# Patient Record
Sex: Female | Born: 1935 | Race: Black or African American | Hispanic: No | Smoking: Never smoker
Health system: Southern US, Community
[De-identification: ages and names within clinical notes are randomized; demographics above are authoritative.]

## PROBLEM LIST (undated history)

## (undated) DIAGNOSIS — K2210 Ulcer of esophagus without bleeding: Secondary | ICD-10-CM

## (undated) DIAGNOSIS — G0490 Encephalitis and encephalomyelitis, unspecified: Secondary | ICD-10-CM

## (undated) DIAGNOSIS — A419 Sepsis, unspecified organism: Secondary | ICD-10-CM

## (undated) DIAGNOSIS — K219 Gastro-esophageal reflux disease without esophagitis: Secondary | ICD-10-CM

## (undated) DIAGNOSIS — M1990 Unspecified osteoarthritis, unspecified site: Secondary | ICD-10-CM

## (undated) DIAGNOSIS — K316 Fistula of stomach and duodenum: Secondary | ICD-10-CM

## (undated) DIAGNOSIS — F32A Depression, unspecified: Secondary | ICD-10-CM

## (undated) DIAGNOSIS — A498 Other bacterial infections of unspecified site: Secondary | ICD-10-CM

## (undated) DIAGNOSIS — K209 Esophagitis, unspecified without bleeding: Secondary | ICD-10-CM

## (undated) DIAGNOSIS — R7881 Bacteremia: Secondary | ICD-10-CM

## (undated) DIAGNOSIS — N390 Urinary tract infection, site not specified: Secondary | ICD-10-CM

## (undated) DIAGNOSIS — E871 Hypo-osmolality and hyponatremia: Secondary | ICD-10-CM

## (undated) DIAGNOSIS — D649 Anemia, unspecified: Secondary | ICD-10-CM

## (undated) DIAGNOSIS — G4700 Insomnia, unspecified: Secondary | ICD-10-CM

## (undated) DIAGNOSIS — F0390 Unspecified dementia without behavioral disturbance: Secondary | ICD-10-CM

## (undated) DIAGNOSIS — Z9889 Other specified postprocedural states: Secondary | ICD-10-CM

## (undated) DIAGNOSIS — K449 Diaphragmatic hernia without obstruction or gangrene: Secondary | ICD-10-CM

## (undated) DIAGNOSIS — K859 Acute pancreatitis without necrosis or infection, unspecified: Secondary | ICD-10-CM

## (undated) DIAGNOSIS — J9690 Respiratory failure, unspecified, unspecified whether with hypoxia or hypercapnia: Secondary | ICD-10-CM

## (undated) DIAGNOSIS — K279 Peptic ulcer, site unspecified, unspecified as acute or chronic, without hemorrhage or perforation: Secondary | ICD-10-CM

## (undated) DIAGNOSIS — D1771 Benign lipomatous neoplasm of kidney: Secondary | ICD-10-CM

## (undated) DIAGNOSIS — L0390 Cellulitis, unspecified: Secondary | ICD-10-CM

## (undated) DIAGNOSIS — IMO0001 Reserved for inherently not codable concepts without codable children: Secondary | ICD-10-CM

## (undated) DIAGNOSIS — T148XXA Other injury of unspecified body region, initial encounter: Secondary | ICD-10-CM

## (undated) DIAGNOSIS — F329 Major depressive disorder, single episode, unspecified: Secondary | ICD-10-CM

## (undated) DIAGNOSIS — I10 Essential (primary) hypertension: Secondary | ICD-10-CM

## (undated) DIAGNOSIS — B9681 Helicobacter pylori [H. pylori] as the cause of diseases classified elsewhere: Secondary | ICD-10-CM

## (undated) DIAGNOSIS — M25521 Pain in right elbow: Secondary | ICD-10-CM

## (undated) HISTORY — DX: Reserved for inherently not codable concepts without codable children: IMO0001

## (undated) HISTORY — DX: Peptic ulcer, site unspecified, unspecified as acute or chronic, without hemorrhage or perforation: K27.9

## (undated) HISTORY — DX: Unspecified dementia, unspecified severity, without behavioral disturbance, psychotic disturbance, mood disturbance, and anxiety: F03.90

## (undated) HISTORY — DX: Fistula of stomach and duodenum: K31.6

## (undated) HISTORY — DX: Pain in right elbow: M25.521

## (undated) HISTORY — DX: Gastro-esophageal reflux disease without esophagitis: K21.9

## (undated) HISTORY — DX: Cellulitis, unspecified: L03.90

## (undated) HISTORY — DX: Helicobacter pylori (H. pylori) as the cause of diseases classified elsewhere: B96.81

## (undated) HISTORY — DX: Essential (primary) hypertension: I10

## (undated) HISTORY — PX: UMBILICAL HERNIA REPAIR: SHX196

---

## 1997-06-29 HISTORY — PX: OTHER SURGICAL HISTORY: SHX169

## 2001-01-10 ENCOUNTER — Emergency Department (HOSPITAL_COMMUNITY): Admission: EM | Admit: 2001-01-10 | Discharge: 2001-01-10 | Payer: Self-pay | Admitting: Emergency Medicine

## 2001-01-18 ENCOUNTER — Emergency Department (HOSPITAL_COMMUNITY): Admission: EM | Admit: 2001-01-18 | Discharge: 2001-01-18 | Payer: Self-pay | Admitting: Emergency Medicine

## 2001-01-26 ENCOUNTER — Encounter: Payer: Self-pay | Admitting: Emergency Medicine

## 2001-01-26 ENCOUNTER — Emergency Department (HOSPITAL_COMMUNITY): Admission: EM | Admit: 2001-01-26 | Discharge: 2001-01-26 | Payer: Self-pay | Admitting: Emergency Medicine

## 2001-01-29 ENCOUNTER — Encounter: Payer: Self-pay | Admitting: *Deleted

## 2001-01-29 ENCOUNTER — Emergency Department (HOSPITAL_COMMUNITY): Admission: EM | Admit: 2001-01-29 | Discharge: 2001-01-29 | Payer: Self-pay | Admitting: *Deleted

## 2001-02-12 ENCOUNTER — Emergency Department (HOSPITAL_COMMUNITY): Admission: EM | Admit: 2001-02-12 | Discharge: 2001-02-12 | Payer: Self-pay | Admitting: Emergency Medicine

## 2001-02-24 ENCOUNTER — Ambulatory Visit (HOSPITAL_COMMUNITY): Admission: RE | Admit: 2001-02-24 | Discharge: 2001-02-24 | Payer: Self-pay | Admitting: Family Medicine

## 2001-02-24 ENCOUNTER — Encounter: Payer: Self-pay | Admitting: Family Medicine

## 2001-03-06 ENCOUNTER — Encounter: Payer: Self-pay | Admitting: Family Medicine

## 2001-03-07 ENCOUNTER — Inpatient Hospital Stay (HOSPITAL_COMMUNITY): Admission: AD | Admit: 2001-03-07 | Discharge: 2001-03-09 | Payer: Self-pay | Admitting: Internal Medicine

## 2001-04-13 ENCOUNTER — Inpatient Hospital Stay (HOSPITAL_COMMUNITY): Admission: EM | Admit: 2001-04-13 | Discharge: 2001-05-02 | Payer: Self-pay | Admitting: *Deleted

## 2001-04-13 ENCOUNTER — Encounter: Payer: Self-pay | Admitting: *Deleted

## 2001-04-15 ENCOUNTER — Encounter: Payer: Self-pay | Admitting: Internal Medicine

## 2001-04-16 ENCOUNTER — Encounter: Payer: Self-pay | Admitting: Orthopedic Surgery

## 2001-04-17 ENCOUNTER — Encounter: Payer: Self-pay | Admitting: General Surgery

## 2001-04-19 ENCOUNTER — Encounter: Payer: Self-pay | Admitting: Family Medicine

## 2001-04-20 ENCOUNTER — Encounter: Payer: Self-pay | Admitting: Family Medicine

## 2001-04-21 ENCOUNTER — Encounter: Payer: Self-pay | Admitting: General Surgery

## 2001-04-22 ENCOUNTER — Encounter: Payer: Self-pay | Admitting: General Surgery

## 2001-04-24 ENCOUNTER — Encounter: Payer: Self-pay | Admitting: Internal Medicine

## 2001-04-26 ENCOUNTER — Encounter: Payer: Self-pay | Admitting: Family Medicine

## 2001-04-29 ENCOUNTER — Encounter: Payer: Self-pay | Admitting: Internal Medicine

## 2001-05-02 ENCOUNTER — Inpatient Hospital Stay: Admission: AD | Admit: 2001-05-02 | Discharge: 2001-05-13 | Payer: Self-pay | Admitting: Internal Medicine

## 2001-05-11 ENCOUNTER — Encounter: Payer: Self-pay | Admitting: Family Medicine

## 2001-05-12 ENCOUNTER — Encounter: Payer: Self-pay | Admitting: Family Medicine

## 2001-05-15 ENCOUNTER — Encounter: Payer: Self-pay | Admitting: Emergency Medicine

## 2001-05-15 ENCOUNTER — Emergency Department (HOSPITAL_COMMUNITY): Admission: EM | Admit: 2001-05-15 | Discharge: 2001-05-15 | Payer: Self-pay | Admitting: Emergency Medicine

## 2001-08-02 ENCOUNTER — Ambulatory Visit (HOSPITAL_COMMUNITY): Admission: RE | Admit: 2001-08-02 | Discharge: 2001-08-02 | Payer: Self-pay | Admitting: Orthopaedic Surgery

## 2001-08-02 ENCOUNTER — Encounter: Payer: Self-pay | Admitting: Orthopaedic Surgery

## 2001-10-24 ENCOUNTER — Emergency Department (HOSPITAL_COMMUNITY): Admission: EM | Admit: 2001-10-24 | Discharge: 2001-10-24 | Payer: Self-pay | Admitting: Emergency Medicine

## 2001-11-27 ENCOUNTER — Emergency Department (HOSPITAL_COMMUNITY): Admission: EM | Admit: 2001-11-27 | Discharge: 2001-11-27 | Payer: Self-pay | Admitting: Internal Medicine

## 2002-04-09 ENCOUNTER — Emergency Department (HOSPITAL_COMMUNITY): Admission: EM | Admit: 2002-04-09 | Discharge: 2002-04-09 | Payer: Self-pay | Admitting: *Deleted

## 2002-04-09 ENCOUNTER — Encounter: Payer: Self-pay | Admitting: *Deleted

## 2002-04-10 ENCOUNTER — Emergency Department (HOSPITAL_COMMUNITY): Admission: EM | Admit: 2002-04-10 | Discharge: 2002-04-10 | Payer: Self-pay | Admitting: *Deleted

## 2002-04-10 ENCOUNTER — Encounter: Payer: Self-pay | Admitting: *Deleted

## 2002-09-26 ENCOUNTER — Ambulatory Visit (HOSPITAL_COMMUNITY): Admission: RE | Admit: 2002-09-26 | Discharge: 2002-09-26 | Payer: Self-pay | Admitting: Family Medicine

## 2002-09-26 ENCOUNTER — Encounter: Payer: Self-pay | Admitting: Family Medicine

## 2002-10-31 ENCOUNTER — Encounter: Payer: Self-pay | Admitting: Family Medicine

## 2002-10-31 ENCOUNTER — Ambulatory Visit (HOSPITAL_COMMUNITY): Admission: RE | Admit: 2002-10-31 | Discharge: 2002-10-31 | Payer: Self-pay | Admitting: Family Medicine

## 2003-03-23 ENCOUNTER — Other Ambulatory Visit: Admission: RE | Admit: 2003-03-23 | Discharge: 2003-03-23 | Payer: Self-pay | Admitting: Obstetrics & Gynecology

## 2003-05-10 ENCOUNTER — Ambulatory Visit (HOSPITAL_COMMUNITY): Admission: RE | Admit: 2003-05-10 | Discharge: 2003-05-10 | Payer: Self-pay | Admitting: Obstetrics & Gynecology

## 2003-10-19 ENCOUNTER — Ambulatory Visit (HOSPITAL_COMMUNITY): Admission: RE | Admit: 2003-10-19 | Discharge: 2003-10-19 | Payer: Self-pay | Admitting: Family Medicine

## 2003-11-30 ENCOUNTER — Ambulatory Visit (HOSPITAL_COMMUNITY): Admission: RE | Admit: 2003-11-30 | Discharge: 2003-11-30 | Payer: Self-pay | Admitting: *Deleted

## 2004-01-01 ENCOUNTER — Ambulatory Visit (HOSPITAL_COMMUNITY): Admission: RE | Admit: 2004-01-01 | Discharge: 2004-01-01 | Payer: Self-pay | Admitting: Family Medicine

## 2004-01-08 ENCOUNTER — Ambulatory Visit (HOSPITAL_COMMUNITY): Admission: RE | Admit: 2004-01-08 | Discharge: 2004-01-08 | Payer: Self-pay | Admitting: *Deleted

## 2004-01-09 ENCOUNTER — Ambulatory Visit (HOSPITAL_COMMUNITY): Admission: RE | Admit: 2004-01-09 | Discharge: 2004-01-09 | Payer: Self-pay | Admitting: Family Medicine

## 2004-03-12 ENCOUNTER — Inpatient Hospital Stay (HOSPITAL_COMMUNITY): Admission: EM | Admit: 2004-03-12 | Discharge: 2004-03-13 | Payer: Self-pay | Admitting: *Deleted

## 2004-03-31 ENCOUNTER — Ambulatory Visit: Payer: Self-pay | Admitting: *Deleted

## 2004-04-30 ENCOUNTER — Ambulatory Visit: Payer: Self-pay | Admitting: Family Medicine

## 2004-05-07 ENCOUNTER — Ambulatory Visit: Payer: Self-pay | Admitting: Family Medicine

## 2004-08-01 ENCOUNTER — Ambulatory Visit: Payer: Self-pay | Admitting: Family Medicine

## 2004-08-18 ENCOUNTER — Ambulatory Visit (HOSPITAL_COMMUNITY): Admission: RE | Admit: 2004-08-18 | Discharge: 2004-08-18 | Payer: Self-pay | Admitting: Ophthalmology

## 2004-08-29 ENCOUNTER — Ambulatory Visit: Payer: Self-pay | Admitting: Family Medicine

## 2004-11-10 ENCOUNTER — Ambulatory Visit: Payer: Self-pay | Admitting: Family Medicine

## 2004-12-15 ENCOUNTER — Ambulatory Visit (HOSPITAL_COMMUNITY): Admission: RE | Admit: 2004-12-15 | Discharge: 2004-12-15 | Payer: Self-pay | Admitting: Ophthalmology

## 2005-01-01 ENCOUNTER — Ambulatory Visit: Payer: Self-pay | Admitting: Family Medicine

## 2005-01-09 ENCOUNTER — Ambulatory Visit (HOSPITAL_COMMUNITY): Admission: RE | Admit: 2005-01-09 | Discharge: 2005-01-09 | Payer: Self-pay | Admitting: Family Medicine

## 2005-03-09 ENCOUNTER — Ambulatory Visit: Payer: Self-pay | Admitting: Family Medicine

## 2005-04-16 ENCOUNTER — Ambulatory Visit: Payer: Self-pay | Admitting: Family Medicine

## 2005-04-30 ENCOUNTER — Ambulatory Visit: Payer: Self-pay | Admitting: Family Medicine

## 2005-07-29 ENCOUNTER — Ambulatory Visit: Payer: Self-pay | Admitting: Family Medicine

## 2005-08-07 ENCOUNTER — Ambulatory Visit (HOSPITAL_COMMUNITY): Admission: RE | Admit: 2005-08-07 | Discharge: 2005-08-07 | Payer: Self-pay | Admitting: Family Medicine

## 2005-08-21 ENCOUNTER — Other Ambulatory Visit: Admission: RE | Admit: 2005-08-21 | Discharge: 2005-08-21 | Payer: Self-pay | Admitting: Obstetrics & Gynecology

## 2005-08-26 ENCOUNTER — Ambulatory Visit: Payer: Self-pay | Admitting: Family Medicine

## 2005-11-26 ENCOUNTER — Ambulatory Visit: Payer: Self-pay | Admitting: Family Medicine

## 2005-12-02 ENCOUNTER — Ambulatory Visit: Payer: Self-pay | Admitting: Family Medicine

## 2006-01-07 ENCOUNTER — Ambulatory Visit: Payer: Self-pay | Admitting: Family Medicine

## 2006-01-28 ENCOUNTER — Ambulatory Visit (HOSPITAL_COMMUNITY): Admission: RE | Admit: 2006-01-28 | Discharge: 2006-01-28 | Payer: Self-pay | Admitting: Family Medicine

## 2006-04-01 ENCOUNTER — Ambulatory Visit: Payer: Self-pay | Admitting: Family Medicine

## 2006-05-04 ENCOUNTER — Other Ambulatory Visit: Admission: RE | Admit: 2006-05-04 | Discharge: 2006-05-04 | Payer: Self-pay | Admitting: Family Medicine

## 2006-05-04 ENCOUNTER — Ambulatory Visit: Payer: Self-pay | Admitting: Family Medicine

## 2006-05-04 ENCOUNTER — Encounter: Payer: Self-pay | Admitting: Family Medicine

## 2006-05-04 ENCOUNTER — Encounter (INDEPENDENT_AMBULATORY_CARE_PROVIDER_SITE_OTHER): Payer: Self-pay | Admitting: *Deleted

## 2006-05-04 LAB — CONVERTED CEMR LAB: Pap Smear: NORMAL

## 2006-06-03 ENCOUNTER — Ambulatory Visit: Payer: Self-pay | Admitting: Family Medicine

## 2006-09-02 ENCOUNTER — Ambulatory Visit: Payer: Self-pay | Admitting: Family Medicine

## 2006-09-02 LAB — CONVERTED CEMR LAB
BUN: 20 mg/dL (ref 6–23)
Calcium: 9.3 mg/dL (ref 8.4–10.5)
Creatinine, Ser: 1.25 mg/dL — ABNORMAL HIGH (ref 0.40–1.20)
Hgb A1c MFr Bld: 7.2 % — ABNORMAL HIGH (ref 4.6–6.1)

## 2006-09-06 ENCOUNTER — Encounter: Payer: Self-pay | Admitting: Family Medicine

## 2006-09-06 LAB — CONVERTED CEMR LAB: Potassium: 4.2 meq/L (ref 3.5–5.3)

## 2006-09-13 ENCOUNTER — Ambulatory Visit: Payer: Self-pay | Admitting: Family Medicine

## 2006-09-15 ENCOUNTER — Ambulatory Visit (HOSPITAL_COMMUNITY): Admission: RE | Admit: 2006-09-15 | Discharge: 2006-09-15 | Payer: Self-pay | Admitting: Family Medicine

## 2006-10-04 ENCOUNTER — Ambulatory Visit: Payer: Self-pay | Admitting: Family Medicine

## 2006-10-13 ENCOUNTER — Ambulatory Visit (HOSPITAL_COMMUNITY): Admission: RE | Admit: 2006-10-13 | Discharge: 2006-10-13 | Payer: Self-pay | Admitting: Family Medicine

## 2006-11-17 ENCOUNTER — Ambulatory Visit: Payer: Self-pay | Admitting: Family Medicine

## 2006-12-03 ENCOUNTER — Ambulatory Visit: Payer: Self-pay | Admitting: Family Medicine

## 2006-12-15 ENCOUNTER — Ambulatory Visit (HOSPITAL_COMMUNITY): Admission: RE | Admit: 2006-12-15 | Discharge: 2006-12-15 | Payer: Self-pay | Admitting: Orthopaedic Surgery

## 2007-01-03 ENCOUNTER — Ambulatory Visit: Payer: Self-pay | Admitting: Family Medicine

## 2007-01-04 ENCOUNTER — Encounter: Payer: Self-pay | Admitting: Family Medicine

## 2007-01-06 ENCOUNTER — Encounter: Payer: Self-pay | Admitting: Family Medicine

## 2007-01-06 LAB — CONVERTED CEMR LAB
Bilirubin, Direct: 0.1 mg/dL (ref 0.0–0.3)
Calcium: 9.2 mg/dL (ref 8.4–10.5)
Creatinine, Ser: 1.21 mg/dL — ABNORMAL HIGH (ref 0.40–1.20)
Indirect Bilirubin: 0.2 mg/dL (ref 0.0–0.9)
Total Bilirubin: 0.3 mg/dL (ref 0.3–1.2)
Total Protein: 6.8 g/dL (ref 6.0–8.3)

## 2007-03-01 ENCOUNTER — Ambulatory Visit (HOSPITAL_COMMUNITY): Admission: RE | Admit: 2007-03-01 | Discharge: 2007-03-01 | Payer: Self-pay | Admitting: Family Medicine

## 2007-04-14 ENCOUNTER — Ambulatory Visit: Payer: Self-pay | Admitting: Family Medicine

## 2007-05-09 ENCOUNTER — Ambulatory Visit: Payer: Self-pay | Admitting: Family Medicine

## 2007-05-11 ENCOUNTER — Ambulatory Visit (HOSPITAL_COMMUNITY): Admission: RE | Admit: 2007-05-11 | Discharge: 2007-05-11 | Payer: Self-pay | Admitting: Family Medicine

## 2007-05-24 ENCOUNTER — Encounter: Payer: Self-pay | Admitting: Family Medicine

## 2007-05-24 LAB — CONVERTED CEMR LAB
Alkaline Phosphatase: 57 units/L (ref 39–117)
BUN: 19 mg/dL (ref 6–23)
Basophils Relative: 0 % (ref 0–1)
Bilirubin, Direct: 0.1 mg/dL (ref 0.0–0.3)
CO2: 22 meq/L (ref 19–32)
Chloride: 105 meq/L (ref 96–112)
Creatinine, Ser: 1.02 mg/dL (ref 0.40–1.20)
Hemoglobin: 12 g/dL (ref 12.0–15.0)
Indirect Bilirubin: 0.2 mg/dL (ref 0.0–0.9)
LDL Cholesterol: 23 mg/dL (ref 0–99)
MCHC: 30.8 g/dL (ref 30.0–36.0)
Monocytes Absolute: 0.4 10*3/uL (ref 0.1–1.0)
Monocytes Relative: 8 % (ref 3–12)
Neutro Abs: 2 10*3/uL (ref 1.7–7.7)
RBC: 4.23 M/uL (ref 3.87–5.11)
Total Bilirubin: 0.3 mg/dL (ref 0.3–1.2)
Triglycerides: 109 mg/dL (ref <150)

## 2007-06-13 ENCOUNTER — Ambulatory Visit: Payer: Self-pay | Admitting: Family Medicine

## 2007-06-30 DIAGNOSIS — Z9889 Other specified postprocedural states: Secondary | ICD-10-CM

## 2007-06-30 HISTORY — DX: Other specified postprocedural states: Z98.890

## 2007-07-08 ENCOUNTER — Ambulatory Visit (HOSPITAL_COMMUNITY): Admission: RE | Admit: 2007-07-08 | Discharge: 2007-07-08 | Payer: Self-pay | Admitting: Cardiology

## 2007-07-08 ENCOUNTER — Ambulatory Visit: Payer: Self-pay | Admitting: Cardiology

## 2007-07-12 ENCOUNTER — Ambulatory Visit: Payer: Self-pay | Admitting: Family Medicine

## 2007-08-22 ENCOUNTER — Ambulatory Visit: Payer: Self-pay | Admitting: Cardiology

## 2007-08-22 ENCOUNTER — Ambulatory Visit (HOSPITAL_COMMUNITY): Admission: RE | Admit: 2007-08-22 | Discharge: 2007-08-22 | Payer: Self-pay | Admitting: Cardiology

## 2007-08-30 ENCOUNTER — Ambulatory Visit: Payer: Self-pay | Admitting: Family Medicine

## 2007-09-01 ENCOUNTER — Encounter: Payer: Self-pay | Admitting: Family Medicine

## 2007-09-01 LAB — CONVERTED CEMR LAB
Albumin: 4.1 g/dL (ref 3.5–5.2)
Bilirubin, Direct: <0.1 mg/dL (ref 0.0–0.3)
CO2: 20 meq/L (ref 19–32)
Chloride: 104 meq/L (ref 96–112)
Glucose, Bld: 178 mg/dL — ABNORMAL HIGH (ref 70–99)
Potassium: 5.3 meq/L (ref 3.5–5.3)
Sodium: 137 meq/L (ref 135–145)
TSH: 1.372 microintl units/mL (ref 0.350–5.50)
Total Bilirubin: 0.3 mg/dL (ref 0.3–1.2)
Total CHOL/HDL Ratio: 2.3
VLDL: 20 mg/dL (ref 0–40)

## 2007-09-12 ENCOUNTER — Ambulatory Visit: Payer: Self-pay | Admitting: Cardiology

## 2007-09-13 ENCOUNTER — Encounter (INDEPENDENT_AMBULATORY_CARE_PROVIDER_SITE_OTHER): Payer: Self-pay | Admitting: *Deleted

## 2007-09-13 DIAGNOSIS — F3289 Other specified depressive episodes: Secondary | ICD-10-CM | POA: Insufficient documentation

## 2007-09-13 DIAGNOSIS — Z87898 Personal history of other specified conditions: Secondary | ICD-10-CM

## 2007-09-13 DIAGNOSIS — G4700 Insomnia, unspecified: Secondary | ICD-10-CM

## 2007-09-13 DIAGNOSIS — F329 Major depressive disorder, single episode, unspecified: Secondary | ICD-10-CM

## 2007-09-13 DIAGNOSIS — J301 Allergic rhinitis due to pollen: Secondary | ICD-10-CM

## 2007-09-13 DIAGNOSIS — E119 Type 2 diabetes mellitus without complications: Secondary | ICD-10-CM

## 2007-09-13 DIAGNOSIS — I10 Essential (primary) hypertension: Secondary | ICD-10-CM | POA: Insufficient documentation

## 2007-09-13 DIAGNOSIS — E785 Hyperlipidemia, unspecified: Secondary | ICD-10-CM | POA: Insufficient documentation

## 2007-10-18 ENCOUNTER — Ambulatory Visit: Payer: Self-pay | Admitting: Family Medicine

## 2007-11-03 ENCOUNTER — Emergency Department (HOSPITAL_COMMUNITY): Admission: EM | Admit: 2007-11-03 | Discharge: 2007-11-03 | Payer: Self-pay | Admitting: Emergency Medicine

## 2007-11-07 ENCOUNTER — Ambulatory Visit: Payer: Self-pay | Admitting: Orthopedic Surgery

## 2007-11-07 DIAGNOSIS — S52023A Displaced fracture of olecranon process without intraarticular extension of unspecified ulna, initial encounter for closed fracture: Secondary | ICD-10-CM

## 2007-11-10 ENCOUNTER — Ambulatory Visit (HOSPITAL_COMMUNITY): Admission: RE | Admit: 2007-11-10 | Discharge: 2007-11-10 | Payer: Self-pay | Admitting: Orthopedic Surgery

## 2007-11-10 ENCOUNTER — Ambulatory Visit: Payer: Self-pay | Admitting: Orthopedic Surgery

## 2007-11-14 ENCOUNTER — Emergency Department (HOSPITAL_COMMUNITY): Admission: EM | Admit: 2007-11-14 | Discharge: 2007-11-14 | Payer: Self-pay | Admitting: Emergency Medicine

## 2007-11-15 ENCOUNTER — Ambulatory Visit: Payer: Self-pay | Admitting: Family Medicine

## 2007-11-16 ENCOUNTER — Ambulatory Visit: Payer: Self-pay | Admitting: Orthopedic Surgery

## 2007-11-19 ENCOUNTER — Emergency Department (HOSPITAL_COMMUNITY): Admission: EM | Admit: 2007-11-19 | Discharge: 2007-11-19 | Payer: Self-pay | Admitting: Emergency Medicine

## 2007-11-22 ENCOUNTER — Ambulatory Visit (HOSPITAL_COMMUNITY): Admission: RE | Admit: 2007-11-22 | Discharge: 2007-11-22 | Payer: Self-pay | Admitting: General Surgery

## 2007-11-24 ENCOUNTER — Ambulatory Visit: Payer: Self-pay | Admitting: Orthopedic Surgery

## 2007-11-25 ENCOUNTER — Emergency Department (HOSPITAL_COMMUNITY): Admission: EM | Admit: 2007-11-25 | Discharge: 2007-11-26 | Payer: Self-pay | Admitting: Emergency Medicine

## 2007-11-28 ENCOUNTER — Ambulatory Visit: Payer: Self-pay | Admitting: Family Medicine

## 2007-11-28 ENCOUNTER — Encounter: Payer: Self-pay | Admitting: Family Medicine

## 2007-11-28 DIAGNOSIS — R269 Unspecified abnormalities of gait and mobility: Secondary | ICD-10-CM

## 2007-11-28 DIAGNOSIS — K859 Acute pancreatitis without necrosis or infection, unspecified: Secondary | ICD-10-CM

## 2007-11-28 DIAGNOSIS — K5730 Diverticulosis of large intestine without perforation or abscess without bleeding: Secondary | ICD-10-CM | POA: Insufficient documentation

## 2007-11-28 HISTORY — DX: Acute pancreatitis without necrosis or infection, unspecified: K85.90

## 2007-11-29 ENCOUNTER — Encounter: Payer: Self-pay | Admitting: Orthopedic Surgery

## 2007-11-30 ENCOUNTER — Ambulatory Visit: Payer: Self-pay | Admitting: Orthopedic Surgery

## 2007-12-01 ENCOUNTER — Emergency Department (HOSPITAL_COMMUNITY): Admission: EM | Admit: 2007-12-01 | Discharge: 2007-12-01 | Payer: Self-pay | Admitting: Emergency Medicine

## 2007-12-02 ENCOUNTER — Other Ambulatory Visit: Payer: Self-pay | Admitting: Orthopedic Surgery

## 2007-12-02 ENCOUNTER — Inpatient Hospital Stay (HOSPITAL_COMMUNITY): Admission: EM | Admit: 2007-12-02 | Discharge: 2007-12-12 | Payer: Self-pay | Admitting: Emergency Medicine

## 2007-12-02 ENCOUNTER — Emergency Department (HOSPITAL_COMMUNITY): Admission: EM | Admit: 2007-12-02 | Discharge: 2007-12-02 | Payer: Self-pay | Admitting: Emergency Medicine

## 2007-12-04 ENCOUNTER — Ambulatory Visit: Payer: Self-pay | Admitting: Internal Medicine

## 2007-12-05 ENCOUNTER — Ambulatory Visit: Payer: Self-pay | Admitting: Internal Medicine

## 2007-12-05 ENCOUNTER — Encounter: Payer: Self-pay | Admitting: Internal Medicine

## 2007-12-05 DIAGNOSIS — K2210 Ulcer of esophagus without bleeding: Secondary | ICD-10-CM

## 2007-12-05 HISTORY — DX: Ulcer of esophagus without bleeding: K22.10

## 2007-12-06 ENCOUNTER — Ambulatory Visit: Payer: Self-pay | Admitting: Internal Medicine

## 2007-12-08 ENCOUNTER — Telehealth: Payer: Self-pay | Admitting: Orthopedic Surgery

## 2007-12-08 ENCOUNTER — Encounter: Payer: Self-pay | Admitting: Orthopedic Surgery

## 2007-12-08 ENCOUNTER — Ambulatory Visit: Payer: Self-pay | Admitting: Family Medicine

## 2007-12-09 ENCOUNTER — Ambulatory Visit: Payer: Self-pay | Admitting: Orthopedic Surgery

## 2007-12-12 ENCOUNTER — Encounter: Payer: Self-pay | Admitting: Orthopedic Surgery

## 2007-12-21 ENCOUNTER — Ambulatory Visit: Payer: Self-pay | Admitting: Orthopedic Surgery

## 2007-12-21 ENCOUNTER — Ambulatory Visit: Payer: Self-pay | Admitting: Family Medicine

## 2008-01-02 ENCOUNTER — Telehealth: Payer: Self-pay | Admitting: Orthopedic Surgery

## 2008-01-18 ENCOUNTER — Ambulatory Visit: Payer: Self-pay | Admitting: Orthopedic Surgery

## 2008-01-20 ENCOUNTER — Telehealth: Payer: Self-pay | Admitting: Orthopedic Surgery

## 2008-01-23 ENCOUNTER — Telehealth: Payer: Self-pay | Admitting: Family Medicine

## 2008-01-27 ENCOUNTER — Ambulatory Visit: Payer: Self-pay | Admitting: Family Medicine

## 2008-01-30 ENCOUNTER — Telehealth: Payer: Self-pay | Admitting: Orthopedic Surgery

## 2008-01-31 ENCOUNTER — Encounter: Payer: Self-pay | Admitting: Orthopedic Surgery

## 2008-01-31 ENCOUNTER — Telehealth: Payer: Self-pay | Admitting: Family Medicine

## 2008-02-07 ENCOUNTER — Telehealth: Payer: Self-pay | Admitting: Family Medicine

## 2008-02-11 ENCOUNTER — Emergency Department (HOSPITAL_COMMUNITY): Admission: EM | Admit: 2008-02-11 | Discharge: 2008-02-11 | Payer: Self-pay | Admitting: Emergency Medicine

## 2008-02-13 ENCOUNTER — Emergency Department (HOSPITAL_COMMUNITY): Admission: EM | Admit: 2008-02-13 | Discharge: 2008-02-13 | Payer: Self-pay | Admitting: Emergency Medicine

## 2008-02-14 ENCOUNTER — Ambulatory Visit: Payer: Self-pay | Admitting: Family Medicine

## 2008-02-14 ENCOUNTER — Inpatient Hospital Stay (HOSPITAL_COMMUNITY): Admission: EM | Admit: 2008-02-14 | Discharge: 2008-02-19 | Payer: Self-pay | Admitting: Emergency Medicine

## 2008-02-16 ENCOUNTER — Ambulatory Visit: Payer: Self-pay | Admitting: Gastroenterology

## 2008-02-20 ENCOUNTER — Encounter: Payer: Self-pay | Admitting: Family Medicine

## 2008-02-21 ENCOUNTER — Encounter: Payer: Self-pay | Admitting: Family Medicine

## 2008-02-22 ENCOUNTER — Ambulatory Visit: Payer: Self-pay | Admitting: Family Medicine

## 2008-02-22 LAB — CONVERTED CEMR LAB
Glucose, Bld: 266 mg/dL
Hgb A1c MFr Bld: 6.2 %

## 2008-02-24 ENCOUNTER — Encounter: Payer: Self-pay | Admitting: Family Medicine

## 2008-02-27 ENCOUNTER — Encounter: Payer: Self-pay | Admitting: Family Medicine

## 2008-02-28 ENCOUNTER — Encounter: Payer: Self-pay | Admitting: Family Medicine

## 2008-02-29 ENCOUNTER — Telehealth: Payer: Self-pay | Admitting: Family Medicine

## 2008-02-29 ENCOUNTER — Encounter: Payer: Self-pay | Admitting: Family Medicine

## 2008-03-01 ENCOUNTER — Telehealth: Payer: Self-pay | Admitting: Family Medicine

## 2008-03-02 ENCOUNTER — Emergency Department (HOSPITAL_COMMUNITY): Admission: EM | Admit: 2008-03-02 | Discharge: 2008-03-02 | Payer: Self-pay | Admitting: Emergency Medicine

## 2008-03-06 ENCOUNTER — Telehealth: Payer: Self-pay | Admitting: Family Medicine

## 2008-03-07 ENCOUNTER — Encounter: Payer: Self-pay | Admitting: Family Medicine

## 2008-03-13 ENCOUNTER — Encounter: Payer: Self-pay | Admitting: Family Medicine

## 2008-03-15 ENCOUNTER — Telehealth: Payer: Self-pay | Admitting: Family Medicine

## 2008-03-15 ENCOUNTER — Encounter: Payer: Self-pay | Admitting: Family Medicine

## 2008-03-16 ENCOUNTER — Ambulatory Visit: Payer: Self-pay | Admitting: Cardiology

## 2008-03-16 ENCOUNTER — Encounter: Payer: Self-pay | Admitting: Family Medicine

## 2008-03-23 ENCOUNTER — Encounter: Payer: Self-pay | Admitting: Family Medicine

## 2008-03-26 ENCOUNTER — Encounter: Payer: Self-pay | Admitting: Family Medicine

## 2008-03-28 ENCOUNTER — Ambulatory Visit: Payer: Self-pay | Admitting: Orthopedic Surgery

## 2008-03-29 ENCOUNTER — Telehealth: Payer: Self-pay | Admitting: Family Medicine

## 2008-03-29 ENCOUNTER — Ambulatory Visit (HOSPITAL_COMMUNITY): Admission: RE | Admit: 2008-03-29 | Discharge: 2008-03-29 | Payer: Self-pay | Admitting: Family Medicine

## 2008-04-17 ENCOUNTER — Encounter: Payer: Self-pay | Admitting: Family Medicine

## 2008-04-22 ENCOUNTER — Emergency Department (HOSPITAL_COMMUNITY): Admission: EM | Admit: 2008-04-22 | Discharge: 2008-04-22 | Payer: Self-pay | Admitting: Emergency Medicine

## 2008-04-23 ENCOUNTER — Ambulatory Visit: Payer: Self-pay | Admitting: Family Medicine

## 2008-04-23 DIAGNOSIS — N3000 Acute cystitis without hematuria: Secondary | ICD-10-CM

## 2008-04-24 ENCOUNTER — Encounter: Payer: Self-pay | Admitting: Family Medicine

## 2008-05-01 ENCOUNTER — Encounter: Payer: Self-pay | Admitting: Family Medicine

## 2008-05-03 ENCOUNTER — Ambulatory Visit: Payer: Self-pay | Admitting: Family Medicine

## 2008-05-15 ENCOUNTER — Encounter: Payer: Self-pay | Admitting: Family Medicine

## 2008-05-23 ENCOUNTER — Ambulatory Visit: Payer: Self-pay | Admitting: Cardiology

## 2008-05-30 ENCOUNTER — Telehealth: Payer: Self-pay | Admitting: Family Medicine

## 2008-05-31 ENCOUNTER — Ambulatory Visit: Payer: Self-pay | Admitting: Family Medicine

## 2008-05-31 LAB — CONVERTED CEMR LAB
Nitrite: NEGATIVE
Protein, U semiquant: NEGATIVE
Specific Gravity, Urine: 1.015
Urobilinogen, UA: 0.2

## 2008-06-01 ENCOUNTER — Encounter: Payer: Self-pay | Admitting: Family Medicine

## 2008-06-01 LAB — CONVERTED CEMR LAB
Bilirubin Urine: NEGATIVE
Leukocytes, UA: NEGATIVE
Protein, ur: NEGATIVE mg/dL
Urine Glucose: NEGATIVE mg/dL
pH: 6 (ref 5.0–8.0)

## 2008-06-05 ENCOUNTER — Encounter: Payer: Self-pay | Admitting: Family Medicine

## 2008-06-05 ENCOUNTER — Telehealth: Payer: Self-pay | Admitting: Family Medicine

## 2008-06-15 ENCOUNTER — Encounter: Payer: Self-pay | Admitting: Family Medicine

## 2008-06-19 ENCOUNTER — Encounter: Payer: Self-pay | Admitting: Family Medicine

## 2008-06-25 ENCOUNTER — Encounter: Payer: Self-pay | Admitting: Family Medicine

## 2008-06-28 ENCOUNTER — Ambulatory Visit: Payer: Self-pay | Admitting: Family Medicine

## 2008-07-02 ENCOUNTER — Encounter: Payer: Self-pay | Admitting: Family Medicine

## 2008-07-10 ENCOUNTER — Ambulatory Visit: Payer: Self-pay | Admitting: Family Medicine

## 2008-07-10 LAB — CONVERTED CEMR LAB
Bilirubin Urine: NEGATIVE
Glucose, Bld: 327 mg/dL
Glucose, Urine, Semiquant: 500
Hgb A1c MFr Bld: 6.6 %
Specific Gravity, Urine: 1.015
Urobilinogen, UA: 0.2
pH: 6

## 2008-07-11 ENCOUNTER — Encounter: Payer: Self-pay | Admitting: Family Medicine

## 2008-07-11 LAB — CONVERTED CEMR LAB
Creatinine, Urine: 119.5 mg/dL
Microalb, Ur: 4.4 mg/dL — ABNORMAL HIGH (ref 0.00–1.89)

## 2008-07-12 ENCOUNTER — Encounter: Payer: Self-pay | Admitting: Family Medicine

## 2008-07-12 DIAGNOSIS — N39498 Other specified urinary incontinence: Secondary | ICD-10-CM

## 2008-07-16 LAB — CONVERTED CEMR LAB
ALT: 9 units/L (ref 0–35)
Albumin: 4.4 g/dL (ref 3.5–5.2)
Alkaline Phosphatase: 103 units/L (ref 39–117)
BUN: 14 mg/dL (ref 6–23)
Basophils Absolute: 0 10*3/uL (ref 0.0–0.1)
Basophils Relative: 1 % (ref 0–1)
Chloride: 102 meq/L (ref 96–112)
Cholesterol: 206 mg/dL — ABNORMAL HIGH (ref 0–200)
Eosinophils Absolute: 0.1 10*3/uL (ref 0.0–0.7)
HDL: 61 mg/dL (ref >39)
LDL Cholesterol: 123 mg/dL — ABNORMAL HIGH (ref 0–99)
MCHC: 30.6 g/dL (ref 30.0–36.0)
MCV: 93.8 fL (ref 78.0–100.0)
Monocytes Relative: 6 % (ref 3–12)
Neutro Abs: 1.9 10*3/uL (ref 1.7–7.7)
Neutrophils Relative %: 46 % (ref 43–77)
Platelets: 303 10*3/uL (ref 150–400)
Potassium: 4.8 meq/L (ref 3.5–5.3)
RDW: 13.7 % (ref 11.5–15.5)
Total Protein: 7.2 g/dL (ref 6.0–8.3)
Triglycerides: 111 mg/dL (ref <150)
VLDL: 22 mg/dL (ref 0–40)

## 2008-07-19 ENCOUNTER — Encounter: Payer: Self-pay | Admitting: Family Medicine

## 2008-07-21 ENCOUNTER — Emergency Department (HOSPITAL_COMMUNITY): Admission: EM | Admit: 2008-07-21 | Discharge: 2008-07-21 | Payer: Self-pay | Admitting: Emergency Medicine

## 2008-07-23 ENCOUNTER — Telehealth: Payer: Self-pay | Admitting: Family Medicine

## 2008-07-26 ENCOUNTER — Ambulatory Visit: Payer: Self-pay | Admitting: Family Medicine

## 2008-07-31 ENCOUNTER — Telehealth: Payer: Self-pay | Admitting: Family Medicine

## 2008-09-04 ENCOUNTER — Ambulatory Visit: Payer: Self-pay | Admitting: Family Medicine

## 2008-09-05 ENCOUNTER — Encounter: Payer: Self-pay | Admitting: Family Medicine

## 2008-09-26 ENCOUNTER — Ambulatory Visit: Payer: Self-pay | Admitting: Family Medicine

## 2008-10-12 ENCOUNTER — Emergency Department (HOSPITAL_COMMUNITY): Admission: EM | Admit: 2008-10-12 | Discharge: 2008-10-12 | Payer: Self-pay | Admitting: Emergency Medicine

## 2008-10-15 ENCOUNTER — Ambulatory Visit: Payer: Self-pay | Admitting: Family Medicine

## 2008-10-15 LAB — CONVERTED CEMR LAB: Hgb A1c MFr Bld: 9.1 %

## 2008-10-16 ENCOUNTER — Encounter: Payer: Self-pay | Admitting: Family Medicine

## 2008-10-17 ENCOUNTER — Emergency Department (HOSPITAL_COMMUNITY): Admission: EM | Admit: 2008-10-17 | Discharge: 2008-10-17 | Payer: Self-pay | Admitting: Emergency Medicine

## 2008-10-17 ENCOUNTER — Telehealth: Payer: Self-pay | Admitting: Family Medicine

## 2008-10-18 ENCOUNTER — Telehealth: Payer: Self-pay | Admitting: Family Medicine

## 2008-10-20 ENCOUNTER — Encounter: Payer: Self-pay | Admitting: Family Medicine

## 2008-10-20 ENCOUNTER — Inpatient Hospital Stay (HOSPITAL_COMMUNITY): Admission: EM | Admit: 2008-10-20 | Discharge: 2008-10-24 | Payer: Self-pay | Admitting: Emergency Medicine

## 2008-10-29 ENCOUNTER — Telehealth: Payer: Self-pay | Admitting: Family Medicine

## 2008-10-29 ENCOUNTER — Encounter: Payer: Self-pay | Admitting: Family Medicine

## 2008-11-08 ENCOUNTER — Telehealth: Payer: Self-pay | Admitting: Family Medicine

## 2008-11-09 ENCOUNTER — Telehealth: Payer: Self-pay | Admitting: Family Medicine

## 2008-11-14 ENCOUNTER — Encounter: Payer: Self-pay | Admitting: Family Medicine

## 2008-11-19 ENCOUNTER — Telehealth: Payer: Self-pay | Admitting: Family Medicine

## 2008-11-27 ENCOUNTER — Encounter: Payer: Self-pay | Admitting: Family Medicine

## 2008-11-28 ENCOUNTER — Telehealth: Payer: Self-pay | Admitting: Family Medicine

## 2008-12-04 ENCOUNTER — Encounter: Payer: Self-pay | Admitting: Family Medicine

## 2008-12-10 ENCOUNTER — Other Ambulatory Visit: Admission: RE | Admit: 2008-12-10 | Discharge: 2008-12-10 | Payer: Self-pay | Admitting: Family Medicine

## 2008-12-10 ENCOUNTER — Encounter: Payer: Self-pay | Admitting: Family Medicine

## 2008-12-10 ENCOUNTER — Ambulatory Visit: Payer: Self-pay | Admitting: Family Medicine

## 2008-12-10 DIAGNOSIS — R519 Headache, unspecified: Secondary | ICD-10-CM | POA: Insufficient documentation

## 2008-12-10 DIAGNOSIS — R51 Headache: Secondary | ICD-10-CM

## 2008-12-19 ENCOUNTER — Telehealth: Payer: Self-pay | Admitting: Family Medicine

## 2008-12-28 ENCOUNTER — Ambulatory Visit: Payer: Self-pay | Admitting: Family Medicine

## 2008-12-31 ENCOUNTER — Emergency Department (HOSPITAL_COMMUNITY): Admission: EM | Admit: 2008-12-31 | Discharge: 2008-12-31 | Payer: Self-pay | Admitting: Podiatry

## 2009-01-17 ENCOUNTER — Ambulatory Visit: Payer: Self-pay | Admitting: Family Medicine

## 2009-01-17 LAB — CONVERTED CEMR LAB
Glucose, Bld: 108 mg/dL
Hgb A1c MFr Bld: 7.8 %

## 2009-01-21 ENCOUNTER — Telehealth: Payer: Self-pay | Admitting: Family Medicine

## 2009-01-25 ENCOUNTER — Encounter: Payer: Self-pay | Admitting: Family Medicine

## 2009-01-27 DIAGNOSIS — E669 Obesity, unspecified: Secondary | ICD-10-CM

## 2009-02-08 ENCOUNTER — Ambulatory Visit (HOSPITAL_COMMUNITY): Admission: RE | Admit: 2009-02-08 | Discharge: 2009-02-08 | Payer: Self-pay | Admitting: Urology

## 2009-02-08 ENCOUNTER — Encounter: Payer: Self-pay | Admitting: Family Medicine

## 2009-02-18 ENCOUNTER — Encounter: Payer: Self-pay | Admitting: Family Medicine

## 2009-02-20 ENCOUNTER — Telehealth: Payer: Self-pay | Admitting: Family Medicine

## 2009-02-27 ENCOUNTER — Encounter: Payer: Self-pay | Admitting: Family Medicine

## 2009-02-28 ENCOUNTER — Encounter: Payer: Self-pay | Admitting: Family Medicine

## 2009-03-01 ENCOUNTER — Ambulatory Visit (HOSPITAL_COMMUNITY): Admission: RE | Admit: 2009-03-01 | Discharge: 2009-03-01 | Payer: Self-pay | Admitting: Urology

## 2009-03-06 ENCOUNTER — Encounter: Payer: Self-pay | Admitting: Family Medicine

## 2009-03-07 ENCOUNTER — Encounter: Payer: Self-pay | Admitting: Family Medicine

## 2009-03-12 ENCOUNTER — Ambulatory Visit: Payer: Self-pay | Admitting: Family Medicine

## 2009-03-12 ENCOUNTER — Telehealth: Payer: Self-pay | Admitting: Family Medicine

## 2009-03-21 ENCOUNTER — Emergency Department (HOSPITAL_COMMUNITY): Admission: EM | Admit: 2009-03-21 | Discharge: 2009-03-21 | Payer: Self-pay | Admitting: Emergency Medicine

## 2009-03-23 ENCOUNTER — Inpatient Hospital Stay (HOSPITAL_COMMUNITY): Admission: EM | Admit: 2009-03-23 | Discharge: 2009-03-27 | Payer: Self-pay | Admitting: Emergency Medicine

## 2009-03-23 ENCOUNTER — Encounter: Payer: Self-pay | Admitting: Orthopedic Surgery

## 2009-03-27 ENCOUNTER — Emergency Department (HOSPITAL_COMMUNITY): Admission: EM | Admit: 2009-03-27 | Discharge: 2009-03-28 | Payer: Self-pay | Admitting: Emergency Medicine

## 2009-03-28 ENCOUNTER — Telehealth: Payer: Self-pay | Admitting: Family Medicine

## 2009-03-30 ENCOUNTER — Ambulatory Visit: Payer: Self-pay | Admitting: Cardiology

## 2009-03-30 ENCOUNTER — Observation Stay (HOSPITAL_COMMUNITY): Admission: EM | Admit: 2009-03-30 | Discharge: 2009-04-01 | Payer: Self-pay | Admitting: Emergency Medicine

## 2009-04-01 ENCOUNTER — Encounter (INDEPENDENT_AMBULATORY_CARE_PROVIDER_SITE_OTHER): Payer: Self-pay | Admitting: Family Medicine

## 2009-04-02 ENCOUNTER — Encounter: Payer: Self-pay | Admitting: Family Medicine

## 2009-04-02 ENCOUNTER — Telehealth: Payer: Self-pay | Admitting: Family Medicine

## 2009-04-03 ENCOUNTER — Encounter: Payer: Self-pay | Admitting: Family Medicine

## 2009-04-04 ENCOUNTER — Ambulatory Visit: Payer: Self-pay | Admitting: Orthopedic Surgery

## 2009-04-04 DIAGNOSIS — T148XXA Other injury of unspecified body region, initial encounter: Secondary | ICD-10-CM | POA: Insufficient documentation

## 2009-04-05 ENCOUNTER — Encounter: Payer: Self-pay | Admitting: Family Medicine

## 2009-04-05 ENCOUNTER — Telehealth: Payer: Self-pay | Admitting: Family Medicine

## 2009-04-09 ENCOUNTER — Telehealth: Payer: Self-pay | Admitting: Family Medicine

## 2009-04-12 ENCOUNTER — Telehealth: Payer: Self-pay | Admitting: Orthopedic Surgery

## 2009-04-15 ENCOUNTER — Telehealth: Payer: Self-pay | Admitting: Orthopedic Surgery

## 2009-04-16 ENCOUNTER — Emergency Department (HOSPITAL_COMMUNITY): Admission: EM | Admit: 2009-04-16 | Discharge: 2009-04-16 | Payer: Self-pay | Admitting: Emergency Medicine

## 2009-04-16 ENCOUNTER — Encounter: Payer: Self-pay | Admitting: Family Medicine

## 2009-04-18 ENCOUNTER — Telehealth: Payer: Self-pay | Admitting: Family Medicine

## 2009-04-18 ENCOUNTER — Ambulatory Visit: Payer: Self-pay | Admitting: Family Medicine

## 2009-04-19 ENCOUNTER — Telehealth: Payer: Self-pay | Admitting: Family Medicine

## 2009-04-29 ENCOUNTER — Encounter: Payer: Self-pay | Admitting: Family Medicine

## 2009-04-30 ENCOUNTER — Telehealth: Payer: Self-pay | Admitting: Family Medicine

## 2009-05-01 ENCOUNTER — Ambulatory Visit: Payer: Self-pay | Admitting: Family Medicine

## 2009-05-02 ENCOUNTER — Encounter: Payer: Self-pay | Admitting: Family Medicine

## 2009-05-09 ENCOUNTER — Ambulatory Visit: Payer: Self-pay | Admitting: Orthopedic Surgery

## 2009-05-13 ENCOUNTER — Encounter: Payer: Self-pay | Admitting: Family Medicine

## 2009-05-27 ENCOUNTER — Encounter: Payer: Self-pay | Admitting: Family Medicine

## 2009-05-28 ENCOUNTER — Telehealth: Payer: Self-pay | Admitting: Family Medicine

## 2009-05-29 ENCOUNTER — Telehealth: Payer: Self-pay | Admitting: Family Medicine

## 2009-05-29 ENCOUNTER — Encounter: Payer: Self-pay | Admitting: Family Medicine

## 2009-05-31 ENCOUNTER — Telehealth: Payer: Self-pay | Admitting: Family Medicine

## 2009-06-06 ENCOUNTER — Telehealth: Payer: Self-pay | Admitting: Family Medicine

## 2009-06-06 ENCOUNTER — Emergency Department (HOSPITAL_COMMUNITY): Admission: EM | Admit: 2009-06-06 | Discharge: 2009-06-06 | Payer: Self-pay | Admitting: Emergency Medicine

## 2009-06-07 ENCOUNTER — Ambulatory Visit: Payer: Self-pay | Admitting: Family Medicine

## 2009-06-08 ENCOUNTER — Emergency Department (HOSPITAL_COMMUNITY): Admission: EM | Admit: 2009-06-08 | Discharge: 2009-06-08 | Payer: Self-pay | Admitting: Emergency Medicine

## 2009-06-10 ENCOUNTER — Emergency Department (HOSPITAL_COMMUNITY): Admission: EM | Admit: 2009-06-10 | Discharge: 2009-06-10 | Payer: Self-pay | Admitting: Emergency Medicine

## 2009-06-10 ENCOUNTER — Telehealth: Payer: Self-pay | Admitting: Family Medicine

## 2009-06-12 ENCOUNTER — Telehealth: Payer: Self-pay | Admitting: Family Medicine

## 2009-06-17 ENCOUNTER — Ambulatory Visit: Payer: Self-pay | Admitting: Family Medicine

## 2009-06-17 ENCOUNTER — Telehealth: Payer: Self-pay | Admitting: Family Medicine

## 2009-06-17 LAB — CONVERTED CEMR LAB: Glucose, Bld: 223 mg/dL

## 2009-06-18 ENCOUNTER — Ambulatory Visit (HOSPITAL_COMMUNITY): Admission: RE | Admit: 2009-06-18 | Discharge: 2009-06-18 | Payer: Self-pay | Admitting: Family Medicine

## 2009-06-27 ENCOUNTER — Ambulatory Visit: Payer: Self-pay | Admitting: Family Medicine

## 2009-07-01 ENCOUNTER — Encounter: Payer: Self-pay | Admitting: Family Medicine

## 2009-07-10 ENCOUNTER — Telehealth: Payer: Self-pay | Admitting: Family Medicine

## 2009-07-16 ENCOUNTER — Ambulatory Visit: Payer: Self-pay | Admitting: Family Medicine

## 2009-07-16 DIAGNOSIS — R5381 Other malaise: Secondary | ICD-10-CM

## 2009-07-16 DIAGNOSIS — R5383 Other fatigue: Secondary | ICD-10-CM

## 2009-07-16 LAB — CONVERTED CEMR LAB
Glucose, Bld: 171 mg/dL
Hgb A1c MFr Bld: 9.1 %

## 2009-07-19 ENCOUNTER — Encounter: Payer: Self-pay | Admitting: Family Medicine

## 2009-07-22 ENCOUNTER — Ambulatory Visit (HOSPITAL_COMMUNITY): Admission: RE | Admit: 2009-07-22 | Discharge: 2009-07-22 | Payer: Self-pay | Admitting: Family Medicine

## 2009-07-22 LAB — CONVERTED CEMR LAB
AST: 12 units/L (ref 0–37)
Albumin: 4.4 g/dL (ref 3.5–5.2)
Alkaline Phosphatase: 95 units/L (ref 39–117)
BUN: 6 mg/dL (ref 6–23)
Bilirubin, Direct: 0.1 mg/dL (ref 0.0–0.3)
CO2: 19 meq/L (ref 19–32)
Calcium: 9.7 mg/dL (ref 8.4–10.5)
Chloride: 103 meq/L (ref 96–112)
Creatinine, Ser: 0.7 mg/dL (ref 0.40–1.20)
Glucose, Bld: 201 mg/dL — ABNORMAL HIGH (ref 70–99)
Indirect Bilirubin: 0.1 mg/dL (ref 0.0–0.9)
LDL Cholesterol: 104 mg/dL — ABNORMAL HIGH (ref 0–99)
Total Bilirubin: 0.2 mg/dL — ABNORMAL LOW (ref 0.3–1.2)

## 2009-07-23 ENCOUNTER — Telehealth: Payer: Self-pay | Admitting: Family Medicine

## 2009-07-24 ENCOUNTER — Telehealth: Payer: Self-pay | Admitting: Family Medicine

## 2009-07-26 ENCOUNTER — Telehealth: Payer: Self-pay | Admitting: Family Medicine

## 2009-08-12 ENCOUNTER — Telehealth: Payer: Self-pay | Admitting: Family Medicine

## 2009-09-02 ENCOUNTER — Ambulatory Visit: Payer: Self-pay | Admitting: Family Medicine

## 2009-09-04 ENCOUNTER — Telehealth: Payer: Self-pay | Admitting: Family Medicine

## 2009-09-25 ENCOUNTER — Encounter: Payer: Self-pay | Admitting: Family Medicine

## 2009-10-07 ENCOUNTER — Encounter: Payer: Self-pay | Admitting: Family Medicine

## 2009-10-22 ENCOUNTER — Ambulatory Visit: Payer: Self-pay | Admitting: Family Medicine

## 2009-11-18 ENCOUNTER — Telehealth: Payer: Self-pay | Admitting: Family Medicine

## 2009-11-20 ENCOUNTER — Telehealth: Payer: Self-pay | Admitting: Family Medicine

## 2009-11-21 ENCOUNTER — Encounter: Payer: Self-pay | Admitting: Family Medicine

## 2009-11-22 ENCOUNTER — Ambulatory Visit: Payer: Self-pay | Admitting: Family Medicine

## 2009-11-26 ENCOUNTER — Telehealth: Payer: Self-pay | Admitting: Family Medicine

## 2009-11-26 ENCOUNTER — Encounter: Payer: Self-pay | Admitting: Family Medicine

## 2009-12-09 ENCOUNTER — Ambulatory Visit: Payer: Self-pay | Admitting: Family Medicine

## 2009-12-10 LAB — CONVERTED CEMR LAB
AST: 14 units/L (ref 0–37)
Albumin: 4.2 g/dL (ref 3.5–5.2)
Alkaline Phosphatase: 85 units/L (ref 39–117)
BUN: 11 mg/dL (ref 6–23)
Basophils Relative: 0 % (ref 0–1)
CO2: 18 meq/L — ABNORMAL LOW (ref 19–32)
Calcium: 9.3 mg/dL (ref 8.4–10.5)
Eosinophils Relative: 1 % (ref 0–5)
Glucose, Bld: 229 mg/dL — ABNORMAL HIGH (ref 70–99)
HCT: 37.6 % (ref 36.0–46.0)
Hemoglobin: 11.7 g/dL — ABNORMAL LOW (ref 12.0–15.0)
MCHC: 31.1 g/dL (ref 30.0–36.0)
MCV: 84.7 fL (ref 78.0–100.0)
Monocytes Absolute: 0.3 10*3/uL (ref 0.1–1.0)
Monocytes Relative: 6 % (ref 3–12)
Neutro Abs: 3.5 10*3/uL (ref 1.7–7.7)
RBC: 4.44 M/uL (ref 3.87–5.11)
Sodium: 137 meq/L (ref 135–145)
Total Protein: 7 g/dL (ref 6.0–8.3)

## 2010-01-09 ENCOUNTER — Ambulatory Visit: Payer: Self-pay | Admitting: Family Medicine

## 2010-01-17 ENCOUNTER — Encounter: Payer: Self-pay | Admitting: Family Medicine

## 2010-01-28 ENCOUNTER — Encounter: Payer: Self-pay | Admitting: Family Medicine

## 2010-02-04 ENCOUNTER — Encounter: Payer: Self-pay | Admitting: Family Medicine

## 2010-02-06 ENCOUNTER — Telehealth: Payer: Self-pay | Admitting: Family Medicine

## 2010-02-14 ENCOUNTER — Ambulatory Visit: Payer: Self-pay | Admitting: Family Medicine

## 2010-03-11 ENCOUNTER — Ambulatory Visit: Payer: Self-pay | Admitting: Family Medicine

## 2010-03-12 ENCOUNTER — Telehealth: Payer: Self-pay | Admitting: Family Medicine

## 2010-03-12 LAB — CONVERTED CEMR LAB
ALT: <8 units/L (ref 0–35)
Alkaline Phosphatase: 74 units/L (ref 39–117)
BUN: 9 mg/dL (ref 6–23)
Cholesterol: 125 mg/dL (ref 0–200)
Creatinine, Ser: 0.8 mg/dL (ref 0.40–1.20)
Potassium: 4.8 meq/L (ref 3.5–5.3)
Total Protein: 7 g/dL (ref 6.0–8.3)
Triglycerides: 91 mg/dL (ref <150)

## 2010-03-29 ENCOUNTER — Emergency Department (HOSPITAL_COMMUNITY)
Admission: EM | Admit: 2010-03-29 | Discharge: 2010-03-29 | Payer: Self-pay | Source: Home / Self Care | Admitting: Emergency Medicine

## 2010-04-15 ENCOUNTER — Telehealth: Payer: Self-pay | Admitting: Family Medicine

## 2010-04-16 ENCOUNTER — Telehealth: Payer: Self-pay | Admitting: Family Medicine

## 2010-04-18 ENCOUNTER — Ambulatory Visit: Payer: Self-pay | Admitting: Family Medicine

## 2010-04-18 DIAGNOSIS — J019 Acute sinusitis, unspecified: Secondary | ICD-10-CM

## 2010-04-18 LAB — CONVERTED CEMR LAB
Ketones, urine, test strip: NEGATIVE
Protein, U semiquant: NEGATIVE
Specific Gravity, Urine: 1.015
pH: 5.5

## 2010-04-21 ENCOUNTER — Ambulatory Visit: Payer: Self-pay | Admitting: Family Medicine

## 2010-04-23 ENCOUNTER — Encounter: Payer: Self-pay | Admitting: Family Medicine

## 2010-06-02 ENCOUNTER — Telehealth: Payer: Self-pay | Admitting: Family Medicine

## 2010-06-03 ENCOUNTER — Ambulatory Visit: Payer: Self-pay | Admitting: Family Medicine

## 2010-06-03 DIAGNOSIS — J42 Unspecified chronic bronchitis: Secondary | ICD-10-CM | POA: Insufficient documentation

## 2010-06-18 ENCOUNTER — Ambulatory Visit: Payer: Self-pay | Admitting: Family Medicine

## 2010-07-20 ENCOUNTER — Encounter: Payer: Self-pay | Admitting: Family Medicine

## 2010-07-20 ENCOUNTER — Encounter: Payer: Self-pay | Admitting: *Deleted

## 2010-07-21 ENCOUNTER — Encounter: Payer: Self-pay | Admitting: Urology

## 2010-07-24 ENCOUNTER — Ambulatory Visit (HOSPITAL_COMMUNITY): Admission: RE | Admit: 2010-07-24 | Payer: Self-pay | Source: Home / Self Care | Admitting: Family Medicine

## 2010-07-25 ENCOUNTER — Other Ambulatory Visit: Payer: Self-pay | Admitting: Family Medicine

## 2010-07-25 DIAGNOSIS — Z139 Encounter for screening, unspecified: Secondary | ICD-10-CM

## 2010-07-29 ENCOUNTER — Ambulatory Visit
Admission: RE | Admit: 2010-07-29 | Discharge: 2010-07-29 | Payer: Self-pay | Source: Home / Self Care | Attending: Family Medicine | Admitting: Family Medicine

## 2010-07-29 LAB — CONVERTED CEMR LAB: Microalb, Ur: 2.73 mg/dL — ABNORMAL HIGH (ref 0.00–1.89)

## 2010-07-29 NOTE — Progress Notes (Signed)
  Phone Note Call from Patient   Summary of Call: Aide [name] came in today and wants [place] to type a letter stating that she is able to take [name]'s meds home with her every evening. And also an order stating that she is to not have advil or aleve because it causes her sugar to increase. ALSO, she states she has been vomiting and wants something for nausea sent to [facility]. Called patient for more info but no answer Initial call taken by: Everitt Amber,  August 12, 2009 3:52 PM  Follow-up for Phone Call        I am unable to provide a letter fo herto take pt's med home. if when she responds she has vomitting then erx phenergan 12.5mg  one 3 times daily as needed #30 Follow-up by: [name] MD,  August 12, 2009 5:14 PM  Additional Follow-up for Phone Call Additional follow up Details #1::        returned call, busy signal Additional Follow-up by: Lilyan Gilford LPN,  August 13, 2009 3:18 PM    Additional Follow-up for Phone Call Additional follow up Details #2::    patient states she is feeling good Follow-up by: Adella Hare LPN,  August 14, 2009 9:52 AM

## 2010-07-29 NOTE — Miscellaneous (Signed)
Summary: Home Care Report  Home Care Report   Imported By: Lind Guest 04/23/2010 11:39:38  _____________________________________________________________________  External Attachment:    Type:   Image     Comment:   External Document

## 2010-07-29 NOTE — Progress Notes (Signed)
Summary: med  Phone Note Call from Patient   Summary of Call: pt calling stating doc suppose to increase her sleeping med. [phone] Initial call taken by: Rudene Anda,  March 12, 2010 11:50 AM  Follow-up for Phone Call        med changes were already sent in Follow-up by: Adella Hare LPN,  March 12, 2010 11:55 AM

## 2010-07-29 NOTE — Progress Notes (Signed)
Summary: needs help  Phone Note Call from Patient   Summary of Call: has no help now her aid got fired last friday and now she needs some help   Initial call taken by: Lind Guest,  Nov 20, 2009 10:46 AM  Follow-up for Phone Call        called [place] and she states that her aide was fired but also that [employer] was going out of business also. Tried to call them and they number stayed busy. Never could reach anybody.  Wants to be referred to another agency. Do you have a preference and does she just need an aide or a nurse to help with meds and sugar. Just let me know and I will refer her. Follow-up by: Everitt Amber LPN,  Nov 20, 2009 10:58 AM  Additional Follow-up for Phone Call Additional follow up Details #1::        pls refer to advanced Additional Follow-up by: [name] MD,  Nov 20, 2009 11:31 AM    Additional Follow-up for Phone Call Additional follow up Details #2::    referred to [facility] Follow-up by: Everitt Amber LPN,  Nov 20, 2009 4:12 PM

## 2010-07-29 NOTE — Progress Notes (Signed)
Summary: VIRUS  Phone Note Call from Patient   Summary of Call: NEEDS YOU TO CALL HER THINKS SHE HAS A VIRUS Initial call taken by: Lind Guest,  July 24, 2009 1:50 PM  Follow-up for Phone Call        called patient, states nurse would not bring medicine  states she has a head cold Follow-up by: Worthy Keeler LPN,  July 24, 2009 4:08 PM

## 2010-07-29 NOTE — Progress Notes (Signed)
  Phone Note Call from Patient   Caller: Patient Summary of Call: stuffy nose and headache Initial call taken by: Worthy Keeler LPN,  July 23, 2009 2:16 PM  Follow-up for Phone Call        advised OTC robutussin dm SUGARFREE Follow-up by: Worthy Keeler LPN,  July 23, 2009 2:16 PM

## 2010-07-29 NOTE — Progress Notes (Signed)
  Phone Note Call from Patient   Caller: home health nurse Summary of Call: nurse states went out to see her last week and she was doing very well temp 98.4, pulse 88, resp 24, bp 112/68, blood sugar 98, o2 97% no pain, took walk that day, very small edema lower leg, blood sugars have been logged and good, lowest 98 and hightest 262 nurse states she looked great, best she has ever seen her Initial call taken by: Adella Hare LPN,  Nov 26, 2009 10:26 AM  Follow-up for Phone Call        Dr. Lodema Hong aware  Follow-up by: Everitt Amber LPN,  November 27, 1608 9:46 AM

## 2010-07-29 NOTE — Medication Information (Signed)
Summary: Tax adviser   Imported By: Lind Guest 09/25/2009 08:16:17  _____________________________________________________________________  External Attachment:    Type:   Image     Comment:   External Document

## 2010-07-29 NOTE — Letter (Signed)
Summary: fl2 form  fl2 form   Imported By: Curtis Sites 01/30/2010 11:33:48  _____________________________________________________________________  External Attachment:    Type:   Image     Comment:   External Document

## 2010-07-29 NOTE — Assessment & Plan Note (Signed)
Summary: OV   Vital Signs:  Patient profile:   77 year old female Menstrual status:  postmenopausal Weight:      187.25 pounds O2 Sat:      97 % on Room air Pulse rate:   118 / minute Resp:     24 per minute BP sitting:   130 / 78  (left arm)  Serial Vital Signs/Assessments:  Time      Position  BP       Pulse  Resp  Temp     By                              104    24             Yesenia Locurto PA  CC: cold, stuffy nose, no appetite, and headache ROOM 3 Is Patient Diabetic? Yes Did you bring your meter with you today? No   Primary Provider:  [name] MD  CC:  cold, stuffy nose, no appetite, and and headache ROOM 3.  History of Present Illness: Pt reports that she has a stuffy nose, frontal HA and decreased appetite for about 2 days now. States she gets full faster than normal, with just a few bites, when she eats. No cough , fever or chills.  No nausea or vomiting.  No abd pain.  BMs normal.  No dysuria. Hx of urinary frq and incontinence, no change.  Hx of htn. Taking medications as prescribed and denies side effects.  No chest pain or palpitations.      Allergies: 1)  ! Pcn  Past History:  Past medical history reviewed for relevance to current acute and chronic problems.  Past Medical History: Reviewed history from 03/28/2008 and no changes required. diabetes htn reflux dementia otif right elbow  remove fixation and triceps adv   Review of Systems General:  Denies chills and fever. ENT:  Complains of nasal congestion, postnasal drainage, and sinus pressure; denies earache and sore throat. CV:  Denies chest pain or discomfort and palpitations. Resp:  Denies cough and shortness of breath. GI:  Complains of loss of appetite; denies abdominal pain, change in bowel habits, nausea, and vomiting.  Physical Exam  General:  Well-developed,well-nourished,in no acute distress; alert,appropriate and cooperative throughout examination Head:  Normocephalic and  atraumatic without obvious abnormalities. No apparent alopecia or balding. Ears:  External ear exam shows no significant lesions or deformities.  Otoscopic examination reveals clear canals, tympanic membranes are intact bilaterally without bulging, retraction, inflammation or discharge. Hearing is grossly normal bilaterally. Nose:  no external deformity, mucosal erythema, mucosal edema, L frontal sinus tenderness, and R frontal sinus tenderness.   Mouth:  Oral mucosa and oropharynx without lesions or exudates.   Neck:  No deformities, masses, or tenderness noted. Lungs:  Normal respiratory effort, chest expands symmetrically. Lungs are clear to auscultation, no crackles or wheezes. Heart:  Normal rate and regular rhythm. S1 and S2 normal without gallop, murmur, click, rub or other extra sounds. Cervical Nodes:  No lymphadenopathy noted Psych:  normally interactive and good eye contact.     Impression & Recommendations:  Problem # 1:  SINUSITIS, ACUTE (ICD-461.9) Assessment New  Her updated medication list for this problem includes:    Doxycycline Hyclate 100 Mg Solr (Doxycycline hyclate) .Marland Kitchen... Take one two times a day x 7 days  Problem # 2:  HYPERTENSION (ICD-401.9) Assessment: Comment Only  Her updated medication list for  this problem includes:    Benicar 40 Mg Tabs (Olmesartan medoxomil) .Marland Kitchen... Take 1 tablet by mouth once a day    Norvasc 5 Mg Tabs (Amlodipine besylate) .Marland Kitchen... Take 1 tablet by mouth once a day  BP today: 130/78 Prior BP: 114/70 (03/11/2010)  Labs Reviewed: K+: 4.8 (03/12/2010) Creat: : 0.80 (03/12/2010)   Chol: 125 (03/12/2010)   HDL: 49 (03/12/2010)   LDL: 58 (03/12/2010)   TG: 91 (03/12/2010)  Complete Medication List: 1)  Seroquel 50 Mg Tabs (Quetiapine fumarate) .... Take 1 tab by mouth at bedtime 2)  Walker  3)  Metformin Hcl 1000 Mg Tabs (Metformin hcl) .... Take one tab by mouth two times a day with food 4)  Oyster Shell/d 500-200 Mg-unit Tabs  (Calcium-vitamin d) .... Take 1 tab by mouth once daily 5)  Benicar 40 Mg Tabs (Olmesartan medoxomil) .... Take 1 tablet by mouth once a day 6)  Norvasc 5 Mg Tabs (Amlodipine besylate) .... Take 1 tablet by mouth once a day 7)  Nexium 40 Mg Cpdr (Esomeprazole magnesium) .... Take 1 tablet by mouth once a day 8)  Enablex 15 Mg Xr24h-tab (Darifenacin hydrobromide) .... One tab by mouth qd 9)  Bd Pen Needle Mini U/f 31g X 5 Mm Misc (Insulin pen needle) .... Use as directed 10)  Onetouch Ultra Test Strp (Glucose blood) .... Uad three times a day 11)  Lantus Solostar 100 Unit/ml Soln (Insulin glargine) .Marland Kitchen.. 15 units every morning 12)  Glipizide 10 Mg Tabs (Glipizide) .... Take 1 tablet by mouth two times a day 13)  Alendronate Sodium 70 Mg Tabs (Alendronate sodium) .... One tablet every monday 14)  Lantus Solostar 100 Unit/ml Soln (Insulin glargine) .Marland Kitchen.. 15 units twice daily, at breakfasst, and at bedtime 15)  Lovastatin 10 Mg Tabs (Lovastatin) .... Take 1 tab by mouth at bedtime 16)  Loratadine 10 Mg Tabs (Loratadine) .... One tab by mouth once daily for allergies 17)  Restoril 30 Mg Caps (Temazepam) .... Take 1 capsule by mouth at bedtime 18)  Doxycycline Hyclate 100 Mg Solr (Doxycycline hyclate) .... Take one two times a day x 7 days  Patient Instructions: 1)  Keep your appt in January with Dr Lodema Hong.  Return sooner as needed. 2)  Increase fluids. 3)  I have prescribed an antibiotic for your sinus congestion. 4)  You urine was negative for infection today. Prescriptions: DOXYCYCLINE HYCLATE 100 MG SOLR (DOXYCYCLINE HYCLATE) take one two times a day x 7 days  #14 x 0   Entered and Authorized by:   Esperanza Sheets PA   Signed by:   Esperanza Sheets PA on 04/18/2010   Method used:   Electronically to        [facility] (retail)       [address]       [place], [place]  [address]       Ph: [phone]       Fax: [phone]   RxID:    [id number]    Orders Added: 1)  Est. Patient Level III [18841]    Laboratory Results   Urine Tests    Routine Urinalysis   Color: yellow Appearance: Clear Glucose: negative   (Normal Range: Negative) Bilirubin: negative   (Normal Range: Negative) Ketone: negative   (Normal Range: Negative) Spec. Gravity: 1.015   (Normal Range: 1.003-1.035) Blood: trace-lysed   (Normal Range: Negative) pH: 5.5   (Normal Range: 5.0-8.0) Protein: negative   (  Normal Range: Negative) Urobilinogen: 0.2   (Normal Range: 0-1) Nitrite: negative   (Normal Range: Negative) Leukocyte Esterace: negative   (Normal Range: Negative)

## 2010-07-29 NOTE — Progress Notes (Signed)
Summary: medicine  Phone Note Call from Patient   Summary of Call: please give pt a call. says her other aid went to drug store and wouldnt let her get any med over counter. [phone] Initial call taken by: Rudene Anda,  February 06, 2010 9:29 AM  Follow-up for Phone Call        please tell hershe is on so many meds already she really should not take otc meds i dont think she needs anymore medicine Follow-up by: [name] MD,  February 06, 2010 11:59 AM  Additional Follow-up for Phone Call Additional follow up Details #1::        patient aware Additional Follow-up by: Everitt Amber LPN,  February 06, 2010 2:50 PM

## 2010-07-29 NOTE — Assessment & Plan Note (Signed)
Summary: recert   Allergies: 1)  ! Pcn   Complete Medication List: 1)  Seroquel 50 Mg Tabs (Quetiapine fumarate) .... Take 1 tab by mouth at bedtime 2)  Walker  3)  Metformin Hcl 1000 Mg Tabs (Metformin hcl) .... Take one tab by mouth two times a day with food 4)  Oyster Shell/d 500-200 Mg-unit Tabs (Calcium-vitamin d) .... Take 1 tab by mouth once daily 5)  Benicar 40 Mg Tabs (Olmesartan medoxomil) .... Take 1 tablet by mouth once a day 6)  Norvasc 5 Mg Tabs (Amlodipine besylate) .... Take 1 tablet by mouth once a day 7)  Nexium 40 Mg Cpdr (Esomeprazole magnesium) .... Take 1 tablet by mouth once a day 8)  Enablex 15 Mg Xr24h-tab (Darifenacin hydrobromide) .... One tab by mouth qd 9)  Bd Pen Needle Mini U/f 31g X 5 Mm Misc (Insulin pen needle) .... Use as directed 10)  Onetouch Ultra Test Strp (Glucose blood) .... Uad three times a day 11)  Lantus Solostar 100 Unit/ml Soln (Insulin glargine) .Marland Kitchen.. 15 units every morning 12)  Glipizide 10 Mg Tabs (Glipizide) .... Take 1 tablet by mouth two times a day 13)  Alendronate Sodium 70 Mg Tabs (Alendronate sodium) .... One tablet every monday 14)  Hydroxyzine Hcl 25 Mg Tabs (Hydroxyzine hcl) .... Take 1 tab by mouth at bedtime  as needed 15)  Lantus Solostar 100 Unit/ml Soln (Insulin glargine) .Marland Kitchen.. 15 units twice daily, at breakfasst, and at bedtime 16)  Lovastatin 10 Mg Tabs (Lovastatin) .... Take 1 tab by mouth at bedtime recertification form reviewed and signed

## 2010-07-29 NOTE — Assessment & Plan Note (Signed)
Summary: FUP   Vital Signs:  Patient profile:   77 year old female Menstrual status:  postmenopausal Height:      66.5 inches Weight:      181.50 pounds BMI:     28.96 O2 Sat:      97 % Pulse rate:   128 / minute Pulse rhythm:   regular Resp:     16 per minute BP sitting:   130 / 80 Cuff size:   regular  Vitals Entered By: Everitt Amber (July 16, 2009 12:58 PM)  Nutrition Counseling: Patient's BMI is greater than 25 and therefore counseled on weight management options. CC: Follow up chronic problems Is Patient Diabetic? Yes   Primary Care Provider:  [name] MD  CC:  Follow up chronic problems.  History of Present Illness: Reports  that she has been doing well. Denies recent fever or chills. Denies sinus pressure, nasal congestion , ear pain or sore throat. Denies chest congestion, or cough productive of sputum. Denies chest pain, palpitations, PND, orthopnea or leg swelling. Denies abdominal pain, nausea, vomitting, diarrhea or constipation. Denies change in bowel movements or bloody stool. Denies dysuria , frequency, incontinence or hesitancy. Denies  joint pain, swelling, or reduced mobility. Denies headaches, vertigo, seizures. Denies depression, anxiety or insomnia. Denies  rash, lesions, or itch.     Current Medications (verified): 1)  Seroquel 50 Mg  Tabs (Quetiapine Fumarate) .... Take 1 Tab By Mouth At Bedtime 2)  Walker 3)  Metformin Hcl 1000 Mg Tabs (Metformin Hcl) .... Take One Tab By Mouth Two Times A Day With Food 4)  Oyster Shell/d 500-200 Mg-Unit Tabs (Calcium-Vitamin D) .... Take 1 Tab By Mouth Once Daily 5)  Benicar 40 Mg Tabs (Olmesartan Medoxomil) .... Take 1 Tablet By Mouth Once A Day 6)  Norvasc 5 Mg Tabs (Amlodipine Besylate) .... Take 1 Tablet By Mouth Once A Day 7)  Nexium 40 Mg Cpdr (Esomeprazole Magnesium) .... Take 1 Tablet By Mouth Once A Day 8)  Enablex 15 Mg Xr24h-Tab (Darifenacin Hydrobromide) .... One Tab By Mouth Qd 9)   Bd Pen Needle Mini U/f 31g X 5 Mm Misc (Insulin Pen Needle) .... Use As Directed 10)  Onetouch Ultra Test  Strp (Glucose Blood) .... Uad Three Times A Day 11)  Lantus Solostar 100 Unit/ml Soln (Insulin Glargine) .Marland Kitchen.. 15 Units Every Morning 12)  Glipizide 10 Mg Tabs (Glipizide) .... Take 1 Tablet By Mouth Two Times A Day 13)  Alendronate Sodium 70 Mg Tabs (Alendronate Sodium) .... One Tablet Every Monday  Allergies (verified): 1)  ! Pcn  Review of Systems General:  Complains of sleep disorder; chronic sleep disorder. Eyes:  Denies discharge and red eye. ENT:  Complains of nasal congestion; 2 day history. Neuro:  Complains of headaches; denies seizures and sensation of room spinning. Endo:  Denies cold intolerance, excessive hunger, excessive thirst, excessive urination, heat intolerance, polyuria, and weight change; tests 3 times per day, fasting 105 to 187, post     190, evening at   185 . Heme:  Denies abnormal bruising and bleeding. Allergy:  Denies hives or rash and sneezing.  Physical Exam  General:  Well-developed,obese,in no acute distress; alert,appropriate and cooperative throughout examination HEENT: No facial asymmetry,  EOMI, No sinus tenderness, TM's Clear, oropharynx  pink and moist.   Chest: Clear to auscultation bilaterally.  CVS: S1, S2, No murmurs, No S3.   Abd: Soft, Nontender.  MS: Adequate ROM spine, hips, shoulders and knees.  Ext: No  edema.   CNS: CN 2-12 intact, power tone and sensation normal throughout.   Skin: Intact, no visible lesions or rashes.  Psych: Good eye contact, normal affect.  Memory loss, not anxious or depressed appearing.    Impression & Recommendations:  Problem # 1:  OBESITY, UNSPECIFIED (ICD-278.00) Assessment Unchanged  Ht: 66.5 (07/16/2009)   Wt: 181.50 (07/16/2009)   BMI: 28.96 (07/16/2009)  Problem # 2:  DIABETES MELLITUS, TYPE II (ICD-250.00) Assessment: Deteriorated  Her updated medication list for this problem includes:     Metformin Hcl 1000 Mg Tabs (Metformin hcl) .Marland Kitchen... Take one tab by mouth two times a day with food    Benicar 40 Mg Tabs (Olmesartan medoxomil) .Marland Kitchen... Take 1 tablet by mouth once a day    Lantus Solostar 100 Unit/ml Soln (Insulin glargine) .Marland KitchenMarland KitchenMarland KitchenMarland Kitchen 15 units every morning    Glipizide 10 Mg Tabs (Glipizide) .Marland Kitchen... Take 1 tablet by mouth two times a day    Lantus Solostar 100 Unit/ml Soln (Insulin glargine) .Marland KitchenMarland KitchenMarland KitchenMarland Kitchen 15 units twice daily, at breakfasst, and at bedtime  Orders: Glucose, (CBG) (82962) Hemoglobin A1C (83036) T-Urine Microalbumin w/creat. ratio [phone])  Labs Reviewed: Creat: 0.90 (07/12/2008)    Reviewed HgBA1c results: 9.1 (07/16/2009)  6.9 (04/22/2009)  Problem # 3:  HYPERTENSION (ICD-401.9) Assessment: Improved  Her updated medication list for this problem includes:    Benicar 40 Mg Tabs (Olmesartan medoxomil) .Marland Kitchen... Take 1 tablet by mouth once a day    Norvasc 5 Mg Tabs (Amlodipine besylate) .Marland Kitchen... Take 1 tablet by mouth once a day  Orders: T-Basic Metabolic Panel [id number])  BP today: 130/80 Prior BP: 146/80 (06/17/2009)  Labs Reviewed: K+: 4.8 (07/12/2008) Creat: : 0.90 (07/12/2008)   Chol: 206 (07/12/2008)   HDL: 61 (07/12/2008)   LDL: 123 (07/12/2008)   TG: 111 (07/12/2008)  Problem # 4:  HYPERLIPIDEMIA (ICD-272.4) Assessment: Comment Only  Orders: T-Lipid Profile ([id number]) T-Hepatic Function [phone])  Labs Reviewed: SGOT: 13 (07/12/2008)   SGPT: 9 (07/12/2008)   HDL:61 (07/12/2008), 67 (09/01/2007)  LDL:123 (07/12/2008), 68 (02/72/5366)  Chol:206 (07/12/2008), 155 (09/01/2007)  Trig:111 (07/12/2008), 101 (09/01/2007)  Her updated medication list for this problem includes:    Lovastatin 10 Mg Tabs (Lovastatin) .Marland Kitchen... Take 1 tab by mouth at bedtime  Problem # 7:  INSOMNIA (ICD-780.52) Assessment: Deteriorated  hydroxyzine added  Discussed sleep hygiene.   Complete Medication List: 1)  Seroquel 50 Mg Tabs (Quetiapine fumarate) ....  Take 1 tab by mouth at bedtime 2)  Walker  3)  Metformin Hcl 1000 Mg Tabs (Metformin hcl) .... Take one tab by mouth two times a day with food 4)  Oyster Shell/d 500-200 Mg-unit Tabs (Calcium-vitamin d) .... Take 1 tab by mouth once daily 5)  Benicar 40 Mg Tabs (Olmesartan medoxomil) .... Take 1 tablet by mouth once a day 6)  Norvasc 5 Mg Tabs (Amlodipine besylate) .... Take 1 tablet by mouth once a day 7)  Nexium 40 Mg Cpdr (Esomeprazole magnesium) .... Take 1 tablet by mouth once a day 8)  Enablex 15 Mg Xr24h-tab (Darifenacin hydrobromide) .... One tab by mouth qd 9)  Bd Pen Needle Mini U/f 31g X 5 Mm Misc (Insulin pen needle) .... Use as directed 10)  Onetouch Ultra Test Strp (Glucose blood) .... Uad three times a day 11)  Lantus Solostar 100 Unit/ml Soln (Insulin glargine) .Marland Kitchen.. 15 units every morning 12)  Glipizide 10 Mg Tabs (Glipizide) .... Take 1 tablet by mouth two times a day 13)  Alendronate Sodium 70 Mg  Tabs (Alendronate sodium) .... One tablet every monday 14)  Hydroxyzine Hcl 25 Mg Tabs (Hydroxyzine hcl) .... Take 1 tab by mouth at bedtime  as needed 15)  Lantus Solostar 100 Unit/ml Soln (Insulin glargine) .Marland Kitchen.. 15 units twice daily, at breakfasst, and at bedtime 16)  Lovastatin 10 Mg Tabs (Lovastatin) .... Take 1 tab by mouth at bedtime  Other Orders: T-TSH ([id number]) Radiology Referral (Radiology)  Patient Instructions: 1)  F/U in 6 weeks 2)  Increase lantus to 15 units twice daily pls. 3)  keep active and cut down on portion size to lose weight. 4)  Yopu need your mamo 5)  tEST before breakfast, 6)  2 hrs after lunch , and at bedtime 7)  BMP prior to visit, ICD-9: 8)  Hepatic Panel prior to visit, ICD-9: 9)  Lipid Panel prior to visit, ICD-9:    fasting asap 10)  TSH prior to visit, ICD-9: 11)  Urine Microalbumin prior to visit, ICD-9: Prescriptions: LOVASTATIN 10 MG TABS (LOVASTATIN) Take 1 tab by mouth at bedtime  #30 x 3   Entered and Authorized by:   [name] MD   Signed by:   [name] MD on 07/21/2009   Method used:   Electronically to        [facility] (retail)       [address] [place], [place]  [address]       Ph: [phone]       Fax: [phone]   RxID:   [id number] ENABLEX 15 MG XR24H-TAB (DARIFENACIN HYDROBROMIDE) one tab by mouth qd  #30 x 3   Entered by:   Everitt Amber   Authorized by:   [name] MD   Signed by:   Everitt Amber on 07/16/2009   Method used:   Electronically to        [facility] (retail)       [address]       [place], [place]  [address]       Ph: [phone]       Fax: [phone]   RxID:   [id number] LANTUS SOLOSTAR 100 UNIT/ML SOLN (INSULIN GLARGINE) 15 units twice daily, at breakfasst, and at bedtime  #1000 units x 4   Entered and Authorized by:   [name] MD   Signed by:   [name] MD on 07/16/2009   Method used:   Printed then faxed to ...       [facility] (retail)       [address]       [place], [place]  [address]       Ph: [phone]       Fax: [phone]   RxID:   [id number] HYDROXYZINE HCL 25 MG TABS (HYDROXYZINE HCL) Take 1 tab by mouth at bedtime  as needed  #30 x 3   Entered and Authorized by:   [name] MD   Signed by:   [name] MD on 07/16/2009   Method used:   Electronically to        [facility] (retail)       [address] [place], [place]  [address]       Ph: [phone]  Fax: [phone]   RxID:   [id number]   Laboratory Results   Blood Tests   Date/Time Received: July 16, 2009  Date/Time Reported: July 16, 2009   Glucose (random): 171 mg/dL   (Normal Range: 40-102) HGBA1C: 9.1%   (Normal Range: Non-Diabetic - 3-6%   Control Diabetic - 6-8%)

## 2010-07-29 NOTE — Progress Notes (Signed)
Summary: MEDICINE  Phone Note Call from Patient   Summary of Call: NEEDS HER TEMAZPAM AND LOVASTATIN SEND TO [facility] Initial call taken by: Lind Guest,  March 12, 2010 10:43 AM  Follow-up for Phone Call        Rx Called In Follow-up by: Adella Hare LPN,  March 12, 2010 11:54 AM    Prescriptions: LOVASTATIN 10 MG TABS (LOVASTATIN) Take 1 tab by mouth at bedtime  #30 Each x 4   Entered by:   Adella Hare LPN   Authorized by:   [name] MD   Signed by:   Adella Hare LPN on 57/32/2025   Method used:   Printed then faxed to ...       [facility] (retail)       [address]       [place], [place]  [address]       Ph: [phone]       Fax: [phone]   RxID:   [id number] RESTORIL 30 MG CAPS (TEMAZEPAM) Take 1 capsule by mouth at bedtime  #30 x 4   Entered by:   Adella Hare LPN   Authorized by:   [name] MD   Signed by:   Adella Hare LPN on 93/81/8299   Method used:   Printed then faxed to ...       [facility] (retail)       [address]       [place], [place]  [address]       Ph: [phone]       Fax: [phone]   RxID:   [id number]

## 2010-07-29 NOTE — Assessment & Plan Note (Signed)
Summary: recert   Allergies: 1)  ! Pcn   Complete Medication List: 1)  Seroquel 50 Mg Tabs (Quetiapine fumarate) .... Take 1 tab by mouth at bedtime 2)  Walker  3)  Metformin Hcl 1000 Mg Tabs (Metformin hcl) .... Take one tab by mouth two times a day with food 4)  Oyster Shell/d 500-200 Mg-unit Tabs (Calcium-vitamin d) .... Take 1 tab by mouth once daily 5)  Benicar 40 Mg Tabs (Olmesartan medoxomil) .... Take 1 tablet by mouth once a day 6)  Norvasc 5 Mg Tabs (Amlodipine besylate) .... Take 1 tablet by mouth once a day 7)  Nexium 40 Mg Cpdr (Esomeprazole magnesium) .... Take 1 tablet by mouth once a day 8)  Enablex 15 Mg Xr24h-tab (Darifenacin hydrobromide) .... One tab by mouth qd 9)  Bd Pen Needle Mini U/f 31g X 5 Mm Misc (Insulin pen needle) .... Use as directed 10)  Onetouch Ultra Test Strp (Glucose blood) .... Uad three times a day 11)  Lantus Solostar 100 Unit/ml Soln (Insulin glargine) .Marland Kitchen.. 15 units every morning 12)  Glipizide 10 Mg Tabs (Glipizide) .... Take 1 tablet by mouth two times a day 13)  Alendronate Sodium 70 Mg Tabs (Alendronate sodium) .... One tablet every monday 14)  Lantus Solostar 100 Unit/ml Soln (Insulin glargine) .Marland Kitchen.. 15 units twice daily, at breakfasst, and at bedtime 15)  Lovastatin 10 Mg Tabs (Lovastatin) .... Take 1 tab by mouth at bedtime 16)  Loratadine 10 Mg Tabs (Loratadine) .... One tab by mouth once daily for allergies 17)  Temazepam 15 Mg Caps (Temazepam) .... Take 1 capsule by mouth at bedtime  Other Orders: Re-certification of Home Health [phone])

## 2010-07-29 NOTE — Miscellaneous (Signed)
Summary: Home Care Report  Home Care Report   Imported By: Lind Guest 09/02/2009 10:23:41  _____________________________________________________________________  External Attachment:    Type:   Image     Comment:   External Document

## 2010-07-29 NOTE — Progress Notes (Signed)
Summary: [facility]  Phone Note Call from Patient   Summary of Call: tracy calling from [facility] needs for nurse to call her back about aid riding around with pts meds. [phone]  fax [phone] Initial call taken by: Rudene Anda,  September 04, 2009 2:10 PM  Follow-up for Phone Call        called back line busy x2 Follow-up by: Everitt Amber LPN,  September 09, 2009 8:57 AM  Additional Follow-up for Phone Call Additional follow up Details #1::        called again and left message on answering service that I was returning tracy's call and to call me back  Additional Follow-up by: Everitt Amber LPN,  September 12, 2009 7:53 AM    Additional Follow-up for Phone Call Additional follow up Details #2::    is it legal for aide to be taking patients medicine from home? and if soo can that be put in writing Follow-up by: Adella Hare LPN,  September 12, 2009 11:23 AM  Additional Follow-up for Phone Call Additional follow up Details #3:: Details for Additional Follow-up Action Taken: couldn't reach [facility]. Faxed letter to her stating it was not ok for aide to do this and if there was a problems with her meds being left there then she may need to look at being in a facility where this is monitored.  Additional Follow-up by: Everitt Amber LPN,  September 18, 2009 2:05 PM

## 2010-07-29 NOTE — Miscellaneous (Signed)
Summary: Home Care Report  Home Care Report   Imported By: Lind Guest 11/21/2009 08:54:12  _____________________________________________________________________  External Attachment:    Type:   Image     Comment:   External Document

## 2010-07-29 NOTE — Miscellaneous (Signed)
Summary: fl2   fl2   Imported By: Lind Guest 01/17/2010 15:14:04  _____________________________________________________________________  External Attachment:    Type:   Image     Comment:   External Document

## 2010-07-29 NOTE — Assessment & Plan Note (Signed)
Summary: fup   Vital Signs:  Patient profile:   77 year old female Menstrual status:  postmenopausal Height:      66.5 inches Weight:      190 pounds BMI:     30.32 O2 Sat:      93 % Pulse rate:   112 / minute Pulse rhythm:   regular Resp:     16 per minute BP sitting:   120 / 80  (left arm) Cuff size:   regular  Vitals Entered By: Everitt Amber LPN (December 09, 2009 2:07 PM)  Nutrition Counseling: Patient's BMI is greater than 25 and therefore counseled on weight management options. CC: has been having problems sleeping   Primary Care Provider:  [name] MD  CC:  has been having problems sleeping.  History of Present Illness: Reports  that they are doing well. Denies recent fever or chills. Denies sinus pressure, nasal congestion , ear pain or sore throat. Denies chest congestion, or cough productive of sputum. Denies chest pain, palpitations, PND, orthopnea or leg swelling. Denies abdominal pain, nausea, vomitting, diarrhea or constipation. Denies change in bowel movements or bloody stool. Denies dysuria , frequency, incontinence or hesitancy. Denies  joint pain, swelling, or reduced mobility. Denies headaches, vertigo, seizures. Denies depression and  anxiety, these are controlled by her meds, she c/o poor slewep and is requesting medication.She has fatigue.  Denies  rash, lesions, or itch. She tests her blood sugars regularly, the fastings range between 90 to 140     Current Medications (verified): 1)  Seroquel 50 Mg  Tabs (Quetiapine Fumarate) .... Take 1 Tab By Mouth At Bedtime 2)  Walker 3)  Metformin Hcl 1000 Mg Tabs (Metformin Hcl) .... Take One Tab By Mouth Two Times A Day With Food 4)  Oyster Shell/d 500-200 Mg-Unit Tabs (Calcium-Vitamin D) .... Take 1 Tab By Mouth Once Daily 5)  Benicar 40 Mg Tabs (Olmesartan Medoxomil) .... Take 1 Tablet By Mouth Once A Day 6)  Norvasc 5 Mg Tabs (Amlodipine Besylate) .... Take 1 Tablet By Mouth Once A Day 7)  Nexium  40 Mg Cpdr (Esomeprazole Magnesium) .... Take 1 Tablet By Mouth Once A Day 8)  Enablex 15 Mg Xr24h-Tab (Darifenacin Hydrobromide) .... One Tab By Mouth Qd 9)  Bd Pen Needle Mini U/f 31g X 5 Mm Misc (Insulin Pen Needle) .... Use As Directed 10)  Onetouch Ultra Test  Strp (Glucose Blood) .... Uad Three Times A Day 11)  Lantus Solostar 100 Unit/ml Soln (Insulin Glargine) .Marland Kitchen.. 15 Units Every Morning 12)  Glipizide 10 Mg Tabs (Glipizide) .... Take 1 Tablet By Mouth Two Times A Day 13)  Alendronate Sodium 70 Mg Tabs (Alendronate Sodium) .... One Tablet Every Monday 14)  Lantus Solostar 100 Unit/ml Soln (Insulin Glargine) .Marland Kitchen.. 15 Units Twice Daily, At Breakfasst, and At Bedtime 15)  Lovastatin 10 Mg Tabs (Lovastatin) .... Take 1 Tab By Mouth At Bedtime 16)  Loratadine 10 Mg Tabs (Loratadine) .... One Tab By Mouth Once Daily For Allergies  Allergies (verified): 1)  ! Pcn  Review of Systems      See HPI General:  Complains of fatigue and sleep disorder. Eyes:  Complains of vision loss-1 eye. Endo:  Denies cold intolerance, excessive hunger, excessive thirst, excessive urination, heat intolerance, polyuria, and weight change. Heme:  Denies abnormal bruising and bleeding. Allergy:  Complains of seasonal allergies; denies hives or rash and itching eyes.  Physical Exam  General:  Well-developed,obese,in no acute distress; alert,appropriate and cooperative  throughout examination HEENT: No facial asymmetry,  EOMI, No sinus tenderness, TM's Clear, oropharynx  pink and moist.   Chest: Clear to auscultation bilaterally.  CVS: S1, S2, No murmurs, No S3.   Abd: Soft, Nontender.  MS: Adequate ROM spine, hips, shoulders and knees.  Ext: No edema.   CNS: CN 2-12 intact, power tone and sensation normal throughout.   Skin: Intact, no visible lesions or rashes.  Psych: Good eye contact, normal affect.  Memory loss, not anxious or depressed appearing.    Impression & Recommendations:  Problem # 1:   FATIGUE (ICD-780.79) Assessment Deteriorated  Orders: T-CBC w/Diff ([id number])  Problem # 2:  OBESITY, UNSPECIFIED (ICD-278.00) Assessment: Deteriorated  Ht: 66.5 (12/09/2009)   Wt: 190 (12/09/2009)   BMI: 30.32 (12/09/2009)  Problem # 3:  INSOMNIA (ICD-780.52) Assessment: Deteriorated  Her updated medication list for this problem includes:    Temazepam 15 Mg Caps (Temazepam) .Marland Kitchen... Take 1 capsule by mouth at bedtime  Discussed sleep hygiene.   Problem # 4:  HYPERTENSION (ICD-401.9) Assessment: Improved  Her updated medication list for this problem includes:    Benicar 40 Mg Tabs (Olmesartan medoxomil) .Marland Kitchen... Take 1 tablet by mouth once a day    Norvasc 5 Mg Tabs (Amlodipine besylate) .Marland Kitchen... Take 1 tablet by mouth once a day  Orders: T-Basic Metabolic Panel [phone])  BP today: 120/80 Prior BP: 130/80 (07/16/2009)  Labs Reviewed: K+: 4.5 (07/19/2009) Creat: : 0.70 (07/19/2009)   Chol: 184 (07/19/2009)   HDL: 56 (07/19/2009)   LDL: 104 (07/19/2009)   TG: 121 (07/19/2009)  Problem # 5:  DIABETES MELLITUS, TYPE II (ICD-250.00) Assessment: Comment Only  Her updated medication list for this problem includes:    Metformin Hcl 1000 Mg Tabs (Metformin hcl) .Marland Kitchen... Take one tab by mouth two times a day with food    Benicar 40 Mg Tabs (Olmesartan medoxomil) .Marland Kitchen... Take 1 tablet by mouth once a day    Lantus Solostar 100 Unit/ml Soln (Insulin glargine) .Marland KitchenMarland KitchenMarland KitchenMarland Kitchen 15 units every morning    Glipizide 10 Mg Tabs (Glipizide) .Marland Kitchen... Take 1 tablet by mouth two times a day    Lantus Solostar 100 Unit/ml Soln (Insulin glargine) .Marland KitchenMarland KitchenMarland KitchenMarland Kitchen 15 units twice daily, at breakfasst, and at bedtime  Orders: T- Hemoglobin A1C ([id number])  Labs Reviewed: Creat: 0.70 (07/19/2009)    Reviewed HgBA1c results: 9.1 (07/16/2009)  6.9 (04/22/2009)  Problem # 6:  HYPERLIPIDEMIA (ICD-272.4) Assessment: Comment Only  Her updated medication list for this problem includes:    Lovastatin 10 Mg Tabs  (Lovastatin) .Marland Kitchen... Take 1 tab by mouth at bedtime  Orders: T-Lipid Profile [phone]) T-Hepatic Function [phone])  Labs Reviewed: SGOT: 12 (07/19/2009)   SGPT: <8 U/L (07/19/2009)   HDL:56 (07/19/2009), 61 (07/12/2008)  LDL:104 (07/19/2009), 123 (07/12/2008)  Chol:184 (07/19/2009), 206 (07/12/2008)  Trig:121 (07/19/2009), 111 (07/12/2008)  Complete Medication List: 1)  Seroquel 50 Mg Tabs (Quetiapine fumarate) .... Take 1 tab by mouth at bedtime 2)  Walker  3)  Metformin Hcl 1000 Mg Tabs (Metformin hcl) .... Take one tab by mouth two times a day with food 4)  Oyster Shell/d 500-200 Mg-unit Tabs (Calcium-vitamin d) .... Take 1 tab by mouth once daily 5)  Benicar 40 Mg Tabs (Olmesartan medoxomil) .... Take 1 tablet by mouth once a day 6)  Norvasc 5 Mg Tabs (Amlodipine besylate) .... Take 1 tablet by mouth once a day 7)  Nexium 40 Mg Cpdr (Esomeprazole magnesium) .... Take 1 tablet by mouth once a day 8)  Enablex  15 Mg Xr24h-tab (Darifenacin hydrobromide) .... One tab by mouth qd 9)  Bd Pen Needle Mini U/f 31g X 5 Mm Misc (Insulin pen needle) .... Use as directed 10)  Onetouch Ultra Test Strp (Glucose blood) .... Uad three times a day 11)  Lantus Solostar 100 Unit/ml Soln (Insulin glargine) .Marland Kitchen.. 15 units every morning 12)  Glipizide 10 Mg Tabs (Glipizide) .... Take 1 tablet by mouth two times a day 13)  Alendronate Sodium 70 Mg Tabs (Alendronate sodium) .... One tablet every monday 14)  Lantus Solostar 100 Unit/ml Soln (Insulin glargine) .Marland Kitchen.. 15 units twice daily, at breakfasst, and at bedtime 15)  Lovastatin 10 Mg Tabs (Lovastatin) .... Take 1 tab by mouth at bedtime 16)  Loratadine 10 Mg Tabs (Loratadine) .... One tab by mouth once daily for allergies 17)  Temazepam 15 Mg Caps (Temazepam) .... Take 1 capsule by mouth at bedtime  Patient Instructions: 1)  Please schedule a follow-up appointment in 3 months. 2)  BMP prior to visit, ICD-9: 3)  Hepatic Panel prior to visit,  ICD-9: 4)  Lipid Panel prior to visit, ICD-9:   today 5)  CBC w/ Diff prior to visit, ICD-9: 6)  HbgA1C prior to visit, ICD-9: 7)  new med for sleep and practice good sleep hygiene Prescriptions: LORATADINE 10 MG TABS (LORATADINE) one tab by mouth once daily for allergies  #30 x 3   Entered by:   Adella Hare LPN   Authorized by:   [name] MD   Signed by:   Adella Hare LPN on 16/03/9603   Method used:   Electronically to        [facility] (retail)       [address] [place], [place]  [address]       Ph: [phone]       Fax: [phone]   RxID:   [id number] LOVASTATIN 10 MG TABS (LOVASTATIN) Take 1 tab by mouth at bedtime  #30 Each x 3   Entered by:   Adella Hare LPN   Authorized by:   [name] MD   Signed by:   Adella Hare LPN on 04/25/2535   Method used:   Electronically to        [facility] (retail)       [address] [place], [place]  [address]       Ph: [phone]       Fax: [phone]   RxID:   [id number] ENABLEX 15 MG XR24H-TAB (DARIFENACIN HYDROBROMIDE) one tab by mouth qd  #30 x 3   Entered by:   Adella Hare LPN   Authorized by:   [name] MD   Signed by:   Adella Hare LPN on 32/35/5732   Method used:   Electronically to        [facility] (retail)       [address]       [place], [place]  [address]       Ph: [phone]       Fax: [phone]   RxID:   [id number] METFORMIN HCL 1000 MG TABS (METFORMIN HCL) take one tab by mouth two times a day with food  #60 x 3   Entered by:   Adella Hare LPN   Authorized by:  [name] MD   Signed by:   Adella Hare LPN on 16/03/9603   Method used:   Electronically to        [facility] (retail)       [address]       [place], [place]  [address]       Ph: [phone]       Fax: [phone]   RxID:    [id number] BENICAR 40 MG TABS (OLMESARTAN MEDOXOMIL) Take 1 tablet by mouth once a day  #30 Each x 3   Entered by:   Adella Hare LPN   Authorized by:   [name] MD   Signed by:   Adella Hare LPN on 04/25/2535   Method used:   Electronically to        [facility] (retail)       [address]       [place], [place]  [address]       Ph: [phone]       Fax: [phone]   RxID:   [id number] TEMAZEPAM 15 MG CAPS (TEMAZEPAM) Take 1 capsule by mouth at bedtime  #30 x 2   Entered and Authorized by:   [name] MD   Signed by:   [name] MD on 12/09/2009   Method used:   Printed then faxed to ...       [facility] (retail)       [address]       [place], [place]  [address]       Ph: [phone]       Fax: [phone]   RxID:   [id number]

## 2010-07-29 NOTE — Assessment & Plan Note (Signed)
Summary: RECERT   Allergies: 1)  ! Pcn   Complete Medication List: 1)  Seroquel 50 Mg Tabs (Quetiapine fumarate) .... Take 1 tab by mouth at bedtime 2)  Walker  3)  Metformin Hcl 1000 Mg Tabs (Metformin hcl) .... Take one tab by mouth two times a day with food 4)  Oyster Shell/d 500-200 Mg-unit Tabs (Calcium-vitamin d) .... Take 1 tab by mouth once daily 5)  Benicar 40 Mg Tabs (Olmesartan medoxomil) .... Take 1 tablet by mouth once a day 6)  Norvasc 5 Mg Tabs (Amlodipine besylate) .... Take 1 tablet by mouth once a day 7)  Nexium 40 Mg Cpdr (Esomeprazole magnesium) .... Take 1 tablet by mouth once a day 8)  Enablex 15 Mg Xr24h-tab (Darifenacin hydrobromide) .... One tab by mouth qd 9)  Bd Pen Needle Mini U/f 31g X 5 Mm Misc (Insulin pen needle) .... Use as directed 10)  Onetouch Ultra Test Strp (Glucose blood) .... Uad three times a day 11)  Lantus Solostar 100 Unit/ml Soln (Insulin glargine) .Marland Kitchen.. 15 units every morning 12)  Glipizide 10 Mg Tabs (Glipizide) .... Take 1 tablet by mouth two times a day 13)  Alendronate Sodium 70 Mg Tabs (Alendronate sodium) .... One tablet every monday 14)  Hydroxyzine Hcl 25 Mg Tabs (Hydroxyzine hcl) .... Take 1 tab by mouth at bedtime  as needed 15)  Lantus Solostar 100 Unit/ml Soln (Insulin glargine) .Marland Kitchen.. 15 units twice daily, at breakfasst, and at bedtime 16)  Lovastatin 10 Mg Tabs (Lovastatin) .... Take 1 tab by mouth at bedtime 17)  Loratadine 10 Mg Tabs (Loratadine) .... One tab by mouth once daily for allergies  Other Orders: Re-certification of Home Health [phone])

## 2010-07-29 NOTE — Miscellaneous (Signed)
Summary: Home Care Report  Home Care Report   Imported By: Lind Guest 07/01/2009 08:33:52  _____________________________________________________________________  External Attachment:    Type:   Image     Comment:   External Document

## 2010-07-29 NOTE — Miscellaneous (Signed)
Summary: Home Care Report  Home Care Report   Imported By: Lind Guest 02/14/2010 13:40:36  _____________________________________________________________________  External Attachment:    Type:   Image     Comment:   External Document

## 2010-07-29 NOTE — Progress Notes (Signed)
Summary: diabetic shoes  Phone Note Call from Patient   Summary of Call: luke from [facility] faxed over  a request for diabetic shoes for this patient   call him back at [phone] Initial call taken by: Lind Guest,  July 10, 2009 1:58 PM  Follow-up for Phone Call        we only respond to these calls or faxes if patient calls and request the supplies Follow-up by: Worthy Keeler LPN,  July 10, 2009 2:26 PM

## 2010-07-29 NOTE — Assessment & Plan Note (Signed)
Summary: RECERT   Allergies: 1)  ! Pcn   Complete Medication List: 1)  Seroquel 50 Mg Tabs (Quetiapine fumarate) .... Take 1 tab by mouth at bedtime 2)  Walker  3)  Metformin Hcl 1000 Mg Tabs (Metformin hcl) .... Take one tab by mouth two times a day with food 4)  Oyster Shell/d 500-200 Mg-unit Tabs (Calcium-vitamin d) .... Take 1 tab by mouth once daily 5)  Benicar 40 Mg Tabs (Olmesartan medoxomil) .... Take 1 tablet by mouth once a day 6)  Norvasc 5 Mg Tabs (Amlodipine besylate) .... Take 1 tablet by mouth once a day 7)  Nexium 40 Mg Cpdr (Esomeprazole magnesium) .... Take 1 tablet by mouth once a day 8)  Enablex 15 Mg Xr24h-tab (Darifenacin hydrobromide) .... One tab by mouth qd 9)  Bd Pen Needle Mini U/f 31g X 5 Mm Misc (Insulin pen needle) .... Use as directed 10)  Onetouch Ultra Test Strp (Glucose blood) .... Uad three times a day 11)  Lantus Solostar 100 Unit/ml Soln (Insulin glargine) .Marland Kitchen.. 15 units every morning 12)  Glipizide 10 Mg Tabs (Glipizide) .... Take 1 tablet by mouth two times a day 13)  Alendronate Sodium 70 Mg Tabs (Alendronate sodium) .... One tablet every monday 14)  Lantus Solostar 100 Unit/ml Soln (Insulin glargine) .Marland Kitchen.. 15 units twice daily, at breakfasst, and at bedtime 15)  Lovastatin 10 Mg Tabs (Lovastatin) .... Take 1 tab by mouth at bedtime 16)  Loratadine 10 Mg Tabs (Loratadine) .... One tab by mouth once daily for allergies 17)  Restoril 30 Mg Caps (Temazepam) .... Take 1 capsule by mouth at bedtime 18)  Doxycycline Hyclate 100 Mg Solr (Doxycycline hyclate) .... Take one two times a day x 7 days  Other Orders: Re-certification of Home Health [phone])   Orders Added: 1)  Re-certification of Home Health [G0179]

## 2010-07-29 NOTE — Miscellaneous (Signed)
Summary: letter/ medicines  letter/ medicines   Imported By: Lind Guest 10/07/2009 12:57:09  _____________________________________________________________________  External Attachment:    Type:   Image     Comment:   External Document

## 2010-07-29 NOTE — Letter (Signed)
Summary: cap services  cap services   Imported By: Lind Guest 02/05/2010 10:18:59  _____________________________________________________________________  External Attachment:    Type:   Image     Comment:   External Document

## 2010-07-29 NOTE — Assessment & Plan Note (Signed)
Summary: RECERT   Allergies: 1)  ! Pcn   Complete Medication List: 1)  Seroquel 50 Mg Tabs (Quetiapine fumarate) .... Take 1 tab by mouth at bedtime 2)  Walker  3)  Metformin Hcl 1000 Mg Tabs (Metformin hcl) .... Take one tab by mouth two times a day with food 4)  Oyster Shell/d 500-200 Mg-unit Tabs (Calcium-vitamin d) .... Take 1 tab by mouth once daily 5)  Benicar 40 Mg Tabs (Olmesartan medoxomil) .... Take 1 tablet by mouth once a day 6)  Norvasc 5 Mg Tabs (Amlodipine besylate) .... Take 1 tablet by mouth once a day 7)  Nexium 40 Mg Cpdr (Esomeprazole magnesium) .... Take 1 tablet by mouth once a day 8)  Enablex 15 Mg Xr24h-tab (Darifenacin hydrobromide) .... One tab by mouth qd 9)  Bd Pen Needle Mini U/f 31g X 5 Mm Misc (Insulin pen needle) .... Use as directed 10)  Onetouch Ultra Test Strp (Glucose blood) .... Uad three times a day 11)  Lantus Solostar 100 Unit/ml Soln (Insulin glargine) .Marland Kitchen.. 15 units every morning 12)  Glipizide 10 Mg Tabs (Glipizide) .... Take 1 tablet by mouth two times a day 13)  Alendronate Sodium 70 Mg Tabs (Alendronate sodium) .... One tablet every monday 14)  Lantus Solostar 100 Unit/ml Soln (Insulin glargine) .Marland Kitchen.. 15 units twice daily, at breakfasst, and at bedtime 15)  Lovastatin 10 Mg Tabs (Lovastatin) .... Take 1 tab by mouth at bedtime 16)  Loratadine 10 Mg Tabs (Loratadine) .... One tab by mouth once daily for allergies 17)  Temazepam 15 Mg Caps (Temazepam) .... Take 1 capsule by mouth at bedtime  Other Orders: Re-certification of Home Health [phone])

## 2010-07-29 NOTE — Miscellaneous (Signed)
Summary: Home Care Report  Home Care Report   Imported By: Lind Guest 01/09/2010 14:41:16  _____________________________________________________________________  External Attachment:    Type:   Image     Comment:   External Document

## 2010-07-29 NOTE — Miscellaneous (Signed)
Summary: Home Care Report  Home Care Report   Imported By: Lind Guest 11/26/2009 08:45:16  _____________________________________________________________________  External Attachment:    Type:   Image     Comment:   External Document

## 2010-07-29 NOTE — Progress Notes (Signed)
  Phone Note Call from Patient   Caller: Patient Summary of Call: patient walked in office stating she feels bad and hasnt checked sugar in days, out of strips sent strips to pharmacy, checked sugar for patient = 230 patient was also scheduled an appt and aware strips are at pharmacy Initial call taken by: Adella Hare LPN,  June 02, 2010 11:54 AM

## 2010-07-29 NOTE — Assessment & Plan Note (Signed)
Summary: RECERT   Allergies: 1)  ! Pcn   Complete Medication List: 1)  Seroquel 50 Mg Tabs (Quetiapine fumarate) .... Take 1 tab by mouth at bedtime 2)  Walker  3)  Metformin Hcl 1000 Mg Tabs (Metformin hcl) .... Take one tab by mouth two times a day with food 4)  Oyster Shell/d 500-200 Mg-unit Tabs (Calcium-vitamin d) .... Take 1 tab by mouth once daily 5)  Benicar 40 Mg Tabs (Olmesartan medoxomil) .... Take 1 tablet by mouth once a day 6)  Norvasc 5 Mg Tabs (Amlodipine besylate) .... Take 1 tablet by mouth once a day 7)  Nexium 40 Mg Cpdr (Esomeprazole magnesium) .... Take 1 tablet by mouth once a day 8)  Enablex 15 Mg Xr24h-tab (Darifenacin hydrobromide) .... One tab by mouth qd 9)  Bd Pen Needle Mini U/f 31g X 5 Mm Misc (Insulin pen needle) .... Use as directed 10)  Onetouch Ultra Test Strp (Glucose blood) .... Uad three times a day 11)  Lantus Solostar 100 Unit/ml Soln (Insulin glargine) .Marland Kitchen.. 15 units every morning 12)  Glipizide 10 Mg Tabs (Glipizide) .... Take 1 tablet by mouth two times a day 13)  Alendronate Sodium 70 Mg Tabs (Alendronate sodium) .... One tablet every monday 14)  Hydroxyzine Hcl 25 Mg Tabs (Hydroxyzine hcl) .... Take 1 tab by mouth at bedtime  as needed 15)  Lantus Solostar 100 Unit/ml Soln (Insulin glargine) .Marland Kitchen.. 15 units twice daily, at breakfasst, and at bedtime 16)  Lovastatin 10 Mg Tabs (Lovastatin) .... Take 1 tab by mouth at bedtime 17)  Loratadine 10 Mg Tabs (Loratadine) .... One tab by mouth once daily for allergies form reviewed and recertification completed

## 2010-07-29 NOTE — Progress Notes (Signed)
Summary: HEADACHE AND APPETITE  Phone Note Call from Patient   Summary of Call: NEEDS SOMETHING FOR HER HEADACHE AND APPETITE CALL BACK Initial call taken by: Lind Guest,  April 16, 2010 8:41 AM  Follow-up for Phone Call        Patient requests medication for headache and something to help increase her appetite. Follow-up by: Mauricia Area CMA,  April 16, 2010 8:59 AM  Additional Follow-up for Phone Call Additional follow up Details #1::        pls advise opv and sched when there is an openiong, she needs to bring all meds also Additional Follow-up by: [name] MD,  April 16, 2010 12:25 PM    Additional Follow-up for Phone Call Additional follow up Details #2::    coming 10.20.11 to see dawn Follow-up by: Lind Guest,  April 16, 2010 1:10 PM

## 2010-07-29 NOTE — Progress Notes (Signed)
Summary: sick  Phone Note Call from Patient   Summary of Call: has a bad cold and nurse want go get meds. wants dr. Lodema Hong to know. [phone] Initial call taken by: Rudene Anda,  July 26, 2009 10:07 AM  Follow-up for Phone Call        i spoke with patient reguarding this and advised her there is nothing we can do about this matter, patient is upset because her nurse will not pick up otc cough med, i am assuming because she doesnt fill patient is in need and doesnt want her to abuse the med Follow-up by: Worthy Keeler LPN,  July 26, 2009 10:44 AM  Additional Follow-up for Phone Call Additional follow up Details #1::        noted Additional Follow-up by: [name] MD,  July 26, 2009 12:21 PM

## 2010-07-29 NOTE — Assessment & Plan Note (Signed)
Summary: office visit   Vital Signs:  Patient profile:   77 year old female Menstrual status:  postmenopausal Height:      66.5 inches Weight:      186.50 pounds BMI:     29.76 O2 Sat:      97 % on Room air Pulse rate:   96 / minute Pulse rhythm:   regular Resp:     16 per minute BP sitting:   114 / 70  (left arm)  Vitals Entered By: Adella Hare LPN (March 11, 2010 11:07 AM)  Nutrition Counseling: Patient's BMI is greater than 25 and therefore counseled on weight management options.  O2 Flow:  Room air CC: follow-up visit Is Patient Diabetic? Yes Did you bring your meter with you today? No Pain Assessment Patient in pain? no      Comments did not bring meds to ov   Primary Care Provider:  [name] MD  CC:  follow-up visit.  History of Present Illness: Reports  that she has beendoing well Denies recent fever or chills. Denies sinus pressure, nasal congestion , ear pain or sore throat. Denies chest congestion, or cough productive of sputum. Denies chest pain, palpitations, PND, orthopnea or leg swelling. Denies abdominal pain, nausea, vomitting, diarrhea or constipation. Denies change in bowel movements or bloody stool. Denies dysuria , frequency, incontinence or hesitancy. Denies  joint pain, swelling, or reduced mobility. Denies headaches, vertigo, seizures. Denies depression or  anxiety but c/o  insomnia. Denies  rash, lesions, or itch.     Allergies (verified): 1)  ! Pcn  Review of Systems      See HPI General:  Complains of fatigue and sleep disorder. Eyes:  Denies discharge, eye pain, and red eye. MS:  Complains of joint pain and stiffness. Endo:  Denies excessive thirst and excessive urination; tersts 2 to 3 times daily numbn , pt has log. Heme:  Denies abnormal bruising and bleeding. Allergy:  Denies hives or rash and itching eyes.  Physical Exam  General:  Well-developed,obese,in no acute distress; alert,appropriate and cooperative  throughout examination HEENT: No facial asymmetry,  EOMI, No sinus tenderness, TM's Clear, oropharynx  pink and moist.   Chest: Clear to auscultation bilaterally.  CVS: S1, S2, No murmurs, No S3.   Abd: Soft, Nontender.  MS: Adequate ROM spine, hips, shoulders and knees.  Ext: No edema.   CNS: CN 2-12 intact, power tone and sensation normal throughout.   Skin: Intact, no visible lesions or rashes.  Psych: Good eye contact, normal affect.  Memory loss, not anxious or depressed appearing.    Impression & Recommendations:  Problem # 1:  OBESITY, UNSPECIFIED (ICD-278.00) Assessment Improved  Ht: 66.5 (03/11/2010)   Wt: 186.50 (03/11/2010)   BMI: 29.76 (03/11/2010) therapeutic lifestyle change discussed and encouraged  Problem # 2:  INSOMNIA (ICD-780.52) Assessment: Unchanged  The following medications were removed from the medication list:    Temazepam 15 Mg Caps (Temazepam) .Marland Kitchen... Take 1 capsule by mouth at bedtime Her updated medication list for this problem includes:    Restoril 30 Mg Caps (Temazepam) .Marland Kitchen... Take 1 capsule by mouth at bedtime  Discussed sleep hygiene.   Problem # 3:  HYPERTENSION (ICD-401.9) Assessment: Unchanged  Her updated medication list for this problem includes:    Benicar 40 Mg Tabs (Olmesartan medoxomil) .Marland Kitchen... Take 1 tablet by mouth once a day    Norvasc 5 Mg Tabs (Amlodipine besylate) .Marland Kitchen... Take 1 tablet by mouth once a day  Orders: T-Basic  Metabolic Panel [phone])  BP today: 114/70 Prior BP: 120/80 (12/09/2009)  Labs Reviewed: K+: 5.0 (12/10/2009) Creat: : 0.85 (12/10/2009)   Chol: 184 (07/19/2009)   HDL: 56 (07/19/2009)   LDL: 104 (07/19/2009)   TG: 121 (07/19/2009)  Problem # 4:  DIABETES MELLITUS, TYPE II (ICD-250.00) Assessment: Comment Only  Her updated medication list for this problem includes:    Metformin Hcl 1000 Mg Tabs (Metformin hcl) .Marland Kitchen... Take one tab by mouth two times a day with food    Benicar 40 Mg Tabs (Olmesartan  medoxomil) .Marland Kitchen... Take 1 tablet by mouth once a day    Lantus Solostar 100 Unit/ml Soln (Insulin glargine) .Marland KitchenMarland KitchenMarland KitchenMarland Kitchen 15 units every morning    Glipizide 10 Mg Tabs (Glipizide) .Marland Kitchen... Take 1 tablet by mouth two times a day    Lantus Solostar 100 Unit/ml Soln (Insulin glargine) .Marland KitchenMarland KitchenMarland KitchenMarland Kitchen 15 units twice daily, at breakfasst, and at bedtime Patient advised to reduce carbs and sweets, commit to regular physical activity, take meds as prescribed, test blood sugars as directed, and attempt to lose weight , to improve blood sugar control.   Orders: T- Hemoglobin A1C ([id number])  Labs Reviewed: Creat: 0.85 (12/10/2009)    Reviewed HgBA1c results: 6.8 (12/10/2009)  9.1 (07/16/2009)  Problem # 5:  HYPERLIPIDEMIA (ICD-272.4)  Her updated medication list for this problem includes:    Lovastatin 10 Mg Tabs (Lovastatin) .Marland Kitchen... Take 1 tab by mouth at bedtime Low fat dietdiscussed and encouraged  Orders: T-Hepatic Function [id number]) T-Lipid Profile [id number])  Labs Reviewed: SGOT: 14 (12/10/2009)   SGPT: 11 (12/10/2009)   HDL:56 (07/19/2009), 61 (07/12/2008)  LDL:104 (07/19/2009), 123 (07/12/2008)  Chol:184 (07/19/2009), 206 (07/12/2008)  Trig:121 (07/19/2009), 111 (07/12/2008)  Complete Medication List: 1)  Seroquel 50 Mg Tabs (Quetiapine fumarate) .... Take 1 tab by mouth at bedtime 2)  Walker  3)  Metformin Hcl 1000 Mg Tabs (Metformin hcl) .... Take one tab by mouth two times a day with food 4)  Oyster Shell/d 500-200 Mg-unit Tabs (Calcium-vitamin d) .... Take 1 tab by mouth once daily 5)  Benicar 40 Mg Tabs (Olmesartan medoxomil) .... Take 1 tablet by mouth once a day 6)  Norvasc 5 Mg Tabs (Amlodipine besylate) .... Take 1 tablet by mouth once a day 7)  Nexium 40 Mg Cpdr (Esomeprazole magnesium) .... Take 1 tablet by mouth once a day 8)  Enablex 15 Mg Xr24h-tab (Darifenacin hydrobromide) .... One tab by mouth qd 9)  Bd Pen Needle Mini U/f 31g X 5 Mm Misc (Insulin pen needle) .... Use as  directed 10)  Onetouch Ultra Test Strp (Glucose blood) .... Uad three times a day 11)  Lantus Solostar 100 Unit/ml Soln (Insulin glargine) .Marland Kitchen.. 15 units every morning 12)  Glipizide 10 Mg Tabs (Glipizide) .... Take 1 tablet by mouth two times a day 13)  Alendronate Sodium 70 Mg Tabs (Alendronate sodium) .... One tablet every monday 14)  Lantus Solostar 100 Unit/ml Soln (Insulin glargine) .Marland Kitchen.. 15 units twice daily, at breakfasst, and at bedtime 15)  Lovastatin 10 Mg Tabs (Lovastatin) .... Take 1 tab by mouth at bedtime 16)  Loratadine 10 Mg Tabs (Loratadine) .... One tab by mouth once daily for allergies 17)  Restoril 30 Mg Caps (Temazepam) .... Take 1 capsule by mouth at bedtime  Other Orders: Influenza Vaccine NON MCR ([id number])  Patient Instructions: 1)  Please schedule a follow-up appointment in 4 months. 2)  BMP prior to visit, ICD-9: 3)  Hepatic Panel prior to visit, ICD-9:  fasting tomorrow  4)  Lipid Panel prior to visit, ICD-9: 5)  HbgA1C prior to visit, ICD-9: 6)  You are doing well Prescriptions: RESTORIL 30 MG CAPS (TEMAZEPAM) Take 1 capsule by mouth at bedtime  #30 x 4   Entered and Authorized by:   [name] MD   Signed by:   [name] MD on 03/11/2010   Method used:   Printed then faxed to ...       [facility] (retail)       [address]       [place], [place]  [address]       Ph: [phone]       Fax: [phone]   RxID:   [id number]    Influenza Vaccine    Vaccine Type: Fluvax Non-MCR    Site: right deltoid    Mfr: novartis    Dose: 0.5 ml    Route: IM    Given by: Adella Hare LPN    Exp. Date: 10/2010    Lot #: [id number]    VIS given: 01/21/10 version given March 11, 2010.

## 2010-07-29 NOTE — Progress Notes (Signed)
Summary: meds  Phone Note Call from Patient   Summary of Call: pt needs to have [facility] to deliever her meds. her aid lost her job on friday. [phone] Initial call taken by: Rudene Anda,  Nov 18, 2009 8:27 AM  Follow-up for Phone Call        advised patient to contact pharmacy reguarding this Follow-up by: Adella Hare LPN,  Nov 18, 2009 10:11 AM

## 2010-07-29 NOTE — Progress Notes (Signed)
Summary: NO APPETITE  Phone Note Call from Patient   Summary of Call: SHARON WALKER FROM [facility] CALLED LEFT MESSAGE THAT SHE DID A RECERT ON CAPP PROGRAM FOR [name] AND THAT [name] DOES NOT HAVE A APPETITE AND COULD YOU CALL IN SOMETHING TO [name] [facility] AND TO CALL SHARON BACK AT [phone] Initial call taken by: Lind Guest,  April 15, 2010 1:09 PM  Follow-up for Phone Call        i recommend 1 mutlitvitamin daily, this is oTC, no prescription needed, and weekly weighs needs ov in approx 1 month for eval of weightt Follow-up by: [name] MD,  April 15, 2010 4:52 PM  Additional Follow-up for Phone Call Additional follow up Details #1::        sharon aware Additional Follow-up by: Adella Hare LPN,  April 16, 2010 1:25 PM

## 2010-07-30 ENCOUNTER — Ambulatory Visit (HOSPITAL_COMMUNITY): Payer: Self-pay

## 2010-07-30 ENCOUNTER — Encounter: Payer: Self-pay | Admitting: Family Medicine

## 2010-07-31 ENCOUNTER — Ambulatory Visit (HOSPITAL_COMMUNITY): Payer: Self-pay

## 2010-07-31 ENCOUNTER — Ambulatory Visit (HOSPITAL_COMMUNITY)
Admission: RE | Admit: 2010-07-31 | Discharge: 2010-07-31 | Disposition: A | Discharge status: Home / Self Care | Payer: Medicare HMO | Source: Ambulatory Visit | Attending: Family Medicine | Admitting: Family Medicine

## 2010-07-31 DIAGNOSIS — Z1231 Encounter for screening mammogram for malignant neoplasm of breast: Secondary | ICD-10-CM | POA: Insufficient documentation

## 2010-07-31 DIAGNOSIS — Z139 Encounter for screening, unspecified: Secondary | ICD-10-CM

## 2010-07-31 NOTE — Assessment & Plan Note (Signed)
Summary: ov   Vital Signs:  Patient profile:   77 year old female Menstrual status:  postmenopausal Height:      66.5 inches Weight:      192.25 pounds BMI:     30.68 O2 Sat:      99 % on Room air Pulse rate:   112 / minute Pulse rhythm:   regular Resp:     16 per minute BP sitting:   140 / 80  (left arm)  Vitals Entered By: Adella Hare LPN (June 03, 2010 10:38 AM)  Nutrition Counseling: Patient's BMI is greater than 25 and therefore counseled on weight management options.  O2 Flow:  Room air CC: "feels bad"  Is Patient Diabetic? Yes Did you bring your meter with you today? No   Primary Care Provider:  [name] MD  CC:  "feels bad" .  History of Present Illness: Reports  that  she has not been doing very well, and generally just "feel bad" in the past 1 week. She is reporting  headache and poor sleep. Denies recent fever or chills. Denies sinus pressure, nasal congestion , ear pain or sore throat. Denies chest congestion, or cough productive of sputum. Denies chest pain, palpitations, PND, orthopnea or leg swelling. Denies abdominal pain, nausea, vomitting, diarrhea or constipation. Denies change in bowel movements or bloody stool. Denies dysuria , frequency, incontinence or hesitancy. Denies  joint pain, swelling, or reduced mobility. Denies h vertigo, seizures. Denies depression or  anxiety  Denies  rash, lesions, or itch.     Current Medications (verified): 1)  Walker 2)  Metformin Hcl 1000 Mg Tabs (Metformin Hcl) .... Take One Tab By Mouth Two Times A Day With Food 3)  Oyster Shell/d 500-200 Mg-Unit Tabs (Calcium-Vitamin D) .... Take 1 Tab By Mouth Once Daily 4)  Norvasc 5 Mg Tabs (Amlodipine Besylate) .... Take 1 Tablet By Mouth Once A Day 5)  Nexium 40 Mg Cpdr (Esomeprazole Magnesium) .... Take 1 Tablet By Mouth Once A Day 6)  Enablex 15 Mg Xr24h-Tab (Darifenacin Hydrobromide) .... One Tab By Mouth Qd 7)  Onetouch Ultra Test  Strp (Glucose  Blood) .... Uad Three Times A Day 8)  Lovastatin 10 Mg Tabs (Lovastatin) .... Take 1 Tab By Mouth At Bedtime 9)  Restoril 30 Mg Caps (Temazepam) .... Take 1 Capsule By Mouth At Bedtime  Allergies (verified): 1)  ! Pcn  Review of Systems      See HPI General:  Complains of fatigue. MS:  Complains of joint pain, low back pain, mid back pain, and stiffness. Neuro:  Complains of headaches; increased frequency and severity of frontal tight band in the past week, esopescially, sleep is poor and she is worried. Endo:  Denies excessive thirst and excessive urination. Heme:  Denies abnormal bruising and bleeding. Allergy:  Complains of seasonal allergies; runny nose and cough.  Physical Exam  General:  Well-developed,well-nourished,in no acute distress; alert,appropriate and cooperative throughout examination HEENT: No facial asymmetry,  EOMI, No sinus tenderness, TM's Clear, oropharynx  pink and moist.   Chest: Clear to auscultation bilaterally.  CVS: S1, S2, No murmurs, No S3.   Abd: Soft, Nontender.  MS: Adequate ROM spine, hips, shoulders and knees.  Ext: No edema.   CNS: CN 2-12 intact, power tone and sensation normal throughout.   Skin: Intact, no visible lesions or rashes.  Psych: Good eye contact, normal affect.  Memory loss, not anxious or depressed appearing.   Diabetes Management Exam:  Foot Exam (with socks and/or shoes not present):       Sensory-Monofilament:          Left foot: diminished          Right foot: diminished       Inspection:          Left foot: normal          Right foot: normal       Nails:          Left foot: thickened          Right foot: thickened   Impression & Recommendations:  Problem # 1:  HEADACHE (ICD-784.0) Assessment Deteriorated  Orders: Ketorolac-Toradol 15mg  (Z6109) Admin of Therapeutic Inj  intramuscular or subcutaneous ([id number])  Problem # 2:  DIABETES MELLITUS, TYPE II (ICD-250.00) Assessment: Comment Only  The following  medications were removed from the medication list:    Benicar 40 Mg Tabs (Olmesartan medoxomil) .Marland Kitchen... Take 1 tablet by mouth once a day    Lantus Solostar 100 Unit/ml Soln (Insulin glargine) .Marland KitchenMarland KitchenMarland KitchenMarland Kitchen 15 units every morning    Glipizide 10 Mg Tabs (Glipizide) .Marland Kitchen... Take 1 tablet by mouth two times a day    Lantus Solostar 100 Unit/ml Soln (Insulin glargine) .Marland KitchenMarland KitchenMarland KitchenMarland Kitchen 15 units twice daily, at breakfasst, and at bedtime Her updated medication list for this problem includes:    Metformin Hcl 1000 Mg Tabs (Metformin hcl) .Marland Kitchen... Take one tab by mouth two times a day with food Patient advised to reduce carbs and sweets, commit to regular physical activity, take meds as prescribed, test blood sugars as directed, and attempt to lose weight , to improve blood sugar control.  Labs Reviewed: Creat: 0.80 (03/12/2010)    Reviewed HgBA1c results: 6.7 (03/12/2010)  6.8 (12/10/2009)  Problem # 3:  HYPERLIPIDEMIA (ICD-272.4) Assessment: Comment Only  Her updated medication list for this problem includes:    Lovastatin 10 Mg Tabs (Lovastatin) .Marland Kitchen... Take 1 tab by mouth at bedtime Low fat dietdiscussed and encouraged  Labs Reviewed: SGOT: 12 (03/12/2010)   SGPT: <8 U/L (03/12/2010)   HDL:49 (03/12/2010), 56 (07/19/2009)  LDL:58 (03/12/2010), 104 (09/81/1914)  Chol:125 (03/12/2010), 184 (07/19/2009)  Trig:91 (03/12/2010), 121 (07/19/2009)  Problem # 4:  INSOMNIA (ICD-780.52) Assessment: Unchanged  Her updated medication list for this problem includes:    Restoril 30 Mg Caps (Temazepam) .Marland Kitchen... Take 1 capsule by mouth at bedtime  Discussed sleep hygiene.   Complete Medication List: 1)  Walker  2)  Metformin Hcl 1000 Mg Tabs (Metformin hcl) .... Take one tab by mouth two times a day with food 3)  Oyster Shell/d 500-200 Mg-unit Tabs (Calcium-vitamin d) .... Take 1 tab by mouth once daily 4)  Norvasc 5 Mg Tabs (Amlodipine besylate) .... Take 1 tablet by mouth once a day 5)  Nexium 40 Mg Cpdr (Esomeprazole  magnesium) .... Take 1 tablet by mouth once a day 6)  Enablex 15 Mg Xr24h-tab (Darifenacin hydrobromide) .... One tab by mouth qd 7)  Onetouch Ultra Test Strp (Glucose blood) .... Uad three times a day 8)  Lovastatin 10 Mg Tabs (Lovastatin) .... Take 1 tab by mouth at bedtime 9)  Restoril 30 Mg Caps (Temazepam) .... Take 1 capsule by mouth at bedtime 10)  Tessalon Perles 100 Mg Caps (Benzonatate) .... Take 1 capsule by mouth three times a day  Other Orders: Medicare Electronic Prescription [phone]) Radiology Referral (Radiology) T-Basic Metabolic Panel [phone]) T- Hemoglobin A1C ([id number])  Patient Instructions: 1)  F/U mid to end Jan ,  if earlier pls cancel. 2)  You are referred for a mamogram. 3)  You will get an injection for headache 4)  BMP prior to visit, ICD-9: 5)  HbgA1C prior to visit, ICD-9:  mid to end January Prescriptions: ENABLEX 15 MG XR24H-TAB (DARIFENACIN HYDROBROMIDE) one tab by mouth qd  #30 x 3   Entered by:   Adella Hare LPN   Authorized by:   [name] MD   Signed by:   Adella Hare LPN on 16/03/9603   Method used:   Electronically to        [facility] (retail)       [address] [place], [place]  [address]       Ph: [phone]       Fax: [phone]   RxID:   [id number] OYSTER SHELL/D 500-200 MG-UNIT TABS (CALCIUM-VITAMIN D) Take 1 tab by mouth once daily  #30 x 3   Entered by:   Adella Hare LPN   Authorized by:   [name] MD   Signed by:   Adella Hare LPN on 04/25/2535   Method used:   Electronically to        [facility] (retail)       [address] [place], [place]  [address]       Ph: [phone]       Fax: [phone]   RxID:   [id number] TESSALON PERLES 100 MG CAPS (BENZONATATE) Take 1 capsule by mouth three times a day  #30 x 0   Entered and Authorized by:   [name] MD   Signed by:   [name] MD on  06/03/2010   Method used:   Electronically to        [facility] (retail)       [address] [place], [place]  [address]       Ph: [phone]       Fax: [phone]   RxID:   [id number]    Medication Administration  Injection # 1:    Medication: Ketorolac-Toradol 15mg     Diagnosis: HEADACHE (ICD-784.0)    Route: IM    Site: RUOQ gluteus    Exp Date: 04/30/2011    Lot #: [id number]    Mfr: [email or web address]    Comments: toradol 60mg  given    Patient tolerated injection without complications    Given by: Adella Hare LPN (June 03, 2010 11:49 AM)  Orders Added: 1)  Est. Patient Level IV [03500] 2)  Medicare Electronic Prescription [G8553] 3)  Radiology Referral [Radiology] 4)  T-Basic Metabolic Panel [[id number]] 5)  T- Hemoglobin A1C [[id number]] 6)  Ketorolac-Toradol 15mg  [J1885] 7)  Admin of Therapeutic Inj  intramuscular or subcutaneous [[id number]]     Medication Administration  Injection # 1:    Medication: Ketorolac-Toradol 15mg     Diagnosis: HEADACHE (ICD-784.0)    Route: IM    Site: RUOQ gluteus    Exp Date: 04/30/2011    Lot #: [id number]    Mfr: [email or web address]    Comments: toradol 60mg  given    Patient tolerated injection without complications    Given by: Adella Hare LPN (June 03, 2010 11:49 AM)  Orders Added: 1)  Est. Patient Level IV [93716] 2)  Medicare  Electronic Prescription [G8553] 3)  Radiology Referral [Radiology] 4)  T-Basic Metabolic Panel [[id number]] 5)  T- Hemoglobin A1C [[id number]] 6)  Ketorolac-Toradol 15mg  [J1885] 7)  Admin of Therapeutic Inj  intramuscular or subcutaneous [60454]

## 2010-07-31 NOTE — Assessment & Plan Note (Signed)
Summary: recert   Allergies: 1)  ! Pcn   Complete Medication List: 1)  Walker  2)  Metformin Hcl 1000 Mg Tabs (Metformin hcl) .... Take one tab by mouth two times a day with food 3)  Oyster Shell/d 500-200 Mg-unit Tabs (Calcium-vitamin d) .... Take 1 tab by mouth once daily 4)  Norvasc 5 Mg Tabs (Amlodipine besylate) .... Take 1 tablet by mouth once a day 5)  Nexium 40 Mg Cpdr (Esomeprazole magnesium) .... Take 1 tablet by mouth once a day 6)  Enablex 15 Mg Xr24h-tab (Darifenacin hydrobromide) .... One tab by mouth qd 7)  Onetouch Ultra Test Strp (Glucose blood) .... Uad three times a day 8)  Lovastatin 10 Mg Tabs (Lovastatin) .... Take 1 tab by mouth at bedtime 9)  Restoril 30 Mg Caps (Temazepam) .... Take 1 capsule by mouth at bedtime 10)  Tessalon Perles 100 Mg Caps (Benzonatate) .... Take 1 capsule by mouth three times a day  Other Orders: Re-certification of Home Health [phone])   Orders Added: 1)  Re-certification of Home Health [G0179]

## 2010-07-31 NOTE — Miscellaneous (Signed)
Summary: Home Care Report  Home Care Report   Imported By: Lind Guest 06/18/2010 11:03:19  _____________________________________________________________________  External Attachment:    Type:   Image     Comment:   External Document

## 2010-08-06 NOTE — Assessment & Plan Note (Signed)
Summary: f up   Vital Signs:  Patient profile:   77 year old female Menstrual status:  postmenopausal Height:      66.5 inches Weight:      194.25 pounds BMI:     30.99 O2 Sat:      98 % Pulse rate:   114 / minute Pulse rhythm:   irregular Resp:     16 per minute BP sitting:   140 / 70  (left arm)  Vitals Entered By: Adella Hare LPN (July 29, 2010 10:08 AM)  Nutrition Counseling: Patient's BMI is greater than 25 and therefore counseled on weight management options. CC: follow-up visit Is Patient Diabetic? Yes Comments did not bring meds to ov   Primary Care Provider:  [name] MD  CC:  follow-up visit.  History of Present Illness: Reports  that she is doing fairly well, except that she continues to have difficulty sleeping despite medication Denies recent fever or chills. Denies sinus pressure, nasal congestion , ear pain or sore throat. Denies chest congestion, or cough productive of sputum. Denies chest pain, palpitations, PND, orthopnea or leg swelling. Denies abdominal pain, nausea, vomitting, diarrhea or constipation. Denies change in bowel movements or bloody stool. Denies dysuria , frequency, incontinence or hesitancy. Denies  joint pain, swelling, or reduced mobility. Denies headaches, vertigo, seizures. Denies uncontrolled depression or  anxiety her  insomniais not good Denies  rash, lesions, or itch.      Current Medications (verified): 1)  Walker 2)  Metformin Hcl 1000 Mg Tabs (Metformin Hcl) .... Take One Tab By Mouth Two Times A Day With Food 3)  Oyster Shell/d 500-200 Mg-Unit Tabs (Calcium-Vitamin D) .... Take 1 Tab By Mouth Once Daily 4)  Norvasc 5 Mg Tabs (Amlodipine Besylate) .... Take 1 Tablet By Mouth Once A Day 5)  Nexium 40 Mg Cpdr (Esomeprazole Magnesium) .... Take 1 Tablet By Mouth Once A Day 6)  Enablex 15 Mg Xr24h-Tab (Darifenacin Hydrobromide) .... One Tab By Mouth Qd 7)  Onetouch Ultra Test  Strp (Glucose Blood) .... Uad Three  Times A Day 8)  Lovastatin 10 Mg Tabs (Lovastatin) .... Take 1 Tab By Mouth At Bedtime 9)  Restoril 30 Mg Caps (Temazepam) .... Take 1 Capsule By Mouth At Bedtime 10)  Tessalon Perles 100 Mg Caps (Benzonatate) .... Take 1 Capsule By Mouth Three Times A Day 11)  Hydroxyzine Hcl 25 Mg Tabs (Hydroxyzine Hcl) .... Take 1 Tab By Mouth At Bedtime 12)  Lantus Solostar 100 Unit/ml Soln (Insulin Glargine) .Marland Kitchen.. 15 Units Two Times A Day At Breakfast and Bedtime  Allergies (verified): 1)  ! Pcn  Review of Systems      See HPI General:  Complains of malaise and sleep disorder; sleeps only 3 hrs. Endo:  Denies excessive thirst and excessive urination; tests 3 times daily and has her log, fastings are between 140 to160, 4pm in the afternoon, she eats lunch around 12 midday 200 to 250, and bedtime 150 to 220. Heme:  Denies abnormal bruising and bleeding. Allergy:  Complains of seasonal allergies; denies hives or rash, itching eyes, and persistent infections.  Physical Exam  General:  Well-developed,well-nourished,in no acute distress; alert,appropriate and cooperative throughout examination HEENT: No facial asymmetry,  EOMI, No sinus tenderness, TM's Clear, oropharynx  pink and moist.   Chest: Clear to auscultation bilaterally.  CVS: S1, S2, No murmurs, No S3.   Abd: Soft, Nontender.  MS: Adequate ROM spine, hips, shoulders and knees.  Ext: No edema.  CNS: CN 2-12 intact, power tone and sensation normal throughout.   Skin: Intact, no visible lesions or rashes.  Psych: Good eye contact, normal affect.  Memory loss, not anxious or depressed appearing.    Impression & Recommendations:  Problem # 1:  HYPERLIPIDEMIA (ICD-272.4) Assessment Comment Only  Her updated medication list for this problem includes:    Lovastatin 10 Mg Tabs (Lovastatin) .Marland Kitchen... Take 1 tab by mouth at bedtime Low fat dietdiscussed and encouraged  Orders: T-Hepatic Function [phone]) T-Lipid Profile  [id number])  Labs Reviewed: SGOT: 12 (03/12/2010)   SGPT: <8 U/L (03/12/2010)   HDL:49 (03/12/2010), 56 (07/19/2009)  LDL:58 (03/12/2010), 104 (29/56/2130)  Chol:125 (03/12/2010), 184 (07/19/2009)  Trig:91 (03/12/2010), 121 (07/19/2009)  Problem # 2:  HYPERTENSION (ICD-401.9) Assessment: Unchanged  Her updated medication list for this problem includes:    Norvasc 5 Mg Tabs (Amlodipine besylate) .Marland Kitchen... Take 1 tablet by mouth once a day  BP today: 140/70 Prior BP: 140/80 (06/03/2010)  Labs Reviewed: K+: 4.8 (03/12/2010) Creat: : 0.80 (03/12/2010)   Chol: 125 (03/12/2010)   HDL: 49 (03/12/2010)   LDL: 58 (03/12/2010)   TG: 91 (03/12/2010)  Problem # 3:  DEPRESSION (ICD-311) Assessment: Improved  Her updated medication list for this problem includes:    Hydroxyzine Hcl 25 Mg Tabs (Hydroxyzine hcl) .Marland Kitchen... Take 1 tab by mouth at bedtime  Problem # 4:  DIABETES MELLITUS, TYPE II (ICD-250.00) Assessment: Comment Only  Her updated medication list for this problem includes:    Metformin Hcl 1000 Mg Tabs (Metformin hcl) .Marland Kitchen... Take one tab by mouth two times a day with food    Lantus Solostar 100 Unit/ml Soln (Insulin glargine) .Marland KitchenMarland KitchenMarland KitchenMarland Kitchen 15 units two times a day at breakfast and bedtime Patient advised to reduce carbs and sweets, commit to regular physical activity, take meds as prescribed, test blood sugars as directed, and attempt to lose weight , to improve blood sugar control.  Orders: T-Urine Microalbumin w/creat. ratio [id number]) T- Hemoglobin A1C [id number])  Labs Reviewed: Creat: 0.80 (03/12/2010)    Reviewed HgBA1c results: 6.7 (03/12/2010)  6.8 (12/10/2009)  Problem # 5:  INSOMNIA (ICD-780.52) Assessment: Unchanged  Her updated medication list for this problem includes:    Restoril 30 Mg Caps (Temazepam) .Marland Kitchen... Take 1 capsule by mouth at bedtime  Discussed sleep hygiene.   Complete Medication List: 1)  Walker  2)  Metformin Hcl 1000 Mg Tabs (Metformin hcl) ....  Take one tab by mouth two times a day with food 3)  Oyster Shell/d 500-200 Mg-unit Tabs (Calcium-vitamin d) .... Take 1 tab by mouth once daily 4)  Norvasc 5 Mg Tabs (Amlodipine besylate) .... Take 1 tablet by mouth once a day 5)  Nexium 40 Mg Cpdr (Esomeprazole magnesium) .... Take 1 tablet by mouth once a day 6)  Enablex 15 Mg Xr24h-tab (Darifenacin hydrobromide) .... One tab by mouth qd 7)  Onetouch Ultra Test Strp (Glucose blood) .... Uad three times a day 8)  Lovastatin 10 Mg Tabs (Lovastatin) .... Take 1 tab by mouth at bedtime 9)  Restoril 30 Mg Caps (Temazepam) .... Take 1 capsule by mouth at bedtime 10)  Tessalon Perles 100 Mg Caps (Benzonatate) .... Take 1 capsule by mouth three times a day 11)  Hydroxyzine Hcl 25 Mg Tabs (Hydroxyzine hcl) .... Take 1 tab by mouth at bedtime 12)  Lantus Solostar 100 Unit/ml Soln (Insulin glargine) .Marland Kitchen.. 15 units two times a day at breakfast and bedtime  Other Orders: T-Basic Metabolic Panel [id number])  Patient Instructions:  1)  Please schedule a follow-up appointment in 3 months. 2)  It is important that you exercise regularly at least 30 minutes 5 times a week. If you develop chest pain, have severe difficulty breathing, or feel very tired , stop exercising immediately and seek medical attention. 3)  You need to lose weight. Consider a lower calorie diet and regular exercise.  4)  Check your blood sugars twice daily. If your readings are usually above 250 or below 70 you should contact our office. 5)  Before breakfast 80 to 120 6)  Bedtime , goal is 130 to 170 7)  your insulin will need to be changed because of ins purposes 8)  Labs today. 9)  Fasting  10)  BMP prior to visit, ICD-9: 11)  Hepatic Panel prior to visit, ICD-9:  in 3 months 12)  Lipid Panel prior to visit, ICD-9: 13)  HbgA1C prior to visit, ICD-9: 14)  new med hydroxyzine 25mg  one at night to help with sleep, continue all your old medications Prescriptions: HYDROXYZINE HCL 25  MG TABS (HYDROXYZINE HCL) Take 1 tab by mouth at bedtime  #30 x 3   Entered by:   Everitt Amber LPN   Authorized by:   [name] MD   Signed by:   Everitt Amber LPN on 28/41/3244   Method used:   Electronically to        [facility] (retail)       [address]       [place], [place]  [address]       Ph: [phone]       Fax: [phone]   RxID:   [id number]    Orders Added: 1)  Est. Patient Level IV [63016] 2)  T-Urine Microalbumin w/creat. ratio [[id number]] 3)  T-Basic Metabolic Panel [[id number]] 4)  T-Hepatic Function [[id number]] 5)  T-Lipid Profile [80061-22930] 6)  T- Hemoglobin A1C [83036-23375]

## 2010-08-12 ENCOUNTER — Telehealth: Payer: Self-pay | Admitting: Family Medicine

## 2010-08-13 ENCOUNTER — Telehealth: Payer: Self-pay | Admitting: Family Medicine

## 2010-08-13 ENCOUNTER — Encounter: Payer: Self-pay | Admitting: Family Medicine

## 2010-08-15 ENCOUNTER — Encounter: Payer: Self-pay | Admitting: Family Medicine

## 2010-08-15 ENCOUNTER — Ambulatory Visit (INDEPENDENT_AMBULATORY_CARE_PROVIDER_SITE_OTHER): Payer: Medicare HMO

## 2010-08-15 ENCOUNTER — Telehealth: Payer: Self-pay | Admitting: Family Medicine

## 2010-08-15 DIAGNOSIS — I10 Essential (primary) hypertension: Secondary | ICD-10-CM

## 2010-08-15 DIAGNOSIS — E119 Type 2 diabetes mellitus without complications: Secondary | ICD-10-CM

## 2010-08-15 DIAGNOSIS — E785 Hyperlipidemia, unspecified: Secondary | ICD-10-CM

## 2010-08-18 ENCOUNTER — Encounter: Payer: Self-pay | Admitting: Family Medicine

## 2010-08-20 NOTE — Progress Notes (Signed)
  Phone Note Call from Patient   Summary of Call: Hydroxyzine not covered, there are no alternatives that do not require PA. Try to PA or D/C? Initial call taken by: Everitt Amber LPN,  August 12, 2010 4:44 PM  Follow-up for Phone Call        d/c the med pls Follow-up by: [name] MD,  August 12, 2010 4:52 PM  Additional Follow-up for Phone Call Additional follow up Details #1::        fax letter to pharmacy Additional Follow-up by: Adella Hare LPN,  August 12, 2010 5:03 PM

## 2010-08-20 NOTE — Letter (Signed)
Summary: [facility]  [facility]   Imported By: Lind Guest 08/13/2010 15:56:24  _____________________________________________________________________  External Attachment:    Type:   Image     Comment:   External Document

## 2010-08-20 NOTE — Progress Notes (Signed)
  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 if not do you want to?   [name] [phone] Initial call taken by: Adella Hare LPN,  August 13, 2010 9:46 AM  Follow-up for Phone Call        her labs ordered on 1/31.are past due, need to be done so I candetermine what meds she needs Follow-up by: [name] MD,  August 13, 2010 5:21 PM  Additional Follow-up for Phone Call Additional follow up Details #1::        called patient, no answer Additional Follow-up by: Adella Hare LPN,  August 14, 2010 10:14 AM    Additional Follow-up for Phone Call Additional follow up Details #2::    patient aware Follow-up by: Adella Hare LPN,  August 15, 2010 8:21 AM

## 2010-08-20 NOTE — Assessment & Plan Note (Signed)
Summary: recert   Allergies: 1)  ! Pcn   Complete Medication List: 1)  Walker  2)  Metformin Hcl 1000 Mg Tabs (Metformin hcl) .... Take one tab by mouth two times a day with food 3)  Oyster Shell/d 500-200 Mg-unit Tabs (Calcium-vitamin d) .... Take 1 tab by mouth once daily 4)  Norvasc 5 Mg Tabs (Amlodipine besylate) .... Take 1 tablet by mouth once a day 5)  Nexium 40 Mg Cpdr (Esomeprazole magnesium) .... Take 1 tablet by mouth once a day 6)  Enablex 15 Mg Xr24h-tab (Darifenacin hydrobromide) .... One tab by mouth qd 7)  Onetouch Ultra Test Strp (Glucose blood) .... Uad three times a day 8)  Lovastatin 10 Mg Tabs (Lovastatin) .... Take 1 tab by mouth at bedtime 9)  Restoril 30 Mg Caps (Temazepam) .... Take 1 capsule by mouth at bedtime 10)  Tessalon Perles 100 Mg Caps (Benzonatate) .... Take 1 capsule by mouth three times a day 11)  Hydroxyzine Hcl 25 Mg Tabs (Hydroxyzine hcl) .... Take 1 tab by mouth at bedtime 12)  Lantus Solostar 100 Unit/ml Soln (Insulin glargine) .Marland Kitchen.. 15 units two times a day at breakfast and bedtime  Other Orders: Re-certification of Home Health [phone])   Orders Added: 1)  Re-certification of Home Health [G0179]

## 2010-08-21 LAB — CONVERTED CEMR LAB
BUN: 8 mg/dL (ref 6–23)
Bilirubin, Direct: 0.1 mg/dL (ref 0.0–0.3)
Chloride: 97 meq/L (ref 96–112)
Glucose, Bld: 163 mg/dL — ABNORMAL HIGH (ref 70–99)
Hgb A1c MFr Bld: 7.3 % — ABNORMAL HIGH (ref <5.7)
Indirect Bilirubin: 0.2 mg/dL (ref 0.0–0.9)
LDL Cholesterol: 65 mg/dL (ref 0–99)
Potassium: 4.7 meq/L (ref 3.5–5.3)
Total CHOL/HDL Ratio: 2.8
Total Protein: 6.7 g/dL (ref 6.0–8.3)
VLDL: 18 mg/dL (ref 0–40)

## 2010-08-26 NOTE — Miscellaneous (Signed)
Summary: Home Care Report  Home Care Report   Imported By: Lind Guest 08/18/2010 11:17:21  _____________________________________________________________________  External Attachment:    Type:   Image     Comment:   External Document

## 2010-08-26 NOTE — Progress Notes (Signed)
  Phone Note Call from Patient   Summary of Call: Nexium not covered. Change to omeprazole20/40 or lansoprazole Initial call taken by: Everitt Amber LPN,  August 15, 2010 3:42 PM  Follow-up for Phone Call        plsadvise pt andpharmacy of the change entered historically Follow-up by: [name] MD,  August 16, 2010 6:52 AM  Additional Follow-up for Phone Call Additional follow up Details #1::        pharmacy aware. d/c'd nexium  Additional Follow-up by: Everitt Amber LPN,  August 18, 2010 1:25 PM    New/Updated Medications: OMEPRAZOLE 20 MG CPDR (OMEPRAZOLE) Take 1 capsule by mouth once a day Prescriptions: OMEPRAZOLE 20 MG CPDR (OMEPRAZOLE) Take 1 capsule by mouth once a day  #30 x 3   Entered by:   Everitt Amber LPN   Authorized by:   [name] MD   Signed by:   Everitt Amber LPN on 91/47/8295   Method used:   Printed then faxed to ...       [facility] (retail)       [address]       [place], [place]  [address]       Ph: [phone]       Fax: [phone]   RxID:   [id number] OMEPRAZOLE 20 MG CPDR (OMEPRAZOLE) Take 1 capsule by mouth once a day  #30 x 3   Entered and Authorized by:   [name] MD   Signed by:   [name] MD on 08/16/2010   Method used:   Historical   RxID:   [id number]

## 2010-09-10 LAB — URINALYSIS, ROUTINE W REFLEX MICROSCOPIC
Bilirubin Urine: NEGATIVE
Specific Gravity, Urine: <1.005 — ABNORMAL LOW (ref 1.005–1.030)
Urobilinogen, UA: 0.2 mg/dL (ref 0.0–1.0)
pH: 5.5 (ref 5.0–8.0)

## 2010-09-10 LAB — DIFFERENTIAL
Basophils Absolute: 0.1 10*3/uL (ref 0.0–0.1)
Eosinophils Relative: 3 % (ref 0–5)
Lymphocytes Relative: 39 % (ref 12–46)
Lymphs Abs: 2.4 10*3/uL (ref 0.7–4.0)
Neutro Abs: 3.2 10*3/uL (ref 1.7–7.7)
Neutrophils Relative %: 52 % (ref 43–77)

## 2010-09-10 LAB — BASIC METABOLIC PANEL
Chloride: 105 mEq/L (ref 96–112)
Creatinine, Ser: 0.83 mg/dL (ref 0.4–1.2)
GFR calc Af Amer: >60 mL/min (ref >60)
GFR calc non Af Amer: >60 mL/min (ref >60)

## 2010-09-10 LAB — CBC
MCV: 85 fL (ref 78.0–100.0)
Platelets: 278 10*3/uL (ref 150–400)
RBC: 4.33 MIL/uL (ref 3.87–5.11)
RDW: 17.1 % — ABNORMAL HIGH (ref 11.5–15.5)
WBC: 6.1 10*3/uL (ref 4.0–10.5)

## 2010-09-10 LAB — URINE CULTURE: Colony Count: 10000

## 2010-09-10 LAB — GLUCOSE, CAPILLARY: Glucose-Capillary: 149 mg/dL — ABNORMAL HIGH (ref 70–99)

## 2010-09-29 ENCOUNTER — Other Ambulatory Visit: Payer: Self-pay | Admitting: Family Medicine

## 2010-09-29 ENCOUNTER — Telehealth: Payer: Self-pay | Admitting: Family Medicine

## 2010-09-30 LAB — DIFFERENTIAL
Basophils Absolute: 0 10*3/uL (ref 0.0–0.1)
Basophils Relative: 0 % (ref 0–1)
Basophils Relative: 1 % (ref 0–1)
Eosinophils Relative: 2 % (ref 0–5)
Eosinophils Relative: 3 % (ref 0–5)
Lymphocytes Relative: 27 % (ref 12–46)
Lymphs Abs: 1.2 10*3/uL (ref 0.7–4.0)
Monocytes Absolute: 0.4 10*3/uL (ref 0.1–1.0)
Monocytes Relative: 7 % (ref 3–12)
Monocytes Relative: 8 % (ref 3–12)
Neutro Abs: 2.5 10*3/uL (ref 1.7–7.7)
Neutro Abs: 4 10*3/uL (ref 1.7–7.7)
Neutrophils Relative %: 63 % (ref 43–77)
Neutrophils Relative %: 70 % (ref 43–77)

## 2010-09-30 LAB — BASIC METABOLIC PANEL
BUN: 10 mg/dL (ref 6–23)
BUN: 11 mg/dL (ref 6–23)
BUN: 9 mg/dL (ref 6–23)
CO2: 19 mEq/L (ref 19–32)
Calcium: 8.3 mg/dL — ABNORMAL LOW (ref 8.4–10.5)
Calcium: 8.4 mg/dL (ref 8.4–10.5)
Calcium: 8.5 mg/dL (ref 8.4–10.5)
GFR calc non Af Amer: 53 mL/min — ABNORMAL LOW (ref >60)
GFR calc non Af Amer: 55 mL/min — ABNORMAL LOW (ref >60)
GFR calc non Af Amer: >60 mL/min (ref >60)
Glucose, Bld: 389 mg/dL — ABNORMAL HIGH (ref 70–99)
Glucose, Bld: 564 mg/dL (ref 70–99)
Potassium: 3.7 mEq/L (ref 3.5–5.1)
Potassium: 3.8 mEq/L (ref 3.5–5.1)

## 2010-09-30 LAB — URINALYSIS, ROUTINE W REFLEX MICROSCOPIC
Bilirubin Urine: NEGATIVE
Glucose, UA: >1000 mg/dL — AB
Glucose, UA: >1000 mg/dL — AB
Glucose, UA: >1000 mg/dL — AB
Ketones, ur: 15 mg/dL — AB
Ketones, ur: NEGATIVE mg/dL
Leukocytes, UA: NEGATIVE
Leukocytes, UA: NEGATIVE
Nitrite: NEGATIVE
Nitrite: NEGATIVE
Protein, ur: NEGATIVE mg/dL
Specific Gravity, Urine: <1.005 — ABNORMAL LOW (ref 1.005–1.030)
Specific Gravity, Urine: <1.005 — ABNORMAL LOW (ref 1.005–1.030)
pH: 5 (ref 5.0–8.0)
pH: 5.5 (ref 5.0–8.0)

## 2010-09-30 LAB — CBC
HCT: 33 % — ABNORMAL LOW (ref 36.0–46.0)
HCT: 33 % — ABNORMAL LOW (ref 36.0–46.0)
HCT: 34.4 % — ABNORMAL LOW (ref 36.0–46.0)
Hemoglobin: 11.3 g/dL — ABNORMAL LOW (ref 12.0–15.0)
MCHC: 32.8 g/dL (ref 30.0–36.0)
Platelets: 267 10*3/uL (ref 150–400)
Platelets: 279 10*3/uL (ref 150–400)
RDW: 15.9 % — ABNORMAL HIGH (ref 11.5–15.5)
RDW: 16.1 % — ABNORMAL HIGH (ref 11.5–15.5)
WBC: 4.4 10*3/uL (ref 4.0–10.5)
WBC: 5.6 10*3/uL (ref 4.0–10.5)

## 2010-09-30 LAB — COMPREHENSIVE METABOLIC PANEL
Alkaline Phosphatase: 117 U/L (ref 39–117)
BUN: 14 mg/dL (ref 6–23)
Calcium: 9.3 mg/dL (ref 8.4–10.5)
Glucose, Bld: 612 mg/dL (ref 70–99)
Total Protein: 6.6 g/dL (ref 6.0–8.3)

## 2010-09-30 LAB — GLUCOSE, CAPILLARY
Glucose-Capillary: 246 mg/dL — ABNORMAL HIGH (ref 70–99)
Glucose-Capillary: 247 mg/dL — ABNORMAL HIGH (ref 70–99)
Glucose-Capillary: 339 mg/dL — ABNORMAL HIGH (ref 70–99)
Glucose-Capillary: 408 mg/dL — ABNORMAL HIGH (ref 70–99)
Glucose-Capillary: 546 mg/dL (ref 70–99)

## 2010-09-30 LAB — URINE MICROSCOPIC-ADD ON

## 2010-10-01 ENCOUNTER — Other Ambulatory Visit: Payer: Self-pay | Admitting: Family Medicine

## 2010-10-02 LAB — DIFFERENTIAL
Basophils Absolute: 0 10*3/uL (ref 0.0–0.1)
Basophils Absolute: 0 10*3/uL (ref 0.0–0.1)
Basophils Relative: 1 % (ref 0–1)
Eosinophils Absolute: 0.1 10*3/uL (ref 0.0–0.7)
Eosinophils Absolute: 0.2 10*3/uL (ref 0.0–0.7)
Eosinophils Absolute: 0.2 10*3/uL (ref 0.0–0.7)
Eosinophils Relative: 1 % (ref 0–5)
Eosinophils Relative: 4 % (ref 0–5)
Lymphs Abs: 0.8 10*3/uL (ref 0.7–4.0)
Lymphs Abs: 1.7 10*3/uL (ref 0.7–4.0)
Monocytes Absolute: 0.3 10*3/uL (ref 0.1–1.0)
Monocytes Relative: 5 % (ref 3–12)
Neutrophils Relative %: 54 % (ref 43–77)
Neutrophils Relative %: 65 % (ref 43–77)

## 2010-10-02 LAB — BASIC METABOLIC PANEL
BUN: 7 mg/dL (ref 6–23)
BUN: 9 mg/dL (ref 6–23)
CO2: 26 mEq/L (ref 19–32)
Chloride: 100 mEq/L (ref 96–112)
Creatinine, Ser: 0.77 mg/dL (ref 0.4–1.2)
Creatinine, Ser: 0.82 mg/dL (ref 0.4–1.2)
GFR calc non Af Amer: >60 mL/min (ref >60)
Glucose, Bld: 147 mg/dL — ABNORMAL HIGH (ref 70–99)

## 2010-10-02 LAB — URINALYSIS, ROUTINE W REFLEX MICROSCOPIC
Bilirubin Urine: NEGATIVE
Specific Gravity, Urine: <1.005 — ABNORMAL LOW (ref 1.005–1.030)
pH: 6 (ref 5.0–8.0)

## 2010-10-02 LAB — COMPREHENSIVE METABOLIC PANEL
ALT: 24 U/L (ref 0–35)
AST: 25 U/L (ref 0–37)
Albumin: 3.8 g/dL (ref 3.5–5.2)
CO2: 23 mEq/L (ref 19–32)
Calcium: 8.6 mg/dL (ref 8.4–10.5)
GFR calc Af Amer: >60 mL/min (ref >60)
GFR calc non Af Amer: >60 mL/min (ref >60)
Sodium: 127 mEq/L — ABNORMAL LOW (ref 135–145)

## 2010-10-02 LAB — WOUND CULTURE
Culture: NO GROWTH
Gram Stain: NONE SEEN

## 2010-10-02 LAB — POCT I-STAT, CHEM 8
BUN: 10 mg/dL (ref 6–23)
Calcium, Ion: 1.03 mmol/L — ABNORMAL LOW (ref 1.12–1.32)
Chloride: 97 mEq/L (ref 96–112)
Creatinine, Ser: 0.7 mg/dL (ref 0.4–1.2)
Glucose, Bld: 250 mg/dL — ABNORMAL HIGH (ref 70–99)

## 2010-10-02 LAB — URINE MICROSCOPIC-ADD ON

## 2010-10-02 LAB — CBC
HCT: 31 % — ABNORMAL LOW (ref 36.0–46.0)
MCHC: 33.6 g/dL (ref 30.0–36.0)
MCHC: 33.7 g/dL (ref 30.0–36.0)
MCV: 88.5 fL (ref 78.0–100.0)
MCV: 88.7 fL (ref 78.0–100.0)
Platelets: 279 10*3/uL (ref 150–400)
Platelets: 302 10*3/uL (ref 150–400)
RBC: 3.66 MIL/uL — ABNORMAL LOW (ref 3.87–5.11)
RDW: 17.4 % — ABNORMAL HIGH (ref 11.5–15.5)
RDW: 17.6 % — ABNORMAL HIGH (ref 11.5–15.5)
WBC: 5.1 10*3/uL (ref 4.0–10.5)
WBC: 5.9 10*3/uL (ref 4.0–10.5)
WBC: 6.1 10*3/uL (ref 4.0–10.5)

## 2010-10-02 LAB — GLUCOSE, CAPILLARY
Glucose-Capillary: 115 mg/dL — ABNORMAL HIGH (ref 70–99)
Glucose-Capillary: 146 mg/dL — ABNORMAL HIGH (ref 70–99)
Glucose-Capillary: 152 mg/dL — ABNORMAL HIGH (ref 70–99)
Glucose-Capillary: 159 mg/dL — ABNORMAL HIGH (ref 70–99)
Glucose-Capillary: 162 mg/dL — ABNORMAL HIGH (ref 70–99)
Glucose-Capillary: 278 mg/dL — ABNORMAL HIGH (ref 70–99)

## 2010-10-02 LAB — BRAIN NATRIURETIC PEPTIDE: Pro B Natriuretic peptide (BNP): <30 pg/mL (ref 0.0–100.0)

## 2010-10-02 LAB — OSMOLALITY: Osmolality: 271 mOsm/kg — ABNORMAL LOW (ref 275–300)

## 2010-10-02 LAB — POCT CARDIAC MARKERS
CKMB, poc: <1 ng/mL — ABNORMAL LOW (ref 1.0–8.0)
Troponin i, poc: <0.05 ng/mL (ref 0.00–0.09)

## 2010-10-02 LAB — HEMOGLOBIN A1C
Hgb A1c MFr Bld: 6.7 % — ABNORMAL HIGH (ref 4.6–6.1)
Mean Plasma Glucose: 146 mg/dL

## 2010-10-02 LAB — URINE CULTURE

## 2010-10-03 LAB — GLUCOSE, CAPILLARY
Glucose-Capillary: 103 mg/dL — ABNORMAL HIGH (ref 70–99)
Glucose-Capillary: 104 mg/dL — ABNORMAL HIGH (ref 70–99)
Glucose-Capillary: 105 mg/dL — ABNORMAL HIGH (ref 70–99)
Glucose-Capillary: 111 mg/dL — ABNORMAL HIGH (ref 70–99)
Glucose-Capillary: 115 mg/dL — ABNORMAL HIGH (ref 70–99)
Glucose-Capillary: 127 mg/dL — ABNORMAL HIGH (ref 70–99)
Glucose-Capillary: 139 mg/dL — ABNORMAL HIGH (ref 70–99)
Glucose-Capillary: 152 mg/dL — ABNORMAL HIGH (ref 70–99)
Glucose-Capillary: 156 mg/dL — ABNORMAL HIGH (ref 70–99)
Glucose-Capillary: 159 mg/dL — ABNORMAL HIGH (ref 70–99)
Glucose-Capillary: 196 mg/dL — ABNORMAL HIGH (ref 70–99)
Glucose-Capillary: 61 mg/dL — ABNORMAL LOW (ref 70–99)
Glucose-Capillary: 62 mg/dL — ABNORMAL LOW (ref 70–99)
Glucose-Capillary: 89 mg/dL (ref 70–99)
Glucose-Capillary: 94 mg/dL (ref 70–99)

## 2010-10-03 LAB — DIFFERENTIAL
Basophils Absolute: 0 10*3/uL (ref 0.0–0.1)
Basophils Relative: 0 % (ref 0–1)
Eosinophils Relative: 0 % (ref 0–5)
Lymphocytes Relative: 11 % — ABNORMAL LOW (ref 12–46)
Lymphs Abs: 1.2 10*3/uL (ref 0.7–4.0)
Monocytes Absolute: 0.5 10*3/uL (ref 0.1–1.0)
Monocytes Relative: 6 % (ref 3–12)
Monocytes Relative: 7 % (ref 3–12)
Monocytes Relative: 9 % (ref 3–12)
Neutro Abs: 3.2 10*3/uL (ref 1.7–7.7)
Neutro Abs: 4.7 10*3/uL (ref 1.7–7.7)
Neutrophils Relative %: 64 % (ref 43–77)
Neutrophils Relative %: 67 % (ref 43–77)

## 2010-10-03 LAB — CBC
Hemoglobin: 11.8 g/dL — ABNORMAL LOW (ref 12.0–15.0)
MCHC: 34.2 g/dL (ref 30.0–36.0)
MCHC: 34.4 g/dL (ref 30.0–36.0)
MCV: 86.9 fL (ref 78.0–100.0)
Platelets: 236 10*3/uL (ref 150–400)
RBC: 3.62 MIL/uL — ABNORMAL LOW (ref 3.87–5.11)
RBC: 3.65 MIL/uL — ABNORMAL LOW (ref 3.87–5.11)
RDW: 16.4 % — ABNORMAL HIGH (ref 11.5–15.5)
WBC: 5 10*3/uL (ref 4.0–10.5)
WBC: 7.1 10*3/uL (ref 4.0–10.5)

## 2010-10-03 LAB — POCT I-STAT, CHEM 8
BUN: 13 mg/dL (ref 6–23)
Calcium, Ion: 1.04 mmol/L — ABNORMAL LOW (ref 1.12–1.32)
Chloride: 97 mEq/L (ref 96–112)
Creatinine, Ser: 0.8 mg/dL (ref 0.4–1.2)
Glucose, Bld: 163 mg/dL — ABNORMAL HIGH (ref 70–99)
TCO2: 21 mmol/L (ref 0–100)

## 2010-10-03 LAB — URINALYSIS, ROUTINE W REFLEX MICROSCOPIC
Bilirubin Urine: NEGATIVE
Bilirubin Urine: NEGATIVE
Hgb urine dipstick: NEGATIVE
Ketones, ur: NEGATIVE mg/dL
Nitrite: NEGATIVE
Protein, ur: NEGATIVE mg/dL
Protein, ur: NEGATIVE mg/dL
Specific Gravity, Urine: <1.005 — ABNORMAL LOW (ref 1.005–1.030)
Urobilinogen, UA: 0.2 mg/dL (ref 0.0–1.0)
Urobilinogen, UA: 0.2 mg/dL (ref 0.0–1.0)

## 2010-10-03 LAB — CARDIAC PANEL(CRET KIN+CKTOT+MB+TROPI)
CK, MB: 10.9 ng/mL — ABNORMAL HIGH (ref 0.3–4.0)
CK, MB: 8.7 ng/mL — ABNORMAL HIGH (ref 0.3–4.0)
CK, MB: 9.1 ng/mL — ABNORMAL HIGH (ref 0.3–4.0)
Relative Index: 1.6 (ref 0.0–2.5)
Relative Index: 1.7 (ref 0.0–2.5)
Relative Index: 1.7 (ref 0.0–2.5)
Total CK: 689 U/L — ABNORMAL HIGH (ref 7–177)
Troponin I: 0.02 ng/mL (ref 0.00–0.06)
Troponin I: 0.03 ng/mL (ref 0.00–0.06)

## 2010-10-03 LAB — BASIC METABOLIC PANEL
BUN: 8 mg/dL (ref 6–23)
CO2: 23 mEq/L (ref 19–32)
CO2: 23 mEq/L (ref 19–32)
Calcium: 8.1 mg/dL — ABNORMAL LOW (ref 8.4–10.5)
Calcium: 8.5 mg/dL (ref 8.4–10.5)
Calcium: 8.9 mg/dL (ref 8.4–10.5)
Calcium: 9 mg/dL (ref 8.4–10.5)
Chloride: 83 mEq/L — ABNORMAL LOW (ref 96–112)
Creatinine, Ser: 0.76 mg/dL (ref 0.4–1.2)
Creatinine, Ser: 0.8 mg/dL (ref 0.4–1.2)
Creatinine, Ser: 0.93 mg/dL (ref 0.4–1.2)
GFR calc Af Amer: >60 mL/min (ref >60)
GFR calc Af Amer: >60 mL/min (ref >60)
GFR calc non Af Amer: >60 mL/min (ref >60)
GFR calc non Af Amer: >60 mL/min (ref >60)
Glucose, Bld: 102 mg/dL — ABNORMAL HIGH (ref 70–99)
Glucose, Bld: 146 mg/dL — ABNORMAL HIGH (ref 70–99)
Potassium: 3.8 mEq/L (ref 3.5–5.1)

## 2010-10-03 LAB — SODIUM
Sodium: 115 mEq/L — CL (ref 135–145)
Sodium: 115 mEq/L — CL (ref 135–145)
Sodium: 120 mEq/L — ABNORMAL LOW (ref 135–145)
Sodium: 126 mEq/L — ABNORMAL LOW (ref 135–145)

## 2010-10-03 LAB — CK TOTAL AND CKMB (NOT AT ARMC)
CK, MB: 9.4 ng/mL — ABNORMAL HIGH (ref 0.3–4.0)
Total CK: 679 U/L — ABNORMAL HIGH (ref 7–177)

## 2010-10-05 LAB — URINALYSIS, ROUTINE W REFLEX MICROSCOPIC
Glucose, UA: 500 mg/dL — AB
Leukocytes, UA: NEGATIVE
Protein, ur: NEGATIVE mg/dL
pH: 6 (ref 5.0–8.0)

## 2010-10-05 LAB — GLUCOSE, CAPILLARY: Glucose-Capillary: 191 mg/dL — ABNORMAL HIGH (ref 70–99)

## 2010-10-08 LAB — URINALYSIS, ROUTINE W REFLEX MICROSCOPIC
Bilirubin Urine: NEGATIVE
Bilirubin Urine: NEGATIVE
Glucose, UA: >1000 mg/dL — AB
Glucose, UA: >1000 mg/dL — AB
Ketones, ur: NEGATIVE mg/dL
Ketones, ur: NEGATIVE mg/dL
Leukocytes, UA: NEGATIVE
Leukocytes, UA: NEGATIVE
Nitrite: NEGATIVE
Nitrite: NEGATIVE
Nitrite: NEGATIVE
Protein, ur: NEGATIVE mg/dL
Protein, ur: NEGATIVE mg/dL
Specific Gravity, Urine: <1.005 — ABNORMAL LOW (ref 1.005–1.030)
Urobilinogen, UA: 0.2 mg/dL (ref 0.0–1.0)
Urobilinogen, UA: 0.2 mg/dL (ref 0.0–1.0)
pH: 5.5 (ref 5.0–8.0)
pH: 6 (ref 5.0–8.0)
pH: 6 (ref 5.0–8.0)

## 2010-10-08 LAB — GLUCOSE, CAPILLARY
Glucose-Capillary: 106 mg/dL — ABNORMAL HIGH (ref 70–99)
Glucose-Capillary: 119 mg/dL — ABNORMAL HIGH (ref 70–99)
Glucose-Capillary: 169 mg/dL — ABNORMAL HIGH (ref 70–99)
Glucose-Capillary: 178 mg/dL — ABNORMAL HIGH (ref 70–99)
Glucose-Capillary: 193 mg/dL — ABNORMAL HIGH (ref 70–99)
Glucose-Capillary: 223 mg/dL — ABNORMAL HIGH (ref 70–99)
Glucose-Capillary: 232 mg/dL — ABNORMAL HIGH (ref 70–99)
Glucose-Capillary: 239 mg/dL — ABNORMAL HIGH (ref 70–99)

## 2010-10-08 LAB — BASIC METABOLIC PANEL
BUN: 10 mg/dL (ref 6–23)
BUN: 11 mg/dL (ref 6–23)
BUN: 14 mg/dL (ref 6–23)
BUN: 6 mg/dL (ref 6–23)
CO2: 22 mEq/L (ref 19–32)
CO2: 24 mEq/L (ref 19–32)
CO2: 25 mEq/L (ref 19–32)
CO2: 26 mEq/L (ref 19–32)
CO2: 27 mEq/L (ref 19–32)
Calcium: 8.6 mg/dL (ref 8.4–10.5)
Calcium: 8.7 mg/dL (ref 8.4–10.5)
Chloride: 100 mEq/L (ref 96–112)
Chloride: 101 mEq/L (ref 96–112)
Chloride: 103 mEq/L (ref 96–112)
Chloride: 98 mEq/L (ref 96–112)
Creatinine, Ser: 0.89 mg/dL (ref 0.4–1.2)
Creatinine, Ser: 1.04 mg/dL (ref 0.4–1.2)
GFR calc Af Amer: >60 mL/min (ref >60)
GFR calc Af Amer: >60 mL/min (ref >60)
GFR calc non Af Amer: 46 mL/min — ABNORMAL LOW (ref >60)
GFR calc non Af Amer: 52 mL/min — ABNORMAL LOW (ref >60)
GFR calc non Af Amer: 55 mL/min — ABNORMAL LOW (ref >60)
Glucose, Bld: 144 mg/dL — ABNORMAL HIGH (ref 70–99)
Glucose, Bld: 216 mg/dL — ABNORMAL HIGH (ref 70–99)
Glucose, Bld: 443 mg/dL — ABNORMAL HIGH (ref 70–99)
Glucose, Bld: 494 mg/dL — ABNORMAL HIGH (ref 70–99)
Potassium: 3.5 mEq/L (ref 3.5–5.1)
Potassium: 3.9 mEq/L (ref 3.5–5.1)
Potassium: 3.9 mEq/L (ref 3.5–5.1)
Potassium: 4.3 mEq/L (ref 3.5–5.1)
Sodium: 131 mEq/L — ABNORMAL LOW (ref 135–145)
Sodium: 134 mEq/L — ABNORMAL LOW (ref 135–145)
Sodium: 134 mEq/L — ABNORMAL LOW (ref 135–145)

## 2010-10-08 LAB — URINE MICROSCOPIC-ADD ON

## 2010-10-08 LAB — DIFFERENTIAL
Basophils Absolute: 0 10*3/uL (ref 0.0–0.1)
Basophils Absolute: 0 10*3/uL (ref 0.0–0.1)
Basophils Absolute: 0 10*3/uL (ref 0.0–0.1)
Basophils Relative: 1 % (ref 0–1)
Basophils Relative: 1 % (ref 0–1)
Eosinophils Absolute: 0.1 10*3/uL (ref 0.0–0.7)
Eosinophils Absolute: 0.1 10*3/uL (ref 0.0–0.7)
Eosinophils Absolute: 0.2 10*3/uL (ref 0.0–0.7)
Eosinophils Relative: 1 % (ref 0–5)
Eosinophils Relative: 2 % (ref 0–5)
Eosinophils Relative: 3 % (ref 0–5)
Lymphocytes Relative: 24 % (ref 12–46)
Lymphocytes Relative: 26 % (ref 12–46)
Lymphocytes Relative: 29 % (ref 12–46)
Lymphocytes Relative: 29 % (ref 12–46)
Lymphs Abs: 1.6 10*3/uL (ref 0.7–4.0)
Monocytes Absolute: 0.3 10*3/uL (ref 0.1–1.0)
Monocytes Absolute: 0.5 10*3/uL (ref 0.1–1.0)
Monocytes Absolute: 0.6 10*3/uL (ref 0.1–1.0)
Monocytes Relative: 8 % (ref 3–12)
Neutro Abs: 3.4 10*3/uL (ref 1.7–7.7)
Neutrophils Relative %: 64 % (ref 43–77)

## 2010-10-08 LAB — HEMOGLOBIN A1C: Hgb A1c MFr Bld: 9.8 % — ABNORMAL HIGH (ref 4.6–6.1)

## 2010-10-08 LAB — CBC
HCT: 35.2 % — ABNORMAL LOW (ref 36.0–46.0)
HCT: 37 % (ref 36.0–46.0)
HCT: 38.3 % (ref 36.0–46.0)
Hemoglobin: 11.9 g/dL — ABNORMAL LOW (ref 12.0–15.0)
Hemoglobin: 13 g/dL (ref 12.0–15.0)
MCHC: 34 g/dL (ref 30.0–36.0)
MCHC: 34.3 g/dL (ref 30.0–36.0)
MCV: 89.6 fL (ref 78.0–100.0)
MCV: 89.9 fL (ref 78.0–100.0)
MCV: 90.5 fL (ref 78.0–100.0)
Platelets: 189 10*3/uL (ref 150–400)
Platelets: 204 10*3/uL (ref 150–400)
Platelets: 235 10*3/uL (ref 150–400)
Platelets: 243 10*3/uL (ref 150–400)
RDW: 15.3 % (ref 11.5–15.5)
RDW: 15.5 % (ref 11.5–15.5)
RDW: 15.6 % — ABNORMAL HIGH (ref 11.5–15.5)
WBC: 5.7 10*3/uL (ref 4.0–10.5)

## 2010-10-08 LAB — POCT CARDIAC MARKERS
CKMB, poc: <1 ng/mL — ABNORMAL LOW (ref 1.0–8.0)
Myoglobin, poc: 52.4 ng/mL (ref 12–200)
Myoglobin, poc: 57.8 ng/mL (ref 12–200)
Troponin i, poc: <0.05 ng/mL (ref 0.00–0.09)

## 2010-10-08 LAB — KETONES, QUALITATIVE: Acetone, Bld: NEGATIVE

## 2010-10-11 ENCOUNTER — Other Ambulatory Visit: Payer: Self-pay | Admitting: Family Medicine

## 2010-10-13 LAB — URINALYSIS, ROUTINE W REFLEX MICROSCOPIC
Bilirubin Urine: NEGATIVE
Ketones, ur: NEGATIVE mg/dL
Specific Gravity, Urine: 1.015 (ref 1.005–1.030)
Urobilinogen, UA: 0.2 mg/dL (ref 0.0–1.0)

## 2010-10-13 LAB — URINE MICROSCOPIC-ADD ON

## 2010-10-21 ENCOUNTER — Other Ambulatory Visit: Payer: Self-pay | Admitting: *Deleted

## 2010-10-21 ENCOUNTER — Other Ambulatory Visit: Payer: Self-pay | Admitting: Family Medicine

## 2010-10-22 ENCOUNTER — Encounter: Payer: Self-pay | Admitting: Family Medicine

## 2010-10-23 ENCOUNTER — Encounter: Payer: Self-pay | Admitting: Family Medicine

## 2010-10-24 ENCOUNTER — Other Ambulatory Visit: Payer: Self-pay | Admitting: Family Medicine

## 2010-10-27 ENCOUNTER — Ambulatory Visit (INDEPENDENT_AMBULATORY_CARE_PROVIDER_SITE_OTHER): Payer: Medicare HMO | Admitting: Family Medicine

## 2010-10-27 ENCOUNTER — Encounter: Payer: Self-pay | Admitting: Family Medicine

## 2010-10-27 VITALS — BP 158/82 | HR 106 | Resp 16 | Wt 201.4 lb

## 2010-10-27 DIAGNOSIS — I10 Essential (primary) hypertension: Secondary | ICD-10-CM

## 2010-10-27 DIAGNOSIS — J301 Allergic rhinitis due to pollen: Secondary | ICD-10-CM

## 2010-10-27 DIAGNOSIS — E119 Type 2 diabetes mellitus without complications: Secondary | ICD-10-CM

## 2010-10-27 DIAGNOSIS — E785 Hyperlipidemia, unspecified: Secondary | ICD-10-CM

## 2010-10-27 MED ORDER — BENZONATATE 100 MG PO CAPS: 100.0000 mg | ORAL_CAPSULE | Freq: Three times a day (TID) | ORAL | Status: DC | PRN

## 2010-10-27 MED ORDER — LOVASTATIN 10 MG PO TABS: 10.0000 mg | ORAL_TABLET | Freq: Every day | ORAL | Status: DC

## 2010-10-27 MED ORDER — BENAZEPRIL HCL 10 MG PO TABS: 10.0000 mg | ORAL_TABLET | Freq: Every day | ORAL | Status: DC

## 2010-10-27 MED ORDER — AMLODIPINE BESYLATE 5 MG PO TABS: 5.0000 mg | ORAL_TABLET | Freq: Every day | ORAL | Status: DC

## 2010-10-27 MED ORDER — DARIFENACIN HYDROBROMIDE ER 15 MG PO TB24: 15.0000 mg | ORAL_TABLET | Freq: Every day | ORAL | Status: DC

## 2010-10-27 MED ORDER — TEMAZEPAM 30 MG PO CAPS: 30.0000 mg | ORAL_CAPSULE | Freq: Every evening | ORAL | Status: DC | PRN

## 2010-10-27 MED ORDER — METFORMIN HCL 1000 MG PO TABS: 1000.0000 mg | ORAL_TABLET | Freq: Two times a day (BID) | ORAL | Status: DC

## 2010-10-27 MED ORDER — FLUTICASONE PROPIONATE 50 MCG/ACT NA SUSP: 1.0000 | Freq: Every day | NASAL | Status: DC

## 2010-10-27 NOTE — Patient Instructions (Signed)
F/u in 6 weeks.  Labs non fasting in 6 weeks  Blood pressure is high , additional med will be added

## 2010-10-27 NOTE — Progress Notes (Signed)
  Subjective:    Patient ID: [name], female    DOB: May 11, 1932, 77 y.o.   MRN: [id number]  HPI HYPERTENSION Disease Monitoring Blood pressure range-unknown Chest pain- no      Dyspnea- no Medications Compliance- poor Lightheadedness- no   Edema- no   DIABETES Disease Monitoring Blood Sugar ranges-108 to 160'6 fasting and bedtimes120 to 221 bedtime Polyuria- no New Visual problems- no Medications Compliance- poor dietary compliance, takes med Hypoglycemic symptoms- no Eye exam past due   HYPERLIPIDEMIA Disease Monitoring See symptoms for Hypertension Medications Compliance- goodRUQ pain- no  Muscle aches- no    Review of Systems Denies recent fever or chills. Denies sinus pressure, nasal congestion, ear pain or sore throat. Denies chest congestion, productive cough or wheezing. Denies chest pains, palpitations, paroxysmal nocturnal dyspnea, orthopnea and leg swelling Denies abdominal pain, nausea, vomiting,diarrhea or constipation.  Denies rectal bleeding or change in bowel movement. Denies dysuria, frequency, hesitancy or incontinence. Denies joint pain, swelling and limitation in mobility. Denies headaches, seizure, numbness, or tingling. Denies uncontrolled  depression or  anxiety chronic c/oinsomnia. Denies skin break down or rash.        Objective:   Physical Exam Patient alert and oriented and in no Cardiopulmonary distress.  HEENT: No facial asymmetry, EOMI, no sinus tenderness, TM's clear, Oropharynx pink and moist.  Neck supple no adenopathy.  Chest: Clear to auscultation bilaterally.  CVS: S1, S2 no murmurs, no S3.  ABD: Soft non tender. Bowel sounds normal.  Ext: No edema  MS: Adequate ROM spine, shoulders, hips and knees.  Skin: Intact, no ulcerations or rash noted.  Psych: Good eye contact, flat affect. Memory loss,not anxious or depressed appearing.  CNS: CN 2-12 intact, power, tone and sensation normal throughout. Diabetic Foot Check:   Appearance - no lesions, ulcers or calluses Skin - no unusual pallor or redness Sensation - grossly intact to light touch Monofilament testing -  Right - Great toe, medial, central, lateral ball and posterior foot diminished Left - Great toe, medial, central, lateral ball and posterior foot diminished Pulses Left - Dorsalis Pedis and Posterior Tibia normal Right - Dorsalis Pedis and Posterior Tibia normal         Assessment & Plan:

## 2010-10-27 NOTE — Telephone Encounter (Deleted)
Error there was no message generated

## 2010-10-27 NOTE — Assessment & Plan Note (Signed)
Deteriorated, new meds added

## 2010-10-27 NOTE — Assessment & Plan Note (Signed)
Medication compliance addressed. Commitment to regular exercise and healthy  food choices, with portion control discussed. DASH diet and low fat diet discussed and literature offered. Changes in medication made at this visit.  

## 2010-10-29 NOTE — Telephone Encounter (Signed)
Error with the pool

## 2010-11-02 ENCOUNTER — Encounter: Payer: Self-pay | Admitting: Family Medicine

## 2010-11-02 NOTE — Assessment & Plan Note (Signed)
Deteriorated, compliance with diet and med stressed

## 2010-11-02 NOTE — Assessment & Plan Note (Signed)
Controlled, no change in medication  

## 2010-11-06 ENCOUNTER — Telehealth: Payer: Self-pay | Admitting: Family Medicine

## 2010-11-06 NOTE — Telephone Encounter (Signed)
noted 

## 2010-11-07 ENCOUNTER — Other Ambulatory Visit: Payer: Self-pay | Admitting: Family Medicine

## 2010-11-11 NOTE — H&P (Signed)
[name], [name]                  ACCOUNT NO.:  [id number]   MEDICAL RECORD NO.:  [id number]          PATIENT TYPE:  AMB   LOCATION:  DAY                           FACILITY:  [facility]   PHYSICIAN:  Tilford Pillar, MD      DATE OF BIRTH:  1931-10-08   DATE OF ADMISSION:  DATE OF DISCHARGE:  LH                              HISTORY & PHYSICAL   CHIEF COMPLAINTS:  Weight loss.   HISTORY OF PRESENT ILLNESS:  The patient is a 77 year old African-  American female who is a fairly poor historian and is actually not quite  sure of why she is presenting in general surgeon's office for  evaluation.  At this time, I did call Dr. [name] in the office  and talked to Dr. Lodema Hong in regards to her reason for referral.  Apparently, the patient has had unexplained weight loss over the last  several months and is coming due for a colonoscopy with her last  colonoscopy being in 2001.  In returning to discuss this with the  patient, she has denied any abdominal symptomatology.  She does admit to  having some weight loss that is unintentional, but attributed to her  diabetes mellitus.  She denies any bowel changes.  She denies any melena  or hematochezia.  She has had no history of diarrhea or constipation.  She has had no fever or chills.  She has had no change in urination.  She is not sure if she has had a colonoscopy in the interim since 2001  and she does not remember any findings on the last colonoscopy.  Again,  I have discussed with Dr. Lodema Hong and it appears that she had had some  diverticular disease at that time with diverticulosis, but no evidence  of any polyps or masses were noted at that time.   PAST MEDICAL HISTORY:  1. Diabetes mellitus type 2.  2. Hypertension.  3. Hyperlipidemia.  4. Depression.  5. Insomnia.  6. Degenerative joint disease.  7. Chronic neck and back pain.   PAST SURGICAL HISTORY:  She has had umbilical hernia repair.   MEDICATIONS:  Reviewed with the  patient.  She is currently on:  1. Glimepiride 4 mg 1 b.i.d.  2. Omeprazole 20 mg 2 p.o. daily.  3. Fluoxetine 20 mg 2 p.o. q.a.m.  4. Meloxicam 15 mg p.o. daily.  5. Lisinopril 20 mg p.o. daily.  She denies being on any other medications.   ALLERGIES:  She states she is allergic to PENICILLIN which produces a  rash and swelling.   SOCIAL HISTORY:  She is a tobacco chewer and does dip.  She denies any  tobacco smoking.  She denies any alcohol.  No recreational drug use.   REVIEW OF SYSTEMS:  GENERAL:  Relatively unremarkable.  CONSTITUTIONAL:  Unremarkable except for above-mentioned weight loss.  EYES:  Unremarkable.  EARS, NOSE AND THROAT:  Unremarkable.  RESPIRATORY:  She  denies any shortness of breath or wheezing.  CARDIOVASCULAR:  She denies  any chest pain or claudication.  GASTROINTESTINAL:  Unremarkable.  MUSCULOSKELETAL:  Unremarkable.  SKIN:  Unremarkable.  ENDOCRINE:  Unremarkable.  NEURO:  Unremarkable.   PHYSICAL EXAMINATION:  GENERAL:  The patient is an elderly-appearing  female, moderately obese with somewhat disheveled appearance.  She is  alert and oriented and does not appear to be in any acute distress.  HEENT:  Scalp, no deformities, no masses.  Eyes:  Pupils equal, round,  reactive.  Extraocular movements intact.  No conjunctival pallor is  noted.  No diminished hearing on evaluation today.  Oral mucosa is pink.  NECK:  Trachea is midline.  No cervical lymphadenopathy is appreciated.  PULMONARY:  Unlabored respirations.  She is clear to auscultation  bilaterally.  CARDIOVASCULAR:  Regular rate and rhythm, 2+ radial pulses.  ABDOMEN:  Positive bowel sounds.  Abdomen soft and nontender.  No  hernias and no masses appreciated.  SKIN:  Warm and dry.   PERTINENT LABORATORY STUDIES:  She has had a recent UA, liver panel, and  basic metabolic panel, which were all within normal limits.   ASSESSMENT AND PLAN:  Weight loss.  At this point, I would agree that   though I have a low suspicion of any cancer symptomatology other than  the weight loss, I would agree that proceeding with a colonoscopy would  be a possible course, and this was discussed at length with the patient.  I did discuss proceeding with a planned colonoscopy with my partner, Dr.  Franky Macho, who would perform the colonoscopy, but at this point, we  would get her set up for a screening colonoscopy.  The risks, benefits,  and alternatives were discussed with the patient.  It was also discussed  with the patient that she will be further evaluated and these risks and  benefits will be additionally explained to her prior to proceeding by my  partner.  At this point, we will plan to proceed with scheduling the  patient for her colonoscopy.      Tilford Pillar, MD  Electronically Signed     BZ/MEDQ  D:  11/01/2007  T:  11/02/2007  Job:  [id number]   cc:   Milus Mallick. Lodema Hong, M.D.  Fax: [phone]   [facility]  Fax: [phone]

## 2010-11-11 NOTE — Op Note (Signed)
[name], METH                  ACCOUNT NO.:  [id number]   MEDICAL RECORD NO.:  [id number]          PATIENT TYPE:  INP   LOCATION:  A339                          FACILITY:  [facility]   PHYSICIAN:  Vickki Hearing, M.D.DATE OF BIRTH:  [birth year]   DATE OF PROCEDURE:  12/09/2007  DATE OF DISCHARGE:                               OPERATIVE REPORT   PREOPERATIVE DIAGNOSIS:  Fracture, right olecranon with hardware failure  posttraumatic.   POSTOPERATIVE DIAGNOSIS:  Fracture, right olecranon with hardware  failure posttraumatic.   SECONDARY DIAGNOSIS:  Sinus tract.   PROCEDURES:  Open reduction triceps advancement, irrigation debridement,  and excision of sinus tract, right elbow.   SURGEON:  Vickki Hearing, MD   ASSISTANT:  Margaree Mackintosh   ANESTHESIA:  General.   OPERATIVE FINDINGS:  The hardware was loose.  One of the pins had come  out.  There was a sinus tract connecting the skin to the bone.  The  olecranon was reduced, comminuted, and osteoporotic.  There were no  specimens.  Minimal blood loss.   COMPLICATIONS:  None.   COUNTS:  Counts were correct.   HISTORY:  [name] is 77 year old female who fractured her right  olecranon and had open treatment internal fixation.  Approximately a  week after internal fixation, she became hypoglycemic, fell on her right  elbow, presented to the office with palpable defect and radiographs  showing failure of fixation.  At that point, she was scheduled for  surgery; however, she had to be evaluated by the medical service for  hypoglycemia.  Once that was done, she was admitted for nausea,  vomiting, and had EGD, which showed esophageal erosion.  She has been in  the hospital for several days for that and finally cleared for surgery  yesterday, brought to surgery today.   Her right elbow was marked for surgery, countersigned by the surgeon.  She was given Ancef 1 g IV prior to surgery, taken to the operating room  for general  anesthetic.  After sterile prep and drape with Betadine  scrub and paint, tourniquet was elevated to 250 mmHg and the time-out  procedure was completed.  Everyone agreed.  [name] was having  right elbow surgery.  All instruments were in the room.  Radiographs  present and antibiotic started.   Previous incision was used and taken down to the underlying fascia.  Medial and lateral skin flaps were created.  Fracture was explored,  irrigated including the elbow joint with copious amounts of saline.  Debridement was performed with several pieces of fibrous tissue removed.  Attempted open reduction was unsuccessful and therefore #5 suture was  weaved through the triceps and passed through drill holes in the distal  fragment or ulna, and tied with the elbow in extension.  Radiographs  were taken with the C-arm and were not clear, so a lateral view with the  elbow extended showed the fracture fragment in better position than with  the elbow flexed.  The patient was splinted at the end of the procedure  in extension.  The wounds were closed with 2-0 Monocryl and staples, injected 30 mL of  Marcaine with epinephrine.  The sinus tract was excised prior to  closure.  The patient was placed in a splint with 30 degrees flexion and  extubated, taken to recovery after tourniquet release in stable  condition.   POSTOP PLAN:  The patient is to stay in extension splint.  She will then  get a brace to limit flexion to 30 degrees with full extension  allowable.      Vickki Hearing, M.D.  Electronically Signed     SEH/MEDQ  D:  12/09/2007  T:  12/10/2007  Job:  [id number]

## 2010-11-11 NOTE — Consult Note (Signed)
NAME:  [name], [name]                  ACCOUNT NO.:  [id number]   MEDICAL RECORD NO.:  [id number]          PATIENT TYPE:  INP   LOCATION:  A302                          FACILITY:  [facility]   PHYSICIAN:  Kassie Mends, M.D.      DATE OF BIRTH:  1931/11/06   DATE OF CONSULTATION:  02/16/2008  DATE OF DISCHARGE:                                 CONSULTATION   REASON FOR CONSULTATION:  History of ulcerative esophagitis, anemia.   REQUESTING PHYSICIAN:  Dorris Singh, M.D. with [facility].   HISTORY OF PRESENT ILLNESS:  Ms. [name] is a 77 year old African American  female who presented to the hospital on 3 occasions recently with  complaints of vomiting and diarrhea.  She was seen in the ED and treated  on February 11, 2008 through February 13, 2008.  When she returned on February 14, 2008, she was admitted.  She complained of a 3-4 day history of  diarrhea.  She had an episode or two of vomiting.  She was complaining  of upper abdominal discomfort when she presented.  It was felt she  likely had gastroenteritis.  She has been treated supportively.  We have  been consulted regarding a drop in her hemoglobin since admission.   We saw her back in June 2009 when she presented with anorexia, vomiting,  diarrhea.  She also had some vague early satiety and burning in her  esophagus.  She underwent an EGD on December 05, 2007 and was found have  marked geographic ulcerations of the distal one-half of the tubular  esophagus with overlying exudate and severe ulcerative esophagitis.  Biopsies and KOH were negative for fungus or yeast.  She had a hiatal  hernia.  She had a 1 cm submucosal distal greater curvature nodule which  was going to be looked at a later date.  She had marked inflammatory  changes of the D1 and D2 with extensive ulceration of the duodenal bulb  in the second portion of the duodenum.  Biopsies were nonspecific.  She  had a gastrin level which was 343 and elevated.  H. pylori serologies  were negative.  On noncontrast CT of the abdomen and pelvis, she also  had an indistinctness of the pancreatic head with some surrounding  strandiness likely due to focal pancreatitis of the head of the  pancreas.  She is scheduled to have follow up CT in 3 months.   HOME MEDICATIONS:  1. Glipizide 4 mg daily.  2. Metformin 1 gm b.i.d.  3. Fluoxetine 20 mg b.i.d.  4. Enablex 7.5 mg daily.  5. Calcium daily.  6. Lisinopril 20 mg daily.  7. Tylenol p.r.n.  8. Seroquel 50 mg q.h.s.  9. Zofran p.r.n.  10.Lomotil p.r.n.  11.Coricidin p.r.n.  12.Imodium p.r.n.  13.She is supposed to be on a PPI, but this is not listed on her      medication list and the patient tells me she does not know whether      or not she is on it.  14.She was discharged in June on Protonix 40  mg b.i.d., but the      patient states she does not believe she has been on this recently.  15.She is no longer taking Mobic, any other NSAIDs or aspirin.   ALLERGIES:  PENICILLIN causes rash.   PAST MEDICAL HISTORY:  1. Diabetes mellitus.  2. Hypertension.  3. Depression.  4. Osteoarthritis.  5. Weight loss, she is down from 83 kg on December 02, 2007 to 74.6 kg this      admission.  6. EGD as outlined above.  7. Elevated gastrin level unclear significance at this time, may need      to be repeated fasting and not on PPI therapy.  8. History of right olecranon fracture requiring initial surgery      followed by a re-do because of post-traumatic injury.  Last surgery      was on December 09, 2007.   FAMILY HISTORY:  Negative for chronic GI illnesses or liver disease.   SOCIAL HISTORY:  She lives in [place] by herself.  She has an aide  who come 3 hours a day to help with her ADLs.  She typically ambulates  with a cane.  Denies alcohol or illicit drugs.   REVIEW OF SYSTEMS:  See HPI for GI.  See HPI for constitutional.  CARDIOPULMONARY:  Denies chest pain or shortness of breath,  palpitations.  GENITOURINARY:  Denies  dysuria, hematuria.   PHYSICAL EXAMINATION:  VITAL SIGNS:  Weight 74.6 kg, it had been 83 kg  in June, T-max current 99, pulse 98, respirations 20, blood pressure  138/77.  GENERAL:  Pleasant, thin, black female in no acute distress.  SKIN:  Warm and dry.  No jaundice.  HEENT:  Sclerae nonicteric.  Oropharyngeal  mucosa moist and pink.  She is edentulous. No lesions.  CHEST:  Lungs  are clear to auscultation.  CARDIAC:  Reveals regular rate and rhythm.  No murmurs, rubs or gallops.  ABDOMEN:  Positive bowel sounds.  Abdomen  is soft.  Abdomen is nontender.  No organomegaly or masses.  No rebound  or guarding.  LOWER EXTREMITIES:  No edema.   LABORATORY DATA:  Labs on admission:  Her hemoglobin was 10.9,  hematocrit 35.2, MCV 82.7, it is down to 8.7 today.  Please note on December 12, 2007 at time of her last discharge, her hemoglobin was 8.8.  Her  iron is low at 10, iron saturation is low at 5%, TIBC low at 206.  Ferritin was not done.  White count 3400, platelets 422,000, sodium 138,  potassium 3.7, BUN 4, creatinine 0.63, glucose 129, INR 1, today's total  bilirubin is 0.4, alkaline phosphatase 89, AST 23, ALT 43, albumin 2.5.  On admission for the last several days her AST was up to 132 and ALT as  high as 104 and have trended downward.  In June, her transaminases were  normal.   IMPRESSION:  [name] is a very pleasant 77 year old lady with a couple  month history now of  documented weight loss, history of anorexia, early  satiety and postprandial upper abdominal pain with significantly  abnormal EGD during her last admission.  She presents now with vomiting  and diarrhea which has resolved.  She may have had acute  gastroenteritis.  Interestingly, her transaminases were elevated  initially as well and they are now trending downward.  Her gallbladder  remains in situ.  We have been consulted regarding her anemia.  Notably  her hemoglobin today is very much comparable to  what it was  back at the  time of her discharge in June.  Suspect her drop in hemoglobin was  delusional with rehydration.  Her iron TIBC and saturations are most  consistent with anemia of chronic disease.  We will check a ferritin  level to exclude iron deficiency.  She has a history of elevated gastrin  level of unclear significance at this time.  Unfortunately, the patient  may have not been on her PPI as an outpatient and needs to be on this  chronically.   RECOMMENDATIONS:  1. Increase her Protonix to b.i.d. and we will switch to p.o.  2. Collect stools for studies including Hemoccults as previously      ordered.  Unfortunately, none of these have been collected.  3. Ferritin level  4. If she develops recurrent vomiting or abdominal pain, would recheck      her LFTs to see if there is a trend upward.  If so, would consider      abdominal ultrasound  5. She is scheduled for a follow up CT in 3 months as an outpatient      given her history of pancreatic abnormality.   Further recommendations to follow.  I would like to thank Dr. Dorris Singh for allowing [place] to take part in the care of this patient.      Tana Coast, P.A.      Kassie Mends, M.D.  Electronically Signed    LL/MEDQ  D:  02/16/2008  T:  02/16/2008  Job:  [id number]   cc:   Dorris Singh, DO   Milus Mallick. Lodema Hong, M.D.  Fax: [phone]

## 2010-11-11 NOTE — H&P (Signed)
NAMECARLY, [name]                  ACCOUNT NO.:  [id number]   MEDICAL RECORD NO.:  [id number]          PATIENT TYPE:  AMB   LOCATION:  DAY                           FACILITY:  [facility]   PHYSICIAN:  Tilford Pillar, MD      DATE OF BIRTH:  1931/12/15   DATE OF ADMISSION:  DATE OF DISCHARGE:  LH                              HISTORY & PHYSICAL   CHIEF COMPLAINTS:  Weight loss.   HISTORY OF PRESENT ILLNESS:  The patient is a 77 year old African-  American female who is a fairly poor historian and is actually not quite  sure of why she is presenting in general surgeon's office for  evaluation.  At this time, I did call Dr. [name] in the office  and talked to Dr. Lodema Hong in regards to her reason for referral.  Apparently, the patient has had unexplained weight loss over the last  several months and is coming due for a colonoscopy with her last  colonoscopy being in 2001.  In returning to discuss this with the  patient, she has denied any abdominal symptomatology.  She does admit to  having some weight loss that is unintentional, but attributed to her  diabetes mellitus.  She denies any bowel changes.  She denies any melena  or hematochezia.  She has had no history of diarrhea or constipation.  She has had no fever or chills.  She has had no change in urination.  She is not sure if she has had a colonoscopy in the interim since 2001  and she does not remember any findings on the last colonoscopy.  Again,  I have discussed with Dr. Lodema Hong and it appears that she had had some  diverticular disease at that time with diverticulosis, but no evidence  of any polyps or masses were noted at that time.   PAST MEDICAL HISTORY:  1. Diabetes mellitus type 2.  2. Hypertension.  3. Hyperlipidemia.  4. Depression.  5. Insomnia.  6. Degenerative joint disease.  7. Chronic neck and back pain.   PAST SURGICAL HISTORY:  She has had umbilical hernia repair.   MEDICATIONS:  Reviewed with the  patient.  She is currently on:  1. Glimepiride 4 mg 1 b.i.d.  2. Omeprazole 20 mg 2 p.o. daily.  3. Fluoxetine 20 mg 2 p.o. q.a.m.  4. Meloxicam 15 mg p.o. daily.  5. Lisinopril 20 mg p.o. daily.  She denies being on any other medications.   ALLERGIES:  She states she is allergic to PENICILLIN which produces a  rash and swelling.   SOCIAL HISTORY:  She is a tobacco chewer and does dip.  She denies any  tobacco smoking.  She denies any alcohol.  No recreational drug use.   REVIEW OF SYSTEMS:  GENERAL:  Relatively unremarkable.  CONSTITUTIONAL:  Unremarkable except for above-mentioned weight loss.  EYES:  Unremarkable.  EARS, NOSE AND THROAT:  Unremarkable.  RESPIRATORY:  She  denies any shortness of breath or wheezing.  CARDIOVASCULAR:  She denies  any chest pain or claudication.  GASTROINTESTINAL:  Unremarkable.  MUSCULOSKELETAL:  Unremarkable.  SKIN:  Unremarkable.  ENDOCRINE:  Unremarkable.  NEURO:  Unremarkable.   PHYSICAL EXAMINATION:  GENERAL:  The patient is an elderly-appearing  female, moderately obese with somewhat disheveled appearance.  She is  alert and oriented and does not appear to be in any acute distress.  HEENT:  Scalp, no deformities, no masses.  Eyes:  Pupils equal, round,  reactive.  Extraocular movements intact.  No conjunctival pallor is  noted.  No diminished hearing on evaluation today.  Oral mucosa is pink.  NECK:  Trachea is midline.  No cervical lymphadenopathy is appreciated.  PULMONARY:  Unlabored respirations.  She is clear to auscultation  bilaterally.  CARDIOVASCULAR:  Regular rate and rhythm, 2+ radial pulses.  ABDOMEN:  Positive bowel sounds.  Abdomen soft and nontender.  No  hernias and no masses appreciated.  SKIN:  Warm and dry.   PERTINENT LABORATORY STUDIES:  She has had a recent UA, liver panel, and  basic metabolic panel, which were all within normal limits.   ASSESSMENT AND PLAN:  Weight loss.  At this point, I would agree that   though I have a low suspicion of any cancer symptomatology other than  the weight loss, I would agree that proceeding with a colonoscopy would  be a possible course, and this was discussed at length with the patient.  I did discuss proceeding with a planned colonoscopy with my partner, Dr.  Franky Macho, who would perform the colonoscopy, but at this point, we  would get her set up for a screening colonoscopy.  The risks, benefits,  and alternatives were discussed with the patient.  It was also discussed  with the patient that she will be further evaluated and these risks and  benefits will be additionally explained to her prior to proceeding by my  partner.  At this point, we will plan to proceed with scheduling the  patient for her colonoscopy.      Tilford Pillar, MD  Electronically Signed     BZ/MEDQ  D:  11/01/2007  T:  11/02/2007  Job:  [id number]   cc:   Milus Mallick. Lodema Hong, M.D.  Fax: [phone]   [facility]  Fax: [phone]

## 2010-11-11 NOTE — H&P (Signed)
NAMESUMMERLYN, FICKEL                  ACCOUNT NO.:  [id number]   MEDICAL RECORD NO.:  [id number]          PATIENT TYPE:  INP   LOCATION:  A339                          FACILITY:  [facility]   PHYSICIAN:  Osvaldo Shipper, MD     DATE OF BIRTH:  Jan 20, 1932   DATE OF ADMISSION:  12/02/2007  DATE OF DISCHARGE:  LH                              HISTORY & PHYSICAL   PRIMARY MEDICAL DOCTOR:  Dr. [name].   ADMISSION DIAGNOSES:  1. Hyperkalemia.  2. Dehydration.  3. Likely gastroenteritis with acute diarrhea.  4. History of type 2 diabetes, hypertension, all stable.   CHIEF COMPLAINT:  Diarrhea since last night.   HISTORY OF PRESENT ILLNESS:  The patient is a 77 year old African  American female who lives alone at home who was in her usual state of  health until about yesterday, when she started having nausea, vomiting.  She had a couple of episodes.  This was followed by onset of diarrhea.  She came in actually to the ED yesterday.  She was discharged home from  the ED on Zofran.  Her labs did not look remarkably abnormal.  In any  case, the patient did not improve.  She continued to have a lot of  diarrhea.  She decided to come back in to the ED today after she went to  see her PMD.  The patient mentioned the stool is brown in color, is  liquidy watery.  Denies any mucus.  No blood in it.  She says she has  never had similar symptoms in the past.  She is no longer having any  nausea or vomiting.  She does admit to having some abdominal pain in the  mid abdomen, but it is not really bothering her too much at this time.  She admits to having a weight loss of about 6 pounds in the last few  months.  She has had a recent colonoscopy done a few weeks ago by Dr.  Lovell Sheehan, the report of which is not available on E-chart.  She denies  any sick contacts or any recent travel outside this area.  Denies any  recent antibiotic use.  She also fell and broke her right elbow a few  weeks ago and had  surgery a couple of weeks ago, and she tells me that  she is going to have another operation for the same in a week's time.   MEDICATIONS AT HOME:  She is on glimepiride 4 mg once a day, metformin  1000 mg b.i.d., fluoxetine 20 mg twice a day, Mobic 15 mg daily, Enablex  7.5 mg once a day, calcium 3 times a day, lisinopril 20 mg once a day,  promethazine 25 mg as needed, Tylenol Extra Strength as needed,  hydrocodone/acetaminophen 7.5/650 as needed, Seroquel 50 mg at night,  ciprofloxacin 500 mg twice a day.  I do not know when the Cipro was  started.  She is also on Zofran and Lomotil as needed.  This was started  by the ED physicians.   ALLERGIES:  INCLUDE PENICILLIN.   PAST  MEDICAL HISTORY:  Positive for diabetes, hypertension, depression.  She also has arthritis.  She admits to having weight loss for the past  few months.   SOCIAL HISTORY:  She lives in [place] alone.  No smoking, alcohol or  illicit drug use.  She uses a cane to ambulate.   FAMILY HISTORY:  Noncontributory.   REVIEW OF SYSTEMS:  Positive for weakness, malaise, and then as per HPI,  otherwise nothing remarkable.   PHYSICAL EXAMINATION:  VITAL SIGNS:  When she was seen in the ED today  she was found to have a temperature of 99.0, blood pressure 150/86,  heart rate 114, respiratory rate 20, saturation 95% on room air.  Recent  vital signs show temperature 99.7, heart rate 123, regular, respiratory  rate is 20, blood pressure is 155/75, saturation 97% on room air.  GENERAL:  This is an elderly African American female in no distress.  HEENT:  There is no pallor, no icterus.  Oral mucous membranes slightly  dry.  No oral lesions are noted.  NECK:  Soft and supple.  No thyromegaly is appreciated.  LUNGS:  Clear to auscultation bilaterally.  No wheezing, rales or  rhonchi.  CARDIOVASCULAR:  S1 and S2 is tachycardic, regular.  No murmurs  appreciated.  ABDOMEN:  Soft.  There is tenderness in the epigastric and  the right  upper quadrant as well as around the umbilicus.  No rebound, rigidity or  guarding is present.  Bowel sounds are hyperactive.  No masses are  present.  EXTREMITIES:  Show no edema.  Peripheral pulses are palpable.  NEUROLOGIC:  She is alert, oriented x3.  No focal neurological deficits  are present.   LABORATORIES:  Her white count is 11.4, hemoglobin is 11.1, MCV 92,  platelet count 626, potassium is 5.9, was 6.6 earlier.  Sodium is 128.  Glucose is 267.  BUN is 13, creatinine is 1.09.  UA done yesterday was  unremarkable.  She had an EKG which showed sinus tachycardia with a  normal axis.  No definite Q waves identified.  No ST/T wave changes of  concern noted.   ASSESSMENT:  This is a 77 year old African American female who presents  with a 1-day history of nausea, vomiting and diarrhea.  History  consistent with gastroenteritis.  Concerning parts are weight loss,  abdominal pain.  This could also be diabetic gastropathy and  enteropathy, and other intra-abdominal processes could also be  accounting.  Pancreatitis is less likely in this patient.   A C. diff is a possibility, but she denies recent antibiotic use, though  her med rec sheet states the Cipro, so I am not quite sure which history  is accurate here.   PLAN:  1. Acute diarrhea, likely as a part of gastroenteritis.  In any case,      we will send stool studies, we will give her IV fluids, correct the      dehydration.  We will actually get a CAT scan in the morning to      make sure there is no concerning intra-abdominal process,      especially considering her weight loss.  We will also check      pancreatic enzymes and liver function tests.  2. Hyperkalemia.  Etiology unclear.  She is on an ACE inhibitor, but      renal function was normal.  This could also be all a part of her GI      symptomatology.  The potassium has  already come down.  I will      recheck another level now to make sure it continues to  go down, if      not we will have to again give her some more Kayexalate.  She did      receive Kayexalate and D50 and insulin this morning.  3. Diabetes, stable.  Sliding scale will be initiated.  Other issues      appear to be quite stable at this time, including      hypertension.  We will try to obtain a report of a colonoscopy that      was done recently.  I think once the patient's symptoms improve she      can be discharged and will need followup with her PMD.  We will      also follow up on the results of her CAT scan, which can be done      tomorrow morning.  There is no urgency at this point.      Osvaldo Shipper, MD  Electronically Signed     GK/MEDQ  D:  12/02/2007  T:  12/02/2007  Job:  [id number]   cc:   Milus Mallick. Lodema Hong, M.D.  Fax: [phone]

## 2010-11-11 NOTE — Group Therapy Note (Signed)
NAME:  [name], [name]                  ACCOUNT NO.:  [id number]   MEDICAL RECORD NO.:  [id number]          PATIENT TYPE:  INP   LOCATION:  A302                          FACILITY:  [facility]   PHYSICIAN:  Dorris Singh, DO    DATE OF BIRTH:  1932/06/07   DATE OF PROCEDURE:  02/16/2008  DATE OF DISCHARGE:                                 PROGRESS NOTE   The patient is seen today and states that she feels better.  She is able  to eat with less nausea.  I have been following her blood work the last  couple of days, doing serial H&H's.  Her hemoglobin dropped from 10 to  around 8.7 and it has been fluctuating in the 8s.  I went ahead and got  an iron study and it showed she is low in iron as well as her TIBC and  her iron saturation.  Her last admission she actually had back in June  an EGD done on which they found ulcerative esophagitis.  I will go ahead  and transfuse her today and consult GI to come and take a look at her in  case this is a recurrence of this.  I first suspected that it could be  hemodilutional effect.  However, it has continued to stay below 9 and  that is almost 2 units from when she came in.   PHYSICAL EXAMINATION:  VITAL SIGNS:  Her vitals for today are as  follows.  They are not recorded or have not been done.  I will review  them when they are completed.  Her vitals are 99 temperature, pulse 98,  respirations 20 and blood pressure is 130/77.  GENERAL:  The patient is a 77 year old African American female who is  well-developed, well-nourished and in no acute distress.  HEART:  Regular rate and rhythm.  LUNGS:  Lungs are clear to auscultation bilaterally.  ABDOMEN:  Soft, nontender.  EXTREMITIES:  Positive pulses.  No ecchymosis, edema or cyanosis.   LABS:  For today white count is 3.4, hemoglobin 8.7, her other  hemoglobin was 8.6.  Hematocrit is 27.7, platelet count of 422 and her  glucose level has been 208 capillary.  Sodium is 138, potassium 3.7,  chloride 104,  CO2 27, glucose 129, BUN 4 and creatinine 0.3.  Her ALT is  43 which is trending down from the other day.   ASSESSMENT AND PLAN:  1. Anemia.  2. Acute gastroenteritis.  3. Type 2 diabetes.  4. Abnormal liver function tests.  5. Hypertension.  6. hypokaemia   PLAN:  Her acute gastroenteritis is resolving.  She has been able to eat  and has not had any diarrhea or nausea and vomiting.  Her anemia, this  has been trending down.  We will go ahead and transfuse her and consult  GI.  Her type 2 diabetes has been under control.  Will continue current  management for that and her hypertension has also under control.      Dorris Singh, DO  Electronically Signed     CB/MEDQ  D:  02/16/2008  T:  02/16/2008  Job:  [id number]

## 2010-11-11 NOTE — Op Note (Signed)
NAME:  [name], [name]                  ACCOUNT NO.:  [id number]   MEDICAL RECORD NO.:  [id number]          PATIENT TYPE:  INP   LOCATION:  A339                          FACILITY:  [facility]   PHYSICIAN:  R. Roetta Sessions, M.D. DATE OF BIRTH:  1932-02-19   DATE OF PROCEDURE:  12/05/2007  DATE OF DISCHARGE:                               OPERATIVE REPORT   EGD with esophageal brushings for KOH biopsy followed by duodenal  biopsy.   INDICATIONS FOR PROCEDURE:  The patient is a 77 year old lady, admitted  to the hospital with failure to thrive, nausea, vomiting, weight loss,  and some diarrhea.  CT demonstrated thickening and stranding around the  pancreatic head and proximal duodenum, also marked thickening of the  distal esophagus.  She has had some vague odynophagia and dysphagia  recently.  EGD is now being done.  Risks, benefits, alternatives and  limitations have been reviewed.  All questions are answered.  All  parties are agreeable.  Please see documentation for the medical record.   PROCEDURE NOTE:  O2 saturation, blood pressure, pulse, and respirations  were monitored throughout the entire procedure.   CONSCIOUS SEDATION:  Versed 5 mg IV and Demerol 75 mg IV in divided  doses.  Cetacaine spray for topical pharyngeal anesthesia.   INSTRUMENT:  Pentax video chip system.   FINDINGS:  EGD examination of tubular esophagus revealed geographic  ulcerations of the distal one-third of the tubular esophagus with  overlying cheesy exudate.  I did not identify anything that looked like  a neoplasm.  Esophageal lumen was widely patent and EG junction was  easily traversed.  The stomach:  Gas cavity was emptied and insufflated  well with air.  A thorough examination of the gastric mucosa including  retroflexed view of the proximal stomach and esophagogastric junction  demonstrated 1-cm submucosal nodule in the distal greater curvature,  please see photos and a moderate-sized hiatal hernia.   Gastric mucosa  otherwise appeared normal.  Pylorus was patent and easily traversed.  Examination of the bulb and second and third portion was undertaken.  The patient had a markedly abnormal duodenum, extensive ulceration of  the bulb and second portion with 3 large ulcers in the bulb with smaller  satellite ulcers extending into the proximal second portion of the  duodenum.  Please see multiple photographs.  Therapeutic/diagnostic  maneuvers performed:  Esophagus was biopsied for histology and brushed  for KOH prep, subsequently one of the small ulcers in the second portion  of the duodenum was biopsied for histologic study.  Gradually, the  patient tolerated the procedure and was reactive in endoscopy.   IMPRESSION:  1. Marked geographic ulcerations of the distal one-third of the      tubular esophagus with overlying exudate, this was severe      ulcerative esophagitis, status post potassium hydroxide brushing      and biopsy.  2. Hiatal hernia.  3. One-cm submucosal distal greater curvature nodule of uncertain      significance, otherwise normal gastric mucosa.  4. Marked inflammatory changes of D1 and D2, extensive  ulceration of      the bulb and second portion ascribes status post biopsy.   Given today's findings within the clinical scenario, I have had to be  concerned about the possibility of hypersecretory state, i.e., Zollinger-  Ellison syndrome.  I also in the differential would include Helicobacter  pylori infection and lymphoma (latter being less likely).   1. Check fasting gastrin level tomorrow morning.  Obtain Helicobacter      pylori serologies.  Follow up on pending biopsies.  2. Consider further evaluation of gastric submucosal nodule at a later      time.  3. Increase proton pump inhibitor therapy to b.i.d.  4. We will advance her to a low-fat, carbohydrate-modified diet for      the time being.  Further recommendations to follow.      Jonathon Bellows,  M.D.  Electronically Signed     RMR/MEDQ  D:  12/05/2007  T:  12/06/2007  Job:  [id number]   cc:   Milus Mallick. Lodema Hong, M.D.  Fax: [phone]

## 2010-11-11 NOTE — Group Therapy Note (Signed)
[name], [name]                  ACCOUNT NO.:  [id number]   MEDICAL RECORD NO.:  [id number]          PATIENT TYPE:  INP   LOCATION:  A339                          FACILITY:  [facility]   PHYSICIAN:  Margaretmary Dys, M.D.DATE OF BIRTH:  01-Sep-1931   DATE OF PROCEDURE:  12/06/2007  DATE OF DISCHARGE:                                 PROGRESS NOTE   SUBJECTIVE:  Patient had an EGD yesterday which showed marked  ulcerations of the distal and third of the esophagus with some exudates.  Also has severe ulcerative esophagitis.  There are also inflammatory  areas of the duodenum and extensive ulceration.  There was concern for  possible Zollinger-Ellison syndrome.  Blood work has been sent for  gastrin levels and Helicobacter pylori serologies.  We will thus  increase her Protonix to b.i.d.   Patient reports improvement in her pain, although still hurting.  No  nausea or vomiting.   OBJECTIVE:  Conscious, alert, comfortable.  Not in acute distress.  VITAL SIGNS:  Stable.  Blood pressure was 153/84.  Pulse 114.  Respirations 20.  Temperature 99.2 degrees Fahrenheit.  Oxygen  saturation 95% on room air.  HEENT:  Normocephalic and atraumatic.  Oral mucosa is moist with no  exudates.  NECK:  Supple.  No JVD.  LUNGS:  Clear clinically with good air entry bilaterally.  HEART:  S1 and S2 regular.  ABDOMEN:  Soft, nontender.  Bowel sounds positive.  No mass palpable.  EXTREMITIES:  No edema.   LABORATORY/DIAGNOSTIC DATA:  Helicobacter pylori antibody was 0.6, which  will be negative.  A KOH prep was also negative.   ASSESSMENT:  1. Acute abdominal pain.  2. Severe esophagitis and extensive ulcerations in the duodenum,      raising concern for Zollinger-Ellison syndrome.  3. Acute gastroenteritis.   PLAN:  1. We appreciate gastroenterology input.  Patient will continue on      Protonix b.i.d. for now.  2. Patient's diet will be advanced today.  3. Will continue pain control.  4. Continue  sliding-scale insulin as needed.  5. Will monitor patient over the next 24-48 hours, and patient may be      discharged home and scheduled for followup with gastroenterology,      Dr. Jena Gauss.  6. Patient is scheduled for her right elbow surgery this Friday.      Margaretmary Dys, M.D.  Electronically Signed     AM/MEDQ  D:  12/06/2007  T:  12/06/2007  Job:  [id number]

## 2010-11-11 NOTE — Letter (Signed)
July 08, 2007    Milus Mallick. Lodema Hong, M.D.  [address]  [place], [place] [address]   RE:  KEYLIN, FERRYMAN  MRN:  [id number]  /  DOB:  04/30/1932   Dear Claris Che:   It was my pleasure evaluating Ms. Ellena in the office today in  consultation at your request for dyspnea on exertion.  As you know, I  last saw this nice woman approximately 4 years ago when she was  experiencing some chest discomfort.  A stress nuclear study was negative  at that time.  Left ventricular systolic function was normal.  She has  done well since then with no significant new medical problems. She has  not been hospitalized nor undergone surgery.  She does not require  urgent medical care.  She describes chronic dyspnea on exertion that is  little changed of late.  She has a sedentary lifestyle.  She has no  history of pulmonary disease or cardiac disease.  She has never been  known to be anemic.   CURRENT MEDICATIONS:  1. Glimepiride 4 mg b.i.d.  2. Metformin 1000 mg b.i.d.  3. Prozac 40 mg daily.  4. Mobic 15 mg daily.  5. Benicar 20 mg daily.  6. Zyprexa 5 mg daily.  7. Vesicare 5 mg daily.  8. Prilosec 40 mg daily.  9. Calcium carbonate t.i.d.  10.Temazepam and acetaminophen on a p.r.n. basis.   Past medical history, social history, family history and review of  systems were updated.  There were no notable additions.   PHYSICAL EXAMINATION:  A pleasant woman in no acute distress.  The  weight is 193, 13 pounds more than in October 2005.  Blood pressure  135/80, heart rate 80 and regular, respirations 14, O2 saturation 96%.  HEENT:  EOMs full; on funduscopic exam, there is mild arteriolar  narrowing.  NECK:  No jugular venous distention; normal carotid upstrokes without  bruits.  LUNGS:  Clear.  CARDIAC:  Normal first and second heart sounds; fourth heart sound  present.  ABDOMEN:  Soft and nontender; no organomegaly.  EXTREMITIES:  Trace edema; normal distal pulses.   IMPRESSION:  Ms. Dupas  has chronic dyspnea on exertion, probably related  to excessive weight and physical deconditioning. We will perform a basic  workup to include a chest a chest x-ray, chemistry profile, CBC, TSH  level and BNP level.  A stress echocardiogram will be performed.  If  results are negative, I would recommend an exercise program.  I will  reevaluate this nice woman after the above studies have been completed.    Sincerely,      Gerrit Friends. Dietrich Pates, MD, [facility]  Electronically Signed    RMR/MedQ  DD: 07/08/2007  DT: 07/08/2007  Job #: [id number]

## 2010-11-11 NOTE — Group Therapy Note (Signed)
NAME:  [name], [name]                  ACCOUNT NO.:  [id number]   MEDICAL RECORD NO.:  [id number]          PATIENT TYPE:  INP   LOCATION:  A339                          FACILITY:  [facility]   PHYSICIAN:  Lucita Ferrara, MD         DATE OF BIRTH:  07/04/1931   DATE OF PROCEDURE:  12/10/2007  DATE OF DISCHARGE:                                 PROGRESS NOTE   SUBJECTIVE:  The patient is status post sinus, open reduction and  triceps advancement, irrigation, debridement and an excision of a sinus  tract in the right elbow.  She is okay to stay in extension splint and  get it braced to limit flexion.   PHYSICAL EXAMINATION:  GENERAL:  Again, the patient is examined by  bedside.  She has no specific complaints.  VITAL SIGNS:  Her temperature 99.1, pulse 122, respirations 16, blood  pressure 123/69, pulse ox 96% on room air.  CBG is 110/171/195.  HEENT:  Normocephalic, atraumatic.  LUNGS:  Clear.  CARDIOVASCULAR:  S1, S2.  ABDOMEN:  Soft, nontender, nondistended.  Positive bowel sounds.  EXTREMITIES:  No clubbing, cyanosis or edema.   LABORATORY DATA:  None.  Her gastrin level is 343 which is quite high  actually, but, it could be in the reference range of Zollinger-Ellison  syndrome.  CBC shows anemia at 9.4, yesterday's CBC was 9.3.   ASSESSMENT/PLAN:  1. Right olecranon fracture status post treatment with open treatment      internal fixation May 14, recurrent falls during the postoperative      period which tore apart the fixation and I could not go to surgical      procedure.  2. Acute abdominal pain and workup for acute abdominal pain and      gastroenteritis, so far showing elevated gastrin level.  3. Severe esophagitis now resolved.  4. Extensive ulceration of duodenum.  5. Acute gastroenteritis.  6. Status post endoscopy.   PLAN:  The patient is able to proceed with orthopedic procedure.  Will  continue current medical management.  Status post open reduction,  triceps advancement,  irrigation debridement and excision of the sinus  tract right elbow.  Will continue with orthopedic recommendations.      Lucita Ferrara, MD  Electronically Signed     RR/MEDQ  D:  12/10/2007  T:  12/10/2007  Job:  [id number]

## 2010-11-11 NOTE — Op Note (Signed)
NAMENYASIAH, MOFFET                  ACCOUNT NO.:  [id number]   MEDICAL RECORD NO.:  [id number]          PATIENT TYPE:  AMB   LOCATION:  DAY                           FACILITY:  [facility]   PHYSICIAN:  Vickki Hearing, M.D.DATE OF BIRTH:  25-Jan-1932   DATE OF PROCEDURE:  DATE OF DISCHARGE:                               OPERATIVE REPORT   DATE OF INJURY:  Nov 03, 2007.   MECHANISM:  Fall.   HISTORY:  A 77 year old female who fell at home, fractured her right  elbow, and has a displaced right olecranon fracture which is unstable  and requires internal fixation.   PREOPERATIVE DIAGNOSIS:  Closed right olecranon fracture.   POSTOPERATIVE DIAGNOSIS:  Closed right olecranon fracture.   PROCEDURES:  Open treatment internal fixation of the right olecranon.   SURGEON:  Vickki Hearing .MD   ASSISTANT:  [place].   ANESTHETIC:  General.   FINDINGS:  Displaced right olecranon fracture.   SPECIMENS:  None.   COUNTS:  Correct as reported.   COMPLICATIONS:  None.   BLOOD LOSS:  Minimal.   DISPOSITION:  The patient to PACU in good condition.   PROCEDURE PERFORMED:  First we identified Deaira Leckey Fouty in the  preoperative area.  We countersigned her marking of the right upper  extremity as the surgical site.  We updated her history and physical.  She had vancomycin started.  She was taken to surgery.  She had general  anesthetic.  Her right elbow was prepped with Betadine due to some skin  abrasions.  We draped it sterilely.  We took the time-out and everyone  agreed on the procedure.   Tourniquet was elevated after exsanguination of the limb with an  Esmarch.   A curvilinear incision was made to avoid the skin abrasions.  This was  made over the elbow, extended 4 cm proximally and 7 cm distally.  Subcu  flaps were elevated full thickness.  Fracture was identified, irrigated,  and debrided and then held in place with a bone clamp.  Two K-wires were  then placed under C-arm  guidance to exit the anterior cortex of the  distal fragment.  A drill was passed to the ulnar shaft, equidistant  from the fracture site, as the fracture was from the tip of the  olecranon.  An 18-gauge wire was passed, crisscrossed, and then passed  under the K-wires, and the K-wires were bent and buried.  The wire was  cut and buried.  Radiographs confirmed reduction.  Range of motion test:  5-135 degrees flexion, full pronation, and supination.  Radiographs  looked great.   Wound was irrigated and closed with  #1 Vicryl and 2-0 Monocryl as well  as staples, and 30 mL of Marcaine injected around the wound for post op  anesthetic.   POSTOPERATIVE PLAN:  The patient will follow up on next Tuesday for  dressing and splint change, then the staples and cuff can come out the  following week, and range of motion exercises can be begun at that  point.  Vickki Hearing, M.D.  Electronically Signed     SEH/MEDQ  D:  11/10/2007  T:  11/11/2007  Job:  [id number]

## 2010-11-11 NOTE — Discharge Summary (Signed)
[name], [name]                  ACCOUNT NO.:  [id number]   MEDICAL RECORD NO.:  [id number]          PATIENT TYPE:  INP   LOCATION:  A335                          FACILITY:  [facility]   PHYSICIAN:  Skeet Latch, DO    DATE OF BIRTH:  [birth year]   DATE OF ADMISSION:  10/20/2008  DATE OF DISCHARGE:  LH                               DISCHARGE SUMMARY   DISCHARGE DIAGNOSES:  1. Non-insulin-dependent diabetes with hyperglycemic episodes,      improved.  2. Urinary frequency.  3. History of overactive bladder.  4. History of arthritis.  5. History of hypertension.  6. History of depression.   BRIEF HOSPITAL COURSE:  This is a 77 year old African American female  who was complaining some urinary frequency as well as hyperglycemic  episodes.  The patient reported over the last few days and now she has  been having more polyuria and polydipsia.  The patient seen in the  emergency room 2 times earlier for blood sugars.  She says last night  she had an episode of urinary incontinence.  The patient states her  blood sugar was usually around 300.  When she presented to the ER, it  was in the 400 range.  The patient denies any changes in her diabetic  medications or her medical condition.  The patient denies any chest  pain, abdominal pain, or any other associated-type symptoms.  She was  admitted to the hospital, placed on her home medications.  She was also  found to be hyponatremic.  She is placed on IV fluids.  Her sodium level  was returned back to normal range.  She is placed on home medications  for hypertension, depression.  The patient was placed on subcu Lantus  for her blood sugars and her sugars have come down at this time.  Also  for urinary frequency, seems to be chronic in nature, her Enablex has  been increased.  At this time, the patient is stable to be discharged  home.   DISCHARGE MEDICATIONS:  1. Loperamide 4 mg twice a day.  2. Metformin 1000 mg daily.  3. Paroxetine  20 mg 2 tabs daily.  4. Mobic 15 mg daily.  5. Enablex 15 mg daily.  6. Calcium with vitamin D 3 tabs a day.  7. Lisinopril 20 mg daily.  8. Promethazine 25 mg one half p.o. as needed.  9. Tylenol Extra Strength 500 mg as needed.  10.Hydrochlorothiazide 7.5 mg  11.Acetaminophen 150 mg as needed.  12.Seroquel 50 mg at bedtime.  13.Zofran 4 mg every 6-8 hours as needed.  14.Femara as needed.  15.Loratadine 10 mg once a day.  16.Nexium 40 mg once a day.  17.Potassium 10 mEq daily.   VITAL SIGNS ON DISCHARGE:  Temperature is 97.7, pulse 86, respirations  18, blood pressure 136/76, sating 94% on room air.   LABORATORY DATA:  Sodium 135, potassium 3.7, chloride 103, CO2 of 26,  glucose 144, BUN 10, creatinine 0.89.   DISCHARGE CONDITION:  Stable.   DISPOSITION:  The patient will be discharged home.   DISCHARGE INSTRUCTIONS:  The patient is to follow up with Dr. Lodema Hong in  the next 5-7 days.  She is to maintain the diabetic diet.  Increase her  activity to previous.  She is take all medications as prescribed.  The  patient is to check her blood sugars 2-3 times daily until seen by her  primary care physician.  The patient is to return to the emergency room  if she has any more problems with extreme high blood sugars and call  [phone].      Skeet Latch, DO  Electronically Signed     SM/MEDQ  D:  10/23/2008  T:  10/24/2008  Job:  [id number]   cc:   Dr. Lodema Hong

## 2010-11-11 NOTE — Discharge Summary (Signed)
[name], [name]                  ACCOUNT NO.:  [id number]   MEDICAL RECORD NO.:  [id number]          PATIENT TYPE:  INP   LOCATION:  A339                          FACILITY:  [facility]   PHYSICIAN:  Lucita Ferrara, MD         DATE OF BIRTH:  11/17/1931   DATE OF ADMISSION:  12/02/2007  DATE OF DISCHARGE:  06/15/2009LH                               DISCHARGE SUMMARY   DISCHARGE DIAGNOSIS:  1. Acute diarrhea, resolved.  Negative stool ovum and parasites and      Clostridium difficile x3 negative.  2. Hyperkalemia. resolved.  3. Abnormal CT scan showing thickening of the esophageal mucosa.  4. Evidence of focal pancreatitis at the head of the pancreas per CT      scan.  5. Geographic ulceration of the distal one-third of the tubular      esophagus consistent with esophagitis.  6. Status post EGD showing geographic ulceration, hiatal hernia and 1      cm submucosal distal curvature nodule, biopsy showing inflate      duodenal mucosa and no malignancy.  7. Fracture of the right olecranon with hardware failure status post      traumatic, status post open reduction of the triceps advancement,      irrigation, debridement, and excision of the sinus tract of the      right elbow.  8. Deconditioning.  9. Diabetes.  10.Osteoarthritis.   CONSULTANTS:  1. Dr. Fuller Canada.  2. Gastroenterology, Dr. Jena Gauss.   PROCEDURES:  The patient had an EGD with esophageal brushing for KOH  biopsy of duodenal biopsy dated December 02, 2007.  The patient had an open  reduction triceps advancement, irrigation, debridement and excision of  the sinus tract, right elbow.  The patient had a CT scan of the abdomen  and pelvis dated December 03, 2007 which showed marked thickening of the  mucus of the distal esophagus suspicious of esophageal carcinoma.  Recommended biopsy.  The patient had a CT scan of the abdomen without  contrast which showed sigmoid colon diverticula.  Appendix appears  normal.  The patient had a  digital elbow two-view which showed rural  hardware utilized fusion of the olecranon electron fracture fragment  distracted approximately by 1.1 cm with overlying soft tissue swelling.Marland Kitchen   BRIEF HISTORY OF PRESENT ILLNESS AND HOSPITAL COURSE:  Ms. Cookston was  admitted on December 02, 2007 with acute diarrhea symptoms consistent with  gastritis.  She was hyperkalemic.  The patient had an abdominal CT scan  with results above, suspicious for pancreatitis versus carcinoma.  Consultations were made with Dr. Jena Gauss from gastroenterology.  She  underwent EGD procedures with biopsy with negative results; results  above.  Workup for diarrhea to date is negative, with an exception of an  elevated gastrin level.  She underwent an orthopedic procedure on December 09, 2007.  Now she is status post day three, status post removal of the  hardware of the right elbow secondary to post-traumatic disruption of  the hardware, sinus tract excision, advancement of the triceps and  application  of the posterior splint.  She is stable from an orthopedic  standpoint at this point.  She is advised to follow up postoperatively  with wound check on day #13 with Dr. Romeo Apple.  Also, she will be  following up with Dr. Jena Gauss from GI services in regards to elevated  gastric levels.   DISCHARGE MEDICATIONS:  1. Calcium carbonate 500 mg p.o. t.i.d.  2. Enablex 7.5 mg p.o. daily.  3. Prozac 40 mg p.o. daily.  4. Amaryl 4 mg p.o. daily a.c.  5. Insulin sliding scale.  6. Metformin 100 mg p.o. b.i.d.  7. Protonix 40 mg p.o. b.i.d.  8. Seroquel 50 mg p.o. q.h.s.  9. Tylenol 650 mg p.o. q.6h. p.r.n.  10.Morphine sulfate 3 mg p.o. IV q.6h. p.r.n.  11.Zofran 4 mg IV q.8h. p.r.n.  12.Zofran 4 mg p.o. q.4h. p.r.n.   DISCHARGE CONDITION:  Stable.   DISCHARGE VITALS:  Temperature 97.9, pulse 95, respirations 20, blood  pressure 135/71, pulse oximetry 95%.   DISCHARGE ACTIVITY:  To be determined by continued PT and OT evaluation  at  the nursing home facility.   DISCHARGE DIET:  A full liquid diet for now.  Dysphagia recommendations  need to be assessed per ST at the nursing home facility.      Lucita Ferrara, MD  Electronically Signed     RR/MEDQ  D:  12/12/2007  T:  12/12/2007  Job:  [id number]   cc:   Nicoletta Dress. Colon Branch, M.D.  Fax: [phone]   Vickki Hearing, M.D.  Fax: [phone]   R. Roetta Sessions, M.D.  [address]  [place]   [address]

## 2010-11-11 NOTE — Procedures (Signed)
NAMEDONICIA, DRUCK                  ACCOUNT NO.:  [id number]   MEDICAL RECORD NO.:  [id number]          PATIENT TYPE:  OUT   LOCATION:  RAD                           FACILITY:  [facility]   PHYSICIAN:  Gerrit Friends. Dietrich Pates, MD, FACCDATE OF BIRTH:  1931-09-11   DATE OF PROCEDURE:  08/22/2007  DATE OF DISCHARGE:                                ECHOCARDIOGRAM   REFERRING:  Drs. Simpson and [employer].   CLINICAL DATA:  A 77 year old woman with chest pain.  1. Baseline echocardiogram:  Normal chamber dimensions; normal aortic,      mitral and tricuspid valves; normal LV systolic function with      borderline LVH.  2. Treadmill exercise performed to a workload of 3 METS and a heart      rate of 143, 97% of age-predicted maximum.  Exercise discontinued      due to fatigue, leg fatigue and dyspnea.  3. Blood pressure increased from a resting value of 150/75 to 210/80      at peak exercise, a mildly hypertensive response.  4. Baseline EKG:  Normal sinus rhythm; within normal limits.  5. Stress EKG:  No significant change.  6. Post-exercise echocardiogram:  Decrease in left ventricular size      and an increase in contractility in all segments with cavity      obliteration during systole.  7. Oxygen saturation monitored throughout the study and remained above      95%.   IMPRESSION:  Negative stress echocardiogram revealing severely impaired  exercise capacity, a rapid increase in heart rate and low level exercise  consistent with physical deconditioning, no stress-induced EKG  abnormalities and no echocardiographic evidence for ischemia or  infarction.  Other findings as noted.      Gerrit Friends. Dietrich Pates, MD, [facility]  Electronically Signed     RMR/MEDQ  D:  08/23/2007  T:  08/23/2007  Job:  [id number] [id number]

## 2010-11-11 NOTE — Group Therapy Note (Signed)
NAME:  [name], [name]                  ACCOUNT NO.:  [id number]   MEDICAL RECORD NO.:  [id number]          PATIENT TYPE:  INP   LOCATION:  A339                          FACILITY:  [facility]   PHYSICIAN:  Lucita Ferrara, MD         DATE OF BIRTH:  [birth year]   DATE OF PROCEDURE:  DATE OF DISCHARGE:                                 PROGRESS NOTE   The patient examined by bedside today.  There were no new events  overnight.  She may be going for her orthopedic procedure today.  Again,  just reviewing her history, she had a right olecranon fracture.  She was  treated with open reduction with internal fixation on Nov 10, 2007.  Initial injury was Nov 03, 2007.  She fell again post-op and fractured  and tore apart the fixation.  She was pre-op for surgery, but she  developed medical problems including hyperkalemia with dehydration,  gastroenteritis with diarrhea, uncontrolled diabetes, and hypertension.  Currently she is medically stable.   VITAL SIGNS:  Today temperature 98.1, pulse 93, respirations 20, blood  pressure 147/95.  CBG is 128/737/121/177.   PHYSICAL EXAMINATION:  HEENT:  Normocephalic, atraumatic.  LUNGS:  Clear to auscultation.  CARDIOVASCULAR:  S1, S2.  ABDOMEN:  Soft, nontender.  EXTREMITIES:  No clubbing, cyanosis or edema.   LABORATORY DATA:  CBC shows mildly low hemoglobin 9.4/28.6, MCV is 91.3.  Her ova and parasite is negative.  Fecal  Occult Blood is negative.  Her  hemoglobin is yet stable.  Her complete metabolic panel was normal.  C.  Diff toxin normal.  H. pylori antibody is negative.  Stool for occult  blood is negative.   ASSESSMENT/PLAN:  1. Right olecranon fracture status post treatment with open treatment      and internal fixation on May 14th, recurrent falls during the      postoperative period, which tore apart the fixation and then could      not go to surgical procedure.  2. Acute abdominal pain.  3. Severe esophagitis which has now resolved.  4. Extensive  ulceration of the duodenum.  5. Acute gastroenteritis.  6. Status post endoscopy results showing geographic ulceration of the      distal one third of the tubular esophagus status post CT scan of      the abdomen and pelvis which showed marked thickening of the mucus      of the distal esophagus with suspicion of esophageal cancer.      Surgical pathology esophagus shows squamous  mucosa with associated      surface erosions, ulcers, and fibrin exudate.  No evidence of      fungal-viral changes or metaplasia-dysplasia identified.  The      duodenum showed inflamed duodenal mucosa with associated chronic      inflammation of the surface ulceration.  No evidence of dysphagia      or malignancy identified.   PLAN:  Would be essentially to proceed with any orthopedic procedure and  continue medical management of the rest of her medical problems and  the  rest of the hospital course depending on her progress.      Lucita Ferrara, MD  Electronically Signed     RR/MEDQ  D:  12/09/2007  T:  12/09/2007  Job:  [id number]

## 2010-11-11 NOTE — Group Therapy Note (Signed)
[name], [name]                  ACCOUNT NO.:  [id number]   MEDICAL RECORD NO.:  [id number]          PATIENT TYPE:  INP   LOCATION:  A339                          FACILITY:  [facility]   PHYSICIAN:  Margaretmary Dys, M.D.DATE OF BIRTH:  September 04, 1931   DATE OF PROCEDURE:  12/05/2007  DATE OF DISCHARGE:                                 PROGRESS NOTE   SUBJECTIVE:  She feels slightly better today.  She has had no more  diarrhea, nausea or vomiting.  The patient is currently scheduled for an  EGD today.   The patient does have significantly abnormal CT scan.   OBJECTIVE:  Conscious, alert.  Comfortable not in acute distress.  VITAL SIGNS:  Blood pressure is 146/83, pulse of 103, respirations 20,  temperature 98.5, oxygen saturation is 95% on room air.  HEENT exam normocephalic atraumatic.  Oropharynx was moist.  No exudates  were noted.  NECK:  Supple.  No JVD, lymphadenopathy.  LUNGS:  Reduced air entry bilaterally.  Abdomen was obese but soft.  Some mild tenderness in the left lower  quadrant.  EXTREMITIES:  No edema.  CNS:  Exam was grossly intact.   LABORATORY/DIAGNOSTIC DATA:  White blood cell count 6.1, hemoglobin 9.3,  hematocrit 27.4, platelet count was 500,000 with no left shift.  PT  12.8, INR is 0.9.  Sodium 133, potassium 4.0, chloride 102, CO2 of 26,  glucose 103, BUN 4, creatinine was 0.7, calcium is 8.3.  Hemoglobin A1c  was 6.2.   ASSESSMENT/PLAN:  1. Acute abdominal pain.  2. Acute gastroenteritis.  3. Abnormal CT scan of the abdomen showing esophageal mucosal      thickening and pancreatic head inflammation and duodenal      thickening.   PLAN:  1. The patient is scheduled for an endoscopy today.  2. Continue IV fluids for now.  She the patient remains n.p.o. pending      that procedure.  3. Continue pain control.  4. Continue sliding scale insulin.  Hemoglobin A1c was close to normal      though.  5. Disposition depends on what we find on her endoscopic  studies.      Margaretmary Dys, M.D.  Electronically Signed     AM/MEDQ  D:  12/05/2007  T:  12/05/2007  Job:  [id number]

## 2010-11-11 NOTE — H&P (Signed)
NAMEAVAREY, YAEGER                  ACCOUNT NO.:  [id number]   MEDICAL RECORD NO.:  [id number]          PATIENT TYPE:  INP   LOCATION:  A335                          FACILITY:  [facility]   PHYSICIAN:  Skeet Latch, DO    DATE OF BIRTH:  [birth year]   DATE OF ADMISSION:  10/20/2008  DATE OF DISCHARGE:  LH                              HISTORY & PHYSICAL   PRIMARY CARE PHYSICIAN:  Dr. Orinda Kenner   CHIEF COMPLAINT:  Urinary frequency.   HISTORY OF PRESENT ILLNESS:  This is a 77 year-old [place] American  female who presents with urinary frequency.  The patient states she has  been eating poorly over the last few days she has noticed she has been  having more polyuria and polydipsia.  The patient was seen in the  emergency room twice for elevated blood sugars and stated that last  night she had problems with urinary incontinence.  The patient states  that her blood sugars usually run in the 200 to 300s and when she  presented to emergency room was found to be in the 400 range.  The  patient denies any changes in her diabetes medications or any changes in  her medical condition.  The patient denies any chest pain or abdominal  pain or any other associated symptoms at this time.   PAST MEDICAL HISTORY:  1. Noninsulin dependent diabetes mellitus.  2. Arthritis.  3. Hypertension.  4. Hiatal hernia.  5. Depression.   ALLERGIES:  PENICILLIN.   HOME MEDICATIONS:  1. Loperamide 4 mg twice a day.  2. Metformin 1000 mg daily.  3. Fluoxetine 20 mg two tablets daily.  4. Mobic 15 mg daily.  5. Enablex 7.5 mg daily.  6. Calcium plus vitamin D three times a day.  7. Lisinopril 20 mg daily.  8. __________ 25 mg 1/2 p.o. p.r.n..  9. Tylenol Extra 500 mg as needed.  10.Hydrocodone 7.5/ acetaminophen as needed.  11.__________ 50 mg at bedtime.  12.Zofran 4 mg q.6-8 h as needed.  13.Lomotil as needed.  14.Loratadine 10 mg once daily.  15.Nexium 40 mg once daily.  16.Potassium 10 mEq  daily.   PAST SURGICAL HISTORY:  Open reduction triceps;  advancement,  irrigation, and debridement incision of sinus tract; right elbow  surgery; hernia repair; hip replacement.   SOCIAL HISTORY:  No history of smoking, alcohol use or drug use.   FAMILY HISTORY:  Noncontributory.   REVIEW OF SYSTEMS:  Please see HPI.   PHYSICAL EXAMINATION:  VITAL SIGNS:  Temperature 98.7, pulse 84,  respiratory rate 20, blood pressure 145/87, saturating 98% on room air.  GENERAL:  She is well hydrated, well developed, well nourished in no  acute distress.  HEENT:  Head is normocephalic, atraumatic  Oral mucosa is dry.  NECK:  Soft, supple, nontender, nondistended.  CARDIOVASCULAR:  Regular rate and rhythm.  No murmurs, rubs or gallops.  LUNGS:  Clear to auscultation bilaterally. No rales, rhonchi or  wheezing.  ABDOMEN:  Soft, nontender, nondistended.  Positive bowel sounds. No  rigidity or guarding.  EXTREMITIES:  No  clubbing, cyanosis or edema.  NEUROLOGIC:  Cranial nerves II through XII grossly intact. The patient  is alert and oriented x 3.   LABORATORY DATA:  Microscopic urine showed yeast, a few bacteria.  Urinalysis - greater than 1000 glucose, trace of blood, white blood cell  count 5.8, hemoglobin 12.6, hematocrit 37.0, platelet count  189,000.  Sodium 131, potassium 3.9, chloride 197, CO2 24, glucose 443, BUN 11,  creatinine 0.98.   ASSESSMENT/ PLAN:  This is a 77 year-old [place] American female who  presents with urinary frequency and one night of urinary incontinence  with history of noninsulin dependent diabetes mellitus.  1. On being examined in the emergency room, the patient found to be      hyponatremic.  Also per her lab, slightly dehydrated.  Plan:  The      patient will be admitted to the service of [facility] to general      medical bed.  2. For her hypoglycemia, with her history of type 2 diabetes, the      patient is on glimepiride and metformin.  Will add some Lantus  to      her daily therapy and that probably will need to be adjusted to      stabilize her blood sugars.  Will get hemoglobin A1c in the a.m.  3. Hyponatremia.  Slightly decreased.  Will Hep lock her IV at this      time.  Will not give IV fluids secondary to the patient's polyuria      and incontinence at this time.  Recheck her sodium level in the      a.m.  4. History of hypertension.  The patient will be placed on home      medications.  5. Depression.  The patient will be placed on home medications for her      depression.  6. Pending her results from her blood sugars with Lantus, she may need      diabetes team consultation.  7. The patient will be on DVT as well as GI prophylaxis.      Skeet Latch, DO  Electronically Signed     SM/MEDQ  D:  10/20/2008  T:  10/20/2008  Job:  [id number]

## 2010-11-11 NOTE — Group Therapy Note (Signed)
[name], [name]                  ACCOUNT NO.:  [id number]   MEDICAL RECORD NO.:  [id number]          PATIENT TYPE:  INP   LOCATION:  A339                          FACILITY:  [facility]   PHYSICIAN:  Lucita Ferrara, MD         DATE OF BIRTH:  [birth year]   DATE OF PROCEDURE:  12/08/2007  DATE OF DISCHARGE:                                 PROGRESS NOTE   The patient examined by bedside.  She has no specific complaints.  Just  going through the history, the patient was admitted back on December 02, 2007, with hyperkalemia, dehydration, gastroenteritis, versus diarrhea.  Her CT scan was essentially abnormal showing marked thickening of the  mucosa of the distal esophagus suspicious for esophageal cancer.  Also  the head of the pancreas showed some stranding with concern of  pancreatitis at the head of pancreas.  Dr. Jena Gauss with gastroenterology  was consulted, and a diagnostic EGD was recommended with a pancreatic  protocol CT at some point.  She underwent an EGD which showed geographic  ulceration of the distal one-third of the tubular esophagus, hiatal  hernia, gastric level was checked and proton pump inhibitory was  increased to b.i.d. Protonix.  Diet was continued to be advanced.  The  patient also got a consultation by Dr. Romeo Apple for possible fixing of  the elbow.   PHYSICAL EXAM:  VITAL SIGNS:  Temperature 97.8, pulse 84, respirations  80, blood pressure 118/80.  CBG is 177/134/136.  HEENT:  Normocephalic, atraumatic.  PERLA.  LUNGS:  Clear to auscultation bilaterally.  ABDOMEN:  Soft, nontender.  EXTREMITIES:  No clubbing, cyanosis or edema.   LABORATORY DATA:  Stool culture negative.  Ova and parasites negative  for stool.  Fecal __________ negative.  CBC shows mild anemia at 9.2.  Basic metabolic panel normal.  C. difficile toxin is negative.  Occult  blood negative.  IgG is negative KOH prep no a yeast.   ASSESSMENT/PLAN:  1. Right olecranon fracture, status post treatment with open  treatment      and internal fixation on Nov 10, 2007.  Recurrent falls during the      postoperative period which tore apart the fixation, and then she      developed the medical problems described above.  In the interim she      could not go for surgical procedure.  2. Acute abdominal pain.  3. Severe esophagitis.  4. Extensive ulceration of the duodenum.  5. Acute gastroenteritis.  6. Status post endoscopy results above.   PLAN:  I do believe that she would probably be ready for any type of  orthopedic intervention as long as it relieves her symptoms, increases  her range of motion, and relieves her pain.  Continue per GI  recommendations.  The patient's symptomology is much better with  diabetic control.  I expect that she will be discharged in the next 2 or  3 days per surgery and per orthopedic surgery.      Lucita Ferrara, MD  Electronically Signed     RR/MEDQ  D:  12/08/2007  T:  12/08/2007  Job:  [id number]

## 2010-11-11 NOTE — Group Therapy Note (Signed)
[name], [name]                  ACCOUNT NO.:  [id number]   MEDICAL RECORD NO.:  [id number]          PATIENT TYPE:  INP   LOCATION:  A339                          FACILITY:  [facility]   PHYSICIAN:  Vickki Hearing, M.D.DATE OF BIRTH:  05-Mar-1932   DATE OF PROCEDURE:  DATE OF DISCHARGE:                                 PROGRESS NOTE   She is a 77 year old female with a right olecranon fracture.  She was  treated with open treatment and internal fixation on Nov 10, 2007,  initial injury was Nov 03, 2007.  She fell again in the postop period,  landed directly on her right elbow and fractured and tore apart the  fixation.  She was preop for surgery, but developed several medical  problems and she is now hospitalized, admitted by Dr. Rito Ehrlich for Dr.  Lodema Hong, with hyperkalemia, dehydration, gastroenteritis with diarrhea,  uncontrolled diabetes, and hypertension.  Her diabetes has since been  controlled, however, it was initial cause of her fall after the surgery.   Medications are listed in the inpatient record.  She is allergic to  PENICILLIN.  She has arthritis.  She has had some weight loss over the  last few months.  She lives in alone in [place].  Does not smoke or  drink or use illicit drugs.  She uses a cane to ambulate.  Family  history is noncontributory.   REVIEW OF SYSTEMS:  Weakness and malaise recently, otherwise, noted low  glucose levels prior to this admission.   Exam is essentially unchanged.  She still has pain and loss of extension  in the right elbow and will need right elbow open treatment and internal  fixation.   It is unclear whether she will be ready for surgery on December 09, 2007.      Vickki Hearing, M.D.  Electronically Signed     SEH/MEDQ  D:  12/08/2007  T:  12/08/2007  Job:  [id number]

## 2010-11-11 NOTE — Group Therapy Note (Signed)
NAME:  [name], [name]                  ACCOUNT NO.:  [id number]   MEDICAL RECORD NO.:  [id number]          PATIENT TYPE:  INP   LOCATION:  A302                          FACILITY:  [facility]   PHYSICIAN:  Dorris Singh, DO    DATE OF BIRTH:  [birth year]   DATE OF PROCEDURE:  DATE OF DISCHARGE:                                 PROGRESS NOTE   HISTORY:  The patient is seen here today.  States she is able to keep  down liquids.  We will go ahead and advance her diet, and see how she  does.  Also we will look at home health may be coming in and following  her for a couple days.  We are increasing her contact hours that she  currently has.   PHYSICAL EXAMINATION:  VITAL SIGNS:  Blood pressure 119/63, temperature  99.2, pulse 89 and respirations 18.  GENERAL:  The patient is a 76-year-  old [place] American female who is well-developed, well-nourished in no  acute distress.  HEART:  Regular rate and rhythm.  LUNGS:  Clear to auscultation bilaterally.  ABDOMEN:  Positive hyperactive bowel sounds.  No tenderness noted or  distention.  EXTREMITIES:  Positive pulses.  No ecchymosis, edema or  cyanosis.   LABORATORY DATA:  Her labs for today are as follows:  White count 5.3,  hemoglobin 8.7, hematocrit 27.6, platelet count of 448.  Chemistries:  Sodium 135, potassium 4.1, chloride 108, CO2 23, glucose 103, BUN 8 and  creatinine 0.68.  Her liver enzymes were elevated yesterday.  Today, AST  is 36, ALT is 62.  She has pending stool cultures as well.   ASSESSMENT:  1. Acute gastroenteritis.  2. Mild metabolic acidosis.  3. Anemia.  4. Type 2 diabetes.  5. Abnormal liver function tests.  6. Hypertension.   PLAN:  1. We will continue with supportive care for another 24 hours and      advance her diet.  See how she does with that and may be able to      discharge in the 24-48 hours.  2. Metabolic acidosis.  It seems to be correcting and will continue.  3. Dehydration.  She has been hydrated with  several liters of fluid,      so this may be a cause of her anemia.  We will go ahead and check      her with serial H&H, iron study panel and Hemoccult stool.  If any      of these come up positive, we will consider a GI consult.  If not,      we will attribute it to hemodilution.  4. Type 2 diabetes.  She is currently on sliding scale.  5. Abnormal LFTs.  Her LFTs are trending down.  We will continue to      monitor them for tomorrow.  6. Hypertension is under control.  7. Plan on discharging the patient in the next 24-48 hours.      Dorris Singh, DO  Electronically Signed     CB/MEDQ  D:  02/15/2008  T:  02/15/2008  Job:  [id number] [id number]

## 2010-11-11 NOTE — Letter (Signed)
September 12, 2007    Milus Mallick. Lodema Hong, M.D.  [address], [address]  [place], [place] [address]   RE:  [name], [name]  MRN:  [id number]  /  DOB:  10-30-1931   Dear [name]:   [name] returns to the office for continued assessment and treatment of  dyspnea.  Since her last visit, she developed a URI.  Her chronic  dyspnea does not appear to be significantly changed.  She performed a  stress echocardiogram, which was negative for ischemia or infarction.  Very poor exercise tolerance was noted.  There was no exercise-induced  hypoxemia.  Similarly, her basic laboratory tests are normal including a  normal TSH and normal BMP level.   PHYSICAL EXAMINATION:  VITAL SIGNS:  Weight is 186, 7 pounds less than  in January.  Blood pressure 120/60, heart rate 100 and regular,  respirations 18.  NECK:  No jugular venous distention; normal carotid upstrokes without  bruits.  LUNGS:  Clear.  CARDIAC:  Normal S1, S2.  S4 present.  ABDOMEN:  Soft and nontender.  EXTREMITIES:  No edema.   IMPRESSION:  [name] appears still to be suffering from physical  deconditioning.  I suggested she start walking on a daily basis.  She  does not have transportation to allow her to come to the cardiopulmonary  rehabilitation program.  Current medical therapy appears appropriate.  I  will reassess this nice woman in 7 months.    Sincerely,      Gerrit Friends. Dietrich Pates, MD, [facility]  Electronically Signed    RMR/MedQ  DD: 09/12/2007  DT: 09/13/2007  Job #: [id number] [id number]

## 2010-11-11 NOTE — Group Therapy Note (Signed)
NAME:  [name], [name]                  ACCOUNT NO.:  [id number]   MEDICAL RECORD NO.:  [id number]          PATIENT TYPE:  INP   LOCATION:  A302                          FACILITY:  [facility]   PHYSICIAN:  Dorris Singh, DO    DATE OF BIRTH:  [birth year]   DATE OF PROCEDURE:  02/17/2008  DATE OF DISCHARGE:                                 PROGRESS NOTE   Patient seen today resting comfortably in bed.  States she feels better.  No complaints.  She did have a loose stool this a.m., so that has been  sent for evaluation.  She has had no nausea and vomiting as well, and  she feels pretty good.   PHYSICAL EXAMINATION:  VITAL SIGNS TODAY:  Temperature 99.4, pulse 97,  respirations 20, blood pressure 155/84.  GENERAL:  The patient is a 77 year old African American female who is  well developed, well nourished, in no acute distress.  HEART:  Regular rate and rhythm.  S1 and S2 present.  LUNGS:  Clear to auscultation bilaterally.  No wheezes, rales, or  rhonchi.  ABDOMEN:  Soft, nontender, nondistended.  EXTREMITIES:  Positive pulses.  No ecchymosis, erythema, or cyanosis.   LABORATORIES:  CBC:  White count 3.8, hemoglobin 11.6 and 11.2,  hematocrit 35.5 and 34.5, platelets of 404.  Her sodium is 136,  potassium 3.2, chloride 99, CO2 30, glucose 131, BUN 3, creatinine 0.67.   ASSESSMENT AND PLAN:  1. Anemia.  This has resolved after the patient received 2 L of blood.      GI is on the case.  They are monitoring her.  Also have sent her      stools for studies, and will talk.  Also, Hemoccults are being      collected.  2. Acute gastroenteritis.  This seems to be resolving.  She had one      episode of loose stools today.  However, she has not had any nausea      or vomiting.  3. Type 2 diabetes.  This is being controlled.  It is under      management.  4. Abnormal liver function tests.  Those are trending down from her      last visit and have resolved from her last test.  5. Hypertension.  The  patient is on her blood pressure medication.      She tends to run high earlier in the morning.  This might be taken      right before her blood pressure medication is given.  Will continue      to monitor, and at discharge may adjust her medications.  6. hypokalemia will replace      Dorris Singh, DO  Electronically Signed    CB/MEDQ  D:  02/17/2008  T:  02/17/2008  Job:  [id number]

## 2010-11-11 NOTE — Group Therapy Note (Signed)
[name], [name]                  ACCOUNT NO.:  [id number]   MEDICAL RECORD NO.:  [id number]          PATIENT TYPE:  INP   LOCATION:  A339                          FACILITY:  [facility]   PHYSICIAN:  Lucita Ferrara, MD         DATE OF BIRTH:  05/12/1932   DATE OF PROCEDURE:  DATE OF DISCHARGE:                                 PROGRESS NOTE   The patient has no complaints.  Denies nausea, vomiting or diarrhea.   PHYSICAL EXAMINATION:  VITAL SIGNS:  98.1, pulse 83, respirations 20,  blood pressure 157/78.  CBG is 117/125/105.  HEENT:  Normocephalic, atraumatic.  CARDIOVASCULAR:  S1-S2.  ABDOMEN:  Soft, nontender, nondistended.  Positive bowel sounds.  EXTREMITIES:  No clubbing, cyanosis or edema.   LABORATORY DATA:  Phos 4.2, magnesium 1.7.  Complete metabolic panel  within normal limits.  B natriuretic peptide less than 10.  D-dimer high  at 0.7.  ABGs normal.  TSH  0.257.  CT angio of the chest on December 10, 2007, shows nonspecific nodule inferior pole of the left thyroid  measuring 1.8 x 1.5 cm.   ASSESSMENT/PLAN:  1. Chronic diarrhea, predominant irritable bowel syndrome since 1996,      followed by Dr. Juanda Chance in [place].  2. Chronic back pain.  3. Anxiety.  4. Bipolar disorder.  5. Migraine headache.  6. Status post breast reduction  7. Abdominal pain, chest pain, shortness of breath.  8. Rule out pancreatitis, irritable bowel syndrome, versus      diverticulitis.  9. Thyroid nodule with mildly low thyroid-stimulating hormone.   PLAN:  Will continue per GI recommendations.  Continue to monitor  amylase, lipase and CBC today.  CT scan of the abdomen and pelvis with  IV contrast.  Will go ahead and get free T3-T4.  She is currently on  Lopressor.  She is not tachycardia to think that she is hyperthyroid,  yet she probably needs to have follow-up on the thyroid nodule at some  point.  The rest of the plans will depend on her progress.      Lucita Ferrara, MD  Electronically  Signed    RR/MEDQ  D:  12/12/2007  T:  12/12/2007  Job:  [id number]

## 2010-11-11 NOTE — Group Therapy Note (Signed)
[name], VANDERWEIDE                  ACCOUNT NO.:  [id number]   MEDICAL RECORD NO.:  [id number]          PATIENT TYPE:  INP   LOCATION:  A339                          FACILITY:  [facility]   PHYSICIAN:  Margaretmary Dys, M.D.DATE OF BIRTH:  01-31-1932   DATE OF PROCEDURE:  12/04/2007  DATE OF DISCHARGE:                                 PROGRESS NOTE   SUBJECTIVE:  The patient reports some improvement in her diarrhea and  abdominal pain.  CT scan findings were discussed with Dr. Jena Gauss, the  gastroenterologist.  The patient has been scheduled for endoscopy  studies tomorrow.   OBJECTIVE:  GENERAL:  Conscious, alert, comfortable, not in acute  distress.  VITAL SIGNS:  Blood pressure is 158/88, pulse of 103, respirations 18,  temperature 99 degrees Fahrenheit, oxygen saturation 96% on room air.  HEENT:  Normocephalic, atraumatic.  Mucous membranes moist.  No exudates  were noted.  NECK:  Supple.  No JVD or lymphadenopathy.  LUNGS:  Reduced air entry bilaterally.  No crackles, wheezing or rhonchi  were heard.  HEART:  S1-S2 regular.  No S3, S4, gallops or rubs.  ABDOMEN:  Soft nontender.  Bowel sounds positive.  No masses palpable.  EXTREMITIES:  No pitting pedal edema.   LABORATORY AND DIAGNOSTIC DATA:  White blood cell count 8.8, hemoglobin  9.5, hematocrit 27.8, platelet count 503 with 40% neutrophils.  Sodium  130, potassium 4, chloride of 98, CO2 27, glucose 182, BUN of 5,  creatinine 0.77, calcium 8.2.   ASSESSMENT:  1. Acute abdominal pain.  2. Acute gastroenteritis.  3. Abnormal CT scan showing esophageal thickening and pancreatic head      inflammation and duodenal thickening.   PLAN:  1. The patient is to have endoscopy tomorrow.  2. Continue IV fluids for now.  3. Will continue pain control.  4. Potassium is normal.  5. Continue sliding scale insulin.  6. We will await results for endoscopy tomorrow.  The patient may need      ERCP for further evaluation of her pancreatic  tree,      Margaretmary Dys, M.D.  Electronically Signed     AM/MEDQ  D:  12/04/2007  T:  12/04/2007  Job:  [id number]

## 2010-11-11 NOTE — Group Therapy Note (Signed)
[name], [name]                  ACCOUNT NO.:  [id number]   MEDICAL RECORD NO.:  [id number]          PATIENT TYPE:  INP   LOCATION:  A339                          FACILITY:  [facility]   PHYSICIAN:  Lucita Ferrara, MD         DATE OF BIRTH:  03/31/1932   DATE OF PROCEDURE:  12/12/2007  DATE OF DISCHARGE:                                 PROGRESS NOTE   Patient examined by bedside today.  No specific complaints today.   OBJECTIVE:  VITAL SIGNS: Temperature 97.9, pulse 95, respirations 20,  blood pressure 135/71, pulse oximetry 95%.  HEENT/NECK:  Normocephalic, atraumatic.  Sclerae anicteric.  Neck is  supple.  No JVD or carotid bruits.  PERLA, extraocular movements are  intact.  CARDIOVASCULAR:  S1-S2.  LUNGS:  Clear to auscultation bilaterally.  No rales, rhonchi, or  wheezes.  EXTREMITIES:  No clubbing, cyanosis, or edema.   LABORATORY DATA:  Complete metabolic panel normal.  CBC anemia 8.8 prior  9.4.  Ova parasites none.  Fecal bacteriform none.  H. pylori none. Hip  x-ray:  Right hip prosthesis in appropriate setting of position.   ASSESSMENT/PLAN:  1. Diarrhea.  2. Anorexia, nausea, vomiting, diarrhea.  3. Elevated gastrin level.  4. Status post esophagogastroduodenoscopy showing geographic      ulceration, hiatal hernia, and a 1 cm submucosal distal curvature      nodule.  Biopsy showing inflamed duodenal mucosa.  5. Diarrhea with stool for O&P, and C. difficile negative.  6. Fracture of the right olecranon with hardware failure, post-      traumatic, status post open reduction of the triceps advancement,      irrigation debridement, and excision of the sinus tract of the      right elbow, postop day #3.  7. Deconditioned.  8. Diabetes.  9. Osteoarthritis.   PLAN:  Will continue current treatment.  I do believe that most of her  problems are now resolved.  We will monitor her hemoglobin.  She may be  able to go within the next 24-48 hours.  Social work for placement, and  rehab.      Lucita Ferrara, MD  Electronically Signed     RR/MEDQ  D:  12/12/2007  T:  12/12/2007  Job:  [id number]   cc:   Nicoletta Dress. Colon Branch, M.D.  Fax: [phone]

## 2010-11-11 NOTE — Group Therapy Note (Signed)
NAMEJACKEE, [name]                  ACCOUNT NO.:  [id number]   MEDICAL RECORD NO.:  [id number]          PATIENT TYPE:  INP   LOCATION:  A339                          FACILITY:  [facility]   PHYSICIAN:  Margaretmary Dys, M.D.DATE OF BIRTH:  02/14/1932   DATE OF PROCEDURE:  12/03/2007  DATE OF DISCHARGE:                                 PROGRESS NOTE   SUBJECTIVE:  The patient was admitted yesterday with diarrhea and  hyperkalemia.  The patient has some gastroenteritis too and diabetes.   The patient is scheduled to have a CT scan today due to concerns for  possible abdominal process.  The CT scan has been done and the findings  are fairly abnormal.  I will be requesting GI to see her.   OBJECTIVE:  GENERAL:  The patient was conscious and alert.  She was  having some pain in the periumbilical area.  VITAL SIGNS:  Blood  pressure is 142/67 with a pulse of 114, respirations 16, temperature  97.9.  Oxygen saturation was 96% on room air.  HEENT:  Normocephalic, atraumatic.  Oral mucosa was moist with no  exudates.  NECK:  Supple.  No JVD, no lymphadenopathy.  LUNGS:  Reduced air entry bilaterally.  No crackles, wheezes or rhonchi.  HEART:  S1-S2 regular.  ABDOMEN:  Soft, although the patient had some fullness around the  periumbilical area and some mild generalized tenderness with no rebound.  EXTREMITIES:  No pedal or pitting edema.   LABORATORY DATA:  White blood cell count is 11.4, hemoglobin 10.8,  hematocrit 31.7, platelet count 590.  Neutrophils were 83%.  Sodium 132,  potassium 4.4, chloride 101, CO2 is 22, glucose 163, BUN of 11,  creatinine was 0.88, calcium 7.9.   CT scan of the abdomen and pelvis revealed marked thickening of the  mucosa of the distal esophagus, suspicious for esophageal cancer.  Also  the head of the pancreas showed some strandriness with the concern  pancreatitis in the head of the pancreas.  There was also some  thickening of the duodenal mucosa.  The sigmoid  colon was normal except  for some diverticula.  Appendix was normal.   ASSESSMENT/PLAN:  1. Acute abdominal pain.  2. Severe diarrhea.  3. Hyperkalemia.  4. Abnormal CT scan showing thickening of the esophageal mucosa.  5. Evidence of focal pancreatitis in the head of the pancreas,      although this raises concern for possible carcinoma.   PLAN:  1. Will keep patient n.p.o. for now.  Will continue IV fluid      hydration.  Will request gastroenterology consult.      Her potassium is currently corrected.  2. Continue sliding-scale insulin.  This will be communicated to the      patient.  The patient may need further endoscopy studies.      Margaretmary Dys, M.D.  Electronically Signed     AM/MEDQ  D:  12/03/2007  T:  12/03/2007  Job:  [id number]

## 2010-11-11 NOTE — H&P (Signed)
NAME:  [name], [name]                  ACCOUNT NO.:  [id number]   MEDICAL RECORD NO.:  [id number]          PATIENT TYPE:  AMB   LOCATION:  DAY                           FACILITY:  [facility]   PHYSICIAN:  Dalia Heading, M.D.  DATE OF BIRTH:  [birth year]   DATE OF ADMISSION:  DATE OF DISCHARGE:  LH                              HISTORY & PHYSICAL   CHIEF COMPLAINT:  Weight loss of unknown etiology, need for screening  colonoscopy.   HISTORY OF PRESENT ILLNESS:  The patient is a 77 year old black female  with multiple medical problems who presents with weight loss of unknown  etiology.  She last had a colonoscopy in 2001.  She denies any abdominal  pain, hematochezia, melena, abnormal diarrhea, or constipation.   PAST MEDICAL HISTORY:  1. Hypertension.  2. Type 2 diabetes.  3. Depression.   PAST SURGICAL HISTORY:  Noncontributory.   CURRENT MEDICATIONS:  Glyburide, omeprazole, tramadol, Zyprexa,  metformin, hydroxyzine, fluoxetine, Vytorin, Naprosyn, VESIcare, Actos,  Mobic, temazepam, and lisinopril.   ALLERGIES:  No known drug allergies.   REVIEW OF SYSTEMS:  Noncontributory.   PHYSICAL EXAMINATION:  The patient is a pleasant 77 year old white  female in no acute distress.  LUNGS:  Clear to auscultation with equal breath sounds bilaterally.  HEART:  Reveals regular rate and rhythm without S3, S4, or murmurs.  ABDOMEN:  Soft, nontender, and nondistended.  No hepatosplenomegaly or  masses noted.  RECTAL:  Deferred to the procedure.   IMPRESSION:  Weight loss of unknown etiology, need for screening  colonoscopy.   PLAN:  The patient is scheduled for a colonoscopy on Nov 08, 2007.  The  risks and benefits of the procedure were fully explained to the patient  and gave informed consent.      Dalia Heading, M.D.  Electronically Signed     MAJ/MEDQ  D:  11/02/2007  T:  11/03/2007  Job:  [id number]   cc:   Milus Mallick. Lodema Hong, M.D.  Fax: [phone]   [facility]  Fax: [phone]

## 2010-11-11 NOTE — Group Therapy Note (Signed)
NAMECONCEPTION, DOEBLER                  ACCOUNT NO.:  [id number]   MEDICAL RECORD NO.:  [id number]          PATIENT TYPE:  INP   LOCATION:  A339                          FACILITY:  [facility]   PHYSICIAN:  Lucita Ferrara, MD         DATE OF BIRTH:  1932/05/18   DATE OF PROCEDURE:  DATE OF DISCHARGE:                                 PROGRESS NOTE   Patient examined by bedside today.  She has no specific complaints.  She  denies chest pain, shortness of breath, nausea or vomiting.   VITAL SIGNS:  Temperature 98.1, pulse 101, respirations 18, blood  pressure 149/82.  HEENT:  Normocephalic, atraumatic.  Sclerae anicteric.  PERRLA.  Extraocular muscles intact.  NECK:  Supple.  No JVD.  No carotid bruits.  LUNGS:  Clear to auscultation bilaterally.  ABDOMEN:  Soft, nontender, nondistended.  Positive bowel sounds.  EXTREMITIES:  No clubbing, cyanosis, or edema.   LABORATORY DATA:  Stool culture pending.  Fecal electroform negative.  CBC:  Hemoglobin 9.3, hematocrit 27.6.  BMET:  Normal with the exception  of moderately high glucose of 113.   ASSESSMENT AND PLAN:  1. Acute abdominal pain.  2. Severe esophageal and extensive ulcerations in the duodenum raising      concern for E-syndrome.  3. Acute gastroenteritis.  4. Status post esophagogastroduodenoscopy showing a hiatal hernia,      marked geographic ulceration of the distal one-third of the tubular      esophagus.   PLAN:  Continue current management.  Advance diet slowly and as  tolerated.  Pain control.  Sliding scale insulin.  The patient can  likely be discharged in the next 1-2 days.  Patient actually scheduled  for right elbow surgery Friday.  DVT and GI prophylaxis.      Lucita Ferrara, MD  Electronically Signed     RR/MEDQ  D:  12/07/2007  T:  12/07/2007  Job:  [id number]

## 2010-11-11 NOTE — Letter (Signed)
March 16, 2008    Milus Mallick. Lodema Hong, M.D.  [address]  [place], [place] [address]   RE:  HALEIGH, DESMITH  MRN:  [id number]  /  DOB:  [birth year]   Dear Claris Che:   Ms. Buffalo returns to the office for continued assessment and treatment of  exertional dyspnea and exercise intolerance.  Since I last saw her, she  required hospital admission in August for an acute GI illness that  included nausea, emesis, erosive esophagitis, and LFT abnormalities.  She was also anemic at that time.  She has recovered well since  returning home and is now asymptomatic.  She does not do much around the  house, but has an aide that helps her 5 days per week.  She denies  dyspnea or chest discomfort.   Current medications are extensive.  Drugs of cardiac interests include  Benicar 20 mg daily, Zyprexa 5 mg daily, and KCl 10 mEq daily.   PHYSICAL EXAMINATION:  GENERAL:  On exam, pleasant woman with slightly  slurred speech in no acute distress.  VITAL SIGNS:  The weight is 165, 21 pounds less than in March.  Blood  pressure 135/75, heart rate 100 and regular, respirations 16.  NECK:  No jugular venous distention; normal carotid upstrokes without  bruits.  LUNGS:  Clear with coarse breath sounds at the bases.  CARDIAC:  Normal first and second heart sounds; fourth heart sound  noted.  ABDOMEN:  Soft and nontender; no organomegaly; normal bowel sounds  without bruits.  EXTREMITIES:  Distal pulses intact.   RHYTHM STRIP:  Sinus tachycardia.   IMPRESSION:  Ms. Barbone is doing well from a cardiovascular standpoint.  Hypertension is adequately controlled.  You may wish to treat her with a  statin as she has coronary equivalent and that she requires treatment  for diabetes.  A lipid profile in March was excellent with total  cholesterol of 155, HDL of 67, triglycerides of 101, and LDL of 68.  The  benefit of treatment with a statin started out with such good profile  and with no known coronary disease is  somewhat speculative.   Due to the laboratory abnormalities at the time of discharge a few weeks  ago, a chemistry profile and CBC will be obtained with the results  forwarded to your office.  I will not plan to see this nice woman  routinely - please call me whenever I can be of assistance in her care.    Sincerely,      Gerrit Friends. Dietrich Pates, MD, [facility]  Electronically Signed    RMR/MedQ  DD: 03/16/2008  DT: 03/17/2008  Job #: [id number]

## 2010-11-11 NOTE — H&P (Signed)
[name], [name]                  ACCOUNT NO.:  [id number]   MEDICAL RECORD NO.:  [id number]          PATIENT TYPE:  INP   LOCATION:  A302                          FACILITY:  [facility]   PHYSICIAN:  Osvaldo Shipper, MD     DATE OF BIRTH:  1932-01-11   DATE OF ADMISSION:  02/14/2008  DATE OF DISCHARGE:  LH                              HISTORY & PHYSICAL   PRIMARY MEDICAL DOCTOR:  Dr. [name].   ADMITTING DIAGNOSES:  1. Acute gastroenteritis.  2. Mild metabolic acidosis.  3. Dehydration.  4. Type 2 diabetes.  5. Abnormal LFTs, unclear etiology.  6. Hypertension.   CHIEF COMPLAINT:  Diarrhea.   HISTORY OF PRESENT ILLNESS:  The patient is a 77 year old African  American female who was last admitted back in June of this year to [facility] with gastroenteritis, diarrhea, hyperkalemia, and  dehydration.  The patient was also found to have an olecranon fracture  of the right side, for which she underwent an operative procedure.  The  patient mentioned that her diarrhea recurred over the last 3 to 4 days.  She came into the ED on August 15 with complaints of abdominal pain.  She was sent home after treatment.  She came in August 17 with  complaints of headache and diarrhea.  She was again sent home and now  she is back in the hospital today with complaints of diarrhea and  abdominal pain.  She mentioned these symptoms started a few days ago.  She has had numerous episodes of diarrhea with watery stool without any  blood in it.  She has had at least 2 episodes of emesis this morning,  which was clear as well.  Abdominal pain is located in the upper  abdomen.  The patient is a poor historian, and not much detail is  available from her at this time.  She denies any recent antibiotic use.  She mentions she has been taking her Protonix as prescribed to her  during her last discharge.  She has not seen Dr. Lodema Hong recently.   Denies any chest pain, shortness of breath,  dizziness, or light-  headedness.   MEDICATIONS AT HOME:  1. Lisinopril 20 mg daily.  2. Tylenol as needed.  3. Lomotil and Imodium as needed.  4. Amaryl 4 mg daily.  5. Enablex 07.5 mg daily.  6. Prozac 40 mg daily.  7. Zofran as needed.  8. Seroquel 50 mg at bedtime.  9. Calcium plus vitamin D 3 times daily.  10.Metformin 1000 mg b.i.d.  11.Phenergan as needed.   ALLERGIES:  Include penicillin.   PAST MEDICAL HISTORY:  She underwent open reduction and triceps  advancement irrigation debridement and excision of a sinus tract of the  right elbow on June 12.  She also underwent EGD on June 8 by Dr. Jena Gauss,  which showed geographic ulcerations of the distal 1/3 of the esophageus,  suggesting severe ulcerative esophagitis.  Hiatal hernia was noted.  Marked telemetry changes of the duodenum were also noted.  Biopsies were  sent, which showed evidence for  inflammation without any evidence for  malignancy.  She stayed from June 5 and was sent home on December 12, 2007.  Her other past medical history includes type 2 diabetes, hypertension,  depression, arthritis.   SOCIAL HISTORY:  She lives in [place] alone.  No family members are  here close by.  No smoking, alcohol, or illicit drug use.  Uses a cane  to ambulate.   FAMILY HISTORY:  Noncontributory.   REVIEW OF SYSTEMS:  Unremarkable, except as in HPI.   PHYSICAL EXAMINATION:  VITAL SIGNS:  When she was seen in the ED, she  was afebrile at 98.7, blood pressure 100/57.  When she came in, heart  rate 115, respiratory rate 24, saturation 98%.  Currently, on the floor,  her blood pressure is 104/69.  The rest of the vital signs are stable.  Heart rate is down to 89.  GENERAL:  This is an elderly black female in no distress.  She mentions  that she has not had any more nausea, vomiting, or diarrhea since she  has been up on the floor.  HEENT:  There is no pallor.  No icterus.  Oral mucous membranes moist.  No oral lesions are  noted.  NECK:  Soft and supple.  No thyromegaly is appreciated.  LUNGS:  Clear to auscultation anteriorly bilaterally.  CARDIOVASCULAR:  S1, S2 normal.  Regular.  No murmurs appreciated.  ABDOMEN:  Soft.  Tenderness in the epigastric area is elicited.  No  masses.  No rebound tenderness.  No organomegaly is present.  EXTREMITIES:  No edema.  Peripheral pulses palpable.  NEUROLOGIC:  She is alert and oriented x3.  No focal neurologic deficits  are present.   LABORATORY DATA:  Her CBC shows a white count of 8.7, hemoglobin 10.9,  platelet count 579,000.  Her sodium is 130, her potassium is 4.3,  chloride is 101, bicarb 18.  Glucose is 152.  LFTs show an AST of 65,  ALT of 104.  Lipase is 18.  UA shows specific gravity more than 1.030.  No infection was noted.  Some mild ketones were seen.   She had an acute abdominal series, which did not show any acute  abnormalities.   ASSESSMENT:  This is a 77 year old African American female who presents  with a 3 to 4 day history of diarrhea and vomiting.  She likely has  acute gastroenteritis.  She was recently found to have marked  inflammation in her upper GI tract, which could be contributing to some  of these symptoms.  Of note, she did have a CAT scan of her abdomen and  pelvis during her previous admission, which showed there are nonspecific  findings with nothing acute.  She underwent endoscopies as discussed  earlier.  She underwent a CT of her head yesterday in the ED when she  was here, which did not show any acute findings.   PLAN:  1. Acute gastroenteritis.  We will treat this symptomatically with      PPI, antidiarrheals as needed, antiemetics as needed.  Will send      stool studies if available.  2. Abnormal LFTs.  Etiology is unclear.  Will send acute hepatitis      panel.  Will recheck all of these levels tomorrow and if they      continue to be elevated, an ultrasound may be considered.  3. Diabetes type 2.  Will continue  with CBG q.a.c. and at bedtime.      Will  put her on sliding scale.  Hold off on the metformin for now      because of her mild acidosis.  4. Possible overuse of Tylenol.  The nurse told me that a caregiver      admitted that the patient had been taking a lot of Tylenol.  That      could also explain the elevated LFTs.  We will check a Tylenol      level, do a PT/INR.  5. Hypertension.  Stable.  The rest of her medical issues appear to be      stable.  DVT and GI prophylaxis will be initiated.   Further management decisions are dependent upon the results of other  testing and the patient's response to treatment.      Osvaldo Shipper, MD  Electronically Signed     GK/MEDQ  D:  02/14/2008  T:  02/14/2008  Job:  [id number]   cc:   Milus Mallick. Lodema Hong, M.D.  Fax: [phone]

## 2010-11-11 NOTE — Consult Note (Signed)
NAME:  [name], [name]                  ACCOUNT NO.:  [id number]   MEDICAL RECORD NO.:  [id number]          PATIENT TYPE:  INP   LOCATION:  A339                          FACILITY:  [facility]   PHYSICIAN:  R. Roetta Sessions, M.D. DATE OF BIRTH:  1931/07/01   DATE OF CONSULTATION:  12/04/2007  DATE OF DISCHARGE:                                 CONSULTATION   REQUESTING SERVICE:  [facility].   REASON FOR CONSULTATION:  Anorexia, nausea, vomiting and diarrhea.   HISTORY OF PRESENT ILLNESS:  Ms. [name] Alred. [name] is a pleasant 77 year old  African American female admitted to the hospital on December 02, 2007 with a  several-week history of progressive anorexia, some vague symptoms of  early satiety and burning in her esophagus when she swallows.  She has  some vague postprandial upper abdominal pain as well.  She tells me she  has lost 6-10 pounds over the past couple of weeks.  She is followed  primarily by Dr. Lodema Hong.  I note she has had a handful of the emergency  department visits over the past month or so with weakness and inability  to eat.  She had surgery by Dr. Romeo Apple a month ago for a right  olecranon fracture and apparently reinjured this fractures and is going  to have to have a surgical procedure again the near future.  She has  been on Cipro and nonsteroidal agents.  She was admitted basically with  the above-mentioned symptoms and mild hyperkalemia which was treated  with Kayexalate.  She underwent a non IV contrast CT of the abdomen and  pelvis (which I have reviewed with Dr. Allyson Sabal).  She does have a very  chunky abnormal-appearing distal esophagus as well as a thickened  proximal duodenum on the head of the pancreas with stranding and ill-  defined fat planes suggestive of enteritis or focal pancreatitis.  Of  note, her amylase and lipase are normal.   She has a very small contracted gallbladder, gallstone disease not  excluded on the CT scan.  She saw Dr. Lovell Sheehan a few weeks ago  and had a  screening colonoscopy, which is reported be normal, but we do not have  documentation of that at this time.   She saw Dr. Dietrich Pates back at the first this year with weakness and  shortness of breath.  She is felt have poor exercise tolerance.  She had  an echo which showed borderline LVH and a normal LVEF.   She has not had any hematemesis, melena or hematochezia.  She has said  she has had some loose, nonbloody brown bowel movements.  She is most  concerned about her difficulties with pain on swallowing and early  satiety and upper abdominal pain. [name] denies any history of  gastrointestinal illness.  There is no family history of GI neoplasia.  She does not use tobacco or alcohol.   PAST MEDICAL HISTORY:  Significant for:  1. Diabetes.  2. Hypertension.  3. Depression.  4. Osteoarthritis.   HOME MEDICATIONS:  1. Glipizide 4 mg daily.  2. Metformin  1 gram b.i.d.  3. Fluoxetine 20 mg twice daily.  4. Mobic 15 mg daily.  5. Enablex 7.5 mg daily.  6. Calcium supplement daily.  7. Lisinopril 20 mg daily.  8. Promethazine 25 mg p.r.n.  9. Tylenol p.r.n.  10.Hydrocodone/acetaminophen 7.5/650.  11.Seroquel 50 mg at bedtime.  12.Ciprofloxacin 500 mg b.i.d.  13.Zofran and Lomotil.   FAMILY HISTORY:  Negative for chronic GI or liver illness.   SOCIAL HISTORY:  She lives in [place] by herself.  She typically uses  a cane to ambulate.  No alcohol or illicit drugs.   PHYSICAL EXAMINATION:  GENERAL:  A pleasant 77 year old lady, resting  comfortably.  She has her right upper extremity in a splint and there is  blood is some blood-stained dressing in place.  She appears in no acute  distress.  VITAL SIGNS:  Temperature 98.5, pulse 106, respiratory rate 20, BP  150/82.  SKIN:  Warm and dry.  HEENT:  No scleral icterus.  Conjunctivae are pink.  Oral cavity:  No  lesions.  CHEST:  Lungs are clear to auscultation.  CARDIAC:  Regular rate and rhythm without murmur,  gallop or rub.  ABDOMEN:  Nondistended.  Positive bowel sounds.  No bruits.  She has  vague epigastric tenderness to deep palpation.  I do not appreciate a  mass or any organomegaly.  EXTREMITIES:  No edema.   LABORATORY DATA:  Potassium was elevated at 5.9 on admission, treated  with Kayexalate.  Today, potassium is 4.0, sodium 130, chloride 98, CO2  27, glucose 182, BUN 5, creatinine 0.77.  Hemoglobin A1c 6.2.  CBC:  White count 11.4, H&H 10.8 and 31.7, MCV 91.6 and platelet count  519,000; that was on June 6.  On June 7, hemoglobin 9.5, hematocrit  27.8, platelet count 503,000, white count 8.8.  It is notable on going  back to May 18, CBC revealed a white count of 5.0, H&H 11.1 and 32.7,  platelet count 402,000 at that time.  Alkaline phosphatase 104,  bilirubin 0.7, AST 24, ALT 19, albumin 27.   IMPRESSION:  Ms. [name] is a pleasant 77 year old lady who has had a  several-week history of weight loss in the setting of vague anorexia,  early satiety, postprandial upper abdominal pain along with vague  odynophagia and dysphagia.  She has had some diarrhea, but that does not  appear to be a major symptom at this time.   She is anemic and has an elevated platelet count (latter likely acute-  phase reactant).   Her CT is impressively abnormal.  I agree with Dr. Allyson Sabal that her distal  esophagus is markedly thickened and is likely producing some her for  upper gastrointestinal tract symptoms.   She also has an inflammatory process of the proximal duodenum involving  the head of the pancreas, but I doubt this is primarily focal  pancreatitis at this point in time; her amylase and lipase are notably  normal.   She may have at least 2 different processes ongoing.  She has a very  small contracted gallbladder and it is difficult to assess for stones  based on the CT findings.   Nothing obvious to suggest ischemia, however, CT scan was limited, as no  intravenous contrast was given.   Peptic ulcer disease does also remain  in the differential (duodenal ulcer disease).   RECOMMENDATIONS:  I agree with Dr. Deirdre Priest this lady does need to  have a diagnostic EGD and I have discussed this  approach with Ms. Rotenberry.  Risks, benefits and alternatives have been reviewed.  We will plan to  perform a diagnostic EGD on December 05, 2007.  I am going to add acid  suppression therapy in the way of IV Protonix to her regimen.  2..  She may end up needing a pancreatic protocol CT at some point, but  we will assess her luminal upper GI tract initially and then make  further recommendations.   I would like to thank Dr. Deirdre Priest for allowing me to see this nice  lady today.      Jonathon Bellows, M.D.  Electronically Signed     RMR/MEDQ  D:  12/04/2007  T:  12/04/2007  Job:  [id number]   cc:   Gerrit Friends. Dietrich Pates, MD, [facility]  [address]  [place], [place] [address]   Milus Mallick. Lodema Hong, M.D.  Fax: [phone]   Hospitalist Team

## 2010-11-11 NOTE — Discharge Summary (Signed)
NAME:  [name], [name]                  ACCOUNT NO.:  [id number]   MEDICAL RECORD NO.:  [id number]          PATIENT TYPE:  INP   LOCATION:  A302                          FACILITY:  [facility]   PHYSICIAN:  Dorris Singh, DO    DATE OF BIRTH:  1931/11/10   DATE OF ADMISSION:  02/14/2008  DATE OF DISCHARGE:  08/22/2009LH                               DISCHARGE SUMMARY   PRIMARY CARE PHYSICIAN:  Dr. Lodema Hong.   ADMISSION DIAGNOSES:  Acute gastroenteritis, mild metabolic acidosis,  dehydration, type 2 diabetes, abnormal LFTs unclear etiology and  hypertension.   DISCHARGE DIAGNOSES:  1. Hypokalemia.  2. Anemia.  3. Acute gastroenteritis which is resolved.  4. Mild metabolic acidosis which is resolved.  5. Dehydration which is resolved.  6. History of type 2 diabetes.  7. History of hypertension.  8. Elevated LFTs which is resolved as well.   STUDIES:  Her testing that was done while she was here includes a CT of  the head on August 17 which showed atrophy with small vessel chronic  ischemic changes of deep cerebral white matter, no acute intracranial  abnormalities.  She had an acute abdominal series done on August 18  which demonstrated no acute abnormality.  Her H&P was done by Dr. Osvaldo Shipper.  Please refer.  To summarize, the patient is a 77 year old  African female who presented to the emergency room with a several day  history of acute nausea and vomiting.  She was admitted for observation.  While she was here originally it was found that, she was recently found  to have marked inflammation of her upper GI tract.  She was found to  have erosive esophagitis back in June.  She also was scheduled for, also  had several biopsies done.  While she was there in the ED as well they  did a CT of the head with the findings as above.  For her acute  gastroenteritis she was treated with PPIs, antidiarrheals as well as  fluid resuscitation and antiemetics.  Her LFTs were followed.  We went  ahead and tested her for hepatitis.  These were negative but continued  to monitor them as they trended down until they were absolutely normal.  Also anemia.  While she was here she did start to have a decreasing H&H.  Her H&H decreased to lowest point of 8.7.  originally her initial, on  admission her initial H&H was 8.7.  We continued to watch her H&H as it  continued to trend down.  At that point in time it was determined we  would order Hemoccult stools as well as transfuse her.  GI was also  consulted on the case.  We continued to monitor her and repeated her CT  per GI's recommendations and the patient continued to improve.  On  August 22 it was determined that the patient could be discharged to  home.   SPECIFIC INSTRUCTIONS:  1. From GI is that she is to have a followup CT which was done today      and this was not a  CT of emergent nature, it was a followup from      one previously and they will review that with her.  2. Also they would like to see her back in 2 weeks.  She does need a      colonoscopy as well.   DISCHARGE MEDICATIONS:  Her medications she will be sent home on will  be:  1. Protonix 40 mg one p.o. daily.  That is a new medication.  2. Lisinopril 20 mEq one p.o. daily.  3. Tylenol 500 mg when needed.  4. Coricidin to take as directed.  5. Antidiarrheal over-the-counter medication take as directed when      needed.  6. She is also on Lomotil max 8 mg daily one tab after each stool.  7. Amaryl 40 mg p.o. daily.  8. Enablex 7.5 mg one p.o. daily.  9. Prozac 20 mEq two caps p.o. daily.  10.Zofran 4 mg every 6-8 hours as needed.  11.Seroquel 50 mg at bedtime.  12.Calcium shell with vitamin D 3 times a day.  13.Metformin 1000 mg p.o. b.i.d.  14.Phenergan 25 mg 1/2 tablet 4-6 hours as needed.  15.KCl 10 mEq p.o. daily #15.   DISCHARGE INSTRUCTIONS:  Her instructions are to do a low-sodium heart-  healthy diet, increase activity slowly.  Follow up with Dr. Cira Servant  in 2  weeks as mentioned above and also with her PCP in 1 week.   PHYSICAL EXAM:  Today the patient was seen.  Her vital signs were  stable.  Heart was regular rate and rhythm.  Lungs were clear to  auscultation bilaterally.  Abdomen soft, nontender and nondistended.  Her current white count for today is 3.8, hemoglobin 11.2, hematocrit  34.5, platelet count 404.  Her sodium is 136, potassium 3.2, chloride  99, CO2 30, glucose 131, BUN 3, creatinine 0.62.      Dorris Singh, DO  Electronically Signed     CB/MEDQ  D:  02/18/2008  T:  02/18/2008  Job:  [id number]   cc:   Milus Mallick. Lodema Hong, M.D.  Fax: [phone]

## 2010-11-11 NOTE — Group Therapy Note (Signed)
NAME:  JOEY, HUDOCK                  ACCOUNT NO.:  [id number]   MEDICAL RECORD NO.:  [id number]          PATIENT TYPE:  INP   LOCATION:  A339                          FACILITY:  [facility]   PHYSICIAN:  Vickki Hearing, M.D.DATE OF BIRTH:  Aug 27, 1931   DATE OF PROCEDURE:  DATE OF DISCHARGE:                                 PROGRESS NOTE   She is postop day #3, status post removal of hardware from right elbow  secondary to post-traumatic disruption of hardware, sinus tract  excision, advancement of triceps and application of posterior splint.  She is in stable condition from the orthopedic standpoint at this point.  She will need a brace locked 0-30 degrees and can be released from  orthopedic standpoint at any point.  I do need to follow her  postoperatively for wound check, postop day 13, and monitor her for  fixation of her for elbow function.      Vickki Hearing, M.D.  Electronically Signed     SEH/MEDQ  D:  12/12/2007  T:  12/12/2007  Job:  [id number]

## 2010-11-11 NOTE — Group Therapy Note (Signed)
NAMEMAGDALINE, [name]                  ACCOUNT NO.:  [id number]   MEDICAL RECORD NO.:  [id number]          PATIENT TYPE:  INP   LOCATION:  A339                          FACILITY:  [facility]   PHYSICIAN:  Lucita Ferrara, MD         DATE OF BIRTH:  10-26-1931   DATE OF PROCEDURE:  12/11/2007  DATE OF DISCHARGE:  11/19/2007                                 PROGRESS NOTE    Patient examined by bedside.  She has no specific complaints today.   PHYSICAL EXAMINATION:  VITAL SIGNS:  Temperature 100.8, pulse 119,  respirations 20, blood pressure 120/84.  CBG is 141/152/161.  HEENT:  Normocephalic, atraumatic.  CARDIOVASCULAR:  S, S2, tachy.  LUNGS:  Clear to auscultation.  EXTREMITIES:  No clubbing, cyanosis, or edema.   LABORATORY DATA:  Stool studies negative to date.  Gastrin level  elevated at 343.  CBC on December 09, 2007 shows anemia, normocytic.   ASSESSMENT AND PLAN:  Patient admitted back on December 02, 2007 with acute  diarrhea symptoms, consistent with a gastroenteritis, hyperkalemia an  abnormal CT showing thickening of the esophageal mucosa, and focal  pancreatitis of the head of the pancreas.  Consultation was made with  Dr. Jena Gauss of gastroenterology, who was alerted to a possibility of  carcinoma.  The patient underwent an EGD procedure on December 05, 2007 which  showed marked geographic ulceration, hiatal hernia, and 1 cm submucosal  distal greater curvature nodule.  Biopsies from that date showed  inflamed duodenal mucosa.  Workup for diarrhea including stool studies  and O&P were essentially negative.  The only result that came back  positive was her gastrin level.  She then finally underwent her  orthopedic procedure on December 09, 2007.  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  I do  believe that she would  probably be able to go home in the next day or  two, most likely tomorrow.  She is clinically stable.  All of her labs  are stable, and at baseline health.      Lucita Ferrara, MD  Electronically Signed     RR/MEDQ  D:  12/11/2007  T:  12/11/2007  Job:  [id number]

## 2010-11-14 NOTE — Consult Note (Signed)
[facility]  Patient:    [name], [name] Visit Number: [id number] MRN: [id number]          Service Type: MED Location: 3A A330 01 Attending Physician:  Rosalyn Charters Dictated by:   Mirna Mires, M.D. Proc. 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  The patient was admitted by Dr. Fuller Canada, orthopedics, for treatment of acute right high fracture on April 13, 2001.  The patient stated that the fracture occurred secondary to fall at home.  The right hip was painful to weightbearing.  She denied swelling and discoloration of her right leg.  PAST MEDICAL HISTORY:  Medical history is positive for type 2 diabetes mellitus, hypertension, and chronic ethanol use.  Medical history is negative for tuberculosis, cancer, sickle cell, asthma, or seizure disorder.  PRESCRIBED MEDICATIONS:  1. Halcion 0.25 mg 2 tablets p.o. every day at bedtime.  2. Maxzide 25 mg 1 tablet p.o. every day.  3. Celexa 20 mg 2 tablets p.o. every day.  4. Zyrtec 10 mg 1 tablet p.o. every day.  5. Wellbutrin SR 100 mg 1 tablet p.o. every day.  6. Antivert 12.5 mg 1 tablet p.o. q.8h.  7. Prilosec 20 mg 1 tablet p.o. every day.  8. Lotensin 40 mg 1 tablet p.o. b.i.d.  9. Micronase 5 mg 1 tablet p.o. b.i.d. 10. Deltasone 2.5 mg 1 tablet p.o. every day.  ALLERGIES:  PENICILLIN.  FAMILY HISTORY:  Mother deceased at age 30 secondary to pancreatic cancer; father deceased, cause unknown.  Three brothers are living: age 45 in good health, age 71 in good health, and another brother, age unknown.  One brother is deceased cause unknown; one sister is living at age 16 in good health; one son is living at age 57 in good health.  PAST MEDICAL HISTORY:  Last menstrual period at age 82.  History is  positive for hospitalization for weakness secondary to severe hyponatremia secondary to complication of acute gastroenteritis in September 2002. Hospitalization in the 1950s for thermal injury to right leg; hospitalization for umbilical hernia repair for 13 months at [facility] in [place], [place].  The patient denies use of ethanol since last hospitalization in September 2002.  HABITS:  Positive for chronic ethanol use in the past (two six-packs of beer per week), started at age 47.  Positive for use of tobacco (chewing) since age 81.  She denied use of street drugs.  REVIEW OF SYSTEMS:  Negative for bleeding gums, diarrhea, recent nausea and vomiting, epistaxis, hematemesis, gross hematuria, dysuria, melena, constipation, weight loss, chronic cough, shortness of breath, chest pain, and palpitations.  Review of Systems is positive for weakness over several days and dizziness secondary to standing.  PHYSICAL EXAMINATION:  VITAL SIGNS:  Temperature 99.9, pulse 103, respirations 20, blood pressure 158/80.  GENERAL:  An elderly lady of medium frame, medium height, alert black female in no apparent respiratory distress.  HEENT:  Forehead positive for abrasion about the diameter of a quarter.  Head examined and negative for laceration or nodule.  Ears: Normal auricle, external canal patent, tympanic membranes pearly grey.  Eyes: Negative ptosis. Sclerae white.  Pupils are round, equal, and reactive to  light.  Extraocular movements intact.  Nose: Negative for discharge.  Mouth: No teeth.  No bleeding gums.  No laceration of tongue.  Posterior pharynx benign.  NECK:  Negative for lymphadenopathy or thyromegaly.  LUNGS: Clear.  HEART:  Audible S1 and S2 without murmur.  Rate 100 and regular.  BREASTS:  No skin changes.  No nodule to palpation.  Nipple erect without discharge.  Left breast positive for old, healed wound scar.  ABDOMEN:  Slightly obese and  positive for old, healed mid gastric scar. Absent umbilicus.  Hyperactive bowel sounds.  Soft, positive mild mid epigastric tenderness.  No palpable mass, no organomegaly.  PELVIC:  Deferred.  RECTAL:  Deferred.  MUSCULOSKELETAL:  Right hip positive soreness on palpation at the lateral aspect.  No discoloration, no cyanosis.  VASCULAR:  Palpable right femoral artery.  Palpable right dorsalis pedis. Palpable left femoral artery.  Nonpalpable left dorsalis pedis.  NEUROLOGIC:  Alert and oriented to person, place, and time.  Cranial nerves II-XII appeared intact.  LABORATORY DATA:  INR 1.3, prothrombin time 14.2.  Urinalysis: Specific gravity 1.025, pH 5.0, no glucose, small amount of hemoglobin, no bilirubin, no ketones, trace protein, nitrate negative, leukocyte esterase negative. Thromboplastin time 29.  Alcohol level 6 mg/dl (normal 0 to 10).  Lipase 23 U/L, amylase 90 U/L.  Sodium 117, potassium 5.1, chloride 88, CO2 19, glucose 178, BUN 23, creatinine 1.2, total bilirubin 1.1, alkaline phosphatase 79, SGOT 21, SGPT 25, total protein 7.0, albumin 4.1, calcium 9.6. Total CPK 133, CK-MB 1.8, troponin I 0.01.  X-ray of right hip showed displaced right femoral neck fracture as read by the radiologist.  Chest x-ray was read as no acute abnormality by Dr. Ulyses Southward.  X-ray of right knee showed degenerative changes and osteoporosis without acute abnormalities, read by Dr. Ulyses Southward.  IMPRESSION: 1. Primary acute right hip pain secondary to displaced right femoral neck    fracture. 2. Hyponatremia.  SECONDARY DIAGNOSES: 1. Hypertension. 2. Type 2 diabetes mellitus. 3. Degenerative joint disease. 4. Osteoporosis. 5. History of chronic ethanol use.  PLAN:  Change fluid to normal saline at 150 cc per hour.  Accu-Chek a.c. and bedtime.  Hemoglobin A1C, magnesium level.  One multivitamins tablet p.o. eery day.  Lotensin 40 mg p.o. every day.  Prilosec 20 mg tablet p.o. every  day.  Celexa 20 mg 2 tablets p.o. q.d.  Librium 10 mg p.o. q.8h.  Repeat Chem-7 every 24 hours.  Diet is low salt 1600 ADA, calcium 500 mg p.o. t.i.d.  Stool softener p.o. every day and also Humalog sliding scale. Dictated by:   Mirna Mires, M.D. Attending Physician:  Rosalyn Charters DD:  04/14/01 TD:  04/14/01 Job: [id number]

## 2010-11-14 NOTE — Op Note (Signed)
[facility]  Patient:    [name], [name] Visit Number: [id number] MRN: [id number]          Service Type: MED Location: 3A A330 01 Attending Physician:  Rosalyn Charters Dictated by:   Fuller Canada, M.D. Admit Date:  04/13/2001                             Operative Report  PREOPERATIVE DIAGNOSIS:  Displaced fracture, right femoral neck (hip).  POSTOPERATIVE DIAGNOSIS:  Displaced fracture, right femoral neck (hip).  PROCEDURE:  Press-Fit [employer] bipolar partial hip replacement, stem size 9, neck size 0, head size 50 mm.  SURGEON:  Fuller Canada, M.D.  ASSISTANTS:  None.  ANESTHETIST:  Marylene Buerger, C.R.N.A.  HEMOVAC:  One.  ESTIMATED BLOOD LOSS:  200 cc.  FLUID:  Sodium chloride, normal saline 2 L, Hespan 500 cc.  COUNTS:  Sponge and needle count correct.  DESCRIPTION OF PROCEDURE:  Ms. [name] is 77 years old.  She fell and fractured her right hip.  She had a low sodium level of 117; this was corrected after medical consult using sodium chloride and fluid restriction.  She presented for surgery.  She had spinal anesthetic.  The right leg was then prepped and draped in a sterile fashion with the patient in lateral decubitus position. After a sterile draping technique, a 5- to 6-inch incision was made, centered over the greater trochanter.  The subcutaneous tissue was divided.  The fascia was split in line with the skin incision.  The anterior two-thirds of the abductor musculature was subperiosteally reflected from the greater trochanter, exposing the hip capsule.  A capsulectomy was performed.  A provisional femoral neck cut was done and the head was removed and measured. The acetabulum was clean and inspected and found to be free of any significant arthritic change.  The hip was dislocated anteriorly.  A curette was used to find the femoral canal.  A trochanteric reamer then followed.  Serial cylindrical reamings were performed up to a  Press-Fit 9; 7, 8 and 9 Press-Fit broaches were used to prepare the canal.  A trial reduction was performed using a 0 neck and 50-mm head.  All parameters for stability tested very well. Patient had 120 degrees of hip flexion.  At 90 degrees of flexion with 10 degrees of adduction, she had 60 to 70 degrees of internal rotation without dislocation.  With the hip in 5 degrees of extension, she had 50 degrees of external rotation without dislocation.  She had normal knee flexion.  The trial prostheses were then removed, the hip was irrigated, the acetabulum was inspected one final time and cleaned of debris.  The stem was placed, followed by the neck and head.  The hip was reduced.  Repeat manipulation of the hip confirmed all of the previously mentioned motion parameters.  The hip was then closed using #5 Tycron sutures through the greater trochanter to repair the abductors, #1 Bralon to repair the fascia, 0 Vicryl to repair the deep fatty layer and 2-0 Vicryl in the subcu layer.  A Hemovac drain was placed, stapled in place.  Staples were used to close the skin.  Thirty cc of Sensorcaine were injected using 0.5%.  Sterile bandage was placed.  The patient was placed on the hospital bed and abduction pillow was placed.  An intraoperative hip film was taken x 2 to confirm prosthetic position; second film was taken due to poor technique  on the first film.  POSTOPERATIVE PLAN:  Protected weightbearing, i.e., use a walker for six weeks with full weightbearing, then progress to cane as tolerated, anterior hip precautions, clindamycin for antibiotics due to penicillin allergy, Lovenox for DVT prophylaxis.  Dr. Darreld Mclean will take over the case of the patient starting on Monday, October 21st. Dictated by:   Fuller Canada, M.D. Attending Physician:  Rosalyn Charters DD:  04/16/01 TD:  04/18/01 Job: [id number]

## 2010-11-14 NOTE — H&P (Signed)
   NAME:  [name], [name]                            ACCOUNT NO.:  [id number]   MEDICAL RECORD NO.:  [id number]                   PATIENT TYPE:  AMB   LOCATION:  DAY                                  FACILITY:  [facility]   PHYSICIAN:  Lazaro Arms, M.D.                DATE OF BIRTH:  [birth year]   DATE OF ADMISSION:  DATE OF DISCHARGE:                                HISTORY & PHYSICAL   HISTORY OF PRESENT ILLNESS:  [name] is a 77 year old African American female,  gravida 2, para 1, who was found to have a complex widened endometrium on  pelvic sonogram back in May.  She had an endometrial biopsy which was  basically inconclusive.  She had a repeat ultrasound which showed enlarged  mass in the uterus consistent probably with a submucosal myoma.  As a  result, she is admitted for hysteroscopy and evacuation of the endometrium  and evaluation of the mass.   PAST MEDICAL HISTORY:  1. Diabetes.  2. Hypertension.   MEDICATIONS:  Her medications are Altace, generic potassium, Zyprexa and  temazepam.  She also takes hydrochlorothiazide, famotidine and occasionally  Darvocet.   REVIEW OF SYSTEMS:  Review of systems is otherwise negative.   PHYSICAL EXAMINATION:  VITAL SIGNS:  Blood pressure in my office is 130/64.  Her weight is 184 pounds.  HEENT:  Unremarkable.  NECK:  Thyroid is normal.  LUNGS:  Lungs are clear.  HEART:  Heart is regular rhythm without murmur, regurgitation or gallop.  BREASTS:  Deferred.  ABDOMEN:  Abdomen is benign.  PELVIC:  Exam is normal.  EXTREMITIES:  Extremities are warm with no edema.   IMPRESSION:  1. 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  2. Inconclusive endometrial biopsy.   PLAN:  The patient is admitted for evaluation of the mass and removal.  She  understands the risks, benefits, indications and alternatives and will  proceed.     ___________________________________________                                         Lazaro Arms, M.D.   [name]  D:  05/09/2003  T:  05/10/2003  Job:  [id number]

## 2010-11-14 NOTE — Discharge Summary (Signed)
NAME:  [name], [name]                            ACCOUNT NO.:  [id number]   MEDICAL RECORD NO.:  [id number]                   PATIENT TYPE:  INP   LOCATION:  A209                                 FACILITY:  [facility]   PHYSICIAN:  Vania Rea, M.D.              DATE OF BIRTH:  1932/06/02   DATE OF ADMISSION:  03/12/2004  DATE OF DISCHARGE:                                 DISCHARGE SUMMARY   PRIMARY CARE PHYSICIAN:  Dr. [name].   CARDIOLOGIST:  Dr. Corinda Gubler.   DISCHARGE DIAGNOSES:  1.  Chest pain, myocardial infarction ruled out.  2.  History of gastroesophageal reflux disease.  3.  History of depression.  4.  Hypertension, controlled.  5.  Diabetes mellitus, well controlled.  6.  Intermittent claudication.   DISPOSITION:  Discharged to home.   DISCHARGE CONDITION:  Stable.   DISCHARGE MEDICATIONS:  1.  Imdur 30 mg daily (new).  2.  Lopressor 25 mg twice daily (new).  3.  Aciphex 20 mg twice daily (increased).  4.  Prozac 20 mg daily.  5.  Amaryl 80 mg daily.  6.  Glucophage 1,000 mg twice daily.  7.  Altace 10 mg daily.  8.  Avandia 4 mg twice daily.  9.  Zyprexa, Maxzide, K-Dur as per Dr. Lodema Hong.   HOSPITAL COURSE:  Please refer to history and physical dictated yesterday.  This is a 77 year old African-American lady who had completely negative  Cardiolite exam 2 months ago for recurrent chest pain. The patient had  another episode of chest pain the night prior to admission. Came to the  emergency room. Was started on nitroglycerin drip and chest pain was  eventually relieved. The patient was admitted and had 3 sets of cardiac  enzymes over the next 18 hours which were negative for any evidence of  cardiac ischemia. EKG was normal sinus rhythm throughout her hospital stay.  The patient is being discharged home with Imdur and Lopressor added to her  medical regimen, to be followed up by the cardiologist to consider whether  they wish to do a cardiac  catheterization. The patient was also given  increased doses of Protonix while in hospital, and once cardiac cause of her  pain has been ruled out, consideration may be given to GERD as the cause of  this chest pain.   The patient complained of pain in the calves when she walks about 2 blocks,  and patient is scheduled to have an ABI as an outpatient. The patient's  hemoglobin A1c was 6.2 on this admission. Her TSH was 0.98 which was normal.  Her iron studies were completely normal. The patient has no acute problems  requiring continued hospital admission at this time.   FOLLOW UP:  1.  Follow up with Dr. [name] for continued management of blood      pressure, diabetes, and depression.  2.  Follow up with  Dr. Corinda Gubler cardiology within 1 week for further      evaluation of her cardiac status.  3.  ABI in radiology within 1 week. The patient may need to be started on      medication for her claudication.     ___________________________________________                                         Vania Rea, M.D.   LC/MEDQ  D:  03/13/2004  T:  03/13/2004  Job:  [id number]

## 2010-11-14 NOTE — Discharge Summary (Signed)
[facility]  Patient:    [name], [name] Visit Number: [id number] MRN: [id number]          Service Type: ECR Location: 2SNF S270 01 Attending Physician:  Elliot Cousin Dictated by:   Candace Cruise, P.A.-C. Admit Date:  05/02/2001 Disc. Date: 05/02/01   CC:         [name], M.D.                           Discharge Summary  DISCHARGE DIAGNOSES: 1. Displaced subcapital fracture of the right hip. 2. Postoperative ileus. 3. Type 2 diabetes mellitus. 4. Hypertension. 5. Postoperative hypokalemia. 6. Preoperative hyponatremia. 7. Postoperative anemia. 8. Chronic ethanol use.  OPERATION:  Bipolar process right hip.  DISCHARGE DISPOSITION:  The patient is discharged to the skilled nursing facility at [facility]  She is to follow up in Dr. Jenetta Downer office on May 30, 2001 at 9:45 a.m. for evaluation of right hip fracture.  Prior to her follow-up visit, she is to have x-rays of her right hip on May 29, 2001.  AP view only, check for alignment and fixation of prosthesis while here at this facility.  DISCHARGE CONDITION:  Improved.  PROGNOSIS:  Fair.  DISCHARGE MEDICATIONS: 1. Lovenox 30 mg q.12 h. x2 weeks. 2. Dulcolax suppository 1 suppository daily per rectum as needed for    constipation. 3. Catapres-TTS-1 patch every 7 days for hypertension. 4. Pepcid 20 mg 1 tab p.o. q.12 h. 5. Reglan 10 mg a.c. and h.s. 6. Phenergan 25 mg q.4 h. p.r.n. for nausea. 7. Vicodin 5 mg 1 tab q.4 h. p.r.n. for pain. 8. Tylenol 2 tabs q.4 h. p.r.n. for temperature greater than 101. 9. Other medications per Dr. Lodema Hong.  BRIEF HISTORY AND PHYSICAL:  Please refer to the handwritten History and Physical in the patients chart per Dr. Romeo Apple.  Also please refer to the handwritten H&P per Dr. Lodema Hong.  HOSPITAL COURSE:  This patient was admitted to this institution on 04/14/01, when she fell and sustained a displaced subcapital fracture of  her right hip.  On admission the patient had hyponatremia.  This was felt secondary to alcoholism and other medical problems.  On admission, she had an elevated blood glucose which was 276.  The patient was discussed with Dr. Laurene Footman, as well as with Dr. Lodema Hong, her family physician.  It was felt best to correct the hyponatremia as well as her hyperglycemia.  This was done and she was taken to the operating room on _/__/02, where she underwent a bipolar prosthesis to the right hip for a subcapital fracture.  The patient was on nasal O2 chronically since hospitalized because of low O2. Postoperatively she developed confusion and her IV morphine was discontinued. She had a hemoglobin of 8.2.  The patient was transfused one unit of packed red blood cells without any problem.  She had complaints of shortness of breath and her nasal O2 was increased to 3 L per minute.  Chest x-rays were done and the patient was monitored closely by Dr. Elpidio Anis.  The patient vomited x2 on April 17, 2001.  Abdomen appeared distended.  An NG tube was placed and 400 cc of mean output was obtained.  The patient was given Phenergan 25 mg IV for the nausea.  On postoperative day #2, her blood sugar was 118, temperature was 99, pulse 100, respirations 16, blood pressure 157/87.  She appeared to be more stable.  The patient was alert, but disoriented to time and date.  She was seen by physical therapy postoperatively and made very slow progress with physical therapy.  At the time of discharge, however, she could get in-and-out of bed unassisted and was seen doing so.  She also was able to walk several feet with standby assistance with the walker.  The patients chest x-ray showed left lower lobe atelectasis versus infiltrate.  She continued to run a low sodium as she was placed on sodium restriction.  She was also hypertensive.  All her medical needs were covered by Dr. Lodema Hong, Dr. Katrinka Blazing, and Dr.  Loleta Chance.  On April 19, 2001 her temperature was 100.6.  Abdomen was less distended. Overall she appeared to be improving.  She had a hypokalemia of 2.9.  This was corrected with  KCl elixir which brought it up to 3.5.  Her sodium at this time was 129, it was up from 126.  Hemoglobin was 9.8 after a transfusion.  The patients condition gradually improved and on 04/19/01 her sodium was 131, potassium was 3.0, BUN was 7, creatinine was 0.7 as of 6 p.m. that night. They gradually weaned her off the NG tube and this was removed on October 40, 2002.  The patient was able to take clear liquids and advanced her diet to a regular diet without any difficulty.  Her blood glucose level came down to a more normal range.  Her hypertension was still difficult to control, but was improving with medication.  The patient did not appear to be any distress as noted on postop day #5.  The wound was inspected to the right hip.  It was clean.  There was no erythema or drainage noted.  On April 24, 2001, she was afebrile.  Neurovascular was intact to the right lower extremity.  Hemoglobin was 11.2 after 2 more units of packed red blood cells.  Other labs were within normal limits.  Staples were removed from the patients right hip on April 27, 2001.  Skin was well approximated, Steri-Strips applied.  There is no erythema or drainage noted.  On April 27, 2001 she was able to ambulate 40 feet with standby assistance. Her abdomen appeared to be less distended daily.  She had no complaints of nausea or vomiting.  She was able to have bowel movements without difficulty. On April 29, 2001 her sodium was 137, potassium was 3.4, bicarb of 22, chloride 106, creatinine 0.8.  Glucose was 111.  The patient was discontinued off her O2, did not appear to have any kind of respiratory distress, and has done well.   Her pO2 was checked and it was a level of 98%.  The patient is discharged to the [facility] here at  this hospital, today, afebrile and in satisfactory condition.  Rehabilitation potential for this patient is fair, secondary to alcoholism and poor general health.  Physical therapy is to continue gait training with a walker, weight bearing as tolerated to the right foot.  The patient is to have x-rays of her right hip on May 29, 2001 at the hospital to check alignment and fixation of the prosthesis. Dictated by:   Candace Cruise, P.A.-C. Attending Physician:  Elliot Cousin DD:  05/02/01 TD:  05/02/01 Job: [id number]

## 2010-11-14 NOTE — Discharge Summary (Signed)
[facility]  Patient:    [name], [name] Visit Number: [id number] MRN: [id number]          Service Type: ECR Location: 2SNF S270 01 Attending Physician:  [name] Dictated by:   [name], M.D. Admit Date:  05/02/2001 Discharge Date: 05/13/2001                             Discharge Summary  DISPOSITION:  [facility].  DISCHARGE DIAGNOSES: 1. Displaced subcapital fracture of the right hip. 2. History of type 2 diabetes. 3. Hypertension. 4. Chronic alcohol abuse. 5. Recent postoperative ileus. 6. Recent postoperative hypokalemia.  HISTORY OF PRESENT ILLNESS:  [name] is a 77 year old black female who was admitted on April 14, 2001, with a displaced subcapital fracture of her right hip.  The patient has a history of chronic alcohol abuse, and this is felt to be responsible for the acute hip fracture.  On admission the patient had hyponatremia, and this had to be corrected prior to her surgery, which was done by Dr. Hilda Lias several days postadmission.  The patients hospital course for surgery was complicated by ileus as well as hypokalemia, both of which eventually resolved and, at the time of her discharge, it was deemed in her best interest to have her placed in a skilled facility until she was ______ prior to discharging her back to her home situation.  As a result, she was admitted to the skilled-nursing facility on May 02, 2001, for aggressive physical therapy and the necessary care required prior to be discharged back to home.  PAST MEDICAL HISTORY: 1. Hypertension. 2. Depression and anxiety. 3. Seasonal allergies. 4. History of diabetes mellitus. 5. Insomnia.  ADMISSION MEDICATIONS: 1. Halcion 0.25 mg 2 daily. 2. Maxzide 25 mg daily. 3. Celexa 20 mg 2 daily. 4. Zyrtec 10 mg 1 daily. 5. Wellbutrin SR 100 mg daily.  6. Antivert 12.5 mg every eight hours as needed.  7. Prilosec 20 mg daily.  8.  Lotensin 40 mg twice daily.  9. Micronase 5 mg twice daily. 10. Deltasone 2.5 mg daily.  ALLERGIES:  States allergy to PENICILLIN.  SOCIAL HISTORY:  The patient is married and lives with her spouse.  Both of them have desperate need for psychosocial support at home.  She has a chronic history of alcohol use, two 6-packs of beer per week, which started at the age of 26.  Also, positive history of chewing tobacco use since the age of 38. She denies street-drug use.  DISCHARGE PHYSICAL EXAMINATION:  GENERAL:  Alert and oriented, in no cardiopulmonary distress.  VITAL SIGNS:  Stable.  HEENT AND NECK:  Neck is supple.  Extraocular movements are intact. Oropharynx moist.  No JVD.  CHEST:  Adequate air entry bilaterally.  No crackles or wheezing.  CARDIOVASCULAR:  Heart sounds 1 and 2.  No murmurs or S3.  ABDOMEN:  Soft and nontender.  No palpable organomegaly or masses.  EXTREMITIES:  Negative for edema.  HOSPITAL COURSE:  During her stay at the short-term facility, the patient has made great progress in terms of her ability to use the affected limb, and she is in an intensive program of rehabilitation.  She has remained clinically stable.  She has complaints of pain for which she was getting pain medication, however, has been noted to be associated with drowsing, so some of her sedating medications have been changed.  [name] is being discharged on the  following medications.  DISCHARGE MEDICATIONS:  1. Pepcid 20 mg every 12 hours.  2. Reglan 10 mg a.c. and h.s.  3. Halcion 0.25 mg 1 at bedtime.  4. Klonopin 0.5 mg 1 three times daily.  5. Calcium with vitamin D 500 mg three times daily.  6. Colace 100 mg 2 twice daily.  7. Choice dm 1 can three times daily.  8. MiraLax 17 g in 8 oz of water daily.  9. Dulcolax tablets 1 every four days as needed for constipation. 10. Tylenol extra-strength 1 tablet every six hours for pain or temperature of     101 or more. 11. Prozac 20  mg 1 daily. 12. Actonel 35 mg once weekly.  FOLLOW-UP:  She will be followed by her primary care physician, Dr. Lodema Hong, in [facility]. Dictated by:   [name], M.D. Attending Physician:  [name] DD:  05/13/01 TD:  05/13/01 Job: [id number]

## 2010-11-14 NOTE — H&P (Signed)
NAME:  [name], [name]                            ACCOUNT NO.:  [id number]   MEDICAL RECORD NO.:  [id number]                   PATIENT TYPE:  EMS   LOCATION:  ED                                   FACILITY:  [facility]   PHYSICIAN:  Vania Rea, M.D.              DATE OF BIRTH:  10-Apr-1932   DATE OF ADMISSION:  03/12/2004  DATE OF DISCHARGE:                                HISTORY & PHYSICAL   Primary care physician:  Milus Mallick. Lodema Hong, M.D.  Cardiologist:  [facility].   CHIEF COMPLAINT:  Chest pain for 10 hours.   HISTORY OF PRESENT ILLNESS:  This is a 77 year old African-American lady  with a past history of exertional chest pain, who had an incomplete  Cardiolite examination in June of this year for investigation of same but  because the result was incomplete, her coronary artery disease has not been  verified.  The patient has been at her baseline, which is dyspnea on  exertion at half a block, associated also with calf pain but no frank chest  pains and no sweating.  The patient also has baseline orthopnea.  The  patient had sudden onset of an 8/10 crushing substernal chest pain  associated with left arm pain last night at about 11 p.m. This was  associated with headache, dizziness, diaphoresis, and nausea.  The patient  tried to relieve these symptoms with Tylenol but it did not help.  Eventually the patient came to the emergency room this morning, where she  was started on a nitroglycerin drip and the pain has come down to a 0/10.  The patient denies fever or cough.   PAST MEDICAL HISTORY:  1.  Hypertension.  2.  Diabetes type 2.  3.  Hyperlipidemia.  4.  Depression since the loss of her son in March.  5.  GERD.   MEDICATIONS:  K-Dur, Maxzide, Prozac, Amaryl, Glucophage, Zyprexa, Altace,  Darvocet, and Aciphex.   ALLERGIES:  PENICILLIN.   SOCIAL HISTORY:  The patient lives alone.  She has been widowed for the past  three years.  She is a retired Neurosurgeon.  Chews  tobacco but denies smoking  tobacco or alcohol or illicit drug use.   FAMILY HISTORY:  Her mother died of pancreatic cancer at age 75.  Her  father's health status is unknown.  She has four brothers and one sister,  who as far as she knows are in good health, but she really does not know  their health status.  She has one son, age 75, who was in good health until  he was murdered in March of this year.   REVIEW OF SYSTEMS:  Significant for headache associated with chest pain, but  denies any eye problems.  Does have sinusitis.  Has been edentulous for some  time, and this interferes with her digestion.  Denies thyroid problems.  Denies fever, cough, or cold.  Denies any vomiting or diarrhea but is  constipated, which she relates to inability to chew her food properly.  Denies any hematuria or dysuria but when her sugar is out of control, she  does have a tendency for urinary frequency.  Denies joint pains but has calf  pains after walking half a block.  Denies any history of stroke, syncope, or  focal weakness.   PHYSICAL EXAMINATION:  GENERAL:  A very pleasant elderly African-American  lady sitting up in the stretcher in no obvious respiratory distress.  VITAL SIGNS:  Temperature is 98.8, pulse 92, blood pressure 144/61.  She is  saturating at 96% on 2 L.  HEENT:  Her pupils are round, equal, and reactive.  She seems to have  immature cataracts.  NECK:  There is a prominent carotid transmitted pulsations, but no jugular  venous distention is appreciated.  CARDIAC:  She is somewhat tachycardic, but no murmurs are appreciated.  CHEST:  Clear to auscultation bilaterally.  ABDOMEN:  Mildly obese, soft, and nontender.  EXTREMITIES:  Without edema.  She has 2+ pulses bilaterally and good  capillary refill despite the history of claudication.  CENTRAL NERVOUS SYSTEM:  Grossly intact.   LABORATORY DATA:  Her white count is 8.4, hemoglobin 10.8, hematocrit 32.4,  platelet count 430.  Her  sodium is low at 123, potassium 4.0, chloride 97,  CO2 22, glucose 148, BUN 24, creatinine 1.2, calcium 8.6.  She has had three  point of care cardiac markers, which are negative.  Chest x-ray shows stable  elevation of the left hemidiaphragm, no acute changes suggestive of edema or  any other acute abnormalities.  She also had a completed Cardiolite exam in  July of this year, which showed no evidence of ischemia and an ejection  fraction of 74%.  Her EKG shows sinus tachycardia with a rate of 111, left  atrial enlargement from leads II and V1, but no T-wave changes.   ASSESSMENT:  1.  Unstable angina in a lady with a negative Cardiolite exam in July of      this year.  2.  Hypertension, uncontrolled for diabetes, although fairly well-      controlled.  3.  Diabetes, manifest type 2.  4.  History of gastroesophageal reflux disease.   PLAN:  Will admit this lady to rule out acute myocardial infarction.  At the  same time we will be giving her treatment for GERD.  Will get a cardiology  evaluation, and they may wish to consider her a candidate for cardiac  catheterization.     ___________________________________________                                         Vania Rea, M.D.   LC/MEDQ  D:  03/12/2004  T:  03/12/2004  Job:  [id number]   cc:   Milus Mallick. Lodema Hong, M.D.  [address]  [place], [place] [address]  Fax: [phone]

## 2010-11-14 NOTE — Procedures (Signed)
NAMEKENNIYA, [name] NO.:  [id number]   MEDICAL RECORD NO.:  [id number]                  PATIENT TYPE:   LOCATION:                                       FACILITY:   PHYSICIAN:  Vida Roller, M.D.                DATE OF BIRTH:  1931-12-25   DATE OF PROCEDURE:  11/30/2003  DATE OF DISCHARGE:                                    STRESS TEST   INDICATION:  The patient is a 77 year old female with no known coronary  artery disease with atypical chest discomfort.  Cardiac risk factors include  diabetes, hyperlipidemia, hypertension, and tobacco abuse.   BASELINE DATA:  EKG revealed sinus rhythm at 94 beats per minute with  nonspecific abnormalities.  Blood pressure 128/76.   Adenosine 47 mg was infused over 4-minute protocol with Cardiolite started  at 3 minutes.  The patient reported a strange feeling which resolved in  recovery.  EKG revealed no arrhythmias and no ischemic changes.   Final images and results are pending MD review.     ________________________________________  ___________________________________________  Jae Dire, P.A. [facility]                      Vida Roller, M.D.   AB/MEDQ  D:  11/30/2003  T:  11/30/2003  Job:  [id number]

## 2010-11-14 NOTE — Op Note (Signed)
   NAME:  [name], [name]                            ACCOUNT NO.:  [id number]   MEDICAL RECORD NO.:  [id number]                   PATIENT TYPE:  AMB   LOCATION:  DAY                                  FACILITY:  [facility]   PHYSICIAN:  Lazaro Arms, M.D.                DATE OF BIRTH:  [birth year]   DATE OF PROCEDURE:  05/10/2003  DATE OF DISCHARGE:                                 OPERATIVE REPORT   PREOPERATIVE DIAGNOSIS:  Endometrial mass in a postmenopausal woman.   POSTOPERATIVE DIAGNOSIS:  Multiple endometrial polyps.   PROCEDURE:  Hysteroscopy using the holmium laser to ablate and excise  polyps.   SURGEON:  Lazaro Arms, M.D.   ANESTHESIA:  Spinal.   FINDINGS:  The patient had multiple polyps, one quite large which is the one  we saw on ultrasound.  The stalk was taken down with the laser.  The other  ones were ablated.  The rest of the endometrium was normal.   DESCRIPTION OF PROCEDURE:  The patient was taken to the operating room and  underwent a spinal anesthetic.  She was placed in the dorsal lithotomy  position and prepped and draped in the usual sterile fashion.  The bladder  was drained.   The cervix was grasped, and 0.5% Marcaine with 1:200,000 epinephrine was  placed as a paracervical block.  The cervix was dilated serially.  The  hysteroscope was introduced, and the above-noted findings were seen.  The  holmium laser was used, and the stalk of the polyp was taken down using the  laser.  The other two polyps were quite small and were just ablated.  The  polyp was then removed with Randall stone forceps.  I looked back in with  the hysteroscope, and all pedicles were hemostatic.   The patient tolerated the procedure well.  She was taken to the recovery  room in good and stable condition.  All counts were correct.      ___________________________________________                                            Lazaro Arms, M.D.   LHE/MEDQ  D:  05/10/2003  T:   05/10/2003  Job:  [id number]

## 2010-11-14 NOTE — Procedures (Signed)
NAME:  [name], [name]                            ACCOUNT NO.:  [id number]   MEDICAL RECORD NO.:  [id number]                   PATIENT TYPE:  OUT   LOCATION:  RAD                                  FACILITY:  [facility]   PHYSICIAN:  [place] Bing, M.D.               DATE OF BIRTH:  03/15/1932   DATE OF PROCEDURE:  11/30/2003  DATE OF DISCHARGE:                                    STRESS TEST   REFERRING PHYSICIAN:  Milus Mallick. Lodema Hong, M.D.   CLINICAL DATA:  A 77 year old woman with multiple cardiovascular risk  factors but no known coronary disease, presenting with chest pain.   1. Adenosine infusion resulted in malaise.  2. There was a moderate increase in heart rate with drug administration; no     significant change in blood pressure noted.  No arrhythmias occurred.  3. EKG:  Normal sinus rhythm; within normal limits.  No change during     adenosine infusion.  4. Cardiolite was infected at peak drug effect.  Unfortunately, the patient     refused to undergo imaging and left the hospital.   IMPRESSION:  Incomplete stress Cardiolite study with only  electrocardiographic information available.      ___________________________________________                                            [place] Bing, M.D.   RR/MEDQ  D:  12/06/2003  T:  12/06/2003  Job:  [id number]   cc:   Milus Mallick. Lodema Hong, M.D.  [address]  [place], [place] [address]  Fax: [phone]

## 2010-11-19 DIAGNOSIS — M19039 Primary osteoarthritis, unspecified wrist: Secondary | ICD-10-CM

## 2010-11-19 DIAGNOSIS — I10 Essential (primary) hypertension: Secondary | ICD-10-CM

## 2010-11-19 DIAGNOSIS — E119 Type 2 diabetes mellitus without complications: Secondary | ICD-10-CM

## 2010-11-19 DIAGNOSIS — R32 Unspecified urinary incontinence: Secondary | ICD-10-CM

## 2010-11-29 ENCOUNTER — Other Ambulatory Visit: Payer: Self-pay | Admitting: Family Medicine

## 2010-12-04 ENCOUNTER — Encounter: Payer: Self-pay | Admitting: Family Medicine

## 2010-12-08 ENCOUNTER — Ambulatory Visit (INDEPENDENT_AMBULATORY_CARE_PROVIDER_SITE_OTHER): Payer: Medicare HMO | Admitting: Family Medicine

## 2010-12-08 ENCOUNTER — Encounter: Payer: Self-pay | Admitting: Family Medicine

## 2010-12-08 VITALS — BP 142/80 | HR 118 | Resp 16 | Ht 64.5 in | Wt 201.8 lb

## 2010-12-08 DIAGNOSIS — G4700 Insomnia, unspecified: Secondary | ICD-10-CM

## 2010-12-08 DIAGNOSIS — R5383 Other fatigue: Secondary | ICD-10-CM

## 2010-12-08 DIAGNOSIS — I10 Essential (primary) hypertension: Secondary | ICD-10-CM

## 2010-12-08 DIAGNOSIS — E119 Type 2 diabetes mellitus without complications: Secondary | ICD-10-CM

## 2010-12-08 DIAGNOSIS — E785 Hyperlipidemia, unspecified: Secondary | ICD-10-CM

## 2010-12-08 DIAGNOSIS — R5381 Other malaise: Secondary | ICD-10-CM

## 2010-12-08 MED ORDER — AMLODIPINE BESYLATE 5 MG PO TABS: 5.0000 mg | ORAL_TABLET | Freq: Every day | ORAL | Status: DC

## 2010-12-08 MED ORDER — LOSARTAN POTASSIUM 50 MG PO TABS: 50.0000 mg | ORAL_TABLET | Freq: Every day | ORAL | Status: DC

## 2010-12-08 MED ORDER — LOVASTATIN 10 MG PO TABS: 10.0000 mg | ORAL_TABLET | Freq: Every day | ORAL | Status: DC

## 2010-12-08 MED ORDER — BENAZEPRIL HCL 20 MG PO TABS: 20.0000 mg | ORAL_TABLET | Freq: Every day | ORAL | Status: DC

## 2010-12-08 MED ORDER — TEMAZEPAM 30 MG PO CAPS: 30.0000 mg | ORAL_CAPSULE | Freq: Every evening | ORAL | Status: DC | PRN

## 2010-12-08 MED ORDER — GLIPIZIDE 10 MG PO TABS: 10.0000 mg | ORAL_TABLET | Freq: Two times a day (BID) | ORAL | Status: DC

## 2010-12-08 MED ORDER — DARIFENACIN HYDROBROMIDE ER 15 MG PO TB24: 15.0000 mg | ORAL_TABLET | Freq: Every day | ORAL | Status: DC

## 2010-12-08 NOTE — Patient Instructions (Addendum)
cPE in 3.5 months.  hbA1c and tSH today   Fasting in 3.5 months cMP, eGFR, hBA1C, lipid  You are being referred for eye exam  Blood presure is too high , and additional med is added  New med for incontinence

## 2010-12-18 NOTE — Assessment & Plan Note (Signed)
Check HBA1C to determine control

## 2010-12-18 NOTE — Assessment & Plan Note (Signed)
Controlled, no change in medication  

## 2010-12-18 NOTE — Progress Notes (Signed)
  Subjective:    Patient ID: [name], female    DOB: May 20, 1932, 77 y.o.   MRN: [id number]  HPI HYPERTENSION Disease Monitoring Blood pressure range-unknown Chest pain- no      Dyspnea- no Medications Compliance- good Lightheadedness- no   Edema- no   DIABETES Disease Monitoring Blood Sugar ranges-fastting ranges from 120 to 1`40 Polyuria- no New Visual problems- no Medications Compliance- good Hypoglycemic symptoms- no   HYPERLIPIDEMIA Disease Monitoring See symptoms for Hypertension Medications Compliance- good RUQ pain- no  Muscle aches- no    Review of Systems Denies recent fever or chills. Denies sinus pressure, nasal congestion, ear pain or sore throat. Denies chest congestion, productive cough or wheezing. Denies chest pains, palpitations, paroxysmal nocturnal dyspnea, orthopnea and leg swelling Denies abdominal pain, nausea, vomiting,diarrhea or constipation.  Denies rectal bleeding or change in bowel movement. Denies dysuria, frequency, hesitancy or incontinence.  Denies headaches, seizure, numbness, or tingling. Denies uncontrolled  depression, anxiety or insomnia. Denies skin break down or rash.        Objective:   Physical Exam Patient alert and oriented and in no Cardiopulmonary distress.  HEENT: No facial asymmetry, EOMI, no sinus tenderness, TM's clear, Oropharynx pink and moist.  Neck supple no adenopathy.  Chest: Clear to auscultation bilaterally.  CVS: S1, S2 no murmurs, no S3.  ABD: Soft non tender. Bowel sounds normal.  Ext: No edema  [id number]  ROM spine, shoulders, hips and knees.  Skin: Intact, no ulcerations or rash noted.  Psych: Good eye contact, normal affect. Memory intact not anxious or depressed appearing.  CNS: CN 2-12 intact, power, tone and sensation normal throughout.    Diabetic Foot Check:  Appearance - no lesions, ulcers or calluses Skin - no unusual pallor or redness Sensation - grossly intact to light  touch Monofilament testing -  Right - Great toe, medial, central, lateral ball and posterior foot decreased  Left - Great toe, medial, central, lateral ball and posterior foot decreased Pulses Left - Dorsalis Pedis and Posterior Tibia normal Right - Dorsalis Pedis and Posterior Tibia normal     Assessment & Plan:

## 2010-12-22 ENCOUNTER — Telehealth: Payer: Self-pay | Admitting: Family Medicine

## 2010-12-23 NOTE — Telephone Encounter (Signed)
Already faxed in to [facility] for her

## 2010-12-24 ENCOUNTER — Telehealth: Payer: Self-pay | Admitting: Family Medicine

## 2010-12-24 MED ORDER — DARIFENACIN HYDROBROMIDE ER 15 MG PO TB24: 15.0000 mg | ORAL_TABLET | Freq: Every day | ORAL | Status: DC

## 2010-12-24 NOTE — Telephone Encounter (Signed)
Please advise patient she needs a nurse visit for urine check

## 2010-12-24 NOTE — Telephone Encounter (Signed)
Med sent as requested 

## 2010-12-25 ENCOUNTER — Other Ambulatory Visit: Payer: Self-pay | Admitting: Family Medicine

## 2010-12-25 ENCOUNTER — Ambulatory Visit (INDEPENDENT_AMBULATORY_CARE_PROVIDER_SITE_OTHER): Payer: Medicare HMO | Admitting: Family Medicine

## 2010-12-25 DIAGNOSIS — N390 Urinary tract infection, site not specified: Secondary | ICD-10-CM

## 2010-12-25 DIAGNOSIS — M19039 Primary osteoarthritis, unspecified wrist: Secondary | ICD-10-CM

## 2010-12-25 DIAGNOSIS — R32 Unspecified urinary incontinence: Secondary | ICD-10-CM

## 2010-12-25 DIAGNOSIS — I10 Essential (primary) hypertension: Secondary | ICD-10-CM

## 2010-12-25 DIAGNOSIS — N3000 Acute cystitis without hematuria: Secondary | ICD-10-CM

## 2010-12-25 LAB — POCT URINALYSIS DIPSTICK
Protein, UA: 30
pH, UA: 5.5

## 2010-12-25 MED ORDER — CIPROFLOXACIN HCL 500 MG PO TABS: 500.0000 mg | ORAL_TABLET | Freq: Two times a day (BID) | ORAL | Status: AC

## 2010-12-25 NOTE — Progress Notes (Signed)
Urine positive for infection. Will send for culture. Allergic to PCN.

## 2010-12-29 MED ORDER — SULFAMETHOXAZOLE-TRIMETHOPRIM 800-160 MG PO TABS: 1.0000 | ORAL_TABLET | Freq: Two times a day (BID) | ORAL | Status: AC

## 2010-12-29 NOTE — Progress Notes (Signed)
Addended by: Adella Hare B on: 12/29/2010 10:08 AM   Modules accepted: Orders

## 2011-01-05 ENCOUNTER — Telehealth: Payer: Self-pay | Admitting: Family Medicine

## 2011-01-05 NOTE — Telephone Encounter (Signed)
Has an appt with specialist already. Dr Paulino Door

## 2011-01-12 ENCOUNTER — Telehealth: Payer: Self-pay | Admitting: Family Medicine

## 2011-01-12 ENCOUNTER — Encounter (HOSPITAL_COMMUNITY): Payer: Self-pay | Admitting: *Deleted

## 2011-01-12 ENCOUNTER — Telehealth: Payer: Self-pay | Admitting: *Deleted

## 2011-01-12 ENCOUNTER — Emergency Department (HOSPITAL_COMMUNITY)
Admission: EM | Admit: 2011-01-12 | Discharge: 2011-01-12 | Disposition: A | Discharge status: Home / Self Care | Payer: Medicare HMO | Attending: Emergency Medicine | Admitting: Emergency Medicine

## 2011-01-12 DIAGNOSIS — E119 Type 2 diabetes mellitus without complications: Secondary | ICD-10-CM | POA: Insufficient documentation

## 2011-01-12 DIAGNOSIS — R110 Nausea: Secondary | ICD-10-CM | POA: Insufficient documentation

## 2011-01-12 DIAGNOSIS — R748 Abnormal levels of other serum enzymes: Secondary | ICD-10-CM

## 2011-01-12 DIAGNOSIS — K859 Acute pancreatitis without necrosis or infection, unspecified: Secondary | ICD-10-CM

## 2011-01-12 DIAGNOSIS — F0390 Unspecified dementia without behavioral disturbance: Secondary | ICD-10-CM | POA: Insufficient documentation

## 2011-01-12 DIAGNOSIS — I10 Essential (primary) hypertension: Secondary | ICD-10-CM | POA: Insufficient documentation

## 2011-01-12 LAB — URINALYSIS, ROUTINE W REFLEX MICROSCOPIC
Hgb urine dipstick: NEGATIVE
Ketones, ur: NEGATIVE mg/dL
Nitrite: NEGATIVE
Specific Gravity, Urine: 1.025 (ref 1.005–1.030)
Urobilinogen, UA: 0.2 mg/dL (ref 0.0–1.0)

## 2011-01-12 LAB — CBC
Hemoglobin: 10.9 g/dL — ABNORMAL LOW (ref 12.0–15.0)
MCH: 25.8 pg — ABNORMAL LOW (ref 26.0–34.0)
Platelets: 377 10*3/uL (ref 150–400)
RBC: 4.23 MIL/uL (ref 3.87–5.11)
WBC: 6.6 10*3/uL (ref 4.0–10.5)

## 2011-01-12 LAB — COMPREHENSIVE METABOLIC PANEL
ALT: 9 U/L (ref 0–35)
Alkaline Phosphatase: 88 U/L (ref 39–117)
BUN: 7 mg/dL (ref 6–23)
CO2: 22 mEq/L (ref 19–32)
Chloride: 102 mEq/L (ref 96–112)
GFR calc Af Amer: >60 mL/min (ref >60)
Glucose, Bld: 161 mg/dL — ABNORMAL HIGH (ref 70–99)
Potassium: 4.1 mEq/L (ref 3.5–5.1)
Sodium: 134 mEq/L — ABNORMAL LOW (ref 135–145)
Total Bilirubin: 0.1 mg/dL — ABNORMAL LOW (ref 0.3–1.2)

## 2011-01-12 LAB — DIFFERENTIAL
Eosinophils Absolute: 0.2 10*3/uL (ref 0.0–0.7)
Lymphocytes Relative: 22 % (ref 12–46)
Lymphs Abs: 1.4 10*3/uL (ref 0.7–4.0)
Monocytes Relative: 6 % (ref 3–12)
Neutro Abs: 4.6 10*3/uL (ref 1.7–7.7)
Neutrophils Relative %: 70 % (ref 43–77)

## 2011-01-12 LAB — GLUCOSE, CAPILLARY: Glucose-Capillary: 168 mg/dL — ABNORMAL HIGH (ref 70–99)

## 2011-01-12 LAB — URINE MICROSCOPIC-ADD ON

## 2011-01-12 LAB — LIPASE, BLOOD: Lipase: 80 U/L — ABNORMAL HIGH (ref 11–59)

## 2011-01-12 MED ORDER — ONDANSETRON 8 MG PO TBDP: 8.0000 mg | ORAL_TABLET | Freq: Three times a day (TID) | ORAL | Status: AC | PRN

## 2011-01-12 MED ORDER — SODIUM CHLORIDE 0.9 % IV BOLUS (SEPSIS)
500.0000 mL | Freq: Once | INTRAVENOUS | Status: AC
  Administered 2011-01-12: 1000 mL via INTRAVENOUS

## 2011-01-12 MED ORDER — SODIUM CHLORIDE 0.9 % IV SOLN
INTRAVENOUS | Status: DC
  Administered 2011-01-12: 11:00:00 via INTRAVENOUS

## 2011-01-12 MED ORDER — SODIUM CHLORIDE 0.9 % IJ SOLN
10.0000 mL | Freq: Once | INTRAMUSCULAR | Status: AC
  Administered 2011-01-12: 10 mL via INTRAVENOUS

## 2011-01-12 NOTE — ED Notes (Signed)
Pt was trying to get out of bed. Pt stated she was starving and had not eaten since last night. PA Burgess Amor made aware. PA stated she could have something to drink but no food at the moment. Pt was given a diet ginger-ale. Pt happy, NAD noted at this time.

## 2011-01-12 NOTE — Telephone Encounter (Signed)
Lab ordered per dr Lodema Hong request

## 2011-01-12 NOTE — ED Provider Notes (Signed)
History     Chief Complaint  Patient presents with  . Headache   Patient is a 77 y.o. female presenting with cramps. The history is provided by the patient.  Abdominal Cramping The primary symptoms of the illness include abdominal pain, fatigue, nausea and diarrhea. The primary symptoms of the illness do not include fever, shortness of breath, hematochezia, dysuria or vaginal discharge. The onset of the illness was gradual. The problem has been gradually improving.  The abdominal pain began yesterday. The pain came on gradually. The abdominal pain has been gradually improving since its onset. The abdominal pain is located in the periumbilical region (Describes intermittent cramping lasting a few minutes then resolving.  She had diarrhea yesterday,  reporting 3 loose stools with no diarrhea today,  no antidiarrheal meds taken.). The abdominal pain does not radiate. The severity of the abdominal pain is 2/10. The abdominal pain is relieved by nothing. Exacerbated by: nothing.  Nausea began yesterday. The nausea is associated with eating. The nausea is exacerbated by food.  Risk factors for an acute abdominal problem include being elderly (Lives alone with home health provider.  No sick contacts.).    Past Medical History  Diagnosis Date  . Diabetes mellitus   . Hypertension   . Reflux   . Dementia   . Right elbow pain     OTIF    Past Surgical History  Procedure Date  . Orif right hip 1999  . Umbilical hernia repair     Family History  Problem Relation Age of Onset  . Cancer Mother     pelvic     History  Substance Use Topics  . Smoking status: Never Smoker   . Smokeless tobacco: Current User    Types: Chew  . Alcohol Use: No     Hx of Alcohol dependecy     OB History    Grav Para Term Preterm Abortions TAB SAB Ect Mult Living                  Review of Systems  Constitutional: Positive for fatigue. Negative for fever.  HENT: Negative for congestion, sore throat  and neck pain.   Eyes: Negative.   Respiratory: Negative for cough, chest tightness and shortness of breath.   Cardiovascular: Negative for chest pain and leg swelling.  Gastrointestinal: Positive for nausea, abdominal pain and diarrhea. Negative for hematochezia.  Genitourinary: Negative.  Negative for dysuria, vaginal discharge and difficulty urinating.  Musculoskeletal: Negative for joint swelling and arthralgias.  Skin: Negative.  Negative for rash and wound.  Neurological: Positive for weakness. Negative for dizziness, light-headedness, numbness and headaches.  Hematological: Negative.   Psychiatric/Behavioral: Negative.     Physical Exam  BP 142/93  Pulse 89  Temp(Src) 99.1 F (37.3 C) (Oral)  Resp 16  Ht 5\' 6"  (1.676 m)  Wt 181 lb (82.101 kg)  BMI 29.21 kg/m2  SpO2 93%  Physical Exam  Vitals reviewed. Constitutional: She is oriented to person, place, and time. She appears well-developed and well-nourished.  HENT:  Head: Normocephalic and atraumatic.  Eyes: Conjunctivae are normal.  Neck: Normal range of motion.  Cardiovascular: Normal rate, regular rhythm, normal heart sounds and intact distal pulses.   Pulmonary/Chest: Effort normal and breath sounds normal. No respiratory distress. She has no wheezes.  Abdominal: Soft. Bowel sounds are normal. She exhibits no distension and no mass. There is no tenderness. There is no rebound and no guarding.  Musculoskeletal: Normal range of motion.  Neurological:  She is alert and oriented to person, place, and time.  Skin: Skin is warm and dry.  Psychiatric: She has a normal mood and affect.    ED Course  Procedures  MDM Patient discussed with Dr. Lodema Hong for outpatient follow up.  She has arranged for  A repeat lipase level in 2 days and office visit for 01/23/11 at 9:15 am      Candis Musa, [place] 01/12/11 1507

## 2011-01-12 NOTE — ED Notes (Signed)
"  Just don't feel good". Pt states legs are weak and hurting. Intermittent headache started yesterday "hurting a little" at present. Decreased appetite began yesterday. Intermittent abdominal pain began yesterday, no pain to abd at present. NAD. Pt dropped of by her aid.

## 2011-01-12 NOTE — Telephone Encounter (Signed)
PA or nurse practioner seeing pt called requesting rept lipase in 2 days, and office f/u in next 1 to 2 weeks based on symptoms. I stated clearly if there is a question as to safety for d/c home then certainly the supervising MD would be the one to decide. Test was ordered and faxed to lab, also appt made for office f/u next week.

## 2011-01-12 NOTE — ED Notes (Signed)
Mid abd pain with weakness, decreased appetite, headache, and cough that started yesterday.  Denies vomiting, states had diarrhea yesterday, but none today.  Last normal bm this morning.  MMM, abd soft, non-distended.  nad noted.

## 2011-01-15 LAB — LIPASE: Lipase: 61 U/L (ref 0–75)

## 2011-01-16 ENCOUNTER — Other Ambulatory Visit: Payer: Self-pay | Admitting: Family Medicine

## 2011-01-20 ENCOUNTER — Emergency Department (HOSPITAL_COMMUNITY)
Admission: EM | Admit: 2011-01-20 | Discharge: 2011-01-20 | Disposition: A | Discharge status: Home / Self Care | Payer: Medicare HMO | Attending: Emergency Medicine | Admitting: Emergency Medicine

## 2011-01-20 ENCOUNTER — Encounter (HOSPITAL_COMMUNITY): Payer: Self-pay | Admitting: *Deleted

## 2011-01-20 ENCOUNTER — Emergency Department (HOSPITAL_COMMUNITY): Payer: Medicare HMO

## 2011-01-20 DIAGNOSIS — K219 Gastro-esophageal reflux disease without esophagitis: Secondary | ICD-10-CM | POA: Insufficient documentation

## 2011-01-20 DIAGNOSIS — F0390 Unspecified dementia without behavioral disturbance: Secondary | ICD-10-CM | POA: Insufficient documentation

## 2011-01-20 DIAGNOSIS — R109 Unspecified abdominal pain: Secondary | ICD-10-CM | POA: Insufficient documentation

## 2011-01-20 DIAGNOSIS — E119 Type 2 diabetes mellitus without complications: Secondary | ICD-10-CM | POA: Insufficient documentation

## 2011-01-20 DIAGNOSIS — I10 Essential (primary) hypertension: Secondary | ICD-10-CM | POA: Insufficient documentation

## 2011-01-20 LAB — URINALYSIS, ROUTINE W REFLEX MICROSCOPIC
Glucose, UA: NEGATIVE mg/dL
Leukocytes, UA: NEGATIVE
Protein, ur: 100 mg/dL — AB
pH: 6 (ref 5.0–8.0)

## 2011-01-20 LAB — COMPREHENSIVE METABOLIC PANEL
ALT: 11 U/L (ref 0–35)
AST: 15 U/L (ref 0–37)
Alkaline Phosphatase: 83 U/L (ref 39–117)
CO2: 25 mEq/L (ref 19–32)
Chloride: 95 mEq/L — ABNORMAL LOW (ref 96–112)
GFR calc Af Amer: >60 mL/min (ref >60)
GFR calc non Af Amer: >60 mL/min (ref >60)
Glucose, Bld: 163 mg/dL — ABNORMAL HIGH (ref 70–99)
Sodium: 133 mEq/L — ABNORMAL LOW (ref 135–145)
Total Bilirubin: 0.2 mg/dL — ABNORMAL LOW (ref 0.3–1.2)

## 2011-01-20 LAB — CBC
HCT: 33.7 % — ABNORMAL LOW (ref 36.0–46.0)
Hemoglobin: 10.9 g/dL — ABNORMAL LOW (ref 12.0–15.0)
MCH: 25.5 pg — ABNORMAL LOW (ref 26.0–34.0)
MCHC: 32.3 g/dL (ref 30.0–36.0)
RBC: 4.27 MIL/uL (ref 3.87–5.11)

## 2011-01-20 MED ORDER — ONDANSETRON HCL 4 MG PO TABS: 4.0000 mg | ORAL_TABLET | Freq: Four times a day (QID) | ORAL | Status: AC

## 2011-01-20 MED ORDER — IOHEXOL 300 MG/ML  SOLN
100.0000 mL | Freq: Once | INTRAMUSCULAR | Status: AC | PRN
  Administered 2011-01-20: 100 mL via INTRAVENOUS

## 2011-01-20 MED ORDER — ONDANSETRON HCL 4 MG/2ML IJ SOLN
4.0000 mg | Freq: Once | INTRAMUSCULAR | Status: AC
  Administered 2011-01-20: 4 mg via INTRAVENOUS
  Filled 2011-01-20: qty 2

## 2011-01-20 MED ORDER — HYDROCODONE-ACETAMINOPHEN 5-500 MG PO TABS: 1.0000 | ORAL_TABLET | Freq: Four times a day (QID) | ORAL | Status: AC | PRN

## 2011-01-20 MED ORDER — MORPHINE SULFATE 4 MG/ML IJ SOLN
4.0000 mg | Freq: Once | INTRAMUSCULAR | Status: AC
  Administered 2011-01-20: 4 mg via INTRAVENOUS
  Filled 2011-01-20: qty 1

## 2011-01-20 MED ORDER — ACETAMINOPHEN 500 MG PO TABS
1000.0000 mg | ORAL_TABLET | Freq: Once | ORAL | Status: AC
  Administered 2011-01-20: 1000 mg via ORAL
  Filled 2011-01-20: qty 2

## 2011-01-20 NOTE — ED Notes (Signed)
Pt dressed, sitting in chair.  Waiting on ride to get here for pick-up.  Breakfast tray given while waiting.  nad noted.

## 2011-01-20 NOTE — ED Notes (Signed)
abd pain for 2-3 days, no nv, Headache.

## 2011-01-20 NOTE — ED Provider Notes (Signed)
History     Chief Complaint  Patient presents with  . Abdominal Pain   Patient is a 77 y.o. female presenting with abdominal pain. The history is provided by the patient.  Abdominal Pain The primary symptoms of the illness include abdominal pain. The primary symptoms of the illness do not include fever, shortness of breath, nausea, vomiting, diarrhea, hematemesis or dysuria. The current episode started 13 to 24 hours ago. The onset of the illness was gradual. The problem has not changed since onset. Associated with: nothing. The patient has not had a change in bowel habit. Additional symptoms associated with the illness include hematuria. Symptoms associated with the illness do not include chills, frequency or back pain.   Evaluated for similar symptoms here about a week ago and was instructed to f/u with her PCP. Has been doing well since that time.  Past Medical History  Diagnosis Date  . Diabetes mellitus   . Hypertension   . Reflux   . Dementia   . Right elbow pain     OTIF    Past Surgical History  Procedure Date  . Orif right hip 1999  . Umbilical hernia repair     Family History  Problem Relation Age of Onset  . Cancer Mother     pelvic     History  Substance Use Topics  . Smoking status: Never Smoker   . Smokeless tobacco: Current User    Types: Chew  . Alcohol Use: No     Hx of Alcohol dependecy     OB History    Grav Para Term Preterm Abortions TAB SAB Ect Mult Living                  Review of Systems  Constitutional: Negative for fever and chills.  HENT: Negative for neck pain.   Respiratory: Negative for shortness of breath.   Cardiovascular: Negative for chest pain.  Gastrointestinal: Positive for abdominal pain. Negative for nausea, vomiting, diarrhea and hematemesis.  Genitourinary: Positive for hematuria. Negative for dysuria, frequency and flank pain.  Musculoskeletal: Negative for back pain.  Neurological: Negative for syncope and  headaches.  Psychiatric/Behavioral: Negative for confusion.  All other systems reviewed and are negative.    Physical Exam  BP 156/82  Pulse 89  Temp(Src) 98.5 F (36.9 C) (Oral)  Resp 16  SpO2 93%  Physical Exam  Nursing note and vitals reviewed. Constitutional: She is oriented to person, place, and time. She appears well-developed and well-nourished.  HENT:  Head: Normocephalic and atraumatic.  Eyes: Conjunctivae and EOM are normal. Pupils are equal, round, and reactive to light.  Neck: Trachea normal. Neck supple. No thyromegaly present.  Cardiovascular: Normal rate, regular rhythm, S1 normal, S2 normal and normal pulses.     No systolic murmur is present   No diastolic murmur is present  Pulses:      Radial pulses are 2+ on the right side, and 2+ on the left side.  Pulmonary/Chest: Effort normal and breath sounds normal. She has no wheezes. She has no rhonchi. She has no rales. She exhibits no tenderness.  Abdominal: Soft. Normal appearance and bowel sounds are normal. There is tenderness in the periumbilical area. There is no rebound, no guarding, no CVA tenderness, no tenderness at McBurney's point and negative Murphy's sign.  Musculoskeletal:       BLE:s Calves nontender, no cords or erythema, negative Homans sign  Neurological: She is alert and oriented to person, place, and time. She  has normal strength. No cranial nerve deficit or sensory deficit. GCS eye subscore is 4. GCS verbal subscore is 5. GCS motor subscore is 6.  Skin: Skin is warm and dry. No rash noted. She is not diaphoretic.  Psychiatric: Her speech is normal.       Cooperative and appropriate    ED Course  Procedures  MDM Pain resolved with IV morphine x 1, CT scan obtained and reviewed with possible mild pancreatitis on CT and lipase WNL.   Recheck ABD exam is s/nt/nd and clinical condition is very much improved.  PT requesting to be discharged and to have something to eat. Plan clear liquids today and  f/u with PCP in am for recheck. PT states understanding strict return precautions and follow up instructions.   Ranson's score 1 at this time  Results for orders placed during the hospital encounter of 01/20/11  CBC      Component Value Range   WBC 7.8  4.0 - 10.5 (K/uL)   RBC 4.27  3.87 - 5.11 (MIL/uL)   Hemoglobin 10.9 (*) 12.0 - 15.0 (g/dL)   HCT 16.1 (*) 09.6 - 46.0 (%)   MCV 78.9  78.0 - 100.0 (fL)   MCH 25.5 (*) 26.0 - 34.0 (pg)   MCHC 32.3  30.0 - 36.0 (g/dL)   RDW 04.5 (*) 40.9 - 15.5 (%)   Platelets 321  150 - 400 (K/uL)  URINALYSIS, ROUTINE W REFLEX MICROSCOPIC      Component Value Range   Color, Urine YELLOW  YELLOW    Appearance CLEAR  CLEAR    Specific Gravity, Urine >1.030 (*) 1.005 - 1.030    pH 6.0  5.0 - 8.0    Glucose, UA NEGATIVE  NEGATIVE (mg/dL)   Hgb urine dipstick TRACE (*) NEGATIVE    Bilirubin Urine NEGATIVE  NEGATIVE    Ketones, ur NEGATIVE  NEGATIVE (mg/dL)   Protein, ur 811 (*) NEGATIVE (mg/dL)   Urobilinogen, UA 0.2  0.0 - 1.0 (mg/dL)   Nitrite NEGATIVE  NEGATIVE    Leukocytes, UA NEGATIVE  NEGATIVE   COMPREHENSIVE METABOLIC PANEL      Component Value Range   Sodium 133 (*) 135 - 145 (mEq/L)   Potassium 3.6  3.5 - 5.1 (mEq/L)   Chloride 95 (*) 96 - 112 (mEq/L)   CO2 25  19 - 32 (mEq/L)   Glucose, Bld 163 (*) 70 - 99 (mg/dL)   BUN 7  6 - 23 (mg/dL)   Creatinine, Ser 9.14  0.50 - 1.10 (mg/dL)   Calcium 9.6  8.4 - 78.2 (mg/dL)   Total Protein 7.5  6.0 - 8.3 (g/dL)   Albumin 3.7  3.5 - 5.2 (g/dL)   AST 15  0 - 37 (U/L)   ALT 11  0 - 35 (U/L)   Alkaline Phosphatase 83  39 - 117 (U/L)   Total Bilirubin 0.2 (*) 0.3 - 1.2 (mg/dL)   GFR calc non Af Amer >60  >60 (mL/min)   GFR calc Af Amer >60  >60 (mL/min)  LIPASE, BLOOD      Component Value Range   Lipase 52  11 - 59 (U/L)  URINE MICROSCOPIC-ADD ON      Component Value Range   Squamous Epithelial / LPF FEW (*) RARE    WBC, UA 0-2  <3 (WBC/hpf)   RBC / HPF 0-2  <3 (RBC/hpf)   Ct Abdomen  Pelvis W Contrast  01/20/2011  *RADIOLOGY REPORT*  Clinical Data: Abdominal pain, hernia  repair, right renal angiomyolipoma  CT ABDOMEN AND PELVIS WITH CONTRAST  Technique:  Multidetector CT imaging of the abdomen and pelvis was performed following the standard protocol during bolus administration of intravenous contrast.  Contrast: 100 ml Omnipaque-300 IV  Comparison: CT abdomen dated 02/18/2008  Findings: Minimal dependent atelectasis lung bases.  Liver, spleen and adrenal glands within normal limits.  Mild soft tissue stranding anterior to the pancreatic head (series 2/image 30).  Gallbladder is unremarkable. No intrahepatic ductal dilatation. Common bile duct measures 8 mm.  Tiny bilateral renal cysts.  Right upper pole angiomyolipoma.  No evidence of bowel obstruction.  Normal appendix.  Colonic diverticulosis, without associated inflammatory changes.  Moderate stool in the rectum.  No abdominopelvic ascites.  No suspicious abdominopelvic lymphadenopathy.  Uterus unremarkable.  No adnexal masses.  Bladder is within normal limits.  Degenerative changes of the visualized thoracolumbar spine. Right hip arthroplasty.  IMPRESSION: Mild soft tissue stranding anterior to the pancreatic head, correlate for pancreatitis.  Normal appendix.  No evidence of bowel obstruction.  Additional ancillary findings as above.  Original Report Authenticated By: Charline Bills, M.D.          Sunnie Nielsen, MD 01/20/11 [phone]

## 2011-01-21 ENCOUNTER — Encounter: Payer: Self-pay | Admitting: Family Medicine

## 2011-01-22 ENCOUNTER — Other Ambulatory Visit: Payer: Self-pay

## 2011-01-22 ENCOUNTER — Encounter (HOSPITAL_COMMUNITY): Payer: Self-pay | Admitting: *Deleted

## 2011-01-22 ENCOUNTER — Emergency Department (HOSPITAL_COMMUNITY): Payer: Medicare HMO

## 2011-01-22 ENCOUNTER — Emergency Department (HOSPITAL_COMMUNITY)
Admission: EM | Admit: 2011-01-22 | Discharge: 2011-01-22 | Disposition: A | Discharge status: Home / Self Care | Payer: Medicare HMO | Attending: Emergency Medicine | Admitting: Emergency Medicine

## 2011-01-22 DIAGNOSIS — I498 Other specified cardiac arrhythmias: Secondary | ICD-10-CM | POA: Insufficient documentation

## 2011-01-22 DIAGNOSIS — E119 Type 2 diabetes mellitus without complications: Secondary | ICD-10-CM | POA: Insufficient documentation

## 2011-01-22 DIAGNOSIS — R109 Unspecified abdominal pain: Secondary | ICD-10-CM | POA: Insufficient documentation

## 2011-01-22 DIAGNOSIS — Z794 Long term (current) use of insulin: Secondary | ICD-10-CM | POA: Insufficient documentation

## 2011-01-22 DIAGNOSIS — I10 Essential (primary) hypertension: Secondary | ICD-10-CM | POA: Insufficient documentation

## 2011-01-22 LAB — URINE MICROSCOPIC-ADD ON

## 2011-01-22 LAB — URINALYSIS, ROUTINE W REFLEX MICROSCOPIC
Bilirubin Urine: NEGATIVE
Glucose, UA: NEGATIVE mg/dL
Hgb urine dipstick: NEGATIVE
Specific Gravity, Urine: >1.03 — ABNORMAL HIGH (ref 1.005–1.030)
Urobilinogen, UA: 0.2 mg/dL (ref 0.0–1.0)
pH: 6 (ref 5.0–8.0)

## 2011-01-22 LAB — DIFFERENTIAL
Basophils Absolute: 0 10*3/uL (ref 0.0–0.1)
Basophils Relative: 0 % (ref 0–1)
Lymphocytes Relative: 19 % (ref 12–46)
Neutro Abs: 7.1 10*3/uL (ref 1.7–7.7)
Neutrophils Relative %: 73 % (ref 43–77)

## 2011-01-22 LAB — BASIC METABOLIC PANEL
CO2: 20 mEq/L (ref 19–32)
Chloride: 92 mEq/L — ABNORMAL LOW (ref 96–112)
Glucose, Bld: 168 mg/dL — ABNORMAL HIGH (ref 70–99)
Potassium: 3.2 mEq/L — ABNORMAL LOW (ref 3.5–5.1)
Sodium: 129 mEq/L — ABNORMAL LOW (ref 135–145)

## 2011-01-22 LAB — HEPATIC FUNCTION PANEL
ALT: 10 U/L (ref 0–35)
AST: 16 U/L (ref 0–37)
Total Protein: 7.8 g/dL (ref 6.0–8.3)

## 2011-01-22 LAB — CBC
Hemoglobin: 11 g/dL — ABNORMAL LOW (ref 12.0–15.0)
MCH: 25.5 pg — ABNORMAL LOW (ref 26.0–34.0)
RBC: 4.31 MIL/uL (ref 3.87–5.11)
WBC: 9.5 10*3/uL (ref 4.0–10.5)

## 2011-01-22 LAB — GLUCOSE, CAPILLARY: Glucose-Capillary: 200 mg/dL — ABNORMAL HIGH (ref 70–99)

## 2011-01-22 MED ORDER — SODIUM CHLORIDE 0.9 % IV SOLN: Freq: Once | INTRAVENOUS | Status: DC

## 2011-01-22 MED ORDER — ONDANSETRON HCL 4 MG/2ML IJ SOLN
4.0000 mg | Freq: Once | INTRAMUSCULAR | Status: AC
  Administered 2011-01-22: 4 mg via INTRAVENOUS
  Filled 2011-01-22: qty 2

## 2011-01-22 MED ORDER — MORPHINE SULFATE 2 MG/ML IJ SOLN
2.0000 mg | Freq: Once | INTRAMUSCULAR | Status: AC
  Administered 2011-01-22: 2 mg via INTRAVENOUS
  Filled 2011-01-22: qty 1

## 2011-01-22 MED ORDER — SODIUM CHLORIDE 0.9 % IV SOLN
Freq: Once | INTRAVENOUS | Status: AC
  Administered 2011-01-22: 08:00:00 via INTRAVENOUS

## 2011-01-22 NOTE — ED Notes (Signed)
Pt complain of right flank pain that started around midnight. Pt states has felt hot & was sweating upon arrival.

## 2011-01-22 NOTE — ED Notes (Signed)
Pt called 911 after having right flank pain since 12 mn last night - also diaphoretic since pain started

## 2011-01-22 NOTE — ED Notes (Signed)
Pt states her abdomen is hurting at this time also.

## 2011-01-22 NOTE — ED Provider Notes (Signed)
History     Chief Complaint  Patient presents with  . Flank Pain   Patient is a 77 y.o. female presenting with flank pain. The history is provided by the patient. No language interpreter was used.  Flank Pain This is a new problem. Episode onset: 8 hours ago. The problem occurs constantly. The problem has not changed since onset.Associated symptoms include abdominal pain. Pertinent negatives include no chest pain, no headaches and no shortness of breath. Exacerbated by: palpation. The symptoms are relieved by nothing. She has tried nothing for the symptoms.  Patient c/o right flank pain with associated right sided periumbilical abdominal pain and urinary frequency onset approximately 8 hours ago and persistent since. Patient reports flank pain has not changed since onset. Patient reports Dr. Jerre Simon is to have a bladder procedure performed in 5 days but is unsure of details. Denies dysuria, hematuria, nausea, vomiting, back pain. H/o diabetes, hypertension, dementia. Patient a never smoker but currently chews tobacco.  PCP-Simpson  Patient seen at 7:54 AM   Past Medical History  Diagnosis Date  . Diabetes mellitus   . Hypertension   . Reflux   . Dementia   . Right elbow pain     OTIF    Past Surgical History  Procedure Date  . Orif right hip 1999  . Umbilical hernia repair     Family History  Problem Relation Age of Onset  . Cancer Mother     pelvic     History  Substance Use Topics  . Smoking status: Never Smoker   . Smokeless tobacco: Current User    Types: Chew  . Alcohol Use: No     Hx of Alcohol dependecy     OB History    Grav Para Term Preterm Abortions TAB SAB Ect Mult Living                  Review of Systems  Constitutional: Negative for fever.  Respiratory: Negative for shortness of breath.   Cardiovascular: Negative for chest pain.  Gastrointestinal: Positive for abdominal pain. Negative for nausea and vomiting.  Genitourinary: Positive for flank  pain. Negative for dysuria and hematuria.  Musculoskeletal: Negative for back pain.  Neurological: Negative for headaches.  All other systems reviewed and are negative.  All other systems negative except as noted in HPI.   Physical Exam  BP 160/81  Pulse 89  Temp(Src) 99 F (37.2 C) (Oral)  Resp 20  Ht 5' 6.5" (1.689 m)  Wt 182 lb (82.555 kg)  BMI 28.94 kg/m2  SpO2 95%  Physical Exam  Nursing note and vitals reviewed. Constitutional: She is oriented to person, place, and time. She appears well-developed and well-nourished. No distress.       Appearance consistent with age of record. Tachycardic. Hypertensive.   HENT:  Head: Normocephalic and atraumatic.  Right Ear: External ear normal.  Left Ear: External ear normal.  Nose: Nose normal.  Mouth/Throat: Oropharynx is clear and moist.  Eyes: Conjunctivae are normal.  Neck: Normal range of motion. Neck supple.  Cardiovascular: Normal rate, regular rhythm and normal heart sounds.  Exam reveals no gallop and no friction rub.   No murmur heard. Pulmonary/Chest: Effort normal and breath sounds normal. She has no wheezes. She has no rhonchi. She has no rales. She exhibits no tenderness.  Abdominal: Soft. There is tenderness.       Minimal right flank tenderness.   Musculoskeletal: Normal range of motion.       Normal  appearance of extremities  Neurological: She is alert and oriented to person, place, and time. No sensory deficit.  Skin: Skin is warm and dry. No rash noted.       Color normal  Psychiatric: She has a normal mood and affect.    ED Course  Procedures 11:22 AM-Informed of intent to d/c pending review of Ct-Abdomen performed on January 20, 2011. Patient agrees with plan set forth at this time.  MDM Patient has nontoxic exam. Minimal tenderness in right flank. CT reviewed from 2 days ago. Urine sample shows no infection. Level V caveat for slight dementia. 9:07 AM-Shanon Seawright Adriana Simas, MD informed by radiology techinician that  patient recently had a CT-scan performed on January 20, 2011. Donnetta Hutching, MD decided to hold off on CT-scan at this time.   Date: 01/22/2011  Rate: 101  Rhythm: sinus tachycardia  QRS Axis: normal  Intervals: normal  ST/T Wave abnormalities: normal  Conduction Disutrbances:none  Narrative Interpretation:   Old EKG Reviewed: none available  Results for orders placed during the hospital encounter of 01/22/11  URINALYSIS, ROUTINE W REFLEX MICROSCOPIC      Component Value Range   Color, Urine YELLOW  YELLOW    Appearance CLEAR  CLEAR    Specific Gravity, Urine >1.030 (*) 1.005 - 1.030    pH 6.0  5.0 - 8.0    Glucose, UA NEGATIVE  NEGATIVE (mg/dL)   Hgb urine dipstick NEGATIVE  NEGATIVE    Bilirubin Urine NEGATIVE  NEGATIVE    Ketones, ur NEGATIVE  NEGATIVE (mg/dL)   Protein, ur 119 (*) NEGATIVE (mg/dL)   Urobilinogen, UA 0.2  0.0 - 1.0 (mg/dL)   Nitrite NEGATIVE  NEGATIVE    Leukocytes, UA NEGATIVE  NEGATIVE   CBC      Component Value Range   WBC 9.5  4.0 - 10.5 (K/uL)   RBC 4.31  3.87 - 5.11 (MIL/uL)   Hemoglobin 11.0 (*) 12.0 - 15.0 (g/dL)   HCT 14.7 (*) 82.9 - 46.0 (%)   MCV 78.9  78.0 - 100.0 (fL)   MCH 25.5 (*) 26.0 - 34.0 (pg)   MCHC 32.4  30.0 - 36.0 (g/dL)   RDW 56.2 (*) 13.0 - 15.5 (%)   Platelets 337  150 - 400 (K/uL)  BASIC METABOLIC PANEL      Component Value Range   Sodium 129 (*) 135 - 145 (mEq/L)   Potassium 3.2 (*) 3.5 - 5.1 (mEq/L)   Chloride 92 (*) 96 - 112 (mEq/L)   CO2 20  19 - 32 (mEq/L)   Glucose, Bld 168 (*) 70 - 99 (mg/dL)   BUN 11  6 - 23 (mg/dL)   Creatinine, Ser 8.65  0.50 - 1.10 (mg/dL)   Calcium 9.7  8.4 - 78.4 (mg/dL)   GFR calc non Af Amer >60  >60 (mL/min)   GFR calc Af Amer >60  >60 (mL/min)  URINE MICROSCOPIC-ADD ON      Component Value Range   Squamous Epithelial / LPF FEW (*) RARE    WBC, UA 0-2  <3 (WBC/hpf)   RBC / HPF 3-6  <3 (RBC/hpf)  DIFFERENTIAL      Component Value Range   Neutrophils Relative 73  43 - 77 (%)   Neutro Abs  7.1  1.7 - 7.7 (K/uL)   Lymphocytes Relative 19  12 - 46 (%)   Lymphs Abs 1.9  0.7 - 4.0 (K/uL)   Monocytes Relative 7  3 - 12 (%)   Monocytes Absolute  0.6  0.1 - 1.0 (K/uL)   Eosinophils Relative 1  0 - 5 (%)   Eosinophils Absolute 0.1  0.0 - 0.7 (K/uL)   Basophils Relative 0  0 - 1 (%)   Basophils Absolute 0.0  0.0 - 0.1 (K/uL)  HEPATIC FUNCTION PANEL      Component Value Range   Total Protein 7.8  6.0 - 8.3 (g/dL)   Albumin 3.8  3.5 - 5.2 (g/dL)   AST 16  0 - 37 (U/L)   ALT 10  0 - 35 (U/L)   Alkaline Phosphatase 84  39 - 117 (U/L)   Total Bilirubin 0.2 (*) 0.3 - 1.2 (mg/dL)   Bilirubin, Direct 0.1  0.0 - 0.3 (mg/dL)   Indirect Bilirubin 0.1 (*) 0.3 - 0.9 (mg/dL)  LIPASE, BLOOD      Component Value Range   Lipase 24  11 - 59 (U/L)    Chart written by Clarita Crane acting as scribe for Donnetta Hutching, MD  I personally performed the services described in this documentation, which was scribed in my presence. The recorded information has been reviewed and considered. Donnetta Hutching, MD  CT scan from July 24 reviewed. Comment made on stranding anterior to pancreas. Lipase today is normal. Patient is in no acute distress. No acute abdomen. Patient will followup with primary care doctor.  Donnetta Hutching, MD 01/22/11 1151

## 2011-01-22 NOTE — ED Notes (Addendum)
Pt states flank pain is better at this time, but now reports having pain in her upper abdominal/epigastric region. Pt states she still is unable to give urine sample at this time.

## 2011-01-22 NOTE — ED Notes (Addendum)
Pt reported nausea & having dry heaves at this time.

## 2011-01-22 NOTE — ED Notes (Signed)
Pt resting calmly w/ eyes closed. Rise & fall of the chest noted. Snoring respirations. NAD noted at this time.

## 2011-01-23 ENCOUNTER — Other Ambulatory Visit: Payer: Self-pay

## 2011-01-23 ENCOUNTER — Encounter (HOSPITAL_COMMUNITY)
Admission: RE | Admit: 2011-01-23 | Discharge: 2011-01-23 | Disposition: A | Discharge status: Home / Self Care | Payer: Medicare HMO | Source: Ambulatory Visit | Attending: Urology | Admitting: Urology

## 2011-01-23 ENCOUNTER — Encounter: Payer: Self-pay | Admitting: Family Medicine

## 2011-01-23 ENCOUNTER — Encounter (HOSPITAL_COMMUNITY): Payer: Self-pay

## 2011-01-23 ENCOUNTER — Ambulatory Visit (INDEPENDENT_AMBULATORY_CARE_PROVIDER_SITE_OTHER): Payer: Medicare HMO | Admitting: Family Medicine

## 2011-01-23 ENCOUNTER — Encounter (HOSPITAL_COMMUNITY): Payer: Self-pay | Admitting: *Deleted

## 2011-01-23 ENCOUNTER — Emergency Department (HOSPITAL_COMMUNITY)
Admission: EM | Admit: 2011-01-23 | Discharge: 2011-01-24 | Disposition: A | Discharge status: Home / Self Care | Payer: Medicare HMO | Source: Home / Self Care | Attending: Emergency Medicine | Admitting: Emergency Medicine

## 2011-01-23 VITALS — BP 152/82 | HR 115 | Resp 16 | Ht 64.5 in | Wt 186.0 lb

## 2011-01-23 DIAGNOSIS — E119 Type 2 diabetes mellitus without complications: Secondary | ICD-10-CM

## 2011-01-23 DIAGNOSIS — R51 Headache: Secondary | ICD-10-CM | POA: Insufficient documentation

## 2011-01-23 DIAGNOSIS — E785 Hyperlipidemia, unspecified: Secondary | ICD-10-CM

## 2011-01-23 DIAGNOSIS — M791 Myalgia, unspecified site: Secondary | ICD-10-CM

## 2011-01-23 DIAGNOSIS — I10 Essential (primary) hypertension: Secondary | ICD-10-CM

## 2011-01-23 DIAGNOSIS — IMO0001 Reserved for inherently not codable concepts without codable children: Secondary | ICD-10-CM | POA: Insufficient documentation

## 2011-01-23 DIAGNOSIS — R630 Anorexia: Secondary | ICD-10-CM

## 2011-01-23 DIAGNOSIS — F17200 Nicotine dependence, unspecified, uncomplicated: Secondary | ICD-10-CM | POA: Insufficient documentation

## 2011-01-23 LAB — BASIC METABOLIC PANEL
BUN: 13 mg/dL (ref 6–23)
CO2: 19 mEq/L (ref 19–32)
Calcium: 9.4 mg/dL (ref 8.4–10.5)
Calcium: 9.6 mg/dL (ref 8.4–10.5)
Creatinine, Ser: 0.85 mg/dL (ref 0.50–1.10)
Creatinine, Ser: 0.7 mg/dL (ref 0.50–1.10)
GFR calc Af Amer: >60 mL/min (ref >60)
GFR calc non Af Amer: >60 mL/min (ref >60)
GFR calc non Af Amer: >60 mL/min (ref >60)
Glucose, Bld: 149 mg/dL — ABNORMAL HIGH (ref 70–99)
Glucose, Bld: 179 mg/dL — ABNORMAL HIGH (ref 70–99)
Potassium: 3.2 mEq/L — ABNORMAL LOW (ref 3.5–5.1)
Sodium: 129 mEq/L — ABNORMAL LOW (ref 135–145)

## 2011-01-23 LAB — CBC
HCT: 33.5 % — ABNORMAL LOW (ref 36.0–46.0)
Hemoglobin: 11.1 g/dL — ABNORMAL LOW (ref 12.0–15.0)
MCH: 25.8 pg — ABNORMAL LOW (ref 26.0–34.0)
MCHC: 33.1 g/dL (ref 30.0–36.0)
RDW: 17.6 % — ABNORMAL HIGH (ref 11.5–15.5)

## 2011-01-23 LAB — SURGICAL PCR SCREEN
MRSA, PCR: NEGATIVE
Staphylococcus aureus: NEGATIVE

## 2011-01-23 LAB — HEMOGLOBIN A1C: Hgb A1c MFr Bld: 6.9 % — ABNORMAL HIGH (ref <5.7)

## 2011-01-23 LAB — GLUCOSE, POCT (MANUAL RESULT ENTRY): POC Glucose: 100

## 2011-01-23 MED ORDER — KETOROLAC TROMETHAMINE 30 MG/ML IJ SOLN
30.0000 mg | Freq: Once | INTRAMUSCULAR | Status: AC
  Administered 2011-01-23: 30 mg via INTRAMUSCULAR

## 2011-01-23 MED ORDER — CENTRUM PO LIQD: 10.0000 mL | Freq: Every day | ORAL | Status: DC

## 2011-01-23 NOTE — Patient Instructions (Addendum)
20 [name]  01/23/2011   Your procedure is scheduled on:  01/29/2011  Report to [facility] at 0915 AM.  Call this number if you have problems the morning of surgery: [phone]   Remember:   Do not eat food:After Midnight.  Do not drink clear liquids: After Midnight.  Take these medicines the morning of surgery with A SIP OF WATER: Vicodin, norvasc, Lotensin, Nexium, omepazole, zofran   Do not wear jewelry, make-up or nail polish.  Do not bring valuables to the hospital.  Contacts, dentures or bridgework may not be worn into surgery.  Leave suitcase in the car. After surgery it may be brought to your room.  For patients admitted to the hospital, checkout time is 11:00 AM the day of discharge.   Patients discharged the day of surgery will not be allowed to drive home.  Name and phone number of your driver: Kathie Rhodes  Special Instructions: CHG Shower Shower 2 days before surgery and 1 day before surgery with Hibiclens.   Please read over the following fact sheets that you were given: Pain Booklet, MRSA Information, Surgical Site Infection Prevention and Anesthesia Post-op Instructions   PATIENT INSTRUCTIONS POST-ANESTHESIA  IMMEDIATELY FOLLOWING SURGERY:  Do not drive or operate machinery for the first twenty four hours after surgery.  Do not make any important decisions for twenty four hours after surgery or while taking narcotic pain medications or sedatives.  If you develop intractable nausea and vomiting or a severe headache please notify your doctor immediately.  FOLLOW-UP:  Please make an appointment with your surgeon as instructed. You do not need to follow up with anesthesia unless specifically instructed to do so.  WOUND CARE INSTRUCTIONS (if applicable):  Keep a dry clean dressing on the anesthesia/puncture wound site if there is drainage.  Once the wound has quit draining you may leave it open to air.  Generally you should leave the bandage intact for twenty four hours unless  there is drainage.  If the epidural site drains for more than 36-48 hours please call the anesthesia department.  QUESTIONS?:  Please feel free to call your physician or the hospital operator if you have any questions, and they will be happy to assist you.

## 2011-01-23 NOTE — ED Notes (Signed)
Pt complaining of midsternal non radiating cp 5/10. States pain just began. Placed on cardiac monitor. Sinus rhythm noted. EKg ordered.

## 2011-01-23 NOTE — ED Notes (Signed)
Pt c/o pain in her left side and headache. Pt dozing at intervals. Pt states that she has been unable to sleep or eat for the last couple days. VSS.

## 2011-01-23 NOTE — ED Notes (Signed)
Pt c/o headache with bilateral arm pain and states she just doesn't feel good that started today around 5 pm

## 2011-01-23 NOTE — ED Notes (Signed)
CCM showing NSR. 

## 2011-01-23 NOTE — Patient Instructions (Addendum)
F/u in 2 months.  toradol 30mg  IM today for headache.  Your abdominal and pelvic scans were good.  hBA1C today  Centrum multivitamin daily liquid has been sent in for your apetite

## 2011-01-24 ENCOUNTER — Emergency Department (HOSPITAL_COMMUNITY)
Admission: EM | Admit: 2011-01-24 | Discharge: 2011-01-25 | Disposition: A | Discharge status: Home / Self Care | Payer: Medicare HMO | Source: Home / Self Care | Attending: Emergency Medicine | Admitting: Emergency Medicine

## 2011-01-24 ENCOUNTER — Encounter (HOSPITAL_COMMUNITY): Payer: Self-pay | Admitting: *Deleted

## 2011-01-24 ENCOUNTER — Emergency Department (HOSPITAL_COMMUNITY)
Admission: EM | Admit: 2011-01-24 | Discharge: 2011-01-24 | Disposition: A | Discharge status: Home / Self Care | Payer: Medicare HMO | Source: Home / Self Care | Attending: Emergency Medicine | Admitting: Emergency Medicine

## 2011-01-24 DIAGNOSIS — M79609 Pain in unspecified limb: Secondary | ICD-10-CM | POA: Insufficient documentation

## 2011-01-24 DIAGNOSIS — R1084 Generalized abdominal pain: Secondary | ICD-10-CM

## 2011-01-24 DIAGNOSIS — R61 Generalized hyperhidrosis: Secondary | ICD-10-CM | POA: Insufficient documentation

## 2011-01-24 DIAGNOSIS — R630 Anorexia: Secondary | ICD-10-CM | POA: Insufficient documentation

## 2011-01-24 DIAGNOSIS — R51 Headache: Secondary | ICD-10-CM

## 2011-01-24 DIAGNOSIS — R12 Heartburn: Secondary | ICD-10-CM | POA: Insufficient documentation

## 2011-01-24 DIAGNOSIS — F17200 Nicotine dependence, unspecified, uncomplicated: Secondary | ICD-10-CM | POA: Insufficient documentation

## 2011-01-24 LAB — SALICYLATE LEVEL: Salicylate Lvl: <2 mg/dL — ABNORMAL LOW (ref 2.8–20.0)

## 2011-01-24 LAB — DIFFERENTIAL
Basophils Absolute: 0 10*3/uL (ref 0.0–0.1)
Lymphocytes Relative: 16 % (ref 12–46)
Lymphs Abs: 1.6 10*3/uL (ref 0.7–4.0)
Neutrophils Relative %: 76 % (ref 43–77)

## 2011-01-24 LAB — CBC
Platelets: 327 10*3/uL (ref 150–400)
RBC: 4.17 MIL/uL (ref 3.87–5.11)
RDW: 17.5 % — ABNORMAL HIGH (ref 11.5–15.5)
WBC: 10.5 10*3/uL (ref 4.0–10.5)

## 2011-01-24 LAB — URINALYSIS, ROUTINE W REFLEX MICROSCOPIC
Bilirubin Urine: NEGATIVE
Hgb urine dipstick: NEGATIVE
Ketones, ur: NEGATIVE mg/dL
Nitrite: NEGATIVE
Specific Gravity, Urine: 1.025 (ref 1.005–1.030)
pH: 6 (ref 5.0–8.0)

## 2011-01-24 LAB — COMPREHENSIVE METABOLIC PANEL
ALT: 12 U/L (ref 0–35)
AST: 19 U/L (ref 0–37)
Alkaline Phosphatase: 90 U/L (ref 39–117)
CO2: 21 mEq/L (ref 19–32)
Calcium: 9 mg/dL (ref 8.4–10.5)
GFR calc Af Amer: >60 mL/min (ref >60)
GFR calc non Af Amer: >60 mL/min (ref >60)
Glucose, Bld: 130 mg/dL — ABNORMAL HIGH (ref 70–99)
Potassium: 3.2 mEq/L — ABNORMAL LOW (ref 3.5–5.1)
Sodium: 125 mEq/L — ABNORMAL LOW (ref 135–145)
Total Protein: 7.1 g/dL (ref 6.0–8.3)

## 2011-01-24 MED ORDER — HYDROCODONE-ACETAMINOPHEN 5-325 MG PO TABS
1.0000 | ORAL_TABLET | Freq: Once | ORAL | Status: AC
  Administered 2011-01-24: 1 via ORAL
  Filled 2011-01-24: qty 1

## 2011-01-24 MED ORDER — ACETAMINOPHEN 500 MG PO TABS
500.0000 mg | ORAL_TABLET | Freq: Once | ORAL | Status: AC
  Administered 2011-01-24: 500 mg via ORAL
  Filled 2011-01-24: qty 1

## 2011-01-24 MED ORDER — HYDROCODONE-ACETAMINOPHEN 5-325 MG PO TABS: 1.0000 | ORAL_TABLET | ORAL | Status: AC | PRN

## 2011-01-24 NOTE — ED Notes (Signed)
Pt in room, eating breakfast 

## 2011-01-24 NOTE — Assessment & Plan Note (Signed)
Controlled, no change in medication  

## 2011-01-24 NOTE — ED Provider Notes (Signed)
History     Chief Complaint  Patient presents with  . Headache  . Arm Pain   HPI Comments: Patient with intermittent headache since yesterday. In addition she has had left arm pain today.Pain to arm began this evening and is an aching pain. She is out of hydrocodone. PMD is Dr. Lodema Hong.  Patient is a 77 y.o. female presenting with headaches and arm pain. The history is provided by the patient.  Headache  This is a new problem. The current episode started yesterday. The problem occurs every few hours. The headache is associated with nothing. The pain is located in the frontal region. The quality of the pain is described as dull. The pain is mild. The pain does not radiate. Pertinent negatives include no fever, no malaise/fatigue, no orthopnea, no shortness of breath, no nausea and no vomiting. She has tried NSAIDs for the symptoms. The treatment provided no relief.  Arm Pain Associated symptoms include headaches. Pertinent negatives include no shortness of breath.    Past Medical History  Diagnosis Date  . Diabetes mellitus   . Hypertension   . Reflux   . Dementia   . Right elbow pain     OTIF    Past Surgical History  Procedure Date  . Orif right hip 1999    [facility]  . Umbilical hernia repair 105 months old    [place]    Family History  Problem Relation Age of Onset  . Cancer Mother     pelvic     History  Substance Use Topics  . Smoking status: Never Smoker   . Smokeless tobacco: Current User    Types: Chew  . Alcohol Use: No     Hx of Alcohol dependecy     OB History    Grav Para Term Preterm Abortions TAB SAB Ect Mult Living                  Review of Systems  Constitutional: Negative for fever and malaise/fatigue.  Respiratory: Negative for shortness of breath.   Cardiovascular: Negative for orthopnea.  Gastrointestinal: Negative for nausea and vomiting.  Neurological: Positive for headaches.  All other systems reviewed and are negative.    Physical  Exam  BP 189/96  Pulse 93  Temp(Src) 98.8 F (37.1 C) (Oral)  Resp 20  Ht 5\' 6"  (1.676 m)  Wt 182 lb (82.555 kg)  BMI 29.38 kg/m2  SpO2 95%  Physical Exam  Nursing note and vitals reviewed. Constitutional: She is oriented to person, place, and time. She appears well-developed and well-nourished. No distress.  HENT:  Head: Normocephalic and atraumatic.  Mouth/Throat: Oropharynx is clear and moist.  Eyes: EOM are normal.  Neck: Normal range of motion. Neck supple.  Cardiovascular: Normal rate and normal heart sounds.   Pulmonary/Chest: Effort normal and breath sounds normal.  Abdominal: Soft.  Neurological: She is alert and oriented to person, place, and time.  Skin: Skin is warm and dry.    ED Course  Procedures  MDM     Nurses note and vital signs reviewed.  Nicoletta Dress. Colon Branch, MD 01/24/11 1610

## 2011-01-24 NOTE — Assessment & Plan Note (Signed)
Likely due to poor sleep, no focal deficits on exam, toradol in office administered, and sleep med refilled

## 2011-01-24 NOTE — Assessment & Plan Note (Signed)
Reports poor appetitie, though obese, psychiatric component is a big part of this.I again encourage her to live in a family home, not interested at this time. Liquid vitamin prescribed

## 2011-01-24 NOTE — ED Notes (Signed)
Pt brought back via [facility] for c/o haeadache; pt states she went home and  Took 11-12 advil with no relief and is now c/o abd pain

## 2011-01-24 NOTE — Assessment & Plan Note (Signed)
Uncontrolled. Medication compliance addressed. Commitment to regular exercise, and healthy  eating habits with portion control discussed. DASH diet, and low fat diet discussed, and literature offered. No changes in medication at this time.  

## 2011-01-24 NOTE — ED Notes (Signed)
Called dietary and ordered pt a breakfast tray.

## 2011-01-24 NOTE — ED Notes (Signed)
EDP in with pt 

## 2011-01-24 NOTE — Progress Notes (Signed)
Patient with stable vital signs being treated for a mild colitis and skin infection with doxycycline and metronidazole. Stool has pink tinge and rectal vault with guiac positive mucous.

## 2011-01-24 NOTE — ED Notes (Signed)
Patient stated she has been hurting for a week

## 2011-01-24 NOTE — ED Notes (Signed)
Pt's ride called to come and pick her up. Pt up at bedside getting dressed. States that the pain medicine has not helped her.

## 2011-01-24 NOTE — Progress Notes (Signed)
  Subjective:    Patient ID: [name], female    DOB: 07-Dec-1931, 77 y.o.   MRN: [id number]  HPI C/o general;ized headache x 4 days, denies weakness, numbness or aura, no loss of vision.' Reports poor sleep and is actually out of her medication. States blood sugars when tested are seldom over 130 in the mornings.  Follow up and re-evaluation of chronic medical conditions, medication management and review of any available recent lab and radiology data.  Preventive health is updated, specifically  Cancer screening and Immunization.   Questions or concerns regarding consultations or procedures which the PT has had in the interim are  addressed. The PT denies any adverse reactions to current medications since the last visit.  There are no new concerns.         Review of Systems Denies recent fever or chills. Denies sinus pressure, nasal congestion, ear pain or sore throat. Denies chest congestion, productive cough or wheezing. Denies chest pains, palpitations and leg swelling Denies abdominal pain, nausea, vomiting,diarrhea or constipation.   Denies dysuria, frequency, hesitancy or incontinence. Denies joint pain, swelling and limitation in mobility. Denies  seizures, numbness, or tingling. Denies uncontrolled  Depression or  anxiety . Denies skin break down or rash.        Objective:   Physical Exam Patient alert and oriented and in no cardiopulmonary distress.  HEENT: No facial asymmetry, EOMI, no sinus tenderness,  oropharynx pink and moist.  Neck supple no adenopathy. Fundoscopy: no hemorrhage  Chest: Clear to auscultation bilaterally.  CVS: S1, S2 no murmurs, no S3.  ABD: Soft non tender. Bowel sounds normal.  Ext: No edema  MS: Adequate ROM spine, shoulders, hips and knees.  Skin: Intact, no ulcerations or rash noted.  Psych: Good eye contact, normal affect. Memory intact not anxious or depressed appearing.  CNS: CN 2-12 intact, power, tone and sensation  normal throughout.        Assessment & Plan:   No problem-specific assessment & plan notes found for this encounter.

## 2011-01-24 NOTE — ED Notes (Signed)
Pt dozing at intervals. Medicated po for c/o pain. CCM showing NSR.

## 2011-01-24 NOTE — ED Notes (Signed)
Pt given cup of ice water and a blanket.

## 2011-01-24 NOTE — Progress Notes (Signed)
Patient states she feels better. Headache is better and pain to left arm has eased. Ready for discharge.

## 2011-01-24 NOTE — ED Provider Notes (Signed)
History     Chief Complaint  Patient presents with  . Abdominal Pain  . Headache  . Arm Pain   HPI Comments: Patient clarified that she took 3 aleve and 3 ibuprofen since she left the ER  Patient is a 77 y.o. female presenting with abdominal pain, headaches, and arm pain. The history is provided by the patient.  Abdominal Pain The primary symptoms of the illness include abdominal pain. The current episode started 3 to 5 hours ago (Patient was discharged from the ER at 0105 this morning after being evaluated for c/o headache and arm pain. She states she went home, ate an egg and began to feel bad. Upset stomach, headache returned. She took 5 or 6 ibuprofen. She began to sweat. Mild n). The onset of the illness was gradual. The problem has not changed since onset. The illness is associated with NSAID use. The patient states that she believes she is currently not pregnant. The patient has not had a change in bowel habit. Additional symptoms associated with the illness include diaphoresis and heartburn. Symptoms associated with the illness do not include chills, constipation, urgency or hematuria. Significant associated medical issues include GERD. Associated medical issues comments: mild dementia.  Headache  This is a recurrent problem. The current episode started yesterday. The headache is associated with emotional stress. The pain is located in the frontal region. The quality of the pain is described as dull and throbbing. The pain is moderate. She has tried NSAIDs for the symptoms. The treatment provided no relief.  Arm Pain Associated symptoms include abdominal pain and headaches.    Past Medical History  Diagnosis Date  . Diabetes mellitus   . Hypertension   . Reflux   . Dementia   . Right elbow pain     OTIF    Past Surgical History  Procedure Date  . Orif right hip 1999    [facility]  . Umbilical hernia repair 24 months old    [place]    Family History  Problem Relation Age of  Onset  . Cancer Mother     pelvic     History  Substance Use Topics  . Smoking status: Never Smoker   . Smokeless tobacco: Current User    Types: Chew  . Alcohol Use: No     Hx of Alcohol dependecy     OB History    Grav Para Term Preterm Abortions TAB SAB Ect Mult Living                  Review of Systems  Constitutional: Positive for diaphoresis. Negative for chills.  Gastrointestinal: Positive for heartburn and abdominal pain. Negative for constipation.  Genitourinary: Negative for urgency and hematuria.  Neurological: Positive for headaches.  All other systems reviewed and are negative.    Physical Exam  BP 185/85  Pulse 96  Temp(Src) 98.9 F (37.2 C) (Oral)  Resp 20  Ht 5\' 6"  (1.676 m)  Wt 182 lb (82.555 kg)  BMI 29.38 kg/m2  SpO2 95%  Physical Exam  Constitutional: She is oriented to person, place, and time. She appears well-developed and well-nourished. No distress.  HENT:  Head: Normocephalic and atraumatic.  Right Ear: External ear normal.  Left Ear: External ear normal.  Eyes: EOM are normal.  Neck: Normal range of motion. Neck supple.  Cardiovascular: Normal rate, normal heart sounds and intact distal pulses.   Pulmonary/Chest: Effort normal and breath sounds normal.  Abdominal: Soft. Bowel sounds are normal.  Neurological: She is alert and oriented to person, place, and time.  Skin: Skin is warm and dry.    ED Course  Procedures  MDM       Nicoletta Dress. Colon Branch, MD 01/24/11 [phone]

## 2011-01-24 NOTE — Assessment & Plan Note (Signed)
Low fat diet discussed and encouraged, rept labs due next visit, no med change

## 2011-01-25 ENCOUNTER — Encounter (HOSPITAL_COMMUNITY): Payer: Self-pay | Admitting: *Deleted

## 2011-01-25 ENCOUNTER — Emergency Department (HOSPITAL_COMMUNITY): Payer: Medicare HMO

## 2011-01-25 ENCOUNTER — Emergency Department (HOSPITAL_COMMUNITY)
Admission: EM | Admit: 2011-01-25 | Discharge: 2011-01-25 | Disposition: A | Discharge status: Home / Self Care | Payer: Medicare HMO | Source: Home / Self Care | Attending: Emergency Medicine | Admitting: Emergency Medicine

## 2011-01-25 DIAGNOSIS — R109 Unspecified abdominal pain: Secondary | ICD-10-CM | POA: Insufficient documentation

## 2011-01-25 DIAGNOSIS — F17200 Nicotine dependence, unspecified, uncomplicated: Secondary | ICD-10-CM | POA: Insufficient documentation

## 2011-01-25 DIAGNOSIS — R51 Headache: Secondary | ICD-10-CM

## 2011-01-25 MED ORDER — ONDANSETRON 8 MG PO TBDP
8.0000 mg | ORAL_TABLET | Freq: Once | ORAL | Status: AC
  Administered 2011-01-25: 8 mg via ORAL
  Filled 2011-01-25: qty 1

## 2011-01-25 MED ORDER — SODIUM CHLORIDE 0.9 % IV BOLUS (SEPSIS): 500.0000 mL | Freq: Once | INTRAVENOUS | Status: DC

## 2011-01-25 MED ORDER — ONDANSETRON HCL 4 MG/2ML IJ SOLN
4.0000 mg | Freq: Once | INTRAMUSCULAR | Status: AC
  Administered 2011-01-25: 4 mg via INTRAVENOUS
  Filled 2011-01-25: qty 2

## 2011-01-25 MED ORDER — HYDROMORPHONE HCL 1 MG/ML IJ SOLN
1.0000 mg | Freq: Once | INTRAMUSCULAR | Status: AC
  Administered 2011-01-25: 1 mg via INTRAMUSCULAR
  Filled 2011-01-25: qty 1

## 2011-01-25 MED ORDER — METHYLPREDNISOLONE SODIUM SUCC 125 MG IJ SOLR
125.0000 mg | Freq: Once | INTRAMUSCULAR | Status: AC
  Administered 2011-01-25: 125 mg via INTRAMUSCULAR
  Filled 2011-01-25: qty 2

## 2011-01-25 MED ORDER — SODIUM CHLORIDE 0.9 % IV SOLN: Freq: Once | INTRAVENOUS | Status: DC

## 2011-01-25 MED ORDER — HYDROMORPHONE HCL 1 MG/ML IJ SOLN
1.0000 mg | Freq: Once | INTRAMUSCULAR | Status: AC
  Administered 2011-01-25: 1 mg via INTRAVENOUS
  Filled 2011-01-25: qty 1

## 2011-01-25 NOTE — ED Notes (Signed)
REMAINS RESTING IN BED ON RIGHT SIDE. NAD. EQUAL CHEST RISE AND FALL. NO FACIAL GRIMACES. CALL BELL AT BS. WILL CONT TO MONITOR. PENDING MD EVAL.

## 2011-01-25 NOTE — ED Provider Notes (Addendum)
History     Chief Complaint  Patient presents with  . Headache   Patient is a 77 y.o. female presenting with headaches. The history is provided by the patient.  Headache  This is a recurrent problem. The current episode started more than 2 days ago (several days ago). The problem occurs constantly. The problem has not changed since onset.The headache is associated with nothing (constipation). The pain is located in the left unilateral region. The quality of the pain is described as throbbing. The pain is at a severity of 7/10. The pain is moderate. The pain does not radiate. Associated symptoms include anorexia. Pertinent negatives include no fever, no near-syncope, no orthopnea, no palpitations, no syncope, no shortness of breath, no nausea and no vomiting. She has tried oral narcotic analgesics and NSAIDs for the symptoms. The treatment provided no relief.    Past Medical History  Diagnosis Date  . Diabetes mellitus   . Hypertension   . Reflux   . Dementia   . Right elbow pain     OTIF    Past Surgical History  Procedure Date  . Orif right hip 1999    [facility]  . Umbilical hernia repair 64 months old    [place]    Family History  Problem Relation Age of Onset  . Cancer Mother     pelvic     History  Substance Use Topics  . Smoking status: Never Smoker   . Smokeless tobacco: Current User    Types: Chew  . Alcohol Use: No     Hx of Alcohol dependecy     OB History    Grav Para Term Preterm Abortions TAB SAB Ect Mult Living                  Review of Systems  Constitutional: Negative for fever.  Respiratory: Negative for shortness of breath.   Cardiovascular: Negative for palpitations, orthopnea, syncope and near-syncope.  Gastrointestinal: Positive for anorexia. Negative for nausea and vomiting.  Neurological: Positive for headaches.  All other systems reviewed and are negative.    Physical Exam  BP 160/80  Pulse 88  Temp(Src) 99 F (37.2 C) (Oral)  Resp  20  Ht 5\' 4"  (1.626 m)  SpO2 99%  Physical Exam  Nursing note and vitals reviewed. Constitutional: She is oriented to person, place, and time. She appears well-developed and well-nourished. No distress.  HENT:  Head: Normocephalic and atraumatic.  Right Ear: External ear normal.  Left Ear: External ear normal.  Nose: Nose normal.  Mouth/Throat: Oropharynx is clear and moist.  Eyes: EOM are normal. Pupils are equal, round, and reactive to light.  Neck: Normal range of motion. Neck supple.  Cardiovascular: Normal rate, normal heart sounds and intact distal pulses.   Pulmonary/Chest: Effort normal and breath sounds normal.  Abdominal: Soft. Bowel sounds are normal. There is no tenderness.       Marked tenderness to palpation  on left abdominal wall , guarding, rebound, no rigidity  Musculoskeletal: Normal range of motion.  Neurological: She is alert and oriented to person, place, and time. She has normal reflexes. No cranial nerve deficit. Coordination normal.  Skin: Skin is warm and dry.  Psychiatric: She has a normal mood and affect.    ED Course  Procedures  MDM  Headache has improved. Reviewed with patient the last two days of visits to the ER. Advised her of need to follow up with Dr. Lodema Hong on Monday.  Nicoletta Dress. Colon Branch, MD 01/25/11 0430

## 2011-01-25 NOTE — ED Notes (Signed)
PT RESTING ON BACK IN BED. PAIN 8/10. NAD. DENIES ANY NEEDS AT THIS TIME. EQUAL CHEST RISE AND FALL. CALL BELL AT BS. WILL CONT TO MONITOR.

## 2011-01-25 NOTE — ED Notes (Signed)
GIVEN SOCKS AND WARM BLANKET PER REQUEST. NAD. DENIES ANY NEEDS AT THIS TIME. CALL BELL AT BS. PAIN 8/10.

## 2011-01-25 NOTE — ED Notes (Signed)
PT REPORT GIVEN TO ONCOMING SHIFT. ASSUMING CARE OF PT. INTO ROOM TO SEE PT. RESTING IN BED ON LEFT SIDE WITH EYES OPEN AND LIGHTS ON. STATES PAIN IS 8\10. DENIES ANY NEEDS AT THIS TIME. NAD. CALL BELL AT BS. WILL CONT TO MONITOR.

## 2011-01-25 NOTE — ED Notes (Signed)
Pt to radiology via stretcher.  

## 2011-01-25 NOTE — ED Notes (Signed)
Headache since 3pm, abdominal pain and shoulder pain

## 2011-01-25 NOTE — ED Provider Notes (Signed)
History     Chief Complaint  Patient presents with  . Headache   The history is provided by the patient. No language interpreter was used.    Past Medical History  Diagnosis Date  . Diabetes mellitus   . Hypertension   . Reflux   . Dementia   . Right elbow pain     OTIF    Past Surgical History  Procedure Date  . Orif right hip 1999    [facility]  . Umbilical hernia repair 50 months old    [place]    Family History  Problem Relation Age of Onset  . Cancer Mother     pelvic     History  Substance Use Topics  . Smoking status: Never Smoker   . Smokeless tobacco: Current User    Types: Chew  . Alcohol Use: No     Hx of Alcohol dependecy     OB History    Grav Para Term Preterm Abortions TAB SAB Ect Mult Living                  Review of Systems  Gastrointestinal: Positive for abdominal pain. Negative for nausea, vomiting, diarrhea, constipation, abdominal distention and anal bleeding.  Neurological: Positive for headaches. Negative for dizziness, seizures, syncope, speech difficulty, weakness, light-headedness and numbness.  All other systems reviewed and are negative.    Physical Exam  BP 141/54  Pulse 89  Temp(Src) 99.5 F (37.5 C) (Oral)  Resp 16  Ht 5' 6.5" (1.689 m)  Wt 182 lb (82.555 kg)  BMI 28.94 kg/m2  SpO2 96%  Physical Exam  Nursing note and vitals reviewed. Constitutional: She is oriented to person, place, and time. Vital signs are normal. She appears well-developed and well-nourished. No distress.  HENT:  Head: Normocephalic and atraumatic.    Right Ear: External ear normal.  Left Ear: External ear normal.  Nose: Nose normal.  Mouth/Throat: No oropharyngeal exudate.  Eyes: Conjunctivae and EOM are normal. Pupils are equal, round, and reactive to light. Right eye exhibits no discharge. Left eye exhibits no discharge. No scleral icterus.  Neck: Normal range of motion. Neck supple. No JVD present. No tracheal deviation present. No  thyromegaly present.  Cardiovascular: Normal rate, regular rhythm, normal heart sounds, intact distal pulses and normal pulses.  Exam reveals no gallop and no friction rub.   No murmur heard. Pulmonary/Chest: Effort normal and breath sounds normal. No stridor. No respiratory distress. She has no wheezes. She has no rales. She exhibits no tenderness.  Abdominal: Soft. Normal appearance and bowel sounds are normal. She exhibits no distension and no mass. There is no tenderness. There is no rebound and no guarding.  Musculoskeletal: Normal range of motion. She exhibits no edema and no tenderness.  Lymphadenopathy:    She has no cervical adenopathy.  Neurological: She is alert and oriented to person, place, and time. She has normal strength and normal reflexes. No cranial nerve deficit or sensory deficit. Coordination normal. GCS eye subscore is 4. GCS verbal subscore is 5. GCS motor subscore is 6.  Reflex Scores:      Tricep reflexes are 2+ on the right side and 2+ on the left side.      Bicep reflexes are 2+ on the right side and 2+ on the left side.      Brachioradialis reflexes are 2+ on the right side and 2+ on the left side.      Patellar reflexes are 2+ on the right  side and 2+ on the left side.      Achilles reflexes are 2+ on the right side and 2+ on the left side. Skin: Skin is warm and dry. No rash noted. She is not diaphoretic.  Psychiatric: She has a normal mood and affect. Her speech is normal and behavior is normal. Judgment and thought content normal. Cognition and memory are normal.    ED Course  Procedures  MDM Pt has been to the ED on July 24, 26, 27, 28 and today for frontal headache and B abdominal pain.  Had abdominal CT which raised question of pancreatitis which does not correlate with her exam and pain that she localizes to lateral aspects of lower quadrants.  Lab work not repeated today.  It has shown repeated hyponatremia, hypokalemia and hyperglycemia.  Normal  lipase.     Worthy Rancher, PA 01/25/11 2245  Worthy Rancher, PA 01/25/11 2248  Worthy Rancher, PA 01/25/11 2257 Medical screening examination/treatment/procedure(s) were conducted as a shared visit with non-physician practitioner(s) and myself.  I personally evaluated the patient during the encounter  Nicoletta Dress. Colon Branch, MD 01/29/11 2336

## 2011-01-25 NOTE — ED Notes (Signed)
MD AT BS TO EVALUATE.

## 2011-01-25 NOTE — ED Notes (Signed)
Pt c/o headache that started this afternoon, abd pain that started last pm, ems advises that this is pt's third trip to er this weekend, pt lives by herself, does state that she did take advil without any improvement in headache,

## 2011-01-25 NOTE — ED Notes (Signed)
Pt back to room from radiology. Denies any needs. Nad. Pain 8/10. Call bell at bs. Bed in low position and locked with side rails up.

## 2011-01-25 NOTE — ED Notes (Addendum)
INTO ROOM TO SEE PT. PAIN LEVEL 7/10. DENIES NAUSEA. DENIES ANY NEEDS. STATES SHE IS STARTING TO FEEL BETTER. CALL BELL AT BS. WILL CONT TO MONITOR.

## 2011-01-25 NOTE — ED Notes (Signed)
PT RESTING ON LEFT SIDE WITH EYES CLOSED AND LIGHTS OFF. NAD. EQUAL CHEST RISE AND FALL. NO FACIAL GRIMACES. CALL BELL AT BS. BED IN LOW POSITION AND LOCKED WITH SIDE RAILS UP.

## 2011-01-25 NOTE — ED Notes (Signed)
INTO ROOM TO SEE PT. REMAINS RESTING ON LEFT SIDE. EYES CLOSED. NAD. EQUAL CHEST RISE AND FALL. CALL BELL AT BS. BED IN LOW POSITION AND LOCKED WITH SIDE RAILS UP. NO FACIAL GRIMACES. WILL CONT TO MONITOR.

## 2011-01-26 ENCOUNTER — Emergency Department (HOSPITAL_COMMUNITY): Payer: Medicare HMO

## 2011-01-26 ENCOUNTER — Other Ambulatory Visit: Payer: Self-pay

## 2011-01-26 ENCOUNTER — Inpatient Hospital Stay (HOSPITAL_COMMUNITY)
Admission: EM | Admit: 2011-01-26 | Discharge: 2011-02-20 | DRG: 357 | Disposition: A | Discharge status: Skilled Nursing Facility | Payer: Medicare HMO | Attending: Internal Medicine | Admitting: Internal Medicine

## 2011-01-26 ENCOUNTER — Encounter (HOSPITAL_COMMUNITY): Payer: Self-pay

## 2011-01-26 DIAGNOSIS — K3189 Other diseases of stomach and duodenum: Secondary | ICD-10-CM

## 2011-01-26 DIAGNOSIS — R578 Other shock: Secondary | ICD-10-CM | POA: Diagnosis not present

## 2011-01-26 DIAGNOSIS — R5381 Other malaise: Secondary | ICD-10-CM | POA: Diagnosis present

## 2011-01-26 DIAGNOSIS — N3000 Acute cystitis without hematuria: Secondary | ICD-10-CM

## 2011-01-26 DIAGNOSIS — Z87898 Personal history of other specified conditions: Secondary | ICD-10-CM | POA: Diagnosis present

## 2011-01-26 DIAGNOSIS — D649 Anemia, unspecified: Secondary | ICD-10-CM

## 2011-01-26 DIAGNOSIS — R5383 Other fatigue: Secondary | ICD-10-CM

## 2011-01-26 DIAGNOSIS — K760 Fatty (change of) liver, not elsewhere classified: Secondary | ICD-10-CM | POA: Diagnosis not present

## 2011-01-26 DIAGNOSIS — N179 Acute kidney failure, unspecified: Secondary | ICD-10-CM | POA: Diagnosis not present

## 2011-01-26 DIAGNOSIS — L02219 Cutaneous abscess of trunk, unspecified: Secondary | ICD-10-CM | POA: Diagnosis not present

## 2011-01-26 DIAGNOSIS — K869 Disease of pancreas, unspecified: Secondary | ICD-10-CM | POA: Diagnosis present

## 2011-01-26 DIAGNOSIS — A048 Other specified bacterial intestinal infections: Secondary | ICD-10-CM | POA: Diagnosis present

## 2011-01-26 DIAGNOSIS — F0390 Unspecified dementia without behavioral disturbance: Secondary | ICD-10-CM | POA: Diagnosis present

## 2011-01-26 DIAGNOSIS — D62 Acute posthemorrhagic anemia: Secondary | ICD-10-CM | POA: Diagnosis present

## 2011-01-26 DIAGNOSIS — I10 Essential (primary) hypertension: Secondary | ICD-10-CM | POA: Diagnosis present

## 2011-01-26 DIAGNOSIS — E785 Hyperlipidemia, unspecified: Secondary | ICD-10-CM | POA: Diagnosis present

## 2011-01-26 DIAGNOSIS — Y838 Other surgical procedures as the cause of abnormal reaction of the patient, or of later complication, without mention of misadventure at the time of the procedure: Secondary | ICD-10-CM | POA: Diagnosis not present

## 2011-01-26 DIAGNOSIS — K5730 Diverticulosis of large intestine without perforation or abscess without bleeding: Secondary | ICD-10-CM | POA: Diagnosis present

## 2011-01-26 DIAGNOSIS — R55 Syncope and collapse: Secondary | ICD-10-CM

## 2011-01-26 DIAGNOSIS — K6289 Other specified diseases of anus and rectum: Secondary | ICD-10-CM

## 2011-01-26 DIAGNOSIS — I313 Pericardial effusion (noninflammatory): Secondary | ICD-10-CM | POA: Diagnosis not present

## 2011-01-26 DIAGNOSIS — T8140XA Infection following a procedure, unspecified, initial encounter: Secondary | ICD-10-CM | POA: Diagnosis not present

## 2011-01-26 DIAGNOSIS — E876 Hypokalemia: Secondary | ICD-10-CM | POA: Diagnosis not present

## 2011-01-26 DIAGNOSIS — K250 Acute gastric ulcer with hemorrhage: Secondary | ICD-10-CM | POA: Diagnosis present

## 2011-01-26 DIAGNOSIS — E119 Type 2 diabetes mellitus without complications: Secondary | ICD-10-CM

## 2011-01-26 DIAGNOSIS — D126 Benign neoplasm of colon, unspecified: Secondary | ICD-10-CM | POA: Diagnosis present

## 2011-01-26 DIAGNOSIS — E871 Hypo-osmolality and hyponatremia: Secondary | ICD-10-CM

## 2011-01-26 DIAGNOSIS — B9681 Helicobacter pylori [H. pylori] as the cause of diseases classified elsewhere: Secondary | ICD-10-CM | POA: Diagnosis present

## 2011-01-26 DIAGNOSIS — Z9889 Other specified postprocedural states: Secondary | ICD-10-CM

## 2011-01-26 DIAGNOSIS — K859 Acute pancreatitis without necrosis or infection, unspecified: Secondary | ICD-10-CM

## 2011-01-26 DIAGNOSIS — G4700 Insomnia, unspecified: Secondary | ICD-10-CM

## 2011-01-26 DIAGNOSIS — K633 Ulcer of intestine: Secondary | ICD-10-CM | POA: Diagnosis present

## 2011-01-26 DIAGNOSIS — R5082 Postprocedural fever: Secondary | ICD-10-CM | POA: Diagnosis not present

## 2011-01-26 DIAGNOSIS — K219 Gastro-esophageal reflux disease without esophagitis: Secondary | ICD-10-CM | POA: Diagnosis present

## 2011-01-26 DIAGNOSIS — R51 Headache: Secondary | ICD-10-CM

## 2011-01-26 HISTORY — DX: Diaphragmatic hernia without obstruction or gangrene: K44.9

## 2011-01-26 HISTORY — DX: Depression, unspecified: F32.A

## 2011-01-26 HISTORY — DX: Anemia, unspecified: D64.9

## 2011-01-26 HISTORY — DX: Major depressive disorder, single episode, unspecified: F32.9

## 2011-01-26 HISTORY — DX: Unspecified osteoarthritis, unspecified site: M19.90

## 2011-01-26 HISTORY — DX: Other specified postprocedural states: Z98.890

## 2011-01-26 HISTORY — DX: Ulcer of esophagus without bleeding: K22.10

## 2011-01-26 HISTORY — DX: Benign lipomatous neoplasm of kidney: D17.71

## 2011-01-26 HISTORY — DX: Acute pancreatitis without necrosis or infection, unspecified: K85.90

## 2011-01-26 LAB — DIFFERENTIAL
Basophils Relative: 0 % (ref 0–1)
Lymphocytes Relative: 9 % — ABNORMAL LOW (ref 12–46)
Lymphs Abs: 1.6 10*3/uL (ref 0.7–4.0)
Monocytes Absolute: 1.2 10*3/uL — ABNORMAL HIGH (ref 0.1–1.0)
Monocytes Relative: 7 % (ref 3–12)
Neutro Abs: 15.2 10*3/uL — ABNORMAL HIGH (ref 1.7–7.7)
Neutrophils Relative %: 84 % — ABNORMAL HIGH (ref 43–77)

## 2011-01-26 LAB — COMPREHENSIVE METABOLIC PANEL
ALT: 15 U/L (ref 0–35)
AST: 14 U/L (ref 0–37)
Alkaline Phosphatase: 60 U/L (ref 39–117)
CO2: 22 mEq/L (ref 19–32)
Calcium: 9.3 mg/dL (ref 8.4–10.5)
Chloride: 93 mEq/L — ABNORMAL LOW (ref 96–112)
GFR calc Af Amer: >60 mL/min (ref >60)
GFR calc non Af Amer: 55 mL/min — ABNORMAL LOW (ref >60)
Glucose, Bld: 259 mg/dL — ABNORMAL HIGH (ref 70–99)
Potassium: 4.2 mEq/L (ref 3.5–5.1)
Sodium: 125 mEq/L — ABNORMAL LOW (ref 135–145)

## 2011-01-26 LAB — URINALYSIS, ROUTINE W REFLEX MICROSCOPIC
Leukocytes, UA: NEGATIVE
Nitrite: NEGATIVE
Protein, ur: NEGATIVE mg/dL
Specific Gravity, Urine: 1.015 (ref 1.005–1.030)
Urobilinogen, UA: 0.2 mg/dL (ref 0.0–1.0)

## 2011-01-26 LAB — CBC
HCT: 19.7 % — ABNORMAL LOW (ref 36.0–46.0)
Hemoglobin: 6.7 g/dL — CL (ref 12.0–15.0)
MCHC: 34 g/dL (ref 30.0–36.0)
RBC: 2.53 MIL/uL — ABNORMAL LOW (ref 3.87–5.11)
WBC: 18 10*3/uL — ABNORMAL HIGH (ref 4.0–10.5)

## 2011-01-26 LAB — LIPASE, BLOOD: Lipase: 22 U/L (ref 11–59)

## 2011-01-26 LAB — D-DIMER, QUANTITATIVE: D-Dimer, Quant: 1.62 ug/mL-FEU — ABNORMAL HIGH (ref 0.00–0.48)

## 2011-01-26 MED ORDER — SODIUM CHLORIDE 0.9 % IV BOLUS (SEPSIS): 1000.0000 mL | Freq: Once | INTRAVENOUS | Status: DC

## 2011-01-26 MED ORDER — IOHEXOL 350 MG/ML SOLN
100.0000 mL | Freq: Once | INTRAVENOUS | Status: AC | PRN
  Administered 2011-01-26: 100 mL via INTRAVENOUS

## 2011-01-26 MED ORDER — ONDANSETRON HCL 4 MG/2ML IJ SOLN
4.0000 mg | Freq: Once | INTRAMUSCULAR | Status: AC
  Administered 2011-01-26: 4 mg via INTRAVENOUS
  Filled 2011-01-26: qty 2

## 2011-01-26 MED ORDER — SODIUM CHLORIDE 0.9 % IV SOLN
Freq: Once | INTRAVENOUS | Status: AC
  Administered 2011-01-26 (×2): via INTRAVENOUS

## 2011-01-26 MED ORDER — HYDROMORPHONE HCL 1 MG/ML IJ SOLN
0.5000 mg | Freq: Once | INTRAMUSCULAR | Status: AC
  Administered 2011-01-26: 0.5 mg via INTRAVENOUS
  Filled 2011-01-26: qty 1

## 2011-01-26 NOTE — ED Provider Notes (Signed)
History     Chief Complaint  Patient presents with  . Fatigue   HPI  Past Medical History  Diagnosis Date  . Diabetes mellitus   . Hypertension   . Reflux   . Dementia   . Right elbow pain     OTIF    Past Surgical History  Procedure Date  . Orif right hip 1999    [facility]  . Umbilical hernia repair 22 months old    [place]    Family History  Problem Relation Age of Onset  . Cancer Mother     pelvic     History  Substance Use Topics  . Smoking status: Never Smoker   . Smokeless tobacco: Current User    Types: Chew  . Alcohol Use: No     Hx of Alcohol dependecy     OB History    Grav Para Term Preterm Abortions TAB SAB Ect Mult Living                  Review of Systems  Physical Exam  BP 113/58  Temp(Src) 98 F (36.7 C) (Oral)  Resp 20  SpO2 99%  Physical Exam  ED Course  Procedures  MDM       Felisa Bonier, MD 01/29/11 2019

## 2011-01-26 NOTE — ED Notes (Signed)
Pt has been incontinent of urine x2 in ER, cleansed and linen change.

## 2011-01-26 NOTE — ED Notes (Signed)
Pt states she has been weak all day. States she went out in the yard to get her neighbor to call 911 for. Pt states she almost passed out. Someone helped pt to the ground

## 2011-01-26 NOTE — ED Notes (Signed)
Dr Fredricka Bonine notified  Of  Hgb 6.7.

## 2011-01-26 NOTE — ED Notes (Signed)
Dr Campbell in with pt

## 2011-01-26 NOTE — ED Notes (Signed)
Patient is resting comfortably. 

## 2011-01-26 NOTE — ED Notes (Signed)
Called hospitalist for adm

## 2011-01-26 NOTE — ED Notes (Signed)
Call placed to Rosemarie Ax, on call for Social Work, per Dr. Effie Shy.  Awaiting return call.

## 2011-01-26 NOTE — ED Notes (Signed)
Pt waiting to be eval by edp 

## 2011-01-26 NOTE — ED Notes (Signed)
Asleep.

## 2011-01-26 NOTE — ED Notes (Signed)
Report called  

## 2011-01-26 NOTE — ED Notes (Signed)
Pt informed urine sample is needed unable to go at this time

## 2011-01-26 NOTE — Pre-Procedure Instructions (Signed)
Labs shown to Dr. Jayme Cloud.  Will obtain I-Stat the morning of procedure.

## 2011-01-26 NOTE — ED Provider Notes (Signed)
History   Written by Enos Fling acting as scribe for Felisa Bonier, MD.   Chief Complaint  Patient presents with  . Fatigue   The history is provided by the patient and the EMS personnel.   [name] is a 77 y.o. female who presents to the Emergency Department complaining of weakness. Pt reports she was feeling lightheaded and weak so walked outside and "fell out." EMS was called by neighbors who informed EMS that there was no LOC or head injury, they had helped pt to ground and that she did not actually pass out. Pt states she did pass out. Pt also c/o diffuse ha intermittently x 1.5 weeks and dull, periumbilical abd pain x 1 week. No n/v/d, cp, or sob. Pt reports she has not been eating or drinking well for several days d/t not feeling well. Pt states she has a surgery on her bladder scheduled for Thursday with Dr. Jerre Simon.  Past Medical History  Diagnosis Date  . Diabetes mellitus   . Hypertension   . Reflux   . Dementia   . Right elbow pain     OTIF    Past Surgical History  Procedure Date  . Orif right hip 1999    [facility]  . Umbilical hernia repair 48 months old    [place]    Family History  Problem Relation Age of Onset  . Cancer Mother     pelvic     History  Substance Use Topics  . Smoking status: Never Smoker   . Smokeless tobacco: Current User    Types: Chew  . Alcohol Use: No     Hx of Alcohol dependecy     OB History    Grav Para Term Preterm Abortions TAB SAB Ect Mult Living                  Review of Systems  Constitutional: Positive for appetite change. Negative for fever, chills and diaphoresis.  HENT: Negative for hearing loss, ear pain, congestion, sore throat, facial swelling, rhinorrhea, trouble swallowing, neck pain, neck stiffness, dental problem, sinus pressure and tinnitus.   Eyes: Negative for photophobia, pain, discharge, redness and visual disturbance.  Respiratory: Negative.   Cardiovascular: Negative.   Gastrointestinal:  Negative for nausea and vomiting.  Genitourinary: Negative.   Musculoskeletal: Negative for back pain.  Skin: Negative for rash.  Neurological: Positive for light-headedness and headaches. Negative for dizziness, seizures, syncope and numbness.  Psychiatric/Behavioral: Negative for confusion.    Physical Exam  BP 113/58  Temp(Src) 98 F (36.7 C) (Oral)  Resp 20  SpO2 99%  Physical Exam  Constitutional: She is oriented to person, place, and time. She appears well-developed and well-nourished. No distress.  HENT:  Head: Normocephalic and atraumatic.  Right Ear: External ear normal.  Left Ear: External ear normal.  Mouth/Throat: Oropharynx is clear and moist. No oropharyngeal exudate.  Eyes: Conjunctivae and EOM are normal. Pupils are equal, round, and reactive to light. Right eye exhibits no nystagmus. Left eye exhibits no nystagmus.  Fundoscopic exam:      The right eye shows no papilledema.       The left eye shows no papilledema.  Neck: Normal range of motion, full passive range of motion without pain and phonation normal. Neck supple. Carotid bruit is not present.  Cardiovascular: Normal rate, regular rhythm, normal heart sounds and intact distal pulses.  Exam reveals no gallop and no friction rub.   No murmur heard. Pulmonary/Chest: Effort normal  and breath sounds normal. No respiratory distress. She has no wheezes. She has no rales.  Abdominal: Soft. Bowel sounds are normal. She exhibits no distension. There is no tenderness. There is no rebound and no guarding.  Genitourinary: Guaiac negative stool.       Brown stool  Musculoskeletal: Normal range of motion. She exhibits no edema and no tenderness.  Neurological: She is alert and oriented to person, place, and time. She has normal reflexes. No cranial nerve deficit. She exhibits normal muscle tone. Coordination normal. GCS eye subscore is 4. GCS verbal subscore is 5. GCS motor subscore is 6.  Skin: Skin is warm and dry. No  rash noted. She is not diaphoretic.  Psychiatric: She has a normal mood and affect.    ED Course  Procedures  EKG: Date: 01/26/2011  Rate: 110  Rhythm: sinus tachycardia  QRS Axis: normal  Intervals: normal  ST/T Wave abnormalities: normal  Conduction Disutrbances:none  Narrative Interpretation:   Old EKG Reviewed: unchanged   LABS / RADIOLOGY: Results for orders placed during the hospital encounter of 01/26/11  D-DIMER, QUANTITATIVE      Component Value Range   D-Dimer, Quant 1.62 (*) 0.00 - 0.48 (ug/mL-FEU)  TROPONIN I      Component Value Range   Troponin I <0.30  <0.30 (ng/mL)  URINALYSIS, ROUTINE W REFLEX MICROSCOPIC      Component Value Range   Color, Urine YELLOW  YELLOW    Appearance CLEAR  CLEAR    Specific Gravity, Urine 1.015  1.005 - 1.030    pH 6.0  5.0 - 8.0    Glucose, UA NEGATIVE  NEGATIVE (mg/dL)   Hgb urine dipstick NEGATIVE  NEGATIVE    Bilirubin Urine NEGATIVE  NEGATIVE    Ketones, ur NEGATIVE  NEGATIVE (mg/dL)   Protein, ur NEGATIVE  NEGATIVE (mg/dL)   Urobilinogen, UA 0.2  0.0 - 1.0 (mg/dL)   Nitrite NEGATIVE  NEGATIVE    Leukocytes, UA NEGATIVE  NEGATIVE   LIPASE, BLOOD      Component Value Range   Lipase 22  11 - 59 (U/L)  CBC      Component Value Range   WBC 18.0 (*) 4.0 - 10.5 (K/uL)   RBC 2.53 (*) 3.87 - 5.11 (MIL/uL)   Hemoglobin 6.7 (*) 12.0 - 15.0 (g/dL)   HCT 16.1 (*) 09.6 - 46.0 (%)   MCV 77.9 (*) 78.0 - 100.0 (fL)   MCH 26.5  26.0 - 34.0 (pg)   MCHC 34.0  30.0 - 36.0 (g/dL)   RDW 04.5 (*) 40.9 - 15.5 (%)   Platelets 318  150 - 400 (K/uL)  DIFFERENTIAL      Component Value Range   Neutrophils Relative 84 (*) 43 - 77 (%)   Neutro Abs 15.2 (*) 1.7 - 7.7 (K/uL)   Lymphocytes Relative 9 (*) 12 - 46 (%)   Lymphs Abs 1.6  0.7 - 4.0 (K/uL)   Monocytes Relative 7  3 - 12 (%)   Monocytes Absolute 1.2 (*) 0.1 - 1.0 (K/uL)   Eosinophils Relative 0  0 - 5 (%)   Eosinophils Absolute 0.0  0.0 - 0.7 (K/uL)   Basophils Relative 0  0 - 1  (%)   Basophils Absolute 0.0  0.0 - 0.1 (K/uL)   WBC Morphology MILD LEFT SHIFT (1-5% METAS, OCC MYELO, OCC BANDS)     RBC Morphology ROULEAUX    COMPREHENSIVE METABOLIC PANEL      Component Value Range   Sodium  125 (*) 135 - 145 (mEq/L)   Potassium 4.2  3.5 - 5.1 (mEq/L)   Chloride 93 (*) 96 - 112 (mEq/L)   CO2 22  19 - 32 (mEq/L)   Glucose, Bld 259 (*) 70 - 99 (mg/dL)   BUN 55 (*) 6 - 23 (mg/dL)   Creatinine, Ser 4.78  0.50 - 1.10 (mg/dL)   Calcium 9.3  8.4 - 29.5 (mg/dL)   Total Protein 5.9 (*) 6.0 - 8.3 (g/dL)   Albumin 2.3 (*) 3.5 - 5.2 (g/dL)   AST 14  0 - 37 (U/L)   ALT 15  0 - 35 (U/L)   Alkaline Phosphatase 60  39 - 117 (U/L)   Total Bilirubin 0.1 (*) 0.3 - 1.2 (mg/dL)   GFR calc non Af Amer 55 (*) >60 (mL/min)   GFR calc Af Amer >60  >60 (mL/min)   Dg Chest 2 View  01/26/2011  *RADIOLOGY REPORT*  Clinical Data: Syncope.  Dizziness.  Shortness of breath.  CHEST - 2 VIEW  Comparison: 03/29/2010  Findings: Heart size is normal.  Both lungs are clear.  No evidence of pleural effusion.  No mass or lymphadenopathy identified.  IMPRESSION: Stable exam.  No active disease.  Original Report Authenticated By: Danae Orleans, M.D.    ED COURSE / COORDINATION OF CARE: 09:37PM - HGB 6.7; performed rectal exam at this time. Hemoccult negative, control passed, chaperone present for exam. Blood transfusion ordered for the patient, as has been a CT angiogram chest to evaluate for PE given her elevated d-dimer and tachycardia. She is however stable hemodynamically at the moment with a heart rate of 100 and normal sinus rhythm and oxygen levels 100% on room air. I discussed the patient with Dr. Orvan Falconer of the admitting hospitalist service for admission and he is coming down to the emergency department to evaluate the patient.  MDM: GI bleed, pulmonary embolism, arrhythmia, electrolyte abnormality      I personally performed the services described in this documentation, which was scribed  in my presence. The recorded information has been reviewed and considered.    Felisa Bonier, MD 01/29/11 2017

## 2011-01-27 ENCOUNTER — Encounter (HOSPITAL_COMMUNITY): Payer: Self-pay | Admitting: Urgent Care

## 2011-01-27 ENCOUNTER — Inpatient Hospital Stay (HOSPITAL_COMMUNITY): Payer: Medicare HMO

## 2011-01-27 DIAGNOSIS — R933 Abnormal findings on diagnostic imaging of other parts of digestive tract: Secondary | ICD-10-CM

## 2011-01-27 DIAGNOSIS — D649 Anemia, unspecified: Secondary | ICD-10-CM

## 2011-01-27 DIAGNOSIS — R198 Other specified symptoms and signs involving the digestive system and abdomen: Secondary | ICD-10-CM

## 2011-01-27 LAB — MAGNESIUM: Magnesium: 1.8 mg/dL (ref 1.5–2.5)

## 2011-01-27 LAB — CBC
MCH: 27.2 pg (ref 26.0–34.0)
MCHC: 34.1 g/dL (ref 30.0–36.0)
MCV: 79.4 fL (ref 78.0–100.0)
Platelets: 267 10*3/uL (ref 150–400)
RBC: 3.83 MIL/uL — ABNORMAL LOW (ref 3.87–5.11)
RDW: 17.4 % — ABNORMAL HIGH (ref 11.5–15.5)

## 2011-01-27 LAB — GLUCOSE, CAPILLARY
Glucose-Capillary: 114 mg/dL — ABNORMAL HIGH (ref 70–99)
Glucose-Capillary: 147 mg/dL — ABNORMAL HIGH (ref 70–99)
Glucose-Capillary: 76 mg/dL (ref 70–99)
Glucose-Capillary: 93 mg/dL (ref 70–99)

## 2011-01-27 LAB — BASIC METABOLIC PANEL
BUN: 41 mg/dL — ABNORMAL HIGH (ref 6–23)
Calcium: 8.1 mg/dL — ABNORMAL LOW (ref 8.4–10.5)
Creatinine, Ser: 0.8 mg/dL (ref 0.50–1.10)
GFR calc Af Amer: >60 mL/min (ref >60)
GFR calc non Af Amer: >60 mL/min (ref >60)

## 2011-01-27 LAB — HEPATIC FUNCTION PANEL
Alkaline Phosphatase: 59 U/L (ref 39–117)
Bilirubin, Direct: <0.1 mg/dL (ref 0.0–0.3)
Total Bilirubin: 0.1 mg/dL — ABNORMAL LOW (ref 0.3–1.2)

## 2011-01-27 LAB — APTT: aPTT: 28 seconds (ref 24–37)

## 2011-01-27 LAB — CARDIAC PANEL(CRET KIN+CKTOT+MB+TROPI)
CK, MB: 3.5 ng/mL (ref 0.3–4.0)
CK, MB: 3.2 ng/mL (ref 0.3–4.0)
Total CK: 54 U/L (ref 7–177)

## 2011-01-27 LAB — PROTIME-INR: Prothrombin Time: 14.7 seconds (ref 11.6–15.2)

## 2011-01-27 MED ORDER — SODIUM CHLORIDE 0.9 % IV SOLN
Freq: Once | INTRAVENOUS | Status: AC
  Administered 2011-01-27: 500 mL via INTRAVENOUS

## 2011-01-27 MED ORDER — INSULIN GLARGINE 100 UNIT/ML ~~LOC~~ SOLN
7.0000 [IU] | Freq: Two times a day (BID) | SUBCUTANEOUS | Status: DC
  Administered 2011-01-27 – 2011-01-28 (×2): 7 [IU] via SUBCUTANEOUS

## 2011-01-27 MED ORDER — IOHEXOL 300 MG/ML  SOLN
100.0000 mL | Freq: Once | INTRAMUSCULAR | Status: AC | PRN
  Administered 2011-01-27: 100 mL via INTRAVENOUS

## 2011-01-27 MED ORDER — PEG 3350-KCL-NABCB-NACL-NASULF 236 G PO SOLR: 4000.0000 mL | Freq: Once | ORAL | Status: DC

## 2011-01-27 MED ORDER — INSULIN GLARGINE 100 UNIT/ML ~~LOC~~ SOLN
14.0000 [IU] | Freq: Two times a day (BID) | SUBCUTANEOUS | Status: DC
  Administered 2011-01-27 (×2): 14 [IU] via SUBCUTANEOUS
  Filled 2011-01-27: qty 3

## 2011-01-27 MED ORDER — BISACODYL 5 MG PO TBEC
5.0000 mg | DELAYED_RELEASE_TABLET | Freq: Two times a day (BID) | ORAL | Status: DC
  Administered 2011-01-27 – 2011-01-28 (×4): 5 mg via ORAL
  Filled 2011-01-27 (×2): qty 1

## 2011-01-27 MED ORDER — INSULIN GLARGINE 100 UNIT/ML ~~LOC~~ SOLN: 14.0000 [IU] | Freq: Two times a day (BID) | SUBCUTANEOUS | Status: DC

## 2011-01-27 MED ORDER — FLEET ENEMA 7-19 GM/118ML RE ENEM: 1.0000 | ENEMA | RECTAL | Status: DC | PRN

## 2011-01-27 MED ORDER — POTASSIUM CHLORIDE IN NACL 20-0.9 MEQ/L-% IV SOLN
INTRAVENOUS | Status: DC
  Administered 2011-01-28: 20 mL via INTRAVENOUS

## 2011-01-27 MED ORDER — ACETYLCYSTEINE 20 % IN SOLN: 600.0000 mg | Freq: Two times a day (BID) | RESPIRATORY_TRACT | Status: AC

## 2011-01-27 MED ORDER — DOCUSATE SODIUM 100 MG PO CAPS
100.0000 mg | ORAL_CAPSULE | Freq: Two times a day (BID) | ORAL | Status: DC
  Administered 2011-01-27 – 2011-01-28 (×5): 100 mg via ORAL
  Filled 2011-01-27 (×4): qty 1

## 2011-01-27 MED ORDER — SODIUM CHLORIDE 0.9 % IV SOLN
INTRAVENOUS | Status: DC
  Administered 2011-01-27: via INTRAVENOUS
  Filled 2011-01-27 (×2): qty 1000

## 2011-01-27 MED ORDER — ONDANSETRON HCL 4 MG/2ML IJ SOLN
4.0000 mg | Freq: Four times a day (QID) | INTRAMUSCULAR | Status: DC | PRN
  Administered 2011-01-29 – 2011-02-20 (×5): 4 mg via INTRAVENOUS
  Filled 2011-01-27 (×7): qty 2

## 2011-01-27 MED ORDER — SODIUM CHLORIDE 0.9 % IJ SOLN
INTRAMUSCULAR | Status: AC
  Filled 2011-01-27: qty 10

## 2011-01-27 MED ORDER — ACETAMINOPHEN 650 MG RE SUPP: 650.0000 mg | Freq: Four times a day (QID) | RECTAL | Status: DC | PRN

## 2011-01-27 MED ORDER — INSULIN ASPART 100 UNIT/ML ~~LOC~~ SOLN
0.0000 [IU] | SUBCUTANEOUS | Status: DC
  Administered 2011-01-27: 3 [IU] via SUBCUTANEOUS
  Administered 2011-01-27: 2 [IU] via SUBCUTANEOUS
  Administered 2011-01-27 – 2011-01-28 (×2): 3 [IU] via SUBCUTANEOUS

## 2011-01-27 MED ORDER — PANTOPRAZOLE SODIUM 40 MG IV SOLR
40.0000 mg | Freq: Two times a day (BID) | INTRAVENOUS | Status: DC
  Administered 2011-01-27 – 2011-01-29 (×6): 40 mg via INTRAVENOUS
  Filled 2011-01-27 (×4): qty 40

## 2011-01-27 MED ORDER — ONDANSETRON HCL 4 MG PO TABS: 4.0000 mg | ORAL_TABLET | Freq: Four times a day (QID) | ORAL | Status: DC | PRN

## 2011-01-27 MED ORDER — POLYETHYLENE GLYCOL 3350 17 G PO PACK
17.0000 g | PACK | Freq: Every day | ORAL | Status: DC
  Administered 2011-01-29 – 2011-01-31 (×3): 17 g via ORAL
  Administered 2011-02-01: 09:00:00 via ORAL
  Administered 2011-02-02 – 2011-02-04 (×3): 17 g via ORAL
  Filled 2011-01-27 (×7): qty 1

## 2011-01-27 MED ORDER — HYDROMORPHONE HCL 1 MG/ML IJ SOLN
0.5000 mg | INTRAMUSCULAR | Status: DC | PRN
  Administered 2011-01-27 – 2011-02-03 (×16): 0.5 mg via INTRAVENOUS
  Filled 2011-01-27 (×15): qty 1

## 2011-01-27 MED ORDER — INSULIN ASPART 100 UNIT/ML ~~LOC~~ SOLN
0.0000 [IU] | Freq: Every day | SUBCUTANEOUS | Status: DC
  Filled 2011-01-27: qty 3

## 2011-01-27 MED ORDER — PEG 3350-KCL-NABCB-NACL-NASULF 236 G PO SOLR
4000.0000 mL | Freq: Once | ORAL | Status: AC
  Administered 2011-01-27: 4000 mL via ORAL
  Filled 2011-01-27: qty 4000

## 2011-01-27 MED ORDER — OXYCODONE HCL 5 MG PO TABS
5.0000 mg | ORAL_TABLET | ORAL | Status: DC | PRN
  Administered 2011-01-27 – 2011-02-03 (×10): 5 mg via ORAL
  Filled 2011-01-27 (×8): qty 1

## 2011-01-27 MED ORDER — SENNOSIDES-DOCUSATE SODIUM 8.6-50 MG PO TABS
1.0000 | ORAL_TABLET | Freq: Every day | ORAL | Status: DC | PRN
  Administered 2011-02-07 – 2011-02-08 (×2): 1 via ORAL
  Filled 2011-01-27 (×2): qty 1

## 2011-01-27 MED ORDER — TRAZODONE HCL 50 MG PO TABS
25.0000 mg | ORAL_TABLET | Freq: Every evening | ORAL | Status: DC | PRN
  Administered 2011-01-27 – 2011-01-31 (×3): 25 mg via ORAL
  Filled 2011-01-27 (×2): qty 1

## 2011-01-27 MED ORDER — PEG 3350-KCL-NABCB-NACL-NASULF 236 G PO SOLR
4000.0000 mL | Freq: Once | ORAL | Status: DC
  Filled 2011-01-27: qty 4000

## 2011-01-27 MED ORDER — INSULIN GLARGINE 100 UNIT/ML ~~LOC~~ SOLN: 7.0000 [IU] | Freq: Two times a day (BID) | SUBCUTANEOUS | Status: DC

## 2011-01-27 MED ORDER — BISACODYL 10 MG RE SUPP: 10.0000 mg | RECTAL | Status: DC | PRN

## 2011-01-27 MED ORDER — POLYETHYLENE GLYCOL 3350 17 G PO PACK
17.0000 g | PACK | Freq: Every day | ORAL | Status: DC
  Administered 2011-01-27: 10:00:00 via ORAL
  Filled 2011-01-27: qty 1

## 2011-01-27 MED ORDER — ACETAMINOPHEN 325 MG PO TABS
650.0000 mg | ORAL_TABLET | Freq: Four times a day (QID) | ORAL | Status: DC | PRN
  Administered 2011-01-27 – 2011-02-05 (×7): 650 mg via ORAL
  Filled 2011-01-27 (×6): qty 2

## 2011-01-27 NOTE — H&P (Signed)
PCP:   [name] Overman, MD, MD   Chief Complaint:   Progressive weakness x 1 week then syncope today,  HPI: This is 77 year old African American lady who lives alone, but does have a home health aide, has been visiting the emergency room frequenly over the past week for progressive weakness, frontal headache and  abdominal pain   Pt has been to the ED on July 24, 26, 27, 28 and 29,   Had abdominal CT, July 24, which raised question of pancreatitis which does not correlate with her exam and pain that she localizes to lateral aspects of lower quadrants. Lab work has previously shown repeated hyponatremia, hypokalemia and hyperglycemia. Normal lipase. Hemoglobin has been in the range 10-11.  This afternoon the patient began to feel much weaker than usual and felt that he had and eventually collapsed. She does not long she was unconscious for and because she saw week it's unclear if she was truly unconscious. The patient was brought to the emergency room by EMS responded extremely weak, and blood work now reveals a drop in her hemoglobin to 6.7 a sudden rise in her BUN to notify and a rise in her white count to 18,000 the hospitalist service was called for possible GI bleed.  Patient denies a chest pain but she has been having persistent epigastric pain; she still having a headache and she reports she now feels still weak to walk she has had no fever cough or cold, no bloody or black stools, she has had no vomiting.  For some months now she has had nocturnal incontinence and is due for surgery Dr. Jerre Simon. She denies incontinence of stool.  Reiew of Systems:  The patient denies ever, weight loss,, vision loss, decreased hearing, hoarseness, chest pain,  dyspnea on exertion, peripheral edema, balance deficits, hemoptysis, abdominal pain, melena, hematochezia,  hematuria, incontinence, genital sores, muscle weakness, suspicious skin lesions, transient blindness, difficulty walking, depression, unusual  weight change, abnormal bleeding, enlarged lymph nodes, angioedema, and breast masses.  Past Medical History: Past Medical History  Diagnosis Date  . Diabetes mellitus   . Hypertension   . Reflux   . Right elbow pain     OTIF  . Dementia    Past Surgical History  Procedure Date  . Orif right hip 1999    [facility]  . Umbilical hernia repair 60 months old    [place]    Medications: Prior to Admission medications   Medication Sig Start Date End Date Taking? Authorizing Provider  amLODipine (NORVASC) 5 MG tablet Take 1 tablet (5 mg total) by mouth daily. Take one tablet by mouth once a day  12/08/10  Yes [name] Overman, MD  B-D ULTRAFINE III SHORT PEN 31G X 8 MM MISC USE AS DIRECTED. 11/29/10  Yes [name] Overman, MD  benazepril (LOTENSIN) 20 MG tablet Take 1 tablet (20 mg total) by mouth daily. 12/08/10 12/08/11 Yes [name] Overman, MD  Calcium Carbonate-Vitamin D (OYSTER SHELL CALCIUM/D) 500-200 MG-UNIT TABS TAKE (1) TABLET BY MOUTH ONCE DAILY. 10/24/10  Yes [name] Overman, MD  Chlorpheniramine-DM (CORICIDIN HBP COUGH/COLD PO) Take 2 tablets by mouth 2 (two) times daily. OTC    Yes Historical Provider, MD  esomeprazole (NEXIUM) 40 MG capsule Take 40 mg by mouth daily before breakfast. Take one tablet by mouth once a day   Yes Historical Provider, MD  glipiZIDE (GLUCOTROL) 10 MG tablet Take 1 tablet (10 mg total) by mouth 2 (two) times daily before a meal. 12/08/10  Yes [name] Overman,  MD  glucose blood (ONE TOUCH TEST STRIPS) test strip 1 each by Other route 3 (three) times daily. Use as directed three times a day    Yes Historical Provider, MD  HYDROcodone-acetaminophen (NORCO) 5-325 MG per tablet Take 1 tablet by mouth every 4 (four) hours as needed for pain. 01/24/11 02/03/11 Yes Nicoletta Dress. Colon Branch, MD  HYDROcodone-acetaminophen (VICODIN) 5-500 MG per tablet Take 1-2 tablets by mouth every 6 (six) hours as needed for pain. 01/20/11 01/30/11 Yes Sunnie Nielsen, MD  insulin glargine (LANTUS  SOLOSTAR) 100 UNIT/ML injection 14 Units 2 (two) times daily.  01/16/11  Yes [name] Overman, MD  lovastatin (MEVACOR) 10 MG tablet Take 1 tablet (10 mg total) by mouth at bedtime. 12/08/10  Yes [name] Overman, MD  metFORMIN (GLUCOPHAGE) 1000 MG tablet Take 1,000 mg by mouth 2 (two) times daily with a meal.   10/27/10  Yes [name] Overman, MD  Multiple Vitamin (MULTIVITAMIN) tablet Take 1 tablet by mouth daily.     Yes Historical Provider, MD  Multiple Vitamins-Minerals (MULTIVITAMIN WITH IRON-MINERALS) liquid Take 10 mLs by mouth daily.   01/23/11 01/23/12 Yes [name] Overman, MD  naproxen sodium (ANAPROX) 220 MG tablet Take 220 mg by mouth 2 (two) times daily with a meal. Pain    Yes Historical Provider, MD  temazepam (RESTORIL) 30 MG capsule Take 1 capsule (30 mg total) by mouth at bedtime as needed for sleep. 12/08/10  Yes [name] Overman, MD  benzonatate (TESSALON PERLES) 100 MG capsule Take 1 capsule (100 mg total) by mouth 3 (three) times daily as needed. Take one capsule by mouth three times a day 10/27/10   [name] Overman, MD  darifenacin (ENABLEX) 15 MG 24 hr tablet Take 7.5-15 mg by mouth daily.   12/24/10   [name] Overman, MD  fluticasone (FLONASE) 50 MCG/ACT nasal spray 1 spray by Nasal route daily. 10/27/10 10/27/11  [name] Overman, MD  HYDROXYZINE HCL PO Take 25 mg elemental calcium/kg/hr by mouth. Take one tablet by mouth at bedtime    Historical Provider, MD  ibuprofen (ADVIL,MOTRIN) 200 MG tablet Take 200 mg by mouth 2 (two) times daily as needed. OTC FOR Pain    Historical Provider, MD  Misc. Devices [facility]) MISC by Does not apply route.      Historical Provider, MD  omeprazole (PRILOSEC) 20 MG capsule Take 20 mg by mouth daily.     Historical Provider, MD  ondansetron (ZOFRAN) 4 MG tablet Take 1 tablet (4 mg total) by mouth every 6 (six) hours. 01/20/11 01/27/11  Sunnie Nielsen, MD    Allergies:   Allergies  Allergen Reactions  . Other Swelling    Pinto beans cause swelling  and hives  . Penicillins Swelling    Social History:  reports that she has never smoked. Her smokeless tobacco use includes Chew. She reports that she does not drink alcohol or use illicit drugs.  Family History: Family History  Problem Relation Age of Onset  . Cancer Mother     pelvic     Physical Exam: Filed Vitals:   01/26/11 2330 01/27/11 0020 01/27/11 0040 01/27/11 0143  BP: 138/82 138/89 135/85 145/83  Pulse: 111 86 91 85  Temp: 98.5 F (36.9 C) 98.5 F (36.9 C) 98.8 F (37.1 C) 98.3 F (36.8 C)  TempSrc: Oral Oral Oral Oral  Resp: 24 24 20 20   SpO2:    100%   General appearance: alert, cooperative, fatigued, mild distress and moderately obese Eyes: PERL; mm pale, dry. Throat: abnormal  findings: dry, pale. Neck: no adenopathy, no carotid bruit, no JVD, supple, symmetrical, trachea midline and thyroid not enlarged, symmetric, no tenderness/mass/nodules Back: symmetric, no curvature. ROM normal. No CVA tenderness. Resp: clear to auscultation bilaterally Chest wall: no tenderness Cardio: regular rate and rhythm, S1, S2 normal, no murmur, click, rub or gallop GI: epigastric tenderness; normal bowel sounds ; no masses Extremities: no edema, redness or tenderness in the calves or thighs Neurologic: Grossly normal Rectal exam: rubbery mass felt in posterior rectum; does not bleed; scant  brown stool, hemoccult negative.   Labs on Admission:   [facility] 01/27/11 0030 01/26/11 1952 01/24/11 0630  NA -- 125* 125*  K -- 4.2 3.2*  CL -- 93* 88*  CO2 -- 22 21  GLUCOSE -- 259* 130*  BUN -- 55* 12  CREATININE -- 0.98 0.64  CALCIUM -- 9.3 9.0  MG 1.8 -- --  PHOS -- -- --    Basename 01/27/11 0030 01/26/11 1952  AST 15 14  ALT 15 15  ALKPHOS 59 60  BILITOT 0.1* 0.1*  PROT 5.9* 5.9*  ALBUMIN 2.4* 2.3*    Basename 01/26/11 1952  LIPASE 22  AMYLASE --    Basename 01/26/11 2149 01/26/11 1952 01/24/11 0630  WBC -- 18.0* 10.5  NEUTROABS -- 15.2* 7.9*  HGB 6.7*  6.7* --  HCT 19.9* 19.7* --  MCV -- 77.9* 77.9*  PLT -- 318 327    Basename 01/26/11 1952  CKTOTAL --  CKMB --  CKMBINDEX --  TROPONINI <0.30   No results found for this basename: TSH,T4TOTAL,FREET3,T3FREE,THYROIDAB in the last 72 hours No results found for this basename: VITAMINB12:2,FOLATE:2,FERRITIN:2,TIBC:2,IRON:2,RETICCTPCT:2 in the last 72 hours  Radiological Exams on Admission: Dg Chest 2 View  01/26/2011  *RADIOLOGY REPORT*  Clinical Data: Syncope.  Dizziness.  Shortness of breath.  CHEST - 2 VIEW  Comparison: 03/29/2010  Findings: Heart size is normal.  Both lungs are clear.  No evidence of pleural effusion.  No mass or lymphadenopathy identified.  IMPRESSION: Stable exam.  No active disease.  Original Report Authenticated By: Danae Orleans, M.D.   Ct Head Wo Contrast  01/25/2011  *RADIOLOGY REPORT*  Clinical Data: Severe headache.  Dementia.  CT HEAD WITHOUT CONTRAST  Technique:  Contiguous axial images were obtained from the base of the skull through the vertex without contrast.  Comparison: 03/30/2009  Findings: There is no evidence of intracranial hemorrhage, brain edema or other signs of acute infarction.  There is no evidence of intracranial mass lesion or mass effect.  No abnormal extra-axial fluid collections are identified.  Moderate to severe cerebellar atrophy and mild cerebral atrophy are stable in appearance. Moderate chronic small vessel disease is also unchanged.  No evidence of hydrocephalus.  No skull abnormality identified.  IMPRESSION:  1.  No acute intracranial abnormality. 2.  Stable cerebral and cerebellar atrophy and chronic small vessel disease.  Original Report Authenticated By: Danae Orleans, M.D.   Ct Angio Chest W/cm &/or Wo Cm  01/26/2011  *RADIOLOGY REPORT*  Clinical Data:  Short of breath. Tachycardia.  Syncope.  Elevated D- dimer.  CT ANGIOGRAPHY CHEST WITH CONTRAST  Technique:  Multidetector CT imaging of the chest was performed using the standard  protocol during bolus administration of intravenous contrast.  Multiplanar CT image reconstructions including MIPs were obtained to evaluate the vascular anatomy.  Contrast:  100 ml Omnipaque-300  Comparison:  None.  Findings:  Satisfactory opacification of the pulmonary arteries noted, and there is no evidence of pulmonary  emboli.  No evidence of thoracic aortic aneurysm or dissection. Diffuse coronary artery calcification noted.  No mediastinal or hilar masses are identified.  No adenopathy seen elsewhere within the thorax.  No evidence of pleural or pericardial effusion.  Both lungs are clear.  No evidence of pulmonary infiltrate or mass.  No endobronchial lesion identified.  Review of the MIP images confirms the above findings.  IMPRESSION:  1.  No evidence of pulmonary embolism or other acute findings. 2.  Diffuse coronary artery calcification noted.  Original Report Authenticated By: Danae Orleans, M.D.   Ct Abdomen Pelvis W Contrast  01/20/2011  *RADIOLOGY REPORT*  Clinical Data: Abdominal pain, hernia repair, right renal angiomyolipoma  CT ABDOMEN AND PELVIS WITH CONTRAST  Technique:  Multidetector CT imaging of the abdomen and pelvis was performed following the standard protocol during bolus administration of intravenous contrast.  Contrast: 100 ml Omnipaque-300 IV  Comparison: CT abdomen dated 02/18/2008  Findings: Minimal dependent atelectasis lung bases.  Liver, spleen and adrenal glands within normal limits.  Mild soft tissue stranding anterior to the pancreatic head (series 2/image 30).  Gallbladder is unremarkable. No intrahepatic ductal dilatation. Common bile duct measures 8 mm.  Tiny bilateral renal cysts.  Right upper pole angiomyolipoma.  No evidence of bowel obstruction.  Normal appendix.  Colonic diverticulosis, without associated inflammatory changes.  Moderate stool in the rectum.  No abdominopelvic ascites.  No suspicious abdominopelvic lymphadenopathy.  Uterus unremarkable.  No adnexal  masses.  Bladder is within normal limits.  Degenerative changes of the visualized thoracolumbar spine. Right hip arthroplasty.  IMPRESSION: Mild soft tissue stranding anterior to the pancreatic head, correlate for pancreatitis.  Normal appendix.  No evidence of bowel obstruction.  Additional ancillary findings as above.  Original Report Authenticated By: Charline Bills, M.D.    Assessment Present on Admission:  .Anemia, microcytic, acute-on chronic, likely due to acute- on-chronic bleed. .Rectal mass .Syncope and collapse likely secondary to acute anemia.  Hyponatremia  .DIABETES MELLITUS, TYPE II .HYPERTENSION .HEADACHES, HX OF  Plan Will admit to telemetry hydrate transfuse her up towards a hemoglobin of 10 and monitor her hemoglobin and keep that in reserve and consult cardiologist for assistance with management. Her elevated BUN/creatinine ratio and upper GI bleed but since the mass was felt on rectal exam she has probable upper and lower GI and will benefit from a prone lower endoscopy  We'll put on a clear liquid diet continue her Lantus and give sliding scale insulin; we'll discontinue metformin and glyburide for now. We'll monitor her blood pressure and treat as necessary.  Other plans as per orders.  [name] 01/27/2011, 1:53 AM

## 2011-01-27 NOTE — Consult Note (Signed)
Referring Provider: Dr. Vania Rea Primary Care Physician:  [name], MD, MD Primary Gastroenterologist:  Dr. Darrick Penna  Reason for Consultation:  Anemia  HPI: [name] is a 77 y.o. black female who states she passed out yesterday after going to visit a neighbor.  Admitted w/ anemia & hgb 6-range.  S/p 2 units PRBCs & hgb 9 grams this AM.  C/o "feeling bad" with anorexia, dizziness, weak legs & insomnia x 1 week.  C/o abdominal pain right behind umbilicus x 1 week.  Pain worse w/ eating.   Pain 8/10 & intermittent & lasts few minutes.  Vomited once last week.  Notes BM QOD.  Hx incontinence & unsure whether she has had melena or rectal bleeding.  C/o heartburn & indigestion QOD.  Takes nexium 40mg  daily.  Takes Advil 200-400mg  almost every day.  Denies Goodys or BC powders.  Hx severe ulcerative esophagitis on EGD in 2009.  Hx focal pancreatitis HOP & elevated gastrin levels 2009.  Pt was supposed to have follow-up gastrin levels & pancreatic protocol CT with [place], but pt does not believe she ever did.  Past Medical History  Diagnosis Date  . Diabetes mellitus   . Hypertension   . Reflux   . Right elbow pain     OTIF  . Dementia   . Depression   . Osteoarthritis   . Pancreatitis 11/2007    HOP  . Angiomyolipoma of kidney     right  . Ulcerative esophagitis 12/05/2007    hx elevated gastrin, severe on EGD by Dr Jena Gauss , h pylori negative  . Hiatal hernia   . S/P colonoscopy 2009    pt reports normal by Dr Lovell Sheehan    Past Surgical History  Procedure Date  . Orif right hip 1999    [facility]  . Umbilical hernia repair 54 months old    [place]    Prior to Admission medications   Medication Sig Start Date End Date Taking? Authorizing Provider  amLODipine (NORVASC) 5 MG tablet Take 1 tablet (5 mg total) by mouth daily. Take one tablet by mouth once a day  12/08/10  Yes [name], MD  B-D ULTRAFINE III SHORT PEN 31G X 8 MM MISC USE AS DIRECTED. 11/29/10  Yes [name], MD  benazepril (LOTENSIN) 20 MG tablet Take 1 tablet (20 mg total) by mouth daily. 12/08/10 12/08/11 Yes [name], MD  Calcium Carbonate-Vitamin D (OYSTER SHELL CALCIUM/D) 500-200 MG-UNIT TABS TAKE (1) TABLET BY MOUTH ONCE DAILY. 10/24/10  Yes [name], MD  Chlorpheniramine-DM (CORICIDIN HBP COUGH/COLD PO) Take 2 tablets by mouth 2 (two) times daily. OTC    Yes Historical Provider, MD  esomeprazole (NEXIUM) 40 MG capsule Take 40 mg by mouth daily before breakfast. Take one tablet by mouth once a day   Yes Historical Provider, MD  glipiZIDE (GLUCOTROL) 10 MG tablet Take 1 tablet (10 mg total) by mouth 2 (two) times daily before a meal. 12/08/10  Yes [name], MD  glucose blood (ONE TOUCH TEST STRIPS) test strip 1 each by Other route 3 (three) times daily. Use as directed three times a day    Yes Historical Provider, MD  HYDROcodone-acetaminophen (NORCO) 5-325 MG per tablet Take 1 tablet by mouth every 4 (four) hours as needed for pain. 01/24/11 02/03/11 Yes Nicoletta Dress. Colon Branch, MD  HYDROcodone-acetaminophen (VICODIN) 5-500 MG per tablet Take 1-2 tablets by mouth every 6 (six) hours as needed for pain. 01/20/11 01/30/11 Yes Sunnie Nielsen, MD  insulin glargine (LANTUS SOLOSTAR) 100 UNIT/ML injection 14 Units 2 (two) times daily.  01/16/11  Yes [name], MD  lovastatin (MEVACOR) 10 MG tablet Take 1 tablet (10 mg total) by mouth at bedtime. 12/08/10  Yes [name], MD  metFORMIN (GLUCOPHAGE) 1000 MG tablet Take 1,000 mg by mouth 2 (two) times daily with a meal.   10/27/10  Yes [name], MD  Multiple Vitamin (MULTIVITAMIN) tablet Take 1 tablet by mouth daily.     Yes Historical Provider, MD  Multiple Vitamins-Minerals (MULTIVITAMIN WITH IRON-MINERALS) liquid Take 10 mLs by mouth daily.   01/23/11 01/23/12 Yes [name], MD  naproxen sodium (ANAPROX) 220 MG tablet Take 220 mg by mouth 2 (two) times daily with a meal. Pain    Yes Historical Provider, MD  temazepam  (RESTORIL) 30 MG capsule Take 1 capsule (30 mg total) by mouth at bedtime as needed for sleep. 12/08/10  Yes [name], MD  benzonatate (TESSALON PERLES) 100 MG capsule Take 1 capsule (100 mg total) by mouth 3 (three) times daily as needed. Take one capsule by mouth three times a day 10/27/10   [name], MD  darifenacin (ENABLEX) 15 MG 24 hr tablet Take 7.5-15 mg by mouth daily.   12/24/10   [name], MD  fluticasone (FLONASE) 50 MCG/ACT nasal spray 1 spray by Nasal route daily. 10/27/10 10/27/11  [name], MD  HYDROXYZINE HCL PO Take 25 mg elemental calcium/kg/hr by mouth. Take one tablet by mouth at bedtime    Historical Provider, MD  ibuprofen (ADVIL,MOTRIN) 200 MG tablet Take 200 mg by mouth 2 (two) times daily as needed. OTC FOR Pain    Historical Provider, MD  Misc. Devices [facility]) MISC by Does not apply route.      Historical Provider, MD  omeprazole (PRILOSEC) 20 MG capsule Take 20 mg by mouth daily.     Historical Provider, MD  ondansetron (ZOFRAN) 4 MG tablet Take 1 tablet (4 mg total) by mouth every 6 (six) hours. 01/20/11 01/27/11  Sunnie Nielsen, MD    Current Facility-Administered Medications  Medication Dose Route Frequency Provider Last Rate Last Dose  . 0.9 %  sodium chloride infusion   Intravenous Once Felisa Bonier, MD 100 mL/hr at 01/26/11 2210    . acetaminophen (TYLENOL) tablet 650 mg  650 mg Oral Q6H PRN Vania Rea       Or  . acetaminophen (TYLENOL) suppository 650 mg  650 mg Rectal Q6H PRN Vania Rea      . bisacodyl (DULCOLAX) suppository 10 mg  10 mg Rectal Q48H PRN Vania Rea      . docusate sodium (COLACE) capsule 100 mg  100 mg Oral BID Vania Rea   100 mg at 01/27/11 0050  . HYDROmorphone (DILAUDID) injection 0.5 mg  0.5 mg Intravenous Once Felisa Bonier, MD   0.5 mg at 01/26/11 1945  . HYDROmorphone (DILAUDID) injection 0.5 mg  0.5 mg Intravenous Q2H PRN Vania Rea   0.5 mg at 01/27/11 0422  . insulin  aspart (novoLOG) injection 0-15 Units  0-15 Units Subcutaneous Q4H Vania Rea   3 Units at 01/27/11 0429  . insulin aspart (novoLOG) injection 0-5 Units  0-5 Units Subcutaneous QHS Vania Rea      . insulin glargine (LANTUS) injection 14 Units  14 Units Subcutaneous BID Vania Rea   14 Units at 01/27/11 0051  . iohexol (OMNIPAQUE) 350 MG/ML injection 100 mL  100 mL Intravenous Once PRN Medication Radiologist   100  mL at 01/26/11 2207  . ondansetron (ZOFRAN) injection 4 mg  4 mg Intravenous Once Felisa Bonier, MD   4 mg at 01/26/11 1945  . ondansetron (ZOFRAN) tablet 4 mg  4 mg Oral Q6H PRN Vania Rea       Or  . ondansetron [facility]) injection 4 mg  4 mg Intravenous Q6H PRN Vania Rea      . oxyCODONE (Oxy IR/ROXICODONE) immediate release tablet 5 mg  5 mg Oral Q4H PRN Vania Rea      . pantoprazole (PROTONIX) injection 40 mg  40 mg Intravenous Q12H Leopold Campbell   40 mg at 01/27/11 0051  . polyethylene glycol (MIRALAX / GLYCOLAX) packet 17 g  17 g Oral Daily Vania Rea      . senna-docusate (Senokot-S) tablet 1 tablet  1 tablet Oral Daily PRN Vania Rea      . sodium chloride 0.9 % 1,000 mL with potassium chloride 20 mEq infusion   Intravenous Continuous Vania Rea 75 mL/hr at 01/27/11 0015    . sodium phosphate (FLEET) 7-19 GM/118ML enema 1 enema  1 enema Rectal Q48H PRN Vania Rea      . traZODone (DESYREL) tablet 25 mg  25 mg Oral QHS PRN Vania Rea      . DISCONTD: sodium chloride 0.9 % bolus 1,000 mL  1,000 mL Intravenous Once Felisa Bonier, MD      . DISCONTD: sodium chloride 0.9 % injection             Allergies as of 01/26/2011 - Review Complete 01/26/2011  Allergen Reaction Noted  . Other Swelling 01/22/2011  . Penicillins Swelling 09/13/2007    Family History:There is no known family history of colorectal carcinoma , liver disease, or inflammatory bowel disease.  Problem Relation Age of Onset  . Cancer  Mother     pelvic     History   Social History  . Marital Status: Widowed    Spouse Name: N/A    Number of Children: 1  . Years of Education: N/A   Occupational History  . retired     Social History Main Topics  . Smoking status: Never Smoker   . Smokeless tobacco: Current User    Types: Chew  . Alcohol Use: No     Hx of Alcohol dependecy   . Drug Use: No  . Sexually Active: No  Review of Systems: Gen: c/o profound weakness & malaise CV: Denies chest pain, angina, palpitations, syncope, orthopnea, PND, peripheral edema, and claudication. Resp: Some dyspnea with exercise.  Denies cough, sputum, wheezing, coughing up blood, and pleurisy. GI: Denies vomiting blood, jaundice.   Denies dysphasia or odynophagia. GU : Denies urinary burning, blood in urine, urinary frequency, urinary hesitancy, nocturnal urination, and urinary incontinence. MS: Denies joint pain, limitation of movement, and swelling, stiffness, low back pain, extremity pain. Denies muscle weakness, cramps, atrophy.  Derm: Denies rash, itching, dry skin, hives, moles, warts, or unhealing ulcers.  Psych: Denies depression, anxiety, memory loss, suicidal ideation, hallucinations, paranoia, and confusion. Heme: Denies bruising, bleeding, and enlarged lymph nodes.  Physical Exam: Vital signs in last 24 hours: Temp:  [98 F (36.7 C)-99.2 F (37.3 C)] 98.4 F (36.9 C) (07/31 0800) Pulse Rate:  [85-111] 97  (07/31 0800) Resp:  [18-25] 19  (07/31 0800) BP: (110-145)/(58-89) 125/82 mmHg (07/31 0800) SpO2:  [96 %-100 %] 100 % (07/31 0143) Last BM Date: 01/25/11 General:   Alert,  Well-developed,obese, pleasant and cooperative in NAD Head:  Normocephalic and atraumatic. Eyes:  Sclera clear, no icterus.   Conjunctiva pink. Ears:  Normal auditory acuity. Nose:  No deformity, discharge,  or lesions. Mouth:  No deformity or lesions, dentition normal. Neck:  Supple; no masses or thyromegaly. Lungs:  Clear throughout to  auscultation.   No wheezes, crackles, or rhonchi. No acute distress. Heart:  Regular rate and rhythm; no murmurs, clicks, rubs,  or gallops. Abdomen:  Obese, Soft, nontender and nondistended. No masses, hepatosplenomegaly or hernias noted. Normal bowel sounds, without guarding, and without rebound.   Rectal:  No internal or external lesions noted.  Small amt hard brown stool obtained from vault was HEME NEGATIVE. Msk:  Symmetrical without gross deformities. Normal posture. Pulses:  Normal pulses noted. Extremities:  With clubbing & trace edema. Neurologic:  Alert and  oriented x4;  grossly normal neurologically. Skin:  Intact without significant lesions or rashes. Cervical Nodes:  No significant cervical adenopathy. Psych:  Alert and cooperative. Normal mood and affect.  Lab Results:  [facility] 01/27/11 0857 01/26/11 2149 01/26/11 1952  WBC 13.5* -- 18.0*  HGB 9.0* 6.7* 6.7*  HCT 26.4* 19.9* 19.7*  PLT 279 -- 318   BMET  Basename 01/26/11 1952  NA 125*  K 4.2  CL 93*  CO2 22  GLUCOSE 259*  BUN 55*  CREATININE 0.98  CALCIUM 9.3   LFT  Basename 01/27/11 0030  PROT 5.9*  ALBUMIN 2.4*  AST 15  ALT 15  ALKPHOS 59  BILITOT 0.1*  BILIDIR <0.1  IBILI NOT CALCULATED   PT/INR  Basename 01/27/11 0030  LABPROT 14.7  INR 1.13   Studies/Results:  CT ABD/Pelvis 01/20/11 w/ IV contrast:  Mild soft tissue stranding anterior to the pancreatic head, correlate for pancreatitis.   Normal appendix. No evidence of bowel obstruction.  01/26/2011 CHEST - 2 VIEW  Findings: Heart size is normal.  Both lungs are clear.  No evidence of pleural effusion.  No mass or lymphadenopathy identified.  IMPRESSION: Stable exam.  No active disease.  Original Report Authenticated By: Danae Orleans, M.D.   Ct Head Wo Contrast:  01/25/2011  Findings: There is no evidence of intracranial hemorrhage, brain edema or other signs of acute infarction.  There is no evidence of intracranial mass lesion or mass  effect.  No abnormal extra-axial fluid collections are identified.  Moderate to severe cerebellar atrophy and mild cerebral atrophy are stable in appearance. Moderate chronic small vessel disease is also unchanged.  No evidence of hydrocephalus.  No skull abnormality identified.  IMPRESSION:  1.  No acute intracranial abnormality. 2.  Stable cerebral and cerebellar atrophy and chronic small vessel disease.  Original Report Authenticated By: Danae Orleans, M.D.   Ct Angio Chest W/cm &/or Wo Cm:  01/26/2011 IMPRESSION:  1.  No evidence of pulmonary embolism or other acute findings. 2.  Diffuse coronary artery calcification noted.  Original Report Authenticated By: Danae Orleans, M.D.    Impression: LYNDSAY TALAMANTE is a 77 y.o. black female w/ profound anemia without evidence of GI bleed at this time.  She may have intermittent GI bleeding & is on OTC advil prn.  She has chronic subtle changes about the head of the pancreas ? focal pancreatitis vs. Small mass.  She does have hx severe erosive esophagitis & elevated gastrin levels.  Will check hemoccults to r/o GI bleed & proceed w/ EGD. Rectal mass palpated by Dr Orvan Falconer will need further evaluation with colonoscopy in near future.    Plan:  Follow H/H & agree w/ transfusion to keep hgb around 9 grams Hemoccult stools x 3 EGD & colonoscopy w/ Dr Darrick Penna in AM Agree w/ protonix 40mg  BID Will need further imaging pancreas via pancreatic protocol CT Will need recheck gastrin levels   LOS: 1 day   Lorenza Burton  01/27/2011, 9:23 AM

## 2011-01-27 NOTE — Consult Note (Signed)
RMR PT SINCE 2009  Pt denied BRBPR, hematemesis, NV, diarrhea, heartburn/indigestion, trouble swallowing, or weight loss. Rare abd pain. Normocytic anemia, no evidence of overt GIB, nl creatinine. ?NL TCS 2009. TCS/?EGD ON 8/1. ELEVATED GASTRIN IN 2009. Recheck today, CT pancreatic protocol.

## 2011-01-27 NOTE — Progress Notes (Signed)
Subjective: Feeling better today. Denies any pain. No specific complaints. Objective: Vital signs in last 24 hours: Filed Vitals:   01/27/11 0500 01/27/11 0600 01/27/11 0653 01/27/11 0800  BP: 113/80 110/74 125/77 125/82  Pulse: 96 98 86 97  Temp: 98.3 F (36.8 C) 98.4 F (36.9 C) 98.9 F (37.2 C) 98.4 F (36.9 C)  TempSrc: Oral Oral Oral Oral  Resp: 20 20 20 19   SpO2:       Weight change:   Intake/Output Summary (Last 24 hours) at 01/27/11 1001 Last data filed at 01/27/11 0020  Gross per 24 hour  Intake   1500 ml  Output      0 ml  Net   1500 ml   Physical Exam: General: Awake, Oriented, No acute distress. HEENT: EOMI. Neck: Supple CV: S1 and S2 Lungs: Clear to ascultation bilaterally Abdomen: Soft, Nontender, Nondistended, +bowel sounds. Ext: Good pulses. Trace edema.  Lab Results:  [facility] 01/27/11 0857 01/27/11 0030 01/26/11 1952  NA 130* -- 125*  K 3.8 -- 4.2  CL 97 -- 93*  CO2 19 -- 22  GLUCOSE 235* -- 259*  BUN 41* -- 55*  CREATININE 0.80 -- 0.98  CALCIUM 8.1* -- 9.3  MG -- 1.8 --  PHOS -- -- --    Basename 01/27/11 0030 01/26/11 1952  AST 15 14  ALT 15 15  ALKPHOS 59 60  BILITOT 0.1* 0.1*  PROT 5.9* 5.9*  ALBUMIN 2.4* 2.3*    Basename 01/26/11 1952  LIPASE 22  AMYLASE --    Basename 01/27/11 0857 01/26/11 2149 01/26/11 1952  WBC 13.5* -- 18.0*  NEUTROABS -- -- 15.2*  HGB 9.0* 6.7* --  HCT 26.4* 19.9* --  MCV 81.5 -- 77.9*  PLT 279 -- 318    Basename 01/27/11 0858 01/26/11 1952  CKTOTAL 54 --  CKMB 3.5 --  CKMBINDEX -- --  TROPONINI <0.30 <0.30   No results found for this basename: POCBNP:3 in the last 72 hours  Basename 01/26/11 1952  DDIMER 1.62*   No results found for this basename: HGBA1C:2 in the last 72 hours No results found for this basename: CHOL:2,HDL:2,LDLCALC:2,TRIG:2,CHOLHDL:2,LDLDIRECT:2 in the last 72 hours No results found for this basename: TSH,T4TOTAL,FREET3,T3FREE,THYROIDAB in the last 72 hours No  results found for this basename: VITAMINB12:2,FOLATE:2,FERRITIN:2,TIBC:2,IRON:2,RETICCTPCT:2 in the last 72 hours Micro Results: Recent Results (from the past 240 hour(s))  SURGICAL PCR SCREEN     Status: Normal   Collection Time   01/23/11 11:30 AM      Component Value Range Status Comment   MRSA, PCR NEGATIVE  NEGATIVE  Final    Staphylococcus aureus NEGATIVE  NEGATIVE  Final    Studies/Results: Dg Chest 2 View  01/26/2011  *RADIOLOGY REPORT*  Clinical Data: Syncope.  Dizziness.  Shortness of breath.  CHEST - 2 VIEW  Comparison: 03/29/2010  Findings: Heart size is normal.  Both lungs are clear.  No evidence of pleural effusion.  No mass or lymphadenopathy identified.  IMPRESSION: Stable exam.  No active disease.  Original Report Authenticated By: Danae Orleans, M.D.   Ct Head Wo Contrast  01/25/2011  *RADIOLOGY REPORT*  Clinical Data: Severe headache.  Dementia.  CT HEAD WITHOUT CONTRAST  Technique:  Contiguous axial images were obtained from the base of the skull through the vertex without contrast.  Comparison: 03/30/2009  Findings: There is no evidence of intracranial hemorrhage, brain edema or other signs of acute infarction.  There is no evidence of intracranial mass lesion or mass effect.  No abnormal extra-axial fluid collections are identified.  Moderate to severe cerebellar atrophy and mild cerebral atrophy are stable in appearance. Moderate chronic small vessel disease is also unchanged.  No evidence of hydrocephalus.  No skull abnormality identified.  IMPRESSION:  1.  No acute intracranial abnormality. 2.  Stable cerebral and cerebellar atrophy and chronic small vessel disease.  Original Report Authenticated By: Danae Orleans, M.D.   Ct Angio Chest W/cm &/or Wo Cm  01/26/2011  *RADIOLOGY REPORT*  Clinical Data:  Short of breath. Tachycardia.  Syncope.  Elevated D- dimer.  CT ANGIOGRAPHY CHEST WITH CONTRAST  Technique:  Multidetector CT imaging of the chest was performed using the  standard protocol during bolus administration of intravenous contrast.  Multiplanar CT image reconstructions including MIPs were obtained to evaluate the vascular anatomy.  Contrast:  100 ml Omnipaque-300  Comparison:  None.  Findings:  Satisfactory opacification of the pulmonary arteries noted, and there is no evidence of pulmonary emboli.  No evidence of thoracic aortic aneurysm or dissection. Diffuse coronary artery calcification noted.  No mediastinal or hilar masses are identified.  No adenopathy seen elsewhere within the thorax.  No evidence of pleural or pericardial effusion.  Both lungs are clear.  No evidence of pulmonary infiltrate or mass.  No endobronchial lesion identified.  Review of the MIP images confirms the above findings.  IMPRESSION:  1.  No evidence of pulmonary embolism or other acute findings. 2.  Diffuse coronary artery calcification noted.  Original Report Authenticated By: Danae Orleans, M.D.   Ct Abdomen Pelvis W Contrast  01/20/2011  *RADIOLOGY REPORT*  Clinical Data: Abdominal pain, hernia repair, right renal angiomyolipoma  CT ABDOMEN AND PELVIS WITH CONTRAST  Technique:  Multidetector CT imaging of the abdomen and pelvis was performed following the standard protocol during bolus administration of intravenous contrast.  Contrast: 100 ml Omnipaque-300 IV  Comparison: CT abdomen dated 02/18/2008  Findings: Minimal dependent atelectasis lung bases.  Liver, spleen and adrenal glands within normal limits.  Mild soft tissue stranding anterior to the pancreatic head (series 2/image 30).  Gallbladder is unremarkable. No intrahepatic ductal dilatation. Common bile duct measures 8 mm.  Tiny bilateral renal cysts.  Right upper pole angiomyolipoma.  No evidence of bowel obstruction.  Normal appendix.  Colonic diverticulosis, without associated inflammatory changes.  Moderate stool in the rectum.  No abdominopelvic ascites.  No suspicious abdominopelvic lymphadenopathy.  Uterus unremarkable.  No  adnexal masses.  Bladder is within normal limits.  Degenerative changes of the visualized thoracolumbar spine. Right hip arthroplasty.  IMPRESSION: Mild soft tissue stranding anterior to the pancreatic head, correlate for pancreatitis.  Normal appendix.  No evidence of bowel obstruction.  Additional ancillary findings as above.  Original Report Authenticated By: Charline Bills, M.D.   Medications: I have reviewed the patient's current medications. Scheduled Meds:   . sodium chloride   Intravenous Once  . docusate sodium  100 mg Oral BID  . HYDROmorphone  0.5 mg Intravenous Once  . insulin aspart  0-15 Units Subcutaneous Q4H  . insulin aspart  0-5 Units Subcutaneous QHS  . insulin glargine  14 Units Subcutaneous BID  . ondansetron  4 mg Intravenous Once  . pantoprazole (PROTONIX) IV  40 mg Intravenous Q12H  . polyethylene glycol  17 g Oral Daily  . DISCONTD: sodium chloride  1,000 mL Intravenous Once  . DISCONTD: sodium chloride       Continuous Infusions:   . sodium chloride 0.9 % 1,000 mL with potassium chloride  20 mEq infusion 75 mL/hr at 01/27/11 0015   PRN Meds:.acetaminophen, acetaminophen, bisacodyl, HYDROmorphone, iohexol, ondansetron (ZOFRAN) IV, ondansetron, oxyCODONE, senna-docusate, sodium phosphate, traZODone Assessment/Plan: 1. Syncope and collapse likely due to anemia. Improved with IV hydration and blood transfusion. Troponins negative x2. CT chest negative for pulmonary embolism.  2. Severe anemia. Status post 2 units of PRBC. Hemoglobin improved to 9.0 after transfusion from 6.7. GI Dr. Darrick Penna has been consulted. Currently on a clear liquid diet. Cannot send for anemia panel if the patient has received to units of blood. Continue to tend CBC. Has never had a colonoscopy per patient. On IV pantoprazole. Holding NSAIDs.  3. Hyponatremia. Etiology unclear resolved with IV hydration continue to monitor. May be due to benazepril.  4. Type 2 diabetes currently on Lantus and  sliding scale insulin. Blood sugar stable. Pulling metformin and glipizide.  5. Leukocytosis. Likely reactive. UA negative. CT chest negative for infectious etiology.  6. GERD. On IV pantoprazole.  7. Hypertension. Holding home antihypertensive medications. If blood pressure becomes elevated continue restarting her medication. Patient on amlodipine and benazepril at home.  8. Hyperlipidemia. Holding lovastatin for the time being.  9. Prophylaxis. SCDs for DVT prophylaxis no heparin given severe anemia.  10. Disposition pending.   LOS: 1 day   Shayon Trompeter A 01/27/2011, 10:01 AM

## 2011-01-28 ENCOUNTER — Other Ambulatory Visit: Payer: Self-pay | Admitting: Gastroenterology

## 2011-01-28 ENCOUNTER — Ambulatory Visit: Payer: Medicare HMO | Admitting: Family Medicine

## 2011-01-28 ENCOUNTER — Encounter (HOSPITAL_COMMUNITY): Payer: Self-pay | Admitting: *Deleted

## 2011-01-28 ENCOUNTER — Encounter (HOSPITAL_COMMUNITY)
Admission: EM | Disposition: A | Discharge status: Skilled Nursing Facility | Payer: Self-pay | Source: Home / Self Care | Attending: Internal Medicine

## 2011-01-28 DIAGNOSIS — K760 Fatty (change of) liver, not elsewhere classified: Secondary | ICD-10-CM | POA: Diagnosis not present

## 2011-01-28 DIAGNOSIS — E871 Hypo-osmolality and hyponatremia: Secondary | ICD-10-CM | POA: Diagnosis present

## 2011-01-28 DIAGNOSIS — K259 Gastric ulcer, unspecified as acute or chronic, without hemorrhage or perforation: Secondary | ICD-10-CM

## 2011-01-28 DIAGNOSIS — K5730 Diverticulosis of large intestine without perforation or abscess without bleeding: Secondary | ICD-10-CM

## 2011-01-28 DIAGNOSIS — D126 Benign neoplasm of colon, unspecified: Secondary | ICD-10-CM

## 2011-01-28 DIAGNOSIS — D649 Anemia, unspecified: Secondary | ICD-10-CM

## 2011-01-28 HISTORY — PX: COLONOSCOPY: SHX5424

## 2011-01-28 HISTORY — PX: ESOPHAGOGASTRODUODENOSCOPY: SHX5428

## 2011-01-28 LAB — TYPE AND SCREEN
Unit division: 0
Unit division: 0

## 2011-01-28 LAB — BASIC METABOLIC PANEL
Calcium: 8.4 mg/dL (ref 8.4–10.5)
GFR calc non Af Amer: >60 mL/min (ref >60)
Glucose, Bld: 94 mg/dL (ref 70–99)
Sodium: 134 mEq/L — ABNORMAL LOW (ref 135–145)

## 2011-01-28 LAB — CARDIAC PANEL(CRET KIN+CKTOT+MB+TROPI)
CK, MB: 3.3 ng/mL (ref 0.3–4.0)
Relative Index: INVALID (ref 0.0–2.5)
Troponin I: <0.3 ng/mL (ref <0.30)

## 2011-01-28 LAB — CBC
HCT: 32.6 % — ABNORMAL LOW (ref 36.0–46.0)
MCH: 27.5 pg (ref 26.0–34.0)
MCHC: 34 g/dL (ref 30.0–36.0)
Platelets: 270 10*3/uL (ref 150–400)
RBC: 4.03 MIL/uL (ref 3.87–5.11)
RDW: 17.8 % — ABNORMAL HIGH (ref 11.5–15.5)
WBC: 12.1 10*3/uL — ABNORMAL HIGH (ref 4.0–10.5)

## 2011-01-28 LAB — GLUCOSE, CAPILLARY
Glucose-Capillary: 95 mg/dL (ref 70–99)
Glucose-Capillary: 165 mg/dL — ABNORMAL HIGH (ref 70–99)
Glucose-Capillary: 99 mg/dL (ref 70–99)

## 2011-01-28 SURGERY — EGD (ESOPHAGOGASTRODUODENOSCOPY)
Anesthesia: Moderate Sedation

## 2011-01-28 MED ORDER — MEPERIDINE HCL 100 MG/ML IJ SOLN
INTRAMUSCULAR | Status: AC
  Filled 2011-01-28: qty 2

## 2011-01-28 MED ORDER — INSULIN ASPART 100 UNIT/ML ~~LOC~~ SOLN: 0.0000 [IU] | Freq: Three times a day (TID) | SUBCUTANEOUS | Status: DC

## 2011-01-28 MED ORDER — INSULIN ASPART 100 UNIT/ML ~~LOC~~ SOLN
0.0000 [IU] | Freq: Three times a day (TID) | SUBCUTANEOUS | Status: DC
  Administered 2011-01-29: 1 [IU] via SUBCUTANEOUS
  Administered 2011-01-29: 2 [IU] via SUBCUTANEOUS
  Administered 2011-01-30: 3 [IU] via SUBCUTANEOUS
  Administered 2011-01-30 – 2011-01-31 (×2): 2 [IU] via SUBCUTANEOUS
  Administered 2011-01-31: 1 [IU] via SUBCUTANEOUS
  Administered 2011-01-31 – 2011-02-01 (×4): 2 [IU] via SUBCUTANEOUS
  Administered 2011-02-01 – 2011-02-02 (×2): 1 [IU] via SUBCUTANEOUS
  Administered 2011-02-02 – 2011-02-04 (×5): 2 [IU] via SUBCUTANEOUS
  Administered 2011-02-05: 3 [IU] via SUBCUTANEOUS
  Administered 2011-02-05: 2 [IU] via SUBCUTANEOUS
  Administered 2011-02-05 – 2011-02-06 (×3): 1 [IU] via SUBCUTANEOUS
  Administered 2011-02-06 – 2011-02-07 (×2): 2 [IU] via SUBCUTANEOUS
  Administered 2011-02-07: 1 [IU] via SUBCUTANEOUS
  Administered 2011-02-08 – 2011-02-09 (×4): 2 [IU] via SUBCUTANEOUS

## 2011-01-28 MED ORDER — MEPERIDINE HCL 100 MG/ML IJ SOLN
INTRAMUSCULAR | Status: DC | PRN
  Administered 2011-01-28 (×4): 25 mg via INTRAVENOUS

## 2011-01-28 MED ORDER — SODIUM CHLORIDE 0.45 % IV SOLN: INTRAVENOUS | Status: DC

## 2011-01-28 MED ORDER — INSULIN GLARGINE 100 UNIT/ML ~~LOC~~ SOLN: 15.0000 [IU] | SUBCUTANEOUS | Status: DC

## 2011-01-28 MED ORDER — AMLODIPINE BESYLATE 5 MG PO TABS
5.0000 mg | ORAL_TABLET | Freq: Every day | ORAL | Status: DC
  Administered 2011-01-28 – 2011-02-04 (×9): 5 mg via ORAL
  Filled 2011-01-28 (×7): qty 1

## 2011-01-28 MED ORDER — SODIUM CHLORIDE 0.45 % IV SOLN
Freq: Once | INTRAVENOUS | Status: AC
  Administered 2011-01-28: 15:00:00 via INTRAVENOUS

## 2011-01-28 MED ORDER — BENAZEPRIL HCL 10 MG PO TABS
20.0000 mg | ORAL_TABLET | Freq: Every day | ORAL | Status: DC
  Administered 2011-01-28 – 2011-02-04 (×9): 20 mg via ORAL
  Filled 2011-01-28 (×2): qty 2
  Filled 2011-01-28: qty 1
  Filled 2011-01-28 (×4): qty 2
  Filled 2011-01-28: qty 1

## 2011-01-28 MED ORDER — POTASSIUM CHLORIDE IN NACL 20-0.9 MEQ/L-% IV SOLN
INTRAVENOUS | Status: DC
  Administered 2011-01-28 – 2011-01-29 (×2): via INTRAVENOUS

## 2011-01-28 MED ORDER — MIDAZOLAM HCL 5 MG/5ML IJ SOLN
INTRAMUSCULAR | Status: AC
  Administered 2011-01-28: 15:00:00
  Filled 2011-01-28: qty 10

## 2011-01-28 MED ORDER — MIDAZOLAM HCL 5 MG/5ML IJ SOLN
INTRAMUSCULAR | Status: DC | PRN
  Administered 2011-01-28: 2 mg via INTRAVENOUS
  Administered 2011-01-28 (×3): 1 mg via INTRAVENOUS
  Administered 2011-01-28: 2 mg via INTRAVENOUS
  Administered 2011-01-28: 1 mg via INTRAVENOUS
  Administered 2011-01-28: 2 mg via INTRAVENOUS

## 2011-01-28 MED ORDER — DEXTROSE IN LACTATED RINGERS 5 % IV SOLN
INTRAVENOUS | Status: DC
  Administered 2011-01-28: 15:00:00 via INTRAVENOUS

## 2011-01-28 MED ORDER — BUTAMBEN-TETRACAINE-BENZOCAINE 2-2-14 % EX AERO
INHALATION_SPRAY | CUTANEOUS | Status: DC | PRN
  Administered 2011-01-28: 2 via TOPICAL

## 2011-01-28 NOTE — Interval H&P Note (Signed)
History and Physical Interval Note:   01/28/2011   2:49 PM   [name]  has presented today for surgery, with the diagnosis of rectal mass, anemia  The various methods of treatment have been discussed with the patient and family. After consideration of risks, benefits and other options for treatment, the patient has consented to  Procedure(s): ESOPHAGOGASTRODUODENOSCOPY (EGD) COLONOSCOPY as a surgical intervention .  I have reviewed the patients' chart and labs.  Questions were answered to the patient's satisfaction.     [name]  MD

## 2011-01-28 NOTE — H&P (Signed)
Consult Note signed by Lorenza Burton, NP at 01/27/11 1020     Author: Lorenza Burton, NP Service: Gastroenterology Author Type: FAMILY NURSE PRACTITIONER    Filed: 01/27/11 1020 Note Time: 01/27/11 1610        Related Notes: Cosigned by: Arlyce Harman, MD filed at 01/27/11 1401       Referring Provider: Dr. Vania Rea Primary Care Physician:  [name], MD, MD Primary Gastroenterologist:  Dr. Darrick Penna   Reason for Consultation:  Anemia   HPI: [name] is a 77 y.o. black female who states she passed out yesterday after going to visit a neighbor.  Admitted w/ anemia & hgb 6-range.  S/p 2 units PRBCs & hgb 9 grams this AM.  C/o "feeling bad" with anorexia, dizziness, weak legs & insomnia x 1 week.  C/o abdominal pain right behind umbilicus x 1 week.  Pain worse w/ eating.   Pain 8/10 & intermittent & lasts few minutes.  Vomited once last week.  Notes BM QOD.  Hx incontinence & unsure whether she has had melena or rectal bleeding.  C/o heartburn & indigestion QOD.  Takes nexium 40mg  daily.  Takes Advil 200-400mg  almost every day.  Denies Goodys or BC powders.   Hx severe ulcerative esophagitis on EGD in 2009.  Hx focal pancreatitis HOP & elevated gastrin levels 2009.  Pt was supposed to have follow-up gastrin levels & pancreatic protocol CT with [place], but pt does not believe she ever did.    Past Medical History   Diagnosis  Date   .  Diabetes mellitus     .  Hypertension     .  Reflux     .  Right elbow pain         OTIF   .  Dementia     .  Depression     .  Osteoarthritis     .  Pancreatitis  11/2007       HOP   .  Angiomyolipoma of kidney         right   .  Ulcerative esophagitis  12/05/2007       hx elevated gastrin, severe on EGD by Dr Jena Gauss , h pylori negative   .  Hiatal hernia     .  S/P colonoscopy  2009       pt reports normal by Dr Lovell Sheehan         Past Surgical History   Procedure  Date   .  Orif right hip  1999       [facility]   .  Umbilical hernia  repair  23 months old       [place]         Prior to Admission medications    Medication  Sig  Start Date  End Date  Taking?  Authorizing Provider   amLODipine (NORVASC) 5 MG tablet  Take 1 tablet (5 mg total) by mouth daily. Take one tablet by mouth once a day    12/08/10    Yes  [name], MD   B-D ULTRAFINE III SHORT PEN 31G X 8 MM MISC  USE AS DIRECTED.  11/29/10    Yes  [name], MD   benazepril (LOTENSIN) 20 MG tablet  Take 1 tablet (20 mg total) by mouth daily.  12/08/10  12/08/11  Yes  [name], MD   Calcium Carbonate-Vitamin D (OYSTER SHELL CALCIUM/D) 500-200 MG-UNIT TABS  TAKE (1) TABLET BY  MOUTH ONCE DAILY.  10/24/10    Yes  [name], MD   Chlorpheniramine-DM (CORICIDIN HBP COUGH/COLD PO)  Take 2 tablets by mouth 2 (two) times daily. OTC       Yes  Historical Provider, MD   esomeprazole (NEXIUM) 40 MG capsule  Take 40 mg by mouth daily before breakfast. Take one tablet by mouth once a day      Yes  Historical Provider, MD   glipiZIDE (GLUCOTROL) 10 MG tablet  Take 1 tablet (10 mg total) by mouth 2 (two) times daily before a meal.  12/08/10    Yes  [name], MD   glucose blood (ONE TOUCH TEST STRIPS) test strip  1 each by Other route 3 (three) times daily. Use as directed three times a day       Yes  Historical Provider, MD   HYDROcodone-acetaminophen (NORCO) 5-325 MG per tablet  Take 1 tablet by mouth every 4 (four) hours as needed for pain.  01/24/11  02/03/11  Yes  Nicoletta Dress. Colon Branch, MD   HYDROcodone-acetaminophen (VICODIN) 5-500 MG per tablet  Take 1-2 tablets by mouth every 6 (six) hours as needed for pain.  01/20/11  01/30/11  Yes  Sunnie Nielsen, MD   insulin glargine (LANTUS SOLOSTAR) 100 UNIT/ML injection  14 Units 2 (two) times daily.   01/16/11    Yes  [name], MD   lovastatin (MEVACOR) 10 MG tablet  Take 1 tablet (10 mg total) by mouth at bedtime.  12/08/10    Yes  [name], MD   metFORMIN (GLUCOPHAGE) 1000 MG tablet  Take 1,000 mg by  mouth 2 (two) times daily with a meal.    10/27/10    Yes  [name], MD   Multiple Vitamin (MULTIVITAMIN) tablet  Take 1 tablet by mouth daily.        Yes  Historical Provider, MD   Multiple Vitamins-Minerals (MULTIVITAMIN WITH IRON-MINERALS) liquid  Take 10 mLs by mouth daily.    01/23/11  01/23/12  Yes  [name], MD   naproxen sodium (ANAPROX) 220 MG tablet  Take 220 mg by mouth 2 (two) times daily with a meal. Pain       Yes  Historical Provider, MD   temazepam (RESTORIL) 30 MG capsule  Take 1 capsule (30 mg total) by mouth at bedtime as needed for sleep.  12/08/10    Yes  [name], MD   benzonatate (TESSALON PERLES) 100 MG capsule  Take 1 capsule (100 mg total) by mouth 3 (three) times daily as needed. Take one capsule by mouth three times a day  10/27/10      [name], MD   darifenacin (ENABLEX) 15 MG 24 hr tablet  Take 7.5-15 mg by mouth daily.    12/24/10      [name], MD   fluticasone (FLONASE) 50 MCG/ACT nasal spray  1 spray by Nasal route daily.  10/27/10  10/27/11    [name], MD   HYDROXYZINE HCL PO  Take 25 mg elemental calcium/kg/hr by mouth. Take one tablet by mouth at bedtime        Historical Provider, MD   ibuprofen (ADVIL,MOTRIN) 200 MG tablet  Take 200 mg by mouth 2 (two) times daily as needed. OTC FOR Pain        Historical Provider, MD   Misc. Devices [facility]) MISC  by Does not apply route.          Historical Provider, MD  omeprazole (PRILOSEC) 20 MG capsule  Take 20 mg by mouth daily.         Historical Provider, MD   ondansetron (ZOFRAN) 4 MG tablet  Take 1 tablet (4 mg total) by mouth every 6 (six) hours.  01/20/11  01/27/11    Sunnie Nielsen, MD         Current Facility-Administered Medications   Medication  Dose  Route  Frequency  Provider  Last Rate  Last Dose   .  0.9 %  sodium chloride infusion     Intravenous  Once  Felisa Bonier, MD  100 mL/hr at 01/26/11 2210      .  acetaminophen (TYLENOL) tablet 650 mg   650 mg  Oral  Q6H  PRN  Vania Rea           Or   .  acetaminophen (TYLENOL) suppository 650 mg   650 mg  Rectal  Q6H PRN  Vania Rea         .  bisacodyl (DULCOLAX) suppository 10 mg   10 mg  Rectal  Q48H PRN  Vania Rea         .  docusate sodium (COLACE) capsule 100 mg   100 mg  Oral  BID  Vania Rea     100 mg at 01/27/11 0050   .  HYDROmorphone (DILAUDID) injection 0.5 mg   0.5 mg  Intravenous  Once  Felisa Bonier, MD     0.5 mg at 01/26/11 1945   .  HYDROmorphone (DILAUDID) injection 0.5 mg   0.5 mg  Intravenous  Q2H PRN  Vania Rea     0.5 mg at 01/27/11 0422   .  insulin aspart (novoLOG) injection 0-15 Units   0-15 Units  Subcutaneous  Q4H  Vania Rea     3 Units at 01/27/11 0429   .  insulin aspart (novoLOG) injection 0-5 Units   0-5 Units  Subcutaneous  QHS  Vania Rea         .  insulin glargine (LANTUS) injection 14 Units   14 Units  Subcutaneous  BID  Vania Rea     14 Units at 01/27/11 0051   .  iohexol (OMNIPAQUE) 350 MG/ML injection 100 mL   100 mL  Intravenous  Once PRN  Medication Radiologist     100 mL at 01/26/11 2207   .  ondansetron (ZOFRAN) injection 4 mg   4 mg  Intravenous  Once  Felisa Bonier, MD     4 mg at 01/26/11 1945   .  ondansetron (ZOFRAN) tablet 4 mg   4 mg  Oral  Q6H PRN  Vania Rea           Or   .  ondansetron [facility]) injection 4 mg   4 mg  Intravenous  Q6H PRN  Vania Rea         .  oxyCODONE (Oxy IR/ROXICODONE) immediate release tablet 5 mg   5 mg  Oral  Q4H PRN  Vania Rea         .  pantoprazole (PROTONIX) injection 40 mg   40 mg  Intravenous  Q12H  Leopold Campbell     40 mg at 01/27/11 0051   .  polyethylene glycol (MIRALAX / GLYCOLAX) packet 17 g   17 g  Oral  Daily  Vania Rea         .  senna-docusate (Senokot-S) tablet 1 tablet   1 tablet  Oral  Daily PRN  Vania Rea         .  sodium chloride 0.9 % 1,000 mL with potassium chloride 20 mEq infusion     Intravenous  Continuous  Vania Rea  75 mL/hr at 01/27/11 0015      .  sodium phosphate (FLEET) 7-19 GM/118ML enema 1 enema   1 enema  Rectal  Q48H PRN  Vania Rea         .  traZODone (DESYREL) tablet 25 mg   25 mg  Oral  QHS PRN  Vania Rea         .  DISCONTD: sodium chloride 0.9 % bolus 1,000 mL   1,000 mL  Intravenous  Once  Felisa Bonier, MD         .  DISCONTD: sodium chloride 0.9 % injection                        Allergies as of 01/26/2011 - Review Complete 01/26/2011   Allergen  Reaction  Noted   .  Other  Swelling  01/22/2011   .  Penicillins  Swelling  09/13/2007         Family History:There is no known family history of colorectal carcinoma , liver disease, or inflammatory bowel disease.   Problem  Relation  Age of Onset   .  Cancer  Mother         pelvic          History       Social History   .  Marital Status:  Widowed       Spouse Name:  N/A       Number of Children:  1   .  Years of Education:  N/A       Occupational History   .  retired          Social History Main Topics   .  Smoking status:  Never Smoker    .  Smokeless tobacco:  Current User       Types:  Chew   .  Alcohol Use:  No         Hx of Alcohol dependecy    .  Drug Use:  No   .  Sexually Active:  No    Review of Systems: Gen: c/o profound weakness & malaise CV: Denies chest pain, angina, palpitations, syncope, orthopnea, PND, peripheral edema, and claudication. Resp: Some dyspnea with exercise.  Denies cough, sputum, wheezing, coughing up blood, and pleurisy. GI: Denies vomiting blood, jaundice.   Denies dysphasia or odynophagia. GU : Denies urinary burning, blood in urine, urinary frequency, urinary hesitancy, nocturnal urination, and urinary incontinence. MS: Denies joint pain, limitation of movement, and swelling, stiffness, low back pain, extremity pain. Denies muscle weakness, cramps, atrophy.   Derm: Denies rash, itching, dry skin, hives, moles, warts, or unhealing ulcers.   Psych:  Denies depression, anxiety, memory loss, suicidal ideation, hallucinations, paranoia, and confusion. Heme: Denies bruising, bleeding, and enlarged lymph nodes.   Physical Exam: Vital signs in last 24 hours: Temp:  [98 F (36.7 C)-99.2 F (37.3 C)] 98.4 F (36.9 C) (07/31 0800) Pulse Rate:  [85-111] 97  (07/31 0800) Resp:  [18-25] 19  (07/31 0800) BP: (110-145)/(58-89) 125/82 mmHg (07/31 0800) SpO2:  [96 %-100 %] 100 % (07/31 0143) Last BM Date: 01/25/11 General:   Alert,  Well-developed,obese, pleasant and cooperative in NAD Head:  Normocephalic and atraumatic.  Eyes:  Sclera clear, no icterus.   Conjunctiva pink. Ears:  Normal auditory acuity. Nose:  No deformity, discharge,  or lesions. Mouth:  No deformity or lesions, dentition normal. Neck:  Supple; no masses or thyromegaly. Lungs:  Clear throughout to auscultation.   No wheezes, crackles, or rhonchi. No acute distress. Heart:  Regular rate and rhythm; no murmurs, clicks, rubs,  or gallops. Abdomen:  Obese, Soft, nontender and nondistended. No masses, hepatosplenomegaly or hernias noted. Normal bowel sounds, without guarding, and without rebound.    Rectal:  No internal or external lesions noted.  Small amt hard brown stool obtained from vault was HEME NEGATIVE. Msk:  Symmetrical without gross deformities. Normal posture. Pulses:  Normal pulses noted. Extremities:  With clubbing & trace edema. Neurologic:  Alert and  oriented x4;  grossly normal neurologically. Skin:  Intact without significant lesions or rashes. Cervical Nodes:  No significant cervical adenopathy. Psych:  Alert and cooperative. Normal mood and affect.   Lab Results:   [facility]  01/27/11 0857  01/26/11 2149  01/26/11 1952   WBC  13.5*  --  18.0*   HGB  9.0*  6.7*  6.7*   HCT  26.4*  19.9*  19.7*   PLT  279  --  318      BMET   Basename  01/26/11 1952   NA  125*   K  4.2   CL  93*   CO2  22   GLUCOSE  259*   BUN  55*   CREATININE  0.98     CALCIUM  9.3      LFT   Basename  01/27/11 0030   PROT  5.9*   ALBUMIN  2.4*   AST  15   ALT  15   ALKPHOS  59   BILITOT  0.1*   BILIDIR  <0.1   IBILI  NOT CALCULATED      PT/INR   Basename  01/27/11 0030   LABPROT  14.7   INR  1.13      Studies/Results:   CT ABD/Pelvis 2011/02/01 w/ IV contrast:  Mild soft tissue stranding anterior to the pancreatic head, correlate for pancreatitis.   Normal appendix. No evidence of bowel obstruction.   01/26/2011 CHEST - 2 VIEW  Findings: Heart size is normal.  Both lungs are clear.  No evidence of pleural effusion.  No mass or lymphadenopathy identified.  IMPRESSION: Stable exam.  No active disease.  Original Report Authenticated By: Danae Orleans, M.D.    Ct Head Wo Contrast:  01/25/2011  Findings: There is no evidence of intracranial hemorrhage, brain edema or other signs of acute infarction.  There is no evidence of intracranial mass lesion or mass effect.  No abnormal extra-axial fluid collections are identified.  Moderate to severe cerebellar atrophy and mild cerebral atrophy are stable in appearance. Moderate chronic small vessel disease is also unchanged.  No evidence of hydrocephalus.  No skull abnormality identified.  IMPRESSION:  1.  No acute intracranial abnormality. 2.  Stable cerebral and cerebellar atrophy and chronic small vessel disease.  Original Report Authenticated By: Danae Orleans, M.D.    Ct Angio Chest W/cm &/or Wo Cm:  01/26/2011 IMPRESSION:  1.  No evidence of pulmonary embolism or other acute findings. 2.  Diffuse coronary artery calcification noted.  Original Report Authenticated By: Danae Orleans, M.D.      Impression: [name] is a 78 y.o. black female w/ profound anemia without evidence of  GI bleed at this time.  She may have intermittent GI bleeding & is on OTC advil prn.  She has chronic subtle changes about the head of the pancreas ? focal pancreatitis vs. Small mass.  She does have hx severe erosive  esophagitis & elevated gastrin levels.  Will check hemoccults to r/o GI bleed & proceed w/ EGD. Rectal mass palpated by Dr Orvan Falconer will need further evaluation with colonoscopy in near future.      Plan: Follow H/H & agree w/ transfusion to keep hgb around 9 grams Hemoccult stools x 3 EGD & colonoscopy w/ Dr Darrick Penna in AM Agree w/ protonix 40mg  BID Will need further imaging pancreas via pancreatic protocol CT Will need recheck gastrin levels

## 2011-01-28 NOTE — Progress Notes (Signed)
Subjective: The patient's only complaint is one of a slight frontal headache. She has no complaints of visual changes, numbness, or unilateral weakness. Objective: Vital signs in last 24 hours: Filed Vitals:   01/27/11 1916 01/27/11 2101 01/27/11 2152 01/28/11 0600  BP: 145/81  164/96 153/81  Pulse: 86  82 72  Temp: 97.9 F (36.6 C)  98.4 F (36.9 C) 99.4 F (37.4 C)  TempSrc: Oral  Oral Oral  Resp: 20  20 20   SpO2: 90% 93% 97% 92%   Weight change:   Intake/Output Summary (Last 24 hours) at 01/28/11 0929 Last data filed at 01/28/11 0600  Gross per 24 hour  Intake   5250 ml  Output   2550 ml  Net   2700 ml   Physical Exam: General: Awake, Oriented, No acute distress. HEENT: EOMI. Neck: Supple CV: S1 and S2 Lungs: Clear to ascultation bilaterally Abdomen: Soft, Nontender, Nondistended, +bowel sounds. Ext: Good pulses. Trace edema.  Lab Results:  Basename 01/28/11 0638 01/27/11 0857 01/27/11 0030  NA 134* 130* --  K 3.5 3.8 --  CL 99 97 --  CO2 21 19 --  GLUCOSE 94 235* --  BUN 14 41* --  CREATININE 0.56 0.80 --  CALCIUM 8.4 8.1* --  MG -- -- 1.8  PHOS -- -- --    Basename 01/27/11 0030 01/26/11 1952  AST 15 14  ALT 15 15  ALKPHOS 59 60  BILITOT 0.1* 0.1*  PROT 5.9* 5.9*  ALBUMIN 2.4* 2.3*    Basename 01/26/11 1952  LIPASE 22  AMYLASE --    Basename 01/28/11 0638 01/27/11 1609 01/26/11 1952  WBC 10.5 13.5* --  NEUTROABS -- -- 15.2*  HGB 11.1* 10.4* --  HCT 32.6* 30.4* --  MCV 80.7 79.4 --  PLT 259 267 --    Basename 01/27/11 1609 01/27/11 0858 01/27/11  CKTOTAL 56 54 64  CKMB 3.2 3.5 3.3  CKMBINDEX -- -- --  TROPONINI <0.30 <0.30 <0.30   No results found for this basename: POCBNP:3 in the last 72 hours  Basename 01/26/11 1952  DDIMER 1.62*    Basename 01/27/11 0030  HGBA1C 7.7*   No results found for this basename: CHOL:2,HDL:2,LDLCALC:2,TRIG:2,CHOLHDL:2,LDLDIRECT:2 in the last 72 hours No results found for this basename:  TSH,T4TOTAL,FREET3,T3FREE,THYROIDAB in the last 72 hours No results found for this basename: VITAMINB12:2,FOLATE:2,FERRITIN:2,TIBC:2,IRON:2,RETICCTPCT:2 in the last 72 hours Micro Results: Recent Results (from the past 240 hour(s))  SURGICAL PCR SCREEN     Status: Normal   Collection Time   01/23/11 11:30 AM      Component Value Range Status Comment   MRSA, PCR NEGATIVE  NEGATIVE  Final    Staphylococcus aureus NEGATIVE  NEGATIVE  Final    Studies/Results: Dg Chest 2 View  01/26/2011  *RADIOLOGY REPORT*  Clinical Data: Syncope.  Dizziness.  Shortness of breath.  CHEST - 2 VIEW  Comparison: 03/29/2010  Findings: Heart size is normal.  Both lungs are clear.  No evidence of pleural effusion.  No mass or lymphadenopathy identified.  IMPRESSION: Stable exam.  No active disease.  Original Report Authenticated By: Danae Orleans, M.D.   Ct Head Wo Contrast  01/25/2011  *RADIOLOGY REPORT*  Clinical Data: Severe headache.  Dementia.  CT HEAD WITHOUT CONTRAST  Technique:  Contiguous axial images were obtained from the base of the skull through the vertex without contrast.  Comparison: 03/30/2009  Findings: There is no evidence of intracranial hemorrhage, brain edema or other signs of acute infarction.  There is  no evidence of intracranial mass lesion or mass effect.  No abnormal extra-axial fluid collections are identified.  Moderate to severe cerebellar atrophy and mild cerebral atrophy are stable in appearance. Moderate chronic small vessel disease is also unchanged.  No evidence of hydrocephalus.  No skull abnormality identified.  IMPRESSION:  1.  No acute intracranial abnormality. 2.  Stable cerebral and cerebellar atrophy and chronic small vessel disease.  Original Report Authenticated By: Danae Orleans, M.D.   Ct Angio Chest W/cm &/or Wo Cm  01/26/2011  *RADIOLOGY REPORT*  Clinical Data:  Short of breath. Tachycardia.  Syncope.  Elevated D- dimer.  CT ANGIOGRAPHY CHEST WITH CONTRAST  Technique:   Multidetector CT imaging of the chest was performed using the standard protocol during bolus administration of intravenous contrast.  Multiplanar CT image reconstructions including MIPs were obtained to evaluate the vascular anatomy.  Contrast:  100 ml Omnipaque-300  Comparison:  None.  Findings:  Satisfactory opacification of the pulmonary arteries noted, and there is no evidence of pulmonary emboli.  No evidence of thoracic aortic aneurysm or dissection. Diffuse coronary artery calcification noted.  No mediastinal or hilar masses are identified.  No adenopathy seen elsewhere within the thorax.  No evidence of pleural or pericardial effusion.  Both lungs are clear.  No evidence of pulmonary infiltrate or mass.  No endobronchial lesion identified.  Review of the MIP images confirms the above findings.  IMPRESSION:  1.  No evidence of pulmonary embolism or other acute findings. 2.  Diffuse coronary artery calcification noted.  Original Report Authenticated By: Danae Orleans, M.D.   Ct Abdomen Pelvis W Contrast  01/20/2011  *RADIOLOGY REPORT*  Clinical Data: Abdominal pain, hernia repair, right renal angiomyolipoma  CT ABDOMEN AND PELVIS WITH CONTRAST  Technique:  Multidetector CT imaging of the abdomen and pelvis was performed following the standard protocol during bolus administration of intravenous contrast.  Contrast: 100 ml Omnipaque-300 IV  Comparison: CT abdomen dated 02/18/2008  Findings: Minimal dependent atelectasis lung bases.  Liver, spleen and adrenal glands within normal limits.  Mild soft tissue stranding anterior to the pancreatic head (series 2/image 30).  Gallbladder is unremarkable. No intrahepatic ductal dilatation. Common bile duct measures 8 mm.  Tiny bilateral renal cysts.  Right upper pole angiomyolipoma.  No evidence of bowel obstruction.  Normal appendix.  Colonic diverticulosis, without associated inflammatory changes.  Moderate stool in the rectum.  No abdominopelvic ascites.  No  suspicious abdominopelvic lymphadenopathy.  Uterus unremarkable.  No adnexal masses.  Bladder is within normal limits.  Degenerative changes of the visualized thoracolumbar spine. Right hip arthroplasty.  IMPRESSION: Mild soft tissue stranding anterior to the pancreatic head, correlate for pancreatitis.  Normal appendix.  No evidence of bowel obstruction.  Additional ancillary findings as above.  Original Report Authenticated By: Charline Bills, M.D.   Medications: I have reviewed the patient's current medications. Scheduled Meds:    . sodium chloride   Intravenous Once  . acetylcysteine  600 mg Oral BID  . amLODipine  5 mg Oral Daily  . benazepril  20 mg Oral Daily  . bisacodyl  5 mg Oral BID  . docusate sodium  100 mg Oral BID  . insulin aspart  0-15 Units Subcutaneous Q4H  . insulin aspart  0-5 Units Subcutaneous QHS  . insulin glargine  14 Units Subcutaneous BID  . insulin glargine  7 Units Subcutaneous BID  . pantoprazole (PROTONIX) IV  40 mg Intravenous Q12H  . polyethylene glycol  4,000 mL Oral  Once  . polyethylene glycol  17 g Oral Daily  . DISCONTD: insulin glargine  14 Units Subcutaneous BID  . DISCONTD: insulin glargine  7 Units Subcutaneous BID  . DISCONTD: polyethylene glycol  4,000 mL Oral Once  . DISCONTD: polyethylene glycol  4,000 mL Oral Once  . DISCONTD: polyethylene glycol  17 g Oral Daily   Continuous Infusions:    . 0.9 % NaCl with KCl 20 mEq / L 20 mL (01/28/11 0441)  . DISCONTD: sodium chloride 0.9 % 1,000 mL with potassium chloride 20 mEq infusion 75 mL/hr at 01/27/11 0015   PRN Meds:.acetaminophen, acetaminophen, bisacodyl, HYDROmorphone, iohexol, ondansetron (ZOFRAN) IV, ondansetron, oxyCODONE, senna-docusate, sodium phosphate, traZODone Assessment/Plan: 1. Syncope and collapse likely due to anemia. Improved with IV hydration and blood transfusion. Troponins negative x2. CT chest negative for pulmonary embolism.  2. Severe anemia. Status post 2 units of  PRBC. Hemoglobin improved to 9.0 after transfusion from 6.7. It is now 11.1  GI Dr. Darrick Penna has been consulted and an EGD and colonoscopy are planned for today. On IV pantoprazole. Holding NSAIDs.  3. Hyponatremia. Resolving with IV hydration.  4. Type 2 diabetes currently on Lantus and sliding scale insulin. Blood sugar stable. Holding metformin and glipizide.  5. Leukocytosis. Likely reactive. UA negative. CT chest negative for infectious etiology. Resolved.  6. GERD. On IV pantoprazole.  7. Hypertension. Her blood pressure is trending up off of her meds. Will restart Benazepril and Norvasc and decrease the IVFs.  8. Hyperlipidemia. Holding lovastatin for the time being.  9. Prophylaxis. SCDs for DVT prophylaxis; no heparin given severe anemia.  10. Mild frontal headache.  11. Mild hepatic steatosis per CT. LFTs are unremarkable.   LOS: 2 days   Mattilynn Forrer 01/28/2011, 9:29 AM

## 2011-01-29 ENCOUNTER — Ambulatory Visit (HOSPITAL_COMMUNITY): Admission: RE | Admit: 2011-01-29 | Payer: Medicare HMO | Source: Ambulatory Visit | Admitting: Urology

## 2011-01-29 ENCOUNTER — Encounter (HOSPITAL_COMMUNITY): Admission: RE | Payer: Self-pay | Source: Ambulatory Visit

## 2011-01-29 DIAGNOSIS — K319 Disease of stomach and duodenum, unspecified: Secondary | ICD-10-CM

## 2011-01-29 DIAGNOSIS — D649 Anemia, unspecified: Secondary | ICD-10-CM

## 2011-01-29 DIAGNOSIS — K3189 Other diseases of stomach and duodenum: Secondary | ICD-10-CM | POA: Diagnosis present

## 2011-01-29 DIAGNOSIS — K922 Gastrointestinal hemorrhage, unspecified: Secondary | ICD-10-CM

## 2011-01-29 LAB — CBC
Hemoglobin: 9.2 g/dL — ABNORMAL LOW (ref 12.0–15.0)
MCH: 27.1 pg (ref 26.0–34.0)
MCHC: 33.3 g/dL (ref 30.0–36.0)
MCV: 81.4 fL (ref 78.0–100.0)
RBC: 3.39 MIL/uL — ABNORMAL LOW (ref 3.87–5.11)

## 2011-01-29 LAB — BASIC METABOLIC PANEL
BUN: 6 mg/dL (ref 6–23)
CO2: 22 mEq/L (ref 19–32)
Calcium: 8.1 mg/dL — ABNORMAL LOW (ref 8.4–10.5)
GFR calc non Af Amer: >60 mL/min (ref >60)
Glucose, Bld: 110 mg/dL — ABNORMAL HIGH (ref 70–99)
Potassium: 3.6 mEq/L (ref 3.5–5.1)
Sodium: 133 mEq/L — ABNORMAL LOW (ref 135–145)

## 2011-01-29 LAB — GASTRIN: Gastrin: 330 pg/mL — ABNORMAL HIGH (ref <101)

## 2011-01-29 LAB — GLUCOSE, CAPILLARY: Glucose-Capillary: 117 mg/dL — ABNORMAL HIGH (ref 70–99)

## 2011-01-29 SURGERY — URETHROPEXY, USING TRANSVAGINAL TAPE
Anesthesia: Choice

## 2011-01-29 MED ORDER — INSULIN GLARGINE 100 UNIT/ML ~~LOC~~ SOLN
15.0000 [IU] | Freq: Every day | SUBCUTANEOUS | Status: DC
  Administered 2011-01-29 – 2011-02-03 (×6): 15 [IU] via SUBCUTANEOUS
  Filled 2011-01-29: qty 3

## 2011-01-29 MED ORDER — SODIUM CHLORIDE 0.9 % IJ SOLN
INTRAMUSCULAR | Status: AC
  Administered 2011-01-29: 10 mL
  Filled 2011-01-29: qty 10

## 2011-01-29 MED ORDER — PANTOPRAZOLE SODIUM 40 MG PO TBEC
40.0000 mg | DELAYED_RELEASE_TABLET | Freq: Two times a day (BID) | ORAL | Status: DC
  Administered 2011-01-29 – 2011-02-04 (×10): 40 mg via ORAL
  Filled 2011-01-29 (×11): qty 1

## 2011-01-29 NOTE — Progress Notes (Signed)
Physical Therapy Evaluation Patient Name: [name] [id number] Date: 01/29/2011 Problem List:  Patient Active Problem List  Diagnoses  . DIABETES MELLITUS, TYPE II  . HYPERLIPIDEMIA  . OBESITY, UNSPECIFIED  . DEPRESSION  . HYPERTENSION  . SINUSITIS, ACUTE  . ALLERGIC RHINITIS, SEASONAL  . OTHER CHRONIC BRONCHITIS  . DIVERTICULOSIS OF COLON  . ACUTE CYSTITIS  . INSOMNIA  . FATIGUE  . UNSTEADY GAIT  . HEADACHE  . OTHER URINARY INCONTINENCE  . CLOSED FRACTURE OF OLECRANON PROCESS OF ULNA  . HEMATOMA  . HEADACHES, HX OF  . Anorexia  . Anemia  . Rectal mass  . Syncope and collapse  . Hepatic steatosis  . Hyponatremia  . Gastric mass   Past Medical History:  Past Medical History  Diagnosis Date  . Hypertension   . Reflux   . Right elbow pain     OTIF  . Dementia   . Depression   . Osteoarthritis   . Pancreatitis 11/2007    [facility]  . Angiomyolipoma of kidney     right  . Ulcerative esophagitis 12/05/2007    hx elevated gastrin, severe on EGD by Dr Jena Gauss , h pylori negative  . Hiatal hernia   . S/P colonoscopy 2009    pt reports normal by Dr Lovell Sheehan  . Diabetes mellitus   . Anemia    Past Surgical History:  Past Surgical History  Procedure Date  . Orif right hip 1999    [facility]  . Umbilical hernia repair 41 months old    [place]    Precautions/Restrictions  Precautions Required Braces or Orthoses: No Restrictions Weight Bearing Restrictions: No Prior Functioning  Home Living Type of Home: House Lives With: Alone Receives Help From: Personal care attendant (CNA there 5 days per week) Home Layout: One level Home Access: Stairs to enter Entrance Stairs-Rails: None Entrance Stairs-Number of Steps: 1 Bathroom Shower/Tub: Engineer, manufacturing systems: Standard Bathroom Accessibility: Yes How Accessible: Accessible via walker Home Adaptive Equipment: Walker - rolling;Straight cane Prior Function Level of Independence: Needs assistance with ADLs;Needs  assistance with homemaking;Requires assistive device for independence;Independent with transfers Vocation: Retired Financial risk analyst Arousal/Alertness: Awake/alert Overall Cognitive Status: Appears within functional limits for tasks assessed Orientation Level: Oriented X4 Sensation/Coordination Sensation Light Touch: Appears Intact Proprioception: Appears Intact Extremity Assessment RUE Assessment RUE Assessment: Within Functional Limits LUE Assessment LUE Assessment: Within Functional Limits RLE Assessment RLE Assessment: Within Functional Limits LLE Assessment LLE Assessment: Within Functional Limits Mobility (including Balance) Bed Mobility Bed Mobility: Yes (independent) Transfers Transfers:  (independent) Ambulation/Gait Ambulation/Gait: Yes Ambulation/Gait Assistance: 6: Modified independent (Device/Increase time) Ambulation Distance (Feet): 110 Feet Assistive device: Rolling walker (pt uses cane or walker at home) Gait Pattern: Trunk flexed Stairs: No Wheelchair Mobility Wheelchair Mobility: No  Posture/Postural Control Posture/Postural Control: No significant limitations Balance Balance Assessed: No Exercise     End of Session PT - End of Session Equipment Utilized During Treatment: Gait belt Activity Tolerance: Patient tolerated treatment well;Patient limited by fatigue Patient left: in chair;with call bell in reach Nurse Communication: Mobility status for ambulation;Mobility status for transfers General Behavior During Session: [facility] for tasks performed Cognition: [facility] for tasks performed PT Assessment/Plan/Recommendation PT Assessment Clinical Impression Statement: appears to be very close to functional baseline PT Recommendation/Assessment: Patent does not need any further PT services No Skilled PT: Patient at baseline level of functioning PT Goals    [name] 01/29/2011, 1:45 PM

## 2011-01-29 NOTE — Progress Notes (Signed)
Subjective: [name] is a 77 y.o. female admitted for GI bleed requiring transfusion. Found to have large gastric mass extending into duodenum bulb, likely malignant. She is also on chronic NSAIDS/ASA.  Ileocecal valve ulcer per Dr. Darrick Penna. Patient denies BM since procedure. No n/v. Tolerated clear liquids. C/O lower abdominal and epigastric pain, headache. Appears comfortable.   Objective: Vital signs in last 24 hours: Temp:  [98.2 F (36.8 C)-98.9 F (37.2 C)] 98.2 F (36.8 C) (08/02 0400) Pulse Rate:  [73-141] 73  (08/02 0400) Resp:  [17-23] 18  (08/02 0400) BP: (114-172)/(63-114) 152/84 mmHg (08/02 0400) SpO2:  [90 %-99 %] 95 % (08/02 0400) Weight:  [182 lb (82.555 kg)-202 lb 2.6 oz (91.7 kg)] 202 lb 2.6 oz (91.7 kg) (08/02 0448) ?error in one of these weights? No significant edema noted.  Last BM Date: 01/27/11 General:   Alert,  Well-developed, well-nourished, pleasant and cooperative in NAD Head:  Normocephalic and atraumatic. Eyes:  Sclera clear, no icterus.  Chest: CTA bilaterally without rales, rhonchi, crackles.    Heart:  Regular rate and rhythm; no murmurs, clicks, rubs,  or gallops. Abdomen:  Soft, nondistended. Minimal epigastric tenderness. No masses, hepatosplenomegaly or hernias noted. Normal bowel sounds, without guarding, and without rebound.   Extremities:  Without clubbing, deformity or edema. Neurologic:  Alert and  oriented x4;  grossly normal neurologically. Skin:  Intact without significant lesions or rashes. Psych:  Alert and cooperative. Normal mood and affect.  Intake/Output from previous day: 08/01 0701 - 08/02 0700 In: 1679.2 [P.O.:840; I.V.:839.2] Out: 2900 [Urine:2900] Intake/Output this shift:    Lab Results: CBC  Basename 01/29/11 0549 01/28/11 0638 01/27/11 1609  WBC 8.1 10.5 13.5*  HGB 9.2* 11.1* 10.4*  HCT 27.6* 32.6* 30.4*  MCV 81.4 80.7 79.4  PLT 299 259 267   BMET  Basename 01/29/11 0549 01/28/11 0638 01/27/11 0857  NA 133*  134* 130*  K 3.6 3.5 3.8  CL 99 99 97  CO2 22 21 19   GLUCOSE 110* 94 235*  BUN 6 14 41*  CREATININE 0.48* 0.56 0.80  CALCIUM 8.1* 8.4 8.1*        Imaging Studies: Ct Abd Wo & W Cm  01/27/2011  *RADIOLOGY REPORT*  Clinical Data: Follow-up pancreatitis.  Abdominal pain and diarrhea.  CT ABDOMEN WITHOUT AND WITH CONTRAST  Technique:  Multidetector CT imaging of the abdomen was performed following the standard protocol before and during bolus administration of intravenous contrast.  Contrast: 100 ml Omnipaque-300  Comparison: 01/20/2011  Findings: No evidence of pancreatic mass or pancreatic ductal dilatation.  Previously described soft tissue stranding adjacent to the pancreatic head is no longer visualized.  No evidence of peripancreatic fluid.  No evidence of adjacent gastric or bowel wall thickening.  A small hiatal hernia is again noted.  Mild hepatic steatosis again demonstrated, however no liver masses are identified.  Gallbladder is unremarkable.  The spleen, and adrenal glands are normal in appearance.  A 1 cm benign angiomyolipoma is again seen in the upper pole of the right kidney. Other tiny sub-centimeter low attenuation lesions in both kidneys are also stable.  No evidence of renal calculi or hydronephrosis.  No other soft tissue masses or lymphadenopathy identified within the abdomen.  No other inflammatory process or abnormal fluid collections are identified.  Diverticulosis is seen involving the visualized portion of the descending colon, however there is no evidence of diverticulitis in this region.  IMPRESSION:  1.  No CT evidence of pancreatitis or  other acute findings on today's exam. 2.  Mild hepatic steatosis. 3. Stable small hiatal hernia and left colonic diverticulosis. 4.  Stable 1 cm benign right renal angiomyolipoma.  Original Report Authenticated By: Danae Orleans, M.D.    Assessment: Principal Problem:  *Anemia: Due to GI bleed. Large gastric mass. Biopsy pending. I have  not seen official colonoscopy report but per Dr. Darrick Penna she also have ileocecal valve ulcer. H/H is stable. Patient complains of being hungry.   No evidence of pancreatitic abnormality on follow-up CT pancreas.    Plan: Will advance diet to full liquids. Follow up on biopsy. Discussed with Dr. Sherrie Mustache, will discuss case with Dr. Darrick Penna regarding obtaining inpatient surgical consultation. Further recommendations to follow.     LOS: 3 days   Tana Coast  01/29/2011, 8:09 AM

## 2011-01-29 NOTE — Progress Notes (Signed)
I spoke to Dr. Rito Ehrlich regarding patient's foley catheter and asked if he wanted it removed or not.  Dr. Rito Ehrlich stated to leave the catheter in place until the day shift doctor comes in and they will determine if the catheter should be removed.

## 2011-01-29 NOTE — Progress Notes (Signed)
Subjective: She has no complaints of abdominal pain, nausea or vomiting.  Objective: Vital signs in last 24 hours: Filed Vitals:   01/29/11 0400 01/29/11 0448 01/29/11 1339 01/29/11 1400  BP: 152/84   125/74  Pulse: 73   86  Temp: 98.2 F (36.8 C)   99 F (37.2 C)  TempSrc: Oral   Oral  Resp: 18   20  Height:      Weight:  91.7 kg (202 lb 2.6 oz)    SpO2: 95%  94% 94%   Weight change:   Intake/Output Summary (Last 24 hours) at 01/29/11 1621 Last data filed at 01/29/11 1426  Gross per 24 hour  Intake 2822.17 ml  Output   3550 ml  Net -727.83 ml   Physical Exam: General: The patient is currently lying in bed, in no acute distress P.. HEENT: EOMI. Neck: Supple CV: S1, S2 with a soft systolic murmur. Lungs: Clear to ascultation bilaterally, except decreased breath sounds in the bases. Abdomen: Soft, mild epigastric tenderness, otherwise no distention, and no guarding. Ext: Pedal pulses palpable.. Trace edema.  Lab Results:  Basename 01/29/11 0549 01/28/11 0638 01/27/11 0030  NA 133* 134* --  K 3.6 3.5 --  CL 99 99 --  CO2 22 21 --  GLUCOSE 110* 94 --  BUN 6 14 --  CREATININE 0.48* 0.56 --  CALCIUM 8.1* 8.4 --  MG -- -- 1.8  PHOS -- -- --    Basename 01/27/11 0030 01/26/11 1952  AST 15 14  ALT 15 15  ALKPHOS 59 60  BILITOT 0.1* 0.1*  PROT 5.9* 5.9*  ALBUMIN 2.4* 2.3*    Basename 01/26/11 1952  LIPASE 22  AMYLASE --    Basename 01/29/11 0549 01/28/11 0638 01/26/11 1952  WBC 8.1 10.5 --  NEUTROABS -- -- 15.2*  HGB 9.2* 11.1* --  HCT 27.6* 32.6* --  MCV 81.4 80.7 --  PLT 299 259 --    Basename 01/27/11 1609 01/27/11 0858 01/27/11  CKTOTAL 56 54 64  CKMB 3.2 3.5 3.3  CKMBINDEX -- -- --  TROPONINI <0.30 <0.30 <0.30   No results found for this basename: POCBNP:3 in the last 72 hours  Basename 01/26/11 1952  DDIMER 1.62*    Basename 01/27/11 0030  HGBA1C 7.7*   No results found for this basename:  CHOL:2,HDL:2,LDLCALC:2,TRIG:2,CHOLHDL:2,LDLDIRECT:2 in the last 72 hours No results found for this basename: TSH,T4TOTAL,FREET3,T3FREE,THYROIDAB in the last 72 hours No results found for this basename: VITAMINB12:2,FOLATE:2,FERRITIN:2,TIBC:2,IRON:2,RETICCTPCT:2 in the last 72 hours Micro Results: Recent Results (from the past 240 hour(s))  SURGICAL PCR SCREEN     Status: Normal   Collection Time   01/23/11 11:30 AM      Component Value Range Status Comment   MRSA, PCR NEGATIVE  NEGATIVE  Final    Staphylococcus aureus NEGATIVE  NEGATIVE  Final    Studies/Results: Dg Chest 2 View  01/26/2011  *RADIOLOGY REPORT*  Clinical Data: Syncope.  Dizziness.  Shortness of breath.  CHEST - 2 VIEW  Comparison: 03/29/2010  Findings: Heart size is normal.  Both lungs are clear.  No evidence of pleural effusion.  No mass or lymphadenopathy identified.  IMPRESSION: Stable exam.  No active disease.  Original Report Authenticated By: Danae Orleans, M.D.   Ct Head Wo Contrast  01/25/2011  *RADIOLOGY REPORT*  Clinical Data: Severe headache.  Dementia.  CT HEAD WITHOUT CONTRAST  Technique:  Contiguous axial images were obtained from the base of the skull through the vertex without contrast.  Comparison: 03/30/2009  Findings: There is no evidence of intracranial hemorrhage, brain edema or other signs of acute infarction.  There is no evidence of intracranial mass lesion or mass effect.  No abnormal extra-axial fluid collections are identified.  Moderate to severe cerebellar atrophy and mild cerebral atrophy are stable in appearance. Moderate chronic small vessel disease is also unchanged.  No evidence of hydrocephalus.  No skull abnormality identified.  IMPRESSION:  1.  No acute intracranial abnormality. 2.  Stable cerebral and cerebellar atrophy and chronic small vessel disease.  Original Report Authenticated By: Danae Orleans, M.D.   Ct Angio Chest W/cm &/or Wo Cm  01/26/2011  *RADIOLOGY REPORT*  Clinical Data:  Short  of breath. Tachycardia.  Syncope.  Elevated D- dimer.  CT ANGIOGRAPHY CHEST WITH CONTRAST  Technique:  Multidetector CT imaging of the chest was performed using the standard protocol during bolus administration of intravenous contrast.  Multiplanar CT image reconstructions including MIPs were obtained to evaluate the vascular anatomy.  Contrast:  100 ml Omnipaque-300  Comparison:  None.  Findings:  Satisfactory opacification of the pulmonary arteries noted, and there is no evidence of pulmonary emboli.  No evidence of thoracic aortic aneurysm or dissection. Diffuse coronary artery calcification noted.  No mediastinal or hilar masses are identified.  No adenopathy seen elsewhere within the thorax.  No evidence of pleural or pericardial effusion.  Both lungs are clear.  No evidence of pulmonary infiltrate or mass.  No endobronchial lesion identified.  Review of the MIP images confirms the above findings.  IMPRESSION:  1.  No evidence of pulmonary embolism or other acute findings. 2.  Diffuse coronary artery calcification noted.  Original Report Authenticated By: Danae Orleans, M.D.   Ct Abdomen Pelvis W Contrast  01/20/2011  *RADIOLOGY REPORT*  Clinical Data: Abdominal pain, hernia repair, right renal angiomyolipoma  CT ABDOMEN AND PELVIS WITH CONTRAST  Technique:  Multidetector CT imaging of the abdomen and pelvis was performed following the standard protocol during bolus administration of intravenous contrast.  Contrast: 100 ml Omnipaque-300 IV  Comparison: CT abdomen dated 02/18/2008  Findings: Minimal dependent atelectasis lung bases.  Liver, spleen and adrenal glands within normal limits.  Mild soft tissue stranding anterior to the pancreatic head (series 2/image 30).  Gallbladder is unremarkable. No intrahepatic ductal dilatation. Common bile duct measures 8 mm.  Tiny bilateral renal cysts.  Right upper pole angiomyolipoma.  No evidence of bowel obstruction.  Normal appendix.  Colonic diverticulosis, without  associated inflammatory changes.  Moderate stool in the rectum.  No abdominopelvic ascites.  No suspicious abdominopelvic lymphadenopathy.  Uterus unremarkable.  No adnexal masses.  Bladder is within normal limits.  Degenerative changes of the visualized thoracolumbar spine. Right hip arthroplasty.  IMPRESSION: Mild soft tissue stranding anterior to the pancreatic head, correlate for pancreatitis.  Normal appendix.  No evidence of bowel obstruction.  Additional ancillary findings as above.  Original Report Authenticated By: Charline Bills, M.D.   Medications: I have reviewed the patient's current medications. Scheduled Meds:    . acetylcysteine  600 mg Oral BID  . amLODipine  5 mg Oral Daily  . benazepril  20 mg Oral Daily  . insulin aspart  0-9 Units Subcutaneous TID WC  . insulin glargine  15 Units Subcutaneous Daily  . meperidine      . pantoprazole (PROTONIX) IV  40 mg Intravenous Q12H  . polyethylene glycol  17 g Oral Daily  . sodium chloride      . DISCONTD: bisacodyl  5 mg Oral BID  . DISCONTD: docusate sodium  100 mg Oral BID  . DISCONTD: insulin aspart  0-15 Units Subcutaneous Q4H  . DISCONTD: insulin aspart  0-5 Units Subcutaneous QHS  . DISCONTD: insulin aspart  0-9 Units Subcutaneous TID WC  . DISCONTD: insulin glargine  14 Units Subcutaneous BID  . DISCONTD: insulin glargine  15 Units Subcutaneous QAM  . DISCONTD: insulin glargine  7 Units Subcutaneous BID   Continuous Infusions:    . sodium chloride    . 0.9 % NaCl with KCl 20 mEq / L 50 mL/hr at 01/29/11 0530  . dextrose 5% lactated ringers 20 mL/hr at 01/28/11 1435   PRN Meds:.acetaminophen, acetaminophen, bisacodyl, HYDROmorphone, ondansetron (ZOFRAN) IV, ondansetron, oxyCODONE, senna-docusate, sodium phosphate, traZODone Assessment/Plan:  1. Syncope and collapse likely due to anemia. Improved with IV hydration and blood transfusion. Troponins negative x2. CT chest negative for pulmonary embolism.  2. Large  ulcerated gastric mass, likely a malignancy. This is probably the source of her GI bleeding and anemia. It was also reported that the patient underwent a colonoscopy yesterday which revealed an ileocecal mass, however I do not see the report. The patient may ultimately need a surgical consultation, however, I will defer this to the gastroenterology team. Biopsies were taken of the ulcerated gastric mass, the results are currently pending appear  3. Severe anemia. Status post 2 units of PRBC. Her hemoglobin and hematocrit did decrease since yesterday.    4. Hyponatremia. Resolving with IV hydration.  5. Type 2 diabetes currently on Lantus and sliding scale insulin. Her blood sugars are trending down some. We will decrease the dosing of Lantus to once daily rather than twice daily. We will continue holding metformin and glipizide.  8. Hypertension.  Benazepril and Norvasc have been restarted.       LOS: 3 days   [name] 01/29/2011, 4:21 PM

## 2011-01-29 NOTE — Progress Notes (Signed)
Pt with ulcerated gastric mass. Explained findings to pt. Biopsies pending. Soft mech diet ok as long as pt does not have vomiting, nausea, or abd pain. ?Surgery Consult after Bx final. Spoke with daughter, Langston Reusing, [phone]. Discussed findings and plan with her. Pt and daughter voiced her understanding. Hopefully the biopsies will be back tomorrow.

## 2011-01-30 ENCOUNTER — Encounter (HOSPITAL_COMMUNITY): Payer: Self-pay | Admitting: *Deleted

## 2011-01-30 DIAGNOSIS — B9681 Helicobacter pylori [H. pylori] as the cause of diseases classified elsewhere: Secondary | ICD-10-CM | POA: Diagnosis present

## 2011-01-30 DIAGNOSIS — K633 Ulcer of intestine: Secondary | ICD-10-CM | POA: Diagnosis present

## 2011-01-30 HISTORY — DX: Helicobacter pylori (H. pylori) as the cause of diseases classified elsewhere: B96.81

## 2011-01-30 LAB — CBC
HCT: 25.6 % — ABNORMAL LOW (ref 36.0–46.0)
MCH: 27.5 pg (ref 26.0–34.0)
MCV: 81.8 fL (ref 78.0–100.0)
RBC: 3.13 MIL/uL — ABNORMAL LOW (ref 3.87–5.11)
WBC: 6.5 10*3/uL (ref 4.0–10.5)

## 2011-01-30 LAB — GLUCOSE, CAPILLARY
Glucose-Capillary: 117 mg/dL — ABNORMAL HIGH (ref 70–99)
Glucose-Capillary: 161 mg/dL — ABNORMAL HIGH (ref 70–99)

## 2011-01-30 LAB — PREPARE RBC (CROSSMATCH)

## 2011-01-30 MED ORDER — PANTOPRAZOLE SODIUM 40 MG PO TBEC
40.0000 mg | DELAYED_RELEASE_TABLET | Freq: Two times a day (BID) | ORAL | Status: DC
  Administered 2011-01-31 (×2): 40 mg via ORAL
  Filled 2011-01-30: qty 1

## 2011-01-30 NOTE — Progress Notes (Signed)
Subjective: The patient states that she did have a little epigastric abdominal discomfort yesterday after eating. He has no complaints of abnormal pain today.  Objective: Vital signs in last 24 hours: Filed Vitals:   01/30/11 0628 01/30/11 0806 01/30/11 0954 01/30/11 1337  BP:   175/88 106/66  Pulse:  94  88  Temp:    98.3 F (36.8 C)  TempSrc:    Oral  Resp:  24  22  Height:      Weight: 93.1 kg (205 lb 4 oz)     SpO2:  94%  97%   Weight change: 10.545 kg (23 lb 4 oz)  Intake/Output Summary (Last 24 hours) at 01/30/11 1722 Last data filed at 01/30/11 1505  Gross per 24 hour  Intake   2160 ml  Output   4502 ml  Net  -2342 ml   Physical Exam: General: The patient is currently lying in bed, in no acute distress P.. HEENT: EOMI. Neck: Supple CV: S1, S2 with a soft systolic murmur. Lungs: Clear to ascultation bilaterally, except decreased breath sounds in the bases. Abdomen: Soft, mild epigastric tenderness, otherwise no distention, and no guarding. Ext: Pedal pulses palpable.. Trace edema.  Lab Results:  Basename 01/29/11 0549 01/28/11 0638  NA 133* 134*  K 3.6 3.5  CL 99 99  CO2 22 21  GLUCOSE 110* 94  BUN 6 14  CREATININE 0.48* 0.56  CALCIUM 8.1* 8.4  MG -- --  PHOS -- --   No results found for this basename: AST:2,ALT:2,ALKPHOS:2,BILITOT:2,PROT:2,ALBUMIN:2 in the last 72 hours No results found for this basename: LIPASE:2,AMYLASE:2 in the last 72 hours  Basename 01/30/11 0509 01/29/11 0549  WBC 6.5 8.1  NEUTROABS -- --  HGB 8.6* 9.2*  HCT 25.6* 27.6*  MCV 81.8 81.4  PLT 328 299   No results found for this basename: CKTOTAL:3,CKMB:3,CKMBINDEX:3,TROPONINI:3 in the last 72 hours No results found for this basename: POCBNP:3 in the last 72 hours No results found for this basename: DDIMER:2 in the last 72 hours No results found for this basename: HGBA1C:2 in the last 72 hours No results found for this basename:  CHOL:2,HDL:2,LDLCALC:2,TRIG:2,CHOLHDL:2,LDLDIRECT:2 in the last 72 hours No results found for this basename: TSH,T4TOTAL,FREET3,T3FREE,THYROIDAB in the last 72 hours No results found for this basename: VITAMINB12:2,FOLATE:2,FERRITIN:2,TIBC:2,IRON:2,RETICCTPCT:2 in the last 72 hours Micro Results: Recent Results (from the past 240 hour(s))  SURGICAL PCR SCREEN     Status: Normal   Collection Time   01/23/11 11:30 AM      Component Value Range Status Comment   MRSA, PCR NEGATIVE  NEGATIVE  Final    Staphylococcus aureus NEGATIVE  NEGATIVE  Final    Studies/Results: Dg Chest 2 View  01/26/2011  *RADIOLOGY REPORT*  Clinical Data: Syncope.  Dizziness.  Shortness of breath.  CHEST - 2 VIEW  Comparison: 03/29/2010  Findings: Heart size is normal.  Both lungs are clear.  No evidence of pleural effusion.  No mass or lymphadenopathy identified.  IMPRESSION: Stable exam.  No active disease.  Original Report Authenticated By: Danae Orleans, M.D.   Ct Head Wo Contrast  01/25/2011  *RADIOLOGY REPORT*  Clinical Data: Severe headache.  Dementia.  CT HEAD WITHOUT CONTRAST  Technique:  Contiguous axial images were obtained from the base of the skull through the vertex without contrast.  Comparison: 03/30/2009  Findings: There is no evidence of intracranial hemorrhage, brain edema or other signs of acute infarction.  There is no evidence of intracranial mass lesion or mass effect.  No abnormal extra-axial fluid collections are identified.  Moderate to severe cerebellar atrophy and mild cerebral atrophy are stable in appearance. Moderate chronic small vessel disease is also unchanged.  No evidence of hydrocephalus.  No skull abnormality identified.  IMPRESSION:  1.  No acute intracranial abnormality. 2.  Stable cerebral and cerebellar atrophy and chronic small vessel disease.  Original Report Authenticated By: Danae Orleans, M.D.   Ct Angio Chest W/cm &/or Wo Cm  01/26/2011  *RADIOLOGY REPORT*  Clinical Data:  Short  of breath. Tachycardia.  Syncope.  Elevated D- dimer.  CT ANGIOGRAPHY CHEST WITH CONTRAST  Technique:  Multidetector CT imaging of the chest was performed using the standard protocol during bolus administration of intravenous contrast.  Multiplanar CT image reconstructions including MIPs were obtained to evaluate the vascular anatomy.  Contrast:  100 ml Omnipaque-300  Comparison:  None.  Findings:  Satisfactory opacification of the pulmonary arteries noted, and there is no evidence of pulmonary emboli.  No evidence of thoracic aortic aneurysm or dissection. Diffuse coronary artery calcification noted.  No mediastinal or hilar masses are identified.  No adenopathy seen elsewhere within the thorax.  No evidence of pleural or pericardial effusion.  Both lungs are clear.  No evidence of pulmonary infiltrate or mass.  No endobronchial lesion identified.  Review of the MIP images confirms the above findings.  IMPRESSION:  1.  No evidence of pulmonary embolism or other acute findings. 2.  Diffuse coronary artery calcification noted.  Original Report Authenticated By: Danae Orleans, M.D.   Ct Abdomen Pelvis W Contrast  01/20/2011  *RADIOLOGY REPORT*  Clinical Data: Abdominal pain, hernia repair, right renal angiomyolipoma  CT ABDOMEN AND PELVIS WITH CONTRAST  Technique:  Multidetector CT imaging of the abdomen and pelvis was performed following the standard protocol during bolus administration of intravenous contrast.  Contrast: 100 ml Omnipaque-300 IV  Comparison: CT abdomen dated 02/18/2008  Findings: Minimal dependent atelectasis lung bases.  Liver, spleen and adrenal glands within normal limits.  Mild soft tissue stranding anterior to the pancreatic head (series 2/image 30).  Gallbladder is unremarkable. No intrahepatic ductal dilatation. Common bile duct measures 8 mm.  Tiny bilateral renal cysts.  Right upper pole angiomyolipoma.  No evidence of bowel obstruction.  Normal appendix.  Colonic diverticulosis, without  associated inflammatory changes.  Moderate stool in the rectum.  No abdominopelvic ascites.  No suspicious abdominopelvic lymphadenopathy.  Uterus unremarkable.  No adnexal masses.  Bladder is within normal limits.  Degenerative changes of the visualized thoracolumbar spine. Right hip arthroplasty.  IMPRESSION: Mild soft tissue stranding anterior to the pancreatic head, correlate for pancreatitis.  Normal appendix.  No evidence of bowel obstruction.  Additional ancillary findings as above.  Original Report Authenticated By: Charline Bills, M.D.   Medications: I have reviewed the patient's current medications. Scheduled Meds:    . amLODipine  5 mg Oral Daily  . benazepril  20 mg Oral Daily  . insulin aspart  0-9 Units Subcutaneous TID WC  . insulin glargine  15 Units Subcutaneous Daily  . pantoprazole  40 mg Oral BID AC  . pantoprazole  40 mg Oral BID AC  . polyethylene glycol  17 g Oral Daily   Continuous Infusions:    . DISCONTD: sodium chloride    . DISCONTD: 0.9 % NaCl with KCl 20 mEq / L 50 mL/hr at 01/29/11 1639  . DISCONTD: dextrose 5% lactated ringers 20 mL/hr at 01/28/11 1435   PRN Meds:.acetaminophen, HYDROmorphone, ondansetron (ZOFRAN) IV, ondansetron,  oxyCODONE, senna-docusate, sodium phosphate, traZODone, DISCONTD: acetaminophen, DISCONTD: bisacodyl Assessment/Plan:  1. Syncope and collapse likely due to anemia. Improved with IV hydration and blood transfusion. Troponins negative x2. CT chest negative for pulmonary embolism. CT of the head revealed no acute abnormalities. She may have mild dementia.  2. Large ulcerated gastric mass, per EGD on 01/29/11, by Dr. Darrick Penna.  This is  likely a malignancy. This is probably the source of her GI bleeding and anemia.   Biopsies were taken of the ulcerated gastric mass, the results are currently pending, except there is evidence of H. Pylori infection. Treatment will be per GI. Per Dr Darrick Penna' note, Pylera is not available and the patient  has a PCN allergy.  3. 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  4. Severe anemia. Status post 2 units of PRBC. Her hemoglobin and hematocrit are trending downward, since transfusion. We will type and screen 2 more units of blood and transfuse more when applicable.  5. Hyponatremia. Resolving with IV hydration. Continue to monitor. Will d/c IVF and continue to monitor.  6. Type 2 diabetes currently on Lantus and sliding scale insulin. Her blood sugars were trending down some, but is now trending back up. We will increase the dose of Lantus again after it was decreased 2 days ago.    We will continue holding metformin and glipizide.  8. Hypertension.  Benazepril and Norvasc have been restarted.       LOS: 4 days   Kipp Shank 01/30/2011, 5:22 PM

## 2011-01-30 NOTE — Progress Notes (Signed)
  Subjective: [name] is a 77 y.o. female with gi bleed secondary to gastric mass and ICV ulcer. BM yesterday, no report of melena, brbpr. Patient denies melena or brbpr. No n/v. Enjoyed breakfast. Some epigastric pain.  Objective: Vital signs in last 24 hours: Temp:  [98.8 F (37.1 C)-99 F (37.2 C)] 98.8 F (37.1 C) (08/03 0400) Pulse Rate:  [63-86] 79  (08/03 0400) Resp:  [18-20] 18  (08/03 0400) BP: (116-132)/(66-76) 132/76 mmHg (08/03 0400) SpO2:  [94 %] 94 % (08/03 0400) Weight:  [205 lb 4 oz (93.1 kg)] 205 lb 4 oz (93.1 kg) (08/03 0628) Last BM Date: 01/28/11 General:   Alert,  Well-developed, well-nourished, pleasant and cooperative in NAD Head:  Normocephalic and atraumatic. Eyes:  Sclera clear, no icterus.  Chest: CTA bilaterally without rales, rhonchi, crackles.    Heart:  Regular rate and rhythm; no murmurs, clicks, rubs,  or gallops. Abdomen:  Soft, mild epigastric tenderness, nondistended. No masses, hepatosplenomegaly or hernias noted. Normal bowel sounds, without guarding, and without rebound.   Extremities:  Without clubbing, deformity or edema. Neurologic:  Alert and  oriented x4;  grossly normal neurologically. Skin:  Intact without significant lesions or rashes. Psych:  Alert and cooperative. Normal mood and affect.  Intake/Output from previous day: 08/02 0701 - 08/03 0700 In: 1523 [P.O.:1080; I.V.:443] Out: 4050 [Urine:4050] Intake/Output this shift:    Lab Results: CBC  Basename 01/30/11 0509 01/29/11 0549 01/28/11 0638  WBC 6.5 8.1 10.5  HGB 8.6* 9.2* 11.1*  HCT 25.6* 27.6* 32.6*  MCV 81.8 81.4 80.7  PLT 328 299 259   BMET  Basename 01/29/11 0549 01/28/11 0638 01/27/11 0857  NA 133* 134* 130*  K 3.6 3.5 3.8  CL 99 99 97  CO2 22 21 19   GLUCOSE 110* 94 235*  BUN 6 14 41*  CREATININE 0.48* 0.56 0.80  CALCIUM 8.1* 8.4 8.1*    Gastrin level: 330 High  Gastric pathology: pending   Assessment:  1. Anemia Due to GI bleed. Large gastric  mass. Biopsy pending. Likely malignancy but patient also on NSAIDS. H/H trickled down some. Gastrin level elevated. 2. Ileocecal valve ulcer. Official colonoscopy report now in chart.  3. No evidence of pancreatitic abnormality on follow-up CT pancreas.    Plan: 1. Soft mechanical diet, carb modified. 2. Follow up on biopsy.  3. Recommend monitor H/H and transfuse as needed.  4. Dr. Karilyn Cota will be seeing patient starting 01/31/11, covering for Dr. Truitt Leep. Jena Gauss.   LOS: 4 days   Tana Coast  01/30/2011, 7:51 AM

## 2011-01-30 NOTE — Progress Notes (Signed)
Discussed Bx w/ Dr. Verdie Mosher. Pt has definite H. Pylori infection. Awaiting special stains and deeper cuts to assess for presence of malignancy. Pt tolerating soft mech diet. Hb trending down. PT HAS PCN ALLERGY. Unable to start Pylera because it is not on formulary @ to [place]. Continue BID PPI. Await final Bx report.

## 2011-01-31 LAB — GLUCOSE, CAPILLARY

## 2011-01-31 LAB — BASIC METABOLIC PANEL
Calcium: 8.1 mg/dL — ABNORMAL LOW (ref 8.4–10.5)
Creatinine, Ser: 0.8 mg/dL (ref 0.50–1.10)
GFR calc Af Amer: >60 mL/min (ref >60)

## 2011-01-31 LAB — CBC
HCT: 25.8 % — ABNORMAL LOW (ref 36.0–46.0)
Hemoglobin: 8.7 g/dL — ABNORMAL LOW (ref 12.0–15.0)
MCH: 27.2 pg (ref 26.0–34.0)
MCHC: 33.7 g/dL (ref 30.0–36.0)
MCV: 82.6 fL (ref 78.0–100.0)
Platelets: 335 10*3/uL (ref 150–400)
RBC: 3.04 MIL/uL — ABNORMAL LOW (ref 3.87–5.11)
RDW: 17.9 % — ABNORMAL HIGH (ref 11.5–15.5)

## 2011-01-31 LAB — PREPARE RBC (CROSSMATCH)

## 2011-01-31 MED ORDER — SODIUM CHLORIDE 0.9 % IV SOLN
INTRAVENOUS | Status: DC
  Administered 2011-01-31: 1000 mL via INTRAVENOUS

## 2011-01-31 MED ORDER — FUROSEMIDE 10 MG/ML IJ SOLN
20.0000 mg | Freq: Once | INTRAMUSCULAR | Status: AC
  Administered 2011-01-31: 20 mg via INTRAVENOUS
  Filled 2011-01-31: qty 2

## 2011-01-31 MED ORDER — SODIUM CHLORIDE 0.9 % IJ SOLN
INTRAMUSCULAR | Status: AC
  Filled 2011-01-31: qty 10

## 2011-01-31 NOTE — Progress Notes (Signed)
Subjective; Patient continues to complain of epigastric and midabdominal pain; Complains of anorexia but denies nausea or vomiting.; She had a bowel movement yesterday but didn't look there was any blood in it. Objective; BP 126/80  Pulse 91  Temp(Src) 98.9 F (37.2 C) (Oral)  Resp 18  Ht 5' 6.5" (1.689 m)  Wt 196 lb 3.4 oz (89 kg)  BMI 31.19 kg/m2  SpO2 92% Conjunctiva is pale; sclera nonicteric.; Abdomen is full soft with mild peri-umbilicus and epigastric tenderness; no organomegaly or masses noted. Lab data; Hemoglobin 6.1 hematocrit 18.5, Globin was 8.6 and hematocrit 25.6 yesterday. BUN 23, creatinine 0.8 serum BUN was 6 two days ago. Assessment;  Suspect she is slowly losing blood from gastric lesion; preliminary biopsies did not show neoplasm; deeper cuts are pending. Patient's BUN has increased from 6 to 23 in the last 2 days possibly due to blood loss from UGI.  Small acute ulcer at ileocecal valve was placed secondary to NSAID Use. Agree with plans to do her 2 more units of PRBCs today. If we cannot maintain her hemoglobin above 8 g they have to consider surgical consultation. May also consider repeating EGD.

## 2011-01-31 NOTE — Progress Notes (Signed)
Subjective: The patient states that she did have a little epigastric abdominal discomfort after eating. Nursing reports that she had a bowel movement early this morning that was brown.  Objective: Vital signs in last 24 hours: Filed Vitals:   01/31/11 1132 01/31/11 1230 01/31/11 1332 01/31/11 1402  BP: 126/80 110/70 120/82 142/79  Pulse: 91 99 89 108  Temp: 98.9 F (37.2 C) 99 F (37.2 C) 98.7 F (37.1 C) 98.4 F (36.9 C)  TempSrc: Oral Oral Oral Oral  Resp: 18 20 18 19   Height:      Weight:      SpO2:       Weight change: -4.1 kg (-9 lb 0.6 oz)  Intake/Output Summary (Last 24 hours) at 01/31/11 1411 Last data filed at 01/31/11 1300  Gross per 24 hour  Intake   3050 ml  Output    625 ml  Net   2425 ml   Physical Exam: General: The patient is currently lying in bed, in no acute distress. HEENT: EOMI. Neck: Supple CV: S1, S2 with a soft systolic murmur. Lungs: Clear to ascultation bilaterally, except decreased breath sounds in the bases. Abdomen: Soft, mild epigastric tenderness, otherwise no distention, and no guarding. Ext: Pedal pulses palpable. Trace edema.  Lab Results:  Basename 01/31/11 0425 01/29/11 0549  NA 132* 133*  K 4.4 3.6  CL 96 99  CO2 25 22  GLUCOSE 166* 110*  BUN 23 6  CREATININE 0.80 0.48*  CALCIUM 8.1* 8.1*  MG -- --  PHOS -- --   No results found for this basename: AST:2,ALT:2,ALKPHOS:2,BILITOT:2,PROT:2,ALBUMIN:2 in the last 72 hours No results found for this basename: LIPASE:2,AMYLASE:2 in the last 72 hours  Basename 01/31/11 0425 01/30/11 0509  WBC 11.7* 6.5  NEUTROABS -- --  HGB 6.1* 8.6*  HCT 18.5* 25.6*  MCV 82.6 81.8  PLT 335 328   No results found for this basename: CKTOTAL:3,CKMB:3,CKMBINDEX:3,TROPONINI:3 in the last 72 hours No results found for this basename: POCBNP:3 in the last 72 hours No results found for this basename: DDIMER:2 in the last 72 hours No results found for this basename: HGBA1C:2 in the last 72 hours No  results found for this basename: CHOL:2,HDL:2,LDLCALC:2,TRIG:2,CHOLHDL:2,LDLDIRECT:2 in the last 72 hours No results found for this basename: TSH,T4TOTAL,FREET3,T3FREE,THYROIDAB in the last 72 hours No results found for this basename: VITAMINB12:2,FOLATE:2,FERRITIN:2,TIBC:2,IRON:2,RETICCTPCT:2 in the last 72 hours Micro Results: Recent Results (from the past 240 hour(s))  SURGICAL PCR SCREEN     Status: Normal   Collection Time   01/23/11 11:30 AM      Component Value Range Status Comment   MRSA, PCR NEGATIVE  NEGATIVE  Final    Staphylococcus aureus NEGATIVE  NEGATIVE  Final    Studies/Results: Dg Chest 2 View  01/26/2011  *RADIOLOGY REPORT*  Clinical Data: Syncope.  Dizziness.  Shortness of breath.  CHEST - 2 VIEW  Comparison: 03/29/2010  Findings: Heart size is normal.  Both lungs are clear.  No evidence of pleural effusion.  No mass or lymphadenopathy identified.  IMPRESSION: Stable exam.  No active disease.  Original Report Authenticated By: Danae Orleans, M.D.   Ct Head Wo Contrast  01/25/2011  *RADIOLOGY REPORT*  Clinical Data: Severe headache.  Dementia.  CT HEAD WITHOUT CONTRAST  Technique:  Contiguous axial images were obtained from the base of the skull through the vertex without contrast.  Comparison: 03/30/2009  Findings: There is no evidence of intracranial hemorrhage, brain edema or other signs of acute infarction.  There is  no evidence of intracranial mass lesion or mass effect.  No abnormal extra-axial fluid collections are identified.  Moderate to severe cerebellar atrophy and mild cerebral atrophy are stable in appearance. Moderate chronic small vessel disease is also unchanged.  No evidence of hydrocephalus.  No skull abnormality identified.  IMPRESSION:  1.  No acute intracranial abnormality. 2.  Stable cerebral and cerebellar atrophy and chronic small vessel disease.  Original Report Authenticated By: Danae Orleans, M.D.   Ct Angio Chest W/cm &/or Wo Cm  01/26/2011   *RADIOLOGY REPORT*  Clinical Data:  Short of breath. Tachycardia.  Syncope.  Elevated D- dimer.  CT ANGIOGRAPHY CHEST WITH CONTRAST  Technique:  Multidetector CT imaging of the chest was performed using the standard protocol during bolus administration of intravenous contrast.  Multiplanar CT image reconstructions including MIPs were obtained to evaluate the vascular anatomy.  Contrast:  100 ml Omnipaque-300  Comparison:  None.  Findings:  Satisfactory opacification of the pulmonary arteries noted, and there is no evidence of pulmonary emboli.  No evidence of thoracic aortic aneurysm or dissection. Diffuse coronary artery calcification noted.  No mediastinal or hilar masses are identified.  No adenopathy seen elsewhere within the thorax.  No evidence of pleural or pericardial effusion.  Both lungs are clear.  No evidence of pulmonary infiltrate or mass.  No endobronchial lesion identified.  Review of the MIP images confirms the above findings.  IMPRESSION:  1.  No evidence of pulmonary embolism or other acute findings. 2.  Diffuse coronary artery calcification noted.  Original Report Authenticated By: Danae Orleans, M.D.   Ct Abdomen Pelvis W Contrast  01/20/2011  *RADIOLOGY REPORT*  Clinical Data: Abdominal pain, hernia repair, right renal angiomyolipoma  CT ABDOMEN AND PELVIS WITH CONTRAST  Technique:  Multidetector CT imaging of the abdomen and pelvis was performed following the standard protocol during bolus administration of intravenous contrast.  Contrast: 100 ml Omnipaque-300 IV  Comparison: CT abdomen dated 02/18/2008  Findings: Minimal dependent atelectasis lung bases.  Liver, spleen and adrenal glands within normal limits.  Mild soft tissue stranding anterior to the pancreatic head (series 2/image 30).  Gallbladder is unremarkable. No intrahepatic ductal dilatation. Common bile duct measures 8 mm.  Tiny bilateral renal cysts.  Right upper pole angiomyolipoma.  No evidence of bowel obstruction.  Normal  appendix.  Colonic diverticulosis, without associated inflammatory changes.  Moderate stool in the rectum.  No abdominopelvic ascites.  No suspicious abdominopelvic lymphadenopathy.  Uterus unremarkable.  No adnexal masses.  Bladder is within normal limits.  Degenerative changes of the visualized thoracolumbar spine. Right hip arthroplasty.  IMPRESSION: Mild soft tissue stranding anterior to the pancreatic head, correlate for pancreatitis.  Normal appendix.  No evidence of bowel obstruction.  Additional ancillary findings as above.  Original Report Authenticated By: Charline Bills, M.D.   Medications: I have reviewed the patient's current medications. Scheduled Meds:    . amLODipine  5 mg Oral Daily  . benazepril  20 mg Oral Daily  . furosemide  20 mg Intravenous Once  . insulin aspart  0-9 Units Subcutaneous TID WC  . insulin glargine  15 Units Subcutaneous Daily  . pantoprazole  40 mg Oral BID AC  . pantoprazole  40 mg Oral BID AC  . polyethylene glycol  17 g Oral Daily  . sodium chloride       Continuous Infusions:    . sodium chloride    . DISCONTD: 0.9 % NaCl with KCl 20 mEq / L 50  mL/hr at 01/29/11 1639   PRN Meds:.acetaminophen, HYDROmorphone, ondansetron (ZOFRAN) IV, ondansetron, oxyCODONE, senna-docusate, sodium phosphate, traZODone Assessment/Plan:  1. Syncope and collapse likely due to anemia. Improved with IV hydration and blood transfusion. Troponins negative x2. CT chest negative for pulmonary embolism. CT of the head revealed no acute abnormalities. She may have mild dementia.  2. Large ulcerated gastric mass, per EGD on 01/29/11, by Dr. Darrick Penna.  This is probably the source of her GI bleeding and anemia.   Biopsies were taken of the ulcerated gastric mass. The preliminary results revealed no malignancy, however, deeper biopsies were obtained, and the pathology report is not back on these yet. There is evidence of H. Pylori infection. Treatment will be per GI. Per Dr Darrick Penna'  note, Pylera is not available and the patient has a PCN allergy. Her hemoglobin has decreased progressively over the past couple of days. It is likely, that she is having ongoing oozing from this gastric ulcer or mass. Dr. Patty Sermons assessment noted. The patient may ultimately need a surgical consultation.  3. Ileocecal valve ulcer, per colonoscopy on 01/28/11; NSAID-induced..  4. Severe anemia. Status post 2 units of PRBC. Her hemoglobin and hematocrit are trending downward, since the initial transfusions.   5. Hyponatremia with azotemia. Given her decrease in serum sodium and increase in BUN, we will need to restart gentle IV fluids.  6. Type 2 diabetes currently on Lantus and sliding scale insulin. Her blood sugars were trending down some, but is now trending back up. We will increase the dose of Lantus again after it was decreased 2 days ago.    We will continue holding metformin and glipizide.  8. Hypertension.  On Benazepril and Norvasc. They were held this morning by the nurse because her blood pressure was on the low normal side.    Plan: 1. We will restart gentle IV fluids with normal saline. 2. We will transfuse another 2 units of packed red blood cells today. We will monitor her CBC every 6 hours over the next 36 hours and daily thereafter or as needed. 3. We will await the pathology results of the deeper cuts of the gastric ulcer/tumor. Surgical consultation may be needed, however, he will first confer with the GI team.       LOS: 5 days   Amiri Riechers 01/31/2011, 2:11 PM

## 2011-02-01 LAB — GLUCOSE, CAPILLARY
Glucose-Capillary: 137 mg/dL — ABNORMAL HIGH (ref 70–99)
Glucose-Capillary: 169 mg/dL — ABNORMAL HIGH (ref 70–99)
Glucose-Capillary: 148 mg/dL — ABNORMAL HIGH (ref 70–99)

## 2011-02-01 LAB — CBC
Hemoglobin: 8.4 g/dL — ABNORMAL LOW (ref 12.0–15.0)
MCH: 28.9 pg (ref 26.0–34.0)
MCH: 29.2 pg (ref 26.0–34.0)
MCHC: 33.6 g/dL (ref 30.0–36.0)
Platelets: 307 10*3/uL (ref 150–400)
Platelets: 359 10*3/uL (ref 150–400)
Platelets: 339 10*3/uL (ref 150–400)
RBC: 2.6 MIL/uL — ABNORMAL LOW (ref 3.87–5.11)
RBC: 2.82 MIL/uL — ABNORMAL LOW (ref 3.87–5.11)
RBC: 2.91 MIL/uL — ABNORMAL LOW (ref 3.87–5.11)
RDW: 16.6 % — ABNORMAL HIGH (ref 11.5–15.5)
WBC: 11.2 10*3/uL — ABNORMAL HIGH (ref 4.0–10.5)
WBC: 9.6 10*3/uL (ref 4.0–10.5)
WBC: 9.9 10*3/uL (ref 4.0–10.5)

## 2011-02-01 NOTE — Progress Notes (Signed)
Notified Dr. Lendell Caprice of pt HBG this am.  No new orderes at this time.

## 2011-02-01 NOTE — Progress Notes (Signed)
Discussed with Dr. Karilyn Cota.  Chart reviewed.  Subjective: Melena last night.  No other complaints Objective: Vital signs in last 24 hours: Filed Vitals:   02/01/11 0530 02/01/11 1000 02/01/11 1500 02/01/11 1852  BP: 157/82 148/95 134/63   Pulse: 104 98 106   Temp: 98.4 F (36.9 C) 98.3 F (36.8 C) 98 F (36.7 C)   TempSrc: Oral Oral Oral   Resp: 20 20 20    Height:      Weight:      SpO2: 93% 97% 100% 99%   Weight change:   Intake/Output Summary (Last 24 hours) at 02/01/11 1918 Last data filed at 02/01/11 1800  Gross per 24 hour  Intake   1040 ml  Output   1400 ml  Net   -360 ml   General appearance: alert and no distress Lungs: clear to auscultation bilaterally Heart: regular rate and rhythm, S1, S2 normal, no murmur, click, rub or gallop Abdomen: soft, non-tender; bowel sounds normal; no masses,  no organomegaly Extremities: edema 1+ Lab Results: Basic Metabolic Panel:  Basename 01/31/11 0425  NA 132*  K 4.4  CL 96  CO2 25  GLUCOSE 166*  BUN 23  CREATININE 0.80  CALCIUM 8.1*  MG --  PHOS --   Basename 02/01/11 1805 02/01/11 1235  WBC 9.6 9.9  NEUTROABS -- --  HGB 7.6* 8.3*  HCT 22.4* 24.3*  MCV 86.2 86.2  PLT 339 359    Basename 02/01/11 1654 02/01/11 1127 02/01/11 0749 01/31/11 2110 01/31/11 1616 01/31/11 1130  GLUCAP 148* 169* 137* 154* 159* 163*   Micro Results: Recent Results (from the past 240 hour(s))  SURGICAL PCR SCREEN     Status: Normal   Collection Time   01/23/11 11:30 AM      Component Value Range Status Comment   MRSA, PCR NEGATIVE  NEGATIVE  Final    Staphylococcus aureus NEGATIVE  NEGATIVE  Final     Scheduled Meds:   . amLODipine  5 mg Oral Daily  . benazepril  20 mg Oral Daily  . insulin aspart  0-9 Units Subcutaneous TID WC  . insulin glargine  15 Units Subcutaneous Daily  . pantoprazole  40 mg Oral BID AC  . pantoprazole  40 mg Oral BID AC  . polyethylene glycol  17 g Oral Daily   Continuous Infusions:   . sodium  chloride 1,000 mL (01/31/11 2135)   PRN Meds:.acetaminophen, HYDROmorphone, ondansetron (ZOFRAN) IV, ondansetron, oxyCODONE, senna-docusate, sodium phosphate, traZODone Assessment/Plan: Patient Active Hospital Problem List: Anemia (01/26/2011):  Monitor. Transfuse PRN  DIABETES MELLITUS, TYPE II (09/13/2007): stable  HYPERTENSION (09/13/2007):  Stable Syncope and collapse (01/26/2011) Hepatic steatosis (01/28/2011) Hyponatremia (01/28/2011): repeat labs in am Gastric mass (01/29/2011):  Await path H pylori ulcer (01/30/2011) Ulcer, colon (01/30/2011)    LOS: 6 days   Adaley Kiene L 02/01/2011, 7:18 PM

## 2011-02-01 NOTE — Progress Notes (Signed)
Subjective; Patient reports no change in mid abdominal pain. He does not have a good appetite but denies nausea or vomiting. Stools have been black not large volume . Objective ; BP 157/82  Pulse 104  Temp(Src) 98.4 F (36.9 C) (Oral)  Resp 20  Ht 5' 6.5" (1.689 m)  Wt 196 lb 3.4 oz (89 kg)  BMI 31.19 kg/m2  SpO2 93% Abdomen is soft with mild tenderness at mid abdomen. No organomegaly or masses.. Lab data; H&H( from 12:35 PM)  8.3 and 24.3. Assessment; Suspect intermittent bleeding secondary gastric lesion. Final path is pending; she probably would need surgical intervention. Will continue to monitor H&H. She may need to be transferred to [facility] for surgical intervention. Dr. Darrick Penna and be seing patient in a.m.

## 2011-02-02 ENCOUNTER — Telehealth: Payer: Self-pay | Admitting: Gastroenterology

## 2011-02-02 DIAGNOSIS — K279 Peptic ulcer, site unspecified, unspecified as acute or chronic, without hemorrhage or perforation: Secondary | ICD-10-CM

## 2011-02-02 DIAGNOSIS — K319 Disease of stomach and duodenum, unspecified: Secondary | ICD-10-CM

## 2011-02-02 LAB — HEMOGLOBIN AND HEMATOCRIT, BLOOD
HCT: 22.5 % — ABNORMAL LOW (ref 36.0–46.0)
HCT: 29.6 % — ABNORMAL LOW (ref 36.0–46.0)
Hemoglobin: 7.5 g/dL — ABNORMAL LOW (ref 12.0–15.0)

## 2011-02-02 LAB — GLUCOSE, CAPILLARY: Glucose-Capillary: 145 mg/dL — ABNORMAL HIGH (ref 70–99)

## 2011-02-02 LAB — BASIC METABOLIC PANEL
CO2: 23 mEq/L (ref 19–32)
Calcium: 8.1 mg/dL — ABNORMAL LOW (ref 8.4–10.5)
Creatinine, Ser: 0.55 mg/dL (ref 0.50–1.10)

## 2011-02-02 MED ORDER — SODIUM CHLORIDE 0.9 % IV SOLN: INTRAVENOUS | Status: DC

## 2011-02-02 MED ORDER — FUROSEMIDE 40 MG PO TABS
40.0000 mg | ORAL_TABLET | Freq: Once | ORAL | Status: AC
  Administered 2011-02-02: 40 mg via ORAL
  Filled 2011-02-02: qty 1

## 2011-02-02 MED ORDER — SODIUM CHLORIDE 0.9 % IV SOLN
Freq: Once | INTRAVENOUS | Status: AC
  Administered 2011-02-02: 15:00:00 via INTRAVENOUS

## 2011-02-02 MED ORDER — TRAZODONE HCL 50 MG PO TABS
50.0000 mg | ORAL_TABLET | Freq: Every day | ORAL | Status: DC
  Administered 2011-02-02 – 2011-02-03 (×2): 50 mg via ORAL
  Filled 2011-02-02 (×2): qty 1

## 2011-02-02 NOTE — Telephone Encounter (Signed)
DISCUSSED AS INPT. Pt having surgery in AUG 2012. TCS in 10-15 years if the benefits outweigh the risks. HIGH FIBER DIET. PT HAS H. PYLORI GASTRITIS. WILL TREAT AS OP. PT HAS PCN ALLERGY & PYLERA IS NOT ON FORMULARY.

## 2011-02-02 NOTE — Consult Note (Signed)
Reason for Consult: Bleeding gastric mass Referring Physician: [facility](Dr. Darrick Penna)  [name] is an 77 y.o. female.  HPI: Patient presented to [facility] after a syncopal event. She was admitted for continued management and intervention. It was noted that she was anemic. Workup was undertaken which included upper endoscopy. This demonstrated a distal gastric mass as a likely source. Clinically patient continues to demonstrate signs of active bleeding. She's responded appropriately to blood transfusions but continues to have a persistent decline in hemoglobin levels. Biopsies of the gastric mass and failed to demonstrate any evidence of neoplastic changes.  Patient continues to have epigastric abdominal pain. She describes it as colicky. She denies any significant change with oral intake. She has had some nausea but no emesis. Bowel movements have remained loose infrequently dark and melenic in description. She denies any chest pain. No shortness of breath. No fever chills. No significant family history of gastrointestinal diseases noted. Patient denies any significant use of nonsteroidal anti-inflammatories medication.  Past Medical History  Diagnosis Date  . Hypertension   . Reflux   . Right elbow pain     OTIF  . Dementia   . Depression   . Osteoarthritis   . Pancreatitis 11/2007    HOP  . Angiomyolipoma of kidney     right  . Ulcerative esophagitis 12/05/2007    hx elevated gastrin, severe on EGD by Dr Jena Gauss , h pylori negative  . Hiatal hernia   . S/P colonoscopy 2009    pt reports normal by Dr Lovell Sheehan  . Diabetes mellitus   . Anemia     Past Surgical History  Procedure Date  . Orif right hip 1999    [facility]  . Umbilical hernia repair 26 months old    [place]    Family History  Problem Relation Age of Onset  . Cancer Mother     pelvic     Social History:  reports that she has never smoked. Her smokeless tobacco use includes Chew. She  reports that she does not drink alcohol or use illicit drugs.  Allergies:  Allergies  Allergen Reactions  . Other Swelling    Pinto beans cause swelling and hives  . Penicillins Swelling    Medications: I have reviewed the patient's current medications.  Results for orders placed during the hospital encounter of 01/26/11 (from the past 48 hour(s))  GLUCOSE, CAPILLARY     Status: Abnormal   Collection Time   01/31/11 11:30 AM      Component Value Range Comment   Glucose-Capillary 163 (*) 70 - 99 (mg/dL)    Comment 1 Notify RN     GLUCOSE, CAPILLARY     Status: Abnormal   Collection Time   01/31/11  4:16 PM      Component Value Range Comment   Glucose-Capillary 159 (*) 70 - 99 (mg/dL)    Comment 1 Notify RN      Comment 2 Documented in Chart     CBC     Status: Abnormal   Collection Time   01/31/11  5:35 PM      Component Value Range Comment   WBC 12.1 (*) 4.0 - 10.5 (K/uL)    RBC 3.04 (*) 3.87 - 5.11 (MIL/uL)    Hemoglobin 8.7 (*) 12.0 - 15.0 (g/dL) POST TRANSFUSION SPECIMEN   HCT 25.8 (*) 36.0 - 46.0 (%)    MCV 84.9  78.0 - 100.0 (fL)    MCH 28.6  26.0 - 34.0 (  pg)    MCHC 33.7  30.0 - 36.0 (g/dL)    RDW 40.9 (*) 81.1 - 15.5 (%)    Platelets 327  150 - 400 (K/uL)   GLUCOSE, CAPILLARY     Status: Abnormal   Collection Time   01/31/11  9:10 PM      Component Value Range Comment   Glucose-Capillary 154 (*) 70 - 99 (mg/dL)    Comment 1 Notify RN      Comment 2 Documented in Chart     CBC     Status: Abnormal   Collection Time   02/01/11 12:01 AM      Component Value Range Comment   WBC 11.2 (*) 4.0 - 10.5 (K/uL)    RBC 2.91 (*) 3.87 - 5.11 (MIL/uL)    Hemoglobin 8.4 (*) 12.0 - 15.0 (g/dL)    HCT 91.4 (*) 78.2 - 46.0 (%)    MCV 84.5  78.0 - 100.0 (fL)    MCH 28.9  26.0 - 34.0 (pg)    MCHC 34.1  30.0 - 36.0 (g/dL)    RDW 95.6 (*) 21.3 - 15.5 (%)    Platelets 315  150 - 400 (K/uL)   CBC     Status: Abnormal   Collection Time   02/01/11  6:45 AM      Component Value Range  Comment   WBC 8.4  4.0 - 10.5 (K/uL)    RBC 2.66 (*) 3.87 - 5.11 (MIL/uL)    Hemoglobin 7.6 (*) 12.0 - 15.0 (g/dL)    HCT 08.6 (*) 57.8 - 46.0 (%)    MCV 85.0  78.0 - 100.0 (fL)    MCH 28.6  26.0 - 34.0 (pg)    MCHC 33.6  30.0 - 36.0 (g/dL)    RDW 46.9 (*) 62.9 - 15.5 (%)    Platelets 307  150 - 400 (K/uL)   GLUCOSE, CAPILLARY     Status: Abnormal   Collection Time   02/01/11  7:49 AM      Component Value Range Comment   Glucose-Capillary 137 (*) 70 - 99 (mg/dL)   GLUCOSE, CAPILLARY     Status: Abnormal   Collection Time   02/01/11 11:27 AM      Component Value Range Comment   Glucose-Capillary 169 (*) 70 - 99 (mg/dL)   CBC     Status: Abnormal   Collection Time   02/01/11 12:35 PM      Component Value Range Comment   WBC 9.9  4.0 - 10.5 (K/uL)    RBC 2.82 (*) 3.87 - 5.11 (MIL/uL)    Hemoglobin 8.3 (*) 12.0 - 15.0 (g/dL)    HCT 52.8 (*) 41.3 - 46.0 (%)    MCV 86.2  78.0 - 100.0 (fL)    MCH 29.4  26.0 - 34.0 (pg)    MCHC 34.2  30.0 - 36.0 (g/dL)    RDW 24.4 (*) 01.0 - 15.5 (%)    Platelets 359  150 - 400 (K/uL)   GLUCOSE, CAPILLARY     Status: Abnormal   Collection Time   02/01/11  4:54 PM      Component Value Range Comment   Glucose-Capillary 148 (*) 70 - 99 (mg/dL)   CBC     Status: Abnormal   Collection Time   02/01/11  6:05 PM      Component Value Range Comment   WBC 9.6  4.0 - 10.5 (K/uL)    RBC 2.60 (*) 3.87 - 5.11 (MIL/uL)  Hemoglobin 7.6 (*) 12.0 - 15.0 (g/dL)    HCT 91.4 (*) 78.2 - 46.0 (%)    MCV 86.2  78.0 - 100.0 (fL)    MCH 29.2  26.0 - 34.0 (pg)    MCHC 33.9  30.0 - 36.0 (g/dL)    RDW 95.6 (*) 21.3 - 15.5 (%)    Platelets 339  150 - 400 (K/uL)   GLUCOSE, CAPILLARY     Status: Abnormal   Collection Time   02/01/11  9:21 PM      Component Value Range Comment   Glucose-Capillary 128 (*) 70 - 99 (mg/dL)   HEMOGLOBIN AND HEMATOCRIT, BLOOD     Status: Abnormal   Collection Time   02/02/11  5:31 AM      Component Value Range Comment   Hemoglobin 7.5 (*) 12.0 -  15.0 (g/dL)    HCT 08.6 (*) 57.8 - 46.0 (%)   BASIC METABOLIC PANEL     Status: Abnormal   Collection Time   02/02/11  5:31 AM      Component Value Range Comment   Sodium 135  135 - 145 (mEq/L)    Potassium 3.8  3.5 - 5.1 (mEq/L)    Chloride 101  96 - 112 (mEq/L)    CO2 23  19 - 32 (mEq/L)    Glucose, Bld 153 (*) 70 - 99 (mg/dL)    BUN 12  6 - 23 (mg/dL)    Creatinine, Ser 4.69  0.50 - 1.10 (mg/dL)    Calcium 8.1 (*) 8.4 - 10.5 (mg/dL)    GFR calc non Af Amer >60  >60 (mL/min)    GFR calc Af Amer >60  >60 (mL/min)   GLUCOSE, CAPILLARY     Status: Abnormal   Collection Time   02/02/11  7:42 AM      Component Value Range Comment   Glucose-Capillary 145 (*) 70 - 99 (mg/dL)     No results found.  Review of Systems  Constitutional: Positive for malaise/fatigue. Negative for fever, chills, weight loss and diaphoresis.  HENT: Negative for neck pain.   Eyes: Negative.   Respiratory: Negative.   Cardiovascular: Negative.   Gastrointestinal: Positive for nausea, abdominal pain (Moderate epigastric abdominal pain), diarrhea and melena. Negative for vomiting.  Genitourinary: Positive for dysuria, urgency and frequency.  Musculoskeletal: Positive for joint pain. Negative for myalgias and back pain.  Skin: Negative.   Neurological: Positive for dizziness, loss of consciousness, weakness and headaches.  Endo/Heme/Allergies: Negative.   Psychiatric/Behavioral: Negative.    Blood pressure 124/75, pulse 92, temperature 98.7 F (37.1 [name]), temperature source Oral, resp. rate 20, height 5' 6.5" (1.689 m), weight 89.6 kg (197 lb 8.5 oz), SpO2 99.00%. Physical Exam  Constitutional: She is oriented to person, place, and time. She appears well-developed.       Obese   HENT:  Head: Normocephalic and atraumatic.  Eyes: Conjunctivae and EOM are normal. Pupils are equal, round, and reactive to light. No scleral icterus.  Neck: Normal range of motion. No tracheal deviation present. No thyromegaly  present.  Cardiovascular: Normal rate, regular rhythm and normal heart sounds.   Respiratory: Effort normal and breath sounds normal. No respiratory distress. She has no wheezes.  GI: Soft. She exhibits no distension and no mass. There is tenderness (Moderate epigastric pain. Mild lower quadrant discomfort on deep palpation. No peritoneal signs.). There is no rebound and no guarding.       Midline incisional scar  Musculoskeletal: Normal range of motion.  Lymphadenopathy:    She has no cervical adenopathy.  Neurological: She is alert and oriented to person, place, and time.  Skin: Skin is warm and dry.    Assessment/Plan: Gastric mass, persistent anemia.  At this point I have noted that the patient has had multiple blood transfusions with only transient effects. She is scheduled for another 2 units of packed RBCs today although clinically she still appears to have ongoing bleeding. I did discuss at length surgical options including surgical antrectomy and removal of the mass. I further discussed with the patient possible etiologies. While current workup has not demonstrated any evidence of malignancy, the possibility of a malignant etiology will still be entertained. Emergent intervention is not currently indicated, however, that she has continued to fail conservative management I do feel that intervention in the next 24-48 hours should be considered. I did discuss with the patient the risks benefits and alternatives of gastric resection as well as likely recovery. I appreciate the opportunity to assist in her care and will continue to follow her current course.  [name] 02/02/2011, 10:28 AM

## 2011-02-02 NOTE — Progress Notes (Signed)
Tolerating pos. Agree w/ surgery 2o to transfusion dependent anemia/Ulcerated gastric mass-?gstric CA, NSAID ulcer, or less likely a gastrinoma. PT HAS H. PYLORI but will need Pylera on discharge. ELEVATED GASTRIN OF UNCERTAIN ETIOLOGY-pt is on a PPI.

## 2011-02-02 NOTE — Telephone Encounter (Signed)
Reminder in epic to repeat tcs in 10-15 years

## 2011-02-02 NOTE — Progress Notes (Signed)
Subjective: [name] is a 77 y.o. female with GI bleed secondary to gastric mass and ICV ulcer.  Bx inconclusive.  Pt is transfusion dependent.  Last transfusion 01/31/2011.  S/p 4 units PRBCs total.  Pt notes mild upper abd pain.  Some dark BMs.  Denies N/V.  Gastrin level: 330 High Gastric pathology: INCONCLUSIVE  Objective: Vital signs in last 24 hours: Temp:  [98 F (36.7 C)-98.7 F (37.1 C)] 98.7 F (37.1 C) (08/06 0600) Pulse Rate:  [92-106] 92  (08/06 0600) Resp:  [20-24] 20  (08/06 0600) BP: (124-148)/(63-95) 124/75 mmHg (08/06 0600) SpO2:  [95 %-100 %] 99 % (08/06 0600) Weight:  [197 lb 8.5 oz (89.6 kg)] 197 lb 8.5 oz (89.6 kg) (08/06 0600) Last BM Date: 02/01/11 General:   Alert,  Well-developed, well-nourished, pleasant and cooperative in NAD Head:  Normocephalic and atraumatic. Eyes:  Sclera clear, no icterus.   Conjunctiva pink. Mouth:  No deformity or lesions, dentition normal. Neck:  Supple; no masses or thyromegaly. Heart:  Regular rate and rhythm; no murmurs, clicks, rubs,  or gallops. Abdomen:  Soft, nontender and nondistended. No masses, hepatosplenomegaly or hernias noted. Normal bowel sounds, without guarding, and without rebound.   Msk:  Symmetrical without gross deformities. Normal posture. Pulses:  Normal pulses noted. Extremities:  Without clubbing or edema. Neurologic:  Alert and  oriented x4;  grossly normal neurologically. Skin:  Intact without significant lesions or rashes. Cervical Nodes:  No significant cervical adenopathy. Psych:  Alert and cooperative. Normal mood and affect.  Intake/Output from previous day: 08/05 0701 - 08/06 0700 In: 1040 [P.O.:1040] Out: 1400 [Urine:1400] Lab Results:  Basename 02/02/11 0531 02/01/11 1805 02/01/11 1235 02/01/11 0645  WBC -- 9.6 9.9 8.4  HGB 7.5* 7.6* 8.3* --  HCT 22.5* 22.4* 24.3* --  PLT -- 339 359 307   BMET  Basename 02/02/11 0531 01/31/11 0425  NA 135 132*  K 3.8 4.4  CL 101 96  CO2 23 25    GLUCOSE 153* 166*  BUN 12 23  CREATININE 0.55 0.80  CALCIUM 8.1* 8.1*   Assessment:  1. Anemia Due to GI bleed secondary to large gastric mass. Biopsy inconclusive. Likely malignancy in combination with NSAIDS. H/H 7.5. Gastrin level elevated. 2. Ileocecal valve ulcer   Plan: Transfuse 2 more units PRBCs Continue PPI BID Surgical consult Dr Lovell Sheehan for transfusion-dependent gastric mass, suspect malignancy (RN aware of consult) (Discussed w/ Dr Darrick Penna)   LOS: 7 days   Lorenza Burton  02/02/2011, 8:55 AM

## 2011-02-02 NOTE — Telephone Encounter (Signed)
Results Cc to PCP  

## 2011-02-02 NOTE — Progress Notes (Signed)
Notes reviewed. Plans noted.  Subjective: C/o orthopnea.  Poor sleep. Melena last pm.  Objective: Vital signs in last 24 hours: Filed Vitals:   02/02/11 1115 02/02/11 1134 02/02/11 1234 02/02/11 1334  BP: 138/60 136/68 136/73 132/72  Pulse: 93 89 93 88  Temp: 99.5 F (37.5 C) 99.4 F (37.4 C) 98.7 F (37.1 C) 98.9 F (37.2 C)  TempSrc: Oral Oral Oral Oral  Resp: 18 18 18 18   Height:      Weight:      SpO2:       Weight change:   Intake/Output Summary (Last 24 hours) at 02/02/11 1340 Last data filed at 02/02/11 1119  Gross per 24 hour  Intake    890 ml  Output    800 ml  Net     90 ml   General appearance: alert and no distress Lungs: clear to auscultation bilaterally Heart: regular rate and rhythm, S1, S2 normal, no murmur, click, rub or gallop Abdomen: soft, non-tender; bowel sounds normal; no masses,  no organomegaly Extremities: edema 1+ Lab Results: Basic Metabolic Panel:  Basename 02/02/11 0531 01/31/11 0425  NA 135 132*  K 3.8 4.4  CL 101 96  CO2 23 25  GLUCOSE 153* 166*  BUN 12 23  CREATININE 0.55 0.80  CALCIUM 8.1* 8.1*  MG -- --  PHOS -- --    Basename 02/02/11 0531 02/01/11 1805 02/01/11 1235  WBC -- 9.6 9.9  NEUTROABS -- -- --  HGB 7.5* 7.6* --  HCT 22.5* 22.4* --  MCV -- 86.2 86.2  PLT -- 339 359    Basename 02/02/11 1140 02/02/11 0742 02/01/11 2121 02/01/11 1654 02/01/11 1127 02/01/11 0749  GLUCAP 169* 145* 128* 148* 169* 137*   Scheduled Meds:    . amLODipine  5 mg Oral Daily  . benazepril  20 mg Oral Daily  . insulin aspart  0-9 Units Subcutaneous TID WC  . insulin glargine  15 Units Subcutaneous Daily  . pantoprazole  40 mg Oral BID AC  . polyethylene glycol  17 g Oral Daily  . DISCONTD: pantoprazole  40 mg Oral BID AC   Continuous Infusions:    . DISCONTD: sodium chloride 1,000 mL (01/31/11 2135)   PRN Meds:.acetaminophen, HYDROmorphone, ondansetron (ZOFRAN) IV, ondansetron, oxyCODONE, senna-docusate, sodium phosphate,  traZODone Assessment/Plan: Patient Active Hospital Problem List: Gastric mass (01/29/2011): Continues to require transfusion.  Pt agreeable to surgery Acute blood loss Anemia (01/26/2011):  Getting transfusion.  Will also give lasix DIABETES MELLITUS, TYPE II (09/13/2007): stable  HYPERTENSION (09/13/2007):  Stable Syncope and collapse (01/26/2011) Hepatic steatosis (01/28/2011) Hyponatremia (01/28/2011): resolved Insomnia:  trazadone prn H pylori ulcer (01/30/2011) Ulcer, colon (01/30/2011) Appreciate consultants   LOS: 7 days   [name] L 02/02/2011, 1:40 PM

## 2011-02-03 LAB — TYPE AND SCREEN
Unit division: 0
Unit division: 0

## 2011-02-03 LAB — GLUCOSE, CAPILLARY
Glucose-Capillary: 154 mg/dL — ABNORMAL HIGH (ref 70–99)
Glucose-Capillary: 151 mg/dL — ABNORMAL HIGH (ref 70–99)
Glucose-Capillary: 149 mg/dL — ABNORMAL HIGH (ref 70–99)

## 2011-02-03 LAB — HEMOGLOBIN AND HEMATOCRIT, BLOOD: HCT: 30.3 % — ABNORMAL LOW (ref 36.0–46.0)

## 2011-02-03 MED ORDER — SODIUM CHLORIDE 0.9 % IJ SOLN
INTRAMUSCULAR | Status: AC
  Filled 2011-02-03: qty 10

## 2011-02-03 NOTE — Progress Notes (Signed)
6 Days Post-Op  Subjective: No pain.  No significant change.  Still with dark melenic stools.  Loose BM.  No light-headedness  Objective: Vital signs in last 24 hours: Temp:  [98.7 F (37.1 C)-99.5 F (37.5 C)] 99 F (37.2 C) (08/07 0519) Pulse Rate:  [76-90] 79  (08/07 0519) Resp:  [18] 18  (08/07 0519) BP: (123-144)/(70-77) 129/76 mmHg (08/07 0519) SpO2:  [94 %-95 %] 95 % (08/07 0519) Weight:  [97.8 kg (215 lb 9.8 oz)] 215 lb 9.8 oz (97.8 kg) (08/07 0700) Last BM Date: 02/02/11  Intake/Output from previous day: 08/06 0701 - 08/07 0700 In: 1537.5 [P.O.:360; I.V.:500; Blood:677.5] Out: 1050 [Urine:1050] Intake/Output this shift: I/O this shift: In: 240 [P.O.:240] Out: 200 [Urine:200]  General appearance: alert Eyes: conjunctivae/corneas clear. PERRL, EOM's intact. Fundi benign. GI: soft, non-tender; bowel sounds normal; no masses,  no organomegaly and no acute findings  Lab Results:  @LABLAST2 (wbc:2,hgb:2,hct:2,plt:2) BMET  Basename 02/02/11 0531  NA 135  K 3.8  CL 101  CO2 23  GLUCOSE 153*  BUN 12  CREATININE 0.55  CALCIUM 8.1*   PT/INR No results found for this basename: LABPROT:2,INR:2 in the last 72 hours ABG No results found for this basename: PHART:2,PCO2:2,PO2:2,HCO3:2 in the last 72 hours  Studies/Results: No results found.  Anti-infectives: Anti-infectives    None      Assessment/Plan: s/p Procedure(s): ESOPHAGOGASTRODUODENOSCOPY (EGD) COLONOSCOPY Gastric Mass.  Will plan to proceed to OR tomorrow.  Again discussed with patient the Risks, benefits, and alternatives for the OR.  Regarding Urinary issues, advised that this be delayed until after recovery from surgery.    LOS: 8 days    Jayce Kainz C 02/03/2011

## 2011-02-03 NOTE — Progress Notes (Signed)
Appropriate transfusion response. Pt resting. Surgery pending. Will sign off. CALL WITH QUESTIONS.

## 2011-02-03 NOTE — Progress Notes (Signed)
Subjective: [name] is a 77 y.o. female with GI bleed secondary to gastric mass and ICV ulcer.  Denies any abdominal pain.  Denies N/V.  No BM this AM.  Hgb improved s/p 2 units PRBCs yesterday.   Objective: Vital signs in last 24 hours: Temp:  [98 F (36.7 C)-98.7 F (37.1 C)] 98.7 F (37.1 C) (08/06 0600) Pulse Rate:  [92-106] 92  (08/06 0600) Resp:  [20-24] 20  (08/06 0600) BP: (124-148)/(63-95) 124/75 mmHg (08/06 0600) SpO2:  [95 %-100 %] 99 % (08/06 0600) Weight:  [197 lb 8.5 oz (89.6 kg)] 197 lb 8.5 oz (89.6 kg) (08/06 0600) Last BM Date: 02/01/11 General:   Alert,  Well-developed, well-nourished, pleasant and cooperative in NAD Head:  Normocephalic and atraumatic. Eyes:  Sclera clear, no icterus.   Conjunctiva pink. Mouth:  OP pink, moist. Neck:  Supple; no masses or thyromegaly. Heart:  Regular rate and rhythm; no murmurs, clicks, rubs,  or gallops. Abdomen:  Soft, nontender and nondistended. No masses, hepatosplenomegaly or hernias noted. Normal bowel sounds, without guarding, and without rebound.   Msk:  Symmetrical without gross deformities. Normal posture. Pulses:  Normal pulses noted. Extremities:  Trace lower extremity edema. Neurologic:  Alert and  oriented x4;  grossly normal neurologically. Skin:  Intact without significant lesions or rashes. Cervical Nodes:  No significant cervical adenopathy. Psych:  Alert and cooperative. Normal mood and affect.  Intake/Output from previous day: 08/05 0701 - 08/06 0700 In: 1040 [P.O.:1040] Out: 1400 [Urine:1400]  Lab Results: Hgb 10.3 @ 0523  Assessment:  1. Anemia Due to GI bleed secondary to large gastric mass. Biopsy inconclusive. Likely malignancy in combination with NSAIDS. H/H with appropriate transfusion response s/p 6 units PRBCs total. Gastrin level elevated.  Surgery is following. 2. Ileocecal valve ulcer 3. H pylori positive   Plan: Appreciate Dr. Aris Lot assistance Continue PPI BID Outpatient  Pylera tx for h pylori (PCN allergy) Continue to monitor for bleeding/melena   LOS: 8 days   Lorenza Burton  02/03/2011, 8:10 AM

## 2011-02-03 NOTE — Progress Notes (Signed)
Notes reviewed. Plans noted.  Subjective: Dyspnea, orthopnea resolved. No new  Objective: Vital signs in last 24 hours: Filed Vitals:   02/02/11 2211 02/03/11 0519 02/03/11 0700 02/03/11 1400  BP: 123/70 129/76  122/74  Pulse: 76 79  74  Temp: 98.7 F (37.1 C) 99 F (37.2 C)  98.4 F (36.9 C)  TempSrc: Oral Oral  Oral  Resp: 18 18  18   Height:      Weight:   97.8 kg (215 lb 9.8 oz)   SpO2: 94% 95%  95%   Weight change: 8.2 kg (18 lb 1.3 oz)  Intake/Output Summary (Last 24 hours) at 02/03/11 1846 Last data filed at 02/03/11 1700  Gross per 24 hour  Intake    720 ml  Output   1350 ml  Net   -630 ml   General appearance: asleep. arousable Lungs: clear to auscultation bilaterally Heart: regular rate and rhythm, S1, S2 normal, no murmur, click, rub or gallop Abdomen: soft, non-tender; bowel sounds normal; no masses,  no organomegaly Extremities: edema 1+ Lab Results: Basic Metabolic Panel:  Basename 02/02/11 0531  NA 135  K 3.8  CL 101  CO2 23  GLUCOSE 153*  BUN 12  CREATININE 0.55  CALCIUM 8.1*  MG --  PHOS --    Basename 02/03/11 0523 02/02/11 1810 02/01/11 1805 02/01/11 1235  WBC -- -- 9.6 9.9  NEUTROABS -- -- -- --  HGB 10.3* 10.1* -- --  HCT 30.3* 29.6* -- --  MCV -- -- 86.2 86.2  PLT -- -- 339 359    Basename 02/03/11 1704 02/03/11 1206 02/03/11 0730 02/02/11 2054 02/02/11 1639 02/02/11 1140  GLUCAP 151* 119* 154* 133* 120* 169*   Scheduled Meds:    . amLODipine  5 mg Oral Daily  . benazepril  20 mg Oral Daily  . furosemide  40 mg Oral Once  . insulin aspart  0-9 Units Subcutaneous TID WC  . insulin glargine  15 Units Subcutaneous Daily  . pantoprazole  40 mg Oral BID AC  . polyethylene glycol  17 g Oral Daily  . sodium chloride      . traZODone  50 mg Oral QHS   Continuous Infusions:   PRN Meds:.acetaminophen, HYDROmorphone, ondansetron (ZOFRAN) IV, ondansetron, oxyCODONE, senna-docusate, sodium phosphate Assessment/Plan: Patient  Active Hospital Problem List: Gastric mass (01/29/2011): Surgery tomorrow Acute blood loss Anemia (01/26/2011):  better DIABETES MELLITUS, TYPE II (09/13/2007): stable  HYPERTENSION (09/13/2007):  Stable Syncope and collapse (01/26/2011) Hepatic steatosis (01/28/2011) Hyponatremia (01/28/2011): resolved Insomnia:  trazadone prn H pylori ulcer (01/30/2011)  Appreciate consultants   LOS: 8 days   [name] L 02/03/2011, 6:46 PM

## 2011-02-04 ENCOUNTER — Other Ambulatory Visit: Payer: Self-pay | Admitting: General Surgery

## 2011-02-04 ENCOUNTER — Encounter (HOSPITAL_COMMUNITY): Payer: Self-pay | Admitting: Anesthesiology

## 2011-02-04 ENCOUNTER — Inpatient Hospital Stay (HOSPITAL_COMMUNITY): Payer: Medicare HMO | Admitting: Anesthesiology

## 2011-02-04 ENCOUNTER — Encounter (HOSPITAL_COMMUNITY)
Admission: EM | Disposition: A | Discharge status: Skilled Nursing Facility | Payer: Self-pay | Source: Home / Self Care | Attending: Internal Medicine

## 2011-02-04 HISTORY — PX: LAPAROTOMY: SHX154

## 2011-02-04 LAB — PREPARE RBC (CROSSMATCH)

## 2011-02-04 LAB — GLUCOSE, CAPILLARY
Glucose-Capillary: 173 mg/dL — ABNORMAL HIGH (ref 70–99)
Glucose-Capillary: 179 mg/dL — ABNORMAL HIGH (ref 70–99)
Glucose-Capillary: 185 mg/dL — ABNORMAL HIGH (ref 70–99)

## 2011-02-04 LAB — CEA: CEA: <0.5 ng/mL (ref 0.0–5.0)

## 2011-02-04 SURGERY — LAPAROTOMY, EXPLORATORY
Anesthesia: General | Site: Abdomen | Wound class: Clean

## 2011-02-04 MED ORDER — LIDOCAINE HCL (PF) 1 % IJ SOLN
INTRAMUSCULAR | Status: AC
  Filled 2011-02-04: qty 5

## 2011-02-04 MED ORDER — ROCURONIUM BROMIDE 50 MG/5ML IV SOLN
INTRAVENOUS | Status: AC
  Filled 2011-02-04: qty 1

## 2011-02-04 MED ORDER — ONDANSETRON HCL 4 MG/2ML IJ SOLN: 4.0000 mg | Freq: Once | INTRAMUSCULAR | Status: AC | PRN

## 2011-02-04 MED ORDER — PROPOFOL 10 MG/ML IV EMUL
INTRAVENOUS | Status: AC
  Filled 2011-02-04: qty 20

## 2011-02-04 MED ORDER — HETASTARCH-ELECTROLYTES 6 % IV SOLN
INTRAVENOUS | Status: AC
  Filled 2011-02-04: qty 500

## 2011-02-04 MED ORDER — HYDROMORPHONE HCL 1 MG/ML IJ SOLN
0.2500 mg | INTRAMUSCULAR | Status: DC | PRN
  Administered 2011-02-04 (×3): 0.5 mg via INTRAVENOUS
  Filled 2011-02-04: qty 1

## 2011-02-04 MED ORDER — LACTATED RINGERS IV SOLN
INTRAVENOUS | Status: DC
  Administered 2011-02-04: 08:00:00 via INTRAVENOUS

## 2011-02-04 MED ORDER — MIDAZOLAM HCL 2 MG/2ML IJ SOLN
1.0000 mg | INTRAMUSCULAR | Status: DC | PRN
  Administered 2011-02-04: 2 mg via INTRAVENOUS

## 2011-02-04 MED ORDER — LACTATED RINGERS IV SOLN: INTRAVENOUS | Status: DC

## 2011-02-04 MED ORDER — SUFENTANIL CITRATE 50 MCG/ML IV SOLN
INTRAVENOUS | Status: DC | PRN
  Administered 2011-02-04: 30 ug via INTRAVENOUS
  Administered 2011-02-04 (×2): 10 ug via INTRAVENOUS

## 2011-02-04 MED ORDER — ONDANSETRON HCL 4 MG/2ML IJ SOLN
INTRAMUSCULAR | Status: AC
  Filled 2011-02-04: qty 2

## 2011-02-04 MED ORDER — LACTATED RINGERS IV SOLN
INTRAVENOUS | Status: DC | PRN
  Administered 2011-02-04: 08:00:00 via INTRAVENOUS

## 2011-02-04 MED ORDER — FENTANYL CITRATE 0.05 MG/ML IJ SOLN
INTRAMUSCULAR | Status: AC
  Administered 2011-02-04: 25 ug via INTRAVENOUS
  Filled 2011-02-04: qty 2

## 2011-02-04 MED ORDER — NEOSTIGMINE METHYLSULFATE 1 MG/ML IJ SOLN
INTRAMUSCULAR | Status: DC | PRN
  Administered 2011-02-04: 4 mg via INTRAMUSCULAR

## 2011-02-04 MED ORDER — ONDANSETRON HCL 4 MG/2ML IJ SOLN
INTRAMUSCULAR | Status: DC | PRN
  Administered 2011-02-04: 4 mg via INTRAVENOUS

## 2011-02-04 MED ORDER — ROCURONIUM BROMIDE 100 MG/10ML IV SOLN
INTRAVENOUS | Status: DC | PRN
  Administered 2011-02-04: 40 mg via INTRAVENOUS
  Administered 2011-02-04 (×2): 10 mg via INTRAVENOUS

## 2011-02-04 MED ORDER — ENOXAPARIN SODIUM 40 MG/0.4ML ~~LOC~~ SOLN
40.0000 mg | Freq: Once | SUBCUTANEOUS | Status: AC
  Administered 2011-02-04: 40 mg via SUBCUTANEOUS

## 2011-02-04 MED ORDER — ENALAPRILAT 1.25 MG/ML IV SOLN
0.6250 mg | Freq: Four times a day (QID) | INTRAVENOUS | Status: DC
  Administered 2011-02-04 – 2011-02-05 (×4): 0.625 mg via INTRAVENOUS
  Filled 2011-02-04 (×4): qty 2

## 2011-02-04 MED ORDER — NEOSTIGMINE METHYLSULFATE 1 MG/ML IJ SOLN
INTRAMUSCULAR | Status: AC
  Filled 2011-02-04: qty 10

## 2011-02-04 MED ORDER — FENTANYL CITRATE 0.05 MG/ML IJ SOLN
INTRAMUSCULAR | Status: AC
  Administered 2011-02-04: 50 ug via INTRAVENOUS
  Filled 2011-02-04: qty 2

## 2011-02-04 MED ORDER — GLYCOPYRROLATE 0.2 MG/ML IJ SOLN
INTRAMUSCULAR | Status: AC
  Filled 2011-02-04: qty 2

## 2011-02-04 MED ORDER — METRONIDAZOLE IN NACL 5-0.79 MG/ML-% IV SOLN
INTRAVENOUS | Status: AC
  Filled 2011-02-04: qty 100

## 2011-02-04 MED ORDER — LIDOCAINE HCL 1 % IJ SOLN
INTRAMUSCULAR | Status: DC | PRN
  Administered 2011-02-04: 50 mg via INTRADERMAL

## 2011-02-04 MED ORDER — HYDROMORPHONE HCL 1 MG/ML IJ SOLN
1.0000 mg | INTRAMUSCULAR | Status: DC | PRN
  Administered 2011-02-04 – 2011-02-05 (×3): 2 mg via INTRAVENOUS
  Administered 2011-02-05: 1 mg via INTRAVENOUS
  Administered 2011-02-05 – 2011-02-07 (×7): 2 mg via INTRAVENOUS
  Administered 2011-02-07 (×2): 1 mg via INTRAVENOUS
  Administered 2011-02-08 (×2): 2 mg via INTRAVENOUS
  Filled 2011-02-04: qty 1
  Filled 2011-02-04 (×10): qty 2
  Filled 2011-02-04: qty 1
  Filled 2011-02-04 (×2): qty 2

## 2011-02-04 MED ORDER — PROPOFOL 10 MG/ML IV EMUL
INTRAVENOUS | Status: DC | PRN
  Administered 2011-02-04: 80 mg via INTRAVENOUS

## 2011-02-04 MED ORDER — MIDAZOLAM HCL 2 MG/2ML IJ SOLN
INTRAMUSCULAR | Status: AC
  Administered 2011-02-04: 2 mg via INTRAVENOUS
  Filled 2011-02-04: qty 2

## 2011-02-04 MED ORDER — METRONIDAZOLE IN NACL 5-0.79 MG/ML-% IV SOLN
500.0000 mg | INTRAVENOUS | Status: DC
  Filled 2011-02-04: qty 100

## 2011-02-04 MED ORDER — HETASTARCH-ELECTROLYTES 6 % IV SOLN
INTRAVENOUS | Status: DC | PRN
  Administered 2011-02-04: 09:00:00 via INTRAVENOUS

## 2011-02-04 MED ORDER — SODIUM CHLORIDE 0.9 % IR SOLN
Status: DC | PRN
  Administered 2011-02-04: 2000 mL

## 2011-02-04 MED ORDER — GLYCOPYRROLATE 0.2 MG/ML IJ SOLN
INTRAMUSCULAR | Status: DC | PRN
  Administered 2011-02-04: .6 mg via INTRAVENOUS

## 2011-02-04 MED ORDER — METRONIDAZOLE IN NACL 5-0.79 MG/ML-% IV SOLN
INTRAVENOUS | Status: DC | PRN
  Administered 2011-02-04: 500 mg via INTRAVENOUS

## 2011-02-04 MED ORDER — ENOXAPARIN SODIUM 40 MG/0.4ML ~~LOC~~ SOLN
SUBCUTANEOUS | Status: AC
  Administered 2011-02-04: 40 mg via SUBCUTANEOUS
  Filled 2011-02-04: qty 0.4

## 2011-02-04 MED ORDER — SUFENTANIL CITRATE 50 MCG/ML IV SOLN
INTRAVENOUS | Status: AC
  Filled 2011-02-04: qty 1

## 2011-02-04 MED ORDER — HYDROCODONE-ACETAMINOPHEN 5-325 MG PO TABS
1.0000 | ORAL_TABLET | ORAL | Status: DC | PRN
  Administered 2011-02-04 – 2011-02-07 (×9): 2 via ORAL
  Administered 2011-02-07: 1 via ORAL
  Administered 2011-02-08 – 2011-02-09 (×4): 2 via ORAL
  Filled 2011-02-04 (×15): qty 2

## 2011-02-04 MED ORDER — FENTANYL CITRATE 0.05 MG/ML IJ SOLN
INTRAMUSCULAR | Status: DC | PRN
  Administered 2011-02-04 (×4): 50 ug via INTRAVENOUS

## 2011-02-04 MED ORDER — HYDROMORPHONE HCL 2 MG/ML IJ SOLN
INTRAMUSCULAR | Status: AC
  Administered 2011-02-04: 0.5 mg
  Filled 2011-02-04: qty 1

## 2011-02-04 MED ORDER — FENTANYL CITRATE 0.05 MG/ML IJ SOLN
25.0000 ug | INTRAMUSCULAR | Status: DC | PRN
  Administered 2011-02-04 (×2): 50 ug via INTRAVENOUS
  Administered 2011-02-04 (×2): 25 ug via INTRAVENOUS
  Administered 2011-02-04: 50 ug via INTRAVENOUS

## 2011-02-04 SURGICAL SUPPLY — 64 items
APPLIER CLIP 11 MED OPEN (CLIP) ×3
APR CLP MED 11 20 MLT OPN (CLIP) ×2
BAG HAMPER (MISCELLANEOUS) ×3 IMPLANT
BRR ADH 6X5 SEPRAFILM 1 SHT (MISCELLANEOUS) ×2
CLIP APPLIE 11 MED OPEN (CLIP) ×1 IMPLANT
CLOTH BEACON ORANGE TIMEOUT ST (SAFETY) ×3 IMPLANT
COVER LIGHT HANDLE STERIS (MISCELLANEOUS) ×6 IMPLANT
CUTTER ENDO LINEAR 45M (STAPLE) ×2 IMPLANT
CUTTER FLEX LINEAR 45M (STAPLE) ×2 IMPLANT
DRAIN PENROSE 18X1/2 LTX STRL (DRAIN) ×3 IMPLANT
DRAPE WARM FLUID 44X44 (DRAPE) ×3 IMPLANT
DURAPREP 26ML APPLICATOR (WOUND CARE) ×3 IMPLANT
ELECT BLADE 6 FLAT ULTRCLN (ELECTRODE) ×2 IMPLANT
ELECT REM PT RETURN 9FT ADLT (ELECTROSURGICAL) ×3
ELECTRODE REM PT RTRN 9FT ADLT (ELECTROSURGICAL) ×2 IMPLANT
EVACUATOR DRAINAGE 10X20 100CC (DRAIN) ×1 IMPLANT
EVACUATOR SILICONE 100CC (DRAIN)
GAUZE SPONGE 4X4 12PLY STRL LF (GAUZE/BANDAGES/DRESSINGS) ×2 IMPLANT
GLOVE BIO SURGEON STRL SZ7.5 (GLOVE) ×2 IMPLANT
GLOVE BIOGEL PI IND STRL 7.0 (GLOVE) ×3 IMPLANT
GLOVE BIOGEL PI IND STRL 7.5 (GLOVE) ×2 IMPLANT
GLOVE BIOGEL PI INDICATOR 7.0 (GLOVE) ×3
GLOVE BIOGEL PI INDICATOR 7.5 (GLOVE) ×1
GLOVE ECLIPSE 6.5 STRL STRAW (GLOVE) ×2 IMPLANT
GLOVE ECLIPSE 7.0 STRL STRAW (GLOVE) ×5 IMPLANT
GLOVE EXAM NITRILE MD LF STRL (GLOVE) ×2 IMPLANT
GOWN BRE IMP SLV AUR XL STRL (GOWN DISPOSABLE) ×13 IMPLANT
HARMONIC SHEARS 14CM COAG (MISCELLANEOUS) IMPLANT
INST SET MAJOR GENERAL (KITS) ×3 IMPLANT
KIT ROOM TURNOVER APOR (KITS) ×3 IMPLANT
LIGASURE IMPACT 36 18CM CVD LR (INSTRUMENTS) ×1 IMPLANT
MANIFOLD NEPTUNE II (INSTRUMENTS) ×3 IMPLANT
NDL HYPO 21X1.5 SAFETY (NEEDLE) IMPLANT
NEEDLE HYPO 21X1.5 SAFETY (NEEDLE) ×3 IMPLANT
PACK ABDOMINAL MAJOR (CUSTOM PROCEDURE TRAY) ×3 IMPLANT
PAD ABD 5X9 TENDERSORB (GAUZE/BANDAGES/DRESSINGS) ×4 IMPLANT
PAD ARMBOARD 7.5X6 YLW CONV (MISCELLANEOUS) ×3 IMPLANT
RELOAD LINEAR CUT PROX 55 BLUE (ENDOMECHANICALS) IMPLANT
RELOAD STAPLE 45 3.5 BLU ETS (ENDOMECHANICALS) IMPLANT
RELOAD STAPLE 55 3.8 BLU REG (ENDOMECHANICALS) IMPLANT
RELOAD STAPLE TA45 3.5 REG BLU (ENDOMECHANICALS) ×3 IMPLANT
RETRACTOR WND ALEXIS 25 LRG (MISCELLANEOUS) ×1 IMPLANT
RTRCTR WOUND ALEXIS 25CM LRG (MISCELLANEOUS) ×3
SEALER TISSUE G2 CVD JAW 35 (ENDOMECHANICALS) ×1 IMPLANT
SEALER TISSUE G2 CVD JAW 45CM (ENDOMECHANICALS) ×1
SEPRAFILM MEMBRANE 5X6 (MISCELLANEOUS) ×2 IMPLANT
SET BASIN LINEN APH (SET/KITS/TRAYS/PACK) ×3 IMPLANT
SPONGE DRAIN TRACH 4X4 STRL 2S (GAUZE/BANDAGES/DRESSINGS) ×3 IMPLANT
SPONGE INTESTINAL PEANUT (DISPOSABLE) IMPLANT
STAPLER PROXIMATE 55 BLUE (STAPLE) IMPLANT
STAPLER PROXIMATE 90 DIAL (STAPLE) IMPLANT
STAPLER VISISTAT (STAPLE) ×2 IMPLANT
SUCTION POOLE TIP (SUCTIONS) ×3 IMPLANT
SUT ETHILON 3 0 FSL (SUTURE) ×1 IMPLANT
SUT NOVA NAB GS-26 0 60 (SUTURE) ×7 IMPLANT
SUT PROLENE 2 0 SH 30 (SUTURE) ×1 IMPLANT
SUT SILK 2 0 (SUTURE)
SUT SILK 2-0 18XBRD TIE 12 (SUTURE) ×1 IMPLANT
SUT SILK 3 0 SH CR/8 (SUTURE) ×3 IMPLANT
SYR BULB IRRIGATION 50ML (SYRINGE) ×3 IMPLANT
TAPE CLOTH SURG 4X10 WHT LF (GAUZE/BANDAGES/DRESSINGS) ×2 IMPLANT
TOWEL BLUE STERILE X RAY DET (MISCELLANEOUS) ×3 IMPLANT
TOWEL OR 17X26 4PK STRL BLUE (TOWEL DISPOSABLE) ×1 IMPLANT
TRAY FOLEY CATH 14FR (SET/KITS/TRAYS/PACK) ×3 IMPLANT

## 2011-02-04 NOTE — Transfer of Care (Signed)
Immediate Anesthesia Transfer of Care Note  Patient: [name]  Procedure(s) Performed:  EXPLORATORY LAPAROTOMY  Patient Location: PACU  Anesthesia Type: General  Level of Consciousness: awake, alert  and patient cooperative  Airway & Oxygen Therapy: Patient connected to face mask oxygen  Post-op Assessment: Report given to PACU RN and Post -op Vital signs reviewed and stable  Post vital signs: stable  Complications: No apparent anesthesia complications

## 2011-02-04 NOTE — Op Note (Signed)
Patient:  [name]  DOB:  [birth year]  MRN:  [id number]   Preop Diagnosis:  Gastric antral mass  Postop Diagnosis:  Pancreatic head mass involving first and second portion of duodenum  Procedure:  Exploratory laparotomy, FNA of pancreatic head mass  Surgeon:  Dr. Tilford Pillar  Anes:  Gen. endotracheal  Indications:  Patient is a 77 year old female who presented to [facility] with symptoms related to anemia. Workup had been conducted which demonstrated an ulcerated mass which was reported as the antrum of the stomach. Biopsies demonstrated inflammatory changes but no evidence of malignancy. As slow ongoing bleeding continue to be an issue surgical consultation was obtained. Risks benefits and alternatives antrectomy were discussed at length the patient. Her questions and concerns were addressed. Patient was consented for planned procedure.  Procedure note:  Patient was taken to the operating room and placed in a supine position. Gen. anesthesia was administered and once the patient was asleep she was endotracheally intubated by anesthesiology. At this point a Foley catheter was placed in standard sterile fashion. Her abdomen was prepped with DuraPrep solution and draped in standard fashion. An upper midline incision was created with a 10 blade scalpel. Additional dissection down through subcutaneous tissue was carried out using electrocautery including the division of the anterior fascia. At this point blunt penetration into the peritoneal cavity was achieved. The peritoneum was opened both superiorly and inferiorly. At this point a large protractor retractor was inserted. And the stomach was identified. Patient of the distal stomach and duodenum demonstrated a thickened area. At this point I began the mobilization of the distal stomach. The gastrocolic ligament was divided distally. At approximately mid stomach a window was created adjacent to the lesser curvature of the stomach. A  quarter-inch Penrose drain was placed for assistance with mobilization. I then carefully began my dissection along the lesser curvature distally. Hemostasis was obtained with a combination of the EnSeal bipolar device, electrocautery, and surgical clips. A large gastric vein was ligated with a 3-0 silk in figure-of-eight fashion. As I continued the dissection became more suspicious that the palpable posterior mass is actually involving the pancreatic head. I opted at this point to kocherize the duodenum. And with this maneuver the pancreatic head was noted to clearly be involved. I did request intraoperative consultation with my partner Dr. Lovell Sheehan at this point. Upon scrubbing in he confirmed my suspicion that the pancreas was involved. At this point we opted to proceed with FNA of the mass. A 21-gauge needle was utilized and was directed through the lateral aspect of the duodenum into the palpable mass. 2 separate passes with 2 separate needles and needles were utilized. The aspirated fluid was fixed to pathology slides in the operating room. These were sent to pathology for formal cytology and evaluation.  At this point hemostasis was excellent and I turned my attention to closure. Confirming all lap counts are correct, I placed a piece of Seprafilm into the upper abdomen over the stomach and omentum. The fascia was reapproximated with an 0 looped Novafil suture x2. The initial suture was started superiorly was brought to the midportion of the incision. The second suture was started inferiorly it was secured to the first at the midportion of the incision. The skin edges were reapproximated using regular skin staples. The skin was washed and dried a moistened dry towel. Gauze 4 x 4 dressings and an ABG was placed over the incision. The drapes were removed. The dressing was secured with  Medipore tape. The patient was allowed to come out of general anesthetic. Was transferred back to regular hospital bed. She was  transferred in stable condition to the PACU. At the conclusion of procedure all instrument sponge and needle counts are correct. The patient tolerated the procedure well.  Complications:  None  EBL:  100 mL  Specimen:  Pancreatic mass FNA samples  Intraoperative consultation: Dr. Franky Macho

## 2011-02-04 NOTE — Anesthesia Postprocedure Evaluation (Signed)
  Anesthesia Post-op Note  Patient: [name]  Procedure(s) Performed:  EXPLORATORY LAPAROTOMY  Anesthesia Type: General  Level of Consciousness: awake and alert   Airway and Oxygen Therapy: Patient Spontanous Breathing  Post-op Pain: none  Post-op Assessment: Patient's Cardiovascular Status Stable, Respiratory Function Stable, Patent Airway and No signs of Nausea or vomiting  Post-op Vital Signs: stable  Complications: No apparent anesthesia complications

## 2011-02-04 NOTE — Progress Notes (Signed)
  Patient seen and evaluated in the preop holding area. No change from prior evaluations. Rediscussed the procedure, indications, and recovery with the patient. Risks benefits alternatives again discussed. Will plan to proceed to the operating room as consented for a partial gastrectomy.

## 2011-02-04 NOTE — Progress Notes (Addendum)
Notes reviewed.  Subjective: Dyspnea, orthopnea resolved. No new  Objective: Vital signs in last 24 hours: Filed Vitals:   02/04/11 1238 02/04/11 1306 02/04/11 1345 02/04/11 1800  BP: 173/91 153/87 134/90 151/98  Pulse: 110  118 123  Temp: 97.6 F (36.4 C)  98.6 F (37 C) 99.2 F (37.3 C)  TempSrc:   Oral Oral  Resp: 22  20 20   Height:      Weight:      SpO2:   95% 93%   Weight change: -18 kg (-39 lb 10.9 oz)  Intake/Output Summary (Last 24 hours) at 02/04/11 2027 Last data filed at 02/04/11 1800  Gross per 24 hour  Intake   2140 ml  Output   2850 ml  Net   -710 ml   General appearance: asleep. arousable Lungs: clear to auscultation bilaterally Heart: regular rate and rhythm, S1, S2 normal, no murmur, click, rub or gallop Abdomen: soft, non-tender; bowel sounds normal; no masses,  no organomegaly Extremities: edema 1+ Lab Results: Basic Metabolic Panel:  Basename 02/02/11 0531  NA 135  K 3.8  CL 101  CO2 23  GLUCOSE 153*  BUN 12  CREATININE 0.55  CALCIUM 8.1*  MG --  PHOS --    Basename 02/03/11 0523 02/02/11 1810  WBC -- --  NEUTROABS -- --  HGB 10.3* 10.1*  HCT 30.3* 29.6*  MCV -- --  PLT -- --    Basename 02/04/11 1704 02/04/11 1259 02/04/11 1036 02/04/11 0730 02/03/11 2009 02/03/11 1704  GLUCAP 185* 179* 173* 134* 149* 151*   Scheduled Meds:    . amLODipine  5 mg Oral Daily  . benazepril  20 mg Oral Daily  . enoxaparin  40 mg Subcutaneous Once  . glycopyrrolate      . HYDROmorphone      . insulin aspart  0-9 Units Subcutaneous TID WC  . insulin glargine  15 Units Subcutaneous Daily  . lidocaine      . metroNIDAZOLE      . neostigmine      . ondansetron      . pantoprazole  40 mg Oral BID AC  . polyethylene glycol  17 g Oral Daily  . propofol      . rocuronium      . rocuronium      . sodium chloride      . SUFentanil      . traZODone  50 mg Oral QHS  . DISCONTD: metronidazole  500 mg Intravenous 60 min Pre-Op   Continuous  Infusions:    . DISCONTD: lactated ringers 50 mL/hr at 02/04/11 0737  . DISCONTD: lactated ringers     PRN Meds:.acetaminophen, fentaNYL, HYDROcodone-acetaminophen, HYDROmorphone, HYDROmorphone, ondansetron (ZOFRAN) IV, ondansetron (ZOFRAN) IV, ondansetron (ZOFRAN) IV, ondansetron, senna-docusate, sodium phosphate, DISCONTD: HYDROmorphone, DISCONTD: midazolam, DISCONTD: oxyCODONE, DISCONTD: sodium chloride Assessment/Plan: Patient Active Hospital Problem List: S/p ex lap: pancreatic mass found. Await path. Acute blood loss Anemia (01/26/2011):  monitor DIABETES MELLITUS, TYPE II (09/13/2007): D/C lantus until eating  HYPERTENSION (09/13/2007):  IV vasotec until eating Syncope and collapse (01/26/2011) Hepatic steatosis (01/28/2011) Insomnia:  trazadone prn H pylori   Appreciate consultants   LOS: 9 days   [name] 02/04/2011, 8:27 PM

## 2011-02-04 NOTE — Anesthesia Procedure Notes (Signed)
Procedure Name: Intubation Date/Time: 02/04/2011 8:16 AM Performed by: Carolyne Littles, Yoshika Vensel Pre-anesthesia Checklist: Patient identified, Emergency Drugs available, Suction available, Patient being monitored and Timeout performed Patient Re-evaluated:Patient Re-evaluated prior to inductionOxygen Delivery Method: Circle System Utilized Preoxygenation: Pre-oxygenation with 100% oxygen Intubation Type: IV induction Ventilation: Mask ventilation without difficulty Laryngoscope Size: Miller and 3 Grade View: Grade II Tube type: Oral Number of attempts: 1 Placement Confirmation: ETT inserted through vocal cords under direct vision,  positive ETCO2 and breath sounds checked- equal and bilateral Secured at: 23 cm Tube secured with: Tape

## 2011-02-04 NOTE — Interval H&P Note (Signed)
Pt seen and evaluated in Pre-op holding.  No change from prior evaluations.  Bleeding gastric mass discussed with patient.  Procedure discussed with patient.  Recovery discussed with patient.  To OR as consented for Partial Gastrectomy.

## 2011-02-04 NOTE — Anesthesia Preprocedure Evaluation (Addendum)
Anesthesia Evaluation  Name, MR# and DOB Patient awake  General Assessment Comment  Reviewed: Allergy & Precautions, H&P , NPO status , Patient's Chart, lab work & pertinent test results  History of Anesthesia Complications Negative for: history of anesthetic complications  Airway Mallampati: III  Neck ROM: Full    Dental  (+) Edentulous Upper and Edentulous Lower   Pulmonary  clear to auscultation  breath sounds clear to auscultation none    Cardiovascular hypertension, Pt. on medications Regular Normal    Neuro/Psych    (+) Depression,    GI/Hepatic/Renal Gastric Mass Denies reflux hx   Endo/Other  (+) Diabetes mellitus-, Well Controlled, Type 2, Oral Hypoglycemic Agents,     Abdominal   Musculoskeletal   Hematology   Peds  Reproductive/Obstetrics    Anesthesia Other Findings             Anesthesia Physical Anesthesia Plan  ASA: III  Anesthesia Plan: General   Post-op Pain Management:    Induction: Intravenous  Airway Management Planned: Oral ETT  Additional Equipment:   Intra-op Plan:   Post-operative Plan:   Informed Consent: I have reviewed the patients History and Physical, chart, labs and discussed the procedure including the risks, benefits and alternatives for the proposed anesthesia with the patient or authorized representative who has indicated his/her understanding and acceptance.     Plan Discussed with:   Anesthesia Plan Comments:         Anesthesia Quick Evaluation

## 2011-02-05 LAB — BASIC METABOLIC PANEL
CO2: 21 mEq/L (ref 19–32)
Calcium: 8.7 mg/dL (ref 8.4–10.5)
Glucose, Bld: 177 mg/dL — ABNORMAL HIGH (ref 70–99)
Potassium: 4.3 mEq/L (ref 3.5–5.1)
Sodium: 132 mEq/L — ABNORMAL LOW (ref 135–145)

## 2011-02-05 LAB — CBC
Hemoglobin: 10.7 g/dL — ABNORMAL LOW (ref 12.0–15.0)
MCH: 29.2 pg (ref 26.0–34.0)
RBC: 3.66 MIL/uL — ABNORMAL LOW (ref 3.87–5.11)

## 2011-02-05 LAB — GLUCOSE, CAPILLARY

## 2011-02-05 MED ORDER — BENAZEPRIL HCL 10 MG PO TABS: 20.0000 mg | ORAL_TABLET | Freq: Every day | ORAL | Status: DC

## 2011-02-05 MED ORDER — INSULIN GLARGINE 100 UNIT/ML ~~LOC~~ SOLN
8.0000 [IU] | Freq: Two times a day (BID) | SUBCUTANEOUS | Status: DC
  Administered 2011-02-05 – 2011-02-09 (×9): 8 [IU] via SUBCUTANEOUS

## 2011-02-05 MED ORDER — BENAZEPRIL HCL 10 MG PO TABS
10.0000 mg | ORAL_TABLET | Freq: Every day | ORAL | Status: DC
  Administered 2011-02-05 – 2011-02-09 (×5): 10 mg via ORAL
  Filled 2011-02-05 (×4): qty 1

## 2011-02-05 NOTE — Anesthesia Postprocedure Evaluation (Signed)
  Anesthesia Post-op Note  Patient: [name]  Procedure(s) Performed:  EXPLORATORY LAPAROTOMY  Patient Location: Room 331  Anesthesia Type:   Level of Consciousness: Awake, Sedated  Airway and Oxygen Therapy: Room Air  Post-op Pain:  Some c/o pain, but controlled  Post-op Assessment:   Post-op Vital Signs: Stable  Complications: No anesthesia Related Problems.

## 2011-02-05 NOTE — Progress Notes (Signed)
1 Day Post-Op  Subjective: Pain fairly well control.  No nausea.  No fevers.  Objective: Vital signs in last 24 hours: Temp:  [98.1 F (36.7 C)-99.2 F (37.3 C)] 98.1 F (36.7 C) (08/09 0456) Pulse Rate:  [109-123] 123  (08/09 0456) Resp:  [20-22] 22  (08/09 0456) BP: (107-151)/(69-98) 107/69 mmHg (08/09 0456) SpO2:  [90 %-94 %] 92 % (08/09 0900) Last BM Date: 02/03/11  Intake/Output from previous day: 08/08 0701 - 08/09 0700 In: 2020 [P.O.:120; I.V.:1400; IV Piggyback:500] Out: 375 [Urine:275; Blood:100] Intake/Output this shift:    General appearance: alert and no distress Resp: clear to auscultation bilaterally GI: soft, expected tenderness.  Dressing intact, dry.  Lab Results:  @LABLAST2 (wbc:2,hgb:2,hct:2,plt:2) BMET  Basename 02/05/11 0457  NA 132*  K 4.3  CL 95*  CO2 21  GLUCOSE 177*  BUN 15  CREATININE 1.60*  CALCIUM 8.7   PT/INR No results found for this basename: LABPROT:2,INR:2 in the last 72 hours ABG No results found for this basename: PHART:2,PCO2:2,PO2:2,HCO3:2 in the last 72 hours  Studies/Results: No results found.  Anti-infectives: Anti-infectives    None      Assessment/Plan: s/p Procedure(s): EXPLORATORY LAPAROTOMY Pancreatic/gastric mass.  I discussed with pt events during OR.  High suspicion regarding the panc.  Even if a benign process, the stamoch is intimently assoc with the inflammatory process involving the pancrease.  Continue to allow recovery period.  Await CA 19-9.  Final path pending.  Will need referral to Dr. Marilynn Rail at [facility].  Will work opn this next 24-48hrs.  Given age, doubt patient will be candidate for whipple even if benign process.  LOS: 10 days    Khori Rosevear C 02/05/2011

## 2011-02-05 NOTE — Progress Notes (Signed)
Late entry: Patient was bladder scanned at 2230, found to have 51 cc. Patient urninatated only 25 cc.

## 2011-02-05 NOTE — Progress Notes (Addendum)
Notes reviewed.  Subjective: Pain controlled  Objective: Vital signs in last 24 hours: Filed Vitals:   02/05/11 0456 02/05/11 0900 02/05/11 1000 02/05/11 1400  BP: 107/69  113/64 110/76  Pulse: 123  96 107  Temp: 98.1 F (36.7 C)  98.2 F (36.8 C) 99.4 F (37.4 C)  TempSrc: Oral  Oral Oral  Resp: 22  20 22   Height:      Weight:      SpO2: 90% 92% 94% 91%   Weight change:   Intake/Output Summary (Last 24 hours) at 02/05/11 1817 Last data filed at 02/05/11 1756  Gross per 24 hour  Intake    480 ml  Output     25 ml  Net    455 ml   General appearance: asleep. arousable Lungs: clear to auscultation bilaterally Heart: regular rate and rhythm, S1, S2 normal, no murmur, click, rub or gallop Abdomen: soft Extremities: edema 1+ Lab Results: Basic Metabolic Panel:  Basename 02/05/11 0457  NA 132*  K 4.3  CL 95*  CO2 21  GLUCOSE 177*  BUN 15  CREATININE 1.60*  CALCIUM 8.7  MG --  PHOS --    Basename 02/05/11 0457 02/03/11 0523  WBC 15.7* --  NEUTROABS -- --  HGB 10.7* 10.3*  HCT 32.3* 30.3*  MCV 88.3 --  PLT 442* --    Basename 02/05/11 1618 02/05/11 1127 02/05/11 0728 02/04/11 2032 02/04/11 1704 02/04/11 1259  GLUCAP 184* 125* 170* 169* 185* 179*   Scheduled Meds:    . enalaprilat  0.625 mg Intravenous Q6H  . insulin aspart  0-9 Units Subcutaneous TID WC  . lidocaine      . metroNIDAZOLE      . propofol      . rocuronium      . SUFentanil      . DISCONTD: amLODipine  5 mg Oral Daily  . DISCONTD: benazepril  20 mg Oral Daily  . DISCONTD: glycopyrrolate      . DISCONTD: insulin glargine  15 Units Subcutaneous Daily  . DISCONTD: neostigmine      . DISCONTD: ondansetron      . DISCONTD: pantoprazole  40 mg Oral BID AC  . DISCONTD: polyethylene glycol  17 g Oral Daily  . DISCONTD: rocuronium      . DISCONTD: traZODone  50 mg Oral QHS   Continuous Infusions:   PRN Meds:.acetaminophen, fentaNYL, HYDROcodone-acetaminophen, HYDROmorphone,  HYDROmorphone, ondansetron (ZOFRAN) IV, ondansetron (ZOFRAN) IV, ondansetron (ZOFRAN) IV, ondansetron, senna-docusate, sodium phosphate Assessment/Plan: Patient Active Hospital Problem List: S/p ex lap: pancreatic mass found. Await path and CA-19-9. Dr. Ortencia Kick plans noted.  Acute blood loss Anemia (01/26/2011):  Stable  DIABETES MELLITUS, TYPE II (09/13/2007): Resume Lantus   HYPERTENSION (09/13/2007):  Stable. Resume oral hypertensives  Syncope and collapse (01/26/2011) Hepatic steatosis (01/28/2011) Insomnia:  trazadone prn H pylori   Appreciate consultants   LOS: 10 days   Hayley Horn L 02/05/2011, 6:17 PM

## 2011-02-06 DIAGNOSIS — N179 Acute kidney failure, unspecified: Secondary | ICD-10-CM | POA: Diagnosis not present

## 2011-02-06 DIAGNOSIS — Z9889 Other specified postprocedural states: Secondary | ICD-10-CM

## 2011-02-06 LAB — CBC
HCT: 27.4 % — ABNORMAL LOW (ref 36.0–46.0)
Hemoglobin: 9.3 g/dL — ABNORMAL LOW (ref 12.0–15.0)
MCH: 29.6 pg (ref 26.0–34.0)
MCHC: 33.9 g/dL (ref 30.0–36.0)
MCV: 87.3 fL (ref 78.0–100.0)

## 2011-02-06 NOTE — Progress Notes (Signed)
2 Days Post-Op  Subjective: Pain fairly well controlled.  Tol clears.  Objective: Vital signs in last 24 hours: Temp:  [99.2 F (37.3 C)-100.4 F (38 C)] 99.3 F (37.4 C) (08/10 1013) Pulse Rate:  [96-120] 114  (08/10 1013) Resp:  [19-22] 19  (08/10 1013) BP: (108-157)/(67-89) 157/85 mmHg (08/10 1013) SpO2:  [84 %-93 %] 92 % (08/10 1013) Last BM Date: 02/05/11  Intake/Output from previous day: 08/09 0701 - 08/10 0700 In: 480 [P.O.:480] Out: -  Intake/Output this shift: I/O this shift: In: 240 [P.O.:240] Out: -   General appearance: alert and no distress GI: +BS, soft, Expected tenderness.  Inc c/d/i.  Lab Results:  @LABLAST2 (wbc:2,hgb:2,hct:2,plt:2) BMET  Basename 02/05/11 0457  NA 132*  K 4.3  CL 95*  CO2 21  GLUCOSE 177*  BUN 15  CREATININE 1.60*  CALCIUM 8.7   PT/INR No results found for this basename: LABPROT:2,INR:2 in the last 72 hours ABG No results found for this basename: PHART:2,PCO2:2,PO2:2,HCO3:2 in the last 72 hours  Studies/Results: No results found.  Anti-infectives: Anti-infectives    None      Assessment/Plan: s/p Procedure(s): EXPLORATORY LAPAROTOMY S/p exploration.  Path and CA19-9 are reasuring.  No evidience for Pancreatic CA.  Still to address inflammatory mass, the pancreatic head will have to be addresses.  Hg has been stable and therefore, as long as she continues to progress, out pt eval at [facility] may be an option.  However, pt's age is likely going to be a limiting factor. [employer] diet.  LOS: 11 days    [name] C 02/06/2011

## 2011-02-06 NOTE — Progress Notes (Signed)
Notes reviewed.  Subjective: The patient states that she is just not feeling good. She has had some abdominal discomfort but she does not say that it is severe. Her appetite is fair.  Objective: Vital signs in last 24 hours: Filed Vitals:   02/06/11 0500 02/06/11 0808 02/06/11 1013 02/06/11 1400  BP: 108/67  157/85 133/84  Pulse: 116 96 114 108  Temp: 99.2 F (37.3 C)  99.3 F (37.4 C) 98.9 F (37.2 C)  TempSrc: Oral  Oral Oral  Resp: 22 19 19 19   Height:      Weight:      SpO2: 88% 93% 92% 93%   Weight change:   Intake/Output Summary (Last 24 hours) at 02/06/11 1602 Last data filed at 02/06/11 1245  Gross per 24 hour  Intake    720 ml  Output      1 ml  Net    719 ml   General appearance: asleep. arousable Lungs: clear to auscultation bilaterally Heart: regular rate and rhythm, S1, S2 normal, no murmur, click, rub or gallop Abdomen: soft; staples in place, hypoactive bowel sounds; diffusely mildly tender. Extremities: edema 1+ Lab Results: Basic Metabolic Panel:  Basename 02/05/11 0457  NA 132*  K 4.3  CL 95*  CO2 21  GLUCOSE 177*  BUN 15  CREATININE 1.60*  CALCIUM 8.7  MG --  PHOS --    Basename 02/06/11 0522 02/05/11 0457  WBC 13.6* 15.7*  NEUTROABS -- --  HGB 9.3* 10.7*  HCT 27.4* 32.3*  MCV 87.3 88.3  PLT 443* 442*    Basename 02/06/11 1141 02/05/11 2050 02/05/11 1618 02/05/11 1127 02/05/11 0728 02/04/11 2032  GLUCAP 133* 140* 184* 125* 170* 169*   Scheduled Meds:    . benazepril  10 mg Oral Daily  . insulin aspart  0-9 Units Subcutaneous TID WC  . insulin glargine  8 Units Subcutaneous BID  . DISCONTD: benazepril  20 mg Oral Daily  . DISCONTD: enalaprilat  0.625 mg Intravenous Q6H   Continuous Infusions:   PRN Meds:.acetaminophen, fentaNYL, HYDROcodone-acetaminophen, HYDROmorphone, HYDROmorphone, ondansetron (ZOFRAN) IV, ondansetron, senna-docusate, sodium phosphate Assessment/Plan: Patient Active Hospital Problem List: S/p ex lap:  Pancreatic mass found. Hx of ulcerated gastric mass. Cytology of needle aspirate of pancreatic mass is negative. CA 19-9 is normal. Dr. Neita Carp assessment and recommendations noted.  Acute renal failure. Will monitor her renal function closely. May need to restart IVF. Post op leukocytosis. No fever. Persistent hyponatremia Acute blood loss Anemia (01/26/2011):  Stable  DIABETES MELLITUS, TYPE II (09/13/2007):  Stable  HYPERTENSION (09/13/2007):  Stable.  Syncope and collapse (01/26/2011) Hepatic steatosis (01/28/2011) Insomnia:  trazadone prn H pylori   Appreciate consultants   LOS: 11 days   [name] 02/06/2011, 4:02 PM

## 2011-02-07 ENCOUNTER — Inpatient Hospital Stay (HOSPITAL_COMMUNITY): Payer: Medicare HMO

## 2011-02-07 LAB — GLUCOSE, CAPILLARY
Glucose-Capillary: 133 mg/dL — ABNORMAL HIGH (ref 70–99)
Glucose-Capillary: 180 mg/dL — ABNORMAL HIGH (ref 70–99)
Glucose-Capillary: 135 mg/dL — ABNORMAL HIGH (ref 70–99)

## 2011-02-07 LAB — COMPREHENSIVE METABOLIC PANEL
ALT: 17 U/L (ref 0–35)
CO2: 24 mEq/L (ref 19–32)
Calcium: 8.7 mg/dL (ref 8.4–10.5)
Creatinine, Ser: 0.76 mg/dL (ref 0.50–1.10)
GFR calc Af Amer: >60 mL/min (ref >60)
GFR calc non Af Amer: >60 mL/min (ref >60)
Glucose, Bld: 132 mg/dL — ABNORMAL HIGH (ref 70–99)
Total Bilirubin: 0.5 mg/dL (ref 0.3–1.2)

## 2011-02-07 LAB — CBC
Hemoglobin: 8.7 g/dL — ABNORMAL LOW (ref 12.0–15.0)
MCH: 29.5 pg (ref 26.0–34.0)
MCV: 87.5 fL (ref 78.0–100.0)
RBC: 2.95 MIL/uL — ABNORMAL LOW (ref 3.87–5.11)

## 2011-02-07 LAB — CARDIAC PANEL(CRET KIN+CKTOT+MB+TROPI): Relative Index: 0.9 (ref 0.0–2.5)

## 2011-02-07 MED ORDER — SODIUM CHLORIDE 0.9 % IJ SOLN
INTRAMUSCULAR | Status: AC
  Administered 2011-02-07: 13:00:00
  Filled 2011-02-07: qty 10

## 2011-02-07 MED ORDER — POTASSIUM CHLORIDE IN NACL 20-0.9 MEQ/L-% IV SOLN
INTRAVENOUS | Status: DC
  Administered 2011-02-07 – 2011-02-08 (×2): via INTRAVENOUS

## 2011-02-07 MED ORDER — SODIUM CHLORIDE 0.9 % IJ SOLN
INTRAMUSCULAR | Status: AC
  Administered 2011-02-07: 13:00:00
  Filled 2011-02-07: qty 3

## 2011-02-07 MED ORDER — ENOXAPARIN SODIUM 40 MG/0.4ML ~~LOC~~ SOLN
40.0000 mg | SUBCUTANEOUS | Status: DC
  Administered 2011-02-07: 490 mg via SUBCUTANEOUS
  Filled 2011-02-07: qty 0.4

## 2011-02-07 MED ORDER — ENOXAPARIN SODIUM 40 MG/0.4ML ~~LOC~~ SOLN
SUBCUTANEOUS | Status: AC
  Administered 2011-02-07: 490 mg via SUBCUTANEOUS
  Filled 2011-02-07: qty 0.4

## 2011-02-07 MED ORDER — BISACODYL 10 MG RE SUPP: 10.0000 mg | Freq: Every day | RECTAL | Status: DC | PRN

## 2011-02-07 MED ORDER — IOHEXOL 350 MG/ML SOLN
100.0000 mL | Freq: Once | INTRAVENOUS | Status: AC | PRN
  Administered 2011-02-07: 100 mL via INTRAVENOUS

## 2011-02-07 NOTE — Progress Notes (Signed)
3 Days Post-Op  Subjective: "stomach still hurting"  No change from yesterday.  No progression.  No nausea.  Some SOB.  No chest pain.   Objective: Vital signs in last 24 hours: Temp:  [98.7 F (37.1 C)-99.6 F (37.6 C)] 99.1 F (37.3 C) (08/11 0556) Pulse Rate:  [100-134] 126  (08/11 1205) Resp:  [19-24] 22  (08/11 1205) BP: (109-133)/(67-84) 109/74 mmHg (08/11 1147) SpO2:  [84 %-96 %] 95 % (08/11 1205) Weight:  [92.1 kg (203 lb 0.7 oz)] 203 lb 0.7 oz (92.1 kg) (08/11 0600) Last BM Date: 02/05/11  Intake/Output from previous day: 08/10 0701 - 08/11 0700 In: 780 [P.O.:780] Out: -  Intake/Output this shift: I/O this shift: In: 120 [P.O.:120] Out: 200 [Urine:200]  General appearance: alert and no distress GI: +BS, soft mod distention.  Expected tenderness.  Inc c/d/i.  No peritoneal signs.    Lab Results:  @LABLAST2 (wbc:2,hgb:2,hct:2,plt:2) BMET  Basename 02/07/11 0709 02/05/11 0457  NA 130* 132*  K 3.8 4.3  CL 94* 95*  CO2 24 21  GLUCOSE 132* 177*  BUN 20 15  CREATININE 0.76 1.60*  CALCIUM 8.7 8.7   PT/INR No results found for this basename: LABPROT:2,INR:2 in the last 72 hours ABG No results found for this basename: PHART:2,PCO2:2,PO2:2,HCO3:2 in the last 72 hours  Studies/Results: Ct Angio Chest W/cm &/or Wo Cm  02/07/2011  *RADIOLOGY REPORT*  Clinical Data:  Chest pain, evaluate for PE  CT ANGIOGRAPHY CHEST WITH CONTRAST  Technique:  Multidetector CT imaging of the chest was performed using the standard protocol during bolus administration of intravenous contrast.  Multiplanar CT image reconstructions including MIPs were obtained to evaluate the vascular anatomy.  Contrast:  100 ml Omnipaque-300 IV  Comparison:  None.  Findings:  No evidence of pulmonary embolism.  Mild patchy opacity in the lingula and medial left lower lobe, likely atelectasis.  Trace left pleural effusion.  No pneumothorax.  The heart is top normal in size.  Coronary atherosclerosis.  Small  posterior pericardial fluid.  No suspicious mediastinal, hilar, or axillary lymphadenopathy.  Visualized upper abdomen is unremarkable.  Degenerative changes of the visualized thoracolumbar spine.  Review of the MIP images confirms the above findings.  IMPRESSION: No evidence of pulmonary embolism.  Original Report Authenticated By: Charline Bills, M.D.    Anti-infectives: Anti-infectives    None      Assessment/Plan: s/p Procedure(s): EXPLORATORY LAPAROTOMY CT of Chest noted.  Does have some gastric distention.  D/w patient backing off PO for now.  Increase activity.  Hg down slightly.  Monitor.  If drops to 8 would consider tx.    LOS: 12 days    Danica Camarena C 02/07/2011

## 2011-02-07 NOTE — Progress Notes (Signed)
   Subjective: The patient complained of abdominal pain, shortness of breath, and just not feeling right earlier. She has had no vomiting but she has had some nausea. She denies chest pain.  Objective: Vital signs in last 24 hours: Filed Vitals:   02/07/11 0752 02/07/11 1147 02/07/11 1200 02/07/11 1205  BP:  109/74    Pulse: 105 134 130 126  Temp:      TempSrc:      Resp:    22  Height:      Weight:      SpO2: 91%  84% 95%   Weight change:   Intake/Output Summary (Last 24 hours) at 02/07/11 1547 Last data filed at 02/07/11 0900  Gross per 24 hour  Intake    420 ml  Output    200 ml  Net    220 ml   General appearance: Awake, cooperative, and appears ill. Lungs: clear to auscultation bilaterally Heart:: S1-S2 with tachycardia and soft systolic murmur appear Abdomen: soft; mildly distended, staples in place, hypoactive bowel sounds; diffusely mildly tender. Extremities: Trace of pedal edema. Lab Results: Basic Metabolic Panel:  Basename 02/07/11 0709 02/05/11 0457  NA 130* 132*  K 3.8 4.3  CL 94* 95*  CO2 24 21  GLUCOSE 132* 177*  BUN 20 15  CREATININE 0.76 1.60*  CALCIUM 8.7 8.7  MG -- --  PHOS -- --    Basename 02/07/11 0709 02/06/11 0522  WBC 11.5* 13.6*  NEUTROABS -- --  HGB 8.7* 9.3*  HCT 25.8* 27.4*  MCV 87.5 87.3  PLT 384 443*    Basename 02/07/11 1127 02/07/11 0744 02/06/11 2207 02/06/11 1630 02/06/11 1141 02/05/11 2050  GLUCAP 180* 133* 136* 154* 133* 140*   Scheduled Meds:    . benazepril  10 mg Oral Daily  . insulin aspart  0-9 Units Subcutaneous TID WC  . insulin glargine  8 Units Subcutaneous BID  . sodium chloride      . sodium chloride       Continuous Infusions:    . 0.9 % NaCl with KCl 20 mEq / L 100 mL/hr at 02/07/11 1200   PRN Meds:.acetaminophen, bisacodyl, fentaNYL, HYDROcodone-acetaminophen, HYDROmorphone, HYDROmorphone, iohexol, ondansetron (ZOFRAN) IV, ondansetron, senna-docusate, sodium  phosphate Assessment/Plan: Patient Active Hospital Problem List: Shortness of breath and sinus tachycardia.  CT angiogram of her chest was ordered earlier and it was negative for PE. Her EKG revealed sinus tachycardia with no other acute changes. Cardiac enzymes are within normal limits. BNP is within normal limits. We will start low-dose prophylactic Lovenox. S/p ex lap: Pancreatic mass found. Hx of ulcerated gastric mass. Cytology of needle aspirate of pancreatic mass is negative. CA 19-9 is normal. Dr. Neita Carp assessment and recommendations noted.  Abdominal distention, query ileus. Per Dr. Leticia Penna. Acute renal failure. Resolved with IV fluids. Post op leukocytosis. No fever. Persistent hyponatremia.  IV fluids with normal saline resumed  Acute blood loss Anemia (01/26/2011):  The patient was transfused at least 3-4 units earlier during the hospitalization. Her hemoglobin has trended downward since the operation. Will follow for need of additional transfusion.  DIABETES MELLITUS, TYPE II (09/13/2007):  Stable  HYPERTENSION (09/13/2007):  Stable.    Syncope and collapse (01/26/2011) Hepatic steatosis (01/28/2011) Insomnia:  trazadone prn H pylori   Appreciate consultants   LOS: 12 days   Charnee Turnipseed 02/07/2011, 3:47 PM

## 2011-02-08 DIAGNOSIS — I313 Pericardial effusion (noninflammatory): Secondary | ICD-10-CM | POA: Diagnosis not present

## 2011-02-08 DIAGNOSIS — R5082 Postprocedural fever: Secondary | ICD-10-CM | POA: Diagnosis not present

## 2011-02-08 LAB — URINALYSIS, ROUTINE W REFLEX MICROSCOPIC
Bilirubin Urine: NEGATIVE
Ketones, ur: NEGATIVE mg/dL
Nitrite: NEGATIVE
Protein, ur: NEGATIVE mg/dL
Urobilinogen, UA: 0.2 mg/dL (ref 0.0–1.0)

## 2011-02-08 LAB — GLUCOSE, CAPILLARY
Glucose-Capillary: 151 mg/dL — ABNORMAL HIGH (ref 70–99)
Glucose-Capillary: 198 mg/dL — ABNORMAL HIGH (ref 70–99)
Glucose-Capillary: 110 mg/dL — ABNORMAL HIGH (ref 70–99)

## 2011-02-08 LAB — BASIC METABOLIC PANEL
CO2: 23 mEq/L (ref 19–32)
Calcium: 8.1 mg/dL — ABNORMAL LOW (ref 8.4–10.5)
Creatinine, Ser: 0.76 mg/dL (ref 0.50–1.10)
GFR calc non Af Amer: >60 mL/min (ref >60)
Glucose, Bld: 159 mg/dL — ABNORMAL HIGH (ref 70–99)
Sodium: 131 mEq/L — ABNORMAL LOW (ref 135–145)

## 2011-02-08 LAB — TYPE AND SCREEN
ABO/RH(D): A POS
Unit division: 0

## 2011-02-08 LAB — CBC
MCH: 28.6 pg (ref 26.0–34.0)
MCV: 87.6 fL (ref 78.0–100.0)
Platelets: 366 10*3/uL (ref 150–400)
RDW: 15.4 % (ref 11.5–15.5)

## 2011-02-08 LAB — HEMOGLOBIN AND HEMATOCRIT, BLOOD: Hemoglobin: 6.2 g/dL — CL (ref 12.0–15.0)

## 2011-02-08 LAB — URINE MICROSCOPIC-ADD ON

## 2011-02-08 MED ORDER — PANTOPRAZOLE SODIUM 40 MG IV SOLR
40.0000 mg | Freq: Two times a day (BID) | INTRAVENOUS | Status: DC
  Administered 2011-02-08 – 2011-02-12 (×10): 40 mg via INTRAVENOUS
  Filled 2011-02-08 (×10): qty 40

## 2011-02-08 MED ORDER — HYDROMORPHONE 0.3 MG/ML IV SOLN
INTRAVENOUS | Status: AC
  Administered 2011-02-08: 0.3 mg via INTRAVENOUS
  Filled 2011-02-08: qty 25

## 2011-02-08 MED ORDER — SODIUM CHLORIDE 0.9 % IJ SOLN
9.0000 mL | INTRAMUSCULAR | Status: DC | PRN
  Administered 2011-02-09: 10 mL via INTRAVENOUS

## 2011-02-08 MED ORDER — DIPHENHYDRAMINE HCL 50 MG/ML IJ SOLN
12.5000 mg | Freq: Four times a day (QID) | INTRAMUSCULAR | Status: DC | PRN
  Filled 2011-02-08: qty 1

## 2011-02-08 MED ORDER — DIPHENHYDRAMINE HCL 12.5 MG/5ML PO ELIX
12.5000 mg | ORAL_SOLUTION | Freq: Four times a day (QID) | ORAL | Status: DC | PRN
  Filled 2011-02-08: qty 5

## 2011-02-08 MED ORDER — NALOXONE HCL 0.4 MG/ML IJ SOLN: 0.4000 mg | INTRAMUSCULAR | Status: DC | PRN

## 2011-02-08 MED ORDER — FUROSEMIDE 10 MG/ML IJ SOLN
10.0000 mg | Freq: Once | INTRAMUSCULAR | Status: AC
  Administered 2011-02-08: 10 mg via INTRAVENOUS
  Filled 2011-02-08: qty 2

## 2011-02-08 MED ORDER — LEVOFLOXACIN IN D5W 500 MG/100ML IV SOLN
500.0000 mg | INTRAVENOUS | Status: DC
  Administered 2011-02-08 – 2011-02-11 (×4): 500 mg via INTRAVENOUS
  Filled 2011-02-08 (×5): qty 100

## 2011-02-08 MED ORDER — VANCOMYCIN HCL IN DEXTROSE 1-5 GM/200ML-% IV SOLN
1000.0000 mg | Freq: Two times a day (BID) | INTRAVENOUS | Status: DC
  Administered 2011-02-08 – 2011-02-11 (×7): 1000 mg via INTRAVENOUS
  Filled 2011-02-08 (×9): qty 200

## 2011-02-08 MED ORDER — ONDANSETRON HCL 4 MG/2ML IJ SOLN
4.0000 mg | Freq: Four times a day (QID) | INTRAMUSCULAR | Status: DC | PRN
  Administered 2011-02-09 – 2011-02-10 (×2): 4 mg via INTRAVENOUS

## 2011-02-08 MED ORDER — HYDROMORPHONE 0.3 MG/ML IV SOLN
INTRAVENOUS | Status: AC
  Filled 2011-02-08: qty 25

## 2011-02-08 MED ORDER — HYDROMORPHONE 0.3 MG/ML IV SOLN
INTRAVENOUS | Status: DC
  Administered 2011-02-08: 0.3 mg via INTRAVENOUS
  Administered 2011-02-09: 25 mL via INTRAVENOUS
  Administered 2011-02-09: 10:00:00 via INTRAVENOUS
  Administered 2011-02-09: 3.3 mg via INTRAVENOUS
  Administered 2011-02-10: 2.04 mg via INTRAVENOUS
  Administered 2011-02-10: 2.1 mg via INTRAVENOUS
  Administered 2011-02-10: 18:00:00 via INTRAVENOUS
  Administered 2011-02-10: 2.7 mg via INTRAVENOUS
  Administered 2011-02-10: 1.32 mg via INTRAVENOUS
  Administered 2011-02-11: 15:00:00 via INTRAVENOUS
  Administered 2011-02-11: 1.91 mg via INTRAVENOUS
  Administered 2011-02-11: 06:00:00 via INTRAVENOUS

## 2011-02-08 NOTE — Progress Notes (Signed)
4 Days Post-Op  Subjective: Pain there but better.  No nausea.  No fevers.  Limited flatus.  Small BM.  No CP or SOB.  Objective: Vital signs in last 24 hours: Temp:  [98.8 F (37.1 C)-100.8 F (38.2 C)] 99 F (37.2 C) (08/12 1335) Pulse Rate:  [98-119] 108  (08/12 1335) Resp:  [16-20] 18  (08/12 1335) BP: (126-145)/(78-83) 145/78 mmHg (08/12 1335) SpO2:  [92 %-97 %] 97 % (08/12 0724) Weight:  [90.1 kg (198 lb 10.2 oz)] 198 lb 10.2 oz (90.1 kg) (08/12 0600) Last BM Date: 02/05/11  Intake/Output from previous day: 08/11 0701 - 08/12 0700 In: 2160 [P.O.:360; I.V.:1800] Out: 400 [Urine:400] Intake/Output this shift: I/O this shift: In: 1110 [P.O.:560; I.V.:250; Blood:300] Out: 200 [Urine:200]  General appearance: alert and no distress GI: Present but interrmittent BS.  Soft.  Expected tenderness.  Inc c/d/i.  Lab Results:  @LABLAST2 (wbc:2,hgb:2,hct:2,plt:2) BMET  Basename 02/08/11 0549 02/07/11 0709  NA 131* 130*  K 4.3 3.8  CL 97 94*  CO2 23 24  GLUCOSE 159* 132*  BUN 29* 20  CREATININE 0.76 0.76  CALCIUM 8.1* 8.7   PT/INR No results found for this basename: LABPROT:2,INR:2 in the last 72 hours ABG No results found for this basename: PHART:2,PCO2:2,PO2:2,HCO3:2 in the last 72 hours  Studies/Results: Ct Angio Chest W/cm &/or Wo Cm  02/07/2011  *RADIOLOGY REPORT*  Clinical Data:  Chest pain, evaluate for PE  CT ANGIOGRAPHY CHEST WITH CONTRAST  Technique:  Multidetector CT imaging of the chest was performed using the standard protocol during bolus administration of intravenous contrast.  Multiplanar CT image reconstructions including MIPs were obtained to evaluate the vascular anatomy.  Contrast:  100 ml Omnipaque-300 IV  Comparison:  None.  Findings:  No evidence of pulmonary embolism.  Mild patchy opacity in the lingula and medial left lower lobe, likely atelectasis.  Trace left pleural effusion.  No pneumothorax.  The heart is top normal in size.  Coronary  atherosclerosis.  Small posterior pericardial fluid.  No suspicious mediastinal, hilar, or axillary lymphadenopathy.  Visualized upper abdomen is unremarkable.  Degenerative changes of the visualized thoracolumbar spine.  Review of the MIP images confirms the above findings.  IMPRESSION: No evidence of pulmonary embolism.  Original Report Authenticated By: Charline Bills, M.D.    Anti-infectives: Anti-infectives    None      Assessment/Plan: s/p Procedure(s): EXPLORATORY LAPAROTOMY Anemia:  currently recieving bld tx.  Continue to monitor.  aDVANCE DIET AS TOLERATED AS BOWEL FXN INCREASES.  Increase activity with assistance.  LOS: 13 days    Jeanluc Wegman C 02/08/2011

## 2011-02-08 NOTE — Progress Notes (Signed)
ANTIBIOTIC CONSULT NOTE - INITIAL  Pharmacy Consult for Vancomycin  Indication: cellulitis  Patient Measurements: Height: 5' 6.5" (168.9 cm) Weight: 198 lb 10.2 oz (90.1 kg) IBW/kg (Calculated) : 60.45    Vital Signs: Temp: 99.4 F (37.4 C) (08/12 0600) BP: 129/81 mmHg (08/12 0600) Pulse Rate: 98  (08/12 0724) Intake/Output from previous day: 08/11 0701 - 08/12 0700 In: 2160 [P.O.:360; I.V.:1800] Out: 400 [Urine:400] Intake/Output from this shift: I/O this shift: In: 360 [P.O.:360] Out: -   Labs:  Basename 02/08/11 0700 02/08/11 0549 02/07/11 0709 02/06/11 0522  WBC -- 8.4 11.5* 13.6*  HGB 6.2* 6.2* 8.7* --  PLT -- 366 384 443*  LABCREA -- -- -- --  CREATININE -- 0.76 0.76 --  CRCLEARANCE -- -- -- --   No results found for this basename: VANCOTROUGH:2,VANCOPEAK:2,VANCORANDOM:2,GENTTROUGH:2,GENTPEAK:2,GENTRANDOM:2,TOBRATROUGH:2,TOBRAPEAK:2,TOBRARND:2,AMIKACINPEAK:2,AMIKACINTROU:2,AMIKACIN:2, in the last 72 hours   Microbiology: Recent Results (from the past 720 hour(s))  SURGICAL PCR SCREEN     Status: Normal   Collection Time   01/23/11 11:30 AM      Component Value Range Status Comment   MRSA, PCR NEGATIVE  NEGATIVE  Final    Staphylococcus aureus NEGATIVE  NEGATIVE  Final     Medical History: Past Medical History  Diagnosis Date  . Hypertension   . Reflux   . Right elbow pain     OTIF  . Dementia   . Depression   . Osteoarthritis   . Pancreatitis 11/2007    HOP  . Angiomyolipoma of kidney     right  . Ulcerative esophagitis 12/05/2007    hx elevated gastrin, severe on EGD by Dr Jena Gauss , h pylori negative  . Hiatal hernia   . S/P colonoscopy 2009    pt reports normal by Dr Lovell Sheehan  . Diabetes mellitus   . Anemia      Assessment: Ok for protocol   Goal of Therapy:  Vancomycin trough level 10-15 mcg/ml  Plan:  Measure antibiotic drug levels at steady state  [address], [place] 02/08/2011,10:01 AM

## 2011-02-08 NOTE — Progress Notes (Signed)
Subjective: She still has epigastric abd pain, but a little less than yesterday. Says she had a BM, but it's not recorded. She doesn't know if it had blood in it.  Objective: Vital signs in last 24 hours: Filed Vitals:   02/07/11 1954 02/07/11 2200 02/08/11 0600 02/08/11 0724  BP:  140/83 129/81   Pulse:  110 111 98  Temp:  100.8 F (38.2 C) 99.4 F (37.4 C)   TempSrc:      Resp:  16 20   Height:      Weight:   90.1 kg (198 lb 10.2 oz)   SpO2: 95% 95% 93% 97%   Weight change: -2 kg (-4 lb 6.6 oz)  Intake/Output Summary (Last 24 hours) at 02/08/11 0807 Last data filed at 02/08/11 0600  Gross per 24 hour  Intake   2160 ml  Output    400 ml  Net   1760 ml   General appearance: Awake, cooperative, and appears ill. Lungs: clear to auscultation bilaterally Heart:: S1-S2 with mild tachycardia and soft systolic murmur appear Abdomen: soft; mildly distended epigastrium, staples in place, erythema around the staples, no drainage; hypoactive bowel sounds; diffusely mildly tender. Extremities: Trace of pedal edema. Lab Results: Basic Metabolic Panel:  Basename 02/08/11 0549 02/07/11 0709  NA 131* 130*  K 4.3 3.8  CL 97 94*  CO2 23 24  GLUCOSE 159* 132*  BUN 29* 20  CREATININE 0.76 0.76  CALCIUM 8.1* 8.7  MG -- --  PHOS -- --    Basename 02/08/11 0700 02/08/11 0549 02/07/11 0709  WBC -- 8.4 11.5*  NEUTROABS -- -- --  HGB 6.2* 6.2* --  HCT 18.9* 19.0* --  MCV -- 87.6 87.5  PLT -- 366 384    Basename 02/07/11 2138 02/07/11 1624 02/07/11 1127 02/07/11 0744 02/06/11 2207 02/06/11 1630  GLUCAP 135* 152* 180* 133* 136* 154*   Scheduled Meds:    . benazepril  10 mg Oral Daily  . enoxaparin  40 mg Subcutaneous Q24H  . furosemide  10 mg Intravenous Once  . insulin aspart  0-9 Units Subcutaneous TID WC  . insulin glargine  8 Units Subcutaneous BID  . levofloxacin (LEVAQUIN) IV  500 mg Intravenous Q24H  . sodium chloride      . sodium chloride       Continuous  Infusions:    . 0.9 % NaCl with KCl 20 mEq / L 100 mL/hr at 02/08/11 0244   PRN Meds:.acetaminophen, bisacodyl, fentaNYL, HYDROcodone-acetaminophen, HYDROmorphone, HYDROmorphone, iohexol, ondansetron (ZOFRAN) IV, ondansetron, senna-docusate, sodium phosphate Assessment/Plan: Patient Active Hospital Problem List: Shortness of breath and sinus tachycardia. Resolved. CT angiogram of her chest was ordered earlier and it was negative for PE. Her EKG revealed sinus tachycardia with no other acute changes. Cardiac enzymes are within normal limits. BNP is within normal limits. Started low-dose prophylactic Lovenox.  Abdominal Wall Cellulitis:  around staples. Febrile overnight.  Will start Vanco and Levaquin.   S/p ex lap: Pancreatic mass found. Hx of ulcerated gastric mass.  Cytology of needle aspirate of pancreatic mass is negative. CA 19-9 is normal. Dr. Neita Carp assessment and recommendations noted.   Abdominal distention, query ileus. Per Dr. Leticia Penna.  Acute renal failure. Resolved with IV fluids. Post op leukocytosis. Resolved Persistent hyponatremia.  IV fluids with normal saline resumed.  Acute blood loss Anemia (01/26/2011): Reviewed meds; she is not on Protonix and should be; will restart IV. Prophylactic Lovenox  started yesterday.  Her Hb has decreased again; now  at 6.2, likely ongoing GI blood loss. Will tranasfuse her 2 additional units of blood and check a CBC 1 hour afterwards.   The patient was transfused at least 3-4 units earlier during the hospitalization.    Type 2 DM (09/13/2007):  Stable    Hypertension (09/13/2007):  Stable.    Syncope and collapse (01/26/2011) Hepatic steatosis (01/28/2011) Insomnia:  trazadone prn H pylori   Appreciate consultants   LOS: 13 days   [name] 02/08/2011, 8:07 AM

## 2011-02-09 DIAGNOSIS — I517 Cardiomegaly: Secondary | ICD-10-CM

## 2011-02-09 LAB — CBC
Platelets: 325 10*3/uL (ref 150–400)
RBC: 2.55 MIL/uL — ABNORMAL LOW (ref 3.87–5.11)
RDW: 14.9 % (ref 11.5–15.5)
WBC: 10.4 10*3/uL (ref 4.0–10.5)

## 2011-02-09 LAB — URINALYSIS, ROUTINE W REFLEX MICROSCOPIC
Glucose, UA: NEGATIVE mg/dL
Hgb urine dipstick: NEGATIVE
Ketones, ur: NEGATIVE mg/dL
Protein, ur: NEGATIVE mg/dL

## 2011-02-09 LAB — BASIC METABOLIC PANEL
CO2: 21 mEq/L (ref 19–32)
Chloride: 95 mEq/L — ABNORMAL LOW (ref 96–112)
GFR calc Af Amer: >60 mL/min (ref >60)
Sodium: 127 mEq/L — ABNORMAL LOW (ref 135–145)

## 2011-02-09 LAB — SODIUM, URINE, RANDOM: Sodium, Ur: 96 mEq/L

## 2011-02-09 LAB — GLUCOSE, CAPILLARY
Glucose-Capillary: 165 mg/dL — ABNORMAL HIGH (ref 70–99)
Glucose-Capillary: 196 mg/dL — ABNORMAL HIGH (ref 70–99)

## 2011-02-09 LAB — URINE CULTURE

## 2011-02-09 MED ORDER — SODIUM CHLORIDE 0.9 % IJ SOLN
INTRAMUSCULAR | Status: AC
  Filled 2011-02-09: qty 10

## 2011-02-09 MED ORDER — SODIUM CHLORIDE 0.9 % IJ SOLN
INTRAMUSCULAR | Status: AC
  Administered 2011-02-09: 10 mL via INTRAVENOUS
  Filled 2011-02-09: qty 10

## 2011-02-09 MED ORDER — HYDROMORPHONE 0.3 MG/ML IV SOLN
INTRAVENOUS | Status: AC
  Filled 2011-02-09: qty 25

## 2011-02-09 MED ORDER — HYDROMORPHONE 0.3 MG/ML IV SOLN
INTRAVENOUS | Status: AC
  Administered 2011-02-09: 25 mL via INTRAVENOUS
  Filled 2011-02-09: qty 25

## 2011-02-09 MED ORDER — FUROSEMIDE 10 MG/ML IJ SOLN
20.0000 mg | Freq: Once | INTRAMUSCULAR | Status: AC
  Administered 2011-02-09: 20 mg via INTRAVENOUS
  Filled 2011-02-09: qty 2

## 2011-02-09 MED ORDER — POTASSIUM CHLORIDE IN NACL 20-0.9 MEQ/L-% IV SOLN
INTRAVENOUS | Status: DC
  Administered 2011-02-09: 1000 mL via INTRAVENOUS

## 2011-02-09 NOTE — Progress Notes (Signed)
*  PRELIMINARY RESULTS* Echocardiogram 2D Echocardiogram has been performed.  [name] 02/09/2011, 2:43 PM

## 2011-02-09 NOTE — Progress Notes (Signed)
5 Days Post-Op  Subjective: Abdominal pain slightly improved. She denies any nausea. She denies any chest pain or shortness of breath.  Objective: Vital signs in last 24 hours: Temp:  [98.1 F (36.7 C)-99.8 F (37.7 C)] 99.8 F (37.7 C) (08/13 0451) Pulse Rate:  [89-119] 119  (08/13 0451) Resp:  [16-22] 16  (08/13 0939) BP: (111-159)/(69-87) 159/86 mmHg (08/13 0451) SpO2:  [90 %-99 %] 94 % (08/13 0939) Last BM Date: 02/05/11  Intake/Output from previous day: 08/12 0701 - 08/13 0700 In: 2686.7 [P.O.:760; I.V.:500; Blood:1016.7; IV Piggyback:410] Out: 200 [Urine:200] Intake/Output this shift: I/O this shift: In: 200 [P.O.:200] Out: -   General appearance: alert and no distress Eyes: conjunctivae/corneas clear. PERRL, EOM's intact. Fundi benign., No conjunctival pallor GI: Soft positive bowel sounds, anticipated abdominal pain. Incision is clean dry and intact.  Lab Results:  @LABLAST2 (wbc:2,hgb:2,hct:2,plt:2) BMET  Basename 02/09/11 0446 02/08/11 0549  NA 127* 131*  K 4.0 4.3  CL 95* 97  CO2 21 23  GLUCOSE 163* 159*  BUN 20 29*  CREATININE 0.60 0.76  CALCIUM 8.4 8.1*   PT/INR No results found for this basename: LABPROT:2,INR:2 in the last 72 hours ABG No results found for this basename: PHART:2,PCO2:2,PO2:2,HCO3:2 in the last 72 hours  Studies/Results: No results found.  Anti-infectives: Anti-infectives    None      Assessment/Plan: s/p Procedure(s): EXPLORATORY LAPAROTOMY Duodenal inflammatory mass. Involvement of pancreatic head. Again workup to date has not demonstrated any evidence of a neoplastic or malignant etiology. She does continue to demonstrate suspicion of ongoing GI bleeding. Continue transfusion as required. Awaiting confirmation from [facility] whether they would consider her a candidate for pancreatic resection given her age. Alternatives may include interventional radiology involvement.  LOS: 14 days    [name] C 02/09/2011

## 2011-02-09 NOTE — Progress Notes (Signed)
Subjective: She still has epigastric abd pain, but a little less than yesterday. She has had mild nausea but no vomiting. No bowel movement overnight. Her appetite is poor.  Objective: Vital signs in last 24 hours: Filed Vitals:   02/09/11 0939 02/09/11 1400 02/09/11 1445 02/09/11 1515  BP:  139/66 129/81 144/75  Pulse:  115 108 110  Temp:  99.8 F (37.7 C) 100.3 F (37.9 C) 100.3 F (37.9 C)  TempSrc:  Oral Oral Oral  Resp: 16 20 18 18   Height:      Weight:      SpO2: 94%      Weight change:   Intake/Output Summary (Last 24 hours) at 02/09/11 1633 Last data filed at 02/09/11 1430  Gross per 24 hour  Intake 3164.17 ml  Output      0 ml  Net 3164.17 ml   General appearance: Awake, cooperative, and appears ill. Lungs: clear to auscultation bilaterally Heart:: S1-S2 with mild tachycardia and soft systolic murmur appear Abdomen: soft; mildly distended epigastrium, staples in place, erythema around the staples, no drainage; hypoactive bowel sounds; diffusely mildly tender. Extremities: Trace of pedal edema. Lab Results: Basic Metabolic Panel:  Basename 02/09/11 0446 02/08/11 0549  NA 127* 131*  K 4.0 4.3  CL 95* 97  CO2 21 23  GLUCOSE 163* 159*  BUN 20 29*  CREATININE 0.60 0.76  CALCIUM 8.4 8.1*  MG -- --  PHOS -- --    Basename 02/09/11 0446 02/08/11 0700 02/08/11 0549  WBC 10.4 -- 8.4  NEUTROABS -- -- --  HGB 7.6* 6.2* --  HCT 22.3* 18.9* --  MCV 87.5 -- 87.6  PLT 325 -- 366    Basename 02/09/11 1127 02/09/11 0757 02/08/11 1621 02/08/11 1053 02/08/11 0729 02/07/11 2138  GLUCAP 196* 165* 110* 198* 151* 135*   Scheduled Meds:    . benazepril  10 mg Oral Daily  . enoxaparin  40 mg Subcutaneous Q24H  . furosemide  20 mg Intravenous Once  . HYDROmorphone PCA 0.3 mg/mL   Intravenous Q4H  . HYDROmorphone PCA 0.3 mg/mL      . insulin aspart  0-9 Units Subcutaneous TID WC  . insulin glargine  8 Units Subcutaneous BID  . levofloxacin (LEVAQUIN) IV  500 mg  Intravenous Q24H  . pantoprazole (PROTONIX) IV  40 mg Intravenous Q12H  . sodium chloride      . vancomycin  1,000 mg Intravenous Q12H   Continuous Infusions:    . 0.9 % NaCl with KCl 20 mEq / L    . DISCONTD: 0.9 % NaCl with KCl 20 mEq / L 100 mL/hr at 02/08/11 0244   PRN Meds:.acetaminophen, bisacodyl, diphenhydrAMINE, diphenhydrAMINE, fentaNYL, HYDROcodone-acetaminophen, HYDROmorphone, HYDROmorphone, naloxone, ondansetron (ZOFRAN) IV, ondansetron (ZOFRAN) IV, ondansetron, senna-docusate, sodium chloride, sodium phosphate Assessment/Plan: Patient Active Hospital Problem List: Shortness of breath and sinus tachycardia. Resolved. CT angiogram of her chest was negative for PE. Her EKG revealed sinus tachycardia with no other acute changes. Cardiac enzymes were within normal limits. BNP was within normal limits. Started low-dose prophylactic Lovenox.  Abdominal Wall Cellulitis:  around staples. Started Vanco and Levaquin.   S/p ex lap: Pancreatic /Ulcerated Gastric /Duodenal Inflammatory Mass .  Cytology of needle aspirate of pancreatic mass is negative. Protonix restarted. CA 19-9 is normal. Dr. Neita Carp assessment and recommendations noted. Awaiting response [facility] at [facility].  H. pylori infection. Will consider starting treatment if the patient can tolerate it orally.  Abdominal distention, query ileus. Per  Dr. Leticia Penna.  Persistent hyponatremia. IV fluids were resumed yesterday however her serum sodium has decreased. We will check sodium studies. We will decrease the rate of IV fluids from 100 cc an hour to 50 cc an hour. Will give her Lasix between units of blood today.  Acute renal failure. Resolved with IV fluids.  Post op leukocytosis. Resolved   Acute blood loss Anemia (01/26/2011):   The patient was transfused 2 units of packed red blood cells yesterday. She was transfused at least 3-4 units earlier during the hospitalization. Protonix was restarted  empirically yesterday. We will discontinue Lovenox and that was started yesterday for prophylaxis.   Type 2 DM (09/13/2007):  Stable    Hypertension (09/13/2007):  Stable.    Syncope and collapse (01/26/2011) Hepatic steatosis (01/28/2011) Insomnia:  trazadone prn   Appreciate consultants   LOS: 14 days   [name] 02/09/2011, 4:33 PM

## 2011-02-09 NOTE — Progress Notes (Signed)
ANTIBIOTIC CONSULT NOTE   Pharmacy Consult for Vancomycin  Indication: cellulitis  Patient Measurements: Height: 5' 6.5" (168.9 cm) Weight: 198 lb 10.2 oz (90.1 kg) IBW/kg (Calculated) : 60.45    Vital Signs: Temp: 99.8 F (37.7 C) (08/13 0451) Temp src: Oral (08/13 0451) BP: 159/86 mmHg (08/13 0451) Pulse Rate: 119  (08/13 0451) Intake/Output from previous day: 08/12 0701 - 08/13 0700 In: 2686.7 [P.O.:760; I.V.:500; Blood:1016.7; IV Piggyback:410] Out: 200 [Urine:200]  Labs:  Basename 02/09/11 0446 02/08/11 0700 02/08/11 0549 02/07/11 0709  WBC 10.4 -- 8.4 11.5*  HGB 7.6* 6.2* 6.2* --  PLT 325 -- 366 384  LABCREA -- -- -- --  CREATININE 0.60 -- 0.76 0.76  CRCLEARANCE -- -- -- --   Microbiology: Recent Results (from the past 720 hour(s))  SURGICAL PCR SCREEN     Status: Normal   Collection Time   01/23/11 11:30 AM      Component Value Range Status Comment   MRSA, PCR NEGATIVE  NEGATIVE  Final    Staphylococcus aureus NEGATIVE  NEGATIVE  Final     Medical History: Past Medical History  Diagnosis Date  . Hypertension   . Reflux   . Right elbow pain     OTIF  . Dementia   . Depression   . Osteoarthritis   . Pancreatitis 11/2007    HOP  . Angiomyolipoma of kidney     right  . Ulcerative esophagitis 12/05/2007    hx elevated gastrin, severe on EGD by Dr Jena Gauss , h pylori negative  . Hiatal hernia   . S/P colonoscopy 2009    pt reports normal by Dr Lovell Sheehan  . Diabetes mellitus   . Anemia    Assessment: Good renal fxn  Goal of Therapy:  Vancomycin trough level 10-15 mcg/ml  Plan:  Vancomycin trough level tomorrow F/u SCr  Margo Aye, Masiya Claassen A 02/09/2011,10:40 AM

## 2011-02-10 ENCOUNTER — Inpatient Hospital Stay (HOSPITAL_COMMUNITY): Payer: Medicare HMO

## 2011-02-10 ENCOUNTER — Ambulatory Visit (HOSPITAL_COMMUNITY)
Admit: 2011-02-10 | Discharge: 2011-02-10 | Disposition: A | Discharge status: Home / Self Care | Payer: Medicare HMO | Attending: Interventional Radiology | Admitting: Interventional Radiology

## 2011-02-10 DIAGNOSIS — K319 Disease of stomach and duodenum, unspecified: Secondary | ICD-10-CM | POA: Insufficient documentation

## 2011-02-10 DIAGNOSIS — K922 Gastrointestinal hemorrhage, unspecified: Secondary | ICD-10-CM | POA: Insufficient documentation

## 2011-02-10 LAB — CBC
Hemoglobin: 8.2 g/dL — ABNORMAL LOW (ref 12.0–15.0)
MCHC: 35 g/dL (ref 30.0–36.0)
Platelets: 171 10*3/uL (ref 150–400)
Platelets: 161 10*3/uL (ref 150–400)
RBC: 2.79 MIL/uL — ABNORMAL LOW (ref 3.87–5.11)
RBC: 2.61 MIL/uL — ABNORMAL LOW (ref 3.87–5.11)
RDW: 14.5 % (ref 11.5–15.5)
WBC: 10.1 10*3/uL (ref 4.0–10.5)
WBC: 8.4 10*3/uL (ref 4.0–10.5)

## 2011-02-10 LAB — BASIC METABOLIC PANEL
Chloride: 96 mEq/L (ref 96–112)
GFR calc Af Amer: >60 mL/min (ref >60)
Potassium: 4.1 mEq/L (ref 3.5–5.1)

## 2011-02-10 LAB — HEMOGLOBIN AND HEMATOCRIT, BLOOD: HCT: 20.8 % — ABNORMAL LOW (ref 36.0–46.0)

## 2011-02-10 LAB — GLUCOSE, CAPILLARY
Glucose-Capillary: 216 mg/dL — ABNORMAL HIGH (ref 70–99)
Glucose-Capillary: 162 mg/dL — ABNORMAL HIGH (ref 70–99)
Glucose-Capillary: 163 mg/dL — ABNORMAL HIGH (ref 70–99)

## 2011-02-10 LAB — APTT: aPTT: 36 seconds (ref 24–37)

## 2011-02-10 LAB — VANCOMYCIN, TROUGH: Vancomycin Tr: 14.7 ug/mL (ref 10.0–20.0)

## 2011-02-10 LAB — PREPARE RBC (CROSSMATCH)

## 2011-02-10 LAB — OSMOLALITY: Osmolality: 277 mOsm/kg (ref 275–300)

## 2011-02-10 LAB — OSMOLALITY, URINE: Osmolality, Ur: 254 mOsm/kg — ABNORMAL LOW (ref 390–1090)

## 2011-02-10 LAB — MRSA PCR SCREENING: MRSA by PCR: NEGATIVE

## 2011-02-10 MED ORDER — SODIUM CHLORIDE 0.9 % IJ SOLN
INTRAMUSCULAR | Status: AC
  Filled 2011-02-10: qty 10

## 2011-02-10 MED ORDER — SODIUM CHLORIDE 0.9 % IJ SOLN
10.0000 mL | Freq: Two times a day (BID) | INTRAMUSCULAR | Status: DC
  Administered 2011-02-10 – 2011-02-20 (×20): 10 mL via INTRAVENOUS
  Filled 2011-02-10: qty 30
  Filled 2011-02-10: qty 20
  Filled 2011-02-10: qty 10
  Filled 2011-02-10 (×2): qty 30
  Filled 2011-02-10 (×4): qty 10
  Filled 2011-02-10: qty 30
  Filled 2011-02-10: qty 10
  Filled 2011-02-10: qty 30
  Filled 2011-02-10: qty 10
  Filled 2011-02-10: qty 20
  Filled 2011-02-10 (×3): qty 10

## 2011-02-10 MED ORDER — FUROSEMIDE 10 MG/ML IJ SOLN
20.0000 mg | Freq: Once | INTRAMUSCULAR | Status: AC
  Administered 2011-02-10: 20 mg via INTRAVENOUS
  Filled 2011-02-10: qty 2

## 2011-02-10 MED ORDER — KCL IN DEXTROSE-NACL 20-5-0.9 MEQ/L-%-% IV SOLN
INTRAVENOUS | Status: DC
  Administered 2011-02-10 – 2011-02-11 (×2): via INTRAVENOUS
  Administered 2011-02-15: 20 mL/h via INTRAVENOUS

## 2011-02-10 MED ORDER — LIDOCAINE HCL (PF) 1 % IJ SOLN: 5.0000 mL | Freq: Once | INTRAMUSCULAR | Status: DC

## 2011-02-10 MED ORDER — INSULIN ASPART 100 UNIT/ML ~~LOC~~ SOLN
0.0000 [IU] | SUBCUTANEOUS | Status: DC
  Administered 2011-02-10 (×2): 1 [IU] via SUBCUTANEOUS
  Administered 2011-02-10: 3 [IU] via SUBCUTANEOUS
  Administered 2011-02-12 (×3): 1 [IU] via SUBCUTANEOUS
  Administered 2011-02-13 (×3): 0 [IU] via SUBCUTANEOUS
  Administered 2011-02-17 – 2011-02-18 (×2): 1 [IU] via SUBCUTANEOUS

## 2011-02-10 MED ORDER — ACETAMINOPHEN 650 MG RE SUPP: 650.0000 mg | RECTAL | Status: DC | PRN

## 2011-02-10 MED ORDER — LIDOCAINE HCL 2 % IJ SOLN
INTRAMUSCULAR | Status: AC
  Administered 2011-02-10: 10 mL via SUBCUTANEOUS
  Filled 2011-02-10: qty 1

## 2011-02-10 MED ORDER — INSULIN ASPART 100 UNIT/ML ~~LOC~~ SOLN: 3.0000 [IU] | SUBCUTANEOUS | Status: DC

## 2011-02-10 MED ORDER — IOHEXOL 300 MG/ML  SOLN
100.0000 mL | Freq: Once | INTRAMUSCULAR | Status: AC | PRN
  Administered 2011-02-10: 55 mL via INTRAVENOUS

## 2011-02-10 MED ORDER — SODIUM CHLORIDE 0.9 % IJ SOLN
10.0000 mL | INTRAMUSCULAR | Status: DC | PRN
  Administered 2011-02-12 – 2011-02-20 (×11): 10 mL via INTRAVENOUS
  Filled 2011-02-10 (×2): qty 20
  Filled 2011-02-10 (×3): qty 10

## 2011-02-10 MED ORDER — HYDROMORPHONE 0.3 MG/ML IV SOLN
INTRAVENOUS | Status: AC
  Filled 2011-02-10: qty 25

## 2011-02-10 NOTE — Progress Notes (Signed)
Nocturnist is on-call note: Called to see patient after the 11:10 PM a history of vomiting blood blood pressure falling into the 70's and tachycardic. Patient completed transfusion of 2 units of packed red cells 2 hours earlier  On arrival patient complained of nausea and feeling very weak and wanting to sleep.  GEN: Sweaty and clammy, lethargic., Liquid vomited blood on her night shirt. Vitals: BP 75/49, pulse 123, respiration 20, temp 98.6;  Pre-transfusion hemoglobin 7.6   Assessment: Upper GI bleed likely acute on chronic Impending hypovolemic shock Non-bleeding gastric mass Other chronic medical problems  Plan: Check coags; recheck H&H; Give IV fluids while preparing FFP and packed red cells emergently. Transfer to ICU Discuss patient with her surgeon Since patient indicates she is too weak to make decisions, will contact her Step-Daughter, at her request. Will need central line.  Total Critical care time directly managing patient, discussing her care with surgeon and her step daughter,45 minutes.  She remains a Full Code.

## 2011-02-10 NOTE — Progress Notes (Signed)
INITIAL ADULT NUTRITION ASSESSMENT Date: 02/10/2011   Time: 4:25 PM Reason for Assessment: Incr nutritional risk  ASSESSMENT: Female 77 y.o.  Dx: Anemia  Hx:  Past Medical History  Diagnosis Date  . Hypertension   . Reflux   . Right elbow pain     OTIF  . Dementia   . Depression   . Osteoarthritis   . Pancreatitis 11/2007    HOP  . Angiomyolipoma of kidney     right  . Ulcerative esophagitis 12/05/2007    hx elevated gastrin, severe on EGD by Dr Jena Gauss , h pylori negative  . Hiatal hernia   . S/P colonoscopy 2009    pt reports normal by Dr Lovell Sheehan  . Diabetes mellitus   . Anemia     Scheduled Meds:   . furosemide  20 mg Intravenous Once  . furosemide  20 mg Intravenous Once  . HYDROmorphone PCA 0.3 mg/mL   Intravenous Q4H  . insulin aspart  0-3 Units Subcutaneous Q4H  . levofloxacin (LEVAQUIN) IV  500 mg Intravenous Q24H  . lidocaine      . pantoprazole (PROTONIX) IV  40 mg Intravenous Q12H  . sodium chloride      . sodium chloride      . sodium chloride      . sodium chloride      . vancomycin  1,000 mg Intravenous Q12H  . DISCONTD: benazepril  10 mg Oral Daily  . DISCONTD: enoxaparin  40 mg Subcutaneous Q24H  . DISCONTD: insulin aspart  0-9 Units Subcutaneous TID WC  . DISCONTD: insulin aspart  3 Units Subcutaneous Q4H  . DISCONTD: insulin glargine  8 Units Subcutaneous BID  . DISCONTD: lidocaine  5 mL Infiltration Once   Continuous Infusions:   . dextrose 5 % and 0.9 % NaCl with KCl 20 mEq/L 75 mL/hr at 02/10/11 1100  . DISCONTD: 0.9 % NaCl with KCl 20 mEq / L 100 mL/hr at 02/08/11 0244  . DISCONTD: 0.9 % NaCl with KCl 20 mEq / L 50 mL/hr at 02/10/11 0600   PRN Meds:.acetaminophen, bisacodyl, diphenhydrAMINE, diphenhydrAMINE, fentaNYL, HYDROmorphone, HYDROmorphone, naloxone, ondansetron (ZOFRAN) IV, ondansetron (ZOFRAN) IV, sodium chloride, sodium phosphate, DISCONTD: acetaminophen, DISCONTD: HYDROcodone-acetaminophen, DISCONTD: ondansetron, DISCONTD:  senna-docusate  Ht: 5' 6.5" (168.9 cm)  Wt: 203 lb 14.8 oz (92.5 kg)  Ideal Wt: 60.45 kg 64.95 kg % Ideal Wt: 154 %  Usual Wt: 182 # % Usual Wt: 112 %  Body mass index is 32.42 kg/(m^2).  Food/Nutrition Related Hx: Pt able to provide limited nutrition hx. Reports usual body wt 182#. C/o abdominal pain. Received full liquid diet x4d. Po intake 50%. Pt needs assist with meals. Current diet NPO. Significant wt gain noted 12% above UBW. Pt high risk for malnutrition given incr LOS and limited po intake.   Labs: Results for [name], [name] (MRN [id number]) as of 02/10/2011 16:21  Ref. Range 02/10/2011 07:54 02/10/2011 08:54 02/10/2011 08:55 02/10/2011 10:20 02/10/2011 14:03  Sodium Latest Range: 135-145 mEq/L  130 (L)     Potassium Latest Range: 3.5-5.1 mEq/L  4.1     Chloride Latest Range: 96-112 mEq/L  96     CO2 Latest Range: 19-32 mEq/L  24     BUN Latest Range: 6-23 mg/dL  34 (H)     Creat Latest Range: 0.50-1.10 mg/dL  4.09     Calcium Latest Range: 8.4-10.5 mg/dL  8.0 (L)     GFR calc non Af Amer Latest Range: >60 mL/min  >60  GFR calc Af Amer Latest Range: >60 mL/min  >60     Glucose Latest Range: 70-99 mg/dL  161 (H)     Glucose-Capillary Latest Range: 70-99 mg/dL 096 (H)   045 (H)   Uric Acid, Serum Latest Range: 2.4-7.0 mg/dL  5.7     Osmolality Latest Range: 275-300 mOsm/kg  277     WBC Latest Range: 4.0-10.5 K/uL  10.1     RBC Latest Range: 3.87-5.11 MIL/uL  2.79 (L)     HGB Latest Range: 12.0-15.0 g/dL  8.4 (L)     HCT Latest Range: 36.0-46.0 %  23.9 (L)     MCV Latest Range: 78.0-100.0 fL  85.7     MCH Latest Range: 26.0-34.0 pg  30.1     MCHC Latest Range: 30.0-36.0 g/dL  40.9     RDW Latest Range: 11.5-15.5 %  13.8     Platelets Latest Range: 150-400 K/uL  171     Vancomycin Tr Latest Range: 10.0-20.0 ug/mL   14.7    IR ANGIOGRAM SELECTIVE EACH ADDITIONAL VESSEL No range found     Rpt  IR ANGIOGRAM FOLLOW UP STUDY No range found     Rpt  IR ANGIOGRAM VISCERAL  SELECTIVE No range found     Rpt  IR TRANSCATH/EMBOLIZ No range found     Rpt  IR US GUIDE VASC ACCESS RIGHT No range found     Rpt     Intake/Output Summary (Last 24 hours) at 02/10/11 1642 Last data filed at 02/10/11 1100  Gross per 24 hour  Intake 2562.5 ml  Output   1375 ml  Net 1187.5 ml    Diet Order: NPO  Supplements/Tube Feeding: None at this time  IVF:    dextrose 5 % and 0.9 % NaCl with KCl 20 mEq/L Last Rate: 75 mL/hr at 02/10/11 1100  DISCONTD: 0.9 % NaCl with KCl 20 mEq / L Last Rate: 100 mL/hr at 02/08/11 0244  DISCONTD: 0.9 % NaCl with KCl 20 mEq / L Last Rate: 50 mL/hr at 02/10/11 0600    Estimated Nutritional Needs:   Kcal:1300-1400 Protein:106-118 grams  Fluid:1300-1400 mL  NUTRITION DIAGNOSIS:   -Inadequate oral intake (NI-2.1).  Status: Ongoing  RELATED TO: altered GI function  AS EVIDENCE BY: GI bleed and duodenal lesion/pancreatic lesion   MONITORING/EVALUATION(Goals): - Meet > 75% energy and protein needs. Goal not met.  - Obtain/maintain UBW range. Goal not met.  - Will monitor pending transfer.   EDUCATION NEEDS: - Education not appropriate at this time  INTERVENTION: - Will evaluate for appropriate suppl when diet is adv.    Dietitian # [id number]  DOCUMENTATION CODES Per approved criteria  -Obesity Unspecified    Francene Boyers 02/10/2011, 4:25 PM

## 2011-02-10 NOTE — Procedures (Signed)
Central Venous Catheter Insertion Procedure Note TEEGAN GUINTHER [id number] 09-15-1931  Procedure: Insertion of Central Venous Catheter Indications: Assessment of intravascular volume, Drug and/or fluid administration, Frequent blood sampling and inadaquate IV access and poor peripherals  Procedure Details Consent: Risks of procedure as well as the alternatives and risks of each were explained to the (patient/caregiver).  Consent for procedure obtained. Time Out: Verified patient identification, verified procedure, site/side was marked, verified correct patient position, special equipment/implants available, medications/allergies/relevent history reviewed, required imaging and test results available.  Performed  Maximum sterile technique was used including antiseptics, cap, gloves, gown, hand hygiene, mask and sheet. Skin prep: Chlorhexidine; local anesthetic administered A antimicrobial bonded/coated triple lumen catheter was placed in the left subclavian vein using the Seldinger technique.  Evaluation Blood flow good Complications: No apparent complications Patient did tolerate procedure well. Chest X-ray ordered to verify placement.  CXR: pending.  Makaylen Thieme C 02/10/2011, 2:35 AM

## 2011-02-10 NOTE — Progress Notes (Signed)
Subjective: Events noted for hypotension and hematemesis. Pt transferred to the ICU, given IVF bolus and now 2 more units of blood being given and 1unit of FFP. Pt says her abd pain is being managed with PCA. She had a small dark BM yesterday.  Objective: Vital signs in last 24 hours: Filed Vitals:   02/10/11 0530 02/10/11 0545 02/10/11 0600 02/10/11 0800  BP: 110/55 103/48 95/50 166/72  Pulse: 112 106 102 114  Temp: 98.8 F (37.1 C)   99.7 F (37.6 C)  TempSrc: Oral   Oral  Resp: 20 22 21 14   Height:      Weight:      SpO2:   100% 100%   Weight change:   Intake/Output Summary (Last 24 hours) at 02/10/11 0851 Last data filed at 02/10/11 0600  Gross per 24 hour  Intake 3862.5 ml  Output    150 ml  Net 3712.5 ml   General appearance: Awake, cooperative, and appears ill; moaning. Lungs: clear to auscultation bilaterally Heart:: S1-S2 with mild tachycardia and soft systolic murmur. Abdomen: soft; mildly distended epigastrium, staples in place, erythema around the staples, no drainage; hypoactive bowel sounds; diffusely  tender. Extremities: Trace of pedal edema. Neuro: Alert and oriented. Lab Results: Basic Metabolic Panel:  Basename 02/09/11 0446 02/08/11 0549  NA 127* 131*  K 4.0 4.3  CL 95* 97  CO2 21 23  GLUCOSE 163* 159*  BUN 20 29*  CREATININE 0.60 0.76  CALCIUM 8.4 8.1*  MG -- --  PHOS -- --    Basename 02/09/11 2324 02/09/11 0446 02/08/11 0549  WBC -- 10.4 8.4  NEUTROABS -- -- --  HGB 7.2* 7.6* --  HCT 20.8* 22.3* --  MCV -- 87.5 87.6  PLT -- 325 366    Basename 02/09/11 2036 02/09/11 1646 02/09/11 1127 02/09/11 0757 02/08/11 1621 02/08/11 1053  GLUCAP 180* 110* 196* 165* 110* 198*   Scheduled Meds:    . furosemide  20 mg Intravenous Once  . furosemide  20 mg Intravenous Once  . HYDROmorphone PCA 0.3 mg/mL   Intravenous Q4H  . insulin aspart  0-3 Units Subcutaneous Q4H  . levofloxacin (LEVAQUIN) IV  500 mg Intravenous Q24H  . lidocaine        . pantoprazole (PROTONIX) IV  40 mg Intravenous Q12H  . sodium chloride      . sodium chloride      . sodium chloride      . vancomycin  1,000 mg Intravenous Q12H  . DISCONTD: benazepril  10 mg Oral Daily  . DISCONTD: enoxaparin  40 mg Subcutaneous Q24H  . DISCONTD: insulin aspart  0-9 Units Subcutaneous TID WC  . DISCONTD: insulin aspart  3 Units Subcutaneous Q4H  . DISCONTD: insulin glargine  8 Units Subcutaneous BID  . DISCONTD: lidocaine  5 mL Infiltration Once   Continuous Infusions:    . dextrose 5 % and 0.9 % NaCl with KCl 20 mEq/L    . DISCONTD: 0.9 % NaCl with KCl 20 mEq / L 100 mL/hr at 02/08/11 0244  . DISCONTD: 0.9 % NaCl with KCl 20 mEq / L 50 mL/hr at 02/10/11 0600   PRN Meds:.acetaminophen, bisacodyl, diphenhydrAMINE, diphenhydrAMINE, fentaNYL, HYDROmorphone, HYDROmorphone, naloxone, ondansetron (ZOFRAN) IV, ondansetron (ZOFRAN) IV, sodium chloride, sodium phosphate, DISCONTD: acetaminophen, DISCONTD: HYDROcodone-acetaminophen, DISCONTD: ondansetron, DISCONTD: senna-docusate Assessment/Plan: Patient Active Hospital Problem List: Hematemesis/GI Blood Loss secondary to Pancreatic/Gastric/Duodenal Mass and Hypotension More blood and FFP being transfused. Follow CBC every 6 hours.  BP  better. Will give lasix after units. IV Protonix twice daily continues. Add D5 to IVF for nutritional support. Will discuss with Dr. Leticia Penna further about transferring  Pt to an appropriate facility.  Shortness of breath and sinus tachycardia. Resolved. CT angiogram of her chest was negative for PE. Her EKG revealed sinus tachycardia with no other acute changes. Cardiac enzymes were within normal limits. BNP was within normal limits. Started low-dose prophylactic Lovenox.  Abdominal Wall Cellulitis:  around staples. Started Vanco and Levaquin.   S/p ex lap: Pancreatic /Ulcerated Gastric /Duodenal Inflammatory Mass .  Cytology of needle aspirate of pancreatic mass is negative.   H. pylori  infection. Will consider starting treatment if the patient can tolerate it orally.  Abdominal distention, query ileus. Per Dr. Leticia Penna.  Persistent hyponatremia. IV fluids were resumed, however her serum sodium has decreased. We will check sodium studies.    Acute renal failure. Resolved with IV fluids.  Post op leukocytosis. Resolved   Acute blood loss Anemia (01/26/2011):   The patient was transfused 2 units of packed red blood cells yesterday. She was transfused at least 3-4 units earlier during the hospitalization. Protonix was restarted empirically yesterday. We will discontinue Lovenox and that was started yesterday for prophylaxis.   Type 2 DM (09/13/2007):  Stable    Hypertension (09/13/2007):  Stable.    Syncope and collapse (01/26/2011) Hepatic steatosis (01/28/2011) Insomnia:  trazadone prn   Appreciate consultants   LOS: 15 days   Imer Foxworth 02/10/2011, 8:51 AM

## 2011-02-10 NOTE — Progress Notes (Signed)
Pt. Returned to ICU bed 6 s/p angiogram and embolization of vessel. Rt. Groin with gauze and tegaderm dressing, no drainage present. VSS.

## 2011-02-10 NOTE — Progress Notes (Signed)
  Called regarding patient status by hospitalist.  Apparently blood pressure was low on the floor.  Pt denies any significant changes.  No increase pain.  No CP or SOB.  No lightheadedness.  Patient did reported receive the 2 units of pRBCs this AM that were ordered but did not appear in the results as a transfusion.  No sig improvement in H&H.  Per RN, pt did have an episode of bloody emesis.  No change with BM.  No melena or bloody BM.  Patient's exam has not changed either.  Vitals were reviewed.  Currently remains tachycardic in 115-125.  BP: 87/48.  Resp are unlabored at 20.  Will proceed with CVC placement and monitoring.  Pt is not a candidate for take back for the duodenal lesion/pancreatic lesion at our facility due to the pancreatic component.  As mentioned in my note yesterday as well as my discussion with the hospitalist, if remains unstable and unable to transfer to [facility] for definitive surgery, IR embolization may also be a consideration.

## 2011-02-10 NOTE — Progress Notes (Signed)
Pt. Transferred to [facility] via [facility] to Interventional Radiology for Mesenteric Angiogram with possible embolization.

## 2011-02-10 NOTE — Progress Notes (Signed)
ANTIBIOTIC CONSULT NOTE   Pharmacy Consult for Vancomycin  Indication: cellulitis  Patient Measurements: Height: 5' 6.5" (168.9 cm) Weight: 203 lb 14.8 oz (92.5 kg) IBW/kg (Calculated) : 60.45   Vital Signs: Temp: 99.7 F (37.6 C) (08/14 0800) Temp src: Oral (08/14 0800) BP: 144/75 mmHg (08/14 1035) Pulse Rate: 115  (08/14 1035) Intake/Output from previous day: 08/13 0701 - 08/14 0700 In: 3862.5 [P.O.:720; I.V.:1884.5; Blood:1050; IV Piggyback:208] Out: 300 [Urine:300]  Labs:  [facility] 02/10/11 0854 02/09/11 2324 02/09/11 0446 02/08/11 0549  WBC 10.1 -- 10.4 8.4  HGB 8.4* 7.2* 7.6* --  PLT 171 -- 325 366  LABCREA -- -- -- --  CREATININE 0.87 -- 0.60 0.76  CRCLEARANCE -- -- -- --   RECENT VANCOMYCIN TROUGH LEVELS: Recent Labs  [facility] 02/10/11 0855   VANCOTROUGH 14.7    Microbiology: Recent Results (from the past 720 hour(s))  SURGICAL PCR SCREEN     Status: Normal   Collection Time   01/23/11 11:30 AM      Component Value Range Status Comment   MRSA, PCR NEGATIVE  NEGATIVE  Final    Staphylococcus aureus NEGATIVE  NEGATIVE  Final   URINE CULTURE     Status: Normal   Collection Time   02/08/11 10:01 AM      Component Value Range Status Comment   Specimen Description URINE, CLEAN CATCH   Final    Special Requests NONE   Final    Setup Time [id number]   Final    Colony Count 60,000 COLONIES/ML   Final    Culture     Final    Value: Multiple bacterial morphotypes present, none predominant. Suggest appropriate recollection if clinically indicated.   Report Status 02/09/2011 FINAL   Final   CULTURE, BLOOD (ROUTINE X 2)     Status: Normal (Preliminary result)   Collection Time   02/09/11  6:15 PM      Component Value Range Status Comment   Specimen Description BLOOD LEFT HAND   Final    Special Requests BOTTLES DRAWN AEROBIC AND ANAEROBIC 4CC EACH   Final    Culture NO GROWTH 1 DAY   Final    Report Status PENDING   Incomplete   CULTURE, BLOOD (ROUTINE X 2)      Status: Normal (Preliminary result)   Collection Time   02/09/11  6:25 PM      Component Value Range Status Comment   Specimen Description BLOOD SITE NOT SPECIFIED   Final    Special Requests BOTTLES DRAWN AEROBIC ONLY 6CC   Final    Culture NO GROWTH 1 DAY   Final    Report Status PENDING   Incomplete   MRSA PCR SCREENING     Status: Normal   Collection Time   02/10/11 12:47 AM      Component Value Range Status Comment   MRSA by PCR NEGATIVE  NEGATIVE  Final     Medical History: Past Medical History  Diagnosis Date  . Hypertension   . Reflux   . Right elbow pain     OTIF  . Dementia   . Depression   . Osteoarthritis   . Pancreatitis 11/2007    HOP  . Angiomyolipoma of kidney     right  . Ulcerative esophagitis 12/05/2007    hx elevated gastrin, severe on EGD by Dr Jena Gauss , h pylori negative  . Hiatal hernia   . S/P colonoscopy 2009    pt reports normal by Dr  Jenkins  . Diabetes mellitus   . Anemia    Assessment: Good renal fxn Vanco trough on target.  Goal of Therapy:  Vancomycin trough level 10-15 mcg/ml  Plan:  Continue Vancomycin... Duration per MD  Valrie Hart A 02/10/2011,10:41 AM

## 2011-02-10 NOTE — Progress Notes (Signed)
Received patient from 300,  RN Drinda Butts Goins reporting off.  Patient is oriented to situation.  Weak and states in abdominal pain receiving PCA dilaudid for pain.  BP on transfer is 88/54 map of 62 and tachycardic with a HR of 119 sustained.  sats 100 RR is 26.  [facility] notified of transfer. [name] RN 02/10/11 0030

## 2011-02-11 ENCOUNTER — Encounter (HOSPITAL_COMMUNITY): Payer: Self-pay | Admitting: General Surgery

## 2011-02-11 LAB — COMPREHENSIVE METABOLIC PANEL
AST: 14 U/L (ref 0–37)
BUN: 25 mg/dL — ABNORMAL HIGH (ref 6–23)
CO2: 25 mEq/L (ref 19–32)
Calcium: 8.2 mg/dL — ABNORMAL LOW (ref 8.4–10.5)
Creatinine, Ser: 0.63 mg/dL (ref 0.50–1.10)
GFR calc non Af Amer: >60 mL/min (ref >60)

## 2011-02-11 LAB — URINE CULTURE
Colony Count: NO GROWTH
Culture: NO GROWTH

## 2011-02-11 LAB — CBC
Hemoglobin: 7.3 g/dL — ABNORMAL LOW (ref 12.0–15.0)
MCHC: 34.6 g/dL (ref 30.0–36.0)
Platelets: 143 10*3/uL — ABNORMAL LOW (ref 150–400)
Platelets: 152 10*3/uL (ref 150–400)
RDW: 14.6 % (ref 11.5–15.5)
RDW: 14.8 % (ref 11.5–15.5)
WBC: 7.9 10*3/uL (ref 4.0–10.5)

## 2011-02-11 LAB — GLUCOSE, CAPILLARY
Glucose-Capillary: 155 mg/dL — ABNORMAL HIGH (ref 70–99)
Glucose-Capillary: 147 mg/dL — ABNORMAL HIGH (ref 70–99)
Glucose-Capillary: 139 mg/dL — ABNORMAL HIGH (ref 70–99)
Glucose-Capillary: 143 mg/dL — ABNORMAL HIGH (ref 70–99)
Glucose-Capillary: 159 mg/dL — ABNORMAL HIGH (ref 70–99)

## 2011-02-11 LAB — PREPARE FRESH FROZEN PLASMA: Unit division: 0

## 2011-02-11 LAB — HEMOGLOBIN AND HEMATOCRIT, BLOOD: HCT: 28.5 % — ABNORMAL LOW (ref 36.0–46.0)

## 2011-02-11 MED ORDER — NALOXONE HCL 0.4 MG/ML IJ SOLN: 0.4000 mg | INTRAMUSCULAR | Status: DC | PRN

## 2011-02-11 MED ORDER — HYDROMORPHONE 0.3 MG/ML IV SOLN
INTRAVENOUS | Status: AC
  Filled 2011-02-11: qty 25

## 2011-02-11 MED ORDER — HYDROMORPHONE 0.3 MG/ML IV SOLN
INTRAVENOUS | Status: DC
  Administered 2011-02-11 – 2011-02-12 (×2): via INTRAVENOUS
  Administered 2011-02-12 (×2): 0.03 mL via INTRAVENOUS
  Administered 2011-02-13: 3 mg via INTRAVENOUS

## 2011-02-11 MED ORDER — DIPHENHYDRAMINE HCL 50 MG/ML IJ SOLN: 12.5000 mg | Freq: Four times a day (QID) | INTRAMUSCULAR | Status: DC | PRN

## 2011-02-11 MED ORDER — DIPHENHYDRAMINE HCL 12.5 MG/5ML PO ELIX
12.5000 mg | ORAL_SOLUTION | Freq: Four times a day (QID) | ORAL | Status: DC | PRN
  Filled 2011-02-11: qty 5

## 2011-02-11 NOTE — Progress Notes (Signed)
UR Chart Review Completed  

## 2011-02-11 NOTE — Progress Notes (Signed)
Gave pt a bath and again attempted to talk pt into getting up out of bed to chair.  Pt states that she does not want to get up and that she "is hurting too much today in her belly.  Maybe I will feel like it tomorrow.  Pt has refused to get up to chair three times today when nursing staff has tried to encourage her to do so.  Have explained to pt that she may feel better when she gets up and may pass more gas but she says that she is not getting up right now.  Will continue to work with pt and encourage her to get up out of bed.

## 2011-02-11 NOTE — Progress Notes (Signed)
7 Days Post-Op  Subjective: Feels better.  No nausea.  Pain has improved somewhat.  No CP or SOB.  Some flatus but no BM today.    Objective: Vital signs in last 24 hours: Temp:  [98.6 F (37 C)-99.5 F (37.5 C)] 98.8 F (37.1 C) (08/15 0700) Pulse Rate:  [49-116] 95  (08/15 1200) Resp:  [15-23] 21  (08/15 1200) BP: (105-161)/(53-80) 120/54 mmHg (08/15 1200) SpO2:  [82 %-100 %] 97 % (08/15 1200) FiO2 (%):  [97 %] 97 % (08/14 1735) Weight:  [92.6 kg (204 lb 2.3 oz)] 204 lb 2.3 oz (92.6 kg) (08/15 0400) Last BM Date: 02/10/11  Intake/Output from previous day: 08/14 0701 - 08/15 0700 In: 1656.3 [I.V.:1656.3] Out: 3375 [Urine:3375] Intake/Output this shift: I/O this shift: In: 175 [I.V.:75; IV Piggyback:100] Out: -   General appearance: alert and no distress Eyes: PERR, EOMI, No conjuct. palor. GI: +BS, soft, mod midline tenderness. No peritoneal signs.  Incision is c/d/i.  Lab Results:  @LABLAST2 (wbc:2,hgb:2,hct:2,plt:2) BMET  Basename 02/11/11 0539 02/10/11 0854  NA 135 130*  K 3.6 4.1  CL 101 96  CO2 25 24  GLUCOSE 142* 157*  BUN 25* 34*  CREATININE 0.63 0.87  CALCIUM 8.2* 8.0*   PT/INR  Basename 02/09/11 2324  LABPROT 14.9  INR 1.15   ABG No results found for this basename: PHART:2,PCO2:2,PO2:2,HCO3:2 in the last 72 hours  Studies/Results: Ir Angiogram Visceral Selective  02/10/2011  *RADIOLOGY REPORT*  Clinical Data: Upper GI bleed secondary to inflammatory duodenal/pancreatic lesion.  SELECTIVE VISCERAL ARTERIOGRAPHY,ULTRASOUND VENOUS ACCESS,ADDITIONAL ARTERIOGRAPHY,ARTERIOGRAPHY,TRANSCATHETER THERAPY EMBOLIZATION  Comparison: 01/27/2011  Technique and findings:  The procedure, risks (including but not limited to bleeding, infection, organ damage), benefits, and alternatives were explained to the patient.  Questions regarding the procedure were encouraged and answered.  The patient understands and consents to the procedure. The right femoral region prepped  and draped usual sterile fashion. Maximal barrier sterile technique was utilized including caps, mask, sterile gowns, sterile gloves, sterile drape, hand hygiene and skin antiseptic.  Intravenous Fentanyl and Versed were administered as conscious sedation during continuous cardiorespiratory monitoring by the radiology RN, with a total moderate sedation time of 32 minutes.  Under real time ultrasound guidance, the right common femoral artery was accessed with   21-gauge micropuncture needle using single-wall technique in a single pass.  Ultrasound documentation was stored.  The needle was exchanged over a 0.018-inch guide wire for a transitional dilator, which allowed passage of a Teena Dunk guide wire into the abdominal aorta. Over this, a 5-French vascular sheath was placed.  Through this, 5-French C2 catheter was advanced and used to selectively catheterize the celiac axis for arteriography.  The gastroduodenal artery was identified.  With an angled glide wire, the G D A was catheterized.  Selective arteriography confirmed appropriate catheter position. Parenchymal blush was evident with no overt active extravasation or other focal arterial lesion.  The G D A was embolized with three, four, and 5 mm fibroid embolization coils to cessation of flow.  Follow-up arteriography demonstrates no further flow through the treated segment.  Continued good flow into the common hepatic artery.  No apparent complication.  After confirmatory femoral arteriography, the sheath was removed and hemostasis achieved with the Exoseal device. The patient tolerated the procedure well.  No immediate complication  IMPRESSION:  1.  Technically successful coil embolization of the gastroduodenal artery to control the upper GI bleed.  Original Report Authenticated By: Osa Craver, M.D.   Ir Angiogram Selective  Each Additional Vessel  02/10/2011  *RADIOLOGY REPORT*  Clinical Data: Upper GI bleed secondary to inflammatory  duodenal/pancreatic lesion.  SELECTIVE VISCERAL ARTERIOGRAPHY,ULTRASOUND VENOUS ACCESS,ADDITIONAL ARTERIOGRAPHY,ARTERIOGRAPHY,TRANSCATHETER THERAPY EMBOLIZATION  Comparison: 01/27/2011  Technique and findings:  The procedure, risks (including but not limited to bleeding, infection, organ damage), benefits, and alternatives were explained to the patient.  Questions regarding the procedure were encouraged and answered.  The patient understands and consents to the procedure. The right femoral region prepped and draped usual sterile fashion. Maximal barrier sterile technique was utilized including caps, mask, sterile gowns, sterile gloves, sterile drape, hand hygiene and skin antiseptic.  Intravenous Fentanyl and Versed were administered as conscious sedation during continuous cardiorespiratory monitoring by the radiology RN, with a total moderate sedation time of 32 minutes.  Under real time ultrasound guidance, the right common femoral artery was accessed with   21-gauge micropuncture needle using single-wall technique in a single pass.  Ultrasound documentation was stored.  The needle was exchanged over a 0.018-inch guide wire for a transitional dilator, which allowed passage of a Teena Dunk guide wire into the abdominal aorta. Over this, a 5-French vascular sheath was placed.  Through this, 5-French C2 catheter was advanced and used to selectively catheterize the celiac axis for arteriography.  The gastroduodenal artery was identified.  With an angled glide wire, the G D A was catheterized.  Selective arteriography confirmed appropriate catheter position. Parenchymal blush was evident with no overt active extravasation or other focal arterial lesion.  The G D A was embolized with three, four, and 5 mm fibroid embolization coils to cessation of flow.  Follow-up arteriography demonstrates no further flow through the treated segment.  Continued good flow into the common hepatic artery.  No apparent complication.  After  confirmatory femoral arteriography, the sheath was removed and hemostasis achieved with the Exoseal device. The patient tolerated the procedure well.  No immediate complication  IMPRESSION:  1.  Technically successful coil embolization of the gastroduodenal artery to control the upper GI bleed.  Original Report Authenticated By: Osa Craver, M.D.   Ir Transcath/emboliz  02/10/2011  *RADIOLOGY REPORT*  Clinical Data: Upper GI bleed secondary to inflammatory duodenal/pancreatic lesion.  SELECTIVE VISCERAL ARTERIOGRAPHY,ULTRASOUND VENOUS ACCESS,ADDITIONAL ARTERIOGRAPHY,ARTERIOGRAPHY,TRANSCATHETER THERAPY EMBOLIZATION  Comparison: 01/27/2011  Technique and findings:  The procedure, risks (including but not limited to bleeding, infection, organ damage), benefits, and alternatives were explained to the patient.  Questions regarding the procedure were encouraged and answered.  The patient understands and consents to the procedure. The right femoral region prepped and draped usual sterile fashion. Maximal barrier sterile technique was utilized including caps, mask, sterile gowns, sterile gloves, sterile drape, hand hygiene and skin antiseptic.  Intravenous Fentanyl and Versed were administered as conscious sedation during continuous cardiorespiratory monitoring by the radiology RN, with a total moderate sedation time of 32 minutes.  Under real time ultrasound guidance, the right common femoral artery was accessed with   21-gauge micropuncture needle using single-wall technique in a single pass.  Ultrasound documentation was stored.  The needle was exchanged over a 0.018-inch guide wire for a transitional dilator, which allowed passage of a Teena Dunk guide wire into the abdominal aorta. Over this, a 5-French vascular sheath was placed.  Through this, 5-French C2 catheter was advanced and used to selectively catheterize the celiac axis for arteriography.  The gastroduodenal artery was identified.  With an angled  glide wire, the G D A was catheterized.  Selective arteriography confirmed appropriate catheter position. Parenchymal blush was evident with  no overt active extravasation or other focal arterial lesion.  The G D A was embolized with three, four, and 5 mm fibroid embolization coils to cessation of flow.  Follow-up arteriography demonstrates no further flow through the treated segment.  Continued good flow into the common hepatic artery.  No apparent complication.  After confirmatory femoral arteriography, the sheath was removed and hemostasis achieved with the Exoseal device. The patient tolerated the procedure well.  No immediate complication  IMPRESSION:  1.  Technically successful coil embolization of the gastroduodenal artery to control the upper GI bleed.  Original Report Authenticated By: Osa Craver, M.D.   Ir Angiogram Follow Up Study  02/10/2011  *RADIOLOGY REPORT*  Clinical Data: Upper GI bleed secondary to inflammatory duodenal/pancreatic lesion.  SELECTIVE VISCERAL ARTERIOGRAPHY,ULTRASOUND VENOUS ACCESS,ADDITIONAL ARTERIOGRAPHY,ARTERIOGRAPHY,TRANSCATHETER THERAPY EMBOLIZATION  Comparison: 01/27/2011  Technique and findings:  The procedure, risks (including but not limited to bleeding, infection, organ damage), benefits, and alternatives were explained to the patient.  Questions regarding the procedure were encouraged and answered.  The patient understands and consents to the procedure. The right femoral region prepped and draped usual sterile fashion. Maximal barrier sterile technique was utilized including caps, mask, sterile gowns, sterile gloves, sterile drape, hand hygiene and skin antiseptic.  Intravenous Fentanyl and Versed were administered as conscious sedation during continuous cardiorespiratory monitoring by the radiology RN, with a total moderate sedation time of 32 minutes.  Under real time ultrasound guidance, the right common femoral artery was accessed with   21-gauge  micropuncture needle using single-wall technique in a single pass.  Ultrasound documentation was stored.  The needle was exchanged over a 0.018-inch guide wire for a transitional dilator, which allowed passage of a Teena Dunk guide wire into the abdominal aorta. Over this, a 5-French vascular sheath was placed.  Through this, 5-French C2 catheter was advanced and used to selectively catheterize the celiac axis for arteriography.  The gastroduodenal artery was identified.  With an angled glide wire, the G D A was catheterized.  Selective arteriography confirmed appropriate catheter position. Parenchymal blush was evident with no overt active extravasation or other focal arterial lesion.  The G D A was embolized with three, four, and 5 mm fibroid embolization coils to cessation of flow.  Follow-up arteriography demonstrates no further flow through the treated segment.  Continued good flow into the common hepatic artery.  No apparent complication.  After confirmatory femoral arteriography, the sheath was removed and hemostasis achieved with the Exoseal device. The patient tolerated the procedure well.  No immediate complication  IMPRESSION:  1.  Technically successful coil embolization of the gastroduodenal artery to control the upper GI bleed.  Original Report Authenticated By: Osa Craver, M.D.   Ir US Guide Vasc Access Right  02/10/2011  *RADIOLOGY REPORT*  Clinical Data: Upper GI bleed secondary to inflammatory duodenal/pancreatic lesion.  SELECTIVE VISCERAL ARTERIOGRAPHY,ULTRASOUND VENOUS ACCESS,ADDITIONAL ARTERIOGRAPHY,ARTERIOGRAPHY,TRANSCATHETER THERAPY EMBOLIZATION  Comparison: 01/27/2011  Technique and findings:  The procedure, risks (including but not limited to bleeding, infection, organ damage), benefits, and alternatives were explained to the patient.  Questions regarding the procedure were encouraged and answered.  The patient understands and consents to the procedure. The right femoral region  prepped and draped usual sterile fashion. Maximal barrier sterile technique was utilized including caps, mask, sterile gowns, sterile gloves, sterile drape, hand hygiene and skin antiseptic.  Intravenous Fentanyl and Versed were administered as conscious sedation during continuous cardiorespiratory monitoring by the radiology RN, with a total moderate sedation time of  32 minutes.  Under real time ultrasound guidance, the right common femoral artery was accessed with   21-gauge micropuncture needle using single-wall technique in a single pass.  Ultrasound documentation was stored.  The needle was exchanged over a 0.018-inch guide wire for a transitional dilator, which allowed passage of a Teena Dunk guide wire into the abdominal aorta. Over this, a 5-French vascular sheath was placed.  Through this, 5-French C2 catheter was advanced and used to selectively catheterize the celiac axis for arteriography.  The gastroduodenal artery was identified.  With an angled glide wire, the G D A was catheterized.  Selective arteriography confirmed appropriate catheter position. Parenchymal blush was evident with no overt active extravasation or other focal arterial lesion.  The G D A was embolized with three, four, and 5 mm fibroid embolization coils to cessation of flow.  Follow-up arteriography demonstrates no further flow through the treated segment.  Continued good flow into the common hepatic artery.  No apparent complication.  After confirmatory femoral arteriography, the sheath was removed and hemostasis achieved with the Exoseal device. The patient tolerated the procedure well.  No immediate complication  IMPRESSION:  1.  Technically successful coil embolization of the gastroduodenal artery to control the upper GI bleed.  Original Report Authenticated By: Osa Craver, M.D.   Dg Chest Portable 1 View  02/10/2011  *RADIOLOGY REPORT*  Clinical Data: C E L placement  PORTABLE CHEST - 1 VIEW  Comparison: 01/26/2011   Findings: Interval placement of left central venous catheter with tip over the upper SVC region.  No pneumothorax.  Shallow inspiration with interval development of atelectasis in the left lung base.  Borderline heart size with normal pulmonary vascularity.  No blunting of costophrenic angles.  Tortuous aorta.  IMPRESSION: Left central venous catheter placed with tip to the SVC region.  No pneumothorax.  Atelectasis developing in the left lung base.  Original Report Authenticated By: Marlon Pel, M.D.    Anti-infectives: Anti-infectives     Start     Dose/Rate Route Frequency Ordered Stop   02/08/11 1000   vancomycin (VANCOCIN) IVPB 1000 mg/200 mL premix  Status:  Discontinued        1,000 mg 200 mL/hr over 60 Minutes Intravenous Every 12 hours 02/08/11 0810 02/11/11 1102   02/08/11 0800   levofloxacin (LEVAQUIN) IVPB 500 mg  Status:  Discontinued        500 mg 100 mL/hr over 60 Minutes Intravenous Every 24 hours 02/08/11 0756 02/11/11 1102          Assessment/Plan: s/p Procedure(s): EXPLORATORY LAPAROTOMY duodenal/panc mass (inflammatory).  Hg stable but still low.  Rec giving another transfusion today now that the area has been emobolized.  Hopefully the bleeding from the inflammatory mass has been temporized.  COntinue PO as tolerated.  LOS: 16 days    Lovenia Debruler C 02/11/2011

## 2011-02-11 NOTE — Progress Notes (Signed)
S/p coiling of gastroduodenal artery yesterday.  Subjective: No vomiting.  No dyspnea. Pain ok.  Per RN, had BM yesterday.  Objective: Vital signs in last 24 hours: Filed Vitals:   02/11/11 0600 02/11/11 0622 02/11/11 0700 02/11/11 0800  BP: 116/69  122/69 134/68  Pulse:    94  Temp:   98.8 F (37.1 C)   TempSrc:   Oral   Resp: 15 15 16 23   Height:      Weight:      SpO2:  96%  95%   Weight change: 0.1 kg (3.5 oz)  Intake/Output Summary (Last 24 hours) at 02/11/11 1030 Last data filed at 02/11/11 0837  Gross per 24 hour  Intake 1831.25 ml  Output   2525 ml  Net -693.75 ml   General appearance: Awake slightly drowsy. Comfotable. Lungs: clear to auscultation bilaterally Heart:: S1-S2 with mild tachycardia and soft systolic murmur. Abdomen: soft; nondistendede, minimal incisional erythema, no drainage; hypoactive bowel sounds; mildly  tender. Extremities: Trace of pedal edema. CV:  RRR w/o MGR Lab Results: Basic Metabolic Panel:  Basename 02/11/11 0539 02/10/11 0854  NA 135 130*  K 3.6 4.1  CL 101 96  CO2 25 24  GLUCOSE 142* 157*  BUN 25* 34*  CREATININE 0.63 0.87  CALCIUM 8.2* 8.0*  MG -- --  PHOS -- --    Basename 02/11/11 0556 02/11/11 0250  WBC 8.0 7.9  NEUTROABS -- --  HGB 7.3* 7.4*  HCT 21.1* 21.4*  MCV 87.2 85.9  PLT 152 143*    Basename 02/11/11 0743 02/11/11 0321 02/10/11 2352 02/10/11 2000 02/10/11 1612 02/10/11 1020  GLUCAP 139* 147* 148* 163* 162* 146*   Ir Angiogram Visceral Selective  02/10/2011  *RADIOLOGY REPORT*  Clinical Data: Upper GI bleed secondary to inflammatory duodenal/pancreatic lesion.  SELECTIVE VISCERAL ARTERIOGRAPHY,ULTRASOUND VENOUS ACCESS,ADDITIONAL ARTERIOGRAPHY,ARTERIOGRAPHY,TRANSCATHETER THERAPY EMBOLIZATION  Comparison: 01/27/2011  Technique and findings:  The procedure, risks (including but not limited to bleeding, infection, organ damage), benefits, and alternatives were explained to the patient.  Questions regarding  the procedure were encouraged and answered.  The patient understands and consents to the procedure. The right femoral region prepped and draped usual sterile fashion. Maximal barrier sterile technique was utilized including caps, mask, sterile gowns, sterile gloves, sterile drape, hand hygiene and skin antiseptic.  Intravenous Fentanyl and Versed were administered as conscious sedation during continuous cardiorespiratory monitoring by the radiology RN, with a total moderate sedation time of 32 minutes.  Under real time ultrasound guidance, the right common femoral artery was accessed with   21-gauge micropuncture needle using single-wall technique in a single pass.  Ultrasound documentation was stored.  The needle was exchanged over a 0.018-inch guide wire for a transitional dilator, which allowed passage of a Teena Dunk guide wire into the abdominal aorta. Over this, a 5-French vascular sheath was placed.  Through this, 5-French C2 catheter was advanced and used to selectively catheterize the celiac axis for arteriography.  The gastroduodenal artery was identified.  With an angled glide wire, the G D A was catheterized.  Selective arteriography confirmed appropriate catheter position. Parenchymal blush was evident with no overt active extravasation or other focal arterial lesion.  The G D A was embolized with three, four, and 5 mm fibroid embolization coils to cessation of flow.  Follow-up arteriography demonstrates no further flow through the treated segment.  Continued good flow into the common hepatic artery.  No apparent complication.  After confirmatory femoral arteriography, the sheath was removed and hemostasis achieved  with the Exoseal device. The patient tolerated the procedure well.  No immediate complication  IMPRESSION:  1.  Technically successful coil embolization of the gastroduodenal artery to control the upper GI bleed.  Original Report Authenticated By: Osa Craver, M.D.   Ir Angiogram  Selective Each Additional Vessel  02/10/2011  *RADIOLOGY REPORT*  Clinical Data: Upper GI bleed secondary to inflammatory duodenal/pancreatic lesion.  SELECTIVE VISCERAL ARTERIOGRAPHY,ULTRASOUND VENOUS ACCESS,ADDITIONAL ARTERIOGRAPHY,ARTERIOGRAPHY,TRANSCATHETER THERAPY EMBOLIZATION  Comparison: 01/27/2011  Technique and findings:  The procedure, risks (including but not limited to bleeding, infection, organ damage), benefits, and alternatives were explained to the patient.  Questions regarding the procedure were encouraged and answered.  The patient understands and consents to the procedure. The right femoral region prepped and draped usual sterile fashion. Maximal barrier sterile technique was utilized including caps, mask, sterile gowns, sterile gloves, sterile drape, hand hygiene and skin antiseptic.  Intravenous Fentanyl and Versed were administered as conscious sedation during continuous cardiorespiratory monitoring by the radiology RN, with a total moderate sedation time of 32 minutes.  Under real time ultrasound guidance, the right common femoral artery was accessed with   21-gauge micropuncture needle using single-wall technique in a single pass.  Ultrasound documentation was stored.  The needle was exchanged over a 0.018-inch guide wire for a transitional dilator, which allowed passage of a Teena Dunk guide wire into the abdominal aorta. Over this, a 5-French vascular sheath was placed.  Through this, 5-French C2 catheter was advanced and used to selectively catheterize the celiac axis for arteriography.  The gastroduodenal artery was identified.  With an angled glide wire, the G D A was catheterized.  Selective arteriography confirmed appropriate catheter position. Parenchymal blush was evident with no overt active extravasation or other focal arterial lesion.  The G D A was embolized with three, four, and 5 mm fibroid embolization coils to cessation of flow.  Follow-up arteriography demonstrates no further  flow through the treated segment.  Continued good flow into the common hepatic artery.  No apparent complication.  After confirmatory femoral arteriography, the sheath was removed and hemostasis achieved with the Exoseal device. The patient tolerated the procedure well.  No immediate complication  IMPRESSION:  1.  Technically successful coil embolization of the gastroduodenal artery to control the upper GI bleed.  Original Report Authenticated By: Osa Craver, M.D.   Ir Transcath/emboliz  02/10/2011  *RADIOLOGY REPORT*  Clinical Data: Upper GI bleed secondary to inflammatory duodenal/pancreatic lesion.  SELECTIVE VISCERAL ARTERIOGRAPHY,ULTRASOUND VENOUS ACCESS,ADDITIONAL ARTERIOGRAPHY,ARTERIOGRAPHY,TRANSCATHETER THERAPY EMBOLIZATION  Comparison: 01/27/2011  Technique and findings:  The procedure, risks (including but not limited to bleeding, infection, organ damage), benefits, and alternatives were explained to the patient.  Questions regarding the procedure were encouraged and answered.  The patient understands and consents to the procedure. The right femoral region prepped and draped usual sterile fashion. Maximal barrier sterile technique was utilized including caps, mask, sterile gowns, sterile gloves, sterile drape, hand hygiene and skin antiseptic.  Intravenous Fentanyl and Versed were administered as conscious sedation during continuous cardiorespiratory monitoring by the radiology RN, with a total moderate sedation time of 32 minutes.  Under real time ultrasound guidance, the right common femoral artery was accessed with   21-gauge micropuncture needle using single-wall technique in a single pass.  Ultrasound documentation was stored.  The needle was exchanged over a 0.018-inch guide wire for a transitional dilator, which allowed passage of a Teena Dunk guide wire into the abdominal aorta. Over this, a 5-French vascular sheath was placed.  Through this, 5-French C2 catheter was advanced and used to  selectively catheterize the celiac axis for arteriography.  The gastroduodenal artery was identified.  With an angled glide wire, the G D A was catheterized.  Selective arteriography confirmed appropriate catheter position. Parenchymal blush was evident with no overt active extravasation or other focal arterial lesion.  The G D A was embolized with three, four, and 5 mm fibroid embolization coils to cessation of flow.  Follow-up arteriography demonstrates no further flow through the treated segment.  Continued good flow into the common hepatic artery.  No apparent complication.  After confirmatory femoral arteriography, the sheath was removed and hemostasis achieved with the Exoseal device. The patient tolerated the procedure well.  No immediate complication  IMPRESSION:  1.  Technically successful coil embolization of the gastroduodenal artery to control the upper GI bleed.  Original Report Authenticated By: Osa Craver, M.D.   Ir Angiogram Follow Up Study  02/10/2011  *RADIOLOGY REPORT*  Clinical Data: Upper GI bleed secondary to inflammatory duodenal/pancreatic lesion.  SELECTIVE VISCERAL ARTERIOGRAPHY,ULTRASOUND VENOUS ACCESS,ADDITIONAL ARTERIOGRAPHY,ARTERIOGRAPHY,TRANSCATHETER THERAPY EMBOLIZATION  Comparison: 01/27/2011  Technique and findings:  The procedure, risks (including but not limited to bleeding, infection, organ damage), benefits, and alternatives were explained to the patient.  Questions regarding the procedure were encouraged and answered.  The patient understands and consents to the procedure. The right femoral region prepped and draped usual sterile fashion. Maximal barrier sterile technique was utilized including caps, mask, sterile gowns, sterile gloves, sterile drape, hand hygiene and skin antiseptic.  Intravenous Fentanyl and Versed were administered as conscious sedation during continuous cardiorespiratory monitoring by the radiology RN, with a total moderate sedation time of 32  minutes.  Under real time ultrasound guidance, the right common femoral artery was accessed with   21-gauge micropuncture needle using single-wall technique in a single pass.  Ultrasound documentation was stored.  The needle was exchanged over a 0.018-inch guide wire for a transitional dilator, which allowed passage of a Teena Dunk guide wire into the abdominal aorta. Over this, a 5-French vascular sheath was placed.  Through this, 5-French C2 catheter was advanced and used to selectively catheterize the celiac axis for arteriography.  The gastroduodenal artery was identified.  With an angled glide wire, the G D A was catheterized.  Selective arteriography confirmed appropriate catheter position. Parenchymal blush was evident with no overt active extravasation or other focal arterial lesion.  The G D A was embolized with three, four, and 5 mm fibroid embolization coils to cessation of flow.  Follow-up arteriography demonstrates no further flow through the treated segment.  Continued good flow into the common hepatic artery.  No apparent complication.  After confirmatory femoral arteriography, the sheath was removed and hemostasis achieved with the Exoseal device. The patient tolerated the procedure well.  No immediate complication  IMPRESSION:  1.  Technically successful coil embolization of the gastroduodenal artery to control the upper GI bleed.  Original Report Authenticated By: Osa Craver, M.D.   Ir US Guide Vasc Access Right  02/10/2011  *RADIOLOGY REPORT*  Clinical Data: Upper GI bleed secondary to inflammatory duodenal/pancreatic lesion.  SELECTIVE VISCERAL ARTERIOGRAPHY,ULTRASOUND VENOUS ACCESS,ADDITIONAL ARTERIOGRAPHY,ARTERIOGRAPHY,TRANSCATHETER THERAPY EMBOLIZATION  Comparison: 01/27/2011  Technique and findings:  The procedure, risks (including but not limited to bleeding, infection, organ damage), benefits, and alternatives were explained to the patient.  Questions regarding the procedure were  encouraged and answered.  The patient understands and consents to the procedure. The right femoral region prepped and draped  usual sterile fashion. Maximal barrier sterile technique was utilized including caps, mask, sterile gowns, sterile gloves, sterile drape, hand hygiene and skin antiseptic.  Intravenous Fentanyl and Versed were administered as conscious sedation during continuous cardiorespiratory monitoring by the radiology RN, with a total moderate sedation time of 32 minutes.  Under real time ultrasound guidance, the right common femoral artery was accessed with   21-gauge micropuncture needle using single-wall technique in a single pass.  Ultrasound documentation was stored.  The needle was exchanged over a 0.018-inch guide wire for a transitional dilator, which allowed passage of a Teena Dunk guide wire into the abdominal aorta. Over this, a 5-French vascular sheath was placed.  Through this, 5-French C2 catheter was advanced and used to selectively catheterize the celiac axis for arteriography.  The gastroduodenal artery was identified.  With an angled glide wire, the G D A was catheterized.  Selective arteriography confirmed appropriate catheter position. Parenchymal blush was evident with no overt active extravasation or other focal arterial lesion.  The G D A was embolized with three, four, and 5 mm fibroid embolization coils to cessation of flow.  Follow-up arteriography demonstrates no further flow through the treated segment.  Continued good flow into the common hepatic artery.  No apparent complication.  After confirmatory femoral arteriography, the sheath was removed and hemostasis achieved with the Exoseal device. The patient tolerated the procedure well.  No immediate complication  IMPRESSION:  1.  Technically successful coil embolization of the gastroduodenal artery to control the upper GI bleed.  Original Report Authenticated By: Osa Craver, M.D.   Dg Chest Portable 1  View  02/10/2011  *RADIOLOGY REPORT*  Clinical Data: C E L placement  PORTABLE CHEST - 1 VIEW  Comparison: 01/26/2011  Findings: Interval placement of left central venous catheter with tip over the upper SVC region.  No pneumothorax.  Shallow inspiration with interval development of atelectasis in the left lung base.  Borderline heart size with normal pulmonary vascularity.  No blunting of costophrenic angles.  Tortuous aorta.  IMPRESSION: Left central venous catheter placed with tip to the SVC region.  No pneumothorax.  Atelectasis developing in the left lung base.  Original Report Authenticated By: Marlon Pel, M.D.   Scheduled Meds:    . HYDROmorphone PCA 0.3 mg/mL   Intravenous Q4H  . HYDROmorphone PCA 0.3 mg/mL      . insulin aspart  0-3 Units Subcutaneous Q4H  . levofloxacin (LEVAQUIN) IV  500 mg Intravenous Q24H  . pantoprazole (PROTONIX) IV  40 mg Intravenous Q12H  . sodium chloride  10 mL Intravenous Q12H  . sodium chloride      . sodium chloride      . vancomycin  1,000 mg Intravenous Q12H  . DISCONTD: sodium chloride       Continuous Infusions:    . dextrose 5 % and 0.9 % NaCl with KCl 20 mEq/L 75 mL/hr at 02/11/11 0800   PRN Meds:.acetaminophen, bisacodyl, diphenhydrAMINE, diphenhydrAMINE, fentaNYL, HYDROmorphone, HYDROmorphone, naloxone, ondansetron (ZOFRAN) IV, ondansetron (ZOFRAN) IV, sodium chloride, sodium chloride, sodium phosphate Assessment/Plan: Patient Active Hospital Problem List: Hematemesis/GI Blood Loss secondary to Pancreatic/Gastric/Duodenal Mass and Hypotension S/p gastroduodenal artery coiling.  Hgb stablilized and BUN decreased.  Change to SDU.  Continue serial h/h.    Shortness of breath and sinus tachycardia. Resolved. CT angiogram of her chest was negative for PE. Her EKG revealed sinus tachycardia with no other acute changes. Cardiac enzymes were within normal limits. BNP was within normal limits. Started  low-dose prophylactic Lovenox. Start  sips  Abdominal Wall Cellulitis, improved. D/c abx  S/p ex lap: Pancreatic /Ulcerated Gastric /Duodenal Inflammatory Mass .  Cytology of needle aspirate of pancreatic mass is negative.   H. pylori infection. Eventual treatment  Abdominal distention improved. Start clears.  If fails again, needs TPN   hyponatremia, resolved. Acute renal failure. Resolved with IV fluids.  Post op leukocytosis. Resolved   Acute blood loss Anemia (01/26/2011):   The patient was transfused 2 units of packed red blood cells yesterday. She was transfused at least 3-4 units earlier during the hospitalization. Protonix was restarted empirically yesterday. We will discontinue Lovenox and that was started yesterday for prophylaxis.   Type 2 DM (09/13/2007):  Stable    Hypertension (09/13/2007):  Stable.    Syncope and collapse (01/26/2011) Hepatic steatosis (01/28/2011) Insomnia:  trazadone prn   Appreciate consultants   LOS: 16 days   Khyli Swaim L 02/11/2011, 10:30 AM

## 2011-02-11 NOTE — Progress Notes (Signed)
ANTIBIOTIC CONSULT NOTE   Pharmacy Consult for Vancomycin  Indication: cellulitis  Patient Measurements: Height: 5' 6.5" (168.9 cm) Weight: 204 lb 2.3 oz (92.6 kg) IBW/kg (Calculated) : 60.45   Vital Signs: Temp: 98.8 F (37.1 C) (08/15 0700) Temp src: Oral (08/15 0700) BP: 134/68 mmHg (08/15 0800) Pulse Rate: 94  (08/15 0800) Intake/Output from previous day: 08/14 0701 - 08/15 0700 In: 1656.3 [I.V.:1656.3] Out: 3375 [Urine:3375]  Labs:  Basename 02/11/11 0556 02/11/11 0539 02/11/11 0250 02/10/11 2050 02/10/11 0854 02/09/11 0446  WBC 8.0 -- 7.9 8.4 -- --  HGB 7.3* -- 7.4* 7.8* -- --  PLT 152 -- 143* 161 -- --  LABCREA -- -- -- -- -- --  CREATININE -- 0.63 -- -- 0.87 0.60  CRCLEARANCE -- -- -- -- -- --   RECENT VANCOMYCIN TROUGH LEVELS: Recent Labs  [address] [facility] 02/10/11 0855   VANCOTROUGH 14.7    Microbiology: Recent Results (from the past 720 hour(s))  SURGICAL PCR SCREEN     Status: Normal   Collection Time   01/23/11 11:30 AM      Component Value Range Status Comment   MRSA, PCR NEGATIVE  NEGATIVE  Final    Staphylococcus aureus NEGATIVE  NEGATIVE  Final   URINE CULTURE     Status: Normal   Collection Time   02/08/11 10:01 AM      Component Value Range Status Comment   Specimen Description URINE, CLEAN CATCH   Final    Special Requests NONE   Final    Setup Time [id number]   Final    Colony Count 60,000 COLONIES/ML   Final    Culture     Final    Value: Multiple bacterial morphotypes present, none predominant. Suggest appropriate recollection if clinically indicated.   Report Status 02/09/2011 FINAL   Final   CULTURE, BLOOD (ROUTINE X 2)     Status: Normal (Preliminary result)   Collection Time   02/09/11  6:15 PM      Component Value Range Status Comment   Specimen Description BLOOD LEFT HAND   Final    Special Requests BOTTLES DRAWN AEROBIC AND ANAEROBIC 4CC EACH   Final    Culture NO GROWTH 2 DAYS   Final    Report Status PENDING   Incomplete     CULTURE, BLOOD (ROUTINE X 2)     Status: Normal (Preliminary result)   Collection Time   02/09/11  6:25 PM      Component Value Range Status Comment   Specimen Description BLOOD SITE NOT SPECIFIED   Final    Special Requests BOTTLES DRAWN AEROBIC ONLY 6CC   Final    Culture NO GROWTH 2 DAYS   Final    Report Status PENDING   Incomplete   MRSA PCR SCREENING     Status: Normal   Collection Time   02/10/11 12:47 AM      Component Value Range Status Comment   MRSA by PCR NEGATIVE  NEGATIVE  Final     Medical History: Past Medical History  Diagnosis Date  . Hypertension   . Reflux   . Right elbow pain     OTIF  . Dementia   . Depression   . Osteoarthritis   . Pancreatitis 11/2007    HOP  . Angiomyolipoma of kidney     right  . Ulcerative esophagitis 12/05/2007    hx elevated gastrin, severe on EGD by Dr Jena Gauss , h pylori negative  . Hiatal hernia   .  S/P colonoscopy 2009    pt reports normal by Dr Lovell Sheehan  . Diabetes mellitus   . Anemia    Assessment: Good renal fxn (SCr stable) Vanco trough on target.  Goal of Therapy:  Vancomycin trough level 10-15 mcg/ml  Plan:  Continue Vancomycin  Margo Aye, Khalin Royce A 02/11/2011,10:29 AM

## 2011-02-12 LAB — TYPE AND SCREEN
Unit division: 0
Unit division: 0
Unit division: 0
Unit division: 0
Unit division: 0

## 2011-02-12 LAB — GLUCOSE, CAPILLARY
Glucose-Capillary: 163 mg/dL — ABNORMAL HIGH (ref 70–99)
Glucose-Capillary: 139 mg/dL — ABNORMAL HIGH (ref 70–99)

## 2011-02-12 LAB — BASIC METABOLIC PANEL
BUN: 13 mg/dL (ref 6–23)
CO2: 25 mEq/L (ref 19–32)
Calcium: 8.6 mg/dL (ref 8.4–10.5)
GFR calc non Af Amer: >60 mL/min (ref >60)
Glucose, Bld: 159 mg/dL — ABNORMAL HIGH (ref 70–99)

## 2011-02-12 MED ORDER — HYDROMORPHONE 0.3 MG/ML IV SOLN
INTRAVENOUS | Status: AC
  Filled 2011-02-12: qty 25

## 2011-02-12 MED ORDER — HYDROMORPHONE 0.3 MG/ML IV SOLN
INTRAVENOUS | Status: AC
  Administered 2011-02-12: 0.03 mL via INTRAVENOUS
  Filled 2011-02-12: qty 25

## 2011-02-12 MED ORDER — HYDROCODONE-ACETAMINOPHEN 5-325 MG PO TABS
1.0000 | ORAL_TABLET | ORAL | Status: DC | PRN
  Administered 2011-02-12 – 2011-02-13 (×3): 2 via ORAL
  Administered 2011-02-13: 1 via ORAL
  Administered 2011-02-13: 2 via ORAL
  Administered 2011-02-13: 1 via ORAL
  Administered 2011-02-13: 2 via ORAL
  Administered 2011-02-14 (×4): 1 via ORAL
  Administered 2011-02-15 – 2011-02-16 (×5): 2 via ORAL
  Administered 2011-02-16 (×2): 1 via ORAL
  Administered 2011-02-17 – 2011-02-18 (×10): 2 via ORAL
  Administered 2011-02-19: 1 via ORAL
  Administered 2011-02-19: 2 via ORAL
  Administered 2011-02-19: 1 via ORAL
  Administered 2011-02-19 – 2011-02-20 (×3): 2 via ORAL
  Filled 2011-02-12 (×2): qty 2
  Filled 2011-02-12: qty 1
  Filled 2011-02-12: qty 2
  Filled 2011-02-12: qty 1
  Filled 2011-02-12 (×2): qty 2
  Filled 2011-02-12 (×2): qty 1
  Filled 2011-02-12 (×4): qty 2
  Filled 2011-02-12: qty 1
  Filled 2011-02-12 (×2): qty 2
  Filled 2011-02-12: qty 1
  Filled 2011-02-12 (×6): qty 2
  Filled 2011-02-12: qty 1
  Filled 2011-02-12: qty 2
  Filled 2011-02-12: qty 1
  Filled 2011-02-12 (×7): qty 2
  Filled 2011-02-12: qty 1

## 2011-02-12 MED ORDER — SODIUM CHLORIDE 0.9 % IV SOLN
600.0000 mg | Freq: Two times a day (BID) | INTRAVENOUS | Status: DC
  Administered 2011-02-12 – 2011-02-20 (×17): 600 mg via INTRAVENOUS
  Filled 2011-02-12 (×23): qty 600

## 2011-02-12 NOTE — Progress Notes (Signed)
Patient oob to chair tolorating well  Abdomen swollen incision midline stables intact  Area around incision looks red painful to touch.  Pca dilaudid given

## 2011-02-12 NOTE — Progress Notes (Signed)
  Subjective: "I feel sick."  C/o worse pain.  Nauseated.  Objective: Vital signs in last 24 hours: Filed Vitals:   02/12/11 0500 02/12/11 0600 02/12/11 0700 02/12/11 0800  BP: 125/58 113/46 136/69   Pulse: 93     Temp:      TempSrc:      Resp: 19 16 18 16   Height:      Weight: 91.3 kg (201 lb 4.5 oz)     SpO2: 94%   97%   Weight change: -1.3 kg (-2 lb 13.9 oz)  Intake/Output Summary (Last 24 hours) at 02/12/11 0909 Last data filed at 02/12/11 0900  Gross per 24 hour  Intake 2852.5 ml  Output   1700 ml  Net 1152.5 ml   General appearance: Awake in chair. Uncomfortable Lungs: clear to auscultation bilaterally Heart:: S1-S2 with mild tachycardia and soft systolic murmur. Abdomen: soft; nondistendede, erythema around incision worse;  tender. Extremities: Trace of pedal edema. CV:  RRR w/o MGR Lab Results: Basic Metabolic Panel:  Basename 02/12/11 0433 02/11/11 0539  NA 133* 135  K 3.5 3.6  CL 98 101  CO2 25 25  GLUCOSE 159* 142*  BUN 13 25*  CREATININE 0.60 0.63  CALCIUM 8.6 8.2*  MG -- --  PHOS -- --    Basename 02/12/11 0433 02/11/11 2000 02/11/11 0556 02/11/11 0250  WBC -- -- 8.0 7.9  NEUTROABS -- -- -- --  HGB 9.8* 10.1* -- --  HCT 28.6* 28.5* -- --  MCV -- -- 87.2 85.9  PLT -- -- 152 143*    Basename 02/12/11 0726 02/12/11 0400 02/12/11 0006 02/11/11 1956 02/11/11 1554 02/11/11 1123  GLUCAP 137* 174* 151* 159* 143* 155*    Scheduled Meds:    . HYDROmorphone PCA 0.3 mg/mL   Intravenous Q4H  . HYDROmorphone PCA 0.3 mg/mL      . insulin aspart  0-3 Units Subcutaneous Q4H  . pantoprazole (PROTONIX) IV  40 mg Intravenous Q12H  . sodium chloride  10 mL Intravenous Q12H  . DISCONTD: HYDROmorphone PCA 0.3 mg/mL   Intravenous Q4H  . DISCONTD: HYDROmorphone PCA 0.3 mg/mL      . DISCONTD: levofloxacin (LEVAQUIN) IV  500 mg Intravenous Q24H  . DISCONTD: vancomycin  1,000 mg Intravenous Q12H   Continuous Infusions:    . dextrose 5 % and 0.9 % NaCl with  KCl 20 mEq/L 20 mL/hr at 02/11/11 1900   PRN Meds:.bisacodyl, diphenhydrAMINE, diphenhydrAMINE, naloxone, ondansetron (ZOFRAN) IV, sodium chloride, sodium phosphate, DISCONTD: acetaminophen, DISCONTD: diphenhydrAMINE, DISCONTD: diphenhydrAMINE, DISCONTD: fentaNYL, DISCONTD: HYDROmorphone, DISCONTD: HYDROmorphone, DISCONTD: naloxone, DISCONTD: ondansetron (ZOFRAN) IV, DISCONTD: sodium chloride Assessment/Plan: Patient Active Hospital Problem List: Hematemesis/GI Blood Loss secondary to Pancreatic/Gastric/Duodenal Mass and Hypotension S/p gastroduodenal artery coiling.  Hgb stablilized and BUN normalized.    Abdominal Wall Cellulitis, worse today. Resume abx  S/p ex lap: Pancreatic /Ulcerated Gastric /Duodenal Inflammatory Mass .  Cytology of needle aspirate of pancreatic mass is negative.   H. pylori infection. Eventual treatment  Abdominal distention improved. Start clears.  If fails again, needs TPN   Acute blood loss Anemia (01/26/2011):   Stable  Type 2 DM (09/13/2007):  Stable    Hypertension (09/13/2007):  Stable.    Syncope and collapse (01/26/2011) Hepatic steatosis (01/28/2011) Insomnia:  trazadone prn   Appreciate consultants   LOS: 17 days   [name] L 02/12/2011, 9:09 AM

## 2011-02-12 NOTE — Progress Notes (Signed)
8 Days Post-Op  Subjective: Pain controlled.  No new nausea.   No change BM.  Still some flatus.  Tolerating PO.  Objective: Vital signs in last 24 hours: Temp:  [98.1 F (36.7 C)-100 F (37.8 C)] 98.1 F (36.7 C) (08/16 1617) Pulse Rate:  [93-112] 103  (08/16 1100) Resp:  [15-25] 15  (08/16 1200) BP: (82-172)/(46-126) 121/93 mmHg (08/16 1200) SpO2:  [90 %-99 %] 99 % (08/16 1100) Weight:  [91.3 kg (201 lb 4.5 oz)] 201 lb 4.5 oz (91.3 kg) (08/16 0500) Last BM Date: 02/10/11  Intake/Output from previous day: 08/15 0701 - 08/16 0700 In: 2862.5 [P.O.:600; I.V.:1462.5; Blood:700; IV Piggyback:100] Out: 1700 [Urine:1700] Intake/Output this shift: I/O this shift: In: 240 [P.O.:240] Out: 350 [Urine:350]  General appearance: alert and no distress GI: +BS.  Soft mod expected tenderness.  Inc some errythema.  No d/c  Lab Results:  @LABLAST2 (wbc:2,hgb:2,hct:2,plt:2) BMET  Basename 02/12/11 0433 02/11/11 0539  NA 133* 135  K 3.5 3.6  CL 98 101  CO2 25 25  GLUCOSE 159* 142*  BUN 13 25*  CREATININE 0.60 0.63  CALCIUM 8.6 8.2*   PT/INR  Basename 02/09/11 2324  LABPROT 14.9  INR 1.15   ABG No results found for this basename: PHART:2,PCO2:2,PO2:2,HCO3:2 in the last 72 hours  Studies/Results: No results found.  Anti-infectives: Anti-infectives     Start     Dose/Rate Route Frequency Ordered Stop   02/08/11 1000   vancomycin (VANCOCIN) IVPB 1000 mg/200 mL premix  Status:  Discontinued        1,000 mg 200 mL/hr over 60 Minutes Intravenous Every 12 hours 02/08/11 0810 02/11/11 1102   02/08/11 0800   levofloxacin (LEVAQUIN) IVPB 500 mg  Status:  Discontinued        500 mg 100 mL/hr over 60 Minutes Intravenous Every 24 hours 02/08/11 0756 02/11/11 1102          Assessment/Plan: s/p Procedure(s): EXPLORATORY LAPAROTOMY Agree with resuming abx coverage.  Some cellulitis around inc but no evidence currently for abscess.  Hg has been stable from IR procedure.  Continue  to monitor.  Pt is not ideal candidate for definitive surgery ([facility]).  Awating decision from tertiary care.  LOS: 17 days    Chet Greenley C 02/12/2011

## 2011-02-12 NOTE — Progress Notes (Signed)
02/12/2011  Notified sw that pt will need snf if d/c from [facility] if not transferred to another hospital

## 2011-02-13 LAB — GLUCOSE, CAPILLARY

## 2011-02-13 LAB — CBC
Hemoglobin: 9.5 g/dL — ABNORMAL LOW (ref 12.0–15.0)
MCH: 29.9 pg (ref 26.0–34.0)
MCHC: 33.9 g/dL (ref 30.0–36.0)
MCV: 88.1 fL (ref 78.0–100.0)
RBC: 3.18 MIL/uL — ABNORMAL LOW (ref 3.87–5.11)

## 2011-02-13 MED ORDER — DARIFENACIN HYDROBROMIDE ER 7.5 MG PO TB24
7.5000 mg | ORAL_TABLET | Freq: Every day | ORAL | Status: DC
  Administered 2011-02-13: 7.5 mg via ORAL
  Filled 2011-02-13 (×4): qty 2

## 2011-02-13 MED ORDER — HYDROMORPHONE HCL 1 MG/ML IJ SOLN
1.0000 mg | INTRAMUSCULAR | Status: DC | PRN
  Administered 2011-02-13 – 2011-02-19 (×22): 1 mg via INTRAVENOUS
  Filled 2011-02-13 (×23): qty 1

## 2011-02-13 MED ORDER — DARIFENACIN HYDROBROMIDE ER 7.5 MG PO TB24
7.5000 mg | ORAL_TABLET | Freq: Every day | ORAL | Status: DC
  Administered 2011-02-14 – 2011-02-20 (×7): 7.5 mg via ORAL
  Filled 2011-02-13 (×10): qty 1

## 2011-02-13 NOTE — Progress Notes (Signed)
9 Days Post-Op  Subjective: No pain.  No sob.  No nausea.  +Bowel function.  No melena.  TOlerating PO.  Objective: Vital signs in last 24 hours: Temp:  [98.1 F (36.7 C)-100.4 F (38 C)] 99.1 F (37.3 C) (08/17 1200) Pulse Rate:  [77-99] 88  (08/17 1200) Resp:  [13-23] 19  (08/17 1200) BP: (129-178)/(53-114) 150/97 mmHg (08/17 1200) SpO2:  [93 %-100 %] 95 % (08/17 1200) Weight:  [97.5 kg (214 lb 15.2 oz)] 214 lb 15.2 oz (97.5 kg) (08/17 0500) Last BM Date: 02/10/11  Intake/Output from previous day: 08/16 0701 - 08/17 0700 In: 240 [P.O.:240] Out: 1750 [Urine:1750] Intake/Output this shift: I/O this shift: In: 120 [P.O.:120] Out: 600 [Urine:600]  General appearance: alert and no distress Resp: clear to auscultation bilaterally GI: +BS, soft, expected midline tenderness.  Inc c/d/i.  Slight errythema around edges.  Staples in place.  Lab Results:  @LABLAST2 (wbc:2,hgb:2,hct:2,plt:2) BMET  Basename 02/12/11 0433 02/11/11 0539  NA 133* 135  K 3.5 3.6  CL 98 101  CO2 25 25  GLUCOSE 159* 142*  BUN 13 25*  CREATININE 0.60 0.63  CALCIUM 8.6 8.2*   PT/INR No results found for this basename: LABPROT:2,INR:2 in the last 72 hours ABG No results found for this basename: PHART:2,PCO2:2,PO2:2,HCO3:2 in the last 72 hours  Studies/Results: No results found.  Anti-infectives: Anti-infectives     Start     Dose/Rate Route Frequency Ordered Stop   02/08/11 1000   vancomycin (VANCOCIN) IVPB 1000 mg/200 mL premix  Status:  Discontinued        1,000 mg 200 mL/hr over 60 Minutes Intravenous Every 12 hours 02/08/11 0810 02/11/11 1102   02/08/11 0800   levofloxacin (LEVAQUIN) IVPB 500 mg  Status:  Discontinued        500 mg 100 mL/hr over 60 Minutes Intravenous Every 24 hours 02/08/11 0756 02/11/11 1102          Assessment/Plan: s/p Procedure(s): EXPLORATORY LAPAROTOMY Stable.  Hg has not trended down much in the last 24hrs.  Feel the embolization has been successful.   WOuld watch her for the next 24hrs in ICU and if stable consider transfer to floor in AM.  If remains stable over the weekend and tolerates PO, possible d/c early in the week.  Possible out pt referral if even needed to [facility].  Obviously, if bleeding recurs, then definitve surgery would need to be arranged.  Given patient's age and reserves, this course is likely best option.  I have discussed this with patient and she is in agreement.  LOS: 18 days    [name] C 02/13/2011

## 2011-02-13 NOTE — Progress Notes (Signed)
FOLLOW-UP ADULT NUTRITION ASSESSMENT Date: 02/13/2011   Time: 2:08 PM Reason for Assessment: Incr nutritional risk  ASSESSMENT: Female 77 y.o.  Dx: Anemia  Hx:  Past Medical History  Diagnosis Date  . Hypertension   . Reflux   . Right elbow pain     OTIF  . Dementia   . Depression   . Osteoarthritis   . Pancreatitis 11/2007    HOP  . Angiomyolipoma of kidney     right  . Ulcerative esophagitis 12/05/2007    hx elevated gastrin, severe on EGD by Dr Jena Gauss , h pylori negative  . Hiatal hernia   . S/P colonoscopy 2009    pt reports normal by Dr Lovell Sheehan  . Diabetes mellitus   . Anemia     Scheduled Meds:    . ceftaroline (TEFLARO) 600 mg IVPB  600 mg Intravenous Q12H  . darifenacin  7.5 mg Oral Daily  . insulin aspart  0-3 Units Subcutaneous Q4H  . sodium chloride  10 mL Intravenous Q12H  . DISCONTD: darifenacin  7.5-15 mg Oral Daily  . DISCONTD: HYDROmorphone PCA 0.3 mg/mL   Intravenous Q4H  . DISCONTD: pantoprazole (PROTONIX) IV  40 mg Intravenous Q12H   Continuous Infusions:    . dextrose 5 % and 0.9 % NaCl with KCl 20 mEq/L 20 mL/hr at 02/11/11 1900   PRN Meds:.bisacodyl, diphenhydrAMINE, diphenhydrAMINE, HYDROcodone-acetaminophen, HYDROmorphone, naloxone, ondansetron (ZOFRAN) IV, sodium chloride, sodium phosphate  Ht: 5' 6.5" (168.9 cm)  Wt: 214 lb 15.2 oz (97.5 kg)  Ideal Wt: 60.45 kg 64.95 kg % Ideal Wt: 154 %  Usual Wt: 182 # % Usual Wt: 112 %  Body mass index is 34.17 kg/(m^2).  Wt hx  02/08/2011 8/1- 182# 8/2- 202# 2.6 oz 8/3- 205# 4 oz 8/4 196# 3.4 oz 8/6 197# 8.5 oz 8/7 215# 9.8 oz 8/8 175# 14.8 oz 8/11 203# 0.7 oz 8/12 198# 10.2 oz 8/13 203# 14.8 oz 8/15 204# 2.3 oz 8/16 201# 4.5 oz 8/17 214# 15.2 oz    Food/Nutrition Related Hx: Pt able to provide limited nutrition hx. Reports usual body wt 182#. C/o abdominal pain. Received full liquid diet x4d. Po intake 50%. Pt needs assist with meals. Current diet NPO. Significant wt gain noted  12% above UBW. Pt high risk for malnutrition given incr LOS and limited po intake.  02/13/2011 Pt awake and in chair. Pt reports 100% intake at home. Current intake is 10-35% per chart and pt. Pt tolerates bland diet and appetite improving but still fair. Pt feeds herself. Current wt is 214# incr 13# since yesterday (8/16 wt 201#); may be due to fluid. Overall wt has been fairly stable per wt hx noted above. Pt remains at nutr risk due to limited po intake and LOS.   Labs: Results for [name], [name] (MRN [id number]) as of 02/13/2011 14:10  Ref. Range 02/13/2011 00:10 02/13/2011 04:06 02/13/2011 04:21 02/13/2011 07:51 02/13/2011 11:35  Glucose-Capillary Latest Range: 70-99 mg/dL 782 (H) 956 (H)  213 (H) 127 (H)  WBC Latest Range: 4.0-10.5 K/uL   5.4    RBC Latest Range: 3.87-5.11 MIL/uL   3.18 (L)    HGB Latest Range: 12.0-15.0 g/dL   9.5 (L)    HCT Latest Range: 36.0-46.0 %   28.0 (L)    MCV Latest Range: 78.0-100.0 fL   88.1    MCH Latest Range: 26.0-34.0 pg   29.9    MCHC Latest Range: 30.0-36.0 g/dL   08.6    RDW Latest  Range: 11.5-15.5 %   14.9    Platelets Latest Range: 150-400 K/uL   156      Intake/Output Summary (Last 24 hours) at 02/13/11 1408 Last data filed at 02/13/11 1300  Gross per 24 hour  Intake   1110 ml  Output   2625 ml  Net  -1515 ml    Diet Order: Parke Simmers  Supplements/Tube Feeding: None at this time  IVF:     dextrose 5 % and 0.9 % NaCl with KCl 20 mEq/L Last Rate: 20 mL/hr at 02/11/11 1900    Estimated Nutritional Needs:   Kcal:1300-1400 Protein:106-118 grams  Fluid:1300-1400 mL  NUTRITION DIAGNOSIS:   -Inadequate oral intake (NI-2.1).  Status: Ongoing  RELATED TO: altered GI function  AS EVIDENCE BY: GI bleed and duodenal lesion/pancreatic lesion   MONITORING/EVALUATION(Goals): - Meet > 75% energy and protein needs. 02/13/2011 Goal not met.  - Obtain/maintain UBW range. 02/13/2011 Goal not met. - Will cont to monitor wt and po intake.   EDUCATION  NEEDS: - Education not appropriate at this time  INTERVENTION: - Will evaluate for appropriate suppl when diet is adv.   02/13/2011 Due to wt maintenance will not recommend supplement at this time.  Dietitian # [phone]  DOCUMENTATION CODES Per approved criteria  -Obesity Unspecified    Abe People 02/13/2011, 2:08 PM

## 2011-02-13 NOTE — Progress Notes (Signed)
  Subjective: Pain controlled. No nausea. Not eating much.  Objective: Vital signs in last 24 hours: Filed Vitals:   02/13/11 0400 02/13/11 0500 02/13/11 0600 02/13/11 0833  BP: 147/73 150/76 158/70   Pulse: 87 88 82   Temp: 99 F (37.2 C)     TempSrc: Oral     Resp: 16 16 15 13   Height:      Weight:  97.5 kg (214 lb 15.2 oz)    SpO2: 97% 93% 96% 97%   Weight change: 6.2 kg (13 lb 10.7 oz)  Intake/Output Summary (Last 24 hours) at 02/13/11 0834 Last data filed at 02/13/11 0500  Gross per 24 hour  Intake    240 ml  Output   1750 ml  Net  -1510 ml   General appearance: Awake. Groggy. Appears more comfortable than yesterday. Lungs: clear to auscultation bilaterally Heart:: S1-S2 with mild tachycardia and soft systolic murmur. Abdomen: soft; nondistended, erythema around incision improved.;  Appropriately tender. No drainage from incision. Extremities: Trace of pedal edema. CV:  RRR w/o MGR Lab Results: Basic Metabolic Panel:  Basename 02/12/11 0433 02/11/11 0539  NA 133* 135  K 3.5 3.6  CL 98 101  CO2 25 25  GLUCOSE 159* 142*  BUN 13 25*  CREATININE 0.60 0.63  CALCIUM 8.6 8.2*  MG -- --  PHOS -- --    Basename 02/13/11 0421 02/12/11 0433 02/11/11 0556  WBC 5.4 -- 8.0  NEUTROABS -- -- --  HGB 9.5* 9.8* --  HCT 28.0* 28.6* --  MCV 88.1 -- 87.2  PLT 156 -- 152    Basename 02/13/11 0751 02/13/11 0406 02/13/11 0010 02/12/11 1958 02/12/11 1607 02/12/11 1110  GLUCAP 130* 135* 122* 170* 139* 163*    Scheduled Meds:    . ceftaroline (TEFLARO) 600 mg IVPB  600 mg Intravenous Q12H  . HYDROmorphone PCA 0.3 mg/mL   Intravenous Q4H  . insulin aspart  0-3 Units Subcutaneous Q4H  . pantoprazole (PROTONIX) IV  40 mg Intravenous Q12H  . sodium chloride  10 mL Intravenous Q12H   Continuous Infusions:    . dextrose 5 % and 0.9 % NaCl with KCl 20 mEq/[name] 20 mL/hr at 02/11/11 1900   PRN Meds:.bisacodyl, diphenhydrAMINE, diphenhydrAMINE, HYDROcodone-acetaminophen,  naloxone, ondansetron (ZOFRAN) IV, sodium chloride, sodium phosphate Assessment/Plan: Patient Active Hospital Problem List: Hematemesis/GI Blood Loss secondary to Pancreatic/Gastric/Duodenal Mass Advance diet. Continued out of bed. Will get a physical therapy consult. According to the nurse, not using PCA appropriately. Will change to intermittent Dilaudid, and continue Vicodin as needed. I discussed the case with Dr. Dian Situ yesterday. Long-term plan will be deferred to him after discussions with [facility] surgeons.  Abdominal Wall Cellulitis, better. Continue sedentary.  H. pylori infection. Eventual treatment   Acute blood loss Anemia (01/26/2011):   Stable  Type 2 DM (09/13/2007):  Stable    Hypertension (09/13/2007):  Stable.     LOS: 18 days   [name] 02/13/2011, 8:34 AM

## 2011-02-13 NOTE — Progress Notes (Signed)
  Subjective: "I feel sick."  C/o worse pain.  Nauseated.  Objective: Vital signs in last 24 hours: Filed Vitals:   02/13/11 0358 02/13/11 0400 02/13/11 0500 02/13/11 0600  BP:  147/73 150/76 158/70  Pulse:  87 88 82  Temp:  99 F (37.2 C)    TempSrc:  Oral    Resp: 15 16 16 15   Height:      Weight:   97.5 kg (214 lb 15.2 oz)   SpO2: 96% 97% 93% 96%   Weight change: 6.2 kg (13 lb 10.7 oz)  Intake/Output Summary (Last 24 hours) at 02/13/11 0825 Last data filed at 02/13/11 0500  Gross per 24 hour  Intake    240 ml  Output   1750 ml  Net  -1510 ml   General appearance: Awake in chair. Uncomfortable Lungs: clear to auscultation bilaterally Heart:: S1-S2 with mild tachycardia and soft systolic murmur. Abdomen: soft; nondistendede, erythema around incision worse;  tender. Extremities: Trace of pedal edema. CV:  RRR w/o MGR Lab Results: Basic Metabolic Panel:  Basename 02/12/11 0433 02/11/11 0539  NA 133* 135  K 3.5 3.6  CL 98 101  CO2 25 25  GLUCOSE 159* 142*  BUN 13 25*  CREATININE 0.60 0.63  CALCIUM 8.6 8.2*  MG -- --  PHOS -- --    Basename 02/13/11 0421 02/12/11 0433 02/11/11 0556  WBC 5.4 -- 8.0  NEUTROABS -- -- --  HGB 9.5* 9.8* --  HCT 28.0* 28.6* --  MCV 88.1 -- 87.2  PLT 156 -- 152    Basename 02/13/11 0406 02/13/11 0010 02/12/11 1958 02/12/11 1607 02/12/11 1110 02/12/11 0726  GLUCAP 135* 122* 170* 139* 163* 137*    Scheduled Meds:    . ceftaroline (TEFLARO) 600 mg IVPB  600 mg Intravenous Q12H  . HYDROmorphone PCA 0.3 mg/mL   Intravenous Q4H  . insulin aspart  0-3 Units Subcutaneous Q4H  . pantoprazole (PROTONIX) IV  40 mg Intravenous Q12H  . sodium chloride  10 mL Intravenous Q12H   Continuous Infusions:    . dextrose 5 % and 0.9 % NaCl with KCl 20 mEq/L 20 mL/hr at 02/11/11 1900   PRN Meds:.bisacodyl, diphenhydrAMINE, diphenhydrAMINE, HYDROcodone-acetaminophen, naloxone, ondansetron (ZOFRAN) IV, sodium chloride, sodium  phosphate Assessment/Plan: Patient Active Hospital Problem List: Hematemesis/GI Blood Loss secondary to Pancreatic/Gastric/Duodenal Mass and Hypotension S/p gastroduodenal artery coiling.  Hgb stablilized and BUN normalized.    Abdominal Wall Cellulitis, worse today. Resume abx  S/p ex lap: Pancreatic /Ulcerated Gastric /Duodenal Inflammatory Mass .  Cytology of needle aspirate of pancreatic mass is negative.   H. pylori infection. Eventual treatment  Abdominal distention improved. Start clears.  If fails again, needs TPN   Acute blood loss Anemia (01/26/2011):   Stable  Type 2 DM (09/13/2007):  Stable    Hypertension (09/13/2007):  Stable.     LOS: 18 days   [name] L 02/13/2011, 8:25 AM

## 2011-02-13 NOTE — Progress Notes (Signed)
Physical Therapy Evaluation Patient Name: [name] [id number] Date: 02/13/2011 Problem List:  Patient Active Problem List  Diagnoses  . DIABETES MELLITUS, TYPE II  . HYPERLIPIDEMIA  . OBESITY, UNSPECIFIED  . DEPRESSION  . HYPERTENSION  . SINUSITIS, ACUTE  . ALLERGIC RHINITIS, SEASONAL  . OTHER CHRONIC BRONCHITIS  . DIVERTICULOSIS OF COLON  . ACUTE CYSTITIS  . INSOMNIA  . FATIGUE  . UNSTEADY GAIT  . HEADACHE  . OTHER URINARY INCONTINENCE  . CLOSED FRACTURE OF OLECRANON PROCESS OF ULNA  . HEMATOMA  . HEADACHES, HX OF  . Anorexia  . Anemia  . Rectal mass  . Syncope and collapse  . Hepatic steatosis  . Hyponatremia  . Gastric mass  . H pylori ulcer  . Ulcer, colon  . Acute renal failure  . S/P exploratory laparotomy  . Pericardial effusion  . Fever, postprocedural   Past Medical History:  Past Medical History  Diagnosis Date  . Hypertension   . Reflux   . Right elbow pain     OTIF  . Dementia   . Depression   . Osteoarthritis   . Pancreatitis 11/2007    HOP  . Angiomyolipoma of kidney     right  . Ulcerative esophagitis 12/05/2007    hx elevated gastrin, severe on EGD by Dr Jena Gauss , h pylori negative  . Hiatal hernia   . S/P colonoscopy 2009    pt reports normal by Dr Lovell Sheehan  . Diabetes mellitus   . Anemia    Past Surgical History:  Past Surgical History  Procedure Date  . Orif right hip 1999    [facility]  . Umbilical hernia repair 12 months old    [place]  . Esophagogastroduodenoscopy 01/28/2011    Procedure: ESOPHAGOGASTRODUODENOSCOPY (EGD);  Surgeon: Arlyce Harman, MD;  Location: [facility] ENDO SUITE;  Service: Endoscopy;  Laterality: N/A;  . Colonoscopy 01/28/2011    Procedure: COLONOSCOPY;  Surgeon: Arlyce Harman, MD;  Location: [facility] ENDO SUITE;  Service: Endoscopy;  Laterality: N/A;  . Laparotomy 02/04/2011    Procedure: EXPLORATORY LAPAROTOMY;  Surgeon: Fabio Bering;  Location: [facility] ORS;  Service: General;  Laterality: N/A;    Precautions/Restrictions    Precautions Required Braces or Orthoses: No Restrictions Weight Bearing Restrictions: No Prior Functioning  Home Living Type of Home: House Lives With: Alone Receives Help From: Personal care attendant Home Layout: One level Home Access: Stairs to enter Entrance Stairs-Rails: None Entrance Stairs-Number of Steps: 1 Bathroom Shower/Tub: Engineer, manufacturing systems: Standard Bathroom Accessibility: Yes How Accessible: Accessible via walker Home Adaptive Equipment: Walker - rolling;Straight cane Prior Function Level of Independence: Independent with basic ADLs;Needs assistance with homemaking;Requires assistive device for independence Driving: No Vocation: Retired Producer, television/film/video: Awake/alert Overall Cognitive Status: Appears within functional limits for tasks assessed Orientation Level: Oriented X4 Sensation/Coordination Sensation Light Touch: Appears Intact Proprioception: Appears Intact Coordination Gross Motor Movements are Fluid and Coordinated: Yes Fine Motor Movements are Fluid and Coordinated: Yes Extremity Assessment RUE Assessment RUE Assessment: Within Functional Limits LUE Assessment LUE Assessment: Within Functional Limits RLE Assessment RLE Assessment: Within Functional Limits LLE Assessment LLE Assessment: Within Functional Limits Mobility (including Balance) Bed Mobility Bed Mobility:  (independent) Transfers Transfers: Yes Sit to Stand: 6: Modified independent (Device/Increase time) Stand to Sit: 6: Modified independent (Device/Increase time) Ambulation/Gait Ambulation/Gait: Yes Ambulation/Gait Assistance: 6: Modified independent (Device/Increase time) Ambulation Distance (Feet): 12 Feet Assistive device: Rolling walker Gait Pattern: Trunk flexed;Decreased step length - right;Decreased step length - left Stairs:  No Wheelchair Mobility Wheelchair Mobility: No  Posture/Postural Control Posture/Postural Control: No  significant limitations Balance Balance Assessed:  (WNL) Exercise     End of Session PT - End of Session Equipment Utilized During Treatment: Gait belt Activity Tolerance: Patient limited by fatigue Patient left: in chair;with call bell in reach Nurse Communication: Mobility status for transfers;Mobility status for ambulation General Behavior During Session: [facility] for tasks performed Cognition: [facility] for tasks performed PT Assessment/Plan/Recommendation PT Assessment Clinical Impression Statement: Pt is deconditioned due to prolonged hospital stay-now recommend SNF at d/c as she lives alone-pt is reluctant to agree PT Recommendation/Assessment: Patient will need skilled PT in the acute care venue PT Problem List: Decreased activity tolerance;Decreased mobility;Cardiopulmonary status limiting activity Barriers to Discharge: Decreased caregiver support PT Therapy Diagnosis : Difficulty walking;Generalized weakness PT Plan PT Frequency: Min 3X/week PT Treatment/Interventions: Gait training;Functional mobility training;Therapeutic exercise PT Recommendation Recommendations for Other Services: OT consult Follow Up Recommendations: Skilled nursing facility Equipment Recommended: Defer to next venue PT Goals  Acute Rehab PT Goals PT Goal Formulation: With patient Time For Goal Achievement: 2 weeks Pt will Ambulate: 51 - 150 feet Myrlene Broker L 02/13/2011, 1:43 PM

## 2011-02-14 LAB — GLUCOSE, CAPILLARY
Glucose-Capillary: 127 mg/dL — ABNORMAL HIGH (ref 70–99)
Glucose-Capillary: 129 mg/dL — ABNORMAL HIGH (ref 70–99)
Glucose-Capillary: 130 mg/dL — ABNORMAL HIGH (ref 70–99)
Glucose-Capillary: 132 mg/dL — ABNORMAL HIGH (ref 70–99)

## 2011-02-14 LAB — BASIC METABOLIC PANEL
CO2: 30 mEq/L (ref 19–32)
Calcium: 8.3 mg/dL — ABNORMAL LOW (ref 8.4–10.5)
Chloride: 99 mEq/L (ref 96–112)
Potassium: 3.1 mEq/L — ABNORMAL LOW (ref 3.5–5.1)
Sodium: 135 mEq/L (ref 135–145)

## 2011-02-14 LAB — CULTURE, BLOOD (ROUTINE X 2): Culture: NO GROWTH

## 2011-02-14 LAB — CBC
Platelets: 189 10*3/uL (ref 150–400)
RBC: 3.35 MIL/uL — ABNORMAL LOW (ref 3.87–5.11)
WBC: 5.2 10*3/uL (ref 4.0–10.5)

## 2011-02-14 MED ORDER — POTASSIUM CHLORIDE 10 MEQ/100ML IV SOLN
10.0000 meq | INTRAVENOUS | Status: AC
  Administered 2011-02-14 (×2): 10 meq via INTRAVENOUS
  Filled 2011-02-14: qty 300

## 2011-02-14 NOTE — Progress Notes (Signed)
10 Days Post-Op  Subjective: No complaint of pain.  Tolerating PO.  Normal BM.   Objective: Vital signs in last 24 hours: Temp:  [97.7 F (36.5 C)-99.1 F (37.3 C)] 98.8 F (37.1 C) (08/18 1200) Pulse Rate:  [77-100] 82  (08/18 1000) Resp:  [12-27] 19  (08/18 1000) BP: (125-183)/(46-129) 125/56 mmHg (08/18 1000) SpO2:  [92 %-100 %] 100 % (08/18 1000) Weight:  [93.7 kg (206 lb 9.1 oz)] 206 lb 9.1 oz (93.7 kg) (08/18 0457) Last BM Date: 02/14/11  Intake/Output from previous day: 08/17 0701 - 08/18 0700 In: 1622 [P.O.:360; I.V.:260; IV Piggyback:1002] Out: 1625 [Urine:1625] Intake/Output this shift: I/O this shift: In: 790 [P.O.:240; I.V.:100; IV Piggyback:450] Out: -   General appearance: alert and no distress GI: +BS, soft, mild tenderness.  Inc intact, some scant discharge mostly serous.  Staples in place.  Lab Results:  @LABLAST2 (wbc:2,hgb:2,hct:2,plt:2) BMET  Basename 02/14/11 0514 02/12/11 0433  NA 135 133*  K 3.1* 3.5  CL 99 98  CO2 30 25  GLUCOSE 121* 159*  BUN 5* 13  CREATININE 0.60 0.60  CALCIUM 8.3* 8.6   PT/INR No results found for this basename: LABPROT:2,INR:2 in the last 72 hours ABG No results found for this basename: PHART:2,PCO2:2,PO2:2,HCO3:2 in the last 72 hours  Studies/Results: No results found.  Anti-infectives: Anti-infectives     Start     Dose/Rate Route Frequency Ordered Stop   02/08/11 1000   vancomycin (VANCOCIN) IVPB 1000 mg/200 mL premix  Status:  Discontinued        1,000 mg 200 mL/hr over 60 Minutes Intravenous Every 12 hours 02/08/11 0810 02/11/11 1102   02/08/11 0800   levofloxacin (LEVAQUIN) IVPB 500 mg  Status:  Discontinued        500 mg 100 mL/hr over 60 Minutes Intravenous Every 24 hours 02/08/11 0756 02/11/11 1102          Assessment/Plan: s/p Procedure(s): EXPLORATORY LAPAROTOMY Hg stable.  Continue to monitor.  If remains stable through weekend consider d/c early in the week.  Watching wound.  Continue Abx  for now.  If it appears that the wound infection is progressing will d/c some staples in the next 1-2 days.  Looks fair today.  LOS: 19 days    [name] C 02/14/2011

## 2011-02-14 NOTE — Progress Notes (Addendum)
Per nursing, incision started draining serosanguineous fluid yesterday. Subjective: Feels better. Eating okay. Having bowel movements.  Objective: Vital signs in last 24 hours: Filed Vitals:   02/14/11 0400 02/14/11 0457 02/14/11 0500 02/14/11 0600  BP: 158/83  130/58 138/68  Pulse: 100  79 78  Temp: 98.8 F (37.1 C)     TempSrc: Oral     Resp: 20  17 18   Height:      Weight:  93.7 kg (206 lb 9.1 oz)    SpO2: 97%  99% 92%   Weight change: -3.8 kg (-8 lb 6 oz)  Intake/Output Summary (Last 24 hours) at 02/14/11 0935 Last data filed at 02/14/11 0900  Gross per 24 hour  Intake   1742 ml  Output   1025 ml  Net    717 ml   General appearance: Awake. Groggy. Comfortable HEENT: Dry mucous membranes. Lungs: clear to auscultation bilaterally Heart:: S1-S2 with mild tachycardia and soft systolic murmur. Abdomen: soft; nondistended, erythema around incision remains.;  Appropriately tender. Serosanguineous drainage on dressing. No odor. Extremities: Trace of pedal edema.  Lab Results: Basic Metabolic Panel:  Basename 02/14/11 0514 02/12/11 0433  NA 135 133*  K 3.1* 3.5  CL 99 98  CO2 30 25  GLUCOSE 121* 159*  BUN 5* 13  CREATININE 0.60 0.60  CALCIUM 8.3* 8.6  MG -- --  PHOS -- --    Basename 02/14/11 0514 02/13/11 0421  WBC 5.2 5.4  NEUTROABS -- --  HGB 10.1* 9.5*  HCT 29.1* 28.0*  MCV 86.9 88.1  PLT 189 156    Basename 02/14/11 0740 02/14/11 0431 02/14/11 0019 02/13/11 2012 02/13/11 1644 02/13/11 1135  GLUCAP 132* 129* 127* 128* 142* 127*    Scheduled Meds:    . ceftaroline (TEFLARO) 600 mg IVPB  600 mg Intravenous Q12H  . darifenacin  7.5 mg Oral Daily  . insulin aspart  0-3 Units Subcutaneous Q4H  . sodium chloride  10 mL Intravenous Q12H  . DISCONTD: darifenacin  7.5-15 mg Oral Daily   Continuous Infusions:    . dextrose 5 % and 0.9 % NaCl with KCl 20 mEq/L 20 mL/hr at 02/14/11 0600   PRN Meds:.bisacodyl, diphenhydrAMINE, diphenhydrAMINE,  HYDROcodone-acetaminophen, HYDROmorphone, naloxone, ondansetron (ZOFRAN) IV, sodium chloride, sodium phosphate Assessment/Plan: Patient Active Hospital Problem List: Hematemesis/GI Blood Loss secondary to Pancreatic/Gastric/Duodenal Mass Stable. Outpatient referral to [facility] surgeons. Poor candidate for Whipple procedure. Transferred to the floor.  Hypokalemia: Replete  Abdominal Wall Cellulitis, now with drainage. Continue antibiotics.   H. pylori infection. Eventual treatment   Acute blood loss Anemia (01/26/2011):   Stable  Type 2 DM (09/13/2007):  Stable    Hypertension (09/13/2007):  Stable.     LOS: 19 days   Bayla Mcgovern L 02/14/2011, 9:35 AM

## 2011-02-14 NOTE — Progress Notes (Signed)
Report given to Ian Malkin, RN. Pt transported to room 306 in wheelchair via NT on room air and off tele. Pt alert, oriented and in stable condition at the time of transport. Pt's belongings sent with pt.

## 2011-02-15 LAB — GLUCOSE, CAPILLARY
Glucose-Capillary: 116 mg/dL — ABNORMAL HIGH (ref 70–99)
Glucose-Capillary: 147 mg/dL — ABNORMAL HIGH (ref 70–99)

## 2011-02-15 LAB — CBC
HCT: 30.1 % — ABNORMAL LOW (ref 36.0–46.0)
MCH: 29.8 pg (ref 26.0–34.0)
MCV: 88 fL (ref 78.0–100.0)
RBC: 3.42 MIL/uL — ABNORMAL LOW (ref 3.87–5.11)
WBC: 5 10*3/uL (ref 4.0–10.5)

## 2011-02-15 LAB — BASIC METABOLIC PANEL
BUN: 5 mg/dL — ABNORMAL LOW (ref 6–23)
CO2: 30 mEq/L (ref 19–32)
Calcium: 8.2 mg/dL — ABNORMAL LOW (ref 8.4–10.5)
Chloride: 97 mEq/L (ref 96–112)
Creatinine, Ser: 0.66 mg/dL (ref 0.50–1.10)

## 2011-02-15 MED ORDER — POTASSIUM CHLORIDE 10 MEQ/100ML IV SOLN
10.0000 meq | INTRAVENOUS | Status: AC
  Administered 2011-02-15 (×4): 10 meq via INTRAVENOUS
  Filled 2011-02-15 (×3): qty 100

## 2011-02-15 MED ORDER — POTASSIUM CHLORIDE 10 MEQ/100ML IV SOLN
INTRAVENOUS | Status: AC
  Administered 2011-02-15: 10 meq via INTRAVENOUS
  Filled 2011-02-15: qty 100

## 2011-02-15 NOTE — Progress Notes (Signed)
11 Days Post-Op  Subjective: No complaints.  No issues with pain.  Some d/c from dressing.   Objective: Vital signs in last 24 hours: Temp:  [98 F (36.7 C)-98.6 F (37 C)] 98 F (36.7 C) (08/19 0450) Pulse Rate:  [75-88] 76  (08/19 0450) Resp:  [16-20] 20  (08/19 0450) BP: (149-172)/(69-82) 172/69 mmHg (08/19 0450) SpO2:  [92 %-99 %] 95 % (08/19 0450) Last BM Date: 02/14/11  Intake/Output from previous day: 08/18 0701 - 08/19 0700 In: 1510 [P.O.:960; I.V.:100; IV Piggyback:450] Out: 200 [Urine:200] Intake/Output this shift: I/O this shift: In: 360 [P.O.:360] Out: 200 [Urine:200]  General appearance: alert and no distress GI: Soft, non-tender.  Non-distended.  Inc intact.  Did remove some superior stables.  Some clear orange serous fluid incountered.  No odor.  no purulence.  Dressed.  Lab Results:  @LABLAST2 (wbc:2,hgb:2,hct:2,plt:2) BMET  Basename 02/15/11 0408 02/14/11 0514  NA 135 135  K 3.1* 3.1*  CL 97 99  CO2 30 30  GLUCOSE 128* 121*  BUN 5* 5*  CREATININE 0.66 0.60  CALCIUM 8.2* 8.3*   PT/INR No results found for this basename: LABPROT:2,INR:2 in the last 72 hours ABG No results found for this basename: PHART:2,PCO2:2,PO2:2,HCO3:2 in the last 72 hours  Studies/Results: No results found.  Anti-infectives: Anti-infectives     Start     Dose/Rate Route Frequency Ordered Stop   02/08/11 1000   vancomycin (VANCOCIN) IVPB 1000 mg/200 mL premix  Status:  Discontinued        1,000 mg 200 mL/hr over 60 Minutes Intravenous Every 12 hours 02/08/11 0810 02/11/11 1102   02/08/11 0800   levofloxacin (LEVAQUIN) IVPB 500 mg  Status:  Discontinued        500 mg 100 mL/hr over 60 Minutes Intravenous Every 24 hours 02/08/11 0756 02/11/11 1102          Assessment/Plan: s/p Procedure(s): EXPLORATORY LAPAROTOMY Doing well.  Slight wound infection.  Cont local care.  Start d./c planning.  LOS: 20 days    Joselle Deeds C 02/15/2011

## 2011-02-15 NOTE — Progress Notes (Addendum)
  Subjective: No complaints. Pain well controlled.  Objective: Vital signs in last 24 hours: Filed Vitals:   02/14/11 1500 02/14/11 2258 02/14/11 2349 02/15/11 0450  BP: 149/77 154/82  172/69  Pulse: 75 88  76  Temp: 98.6 F (37 C) 98 F (36.7 C)  98 F (36.7 C)  TempSrc:  Axillary  Oral  Resp: 16 20  20   Height:      Weight:      SpO2: 96% 92% 99% 95%   Weight change:   Intake/Output Summary (Last 24 hours) at 02/15/11 1348 Last data filed at 02/15/11 0917  Gross per 24 hour  Intake    840 ml  Output    200 ml  Net    640 ml   General appearance: Awake. Comfortable Lungs: clear to auscultation bilaterally Heart:: S1-S2 with mild tachycardia and soft systolic murmur. Abdomen: soft; nondistended, erythema around incision remains, but improved from yesterday.;  Appropriately tender. Serosanguineous drainage on dressing. No odor. Extremities: Trace of pedal edema.  Lab Results: Basic Metabolic Panel:  Basename 02/15/11 0408 02/14/11 0514  NA 135 135  K 3.1* 3.1*  CL 97 99  CO2 30 30  GLUCOSE 128* 121*  BUN 5* 5*  CREATININE 0.66 0.60  CALCIUM 8.2* 8.3*  MG -- 1.6  PHOS -- --    Basename 02/15/11 0408 02/14/11 0514  WBC 5.0 5.2  NEUTROABS -- --  HGB 10.2* 10.1*  HCT 30.1* 29.1*  MCV 88.0 86.9  PLT 236 189    Basename 02/15/11 1121 02/15/11 0722 02/15/11 0410 02/15/11 0023 02/14/11 2052 02/14/11 1640  GLUCAP 116* 128* 142* 143* 137* 130*    Scheduled Meds:    . ceftaroline (TEFLARO) 600 mg IVPB  600 mg Intravenous Q12H  . darifenacin  7.5 mg Oral Daily  . insulin aspart  0-3 Units Subcutaneous Q4H  . sodium chloride  10 mL Intravenous Q12H   Continuous Infusions:    . dextrose 5 % and 0.9 % NaCl with KCl 20 mEq/[name] 20 mL/hr (02/15/11 0229)   PRN Meds:.bisacodyl, diphenhydrAMINE, diphenhydrAMINE, HYDROcodone-acetaminophen, HYDROmorphone, ondansetron (ZOFRAN) IV, sodium chloride, sodium phosphate Assessment/Plan: Patient Active Hospital Problem  List: Hematemesis/GI Blood Loss secondary to Pancreatic/Gastric/Duodenal Mass Stable. Outpatient referral to [facility] surgeons. Poor candidate for Whipple procedure.   Hypokalemia: Replete  Abdominal Wall Cellulitis, now with drainage. Continue antibiotics.   H. pylori infection. Eventual treatment   Acute blood loss Anemia (01/26/2011):   Stable  Type 2 DM (09/13/2007):  Stable    Hypertension (09/13/2007):  Stable.   PT eval.  Lives alone.  Agreeable to [facility]   LOS: 20 days   [name] 02/15/2011, 1:48 PM

## 2011-02-16 ENCOUNTER — Other Ambulatory Visit: Payer: Self-pay

## 2011-02-16 LAB — MAGNESIUM: Magnesium: 1.6 mg/dL (ref 1.5–2.5)

## 2011-02-16 LAB — GLUCOSE, CAPILLARY
Glucose-Capillary: 123 mg/dL — ABNORMAL HIGH (ref 70–99)
Glucose-Capillary: 158 mg/dL — ABNORMAL HIGH (ref 70–99)

## 2011-02-16 MED ORDER — MAGNESIUM SULFATE 40 MG/ML IJ SOLN
INTRAMUSCULAR | Status: AC
  Filled 2011-02-16: qty 50

## 2011-02-16 MED ORDER — SODIUM CHLORIDE 0.9 % IJ SOLN
INTRAMUSCULAR | Status: AC
  Administered 2011-02-16: 07:00:00
  Filled 2011-02-16: qty 10

## 2011-02-16 MED ORDER — MAGNESIUM SULFATE 50 % IJ SOLN
1.0000 g | Freq: Once | INTRAMUSCULAR | Status: AC
  Administered 2011-02-16: 1 g via INTRAVENOUS
  Filled 2011-02-16: qty 2

## 2011-02-16 MED ORDER — SODIUM CHLORIDE 0.9 % IV SOLN
Freq: Once | INTRAVENOUS | Status: AC
  Administered 2011-02-16: 10:00:00 via INTRAVENOUS

## 2011-02-16 MED ORDER — POTASSIUM CHLORIDE CRYS ER 20 MEQ PO TBCR
20.0000 meq | EXTENDED_RELEASE_TABLET | Freq: Two times a day (BID) | ORAL | Status: AC
  Administered 2011-02-16 (×2): 20 meq via ORAL
  Filled 2011-02-16 (×2): qty 1

## 2011-02-16 NOTE — Progress Notes (Signed)
  Subjective: The patient is sitting in the chair. He has no complaints of abdominal pain nausea or vomiting. No chest pain and no shortness of breath.  Objective: Vital signs in last 24 hours: Filed Vitals:   02/15/11 1400 02/15/11 2114 02/15/11 2138 02/16/11 0509  BP: 145/83 165/100  148/76  Pulse: 86 82  86  Temp: 99 F (37.2 C) 99.3 F (37.4 C)  99 F (37.2 C)  TempSrc:      Resp: 16 20  16   Height:      Weight:      SpO2: 93% 97% 97% 95%   Weight change:   Intake/Output Summary (Last 24 hours) at 02/16/11 1200 Last data filed at 02/16/11 0944  Gross per 24 hour  Intake    500 ml  Output   1540 ml  Net  -1040 ml   General appearance: Awake. Comfortable Lungs: Occasional crackles heard in the bases. Reading is nonlabored. Heart:: S1-S2 with mild tachycardia and soft systolic murmur. Abdomen: soft; nondistended, erythema around incision remains; there is some drainage, nonpurulent, staples in place with surrounding erythema. Extremities: Trace of pedal edema.  Lab Results: Basic Metabolic Panel:  Basename 02/15/11 0408 02/14/11 0514  NA 135 135  K 3.1* 3.1*  CL 97 99  CO2 30 30  GLUCOSE 128* 121*  BUN 5* 5*  CREATININE 0.66 0.60  CALCIUM 8.2* 8.3*  MG -- 1.6  PHOS -- --    Basename 02/15/11 0408 02/14/11 0514  WBC 5.0 5.2  NEUTROABS -- --  HGB 10.2* 10.1*  HCT 30.1* 29.1*  MCV 88.0 86.9  PLT 236 189    Basename 02/16/11 1126 02/16/11 0803 02/16/11 0401 02/16/11 0009 02/15/11 1953 02/15/11 1618  GLUCAP 152* 139* 120* 120* 139* 147*    Scheduled Meds:    . sodium chloride   Intravenous Once  . ceftaroline (TEFLARO) 600 mg IVPB  600 mg Intravenous Q12H  . darifenacin  7.5 mg Oral Daily  . insulin aspart  0-3 Units Subcutaneous Q4H  . potassium chloride  10 mEq Intravenous Q1 Hr x 4  . potassium chloride  20 mEq Oral BID  . sodium chloride  10 mL Intravenous Q12H  . sodium chloride       Continuous Infusions:    . DISCONTD: dextrose 5 % and  0.9 % NaCl with KCl 20 mEq/L 20 mL/hr (02/15/11 0229)   PRN Meds:.bisacodyl, diphenhydrAMINE, diphenhydrAMINE, HYDROcodone-acetaminophen, HYDROmorphone, ondansetron (ZOFRAN) IV, sodium chloride, sodium phosphate Assessment/Plan: Patient Active Hospital Problem List: Hematemesis/GI Blood Loss secondary to Pancreatic/Gastric/Duodenal Mass Stable. Outpatient referral to [facility] surgeons. Poor candidate for Whipple procedure.   Hypokalemia: Status post IV potassium runs yesterday. Her potassium is still 3.1. We will start potassium chloride orally. We will check her magnesium level to rule out deficiency. We'll check labs in the morning.  Abdominal Wall Cellulitis, now with drainage. Continue antibiotics.   H. pylori infection. Eventual treatment   Acute blood loss Anemia (01/26/2011):   Stable  Type 2 DM (09/13/2007):  Stable    Hypertension (09/13/2007):  Stable.    Deconditioning. The patient is likely going to need skilled nursing facility placement. She is in agreement with this. Physical therapy evaluation noted.    LOS: 21 days   Benjamen Koelling 02/16/2011, 12:00 PM

## 2011-02-16 NOTE — Progress Notes (Signed)
12 Days Post-Op  Subjective: No significant issues today.  Tolerating PO.  No pain issues.  Pt agreeable to rehab placement.  Objective: Vital signs in last 24 hours: Temp:  [98.9 F (37.2 C)-99.3 F (37.4 C)] 98.9 F (37.2 C) (08/20 1400) Pulse Rate:  [82-88] 88  (08/20 1400) Resp:  [16-20] 20  (08/20 1400) BP: (148-165)/(76-100) 148/77 mmHg (08/20 1400) SpO2:  [94 %-97 %] 94 % (08/20 1400) Last BM Date: 02/14/11  Intake/Output from previous day: 08/19 0701 - 08/20 0700 In: 840 [P.O.:840] Out: 1500 [Urine:1500] Intake/Output this shift: I/O this shift: In: 500 [P.O.:480; I.V.:20] Out: 1040 [Urine:1040]  General appearance: alert and no distress GI: +BS, soft, NT, ND.  Inc open at superior aspect.  Scant serosanginous d/c.  Staples otherwise in place.  Lab Results:  @LABLAST2 (wbc:2,hgb:2,hct:2,plt:2) BMET  Basename 02/15/11 0408 02/14/11 0514  NA 135 135  K 3.1* 3.1*  CL 97 99  CO2 30 30  GLUCOSE 128* 121*  BUN 5* 5*  CREATININE 0.66 0.60  CALCIUM 8.2* 8.3*   PT/INR No results found for this basename: LABPROT:2,INR:2 in the last 72 hours ABG No results found for this basename: PHART:2,PCO2:2,PO2:2,HCO3:2 in the last 72 hours  Studies/Results: No results found.  Anti-infectives: Anti-infectives     Start     Dose/Rate Route Frequency Ordered Stop   02/08/11 1000   vancomycin (VANCOCIN) IVPB 1000 mg/200 mL premix  Status:  Discontinued        1,000 mg 200 mL/hr over 60 Minutes Intravenous Every 12 hours 02/08/11 0810 02/11/11 1102   02/08/11 0800   levofloxacin (LEVAQUIN) IVPB 500 mg  Status:  Discontinued        500 mg 100 mL/hr over 60 Minutes Intravenous Every 24 hours 02/08/11 0756 02/11/11 1102          Assessment/Plan: s/p Procedure(s): EXPLORATORY LAPAROTOMY Doing well.  Continue local care to wound.  OK to d/c to rehab from my standpoint when bed available.  LOS: 21 days    Neli Fofana C 02/16/2011

## 2011-02-16 NOTE — Progress Notes (Signed)
Physical Therapy Treatment Patient Name: [name] [id number] Date: 02/16/2011  TIME: 1005-1025 CHARGES: 1 TE 1GT Problem List:  Patient Active Problem List  Diagnoses  . DIABETES MELLITUS, TYPE II  . HYPERLIPIDEMIA  . OBESITY, UNSPECIFIED  . DEPRESSION  . HYPERTENSION  . SINUSITIS, ACUTE  . ALLERGIC RHINITIS, SEASONAL  . OTHER CHRONIC BRONCHITIS  . DIVERTICULOSIS OF COLON  . ACUTE CYSTITIS  . INSOMNIA  . FATIGUE  . UNSTEADY GAIT  . HEADACHE  . OTHER URINARY INCONTINENCE  . CLOSED FRACTURE OF OLECRANON PROCESS OF ULNA  . HEMATOMA  . HEADACHES, HX OF  . Anorexia  . Anemia  . Rectal mass  . Syncope and collapse  . Hepatic steatosis  . Hyponatremia  . Gastric mass  . H pylori ulcer  . Ulcer, colon  . Acute renal failure  . S/P exploratory laparotomy  . Pericardial effusion  . Fever, postprocedural   Past Medical History:  Past Medical History  Diagnosis Date  . Hypertension   . Reflux   . Right elbow pain     OTIF  . Dementia   . Depression   . Osteoarthritis   . Pancreatitis 11/2007    [facility]  . Angiomyolipoma of kidney     right  . Ulcerative esophagitis 12/05/2007    hx elevated gastrin, severe on EGD by Dr Jena Gauss , h pylori negative  . Hiatal hernia   . S/P colonoscopy 2009    pt reports normal by Dr Lovell Sheehan  . Diabetes mellitus   . Anemia    Past Surgical History:  Past Surgical History  Procedure Date  . Orif right hip 1999    [facility]  . Umbilical hernia repair 69 months old    [place]  . Esophagogastroduodenoscopy 01/28/2011    Procedure: ESOPHAGOGASTRODUODENOSCOPY (EGD);  Surgeon: Arlyce Harman, MD;  Location: [facility];  Service: Endoscopy;  Laterality: N/A;  . Colonoscopy 01/28/2011    Procedure: COLONOSCOPY;  Surgeon: Arlyce Harman, MD;  Location: [facility] END[facility];  Service: Endoscopy;  Laterality: N/A;  . Laparotomy 02/04/2011    Procedure: EXPLORATORY LAPAROTOMY;  Surgeon: Fabio Bering;  Location: [facility] ORS;  Service: General;  Laterality: N/A;     Precautions/Restrictions  Precautions Required Braces or Orthoses: No Restrictions Weight Bearing Restrictions: No Mobility (including Balance) Transfers Sit to Stand: 6: Modified independent (Device/Increase time) Stand to Sit: 6: Modified independent (Device/Increase time) Ambulation/Gait Ambulation/Gait: Yes Ambulation/Gait Assistance: 5: Supervision Ambulation/Gait Assistance Details (indicate cue type and reason): RW;multiple vc's to slow down Ambulation Distance (Feet): 70 Feet Assistive device: Rolling walker Gait Pattern: Decreased stance time - right Gait velocity: quick Stairs: No Wheelchair Mobility Wheelchair Mobility: No    Exercise  Total Joint Exercises Hip ABduction/ADduction: Both;10 reps;Seated Long Arc Quad: Both;10 reps General Exercises - Lower Extremity Long Arc Quad: Both;10 reps Hip ABduction/ADduction: Both;10 reps;Seated Hip Flexion/Marching: Both;Seated (1 min) Toe Raises: Both;10 reps;Seated Heel Raises: 10 reps;Both;Seated Low Level/ICU Exercises Hip ABduction/ADduction: Both;10 reps;Seated  End of Session PT - End of Session Equipment Utilized During Treatment: Gait belt Activity Tolerance: Patient tolerated treatment well Patient left: in chair;with call bell in reach General Behavior During Session: [facility] for tasks performed Cognition: [facility] for tasks performed PT Assessment/Plan  PT - Assessment/Plan Comments on Treatment Session: pt tolerated all therapy well;pt tends to ambulate very quickly and not able/willing to slow down with vc's which puts her at risk for falls PT Goals  Acute Rehab PT Goals PT Goal: Ambulate -  Progress: Progressing toward goal  Jesse Hirst ATKINSO 02/16/2011, 11:24 AM

## 2011-02-17 LAB — CARDIAC PANEL(CRET KIN+CKTOT+MB+TROPI)
CK, MB: 1.8 ng/mL (ref 0.3–4.0)
Relative Index: INVALID (ref 0.0–2.5)
Total CK: 31 U/L (ref 7–177)
Troponin I: <0.3 ng/mL (ref <0.30)
Troponin I: <0.3 ng/mL (ref <0.30)

## 2011-02-17 LAB — BASIC METABOLIC PANEL
BUN: 5 mg/dL — ABNORMAL LOW (ref 6–23)
Chloride: 98 mEq/L (ref 96–112)
Creatinine, Ser: 0.74 mg/dL (ref 0.50–1.10)
GFR calc Af Amer: >60 mL/min (ref >60)
GFR calc non Af Amer: >60 mL/min (ref >60)
Glucose, Bld: 133 mg/dL — ABNORMAL HIGH (ref 70–99)
Potassium: 3.5 mEq/L (ref 3.5–5.1)

## 2011-02-17 LAB — GLUCOSE, CAPILLARY
Glucose-Capillary: 127 mg/dL — ABNORMAL HIGH (ref 70–99)
Glucose-Capillary: 130 mg/dL — ABNORMAL HIGH (ref 70–99)
Glucose-Capillary: 155 mg/dL — ABNORMAL HIGH (ref 70–99)

## 2011-02-17 MED ORDER — SODIUM CHLORIDE 0.9 % IJ SOLN
INTRAMUSCULAR | Status: AC
  Administered 2011-02-17: 06:00:00
  Filled 2011-02-17: qty 10

## 2011-02-17 MED ORDER — SODIUM CHLORIDE 0.9 % IJ SOLN
INTRAMUSCULAR | Status: AC
  Filled 2011-02-17: qty 10

## 2011-02-17 MED ORDER — SODIUM CHLORIDE 0.9 % IJ SOLN
INTRAMUSCULAR | Status: AC
  Administered 2011-02-17: 10 mL
  Filled 2011-02-17: qty 10

## 2011-02-17 MED ORDER — ACETAMINOPHEN 325 MG PO TABS: 650.0000 mg | ORAL_TABLET | Freq: Four times a day (QID) | ORAL | Status: DC | PRN

## 2011-02-17 MED ORDER — AMLODIPINE BESYLATE 5 MG PO TABS
5.0000 mg | ORAL_TABLET | Freq: Every day | ORAL | Status: DC
  Administered 2011-02-17 – 2011-02-20 (×4): 5 mg via ORAL
  Filled 2011-02-17 (×4): qty 1

## 2011-02-17 MED ORDER — BENAZEPRIL HCL 10 MG PO TABS
10.0000 mg | ORAL_TABLET | Freq: Every day | ORAL | Status: DC
  Administered 2011-02-17 – 2011-02-18 (×2): 10 mg via ORAL
  Filled 2011-02-17 (×2): qty 1

## 2011-02-17 NOTE — Progress Notes (Signed)
13 Days Post-Op  Subjective: No significant change. Tolerating by mouth. Normal bowel function. No fevers or chills.  Objective: Vital signs in last 24 hours: Temp:  [98.3 F (36.8 C)-99.1 F (37.3 C)] 98.3 F (36.8 C) (08/21 0600) Pulse Rate:  [88-94] 90  (08/21 0600) Resp:  [20] 20  (08/21 0600) BP: (148-191)/(77-97) 182/97 mmHg (08/21 0600) SpO2:  [92 %-100 %] 100 % (08/21 0600) Last BM Date: 02/14/11  Intake/Output from previous day: 08/20 0701 - 08/21 0700 In: 2735 [P.O.:1440; I.V.:45; IV Piggyback:1250] Out: 1340 [Urine:1340] Intake/Output this shift: I/O this shift: In: 290 [I.V.:20; IV Piggyback:270] Out: 250 [Urine:250]  General appearance: alert and no distress GI: Positive bowel sounds. Soft, obese, mild epigastric discomfort. Incision open at superior aspect. Scant discharge. Staples present in remainder incision. Incision otherwise clean dry and intact.  Lab Results:  @LABLAST2 (wbc:2,hgb:2,hct:2,plt:2) BMET  Basename 02/17/11 0606 02/15/11 0408  NA 135 135  K 3.5 3.1*  CL 98 97  CO2 29 30  GLUCOSE 133* 128*  BUN 5* 5*  CREATININE 0.74 0.66  CALCIUM 9.0 8.2*   PT/INR No results found for this basename: LABPROT:2,INR:2 in the last 72 hours ABG No results found for this basename: PHART:2,PCO2:2,PO2:2,HCO3:2 in the last 72 hours  Studies/Results: No results found.  Anti-infectives: Anti-infectives     Start     Dose/Rate Route Frequency Ordered Stop   02/08/11 1000   vancomycin (VANCOCIN) IVPB 1000 mg/200 mL premix  Status:  Discontinued        1,000 mg 200 mL/hr over 60 Minutes Intravenous Every 12 hours 02/08/11 0810 02/11/11 1102   02/08/11 0800   levofloxacin (LEVAQUIN) IVPB 500 mg  Status:  Discontinued        500 mg 100 mL/hr over 60 Minutes Intravenous Every 24 hours 02/08/11 0756 02/11/11 1102          Assessment/Plan: s/p Procedure(s): EXPLORATORY LAPAROTOMY No significant change. Agree with discharge to rehabilitation. Okay to  discharge rehabilitation from my standpoint when bed available. Should bleeding from the ulcer become an issue again at that point we'll discuss transfer to [facility].  LOS: 22 days    Daymian Lill C 02/17/2011

## 2011-02-17 NOTE — Progress Notes (Signed)
  Subjective:  She says that she doesn't feel as good as she did yesterday. She has some abdominal soreness. No nausea or vomiting. He complains of a headache.  Objective: Vital signs in last 24 hours: Filed Vitals:   02/16/11 1900 02/16/11 2200 02/17/11 0600 02/17/11 1400  BP:  191/93 182/97 166/74  Pulse:  94 90 92  Temp:  99.1 F (37.3 C) 98.3 F (36.8 C) 98.4 F (36.9 C)  TempSrc:  Oral Oral Oral  Resp:  20 20 20   Height:      Weight:      SpO2: 92% 93% 100% 100%   Weight change:   Intake/Output Summary (Last 24 hours) at 02/17/11 1727 Last data filed at 02/17/11 1336  Gross per 24 hour  Intake    805 ml  Output    550 ml  Net    255 ml   General appearance: Currently laying in bed. No acute distress. Lungs: Occasional crackles heard in the bases. Reading is nonlabored. Heart:: S1-S2 with mild tachycardia and soft systolic murmur. Abdomen: soft; nondistended, erythema around incision remains; there is some drainage, nonpurulent, staples in place with surrounding erythema. Extremities: Trace of pedal edema.  Lab Results: Basic Metabolic Panel:  Basename 02/17/11 0606 02/16/11 1240 02/15/11 0408  NA 135 -- 135  K 3.5 -- 3.1*  CL 98 -- 97  CO2 29 -- 30  GLUCOSE 133* -- 128*  BUN 5* -- 5*  CREATININE 0.74 -- 0.66  CALCIUM 9.0 -- 8.2*  MG 2.0 1.6 --  PHOS -- -- --    Basename 02/15/11 0408  WBC 5.0  NEUTROABS --  HGB 10.2*  HCT 30.1*  MCV 88.0  PLT 236    Basename 02/17/11 1705 02/17/11 0721 02/17/11 0418 02/17/11 0023 02/16/11 2035 02/16/11 1638  GLUCAP 129* 130* 143* 127* 158* 123*    Scheduled Meds:    . amLODipine  5 mg Oral Daily  . benazepril  10 mg Oral Daily  . ceftaroline (TEFLARO) 600 mg IVPB  600 mg Intravenous Q12H  . darifenacin  7.5 mg Oral Daily  . insulin aspart  0-3 Units Subcutaneous Q4H  . magnesium sulfate infusion  1 g Intravenous Once  . potassium chloride  20 mEq Oral BID  . sodium chloride  10 mL Intravenous Q12H  .  sodium chloride      . sodium chloride       Continuous Infusions:   PRN Meds:.acetaminophen, bisacodyl, diphenhydrAMINE, diphenhydrAMINE, HYDROcodone-acetaminophen, HYDROmorphone, ondansetron (ZOFRAN) IV, sodium chloride, sodium phosphate Assessment/Plan: Patient Active Hospital Problem List: Hematemesis/GI Blood Loss secondary to Pancreatic/Gastric/Duodenal Mass Stable. Outpatient referral to [facility] surgeons. Poor candidate for Whipple procedure.   Hypokalemia: She is being repleted. Her serum potassium is better peer  Abdominal Wall Cellulitis, now with drainage. Continue antibiotics.   H. pylori infection. Eventual treatment   Acute blood loss Anemia (01/26/2011):   Stable  Type 2 DM (09/13/2007):  Stable    Hypertension (09/13/2007):  Her blood pressure has trended up. We have restarted Norvasc and benazepril.    Deconditioning. The patient is likely going to need skilled nursing facility placement. Plan discharge tomorrow, pending bed availability and insurance coverage.    LOS: 22 days   [name] 02/17/2011, 5:27 PM

## 2011-02-17 NOTE — Progress Notes (Signed)
Contacted Dr. Onalee Hua about EKG results. Ordered Cardiac Enzymes Q8H x3. No further orders at this time. Rolm Bookbinder. Christell Constant, RN

## 2011-02-17 NOTE — Progress Notes (Signed)
Patient complaining of mild epigastric/chest pain. VSS. (Increased BP-Hx of HTN) EKG ordered. O2 2L [employer]. Continued monitoring. Rolm Bookbinder. Christell Constant, RN

## 2011-02-18 LAB — GLUCOSE, CAPILLARY
Glucose-Capillary: 141 mg/dL — ABNORMAL HIGH (ref 70–99)
Glucose-Capillary: 135 mg/dL — ABNORMAL HIGH (ref 70–99)

## 2011-02-18 LAB — BASIC METABOLIC PANEL
CO2: 29 mEq/L (ref 19–32)
Chloride: 98 mEq/L (ref 96–112)
GFR calc Af Amer: >60 mL/min (ref >60)
Potassium: 3.3 mEq/L — ABNORMAL LOW (ref 3.5–5.1)

## 2011-02-18 MED ORDER — SODIUM CHLORIDE 0.9 % IJ SOLN
INTRAMUSCULAR | Status: AC
  Filled 2011-02-18: qty 10

## 2011-02-18 MED ORDER — POTASSIUM CHLORIDE CRYS ER 20 MEQ PO TBCR
20.0000 meq | EXTENDED_RELEASE_TABLET | Freq: Two times a day (BID) | ORAL | Status: DC
  Administered 2011-02-18 – 2011-02-20 (×5): 20 meq via ORAL
  Filled 2011-02-18 (×5): qty 1

## 2011-02-18 MED ORDER — SENNA 8.6 MG PO TABS
1.0000 | ORAL_TABLET | Freq: Every day | ORAL | Status: DC
  Administered 2011-02-18 – 2011-02-19 (×2): 8.6 mg via ORAL
  Filled 2011-02-18 (×2): qty 1

## 2011-02-18 NOTE — Progress Notes (Signed)
  Subjective: The patient is sitting up in bed eating lunch. She says her abdomen is sore, but she has no complaints of nausea or vomiting. Her appetite has been fair.  Objective: Vital signs in last 24 hours: Filed Vitals:   02/17/11 1400 02/17/11 2233 02/18/11 0516 02/18/11 1200  BP: 166/74 155/83 158/81   Pulse: 92 90 77   Temp: 98.4 F (36.9 C) 98.3 F (36.8 C) 98.6 F (37 C)   TempSrc: Oral Oral Oral   Resp: 20 19 18    Height:      Weight:      SpO2: 100% 93% 92% 93%   Weight change:   Intake/Output Summary (Last 24 hours) at 02/18/11 1414 Last data filed at 02/18/11 0800  Gross per 24 hour  Intake    240 ml  Output    500 ml  Net   -260 ml   General appearance: No acute distress. Lungs: Occasional crackles heard in the bases. Breathing is nonlabored. Heart:: S1-S2 with mild tachycardia and soft systolic murmur. Abdomen: soft; nondistended, bandage/dressing is in place, not removed. The dressing is clean and without drainage. Extremities: Trace of pedal edema.  Lab Results: Basic Metabolic Panel:  Basename 02/18/11 0448 02/17/11 0606 02/16/11 1240  NA 136 135 --  K 3.3* 3.5 --  CL 98 98 --  CO2 29 29 --  GLUCOSE 123* 133* --  BUN 6 5* --  CREATININE 0.66 0.74 --  CALCIUM 8.6 9.0 --  MG -- 2.0 1.6  PHOS -- -- --   No results found for this basename: WBC:2,NEUTROABS:2,HGB:2,HCT:2,MCV:2,PLT:2 in the last 72 hours  Basename 02/18/11 1118 02/18/11 0718 02/17/11 2009 02/17/11 1705 02/17/11 0721 02/17/11 0418  GLUCAP 135* 141* 155* 129* 130* 143*    Scheduled Meds:    . amLODipine  5 mg Oral Daily  . benazepril  10 mg Oral Daily  . ceftaroline (TEFLARO) 600 mg IVPB  600 mg Intravenous Q12H  . darifenacin  7.5 mg Oral Daily  . insulin aspart  0-3 Units Subcutaneous Q4H  . potassium chloride  20 mEq Oral BID  . senna  1 tablet Oral QHS  . sodium chloride  10 mL Intravenous Q12H  . sodium chloride      . sodium chloride      . sodium chloride        Continuous Infusions:   PRN Meds:.acetaminophen, bisacodyl, diphenhydrAMINE, diphenhydrAMINE, HYDROcodone-acetaminophen, HYDROmorphone, ondansetron (ZOFRAN) IV, sodium chloride, sodium phosphate Assessment/Plan: Patient Active Hospital Problem List: Hematemesis/GI Blood Loss secondary to Pancreatic/Gastric/Duodenal Mass Stable. Status post embolization. Outpatient referral to [facility] surgeons, if needed. If needed, this will be arranged by Dr. Leticia Penna. Poor candidate for Whipple procedure.   Hypokalemia: She is being repleted.   Borderline hypomagnesemia: Status post IV magnesium sulfate.  Abdominal Wall Cellulitis, now with drainage. Continue Teflaro for now.   H. pylori infection. Eventual treatment. Will start Pylera once there is a steady state of fairly good oral intake of meals. This was discussed with gastroenterologist, Dr. Jena Gauss.   Acute blood loss Anemia (01/26/2011):   Stable  Type 2 DM (09/13/2007):  Stable    Hypertension (09/13/2007):  Her blood pressure has trended up.  Benazepril and Norvasc have been restarted. IV fluids were tapered off.    Deconditioning. The patient is likely going to need skilled nursing facility placement. Plan discharge, pending bed availability and insurance coverage.    LOS: 23 days   Shahrukh Pasch 02/18/2011, 2:14 PM

## 2011-02-18 NOTE — Progress Notes (Signed)
14 Days Post-Op  Subjective: No significant change.  No complaints today.  No fevers.  Tolerating PO.  Pain controlled.  Objective: Vital signs in last 24 hours: Temp:  [98.3 F (36.8 C)-98.6 F (37 C)] 98.6 F (37 C) (08/22 0516) Pulse Rate:  [77-92] 77  (08/22 0516) Resp:  [18-20] 18  (08/22 0516) BP: (155-166)/(74-83) 158/81 mmHg (08/22 0516) SpO2:  [92 %-100 %] 92 % (08/22 0516) Last BM Date: 02/14/11  Intake/Output from previous day: 08/21 0701 - 08/22 0700 In: 530 [P.O.:240; I.V.:20; IV Piggyback:270] Out: 650 [Urine:650] Intake/Output this shift: I/O this shift: In: 240 [P.O.:240] Out: 100 [Urine:100]  General appearance: alert and no distress GI: +BS, soft, inc open at top with scant discharge.  Otherwise inc c/d/i.  Lab Results:  @LABLAST2 (wbc:2,hgb:2,hct:2,plt:2) BMET  Basename 02/18/11 0448 02/17/11 0606  NA 136 135  K 3.3* 3.5  CL 98 98  CO2 29 29  GLUCOSE 123* 133*  BUN 6 5*  CREATININE 0.66 0.74  CALCIUM 8.6 9.0   PT/INR No results found for this basename: LABPROT:2,INR:2 in the last 72 hours ABG No results found for this basename: PHART:2,PCO2:2,PO2:2,HCO3:2 in the last 72 hours  Studies/Results: No results found.  Anti-infectives: Anti-infectives     Start     Dose/Rate Route Frequency Ordered Stop   02/08/11 1000   vancomycin (VANCOCIN) IVPB 1000 mg/200 mL premix  Status:  Discontinued        1,000 mg 200 mL/hr over 60 Minutes Intravenous Every 12 hours 02/08/11 0810 02/11/11 1102   02/08/11 0800   levofloxacin (LEVAQUIN) IVPB 500 mg  Status:  Discontinued        500 mg 100 mL/hr over 60 Minutes Intravenous Every 24 hours 02/08/11 0756 02/11/11 1102          Assessment/Plan: s/p Procedure(s): EXPLORATORY LAPAROTOMY D/c planning.  Hopeful d/c to rehab next couple days.  LOS: 23 days    Jb Dulworth C 02/18/2011

## 2011-02-18 NOTE — Progress Notes (Deleted)
  Subjective:  She says that she doesn't feel as good as she did yesterday. She has some abdominal soreness. No nausea or vomiting. He complains of a headache.  Objective: Vital signs in last 24 hours: Filed Vitals:   02/17/11 1400 02/17/11 2233 02/18/11 0516 02/18/11 1200  BP: 166/74 155/83 158/81   Pulse: 92 90 77   Temp: 98.4 F (36.9 C) 98.3 F (36.8 C) 98.6 F (37 C)   TempSrc: Oral Oral Oral   Resp: 20 19 18    Height:      Weight:      SpO2: 100% 93% 92% 93%   Weight change:   Intake/Output Summary (Last 24 hours) at 02/18/11 1420 Last data filed at 02/18/11 0800  Gross per 24 hour  Intake    240 ml  Output    500 ml  Net   -260 ml   General appearance: Currently laying in bed. No acute distress. Lungs: Occasional crackles heard in the bases. Reading is nonlabored. Heart:: S1-S2 with mild tachycardia and soft systolic murmur. Abdomen: soft; nondistended, erythema around incision remains; there is some drainage, nonpurulent, staples in place with surrounding erythema. Extremities: Trace of pedal edema.  Lab Results: Basic Metabolic Panel:  Basename 02/18/11 0448 02/17/11 0606 02/16/11 1240  NA 136 135 --  K 3.3* 3.5 --  CL 98 98 --  CO2 29 29 --  GLUCOSE 123* 133* --  BUN 6 5* --  CREATININE 0.66 0.74 --  CALCIUM 8.6 9.0 --  MG -- 2.0 1.6  PHOS -- -- --   No results found for this basename: WBC:2,NEUTROABS:2,HGB:2,HCT:2,MCV:2,PLT:2 in the last 72 hours  Basename 02/18/11 1118 02/18/11 0718 02/17/11 2009 02/17/11 1705 02/17/11 0721 02/17/11 0418  GLUCAP 135* 141* 155* 129* 130* 143*    Scheduled Meds:    . amLODipine  5 mg Oral Daily  . benazepril  10 mg Oral Daily  . ceftaroline (TEFLARO) 600 mg IVPB  600 mg Intravenous Q12H  . darifenacin  7.5 mg Oral Daily  . insulin aspart  0-3 Units Subcutaneous Q4H  . potassium chloride  20 mEq Oral BID  . senna  1 tablet Oral QHS  . sodium chloride  10 mL Intravenous Q12H  . sodium chloride      . sodium  chloride      . sodium chloride       Continuous Infusions:   PRN Meds:.acetaminophen, bisacodyl, diphenhydrAMINE, diphenhydrAMINE, HYDROcodone-acetaminophen, HYDROmorphone, ondansetron (ZOFRAN) IV, sodium chloride, sodium phosphate Assessment/Plan: Patient Active Hospital Problem List: Hematemesis/GI Blood Loss secondary to Pancreatic/Gastric/Duodenal Mass Stable. Outpatient referral to [facility] surgeons. Poor candidate for Whipple procedure.   Hypokalemia: She is being repleted. Her serum potassium is better peer  Abdominal Wall Cellulitis, now with drainage. Continue antibiotics.   H. pylori infection. Eventual treatment   Acute blood loss Anemia (01/26/2011):   Stable  Type 2 DM (09/13/2007):  Stable    Hypertension (09/13/2007):  Her blood pressure has trended up. We have restarted Norvasc and benazepril.    Deconditioning. The patient is likely going to need skilled nursing facility placement. Plan discharge tomorrow, pending bed availability and insurance coverage.    LOS: 23 days   [name] 02/18/2011, 2:20 PM

## 2011-02-19 LAB — CBC
HCT: 34.2 % — ABNORMAL LOW (ref 36.0–46.0)
Hemoglobin: 11.4 g/dL — ABNORMAL LOW (ref 12.0–15.0)
RBC: 3.88 MIL/uL (ref 3.87–5.11)
RDW: 14.6 % (ref 11.5–15.5)
WBC: 8.1 10*3/uL (ref 4.0–10.5)

## 2011-02-19 LAB — BASIC METABOLIC PANEL
BUN: 7 mg/dL (ref 6–23)
CO2: 27 mEq/L (ref 19–32)
Chloride: 98 mEq/L (ref 96–112)
GFR calc Af Amer: >60 mL/min (ref >60)
Potassium: 3.5 mEq/L (ref 3.5–5.1)

## 2011-02-19 LAB — HEPATIC FUNCTION PANEL
Bilirubin, Direct: <0.1 mg/dL (ref 0.0–0.3)
Total Bilirubin: 0.2 mg/dL — ABNORMAL LOW (ref 0.3–1.2)

## 2011-02-19 LAB — GLUCOSE, CAPILLARY
Glucose-Capillary: 126 mg/dL — ABNORMAL HIGH (ref 70–99)
Glucose-Capillary: 148 mg/dL — ABNORMAL HIGH (ref 70–99)

## 2011-02-19 MED ORDER — ENSURE PO LIQD
237.0000 mL | Freq: Three times a day (TID) | ORAL | Status: DC
  Administered 2011-02-19 – 2011-02-20 (×5): 237 mL via ORAL

## 2011-02-19 MED ORDER — POLYETHYLENE GLYCOL 3350 17 G PO PACK
17.0000 g | PACK | Freq: Every day | ORAL | Status: DC
  Administered 2011-02-19: 11:00:00 via ORAL
  Administered 2011-02-20: 17 g via ORAL
  Filled 2011-02-19: qty 1

## 2011-02-19 MED ORDER — BENAZEPRIL HCL 10 MG PO TABS
10.0000 mg | ORAL_TABLET | Freq: Two times a day (BID) | ORAL | Status: DC
  Administered 2011-02-19 – 2011-02-20 (×3): 10 mg via ORAL
  Filled 2011-02-19 (×4): qty 1

## 2011-02-19 MED ORDER — POLYETHYLENE GLYCOL 3350 17 G PO PACK
PACK | ORAL | Status: AC
  Filled 2011-02-19: qty 1

## 2011-02-19 NOTE — Progress Notes (Signed)
15 Days Post-Op  Subjective: No acute change.  Still limited PO but no C/o nausea.  Objective: Vital signs in last 24 hours: Temp:  [98.2 F (36.8 C)-99.4 F (37.4 C)] 98.2 F (36.8 C) (08/23 1400) Pulse Rate:  [84-120] 92  (08/23 1400) Resp:  [16-20] 18  (08/23 1400) BP: (133-174)/(74-94) 174/94 mmHg (08/23 1400) SpO2:  [95 %-98 %] 96 % (08/23 1400) Last BM Date: 02/14/11  Intake/Output from previous day: 08/22 0701 - 08/23 0700 In: 1330 [P.O.:580; IV Piggyback:750] Out: 650 [Urine:650] Intake/Output this shift:    General appearance: no distress GI: soft, non-tender; bowel sounds normal; no masses,  no organomegaly  Lab Results:  @LABLAST2 (wbc:2,hgb:2,hct:2,plt:2) BMET  Basename 02/19/11 0502 02/18/11 0448  NA 133* 136  K 3.5 3.3*  CL 98 98  CO2 27 29  GLUCOSE 127* 123*  BUN 7 6  CREATININE 0.72 0.66  CALCIUM 8.7 8.6   PT/INR No results found for this basename: LABPROT:2,INR:2 in the last 72 hours ABG No results found for this basename: PHART:2,PCO2:2,PO2:2,HCO3:2 in the last 72 hours  Studies/Results: No results found.  Anti-infectives: Anti-infectives     Start     Dose/Rate Route Frequency Ordered Stop   02/08/11 1000   vancomycin (VANCOCIN) IVPB 1000 mg/200 mL premix  Status:  Discontinued        1,000 mg 200 mL/hr over 60 Minutes Intravenous Every 12 hours 02/08/11 0810 02/11/11 1102   02/08/11 0800   levofloxacin (LEVAQUIN) IVPB 500 mg  Status:  Discontinued        500 mg 100 mL/hr over 60 Minutes Intravenous Every 24 hours 02/08/11 0756 02/11/11 1102          Assessment/Plan: s/p Procedure(s): EXPLORATORY LAPAROTOMY No evidence for new bleeing issue.  Continue PO and Bowel stim as tolerated.  LOS: 24 days    Yamir Carignan C 02/19/2011

## 2011-02-19 NOTE — Progress Notes (Signed)
FOLLOW-UP ADULT NUTRITION ASSESSMENT Date: 02/19/2011   Time: 11:37 AM Reason for Assessment: Incr nutritional risk  ASSESSMENT: Female 77 y.o.  Dx: Anemia  Hx:  Past Medical History  Diagnosis Date  . Hypertension   . Reflux   . Right elbow pain     OTIF  . Dementia   . Depression   . Osteoarthritis   . Pancreatitis 11/2007    HOP  . Angiomyolipoma of kidney     right  . Ulcerative esophagitis 12/05/2007    hx elevated gastrin, severe on EGD by Dr Jena Gauss , h pylori negative  . Hiatal hernia   . S/P colonoscopy 2009    pt reports normal by Dr Lovell Sheehan  . Diabetes mellitus   . Anemia     Scheduled Meds:    . amLODipine  5 mg Oral Daily  . benazepril  10 mg Oral BID  . ceftaroline (TEFLARO) 600 mg IVPB  600 mg Intravenous Q12H  . darifenacin  7.5 mg Oral Daily  . ENSURE  237 mL Oral TID WC  . insulin aspart  0-3 Units Subcutaneous Q4H  . polyethylene glycol  17 g Oral Daily  . polyethylene glycol      . potassium chloride  20 mEq Oral BID  . senna  1 tablet Oral QHS  . sodium chloride  10 mL Intravenous Q12H  . sodium chloride      . sodium chloride      . DISCONTD: benazepril  10 mg Oral Daily   Continuous Infusions:   PRN Meds:.acetaminophen, bisacodyl, diphenhydrAMINE, diphenhydrAMINE, HYDROcodone-acetaminophen, HYDROmorphone, ondansetron (ZOFRAN) IV, sodium chloride, sodium phosphate  Ht: 5' 6.5" (168.9 cm)  Wt: 206 lb 9.1 oz (93.7 kg)  Ideal Wt: 60.45 kg 64.95 kg % Ideal Wt: 154 %  Usual Wt: 182 # % Usual Wt: 112 %  Body mass index is 32.84 kg/(m^2).  Wt hx  02/08/2011 8/1- 182# 8/2- 202# 2.6 oz 8/3- 205# 4 oz 8/4 196# 3.4 oz 8/6 197# 8.5 oz 8/7 215# 9.8 oz 8/8 175# 14.8 oz 8/11 203# 0.7 oz 8/12 198# 10.2 oz 8/13 203# 14.8 oz 8/15 204# 2.3 oz 8/16 201# 4.5 oz 8/17 214# 15.2 oz    Food/Nutrition Related Hx: Pt able to provide limited nutrition hx. Reports usual body wt 182#. C/o abdominal pain. Received full liquid diet x4d. Po intake  50%. Pt needs assist with meals. Current diet NPO. Significant wt gain noted 12% above UBW. Pt high risk for malnutrition given incr LOS and limited po intake.  02/13/2011 Pt awake and in chair. Pt reports 100% intake at home. Current intake is 10-35% per chart and pt. Pt tolerates bland diet and appetite improving but still fair. Pt feeds herself. Current wt is 214# incr 13# since yesterday (8/16 wt 201#); may be due to fluid. Overall wt has been fairly stable per wt hx noted above. Pt remains at nutr risk due to limited po intake and LOS.  02/19/11 Pt awake and c/o of pain in her right side. Pt reports po intake varies. Pt reports that she likes the food and drinks being sent. Current po intake ranges from 5-100%. Pt would like to receive Glucerna for times when not feeling hungry. Will add Glucerna BID with meals. Wt has been requested for assessment of weight changes. Last wt was taken on 8/18 which was 206# 9.1oz; consistent with previous wts given the variability.  Labs: CBC    Component Value Date/Time   WBC 8.1 02/19/2011  0502   RBC 3.88 02/19/2011 0502   HGB 11.4* 02/19/2011 0502   HCT 34.2* 02/19/2011 0502   PLT 438* 02/19/2011 0502   MCV 88.1 02/19/2011 0502   MCH 29.4 02/19/2011 0502   MCHC 33.3 02/19/2011 0502   RDW 14.6 02/19/2011 0502   LYMPHSABS 1.6 01/26/2011 1952   MONOABS 1.2* 01/26/2011 1952   EOSABS 0.0 01/26/2011 1952   BASOSABS 0.0 01/26/2011 1952   BMET    Component Value Date/Time   NA 133* 02/19/2011 0502   K 3.5 02/19/2011 0502   CL 98 02/19/2011 0502   CO2 27 02/19/2011 0502   GLUCOSE 127* 02/19/2011 0502   BUN 7 02/19/2011 0502   CREATININE 0.72 02/19/2011 0502   CALCIUM 8.7 02/19/2011 0502   GFRNONAA >60 02/19/2011 0502   GFRAA >60 02/19/2011 0502      Intake/Output Summary (Last 24 hours) at 02/19/11 1137 Last data filed at 02/19/11 0840  Gross per 24 hour  Intake   1090 ml  Output    400 ml  Net    690 ml    Diet Order: Parke Simmers  Supplements/Tube Feeding: None  at this time  IVF:     Estimated Nutritional Needs:   Kcal:1300-1400 Protein:106-118 grams  Fluid:1300-1400 mL  NUTRITION DIAGNOSIS:   -Inadequate oral intake (NI-2.1).  Status: Ongoing  RELATED TO: altered GI function  AS EVIDENCE BY: GI bleed and duodenal lesion/pancreatic lesion   MONITORING/EVALUATION(Goals): - Meet > 75% energy and protein needs. 02/19/2011 Goal not met.  - Obtain/maintain UBW range. 02/19/2011 Goal not met. Current wt pending. - Will cont to monitor wt and po intake.   EDUCATION NEEDS: - Education not appropriate at this time  INTERVENTION: - Will evaluate for appropriate suppl when diet is adv.   02/13/2011 Due to wt maintenance will not recommend supplement at this time. 02/19/11 Will add Glucerna BID with meals.   Dietitian # [phone]  DOCUMENTATION CODES Per approved criteria  -Obesity Unspecified    Abe People 02/19/2011, 11:37 AM

## 2011-02-19 NOTE — Progress Notes (Signed)
Physical Therapy Treatment Patient Name: [name] Date: 02/19/2011 Problem List:  Patient Active Problem List  Diagnoses  . DIABETES MELLITUS, TYPE II  . HYPERLIPIDEMIA  . OBESITY, UNSPECIFIED  . DEPRESSION  . HYPERTENSION  . SINUSITIS, ACUTE  . ALLERGIC RHINITIS, SEASONAL  . OTHER CHRONIC BRONCHITIS  . DIVERTICULOSIS OF COLON  . ACUTE CYSTITIS  . INSOMNIA  . FATIGUE  . UNSTEADY GAIT  . HEADACHE  . OTHER URINARY INCONTINENCE  . CLOSED FRACTURE OF OLECRANON PROCESS OF ULNA  . HEMATOMA  . HEADACHES, HX OF  . Anorexia  . Anemia  . Rectal mass  . Syncope and collapse  . Hepatic steatosis  . Hyponatremia  . Gastric mass  . H pylori ulcer  . Ulcer, colon  . Acute renal failure  . S/P exploratory laparotomy  . Pericardial effusion  . Fever, postprocedural   Past Medical History:  Past Medical History  Diagnosis Date  . Hypertension   . Reflux   . Right elbow pain     OTIF  . Dementia   . Depression   . Osteoarthritis   . Pancreatitis 11/2007    HOP  . Angiomyolipoma of kidney     right  . Ulcerative esophagitis 12/05/2007    hx elevated gastrin, severe on EGD by Dr Jena Gauss , h pylori negative  . Hiatal hernia   . S/P colonoscopy 2009    pt reports normal by Dr Lovell Sheehan  . Diabetes mellitus   . Anemia    Past Surgical History:  Past Surgical History  Procedure Date  . Orif right hip 1999    [facility]  . Umbilical hernia repair 60 months old    [place]  . Esophagogastroduodenoscopy 01/28/2011    Procedure: ESOPHAGOGASTRODUODENOSCOPY (EGD);  Surgeon: Arlyce Harman, MD;  Location: [facility];  Service: Endoscopy;  Laterality: N/A;  . Colonoscopy 01/28/2011    Procedure: COLONOSCOPY;  Surgeon: Arlyce Harman, MD;  Location: [facility];  Service: Endoscopy;  Laterality: N/A;  . Laparotomy 02/04/2011    Procedure: EXPLORATORY LAPAROTOMY;  Surgeon: Fabio Bering;  Location: [facility] ORS;  Service: General;  Laterality: N/A;   Precautions/Restrictions    Precautions Precautions: Fall Required Braces or Orthoses: No Restrictions Weight Bearing Restrictions: No Mobility (including Balance) Bed Mobility Bed Mobility: Yes Left Sidelying to Sit: 7: Independent Supine to Sit: 7: Independent Transfers Sit to Stand: 6: Modified independent (Device/Increase time) Stand to Sit: 6: Modified independent (Device/Increase time) Ambulation/Gait Ambulation/Gait Assistance: 3: Mod assist Ambulation/Gait Assistance Details (indicate cue type and reason): gait unstable-has genu valgus with very short and rapid stride-had one aLOB and had to be stabilized-after ambulating 75', pt c/o feeling faint--she was able to get back to her bed to lie down--HR was up to150 BPM! --after resting about 4 minutes, her HR decreased to 88 and she was feeling better--we tried to do some gerneral strengthening exercise, but she felt too fatigued--RN was alerted to pt's HR Ambulation Distance (Feet): 150 Feet Assistive device: Rolling walker Gait Pattern: Decreased stride length;Decreased hip/knee flexion - right;Decreased hip/knee flexion - left;Trunk flexed (genu valgus bilaterally with increased lateral sway) Gait velocity: rapid pace with frequent cues to slow down Stairs: No Wheelchair Mobility Wheelchair Mobility: No  Posture/Postural Control Posture/Postural Control: No significant limitations Balance Balance Assessed:  (WNL) Exercise  Total Joint Exercises Heel Slides: AROM;5 reps;Both;Supine General Exercises - Lower Extremity Heel Slides: AROM;5 reps;Both;Supine Low Level/ICU Exercises Heel Slides: AROM;5 reps;Both;Supine  End of Session PT -  End of Session Equipment Utilized During Treatment: Gait belt Activity Tolerance: Patient limited by fatigue;Patient limited by pain Patient left: in chair;with call bell in reach Nurse Communication: Mobility status for transfers;Mobility status for ambulation (increased HR with gait) General Behavior During  Session: [facility] for tasks performed Cognition: [facility] for tasks performed PT Assessment/Plan  PT - Assessment/Plan Comments on Treatment Session:  pt is a very high fall risk--she doesn't feel well, having abdominal discomfort and rapid HR with activity--this causes her to try to rush back to her chair or bed to rest--her gati becomes very unstable--I feel strongly that she needs some time in SNF in order to recuperate more from surgery PT Plan: Discharge plan remains appropriate PT Frequency: Min 3X/week Follow Up Recommendations: Skilled nursing facility Equipment Recommended: Defer to next venue PT Goals  Acute Rehab PT Goals Pt will Ambulate: 51 - 150 feet;with supervision;with rolling walker;with cues (comment type and amount);Other (comment) (with no LOB or cues needed to slow pace) PT Goal: Ambulate - Progress: Revised (modified due to lack of progress/goal met)  [name] 02/19/2011, 11:15 AM

## 2011-02-19 NOTE — Progress Notes (Signed)
Subjective: She is lying in bed, complaining of right-sided abdominal pain. She denies nausea and vomiting. She remembers her last bowel movement was approximately 2 days ago. She is receiving Vicodin on a regular basis. She required Dilaudid  twice yesterday according to the nurse.  Objective: Vital signs in last 24 hours: Filed Vitals:   02/18/11 1500 02/18/11 2141 02/19/11 0548 02/19/11 1054  BP: 138/67 133/74 159/91   Pulse: 89 84 120 86  Temp: 99.4 F (37.4 C) 99 F (37.2 C) 99.4 F (37.4 C)   TempSrc: Oral Oral    Resp: 18 16 20    Height:      Weight:      SpO2: 91% 98% 98% 95%   Weight change:   Intake/Output Summary (Last 24 hours) at 02/19/11 1136 Last data filed at 02/19/11 0840  Gross per 24 hour  Intake   1090 ml  Output    400 ml  Net    690 ml   General appearance: Lying in bed, complaining of right-sided abdominal pain. Lungs: Occasional crackles heard in the bases. Breathing is nonlabored. Heart:: S1-S2 with mild tachycardia and soft systolic murmur. Abdomen: soft; nondistended, mild tenderness over the right upper quadrant and right lower quadrant but soft abdomen; staples in place, serosanguineous drainage around the first and second staples sites. Less erythema.. The dressing is with some drainage. Extremities: Trace of pedal edema.  Lab Results: Basic Metabolic Panel:  Basename 02/19/11 0502 02/18/11 0448 02/17/11 0606  NA 133* 136 --  K 3.5 3.3* --  CL 98 98 --  CO2 27 29 --  GLUCOSE 127* 123* --  BUN 7 6 --  CREATININE 0.72 0.66 --  CALCIUM 8.7 8.6 --  MG 1.7 -- 2.0  PHOS -- -- --    Basename 02/19/11 0502  WBC 8.1  NEUTROABS --  HGB 11.4*  HCT 34.2*  MCV 88.1  PLT 438*    Basename 02/19/11 0625 02/19/11 0016 02/18/11 2021 02/18/11 1628 02/18/11 1118 02/18/11 0718  GLUCAP 144* 126* 134* 112* 135* 141*    Scheduled Meds:    . amLODipine  5 mg Oral Daily  . benazepril  10 mg Oral BID  . ceftaroline (TEFLARO) 600 mg IVPB  600 mg  Intravenous Q12H  . darifenacin  7.5 mg Oral Daily  . ENSURE  237 mL Oral TID WC  . insulin aspart  0-3 Units Subcutaneous Q4H  . polyethylene glycol  17 g Oral Daily  . polyethylene glycol      . potassium chloride  20 mEq Oral BID  . senna  1 tablet Oral QHS  . sodium chloride  10 mL Intravenous Q12H  . sodium chloride      . sodium chloride      . DISCONTD: benazepril  10 mg Oral Daily   Continuous Infusions:   PRN Meds:.acetaminophen, bisacodyl, diphenhydrAMINE, diphenhydrAMINE, HYDROcodone-acetaminophen, HYDROmorphone, ondansetron (ZOFRAN) IV, sodium chloride, sodium phosphate Assessment/Plan:  Hematemesis/GI Blood Loss secondary to Pancreatic/Gastric/Duodenal Mass She is complaining of right-sided abdominal pain. We will order a lipase level and hepatic function panel.   Status post embolization. Outpatient referral to [facility] surgeons, if needed. If needed, this will be arranged by Dr. Leticia Penna. Poor candidate for Whipple procedure.    Hypokalemia: She is being repleted.   Borderline hypomagnesemia: Status post IV magnesium sulfate.  Abdominal Wall Cellulitis, now with drainage. Continue Teflaro for now. Consider discontinuing it in the next day or 2.  H. pylori infection. Eventual treatment. Will start Pylera  once there is a steady state of fairly good oral intake of meals. This was discussed with gastroenterologist, Dr. Jena Gauss.   Acute blood loss Anemia (01/26/2011):   Stable  Type 2 DM (09/13/2007):  Stable    Hypertension (09/13/2007):  Her blood pressure has trended up.  Benazepril and Norvasc were restarted. IV fluids were tapered off. She is mildly tachycardic. The elevation in her blood pressure and heart rate may be secondary to pain.   Deconditioning. The patient is likely going to need skilled nursing facility placement. Plan discharge, pending bed availability and insurance coverage.    LOS: 24 days   [name] 02/19/2011, 11:36 AM

## 2011-02-19 NOTE — Consult Note (Signed)
CSW has been working with SNF to receive insurance authorization. Pt has bed offers.  Per Suann Larry, [employer] requested today's PT notes in order to make decision as it appeared previous notes did not indicate need for SNF and that pt could be managed at home.  CSW to continue to follow.    Karn Cassis

## 2011-02-20 LAB — GLUCOSE, CAPILLARY
Glucose-Capillary: 120 mg/dL — ABNORMAL HIGH (ref 70–99)
Glucose-Capillary: 134 mg/dL — ABNORMAL HIGH (ref 70–99)
Glucose-Capillary: 142 mg/dL — ABNORMAL HIGH (ref 70–99)
Glucose-Capillary: 146 mg/dL — ABNORMAL HIGH (ref 70–99)

## 2011-02-20 MED ORDER — GLIPIZIDE 10 MG PO TABS: ORAL_TABLET | ORAL | Status: DC

## 2011-02-20 MED ORDER — ESOMEPRAZOLE MAGNESIUM 40 MG PO CPDR: DELAYED_RELEASE_CAPSULE | ORAL | Status: DC

## 2011-02-20 MED ORDER — HYDROCODONE-ACETAMINOPHEN 5-325 MG PO TABS: 1.0000 | ORAL_TABLET | ORAL | Status: DC | PRN

## 2011-02-20 MED ORDER — METFORMIN HCL 1000 MG PO TABS: ORAL_TABLET | ORAL | Status: DC

## 2011-02-20 MED ORDER — POTASSIUM CHLORIDE CRYS ER 20 MEQ PO TBCR: 20.0000 meq | EXTENDED_RELEASE_TABLET | Freq: Every day | ORAL | Status: DC

## 2011-02-20 MED ORDER — POLYETHYLENE GLYCOL 3350 17 G PO PACK: PACK | ORAL | Status: DC

## 2011-02-20 NOTE — Progress Notes (Signed)
16 Days Post-Op  Subjective: Still with some L sided abd pain.  No change with PO, or BM.  Last BM yesterday.  Normal.  No fevers or chills.  States eating fine to me but per report, limited PO.  Objective: Vital signs in last 24 hours: Temp:  [98.2 F (36.8 C)-100 F (37.8 C)] 99.2 F (37.3 C) (08/24 0512) Pulse Rate:  [86-110] 98  (08/24 0512) Resp:  [16-20] 20  (08/24 0512) BP: (150-174)/(76-94) 172/76 mmHg (08/24 0512) SpO2:  [93 %-97 %] 94 % (08/24 0512) FiO2 (%):  [21 %] 21 % (08/23 2127) Last BM Date: 02/14/11  Intake/Output from previous day: 08/23 0701 - 08/24 0700 In: 860 [P.O.:360; IV Piggyback:500] Out: 550 [Urine:550] Intake/Output this shift: I/O this shift: In: 560 [P.O.:560] Out: -   General appearance: alert and no distress GI: +BS, soft, mild mid line tenderness.  No errythema.  Serosang d/c from upper ascpect of midline inc.    Lab Results:  @LABLAST2 (wbc:2,hgb:2,hct:2,plt:2) BMET  Basename 02/19/11 0502 02/18/11 0448  NA 133* 136  K 3.5 3.3*  CL 98 98  CO2 27 29  GLUCOSE 127* 123*  BUN 7 6  CREATININE 0.72 0.66  CALCIUM 8.7 8.6   PT/INR No results found for this basename: LABPROT:2,INR:2 in the last 72 hours ABG No results found for this basename: PHART:2,PCO2:2,PO2:2,HCO3:2 in the last 72 hours  Studies/Results: No results found.  Anti-infectives: Anti-infectives     Start     Dose/Rate Route Frequency Ordered Stop   02/08/11 1000   vancomycin (VANCOCIN) IVPB 1000 mg/200 mL premix  Status:  Discontinued        1,000 mg 200 mL/hr over 60 Minutes Intravenous Every 12 hours 02/08/11 0810 02/11/11 1102   02/08/11 0800   levofloxacin (LEVAQUIN) IVPB 500 mg  Status:  Discontinued        500 mg 100 mL/hr over 60 Minutes Intravenous Every 24 hours 02/08/11 0756 02/11/11 1102          Assessment/Plan: s/p Procedure(s): EXPLORATORY LAPAROTOMY Can d/c IV abx at this point.  +/- po abx for mid line wound infection.  At his point consider  just local care to the area.  Continue Po as tolerated.    LOS: 25 days    Davan Nawabi C 02/20/2011

## 2011-02-20 NOTE — Consult Note (Signed)
CSW and CM spoke with pt at bedside regarding d/c plan.  Pt walked 200 ft with walker this morning and insurance had already planned to deny her claim for SNF when she was walking 150 ft.  CSW discussed ALF, but pt states she cannot give up her check as she has an apartment.  CM spoke with pt's aid and they agreed to increase number of hours to 30 per week.  Pt will also have home health.  Pt feels comfortable with this plan.  CSW to sign off.  Karn Cassis

## 2011-02-20 NOTE — Progress Notes (Signed)
Pt does not qualify for snf due to now ambulating 200 ft. Pt will be d/c home today. Cm spoke to cap case worker Alinda Money who said she can increase pts aide hours to 30 hours a day. Arranged hh services through [facility] to start tomorrow with rn and pt. Arranged for rolling walker for pt. Spoke to pts step daughter who is unable to provide pt transportation home. Refer to sw to arranged pt to have a cab. Pt has followuip appointment with dr Lodema Hong.

## 2011-02-20 NOTE — Progress Notes (Signed)
Physical Therapy Treatment Patient Name: [name] Date: 02/20/2011 Problem List:  Patient Active Problem List  Diagnoses  . DIABETES MELLITUS, TYPE II  . HYPERLIPIDEMIA  . OBESITY, UNSPECIFIED  . DEPRESSION  . HYPERTENSION  . SINUSITIS, ACUTE  . ALLERGIC RHINITIS, SEASONAL  . OTHER CHRONIC BRONCHITIS  . DIVERTICULOSIS OF COLON  . ACUTE CYSTITIS  . INSOMNIA  . FATIGUE  . UNSTEADY GAIT  . HEADACHE  . OTHER URINARY INCONTINENCE  . CLOSED FRACTURE OF OLECRANON PROCESS OF ULNA  . HEMATOMA  . HEADACHES, HX OF  . Anorexia  . Anemia  . Rectal mass  . Syncope and collapse  . Hepatic steatosis  . Hyponatremia  . Gastric mass  . H pylori ulcer  . Ulcer, colon  . Acute renal failure  . S/P exploratory laparotomy  . Pericardial effusion  . Fever, postprocedural   Past Medical History:  Past Medical History  Diagnosis Date  . Hypertension   . Reflux   . Right elbow pain     OTIF  . Dementia   . Depression   . Osteoarthritis   . Pancreatitis 11/2007    [facility]  . Angiomyolipoma of kidney     right  . Ulcerative esophagitis 12/05/2007    hx elevated gastrin, severe on EGD by Dr Jena Gauss , h pylori negative  . Hiatal hernia   . S/P colonoscopy 2009    pt reports normal by Dr Lovell Sheehan  . Diabetes mellitus   . Anemia    Past Surgical History:  Past Surgical History  Procedure Date  . Orif right hip 1999    [facility]  . Umbilical hernia repair 1 months old    [place]  . Esophagogastroduodenoscopy 01/28/2011    Procedure: ESOPHAGOGASTRODUODENOSCOPY (EGD);  Surgeon: Arlyce Harman, MD;  [facility];  Service: Endoscopy;  Laterality: N/A;  . Colonoscopy 01/28/2011    Procedure: COLONOSCOPY;  Surgeon: Arlyce Harman, MD;  [facility];  Service: Endoscopy;  Laterality: N/A;  . Laparotomy 02/04/2011    Procedure: EXPLORATORY LAPAROTOMY;  Surgeon: Fabio Bering;  Location: [facility];  Service: General;  Laterality: N/A;   Precautions/Restrictions    Precautions Precautions: Fall Required Braces or Orthoses: No Restrictions Weight Bearing Restrictions: No Mobility (including Balance) Bed Mobility Left Sidelying to Sit: 7: Independent Supine to Sit: 7: Independent Transfers Sit to Stand: 6: Modified independent (Device/Increase time) Stand to Sit: 6: Modified independent (Device/Increase time) Ambulation/Gait Ambulation/Gait Assistance: 6: Modified independent (Device/Increase time) Ambulation/Gait Assistance Details (indicate cue type and reason): no LOB today-several cues needed to slow pace,but gait is stable-HR to 135 Ambulation Distance (Feet): 200 Feet Assistive device: Rolling walker Stairs: No Wheelchair Mobility Wheelchair Mobility: No    Exercise  Total Joint Exercises Heel Slides: AROM;Both;10 reps;Supine Straight Leg Raises: AROM;Both;10 reps;Supine General Exercises - Lower Extremity Heel Slides: AROM;Both;10 reps;Supine Straight Leg Raises: AROM;Both;10 reps;Supine Low Level/ICU Exercises Heel Slides: AROM;Both;10 reps;Supine Other Exercises Other Exercises: bridging x 5 reps Other Exercises: sit to stand with hand support x 5 reps  End of Session PT - End of Session Equipment Utilized During Treatment: Gait belt Activity Tolerance: Patient tolerated treatment well Patient left: in bed;with call bell in reach;with bed alarm set General Behavior During Session: [facility] for tasks performed Cognition: [facility] for tasks performed PT Assessment/Plan  PT - Assessment/Plan Comments on Treatment Session: better tolerance to PT today PT Plan: Discharge plan remains appropriate PT Goals  Acute Rehab PT Goals PT Goal:  Ambulate - Progress: Progressing toward goal  [name] 02/20/2011, 11:39 AM

## 2011-02-21 ENCOUNTER — Emergency Department (HOSPITAL_COMMUNITY): Payer: Medicare HMO

## 2011-02-21 ENCOUNTER — Inpatient Hospital Stay (HOSPITAL_COMMUNITY)
Admission: EM | Admit: 2011-02-21 | Discharge: 2011-02-27 | Disposition: A | Discharge status: Skilled Nursing Facility | Payer: Medicare HMO | Source: Home / Self Care | Attending: Internal Medicine | Admitting: Internal Medicine

## 2011-02-21 ENCOUNTER — Encounter (HOSPITAL_COMMUNITY): Payer: Self-pay | Admitting: Emergency Medicine

## 2011-02-21 ENCOUNTER — Emergency Department (HOSPITAL_COMMUNITY)
Admission: EM | Admit: 2011-02-21 | Discharge: 2011-02-21 | Disposition: A | Discharge status: Home / Self Care | Payer: Medicare HMO | Source: Home / Self Care | Attending: Emergency Medicine | Admitting: Emergency Medicine

## 2011-02-21 ENCOUNTER — Other Ambulatory Visit: Payer: Self-pay

## 2011-02-21 ENCOUNTER — Encounter (HOSPITAL_COMMUNITY): Payer: Self-pay | Admitting: *Deleted

## 2011-02-21 DIAGNOSIS — K633 Ulcer of intestine: Secondary | ICD-10-CM

## 2011-02-21 DIAGNOSIS — N179 Acute kidney failure, unspecified: Secondary | ICD-10-CM

## 2011-02-21 DIAGNOSIS — N3000 Acute cystitis without hematuria: Secondary | ICD-10-CM

## 2011-02-21 DIAGNOSIS — R112 Nausea with vomiting, unspecified: Secondary | ICD-10-CM | POA: Insufficient documentation

## 2011-02-21 DIAGNOSIS — F3289 Other specified depressive episodes: Secondary | ICD-10-CM

## 2011-02-21 DIAGNOSIS — Z87898 Personal history of other specified conditions: Secondary | ICD-10-CM

## 2011-02-21 DIAGNOSIS — K760 Fatty (change of) liver, not elsewhere classified: Secondary | ICD-10-CM

## 2011-02-21 DIAGNOSIS — K5730 Diverticulosis of large intestine without perforation or abscess without bleeding: Secondary | ICD-10-CM

## 2011-02-21 DIAGNOSIS — R6883 Chills (without fever): Secondary | ICD-10-CM | POA: Insufficient documentation

## 2011-02-21 DIAGNOSIS — S52023A Displaced fracture of olecranon process without intraarticular extension of unspecified ulna, initial encounter for closed fracture: Secondary | ICD-10-CM

## 2011-02-21 DIAGNOSIS — R630 Anorexia: Secondary | ICD-10-CM

## 2011-02-21 DIAGNOSIS — I3139 Other pericardial effusion (noninflammatory): Secondary | ICD-10-CM

## 2011-02-21 DIAGNOSIS — E119 Type 2 diabetes mellitus without complications: Secondary | ICD-10-CM | POA: Diagnosis present

## 2011-02-21 DIAGNOSIS — I498 Other specified cardiac arrhythmias: Secondary | ICD-10-CM | POA: Insufficient documentation

## 2011-02-21 DIAGNOSIS — N39498 Other specified urinary incontinence: Secondary | ICD-10-CM | POA: Diagnosis present

## 2011-02-21 DIAGNOSIS — R269 Unspecified abnormalities of gait and mobility: Secondary | ICD-10-CM | POA: Diagnosis present

## 2011-02-21 DIAGNOSIS — J42 Unspecified chronic bronchitis: Secondary | ICD-10-CM

## 2011-02-21 DIAGNOSIS — I313 Pericardial effusion (noninflammatory): Secondary | ICD-10-CM

## 2011-02-21 DIAGNOSIS — I10 Essential (primary) hypertension: Secondary | ICD-10-CM | POA: Diagnosis present

## 2011-02-21 DIAGNOSIS — Z9889 Other specified postprocedural states: Secondary | ICD-10-CM

## 2011-02-21 DIAGNOSIS — B9681 Helicobacter pylori [H. pylori] as the cause of diseases classified elsewhere: Secondary | ICD-10-CM

## 2011-02-21 DIAGNOSIS — R109 Unspecified abdominal pain: Secondary | ICD-10-CM | POA: Insufficient documentation

## 2011-02-21 DIAGNOSIS — E785 Hyperlipidemia, unspecified: Secondary | ICD-10-CM | POA: Diagnosis present

## 2011-02-21 DIAGNOSIS — Z794 Long term (current) use of insulin: Secondary | ICD-10-CM | POA: Insufficient documentation

## 2011-02-21 DIAGNOSIS — Z862 Personal history of diseases of the blood and blood-forming organs and certain disorders involving the immune mechanism: Secondary | ICD-10-CM | POA: Insufficient documentation

## 2011-02-21 DIAGNOSIS — J019 Acute sinusitis, unspecified: Secondary | ICD-10-CM

## 2011-02-21 DIAGNOSIS — Z79899 Other long term (current) drug therapy: Secondary | ICD-10-CM | POA: Insufficient documentation

## 2011-02-21 DIAGNOSIS — R410 Disorientation, unspecified: Secondary | ICD-10-CM | POA: Diagnosis present

## 2011-02-21 DIAGNOSIS — T148XXA Other injury of unspecified body region, initial encounter: Secondary | ICD-10-CM

## 2011-02-21 DIAGNOSIS — R51 Headache: Secondary | ICD-10-CM

## 2011-02-21 DIAGNOSIS — F329 Major depressive disorder, single episode, unspecified: Secondary | ICD-10-CM

## 2011-02-21 DIAGNOSIS — R1013 Epigastric pain: Secondary | ICD-10-CM

## 2011-02-21 DIAGNOSIS — D649 Anemia, unspecified: Secondary | ICD-10-CM | POA: Diagnosis present

## 2011-02-21 DIAGNOSIS — R5082 Postprocedural fever: Secondary | ICD-10-CM

## 2011-02-21 DIAGNOSIS — R110 Nausea: Secondary | ICD-10-CM

## 2011-02-21 DIAGNOSIS — J301 Allergic rhinitis due to pollen: Secondary | ICD-10-CM

## 2011-02-21 DIAGNOSIS — E871 Hypo-osmolality and hyponatremia: Secondary | ICD-10-CM | POA: Diagnosis present

## 2011-02-21 DIAGNOSIS — D72829 Elevated white blood cell count, unspecified: Secondary | ICD-10-CM | POA: Diagnosis present

## 2011-02-21 DIAGNOSIS — E669 Obesity, unspecified: Secondary | ICD-10-CM | POA: Diagnosis present

## 2011-02-21 DIAGNOSIS — R55 Syncope and collapse: Secondary | ICD-10-CM

## 2011-02-21 DIAGNOSIS — G4700 Insomnia, unspecified: Secondary | ICD-10-CM

## 2011-02-21 DIAGNOSIS — K3189 Other diseases of stomach and duodenum: Secondary | ICD-10-CM | POA: Diagnosis present

## 2011-02-21 DIAGNOSIS — R5381 Other malaise: Secondary | ICD-10-CM

## 2011-02-21 LAB — CBC
HCT: 32.5 % — ABNORMAL LOW (ref 36.0–46.0)
HCT: 31 % — ABNORMAL LOW (ref 36.0–46.0)
Hemoglobin: 11.1 g/dL — ABNORMAL LOW (ref 12.0–15.0)
MCH: 30.2 pg (ref 26.0–34.0)
MCHC: 34.2 g/dL (ref 30.0–36.0)
MCHC: 34.2 g/dL (ref 30.0–36.0)
Platelets: 477 10*3/uL — ABNORMAL HIGH (ref 150–400)
RDW: 14.7 % (ref 11.5–15.5)
RDW: 14.7 % (ref 11.5–15.5)
WBC: 15.7 10*3/uL — ABNORMAL HIGH (ref 4.0–10.5)

## 2011-02-21 LAB — BASIC METABOLIC PANEL
BUN: 17 mg/dL (ref 6–23)
Calcium: 8.4 mg/dL (ref 8.4–10.5)
Creatinine, Ser: 0.95 mg/dL (ref 0.50–1.10)
GFR calc Af Amer: >60 mL/min (ref >60)
GFR calc non Af Amer: 57 mL/min — ABNORMAL LOW (ref >60)

## 2011-02-21 LAB — COMPREHENSIVE METABOLIC PANEL
ALT: 9 U/L (ref 0–35)
AST: 11 U/L (ref 0–37)
Albumin: 2.4 g/dL — ABNORMAL LOW (ref 3.5–5.2)
Chloride: 93 mEq/L — ABNORMAL LOW (ref 96–112)
Creatinine, Ser: 0.94 mg/dL (ref 0.50–1.10)
Sodium: 130 mEq/L — ABNORMAL LOW (ref 135–145)
Total Bilirubin: 0.2 mg/dL — ABNORMAL LOW (ref 0.3–1.2)

## 2011-02-21 LAB — URINALYSIS, ROUTINE W REFLEX MICROSCOPIC
Glucose, UA: NEGATIVE mg/dL
Hgb urine dipstick: NEGATIVE
Leukocytes, UA: NEGATIVE
pH: 6 (ref 5.0–8.0)

## 2011-02-21 LAB — DIFFERENTIAL
Basophils Absolute: 0 10*3/uL (ref 0.0–0.1)
Basophils Absolute: 0 10*3/uL (ref 0.0–0.1)
Basophils Relative: 0 % (ref 0–1)
Basophils Relative: 0 % (ref 0–1)
Eosinophils Absolute: 0.1 10*3/uL (ref 0.0–0.7)
Eosinophils Relative: 1 % (ref 0–5)
Lymphocytes Relative: 10 % — ABNORMAL LOW (ref 12–46)
Monocytes Absolute: 0.6 10*3/uL (ref 0.1–1.0)
Monocytes Absolute: 0.6 10*3/uL (ref 0.1–1.0)
Monocytes Relative: 5 % (ref 3–12)
Neutro Abs: 10.8 10*3/uL — ABNORMAL HIGH (ref 1.7–7.7)
Neutro Abs: 13.6 10*3/uL — ABNORMAL HIGH (ref 1.7–7.7)
Neutrophils Relative %: 87 % — ABNORMAL HIGH (ref 43–77)

## 2011-02-21 MED ORDER — BISACODYL 10 MG RE SUPP: 10.0000 mg | RECTAL | Status: DC | PRN

## 2011-02-21 MED ORDER — OXYCODONE HCL 5 MG PO TABS
5.0000 mg | ORAL_TABLET | ORAL | Status: DC | PRN
  Administered 2011-02-22 (×3): 5 mg via ORAL
  Filled 2011-02-21 (×3): qty 1

## 2011-02-21 MED ORDER — VANCOMYCIN HCL IN DEXTROSE 1-5 GM/200ML-% IV SOLN
1000.0000 mg | Freq: Once | INTRAVENOUS | Status: AC
  Administered 2011-02-21: 1000 mg via INTRAVENOUS
  Filled 2011-02-21: qty 200

## 2011-02-21 MED ORDER — ACETAMINOPHEN 650 MG RE SUPP: 650.0000 mg | Freq: Four times a day (QID) | RECTAL | Status: DC | PRN

## 2011-02-21 MED ORDER — ONDANSETRON HCL 4 MG/2ML IJ SOLN
4.0000 mg | Freq: Once | INTRAMUSCULAR | Status: AC
  Administered 2011-02-21: 4 mg via INTRAVENOUS
  Filled 2011-02-21: qty 2

## 2011-02-21 MED ORDER — INSULIN ASPART 100 UNIT/ML ~~LOC~~ SOLN
0.0000 [IU] | Freq: Every day | SUBCUTANEOUS | Status: DC
  Administered 2011-02-23: 2 [IU] via SUBCUTANEOUS

## 2011-02-21 MED ORDER — INSULIN ASPART 100 UNIT/ML ~~LOC~~ SOLN
0.0000 [IU] | Freq: Three times a day (TID) | SUBCUTANEOUS | Status: DC
  Administered 2011-02-22 (×3): 2 [IU] via SUBCUTANEOUS
  Administered 2011-02-23 – 2011-02-24 (×3): 1 [IU] via SUBCUTANEOUS
  Administered 2011-02-24: 2 [IU] via SUBCUTANEOUS
  Administered 2011-02-27: 1 [IU] via SUBCUTANEOUS
  Filled 2011-02-21 (×2): qty 3

## 2011-02-21 MED ORDER — SODIUM CHLORIDE 0.9 % IJ SOLN
INTRAMUSCULAR | Status: AC
  Administered 2011-02-21: 3 mL
  Filled 2011-02-21: qty 3

## 2011-02-21 MED ORDER — ONDANSETRON HCL 4 MG PO TABS: 4.0000 mg | ORAL_TABLET | Freq: Four times a day (QID) | ORAL | Status: DC

## 2011-02-21 MED ORDER — IOHEXOL 300 MG/ML  SOLN
100.0000 mL | Freq: Once | INTRAMUSCULAR | Status: AC | PRN
  Administered 2011-02-21: 100 mL via INTRAVENOUS

## 2011-02-21 MED ORDER — HYDROMORPHONE HCL 1 MG/ML IJ SOLN
1.0000 mg | Freq: Once | INTRAMUSCULAR | Status: AC
  Administered 2011-02-21: 1 mg via INTRAVENOUS
  Filled 2011-02-21: qty 1

## 2011-02-21 MED ORDER — SODIUM CHLORIDE 0.9 % IV BOLUS (SEPSIS)
500.0000 mL | Freq: Once | INTRAVENOUS | Status: AC
  Administered 2011-02-21: 500 mL via INTRAVENOUS

## 2011-02-21 MED ORDER — ONDANSETRON HCL 4 MG PO TABS: 4.0000 mg | ORAL_TABLET | Freq: Four times a day (QID) | ORAL | Status: DC | PRN

## 2011-02-21 MED ORDER — POLYETHYLENE GLYCOL 3350 17 G PO PACK: 17.0000 g | PACK | Freq: Every day | ORAL | Status: DC | PRN

## 2011-02-21 MED ORDER — SODIUM CHLORIDE 0.9 % IV SOLN: Freq: Once | INTRAVENOUS | Status: DC

## 2011-02-21 MED ORDER — SODIUM CHLORIDE 0.9 % IV SOLN
INTRAVENOUS | Status: AC
  Administered 2011-02-22: via INTRAVENOUS

## 2011-02-21 MED ORDER — FOLIC ACID 1 MG PO TABS
1.0000 mg | ORAL_TABLET | Freq: Every day | ORAL | Status: DC
  Administered 2011-02-22: 1 mg via ORAL
  Filled 2011-02-21: qty 1

## 2011-02-21 MED ORDER — TRAZODONE HCL 50 MG PO TABS
25.0000 mg | ORAL_TABLET | Freq: Every evening | ORAL | Status: DC | PRN
  Administered 2011-02-24: 25 mg via ORAL
  Filled 2011-02-21: qty 1

## 2011-02-21 MED ORDER — HYDROMORPHONE HCL 1 MG/ML IJ SOLN
0.5000 mg | INTRAMUSCULAR | Status: DC | PRN
  Administered 2011-02-22 (×4): 0.5 mg via INTRAVENOUS
  Filled 2011-02-21 (×4): qty 1

## 2011-02-21 MED ORDER — DOXYCYCLINE HYCLATE 100 MG PO TABS: 100.0000 mg | ORAL_TABLET | Freq: Two times a day (BID) | ORAL | Status: DC

## 2011-02-21 MED ORDER — ONDANSETRON HCL 4 MG/2ML IJ SOLN
4.0000 mg | Freq: Four times a day (QID) | INTRAMUSCULAR | Status: DC | PRN
  Administered 2011-02-22 – 2011-02-23 (×3): 4 mg via INTRAVENOUS
  Filled 2011-02-21 (×3): qty 2

## 2011-02-21 MED ORDER — ACETAMINOPHEN 325 MG PO TABS
650.0000 mg | ORAL_TABLET | Freq: Four times a day (QID) | ORAL | Status: DC | PRN
  Administered 2011-02-27: 650 mg via ORAL
  Filled 2011-02-21: qty 2

## 2011-02-21 MED ORDER — FLEET ENEMA 7-19 GM/118ML RE ENEM: 1.0000 | ENEMA | RECTAL | Status: DC | PRN

## 2011-02-21 MED ORDER — MORPHINE SULFATE 4 MG/ML IJ SOLN
4.0000 mg | Freq: Once | INTRAMUSCULAR | Status: AC
  Administered 2011-02-21: 4 mg via INTRAVENOUS
  Filled 2011-02-21: qty 1

## 2011-02-21 NOTE — ED Notes (Signed)
[phone], spoke with Karel Jarvis, they are going to send a nurse out to evaluate her.

## 2011-02-21 NOTE — ED Provider Notes (Signed)
History     CSN: [id number] Arrival date & time: 02/21/2011  3:06 PM Scribed for Dione Booze, MD, the patient was seen in room [facility]03[facility]. This chart was scribed by Katha Cabal.    Chief Complaint  Patient presents with  . Weakness   HPI [name] is a 77 y.o. female who presents to the Emergency Department complaining of epigastric abdominal pain, 8/10 currently and 8/10 at worsewith associated loss of appetite,  weakness, vomiting (x1 resolved), SOB, fever onset today. Denies diarrhea, constipation.  Pt stated she felt better after vomiting. Notes recent abdominal surgery and was seen earlier today in ED.  Pt tolerated juice this am.     HPI ELEMENTS:  Location: epigastric Onset: today Duration: persistent since onset  Severity: 8/10, 8/10 at worse Context: as above  Associated symptoms: loss of appetite,  weakness, vomiting (x1 resolved), SOB, fever onset today. Denies diarrhea, constipation.  PAST MEDICAL HISTORY:  Past Medical History  Diagnosis Date  . Hypertension   . Reflux   . Right elbow pain     OTIF  . Dementia   . Depression   . Osteoarthritis   . Pancreatitis 11/2007    [facility]  . Angiomyolipoma of kidney     right  . Ulcerative esophagitis 12/05/2007    hx elevated gastrin, severe on EGD by Dr Jena Gauss , h pylori negative  . Hiatal hernia   . S/P colonoscopy 2009    pt reports normal by Dr Lovell Sheehan  . Diabetes mellitus   . Anemia     PAST SURGICAL HISTORY:  Past Surgical History  Procedure Date  . Orif right hip 1999    [facility]  . Umbilical hernia repair 11 months old    [place]  . Esophagogastroduodenoscopy 01/28/2011    Procedure: ESOPHAGOGASTRODUODENOSCOPY (EGD);  Surgeon: Arlyce Harman, MD;  [facility];  Service: Endoscopy;  Laterality: N/A;  . Colonoscopy 01/28/2011    Procedure: COLONOSCOPY;  Surgeon: Arlyce Harman, MD;  [facility];  Service: Endoscopy;  Laterality: N/A;  . Laparotomy 02/04/2011    Procedure: EXPLORATORY  LAPAROTOMY;  Surgeon: Fabio Bering;  Location: [facility];  Service: General;  Laterality: N/A;    MEDICATIONS:  Previous Medications   AMLODIPINE (NORVASC) 5 MG TABLET    Take 1 tablet (5 mg total) by mouth daily. Take one tablet by mouth once a day    B-D ULTRAFINE III SHORT PEN 31G X 8 MM MISC    USE AS DIRECTED.   BENAZEPRIL (LOTENSIN) 20 MG TABLET    Take 1 tablet (20 mg total) by mouth daily.   CALCIUM-VITAMIN D (OSCAL WITH D) 500-200 MG-UNIT PER TABLET    Take 1 tablet by mouth daily.     DARIFENACIN (ENABLEX) 15 MG 24 HR TABLET    Take 15 mg by mouth daily.    DOXYCYCLINE (VIBRA-TABS) 100 MG TABLET    Take 1 tablet (100 mg total) by mouth 2 (two) times daily.   ESOMEPRAZOLE (NEXIUM) 40 MG CAPSULE    Take 40 mg by mouth 2 (two) times daily.     GLIPIZIDE (GLUCOTROL) 10 MG TABLET    Take 10 mg by mouth daily. THE DOSE HAS BEEN REDUCED TO 1 TABLET ONCE DAILY.  DO NOT TAKE THIS MEDICINE IF YOUR BLOOD SUGAR IS LESS THAN 130.    GLUCOSE BLOOD (ONE TOUCH TEST STRIPS) TEST STRIP    1 each by Other route 3 (three) times daily. Use as  directed three times a day    HYDROCODONE-ACETAMINOPHEN (NORCO) 5-325 MG PER TABLET    Take 1 tablet by mouth every 4 (four) hours as needed for pain.   INSULIN GLARGINE (LANTUS SOLOSTAR) 100 UNIT/ML INJECTION    15 Units 2 (two) times daily.    METFORMIN (GLUCOPHAGE) 1000 MG TABLET    THE DOSE HAS BEEN REDUCED TO 1/2 TABLET TWO TIMES DAILY.   METFORMIN (GLUCOPHAGE) 500 MG TABLET    Take 500 mg by mouth 2 (two) times daily with a meal.     MISC. DEVICES (WALKER) MISC    by Does not apply route.     MULTIPLE VITAMIN (MULTIVITAMIN) TABLET    Take 1 tablet by mouth daily.     ONDANSETRON (ZOFRAN) 4 MG TABLET    Take 1 tablet (4 mg total) by mouth every 6 (six) hours.   POLYETHYLENE GLYCOL (MIRALAX / GLYCOLAX) PACKET    ADD TO WATER DAILY FOR CONSTIPATION.   POTASSIUM CHLORIDE SA (K-DUR,KLOR-CON) 20 MEQ TABLET    Take 1 tablet (20 mEq total) by mouth daily.   TEMAZEPAM  (RESTORIL) 30 MG CAPSULE    Take 30 mg by mouth at bedtime as needed. Sleep      ALLERGIES:  Allergies as of 02/21/2011 - Review Complete 02/21/2011  Allergen Reaction Noted  . Other Swelling 01/22/2011  . Penicillins Swelling 09/13/2007     FAMILY HISTORY:  Family History  Problem Relation Age of Onset  . Cancer Mother     pelvic      SOCIAL HISTORY: History   Social History  . Marital Status: Widowed    Spouse Name: N/A    Number of Children: 1  . Years of Education: N/A   Occupational History  . retired     Social History Main Topics  . Smoking status: Never Smoker   . Smokeless tobacco: Current User    Types: Chew  . Alcohol Use: No     Hx of Alcohol dependecy   . Drug Use: No  . Sexually Active: No   Other Topics Concern  . None   Social History Narrative  . None     Review of Systems 10 Systems reviewed and are negative for acute change except as noted in the HPI.  Physical Exam  BP 129/65  Pulse 123  Temp(Src) 99.1 F (37.3 C) (Oral)  Resp 24  SpO2 98%  Physical Exam  Nursing note and vitals reviewed. Constitutional: She is oriented to person, place, and time. She appears well-developed and well-nourished.  HENT:  Head: Normocephalic and atraumatic.  Eyes: Pupils are equal, round, and reactive to light.  Neck: Neck supple.  Cardiovascular: Normal heart sounds.  Tachycardia present.   No murmur heard. Pulmonary/Chest: Effort normal. No respiratory distress.  Abdominal: Soft.       Active bowel sounds,  Midline lower abdominal scar with staples in places, mild serous drainage at superior end of incision, mild epigastric tenderness,    Musculoskeletal: Normal range of motion.  Neurological: She is alert and oriented to person, place, and time.  Skin: Skin is warm and dry.  Psychiatric: She has a normal mood and affect. Her behavior is normal.    ED Course  Procedures  OTHER DATA REVIEWED: Nursing notes, vital signs, and past medical  records reviewed.   DIAGNOSTIC STUDIES: Oxygen Saturation is 98 on room air, normal by my interpretation.       LABS / RADIOLOGY:  Results for orders placed during  the hospital encounter of 02/21/11  CBC      Component Value Range   WBC 15.7 (*) 4.0 - 10.5 (K/uL)   RBC 3.51 (*) 3.87 - 5.11 (MIL/uL)   Hemoglobin 10.6 (*) 12.0 - 15.0 (g/dL)   HCT 16.1 (*) 09.6 - 46.0 (%)   MCV 88.3  78.0 - 100.0 (fL)   MCH 30.2  26.0 - 34.0 (pg)   MCHC 34.2  30.0 - 36.0 (g/dL)   RDW 04.5  40.9 - 81.1 (%)   Platelets 477 (*) 150 - 400 (K/uL)  DIFFERENTIAL      Component Value Range   Neutrophils Relative 87 (*) 43 - 77 (%)   Neutro Abs 13.6 (*) 1.7 - 7.7 (K/uL)   Lymphocytes Relative 10 (*) 12 - 46 (%)   Lymphs Abs 1.5  0.7 - 4.0 (K/uL)   Monocytes Relative 4  3 - 12 (%)   Monocytes Absolute 0.6  0.1 - 1.0 (K/uL)   Eosinophils Relative 0  0 - 5 (%)   Eosinophils Absolute 0.0  0.0 - 0.7 (K/uL)   Basophils Relative 0  0 - 1 (%)   Basophils Absolute 0.0  0.0 - 0.1 (K/uL)    No results found.    ED COURSE / COORDINATION OF CARE:  Orders Placed This Encounter  Procedures  . CT Abdomen Pelvis W Contrast  . CBC  . Differential  . Comprehensive metabolic panel  . Lipase, blood  . Urinalysis with microscopic  . In and Out Cath    MDM: Nursing notes reviewed, prior ED visit and hospitalization record reviewed. She had not been accepted for SNF, had originally declined ALF, now willing to go to ALF.   IMPRESSION: Diagnoses that have been ruled out:  Diagnoses that are still under consideration:  Final diagnoses:    PLAN:  Admitted to Floor the case was discussed with the admitting physician Dr. Orvan Falconer Condition stable   CONDITION ON DISCHARGE: Stable   MEDICATIONS GIVEN IN THE E.D.  Medications  ondansetron (ZOFRAN) injection 4 mg   morphine injection 4 mg   sodium chloride 0.9 % bolus 500 mL   0.9 %  sodium chloride infusion      DISCHARGE MEDICATIONS: New  Prescriptions   No medications on file     I personally performed the services described in this documentation, which was scribed in my presence. The recorded information has been reviewed and considered.  Add Scribe Statement     Dione Booze, MD 02/21/11 [phone]

## 2011-02-21 NOTE — H&P (Signed)
PCP:   [name], MD, MD   Chief Complaint:  Weakness.  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 Patient had made a surprisingly good recovery and so did not qualify for SNF placement; she declined offered ALF placement and was discharged home. Since discharge pt has been back to the ED 3 times complaining of weakness and unable to stay home alone. She wants ALF placement.   She had a CT scan of the abd/pelvis which did not show any acute problem. She was seen by the case manager earlier today and advised to arrange placement with her Home Health service.  Pt now returns to ED 1 hour after previous visit requesting admission, and the hospitalist was called to assist with management.  She is noted to have an elevated white count on CBC.  On questioning by the hospitalist patient is oriented to her location but not the month or year; She give the month as February, and the year is 19-something. She is unable to articulate the reason for her recent admission, except that she had a stomach problem; When asked about her surgery she says it was for a hernia. She is unable to give the phone number for her step daughter although she says she knows it;( the number listed in the chart [phone] appears to be incorrect)   Apart from weakness pt denies any acute problem. She was incontinent of urine in the ED ans says this is chronic.   Past Medical History: Past Medical History  Diagnosis Date  . Hypertension   . Reflux   . Right elbow pain     OTIF  . Dementia   . Depression   . Osteoarthritis   . Pancreatitis 11/2007    [facility]  . Angiomyolipoma of kidney     right  . Ulcerative esophagitis 12/05/2007    hx elevated gastrin, severe on EGD by Dr Jena Gauss , h pylori negative  . Hiatal hernia   . S/P colonoscopy 2009    pt reports normal by Dr  Lovell Sheehan  . Diabetes mellitus   . Anemia    Past Surgical History  Procedure Date  . Orif right hip 1999    [facility]  . Umbilical hernia repair 21 months old    [place]  . Esophagogastroduodenoscopy 01/28/2011    Procedure: ESOPHAGOGASTRODUODENOSCOPY (EGD);  Surgeon: Arlyce Harman, MD;  Location: [facility];  Service: Endoscopy;  Laterality: N/A;  . Colonoscopy 01/28/2011    Procedure: COLONOSCOPY;  Surgeon: Arlyce Harman, MD;  Location: [facility];  Service: Endoscopy;  Laterality: N/A;  . Laparotomy 02/04/2011    Procedure: EXPLORATORY LAPAROTOMY;  Surgeon: Fabio Bering;  Location: [facility];  Service: General;  Laterality: N/A;    Medications: Prior to Admission medications   Medication Sig Start Date End Date Taking? Authorizing Provider  amLODipine (NORVASC) 5 MG tablet Take 1 tablet (5 mg total) by mouth daily. Take one tablet by mouth once a day  12/08/10   [name], MD  B-D ULTRAFINE III SHORT PEN 31G X 8 MM MISC USE AS DIRECTED. 11/29/10   [name], MD  benazepril (LOTENSIN) 20 MG tablet Take 1 tablet (20 mg total) by mouth daily. 12/08/10 12/08/11  [name], MD  calcium-vitamin D (OSCAL WITH D) 500-200 MG-UNIT per tablet Take 1 tablet by mouth daily.  Historical Provider, MD  darifenacin (ENABLEX) 15 MG 24 hr tablet Take 15 mg by mouth daily.     [name], MD  doxycycline (VIBRA-TABS) 100 MG tablet Take 1 tablet (100 mg total) by mouth 2 (two) times daily. 02/21/11 03/03/11  Charles B. Bernette Mayers, MD  esomeprazole (NEXIUM) 40 MG capsule Take 40 mg by mouth 2 (two) times daily.   02/20/11   Elliot Cousin  glipiZIDE (GLUCOTROL) 10 MG tablet Take 10 mg by mouth daily. THE DOSE HAS BEEN REDUCED TO 1 TABLET ONCE DAILY.  DO NOT TAKE THIS MEDICINE IF YOUR BLOOD SUGAR IS LESS THAN 130.  02/20/11   Denise Fisher  glucose blood (ONE TOUCH TEST STRIPS) test strip 1 each by Other route 3 (three) times daily. Use as directed three times a day     Historical Provider,  MD  HYDROcodone-acetaminophen (NORCO) 5-325 MG per tablet Take 1 tablet by mouth every 4 (four) hours as needed for pain. 02/20/11 03/02/11  Elliot Cousin  insulin glargine (LANTUS SOLOSTAR) 100 UNIT/ML injection 15 Units 2 (two) times daily.  01/16/11   [name], MD  metFORMIN (GLUCOPHAGE) 1000 MG tablet THE DOSE HAS BEEN REDUCED TO 1/2 TABLET TWO TIMES DAILY. 02/20/11   Elliot Cousin  metFORMIN (GLUCOPHAGE) 500 MG tablet Take 500 mg by mouth 2 (two) times daily with a meal.      Historical Provider, MD  Misc. Devices [facility]) MISC by Does not apply route.      Historical Provider, MD  Multiple Vitamin (MULTIVITAMIN) tablet Take 1 tablet by mouth daily.      Historical Provider, MD  ondansetron (ZOFRAN) 4 MG tablet Take 1 tablet (4 mg total) by mouth every 6 (six) hours. 02/21/11 02/28/11  Charles B. Bernette Mayers, MD  polyethylene glycol (MIRALAX / GLYCOLAX) packet ADD TO WATER DAILY FOR CONSTIPATION. 02/20/11   Denise Fisher  potassium chloride SA (K-DUR,KLOR-CON) 20 MEQ tablet Take 1 tablet (20 mEq total) by mouth daily. 02/20/11 02/20/12  Elliot Cousin  temazepam (RESTORIL) 30 MG capsule Take 30 mg by mouth at bedtime as needed. Sleep  12/08/10   [name], MD    Allergies:   Allergies  Allergen Reactions  . Other Swelling    Pinto beans cause swelling and hives  . Penicillins Swelling    Social History:  reports that she has never smoked. Her smokeless tobacco use includes Chew. She reports that she does not drink alcohol or use illicit drugs.  Family History: Family History  Problem Relation Age of Onset  . Cancer Mother     pelvic     Physical Exam: Filed Vitals:   02/21/11 1511 02/21/11 1807  BP: 129/65 144/81  Pulse: 123 120  Temp: 99.1 F (37.3 C) 99.1 F (37.3 C)  TempSrc: Oral Oral  Resp: 24 12  SpO2: 98% 98%   General appearance: alert, distracted, moderately obese and slowed mentation Head: Normocephalic, without obvious abnormality, atraumatic Eyes:  conjunctivae/corneas clear. PERL; mm pale. Throat: lips, mucosa, dry Neck: no adenopathy, no carotid bruit, no JVD,  Back: symmetric, no curvature. ROM normal. No CVA tenderness. Resp: clear to auscultation bilaterally Chest wall: no tenderness Cardio: regular rate and rhythm, S1, S2 normal, no murmur, click, rub or gallop GI: Healing laparotomy scar; red an inflamed but no inappropriate tenderness; soft, non-tender; bowel sounds normal; y Extremities: extremities normal, atraumatic, no cyanosis or edema Pulses: 2+ and symmetric Skin: Skin color, texture, turgor normal. No rashes or lesions Neurologic: Grossly normal   Labs  on Admission:   Basename 02/21/11 1622 02/21/11 0540 02/19/11 0502  NA 130* 131* --  K 4.1 3.5 --  CL 93* 95* --  CO2 25 26 --  GLUCOSE 191* 180* --  BUN 22 17 --  CREATININE 0.94 0.95 --  CALCIUM 8.7 8.4 --  MG -- -- 1.7  PHOS -- -- --    Basename 02/21/11 1622 02/19/11 0502  AST 11 15  ALT 9 13  ALKPHOS 106 102  BILITOT 0.2* 0.2*  PROT 5.6* 5.8*  ALBUMIN 2.4* 2.5*    Basename 02/21/11 1622 02/19/11 0502  LIPASE 17 18  AMYLASE -- --    Basename 02/21/11 1622 02/21/11 0540  WBC 15.7* 12.5*  NEUTROABS 13.6* 10.8*  HGB 10.6* 11.1*  HCT 31.0* 32.5*  MCV 88.3 88.3  PLT 477* 473*     Radiological Exams on Admission: Dg Chest 2 View  01/26/2011  *RADIOLOGY REPORT*  Clinical Data: Syncope.  Dizziness.  Shortness of breath.  CHEST - 2 VIEW  Comparison: 03/29/2010  Findings: Heart size is normal.  Both lungs are clear.  No evidence of pleural effusion.  No mass or lymphadenopathy identified.  IMPRESSION: Stable exam.  No active disease.  Original Report Authenticated By: Danae Orleans, M.D.   Ct Head Wo Contrast  01/25/2011  *RADIOLOGY REPORT*  Clinical Data: Severe headache.  Dementia.  CT HEAD WITHOUT CONTRAST  Technique:  Contiguous axial images were obtained from the base of the skull through the vertex without contrast.  Comparison: 03/30/2009   Findings: There is no evidence of intracranial hemorrhage, brain edema or other signs of acute infarction.  There is no evidence of intracranial mass lesion or mass effect.  No abnormal extra-axial fluid collections are identified.  Moderate to severe cerebellar atrophy and mild cerebral atrophy are stable in appearance. Moderate chronic small vessel disease is also unchanged.  No evidence of hydrocephalus.  No skull abnormality identified.  IMPRESSION:  1.  No acute intracranial abnormality. 2.  Stable cerebral and cerebellar atrophy and chronic small vessel disease.  Original Report Authenticated By: Danae Orleans, M.D.   Ct Angio Chest W/cm &/or Wo Cm  02/07/2011  *RADIOLOGY REPORT*  Clinical Data:  Chest pain, evaluate for PE  CT ANGIOGRAPHY CHEST WITH CONTRAST  Technique:  Multidetector CT imaging of the chest was performed using the standard protocol during bolus administration of intravenous contrast.  Multiplanar CT image reconstructions including MIPs were obtained to evaluate the vascular anatomy.  Contrast:  100 ml Omnipaque-300 IV  Comparison:  None.  Findings:  No evidence of pulmonary embolism.  Mild patchy opacity in the lingula and medial left lower lobe, likely atelectasis.  Trace left pleural effusion.  No pneumothorax.  The heart is top normal in size.  Coronary atherosclerosis.  Small posterior pericardial fluid.  No suspicious mediastinal, hilar, or axillary lymphadenopathy.  Visualized upper abdomen is unremarkable.  Degenerative changes of the visualized thoracolumbar spine.  Review of the MIP images confirms the above findings.  IMPRESSION: No evidence of pulmonary embolism.  Original Report Authenticated By: Charline Bills, M.D.   Ct Angio Chest W/cm &/or Wo Cm  01/26/2011  *RADIOLOGY REPORT*  Clinical Data:  Short of breath. Tachycardia.  Syncope.  Elevated D- dimer.  CT ANGIOGRAPHY CHEST WITH CONTRAST  Technique:  Multidetector CT imaging of the chest was performed using the  standard protocol during bolus administration of intravenous contrast.  Multiplanar CT image reconstructions including MIPs were obtained to evaluate the vascular anatomy.  Contrast:  100 ml Omnipaque-300  Comparison:  None.  Findings:  Satisfactory opacification of the pulmonary arteries noted, and there is no evidence of pulmonary emboli.  No evidence of thoracic aortic aneurysm or dissection. Diffuse coronary artery calcification noted.  No mediastinal or hilar masses are identified.  No adenopathy seen elsewhere within the thorax.  No evidence of pleural or pericardial effusion.  Both lungs are clear.  No evidence of pulmonary infiltrate or mass.  No endobronchial lesion identified.  Review of the MIP images confirms the above findings.  IMPRESSION:  1.  No evidence of pulmonary embolism or other acute findings. 2.  Diffuse coronary artery calcification noted.  Original Report Authenticated By: Danae Orleans, M.D.   Ct Abdomen Pelvis W Contrast  02/21/2011  *RADIOLOGY REPORT*  Clinical Data: Abdominal pain.  Recent upper GI bleed requiring coil embolization of the gastroduodenal artery.  Recent surgery of unknown type.  CT ABDOMEN AND PELVIS WITH CONTRAST  Technique:  Multidetector CT imaging of the abdomen and pelvis was performed following the standard protocol during bolus administration of intravenous contrast.  Contrast: 100 ml Omnipaque 300 IV.  Comparison: 01/27/2011  Findings: There is marked edema, inflammatory stranding and wall thickening involving the distal stomach and proximal duodenum. Stranding noted within the adjacent omentum/mesentery and overlying anterior abdominal wall.  This presumably represents postoperative changes from recent surgery.  Recommend clinical correlation for recent gastric or duodenal surgery.  Coils and/or staples are seen in the region of the proximal duodenum.  Liver, spleen, pancreas, adrenals are unremarkable.  Small low- density lesions in the kidneys, likely  cysts.  No hydronephrosis.  Descending colonic and sigmoid diverticula.  No active diverticulitis.  Urinary bladder is grossly unremarkable.  Uterus and adnexa grossly unremarkable.  Aorta is normal caliber.  Minimal right base atelectasis.  No effusions.  Small hiatal hernia present.  IMPRESSION: Marked wall thickening and surrounding edema/stranding around the distal stomach and proximal duodenum.  This is presumably related to recent gastric/duodenal surgery.  Recommend clinical correlation.  Original Report Authenticated By: Cyndie Chime, M.D.   Ir Angiogram Visceral Selective  02/10/2011  *RADIOLOGY REPORT*  Clinical Data: Upper GI bleed secondary to inflammatory duodenal/pancreatic lesion.  SELECTIVE VISCERAL ARTERIOGRAPHY,ULTRASOUND VENOUS ACCESS,ADDITIONAL ARTERIOGRAPHY,ARTERIOGRAPHY,TRANSCATHETER THERAPY EMBOLIZATION  Comparison: 01/27/2011  Technique and findings:  The procedure, risks (including but not limited to bleeding, infection, organ damage), benefits, and alternatives were explained to the patient.  Questions regarding the procedure were encouraged and answered.  The patient understands and consents to the procedure. The right femoral region prepped and draped usual sterile fashion. Maximal barrier sterile technique was utilized including caps, mask, sterile gowns, sterile gloves, sterile drape, hand hygiene and skin antiseptic.  Intravenous Fentanyl and Versed were administered as conscious sedation during continuous cardiorespiratory monitoring by the radiology RN, with a total moderate sedation time of 32 minutes.  Under real time ultrasound guidance, the right common femoral artery was accessed with   21-gauge micropuncture needle using single-wall technique in a single pass.  Ultrasound documentation was stored.  The needle was exchanged over a 0.018-inch guide wire for a transitional dilator, which allowed passage of a Teena Dunk guide wire into the abdominal aorta. Over this, a 5-French  vascular sheath was placed.  Through this, 5-French C2 catheter was advanced and used to selectively catheterize the celiac axis for arteriography.  The gastroduodenal artery was identified.  With an angled glide wire, the G D A was catheterized.  Selective arteriography confirmed appropriate catheter position.  Parenchymal blush was evident with no overt active extravasation or other focal arterial lesion.  The G D A was embolized with three, four, and 5 mm fibroid embolization coils to cessation of flow.  Follow-up arteriography demonstrates no further flow through the treated segment.  Continued good flow into the common hepatic artery.  No apparent complication.  After confirmatory femoral arteriography, the sheath was removed and hemostasis achieved with the Exoseal device. The patient tolerated the procedure well.  No immediate complication  IMPRESSION:  1.  Technically successful coil embolization of the gastroduodenal artery to control the upper GI bleed.  Original Report Authenticated By: Osa Craver, M.D.   Ir Angiogram Selective Each Additional Vessel  02/10/2011  *RADIOLOGY REPORT*  Clinical Data: Upper GI bleed secondary to inflammatory duodenal/pancreatic lesion.  SELECTIVE VISCERAL ARTERIOGRAPHY,ULTRASOUND VENOUS ACCESS,ADDITIONAL ARTERIOGRAPHY,ARTERIOGRAPHY,TRANSCATHETER THERAPY EMBOLIZATION  Comparison: 01/27/2011  Technique and findings:  The procedure, risks (including but not limited to bleeding, infection, organ damage), benefits, and alternatives were explained to the patient.  Questions regarding the procedure were encouraged and answered.  The patient understands and consents to the procedure. The right femoral region prepped and draped usual sterile fashion. Maximal barrier sterile technique was utilized including caps, mask, sterile gowns, sterile gloves, sterile drape, hand hygiene and skin antiseptic.  Intravenous Fentanyl and Versed were administered as conscious sedation  during continuous cardiorespiratory monitoring by the radiology RN, with a total moderate sedation time of 32 minutes.  Under real time ultrasound guidance, the right common femoral artery was accessed with   21-gauge micropuncture needle using single-wall technique in a single pass.  Ultrasound documentation was stored.  The needle was exchanged over a 0.018-inch guide wire for a transitional dilator, which allowed passage of a Teena Dunk guide wire into the abdominal aorta. Over this, a 5-French vascular sheath was placed.  Through this, 5-French C2 catheter was advanced and used to selectively catheterize the celiac axis for arteriography.  The gastroduodenal artery was identified.  With an angled glide wire, the G D A was catheterized.  Selective arteriography confirmed appropriate catheter position. Parenchymal blush was evident with no overt active extravasation or other focal arterial lesion.  The G D A was embolized with three, four, and 5 mm fibroid embolization coils to cessation of flow.  Follow-up arteriography demonstrates no further flow through the treated segment.  Continued good flow into the common hepatic artery.  No apparent complication.  After confirmatory femoral arteriography, the sheath was removed and hemostasis achieved with the Exoseal device. The patient tolerated the procedure well.  No immediate complication  IMPRESSION:  1.  Technically successful coil embolization of the gastroduodenal artery to control the upper GI bleed.  Original Report Authenticated By: Osa Craver, M.D.   Ir Transcath/emboliz  02/10/2011  *RADIOLOGY REPORT*  Clinical Data: Upper GI bleed secondary to inflammatory duodenal/pancreatic lesion.  SELECTIVE VISCERAL ARTERIOGRAPHY,ULTRASOUND VENOUS ACCESS,ADDITIONAL ARTERIOGRAPHY,ARTERIOGRAPHY,TRANSCATHETER THERAPY EMBOLIZATION  Comparison: 01/27/2011  Technique and findings:  The procedure, risks (including but not limited to bleeding, infection, organ  damage), benefits, and alternatives were explained to the patient.  Questions regarding the procedure were encouraged and answered.  The patient understands and consents to the procedure. The right femoral region prepped and draped usual sterile fashion. Maximal barrier sterile technique was utilized including caps, mask, sterile gowns, sterile gloves, sterile drape, hand hygiene and skin antiseptic.  Intravenous Fentanyl and Versed were administered as conscious sedation during continuous cardiorespiratory monitoring by the radiology RN, with a total moderate sedation  time of 32 minutes.  Under real time ultrasound guidance, the right common femoral artery was accessed with   21-gauge micropuncture needle using single-wall technique in a single pass.  Ultrasound documentation was stored.  The needle was exchanged over a 0.018-inch guide wire for a transitional dilator, which allowed passage of a Teena Dunk guide wire into the abdominal aorta. Over this, a 5-French vascular sheath was placed.  Through this, 5-French C2 catheter was advanced and used to selectively catheterize the celiac axis for arteriography.  The gastroduodenal artery was identified.  With an angled glide wire, the G D A was catheterized.  Selective arteriography confirmed appropriate catheter position. Parenchymal blush was evident with no overt active extravasation or other focal arterial lesion.  The G D A was embolized with three, four, and 5 mm fibroid embolization coils to cessation of flow.  Follow-up arteriography demonstrates no further flow through the treated segment.  Continued good flow into the common hepatic artery.  No apparent complication.  After confirmatory femoral arteriography, the sheath was removed and hemostasis achieved with the Exoseal device. The patient tolerated the procedure well.  No immediate complication  IMPRESSION:  1.  Technically successful coil embolization of the gastroduodenal artery to control the upper GI  bleed.  Original Report Authenticated By: Osa Craver, M.D.   Ir Angiogram Follow Up Study  02/10/2011  *RADIOLOGY REPORT*  Clinical Data: Upper GI bleed secondary to inflammatory duodenal/pancreatic lesion.  SELECTIVE VISCERAL ARTERIOGRAPHY,ULTRASOUND VENOUS ACCESS,ADDITIONAL ARTERIOGRAPHY,ARTERIOGRAPHY,TRANSCATHETER THERAPY EMBOLIZATION  Comparison: 01/27/2011  Technique and findings:  The procedure, risks (including but not limited to bleeding, infection, organ damage), benefits, and alternatives were explained to the patient.  Questions regarding the procedure were encouraged and answered.  The patient understands and consents to the procedure. The right femoral region prepped and draped usual sterile fashion. Maximal barrier sterile technique was utilized including caps, mask, sterile gowns, sterile gloves, sterile drape, hand hygiene and skin antiseptic.  Intravenous Fentanyl and Versed were administered as conscious sedation during continuous cardiorespiratory monitoring by the radiology RN, with a total moderate sedation time of 32 minutes.  Under real time ultrasound guidance, the right common femoral artery was accessed with   21-gauge micropuncture needle using single-wall technique in a single pass.  Ultrasound documentation was stored.  The needle was exchanged over a 0.018-inch guide wire for a transitional dilator, which allowed passage of a Teena Dunk guide wire into the abdominal aorta. Over this, a 5-French vascular sheath was placed.  Through this, 5-French C2 catheter was advanced and used to selectively catheterize the celiac axis for arteriography.  The gastroduodenal artery was identified.  With an angled glide wire, the G D A was catheterized.  Selective arteriography confirmed appropriate catheter position. Parenchymal blush was evident with no overt active extravasation or other focal arterial lesion.  The G D A was embolized with three, four, and 5 mm fibroid embolization coils to  cessation of flow.  Follow-up arteriography demonstrates no further flow through the treated segment.  Continued good flow into the common hepatic artery.  No apparent complication.  After confirmatory femoral arteriography, the sheath was removed and hemostasis achieved with the Exoseal device. The patient tolerated the procedure well.  No immediate complication  IMPRESSION:  1.  Technically successful coil embolization of the gastroduodenal artery to control the upper GI bleed.  Original Report Authenticated By: Osa Craver, M.D.   Ir US Guide Vasc Access Right  02/10/2011  *RADIOLOGY REPORT*  Clinical Data: Upper  GI bleed secondary to inflammatory duodenal/pancreatic lesion.  SELECTIVE VISCERAL ARTERIOGRAPHY,ULTRASOUND VENOUS ACCESS,ADDITIONAL ARTERIOGRAPHY,ARTERIOGRAPHY,TRANSCATHETER THERAPY EMBOLIZATION  Comparison: 01/27/2011  Technique and findings:  The procedure, risks (including but not limited to bleeding, infection, organ damage), benefits, and alternatives were explained to the patient.  Questions regarding the procedure were encouraged and answered.  The patient understands and consents to the procedure. The right femoral region prepped and draped usual sterile fashion. Maximal barrier sterile technique was utilized including caps, mask, sterile gowns, sterile gloves, sterile drape, hand hygiene and skin antiseptic.  Intravenous Fentanyl and Versed were administered as conscious sedation during continuous cardiorespiratory monitoring by the radiology RN, with a total moderate sedation time of 32 minutes.  Under real time ultrasound guidance, the right common femoral artery was accessed with   21-gauge micropuncture needle using single-wall technique in a single pass.  Ultrasound documentation was stored.  The needle was exchanged over a 0.018-inch guide wire for a transitional dilator, which allowed passage of a Teena Dunk guide wire into the abdominal aorta. Over this, a 5-French vascular  sheath was placed.  Through this, 5-French C2 catheter was advanced and used to selectively catheterize the celiac axis for arteriography.  The gastroduodenal artery was identified.  With an angled glide wire, the G D A was catheterized.  Selective arteriography confirmed appropriate catheter position. Parenchymal blush was evident with no overt active extravasation or other focal arterial lesion.  The G D A was embolized with three, four, and 5 mm fibroid embolization coils to cessation of flow.  Follow-up arteriography demonstrates no further flow through the treated segment.  Continued good flow into the common hepatic artery.  No apparent complication.  After confirmatory femoral arteriography, the sheath was removed and hemostasis achieved with the Exoseal device. The patient tolerated the procedure well.  No immediate complication  IMPRESSION:  1.  Technically successful coil embolization of the gastroduodenal artery to control the upper GI bleed.  Original Report Authenticated By: Osa Craver, M.D.   Ct Abd Wo & W Cm  01/27/2011  *RADIOLOGY REPORT*  Clinical Data: Follow-up pancreatitis.  Abdominal pain and diarrhea.  CT ABDOMEN WITHOUT AND WITH CONTRAST  Technique:  Multidetector CT imaging of the abdomen was performed following the standard protocol before and during bolus administration of intravenous contrast.  Contrast: 100 ml Omnipaque-300  Comparison: 01/20/2011  Findings: No evidence of pancreatic mass or pancreatic ductal dilatation.  Previously described soft tissue stranding adjacent to the pancreatic head is no longer visualized.  No evidence of peripancreatic fluid.  No evidence of adjacent gastric or bowel wall thickening.  A small hiatal hernia is again noted.  Mild hepatic steatosis again demonstrated, however no liver masses are identified.  Gallbladder is unremarkable.  The spleen, and adrenal glands are normal in appearance.  A 1 cm benign angiomyolipoma is again seen in the  upper pole of the right kidney. Other tiny sub-centimeter low attenuation lesions in both kidneys are also stable.  No evidence of renal calculi or hydronephrosis.  No other soft tissue masses or lymphadenopathy identified within the abdomen.  No other inflammatory process or abnormal fluid collections are identified.  Diverticulosis is seen involving the visualized portion of the descending colon, however there is no evidence of diverticulitis in this region.  IMPRESSION:  1.  No CT evidence of pancreatitis or other acute findings on today's exam. 2.  Mild hepatic steatosis. 3. Stable small hiatal hernia and left colonic diverticulosis. 4.  Stable 1 cm benign right renal  angiomyolipoma.  Original Report Authenticated By: Danae Orleans, M.D.   Dg Chest Portable 1 View  02/10/2011  *RADIOLOGY REPORT*  Clinical Data: C E L placement  PORTABLE CHEST - 1 VIEW  Comparison: 01/26/2011  Findings: Interval placement of left central venous catheter with tip over the upper SVC region.  No pneumothorax.  Shallow inspiration with interval development of atelectasis in the left lung base.  Borderline heart size with normal pulmonary vascularity.  No blunting of costophrenic angles.  Tortuous aorta.  IMPRESSION: Left central venous catheter placed with tip to the SVC region.  No pneumothorax.  Atelectasis developing in the left lung base.  Original Report Authenticated By: Marlon Pel, M.D.    Assessment/Plan Present on Admission:  .Confusion .Leucocytosis .DIABETES MELLITUS, TYPE II .Gastric mass .Anemia .HYPERTENSION .Other urinary incontinence .UNSTEADY GAIT .Obesity, unspecified .Hyponatremia .HYPERLIPIDEMIA  This lady does not appear to be safe to be at home by herself; she has acute confusion and progressive leucocytosis. This may be just progression of malignancy; will get CT brain to r/o mets or intracranil inj. Will consult CM for placement.  Will ask nurses to locate her discharge  medications and resume those. Devlin Brink 02/21/2011, 9:17 PM

## 2011-02-21 NOTE — ED Provider Notes (Signed)
History     CSN: [id number] Arrival date & time: 02/21/2011  4:58 AM  Chief Complaint  Patient presents with  . Nausea   HPI Comments: PT with recent exploratory laparotoy, and per notes had wound infection, now presents with nausea/chills and continued abd pain    Patient is a 77 y.o. female presenting with abdominal pain.  Abdominal Pain The primary symptoms of the illness include abdominal pain, nausea and vomiting. The current episode started yesterday. The onset of the illness was gradual.  The pain came on gradually. The abdominal pain does not radiate. The abdominal pain is relieved by vomiting.    Past Medical History  Diagnosis Date  . Hypertension   . Reflux   . Right elbow pain     OTIF  . Dementia   . Depression   . Osteoarthritis   . Pancreatitis 11/2007    [facility]  . Angiomyolipoma of kidney     right  . Ulcerative esophagitis 12/05/2007    hx elevated gastrin, severe on EGD by Dr Jena Gauss , h pylori negative  . Hiatal hernia   . S/P colonoscopy 2009    pt reports normal by Dr Lovell Sheehan  . Diabetes mellitus   . Anemia     Past Surgical History  Procedure Date  . Orif right hip 1999    [facility]  . Umbilical hernia repair 48 months old    [place]  . Esophagogastroduodenoscopy 01/28/2011    Procedure: ESOPHAGOGASTRODUODENOSCOPY (EGD);  Surgeon: Arlyce Harman, MD;  [facility];  Service: Endoscopy;  Laterality: N/A;  . Colonoscopy 01/28/2011    Procedure: COLONOSCOPY;  Surgeon: Arlyce Harman, MD;  [facility]: [facility];  Service: Endoscopy;  Laterality: N/A;  . Laparotomy 02/04/2011    Procedure: EXPLORATORY LAPAROTOMY;  Surgeon: Fabio Bering;  Location: [facility];  Service: General;  Laterality: N/A;    Family History  Problem Relation Age of Onset  . Cancer Mother     pelvic     History  Substance Use Topics  . Smoking status: Never Smoker   . Smokeless tobacco: Current User    Types: Chew  . Alcohol Use: No     Hx of Alcohol dependecy      OB History    Grav Para Term Preterm Abortions TAB SAB Ect Mult Living                  Review of Systems  Gastrointestinal: Positive for nausea, vomiting and abdominal pain.  All other systems reviewed and are negative.    Physical Exam  BP 114/64  Pulse 124  Temp(Src) 99 F (37.2 C) (Oral)  Resp 20  SpO2 96%  Physical Exam  CONSTITUTIONAL: Well developed/well nourished HEAD AND FACE: Normocephalic/atraumatic EYES: EOMI/PERRL ENMT: Mucous membranes moist NECK: supple no meningeal signs SPINE:entire spine nontender CV: S1/S2 noted, no murmurs/rubs/gallops noted LUNGS: Lungs are clear to auscultation bilaterally, no apparent distress ABDOMEN: soft,large vertical midline incision noted with some erythema and small amt of pus noted on the proximal portion GU:no cva tenderness NEURO: Pt is awake/alert, moves all extremitiesx4 EXTREMITIES: pulses normal, full ROM SKIN: warm, color normal PSYCH: no abnormalities of mood noted   ED Course  Procedures  MDM Previous records reviewed and considered Nursing notes reviewed and considered in documentation All labs/vitals reviewed and considered     Date: 02/21/2011  Rate: 110  Rhythm: sinus tachycardia  QRS Axis: normal  Intervals: normal  ST/T Wave abnormalities: nonspecific ST  changes  Conduction Disutrbances:none  Narrative Interpretation:   Old EKG Reviewed: rate is faster today  I discussed case with dr Lovell Sheehan.  He knows of patient.  He will see patient in 2 hours, requests I remove 2-3 staples from wound to see if any pus is extracted and to start vancomycin I removed 3 staples but no pus extracted  Pt stable at this time, awaiting surgical eval    Joya Gaskins, MD 02/22/11 [phone]

## 2011-02-21 NOTE — ED Notes (Signed)
Social work came to see patient and wants me to call [facility] to have a social worker come to her house.  Call RN supervisor to get a cab voucher.

## 2011-02-21 NOTE — ED Notes (Signed)
Call placed to nurses station; c/o n/v; EDP notified and orders rec'd.

## 2011-02-21 NOTE — ED Notes (Signed)
Faxed order for evaluation to [facility] [phone]

## 2011-02-21 NOTE — ED Notes (Signed)
Pt requesting pain medication.  

## 2011-02-21 NOTE — ED Notes (Signed)
Pt now being admitted due to pt not having anyone at home to care for her. Attempted to give report to [place], rn but she will have to call me back.

## 2011-02-21 NOTE — ED Notes (Signed)
Urine obtained via straight catheter via sterile technique. Patient tolerated procedure well.

## 2011-02-21 NOTE — Consult Note (Signed)
CSW was consulted as Pt just D/Ced from 3rd floor yesterday CSW and had been working with Pt regarding D/C plan.  Pt had chosen to return home vs. going to an assisted living facility.  Today, Pt requests assistance with ALF placement.  CSW explained that neither Dr. Bernette Mayers nor Dr. Lovell Sheehan felt Pt required admission and that [facility], who Pt is currently receiving services from, could send a social worker to her home to assist in placement if she has determined this is what she needs.  CSW requested an order for home health social worker, which Dr. Bernette Mayers agreed to write.  CSW will sign off at this time.

## 2011-02-21 NOTE — Consult Note (Signed)
Reason for Consult: Question wound infection Referring Physician: Emergency room  [name] is an 77 y.o. female.  HPI: 77 year old black female who was just discharged from [facility] yesterday after being diagnosed with a duodenal ulcer. An exploratory laparotomy was done by Dr. Leticia Penna on 02/04/2011. A duodenal ulcer with invasion into the pancreas was noted. Fine-needle aspiration was negative for pancreatic neoplasm. [name] subsequently underwent invasive radiology to coil the gastroduodenal artery system. [name] was supposed to have a home health aide start today. [name] presented yesterday evening to the emergency room with epigastric pain. Apparently this has been ongoing. No nausea or vomiting have been noted.  Past Medical History  Diagnosis Date  . Hypertension   . Reflux   . Right elbow pain     OTIF  . Dementia   . Depression   . Osteoarthritis   . Pancreatitis 11/2007    [facility]  . Angiomyolipoma of kidney     right  . Ulcerative esophagitis 12/05/2007    hx elevated gastrin, severe on EGD by Dr Jena Gauss , h pylori negative  . Hiatal hernia   . S/P colonoscopy 2009    pt reports normal by Dr Lovell Sheehan  . Diabetes mellitus   . Anemia     Past Surgical History  Procedure Date  . Orif right hip 1999    [facility]  . Umbilical hernia repair 66 months old    [place]  . Esophagogastroduodenoscopy 01/28/2011    Procedure: ESOPHAGOGASTRODUODENOSCOPY (EGD);  Surgeon: Arlyce Harman, MD;  [facility];  Service: Endoscopy;  Laterality: N/A;  . Colonoscopy 01/28/2011    Procedure: COLONOSCOPY;  Surgeon: Arlyce Harman, MD;  [facility];  Service: Endoscopy;  Laterality: N/A;  . Laparotomy 02/04/2011    Procedure: EXPLORATORY LAPAROTOMY;  Surgeon: Fabio Bering;  Location: [facility];  Service: General;  Laterality: N/A;    Family History  Problem Relation Age of Onset  . Cancer Mother     pelvic     Social History:  reports that [name] has never smoked. [name] smokeless  tobacco use includes Chew. [name] reports that [name] does not drink alcohol or use illicit drugs.  Allergies:  Allergies  Allergen Reactions  . Other Swelling    Pinto beans cause swelling and hives  . Penicillins Swelling    Medications: I have reviewed the patient's current medications.  Results for orders placed during the hospital encounter of 02/21/11 (from the past 48 hour(s))  CBC     Status: Abnormal   Collection Time   02/21/11  5:40 AM      Component Value Range Comment   WBC 12.5 (*) 4.0 - 10.5 (K/uL)    RBC 3.68 (*) 3.87 - 5.11 (MIL/uL)    Hemoglobin 11.1 (*) 12.0 - 15.0 (g/dL)    HCT 16.1 (*) 09.6 - 46.0 (%)    MCV 88.3  78.0 - 100.0 (fL)    MCH 30.2  26.0 - 34.0 (pg)    MCHC 34.2  30.0 - 36.0 (g/dL)    RDW 04.5  40.9 - 81.1 (%)    Platelets 473 (*) 150 - 400 (K/uL)   DIFFERENTIAL     Status: Abnormal   Collection Time   02/21/11  5:40 AM      Component Value Range Comment   Neutrophils Relative 87 (*) 43 - 77 (%)    Neutro Abs 10.8 (*) 1.7 - 7.7 (K/uL)    Lymphocytes Relative 8 (*)  12 - 46 (%)    Lymphs Abs 1.0  0.7 - 4.0 (K/uL)    Monocytes Relative 5  3 - 12 (%)    Monocytes Absolute 0.6  0.1 - 1.0 (K/uL)    Eosinophils Relative 1  0 - 5 (%)    Eosinophils Absolute 0.1  0.0 - 0.7 (K/uL)    Basophils Relative 0  0 - 1 (%)    Basophils Absolute 0.0  0.0 - 0.1 (K/uL)   BASIC METABOLIC PANEL     Status: Abnormal   Collection Time   02/21/11  5:40 AM      Component Value Range Comment   Sodium 131 (*) 135 - 145 (mEq/L)    Potassium 3.5  3.5 - 5.1 (mEq/L)    Chloride 95 (*) 96 - 112 (mEq/L)    CO2 26  19 - 32 (mEq/L)    Glucose, Bld 180 (*) 70 - 99 (mg/dL)    BUN 17  6 - 23 (mg/dL)    Creatinine, Ser 1.61  0.50 - 1.10 (mg/dL)    Calcium 8.4  8.4 - 10.5 (mg/dL)    GFR calc non Af Amer 57 (*) >60 (mL/min)    GFR calc Af Amer >60  >60 (mL/min)     No results found.  ROS: See chart Blood pressure 148/84, pulse 111, temperature 98.9 F (37.2 C), temperature  source Oral, resp. rate 20, SpO2 95.00%. Physical Exam: Abdomen: Soft, nontender, nondistended. Positive bowel sounds heard. Incision healing well except for the superior aspect of the incision with induration present. Several staples were removed but no purulent drainage was found. Fascia is intact. No rigidity is noted.  Assessment/Plan: Superficial induration of wound. For [name] to thrive secondary to duodenal ulceration. Plan: The patient received 1 g of vancomycin while the emergency room. From a surgery standpoint, [name] does not need to be admitted for further antibiotic treatment. Vibramycin is suggested to be given as an outpatient. In reviewing the chart, the patient did not qualify for assisted living. I think [name] problems arise from [name] severe duodenitis.  Ellin Fitzgibbons A 02/21/2011, 9:30 AM

## 2011-02-21 NOTE — ED Notes (Signed)
Patient arrives via EMS from home with c/o generalized weakness. Patient reports she has been feeling bad all day. Patient seen in ED earlier and examined by Dr. Bernette Mayers and Dr. Lovell Sheehan and discharged home.  Dr. Bernette Mayers aware of patient return.

## 2011-02-21 NOTE — ED Notes (Signed)
Report given to [place], rn

## 2011-02-21 NOTE — ED Notes (Signed)
Pt has long vertical abd  Incision  With staples in place.  Small amt of pink d/c at top of incision

## 2011-02-21 NOTE — ED Notes (Signed)
Awakened with nausea , feeling hot then cold

## 2011-02-21 NOTE — ED Provider Notes (Signed)
Care assumed at change of shift. Pt with recent laparotomy. Dr. Lovell Sheehan to eval in the ED and make dispo then.   9:43 AM Pt seen by Dr. Lovell Sheehan who does not feel her wound is concerning, agrees that imaging is unlikely to be of benefit. Brief review of the patient's inpatient course shows that there was an attempt at SNF placement, but patient's insurance denied. Pt was offered assisted living, but refused and went home with home health yesterday. She feels like she will not be able to function at home and has reconsidered ALF placement. Spoke with Dr. Lendell Caprice with the hospitalist service who asked that we talk to Social Work to see what they may be able to accomplish from the ED to avoid re-admitting the patient. Awaiting his call back.   11:29 AM Social worker has reviewed chart and spoken with patient. There is no pending beds available in ALF for this patient. She has [facility] scheduled to come to her house for an evaluation and they can arrange outpatient placement if she qualifies. She has no indication for re-admission to the hospital and understands plan at home. Per Surgeon will give Doxy for possible wound infection and nausea meds if needed.   Charles B. Bernette Mayers, MD 02/21/11 1130

## 2011-02-21 NOTE — ED Notes (Signed)
Admitting PA states pt doesn't meet criteria to be admitted. Advised pt that someone needed to stay with pt this weekend. Admitting PA dr. Bonnita Nasuti asked that I call pts step daughter.

## 2011-02-22 ENCOUNTER — Observation Stay (HOSPITAL_COMMUNITY): Payer: Medicare HMO

## 2011-02-22 LAB — HEMOGLOBIN A1C
Hgb A1c MFr Bld: 5.6 % (ref <5.7)
Mean Plasma Glucose: 114 mg/dL (ref <117)

## 2011-02-22 LAB — COMPREHENSIVE METABOLIC PANEL
Alkaline Phosphatase: 98 U/L (ref 39–117)
BUN: 13 mg/dL (ref 6–23)
Chloride: 95 mEq/L — ABNORMAL LOW (ref 96–112)
Creatinine, Ser: 0.61 mg/dL (ref 0.50–1.10)
GFR calc Af Amer: >60 mL/min (ref >60)
GFR calc non Af Amer: >60 mL/min (ref >60)
Glucose, Bld: 172 mg/dL — ABNORMAL HIGH (ref 70–99)
Potassium: 3.2 mEq/L — ABNORMAL LOW (ref 3.5–5.1)
Total Bilirubin: 0.3 mg/dL (ref 0.3–1.2)

## 2011-02-22 LAB — CARDIAC PANEL(CRET KIN+CKTOT+MB+TROPI)
CK, MB: 2.8 ng/mL (ref 0.3–4.0)
Total CK: 23 U/L (ref 7–177)
Troponin I: <0.3 ng/mL (ref <0.30)

## 2011-02-22 LAB — GLUCOSE, CAPILLARY
Glucose-Capillary: 182 mg/dL — ABNORMAL HIGH (ref 70–99)
Glucose-Capillary: 168 mg/dL — ABNORMAL HIGH (ref 70–99)

## 2011-02-22 LAB — CBC
HCT: 28.6 % — ABNORMAL LOW (ref 36.0–46.0)
Hemoglobin: 9.8 g/dL — ABNORMAL LOW (ref 12.0–15.0)
MCHC: 34.3 g/dL (ref 30.0–36.0)
MCV: 86.4 fL (ref 78.0–100.0)
WBC: 14.1 10*3/uL — ABNORMAL HIGH (ref 4.0–10.5)

## 2011-02-22 MED ORDER — HYDROMORPHONE HCL 1 MG/ML IJ SOLN
1.0000 mg | INTRAMUSCULAR | Status: DC | PRN
  Administered 2011-02-22 – 2011-02-26 (×9): 1 mg via INTRAVENOUS
  Filled 2011-02-22 (×9): qty 1

## 2011-02-22 MED ORDER — BIOTENE DRY MOUTH MT LIQD: OROMUCOSAL | Status: DC | PRN

## 2011-02-22 MED ORDER — SODIUM CHLORIDE 0.9 % IJ SOLN
INTRAMUSCULAR | Status: AC
  Administered 2011-02-22: 02:00:00
  Filled 2011-02-22: qty 3

## 2011-02-22 MED ORDER — SODIUM CHLORIDE 0.9 % IJ SOLN
INTRAMUSCULAR | Status: AC
  Administered 2011-02-22: 10 mL
  Filled 2011-02-22: qty 10

## 2011-02-22 MED ORDER — PANTOPRAZOLE SODIUM 40 MG PO TBEC
40.0000 mg | DELAYED_RELEASE_TABLET | Freq: Two times a day (BID) | ORAL | Status: DC
  Administered 2011-02-22 – 2011-02-23 (×2): 40 mg via ORAL
  Filled 2011-02-22 (×2): qty 1

## 2011-02-22 MED ORDER — OXYCODONE HCL 5 MG PO TABS
5.0000 mg | ORAL_TABLET | ORAL | Status: DC | PRN
  Administered 2011-02-22 – 2011-02-23 (×3): 5 mg via ORAL
  Administered 2011-02-23: 10 mg via ORAL
  Administered 2011-02-24 (×2): 5 mg via ORAL
  Administered 2011-02-25 – 2011-02-26 (×3): 10 mg via ORAL
  Administered 2011-02-26: 5 mg via ORAL
  Administered 2011-02-26 – 2011-02-27 (×3): 10 mg via ORAL
  Filled 2011-02-22: qty 2
  Filled 2011-02-22 (×3): qty 1
  Filled 2011-02-22: qty 2
  Filled 2011-02-22 (×2): qty 1
  Filled 2011-02-22 (×4): qty 2
  Filled 2011-02-22: qty 1
  Filled 2011-02-22: qty 2

## 2011-02-22 NOTE — Progress Notes (Signed)
Chart reviewed. Discussed with Child psychotherapist. According to the nurse, the patient's wound has soaked several dressings.  Subjective: Complains of pain. She remembers me. She reports having had nausea vomiting at home. She confirms that she is willing to be placed. Complaining of heartburn.  Objective: Vital signs in last 24 hours: Filed Vitals:   02/22/11 0241 02/22/11 0500 02/22/11 0600 02/22/11 1509  BP:   147/85 154/84  Pulse:   108 104  Temp:   98.8 F (37.1 C) 98.7 F (37.1 C)  TempSrc:   Oral Oral  Resp:   20 18  Height:      Weight:  87.5 kg (192 lb 14.4 oz)    SpO2: 95%  97% 94%   Weight change:   Intake/Output Summary (Last 24 hours) at 02/22/11 1525 Last data filed at 02/22/11 0500  Gross per 24 hour  Intake    240 ml  Output      0 ml  Net    240 ml   The patient is lying on her side moaning and holding her abdomen. She appears her very uncomfortable. She is appropriate and remembers the details of her last hospitalization. She has reasonable understanding of her condition Lungs clear to auscultation bilaterally without wheezes rhonchi or rales Cardiovascular regular rate rhythm without murmurs gallops rubs Abdomen her incision is draining serosanguineous fluid with mild erythema superiorly. There is no purulence. abdomen is soft Extremities: No clubbing cyanosis or edema   Lab Results: Basic Metabolic Panel:  Lab 02/22/11 0981 02/21/11 1622 02/19/11 0502 02/17/11 0606  NA 127* 130* -- --  K 3.2* 4.1 -- --  CL 95* 93* -- --  CO2 22 25 -- --  GLUCOSE 172* 191* -- --  BUN 13 22 -- --  CREATININE 0.61 0.94 -- --  CALCIUM 7.9* 8.7 -- --  MG -- -- 1.7 2.0  PHOS -- -- -- --   Liver Function Tests:  Lab 02/22/11 0457 02/21/11 1622  AST 11 11  ALT 9 9  ALKPHOS 98 106  BILITOT 0.3 0.2*  PROT 5.8* 5.6*  ALBUMIN 2.4* 2.4*    Lab 02/21/11 1622 02/19/11 0502  LIPASE 17 18  AMYLASE -- --   CBC:  Lab 02/22/11 0457 02/21/11 1622 02/21/11 0540  WBC 14.1*  15.7* --  NEUTROABS -- 13.6* 10.8*  HGB 9.8* 10.6* --  HCT 28.6* 31.0* --  MCV 86.4 88.3 --  PLT 353 477* --   Cardiac Enzymes:  Lab 02/22/11 0019 02/17/11 1650 02/17/11 0840  CKTOTAL 23 31 31   CKMB 2.8 1.9 1.9  CKMBINDEX -- -- --  TROPONINI <0.30 <0.30 <0.30   BNP: No results found for this basename: POCBNP:3 in the last 168 hours D-Dimer: No results found for this basename: DDIMER:2 in the last 168 hours CBG:  Lab 02/22/11 1153 02/22/11 0725 02/20/11 1739 02/20/11 1148 02/20/11 0823 02/20/11 0416  GLUCAP 168* 182* 151* 151* 150* 154*   Hemoglobin A1C: No results found for this basename: HGBA1C in the last 168 hours Fasting Lipid Panel: No results found for this basename: CHOL,HDL,LDLCALC,TRIG,CHOLHDL,LDLDIRECT in the last 191 hours Thyroid Function Tests: No results found for this basename: TSH,T4TOTAL,FREET4,T3FREE,THYROIDAB in the last 168 hours Anemia Panel: No results found for this basename: VITAMINB12,FOLATE,FERRITIN,TIBC,IRON,RETICCTPCT in the last 168 hours   Micro Results: No results found for this or any previous visit (from the past 240 hour(s)). Studies/Results: X-ray Chest Pa And Lateral   02/22/2011  *RADIOLOGY REPORT*  Clinical Data: Leukocytosis  CHEST -  2 VIEW  Comparison: 02/10/2011  Findings: Persistent low lung volumes with minimal patchy subsegmental atelectasis at the lung bases.  The central line has been removed.  Heart size upper limits normal.  Tortuous thoracic aorta.  No effusion.  Vascular clips in the upper abdomen.  IMPRESSION:  1.  Stable mild bibasilar atelectasis.  No acute disease.  Original Report Authenticated By: Osa Craver, M.D.   Ct Abdomen Pelvis W Contrast  02/21/2011  *RADIOLOGY REPORT*  Clinical Data: Abdominal pain.  Recent upper GI bleed requiring coil embolization of the gastroduodenal artery.  Recent surgery of unknown type.  CT ABDOMEN AND PELVIS WITH CONTRAST  Technique:  Multidetector CT imaging of the abdomen  and pelvis was performed following the standard protocol during bolus administration of intravenous contrast.  Contrast: 100 ml Omnipaque 300 IV.  Comparison: 01/27/2011  Findings: There is marked edema, inflammatory stranding and wall thickening involving the distal stomach and proximal duodenum. Stranding noted within the adjacent omentum/mesentery and overlying anterior abdominal wall.  This presumably represents postoperative changes from recent surgery.  Recommend clinical correlation for recent gastric or duodenal surgery.  Coils and/or staples are seen in the region of the proximal duodenum.  Liver, spleen, pancreas, adrenals are unremarkable.  Small low- density lesions in the kidneys, likely cysts.  No hydronephrosis.  Descending colonic and sigmoid diverticula.  No active diverticulitis.  Urinary bladder is grossly unremarkable.  Uterus and adnexa grossly unremarkable.  Aorta is normal caliber.  Minimal right base atelectasis.  No effusions.  Small hiatal hernia present.  IMPRESSION: Marked wall thickening and surrounding edema/stranding around the distal stomach and proximal duodenum.  This is presumably related to recent gastric/duodenal surgery.  Recommend clinical correlation.  Original Report Authenticated By: Cyndie Chime, M.D.   Medications: I have reviewed the patient's current medications. Scheduled Meds:   . sodium chloride   Intravenous Once  . sodium chloride   Intravenous STAT  . folic acid  1 mg Oral Daily  . insulin aspart  0-5 Units Subcutaneous QHS  . insulin aspart  0-9 Units Subcutaneous TID WC  . morphine  4 mg Intravenous Once  . ondansetron  4 mg Intravenous Once  . sodium chloride  500 mL Intravenous Once  . sodium chloride       Continuous Infusions:  PRN Meds:.acetaminophen, acetaminophen, antiseptic oral rinse, bisacodyl, HYDROmorphone, iohexol, ondansetron (ZOFRAN) IV, ondansetron, oxyCODONE, polyethylene glycol, sodium phosphate,  traZODone Assessment/Plan: Principal Problem:  *Confusion: The patient appears appropriate and at her baseline currently. No need for further work up at this time.   STATUS post recent exploratory laparotomy and FNA of the pancreas for bleeding: The patient's wound appears similar to last week. Dr. Lovell Sheehan saw the patient yesterday and had recommended doxycycline for the wound infection. I will start this here. The patient has had no complaints of vomiting here. I will advance her diet. Her pain is poorly controlled. I will increase her pain medications and treat her heartburn with proton pump inhibitor and Carafate. Status post coiling of the gastroduodenal artery: Monitor hemoglobin. Patient has had no reported gastrointestinal bleeding. DIABETES MELLITUS, TYPE II  HYPERLIPIDEMIA  Obesity, unspecified  HYPERTENSION  UNSTEADY GAIT  Other urinary incontinence  Anemia  Hyponatremia Patient is agreeable to placement. She does not appear appropriate for assisted living at this time. Physical therapy will need to be involved again and I maintain that she needs to go to skilled nursing facility. She has failed a trial at discharge home.  In reviewing physical therapy notes, it was felt that she was assessed to require skilled nursing facility by physical therapy.    LOS: 1 day   [name] L 02/22/2011, 3:25 PM

## 2011-02-23 LAB — GLUCOSE, CAPILLARY
Glucose-Capillary: 134 mg/dL — ABNORMAL HIGH (ref 70–99)
Glucose-Capillary: 131 mg/dL — ABNORMAL HIGH (ref 70–99)

## 2011-02-23 LAB — CBC
HCT: 25.4 % — ABNORMAL LOW (ref 36.0–46.0)
Hemoglobin: 8.5 g/dL — ABNORMAL LOW (ref 12.0–15.0)
MCH: 29.2 pg (ref 26.0–34.0)
MCV: 87.3 fL (ref 78.0–100.0)
RBC: 2.91 MIL/uL — ABNORMAL LOW (ref 3.87–5.11)

## 2011-02-23 LAB — BASIC METABOLIC PANEL
CO2: 25 mEq/L (ref 19–32)
Chloride: 96 mEq/L (ref 96–112)
GFR calc non Af Amer: >60 mL/min (ref >60)
Glucose, Bld: 149 mg/dL — ABNORMAL HIGH (ref 70–99)
Potassium: 3.1 mEq/L — ABNORMAL LOW (ref 3.5–5.1)
Sodium: 129 mEq/L — ABNORMAL LOW (ref 135–145)

## 2011-02-23 LAB — MAGNESIUM: Magnesium: 1.6 mg/dL (ref 1.5–2.5)

## 2011-02-23 LAB — HEMOGLOBIN: Hemoglobin: 8.9 g/dL — ABNORMAL LOW (ref 12.0–15.0)

## 2011-02-23 MED ORDER — POTASSIUM CHLORIDE 10 MEQ/100ML IV SOLN
10.0000 meq | INTRAVENOUS | Status: AC
  Administered 2011-02-23 (×4): 10 meq via INTRAVENOUS
  Filled 2011-02-23 (×2): qty 100

## 2011-02-23 MED ORDER — PANTOPRAZOLE SODIUM 40 MG IV SOLR
40.0000 mg | Freq: Two times a day (BID) | INTRAVENOUS | Status: DC
  Administered 2011-02-23 – 2011-02-24 (×2): 40 mg via INTRAVENOUS
  Filled 2011-02-23 (×2): qty 40

## 2011-02-23 MED ORDER — POTASSIUM CHLORIDE 10 MEQ/100ML IV SOLN
INTRAVENOUS | Status: AC
  Administered 2011-02-23: 10 meq via INTRAVENOUS
  Filled 2011-02-23: qty 200

## 2011-02-23 NOTE — Discharge Summary (Signed)
NAME:  [name], [name]                       ACCOUNT NO.:  MEDICAL RECORD NO.:  [id number]  LOCATION:                                 FACILITY:  PHYSICIAN:  Elliot Cousin, M.D.    DATE OF BIRTH:  04-23-1932  DATE OF ADMISSION:  01/27/2011 DATE OF DISCHARGE:  08/24/2012LH                         DISCHARGE SUMMARY-REFERRING   DISCHARGE DIAGNOSES: 1. Pancreatic mass involving the first and second portion of the     duodenum per exploratory laparotomy on February 04, 2011, by Dr. Tilford Pillar.  The FNA pathology report revealed benign reactive     changes. 2. Large ulcerated gastric mass extending from the antrum to the     duodenal bulb per esophagogastroduodenoscopy on January 28, 2011, by     Dr. Darrick Penna.  Status post biopsy, which revealed acute ulceration     with abundant Helicobacter pylori bacteria.  No evidence of     malignancy. 3. Ascending colon polyp, ileocecal valve ulcer, diverticula, and     internal hemorrhoids per colonoscopy on January 28, 2011, by Dr.     [name].  Status post biopsy of the polyp, which revealed     tubular adenoma and no malignancy. 4. Status post mesenteric angiogram with successful coil embolization     of the gastroduodenal artery by Dr. Oley Balm, interventional     radiologist, on February 10, 2011. 5. Incisional abdominal cellulitis. 6. Acute blood loss anemia, status post 8 units of packed red blood     cell transfusions and status post 2 units of fresh frozen plasma.     Her hemoglobin was 6.7 on admission and 11.1 at the time of     discharge. 7. Shortness of breath and tachycardia, with CT angiogram of the     chest, negative for pulmonary embolism. 8. Type 2 diabetes mellitus with a hemoglobin A1c of 7.7. 9. Persistent hyponatremia, etiology multifactorial.  Her serum sodium     was 125 at the time of hospital admission and 133 at the time of     hospital discharge. 10.Azotemia, secondary to GI bleeding.  Her BUN was 55 and her   creatinine was 0.65 on admission.  At the time of discharge, her     BUN was 7 and her creatinine was 0.72. 11.Hypertension. 12.Hyperlipidemia. 13.Gastroesophageal reflux disease. 14.Hypokalemia, repleted.  Her serum potassium was 3.5 at the time of     discharge. 15.History of hepatic steatosis per CT scan of the abdomen and pelvis. 16.Debility/deconditioning, but strong enough per the physical     therapist to be discharged home with home health therapy. 17.Presenting syncope and generalized weakness, secondary to profound     anemia.  CT scan of the head revealed no acute changes. 18.History of ulcerative colitis, pancreatitis, and elevated gastrin     levels in 2009.  Lost to followup. 19.Mild dementia. 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 21.Status post open reduction and internal fixation of the right hip     in 1999. 22.Status post umbilical hernia repair.  DISCHARGE MEDICATIONS: 1. Amlodipine 5 mg daily. 2. Benazepril 20 mg daily. 3. Enablex 15  mg daily. 4. Nexium 40 mg b.i.d. 5. Glipizide 10 mg daily.  The patient was advised to take 1 tablet     daily instead of 1 tablet b.i.d.  She was advised to not take this     medication if her blood sugar was less than 130. 6. Hydrocodone/acetaminophen 5/325 mg 1 tablet every 4 hours as needed     for pain. 7. Lantus insulin 15 units b.i.d. subcutaneously. 8. Metformin 1000 mg half a tablet b.i.d., decreased from 1 tablet     b.i.d. 9. Multivitamin once daily. 10.MiraLax laxative daily. 11.Potassium chloride 20 mEq b.i.d.  DISCHARGE DISPOSITION:  The patient was discharged home in stable condition on February 20, 2011.  An appointment was made for her to follow up with her primary care physician, Dr. [name] next week. Following discharge, the patient was informed that arrangements will be made for her to follow up with gastroenterologist, Dr. [name] and with general surgeon, Dr. Tilford Pillar.  CONSULTATIONS: 1. [name], M.D. 2. R. Roetta Sessions, MD FACP [facility] 3. Tilford Pillar, MD 4. Interventional radiologist, Oley Balm, M.D.  ADDITIONAL PROCEDURES PERFORMED: 1. Status post central line insertion on February 10, 2011, by Dr. Tilford Pillar. 2. All other procedures were dictated above in the discharge diagnosis     section.  HISTORY OF PRESENT ILLNESS:  The patient is a 77 year old woman with a past medical history significant erosive esophagitis, pancreatitis, type 2 diabetes mellitus, and hypertension.  She presented to the emergency department on January 27, 2011, with a chief complaint of generalized weakness and a syncopal episode.  During the initial evaluation in the emergency department, she was noted to be mildly tachycardic,  but otherwise hemodynamically stable and afebrile.  Her lab data were significant for a serum sodium of 125, normal lipase of 22,  elevated white blood cell count of 18, and a hemoglobin of 6.7. The CT scan of her head revealed no acute intracranial abnormality, but stable cerebral and cerebellar atrophy and chronic small vessel disease. Her chest x-ray revealed no active cardiopulmonary disease.  She was admitted for further evaluation and management.  HOSPITAL COURSE:  The patient was transfused 2 units of packed red blood cells during the first 24 hours of the hospitalization.  She was also given 1 unit of fresh frozen plasma.  Intravenous Protonix was administered every 12 hours.  Her hemoglobin and hematocrit were followed closely.  She was started on IV fluid hydration for hyponatremia and azotemia.  Gastroenterologist, Dr. [name] was consulted for further evaluation.  Shortly thereafter, Dr. Darrick Penna performed an EGD and colonoscopy.  The results were dictated above.  In essence, the patient had a large ulcerated gastric mass extending into the antrum and duodenal bulb, concerning for malignancy.   The colonoscopy revealed a polyp and ileocecal valve ulcer.  Biopsies were taken of the sites above.  Surprisingly, the gastric mass pathology revealed only reactive changes and a significant amount of H. pylori. There was no evidence of malignancy or dysplasia.  The colon polyp and ileocecal valve ulcer path revealed a tubular adenoma and reactive changes respectively, but no malignancy.  The patient has a penicillin allergy. Dr. Darrick Penna recommended starting Pylera once the patient was able to take oral medications without significant nausea, vomiting, and abdominal pain. Over the course of the hospitalization, the patient's hemoglobin and hematocrit fell repeatedly.  She was transfused additional units of packed red blood cells.  At one point,  she became hypotensive and tachycardic.  This was followed by an episode of hematemesis.  She was transfused more packed red blood cells and given one more unit of fresh frozen plasma.  She was moved to the ICU for closer monitoring.  Dr. Leticia Penna was consulted.  He evaluated the patient and believed that she would likely need a partial gastrectomy for what was obviously a bleeding ulcerated gastric mass.  Dr. Leticia Penna took the patient to the OR on February 04, 2011.  During the operation, he found that the patient also had a pancreatic mass,  which was extending into the first and second portion of the duodenum.  At that point, he performed an FNA for diagnostic reasons.  Eventually, the pathology report revealed benign reactive changes.  Several tumor markers were ordered by the gastroenterology and surgery teams.  All of the tumor markers were relatively within normal limits.  Following the discovery of the pancreatic mass, potential management with the Whipple  procedure was entertained.  Dr. Leticia Penna discussed the patient's clinical fihdings with the general surgeons at [facility].  Following their consultation,  it was believed that the patient will be a very poor candidate for a Whipple procedure given her debility, age, and mild dementia.  Nevertheless, the patient continued to have active bleeding as her hemoglobin fell below 8 on several more occasions following the exploratory laparotomy.  She was transfused again.  In all, she was transfused 8 units of packed red blood cells.  Following the evidence of ongoing bleeding, Dr. Leticia Penna conferred with the interventional radiologist at [facility] for a potential embolization of the suspected bleeding artery.  She was transferred to [facility] where Dr. Oley Balm, interventional radiologist, performed a coil embolization of the gastroduodenal artery successful.  Following this procedure, the patient's hemoglobin and hematocrit remained completely stable for the remainder of the hospitalization.  The patient's incisional wound with staples revealed cellulitis several days following the operation.  She was started on a number of antibiotics including vancomycin and Levaquin.  She was subsequently transitioned to Teflaro intravenously.  In all, she was treated with antibiotics for approximately 1-1/2-2 weeks.  Upon discharge, the cellulitis had resolved, although she did continue to have nonpurulent drainage from the incisional site.  The nursing staff changed her dressings daily along with Dr. Leticia Penna.  At the time of discharge, she was afebrile and her white blood cell count was within normal limits. She was not discharged on antibiotic therapy as what she received during the hospitalization was felt to be adequate.  She continued to eat marginally.  Some days she ate 50%, but for the most part, her intake was below 50%.  Ensure supplements were offered three times daily.  There was a dilemma regarding when to start Pylera given that she had intermittent nausea and a poor intake.  I discussed this with gastroenterologist, Dr.  Jena Gauss.  We were both in agreement that there needed to be a steady state of fairly decent intake before starting Pylera.  It was obvious that the patient needed Pylera for H. pylori infection treatment.  Throughout the hospitalization, the patient's blood pressure, diabetes, and electrolyte abnormality stabilized.  She was repleted with potassium chloride orally and in the IV fluids.  For the most part, her electrolytes were approaching normal range.  Her blood pressure did increase a little, but improved once her antihypertensive medications were restarted.  There was fairly good control of her diabetes throughout the  hospitalization.  Insulin was adjusted accordingly.  DISPOSITION:  The patient was evaluated by the physical therapist on multiple occasions.  Initially, it was felt that she was a skilled nursing facility candidate for rehabilitation.  However, toward the end of the hospitalization, she was ambulating fairly well with the physical therapist.  Although all involved in her care, recommended that she be transferred for skilled nursing facility placement because she lived alone, the insurance coverage would not agree. Assisted living facility placement was offered as an option for the patient, however, she refused.  She was, therefore, discharged home in stable condition.     Elliot Cousin, M.D.     DF/MEDQ  D:  02/22/2011  T:  02/22/2011  Job:  [id number]  cc:   Milus Mallick. Lodema Hong, M.D. Fax: [phone]  Tilford Pillar, MD Fax: [phone]  [name], M.D. [address] [place] , [place] [address]

## 2011-02-23 NOTE — Progress Notes (Signed)
Spiritual Care note-- Tried to visit with patient at 4pm and again at 5:30.  She was resting both times and we spoke only briefly.  Will follow up for support and discussion of care.

## 2011-02-23 NOTE — Consult Note (Signed)
Reason for Consult:Abd pain, anemia, nausea, recent OR   Referring Physician: [facility]  [name] is an 77 y.o. female.  HPI: Pt well known to me after recent exploration for bleeding ulcerative mass.  This had involvement of the pancreatic head and did not allow for definitive surgical intervention.  She did undergo IR embolization which seemed to work very well.  As she progressed, it was decided that we would continue on current course unless re bleeding issues arose.  If this should happen then she would be referred to [facility] for further evaluation.  She was discharge over the weekend.  Unfortunately pt states that she developed increasing nausea over the weekend.  Some non-bloody emesis was reported.  Her bowel function has persisted without evidence for melena or hematochezia.  Nausea is slightly improved currently.  She has had some LUQ and mild RUQ tenderness.  This has not worsened or changed with PO intake.  She has not had any fevers or chills.  No diarrhea.      Past Medical History  Diagnosis Date  . Hypertension   . Reflux   . Right elbow pain     OTIF  . Dementia   . Depression   . Osteoarthritis   . Pancreatitis 11/2007    [facility]  . Angiomyolipoma of kidney     right  . Ulcerative esophagitis 12/05/2007    hx elevated gastrin, severe on EGD by Dr Jena Gauss , h pylori negative  . Hiatal hernia   . S/P colonoscopy 2009    pt reports normal by Dr Lovell Sheehan  . Diabetes mellitus   . Anemia     Past Surgical History  Procedure Date  . Orif right hip 1999    [facility]  . Umbilical hernia repair 64 months old    [place]  . Esophagogastroduodenoscopy 01/28/2011    Procedure: ESOPHAGOGASTRODUODENOSCOPY (EGD);  Surgeon: Arlyce Harman, MD;  Location: [facility];  Service: Endoscopy;  Laterality: N/A;  . Colonoscopy 01/28/2011    Procedure: COLONOSCOPY;  Surgeon: Arlyce Harman, MD;  Location: [facility];  Service: Endoscopy;  Laterality: N/A;  . Laparotomy 02/04/2011   Procedure: EXPLORATORY LAPAROTOMY;  Surgeon: Fabio Bering;  Location: [facility];  Service: General;  Laterality: N/A;    Family History  Problem Relation Age of Onset  . Cancer Mother     pelvic     Social History:  reports that she has never smoked. Her smokeless tobacco use includes Chew. She reports that she does not drink alcohol or use illicit drugs.  Allergies:  Allergies  Allergen Reactions  . Other Swelling    Pinto beans cause swelling and hives  . Penicillins Swelling    Medications:  Scheduled:   . sodium chloride   Intravenous Once  . insulin aspart  0-5 Units Subcutaneous QHS  . insulin aspart  0-9 Units Subcutaneous TID WC  . pantoprazole  40 mg Oral BID AC  . pantoprazole (PROTONIX) IV  40 mg Intravenous Q12H  . potassium chloride  10 mEq Intravenous Q1 Hr x 4  . sodium chloride      . DISCONTD: folic acid  1 mg Oral Daily   Continuous:  WUJ:[id number], antiseptic oral rinse, bisacodyl, HYDROmorphone, ondansetron (ZOFRAN) IV, ondansetron, oxyCODONE, polyethylene glycol, sodium phosphate, traZODone, DISCONTD: acetaminophen, DISCONTD: HYDROmorphone, DISCONTD: oxyCODONE  Results for orders placed during the hospital encounter of 02/21/11 (from the past 48 hour(s))  CBC     Status: Abnormal  Collection Time   02/21/11  4:22 PM      Component Value Range Comment   WBC 15.7 (*) 4.0 - 10.5 (K/uL)    RBC 3.51 (*) 3.87 - 5.11 (MIL/uL)    Hemoglobin 10.6 (*) 12.0 - 15.0 (g/dL)    HCT 16.1 (*) 09.6 - 46.0 (%)    MCV 88.3  78.0 - 100.0 (fL)    MCH 30.2  26.0 - 34.0 (pg)    MCHC 34.2  30.0 - 36.0 (g/dL)    RDW 04.5  40.9 - 81.1 (%)    Platelets 477 (*) 150 - 400 (K/uL)   DIFFERENTIAL     Status: Abnormal   Collection Time   02/21/11  4:22 PM      Component Value Range Comment   Neutrophils Relative 87 (*) 43 - 77 (%)    Neutro Abs 13.6 (*) 1.7 - 7.7 (K/uL)    Lymphocytes Relative 10 (*) 12 - 46 (%)    Lymphs Abs 1.5  0.7 - 4.0 (K/uL)    Monocytes Relative 4   3 - 12 (%)    Monocytes Absolute 0.6  0.1 - 1.0 (K/uL)    Eosinophils Relative 0  0 - 5 (%)    Eosinophils Absolute 0.0  0.0 - 0.7 (K/uL)    Basophils Relative 0  0 - 1 (%)    Basophils Absolute 0.0  0.0 - 0.1 (K/uL)   COMPREHENSIVE METABOLIC PANEL     Status: Abnormal   Collection Time   02/21/11  4:22 PM      Component Value Range Comment   Sodium 130 (*) 135 - 145 (mEq/L)    Potassium 4.1  3.5 - 5.1 (mEq/L)    Chloride 93 (*) 96 - 112 (mEq/L)    CO2 25  19 - 32 (mEq/L)    Glucose, Bld 191 (*) 70 - 99 (mg/dL)    BUN 22  6 - 23 (mg/dL)    Creatinine, Ser 9.14  0.50 - 1.10 (mg/dL)    Calcium 8.7  8.4 - 10.5 (mg/dL)    Total Protein 5.6 (*) 6.0 - 8.3 (g/dL)    Albumin 2.4 (*) 3.5 - 5.2 (g/dL)    AST 11  0 - 37 (U/L)    ALT 9  0 - 35 (U/L)    Alkaline Phosphatase 106  39 - 117 (U/L)    Total Bilirubin 0.2 (*) 0.3 - 1.2 (mg/dL)    GFR calc non Af Amer 58 (*) >60 (mL/min)    GFR calc Af Amer >60  >60 (mL/min)   LIPASE, BLOOD     Status: Normal   Collection Time   02/21/11  4:22 PM      Component Value Range Comment   Lipase 17  11 - 59 (U/L)   URINALYSIS, ROUTINE W REFLEX MICROSCOPIC     Status: Normal   Collection Time   02/21/11  6:45 PM      Component Value Range Comment   Color, Urine YELLOW  YELLOW     Appearance CLEAR  CLEAR     Specific Gravity, Urine 1.010  1.005 - 1.030     pH 6.0  5.0 - 8.0     Glucose, UA NEGATIVE  NEGATIVE (mg/dL)    Hgb urine dipstick NEGATIVE  NEGATIVE     Bilirubin Urine NEGATIVE  NEGATIVE     Ketones, ur NEGATIVE  NEGATIVE (mg/dL)    Protein, ur NEGATIVE  NEGATIVE (mg/dL)    Urobilinogen, UA  0.2  0.0 - 1.0 (mg/dL)    Nitrite NEGATIVE  NEGATIVE     Leukocytes, UA NEGATIVE  NEGATIVE  MICROSCOPIC NOT DONE ON URINES WITH NEGATIVE PROTEIN, BLOOD, LEUKOCYTES, NITRITE, OR GLUCOSE <1000 mg/dL.  HEMOGLOBIN A1C     Status: Normal   Collection Time   02/22/11 12:18 AM      Component Value Range Comment   Hemoglobin A1C 5.6  <5.7 (%)    Mean Plasma  Glucose 114  <117 (mg/dL)   CARDIAC PANEL(CRET KIN+CKTOT+MB+TROPI)     Status: Normal   Collection Time   02/22/11 12:19 AM      Component Value Range Comment   Total CK 23  7 - 177 (U/L)    CK, MB 2.8  0.3 - 4.0 (ng/mL)    Troponin I <0.30  <0.30 (ng/mL)    Relative Index RELATIVE INDEX IS INVALID  0.0 - 2.5    CBC     Status: Abnormal   Collection Time   02/22/11  4:57 AM      Component Value Range Comment   WBC 14.1 (*) 4.0 - 10.5 (K/uL) WHITE COUNT CONFIRMED ON SMEAR   RBC 3.31 (*) 3.87 - 5.11 (MIL/uL)    Hemoglobin 9.8 (*) 12.0 - 15.0 (g/dL)    HCT 11.9 (*) 14.7 - 46.0 (%)    MCV 86.4  78.0 - 100.0 (fL)    MCH 29.6  26.0 - 34.0 (pg)    MCHC 34.3  30.0 - 36.0 (g/dL)    RDW 82.9  56.2 - 13.0 (%)    Platelets 353  150 - 400 (K/uL)   COMPREHENSIVE METABOLIC PANEL     Status: Abnormal   Collection Time   02/22/11  4:57 AM      Component Value Range Comment   Sodium 127 (*) 135 - 145 (mEq/L)    Potassium 3.2 (*) 3.5 - 5.1 (mEq/L)    Chloride 95 (*) 96 - 112 (mEq/L)    CO2 22  19 - 32 (mEq/L)    Glucose, Bld 172 (*) 70 - 99 (mg/dL)    BUN 13  6 - 23 (mg/dL)    Creatinine, Ser 8.65  0.50 - 1.10 (mg/dL)    Calcium 7.9 (*) 8.4 - 10.5 (mg/dL)    Total Protein 5.8 (*) 6.0 - 8.3 (g/dL)    Albumin 2.4 (*) 3.5 - 5.2 (g/dL)    AST 11  0 - 37 (U/L)    ALT 9  0 - 35 (U/L)    Alkaline Phosphatase 98  39 - 117 (U/L)    Total Bilirubin 0.3  0.3 - 1.2 (mg/dL)    GFR calc non Af Amer >60  >60 (mL/min)    GFR calc Af Amer >60  >60 (mL/min)   GLUCOSE, CAPILLARY     Status: Abnormal   Collection Time   02/22/11  7:25 AM      Component Value Range Comment   Glucose-Capillary 182 (*) 70 - 99 (mg/dL)   GLUCOSE, CAPILLARY     Status: Abnormal   Collection Time   02/22/11 11:53 AM      Component Value Range Comment   Glucose-Capillary 168 (*) 70 - 99 (mg/dL)   GLUCOSE, CAPILLARY     Status: Abnormal   Collection Time   02/22/11  4:28 PM      Component Value Range Comment   Glucose-Capillary  159 (*) 70 - 99 (mg/dL)   GLUCOSE, CAPILLARY     Status: Abnormal  Collection Time   02/22/11  9:16 PM      Component Value Range Comment   Glucose-Capillary 156 (*) 70 - 99 (mg/dL)    Comment 1 Notify RN     GLUCOSE, CAPILLARY     Status: Abnormal   Collection Time   02/23/11  7:17 AM      Component Value Range Comment   Glucose-Capillary 141 (*) 70 - 99 (mg/dL)    Comment 1 Notify RN     BASIC METABOLIC PANEL     Status: Abnormal   Collection Time   02/23/11  7:53 AM      Component Value Range Comment   Sodium 129 (*) 135 - 145 (mEq/L)    Potassium 3.1 (*) 3.5 - 5.1 (mEq/L)    Chloride 96  96 - 112 (mEq/L)    CO2 25  19 - 32 (mEq/L)    Glucose, Bld 149 (*) 70 - 99 (mg/dL)    BUN 12  6 - 23 (mg/dL)    Creatinine, Ser 1.61  0.50 - 1.10 (mg/dL)    Calcium 8.3 (*) 8.4 - 10.5 (mg/dL)    GFR calc non Af Amer >60  >60 (mL/min)    GFR calc Af Amer >60  >60 (mL/min)   CBC     Status: Abnormal   Collection Time   02/23/11  7:53 AM      Component Value Range Comment   WBC 8.9  4.0 - 10.5 (K/uL)    RBC 2.91 (*) 3.87 - 5.11 (MIL/uL)    Hemoglobin 8.5 (*) 12.0 - 15.0 (g/dL)    HCT 09.6 (*) 04.5 - 46.0 (%)    MCV 87.3  78.0 - 100.0 (fL)    MCH 29.2  26.0 - 34.0 (pg)    MCHC 33.5  30.0 - 36.0 (g/dL)    RDW 40.9  81.1 - 91.4 (%)    Platelets 381  150 - 400 (K/uL)   GLUCOSE, CAPILLARY     Status: Abnormal   Collection Time   02/23/11  8:18 AM      Component Value Range Comment   Glucose-Capillary 147 (*) 70 - 99 (mg/dL)    Comment 1 Notify RN     GLUCOSE, CAPILLARY     Status: Abnormal   Collection Time   02/23/11 11:10 AM      Component Value Range Comment   Glucose-Capillary 134 (*) 70 - 99 (mg/dL)    Comment 1 Notify RN       X-ray Chest Pa And Lateral   02/22/2011  *RADIOLOGY REPORT*  Clinical Data: Leukocytosis  CHEST - 2 VIEW  Comparison: 02/10/2011  Findings: Persistent low lung volumes with minimal patchy subsegmental atelectasis at the lung bases.  The central line has been  removed.  Heart size upper limits normal.  Tortuous thoracic aorta.  No effusion.  Vascular clips in the upper abdomen.  IMPRESSION:  1.  Stable mild bibasilar atelectasis.  No acute disease.  Original Report Authenticated By: Osa Craver, M.D.   Ct Abdomen Pelvis W Contrast  02/21/2011  *RADIOLOGY REPORT*  Clinical Data: Abdominal pain.  Recent upper GI bleed requiring coil embolization of the gastroduodenal artery.  Recent surgery of unknown type.  CT ABDOMEN AND PELVIS WITH CONTRAST  Technique:  Multidetector CT imaging of the abdomen and pelvis was performed following the standard protocol during bolus administration of intravenous contrast.  Contrast: 100 ml Omnipaque 300 IV.  Comparison: 01/27/2011  Findings: There is marked edema,  inflammatory stranding and wall thickening involving the distal stomach and proximal duodenum. Stranding noted within the adjacent omentum/mesentery and overlying anterior abdominal wall.  This presumably represents postoperative changes from recent surgery.  Recommend clinical correlation for recent gastric or duodenal surgery.  Coils and/or staples are seen in the region of the proximal duodenum.  Liver, spleen, pancreas, adrenals are unremarkable.  Small low- density lesions in the kidneys, likely cysts.  No hydronephrosis.  Descending colonic and sigmoid diverticula.  No active diverticulitis.  Urinary bladder is grossly unremarkable.  Uterus and adnexa grossly unremarkable.  Aorta is normal caliber.  Minimal right base atelectasis.  No effusions.  Small hiatal hernia present.  IMPRESSION: Marked wall thickening and surrounding edema/stranding around the distal stomach and proximal duodenum.  This is presumably related to recent gastric/duodenal surgery.  Recommend clinical correlation.  Original Report Authenticated By: Cyndie Chime, M.D.    Review of Systems  Constitutional: Negative for fever and chills.  HENT: Negative.   Eyes: Negative.   Respiratory:  Negative.   Cardiovascular: Negative.   Gastrointestinal: Positive for nausea and abdominal pain (Bilateral upper quadrant). Negative for heartburn, constipation, blood in stool and melena.  Genitourinary: Negative.   Musculoskeletal: Negative.   Skin: Negative.   Neurological: Positive for weakness.  Endo/Heme/Allergies: Negative.   Psychiatric/Behavioral: Negative.    Blood pressure 131/82, pulse 85, temperature 99.5 F (37.5 C), temperature source Axillary, resp. rate 18, height 5\' 6"  (1.676 m), weight 87.5 kg (192 lb 14.4 oz), SpO2 92.00%. Physical Exam  Constitutional: She appears well-developed and well-nourished. No distress.       Obese   HENT:  Head: Normocephalic and atraumatic.  Eyes: Conjunctivae and EOM are normal. Pupils are equal, round, and reactive to light. No scleral icterus.  Neck: No tracheal deviation present. No thyromegaly present.  Cardiovascular: Normal rate, regular rhythm and normal heart sounds.   Respiratory: Effort normal and breath sounds normal. No respiratory distress. She has no wheezes.  GI: Soft. Bowel sounds are normal. She exhibits no distension and no mass. There is tenderness (mild L>R upper quadrant tenderness.  No peritoneal signs.). There is no rebound and no guarding.         Incision well approximated inferior aspect.  Upper 1 cm open as before.  Scant discharge.  No purulence or odor.  No errythema, no crepitance.  Lymphadenopathy:    She has no cervical adenopathy.  Neurological: She is alert.       Slightly confused.  Pleasant.    Skin: Skin is warm and dry. She is not diaphoretic.    Assessment/Plan: Abd pain, Nausea, wound infection, hx GI bleed, dementia.  At this point Pt has not actively demonstrated any signs of new or recurrent UGI bleeding.  CT was reviewed and based on this and pt's exam I have a low suspicion of intra-abdominal hemorrhage.  Obviously the drop in Hg is concerning, although this still may represent a more  chronic anemia in the setting of dilution with resuscitation.  Currently pt remains stable.  I will continue to follow her course during this admission but as previously discussed, should a UGI source become the evident source, she will need transferred to a tertiary center for consideration of definitive surgical intervention.  Mannie Wineland C 02/23/2011, 12:29 PM

## 2011-02-23 NOTE — Progress Notes (Addendum)
Subjective: "I need to talk to somebody because I'm depressed". Feels weak. Heart burn discomfort resolved. Complaining of left upper quadrant pain. Reports of blood-tinged dark stool yesterday, but I cannot confirm this with nursing. She feels nauseated.   Objective: Vital signs in last 24 hours: Filed Vitals:   02/22/11 2100 02/23/11 0212 02/23/11 0600 02/23/11 0915  BP: 148/93 137/80 131/82   Pulse: 107 92 83 85  Temp: 99.4 F (37.4 C) 98 F (36.7 C) 99.5 F (37.5 C)   TempSrc: Oral Oral Axillary   Resp: 20 20 18    Height:      Weight:      SpO2: 96% 96% 96% 92%   Weight change:   Intake/Output Summary (Last 24 hours) at 02/23/11 1208 Last data filed at 02/23/11 0830  Gross per 24 hour  Intake    490 ml  Output    375 ml  Net    115 ml   Gen.: Slightly more comfortable than yesterday. Lying supine. Lungs clear to auscultation bilaterally without wheezes rhonchi or rales Cardiovascular regular rate rhythm without murmurs gallops rubs Abdomen: Incision actually looks better today with minimal drainage. Minimal erythema. Soft, nondistended, left upper quadrant is tender Extremities: No clubbing cyanosis or edema  Psychiatric: Appears depressed Lab Results: Basic Metabolic Panel:  Lab 02/23/11 1610 02/22/11 0457 02/19/11 0502 02/17/11 0606  NA 129* 127* -- --  K 3.1* 3.2* -- --  CL 96 95* -- --  CO2 25 22 -- --  GLUCOSE 149* 172* -- --  BUN 12 13 -- --  CREATININE 0.61 0.61 -- --  CALCIUM 8.3* 7.9* -- --  MG -- -- 1.7 2.0  PHOS -- -- -- --   Liver Function Tests:  Lab 02/22/11 0457 02/21/11 1622  AST 11 11  ALT 9 9  ALKPHOS 98 106  BILITOT 0.3 0.2*  PROT 5.8* 5.6*  ALBUMIN 2.4* 2.4*    Lab 02/21/11 1622 02/19/11 0502  LIPASE 17 18  AMYLASE -- --   CBC:  Lab 02/23/11 0753 02/22/11 0457 02/21/11 1622 02/21/11 0540  WBC 8.9 14.1* -- --  NEUTROABS -- -- 13.6* 10.8*  HGB 8.5* 9.8* -- --  HCT 25.4* 28.6* -- --  MCV 87.3 86.4 -- --  PLT 381 353 -- --     Cardiac Enzymes:  Lab 02/22/11 0019 02/17/11 1650 02/17/11 0840  CKTOTAL 23 31 31   CKMB 2.8 1.9 1.9  CKMBINDEX -- -- --  TROPONINI <0.30 <0.30 <0.30   BNP: No results found for this basename: POCBNP:3 in the last 168 hours D-Dimer: No results found for this basename: DDIMER:2 in the last 168 hours CBG:  Lab 02/23/11 1110 02/23/11 0818 02/23/11 0717 02/22/11 2116 02/22/11 1628 02/22/11 1153  GLUCAP 134* 147* 141* 156* 159* 168*   Hemoglobin A1C:  Lab 02/22/11 0018  HGBA1C 5.6   Fasting Lipid Panel: No results found for this basename: CHOL,HDL,LDLCALC,TRIG,CHOLHDL,LDLDIRECT in the last 960 hours Thyroid Function Tests: No results found for this basename: TSH,T4TOTAL,FREET4,T3FREE,THYROIDAB in the last 168 hours Anemia Panel: No results found for this basename: VITAMINB12,FOLATE,FERRITIN,TIBC,IRON,RETICCTPCT in the last 168 hours   Scheduled Meds:    . sodium chloride   Intravenous Once  . insulin aspart  0-5 Units Subcutaneous QHS  . insulin aspart  0-9 Units Subcutaneous TID WC  . pantoprazole  40 mg Oral BID AC  . potassium chloride  10 mEq Intravenous Q1 Hr x 4  . sodium chloride      . DISCONTD:  folic acid  1 mg Oral Daily   Continuous Infusions:  PRN Meds:.acetaminophen, antiseptic oral rinse, bisacodyl, HYDROmorphone, ondansetron (ZOFRAN) IV, ondansetron, oxyCODONE, polyethylene glycol, sodium phosphate, traZODone, DISCONTD: acetaminophen, DISCONTD: HYDROmorphone, DISCONTD: oxyCODONE Assessment/Plan: Principal Problem:  Worsening anemia: Concerning for recurrent GI bleed, however her heart rate and blood pressure are stable. She has had a still today. Her reports of bloody stool are unconfirmed. I will check a stat hemoglobin and type and screen blood. I will make her n.p.o. for now. Continue proton pump inhibitor. She is not on any anticoagulation or antiplatelet products. I have discussed the case with Dr. Leticia Penna and Dr. Lovell Sheehan. Dr. Leticia Penna will consult. The  plan previously was that if she rebleeds she would need transfer to [facility] for definitive management. I will defer to Dr. Leticia Penna.   STATUS post recent exploratory laparotomy and FNA of the pancreas for bleeding: Her incision today with much less erythema and drainage. She just had inflammatory changes on biopsies, but she still may have an undiagnosed neoplasm. She has had a prolonged hospitalization recently, and her prognosis appears guarded. She has no close family members. I have asked the chaplain to get involved to assist with psychosocial issues, and also with goals of care.  Status post coiling of the gastroduodenal artery:   DIABETES MELLITUS, TYPE II   HYPERLIPIDEMIA   Obesity, unspecified   HYPERTENSION   UNSTEADY GAIT   Other urinary incontinence  Hypokalemia: This will be repleted and we'll check a magnesium level.   Hyponatremia  Patient is agreeable to placement. She does not appear appropriate for assisted living at this time. Physical therapy will need to be involved again and I maintain that she needs to go to skilled nursing facility. She has failed a trial at discharge home. In reviewing physical therapy notes, it was felt that she was assessed to require skilled nursing facility by physical therapy.  Changed to inpatient.   LOS: 2 days   [name] 02/23/2011, 12:08 PM

## 2011-02-24 LAB — BASIC METABOLIC PANEL
BUN: 7 mg/dL (ref 6–23)
CO2: 25 mEq/L (ref 19–32)
Calcium: 8.5 mg/dL (ref 8.4–10.5)
GFR calc non Af Amer: >60 mL/min (ref >60)
Glucose, Bld: 143 mg/dL — ABNORMAL HIGH (ref 70–99)

## 2011-02-24 LAB — CBC
HCT: 25.7 % — ABNORMAL LOW (ref 36.0–46.0)
Hemoglobin: 8.7 g/dL — ABNORMAL LOW (ref 12.0–15.0)
MCH: 29.6 pg (ref 26.0–34.0)
MCHC: 33.9 g/dL (ref 30.0–36.0)

## 2011-02-24 LAB — GLUCOSE, CAPILLARY: Glucose-Capillary: 156 mg/dL — ABNORMAL HIGH (ref 70–99)

## 2011-02-24 MED ORDER — PANTOPRAZOLE SODIUM 40 MG PO TBEC
40.0000 mg | DELAYED_RELEASE_TABLET | Freq: Two times a day (BID) | ORAL | Status: DC
  Administered 2011-02-24 – 2011-02-27 (×6): 40 mg via ORAL
  Filled 2011-02-24 (×6): qty 1

## 2011-02-24 NOTE — Progress Notes (Signed)
Attempted to see pt again for eval.   She states that she is HUNGRY.  She is NPO for scheduled tests.  She states that she won't work with me as "my stomach hurts because I am hungry".  I was unable to persuade her otherwise.  Will try again tomorrow.

## 2011-02-24 NOTE — Progress Notes (Signed)
  Subjective: Patient states improved pain symptoms. Still having some left upper quadrant discomfort but improved since yesterday. No nausea or vomiting. No change in bowel movements. No melena no hematochezia. She denies any fevers or chills.  Objective: Vital signs in last 24 hours: Temp:  [98.2 F (36.8 C)-99.5 F (37.5 C)] 98.2 F (36.8 C) (08/28 0511) Pulse Rate:  [91-119] 91  (08/28 0511) Resp:  [16-20] 20  (08/28 0511) BP: (136-161)/(76-87) 161/87 mmHg (08/28 0511) SpO2:  [91 %-95 %] 91 % (08/28 0511) Last BM Date: 02/20/11  Intake/Output from previous day: 08/27 0701 - 08/28 0700 In: 120 [P.O.:120] Out: -  Intake/Output this shift: I/O this shift: In: -  Out: 200 [Urine:200]  General appearance: alert and no distress GI: Positive bowel sounds, soft, mild bilateral upper quadrant tenderness but no evidence of peritoneal signs. Incision is intact with scant drainage from the upper portion of the incision. No evidence of any appearance. No erythema.  Lab Results:  @LABLAST2 (wbc:2,hgb:2,hct:2,plt:2) BMET  Basename 02/24/11 0531 02/23/11 0753  NA 131* 129*  K 3.4* 3.1*  CL 97 96  CO2 25 25  GLUCOSE 143* 149*  BUN 7 12  CREATININE 0.70 0.61  CALCIUM 8.5 8.3*   PT/INR No results found for this basename: LABPROT:2,INR:2 in the last 72 hours ABG No results found for this basename: PHART:2,PCO2:2,PO2:2,HCO3:2 in the last 72 hours  Studies/Results: No results found.  Anti-infectives: Anti-infectives    None      Assessment/Plan: s/p  Nausea vomiting and failure to thrive. At this point there is no evidence of any new or recurrent bleeding. Hemoglobin has remained stable and again I suspect that this is delusional and chronic in nature. At this point we'll continue to advance diet as tolerated. Her Miquel Dunn the wound infection continue local care. No indications for systemic antibiotics at this point. It is okay from my standpoint per patient the discharge to  extended care facility for continued management with continued outpatient followup.  LOS: 3 days    Arjun Hard C 02/24/2011

## 2011-02-24 NOTE — Progress Notes (Addendum)
Subjective: Pain improved. Hungry. Requesting something to eat. No longer nauseated. Still wants placement. Remembers the details of her last hospitalization. When asked about CODE STATUS, does not want resuscitation. She reports that she would want her stepdaughter to be her healthcare power of attorney. She feels weak.   Objective: Vital signs in last 24 hours: Filed Vitals:   02/23/11 1902 02/23/11 2132 02/24/11 0219 02/24/11 0511  BP: 136/80 150/79 140/76 161/87  Pulse: 98 119 105 91  Temp: 99 F (37.2 C) 99.5 F (37.5 C) 98.9 F (37.2 C) 98.2 F (36.8 C)  TempSrc: Oral Oral    Resp: 20 20 16 20   Height:      Weight:      SpO2: 95% 95% 95% 91%   Weight change:   Intake/Output Summary (Last 24 hours) at 02/24/11 1451 Last data filed at 02/24/11 0900  Gross per 24 hour  Intake      0 ml  Output    200 ml  Net   -200 ml   Gen.: Comfortable Lungs clear to auscultation bilaterally without wheezes rhonchi or rales Cardiovascular regular rate rhythm without murmurs gallops rubs Abdomen: Incision actually looks better today with no drainage. Minimal erythema. Soft, nondistended, less tender . Extremities: No clubbing cyanosis or edema  Psychiatric: Affect brighter  Lab Results: Basic Metabolic Panel:  Lab 02/24/11 4098 02/23/11 1244 02/23/11 0753 02/19/11 0502  NA 131* -- 129* --  K 3.4* -- 3.1* --  CL 97 -- 96 --  CO2 25 -- 25 --  GLUCOSE 143* -- 149* --  BUN 7 -- 12 --  CREATININE 0.70 -- 0.61 --  CALCIUM 8.5 -- 8.3* --  MG -- 1.6 -- 1.7  PHOS -- -- -- --   Liver Function Tests:  Lab 02/22/11 0457 02/21/11 1622  AST 11 11  ALT 9 9  ALKPHOS 98 106  BILITOT 0.3 0.2*  PROT 5.8* 5.6*  ALBUMIN 2.4* 2.4*    Lab 02/21/11 1622 02/19/11 0502  LIPASE 17 18  AMYLASE -- --   CBC:  Lab 02/24/11 0531 02/23/11 1238 02/23/11 0753 02/21/11 1622 02/21/11 0540  WBC 8.0 -- 8.9 -- --  NEUTROABS -- -- -- 13.6* 10.8*  HGB 8.7* 8.9* -- -- --  HCT 25.7* -- 25.4* -- --    MCV 87.4 -- 87.3 -- --  PLT 370 -- 381 -- --   Cardiac Enzymes:  Lab 02/22/11 0019 02/17/11 1650  CKTOTAL 23 31  CKMB 2.8 1.9  CKMBINDEX -- --  TROPONINI <0.30 <0.30   BNP: No results found for this basename: POCBNP:3 in the last 168 hours D-Dimer: No results found for this basename: DDIMER:2 in the last 168 hours CBG:  Lab 02/24/11 1155 02/24/11 0803 02/23/11 2134 02/23/11 1644 02/23/11 1110 02/23/11 0818  GLUCAP 120* 133* 141* 131* 134* 147*   Hemoglobin A1C:  Lab 02/22/11 0018  HGBA1C 5.6   Fasting Lipid Panel: No results found for this basename: CHOL,HDL,LDLCALC,TRIG,CHOLHDL,LDLDIRECT in the last 119 hours Thyroid Function Tests: No results found for this basename: TSH,T4TOTAL,FREET4,T3FREE,THYROIDAB in the last 168 hours Anemia Panel: No results found for this basename: VITAMINB12,FOLATE,FERRITIN,TIBC,IRON,RETICCTPCT in the last 168 hours   Scheduled Meds:    . sodium chloride   Intravenous Once  . insulin aspart  0-5 Units Subcutaneous QHS  . insulin aspart  0-9 Units Subcutaneous TID WC  . pantoprazole  40 mg Oral BID AC  . potassium chloride  10 mEq Intravenous Q1 Hr x 4  .  DISCONTD: pantoprazole  40 mg Oral BID AC  . DISCONTD: pantoprazole (PROTONIX) IV  40 mg Intravenous Q12H   Continuous Infusions:  PRN Meds:.acetaminophen, antiseptic oral rinse, bisacodyl, HYDROmorphone, ondansetron (ZOFRAN) IV, ondansetron, oxyCODONE, polyethylene glycol, sodium phosphate, traZODone Assessment/Plan: Principal Problem:  Anemia: Stabilized. The patient and nurses report no evidence of GI bleeding. I suspect her anemia was related to IV fluids. Patient will be started back on a diet. Appreciate Dr. Illene Regulus input   STATUS post recent exploratory laparotomy and FNA of the pancreas for bleeding: Her incision is without any evidence of cellulitis. Dr. Dian Situ recommends stopping antibiotics.   Status post coiling of the gastroduodenal artery:   DIABETES MELLITUS, TYPE  II   HYPERLIPIDEMIA   Obesity, unspecified   HYPERTENSION   UNSTEADY GAIT   Other urinary incontinence  Hypokalemia: Continue repletion. Magnesium level normal.   Hyponatremia secondary to  Patient is agreeable to placement. She does not appear appropriate for assisted living at this time. Physical therapy will need to be involved again and I maintain that she needs to go to skilled nursing facility. She has failed a trial at discharge home. In reviewing physical therapy notes, it was felt that she was assessed to require skilled nursing facility by physical therapy. She is at high risk for readmission as evidenced this weekend.  Confusion has not been an issue during his hospitalization. She has capacity for informed consent. She requests DO NOT RESUSCITATE.   LOS: 3 days   SULLIVAN,CORINNA L 02/24/2011, 2:51 PM

## 2011-02-25 LAB — GLUCOSE, CAPILLARY
Glucose-Capillary: 137 mg/dL — ABNORMAL HIGH (ref 70–99)
Glucose-Capillary: 134 mg/dL — ABNORMAL HIGH (ref 70–99)
Glucose-Capillary: 125 mg/dL — ABNORMAL HIGH (ref 70–99)
Glucose-Capillary: 160 mg/dL — ABNORMAL HIGH (ref 70–99)

## 2011-02-25 LAB — CBC
Hemoglobin: 9.2 g/dL — ABNORMAL LOW (ref 12.0–15.0)
MCH: 29.8 pg (ref 26.0–34.0)
RBC: 3.09 MIL/uL — ABNORMAL LOW (ref 3.87–5.11)
WBC: 6.8 10*3/uL (ref 4.0–10.5)

## 2011-02-25 LAB — BASIC METABOLIC PANEL
CO2: 25 mEq/L (ref 19–32)
Calcium: 8.6 mg/dL (ref 8.4–10.5)
Chloride: 94 mEq/L — ABNORMAL LOW (ref 96–112)
Glucose, Bld: 157 mg/dL — ABNORMAL HIGH (ref 70–99)
Sodium: 130 mEq/L — ABNORMAL LOW (ref 135–145)

## 2011-02-25 NOTE — Progress Notes (Signed)
Subjective: No complaints, no new events. She feels well.  Objective: Vital signs in last 24 hours: Temp:  [98.4 F (36.9 C)-99.6 F (37.6 C)] 98.4 F (36.9 C) (08/29 0505) Pulse Rate:  [84-108] 84  (08/29 0505) Resp:  [10-18] 18  (08/29 0505) BP: (105-146)/(62-81) 105/62 mmHg (08/29 0505) SpO2:  [96 %-97 %] 97 % (08/29 0505) Weight change:  Last BM Date: 02/20/11  Intake/Output from previous day: 08/28 0701 - 08/29 0700 In: 100 [P.O.:100] Out: 750 [Urine:750] I/O this shift: In: 240 [P.O.:240] Out: -    Physical Exam: General: Alert, awake, oriented x3, in no acute distress. HEENT: No bruits, no goiter. Heart: Regular rate and rhythm, without murmurs, rubs, gallops. Lungs: Clear to auscultation bilaterally. Abdomen: Soft, nontender, nondistended, positive bowel sounds. Extremities: No clubbing cyanosis or edema with positive pedal pulses. Neuro: Grossly intact, nonfocal.    Lab Results:  [facility] 02/25/11 0508 02/24/11 0531  WBC 6.8 8.0  HGB 9.2* 8.7*  HCT 27.1* 25.7*  PLT 329 370    Basename 02/25/11 0508 02/24/11 0531  NA 130* 131*  K 3.0* 3.4*  CL 94* 97  CO2 25 25  GLUCOSE 157* 143*  BUN 8 7  CREATININE 0.73 0.70  CALCIUM 8.6 8.5     Studies/Results: No results found.  Medications: Scheduled Meds:   . sodium chloride   Intravenous Once  . insulin aspart  0-5 Units Subcutaneous QHS  . insulin aspart  0-9 Units Subcutaneous TID WC  . pantoprazole  40 mg Oral BID AC  . DISCONTD: pantoprazole (PROTONIX) IV  40 mg Intravenous Q12H   Continuous Infusions:  PRN Meds:.acetaminophen, antiseptic oral rinse, bisacodyl, HYDROmorphone, ondansetron (ZOFRAN) IV, ondansetron, oxyCODONE, polyethylene glycol, sodium phosphate, traZODone  Assessment/Plan:  1.  Confusion: Appropriate and at baseline. Daughter is here today and attests to this. 2. Pancreatic mass: Status post exploratory laparoscopy on August 8. Pathology with benign reactive changes. Was  eroding into the stomach all the way from the antrum to the duodenal bulb. 3. Disposition: Awaiting placement. Is at high risk for readmission, given inability to care for self.   LOS: 4 days   [name],[name] 02/25/2011, 10:19 AM

## 2011-02-25 NOTE — Progress Notes (Signed)
Physical Therapy Evaluation Patient Name: [name] Date: 02/25/2011 Problem List:  Patient Active Problem List  Diagnoses  . DIABETES MELLITUS, TYPE II  . HYPERLIPIDEMIA  . Obesity, unspecified  . DEPRESSION  . HYPERTENSION  . SINUSITIS, ACUTE  . ALLERGIC RHINITIS, SEASONAL  . OTHER CHRONIC BRONCHITIS  . DIVERTICULOSIS OF COLON  . ACUTE CYSTITIS  . INSOMNIA  . FATIGUE  . UNSTEADY GAIT  . HEADACHE  . Other urinary incontinence  . CLOSED FRACTURE OF OLECRANON PROCESS OF ULNA  . HEMATOMA  . HEADACHES, HX OF  . Anorexia  . Anemia  . Syncope and collapse  . Hepatic steatosis  . Hyponatremia  . Gastric mass  . H pylori ulcer  . Ulcer, colon  . Acute renal failure  . S/P exploratory laparotomy  . Pericardial effusion  . Fever, postprocedural  . Confusion  . Leucocytosis   Past Medical History:  Past Medical History  Diagnosis Date  . Hypertension   . Reflux   . Right elbow pain     OTIF  . Dementia   . Depression   . Osteoarthritis   . Pancreatitis 11/2007    [facility]  . Angiomyolipoma of kidney     right  . Ulcerative esophagitis 12/05/2007    hx elevated gastrin, severe on EGD by Dr Jena Gauss , h pylori negative  . Hiatal hernia   . S/P colonoscopy 2009    pt reports normal by Dr Lovell Sheehan  . Diabetes mellitus   . Anemia    Past Surgical History:  Past Surgical History  Procedure Date  . Orif right hip 1999    [facility]  . Umbilical hernia repair 97 months old    [place]  . Esophagogastroduodenoscopy 01/28/2011    Procedure: ESOPHAGOGASTRODUODENOSCOPY (EGD);  Surgeon: Arlyce Harman, MD;  [facility];  Service: Endoscopy;  Laterality: N/A;  . Colonoscopy 01/28/2011    Procedure: COLONOSCOPY;  Surgeon: Arlyce Harman, MD;  Location: [facility];  Service: Endoscopy;  Laterality: N/A;  . Laparotomy 02/04/2011    Procedure: EXPLORATORY LAPAROTOMY;  Surgeon: Fabio Bering;  Location: [facility];  Service: General;  Laterality: N/A;     Precautions/Restrictions  Precautions Precautions: Fall Required Braces or Orthoses: No Restrictions Weight Bearing Restrictions: No Prior Functioning  Home Living Type of Home: Apartment (error had been made during last assessment) Lives With: Alone Receives Help From: Personal care attendant Home Layout: One level Home Access: Stairs to enter Entrance Stairs-Rails: None Entrance Stairs-Number of Steps: 1 Bathroom Shower/Tub: Tub/shower unit (pt takes a sponge bath) Bathroom Accessibility: Yes How Accessible: Accessible via walker Home Adaptive Equipment: Walker - rolling;Straight cane Prior Function Level of Independence: Needs assistance with homemaking;Independent with transfers;Independent with gait;Needs assistance with ADLs Bath: Moderate Dressing: Minimal Meal Prep: Maximal Light Housekeeping: Maximal Driving: No Vocation: Retired Producer, television/film/video: Awake/alert Overall Cognitive Status: Appears within functional limits for tasks assessed Orientation Level: Oriented X4 Sensation/Coordination Sensation Light Touch: Appears Intact Proprioception: Appears Intact Extremity Assessment RUE Assessment RUE Assessment: Within Functional Limits LUE Assessment LUE Assessment: Within Functional Limits RLE Assessment RLE Assessment: Within Functional Limits LLE Assessment LLE Assessment: Within Functional Limits Mobility (including Balance) Bed Mobility Bed Mobility: Yes Left Sidelying to Sit: 7: Independent Supine to Sit: 7: Independent Sitting - Scoot to Edge of Bed: 7: Independent Transfers Transfers: Yes Sit to Stand: 6: Modified independent (Device/Increase time) Stand to Sit: 6: Modified independent (Device/Increase time) Stand Pivot Transfers: 5: Supervision Ambulation/Gait Ambulation/Gait: Yes  Ambulation/Gait Assistance: 4: Min assist Ambulation/Gait Assistance Details (indicate cue type and reason): gait unstable with cane(which  is what pt normally uses at home)-gait pattern is labored and needs walker to stabilize due to lateral instability Ambulation Distance (Feet): 60 Feet Assistive device: Rolling walker;Straight cane Gait Pattern: Decreased stride length;Decreased hip/knee flexion - right;Decreased hip/knee flexion - left;Trunk flexed;Lateral hip instability;Lateral trunk lean to right ( pelvis is elevted, creating an apparent leg length discrepa)  Posture/Postural Control Posture/Postural Control: Postural limitations Postural Limitations: today, note apparent elevation of L pelvis with L LE appearing shorter than R-this causes some lateral gait instability Balance Balance Assessed: Yes Dynamic Standing Balance Dynamic Standing - Balance Support: During functional activity Dynamic Standing - Level of Assistance: 3: Mod assist (which causes risk of fall at home when alone) Exercise  Total Joint Exercises Ankle Circles/Pumps: AROM;Left;10 reps Short Arc Quad: Strengthening;Both;10 reps;Supine Heel Slides: AROM;Both;10 reps;Supine Hip ABduction/ADduction: AROM;Both;10 reps;Supine Straight Leg Raises: AROM;Both;5 reps;Supine General Exercises - Lower Extremity Ankle Circles/Pumps: AROM;Left;10 reps Short Arc Quad: Strengthening;Both;10 reps;Supine Heel Slides: AROM;Both;10 reps;Supine Hip ABduction/ADduction: AROM;Both;10 reps;Supine Straight Leg Raises: AROM;Both;5 reps;Supine Low Level/ICU Exercises Ankle Circles/Pumps: AROM;Left;10 reps Short Arc Quad: Strengthening;Both;10 reps;Supine Hip ABduction/ADduction: AROM;Both;10 reps;Supine Heel Slides: AROM;Both;10 reps;Supine  End of Session PT - End of Session Equipment Utilized During Treatment: Gait belt Activity Tolerance: Patient tolerated treatment well;Patient limited by fatigue Patient left: in chair;with call bell in reach;with family/visitor present Nurse Communication: Mobility status for transfers;Mobility status for  ambulation General Behavior During Session: [facility] for tasks performed Cognition: [facility] for tasks performed PT Assessment/Plan/Recommendation PT Assessment Clinical Impression Statement: pt is very deconditioned due to recent illness--lives alone and is not capable of managing all ADLs independently--living alone requires multiple times to be up, ambulating to get things/put them away/ etc.--currently, since initial illnews started, she has only been up out of bed to walk one time a dauy at best--she has become weakened and deconditioned from this lack of activity . PT Recommendation/Assessment: Patient will need skilled PT in the acute care venue PT Problem List: Decreased strength;Decreased activity tolerance;Decreased mobility Barriers to Discharge: Decreased caregiver support PT Therapy Diagnosis : Difficulty walking;Abnormality of gait;Generalized weakness PT Plan PT Frequency: Min 3X/week PT Treatment/Interventions: Gait training;Functional mobility training;Therapeutic exercise;Patient/family education PT Recommendation Follow Up Recommendations: Skilled nursing facility Equipment Recommended: Defer to next venue PT Goals  Acute Rehab PT Goals PT Goal Formulation: With patient Time For Goal Achievement: 7 days Pt will Ambulate: 51 - 150 feet;with rolling walker;with supervision Additional Goals Additional Goal #1: able to do 20 repetitions of all exercise [name] 02/25/2011, 11:55 AM

## 2011-02-26 ENCOUNTER — Encounter: Payer: Self-pay | Admitting: Family Medicine

## 2011-02-26 LAB — TYPE AND SCREEN

## 2011-02-26 LAB — BASIC METABOLIC PANEL
Chloride: 95 mEq/L — ABNORMAL LOW (ref 96–112)
Creatinine, Ser: 0.75 mg/dL (ref 0.50–1.10)
GFR calc Af Amer: >60 mL/min (ref >60)
Potassium: 3.8 mEq/L (ref 3.5–5.1)
Sodium: 129 mEq/L — ABNORMAL LOW (ref 135–145)

## 2011-02-26 LAB — CBC
HCT: 25.9 % — ABNORMAL LOW (ref 36.0–46.0)
MCH: 29.4 pg (ref 26.0–34.0)
MCHC: 33.6 g/dL (ref 30.0–36.0)
MCV: 87.5 fL (ref 78.0–100.0)
Platelets: 257 10*3/uL (ref 150–400)
RDW: 14.4 % (ref 11.5–15.5)
WBC: 5.3 10*3/uL (ref 4.0–10.5)

## 2011-02-26 LAB — GLUCOSE, CAPILLARY
Glucose-Capillary: 132 mg/dL — ABNORMAL HIGH (ref 70–99)
Glucose-Capillary: 126 mg/dL — ABNORMAL HIGH (ref 70–99)
Glucose-Capillary: 169 mg/dL — ABNORMAL HIGH (ref 70–99)

## 2011-02-26 MED ORDER — POTASSIUM CHLORIDE 20 MEQ/15ML (10%) PO LIQD
40.0000 meq | Freq: Once | ORAL | Status: AC
  Administered 2011-02-26: 40 meq via ORAL
  Filled 2011-02-26: qty 30

## 2011-02-26 NOTE — Progress Notes (Signed)
  Subjective: No complaints, no new events. She feels well.  Objective: Vital signs in last 24 hours: Temp:  [98.4 F (36.9 C)-98.7 F (37.1 C)] 98.4 F (36.9 C) (08/30 0730) Pulse Rate:  [81-100] 81  (08/30 0633) Resp:  [20] 20  (08/30 0633) BP: (113-120)/(67-77) 113/67 mmHg (08/30 0633) SpO2:  [96 %] 96 % (08/30 0633) Weight:  [84.8 kg (186 lb 15.2 oz)] 186 lb 15.2 oz (84.8 kg) (08/30 0519) Weight change:  Last BM Date: 02/24/11  Intake/Output from previous day: 08/29 0701 - 08/30 0700 In: 720 [P.O.:720] Out: 200 [Urine:200] I/O this shift: In: 150 [P.O.:150] Out: 75 [Urine:75]   Physical Exam: General: Alert, awake, oriented x3, in no acute distress. HEENT: No bruits, no goiter. Heart: Regular rate and rhythm, without murmurs, rubs, gallops. Lungs: Clear to auscultation bilaterally. Abdomen: Soft, nontender, nondistended, positive bowel sounds. Extremities: No clubbing cyanosis or edema with positive pedal pulses. Neuro: Grossly intact, nonfocal.    Lab Results:  Basename 02/26/11 0550 02/25/11 0508  WBC 5.3 6.8  HGB 8.7* 9.2*  HCT 25.9* 27.1*  PLT 257 329    Basename 02/26/11 0550 02/25/11 0508  NA 129* 130*  K 3.8 3.0*  CL 95* 94*  CO2 25 25  GLUCOSE 147* 157*  BUN 7 8  CREATININE 0.75 0.73  CALCIUM 8.6 8.6     Studies/Results: No results found.  Medications: Scheduled Meds:    . sodium chloride   Intravenous Once  . insulin aspart  0-5 Units Subcutaneous QHS  . insulin aspart  0-9 Units Subcutaneous TID WC  . pantoprazole  40 mg Oral BID AC  . potassium chloride  40 mEq Oral Once   Continuous Infusions:  PRN Meds:.acetaminophen, antiseptic oral rinse, bisacodyl, HYDROmorphone, ondansetron (ZOFRAN) IV, ondansetron, oxyCODONE, polyethylene glycol, sodium phosphate, traZODone  Assessment/Plan:  1.  Confusion: Appropriate and at baseline. Daughter is here today and attests to this. 2. Pancreatic mass: Status post exploratory laparoscopy  on August 8. Pathology with benign reactive changes. Was eroding into the stomach all the way from the antrum to the duodenal bulb. 3. Anemia of chronic disease: Hemoglobin hanging around the high 8 to low 9 range. Consider transfusion if hemoglobin were to drop below 7. 4. Disposition: Awaiting placement. Is at high risk for readmission, given inability to care for self.   LOS: 5 days   HERNANDEZ ACOSTA,ESTELA 02/26/2011, 12:24 PM

## 2011-02-26 NOTE — Progress Notes (Signed)
Physical Therapy Treatment Patient Name: [name] Date: 02/26/2011 TIME: 045-409 CHARGES: 1 TE 1 GT Problem List:  Patient Active Problem List  Diagnoses  . DIABETES MELLITUS, TYPE II  . HYPERLIPIDEMIA  . Obesity, unspecified  . DEPRESSION  . HYPERTENSION  . SINUSITIS, ACUTE  . ALLERGIC RHINITIS, SEASONAL  . OTHER CHRONIC BRONCHITIS  . DIVERTICULOSIS OF COLON  . ACUTE CYSTITIS  . INSOMNIA  . FATIGUE  . UNSTEADY GAIT  . HEADACHE  . Other urinary incontinence  . CLOSED FRACTURE OF OLECRANON PROCESS OF ULNA  . HEMATOMA  . HEADACHES, HX OF  . Anorexia  . Anemia  . Syncope and collapse  . Hepatic steatosis  . Hyponatremia  . Gastric mass  . H pylori ulcer  . Ulcer, colon  . Acute renal failure  . S/P exploratory laparotomy  . Pericardial effusion  . Fever, postprocedural  . Confusion  . Leucocytosis   Past Medical History:  Past Medical History  Diagnosis Date  . Hypertension   . Reflux   . Right elbow pain     OTIF  . Dementia   . Depression   . Osteoarthritis   . Pancreatitis 11/2007    [facility]  . Angiomyolipoma of kidney     right  . Ulcerative esophagitis 12/05/2007    hx elevated gastrin, severe on EGD by Dr Jena Gauss , h pylori negative  . Hiatal hernia   . S/P colonoscopy 2009    pt reports normal by Dr Lovell Sheehan  . Diabetes mellitus   . Anemia    Past Surgical History:  Past Surgical History  Procedure Date  . Orif right hip 1999    [facility]  . Umbilical hernia repair 32 months old    [place]  . Esophagogastroduodenoscopy 01/28/2011    Procedure: ESOPHAGOGASTRODUODENOSCOPY (EGD);  Surgeon: Arlyce Harman, MD;  [facility];  Service: Endoscopy;  Laterality: N/A;  . Colonoscopy 01/28/2011    Procedure: COLONOSCOPY;  Surgeon: Arlyce Harman, MD;  [facility];  Service: Endoscopy;  Laterality: N/A;  . Laparotomy 02/04/2011    Procedure: EXPLORATORY LAPAROTOMY;  Surgeon: Fabio Bering;  Location: [facility];  Service: General;   Laterality: N/A;   Precautions/Restrictions  Precautions Precautions: Fall Required Braces or Orthoses: No Restrictions Weight Bearing Restrictions: No Mobility (including Balance) Bed Mobility Left Sidelying to Sit: 7: Independent Supine to Sit: 7: Independent Sitting - Scoot to Edge of Bed: 7: Independent Transfers Sit to Stand: 6: Modified independent (Device/Increase time) Stand to Sit: 6: Modified independent (Device/Increase time) Ambulation/Gait Ambulation/Gait: Yes Ambulation/Gait Assistance: 4: Min assist Ambulation/Gait Assistance Details (indicate cue type and reason): RW; multiple vc's needed for pt to slow down;pt has unstable gait which puts her at risk for falls Ambulation Distance (Feet): 65 Feet Assistive device: Rolling walker Stairs: No Wheelchair Mobility Wheelchair Mobility: No    Exercise  Total Joint Exercises Ankle Circles/Pumps: Both;10 reps Heel Slides: Both;10 reps Hip ABduction/ADduction: Both;10 reps General Exercises - Lower Extremity Ankle Circles/Pumps: Both;10 reps Heel Slides: Both;10 reps Hip ABduction/ADduction: Both;10 reps Hip Flexion/Marching: Both (20 reps) Heel Raises: Standing;10 reps;Both Low Level/ICU Exercises Ankle Circles/Pumps: Both;10 reps Hip ABduction/ADduction: Both;10 reps Heel Slides: Both;10 reps Other Exercises Other Exercises: sti <>stand x5 for strengthening/endurance Other Exercises: standing hip abduction BLE x 5 to challenge dynamic stnading balance  End of Session PT - End of Session Equipment Utilized During Treatment: Gait belt Activity Tolerance: Treatment limited secondary to medical complications (Comment) (pt needed  several seated rest breaks needed due to dizziness) Patient left: in bed;with call bell in reach;with bed alarm set;with family/visitor present General Behavior During Session: [facility] for tasks performed Cognition: [facility] for tasks performed PT Assessment/Plan  PT -  Assessment/Plan Comments on Treatment Session: Pt tolerated gait and bed exercises well;standing exercises pt needed several seated rest breaks due to dizziness and request therapy to end due to this and fatigue;pt tends to be impulsive which puts her at risk for falls  PT Goals  Acute Rehab PT Goals PT Goal: Ambulate - Progress: Progressing toward goal Additional Goals PT Goal: Additional Goal #1 - Progress: Progressing toward goal  [name], [name] 02/26/2011, 11:27 AM

## 2011-02-27 ENCOUNTER — Ambulatory Visit: Payer: Medicare HMO | Admitting: Family Medicine

## 2011-02-27 ENCOUNTER — Encounter: Payer: Self-pay | Admitting: Family Medicine

## 2011-02-27 ENCOUNTER — Inpatient Hospital Stay
Admission: RE | Admit: 2011-02-27 | Discharge: 2011-03-30 | Disposition: A | Discharge status: Home / Self Care | Payer: Medicare HMO | Source: Ambulatory Visit | Attending: Internal Medicine | Admitting: Internal Medicine

## 2011-02-27 LAB — CBC
HCT: 27.5 % — ABNORMAL LOW (ref 36.0–46.0)
MCHC: 33.1 g/dL (ref 30.0–36.0)
RDW: 14.4 % (ref 11.5–15.5)

## 2011-02-27 LAB — BASIC METABOLIC PANEL
BUN: 9 mg/dL (ref 6–23)
Creatinine, Ser: 0.76 mg/dL (ref 0.50–1.10)
GFR calc Af Amer: >60 mL/min (ref >60)
GFR calc non Af Amer: >60 mL/min (ref >60)
Potassium: 3.5 mEq/L (ref 3.5–5.1)

## 2011-02-27 MED ORDER — HYDROCODONE-ACETAMINOPHEN 5-325 MG PO TABS: 1.0000 | ORAL_TABLET | ORAL | Status: AC | PRN

## 2011-02-27 MED ORDER — SODIUM CHLORIDE 0.9 % IJ SOLN
INTRAMUSCULAR | Status: AC
  Administered 2011-02-27: 10 mL
  Filled 2011-02-27: qty 10

## 2011-02-27 NOTE — Discharge Summary (Signed)
Physician Discharge Summary  Patient ID: [name] MRN: [id number] DOB/AGE: 1931/10/29 77 y.o.  Admit date: 02/21/2011 Discharge date: 02/27/2011  Primary Care Physician:  [name] Overman, MD, MD   Discharge Diagnoses:    Principal Problem:  *Confusion Active Problems:  DIABETES MELLITUS, TYPE II  HYPERLIPIDEMIA  Obesity, unspecified  HYPERTENSION  UNSTEADY GAIT  Other urinary incontinence  Anemia  Hyponatremia  Gastric mass  Leucocytosis  . DIABETES MELLITUS, TYPE II  . HYPERLIPIDEMIA  . Obesity, unspecified  . DEPRESSION  . HYPERTENSION        Current Discharge Medication List    CONTINUE these medications which have CHANGED   Details  HYDROcodone-acetaminophen (NORCO) 5-325 MG per tablet Take 1 tablet by mouth every 4 (four) hours as needed for pain. Qty: 20 tablet, Refills: 0      CONTINUE these medications which have NOT CHANGED   Details  amLODipine (NORVASC) 5 MG tablet Take 1 tablet (5 mg total) by mouth daily. Take one tablet by mouth once a day  Qty: 30 tablet, Refills: 3   Associated Diagnoses: Unspecified essential hypertension    benazepril (LOTENSIN) 20 MG tablet Take 1 tablet (20 mg total) by mouth daily. Qty: 30 tablet, Refills: 11   Associated Diagnoses: Unspecified essential hypertension    calcium-vitamin D (OSCAL WITH D) 500-200 MG-UNIT per tablet Take 1 tablet by mouth daily.      darifenacin (ENABLEX) 15 MG 24 hr tablet Take 15 mg by mouth daily.     esomeprazole (NEXIUM) 40 MG capsule Take 40 mg by mouth 2 (two) times daily.      glipiZIDE (GLUCOTROL) 10 MG tablet Take 10 mg by mouth daily. THE DOSE HAS BEEN REDUCED TO 1 TABLET ONCE DAILY.  DO NOT TAKE THIS MEDICINE IF YOUR BLOOD SUGAR IS LESS THAN 130.     insulin glargine (LANTUS SOLOSTAR) 100 UNIT/ML injection 15 Units 2 (two) times daily.     metFORMIN (GLUCOPHAGE) 500 MG tablet Take 500 mg by mouth 2 (two) times daily with a meal.      Multiple Vitamin (MULTIVITAMIN)  tablet Take 1 tablet by mouth daily.      polyethylene glycol (MIRALAX / GLYCOLAX) packet ADD TO WATER DAILY FOR CONSTIPATION. Qty: 14 each    potassium chloride SA (K-DUR,KLOR-CON) 20 MEQ tablet Take 1 tablet (20 mEq total) by mouth daily. Qty: 30 tablet, Refills: 1    temazepam (RESTORIL) 30 MG capsule Take 30 mg by mouth at bedtime as needed. Sleep       STOP taking these medications     B-D ULTRAFINE III SHORT PEN 31G X 8 MM MISC      doxycycline (VIBRA-TABS) 100 MG tablet      glucose blood (ONE TOUCH TEST STRIPS) test strip      Misc. Devices (WALKER) MISC      ondansetron (ZOFRAN) 4 MG tablet          Disposition and Follow-up: Patient will be discharged to skilled nursing facility today in stable and improved condition. She will need continued therapy given her deconditioning.  Consults:  general surgery Dr. Leticia Penna   Significant Diagnostic Studies:  X-ray Chest Pa And Lateral   02/22/2011  *RADIOLOGY REPORT*  Clinical Data: Leukocytosis  CHEST - 2 VIEW  Comparison: 02/10/2011  Findings: Persistent low lung volumes with minimal patchy subsegmental atelectasis at the lung bases.  The central line has been removed.  Heart size upper limits normal.  Tortuous thoracic aorta.  No effusion.  Vascular clips in the upper abdomen.  IMPRESSION:  1.  Stable mild bibasilar atelectasis.  No acute disease.  Original Report Authenticated By: Osa Craver, M.D.   Ct Abdomen Pelvis W Contrast  02/21/2011  *RADIOLOGY REPORT*  Clinical Data: Abdominal pain.  Recent upper GI bleed requiring coil embolization of the gastroduodenal artery.  Recent surgery of unknown type.  CT ABDOMEN AND PELVIS WITH CONTRAST  Technique:  Multidetector CT imaging of the abdomen and pelvis was performed following the standard protocol during bolus administration of intravenous contrast.  Contrast: 100 ml Omnipaque 300 IV.  Comparison: 01/27/2011  Findings: There is marked edema, inflammatory stranding  and wall thickening involving the distal stomach and proximal duodenum. Stranding noted within the adjacent omentum/mesentery and overlying anterior abdominal wall.  This presumably represents postoperative changes from recent surgery.  Recommend clinical correlation for recent gastric or duodenal surgery.  Coils and/or staples are seen in the region of the proximal duodenum.  Liver, spleen, pancreas, adrenals are unremarkable.  Small low- density lesions in the kidneys, likely cysts.  No hydronephrosis.  Descending colonic and sigmoid diverticula.  No active diverticulitis.  Urinary bladder is grossly unremarkable.  Uterus and adnexa grossly unremarkable.  Aorta is normal caliber.  Minimal right base atelectasis.  No effusions.  Small hiatal hernia present.  IMPRESSION: Marked wall thickening and surrounding edema/stranding around the distal stomach and proximal duodenum.  This is presumably related to recent gastric/duodenal surgery.  Recommend clinical correlation.  Original Report Authenticated By: Cyndie Chime, M.D.    Brief H and P: For complete details please refer to admission H and P, but in brief [name] is a pleasant 77 year old black woman admitted to our hospital on August 25 following a prolonged hospitalization for a bleeding gastric mass with apparent extension to the pancreas. Patient had made surprisingly good recovery and so did not qualify for skilled nursing facility placement, declined assisted-living facility placement and so was discharged home. Since discharge home the patient returned to the ED 3 times complaining of weakness and unable to stay at home alone and this time she is considering an offer for a skilled nursing facility placement and we were asked to admit her for further evaluation and management.     Hospital Course:   1.  Confusion: Resolved quickly with IV fluids. Per daughter back to baseline. 2.  DIABETES MELLITUS, TYPE II: Well controlled. 3.   HYPERLIPIDEMIA 4.  Obesity, unspecified 5.  HYPERTENSION 6.  UNSTEADY GAIT: Has been evaluated by physical therapy who believe she will need skilled facility placement for continued rehabilitation. 7.  Other urinary incontinence 8.  Anemia 9.  Hyponatremia 10.  Gastric mass: Benign on biopsy. 11.  Leucocytosis   Time spent on Discharge: Greater than 30 minutes.  SignedChaya Jan 02/27/2011, 9:09 AM

## 2011-02-27 NOTE — Progress Notes (Signed)
Pt discharged to [facility] via staff; pt and family aware of discharge and stated understanding; reported to [place], nurse at [facility] center, who stated understanding and denied questions/concerns; pt stable at time of discharge

## 2011-02-27 NOTE — Consult Note (Signed)
CSW presented bed offers as insurance authorization received. Pt chooses [facility] and will notify her step-daughter.  Facility aware and agreeable. Pt d/c today and will transfer with RN.  Karn Cassis

## 2011-03-03 LAB — GLUCOSE, CAPILLARY
Glucose-Capillary: 98 mg/dL (ref 70–99)
Glucose-Capillary: 105 mg/dL — ABNORMAL HIGH (ref 70–99)
Glucose-Capillary: 117 mg/dL — ABNORMAL HIGH (ref 70–99)
Glucose-Capillary: 96 mg/dL (ref 70–99)
Glucose-Capillary: 122 mg/dL — ABNORMAL HIGH (ref 70–99)
Glucose-Capillary: 81 mg/dL (ref 70–99)
Glucose-Capillary: 122 mg/dL — ABNORMAL HIGH (ref 70–99)

## 2011-03-04 LAB — GLUCOSE, CAPILLARY
Glucose-Capillary: 77 mg/dL (ref 70–99)
Glucose-Capillary: 171 mg/dL — ABNORMAL HIGH (ref 70–99)
Glucose-Capillary: 137 mg/dL — ABNORMAL HIGH (ref 70–99)

## 2011-03-05 ENCOUNTER — Telehealth: Payer: Self-pay | Admitting: General Surgery

## 2011-03-05 LAB — GLUCOSE, CAPILLARY
Glucose-Capillary: 77 mg/dL (ref 70–99)
Glucose-Capillary: 121 mg/dL — ABNORMAL HIGH (ref 70–99)

## 2011-03-06 LAB — GLUCOSE, CAPILLARY
Glucose-Capillary: 88 mg/dL (ref 70–99)
Glucose-Capillary: 99 mg/dL (ref 70–99)
Glucose-Capillary: 146 mg/dL — ABNORMAL HIGH (ref 70–99)

## 2011-03-07 LAB — GLUCOSE, CAPILLARY: Glucose-Capillary: 83 mg/dL (ref 70–99)

## 2011-03-08 LAB — GLUCOSE, CAPILLARY: Glucose-Capillary: 110 mg/dL — ABNORMAL HIGH (ref 70–99)

## 2011-03-09 LAB — GLUCOSE, CAPILLARY
Glucose-Capillary: 119 mg/dL — ABNORMAL HIGH (ref 70–99)
Glucose-Capillary: 122 mg/dL — ABNORMAL HIGH (ref 70–99)

## 2011-03-09 NOTE — Progress Notes (Signed)
Encounter addended by: Clarene Critchley on: 03/09/2011 12:23 PM<BR>     Documentation filed: Flowsheet VN

## 2011-03-10 LAB — GLUCOSE, CAPILLARY
Glucose-Capillary: 71 mg/dL (ref 70–99)
Glucose-Capillary: 116 mg/dL — ABNORMAL HIGH (ref 70–99)

## 2011-03-11 LAB — GLUCOSE, CAPILLARY
Glucose-Capillary: 112 mg/dL — ABNORMAL HIGH (ref 70–99)
Glucose-Capillary: 136 mg/dL — ABNORMAL HIGH (ref 70–99)

## 2011-03-12 LAB — GLUCOSE, CAPILLARY
Glucose-Capillary: 97 mg/dL (ref 70–99)
Glucose-Capillary: 150 mg/dL — ABNORMAL HIGH (ref 70–99)
Glucose-Capillary: 136 mg/dL — ABNORMAL HIGH (ref 70–99)

## 2011-03-12 NOTE — ED Provider Notes (Signed)
Medical screening examination/treatment/procedure(s) were performed by non-physician practitioner and as supervising physician I was immediately available for consultation/collaboration.  Nicholes Stairs, MD 03/12/11 1535

## 2011-03-13 LAB — GLUCOSE, CAPILLARY
Glucose-Capillary: 129 mg/dL — ABNORMAL HIGH (ref 70–99)
Glucose-Capillary: 162 mg/dL — ABNORMAL HIGH (ref 70–99)

## 2011-03-14 LAB — GLUCOSE, CAPILLARY: Glucose-Capillary: 126 mg/dL — ABNORMAL HIGH (ref 70–99)

## 2011-03-15 LAB — GLUCOSE, CAPILLARY
Glucose-Capillary: 100 mg/dL — ABNORMAL HIGH (ref 70–99)
Glucose-Capillary: 112 mg/dL — ABNORMAL HIGH (ref 70–99)
Glucose-Capillary: 157 mg/dL — ABNORMAL HIGH (ref 70–99)

## 2011-03-16 LAB — GLUCOSE, CAPILLARY
Glucose-Capillary: 156 mg/dL — ABNORMAL HIGH (ref 70–99)
Glucose-Capillary: 136 mg/dL — ABNORMAL HIGH (ref 70–99)

## 2011-03-17 LAB — GLUCOSE, CAPILLARY
Glucose-Capillary: 96 mg/dL (ref 70–99)
Glucose-Capillary: 133 mg/dL — ABNORMAL HIGH (ref 70–99)

## 2011-03-18 ENCOUNTER — Telehealth: Payer: Self-pay | Admitting: Family Medicine

## 2011-03-18 LAB — GLUCOSE, CAPILLARY: Glucose-Capillary: 169 mg/dL — ABNORMAL HIGH (ref 70–99)

## 2011-03-18 NOTE — Telephone Encounter (Signed)
Noted, she will need ov in about 1 month please

## 2011-03-18 NOTE — Telephone Encounter (Signed)
NOTED

## 2011-03-19 LAB — GLUCOSE, CAPILLARY
Glucose-Capillary: 102 mg/dL — ABNORMAL HIGH (ref 70–99)
Glucose-Capillary: 130 mg/dL — ABNORMAL HIGH (ref 70–99)
Glucose-Capillary: 136 mg/dL — ABNORMAL HIGH (ref 70–99)

## 2011-03-20 LAB — GLUCOSE, CAPILLARY
Glucose-Capillary: 121 mg/dL — ABNORMAL HIGH (ref 70–99)
Glucose-Capillary: 114 mg/dL — ABNORMAL HIGH (ref 70–99)

## 2011-03-21 LAB — GLUCOSE, CAPILLARY
Glucose-Capillary: 121 mg/dL — ABNORMAL HIGH (ref 70–99)
Glucose-Capillary: 103 mg/dL — ABNORMAL HIGH (ref 70–99)

## 2011-03-22 LAB — GLUCOSE, CAPILLARY
Glucose-Capillary: 82 mg/dL (ref 70–99)
Glucose-Capillary: 97 mg/dL (ref 70–99)
Glucose-Capillary: 147 mg/dL — ABNORMAL HIGH (ref 70–99)

## 2011-03-23 LAB — GLUCOSE, CAPILLARY
Glucose-Capillary: 125 mg/dL — ABNORMAL HIGH (ref 70–99)
Glucose-Capillary: 131 mg/dL — ABNORMAL HIGH (ref 70–99)

## 2011-03-24 ENCOUNTER — Encounter: Payer: Self-pay | Admitting: Gastroenterology

## 2011-03-24 LAB — GLUCOSE, CAPILLARY
Glucose-Capillary: 121 mg/dL — ABNORMAL HIGH (ref 70–99)
Glucose-Capillary: 147 mg/dL — ABNORMAL HIGH (ref 70–99)

## 2011-03-25 LAB — DIFFERENTIAL
Basophils Absolute: 0
Basophils Absolute: 0.1
Basophils Absolute: 0.1
Basophils Relative: 0
Basophils Relative: 1
Eosinophils Relative: 0
Eosinophils Relative: 2
Lymphocytes Relative: 22
Lymphocytes Relative: 26
Lymphocytes Relative: 6 — ABNORMAL LOW
Monocytes Relative: 10
Monocytes Relative: 7
Neutro Abs: 3.3
Neutro Abs: 3.5
Neutro Abs: 7.6
Neutrophils Relative %: 87 — ABNORMAL HIGH

## 2011-03-25 LAB — URINALYSIS, ROUTINE W REFLEX MICROSCOPIC
Bilirubin Urine: NEGATIVE
Bilirubin Urine: NEGATIVE
Hgb urine dipstick: NEGATIVE
Nitrite: NEGATIVE
Protein, ur: NEGATIVE
Specific Gravity, Urine: 1.015
Urobilinogen, UA: 0.2
Urobilinogen, UA: 0.2

## 2011-03-25 LAB — CBC
HCT: 32.7 — ABNORMAL LOW
Hemoglobin: 11.1 — ABNORMAL LOW
Hemoglobin: 10.1 — ABNORMAL LOW
MCHC: 34
MCV: 92.5
Platelets: 402 — ABNORMAL HIGH
RBC: 3.6 — ABNORMAL LOW
RBC: 3.28 — ABNORMAL LOW
RBC: 3.69 — ABNORMAL LOW
RDW: 19 — ABNORMAL HIGH
RDW: 19.5 — ABNORMAL HIGH
RDW: 19.8 — ABNORMAL HIGH
WBC: 5.5
WBC: 8.8

## 2011-03-25 LAB — POCT CARDIAC MARKERS
CKMB, poc: 1
CKMB, poc: 1.2
Operator id: 215651
Troponin i, poc: 0.06 — ABNORMAL HIGH

## 2011-03-25 LAB — BASIC METABOLIC PANEL
CO2: 20
Calcium: 9.5
Calcium: 10.2
Calcium: 9.7
Chloride: 100
Creatinine, Ser: 0.93
Creatinine, Ser: 0.95
GFR calc Af Amer: >60
GFR calc Af Amer: >60
GFR calc Af Amer: >60
GFR calc non Af Amer: 59 — ABNORMAL LOW
GFR calc non Af Amer: 55 — ABNORMAL LOW
Sodium: 130 — ABNORMAL LOW
Sodium: 132 — ABNORMAL LOW

## 2011-03-25 LAB — URINE CULTURE: Colony Count: NO GROWTH

## 2011-03-25 LAB — GLUCOSE, CAPILLARY
Glucose-Capillary: 90 mg/dL (ref 70–99)
Glucose-Capillary: 99 mg/dL (ref 70–99)
Glucose-Capillary: 100 mg/dL — ABNORMAL HIGH (ref 70–99)

## 2011-03-25 LAB — COMPREHENSIVE METABOLIC PANEL
Albumin: 3.1 — ABNORMAL LOW
BUN: 12
Calcium: 8.8
Creatinine, Ser: 0.96
GFR calc Af Amer: >60
Total Protein: 6.6

## 2011-03-25 LAB — POTASSIUM: Potassium: 4.9

## 2011-03-26 ENCOUNTER — Ambulatory Visit: Payer: Medicare HMO | Admitting: Family Medicine

## 2011-03-26 LAB — CBC
HCT: 27.4 — ABNORMAL LOW
HCT: 27.6 — ABNORMAL LOW
HCT: 26.6 — ABNORMAL LOW
Hemoglobin: 10.8 — ABNORMAL LOW
Hemoglobin: 9.3 — ABNORMAL LOW
Hemoglobin: 9.5 — ABNORMAL LOW
Hemoglobin: 8.8 — ABNORMAL LOW
MCHC: 33.1
MCHC: 33.9
MCHC: 34.1
MCHC: 34.3
MCHC: 34.3
MCV: 90.5
MCV: 91
MCV: 91.3
MCV: 91.7
MCV: 91.3
Platelets: 453 — ABNORMAL HIGH
Platelets: 380
RBC: 2.99 — ABNORMAL LOW
RBC: 3.05 — ABNORMAL LOW
RBC: 3.05 — ABNORMAL LOW
RBC: 3.46 — ABNORMAL LOW
RDW: 20.4 — ABNORMAL HIGH
WBC: 11.4 — ABNORMAL HIGH
WBC: 4.8
WBC: 8.8
WBC: 4.1

## 2011-03-26 LAB — BASIC METABOLIC PANEL
BUN: 13
BUN: 3 — ABNORMAL LOW
BUN: 4 — ABNORMAL LOW
CO2: 22
CO2: 26
CO2: 27
Calcium: 7.9 — ABNORMAL LOW
Calcium: 8.3 — ABNORMAL LOW
Calcium: 9
Chloride: 101
Chloride: 98
Creatinine, Ser: 1.09
GFR calc Af Amer: >60
GFR calc Af Amer: >60
GFR calc Af Amer: >60
GFR calc non Af Amer: 49 — ABNORMAL LOW
GFR calc non Af Amer: >60
GFR calc non Af Amer: >60
Glucose, Bld: 103 — ABNORMAL HIGH
Potassium: 3.5
Potassium: 4
Potassium: 4
Sodium: 130 — ABNORMAL LOW
Sodium: 132 — ABNORMAL LOW
Sodium: 133 — ABNORMAL LOW

## 2011-03-26 LAB — H. PYLORI ANTIBODY, IGG: H Pylori IgG: 0.6

## 2011-03-26 LAB — COMPREHENSIVE METABOLIC PANEL
AST: 16
AST: 24
Albumin: 2.6 — ABNORMAL LOW
Albumin: 2.2 — ABNORMAL LOW
BUN: 14
BUN: 5 — ABNORMAL LOW
CO2: 18 — ABNORMAL LOW
CO2: 30
Calcium: 8.5
Calcium: 9
Chloride: 102
Creatinine, Ser: 0.64
Creatinine, Ser: 0.99
Creatinine, Ser: 0.68
GFR calc Af Amer: >60
GFR calc Af Amer: >60
GFR calc non Af Amer: 55 — ABNORMAL LOW
GFR calc non Af Amer: >60
GFR calc non Af Amer: >60
Glucose, Bld: 90
Total Bilirubin: 0.4

## 2011-03-26 LAB — URINALYSIS, ROUTINE W REFLEX MICROSCOPIC
Bilirubin Urine: NEGATIVE
Ketones, ur: NEGATIVE
Nitrite: NEGATIVE
Specific Gravity, Urine: 1.015
Urobilinogen, UA: 0.2

## 2011-03-26 LAB — DIFFERENTIAL
Basophils Absolute: 0
Basophils Absolute: 0
Basophils Relative: 0
Basophils Relative: 1
Basophils Relative: 0
Eosinophils Absolute: 0
Eosinophils Absolute: 0
Eosinophils Absolute: 0.1
Eosinophils Absolute: 0.1
Eosinophils Relative: 2
Eosinophils Relative: 2
Eosinophils Relative: 3
Lymphocytes Relative: 11 — ABNORMAL LOW
Lymphocytes Relative: 11 — ABNORMAL LOW
Lymphocytes Relative: 35
Lymphocytes Relative: 26
Lymphs Abs: 1
Lymphs Abs: 1.3
Lymphs Abs: 1.4
Lymphs Abs: 1.6
Monocytes Absolute: 0.3
Monocytes Absolute: 0.4
Monocytes Absolute: 0.5
Monocytes Absolute: 0.5
Monocytes Absolute: 0.5
Monocytes Relative: 4
Monocytes Relative: 4
Monocytes Relative: 9
Monocytes Relative: 9
Neutro Abs: 9.5 — ABNORMAL HIGH
Neutro Abs: 2.2
Neutrophils Relative %: 83 — ABNORMAL HIGH
Neutrophils Relative %: 86 — ABNORMAL HIGH
Neutrophils Relative %: 54

## 2011-03-26 LAB — GLUCOSE, CAPILLARY
Glucose-Capillary: 81 mg/dL (ref 70–99)
Glucose-Capillary: 87 mg/dL (ref 70–99)
Glucose-Capillary: 170 mg/dL — ABNORMAL HIGH (ref 70–99)

## 2011-03-26 LAB — OVA AND PARASITE EXAMINATION

## 2011-03-26 LAB — KOH PREP: KOH Prep: NONE SEEN

## 2011-03-26 LAB — LIPASE, BLOOD: Lipase: 33

## 2011-03-26 LAB — STOOL CULTURE

## 2011-03-26 LAB — FECAL LACTOFERRIN, QUANT

## 2011-03-26 LAB — HEMOGLOBIN AND HEMATOCRIT, BLOOD
HCT: 27.7 — ABNORMAL LOW
Hemoglobin: 10.8 — ABNORMAL LOW
Hemoglobin: 9 — ABNORMAL LOW

## 2011-03-26 LAB — POCT I-STAT, CHEM 8
Calcium, Ion: 1.16
Glucose, Bld: 216 — ABNORMAL HIGH
HCT: 36
Hemoglobin: 12.2
Potassium: 5.9 — ABNORMAL HIGH

## 2011-03-26 LAB — HEMOGLOBIN A1C: Mean Plasma Glucose: 143

## 2011-03-26 LAB — CLOSTRIDIUM DIFFICILE EIA

## 2011-03-26 LAB — GASTRIN: Gastrin: 343 pg/mL — ABNORMAL HIGH (ref 13–115)

## 2011-03-26 LAB — POTASSIUM: Potassium: 5.9 — ABNORMAL HIGH

## 2011-03-28 LAB — GLUCOSE, CAPILLARY
Glucose-Capillary: 110 mg/dL — ABNORMAL HIGH (ref 70–99)
Glucose-Capillary: 137 mg/dL — ABNORMAL HIGH (ref 70–99)

## 2011-03-29 LAB — GLUCOSE, CAPILLARY
Glucose-Capillary: 103 mg/dL — ABNORMAL HIGH (ref 70–99)
Glucose-Capillary: 145 mg/dL — ABNORMAL HIGH (ref 70–99)

## 2011-03-30 LAB — BASIC METABOLIC PANEL
CO2: 23
Chloride: 100
GFR calc Af Amer: >60
Potassium: 3.8
Sodium: 135

## 2011-03-30 LAB — DIFFERENTIAL
Basophils Relative: 1
Eosinophils Relative: 2
Lymphs Abs: 2.2
Monocytes Absolute: 0.4
Monocytes Relative: 8

## 2011-03-30 LAB — URINALYSIS, ROUTINE W REFLEX MICROSCOPIC
Ketones, ur: NEGATIVE
Urobilinogen, UA: 0.2

## 2011-03-30 LAB — GLUCOSE, CAPILLARY: Glucose-Capillary: 97 mg/dL (ref 70–99)

## 2011-03-30 LAB — CBC
HCT: 37.5
Hemoglobin: 12.6
MCHC: 33.5
MCV: 87.2
RBC: 4.29

## 2011-03-30 LAB — URINE MICROSCOPIC-ADD ON

## 2011-04-01 ENCOUNTER — Encounter: Payer: Medicare HMO | Admitting: Family Medicine

## 2011-04-01 LAB — DIFFERENTIAL
Basophils Relative: 0
Eosinophils Absolute: 0.1
Monocytes Relative: 9
Neutro Abs: 2.8
Neutrophils Relative %: 63

## 2011-04-01 LAB — COMPREHENSIVE METABOLIC PANEL
ALT: 59 — ABNORMAL HIGH
Alkaline Phosphatase: 83
BUN: 11
CO2: 22
Calcium: 8.8
GFR calc non Af Amer: >60
Glucose, Bld: 217 — ABNORMAL HIGH
Potassium: 4
Total Protein: 6

## 2011-04-01 LAB — URINALYSIS, ROUTINE W REFLEX MICROSCOPIC
Bilirubin Urine: NEGATIVE
Glucose, UA: NEGATIVE
Hgb urine dipstick: NEGATIVE
Protein, ur: NEGATIVE
Urobilinogen, UA: 0.2

## 2011-04-01 LAB — CBC
HCT: 39.3
Hemoglobin: 12.7
MCHC: 32.4
RBC: 4.73
RDW: 20.3 — ABNORMAL HIGH

## 2011-04-01 LAB — POCT CARDIAC MARKERS: Troponin i, poc: <0.05

## 2011-04-01 LAB — MAGNESIUM: Magnesium: 2

## 2011-04-01 LAB — PROTIME-INR
INR: 1
Prothrombin Time: 13.6

## 2011-04-01 NOTE — Telephone Encounter (Signed)
Appointment 10.8.12 @ 3:00

## 2011-04-02 ENCOUNTER — Encounter: Payer: Self-pay | Admitting: Family Medicine

## 2011-04-03 ENCOUNTER — Encounter: Payer: Self-pay | Admitting: Family Medicine

## 2011-04-06 ENCOUNTER — Encounter: Payer: Self-pay | Admitting: Family Medicine

## 2011-04-06 ENCOUNTER — Ambulatory Visit: Payer: Medicare HMO | Admitting: Family Medicine

## 2011-04-06 DIAGNOSIS — I10 Essential (primary) hypertension: Secondary | ICD-10-CM

## 2011-04-06 DIAGNOSIS — E119 Type 2 diabetes mellitus without complications: Secondary | ICD-10-CM

## 2011-04-06 DIAGNOSIS — R269 Unspecified abnormalities of gait and mobility: Secondary | ICD-10-CM

## 2011-04-06 DIAGNOSIS — D649 Anemia, unspecified: Secondary | ICD-10-CM

## 2011-04-08 ENCOUNTER — Other Ambulatory Visit: Payer: Self-pay | Admitting: Family Medicine

## 2011-04-09 ENCOUNTER — Telehealth: Payer: Self-pay | Admitting: Family Medicine

## 2011-04-09 NOTE — Telephone Encounter (Signed)
Can she have Norco refilled?

## 2011-04-09 NOTE — Telephone Encounter (Signed)
deny

## 2011-04-10 ENCOUNTER — Other Ambulatory Visit: Payer: Self-pay | Admitting: Family Medicine

## 2011-04-10 MED ORDER — ACETAMINOPHEN 500 MG PO TABS: 500.0000 mg | ORAL_TABLET | Freq: Three times a day (TID) | ORAL | Status: DC | PRN

## 2011-04-10 NOTE — Telephone Encounter (Signed)
pls contac pt and home health, let them know med is sent in for pain as requested

## 2011-04-13 ENCOUNTER — Telehealth: Payer: Self-pay | Admitting: Family Medicine

## 2011-04-14 ENCOUNTER — Encounter (HOSPITAL_COMMUNITY): Payer: Self-pay | Admitting: *Deleted

## 2011-04-14 ENCOUNTER — Emergency Department (HOSPITAL_COMMUNITY)
Admission: EM | Admit: 2011-04-14 | Discharge: 2011-04-14 | Disposition: A | Discharge status: Home / Self Care | Payer: Medicare HMO | Attending: Emergency Medicine | Admitting: Emergency Medicine

## 2011-04-14 ENCOUNTER — Emergency Department (HOSPITAL_COMMUNITY): Payer: Medicare HMO

## 2011-04-14 DIAGNOSIS — Z794 Long term (current) use of insulin: Secondary | ICD-10-CM | POA: Insufficient documentation

## 2011-04-14 DIAGNOSIS — B9789 Other viral agents as the cause of diseases classified elsewhere: Secondary | ICD-10-CM | POA: Insufficient documentation

## 2011-04-14 DIAGNOSIS — F3289 Other specified depressive episodes: Secondary | ICD-10-CM | POA: Insufficient documentation

## 2011-04-14 DIAGNOSIS — R059 Cough, unspecified: Secondary | ICD-10-CM | POA: Insufficient documentation

## 2011-04-14 DIAGNOSIS — F329 Major depressive disorder, single episode, unspecified: Secondary | ICD-10-CM | POA: Insufficient documentation

## 2011-04-14 DIAGNOSIS — E119 Type 2 diabetes mellitus without complications: Secondary | ICD-10-CM | POA: Insufficient documentation

## 2011-04-14 DIAGNOSIS — I10 Essential (primary) hypertension: Secondary | ICD-10-CM | POA: Insufficient documentation

## 2011-04-14 DIAGNOSIS — Z79899 Other long term (current) drug therapy: Secondary | ICD-10-CM | POA: Insufficient documentation

## 2011-04-14 DIAGNOSIS — M1990 Unspecified osteoarthritis, unspecified site: Secondary | ICD-10-CM | POA: Insufficient documentation

## 2011-04-14 DIAGNOSIS — F0390 Unspecified dementia without behavioral disturbance: Secondary | ICD-10-CM | POA: Insufficient documentation

## 2011-04-14 DIAGNOSIS — R05 Cough: Secondary | ICD-10-CM | POA: Insufficient documentation

## 2011-04-14 DIAGNOSIS — B349 Viral infection, unspecified: Secondary | ICD-10-CM

## 2011-04-14 LAB — URINALYSIS, ROUTINE W REFLEX MICROSCOPIC
Glucose, UA: NEGATIVE mg/dL
Leukocytes, UA: NEGATIVE
pH: 6.5 (ref 5.0–8.0)

## 2011-04-14 LAB — BASIC METABOLIC PANEL
CO2: 25 mEq/L (ref 19–32)
Chloride: 96 mEq/L (ref 96–112)
Creatinine, Ser: 0.71 mg/dL (ref 0.50–1.10)
Sodium: 130 mEq/L — ABNORMAL LOW (ref 135–145)

## 2011-04-14 LAB — DIFFERENTIAL
Basophils Absolute: 0 10*3/uL (ref 0.0–0.1)
Lymphocytes Relative: 41 % (ref 12–46)
Monocytes Absolute: 0.3 10*3/uL (ref 0.1–1.0)
Neutro Abs: 1.7 10*3/uL (ref 1.7–7.7)

## 2011-04-14 LAB — CBC
HCT: 31.7 % — ABNORMAL LOW (ref 36.0–46.0)
RBC: 3.91 MIL/uL (ref 3.87–5.11)
RDW: 17.2 % — ABNORMAL HIGH (ref 11.5–15.5)
WBC: 3.7 10*3/uL — ABNORMAL LOW (ref 4.0–10.5)

## 2011-04-14 NOTE — ED Notes (Signed)
No Change in status---awaiting chest x-ray

## 2011-04-14 NOTE — Telephone Encounter (Signed)
Patients nurse was called and aware

## 2011-04-14 NOTE — ED Provider Notes (Signed)
History    Scribed for Laray Anger, DO, the patient was seen in room [facility]04/[facility]. This chart was scribed by Katha Cabal. This patient's care was started at 7:31 AM.    CSN: [id number] Arrival date & time: 04/14/2011  6:07 AM  Chief Complaint  Patient presents with  . Influenza    HPI Pt was seen at 7:32 AM [name] is a 77 y.o. female who presents to the Emergency Department complaining of gradual onset and persistence of intermittent cough, runny/stuffy nose, generalized body aches/fatigue, sinus and ears congestion, and decreased appetite for the past 2 days.  Denies sore throat and recent sick contacts.  Denies rash, no fevers, no abd pain, no N/V/D, no back pain, no CP/SOB, no cough.   [name] Overman, MD, MD    Past Medical History  Diagnosis Date  . Hypertension   . Reflux   . Right elbow pain     OTIF  . Dementia   . Depression   . Osteoarthritis   . Pancreatitis 11/2007    [facility]  . Angiomyolipoma of kidney     right  . Ulcerative esophagitis 12/05/2007    hx elevated gastrin, severe on EGD by Dr Jena Gauss , h pylori negative  . Hiatal hernia   . S/P colonoscopy 2009    pt reports normal by Dr Lovell Sheehan  . Diabetes mellitus   . Anemia     Past Surgical History  Procedure Date  . Orif right hip 1999    [facility]  . Umbilical hernia repair 11 months old    [place]  . Esophagogastroduodenoscopy 01/28/2011    Procedure: ESOPHAGOGASTRODUODENOSCOPY (EGD);  Surgeon: Arlyce Harman, MD;  [facility];  Service: Endoscopy;  Laterality: N/A;  . Colonoscopy 01/28/2011    Procedure: COLONOSCOPY;  Surgeon: Arlyce Harman, MD;  [facility];  Service: Endoscopy;  Laterality: N/A;  . Laparotomy 02/04/2011    Procedure: EXPLORATORY LAPAROTOMY;  Surgeon: Fabio Bering;  Location: [facility];  Service: General;  Laterality: N/A;    Family History  Problem Relation Age of Onset  . Cancer Mother     pelvic     History  Substance Use Topics  . Smoking  status: Never Smoker   . Smokeless tobacco: Current User    Types: Chew  . Alcohol Use: No     Hx of Alcohol dependecy      Review of Systems ROS: Statement: All systems negative except as marked or noted in the HPI; Constitutional: +generalized weakness/fatigue.  Negative for fever and chills. ; ; Eyes: Negative for eye pain, redness and discharge. ; ; ENMT: +runny/stuffy nose, sinus congestion.  Negative for ear pain, hoarseness, and sore throat. ; ; Cardiovascular: Negative for chest pain, palpitations, diaphoresis, dyspnea and peripheral edema. ; ; Respiratory: +cough.  Negative for wheezing and stridor. ; ; Gastrointestinal: Negative for nausea, vomiting, diarrhea and abdominal pain, blood in stool, hematemesis, jaundice and rectal bleeding. . ; ; Genitourinary: Negative for dysuria, flank pain and hematuria. ; ; Musculoskeletal: Negative for back pain and neck pain. Negative for swelling and trauma.; ; Skin: Negative for pruritus, rash, abrasions, blisters, bruising and skin lesion.; ; Neuro: Negative for headache, lightheadedness and neck stiffness. Negative for weakness, altered level of consciousness , altered mental status, extremity weakness, paresthesias, involuntary movement, seizure and syncope.      Allergies  Other and Penicillins  Home Medications   Current Outpatient Rx  Name Route Sig Dispense Refill  .  AMLODIPINE BESYLATE 5 MG PO TABS Oral Take 1 tablet (5 mg total) by mouth daily. Take one tablet by mouth once a day  30 tablet 3  . BENAZEPRIL HCL 20 MG PO TABS Oral Take 1 tablet (20 mg total) by mouth daily. 30 tablet 11    Dose increase effective 12/08/2010, plsdiscontinue ...  . CALCIUM CARBONATE-VITAMIN D 500-200 MG-UNIT PO TABS Oral Take 1 tablet by mouth daily.      Marland Kitchen DARIFENACIN HYDROBROMIDE 15 MG PO TB24 Oral Take 15 mg by mouth daily.     Marland Kitchen ESOMEPRAZOLE MAGNESIUM 40 MG PO CPDR Oral Take 40 mg by mouth 2 (two) times daily.      Marland Kitchen GLIPIZIDE 10 MG PO TABS Oral  Take 10 mg by mouth daily. THE DOSE HAS BEEN REDUCED TO 1 TABLET ONCE DAILY.  DO NOT TAKE THIS MEDICINE IF YOUR BLOOD SUGAR IS LESS THAN 130.     Marland Kitchen INSULIN GLARGINE 100 UNIT/ML [place] SOLN  15 Units 2 (two) times daily.     Marland Kitchen METFORMIN HCL 500 MG PO TABS Oral Take 500 mg by mouth 2 (two) times daily with a meal.      . ONE-DAILY MULTI VITAMINS PO TABS Oral Take 1 tablet by mouth daily.      Marland Kitchen POLYETHYLENE GLYCOL 3350 PO PACK  ADD TO WATER DAILY FOR CONSTIPATION. 14 each   . POTASSIUM CHLORIDE CRYS CR 20 MEQ PO TBCR Oral Take 1 tablet (20 mEq total) by mouth daily. 30 tablet 1  . TEMAZEPAM 30 MG PO CAPS Oral Take 30 mg by mouth at bedtime as needed. Sleep     . ACETAMINOPHEN 500 MG PO TABS Oral Take 1 tablet (500 mg total) by mouth 3 (three) times daily as needed for pain. 60 tablet 0    BP 140/84  Pulse 109  Temp(Src) 99 F (37.2 C) (Oral)  Resp 20  Wt 172 lb (78.019 kg)  Physical Exam 0735: Physical examination:  Nursing notes reviewed; Vital signs and O2 SAT reviewed;  Constitutional: Well developed, Well nourished, Well hydrated, In no acute distress; Head:  Normocephalic, atraumatic; Eyes: EOMI, PERRL, No scleral icterus; ENMT: TM's clear bilat. +edemetous nasal turbinates bilat with clear rhinorrhea.  Mouth and pharynx normal, Mucous membranes moist; Neck: Supple, Full range of motion, No lymphadenopathy; Cardiovascular: Regular rate and rhythm, No murmur, rub, or gallop; Respiratory: Breath sounds clear & equal bilaterally, No rales, rhonchi, wheezes, or rub, Normal respiratory effort/excursion; Chest: Nontender, Movement normal; Abdomen: Soft, Nontender, Nondistended, Normal bowel sounds; Genitourinary: No CVA tenderness; Extremities: Pulses normal, No tenderness, No edema, No calf edema or asymmetry.; Neuro: AA&Ox3, Major CN grossly intact.  No facial droop.  Speech clear.  Normal coordination, climbs on and off stretcher without difficulty.  Gait steady.  No gross focal motor or sensory  deficits in extremities.; Skin: Color normal, Warm, Dry, no rash, no petechiae.   ED Course  Procedures    MDM  MDM Reviewed: nursing note and vitals Reviewed previous: labs Interpretation: labs and x-ray   Results for orders placed during the hospital encounter of 04/14/11  CBC      Component Value Range   WBC 3.7 (*) 4.0 - 10.5 (K/uL)   RBC 3.91  3.87 - 5.11 (MIL/uL)   Hemoglobin 10.2 (*) 12.0 - 15.0 (g/dL)   HCT 16.1 (*) 09.6 - 46.0 (%)   MCV 81.1  78.0 - 100.0 (fL)   MCH 26.1  26.0 - 34.0 (pg)   MCHC  32.2  30.0 - 36.0 (g/dL)   RDW 08.6 (*) 57.8 - 15.5 (%)   Platelets 385  150 - 400 (K/uL)  DIFFERENTIAL      Component Value Range   Neutrophils Relative 48  43 - 77 (%)   Neutro Abs 1.7  1.7 - 7.7 (K/uL)   Lymphocytes Relative 41  12 - 46 (%)   Lymphs Abs 1.5  0.7 - 4.0 (K/uL)   Monocytes Relative 9  3 - 12 (%)   Monocytes Absolute 0.3  0.1 - 1.0 (K/uL)   Eosinophils Relative 2  0 - 5 (%)   Eosinophils Absolute 0.1  0.0 - 0.7 (K/uL)   Basophils Relative 1  0 - 1 (%)   Basophils Absolute 0.0  0.0 - 0.1 (K/uL)  BASIC METABOLIC PANEL      Component Value Range   Sodium 130 (*) 135 - 145 (mEq/L)   Potassium 4.6  3.5 - 5.1 (mEq/L)   Chloride 96  96 - 112 (mEq/L)   CO2 25  19 - 32 (mEq/L)   Glucose, Bld 172 (*) 70 - 99 (mg/dL)   BUN 6  6 - 23 (mg/dL)   Creatinine, Ser 4.69  0.50 - 1.10 (mg/dL)   Calcium 8.8  8.4 - 62.9 (mg/dL)   GFR calc non Af Amer 81 (*) >90 (mL/min)   GFR calc Af Amer >90  >90 (mL/min)  URINALYSIS, ROUTINE W REFLEX MICROSCOPIC      Component Value Range   Color, Urine YELLOW  YELLOW    Appearance CLEAR  CLEAR    Specific Gravity, Urine 1.010  1.005 - 1.030    pH 6.5  5.0 - 8.0    Glucose, UA NEGATIVE  NEGATIVE (mg/dL)   Hgb urine dipstick NEGATIVE  NEGATIVE    Bilirubin Urine NEGATIVE  NEGATIVE    Ketones, ur NEGATIVE  NEGATIVE (mg/dL)   Protein, ur NEGATIVE  NEGATIVE (mg/dL)   Urobilinogen, UA 0.2  0.0 - 1.0 (mg/dL)   Nitrite NEGATIVE   NEGATIVE    Leukocytes, UA NEGATIVE  NEGATIVE    Dg Chest 2 View  04/14/2011  *RADIOLOGY REPORT*  Clinical Data: Cough, anorexia, joints aching, hypertension  CHEST - 2 VIEW  Comparison: 02/22/2011  Findings: Borderline enlargement of cardiac silhouette. Calcified tortuous aorta. Pulmonary vascularity normal. Lungs clear. No pleural effusion or pneumothorax. Mild scoliosis with minimal end plate spur formation thoracic spine.  IMPRESSION: No acute abnormalities.  Original Report Authenticated By: Lollie Marrow, M.D.   8:58 AM:  Pt wants to go home now.  Appears viral illness/URI at this time.  H/H and Na per pt's baseline, per previous labs review.  Pt has been walking around ED with steady gait, easy resps, NAD.  Dx testing d/w pt.  Questions answered.  Verb understanding, agreeable to d/c home with outpt f/u.    [facility] M  I personally performed the services described in this documentation, which was scribed in my presence. The recorded information has been reviewed and considered.            Laray Anger, DO 04/16/11 Paulo Fruit

## 2011-04-14 NOTE — Telephone Encounter (Signed)
Noted  

## 2011-04-14 NOTE — ED Notes (Signed)
Returned from x-ray.  Stable--no change in condition

## 2011-04-14 NOTE — ED Notes (Signed)
Report rcd from p.m. Shift--Pt. Sitting in chair in exam room--awaiting lab results

## 2011-04-14 NOTE — ED Notes (Signed)
Weak, abd and head pain, aching all over, fever, nonproductive cough

## 2011-04-15 ENCOUNTER — Telehealth: Payer: Self-pay | Admitting: Family Medicine

## 2011-04-15 LAB — URINE CULTURE
Colony Count: 45000
Culture  Setup Time: 201210161936

## 2011-04-15 NOTE — Telephone Encounter (Signed)
Wants something else for pain

## 2011-04-15 NOTE — Telephone Encounter (Signed)
I have already advised and entered tylenol pls f/u on this

## 2011-04-15 NOTE — Telephone Encounter (Signed)
Ok to send in for 1 can ensure daily x 1 month refill 2.Advise pt she needs to be seen in office

## 2011-04-16 ENCOUNTER — Emergency Department (HOSPITAL_COMMUNITY)
Admission: EM | Admit: 2011-04-16 | Discharge: 2011-04-16 | Disposition: A | Discharge status: Home / Self Care | Payer: Medicare HMO | Attending: Emergency Medicine | Admitting: Emergency Medicine

## 2011-04-16 ENCOUNTER — Encounter (HOSPITAL_COMMUNITY): Payer: Self-pay | Admitting: Emergency Medicine

## 2011-04-16 ENCOUNTER — Encounter: Payer: Self-pay | Admitting: Family Medicine

## 2011-04-16 DIAGNOSIS — Z862 Personal history of diseases of the blood and blood-forming organs and certain disorders involving the immune mechanism: Secondary | ICD-10-CM | POA: Insufficient documentation

## 2011-04-16 DIAGNOSIS — I10 Essential (primary) hypertension: Secondary | ICD-10-CM | POA: Insufficient documentation

## 2011-04-16 DIAGNOSIS — M1990 Unspecified osteoarthritis, unspecified site: Secondary | ICD-10-CM | POA: Insufficient documentation

## 2011-04-16 DIAGNOSIS — K219 Gastro-esophageal reflux disease without esophagitis: Secondary | ICD-10-CM | POA: Insufficient documentation

## 2011-04-16 DIAGNOSIS — R630 Anorexia: Secondary | ICD-10-CM | POA: Insufficient documentation

## 2011-04-16 DIAGNOSIS — R5381 Other malaise: Secondary | ICD-10-CM | POA: Insufficient documentation

## 2011-04-16 DIAGNOSIS — G4700 Insomnia, unspecified: Secondary | ICD-10-CM | POA: Insufficient documentation

## 2011-04-16 DIAGNOSIS — E119 Type 2 diabetes mellitus without complications: Secondary | ICD-10-CM | POA: Insufficient documentation

## 2011-04-16 DIAGNOSIS — Z794 Long term (current) use of insulin: Secondary | ICD-10-CM | POA: Insufficient documentation

## 2011-04-16 DIAGNOSIS — R51 Headache: Secondary | ICD-10-CM | POA: Insufficient documentation

## 2011-04-16 MED ORDER — ENSURE PLUS PO LIQD: 1.0000 | Freq: Every day | ORAL | Status: DC

## 2011-04-16 MED ORDER — OXYCODONE-ACETAMINOPHEN 5-325 MG PO TABS
1.0000 | ORAL_TABLET | Freq: Once | ORAL | Status: AC
  Administered 2011-04-16: 1 via ORAL
  Filled 2011-04-16: qty 1

## 2011-04-16 NOTE — Telephone Encounter (Signed)
Sent in to [facility] 

## 2011-04-16 NOTE — ED Notes (Signed)
Pt reports headache for several days, states that she has generalized body aches and has not slept or had anything to eat for several days. Pt reports last BM was yesterday.

## 2011-04-16 NOTE — ED Notes (Signed)
Presents via EMS with c/o headache, not able to sleep and not able to eat x 2 weeks.

## 2011-04-16 NOTE — ED Provider Notes (Signed)
History     CSN: [id number] Arrival date & time: 04/16/2011  1:09 AM   First MD Initiated Contact with Patient 04/16/11 0112      Chief Complaint  Patient presents with  . Headache  . Insomnia  . Anorexia    (Consider location/radiation/quality/duration/timing/severity/associated sxs/prior treatment) Patient is a 77 y.o. female presenting with headaches. The history is provided by the patient.  Headache  This is a recurrent problem. The current episode started 2 days ago. The problem occurs constantly. The problem has not changed since onset.The headache is associated with nothing. The quality of the pain is described as throbbing. The pain is mild. The pain does not radiate. Associated symptoms include anorexia and malaise/fatigue. Pertinent negatives include no fever, no chest pressure, no near-syncope, no syncope, no shortness of breath, no nausea and no vomiting.  denies trauma Denies fever No neck pain No focal weakness, just reports fatigue Denies visual changes, no denies blurred vision, denies difficulty speaking and denies difficulty swallowing Reports HA is similar to prior headaches, and HA was gradual in onset  Past Medical History  Diagnosis Date  . Hypertension   . Reflux   . Right elbow pain     OTIF  . Dementia   . Depression   . Osteoarthritis   . Pancreatitis 11/2007    [facility]  . Angiomyolipoma of kidney     right  . Ulcerative esophagitis 12/05/2007    hx elevated gastrin, severe on EGD by Dr Jena Gauss , h pylori negative  . Hiatal hernia   . S/P colonoscopy 2009    pt reports normal by Dr Lovell Sheehan  . Diabetes mellitus   . Anemia     Past Surgical History  Procedure Date  . Orif right hip 1999    [facility]  . Umbilical hernia repair 8 months old    [place]  . Esophagogastroduodenoscopy 01/28/2011    Procedure: ESOPHAGOGASTRODUODENOSCOPY (EGD);  Surgeon: Arlyce Harman, MD;  [facility];  Service: Endoscopy;  Laterality: N/A;  . Colonoscopy  01/28/2011    Procedure: COLONOSCOPY;  Surgeon: Arlyce Harman, MD;  [facility];  Service: Endoscopy;  Laterality: N/A;  . Laparotomy 02/04/2011    Procedure: EXPLORATORY LAPAROTOMY;  Surgeon: Fabio Bering;  Location: [facility];  Service: General;  Laterality: N/A;    Family History  Problem Relation Age of Onset  . Cancer Mother     pelvic     History  Substance Use Topics  . Smoking status: Never Smoker   . Smokeless tobacco: Current User    Types: Chew  . Alcohol Use: No     Hx of Alcohol dependecy     OB History    Grav Para Term Preterm Abortions TAB SAB Ect Mult Living                  Review of Systems  Constitutional: Positive for malaise/fatigue. Negative for fever.  Respiratory: Negative for shortness of breath.   Cardiovascular: Negative for syncope and near-syncope.  Gastrointestinal: Positive for anorexia. Negative for nausea and vomiting.  Neurological: Positive for headaches.  All other systems reviewed and are negative.    Allergies  Other and Penicillins  Home Medications   Current Outpatient Rx  Name Route Sig Dispense Refill  . ACETAMINOPHEN 500 MG PO TABS Oral Take 1 tablet (500 mg total) by mouth 3 (three) times daily as needed for pain. 60 tablet 0  . AMLODIPINE BESYLATE 5 MG PO  TABS Oral Take 1 tablet (5 mg total) by mouth daily. Take one tablet by mouth once a day  30 tablet 3  . BENAZEPRIL HCL 20 MG PO TABS Oral Take 1 tablet (20 mg total) by mouth daily. 30 tablet 11    Dose increase effective 12/08/2010, plsdiscontinue ...  . CALCIUM CARBONATE-VITAMIN D 500-200 MG-UNIT PO TABS Oral Take 1 tablet by mouth daily.      Marland Kitchen DARIFENACIN HYDROBROMIDE 15 MG PO TB24 Oral Take 15 mg by mouth daily.     Marland Kitchen ESOMEPRAZOLE MAGNESIUM 40 MG PO CPDR Oral Take 40 mg by mouth 2 (two) times daily.      Marland Kitchen GLIPIZIDE 10 MG PO TABS Oral Take 10 mg by mouth daily. THE DOSE HAS BEEN REDUCED TO 1 TABLET ONCE DAILY.  DO NOT TAKE THIS MEDICINE IF YOUR BLOOD  SUGAR IS LESS THAN 130.     Marland Kitchen INSULIN GLARGINE 100 UNIT/ML  SOLN  15 Units 2 (two) times daily.     Marland Kitchen METFORMIN HCL 500 MG PO TABS Oral Take 500 mg by mouth 2 (two) times daily with a meal.      . ONE-DAILY MULTI VITAMINS PO TABS Oral Take 1 tablet by mouth daily.      Marland Kitchen POLYETHYLENE GLYCOL 3350 PO PACK  ADD TO WATER DAILY FOR CONSTIPATION. 14 each   . POTASSIUM CHLORIDE CRYS CR 20 MEQ PO TBCR Oral Take 1 tablet (20 mEq total) by mouth daily. 30 tablet 1  . TEMAZEPAM 30 MG PO CAPS Oral Take 30 mg by mouth at bedtime as needed. Sleep       BP 145/83  Pulse 113  Temp(Src) 98.6 F (37 C) (Oral)  Resp 18  Ht 5' 6.5" (1.689 m)  Wt 182 lb (82.555 kg)  BMI 28.94 kg/m2  SpO2 98%  Physical Exam  CONSTITUTIONAL: Well developed/well nourished HEAD AND FACE: Normocephalic/atraumatic EYES: EOMI/PERRL ENMT: Mucous membranes moist NECK: supple no meningeal signs CV: S1/S2 noted, no murmurs/rubs/gallops noted LUNGS: Lungs are clear to auscultation bilaterally, no apparent distress ABDOMEN: soft, nontender, no rebound or guarding NEUROAwake/alert, facies symmetric, no arm or leg drift is noted Cranial nerves 3/4/5/6/01/04/09/11/12 tested and intact Gait normal EXTREMITIES: pulses normal, full ROM SKIN: warm, color normal PSYCH: no abnormalities of mood noted   ED Course  Procedures (including critical care time)     MDM  Nursing notes reviewed and considered in documentation Previous records reviewed and considered  Pt with normal neuro exam, ambulatory, reports similar to prior HA She did have recent URI that is improving per patient Pt had negative CT head in -12/2010   3:23 AM Pt sitting up, no distress, reports she feels improved I doubt any acute neurologic process at this time  The patient appears reasonably screened and/or stabilized for discharge and I doubt any other medical condition or other [facility] requiring further screening, evaluation, or treatment in the ED at this  time prior to discharge. :      Joya Gaskins, MD 04/16/11 [id number]

## 2011-04-16 NOTE — Telephone Encounter (Signed)
Patient aware.

## 2011-04-18 ENCOUNTER — Encounter (HOSPITAL_COMMUNITY): Payer: Self-pay | Admitting: *Deleted

## 2011-04-18 ENCOUNTER — Emergency Department (HOSPITAL_COMMUNITY)
Admission: EM | Admit: 2011-04-18 | Discharge: 2011-04-18 | Disposition: A | Discharge status: Home / Self Care | Payer: Medicare HMO | Attending: Emergency Medicine | Admitting: Emergency Medicine

## 2011-04-18 ENCOUNTER — Other Ambulatory Visit: Payer: Self-pay | Admitting: Family Medicine

## 2011-04-18 DIAGNOSIS — R5381 Other malaise: Secondary | ICD-10-CM | POA: Insufficient documentation

## 2011-04-18 DIAGNOSIS — Z79899 Other long term (current) drug therapy: Secondary | ICD-10-CM | POA: Insufficient documentation

## 2011-04-18 DIAGNOSIS — M1990 Unspecified osteoarthritis, unspecified site: Secondary | ICD-10-CM | POA: Insufficient documentation

## 2011-04-18 DIAGNOSIS — K449 Diaphragmatic hernia without obstruction or gangrene: Secondary | ICD-10-CM | POA: Insufficient documentation

## 2011-04-18 DIAGNOSIS — D3000 Benign neoplasm of unspecified kidney: Secondary | ICD-10-CM | POA: Insufficient documentation

## 2011-04-18 DIAGNOSIS — F329 Major depressive disorder, single episode, unspecified: Secondary | ICD-10-CM | POA: Insufficient documentation

## 2011-04-18 DIAGNOSIS — F0390 Unspecified dementia without behavioral disturbance: Secondary | ICD-10-CM | POA: Insufficient documentation

## 2011-04-18 DIAGNOSIS — K219 Gastro-esophageal reflux disease without esophagitis: Secondary | ICD-10-CM | POA: Insufficient documentation

## 2011-04-18 DIAGNOSIS — Z794 Long term (current) use of insulin: Secondary | ICD-10-CM | POA: Insufficient documentation

## 2011-04-18 DIAGNOSIS — R5383 Other fatigue: Secondary | ICD-10-CM | POA: Insufficient documentation

## 2011-04-18 DIAGNOSIS — E119 Type 2 diabetes mellitus without complications: Secondary | ICD-10-CM | POA: Insufficient documentation

## 2011-04-18 DIAGNOSIS — F3289 Other specified depressive episodes: Secondary | ICD-10-CM | POA: Insufficient documentation

## 2011-04-18 LAB — BASIC METABOLIC PANEL
CO2: 26 mEq/L (ref 19–32)
Calcium: 9.4 mg/dL (ref 8.4–10.5)
Creatinine, Ser: 0.65 mg/dL (ref 0.50–1.10)

## 2011-04-18 LAB — CBC
HCT: 32.9 % — ABNORMAL LOW (ref 36.0–46.0)
MCHC: 32.5 g/dL (ref 30.0–36.0)
MCV: 79.7 fL (ref 78.0–100.0)
RDW: 17 % — ABNORMAL HIGH (ref 11.5–15.5)

## 2011-04-18 LAB — URINALYSIS, ROUTINE W REFLEX MICROSCOPIC
Bilirubin Urine: NEGATIVE
Glucose, UA: NEGATIVE mg/dL
Ketones, ur: NEGATIVE mg/dL
Leukocytes, UA: NEGATIVE
Protein, ur: NEGATIVE mg/dL

## 2011-04-18 LAB — DIFFERENTIAL
Basophils Absolute: 0 10*3/uL (ref 0.0–0.1)
Basophils Relative: 1 % (ref 0–1)
Eosinophils Relative: 1 % (ref 0–5)
Monocytes Absolute: 0.6 10*3/uL (ref 0.1–1.0)

## 2011-04-18 MED ORDER — IBUPROFEN 400 MG PO TABS
400.0000 mg | ORAL_TABLET | Freq: Once | ORAL | Status: AC
  Administered 2011-04-18: 400 mg via ORAL
  Filled 2011-04-18: qty 1

## 2011-04-18 MED ORDER — HYDROCODONE-ACETAMINOPHEN 5-325 MG PO TABS: 1.0000 | ORAL_TABLET | ORAL | Status: DC | PRN

## 2011-04-18 MED ORDER — HYDROCODONE-ACETAMINOPHEN 5-325 MG PO TABS
1.0000 | ORAL_TABLET | Freq: Once | ORAL | Status: AC
  Administered 2011-04-18: 1 via ORAL
  Filled 2011-04-18: qty 1

## 2011-04-18 NOTE — ED Provider Notes (Signed)
History   This chart was scribed for Felisa Bonier, MD by Clarita Crane. The patient was seen in room APA19/APA19 and the patient's care was started at 7:09AM.   CSN: [id number] Arrival date & time: 04/18/2011  6:01 AM   First MD Initiated Contact with Patient 04/18/11 0701      Chief Complaint  Patient presents with  . Fatigue  . Generalized Body Aches    (Consider location/radiation/quality/duration/timing/severity/associated sxs/prior treatment) HPI [name] is a 77 y.o. female who presents to the Emergency Department complaining of constant moderate generalized fatigue and weakness with associated myalgias, HA, decreased appetite, nausea, vomiting, diarrhea, cough, mild SOB, mild swelling of bilateral lower extremities and undocumented fever onset several days ago and persistent since. Patient has been evaluated in ED 2x within the last week for similar complaint and notes she has not been evaluated by PCP regarding current complaint. Patient with h/o hypertension, depression, pancreatitis, diabetes, anemia and reports use of chewing tobacco.   Past Medical History  Diagnosis Date  . Hypertension   . Reflux   . Right elbow pain     OTIF  . Dementia   . Depression   . Osteoarthritis   . Pancreatitis 11/2007    [facility]  . Angiomyolipoma of kidney     right  . Ulcerative esophagitis 12/05/2007    hx elevated gastrin, severe on EGD by Dr Jena Gauss , h pylori negative  . Hiatal hernia   . S/P colonoscopy 2009    pt reports normal by Dr Lovell Sheehan  . Diabetes mellitus   . Anemia     Past Surgical History  Procedure Date  . Orif right hip 1999    [facility]  . Umbilical hernia repair 37 months old    [place]  . Esophagogastroduodenoscopy 01/28/2011    Procedure: ESOPHAGOGASTRODUODENOSCOPY (EGD);  Surgeon: Arlyce Harman, MD;  Location: [facility];  Service: Endoscopy;  Laterality: N/A;  . Colonoscopy 01/28/2011    Procedure: COLONOSCOPY;  Surgeon: Arlyce Harman, MD;  Location: [facility];  Service: Endoscopy;  Laterality: N/A;  . Laparotomy 02/04/2011    Procedure: EXPLORATORY LAPAROTOMY;  Surgeon: Fabio Bering;  Location: [facility];  Service: General;  Laterality: N/A;    Family History  Problem Relation Age of Onset  . Cancer Mother     pelvic     History  Substance Use Topics  . Smoking status: Never Smoker   . Smokeless tobacco: Current User    Types: Chew  . Alcohol Use: No     Hx of Alcohol dependecy     OB History    Grav Para Term Preterm Abortions TAB SAB Ect Mult Living                  Review of Systems 10 Systems reviewed and are negative for acute change except as noted in the HPI.  Allergies  Other and Penicillins  Home Medications   Current Outpatient Rx  Name Route Sig Dispense Refill  . ACETAMINOPHEN 500 MG PO TABS Oral Take 1 tablet (500 mg total) by mouth 3 (three) times daily as needed for pain. 60 tablet 0  . AMLODIPINE BESYLATE 5 MG PO TABS Oral Take 1 tablet (5 mg total) by mouth daily. Take one tablet by mouth once a day  30 tablet 3  . BENAZEPRIL HCL 20 MG PO TABS Oral Take 1 tablet (20 mg total) by mouth daily. 30 tablet 11    Dose  increase effective 12/08/2010, plsdiscontinue ...  . CALCIUM CARBONATE-VITAMIN D 500-200 MG-UNIT PO TABS Oral Take 1 tablet by mouth daily.      Marland Kitchen DARIFENACIN HYDROBROMIDE 15 MG PO TB24 Oral Take 15 mg by mouth daily.     Marland Kitchen ENSURE PLUS PO LIQD Oral Take 1 Can by mouth daily. 30 Can 2  . ESOMEPRAZOLE MAGNESIUM 40 MG PO CPDR Oral Take 40 mg by mouth 2 (two) times daily.      Marland Kitchen GLIPIZIDE 10 MG PO TABS Oral Take 10 mg by mouth daily. THE DOSE HAS BEEN REDUCED TO 1 TABLET ONCE DAILY.  DO NOT TAKE THIS MEDICINE IF YOUR BLOOD SUGAR IS LESS THAN 130.     Marland Kitchen INSULIN GLARGINE 100 UNIT/ML [place] SOLN  15 Units 2 (two) times daily.     Marland Kitchen METFORMIN HCL 500 MG PO TABS Oral Take 500 mg by mouth 2 (two) times daily with a meal.      . ONE-DAILY MULTI VITAMINS PO TABS Oral Take 1 tablet by mouth daily.      Marland Kitchen  POLYETHYLENE GLYCOL 3350 PO PACK  ADD TO WATER DAILY FOR CONSTIPATION. 14 each   . POTASSIUM CHLORIDE CRYS CR 20 MEQ PO TBCR Oral Take 1 tablet (20 mEq total) by mouth daily. 30 tablet 1  . TEMAZEPAM 30 MG PO CAPS Oral Take 30 mg by mouth at bedtime as needed. Sleep       BP 142/77  Pulse 110  Temp(Src) 99 F (37.2 C) (Oral)  Resp 20  SpO2 96%  Physical Exam  Nursing note and vitals reviewed. Constitutional: She is oriented to person, place, and time. She appears well-developed and well-nourished. No distress.  HENT:  Head: Normocephalic and atraumatic.  Right Ear: Tympanic membrane normal.  Left Ear: Tympanic membrane normal.  Mouth/Throat: Oropharynx is clear and moist.       Moist mucous membranes.   Eyes: EOM are normal. Pupils are equal, round, and reactive to light.  Neck: Neck supple. No tracheal deviation present.  Cardiovascular: Normal rate, regular rhythm and normal heart sounds.  Exam reveals no gallop and no friction rub.   No murmur heard. Pulmonary/Chest: Effort normal and breath sounds normal. No respiratory distress. She has no wheezes. She has no rales.  Abdominal: Soft. Bowel sounds are normal. She exhibits no distension. There is no tenderness.  Musculoskeletal: She exhibits no edema and no tenderness.  Neurological: She is alert and oriented to person, place, and time. No sensory deficit.  Skin: Skin is warm and dry.  Psychiatric: She has a normal mood and affect. Her behavior is normal.    ED Course  Procedures (including critical care time)  DIAGNOSTIC STUDIES: Oxygen Saturation is 96% on room air, normal by my interpretation.    COORDINATION OF CARE:    Labs Reviewed  GLUCOSE, CAPILLARY - Abnormal; Notable for the following:    Glucose-Capillary 132 (*)    All other components within normal limits  CBC - Abnormal; Notable for the following:    Hemoglobin 10.7 (*)    HCT 32.9 (*)    MCH 25.9 (*)    RDW 17.0 (*)    Platelets 405 (*)    All  other components within normal limits  BASIC METABOLIC PANEL - Abnormal; Notable for the following:    Sodium 130 (*)    Chloride 95 (*)    GFR calc non Af Amer 83 (*)    All other components within normal limits  DIFFERENTIAL  URINALYSIS, ROUTINE W REFLEX MICROSCOPIC   No results found.   No diagnosis found.    MDM  DDX: Viral syndrome, dehydration, electrolyte abnormality, UTI.  Do not suspect pneumonia as patient had CXR 4 days ago which was negative and physical exam doesn't suggest. As a result, a CXR will not be obtained today.  No serious organic cause of the patient's weakness or muscle aches is found.  I personally performed the services described in this documentation, which was scribed in my presence. The recorded information has been reviewed and considered.   Felisa Bonier, MD 04/18/11 [phone]

## 2011-04-18 NOTE — ED Notes (Signed)
POCT CBG results 132, notified nurse

## 2011-04-18 NOTE — ED Notes (Signed)
Pt reports general aches and pains, weakness and unable to eat or sleep.  Reports BS this morning was 82.  LBM last night.

## 2011-04-18 NOTE — ED Notes (Addendum)
Pt reports continued generalized weakness and body aches.  Reports she has not been eating or sleeping well in past 2 weeks.  Pt reports she has been checking BS, and last BM was before bed.   No acute distress noted at this time.

## 2011-04-18 NOTE — ED Notes (Signed)
Patient alert and oriented x 4.  Respirations even and unlabored.  No acute distress at this time.  Discharge instructions and Rx x 1 reviewed with patient and patient verbalized understanding.  Pt transported home by cab.

## 2011-04-21 ENCOUNTER — Encounter: Payer: Self-pay | Admitting: Family Medicine

## 2011-04-21 ENCOUNTER — Ambulatory Visit (INDEPENDENT_AMBULATORY_CARE_PROVIDER_SITE_OTHER): Payer: Medicare HMO | Admitting: Family Medicine

## 2011-04-21 VITALS — BP 110/68 | HR 121 | Resp 16 | Ht 64.5 in | Wt 167.4 lb

## 2011-04-21 DIAGNOSIS — G4700 Insomnia, unspecified: Secondary | ICD-10-CM

## 2011-04-21 DIAGNOSIS — N39498 Other specified urinary incontinence: Secondary | ICD-10-CM

## 2011-04-21 DIAGNOSIS — R51 Headache: Secondary | ICD-10-CM

## 2011-04-21 DIAGNOSIS — Z23 Encounter for immunization: Secondary | ICD-10-CM

## 2011-04-21 DIAGNOSIS — E119 Type 2 diabetes mellitus without complications: Secondary | ICD-10-CM

## 2011-04-21 DIAGNOSIS — I10 Essential (primary) hypertension: Secondary | ICD-10-CM

## 2011-04-21 LAB — POCT URINALYSIS DIPSTICK
Bilirubin, UA: NEGATIVE
Blood, UA: NEGATIVE
Ketones, UA: NEGATIVE
Nitrite, UA: NEGATIVE
Spec Grav, UA: 1.015
pH, UA: 5

## 2011-04-21 MED ORDER — QUETIAPINE FUMARATE 25 MG PO TABS: 25.0000 mg | ORAL_TABLET | Freq: Every day | ORAL | Status: DC

## 2011-04-21 NOTE — Patient Instructions (Signed)
F/U in 5 weeks.  I am going to have social services check on you, I believe you need to stay in a family home.  I have the name and # you gave me for your stepdaughter also, Lewanda Rife, #[phone]  TdAP and flu vaccine today.  New med for apetite and sleep

## 2011-04-23 ENCOUNTER — Other Ambulatory Visit: Payer: Self-pay | Admitting: Family Medicine

## 2011-04-24 ENCOUNTER — Telehealth: Payer: Self-pay

## 2011-04-24 MED ORDER — BUTALBITAL-APAP-CAFFEINE 50-325-40 MG PO TABS: ORAL_TABLET | ORAL | Status: DC

## 2011-04-24 NOTE — Telephone Encounter (Signed)
Let pt know she just had a head scan the end of July which did not show any major abnormality, no mass, bleeding or fluid.  I am going to refer her to the specialist for headaxches here , Dr Gerilyn Pilgrim, since they are such a problem, let her know

## 2011-04-24 NOTE — Telephone Encounter (Signed)
Patient aware.

## 2011-04-24 NOTE — Telephone Encounter (Signed)
Instead of neurology referral right now , I am trying the fioricet tablets, and have sent them in , let pt know. If no better next week call she will be referred to neurology, let her know please

## 2011-04-24 NOTE — Assessment & Plan Note (Addendum)
Uncontrolled and worsening, will refer to neurology if no response to fioricet

## 2011-04-26 ENCOUNTER — Emergency Department (HOSPITAL_COMMUNITY)
Admission: EM | Admit: 2011-04-26 | Discharge: 2011-04-26 | Disposition: A | Discharge status: Home / Self Care | Payer: Medicare HMO | Attending: Emergency Medicine | Admitting: Emergency Medicine

## 2011-04-26 ENCOUNTER — Emergency Department (HOSPITAL_COMMUNITY): Payer: Medicare HMO

## 2011-04-26 ENCOUNTER — Encounter (HOSPITAL_COMMUNITY): Payer: Self-pay

## 2011-04-26 DIAGNOSIS — Z862 Personal history of diseases of the blood and blood-forming organs and certain disorders involving the immune mechanism: Secondary | ICD-10-CM | POA: Insufficient documentation

## 2011-04-26 DIAGNOSIS — I10 Essential (primary) hypertension: Secondary | ICD-10-CM | POA: Insufficient documentation

## 2011-04-26 DIAGNOSIS — F0390 Unspecified dementia without behavioral disturbance: Secondary | ICD-10-CM | POA: Insufficient documentation

## 2011-04-26 DIAGNOSIS — K449 Diaphragmatic hernia without obstruction or gangrene: Secondary | ICD-10-CM | POA: Insufficient documentation

## 2011-04-26 DIAGNOSIS — R51 Headache: Secondary | ICD-10-CM | POA: Insufficient documentation

## 2011-04-26 DIAGNOSIS — Z79899 Other long term (current) drug therapy: Secondary | ICD-10-CM | POA: Insufficient documentation

## 2011-04-26 DIAGNOSIS — Z794 Long term (current) use of insulin: Secondary | ICD-10-CM | POA: Insufficient documentation

## 2011-04-26 DIAGNOSIS — E119 Type 2 diabetes mellitus without complications: Secondary | ICD-10-CM | POA: Insufficient documentation

## 2011-04-26 MED ORDER — MORPHINE SULFATE 2 MG/ML IJ SOLN
2.0000 mg | Freq: Once | INTRAMUSCULAR | Status: AC
  Administered 2011-04-26: 2 mg via INTRAVENOUS
  Filled 2011-04-26: qty 1

## 2011-04-26 MED ORDER — HYDROMORPHONE HCL 1 MG/ML IJ SOLN
1.0000 mg | Freq: Once | INTRAMUSCULAR | Status: AC
  Administered 2011-04-26: 1 mg via INTRAVENOUS
  Filled 2011-04-26: qty 1

## 2011-04-26 MED ORDER — ONDANSETRON HCL 4 MG/2ML IJ SOLN
4.0000 mg | Freq: Once | INTRAMUSCULAR | Status: AC
  Administered 2011-04-26: 4 mg via INTRAVENOUS
  Filled 2011-04-26: qty 2

## 2011-04-26 MED ORDER — HYDROCODONE-ACETAMINOPHEN 5-325 MG PO TABS: 1.0000 | ORAL_TABLET | Freq: Four times a day (QID) | ORAL | Status: DC | PRN

## 2011-04-26 MED ORDER — SODIUM CHLORIDE 0.9 % IV SOLN
INTRAVENOUS | Status: DC
  Administered 2011-04-26: 12:00:00 via INTRAVENOUS

## 2011-04-26 NOTE — ED Provider Notes (Signed)
Scribed for Shelda Jakes, MD, the patient was seen in room APA09/APA09. This chart was scribed by [employer]. The patient's care started at 10:32  CSN: [id number] Arrival date & time: 04/26/2011 10:32 AM   First MD Initiated Contact with Patient 04/26/11 1032      Chief Complaint  Patient presents with  . Headache   HPI Level 5 Caveat for Dementia [name] is a 77 y.o. female who presents to the Emergency Department complaining of Headache for 2-3 days. Headache is worse today and was constant all through the night. Headache is localized to the forehead and is non radiating. Describes headache as sharp and ranks at 8/10 on NPS. Patient also reports a history of similar symptoms with headaches. States that current symptoms are worse.  Denies chest pain, abdominal pain, neck pain, back pain, rash, swelling in the lower extremities,nausea or vomiting. Reports associated weakness to the bilateral lower extremities and mild fever. Patient states she fell yesterday evening but did not hurt herself. Denies a history of strokes  Past Medical History  Diagnosis Date  . Hypertension   . Reflux   . Right elbow pain     OTIF  . Dementia   . Depression   . Osteoarthritis   . Pancreatitis 11/2007    [facility]  . Angiomyolipoma of kidney     right  . Ulcerative esophagitis 12/05/2007    hx elevated gastrin, severe on EGD by Dr Jena Gauss , h pylori negative  . Hiatal hernia   . S/P colonoscopy 2009    pt reports normal by Dr Lovell Sheehan  . Diabetes mellitus   . Anemia     Past Surgical History  Procedure Date  . Orif right hip 1999    [facility]  . Umbilical hernia repair 82 months old    [place]  . Esophagogastroduodenoscopy 01/28/2011    Procedure: ESOPHAGOGASTRODUODENOSCOPY (EGD);  Surgeon: Arlyce Harman, MD;  [facility];  Service: Endoscopy;  Laterality: N/A;  . Colonoscopy 01/28/2011    Procedure: COLONOSCOPY;  Surgeon: Arlyce Harman, MD;  [facility];  Service:  Endoscopy;  Laterality: N/A;  . Laparotomy 02/04/2011    Procedure: EXPLORATORY LAPAROTOMY;  Surgeon: Fabio Bering;  Location: [facility];  Service: General;  Laterality: N/A;    Family History  Problem Relation Age of Onset  . Cancer Mother     pelvic     History  Substance Use Topics  . Smoking status: Never Smoker   . Smokeless tobacco: Current User    Types: Chew  . Alcohol Use: No     Hx of Alcohol dependecy     OB History    Grav Para Term Preterm Abortions TAB SAB Ect Mult Living                  Review of Systems  Unable to perform ROS: Dementia  Constitutional: Positive for fever.  HENT: Negative for neck pain.   Respiratory: Negative for shortness of breath.   Cardiovascular: Negative for chest pain.  Gastrointestinal: Negative for nausea, vomiting and abdominal pain.  Musculoskeletal: Negative for back pain.       No swelling in the bilateral lower extremities  Skin: Negative for rash.  Neurological:       Mild weakness in the bilateral lower extremities    Allergies  Other and Penicillins  Home Medications   Current Outpatient Rx  Name Route Sig Dispense Refill  . ACETAMINOPHEN 500 MG  PO TABS Oral Take 1 tablet (500 mg total) by mouth 3 (three) times daily as needed for pain. 60 tablet 0  . AMLODIPINE BESYLATE 5 MG PO TABS Oral Take 1 tablet (5 mg total) by mouth daily. Take one tablet by mouth once a day  30 tablet 3  . BENAZEPRIL HCL 20 MG PO TABS Oral Take 1 tablet (20 mg total) by mouth daily. 30 tablet 11    Dose increase effective 12/08/2010, plsdiscontinue ...  . BUTALBITAL-APAP-CAFFEINE 50-325-40 MG PO TABS  One tablet twice daily as needed for severe headache 40 tablet 2  . CALCIUM CARBONATE-VITAMIN D 500-200 MG-UNIT PO TABS Oral Take 1 tablet by mouth daily.      . ENABLEX 15 MG PO TB24  TAKE 1 TABLET BY MOUTH ONCE A DAY FOR BLADDER. 30 each 3  . ENSURE PLUS PO LIQD Oral Take 1 Can by mouth daily. 30 Can 2  . ESOMEPRAZOLE MAGNESIUM 40 MG PO  CPDR Oral Take 40 mg by mouth 2 (two) times daily.      Marland Kitchen GLIPIZIDE 10 MG PO TABS Oral Take 10 mg by mouth daily. THE DOSE HAS BEEN REDUCED TO 1 TABLET ONCE DAILY.  DO NOT TAKE THIS MEDICINE IF YOUR BLOOD SUGAR IS LESS THAN 130.     Marland Kitchen INSULIN GLARGINE 100 UNIT/ML [place] SOLN  15 Units 2 (two) times daily.     Marland Kitchen METFORMIN HCL 1000 MG PO TABS  TAKE ONE TABLET BY MOUTH TWICE DAILY WITH FOOD FORDIABETES. 60 tablet 3  . ONE-DAILY MULTI VITAMINS PO TABS Oral Take 1 tablet by mouth daily.      Marland Kitchen POLYETHYLENE GLYCOL 3350 PO PACK  ADD TO WATER DAILY FOR CONSTIPATION. 14 each   . POTASSIUM CHLORIDE CRYS CR 20 MEQ PO TBCR Oral Take 1 tablet (20 mEq total) by mouth daily. 30 tablet 1  . QUETIAPINE FUMARATE 25 MG PO TABS Oral Take 1 tablet (25 mg total) by mouth at bedtime. 30 tablet 3    BP 116/71  Pulse 116  Temp(Src) 98.9 F (37.2 C) (Oral)  Resp 20  Ht 5' 6.5" (1.689 m)  Wt 172 lb (78.019 kg)  BMI 27.35 kg/m2  SpO2 98%  Physical Exam  Nursing note and vitals reviewed. Constitutional: She is oriented to person, place, and time. She appears well-developed and well-nourished. No distress.       Awake, alert, nontoxic appearance with baseline speech for patient.  HENT:  Head: Normocephalic and atraumatic.  Mouth/Throat: Oropharynx is clear and moist. No oropharyngeal exudate.  Eyes: EOM are normal. Pupils are equal, round, and reactive to light. Right eye exhibits no discharge. Left eye exhibits no discharge.  Neck: Neck supple.  Cardiovascular: Normal rate and regular rhythm.   No murmur heard. Pulmonary/Chest: Effort normal and breath sounds normal. No stridor. No respiratory distress. She has no wheezes. She has no rales. She exhibits no tenderness.  Abdominal: Soft. Bowel sounds are normal. She exhibits no mass. There is no tenderness. There is no rebound.  Musculoskeletal: Normal range of motion. She exhibits no edema and no tenderness.       Baseline ROM, moves extremities with no obvious new  focal weakness.  Lymphadenopathy:    She has no cervical adenopathy.  Neurological: She is alert and oriented to person, place, and time. No cranial nerve deficit.       Awake, alert, cooperative and aware of situation; no facial asymmetry; tongue midline; major cranial nerves appear intact;  Skin: Skin is warm and dry. No rash noted. No erythema.  Psychiatric: She has a normal mood and affect. Her behavior is normal.    ED Course  Procedures   Labs Reviewed - No data to display  Ct Head Wo Contrast  04/26/2011  *RADIOLOGY REPORT*  Clinical Data: Left frontal headache, fall  CT HEAD WITHOUT CONTRAST  Technique:  Contiguous axial images were obtained from the base of the skull through the vertex without contrast.  Comparison: 01/25/2011  Findings: No evidence of parenchymal hemorrhage or extra-axial fluid collection. No mass lesion, mass effect, or midline shift.  No CT evidence of acute infarction.  Subcortical white matter and periventricular small vessel ischemic changes.  Intracranial atherosclerosis.  Cortical and cerebellar atrophy.  No ventriculomegaly.  The visualized paranasal sinuses are essentially clear. The mastoid air cells are unopacified.  No evidence of calvarial fracture.  IMPRESSION: No evidence of acute intracranial abnormality.  Atrophy with small vessel ischemic changes and intracranial atherosclerosis.  Original Report Authenticated By: Charline Bills, M.D.     No diagnosis found.  DIAGNOSTIC STUDIES: Oxygen Saturation is 98% on room air, normal by my interpretation.    COORDINATION OF CARE: 10:53 - EDP examined patient at bedside. The following orders have been placed. Orders Placed This Encounter  Procedures  . CT Head Wo Contrast        MDM    CT scan of the patient's head without any acute findings. Patient has a history of headaches frequently. Suspect this is a similar headache. Patient improved with pain medicines in the emergency department. At  bedtime follow up with her primary care doctor.    MEDICATIONS GIVEN IN THE E.D.  Medications  amLODipine (NORVASC) 5 MG tablet (not administered)  0.9 %  sodium chloride infusion (  Intravenous New Bag 04/26/11 1131)  ondansetron (ZOFRAN) injection 4 mg (4 mg Intravenous Given 04/26/11 1130)  morphine 2 MG/ML injection 2 mg (2 mg Intravenous Given 04/26/11 1131)    DISCHARGE MEDICATIONS: New Prescriptions   No medications on file    SCRIBE ATTESTATION:   I personally performed the services described in this documentation, which was scribed in my presence. The recorded information has been reviewed and considered.         Shelda Jakes, MD 04/26/11 1430

## 2011-04-26 NOTE — ED Notes (Signed)
Complain of headache and dizziness since last night

## 2011-04-28 ENCOUNTER — Other Ambulatory Visit: Payer: Self-pay | Admitting: Family Medicine

## 2011-04-29 ENCOUNTER — Encounter: Payer: Self-pay | Admitting: Family Medicine

## 2011-04-29 ENCOUNTER — Ambulatory Visit (INDEPENDENT_AMBULATORY_CARE_PROVIDER_SITE_OTHER): Payer: Medicare HMO | Admitting: Family Medicine

## 2011-04-29 VITALS — BP 120/70 | HR 133 | Resp 16 | Ht 64.5 in | Wt 167.0 lb

## 2011-04-29 DIAGNOSIS — R51 Headache: Secondary | ICD-10-CM

## 2011-04-29 DIAGNOSIS — N39498 Other specified urinary incontinence: Secondary | ICD-10-CM

## 2011-04-29 MED ORDER — METFORMIN HCL 1000 MG PO TABS: ORAL_TABLET | ORAL | Status: DC

## 2011-04-29 MED ORDER — AMLODIPINE BESYLATE 5 MG PO TABS: 5.0000 mg | ORAL_TABLET | Freq: Every day | ORAL | Status: DC

## 2011-04-29 MED ORDER — GLIPIZIDE 10 MG PO TABS: 10.0000 mg | ORAL_TABLET | Freq: Every day | ORAL | Status: DC

## 2011-04-29 MED ORDER — POTASSIUM CHLORIDE CRYS ER 20 MEQ PO TBCR: 20.0000 meq | EXTENDED_RELEASE_TABLET | Freq: Every day | ORAL | Status: DC

## 2011-04-29 NOTE — Assessment & Plan Note (Signed)
Pt has history of urinary incontinence which is not new, no signs of infection given, discussed she has a medication for her bladder and frequency. Continue depends

## 2011-04-29 NOTE — Progress Notes (Signed)
  Subjective:    Patient ID: [name], female    DOB: 10-Sep-1931, 77 y.o.   MRN: [id number]  HPI      Pt was not very clear why she was at visit today.  Nocturia- she states she goes to the bathroom 8 x a night, was up all night with this problem but typically goes a lot, wears depends, no dysuria , no hematuria, has enablex but did not seem to know she had the medication.        Denies fever, abd pain   Headache- persistant HA on and off for past 5 months, using fiorcet which helps some, headaches come and go across her forehead. Has been seen in the ED multiple times for this and by PCP. CT of head neg x2 at [facility], with exception of microvascular changes Recently started on the fiorcet. I reviewed PCP note, if this did not help plan to send to neurology. Has had MRI which did not show any specific cause of pain.  Denies current HA, N/V or dizziness, but typically experiences dizziness with pain    Leg pain- occ gets leg pains, that come on and off, no swelling, no pain recently, can not give any specifics  Review of Systems - per above, difficult historian, her AIDE was not availble, I tried to call during the visit     Objective:   Physical Exam GEN- NAD, alert and oriented x3 HEENT- PERRL, EOMI, nMMM, oropharynx clear Neck- Supple, no pain with ROM CVS- Tachycardic HR 100 on repeat no murmur RESP-CTAB EXT- No edema, legs non tender to palpation Pulses- Radial, DP- 2+ Neuro- CNII-XII grossly in tact, no focal deficits, unable to answer questions fully        Assessment & Plan:

## 2011-04-29 NOTE — Patient Instructions (Signed)
For your bladder continue the Enablex, this keeps you from urinating as much For your headaches- use the fiorcet for pain, we will send a referral to the Neurologist for your headaches I have refilled some of your medications Take your morning meds when you get home. Follow-up with Dr. Lodema Hong as scheduled in 5 weeks.

## 2011-04-29 NOTE — Assessment & Plan Note (Signed)
Will refer to neurology, I feel she will continue to seek care at the ED even without any particular reason for HA at this time. Continue Firocet as it gives some relief

## 2011-05-01 ENCOUNTER — Encounter (HOSPITAL_COMMUNITY): Payer: Self-pay

## 2011-05-01 ENCOUNTER — Emergency Department (HOSPITAL_COMMUNITY)
Admission: EM | Admit: 2011-05-01 | Discharge: 2011-05-01 | Disposition: A | Discharge status: Home / Self Care | Payer: Medicare HMO | Attending: Emergency Medicine | Admitting: Emergency Medicine

## 2011-05-01 DIAGNOSIS — F0390 Unspecified dementia without behavioral disturbance: Secondary | ICD-10-CM | POA: Insufficient documentation

## 2011-05-01 DIAGNOSIS — D3000 Benign neoplasm of unspecified kidney: Secondary | ICD-10-CM | POA: Insufficient documentation

## 2011-05-01 DIAGNOSIS — K861 Other chronic pancreatitis: Secondary | ICD-10-CM | POA: Insufficient documentation

## 2011-05-01 DIAGNOSIS — M1990 Unspecified osteoarthritis, unspecified site: Secondary | ICD-10-CM | POA: Insufficient documentation

## 2011-05-01 DIAGNOSIS — Z862 Personal history of diseases of the blood and blood-forming organs and certain disorders involving the immune mechanism: Secondary | ICD-10-CM | POA: Insufficient documentation

## 2011-05-01 DIAGNOSIS — K449 Diaphragmatic hernia without obstruction or gangrene: Secondary | ICD-10-CM | POA: Insufficient documentation

## 2011-05-01 DIAGNOSIS — R51 Headache: Secondary | ICD-10-CM

## 2011-05-01 DIAGNOSIS — I10 Essential (primary) hypertension: Secondary | ICD-10-CM | POA: Insufficient documentation

## 2011-05-01 DIAGNOSIS — F329 Major depressive disorder, single episode, unspecified: Secondary | ICD-10-CM | POA: Insufficient documentation

## 2011-05-01 DIAGNOSIS — E119 Type 2 diabetes mellitus without complications: Secondary | ICD-10-CM | POA: Insufficient documentation

## 2011-05-01 DIAGNOSIS — F3289 Other specified depressive episodes: Secondary | ICD-10-CM | POA: Insufficient documentation

## 2011-05-01 MED ORDER — IBUPROFEN 800 MG PO TABS
800.0000 mg | ORAL_TABLET | Freq: Once | ORAL | Status: AC
  Administered 2011-05-01: 800 mg via ORAL
  Filled 2011-05-01: qty 1

## 2011-05-01 MED ORDER — ONDANSETRON HCL 4 MG PO TABS: 4.0000 mg | ORAL_TABLET | Freq: Four times a day (QID) | ORAL | Status: DC

## 2011-05-01 MED ORDER — HYDROCODONE-ACETAMINOPHEN 5-325 MG PO TABS
1.0000 | ORAL_TABLET | Freq: Once | ORAL | Status: AC
  Administered 2011-05-01: 1 via ORAL
  Filled 2011-05-01: qty 1

## 2011-05-01 MED ORDER — HYDROCODONE-ACETAMINOPHEN 5-325 MG PO TABS: 1.0000 | ORAL_TABLET | ORAL | Status: AC | PRN

## 2011-05-01 MED ORDER — ONDANSETRON 8 MG PO TBDP
8.0000 mg | ORAL_TABLET | Freq: Once | ORAL | Status: AC
  Administered 2011-05-01: 8 mg via ORAL
  Filled 2011-05-01: qty 1

## 2011-05-01 NOTE — ED Provider Notes (Signed)
History   This chart was scribed for Donnetta Hutching, MD by Clarita Crane. The patient was seen in room [facility]19[facility] and the patient's care was started at 8:10AM.   CSN: [id number] Arrival date & time: 05/01/2011  8:04 AM   First MD Initiated Contact with Patient 05/01/11 0805      Chief Complaint  Patient presents with  . Headache   HPI [name] is a 77 y.o. female who presents to the Emergency Department complaining of constant moderate, non-radiating HA localized to forehead onset 1 week ago and persistent since. Notes having history of similar HAs but states current HA is not relieved with use Tylenol as others have been in the past. Additionally, patient c/o mild left lateral abdominal pain onset several days ago and persistent since with associated decreased appetite and n/v/d. Patient with h/o hypertension, dementia, pancreatitis, hiatal hernia, diabetes mellitus, anemia.   Past Medical History  Diagnosis Date  . Hypertension   . Reflux   . Right elbow pain     OTIF  . Dementia   . Depression   . Osteoarthritis   . Pancreatitis 11/2007    [facility]  . Angiomyolipoma of kidney     right  . Ulcerative esophagitis 12/05/2007    hx elevated gastrin, severe on EGD by Dr Jena Gauss , h pylori negative  . Hiatal hernia   . S/P colonoscopy 2009    pt reports normal by Dr Lovell Sheehan  . Diabetes mellitus   . Anemia     Past Surgical History  Procedure Date  . Orif right hip 1999    [facility]  . Umbilical hernia repair 69 months old    [place]  . Esophagogastroduodenoscopy 01/28/2011    Procedure: ESOPHAGOGASTRODUODENOSCOPY (EGD);  Surgeon: Arlyce Harman, MD;  Location: [facility];  Service: Endoscopy;  Laterality: N/A;  . Colonoscopy 01/28/2011    Procedure: COLONOSCOPY;  Surgeon: Arlyce Harman, MD;  Location: [facility];  Service: Endoscopy;  Laterality: N/A;  . Laparotomy 02/04/2011    Procedure: EXPLORATORY LAPAROTOMY;  Surgeon: Fabio Bering;  Location: [facility];  Service: General;   Laterality: N/A;    Family History  Problem Relation Age of Onset  . Cancer Mother     pelvic     History  Substance Use Topics  . Smoking status: Never Smoker   . Smokeless tobacco: Current User    Types: Chew  . Alcohol Use: No     Hx of Alcohol dependecy     OB History    Grav Para Term Preterm Abortions TAB SAB Ect Mult Living                  Review of Systems 10 Systems reviewed and are negative for acute change except as noted in the HPI.  Allergies  Other and Penicillins  Home Medications   Current Outpatient Rx  Name Route Sig Dispense Refill  . ACETAMINOPHEN 500 MG PO TABS Oral Take 1 tablet (500 mg total) by mouth 3 (three) times daily as needed for pain. 60 tablet 0  . AMLODIPINE BESYLATE 5 MG PO TABS Oral Take 1 tablet (5 mg total) by mouth daily. 30 tablet 3  . BUTALBITAL-APAP-CAFFEINE 50-325-40 MG PO TABS  One tablet twice daily as needed for severe headache 40 tablet 2  . CALCIUM CARBONATE-VITAMIN D 500-200 MG-UNIT PO TABS Oral Take 1 tablet by mouth daily.      . ENABLEX 15 MG PO TB24  TAKE 1  TABLET BY MOUTH ONCE A DAY FOR BLADDER. 30 each 3  . ENSURE PLUS PO LIQD Oral Take 1 Can by mouth daily. 30 Can 2  . ESOMEPRAZOLE MAGNESIUM 40 MG PO CPDR Oral Take 40 mg by mouth 2 (two) times daily.      Marland Kitchen GLIPIZIDE 10 MG PO TABS Oral Take 1 tablet (10 mg total) by mouth daily. DO NOT TAKE THIS MEDICINE IF YOUR BLOOD SUGAR IS LESS THAN 130. 30 tablet 3  . HYDROCODONE-ACETAMINOPHEN 5-325 MG PO TABS Oral Take 1-2 tablets by mouth every 6 (six) hours as needed for pain. 10 tablet 0  . INSULIN GLARGINE 100 UNIT/ML [place] SOLN  15 Units 2 (two) times daily.     Marland Kitchen LOTENSIN 20 MG PO TABS  TAKE ONE TABLET DAILY. 30 each 3  . METFORMIN HCL 1000 MG PO TABS  TAKE ONE TABLET BY MOUTH TWICE DAILY WITH FOOD FORDIABETES. 60 tablet 3  . ONE-DAILY MULTI VITAMINS PO TABS Oral Take 1 tablet by mouth daily.      Marland Kitchen POLYETHYLENE GLYCOL 3350 PO PACK  ADD TO WATER DAILY FOR CONSTIPATION. 14  each   . POTASSIUM CHLORIDE CRYS CR 20 MEQ PO TBCR Oral Take 1 tablet (20 mEq total) by mouth daily. 30 tablet 1  . QUETIAPINE FUMARATE 25 MG PO TABS Oral Take 1 tablet (25 mg total) by mouth at bedtime. 30 tablet 3    BP 147/81  Pulse 128  Temp(Src) 98.7 F (37.1 C) (Oral)  Resp 18  Ht 5' 6.5" (1.689 m)  Wt 162 lb (73.483 kg)  BMI 25.76 kg/m2  SpO2 100%  Physical Exam  Nursing note and vitals reviewed. Constitutional: She is oriented to person, place, and time. She appears well-developed and well-nourished. No distress.  HENT:  Head: Normocephalic and atraumatic.  Eyes: EOM are normal. Pupils are equal, round, and reactive to light.  Neck: Neck supple. No tracheal deviation present.  Cardiovascular: Normal rate and regular rhythm.   No murmur heard. Pulmonary/Chest: Effort normal. No respiratory distress. She has no wheezes.  Abdominal: Soft. She exhibits no distension. There is tenderness.       Tender to palpation of left lateral abdomen.   Musculoskeletal: Normal range of motion. She exhibits no edema.  Neurological: She is alert and oriented to person, place, and time. No sensory deficit.  Skin: Skin is warm and dry.  Psychiatric: She has a normal mood and affect. Her behavior is normal.    ED Course  Procedures (including critical care time)  DIAGNOSTIC STUDIES: Oxygen Saturation is 100% on room air, normal by my interpretation.    COORDINATION OF CARE: 8:14AM- Plan to administer Vicodin, Ibuprofen and Zofran and monitor patient progress in ED. 9:56AM- Patient expresses interest in being d/c home and reports she is feeling better at this time.     Labs Reviewed - No data to display No results found.   No diagnosis found.    MDM  Complains of frontal headache. No neurological deficits or stiffneck. Patient is alert and oriented.  Can discharge home on Norco and Zofran.     I personally performed the services described in this documentation, which was  scribed in my presence. The recorded information has been reviewed and considered.    Donnetta Hutching, MD 05/01/11 1027

## 2011-05-01 NOTE — ED Notes (Signed)
Pt states she has a headache and tylenol did not help

## 2011-05-01 NOTE — ED Notes (Signed)
Pt states pain med helped a little but still rates her pain 8/10. edp aware

## 2011-05-03 ENCOUNTER — Emergency Department (HOSPITAL_COMMUNITY)
Admission: EM | Admit: 2011-05-03 | Discharge: 2011-05-03 | Disposition: A | Discharge status: Home / Self Care | Payer: Medicare HMO | Attending: Emergency Medicine | Admitting: Emergency Medicine

## 2011-05-03 ENCOUNTER — Encounter (HOSPITAL_COMMUNITY): Payer: Self-pay

## 2011-05-03 DIAGNOSIS — Z862 Personal history of diseases of the blood and blood-forming organs and certain disorders involving the immune mechanism: Secondary | ICD-10-CM | POA: Insufficient documentation

## 2011-05-03 DIAGNOSIS — I10 Essential (primary) hypertension: Secondary | ICD-10-CM | POA: Insufficient documentation

## 2011-05-03 DIAGNOSIS — K861 Other chronic pancreatitis: Secondary | ICD-10-CM | POA: Insufficient documentation

## 2011-05-03 DIAGNOSIS — E119 Type 2 diabetes mellitus without complications: Secondary | ICD-10-CM | POA: Insufficient documentation

## 2011-05-03 DIAGNOSIS — K209 Esophagitis, unspecified without bleeding: Secondary | ICD-10-CM | POA: Insufficient documentation

## 2011-05-03 DIAGNOSIS — R110 Nausea: Secondary | ICD-10-CM | POA: Insufficient documentation

## 2011-05-03 DIAGNOSIS — F0390 Unspecified dementia without behavioral disturbance: Secondary | ICD-10-CM | POA: Insufficient documentation

## 2011-05-03 DIAGNOSIS — G44209 Tension-type headache, unspecified, not intractable: Secondary | ICD-10-CM | POA: Insufficient documentation

## 2011-05-03 DIAGNOSIS — K449 Diaphragmatic hernia without obstruction or gangrene: Secondary | ICD-10-CM | POA: Insufficient documentation

## 2011-05-03 DIAGNOSIS — D3000 Benign neoplasm of unspecified kidney: Secondary | ICD-10-CM | POA: Insufficient documentation

## 2011-05-03 MED ORDER — PROMETHAZINE HCL 25 MG/ML IJ SOLN
12.5000 mg | Freq: Once | INTRAMUSCULAR | Status: AC
  Administered 2011-05-03: 12.5 mg via INTRAVENOUS
  Filled 2011-05-03: qty 1

## 2011-05-03 MED ORDER — DIPHENHYDRAMINE HCL 50 MG/ML IJ SOLN
25.0000 mg | Freq: Once | INTRAMUSCULAR | Status: AC
  Administered 2011-05-03: 25 mg via INTRAVENOUS
  Filled 2011-05-03: qty 1

## 2011-05-03 MED ORDER — KETOROLAC TROMETHAMINE 30 MG/ML IJ SOLN
30.0000 mg | Freq: Once | INTRAMUSCULAR | Status: AC
  Administered 2011-05-03: 30 mg via INTRAVENOUS
  Filled 2011-05-03: qty 1

## 2011-05-03 MED ORDER — BUTALBITAL-APAP-CAFFEINE 50-325-40 MG PO TABS: 1.0000 | ORAL_TABLET | Freq: Three times a day (TID) | ORAL | Status: DC | PRN

## 2011-05-03 MED ORDER — SODIUM CHLORIDE 0.9 % IV BOLUS (SEPSIS)
1000.0000 mL | Freq: Once | INTRAVENOUS | Status: AC
  Administered 2011-05-03: 1000 mL via INTRAVENOUS

## 2011-05-03 NOTE — ED Notes (Signed)
Headache for 8 hours, has taken tylenol w. No relief,  Arrived by ems

## 2011-05-03 NOTE — ED Provider Notes (Signed)
History     CSN: [id number] Arrival date & time: 05/03/2011 12:33 AM   First MD Initiated Contact with Patient 05/03/11 0043      Chief Complaint  Patient presents with  . Headache    (Consider location/radiation/quality/duration/timing/severity/associated sxs/prior treatment) HPI Comments: The patient is a 77 year old female who presents for evaluation of a headache, dull in character, bifrontal in location, as well as occipital, described as constricting, and they'll. The headache began gradually approximately 8 hours ago earlier this afternoon and gradually got worse. Patient has had some mild nausea but no vomiting. She tried taking Tylenol at home without any relief. She is prescribed Fioricet but did not take this. She denies any head trauma. She denies any focal numbness or weakness, and denies change in vision or hearing. She denies any neck pain. She has had a CT scan of her head within the last week that was negative for focal neurologic pathology.  Patient is a 77 y.o. female presenting with headaches. The history is provided by the patient.  Headache  This is a new problem. The current episode started 6 to 12 hours ago. The problem occurs constantly. The problem has been gradually worsening. The headache is associated with nothing. The pain is located in the occipital and frontal region. The quality of the pain is described as dull. The pain is at a severity of 7/10. The pain is moderate. The pain does not radiate. Associated symptoms include nausea. Pertinent negatives include no anorexia, no fever, no malaise/fatigue, no chest pressure, no near-syncope, no orthopnea, no palpitations, no syncope, no shortness of breath and no vomiting. She has tried acetaminophen for the symptoms. The treatment provided no relief.    Past Medical History  Diagnosis Date  . Hypertension   . Reflux   . Right elbow pain     OTIF  . Dementia   . Depression   . Osteoarthritis   . Pancreatitis  11/2007    [facility]  . Angiomyolipoma of kidney     right  . Ulcerative esophagitis 12/05/2007    hx elevated gastrin, severe on EGD by Dr Jena Gauss , h pylori negative  . Hiatal hernia   . S/P colonoscopy 2009    pt reports normal by Dr Lovell Sheehan  . Diabetes mellitus   . Anemia     Past Surgical History  Procedure Date  . Orif right hip 1999    [facility]  . Umbilical hernia repair 64 months old    [place]  . Esophagogastroduodenoscopy 01/28/2011    Procedure: ESOPHAGOGASTRODUODENOSCOPY (EGD);  Surgeon: Arlyce Harman, MD;  [facility];  Service: Endoscopy;  Laterality: N/A;  . Colonoscopy 01/28/2011    Procedure: COLONOSCOPY;  Surgeon: Arlyce Harman, MD;  Location: [facility];  Service: Endoscopy;  Laterality: N/A;  . Laparotomy 02/04/2011    Procedure: EXPLORATORY LAPAROTOMY;  Surgeon: Fabio Bering;  Location: [facility];  Service: General;  Laterality: N/A;    Family History  Problem Relation Age of Onset  . Cancer Mother     pelvic     History  Substance Use Topics  . Smoking status: Never Smoker   . Smokeless tobacco: Current User    Types: Chew  . Alcohol Use: No     Hx of Alcohol dependecy     OB History    Grav Para Term Preterm Abortions TAB SAB Ect Mult Living  Review of Systems  Constitutional: Negative for fever, chills, malaise/fatigue, diaphoresis and appetite change.  HENT: Negative for hearing loss, ear pain, congestion, sore throat, facial swelling, rhinorrhea, trouble swallowing, neck pain, neck stiffness, dental problem, sinus pressure and tinnitus.   Eyes: Negative for photophobia, pain, discharge, redness and visual disturbance.  Respiratory: Negative.  Negative for shortness of breath.   Cardiovascular: Negative.  Negative for palpitations, orthopnea, syncope and near-syncope.  Gastrointestinal: Positive for nausea. Negative for vomiting and anorexia.  Genitourinary: Negative.   Musculoskeletal: Negative for back pain.  Skin:  Negative for rash.  Neurological: Positive for headaches. Negative for dizziness, seizures, syncope, light-headedness and numbness.  Psychiatric/Behavioral: Negative for confusion.    Allergies  Other and Penicillins  Home Medications   Current Outpatient Rx  Name Route Sig Dispense Refill  . ACETAMINOPHEN 500 MG PO TABS Oral Take 1 tablet (500 mg total) by mouth 3 (three) times daily as needed for pain. 60 tablet 0  . AMLODIPINE BESYLATE 5 MG PO TABS Oral Take 1 tablet (5 mg total) by mouth daily. 30 tablet 3  . BUTALBITAL-APAP-CAFFEINE 50-325-40 MG PO TABS  One tablet twice daily as needed for severe headache 40 tablet 2  . CALCIUM CARBONATE-VITAMIN D 500-200 MG-UNIT PO TABS Oral Take 1 tablet by mouth 2 (two) times daily.     . ENABLEX 15 MG PO TB24  TAKE 1 TABLET BY MOUTH ONCE A DAY FOR BLADDER. 30 each 3  . ENSURE PLUS PO LIQD Oral Take 1 Can by mouth daily. 30 Can 2  . ESOMEPRAZOLE MAGNESIUM 40 MG PO CPDR Oral Take 40 mg by mouth daily.     . OMEGA-3 FATTY ACIDS 1000 MG PO CAPS Oral Take 1 g by mouth daily. OTC      . GLIPIZIDE 10 MG PO TABS Oral Take 1 tablet (10 mg total) by mouth daily. DO NOT TAKE THIS MEDICINE IF YOUR BLOOD SUGAR IS LESS THAN 130. 30 tablet 3  . HYDROCODONE-ACETAMINOPHEN 5-325 MG PO TABS Oral Take 1-2 tablets by mouth every 6 (six) hours as needed for pain. 10 tablet 0  . HYDROCODONE-ACETAMINOPHEN 5-325 MG PO TABS Oral Take 1 tablet by mouth every 4 (four) hours as needed for pain. 20 tablet 0  . LOTENSIN 20 MG PO TABS  TAKE ONE TABLET DAILY. 30 each 3  . METFORMIN HCL 1000 MG PO TABS  TAKE ONE TABLET BY MOUTH TWICE DAILY WITH FOOD FORDIABETES. 60 tablet 3  . ONE-DAILY MULTI VITAMINS PO TABS Oral Take 1 tablet by mouth daily.      Marland Kitchen ONDANSETRON HCL 4 MG PO TABS Oral Take 1 tablet (4 mg total) by mouth every 6 (six) hours. 12 tablet 0  . POLYETHYLENE GLYCOL 3350 PO PACK  ADD TO WATER DAILY FOR CONSTIPATION. 14 each   . POTASSIUM CHLORIDE CRYS CR 20 MEQ PO TBCR  Oral Take 1 tablet (20 mEq total) by mouth daily. 30 tablet 1  . QUETIAPINE FUMARATE 25 MG PO TABS Oral Take 25 mg by mouth 2 (two) times daily as needed. FOR SLEEP        BP 119/65  Pulse 90  Temp(Src) 98.7 F (37.1 C) (Oral)  Resp 18  Ht 5' 6.5" (1.689 m)  Wt 162 lb (73.483 kg)  BMI 25.76 kg/m2  SpO2 100%  Physical Exam  Nursing note and vitals reviewed. Constitutional: She is oriented to person, place, and time. She appears well-developed and well-nourished. No distress.  HENT:  Head: Normocephalic and  atraumatic.  Right Ear: External ear normal.  Left Ear: External ear normal.  Mouth/Throat: Oropharynx is clear and moist. No oropharyngeal exudate.  Eyes: Conjunctivae and EOM are normal. Pupils are equal, round, and reactive to light. Right eye exhibits no nystagmus. Left eye exhibits no nystagmus.  Fundoscopic exam:      The right eye shows no papilledema.       The left eye shows no papilledema.  Neck: Normal range of motion, full passive range of motion without pain and phonation normal. Neck supple. Carotid bruit is not present.  Cardiovascular: Normal rate, regular rhythm, normal heart sounds and intact distal pulses.  Exam reveals no gallop and no friction rub.   No murmur heard. Pulmonary/Chest: Effort normal and breath sounds normal. No respiratory distress. She has no wheezes. She has no rales.  Abdominal: Soft. Bowel sounds are normal. She exhibits no distension. There is no tenderness. There is no rebound and no guarding.  Musculoskeletal: Normal range of motion. She exhibits no edema and no tenderness.  Neurological: She is alert and oriented to person, place, and time. She has normal reflexes. No cranial nerve deficit. She exhibits normal muscle tone. Coordination normal. GCS eye subscore is 4. GCS verbal subscore is 5. GCS motor subscore is 6.  Skin: Skin is warm and dry. No rash noted. She is not diaphoretic.  Psychiatric: She has a normal mood and affect.     ED Course  Procedures (including critical care time)  Labs Reviewed - No data to display No results found.   No diagnosis found.    MDM  The patient has symptoms consistent with tension headache, and a nonfocal neurologic examination. Furthermore she has had a CT scan that showed no acute focal neurologic pathology within the last week. Her headache was gradual in onset and I do not suspect subarachnoid hemorrhage. I do not suspect brain tumor. I believe this is a tension headache and it is now resolved after IV fluids, anti-medics, antihistamines, and nonsteroidal anti-inflammatory. I will discharge the patient home.        Felisa Bonier, MD 05/03/11 [phone]

## 2011-05-03 NOTE — ED Notes (Signed)
Pt c/o ha started at 1730 11/3 pain level at 8; abd pain started shortly after. Pt states leg stiffness however neuro assessment at baseline and WNL

## 2011-05-05 ENCOUNTER — Emergency Department (HOSPITAL_COMMUNITY)
Admission: EM | Admit: 2011-05-05 | Discharge: 2011-05-05 | Disposition: A | Discharge status: Home / Self Care | Payer: Medicare HMO | Source: Home / Self Care | Attending: Emergency Medicine | Admitting: Emergency Medicine

## 2011-05-05 ENCOUNTER — Encounter (HOSPITAL_COMMUNITY): Payer: Self-pay | Admitting: Emergency Medicine

## 2011-05-05 DIAGNOSIS — R109 Unspecified abdominal pain: Secondary | ICD-10-CM | POA: Insufficient documentation

## 2011-05-05 DIAGNOSIS — F329 Major depressive disorder, single episode, unspecified: Secondary | ICD-10-CM | POA: Insufficient documentation

## 2011-05-05 DIAGNOSIS — Z79899 Other long term (current) drug therapy: Secondary | ICD-10-CM | POA: Insufficient documentation

## 2011-05-05 DIAGNOSIS — F068 Other specified mental disorders due to known physiological condition: Secondary | ICD-10-CM | POA: Insufficient documentation

## 2011-05-05 DIAGNOSIS — R000 Tachycardia, unspecified: Secondary | ICD-10-CM | POA: Insufficient documentation

## 2011-05-05 DIAGNOSIS — F3289 Other specified depressive episodes: Secondary | ICD-10-CM | POA: Insufficient documentation

## 2011-05-05 DIAGNOSIS — R112 Nausea with vomiting, unspecified: Secondary | ICD-10-CM | POA: Insufficient documentation

## 2011-05-05 DIAGNOSIS — R51 Headache: Secondary | ICD-10-CM | POA: Insufficient documentation

## 2011-05-05 DIAGNOSIS — E119 Type 2 diabetes mellitus without complications: Secondary | ICD-10-CM | POA: Insufficient documentation

## 2011-05-05 DIAGNOSIS — M1990 Unspecified osteoarthritis, unspecified site: Secondary | ICD-10-CM | POA: Insufficient documentation

## 2011-05-05 DIAGNOSIS — I10 Essential (primary) hypertension: Secondary | ICD-10-CM | POA: Insufficient documentation

## 2011-05-05 DIAGNOSIS — K219 Gastro-esophageal reflux disease without esophagitis: Secondary | ICD-10-CM | POA: Insufficient documentation

## 2011-05-05 DIAGNOSIS — E860 Dehydration: Secondary | ICD-10-CM

## 2011-05-05 DIAGNOSIS — E871 Hypo-osmolality and hyponatremia: Secondary | ICD-10-CM

## 2011-05-05 LAB — COMPREHENSIVE METABOLIC PANEL
ALT: 46 U/L — ABNORMAL HIGH (ref 0–35)
Alkaline Phosphatase: 89 U/L (ref 39–117)
BUN: 8 mg/dL (ref 6–23)
CO2: 16 mEq/L — ABNORMAL LOW (ref 19–32)
GFR calc Af Amer: >90 mL/min (ref >90)
GFR calc non Af Amer: 84 mL/min — ABNORMAL LOW (ref >90)
Glucose, Bld: 140 mg/dL — ABNORMAL HIGH (ref 70–99)
Potassium: 4.2 mEq/L (ref 3.5–5.1)
Sodium: 125 mEq/L — ABNORMAL LOW (ref 135–145)
Total Protein: 6.4 g/dL (ref 6.0–8.3)

## 2011-05-05 LAB — CBC
Hemoglobin: 10.7 g/dL — ABNORMAL LOW (ref 12.0–15.0)
MCH: 26.9 pg (ref 26.0–34.0)
MCV: 78.9 fL (ref 78.0–100.0)
RBC: 3.98 MIL/uL (ref 3.87–5.11)
WBC: 6.9 10*3/uL (ref 4.0–10.5)

## 2011-05-05 LAB — URINALYSIS, ROUTINE W REFLEX MICROSCOPIC
Bilirubin Urine: NEGATIVE
Hgb urine dipstick: NEGATIVE
Specific Gravity, Urine: 1.03 (ref 1.005–1.030)
pH: 5 (ref 5.0–8.0)

## 2011-05-05 LAB — DIFFERENTIAL
Eosinophils Absolute: 0 10*3/uL (ref 0.0–0.7)
Eosinophils Relative: 0 % (ref 0–5)
Lymphocytes Relative: 20 % (ref 12–46)
Lymphs Abs: 1.4 10*3/uL (ref 0.7–4.0)
Monocytes Relative: 6 % (ref 3–12)
Neutrophils Relative %: 74 % (ref 43–77)

## 2011-05-05 LAB — LACTIC ACID, PLASMA: Lactic Acid, Venous: 1.1 mmol/L (ref 0.5–2.2)

## 2011-05-05 MED ORDER — SODIUM CHLORIDE 0.9 % IV BOLUS (SEPSIS)
500.0000 mL | INTRAVENOUS | Status: AC
  Administered 2011-05-05: 06:00:00 via INTRAVENOUS

## 2011-05-05 MED ORDER — ONDANSETRON 4 MG PO TBDP
4.0000 mg | ORAL_TABLET | Freq: Once | ORAL | Status: AC
  Administered 2011-05-05: 4 mg via ORAL
  Filled 2011-05-05: qty 1

## 2011-05-05 MED ORDER — KETOROLAC TROMETHAMINE 30 MG/ML IJ SOLN
30.0000 mg | Freq: Once | INTRAMUSCULAR | Status: AC
  Administered 2011-05-05: 30 mg via INTRAVENOUS
  Filled 2011-05-05: qty 1

## 2011-05-05 MED ORDER — ONDANSETRON 4 MG PO TBDP: 4.0000 mg | ORAL_TABLET | Freq: Three times a day (TID) | ORAL | Status: DC | PRN

## 2011-05-05 NOTE — ED Notes (Signed)
Patient had foley catheter placed. Urine return was good. No issues upon insertion.

## 2011-05-05 NOTE — ED Notes (Signed)
Patient c/o headache since 1730.  Patient ambulatory.

## 2011-05-05 NOTE — ED Provider Notes (Signed)
History     CSN: [id number] Arrival date & time: 05/05/2011  4:45 AM   First MD Initiated Contact with Patient 05/05/11 0458      Chief Complaint  Patient presents with  . Headache    (Consider location/radiation/quality/duration/timing/severity/associated sxs/prior treatment) HPI Comments: 77 year old female with a history of headaches for which she has been seen multiple times in the ED this last 30 days.  She states that the HA is frontal, dull and throbbing and started 12 hours pta - she states that the intensity waxes and wanes and currently it is mild.  Today it has been associated with nausea and vomiting X 3 per pt - she has also had one episode of loose stools.  She had apap pta without improvement.  She had CT scan of the brain on 10/28 which showed no significant findings.  She denies fevers, stiff neck, cough, sob, dysuria, rash or other c/o.  Patient is a 77 y.o. female presenting with headaches. The history is provided by the patient, the EMS personnel and medical records.  Headache  This is a recurrent problem.    Past Medical History  Diagnosis Date  . Hypertension   . Reflux   . Right elbow pain     OTIF  . Dementia   . Depression   . Osteoarthritis   . Pancreatitis 11/2007    [facility]  . Angiomyolipoma of kidney     right  . Ulcerative esophagitis 12/05/2007    hx elevated gastrin, severe on EGD by Dr Jena Gauss , h pylori negative  . Hiatal hernia   . S/P colonoscopy 2009    pt reports normal by Dr Lovell Sheehan  . Diabetes mellitus   . Anemia     Past Surgical History  Procedure Date  . Orif right hip 1999    [facility]  . Umbilical hernia repair 75 months old    [place]  . Esophagogastroduodenoscopy 01/28/2011    Procedure: ESOPHAGOGASTRODUODENOSCOPY (EGD);  Surgeon: Arlyce Harman, MD;  [facility]: [facility];  Service: Endoscopy;  Laterality: N/A;  . Colonoscopy 01/28/2011    Procedure: COLONOSCOPY;  Surgeon: Arlyce Harman, MD;  Location: [facility];  Service:  Endoscopy;  Laterality: N/A;  . Laparotomy 02/04/2011    Procedure: EXPLORATORY LAPAROTOMY;  Surgeon: Fabio Bering;  Location: [facility];  Service: General;  Laterality: N/A;    Family History  Problem Relation Age of Onset  . Cancer Mother     pelvic     History  Substance Use Topics  . Smoking status: Never Smoker   . Smokeless tobacco: Current User    Types: Chew  . Alcohol Use: No     Hx of Alcohol dependecy     OB History    Grav Para Term Preterm Abortions TAB SAB Ect Mult Living                  Review of Systems  Neurological: Positive for headaches.  All other systems reviewed and are negative.    Allergies  Other and Penicillins  Home Medications   Current Outpatient Rx  Name Route Sig Dispense Refill  . ACETAMINOPHEN 500 MG PO TABS Oral Take 1 tablet (500 mg total) by mouth 3 (three) times daily as needed for pain. 60 tablet 0  . AMLODIPINE BESYLATE 5 MG PO TABS Oral Take 1 tablet (5 mg total) by mouth daily. 30 tablet 3  . BUTALBITAL-APAP-CAFFEINE 50-325-40 MG PO TABS  One tablet twice daily  as needed for severe headache 40 tablet 2  . BUTALBITAL-APAP-CAFFEINE 50-325-40 MG PO TABS Oral Take 1-2 tablets by mouth every 8 (eight) hours as needed for headache. 20 tablet 0  . CALCIUM CARBONATE-VITAMIN D 500-200 MG-UNIT PO TABS Oral Take 1 tablet by mouth 2 (two) times daily.     . ENABLEX 15 MG PO TB24  TAKE 1 TABLET BY MOUTH ONCE A DAY FOR BLADDER. 30 each 3  . ENSURE PLUS PO LIQD Oral Take 1 Can by mouth daily. 30 Can 2  . ESOMEPRAZOLE MAGNESIUM 40 MG PO CPDR Oral Take 40 mg by mouth daily.     . OMEGA-3 FATTY ACIDS 1000 MG PO CAPS Oral Take 1 g by mouth daily. OTC      . GLIPIZIDE 10 MG PO TABS Oral Take 1 tablet (10 mg total) by mouth daily. DO NOT TAKE THIS MEDICINE IF YOUR BLOOD SUGAR IS LESS THAN 130. 30 tablet 3  . HYDROCODONE-ACETAMINOPHEN 5-325 MG PO TABS Oral Take 1-2 tablets by mouth every 6 (six) hours as needed for pain. 10 tablet 0  .  HYDROCODONE-ACETAMINOPHEN 5-325 MG PO TABS Oral Take 1 tablet by mouth every 4 (four) hours as needed for pain. 20 tablet 0  . LOTENSIN 20 MG PO TABS  TAKE ONE TABLET DAILY. 30 each 3  . METFORMIN HCL 1000 MG PO TABS  TAKE ONE TABLET BY MOUTH TWICE DAILY WITH FOOD FORDIABETES. 60 tablet 3  . ONE-DAILY MULTI VITAMINS PO TABS Oral Take 1 tablet by mouth daily.      Marland Kitchen ONDANSETRON 4 MG PO TBDP Oral Take 1 tablet (4 mg total) by mouth every 8 (eight) hours as needed for nausea. 10 tablet 0  . ONDANSETRON HCL 4 MG PO TABS Oral Take 1 tablet (4 mg total) by mouth every 6 (six) hours. 12 tablet 0  . POLYETHYLENE GLYCOL 3350 PO PACK  ADD TO WATER DAILY FOR CONSTIPATION. 14 each   . POTASSIUM CHLORIDE CRYS CR 20 MEQ PO TBCR Oral Take 1 tablet (20 mEq total) by mouth daily. 30 tablet 1  . QUETIAPINE FUMARATE 25 MG PO TABS Oral Take 25 mg by mouth 2 (two) times daily as needed. FOR SLEEP        BP 133/77  Pulse 111  Temp(Src) 98.6 F (37 C) (Oral)  Resp 20  Ht 5' 6.5" (1.689 m)  Wt 162 lb (73.483 kg)  BMI 25.76 kg/m2  SpO2 97%  Physical Exam  Nursing note and vitals reviewed. Constitutional: She appears well-developed and well-nourished. No distress.  HENT:  Head: Normocephalic and atraumatic.       MM dehydrated  Eyes: Conjunctivae and EOM are normal. Pupils are equal, round, and reactive to light. Right eye exhibits no discharge. Left eye exhibits no discharge. No scleral icterus.  Neck: Normal range of motion. Neck supple. No JVD present. No thyromegaly present.  Cardiovascular: Regular rhythm, normal heart sounds and intact distal pulses.  Exam reveals no gallop and no friction rub.   No murmur heard.      Tachycardia of 105  Pulmonary/Chest: Effort normal and breath sounds normal. No respiratory distress. She has no wheezes. She has no rales.  Abdominal: Soft. Bowel sounds are normal. She exhibits no distension and no mass. There is tenderness ( pt has mild diffuse ttp, no guarding and no  wincing in pain.  She has no pain in the RLQ.  ).  Musculoskeletal: Normal range of motion. She exhibits no edema and no  tenderness.  Lymphadenopathy:    She has no cervical adenopathy.  Neurological: She is alert. Coordination normal.       Pt able to move all extremities X 4, with normal strength other than her RUE which has slight weakness - she states this is baseline.  Skin: Skin is warm and dry. No rash noted. No erythema.  Psychiatric: She has a normal mood and affect. Her behavior is normal.    ED Course  Procedures (including critical care time)  Labs Reviewed  CBC - Abnormal; Notable for the following:    Hemoglobin 10.7 (*)    HCT 31.4 (*)    RDW 20.2 (*)    Platelets 422 (*)    All other components within normal limits  COMPREHENSIVE METABOLIC PANEL - Abnormal; Notable for the following:    Sodium 125 (*)    CO2 16 (*)    Glucose, Bld 140 (*)    Albumin 2.9 (*)    AST 38 (*)    ALT 46 (*)    GFR calc non Af Amer 84 (*)    All other components within normal limits  URINALYSIS, ROUTINE W REFLEX MICROSCOPIC  DIFFERENTIAL  LACTIC ACID, PLASMA   No results found.   1. Headache   2. Hyponatremia   3. Dehydration       MDM  Headache seems to be a chronic problem for the patient. She has no focal neurologic deficits that are new and states that her right upper chest and weakness is a chronic finding. She is right handed and uses her hand to write, fever self-induced rest and has no difficulty with this. She has no signs of sinusitis, stiff neck or altered mental status to suggest meningitis. She has no fever by vital signs however she does appear to be dehydrated and will require some fluid resuscitation. Her abdominal pain will be evaluated with urinalysis, blood tests, lactic acid.      Results for orders placed during the hospital encounter of 05/05/11  URINALYSIS, ROUTINE W REFLEX MICROSCOPIC      Component Value Range   Color, Urine YELLOW  YELLOW     Appearance CLEAR  CLEAR    Specific Gravity, Urine 1.030  1.005 - 1.030    pH 5.0  5.0 - 8.0    Glucose, UA NEGATIVE  NEGATIVE (mg/dL)   Hgb urine dipstick NEGATIVE  NEGATIVE    Bilirubin Urine NEGATIVE  NEGATIVE    Ketones, ur NEGATIVE  NEGATIVE (mg/dL)   Protein, ur NEGATIVE  NEGATIVE (mg/dL)   Urobilinogen, UA 0.2  0.0 - 1.0 (mg/dL)   Nitrite NEGATIVE  NEGATIVE    Leukocytes, UA NEGATIVE  NEGATIVE   CBC      Component Value Range   WBC 6.9  4.0 - 10.5 (K/uL)   RBC 3.98  3.87 - 5.11 (MIL/uL)   Hemoglobin 10.7 (*) 12.0 - 15.0 (g/dL)   HCT 16.1 (*) 09.6 - 46.0 (%)   MCV 78.9  78.0 - 100.0 (fL)   MCH 26.9  26.0 - 34.0 (pg)   MCHC 34.1  30.0 - 36.0 (g/dL)   RDW 04.5 (*) 40.9 - 15.5 (%)   Platelets 422 (*) 150 - 400 (K/uL)  DIFFERENTIAL      Component Value Range   Neutrophils Relative 74  43 - 77 (%)   Neutro Abs 5.1  1.7 - 7.7 (K/uL)   Lymphocytes Relative 20  12 - 46 (%)   Lymphs Abs 1.4  0.7 -  4.0 (K/uL)   Monocytes Relative 6  3 - 12 (%)   Monocytes Absolute 0.4  0.1 - 1.0 (K/uL)   Eosinophils Relative 0  0 - 5 (%)   Eosinophils Absolute 0.0  0.0 - 0.7 (K/uL)   Basophils Relative 0  0 - 1 (%)   Basophils Absolute 0.0  0.0 - 0.1 (K/uL)  COMPREHENSIVE METABOLIC PANEL      Component Value Range   Sodium 125 (*) 135 - 145 (mEq/L)   Potassium 4.2  3.5 - 5.1 (mEq/L)   Chloride 98  96 - 112 (mEq/L)   CO2 16 (*) 19 - 32 (mEq/L)   Glucose, Bld 140 (*) 70 - 99 (mg/dL)   BUN 8  6 - 23 (mg/dL)   Creatinine, Ser 4.09  0.50 - 1.10 (mg/dL)   Calcium 8.7  8.4 - 81.1 (mg/dL)   Total Protein 6.4  6.0 - 8.3 (g/dL)   Albumin 2.9 (*) 3.5 - 5.2 (g/dL)   AST 38 (*) 0 - 37 (U/L)   ALT 46 (*) 0 - 35 (U/L)   Alkaline Phosphatase 89  39 - 117 (U/L)   Total Bilirubin 0.3  0.3 - 1.2 (mg/dL)   GFR calc non Af Amer 84 (*) >90 (mL/min)   GFR calc Af Amer >90  >90 (mL/min)  LACTIC ACID, PLASMA      Component Value Range   Lactic Acid, Venous 1.1  0.5 - 2.2 (mmol/L)    Ct Head Wo  Contrast  04/26/2011  *RADIOLOGY REPORT*  Clinical Data: Left frontal headache, fall  CT HEAD WITHOUT CONTRAST  Technique:  Contiguous axial images were obtained from the base of the skull through the vertex without contrast.  Comparison: 01/25/2011  Findings: No evidence of parenchymal hemorrhage or extra-axial fluid collection. No mass lesion, mass effect, or midline shift.  No CT evidence of acute infarction.  Subcortical white matter and periventricular small vessel ischemic changes.  Intracranial atherosclerosis.  Cortical and cerebellar atrophy.  No ventriculomegaly.  The visualized paranasal sinuses are essentially clear. The mastoid air cells are unopacified.  No evidence of calvarial fracture.  IMPRESSION: No evidence of acute intracranial abnormality.  Atrophy with small vessel ischemic changes and intracranial atherosclerosis.  Original Report Authenticated By: Charline Bills, M.D.   Pt has tolerated PO, states wants to eat breakfast - tray ordered, ha gone, nausea resolved - informed of Na level - will have rechecked by PMD in 3 days.  Labs reviewed above, lactic acid normal, sodium low but has been low similar to this over the last year.   Vida Roller, MD 05/05/11 [phone]

## 2011-05-05 NOTE — ED Notes (Signed)
Upon d/c the IV, pt still had tourniquet tied around her rt arm.  Pt denies any pain to the area or numbness.  Notified edp.

## 2011-05-06 ENCOUNTER — Encounter (HOSPITAL_COMMUNITY): Payer: Self-pay | Admitting: Emergency Medicine

## 2011-05-06 ENCOUNTER — Inpatient Hospital Stay (HOSPITAL_COMMUNITY)
Admission: EM | Admit: 2011-05-06 | Discharge: 2011-07-06 | DRG: 853 | Disposition: A | Discharge status: Skilled Nursing Facility | Payer: Medicare HMO | Attending: Family Medicine | Admitting: Family Medicine

## 2011-05-06 ENCOUNTER — Emergency Department (HOSPITAL_COMMUNITY)
Admission: EM | Admit: 2011-05-06 | Discharge: 2011-05-06 | Disposition: A | Discharge status: Home / Self Care | Payer: Medicare HMO | Source: Home / Self Care | Attending: Emergency Medicine | Admitting: Emergency Medicine

## 2011-05-06 ENCOUNTER — Telehealth: Payer: Self-pay | Admitting: Family Medicine

## 2011-05-06 DIAGNOSIS — T8183XA Persistent postprocedural fistula, initial encounter: Secondary | ICD-10-CM | POA: Diagnosis not present

## 2011-05-06 DIAGNOSIS — Y832 Surgical operation with anastomosis, bypass or graft as the cause of abnormal reaction of the patient, or of later complication, without mention of misadventure at the time of the procedure: Secondary | ICD-10-CM | POA: Diagnosis not present

## 2011-05-06 DIAGNOSIS — J690 Pneumonitis due to inhalation of food and vomit: Secondary | ICD-10-CM | POA: Diagnosis not present

## 2011-05-06 DIAGNOSIS — I10 Essential (primary) hypertension: Secondary | ICD-10-CM | POA: Diagnosis present

## 2011-05-06 DIAGNOSIS — Z79899 Other long term (current) drug therapy: Secondary | ICD-10-CM | POA: Insufficient documentation

## 2011-05-06 DIAGNOSIS — E872 Acidosis, unspecified: Secondary | ICD-10-CM | POA: Diagnosis present

## 2011-05-06 DIAGNOSIS — K255 Chronic or unspecified gastric ulcer with perforation: Secondary | ICD-10-CM | POA: Diagnosis present

## 2011-05-06 DIAGNOSIS — K219 Gastro-esophageal reflux disease without esophagitis: Secondary | ICD-10-CM | POA: Diagnosis present

## 2011-05-06 DIAGNOSIS — K651 Peritoneal abscess: Secondary | ICD-10-CM | POA: Diagnosis present

## 2011-05-06 DIAGNOSIS — R109 Unspecified abdominal pain: Secondary | ICD-10-CM | POA: Diagnosis present

## 2011-05-06 DIAGNOSIS — E876 Hypokalemia: Secondary | ICD-10-CM | POA: Diagnosis not present

## 2011-05-06 DIAGNOSIS — E119 Type 2 diabetes mellitus without complications: Secondary | ICD-10-CM | POA: Insufficient documentation

## 2011-05-06 DIAGNOSIS — R6520 Severe sepsis without septic shock: Secondary | ICD-10-CM | POA: Diagnosis present

## 2011-05-06 DIAGNOSIS — D3000 Benign neoplasm of unspecified kidney: Secondary | ICD-10-CM | POA: Diagnosis present

## 2011-05-06 DIAGNOSIS — I4891 Unspecified atrial fibrillation: Secondary | ICD-10-CM | POA: Diagnosis not present

## 2011-05-06 DIAGNOSIS — E43 Unspecified severe protein-calorie malnutrition: Secondary | ICD-10-CM | POA: Diagnosis not present

## 2011-05-06 DIAGNOSIS — R51 Headache: Secondary | ICD-10-CM | POA: Insufficient documentation

## 2011-05-06 DIAGNOSIS — K859 Acute pancreatitis without necrosis or infection, unspecified: Secondary | ICD-10-CM

## 2011-05-06 DIAGNOSIS — M1990 Unspecified osteoarthritis, unspecified site: Secondary | ICD-10-CM | POA: Diagnosis present

## 2011-05-06 DIAGNOSIS — F0390 Unspecified dementia without behavioral disturbance: Secondary | ICD-10-CM | POA: Diagnosis present

## 2011-05-06 DIAGNOSIS — K209 Esophagitis, unspecified without bleeding: Secondary | ICD-10-CM | POA: Diagnosis present

## 2011-05-06 DIAGNOSIS — K2210 Ulcer of esophagus without bleeding: Secondary | ICD-10-CM | POA: Diagnosis present

## 2011-05-06 DIAGNOSIS — K275 Chronic or unspecified peptic ulcer, site unspecified, with perforation: Secondary | ICD-10-CM

## 2011-05-06 DIAGNOSIS — E870 Hyperosmolality and hypernatremia: Secondary | ICD-10-CM | POA: Diagnosis not present

## 2011-05-06 DIAGNOSIS — D649 Anemia, unspecified: Secondary | ICD-10-CM | POA: Diagnosis present

## 2011-05-06 DIAGNOSIS — E878 Other disorders of electrolyte and fluid balance, not elsewhere classified: Secondary | ICD-10-CM

## 2011-05-06 DIAGNOSIS — A498 Other bacterial infections of unspecified site: Secondary | ICD-10-CM | POA: Diagnosis not present

## 2011-05-06 DIAGNOSIS — J9600 Acute respiratory failure, unspecified whether with hypoxia or hypercapnia: Secondary | ICD-10-CM | POA: Diagnosis not present

## 2011-05-06 DIAGNOSIS — R5381 Other malaise: Secondary | ICD-10-CM | POA: Diagnosis not present

## 2011-05-06 DIAGNOSIS — N170 Acute kidney failure with tubular necrosis: Secondary | ICD-10-CM | POA: Diagnosis not present

## 2011-05-06 DIAGNOSIS — N390 Urinary tract infection, site not specified: Secondary | ICD-10-CM | POA: Diagnosis not present

## 2011-05-06 DIAGNOSIS — E871 Hypo-osmolality and hyponatremia: Secondary | ICD-10-CM | POA: Diagnosis present

## 2011-05-06 DIAGNOSIS — K660 Peritoneal adhesions (postprocedural) (postinfection): Secondary | ICD-10-CM | POA: Diagnosis present

## 2011-05-06 DIAGNOSIS — B3749 Other urogenital candidiasis: Secondary | ICD-10-CM | POA: Diagnosis not present

## 2011-05-06 DIAGNOSIS — F3289 Other specified depressive episodes: Secondary | ICD-10-CM | POA: Diagnosis present

## 2011-05-06 DIAGNOSIS — E785 Hyperlipidemia, unspecified: Secondary | ICD-10-CM | POA: Diagnosis present

## 2011-05-06 DIAGNOSIS — E860 Dehydration: Secondary | ICD-10-CM

## 2011-05-06 DIAGNOSIS — F329 Major depressive disorder, single episode, unspecified: Secondary | ICD-10-CM | POA: Diagnosis present

## 2011-05-06 DIAGNOSIS — A419 Sepsis, unspecified organism: Principal | ICD-10-CM | POA: Diagnosis present

## 2011-05-06 DIAGNOSIS — Y9210 Unspecified residential institution as the place of occurrence of the external cause: Secondary | ICD-10-CM | POA: Diagnosis not present

## 2011-05-06 DIAGNOSIS — K912 Postsurgical malabsorption, not elsewhere classified: Secondary | ICD-10-CM | POA: Diagnosis not present

## 2011-05-06 HISTORY — DX: Hypo-osmolality and hyponatremia: E87.1

## 2011-05-06 LAB — POCT I-STAT, CHEM 8
Calcium, Ion: 1.05 mmol/L — ABNORMAL LOW (ref 1.12–1.32)
Chloride: 95 mEq/L — ABNORMAL LOW (ref 96–112)
Glucose, Bld: 225 mg/dL — ABNORMAL HIGH (ref 70–99)
HCT: 40 % (ref 36.0–46.0)
Hemoglobin: 13.6 g/dL (ref 12.0–15.0)
TCO2: 14 mmol/L (ref 0–100)

## 2011-05-06 LAB — POCT I-STAT TROPONIN I: Troponin i, poc: 0 ng/mL (ref 0.00–0.08)

## 2011-05-06 MED ORDER — SODIUM CHLORIDE 0.9 % IV SOLN
Freq: Once | INTRAVENOUS | Status: AC
  Administered 2011-05-06: 500 mL via INTRAVENOUS

## 2011-05-06 MED ORDER — SODIUM CHLORIDE 0.9 % IV SOLN
Freq: Once | INTRAVENOUS | Status: AC
  Administered 2011-05-06: via INTRAVENOUS

## 2011-05-06 MED ORDER — ONDANSETRON HCL 4 MG/2ML IJ SOLN
4.0000 mg | Freq: Once | INTRAMUSCULAR | Status: AC
  Administered 2011-05-06: 4 mg via INTRAVENOUS
  Filled 2011-05-06: qty 2

## 2011-05-06 MED ORDER — OXYCODONE-ACETAMINOPHEN 5-325 MG PO TABS
1.0000 | ORAL_TABLET | Freq: Once | ORAL | Status: AC
  Administered 2011-05-06: 1 via ORAL
  Filled 2011-05-06: qty 1

## 2011-05-06 MED ORDER — DEXAMETHASONE SODIUM PHOSPHATE 4 MG/ML IJ SOLN
4.0000 mg | Freq: Once | INTRAMUSCULAR | Status: AC
  Administered 2011-05-06: 4 mg via INTRAVENOUS

## 2011-05-06 MED ORDER — MORPHINE SULFATE 4 MG/ML IJ SOLN
4.0000 mg | Freq: Once | INTRAMUSCULAR | Status: AC
  Administered 2011-05-06: 4 mg via INTRAVENOUS
  Filled 2011-05-06: qty 1

## 2011-05-06 MED ORDER — DEXAMETHASONE SODIUM PHOSPHATE 4 MG/ML IJ SOLN
INTRAMUSCULAR | Status: AC
  Administered 2011-05-06: 4 mg via INTRAVENOUS
  Filled 2011-05-06: qty 1

## 2011-05-06 MED ORDER — KETOROLAC TROMETHAMINE 60 MG/2ML IM SOLN
60.0000 mg | Freq: Once | INTRAMUSCULAR | Status: AC
  Administered 2011-05-06: 60 mg via INTRAMUSCULAR
  Filled 2011-05-06: qty 2

## 2011-05-06 NOTE — ED Notes (Signed)
Patient presents to ER via EMS with c/o headache.  Patient has been seen x2 for same in the past 2 days.

## 2011-05-06 NOTE — ED Notes (Signed)
Patients temperature was re checked in the room after getting a gown and blankets on temperature is 94.2. Patient was placed under about 6 thick blankets to try and warm her up. Patient is cold to touch and breath feels cool to touch.

## 2011-05-06 NOTE — ED Notes (Signed)
Patient c/o headache started yesterday. Patient was treated for same here yesterday.

## 2011-05-06 NOTE — ED Notes (Signed)
Patients blood sugar was checked and reading 307 mg/dL

## 2011-05-06 NOTE — ED Provider Notes (Signed)
History     CSN: [id number] Arrival date & time: 05/06/2011  4:44 AM   First MD Initiated Contact with Patient 05/06/11 0456      Chief Complaint  Patient presents with  . Headache    (Consider location/radiation/quality/duration/timing/severity/associated sxs/prior treatment) Patient is a 77 y.o. female presenting with headaches.  Headache  This is a recurrent problem. The current episode started 3 to 5 hours ago. The problem occurs constantly. The problem has not changed since onset.The headache is associated with nothing. The pain is located in the frontal region. The quality of the pain is described as throbbing. The pain is at a severity of 8/10. The pain is severe. The pain does not radiate. Pertinent negatives include no anorexia, no fever, no malaise/fatigue, no chest pressure, no near-syncope, no orthopnea, no palpitations, no syncope, no shortness of breath, no nausea and no vomiting. She has tried acetaminophen for the symptoms. The treatment provided no relief.  Patient seen 24 hours ago for same and has been seen multiple times in the past month for same as well.  HA is not sudden onset, not the worst HA of her life.  No tractional symptoms.  No focal neurologic deficits.  No changes in vision.  No change in cognition.  No f/c/r.  No neck pain or stiffness.  No instrumentation.  No sick contacts.  Negative head CT on 10/28 for same pain.  Past Medical History  Diagnosis Date  . Hypertension   . Reflux   . Right elbow pain     OTIF  . Dementia   . Depression   . Osteoarthritis   . Pancreatitis 11/2007    [facility]  . Angiomyolipoma of kidney     right  . Ulcerative esophagitis 12/05/2007    hx elevated gastrin, severe on EGD by Dr Jena Gauss , h pylori negative  . Hiatal hernia   . S/P colonoscopy 2009    pt reports normal by Dr Lovell Sheehan  . Diabetes mellitus   . Anemia     Past Surgical History  Procedure Date  . Orif right hip 1999    [facility]  . Umbilical hernia repair 58  months old    [place]  . Esophagogastroduodenoscopy 01/28/2011    Procedure: ESOPHAGOGASTRODUODENOSCOPY (EGD);  Surgeon: Arlyce Harman, MD;  [facility];  Service: Endoscopy;  Laterality: N/A;  . Colonoscopy 01/28/2011    Procedure: COLONOSCOPY;  Surgeon: Arlyce Harman, MD;  [facility];  Service: Endoscopy;  Laterality: N/A;  . Laparotomy 02/04/2011    Procedure: EXPLORATORY LAPAROTOMY;  Surgeon: Fabio Bering;  Location: [facility];  Service: General;  Laterality: N/A;    Family History  Problem Relation Age of Onset  . Cancer Mother     pelvic     History  Substance Use Topics  . Smoking status: Never Smoker   . Smokeless tobacco: Current User    Types: Chew  . Alcohol Use: No     Hx of Alcohol dependecy     OB History    Grav Para Term Preterm Abortions TAB SAB Ect Mult Living                  Review of Systems  Constitutional: Negative for fever, chills and malaise/fatigue.  HENT: Negative for ear pain, congestion, facial swelling, neck pain, neck stiffness and tinnitus.   Eyes: Negative for photophobia, discharge and visual disturbance.  Respiratory: Negative for shortness of breath.   Cardiovascular: Negative for  chest pain, palpitations, orthopnea, syncope and near-syncope.  Gastrointestinal: Negative for nausea, vomiting, abdominal distention and anorexia.  Genitourinary: Negative for dysuria and difficulty urinating.  Musculoskeletal: Negative for arthralgias.  Neurological: Positive for headaches. Negative for dizziness, seizures, syncope, facial asymmetry, speech difficulty, weakness, light-headedness and numbness.  Hematological: Negative.   Psychiatric/Behavioral: Negative.     Allergies  Other and Penicillins  Home Medications   Current Outpatient Rx  Name Route Sig Dispense Refill  . ACETAMINOPHEN 500 MG PO TABS Oral Take 1 tablet (500 mg total) by mouth 3 (three) times daily as needed for pain. 60 tablet 0  . AMLODIPINE  BESYLATE 5 MG PO TABS Oral Take 1 tablet (5 mg total) by mouth daily. 30 tablet 3  . BUTALBITAL-APAP-CAFFEINE 50-325-40 MG PO TABS  One tablet twice daily as needed for severe headache 40 tablet 2  . BUTALBITAL-APAP-CAFFEINE 50-325-40 MG PO TABS Oral Take 1-2 tablets by mouth every 8 (eight) hours as needed for headache. 20 tablet 0  . CALCIUM CARBONATE-VITAMIN D 500-200 MG-UNIT PO TABS Oral Take 1 tablet by mouth 2 (two) times daily.     . ENABLEX 15 MG PO TB24  TAKE 1 TABLET BY MOUTH ONCE A DAY FOR BLADDER. 30 each 3  . ENSURE PLUS PO LIQD Oral Take 1 Can by mouth daily. 30 Can 2  . ESOMEPRAZOLE MAGNESIUM 40 MG PO CPDR Oral Take 40 mg by mouth daily.     . OMEGA-3 FATTY ACIDS 1000 MG PO CAPS Oral Take 1 g by mouth daily. OTC      . GLIPIZIDE 10 MG PO TABS Oral Take 1 tablet (10 mg total) by mouth daily. DO NOT TAKE THIS MEDICINE IF YOUR BLOOD SUGAR IS LESS THAN 130. 30 tablet 3  . HYDROCODONE-ACETAMINOPHEN 5-325 MG PO TABS Oral Take 1-2 tablets by mouth every 6 (six) hours as needed for pain. 10 tablet 0  . HYDROCODONE-ACETAMINOPHEN 5-325 MG PO TABS Oral Take 1 tablet by mouth every 4 (four) hours as needed for pain. 20 tablet 0  . LOTENSIN 20 MG PO TABS  TAKE ONE TABLET DAILY. 30 each 3  . METFORMIN HCL 1000 MG PO TABS  TAKE ONE TABLET BY MOUTH TWICE DAILY WITH FOOD FORDIABETES. 60 tablet 3  . ONE-DAILY MULTI VITAMINS PO TABS Oral Take 1 tablet by mouth daily.      Marland Kitchen ONDANSETRON 4 MG PO TBDP Oral Take 1 tablet (4 mg total) by mouth every 8 (eight) hours as needed for nausea. 10 tablet 0  . ONDANSETRON HCL 4 MG PO TABS Oral Take 1 tablet (4 mg total) by mouth every 6 (six) hours. 12 tablet 0  . POLYETHYLENE GLYCOL 3350 PO PACK  ADD TO WATER DAILY FOR CONSTIPATION. 14 each   . POTASSIUM CHLORIDE CRYS CR 20 MEQ PO TBCR Oral Take 1 tablet (20 mEq total) by mouth daily. 30 tablet 1  . QUETIAPINE FUMARATE 25 MG PO TABS Oral Take 25 mg by mouth 2 (two) times daily as needed. FOR SLEEP        BP  156/81  Pulse 127  Temp(Src) 98.3 F (36.8 C) (Oral)  Resp 22  Ht 5\' 6"  (1.676 m)  Wt 162 lb (73.483 kg)  BMI 26.15 kg/m2  SpO2 98%  Physical Exam  Constitutional: She is oriented to person, place, and time. She appears well-developed and well-nourished. No distress.  HENT:  Head: Normocephalic and atraumatic.  Right Ear: External ear normal.  Left Ear: External ear normal.  Mouth/Throat: Oropharynx is clear and moist. No oropharyngeal exudate.  Eyes: EOM are normal. Pupils are equal, round, and reactive to light. Right eye exhibits no discharge. Left eye exhibits no discharge.  Neck: Normal range of motion. Neck supple. No JVD present. No tracheal deviation present.       No meningeal signs  Cardiovascular: Normal rate and regular rhythm.  Exam reveals no friction rub.   Pulmonary/Chest: Effort normal and breath sounds normal. No respiratory distress. She has no wheezes.  Abdominal: Soft. Bowel sounds are normal. She exhibits no mass. There is no tenderness. There is no guarding.  Musculoskeletal: Normal range of motion. She exhibits no edema and no tenderness.  Lymphadenopathy:    She has no cervical adenopathy.  Neurological: She is alert and oriented to person, place, and time. She has normal reflexes. No cranial nerve deficit.  Skin: Skin is warm and dry. No rash noted.  Psychiatric: She has a normal mood and affect.    ED Course  Procedures (including critical care time)  Labs Reviewed  POCT I-STAT, CHEM 8 - Abnormal; Notable for the following:    Sodium 123 (*)    Chloride 95 (*)    BUN 5 (*)    Creatinine, Ser 0.40 (*)    Glucose, Bld 225 (*)    Calcium, Ion 1.05 (*)    All other components within normal limits  I-STAT, CHEM 8   No results found.   No diagnosis found.    MDM  Pain appears to be tension type.  Will given fluids, and migraine cocktail and reasess.  No red flags,  No incdication for repeat head CT or LP.    Follow up with your family doctor  today for recheck.  Return for worsening symptoms.  Fevers, chills stiff neck rashes on the skin weakness or numbness or any concerns.  Patient verbalizes understanding and agrees to follow up      Indianna Boran Smitty Cords, MD 05/06/11 [phone]

## 2011-05-06 NOTE — ED Notes (Signed)
C/o right sided abd pain onset yesterday; c/o HA and dizziness onset yesterday; reports fall yesterday for which she was seen and tx in this ED. Denies v/d; reports last normal BM yesterday.

## 2011-05-06 NOTE — ED Notes (Signed)
Patient states she has medicine for headache, but has not taken any today.

## 2011-05-06 NOTE — ED Notes (Signed)
Patient was in a soiled depends patient was changed into a clean depend. RN Revonda Standard aware.

## 2011-05-07 ENCOUNTER — Other Ambulatory Visit (INDEPENDENT_AMBULATORY_CARE_PROVIDER_SITE_OTHER): Payer: Self-pay | Admitting: Surgery

## 2011-05-07 ENCOUNTER — Emergency Department (HOSPITAL_COMMUNITY): Payer: Medicare HMO

## 2011-05-07 ENCOUNTER — Encounter (HOSPITAL_COMMUNITY)
Admission: EM | Disposition: A | Discharge status: Skilled Nursing Facility | Payer: Self-pay | Source: Home / Self Care | Attending: Internal Medicine

## 2011-05-07 ENCOUNTER — Inpatient Hospital Stay (HOSPITAL_COMMUNITY): Payer: Medicare HMO

## 2011-05-07 ENCOUNTER — Encounter (HOSPITAL_COMMUNITY): Payer: Self-pay | Admitting: Anesthesiology

## 2011-05-07 ENCOUNTER — Encounter (HOSPITAL_COMMUNITY): Payer: Self-pay | Admitting: Adult Health

## 2011-05-07 DIAGNOSIS — E871 Hypo-osmolality and hyponatremia: Secondary | ICD-10-CM

## 2011-05-07 DIAGNOSIS — E872 Acidosis: Secondary | ICD-10-CM | POA: Diagnosis present

## 2011-05-07 DIAGNOSIS — R109 Unspecified abdominal pain: Secondary | ICD-10-CM | POA: Diagnosis present

## 2011-05-07 DIAGNOSIS — K2210 Ulcer of esophagus without bleeding: Secondary | ICD-10-CM | POA: Diagnosis present

## 2011-05-07 DIAGNOSIS — R6521 Severe sepsis with septic shock: Secondary | ICD-10-CM

## 2011-05-07 DIAGNOSIS — K251 Acute gastric ulcer with perforation: Secondary | ICD-10-CM

## 2011-05-07 DIAGNOSIS — K631 Perforation of intestine (nontraumatic): Secondary | ICD-10-CM

## 2011-05-07 DIAGNOSIS — A419 Sepsis, unspecified organism: Secondary | ICD-10-CM

## 2011-05-07 DIAGNOSIS — J9600 Acute respiratory failure, unspecified whether with hypoxia or hypercapnia: Secondary | ICD-10-CM

## 2011-05-07 DIAGNOSIS — K668 Other specified disorders of peritoneum: Secondary | ICD-10-CM

## 2011-05-07 HISTORY — PX: GASTRECTOMY: SHX58

## 2011-05-07 HISTORY — PX: LAPAROTOMY: SHX154

## 2011-05-07 HISTORY — PX: GASTROSTOMY: SHX5249

## 2011-05-07 HISTORY — PX: CHOLECYSTECTOMY: SHX55

## 2011-05-07 LAB — BLOOD GAS, ARTERIAL
Acid-base deficit: 15.1 mmol/L — ABNORMAL HIGH (ref 0.0–2.0)
Acid-base deficit: 10.5 mmol/L — ABNORMAL HIGH (ref 0.0–2.0)
Bicarbonate: 9.8 mEq/L — ABNORMAL LOW (ref 20.0–24.0)
Bicarbonate: 14.1 mEq/L — ABNORMAL LOW (ref 20.0–24.0)
Drawn by: 225631
FIO2: 1 %
FIO2: 0.6 %
O2 Content: 2 L/min
O2 Saturation: 93.7 %
Patient temperature: 98.6
Patient temperature: 98.6
TCO2: 10.9 mmol/L (ref 0–100)
TCO2: 10.6 mmol/L (ref 0–100)
TCO2: 14.9 mmol/L (ref 0–100)
pCO2 arterial: 26.5 mmHg — ABNORMAL LOW (ref 35.0–45.0)
pH, Arterial: 7.233 — ABNORMAL LOW (ref 7.350–7.400)
pH, Arterial: 7.345 — ABNORMAL LOW (ref 7.350–7.400)
pO2, Arterial: 73.9 mmHg — ABNORMAL LOW (ref 80.0–100.0)
pO2, Arterial: 291 mmHg — ABNORMAL HIGH (ref 80.0–100.0)

## 2011-05-07 LAB — URINALYSIS, ROUTINE W REFLEX MICROSCOPIC
Bilirubin Urine: NEGATIVE
Glucose, UA: NEGATIVE mg/dL
Ketones, ur: NEGATIVE mg/dL
Leukocytes, UA: NEGATIVE
Nitrite: NEGATIVE
Protein, ur: 30 mg/dL — AB
Specific Gravity, Urine: >1.03 — ABNORMAL HIGH (ref 1.005–1.030)
pH: 5 (ref 5.0–8.0)

## 2011-05-07 LAB — DIFFERENTIAL
Basophils Absolute: 0 10*3/uL (ref 0.0–0.1)
Basophils Relative: 0 % (ref 0–1)
Eosinophils Absolute: 0 10*3/uL (ref 0.0–0.7)
Eosinophils Relative: 0 % (ref 0–5)
Lymphocytes Relative: 24 % (ref 12–46)
Monocytes Absolute: 0.3 10*3/uL (ref 0.1–1.0)
Monocytes Relative: 4 % (ref 3–12)
Neutro Abs: 2.3 10*3/uL (ref 1.7–7.7)
Neutrophils Relative %: 83 % — ABNORMAL HIGH (ref 43–77)

## 2011-05-07 LAB — POCT I-STAT 7, (LYTES, BLD GAS, ICA,H+H)
Acid-base deficit: 12 mmol/L — ABNORMAL HIGH (ref 0.0–2.0)
Bicarbonate: 16.3 mEq/L — ABNORMAL LOW (ref 20.0–24.0)
Calcium, Ion: 1.08 mmol/L — ABNORMAL LOW (ref 1.12–1.32)
Calcium, Ion: 1.01 mmol/L — ABNORMAL LOW (ref 1.12–1.32)
Calcium, Ion: 0.94 mmol/L — ABNORMAL LOW (ref 1.12–1.32)
HCT: 35 % — ABNORMAL LOW (ref 36.0–46.0)
HCT: 28 % — ABNORMAL LOW (ref 36.0–46.0)
HCT: 24 % — ABNORMAL LOW (ref 36.0–46.0)
Hemoglobin: 11.9 g/dL — ABNORMAL LOW (ref 12.0–15.0)
Hemoglobin: 8.2 g/dL — ABNORMAL LOW (ref 12.0–15.0)
O2 Saturation: 100 %
O2 Saturation: 100 %
Patient temperature: 35.5
Sodium: 131 mEq/L — ABNORMAL LOW (ref 135–145)
Sodium: 134 mEq/L — ABNORMAL LOW (ref 135–145)
pCO2 arterial: 23.4 mmHg — ABNORMAL LOW (ref 35.0–45.0)
pH, Arterial: 7.218 — ABNORMAL LOW (ref 7.350–7.400)
pH, Arterial: 7.357 (ref 7.350–7.400)
pO2, Arterial: 244 mmHg — ABNORMAL HIGH (ref 80.0–100.0)
pO2, Arterial: 281 mmHg — ABNORMAL HIGH (ref 80.0–100.0)

## 2011-05-07 LAB — BASIC METABOLIC PANEL
Calcium: 7.1 mg/dL — ABNORMAL LOW (ref 8.4–10.5)
Calcium: 6.4 mg/dL — CL (ref 8.4–10.5)
Creatinine, Ser: 0.82 mg/dL (ref 0.50–1.10)
GFR calc Af Amer: 78 mL/min — ABNORMAL LOW (ref >90)
GFR calc non Af Amer: 65 mL/min — ABNORMAL LOW (ref >90)
Sodium: 134 mEq/L — ABNORMAL LOW (ref 135–145)

## 2011-05-07 LAB — COMPREHENSIVE METABOLIC PANEL
ALT: 32 U/L (ref 0–35)
ALT: 37 U/L — ABNORMAL HIGH (ref 0–35)
Albumin: 2.6 g/dL — ABNORMAL LOW (ref 3.5–5.2)
Alkaline Phosphatase: 101 U/L (ref 39–117)
Alkaline Phosphatase: 36 U/L — ABNORMAL LOW (ref 39–117)
CO2: 14 mEq/L — ABNORMAL LOW (ref 19–32)
Chloride: 106 mEq/L (ref 96–112)
GFR calc Af Amer: 77 mL/min — ABNORMAL LOW (ref >90)
GFR calc non Af Amer: 66 mL/min — ABNORMAL LOW (ref >90)
Glucose, Bld: 148 mg/dL — ABNORMAL HIGH (ref 70–99)
Potassium: 3.7 mEq/L (ref 3.5–5.1)
Potassium: 3.9 mEq/L (ref 3.5–5.1)
Sodium: 115 mEq/L — CL (ref 135–145)
Sodium: 135 mEq/L (ref 135–145)
Total Protein: 6.3 g/dL (ref 6.0–8.3)

## 2011-05-07 LAB — CARBOXYHEMOGLOBIN
Carboxyhemoglobin: 1.3 % (ref 0.5–1.5)
Carboxyhemoglobin: 1.2 % (ref 0.5–1.5)
Methemoglobin: 0.8 % (ref 0.0–1.5)
Methemoglobin: 0.3 % (ref 0.0–1.5)
Total hemoglobin: 8.5 g/dL — ABNORMAL LOW (ref 12.5–16.0)

## 2011-05-07 LAB — URINE MICROSCOPIC-ADD ON

## 2011-05-07 LAB — CARDIAC PANEL(CRET KIN+CKTOT+MB+TROPI)
Relative Index: INVALID (ref 0.0–2.5)
Total CK: 45 U/L (ref 7–177)
Troponin I: <0.3 ng/mL (ref <0.30)

## 2011-05-07 LAB — LACTIC ACID, PLASMA: Lactic Acid, Venous: 2.8 mmol/L — ABNORMAL HIGH (ref 0.5–2.2)

## 2011-05-07 LAB — PRO B NATRIURETIC PEPTIDE: Pro B Natriuretic peptide (BNP): 1403 pg/mL — ABNORMAL HIGH (ref 0–450)

## 2011-05-07 LAB — CBC
HCT: 33.2 % — ABNORMAL LOW (ref 36.0–46.0)
HCT: 35.2 % — ABNORMAL LOW (ref 36.0–46.0)
Hemoglobin: 11.2 g/dL — ABNORMAL LOW (ref 12.0–15.0)
MCH: 26.4 pg (ref 26.0–34.0)
MCHC: 34.1 g/dL (ref 30.0–36.0)
MCV: 77.4 fL — ABNORMAL LOW (ref 78.0–100.0)
Platelets: 535 10*3/uL — ABNORMAL HIGH (ref 150–400)
RDW: 21 % — ABNORMAL HIGH (ref 11.5–15.5)
WBC: 3.1 10*3/uL — ABNORMAL LOW (ref 4.0–10.5)

## 2011-05-07 LAB — CK: Total CK: 95 U/L (ref 7–177)

## 2011-05-07 LAB — PHOSPHORUS: Phosphorus: 3.8 mg/dL (ref 2.3–4.6)

## 2011-05-07 LAB — POCT I-STAT 4, (NA,K, GLUC, HGB,HCT)
HCT: 23 % — ABNORMAL LOW (ref 36.0–46.0)
Hemoglobin: 7.8 g/dL — ABNORMAL LOW (ref 12.0–15.0)

## 2011-05-07 LAB — PROCALCITONIN
Procalcitonin: 130.46 ng/mL
Procalcitonin: 108.03 ng/mL

## 2011-05-07 LAB — GLUCOSE, CAPILLARY: Glucose-Capillary: 169 mg/dL — ABNORMAL HIGH (ref 70–99)

## 2011-05-07 LAB — CORTISOL: Cortisol, Plasma: 58 ug/dL

## 2011-05-07 SURGERY — LAPAROTOMY, EXPLORATORY
Anesthesia: General | Site: Abdomen | Wound class: Dirty or Infected

## 2011-05-07 MED ORDER — SODIUM CHLORIDE 0.9 % IV SOLN
INTRAVENOUS | Status: DC | PRN
  Administered 2011-05-07 (×2): via INTRAVENOUS

## 2011-05-07 MED ORDER — MIDAZOLAM HCL 5 MG/5ML IJ SOLN
INTRAMUSCULAR | Status: DC | PRN
  Administered 2011-05-07 (×3): 1 mg via INTRAVENOUS

## 2011-05-07 MED ORDER — MAGNESIUM SULFATE 50 % IJ SOLN
3.0000 g | Freq: Once | INTRAVENOUS | Status: AC
  Administered 2011-05-07: 3 g via INTRAVENOUS
  Filled 2011-05-07: qty 6

## 2011-05-07 MED ORDER — SODIUM CHLORIDE 0.9 % IV SOLN
INTRAVENOUS | Status: AC
  Administered 2011-05-07 (×2): via INTRAVENOUS

## 2011-05-07 MED ORDER — HYDROMORPHONE HCL PF 1 MG/ML IJ SOLN
0.5000 mg | INTRAMUSCULAR | Status: AC | PRN
  Administered 2011-05-07: 0.5 mg via INTRAVENOUS
  Filled 2011-05-07 (×2): qty 1

## 2011-05-07 MED ORDER — IOHEXOL 300 MG/ML  SOLN
100.0000 mL | Freq: Once | INTRAMUSCULAR | Status: AC | PRN
  Administered 2011-05-07: 100 mL via INTRAVENOUS

## 2011-05-07 MED ORDER — SODIUM CHLORIDE 0.9 % IV BOLUS (SEPSIS): 25.0000 mL/kg | Freq: Once | INTRAVENOUS | Status: DC

## 2011-05-07 MED ORDER — SODIUM CHLORIDE 0.9 % IV BOLUS (SEPSIS)
1000.0000 mL | Freq: Once | INTRAVENOUS | Status: AC
  Administered 2011-05-07 (×2): 1000 mL via INTRAVENOUS

## 2011-05-07 MED ORDER — VASOPRESSIN 20 UNIT/ML IJ SOLN: 0.0300 [IU]/min | INTRAVENOUS | Status: DC | PRN

## 2011-05-07 MED ORDER — SODIUM BICARBONATE 8.4 % IV SOLN
INTRAVENOUS | Status: DC | PRN
  Administered 2011-05-07: 100 meq via INTRAVENOUS
  Administered 2011-05-07: 50 meq via INTRAVENOUS

## 2011-05-07 MED ORDER — SODIUM CHLORIDE 0.9 % IR SOLN
Status: DC | PRN
  Administered 2011-05-07: 1
  Administered 2011-05-07: 7
  Administered 2011-05-07: 2
  Administered 2011-05-07: 1

## 2011-05-07 MED ORDER — FENTANYL CITRATE 0.05 MG/ML IJ SOLN
INTRAMUSCULAR | Status: AC
  Administered 2011-05-07: 100 ug via INTRAVENOUS
  Filled 2011-05-07: qty 2

## 2011-05-07 MED ORDER — LACTATED RINGERS IV SOLN
INTRAVENOUS | Status: DC | PRN
  Administered 2011-05-07 (×4): via INTRAVENOUS

## 2011-05-07 MED ORDER — NOREPINEPHRINE BITARTRATE 1 MG/ML IJ SOLN
4000.0000 ug | INTRAVENOUS | Status: DC | PRN
  Administered 2011-05-07: 5 ug/min via INTRAVENOUS

## 2011-05-07 MED ORDER — VANCOMYCIN HCL 1000 MG IV SOLR
1250.0000 mg | INTRAVENOUS | Status: DC
  Administered 2011-05-07 – 2011-05-18 (×12): 1250 mg via INTRAVENOUS
  Filled 2011-05-07 (×13): qty 1250

## 2011-05-07 MED ORDER — SODIUM CHLORIDE 0.9 % IV SOLN
250.0000 mL | INTRAVENOUS | Status: DC
  Administered 2011-05-08: 05:00:00 via INTRAVENOUS
  Administered 2011-05-08: 250 mL via INTRAVENOUS

## 2011-05-07 MED ORDER — DEXTROSE 5 % IV SOLN
1.0000 g | Freq: Once | INTRAVENOUS | Status: AC
  Administered 2011-05-07: 1 g via INTRAVENOUS
  Filled 2011-05-07: qty 10

## 2011-05-07 MED ORDER — SODIUM CHLORIDE 0.9 % IV SOLN
500.0000 mg | Freq: Three times a day (TID) | INTRAVENOUS | Status: AC
  Administered 2011-05-07 – 2011-05-21 (×41): 500 mg via INTRAVENOUS
  Filled 2011-05-07 (×44): qty 500

## 2011-05-07 MED ORDER — SODIUM CHLORIDE 0.9 % IV BOLUS (SEPSIS)
1000.0000 mL | Freq: Once | INTRAVENOUS | Status: AC
  Administered 2011-05-07: 1000 mL via INTRAVENOUS

## 2011-05-07 MED ORDER — DEXTROSE 10 % IV SOLN: INTRAVENOUS | Status: DC

## 2011-05-07 MED ORDER — SODIUM CHLORIDE 0.45 % IV SOLN
INTRAVENOUS | Status: DC
  Filled 2011-05-07 (×2): qty 1000

## 2011-05-07 MED ORDER — HYDROCORTISONE SOD SUCCINATE 100 MG IJ SOLR
50.0000 mg | Freq: Four times a day (QID) | INTRAMUSCULAR | Status: DC
  Administered 2011-05-07 – 2011-05-08 (×4): 50 mg via INTRAVENOUS
  Filled 2011-05-07 (×8): qty 1

## 2011-05-07 MED ORDER — FENTANYL CITRATE 0.05 MG/ML IJ SOLN
INTRAMUSCULAR | Status: DC | PRN
  Administered 2011-05-07: 50 ug via INTRAVENOUS
  Administered 2011-05-07 (×2): 100 ug via INTRAVENOUS
  Administered 2011-05-07: 50 ug via INTRAVENOUS
  Administered 2011-05-07 (×2): 100 ug via INTRAVENOUS

## 2011-05-07 MED ORDER — INSULIN ASPART 100 UNIT/ML ~~LOC~~ SOLN
0.0000 [IU] | SUBCUTANEOUS | Status: DC
  Administered 2011-05-08: 1 [IU] via SUBCUTANEOUS
  Administered 2011-05-08: 3 [IU] via SUBCUTANEOUS
  Administered 2011-05-08 – 2011-05-10 (×10): 1 [IU] via SUBCUTANEOUS
  Administered 2011-05-10 – 2011-05-11 (×3): 3 [IU] via SUBCUTANEOUS
  Administered 2011-05-11: 1 [IU] via SUBCUTANEOUS
  Administered 2011-05-11: 3 [IU] via SUBCUTANEOUS
  Administered 2011-05-11 (×2): 1 [IU] via SUBCUTANEOUS
  Administered 2011-05-11: 3 [IU] via SUBCUTANEOUS
  Administered 2011-05-12: 1 [IU] via SUBCUTANEOUS
  Administered 2011-05-12: 3 [IU] via SUBCUTANEOUS
  Administered 2011-05-12 (×3): 1 [IU] via SUBCUTANEOUS
  Administered 2011-05-13 (×4): 3 [IU] via SUBCUTANEOUS
  Administered 2011-05-13 (×2): 1 [IU] via SUBCUTANEOUS
  Administered 2011-05-14: 4 [IU] via SUBCUTANEOUS
  Administered 2011-05-14 (×3): 3 [IU] via SUBCUTANEOUS
  Administered 2011-05-14: 4 [IU] via SUBCUTANEOUS
  Administered 2011-05-14 – 2011-05-15 (×2): 3 [IU] via SUBCUTANEOUS
  Administered 2011-05-15: 1 [IU] via SUBCUTANEOUS
  Administered 2011-05-15 (×2): 3 [IU] via SUBCUTANEOUS
  Administered 2011-05-15 – 2011-05-20 (×16): 1 [IU] via SUBCUTANEOUS
  Administered 2011-05-20 (×2): 3 [IU] via SUBCUTANEOUS
  Administered 2011-05-20: 4 [IU] via SUBCUTANEOUS
  Administered 2011-05-20: 1 [IU] via SUBCUTANEOUS
  Administered 2011-05-21: 3 [IU] via SUBCUTANEOUS
  Administered 2011-05-21: 4 [IU] via SUBCUTANEOUS
  Administered 2011-05-21: 3 [IU] via SUBCUTANEOUS
  Administered 2011-05-21: 4 [IU] via SUBCUTANEOUS
  Administered 2011-05-21: 3 [IU] via SUBCUTANEOUS
  Administered 2011-05-21 – 2011-05-22 (×2): 1 [IU] via SUBCUTANEOUS
  Administered 2011-05-22 (×2): 3 [IU] via SUBCUTANEOUS
  Filled 2011-05-07 (×2): qty 3

## 2011-05-07 MED ORDER — SODIUM CHLORIDE 0.9 % IV BOLUS (SEPSIS)
500.0000 mL | Freq: Once | INTRAVENOUS | Status: AC
  Administered 2011-05-07: 500 mL

## 2011-05-07 MED ORDER — SODIUM CHLORIDE 0.9 % IV SOLN
100.0000 mg | INTRAVENOUS | Status: DC
  Administered 2011-05-08 – 2011-05-18 (×11): 100 mg via INTRAVENOUS
  Filled 2011-05-07 (×28): qty 100

## 2011-05-07 MED ORDER — SODIUM CHLORIDE 0.9 % IV SOLN
0.2000 ug/kg/h | INTRAVENOUS | Status: DC
  Administered 2011-05-07: 0.8 ug/kg/h via INTRAVENOUS
  Filled 2011-05-07: qty 2

## 2011-05-07 MED ORDER — MICAFUNGIN SODIUM 50 MG IV SOLR: 150.0000 mg | Freq: Every day | INTRAVENOUS | Status: DC

## 2011-05-07 MED ORDER — FENTANYL CITRATE 0.05 MG/ML IJ SOLN
50.0000 ug | INTRAMUSCULAR | Status: DC | PRN
  Administered 2011-05-07 (×2): 100 ug via INTRAVENOUS
  Administered 2011-05-07 – 2011-05-08 (×4): 200 ug via INTRAVENOUS
  Administered 2011-05-08 – 2011-05-11 (×9): 100 ug via INTRAVENOUS
  Filled 2011-05-07 (×2): qty 4
  Filled 2011-05-07 (×10): qty 2
  Filled 2011-05-07: qty 4
  Filled 2011-05-07: qty 2
  Filled 2011-05-07: qty 4

## 2011-05-07 MED ORDER — CALCIUM GLUCONATE 10 % IV SOLN
1.0000 g | Freq: Once | INTRAVENOUS | Status: AC
  Administered 2011-05-07: 1 g via INTRAVENOUS
  Filled 2011-05-07: qty 10

## 2011-05-07 MED ORDER — ALBUMIN HUMAN 5 % IV SOLN
INTRAVENOUS | Status: DC | PRN
  Administered 2011-05-07 (×3): via INTRAVENOUS

## 2011-05-07 MED ORDER — SODIUM CHLORIDE 0.9 % IV BOLUS (SEPSIS): 500.0000 mL | INTRAVENOUS | Status: DC | PRN

## 2011-05-07 MED ORDER — MORPHINE SULFATE 4 MG/ML IJ SOLN
4.0000 mg | Freq: Once | INTRAMUSCULAR | Status: AC
  Administered 2011-05-07: 4 mg via INTRAVENOUS
  Filled 2011-05-07: qty 1

## 2011-05-07 MED ORDER — SODIUM CHLORIDE 0.9 % IV SOLN
1.0000 g | Freq: Once | INTRAVENOUS | Status: AC
  Administered 2011-05-07: 1 g via INTRAVENOUS
  Filled 2011-05-07: qty 1

## 2011-05-07 MED ORDER — MIDAZOLAM HCL 2 MG/2ML IJ SOLN
INTRAMUSCULAR | Status: AC
  Administered 2011-05-07: 2 mg via INTRAVENOUS
  Filled 2011-05-07: qty 2

## 2011-05-07 MED ORDER — ROCURONIUM BROMIDE 100 MG/10ML IV SOLN
INTRAVENOUS | Status: DC | PRN
  Administered 2011-05-07 (×2): 20 mg via INTRAVENOUS
  Administered 2011-05-07: 50 mg via INTRAVENOUS

## 2011-05-07 MED ORDER — NOREPINEPHRINE BITARTRATE 1 MG/ML IJ SOLN
2.0000 ug/min | INTRAVENOUS | Status: DC | PRN
  Administered 2011-05-07: 5 ug/min via INTRAVENOUS
  Administered 2011-05-08: 12 ug/min via INTRAVENOUS
  Filled 2011-05-07 (×2): qty 4

## 2011-05-07 MED ORDER — FENTANYL CITRATE 0.05 MG/ML IJ SOLN
25.0000 ug | INTRAMUSCULAR | Status: DC | PRN
  Administered 2011-05-07 (×2): 50 ug via INTRAVENOUS
  Filled 2011-05-07: qty 2

## 2011-05-07 MED ORDER — SODIUM CHLORIDE 0.9 % IV BOLUS (SEPSIS)
500.0000 mL | Freq: Once | INTRAVENOUS | Status: AC
  Administered 2011-05-07: 500 mL via INTRAVENOUS

## 2011-05-07 MED ORDER — SODIUM CHLORIDE 0.9 % IV SOLN
0.2000 ug/kg/h | INTRAVENOUS | Status: AC
  Administered 2011-05-07: 0.3 ug/kg/h via INTRAVENOUS
  Filled 2011-05-07: qty 2

## 2011-05-07 MED ORDER — SODIUM CHLORIDE 0.9 % IV SOLN
8.0000 mg/h | INTRAVENOUS | Status: DC
  Administered 2011-05-07 – 2011-05-08 (×2): 8 mg/h via INTRAVENOUS
  Filled 2011-05-07 (×5): qty 80

## 2011-05-07 MED ORDER — SODIUM CHLORIDE 0.9 % IV SOLN
INTRAVENOUS | Status: DC
  Administered 2011-05-07 – 2011-05-12 (×3): via INTRAVENOUS

## 2011-05-07 MED ORDER — ETOMIDATE 2 MG/ML IV SOLN
10.0000 mg | Freq: Once | INTRAVENOUS | Status: AC
  Administered 2011-05-07: 10 mg via INTRAVENOUS

## 2011-05-07 MED ORDER — DOBUTAMINE IN D5W 4-5 MG/ML-% IV SOLN: 2.5000 ug/kg/min | INTRAVENOUS | Status: DC | PRN

## 2011-05-07 MED ORDER — SODIUM CHLORIDE 0.9 % IV SOLN: INTRAVENOUS | Status: DC

## 2011-05-07 MED ORDER — FENTANYL CITRATE 0.05 MG/ML IJ SOLN
INTRAMUSCULAR | Status: AC
  Administered 2011-05-08: 200 ug via INTRAVENOUS
  Filled 2011-05-07: qty 2

## 2011-05-07 MED ORDER — ONDANSETRON HCL 4 MG/2ML IJ SOLN
4.0000 mg | Freq: Three times a day (TID) | INTRAMUSCULAR | Status: AC | PRN
  Filled 2011-05-07: qty 2

## 2011-05-07 SURGICAL SUPPLY — 60 items
BLADE SURG ROTATE 9660 (MISCELLANEOUS) IMPLANT
CANISTER SUCTION 2500CC (MISCELLANEOUS) ×3 IMPLANT
CHLORAPREP W/TINT 26ML (MISCELLANEOUS) ×2 IMPLANT
CLOTH BEACON ORANGE TIMEOUT ST (SAFETY) ×2 IMPLANT
COVER SURGICAL LIGHT HANDLE (MISCELLANEOUS) ×2 IMPLANT
DRAIN CHANNEL 19F RND (DRAIN) ×2 IMPLANT
DRAPE LAPAROSCOPIC ABDOMINAL (DRAPES) ×2 IMPLANT
DRAPE PROXIMA HALF (DRAPES) ×1 IMPLANT
DRAPE WARM FLUID 44X44 (DRAPE) ×2 IMPLANT
ELECT BLADE 6.5 EXT (BLADE) ×1 IMPLANT
ELECT CAUTERY BLADE 6.4 (BLADE) ×1 IMPLANT
ELECT REM PT RETURN 9FT ADLT (ELECTROSURGICAL) ×2
ELECTRODE REM PT RTRN 9FT ADLT (ELECTROSURGICAL) ×1 IMPLANT
EVACUATOR SILICONE 100CC (DRAIN) ×2 IMPLANT
GLOVE BIO SURGEON STRL SZ7.5 (GLOVE) ×3 IMPLANT
GLOVE BIOGEL PI IND STRL 6.5 (GLOVE) IMPLANT
GLOVE BIOGEL PI IND STRL 7.5 (GLOVE) IMPLANT
GLOVE BIOGEL PI IND STRL 8 (GLOVE) ×1 IMPLANT
GLOVE BIOGEL PI INDICATOR 6.5 (GLOVE) ×1
GLOVE BIOGEL PI INDICATOR 7.5 (GLOVE) ×2
GLOVE BIOGEL PI INDICATOR 8 (GLOVE) ×2
GLOVE ECLIPSE 8.0 STRL XLNG CF (GLOVE) ×3 IMPLANT
GLOVE EXAM NITRILE MD LF STRL (GLOVE) ×1 IMPLANT
GLOVE SURG SIGNA 7.5 PF LTX (GLOVE) ×1 IMPLANT
GLOVE SURG SS PI 6.5 STRL IVOR (GLOVE) ×1 IMPLANT
GOWN PREVENTION PLUS XLARGE (GOWN DISPOSABLE) ×3 IMPLANT
GOWN STRL NON-REIN LRG LVL3 (GOWN DISPOSABLE) ×6 IMPLANT
KIT BASIN OR (CUSTOM PROCEDURE TRAY) ×2 IMPLANT
KIT ROOM TURNOVER OR (KITS) ×2 IMPLANT
LIGASURE IMPACT 36 18CM CVD LR (INSTRUMENTS) IMPLANT
MANIFOLD NEPTUNE WASTE (CANNULA) ×1 IMPLANT
NS IRRIG 1000ML POUR BTL (IV SOLUTION) ×13 IMPLANT
PACK GENERAL/GYN (CUSTOM PROCEDURE TRAY) ×2 IMPLANT
PAD ARMBOARD 7.5X6 YLW CONV (MISCELLANEOUS) ×4 IMPLANT
SEALER TISSUE X1 CVD JAW (INSTRUMENTS) ×1 IMPLANT
SPECIMEN JAR LARGE (MISCELLANEOUS) ×1 IMPLANT
SPECIMEN JAR SMALL (MISCELLANEOUS) ×1 IMPLANT
SPECIMEN JAR X LARGE (MISCELLANEOUS) IMPLANT
SPONGE GAUZE 4X4 12PLY (GAUZE/BANDAGES/DRESSINGS) ×3 IMPLANT
SPONGE LAP 18X18 X RAY DECT (DISPOSABLE) ×2 IMPLANT
STAPLER 90 3.5 STAND SLIM (STAPLE) ×2
STAPLER 90 3.5 STD SLIM (STAPLE) IMPLANT
STAPLER PROXIMATE 75MM BLUE (STAPLE) ×1 IMPLANT
STAPLER VISISTAT 35W (STAPLE) ×2 IMPLANT
SUCTION POOLE TIP (SUCTIONS) ×2 IMPLANT
SUT ETHILON 2 0 FS 18 (SUTURE) ×2 IMPLANT
SUT PDS AB 1 TP1 96 (SUTURE) ×4 IMPLANT
SUT PDS AB 3-0 SH 27 (SUTURE) ×8 IMPLANT
SUT PROLENE 3 0 SH 48 (SUTURE) ×1 IMPLANT
SUT SILK 2 0 SH CR/8 (SUTURE) ×3 IMPLANT
SUT SILK 2 0 TIES 10X30 (SUTURE) ×2 IMPLANT
SUT SILK 3 0 SH CR/8 (SUTURE) ×2 IMPLANT
SUT SILK 3 0 TIES 10X30 (SUTURE) ×2 IMPLANT
TAPE CLOTH SURG 4X10 WHT LF (GAUZE/BANDAGES/DRESSINGS) ×3 IMPLANT
TOWEL OR 17X24 6PK STRL BLUE (TOWEL DISPOSABLE) ×1 IMPLANT
TOWEL OR 17X26 10 PK STRL BLUE (TOWEL DISPOSABLE) ×2 IMPLANT
TOWEL OR NON WOVEN STRL DISP B (DISPOSABLE) ×2 IMPLANT
TRAY FOLEY CATH 14FRSI W/METER (CATHETERS) IMPLANT
WATER STERILE IRR 1000ML POUR (IV SOLUTION) IMPLANT
YANKAUER SUCT BULB TIP NO VENT (SUCTIONS) ×1 IMPLANT

## 2011-05-07 NOTE — Procedures (Signed)
Central Venous Catheter Insertion Procedure Note MEAGEN LIMONES [id number] 20-Nov-1931  Procedure: Insertion of Central Venous Catheter Indications: Drug and/or fluid administration  Procedure Details Consent: Risks of procedure as well as the alternatives and risks of each were explained to the (patient/caregiver).  Consent for procedure obtained. Time Out: Verified patient identification, verified procedure, site/side was marked, verified correct patient position, special equipment/implants available, medications/allergies/relevent history reviewed, required imaging and test results available.  Performed  Maximum sterile technique was used including antiseptics, cap, gloves, gown, hand hygiene, mask and sheet. Skin prep: Chlorhexidine; local anesthetic administered A antimicrobial bonded/coated triple lumen catheter was placed in the left internal jugular vein using the Seldinger technique.  Evaluation Blood flow good Complications: No apparent complications Patient did tolerate procedure well. Chest X-ray ordered to verify placement.  CXR: pending.  Nelda Bucks 05/07/2011, 11:42 AM  [place] guidnace

## 2011-05-07 NOTE — Brief Op Note (Addendum)
05/06/2011 - 05/07/2011  4:25 PM  PATIENT:  [name]  77 y.o. female  PRE-OPERATIVE DIAGNOSIS:  Free air abdomen  POST-OPERATIVE DIAGNOSIS:  perforated pyloric ulcer  PROCEDURE:  Procedure(s): EXPLORATORY LAPAROTOMY w LOA x 60 minutes GASTROSTOMY JEJUNOSTOMY PARTIAL GASTRECTOMY (ANTRUM & DUODENUM Bulb/parts 1&2) OPEN CHOLECYSTECTOMY  SURGEON:  Surgeon(s): Ardeth Sportsman, MD  PHYSICIAN ASSISTANT:   ASSISTANTS: Zola Button, PA   ANESTHESIA:   general  EBL:  Total I/O In: 6800 [I.V.:4100; Blood:700; IV Piggyback:2000] Out: 770 [Urine:770]  BLOOD ADMINISTERED:700 CC PRBC  DRAINS:  RUQ: 19Fr Blake drain(s) with closed bulb suction across the duodenal sweep and GJ anastomosis RLQ: 19Fr. Blake drain runs up the left paracolic gutter to splenic fossa LUQ: Gastrostomy Tube 24Fr pull PEG type L mid abdomen: Jejunostomy Tube 18Fr EndoVive w 1mL balloon  LOCAL MEDICATIONS USED: NONE  SPECIMEN:  Source of Specimen:  Antrum of stomach w upper duodenum containing the perforated ulcer.  Gallbladder  DISPOSITION OF SPECIMEN:  PATHOLOGY  COUNTS:  YES  TOURNIQUET:  * No tourniquets in log *  DICTATION: .Other Dictation: Dictation Number [id number]  PLAN OF CARE: Stay in ICU  PATIENT DISPOSITION:  ICU - intubated and critically ill.   Delay start of Pharmacological VTE agent (>24hrs) due to surgical blood loss or risk of bleeding:  Yes  D/w pt's stepdaughter, [name].  She expressed understanding & Appreciation

## 2011-05-07 NOTE — ED Notes (Signed)
To CT

## 2011-05-07 NOTE — Preoperative (Signed)
Beta Blockers   Reason not to administer Beta Blockers:Not Applicable 

## 2011-05-07 NOTE — ED Notes (Signed)
Pt states she feels better now. Pt waiting for care link to transport to [facility]

## 2011-05-07 NOTE — Anesthesia Preprocedure Evaluation (Addendum)
Anesthesia Evaluation  Patient identified by MRN, date of birth, ID band Patient unresponsive    Reviewed: Allergy & Precautions, H&P , reviewed documented beta blocker date and time   Airway       Dental   Pulmonary  + rhonchi  + wheezing      Cardiovascular hypertension (pt not on b blockers), Regular Tachycardia    Neuro/Psych  Headaches, PSYCHIATRIC DISORDERS Depression  Neuromuscular disease    GI/Hepatic hiatal hernia, PUD,   Endo/Other  Diabetes mellitus-  Renal/GU      Musculoskeletal   Abdominal   Peds  Hematology   Anesthesia Other Findings   Reproductive/Obstetrics                         Anesthesia Physical Anesthesia Plan  ASA: IV and Emergent  Anesthesia Plan: General   Post-op Pain Management:    Induction: Intravenous  Airway Management Planned: Oral ETT  Additional Equipment: Arterial line and CVP  Intra-op Plan:   Post-operative Plan: Post-operative intubation/ventilation  Informed Consent:   Plan Discussed with:   Anesthesia Plan Comments: (77 year old African American female noted to have free air in the abdomen by CT scan. She is now scheduled for an emergency exploratory laparotomy by [name]. The patient was intubated prior to transport to the operating room. She was noted to be on a norepinephrine infusion for blood pressure support. She appears to be in acute septic shock with acute respiratory failure. The plan is to maintain ventilation postoperatively.)        Anesthesia Quick Evaluation

## 2011-05-07 NOTE — Progress Notes (Signed)
eLink Physician-Brief Progress Note Patient Name: [name] DOB: [birth year] MRN: [id number]  Date of Service  05/07/2011   HPI/Events of Note  Labs reviewed  1. Mag 1.1 2. Sepsis - PCT 100, lactate 2.7 and improved. BP normal. ABG no acidosis. CVP 10. Scvo2 90s, hgb 12   eICU Interventions  Replete mag Increase fluid replacement   Intervention Category Major Interventions: Infection - evaluation and management;Sepsis - evaluation and management  Joud Ingwersen 05/07/2011, 10:21 PM

## 2011-05-07 NOTE — Progress Notes (Signed)
ANTIBIOTIC CONSULT NOTE - INITIAL  Pharmacy Consult for Vancomycin/Primaxin Indication: bowel perf; r/o sepsis  Allergies  Allergen Reactions  . Other Swelling    Pinto beans cause swelling and hives  . Penicillins Swelling    Patient Measurements: Height: 5' 6.5" (168.9 cm) Weight: 175 lb 14.8 oz (79.8 kg) IBW/kg (Calculated) : 60.44   Vital Signs: Temp: 97.7 F (36.5 C) (11/08 0845) Temp src: Oral (11/08 0845) BP: 103/77 mmHg (11/08 1000) Pulse Rate: 132  (11/08 1000) Intake/Output from previous day:   Intake/Output from this shift: Total I/O In: 650 [I.V.:150; IV Piggyback:500] Out: 640 [Urine:640]  Labs:  [facility] 05/06/11 2336 05/06/11 0533 05/05/11 0536  WBC 6.3 -- 6.9  HGB 12.0 13.6 10.7*  PLT 535* -- 422*  LABCREA -- -- --  CREATININE 0.59 0.40* 0.64   Estimated Creatinine Clearance: 63.4 ml/min (by C-G formula based on Cr of 0.59). No results found for this basename: VANCOTROUGH:2,VANCOPEAK:2,VANCORANDOM:2,GENTTROUGH:2,GENTPEAK:2,GENTRANDOM:2,TOBRATROUGH:2,TOBRAPEAK:2,TOBRARND:2,AMIKACINPEAK:2,AMIKACINTROU:2,AMIKACIN:2, in the last 72 hours   Microbiology: Recent Results (from the past 720 hour(s))  URINE CULTURE     Status: Normal   Collection Time   04/14/11  6:24 AM      Component Value Range Status Comment   Specimen Description URINE, CLEAN CATCH   Final    Special Requests NONE   Final    Setup Time [id number]   Final    Colony Count 45,000 COLONIES/ML   Final    Culture     Final    Value: Multiple bacterial morphotypes present, none predominant. Suggest appropriate recollection if clinically indicated.   Report Status 04/15/2011 FINAL   Final     Medical History: Past Medical History  Diagnosis Date  . Hypertension   . Reflux   . Right elbow pain     OTIF  . Dementia   . Depression   . Osteoarthritis   . Pancreatitis 11/2007    HOP  . Angiomyolipoma of kidney     right  . Ulcerative esophagitis 12/05/2007    hx elevated gastrin,  severe on EGD by Dr Jena Gauss , h pylori negative  . Hiatal hernia   . S/P colonoscopy 2009    pt reports normal by Dr Lovell Sheehan  . Diabetes mellitus   . Anemia   . Hyponatremia     Medications:  Prescriptions prior to admission  Medication Sig Dispense Refill  . acetaminophen (TYLENOL) 500 MG tablet Take 1 tablet (500 mg total) by mouth 3 (three) times daily as needed for pain.  60 tablet  0  . amLODipine (NORVASC) 5 MG tablet Take 1 tablet (5 mg total) by mouth daily.  30 tablet  3  . butalbital-acetaminophen-caffeine (FIORICET) 50-325-40 MG per tablet One tablet twice daily as needed for severe headache  40 tablet  2  . butalbital-acetaminophen-caffeine (FIORICET) 50-325-40 MG per tablet Take 1-2 tablets by mouth every 8 (eight) hours as needed for headache.  20 tablet  0  . calcium-vitamin D (OSCAL WITH D) 500-200 MG-UNIT per tablet Take 1 tablet by mouth 2 (two) times daily.       . ENABLEX 15 MG 24 hr tablet TAKE 1 TABLET BY MOUTH ONCE A DAY FOR BLADDER.  30 each  3  . Ensure Plus (ENSURE PLUS) LIQD Take 1 Can by mouth daily.  30 Can  2  . esomeprazole (NEXIUM) 40 MG capsule Take 40 mg by mouth daily.       . fish oil-omega-3 fatty acids 1000 MG capsule Take 1 g by  mouth daily. OTC        . glipiZIDE (GLUCOTROL) 10 MG tablet Take 1 tablet (10 mg total) by mouth daily. DO NOT TAKE THIS MEDICINE IF YOUR BLOOD SUGAR IS LESS THAN 130.  30 tablet  3  . HYDROcodone-acetaminophen (NORCO) 5-325 MG per tablet Take 1-2 tablets by mouth every 6 (six) hours as needed for pain.  10 tablet  0  . HYDROcodone-acetaminophen (NORCO) 5-325 MG per tablet Take 1 tablet by mouth every 4 (four) hours as needed for pain.  20 tablet  0  . LOTENSIN 20 MG tablet TAKE ONE TABLET DAILY.  30 each  3  . metFORMIN (GLUCOPHAGE) 1000 MG tablet TAKE ONE TABLET BY MOUTH TWICE DAILY WITH FOOD FORDIABETES.  60 tablet  3  . Multiple Vitamin (MULTIVITAMIN) tablet Take 1 tablet by mouth daily.        . ondansetron (ZOFRAN ODT) 4  MG disintegrating tablet Take 1 tablet (4 mg total) by mouth every 8 (eight) hours as needed for nausea.  10 tablet  0  . ondansetron (ZOFRAN) 4 MG tablet Take 1 tablet (4 mg total) by mouth every 6 (six) hours.  12 tablet  0  . polyethylene glycol (MIRALAX / GLYCOLAX) packet ADD TO WATER DAILY FOR CONSTIPATION.  14 each    . potassium chloride SA (K-DUR,KLOR-CON) 20 MEQ tablet Take 1 tablet (20 mEq total) by mouth daily.  30 tablet  1  . QUEtiapine (SEROQUEL) 25 MG tablet Take 25 mg by mouth 2 (two) times daily as needed. FOR SLEEP         Assessment: 77 y/o female patient admitted with abd pain, CT reveals bowel perforation requiring broad spectrum antibiotics for r/o sepsis.  Ke=0.05669, t1/2=12.2  Goal of Therapy:  Vancomycin trough level 10-15 mcg/ml  Plan:  Vancomycin 1250mg  iv q24h. primaxin 500mg  iv q8h. F/u c&s, monitor renal fxn. Measure antibiotic drug levels at steady [address], [address] M 05/07/2011,10:54 AM

## 2011-05-07 NOTE — Op Note (Signed)
[name], [name] NO.:  [id number]  MEDICAL RECORD NO.:  [id number]  LOCATION:  3109                         FACILITY:  [facility]  PHYSICIAN:  Ardeth Sportsman, MD     DATE OF BIRTH:  13-Oct-1931  DATE OF PROCEDURE:  05/07/2011 DATE OF DISCHARGE:                              OPERATIVE REPORT   PRIMARY CARE PHYSICIAN:  Milus Mallick. Lodema Hong, M.D.  GASTROENTEROLOGIST:  [name], M.D.  OTHER SURGEON:  Tilford Pillar, MD,  up in [facility].  Interventional Radiologist: Edwyna Ready, III, MD  SURGEON:  Ardeth Sportsman, MD.  ASSISTANT:  Eber Hong, P.A..  PREOPERATIVE DIAGNOSIS:  Pneumoperitoneum presumed secondary to perforated ulcer.  POSTOPERATIVE DIAGNOSIS:  Pneumoperitoneum with massive bilious contamination due to large perforation of peripyloric ulcer.  PROCEDURES PERFORMED: 1. Exploratory laparotomy. 2. Lysis of adhesions, x60 minutes. 3. Antrectomy and proximal duodenal resection. 4. Billroth-II reconstruction with side-to-side stapled     gastrojejunostomy anastomosis. 5. Omental patching of duodenal bulb. 6. Open cholecystectomy. 7. 24-French gastrostomy tube placement. 8. 18-French jejunostomy tube placement.  ANESTHESIA:  General anesthesia.  SPECIMENS: 1. Antrum of stomach and duodenal bulb with perforated ulcer within     it.  Silk stitches after perforation. 2. Gallbladder   DRAINS:.  On the right side of the abdomen, the patient     has 19-French drains x2.  1.The right upper quadrant one runs to the Morison pouch over the duodenal bulb and ends at the gastrojejunostomy anastomosis.  2. The right lower quadrant drain runs across to the left pericolic gutter and the tip is in the splenic fossa.  ESTIMATED BLOOD LOSS:  300 mL.  COMPLICATIONS:  None major.  INDICATIONS:  [name] is a 77 year old female who has had abdominal pain, and a bleeding ulcer that was noted on endoscopy, OP note above. Dr. [name] did the  endoscopy.  She had deteriorated and required a laparotomy by Dr. Leticia Penna and Dr. Lovell Sheehan up in [facility]. They were concerned about a possible malignancy, but biopsy was negative.  She had a rebleed was transferred to [facility].  She had interventional radiology coiling of the gastroduodenal artery.  She stabilized and was discharged home.  Apparently, she started having a headache and abdominal pain.  She came in to [facility] ER last night.  She had a CAT scan done around midnight.  The read at 2 a.m. was concerning for free air and pneumoperitoneum.  The patient was transferred to [facility] around 09:30 am.  We were consulted.  Given the free air and extravasation of contrast, I recommended emergent exploration with the patient.  I called the stepdaughter, [name], and discussed with her in detail as well.  Differential diagnosis was discussed.  High likelihood of death without laparotomy was discussed.  High risk of morbidity since the patient was already getting intubated and was in shock, was discussed as well. However, I think her only chance of survival is surgery.  Risks, benefits, alternatives were discussed.  Questions were answered and stepdaughter agreed to proceed.  The patient did give a verbal consent as well, but was not able  to sign consent as she had to be intubated for severe shock.  OPERATIVE FINDINGS:  She had a large perforation somewhere between the pylorus and duodenal bulb.  It involved at least two-thirds of the circumference.  The coil from the gastroduodenal artery had eroded into the peritoneum and was right nearby.  She had an obvious duodenal stricture that I could barely get my pinky across just distal to the perforation as well.  She had over a liter of bilious ascites contamination.  She had moderate early peritonitis.  She had no obvious evidence of malignancy.  While her pancreas was somewhat inflamed, it did not seem to be a  hard phlegmonous mass.  DESCRIPTION OF PROCEDURE:  Informed consent was confirmed.  The patient underwent general anesthesia, was already intubated in the ICU.  She was already on IV vancomycin and Primaxin and micafungin for broad coverage. Her abdomen was prepped and draped in sterile fashion.  I entered the abdomen through her prior upper midline laparotomy incision.  I went a little up to the xiphoid and then went a little bit below that given the high suspicion of perforation of her ulcer.  I entered in the subxiphoid region where it was clean.  A finger sweep was able to free off a few omental adhesions.  The anterior abdominal wall adhesions were not too bad.  I encountered a large volume of bile upon entering into the peritoneal cavity.  I aspirated all quadrants and down the pelvis as well, her liver and the antrum and lesser curve where the bile stained.  In elevating the liver, I could see a rosebud of mucosa near the pylorus, consistent with a perforation.  Inspection noted that it was a large rosebud.  It was about a 2.5 cm hole, >2/3 circumference of the duodenum.  It was smooth.  There was not any firm hard rockiness to it.  There was no major bleeding associated with it looking down and I could see a corkscrew of metal coming up right there. I pulled it out and it was a spring coil consistent with her prior coiling.  I could feel the duodenum lumen and just distal there was a stricturing that I could barely get my pinky through.  I did inspection of rest of the abdomen.  While she had contamination of the small intestine and colon, it did not appear to be perforated. There was no evidence of any diverticulitis.  I returned back to the upper abdomen.  Over time, I was able to free the greater omentum off the transverse colon.  It was a challenge to get in the lesser sac, but after going nearly over to the splenic flexure,  I could get in the lesser sac and come medially  over.  I did mobilize the hepatic flexure of the colon.  I was able to get from the ascending colon the retroperitoneum overlying the right kidney to get to the duodenal sweep and help meet those 2 areas between the splenic flexure and the hepatic flexure to help free the mid transverse colon off. The transverse colon was actually quite stuck near by the perforation, but after freeing off the rest of the transverse colon, I could come around to the mesentery and then with sharp scissors free it off the duodenal sweep.  I inspected and found no serosal injury or other abnormality.  The pancreas did seem a little inflamed and thickened, but I did not feel any discrete mass or tumor.  The duodenum otherwise looked rather normal, not particularly inflamed except at the perforated reinfusion point.  I did mobilize in a Kocher maneuver the duodenal sweep to help bring it up.  On looking this, the hole was involved, over 66% of circumference and I would say it was at least two-thirds if not more.  I did not think this was amenable to a patch.  Because there had been no evidence of malignancy on the prior biopsy and I felt like I could get around it, I decided to proceed with resection.  I was able to free the antrum off its attachments and get back along the lesser curve to help mobilize the antrum.  I did mobilize the duodenum and eventually free the duodenal bulb off the common bile duct and sharp dissection to free that off using Metzenbaum scissors.  I did have a few pancreatic bed and bile duct feeding vessel bleeders that I controlled with figure-of-8, 3-0 Prolene, and 3-0 PDS stitches.  Eventually, I was able to mobilize the duodenal bulb and peel it off the pancreatic head and the common bile duct until I got into the second portion of the duodenum.  I could see bile coming up from the more distal duodenum and to make sure I was not near the ampulla.  With good mobilization, I decided to  proceed with resection.  I transected the antrum using T90 stapler.  I oversewed the staple line using a 3-0 PDS in a running fashion.  I used sharp scissors and freed off the duodenal bulb, first part, and start of the second part of the duodenum.  I ended up taking a little bit more anteriorly and trying to spare the duodenal portion more posterior.  I sent the specimen off.  I did mobilize the duodenum off its retroperitoneal attachments.  At this point, I did copious irrigation with about 10 L of saline to help further clean out everything else.  Bile was coming up the duodenal bulb.  I could feels a good candidate for the ampulla & it was soft.  I closed the duodenal bulb using a running 2-0 PDS to bring the mucosa together well.  I did a figure-of-8 in another corner.  I had a good seal.  I laid the falciform ligament over as a patch and sewed that patch down over the duodenal bulb using interrupted 2-0 silk stitches to good results.  That helped patch the duodenal bulb down well.  I proceeded with a gastrojejunostomy.  I had made a defect in the transverse colon just to the left of the middle colic vessels and brought a loop of jejunum up.  It was about 30 cm distal to the ligament of Treitz.  I laid it in a way such that and did a side-to-side stapled anastomosis with a GIA 75 stapler.  I placed a crotch stitch on the more proximal upper end.  The distal hole opening, I used to inspect the staple line with good hemostasis.  I reapproximated the staple line hole using running PDS stitch as well to good result.  Of note, the proximal jejunum is lateral and the distal jejunum is more midline to allow the gastric contents and food to proceed in an antegrade fashion.  I did close the [place] defect in the transverse mesocolon using interrupted silk stitches to good result.  I did do another stitch on the jejunum just distal to that point to help keep a nice straight line as it came  out. The biliary limb looked well, it was snug but not overly tight.  I inspected the omentum and some of it had been rather necrosed.  I did trim that back.  I allowed the omentum to lay over the gastrojejunostomy and allow what remained over the duodenal bulb as well.  I placed drains as well.  I did more copious irrigation.  I went ahead and proceeded with open cholecystectomy given the fact that the gallbladder looked rather inflamed and dilated.  I freed the gallbladder from its attachments on the gallbladder fossa and came down to a point.  I isolated the cystic artery and ligated it using a 2-0 silk tie.  I isolated the cystic duct off the infundibulum and ligated using 2-0 silk ties interrupted x2.  I sent the specimen off.  The common bile duct looked healthy.  At this point, I did another 2 L of irrigation and inspected all anastomosis and they looked viable without any evidence of leak.  I made sure the orogastric tube was resting in the body of the stomach. Next, I proceeded with gastrostomy tube placement.  I placed the PEG tube through the left upper quadrant puncture site.  I made a PDS pursestring stitch around the mid body of the stomach a few centimeters off the greater curve.  I made an opening and clipped the flange of the PEG like gastrostomy tube into the stomach and tied that down.  I wrapped the PDS stitch around the tube as well to help it from falling down.  I did a Stamm gastrostomy by using interrupted 2-0 silk stitches from the peritoneum to the stomach x5 to have a good approximation and good seal around the tube.  Next, I placed the jejunostomy tube.  I used an 18-French EndoVive tube. I went ahead and placed it in a similar fashion.  I placed all the stitch before I put the tube in.  The tube went into the left mid abdomen.  I then blew up the balloon.  I initially put 3 mL, but it seemed rather full, so I brought it back down to a milliliter,  that filled less than 50% of the lumen of the jejunum.  I tied that down, that laid well.  I did put a stitch a few centimeters proximally and distally to help lay the jejunum as well.  The jejunostomy tube was about 3 cm distal to the gastrojejunostomy anastomosis.  I did irrigation again and reinspection.  Hemostasis was good.  I closed the fascia using #1 looped PDS.  I closed the skin using staples about every 5 cm and packed with some mildly soaked Betadine gauze.  Sterile dressing was applied.  The patient was sent to the ICU, intubated and in a critical condition. I discussed the findings with the family.     Ardeth Sportsman, MD     SCG/MEDQ  D:  05/07/2011  T:  05/07/2011  Job:  [id number]  cc:   [name], M.D. [address] [place] , [place] [address]  Milus Mallick. Lodema Hong, M.D. Fax: [phone]  Tilford Pillar, MD Fax: [phone]

## 2011-05-07 NOTE — Progress Notes (Signed)
eLink Physician-Brief Progress Note Patient Name: [name] DOB: [birth year] MRN: [id number]  Date of Service  05/07/2011   HPI/Events of Note  1. Low calcium 2. ALso, waking up and gettnig agitated and boucking vent. Needing prn fentanyl    eICU Interventions  1. Replete calcium 2. Add precedex gtt   Intervention Category Major Interventions: Sepsis - evaluation and management;Hypovolemia - evaluation and treatment with fluids  Caison Hearn 05/07/2011, 8:55 PM

## 2011-05-07 NOTE — ED Notes (Signed)
Dr. Brooke Dare cancelled admission and is requesting a transfer to [facility] for perforated ulcer.  Patient informed of change.

## 2011-05-07 NOTE — ED Notes (Signed)
Assumed care of patient; received report from Audie Pinto, RN.  Patient awaiting bed assignment.

## 2011-05-07 NOTE — Progress Notes (Signed)
Now in septic shock . EGDT, levophed, volume, stress steroids. ABX on going stat Surgery abotu to take to OR. Vent management,. Increase MV [name]

## 2011-05-07 NOTE — Consult Note (Signed)
[name] is an 77 y.o. female.   Chief Complaint Presented with headache, now with abdominal free air. Requeting Physician: Dr. Brooks Sailors  Patient Care Team: [name], MD as PCP - General Fabio Bering as Consulting Physician (General Surgery)   HPI: 77 y/o female presented to ER [facility] yesterday c/o Headache.  She has a history of multiple visits.  Noted to c/o abdominal pain.  CT was obtained last night showing fluid and free air consistant with a perforation.  She was transferred to [facility] and CCM service this AM in shock and worsening peritonitis.  We were asked to see the patient & came emergently.  Pt. cannot give much of a history she complains on and off of pain.  No vomting.  Last meal and BM was yesterday.    She had an ex lap [id number] at [facility] or UGI bleeding and was found to have an gastric antral/pancreatic mass.  They were concerned about a cancer, but biopsies were negative.  She was transferred to [facility] and had embolization of the gastroduodenel artery, stabilized , and discharged home after recovering rather well later in August.   Past Medical History  Diagnosis Date  . Hypertension   . Reflux   . Right elbow pain     OTIF  . Dementia   . Depression   . Osteoarthritis   . Pancreatitis 11/2007    HOP  . Angiomyolipoma of kidney     right  . Ulcerative esophagitis 12/05/2007    hx elevated gastrin, severe on EGD by Dr Jena Gauss , h pylori negative  . Hiatal hernia   . S/P colonoscopy 2009    pt reports normal by Dr Lovell Sheehan  . Diabetes mellitus   . Anemia   . Hyponatremia     Past Surgical History  Procedure Date  . Orif right hip 1999    [facility]  . Umbilical hernia repair 76 months old    [place]  . Esophagogastroduodenoscopy 01/28/2011    Procedure: ESOPHAGOGASTRODUODENOSCOPY (EGD);  Surgeon: Arlyce Harman, MD;  Location: [facility] ENDO SUITE;  Service: Endoscopy;  Laterality: N/A;  . Colonoscopy 01/28/2011    Procedure: COLONOSCOPY;  Surgeon: Arlyce Harman, MD;   Location: [facility] ENDO SUITE;  Service: Endoscopy;  Laterality: N/A;  . Laparotomy 02/04/2011    Procedure: EXPLORATORY LAPAROTOMY;  Surgeon: Fabio Bering;  Location: [facility] ORS;  Service: General;  Laterality: N/A;    Family History  Problem Relation Age of Onset  . Cancer Mother     pelvic    Social History:  reports that she has never smoked. Her smokeless tobacco use includes Chew. She reports that she does not drink alcohol or use illicit drugs.  Allergies:  Allergies  Allergen Reactions  . Other Swelling    Pinto beans cause swelling and hives  . Penicillins Swelling    Medications Prior to Admission  Medication Dose Route Frequency Provider Last Rate Last Dose  . 0.9 %  sodium chloride infusion   Intravenous Once April K Palumbo-Rasch, MD   500 mL at 05/06/11 0615  . 0.9 %  sodium chloride infusion   Intravenous Once Dayton Bailiff, MD      . 0.9 %  sodium chloride infusion   Intravenous STAT Dayton Bailiff, MD 150 mL/hr at 05/07/11 0900    . 0.9 %  sodium chloride infusion  250 mL Intravenous Continuous Danford Bad, NP      . sodium chloride 0.9 % bolus 1,995 mL  25 mL/kg Intravenous Once Danford Bad, NP       Followed by  . 0.9 %  sodium chloride infusion   Intravenous Continuous Danford Bad, NP      . cefTRIAXone (ROCEPHIN) 1 g in dextrose 5 % 50 mL IVPB  1 g Intravenous Once Dayton Bailiff, MD   1 g at 05/07/11 0130  . dexamethasone (DECADRON) injection 4 mg  4 mg Intravenous Once April K Palumbo-Rasch, MD   4 mg at 05/06/11 7829  . dextrose 10 % infusion   Intravenous Continuous Danford Bad, NP      . sodium chloride 0.9 % bolus 500 mL  500 mL Intravenous Once Danford Bad, NP       And  . sodium chloride 0.9 % bolus 500 mL  500 mL Intravenous Q30 min PRN Danford Bad, NP       And  . norepinephrine (LEVOPHED) 4 mg in dextrose 5 % 250 mL infusion  2-30 mcg/min Intravenous PRN Danford Bad, NP       And  . DOBUTamine (DOBUTREX) infusion  4000 mcg/mL  2.5-10 mcg/kg/min Intravenous PRN Danford Bad, NP      . ertapenem [facility]) 1 g in sodium chloride 0.9 % 50 mL IVPB  1 g Intravenous Once Dayton Bailiff, MD   1 g at 05/07/11 0315  . fentaNYL (SUBLIMAZE) injection 25-50 mcg  25-50 mcg Intravenous Q2H PRN Danford Bad, NP      . hydrocortisone sodium succinate (SOLU-CORTEF) injection 50 mg  50 mg Intravenous Q6H Danford Bad, NP      . HYDROmorphone (DILAUDID) injection 0.5 mg  0.5 mg Intravenous Q4H PRN Dayton Bailiff, MD   0.5 mg at 05/07/11 0400  . insulin aspart (novoLOG) injection 0-4 Units  0-4 Units Subcutaneous Q4H Danford Bad, NP      . iohexol (OMNIPAQUE) 300 MG/ML injection 100 mL  100 mL Intravenous Once PRN Medication Radiologist   100 mL at 05/07/11 0223  . ketorolac (TORADOL) 30 MG/ML injection 30 mg  30 mg Intravenous Once Vida Roller, MD   30 mg at 05/05/11 [phone]  . ketorolac (TORADOL) injection 60 mg  60 mg Intramuscular Once April K Palumbo-Rasch, MD   60 mg at 05/06/11 0525  . micafungin (MYCAMINE) 100 mg in sodium chloride 0.9 % 100 mL IVPB  100 mg Intravenous Q24H Severiano Gilbert, [facility]      . morphine 4 MG/ML injection 4 mg  4 mg Intravenous Once Dayton Bailiff, MD   4 mg at 05/06/11 2345  . morphine 4 MG/ML injection 4 mg  4 mg Intravenous Once Dayton Bailiff, MD   4 mg at 05/07/11 3086  . morphine 4 MG/ML injection 4 mg  4 mg Intravenous Once Dayton Bailiff, MD   4 mg at 05/07/11 [phone]  . ondansetron (ZOFRAN) injection 4 mg  4 mg Intravenous Once Dayton Bailiff, MD   4 mg at 05/06/11 2345  . ondansetron (ZOFRAN) injection 4 mg  4 mg Intravenous Q8H PRN Dayton Bailiff, MD      . ondansetron (ZOFRAN-ODT) disintegrating tablet 4 mg  4 mg Oral Once Vida Roller, MD   4 mg at 05/05/11 [phone]  . oxyCODONE-acetaminophen (PERCOCET) 5-325 MG per tablet 1 tablet  1 tablet Oral Once April K Palumbo-Rasch, MD   1 tablet at 05/06/11 [phone]  . pantoprazole (PROTONIX) 80 mg in sodium chloride 0.9 % 250 mL infusion  8  mg/hr Intravenous Continuous Danford Bad, NP      .  sodium chloride 0.45 % 1,000 mL with sodium bicarbonate 100 mEq infusion   Intravenous Continuous Danford Bad, NP      . sodium chloride 0.9 % bolus 1,000 mL  1,000 mL Intravenous Once Dayton Bailiff, MD   1,000 mL at 05/07/11 0045  . sodium chloride 0.9 % bolus 1,000 mL  1,000 mL Intravenous Once Dayton Bailiff, MD   1,000 mL at 05/07/11 0130  . sodium chloride 0.9 % bolus 1,000 mL  1,000 mL Intravenous Once Dayton Bailiff, MD   1,000 mL at 05/07/11 0644  . sodium chloride 0.9 % bolus 1,000 mL  1,000 mL Intravenous Once Dayton Bailiff, MD   1,000 mL at 05/07/11 0615  . sodium chloride 0.9 % bolus 500 mL  500 mL Intravenous STAT Vida Roller, MD      . sodium chloride 0.9 % bolus 500 mL  500 mL Intravenous Once Danford Bad, NP   500 mL at 05/07/11 1001  . vasopressin (PITRESSIN) 50 Units in sodium chloride 0.9 % 250 mL infusion  0.03 Units/min Intravenous Continuous PRN Danford Bad, NP      . DISCONTD: micafungin (MYCAMINE) 150 mg in sodium chloride 0.9 % 100 mL IVPB  150 mg Intravenous Daily Danford Bad, NP       Medications Prior to Admission  Medication Sig Dispense Refill  . acetaminophen (TYLENOL) 500 MG tablet Take 1 tablet (500 mg total) by mouth 3 (three) times daily as needed for pain.  60 tablet  0  . amLODipine (NORVASC) 5 MG tablet Take 1 tablet (5 mg total) by mouth daily.  30 tablet  3  . butalbital-acetaminophen-caffeine (FIORICET) 50-325-40 MG per tablet One tablet twice daily as needed for severe headache  40 tablet  2  . butalbital-acetaminophen-caffeine (FIORICET) 50-325-40 MG per tablet Take 1-2 tablets by mouth every 8 (eight) hours as needed for headache.  20 tablet  0  . calcium-vitamin D (OSCAL WITH D) 500-200 MG-UNIT per tablet Take 1 tablet by mouth 2 (two) times daily.       . ENABLEX 15 MG 24 hr tablet TAKE 1 TABLET BY MOUTH ONCE A DAY FOR BLADDER.  30 each  3  . Ensure Plus (ENSURE PLUS) LIQD  Take 1 Can by mouth daily.  30 Can  2  . esomeprazole (NEXIUM) 40 MG capsule Take 40 mg by mouth daily.       . fish oil-omega-3 fatty acids 1000 MG capsule Take 1 g by mouth daily. OTC        . glipiZIDE (GLUCOTROL) 10 MG tablet Take 1 tablet (10 mg total) by mouth daily. DO NOT TAKE THIS MEDICINE IF YOUR BLOOD SUGAR IS LESS THAN 130.  30 tablet  3  . HYDROcodone-acetaminophen (NORCO) 5-325 MG per tablet Take 1-2 tablets by mouth every 6 (six) hours as needed for pain.  10 tablet  0  . HYDROcodone-acetaminophen (NORCO) 5-325 MG per tablet Take 1 tablet by mouth every 4 (four) hours as needed for pain.  20 tablet  0  . LOTENSIN 20 MG tablet TAKE ONE TABLET DAILY.  30 each  3  . metFORMIN (GLUCOPHAGE) 1000 MG tablet TAKE ONE TABLET BY MOUTH TWICE DAILY WITH FOOD FORDIABETES.  60 tablet  3  . Multiple Vitamin (MULTIVITAMIN) tablet Take 1 tablet by mouth daily.        . ondansetron (ZOFRAN ODT) 4 MG disintegrating tablet Take 1 tablet (4 mg total) by mouth every 8 (eight) hours as needed for nausea.  10 tablet  0  . ondansetron (ZOFRAN) 4 MG tablet Take 1 tablet (4 mg total) by mouth every 6 (six) hours.  12 tablet  0  . polyethylene glycol (MIRALAX / GLYCOLAX) packet ADD TO WATER DAILY FOR CONSTIPATION.  14 each    . potassium chloride SA (K-DUR,KLOR-CON) 20 MEQ tablet Take 1 tablet (20 mEq total) by mouth daily.  30 tablet  1  . QUEtiapine (SEROQUEL) 25 MG tablet Take 25 mg by mouth 2 (two) times daily as needed. FOR SLEEP          Results for orders placed during the hospital encounter of 05/06/11 (from the past 48 hour(s))  GLUCOSE, CAPILLARY     Status: Abnormal   Collection Time   05/06/11 11:25 PM      Component Value Range Comment   Glucose-Capillary 307 (*) 70 - 99 (mg/dL)   CBC     Status: Abnormal   Collection Time   05/06/11 11:36 PM      Component Value Range Comment   WBC 6.3  4.0 - 10.5 (K/uL)    RBC 4.55  3.87 - 5.11 (MIL/uL)    Hemoglobin 12.0  12.0 - 15.0 (g/dL)    HCT  09.6 (*) 04.5 - 46.0 (%)    MCV 77.4 (*) 78.0 - 100.0 (fL)    MCH 26.4  26.0 - 34.0 (pg)    MCHC 34.1  30.0 - 36.0 (g/dL)    RDW 40.9 (*) 81.1 - 15.5 (%)    Platelets 535 (*) 150 - 400 (K/uL)   DIFFERENTIAL     Status: Abnormal   Collection Time   05/06/11 11:36 PM      Component Value Range Comment   Neutrophils Relative 83 (*) 43 - 77 (%)    Lymphocytes Relative 13  12 - 46 (%)    Monocytes Relative 4  3 - 12 (%)    Eosinophils Relative 0  0 - 5 (%)    Basophils Relative 0  0 - 1 (%)    Neutro Abs 5.2  1.7 - 7.7 (K/uL)    Lymphs Abs 0.8  0.7 - 4.0 (K/uL)    Monocytes Absolute 0.3  0.1 - 1.0 (K/uL)    Eosinophils Absolute 0.0  0.0 - 0.7 (K/uL)    Basophils Absolute 0.0  0.0 - 0.1 (K/uL)   COMPREHENSIVE METABOLIC PANEL     Status: Abnormal   Collection Time   05/06/11 11:36 PM      Component Value Range Comment   Sodium 115 (*) 135 - 145 (mEq/L)    Potassium 3.7  3.5 - 5.1 (mEq/L)    Chloride 86 (*) 96 - 112 (mEq/L) DELTA CHECK NOTED   CO2 10 (*) 19 - 32 (mEq/L)    Glucose, Bld 342 (*) 70 - 99 (mg/dL)    BUN 10  6 - 23 (mg/dL)    Creatinine, Ser 9.14  0.50 - 1.10 (mg/dL)    Calcium 8.2 (*) 8.4 - 10.5 (mg/dL)    Total Protein 6.3  6.0 - 8.3 (g/dL)    Albumin 2.6 (*) 3.5 - 5.2 (g/dL)    AST 35  0 - 37 (U/L)    ALT 37 (*) 0 - 35 (U/L)    Alkaline Phosphatase 101  39 - 117 (U/L)    Total Bilirubin 0.4  0.3 - 1.2 (mg/dL)    GFR calc non Af Amer 86 (*) >90 (mL/min)    GFR calc Af Amer >90  >90 (  mL/min)   LIPASE, BLOOD     Status: Abnormal   Collection Time   05/06/11 11:36 PM      Component Value Range Comment   Lipase 63 (*) 11 - 59 (U/L)   LACTIC ACID, PLASMA     Status: Abnormal   Collection Time   05/06/11 11:37 PM      Component Value Range Comment   Lactic Acid, Venous 2.8 (*) 0.5 - 2.2 (mmol/L)   URINALYSIS, ROUTINE W REFLEX MICROSCOPIC     Status: Abnormal   Collection Time   05/07/11 12:09 AM      Component Value Range Comment   Color, Urine AMBER (*) YELLOW   BIOCHEMICALS MAY BE AFFECTED BY COLOR   Appearance HAZY (*) CLEAR     Specific Gravity, Urine >1.030 (*) 1.005 - 1.030     pH 5.0  5.0 - 8.0     Glucose, UA NEGATIVE  NEGATIVE (mg/dL)    Hgb urine dipstick TRACE (*) NEGATIVE     Bilirubin Urine NEGATIVE  NEGATIVE     Ketones, ur 15 (*) NEGATIVE (mg/dL)    Protein, ur 30 (*) NEGATIVE (mg/dL)    Urobilinogen, UA 0.2  0.0 - 1.0 (mg/dL)    Nitrite NEGATIVE  NEGATIVE     Leukocytes, UA NEGATIVE  NEGATIVE    URINE MICROSCOPIC-ADD ON     Status: Abnormal   Collection Time   05/07/11 12:09 AM      Component Value Range Comment   Squamous Epithelial / LPF RARE  RARE     WBC, UA 7-10  <3 (WBC/hpf)    RBC / HPF 0-2  <3 (RBC/hpf)    Bacteria, UA MANY (*) RARE    GLUCOSE, CAPILLARY     Status: Abnormal   Collection Time   05/07/11  7:58 AM      Component Value Range Comment   Glucose-Capillary 169 (*) 70 - 99 (mg/dL)   BLOOD GAS, ARTERIAL     Status: Abnormal (Preliminary result)   Collection Time   05/07/11 10:12 AM      Component Value Range Comment   FIO2 PENDING      O2 Content 2.0      Delivery systems NASAL CANNULA      pH, Arterial 7.305 (*) 7.350 - 7.400     pCO2 arterial 21.0 (*) 35.0 - 45.0 (mmHg)    pO2, Arterial 73.9 (*) 80.0 - 100.0 (mmHg)    Bicarbonate 10.2 (*) 20.0 - 24.0 (mEq/L)    TCO2 10.9  0 - 100 (mmol/L)    Acid-base deficit 15.1 (*) 0.0 - 2.0 (mmol/L)    O2 Saturation 93.7      Patient temperature 97.7      Collection site RADIAL      Drawn by [id number]      Sample type ARTERIAL      Allens test (pass/fail) PASS  PASS     Ct Abdomen Pelvis W Contrast  05/07/2011  *RADIOLOGY REPORT*  Clinical Data: Abdominal pain  CT ABDOMEN AND PELVIS WITH CONTRAST  Technique:  Multidetector CT imaging of the abdomen and pelvis was performed following the standard protocol during bolus administration of intravenous contrast.  Contrast: OMNIPAQUE IOHEXOL 300 MG/ML IV SOLN  Comparison: 02/21/2011  Findings: Small bilateral  pleural effusions.  Associate consolidations may reflect atelectasis or infiltrate. Cardiomegaly.  Small pericardial effusion.  Coronary artery calcification. Distal esophageal wall thickening.  There is a moderate amount of free intraperitoneal air and fluid.  Layering high attenuation is in keeping with extravasated ingested contrast.  Focal outpouching at the gastroduodenal junction and mucosal hyperenhancement of the duodenum suggest a perforated gastric antral or proximal duodenal ulcer or leak anastomotic site as suture material is noted in this location as well.  Unremarkable liver, biliary system, spleen, pancreas, adrenal glands.  There are a couple renal hypodensities are too small to further characterize.  No hydronephrosis or hydroureter.  Decompressed bladder by a Foley catheter balloon.  Colonic diverticulosis without CT evidence for colitis.  No bowel obstruction.  Punctate calcifications within the uterus may reflect fibroids.  Streak artifact from the patient's right hip arthroplasty significantly limits evaluation of the pelvis.  There is scattered atherosclerotic calcification of the aorta and its branches. No aneurysmal dilatation.  Multilevel degenerative changes of the imaged spine. No acute or aggressive appearing osseous lesion. .  IMPRESSION: Free intraperitoneal air and a moderate large amount of free intraperitoneal fluid.  Findings are in keeping with a perforation, either an ulcer or at a prior surgical site.  Distal esophageal wall thickening may reflect gastroesophageal reflux disease or esophagitis.  Bibasilar opacities may reflect atelectasis or aspiration.  Critical Value/emergent results were called by telephone at the time of interpretation on 05/07/2011  at 02:50 a.m.  to  Dr. Brooke Dare, who verbally acknowledged these results.  Original Report Authenticated By: Waneta Martins, M.D.    Review of Systems  Unable to perform ROS: mental status change  :  See HPI no new c/o since  HPI  Blood pressure 103/77, pulse 132, temperature 97.7 F (36.5 C), temperature source Oral, resp. rate 31, height 5' 6.5" (1.689 m), weight 175 lb 14.8 oz (79.8 kg), SpO2 97.00%. Physical Exam  Constitutional: She appears listless. She appears distressed.  HENT:  Head: Normocephalic and atraumatic.  Eyes: Pupils are equal, round, and reactive to light.  Neck: Normal range of motion. Neck supple.  Cardiovascular: Tachycardia present.  Exam reveals no decreased pulses.   Respiratory: She is in respiratory distress. She has no wheezes. She has no rales. She exhibits no tenderness.  GI: Soft. There is tenderness. There is rebound and guarding.  Genitourinary: No vaginal discharge found.  Musculoskeletal: Normal range of motion. She exhibits edema. She exhibits no tenderness.  Neurological: She appears listless. She displays normal reflexes. No cranial nerve deficit. She exhibits normal muscle tone.  Skin: Skin is intact. Bruising noted. No rash noted. She is diaphoretic. No cyanosis. No pallor. Nails show no clubbing.  Psychiatric: Her speech is normal.     Assessment/Plan   Patient Active Problem List  Diagnoses  . DIABETES MELLITUS, TYPE II  . HYPERLIPIDEMIA  . Obesity, unspecified  . DEPRESSION  . HYPERTENSION  . SINUSITIS, ACUTE  . ALLERGIC RHINITIS, SEASONAL  . OTHER CHRONIC BRONCHITIS  . DIVERTICULOSIS OF COLON  . ACUTE CYSTITIS  . INSOMNIA  . FATIGUE  . UNSTEADY GAIT  . HEADACHE  . Other urinary incontinence  . CLOSED FRACTURE OF OLECRANON PROCESS OF ULNA  . HEMATOMA  . HEADACHES, HX OF  . Anorexia  . Anemia  . Syncope and collapse  . Hepatic steatosis  . Hyponatremia  . Gastric mass  . H pylori ulcer  . Ulcer, colon  . Acute renal failure  . S/P exploratory laparotomy  . Pericardial effusion  . Fever, postprocedural  . Confusion  . Leucocytosis  . Sepsis  . Abdominal pain, acute  . Metabolic acidosis  . Esophagitis, erosive   Impression:   1.  Free Air in abd with probable gastric perforation and peritonitis. 2. Recent exploratory lap, and FNA bx of pancreas  Plan:    -Pt being intubated -Shock stabilization -Emergent Exploratory laparatomy by [name].  The anatomy & physiology of the digestive tract was discussed.  The pathophysiology of perforation was discussed.  Natural history risks without surgery such as death was discussed.  I recommended abdominal exploration to diagnose & treat the source of the problem.  Laparoscopic & open techniques were discussed.   I expressed a good likelihood that surgery will address the problem.  Risks such as bleeding, infection, abscess, leak, reoperation, bowel resection, possible ostomy, hernia, heart attack, death, and other risks were discussed.   The risks of no intervention will lead to serious problems include a high likelihood of death.     Goals of post-operative recovery were discussed as well.  We will work to minimize complications.  I called the patient's stepdaughter as well & explained the need for emergent surgery.   Questions were answered.  The patient & stepdaughter express understanding & wishes to proceed with surgery.    Central & arterial lines -Foley -Hold on anticoagulation pre-op    JENNINGS,WILLARD 05/07/2011, 10:34 AM  Ardeth Sportsman, M.D., F.A.C.S. Gastrointestinal and Minimally Invasive Surgery [facility] [address], [address] [place], [place] [address] [phone] Main / Paging [phone] Voice Mail

## 2011-05-07 NOTE — ED Notes (Signed)
Patient medicated for abdominal pain rated 8/10.

## 2011-05-07 NOTE — Plan of Care (Signed)
Problem: Phase I Progression Outcomes Goal: Pneumonia/flu vaccination screen completed Outcome: Completed/Met Date Met:  05/07/11 Flu Vaccine 04/21/2011 Pneumonia Vaccine 04/14/2003 (After age of 14)

## 2011-05-07 NOTE — Progress Notes (Signed)
CRITICAL VALUE ALERT  Critical value received:  Calcium 6.4  Date of notification: 05/07/2011    Time of notification:  20:50  Critical value read back:yes  Nurse who received alert:  Tammy Sours RN  MD notified (1st page):  Ramaswamy  Time of first page: 20:54 MD notified (2nd page):  Time of second page:  Responding MD:  Marchelle Gearing  Time MD responded: 20:54 Sol Passer 9:05 PM

## 2011-05-07 NOTE — ED Notes (Signed)
Dr. Bebe Shaggy notified of pt's critical labs of CO2 of 10 and Na+ 115.

## 2011-05-07 NOTE — ED Provider Notes (Signed)
History     CSN: [id number] Arrival date & time: 05/06/2011 11:07 PM   First MD Initiated Contact with Patient 05/06/11 2322      Chief Complaint  Patient presents with  . Headache    (Consider location/radiation/quality/duration/timing/severity/associated sxs/prior treatment) HPI Comments: Patient also complains of an acute on chronic headache. She has a history of pancreatitis. States the headache is typical for prior she has had. She has no associated visual changes or neurologic symptoms. She has been seen here twice in the past 2-3 days and was discharged home. During her last emergency department visits she was evaluated by the hospitalists and was eventually discharged home.  Patient is a 77 y.o. female presenting with abdominal pain. The history is provided by the patient. No language interpreter was used.  Abdominal Pain The primary symptoms of the illness include abdominal pain, fatigue and nausea. The primary symptoms of the illness do not include fever, shortness of breath, vomiting or dysuria. The current episode started 2 days ago. The onset of the illness was gradual. The problem has been rapidly worsening.  The abdominal pain began yesterday. The pain came on gradually. The abdominal pain has been rapidly worsening since its onset. The abdominal pain is generalized. The abdominal pain does not radiate. The abdominal pain is relieved by nothing. The abdominal pain is exacerbated by movement and certain positions.  Nausea began today. The nausea is exacerbated by motion.  The patient states that she believes she is currently not pregnant. The patient has not had a change in bowel habit. Risk factors for an acute abdominal problem include being elderly. Additional symptoms associated with the illness include anorexia. Symptoms associated with the illness do not include diaphoresis, constipation, urgency, frequency or back pain. Significant associated medical issues include PUD and  diabetes.    Past Medical History  Diagnosis Date  . Hypertension   . Reflux   . Right elbow pain     OTIF  . Dementia   . Depression   . Osteoarthritis   . Pancreatitis 11/2007    HOP  . Angiomyolipoma of kidney     right  . Ulcerative esophagitis 12/05/2007    hx elevated gastrin, severe on EGD by Dr Jena Gauss , h pylori negative  . Hiatal hernia   . S/P colonoscopy 2009    pt reports normal by Dr Lovell Sheehan  . Diabetes mellitus   . Anemia   . Hyponatremia     Past Surgical History  Procedure Date  . Orif right hip 1999    [facility]  . Umbilical hernia repair 27 months old    [place]  . Esophagogastroduodenoscopy 01/28/2011    Procedure: ESOPHAGOGASTRODUODENOSCOPY (EGD);  Surgeon: Arlyce Harman, MD;  Location: [facility];  Service: Endoscopy;  Laterality: N/A;  . Colonoscopy 01/28/2011    Procedure: COLONOSCOPY;  Surgeon: Arlyce Harman, MD;  Location: [facility];  Service: Endoscopy;  Laterality: N/A;  . Laparotomy 02/04/2011    Procedure: EXPLORATORY LAPAROTOMY;  Surgeon: Fabio Bering;  Location: [facility];  Service: General;  Laterality: N/A;    Family History  Problem Relation Age of Onset  . Cancer Mother     pelvic     History  Substance Use Topics  . Smoking status: Never Smoker   . Smokeless tobacco: Current User    Types: Chew  . Alcohol Use: No     Hx of Alcohol dependecy     OB History    Grav Para  Term Preterm Abortions TAB SAB Ect Mult Living                  Review of Systems  Constitutional: Positive for activity change and fatigue. Negative for fever, diaphoresis and appetite change.  HENT: Negative for congestion, sore throat, rhinorrhea, neck pain and neck stiffness.   Respiratory: Negative for cough and shortness of breath.   Cardiovascular: Negative for chest pain and palpitations.  Gastrointestinal: Positive for nausea, abdominal pain and anorexia. Negative for vomiting and constipation.  Genitourinary: Negative for dysuria, urgency and  frequency.  Musculoskeletal: Negative for back pain.  Neurological: Positive for headaches. Negative for dizziness, weakness, light-headedness and numbness.  All other systems reviewed and are negative.    Allergies  Other and Penicillins  Home Medications   Current Outpatient Rx  Name Route Sig Dispense Refill  . ACETAMINOPHEN 500 MG PO TABS Oral Take 1 tablet (500 mg total) by mouth 3 (three) times daily as needed for pain. 60 tablet 0  . AMLODIPINE BESYLATE 5 MG PO TABS Oral Take 1 tablet (5 mg total) by mouth daily. 30 tablet 3  . BUTALBITAL-APAP-CAFFEINE 50-325-40 MG PO TABS  One tablet twice daily as needed for severe headache 40 tablet 2  . BUTALBITAL-APAP-CAFFEINE 50-325-40 MG PO TABS Oral Take 1-2 tablets by mouth every 8 (eight) hours as needed for headache. 20 tablet 0  . CALCIUM CARBONATE-VITAMIN D 500-200 MG-UNIT PO TABS Oral Take 1 tablet by mouth 2 (two) times daily.     . ENABLEX 15 MG PO TB24  TAKE 1 TABLET BY MOUTH ONCE A DAY FOR BLADDER. 30 each 3  . ENSURE PLUS PO LIQD Oral Take 1 Can by mouth daily. 30 Can 2  . ESOMEPRAZOLE MAGNESIUM 40 MG PO CPDR Oral Take 40 mg by mouth daily.     . OMEGA-3 FATTY ACIDS 1000 MG PO CAPS Oral Take 1 g by mouth daily. OTC      . GLIPIZIDE 10 MG PO TABS Oral Take 1 tablet (10 mg total) by mouth daily. DO NOT TAKE THIS MEDICINE IF YOUR BLOOD SUGAR IS LESS THAN 130. 30 tablet 3  . HYDROCODONE-ACETAMINOPHEN 5-325 MG PO TABS Oral Take 1-2 tablets by mouth every 6 (six) hours as needed for pain. 10 tablet 0  . HYDROCODONE-ACETAMINOPHEN 5-325 MG PO TABS Oral Take 1 tablet by mouth every 4 (four) hours as needed for pain. 20 tablet 0  . LOTENSIN 20 MG PO TABS  TAKE ONE TABLET DAILY. 30 each 3  . METFORMIN HCL 1000 MG PO TABS  TAKE ONE TABLET BY MOUTH TWICE DAILY WITH FOOD FORDIABETES. 60 tablet 3  . ONE-DAILY MULTI VITAMINS PO TABS Oral Take 1 tablet by mouth daily.      Marland Kitchen ONDANSETRON 4 MG PO TBDP Oral Take 1 tablet (4 mg total) by mouth  every 8 (eight) hours as needed for nausea. 10 tablet 0  . ONDANSETRON HCL 4 MG PO TABS Oral Take 1 tablet (4 mg total) by mouth every 6 (six) hours. 12 tablet 0  . POLYETHYLENE GLYCOL 3350 PO PACK  ADD TO WATER DAILY FOR CONSTIPATION. 14 each   . POTASSIUM CHLORIDE CRYS CR 20 MEQ PO TBCR Oral Take 1 tablet (20 mEq total) by mouth daily. 30 tablet 1  . QUETIAPINE FUMARATE 25 MG PO TABS Oral Take 25 mg by mouth 2 (two) times daily as needed. FOR SLEEP        BP 101/85  Pulse 130  Temp(Src)  97.5 F (36.4 C) (Oral)  Resp 20  SpO2 98%  Physical Exam  Nursing note and vitals reviewed. Constitutional: She is oriented to person, place, and time. She appears well-developed and well-nourished. She appears distressed.  HENT:  Head: Normocephalic and atraumatic.  Mouth/Throat: Oropharynx is clear and moist.  Eyes: Conjunctivae and EOM are normal. Pupils are equal, round, and reactive to light.  Neck: Normal range of motion. Neck supple.  Cardiovascular: Regular rhythm, normal heart sounds and intact distal pulses.  Exam reveals no gallop and no friction rub.   No murmur heard.      Tachycardic  Pulmonary/Chest: Effort normal and breath sounds normal. No respiratory distress. She has no wheezes. She has no rales.  Abdominal: She exhibits distension. There is tenderness (diffuse). There is guarding. There is no rebound.  Musculoskeletal: Normal range of motion. She exhibits no tenderness.  Neurological: She is alert and oriented to person, place, and time.  Skin: Skin is warm and dry. No rash noted.    ED Course  Procedures (including critical care time)  CRITICAL CARE Performed by: [place] Bailiff  ?  Total critical care time: 45 minutes  Critical care time was exclusive of separately billable procedures and treating other patients.  Critical care was necessary to treat or prevent imminent or life-threatening deterioration.  Critical care was time spent personally by me on the  following activities: development of treatment plan with patient and/or surrogate as well as nursing, discussions with consultants, evaluation of patient's response to treatment, examination of patient, obtaining history from patient or surrogate, ordering and performing treatments and interventions, ordering and review of laboratory studies, ordering and review of radiographic studies, pulse oximetry and re-evaluation of patient's condition.  Labs Reviewed  GLUCOSE, CAPILLARY - Abnormal; Notable for the following:    Glucose-Capillary 307 (*)    All other components within normal limits  CBC - Abnormal; Notable for the following:    HCT 35.2 (*)    MCV 77.4 (*)    RDW 21.0 (*)    Platelets 535 (*)    All other components within normal limits  DIFFERENTIAL - Abnormal; Notable for the following:    Neutrophils Relative 83 (*)    All other components within normal limits  COMPREHENSIVE METABOLIC PANEL - Abnormal; Notable for the following:    Sodium 115 (*)    Chloride 86 (*) DELTA CHECK NOTED   CO2 10 (*)    Glucose, Bld 342 (*)    Calcium 8.2 (*)    Albumin 2.6 (*)    ALT 37 (*)    GFR calc non Af Amer 86 (*)    All other components within normal limits  LIPASE, BLOOD - Abnormal; Notable for the following:    Lipase 63 (*)    All other components within normal limits  URINALYSIS, ROUTINE W REFLEX MICROSCOPIC - Abnormal; Notable for the following:    Color, Urine AMBER (*) BIOCHEMICALS MAY BE AFFECTED BY COLOR   Appearance HAZY (*)    Specific Gravity, Urine >1.030 (*)    Hgb urine dipstick TRACE (*)    Ketones, ur 15 (*)    Protein, ur 30 (*)    All other components within normal limits  LACTIC ACID, PLASMA - Abnormal; Notable for the following:    Lactic Acid, Venous 2.8 (*)    All other components within normal limits  URINE MICROSCOPIC-ADD ON - Abnormal; Notable for the following:    Bacteria, UA MANY (*)  All other components within normal limits   Ct Abdomen Pelvis W  Contrast  05/07/2011  *RADIOLOGY REPORT*  Clinical Data: Abdominal pain  CT ABDOMEN AND PELVIS WITH CONTRAST  Technique:  Multidetector CT imaging of the abdomen and pelvis was performed following the standard protocol during bolus administration of intravenous contrast.  Contrast: OMNIPAQUE IOHEXOL 300 MG/ML IV SOLN  Comparison: 02/21/2011  Findings: Small bilateral pleural effusions.  Associate consolidations may reflect atelectasis or infiltrate. Cardiomegaly.  Small pericardial effusion.  Coronary artery calcification. Distal esophageal wall thickening.  There is a moderate amount of free intraperitoneal air and fluid. Layering high attenuation is in keeping with extravasated ingested contrast.  Focal outpouching at the gastroduodenal junction and mucosal hyperenhancement of the duodenum suggest a perforated gastric antral or proximal duodenal ulcer or leak anastomotic site as suture material is noted in this location as well.  Unremarkable liver, biliary system, spleen, pancreas, adrenal glands.  There are a couple renal hypodensities are too small to further characterize.  No hydronephrosis or hydroureter.  Decompressed bladder by a Foley catheter balloon.  Colonic diverticulosis without CT evidence for colitis.  No bowel obstruction.  Punctate calcifications within the uterus may reflect fibroids.  Streak artifact from the patient's right hip arthroplasty significantly limits evaluation of the pelvis.  There is scattered atherosclerotic calcification of the aorta and its branches. No aneurysmal dilatation.  Multilevel degenerative changes of the imaged spine. No acute or aggressive appearing osseous lesion. .  IMPRESSION: Free intraperitoneal air and a moderate large amount of free intraperitoneal fluid.  Findings are in keeping with a perforation, either an ulcer or at a prior surgical site.  Distal esophageal wall thickening may reflect gastroesophageal reflux disease or esophagitis.  Bibasilar  opacities may reflect atelectasis or aspiration.  Critical Value/emergent results were called by telephone at the time of interpretation on 05/07/2011  at 02:50 a.m.  to  Dr. Brooke Dare, who verbally acknowledged these results.  Original Report Authenticated By: Waneta Martins, M.D.     1. Hyponatremia   2. Hypochloremia   3. UTI (lower urinary tract infection)   4. Dehydration   5. Pancreatitis   6. Perforated ulcer       MDM  Patient arrived with multiple complaints including an acute on chronic headache as well as abdominal pain. She does have a history of pancreatitis however on examination of her abdomen she is in significant pain. Laboratory studies were performed as well as a CAT scan of her abdomen pelvis. She is found to be profoundly hyponatremic, hypochloremic, acidotic. She'll lactate of 2.8. Her lipase was elevated likely secondary to the perforated ulcer found on her CAT scan. I discussed the case the hospitalist hand surgeon here at any pen it was decided that she was going to require transfer. I discussed the case with the triad hospitalist at [facility] who states that she is more appropriate for critical care medicine. I discussed the case with Dr. Darrick Penna who accepted the patient. I administered a dose of Invanz given the perforated ulcer. She received multiple liters of IV fluids as well as multiple doses of pain medication for her tachycardia. She remained stable although tachycardic one emergency department. She'll be transferred to the [facility] cone intensive care unit for further evaluation and treatment. She'll require surgical treatment upper perforated ulcer. I reassess her numerous times during her stay in the emergency department as she was here for several hours while discussing the case with all the various consultants.  [place] Bailiff, MD 05/07/11 [phone]

## 2011-05-07 NOTE — Transfer of Care (Signed)
Immediate Anesthesia Transfer of Care Note  Patient: [name]  Procedure(s) Performed:  EXPLORATORY LAPAROTOMY - lysis of adhesions; GASTROSTOMY - g-tube / j -tube placement; GASTRECTOMY - Partial gastrectomy; CHOLECYSTECTOMY - open  Patient Location: PACU and NICU  Anesthesia Type: General  Level of Consciousness: sedated and Patient remains intubated per anesthesia plan  Airway & Oxygen Therapy: Patient remains intubated per anesthesia plan and Patient placed on Ventilator (see vital sign flow sheet for setting)  Post-op Assessment: Report given to PACU RN and Post -op Vital signs reviewed and stable  Post vital signs: Reviewed and stable  Complications: No apparent anesthesia complications

## 2011-05-07 NOTE — Progress Notes (Signed)
eLink Physician-Brief Progress Note Patient Name: [name] DOB: 1931/11/08 MRN: [id number]  Date of Service  05/07/2011   HPI/Events of Note   Camera care  She has returned from OR. RN has weaned her off levophed. Still on precedex. Tachycardic. RASS -1. Synchronous with vent. Looks stable  eICU Interventions  1. DC precedex. Use fentanyl prn for pain and vent mgmt 2. Check cxr 3. Checka abg 4. Check all labs    Intervention Category Major Interventions: Sepsis - evaluation and management;Hypovolemia - evaluation and treatment with fluids  Garnet Overfield 05/07/2011, 7:17 PM

## 2011-05-07 NOTE — H&P (Signed)
Hospital Admission Note Date: 05/07/2011  Patient name: [name] Medical record number: [id number] Date of birth: 1932-05-01 Age: 77 y.o. Gender: female PCP: [name], MD, MD  Medical Service: PCCM,MD  Brief history: Pt is a 77 y.o. female with hx HTN, DM, anemia and frequent ER admits for HA.  Presented 11/8 to [facility] ER with c/o "usual" headache and abd pain.  Found to have free intraperitoneal air and a moderate large amount of free intraperitoneal fluid and presumed sepsis and tx to [facility].  Lines/tubes Left IJ 11/8>>> ETT 11/8>>> Rt fem aline 11/8>>>  Microbiology/Sepsis markers: BCx2 11/8>>> Urine 11/8>>> Lactate 11/8>>> Pct 11/8>>>  Anti-infectives:  Vanc 11/8>>> Primaxin 11/8>>> Micofungin 11/8>>>  Best Practice/Protocols:  DVT - SCD's PPI - protonix Sepsis protocol 11/8 ICU hyperglycemia protocol 11/8  Consults: Surgery   Studies/events: CT scan - freee air Allergies: Other and Penicillins  Past Medical History  Diagnosis Date  . Hypertension   . Reflux   . Right elbow pain     OTIF  . Dementia   . Depression   . Osteoarthritis   . Pancreatitis 11/2007    [facility]  . Angiomyolipoma of kidney     right  . Ulcerative esophagitis 12/05/2007    hx elevated gastrin, severe on EGD by Dr Jena Gauss , h pylori negative  . Hiatal hernia   . S/P colonoscopy 2009    pt reports normal by Dr Lovell Sheehan  . Diabetes mellitus   . Anemia   . Hyponatremia     Past Surgical History  Procedure Date  . Orif right hip 1999    [facility]  . Umbilical hernia repair 54 months old    [place]  . Esophagogastroduodenoscopy 01/28/2011    Procedure: ESOPHAGOGASTRODUODENOSCOPY (EGD);  Surgeon: Arlyce Harman, MD;  Location: [facility] ENDO SUITE;  Service: Endoscopy;  Laterality: N/A;  . Colonoscopy 01/28/2011    Procedure: COLONOSCOPY;  Surgeon: Arlyce Harman, MD;  Location: [facility] ENDO SUITE;  Service: Endoscopy;  Laterality: N/A;  . Laparotomy 02/04/2011    Procedure: EXPLORATORY LAPAROTOMY;   Surgeon: Fabio Bering;  Location: [facility] ORS;  Service: General;  Laterality: N/A;    Family History  Problem Relation Age of Onset  . Cancer Mother     pelvic     History   Social History  . Marital Status: Widowed    Spouse Name: N/A    Number of Children: 1  . Years of Education: N/A   Occupational History  . retired     Social History Main Topics  . Smoking status: Never Smoker   . Smokeless tobacco: Current User    Types: Chew  . Alcohol Use: No     Hx of Alcohol dependecy   . Drug Use: No  . Sexually Active: No   Other Topics Concern  . Not on file   Social History Narrative  . No narrative on file    Home meds: Prior to Admission medications   Medication Sig Start Date End Date Taking? Authorizing Provider  acetaminophen (TYLENOL) 500 MG tablet Take 1 tablet (500 mg total) by mouth 3 (three) times daily as needed for pain. 04/10/11 04/09/12  [name], MD  amLODipine (NORVASC) 5 MG tablet Take 1 tablet (5 mg total) by mouth daily. 04/29/11   Milinda Antis, MD  butalbital-acetaminophen-caffeine [facility]) [phone] MG per tablet One tablet twice daily as needed for severe headache 04/24/11   [name], MD  butalbital-acetaminophen-caffeine (FIORICET) [phone] MG per tablet Take  1-2 tablets by mouth every 8 (eight) hours as needed for headache. 05/03/11 05/02/12  Felisa Bonier, MD  calcium-vitamin D (OSCAL WITH D) 500-200 MG-UNIT per tablet Take 1 tablet by mouth 2 (two) times daily.     Historical Provider, MD  ENABLEX 15 MG 24 hr tablet TAKE 1 TABLET BY MOUTH ONCE A DAY FOR BLADDER. 04/23/11   [name], MD  Ensure Plus (ENSURE PLUS) LIQD Take 1 Can by mouth daily. 04/16/11   [name], MD  esomeprazole (NEXIUM) 40 MG capsule Take 40 mg by mouth daily.  02/20/11   Elliot Cousin  fish oil-omega-3 fatty acids 1000 MG capsule Take 1 g by mouth daily. OTC      Historical Provider, MD  glipiZIDE (GLUCOTROL) 10 MG tablet Take 1 tablet (10  mg total) by mouth daily. DO NOT TAKE THIS MEDICINE IF YOUR BLOOD SUGAR IS LESS THAN 130. 04/29/11   Milinda Antis, MD  HYDROcodone-acetaminophen [facility]) 5-325 MG per tablet Take 1-2 tablets by mouth every 6 (six) hours as needed for pain. 04/26/11 05/06/11  Shelda Jakes, MD  HYDROcodone-acetaminophen (NORCO) 5-325 MG per tablet Take 1 tablet by mouth every 4 (four) hours as needed for pain. 05/01/11 05/11/11  Donnetta Hutching, MD  LOTENSIN 20 MG tablet TAKE ONE TABLET DAILY. 04/28/11   [name], MD  metFORMIN (GLUCOPHAGE) 1000 MG tablet TAKE ONE TABLET BY MOUTH TWICE DAILY WITH FOOD FORDIABETES. 04/29/11   Milinda Antis, MD  Multiple Vitamin (MULTIVITAMIN) tablet Take 1 tablet by mouth daily.      Historical Provider, MD  ondansetron (ZOFRAN ODT) 4 MG disintegrating tablet Take 1 tablet (4 mg total) by mouth every 8 (eight) hours as needed for nausea. 05/05/11 05/12/11  Vida Roller, MD  ondansetron (ZOFRAN) 4 MG tablet Take 1 tablet (4 mg total) by mouth every 6 (six) hours. 05/01/11 05/08/11  Donnetta Hutching, MD  polyethylene glycol (MIRALAX / Ethelene Hal) packet ADD TO WATER DAILY FOR CONSTIPATION. 02/20/11   Denise Fisher  potassium chloride SA (K-DUR,KLOR-CON) 20 MEQ tablet Take 1 tablet (20 mEq total) by mouth daily. 04/29/11 04/28/12  Milinda Antis, MD  QUEtiapine (SEROQUEL) 25 MG tablet Take 25 mg by mouth 2 (two) times daily as needed. FOR SLEEP   04/21/11 05/21/11  [name], MD       Review of Systems:  ROS difficult, pt drowsy, does not want to answer questions.  Does c/o abd pain x ~24 hours. Denies n/v/d, fevers. Denies SOB, chest pain.     Physical Exam: Patient Vitals for the past 3 hrs:  BP Temp Temp src Pulse Resp SpO2 Height Weight  05/07/11 0930 113/71 mmHg - - 130  36  94 % - -  05/07/11 0915 97/65 mmHg - - 133  35  95 % - -  05/07/11 0900 107/62 mmHg - - 135  34  95 % - -  05/07/11 0845 154/99 mmHg 97.7 F (36.5 C) Oral 133  33  98 % 5' 6.5" (1.689 m) 175 lb  14.8 oz (79.8 kg)  05/07/11 0734 104/70 mmHg - - 128  24  - - -  ]  Intake/Output Summary (Last 24 hours) at 05/07/11 0953 Last data filed at 05/07/11 0900  Gross per 24 hour  Intake    150 ml  Output    640 ml  Net   -490 ml     General: chronically ill appearing, NAD HEENT: mm dry, no jvd, no la Neuro: drowsy, responds to  verbal stimuli, MAE, gen weakness  Lungs: resps even non labored, diminished bases  Cards: s1s2 rrr, no m/r/g Abd: mildly distended, hypoactive bs Ext: warm and dry, no edema     LAB RESULTS Lab Results  Component Value Date   WBC 6.3 05/06/2011   HGB 12.0 05/06/2011   HCT 35.2* 05/06/2011   MCV 77.4* 05/06/2011   PLT 535* 05/06/2011   CMP     Component Value Date/Time   NA 115* 05/06/2011 2336   K 3.7 05/06/2011 2336   CL 86* 05/06/2011 2336   CO2 10* 05/06/2011 2336   GLUCOSE 342* 05/06/2011 2336   BUN 10 05/06/2011 2336   CREATININE 0.59 05/06/2011 2336   CALCIUM 8.2* 05/06/2011 2336   PROT 6.3 05/06/2011 2336   ALBUMIN 2.6* 05/06/2011 2336   AST 35 05/06/2011 2336   ALT 37* 05/06/2011 2336   ALKPHOS 101 05/06/2011 2336   BILITOT 0.4 05/06/2011 2336   GFRNONAA 86* 05/06/2011 2336   GFRAA >90 05/06/2011 2336    ABG ABG    Component Value Date/Time   PHART 7.305* 05/07/2011 1012   HCO3 10.2* 05/07/2011 1012   TCO2 10.9 05/07/2011 1012   ACIDBASEDEF 15.1* 05/07/2011 1012   O2SAT 93.7 05/07/2011 1012       Lab Results  Component Value Date   AMYLASE 57 12/02/2007   Lab Results  Component Value Date   LIPASE 63* 05/06/2011     Radiology: Ct Abdomen Pelvis W Contrast  05/07/2011  *RADIOLOGY REPORT*  Clinical Data: Abdominal pain  CT ABDOMEN AND PELVIS WITH CONTRAST  Technique:  Multidetector CT imaging of the abdomen and pelvis was performed following the standard protocol during bolus administration of intravenous contrast.  Contrast: OMNIPAQUE IOHEXOL 300 MG/ML IV SOLN  Comparison: 02/21/2011  Findings: Small bilateral pleural effusions.   Associate consolidations may reflect atelectasis or infiltrate. Cardiomegaly.  Small pericardial effusion.  Coronary artery calcification. Distal esophageal wall thickening.  There is a moderate amount of free intraperitoneal air and fluid. Layering high attenuation is in keeping with extravasated ingested contrast.  Focal outpouching at the gastroduodenal junction and mucosal hyperenhancement of the duodenum suggest a perforated gastric antral or proximal duodenal ulcer or leak anastomotic site as suture material is noted in this location as well.  Unremarkable liver, biliary system, spleen, pancreas, adrenal glands.  There are a couple renal hypodensities are too small to further characterize.  No hydronephrosis or hydroureter.  Decompressed bladder by a Foley catheter balloon.  Colonic diverticulosis without CT evidence for colitis.  No bowel obstruction.  Punctate calcifications within the uterus may reflect fibroids.  Streak artifact from the patient's right hip arthroplasty significantly limits evaluation of the pelvis.  There is scattered atherosclerotic calcification of the aorta and its branches. No aneurysmal dilatation.  Multilevel degenerative changes of the imaged spine. No acute or aggressive appearing osseous lesion. .  IMPRESSION: Free intraperitoneal air and a moderate large amount of free intraperitoneal fluid.  Findings are in keeping with a perforation, either an ulcer or at a prior surgical site.  Distal esophageal wall thickening may reflect gastroesophageal reflux disease or esophagitis.  Bibasilar opacities may reflect atelectasis or aspiration.  Critical Value/emergent results were called by telephone at the time of interpretation on 05/07/2011  at 02:50 a.m.  to  Dr. Brooke Dare, who verbally acknowledged these results.  Original Report Authenticated By: Waneta Martins, M.D.     Assessment & Plan    1 - Septic shock - abd source  in setting ?perf.  Sepsis not yet severe, lactate mildy  elevated, BP responding to fluids.   PLAN -  Source control - surgery to see  EGDT needed now as now in shock, levo, cvp, svo2 Volume stres roids after cortisol sent abx - see flowsheet , STAT Place cvl for CVP F/u lactate, pct  Requires aline emrgent  2.  Abdominal pain, acute PLAN -  See CT results.  ?perf? Surgery to see, done, to OR ppi drip PRN fent F/u amylase, lipase  npo  3. Metabolic acidosis - mixed non Ag / AG PLAN -  HCO3 gtt - in 1/2 NS r/t hyponatremia Bicarb for NON AG only for now F/u ABG F/u lactate Avoiding d5w with Na bmet q6h  4 Hyponatremia - chronic, unknown baseline.  PLAN -  NS F/u bmet q6h  5 DIABETES MELLITUS, TYPE II PLAN -  ICU hyperglycemia protocol  6 HYPERTENSION  Hold home anti-htn  7 Anemia - chronic PLAN -  F/u cbc    *Spoke with step-daughter Langston Reusing and updated fully re: condition, plan of care.   [phone]  Dirk Dress NP 11/8

## 2011-05-07 NOTE — Procedures (Signed)
Arterial Catheter Insertion Procedure Note [name] [id number] [birth year]  Procedure: Insertion of Arterial Catheter  Indications: Blood pressure monitoring  Procedure Details Consent: Unable to obtain consent because of altered level of consciousness. Time Out: Verified patient identification, verified procedure, site/side was marked, verified correct patient position, special equipment/implants available, medications/allergies/relevent history reviewed, required imaging and test results available.  Performed  Maximum sterile technique was used including cap, gloves and gown. Skin prep: Chlorhexidine; local anesthetic administered 20 gauge catheter was inserted into right femoral artery using the Seldinger technique.  Evaluation Blood flow good; BP tracing good. Complications: No apparent complications.    US guidance shock Nelda Bucks. 05/07/2011

## 2011-05-07 NOTE — Procedures (Signed)
Intubation Procedure Note [name] [id number] 1931-08-13  Procedure: Intubation Indications: Respiratory insufficiency  Procedure Details Consent: Unable to obtain consent because of altered level of consciousness. Time Out: Verified patient identification, verified procedure, site/side was marked, verified correct patient position, special equipment/implants available, medications/allergies/relevent history reviewed, required imaging and test results available.  Performed  Maximum sterile technique was used including gloves, gown, hand hygiene and mask.  MAC    Evaluation Hemodynamic Status: Transient hypotension treated with pressors; O2 sats: transiently fell during during procedure Patient's Current Condition: unstable Complications: No apparent complications Patient did tolerate procedure well. Chest X-ray ordered to verify placement.  CXR: pending  Noted upon evaluating cords, aspiration of stomach contents, suctioned, mild asp noted.   Nelda Bucks 05/07/2011

## 2011-05-08 DIAGNOSIS — K811 Chronic cholecystitis: Secondary | ICD-10-CM

## 2011-05-08 LAB — TYPE AND SCREEN
Antibody Screen: NEGATIVE
Unit division: 0
Unit division: 0

## 2011-05-08 LAB — URINE CULTURE
Culture  Setup Time: 201211081144
Culture: NO GROWTH

## 2011-05-08 LAB — GLUCOSE, CAPILLARY
Glucose-Capillary: 166 mg/dL — ABNORMAL HIGH (ref 70–99)
Glucose-Capillary: 131 mg/dL — ABNORMAL HIGH (ref 70–99)
Glucose-Capillary: 135 mg/dL — ABNORMAL HIGH (ref 70–99)
Glucose-Capillary: 128 mg/dL — ABNORMAL HIGH (ref 70–99)

## 2011-05-08 LAB — BASIC METABOLIC PANEL
BUN: 15 mg/dL (ref 6–23)
BUN: 16 mg/dL (ref 6–23)
CO2: 13 mEq/L — ABNORMAL LOW (ref 19–32)
CO2: 14 mEq/L — ABNORMAL LOW (ref 19–32)
Calcium: 6.9 mg/dL — ABNORMAL LOW (ref 8.4–10.5)
Chloride: 106 mEq/L (ref 96–112)
Chloride: 107 mEq/L (ref 96–112)
Creatinine, Ser: 0.95 mg/dL (ref 0.50–1.10)
GFR calc Af Amer: 64 mL/min — ABNORMAL LOW (ref >90)
Glucose, Bld: 172 mg/dL — ABNORMAL HIGH (ref 70–99)
Potassium: 3.9 mEq/L (ref 3.5–5.1)

## 2011-05-08 LAB — PROTIME-INR: Prothrombin Time: 25.5 seconds — ABNORMAL HIGH (ref 11.6–15.2)

## 2011-05-08 MED ORDER — ACETAMINOPHEN 650 MG RE SUPP
650.0000 mg | Freq: Four times a day (QID) | RECTAL | Status: DC | PRN
  Administered 2011-05-12 – 2011-07-06 (×9): 650 mg via RECTAL
  Filled 2011-05-08 (×9): qty 1

## 2011-05-08 MED ORDER — SODIUM BICARBONATE 8.4 % IV SOLN
INTRAVENOUS | Status: DC
  Administered 2011-05-08 – 2011-05-09 (×2): via INTRAVENOUS
  Filled 2011-05-08 (×5): qty 1000

## 2011-05-08 MED ORDER — NOREPINEPHRINE BITARTRATE 1 MG/ML IJ SOLN
2.0000 ug/min | INTRAVENOUS | Status: DC | PRN
  Filled 2011-05-08: qty 4

## 2011-05-08 MED ORDER — LIP MEDEX EX OINT
1.0000 "application " | TOPICAL_OINTMENT | Freq: Two times a day (BID) | CUTANEOUS | Status: DC
  Administered 2011-05-08 – 2011-07-05 (×107): 1 via TOPICAL
  Filled 2011-05-08 (×5): qty 7

## 2011-05-08 MED ORDER — CHLORHEXIDINE GLUCONATE 0.12 % MT SOLN
15.0000 mL | Freq: Two times a day (BID) | OROMUCOSAL | Status: DC
  Administered 2011-05-08 – 2011-05-18 (×22): 15 mL via OROMUCOSAL
  Filled 2011-05-08 (×36): qty 15

## 2011-05-08 MED ORDER — VITAL 1.5 CAL PO LIQD
1000.0000 mL | ORAL | Status: DC
  Administered 2011-05-08: 1000 mL
  Filled 2011-05-08 (×2): qty 1000

## 2011-05-08 MED ORDER — BIOTENE DRY MOUTH MT LIQD
15.0000 mL | Freq: Two times a day (BID) | OROMUCOSAL | Status: DC
  Administered 2011-05-08 – 2011-05-18 (×22): 15 mL via OROMUCOSAL

## 2011-05-08 MED ORDER — PANTOPRAZOLE SODIUM 40 MG IV SOLR
40.0000 mg | Freq: Two times a day (BID) | INTRAVENOUS | Status: DC
  Administered 2011-05-08 – 2011-06-19 (×86): 40 mg via INTRAVENOUS
  Filled 2011-05-08 (×92): qty 40

## 2011-05-08 MED ORDER — PROPOFOL 10 MG/ML IV EMUL
5.0000 ug/kg/min | INTRAVENOUS | Status: DC
  Administered 2011-05-08: 40 ug/kg/min via INTRAVENOUS
  Administered 2011-05-08: 10 ug/kg/min via INTRAVENOUS
  Administered 2011-05-08 – 2011-05-09 (×6): 40 ug/kg/min via INTRAVENOUS
  Administered 2011-05-10: 0 ug/kg/min via INTRAVENOUS
  Administered 2011-05-10 – 2011-05-11 (×4): 40 ug/kg/min via INTRAVENOUS
  Filled 2011-05-08 (×15): qty 100

## 2011-05-08 MED ORDER — DIPHENHYDRAMINE HCL 50 MG/ML IJ SOLN: 12.5000 mg | Freq: Four times a day (QID) | INTRAMUSCULAR | Status: DC | PRN

## 2011-05-08 MED ORDER — SODIUM CHLORIDE 0.9 % IV SOLN
Freq: Once | INTRAVENOUS | Status: AC
  Administered 2011-05-08: 02:00:00 via INTRAVENOUS

## 2011-05-08 MED ORDER — PROPOFOL 10 MG/ML IV EMUL
INTRAVENOUS | Status: AC
  Administered 2011-05-08: 10 ug/kg/min via INTRAVENOUS
  Filled 2011-05-08: qty 100

## 2011-05-08 MED ORDER — OSMOLITE 1.2 CAL PO LIQD
1000.0000 mL | ORAL | Status: DC
  Administered 2011-05-08: 1000 mL
  Filled 2011-05-08: qty 1000

## 2011-05-08 MED FILL — Etomidate IV Soln 2 MG/ML: INTRAVENOUS | Qty: 10 | Status: AC

## 2011-05-08 NOTE — Plan of Care (Signed)
Problem: Phase I Progression Outcomes Goal: VTE prophylaxis Outcome: Completed/Met Date Met:  05/08/11 SCD's on Goal: GIProphysixis Outcome: Progressing Protonix Drip

## 2011-05-08 NOTE — Anesthesia Postprocedure Evaluation (Signed)
  Anesthesia Post-op Note  Patient: [name]  Procedure(s) Performed:  EXPLORATORY LAPAROTOMY - lysis of adhesions; GASTROSTOMY - g-tube / j -tube placement; GASTRECTOMY - Partial gastrectomy; CHOLECYSTECTOMY - open  Patient Location: ICU  Anesthesia Type: General  Level of Consciousness: sedated and unresponsive  Airway and Oxygen Therapy: Patient remains intubated per anesthesia plan  Post-op Pain: mild  Post-op Assessment: Post-op Vital signs reviewed and Patent Airway  Post-op Vital Signs: Reviewed  Complications: No apparent anesthesia complications

## 2011-05-08 NOTE — Progress Notes (Signed)
[name] is a 77 y.o. female transferred from [facility] on 05/06/2011 with headache and abd pain.  Found to have intraperitoneal free air, and perforated pylorus complicated by septic shock and VDRF. PMHx HTN, DM, GERD, Dementia, Depression, OA, Pancreatitis, Angiomyolipoma Rt kidney, Anemai  Lines/tubes  Left IJ 11/8>>>  ETT 11/8>>>  Rt fem aline 11/8>>>   Microbiology/Sepsis markers:  BCx2 11/8>>>  Urine 11/8>>>  Lactate 11/8>>>  Pct 11/8>>>   Anti-infectives:  Vanc 11/8>>>  Primaxin 11/8>>>  Micofungin 11/8>>>   Best Practice/Protocols:  DVT - SCD's  PPI - protonix  Sepsis protocol 11/8  ICU hyperglycemia protocol 11/8   Consults:  Surgery   Studies/events:  11/7: CT scan>>free air 11/7: Ex lap for perforated pyloric ulcer   SUBJECTIVE:  Remains on pressors, decreased urine outpt, agitated with WUA.  OBJECTIVE:  Blood pressure 145/54, pulse 44, temperature 99.9 F (37.7 C), temperature source Axillary, resp. rate 22, height 5' 6.5" (1.689 m), weight 197 lb 15.6 oz (89.8 kg), SpO2 100.00%.   Intake/Output Summary (Last 24 hours) at 05/08/11 0924 Last data filed at 05/08/11 0900  Gross per 24 hour  Intake 11773.38 ml  Output   1165 ml  Net 10608.38 ml   PHYSICAL EXAM: General - ill appearing HEENT - ETT in place Cardiac - irregular Chest - scattered rhonchi Abd - wound dressing clean Ext - 1+ edema Neuro - sedated  Lab Results  Component Value Date   WBC 3.1* 05/07/2011   HGB 11.2* 05/07/2011   HCT 33.2* 05/07/2011   MCV 77.9* 05/07/2011   PLT 326 05/07/2011   CMP     Component Value Date/Time   NA 134* 05/08/2011 0515   K 3.9 05/08/2011 0515   CL 107 05/08/2011 0515   CO2 14* 05/08/2011 0515   GLUCOSE 184* 05/08/2011 0515   BUN 16 05/08/2011 0515   CREATININE 0.97 05/08/2011 0515   CALCIUM 6.9* 05/08/2011 0515   PROT 3.6* 05/07/2011 1950   ALBUMIN 1.8* 05/07/2011 1950   AST 88* 05/07/2011 1950   ALT 32 05/07/2011 1950   ALKPHOS 36* 05/07/2011 1950   BILITOT 1.3* 05/07/2011 1950   GFRNONAA 55* 05/08/2011 0515   GFRAA 64* 05/08/2011 0515     Ct Abdomen Pelvis W Contrast  05/07/2011  *RADIOLOGY REPORT*  Clinical Data: Abdominal pain  CT ABDOMEN AND PELVIS WITH CONTRAST  Technique:  Multidetector CT imaging of the abdomen and pelvis was performed following the standard protocol during bolus administration of intravenous contrast.  Contrast: OMNIPAQUE IOHEXOL 300 MG/ML IV SOLN  Comparison: 02/21/2011  Findings: Small bilateral pleural effusions.  Associate consolidations may reflect atelectasis or infiltrate. Cardiomegaly.  Small pericardial effusion.  Coronary artery calcification. Distal esophageal wall thickening.  There is a moderate amount of free intraperitoneal air and fluid. Layering high attenuation is in keeping with extravasated ingested contrast.  Focal outpouching at the gastroduodenal junction and mucosal hyperenhancement of the duodenum suggest a perforated gastric antral or proximal duodenal ulcer or leak anastomotic site as suture material is noted in this location as well.  Unremarkable liver, biliary system, spleen, pancreas, adrenal glands.  There are a couple renal hypodensities are too small to further characterize.  No hydronephrosis or hydroureter.  Decompressed bladder by a Foley catheter balloon.  Colonic diverticulosis without CT evidence for colitis.  No bowel obstruction.  Punctate calcifications within the uterus may reflect fibroids.  Streak artifact from the patient's right hip arthroplasty significantly limits evaluation of the pelvis.  There is  scattered atherosclerotic calcification of the aorta and its branches. No aneurysmal dilatation.  Multilevel degenerative changes of the imaged spine. No acute or aggressive appearing osseous lesion. .  IMPRESSION: Free intraperitoneal air and a moderate large amount of free intraperitoneal fluid.  Findings are in keeping with a perforation, either an ulcer or at a prior surgical  site.  Distal esophageal wall thickening may reflect gastroesophageal reflux disease or esophagitis.  Bibasilar opacities may reflect atelectasis or aspiration.  Critical Value/emergent results were called by telephone at the time of interpretation on 05/07/2011  at 02:50 a.m.  to  Dr. Brooke Dare, who verbally acknowledged these results.  Original Report Authenticated By: Waneta Martins, M.D.   Dg Chest Portable 1 View  05/07/2011  *RADIOLOGY REPORT*  Clinical Data: Respiratory distress, unable to hold still  PORTABLE CHEST - 1 VIEW  Comparison: None.  Findings: Cardiomegaly. Slight worsening aeration with mild increased perihilar edema.    ET tube remains low, at the right mainstem bronchus origin, and should be pulled back 2-3 cm. Nasogastric tube coiled in stomach.  Increasing left lower lobe atelectasis.  Moderate left effusion.  IMPRESSION: Worsening aeration.  ET tube too low.  Original Report Authenticated By: Elsie Stain, M.D.   Dg Chest Portable 1 View  05/07/2011  *RADIOLOGY REPORT*  Clinical Data: Intubation, line placement, sepsis  PORTABLE CHEST - 1 VIEW  Comparison: 04/14/2011  Findings: Low endotracheal tube just at the carina and directed to the right main stem bronchus.  This could be retracted 2 cm.  Left IJ central line tip in the upper SVC just below the azygos junction region.  NG tube is coiled in the stomach.  Telemetry leads overlie the chest.  Slight rotation to the left evident.  Lungs are decreased with worsening basilar atelectasis and left lower lobe consolidation.  Small effusions not excluded.  No pneumothorax.  IMPRESSION: Low endotracheal tube at the carina directed to the right mainstem bronchus can be retracted 2 cm  Low lung volumes with basilar atelectasis and worsening left lower lobe consolidation  No pneumothorax  Original Report Authenticated By: Judie Petit. Ruel Favors, M.D.    ASSESSMENT/PLAN:  Perforated pyloric ulcer s/p laparotomy 11/07 -post-op care per  surgery -?if she will need to start TNA for nutrion -protonix infusion  Septic shock secondary to peritonitis -hemodynamics improving -wean off pressors to keep SBP > 90, MAP > 65 -continue fluids to keep CVP > 8 -cortisol level 58 from 11/07>>will d/c solucortef -D2/x primaxin, micafungin, vancomycin  A fib with RVR -monitor heart rate -check Echo  Acute respiratory failure with VDRF -full vent support until hemodynamics improved -f/u CXR and ABG -bronchial hygiene  Metabolic acidosis -add HCO3 to IV fluid -f/u BMET and ABG  Oliguria likely from ATN in setting of septic shock -continue fluids based on CVP and monitor urine outpt  DM -Hyperglycemia protocol  Hx of HTN -hold outpt meds  Anemia -f/u CBC -transfuse for Hb < 7  Critical care time 50 minutes.  Trysten Berti Pager:  [phone] 05/08/2011, 9:24 AM

## 2011-05-08 NOTE — Progress Notes (Signed)
Treatment Team: Attending Provider: Md Pccm; Registered Nurse: Resa Miner, RN; Consulting Physician: Md Ccs; Consulting Physician: Coralyn Helling, MD; Technician: Garner Gavel, NT; Registered Nurse: Karolee Ohs, RN  PCP: [name], MD, MD  Subjective:  LOS: 2 days   1 Day Post-Op  Procedure(s): EXPLORATORY LAPAROTOMY GASTROSTOMY/J -tube GASTRECTOMY CHOLECYSTECTOMY  Intubated.  Opens eyes On Levophed 8 UOP OK  Objective: Vital signs in last 24 hours: Temp:  [97.1 F (36.2 C)-99.9 F (37.7 C)] 99.9 F (37.7 C) (11/09 0415) Pulse Rate:  [28-147] 44  (11/09 0830) Resp:  [10-45] 22  (11/09 0830) BP: (61-156)/(36-104) 145/54 mmHg (11/09 0830) SpO2:  [86 %-100 %] 100 % (11/09 0830) Arterial Line BP: (81-182)/(51-85) 144/62 mmHg (11/09 0830) FiO2 (%):  [2 %-100 %] 49.9 % (11/09 0830) Weight:  [190 lb 14.7 oz (86.6 kg)-197 lb 15.6 oz (89.8 kg)] 197 lb 15.6 oz (89.8 kg) (11/09 0500)    Intake/Output from previous day: 11/08 0701 - 11/09 0700 In: 11755.9 [I.V.:6055.9; Blood:700; IV Piggyback:5000] Out: 1755 [Urine:1005; Emesis/NG output:200; Drains:550] Intake/Output this shift: Total I/O In: 167.5 [I.V.:167.5] Out: 50 [Urine:50]  General: Pt intubated in modrate acute distress Eyes: PERRL, normal EOM. Neuro: CN II-XII intact w/o focal sensory/motor deficits. Lymph: No head/neck/groin lymphadenopathy Psych:  No delerium/psychosis/paranoia HEENT: Normocephalic, Mucus membranes moist.  No thrush Neck: Supple, No tracheal deviation Chest: Coarse BS Bilat No pain w good excursion CV:  Pulses intact.  Regular rhythm Abdomen: Obese soft, nontender/ mod distended.  No incarcerated hernias.  Dressing no active bleeding/dry Ext:  SCDs BLE.  No mjr edema.  No cyanosis Skin: No petechiae / purpurae   Lab Results:   CBC    Component Value Date/Time   WBC 3.1* 05/07/2011 1950   RBC 4.26 05/07/2011 1950   HGB 11.2* 05/07/2011 1950   HCT 33.2*  05/07/2011 1950   PLT 326 05/07/2011 1950   MCV 77.9* 05/07/2011 1950   MCH 26.3 05/07/2011 1950   MCHC 33.7 05/07/2011 1950   RDW 19.0* 05/07/2011 1950   LYMPHSABS 0.7 05/07/2011 1950   MONOABS 0.1 05/07/2011 1950   EOSABS 0.0 05/07/2011 1950   BASOSABS 0.0 05/07/2011 1950    BMET    Component Value Date/Time   NA 134* 05/08/2011 0515   K 3.9 05/08/2011 0515   CL 107 05/08/2011 0515   CO2 14* 05/08/2011 0515   GLUCOSE 184* 05/08/2011 0515   BUN 16 05/08/2011 0515   CREATININE 0.97 05/08/2011 0515   CALCIUM 6.9* 05/08/2011 0515   GFRNONAA 55* 05/08/2011 0515   GFRAA 64* 05/08/2011 0515      Studies/Results: @RISRSLT24 @  Anti-infectives: Anti-infectives     Start     Dose/Rate Route Frequency Ordered Stop   05/07/11 1400   imipenem-cilastatin (PRIMAXIN) 500 mg in sodium chloride 0.9 % 100 mL IVPB        500 mg 200 mL/hr over 30 Minutes Intravenous 3 times per day 05/07/11 1106     05/07/11 1200   micafungin (MYCAMINE) 100 mg in sodium chloride 0.9 % 100 mL IVPB     Comments: PER PHARMACY      100 mg 100 mL/hr over 1 Hours Intravenous Every 24 hours 05/07/11 1032     05/07/11 1200   vancomycin (VANCOCIN) 1,250 mg in sodium chloride 0.9 % 250 mL IVPB        1,250 mg 166.7 mL/hr over 90 Minutes Intravenous Every 24 hours 05/07/11 1106     05/07/11 1000  micafungin (MYCAMINE) 150 mg in sodium chloride 0.9 % 100 mL IVPB  Status:  Discontinued     Comments: PER PHARMACY      150 mg 100 mL/hr over 1 Hours Intravenous Daily 05/07/11 0951 05/07/11 1029   05/07/11 0300   ertapenem (INVANZ) 1 g in sodium chloride 0.9 % 50 mL IVPB        1 g 100 mL/hr over 30 Minutes Intravenous  Once 05/07/11 0300 05/07/11 0348   05/07/11 0130   cefTRIAXone (ROCEPHIN) 1 g in dextrose 5 % 50 mL IVPB        1 g 100 mL/hr over 30 Minutes Intravenous  Once 05/07/11 0122 05/07/11 0200            Assessment/Plan: POD# 1 s/p Procedure(s): EXPLORATORY LAPAROTOMY GASTROSTOMY GASTRECTOMY w Bilroth II  reconstruction (duodenal stump, loop gastrojejunostomy)  CHOLECYSTECTOMY  Principal Problem:  *Sepsis Active Problems:  DIABETES MELLITUS, TYPE II  HYPERTENSION  Anemia  Hyponatremia  Abdominal pain, acute  Metabolic acidosis  Esophagitis, erosive   Plan:  -Start trophic TF via Jtube.  Advance when off pressors -OGT out.  G tube to LIWS -continue int/ext drainage -IV Abx (prob OK to change to primaxin only, defer to CCM) x 7days -f/u path -change PPI to Q12H -pharm help dosing/med adjustment -VTE prophylaxis- SCDs, etc   Lorenso Courier, M.D., F.A.C.S. Gastrointestinal and Minimally Invasive Surgery [facility] [address], [address] [place], [place] [address] [phone] Main / Paging [phone] Voice Mail  05/08/2011

## 2011-05-08 NOTE — Progress Notes (Signed)
INITIAL ADULT NUTRITION ASSESSMENT Date: 05/08/2011   Time: 8:26 AM Reason for Assessment: Consult TF recs  ASSESSMENT: Female 77 y.o.  Dx: Sepsis  Hx:  Past Medical History  Diagnosis Date  . Hypertension   . Reflux   . Right elbow pain     OTIF  . Dementia   . Depression   . Osteoarthritis   . Pancreatitis 11/2007    HOP  . Angiomyolipoma of kidney     right  . Ulcerative esophagitis 12/05/2007    hx elevated gastrin, severe on EGD by Dr Jena Gauss , h pylori negative  . Hiatal hernia   . S/P colonoscopy 2009    pt reports normal by Dr Lovell Sheehan  . Diabetes mellitus   . Anemia   . Hyponatremia    Related Meds:     . sodium chloride   Intravenous STAT  . sodium chloride   Intravenous Once  . antiseptic oral rinse  15 mL Mouth Rinse q12n4p  . calcium gluconate  1 g Intravenous Once  . chlorhexidine  15 mL Mouth Rinse BID  . dexmedetomidine (PRECEDEX) IV infusion  0.2-1.2 mcg/kg/hr Intravenous To OR  . etomidate  10 mg Intravenous Once  . fentaNYL      . hydrocortisone sodium succinate  50 mg Intravenous Q6H  . imipenem-cilastatin  500 mg Intravenous Q8H  . insulin aspart  0-4 Units Subcutaneous Q4H  . lip balm  1 application Topical BID  . magnesium sulfate infusion  3 g Intravenous Once  . micafungin [facility]) IV  100 mg Intravenous Q24H  . midazolam      . midazolam      . sodium chloride  1,000 mL Intravenous Once  . sodium chloride  1,000 mL Intravenous Once  . sodium chloride  1,000 mL Intravenous Once  . sodium chloride  25 mL/kg Intravenous Once  . sodium chloride  500 mL Intravenous Once  . sodium chloride  500 mL Intravenous Once  . vancomycin  1,250 mg Intravenous Q24H  . DISCONTD: micafungin (MYCAMINE) IV  150 mg Intravenous Daily   Ht:  (65")  Wt: 197 lb 15.6 oz (89.8 kg)  Ideal Wt: 57kg  % Ideal Wt: 158%  Usual Wt: unable to obtain % Usual Wt:   Body mass index is 31.48 kg/(m^2).  Food/Nutrition Related Hx: unable to obtain  Labs: Sodium  134 mEq/L L     Potassium 3.9 mEq/L      Chloride 107 mEq/L      CO2 14 mEq/L L     Glucose, Bld 184 mg/dL H     BUN 16 mg/dL      Creatinine, Ser 9.52 mg/dL      Calcium 6.9 mg/dL L     GFR calc non Af Amer 55 mL/min L     GFR calc Af Amer 64 mL/min L   Intake: I/O last 3 completed shifts: In: 11755.9 [I.V.:6055.9; Blood:700; IV Piggyback:5000] Out: 1755 [Urine:1005; Emesis/NG output:200; Drains:550]     Diet Order: NPO  Supplements/Tube Feeding: Ng to suction feeding pt via jtube. Propofol @10 .4 provides 275kcals.  IVF:    sodium chloride Last Rate: 250 mL (05/08/11 0600)  sodium chloride   sodium chloride Last Rate: 15 mL/hr at 05/08/11 0200  dexmedetomidine (PRECEDEX) IV infusion Last Rate: Stopped (05/08/11 0112)  dextrose   pantoprozole (PROTONIX) infusion Last Rate: 8 mg/hr (05/08/11 0007)  propofol Last Rate: 30 mcg/kg/min (05/08/11 8413)  vasopressin (PITRESSIN) infusion - *FOR SHOCK*   DISCONTD:  sodium chloride 0.45 % 1,000 mL with sodium bicarbonate 100 mEq infusion     Estimated Nutritional Needs:   Kcal: 1200-1500 Protein: greater than or equal to 115gm Fluid: 67ml/kcal  NUTRITION DIAGNOSIS: -Inadequate oral intake (NI-2.1).  Status: Ongoing  RELATED TO: VDRF  AS EVIDENCE BY: inability to advance past NPO  MONITORING/EVALUATION(Goals): Goal meet greater than or equal to 60% energy/100% prot needs per ASPEN guidelines for critically ill obese patients Monitor: TF tolerance/adequacy, wt trends labs  EDUCATION NEEDS: -Education not appropriate at this time  INTERVENTION: 1. Start vital 1.5@ 35ml/hr surgery to increase TF as pt tolerated to goal rate 36ml/hr to provide 1260kcals/57gmprot/683mlH20 2. Prostat 64 30 ml four times daily to provide 60gmprot/288kcals Total 1548kcals/117gmprot Propofol adding additional 275 kcals therefore should consider alternate sedation method  Dietitian [phone]  DOCUMENTATION CODES Per approved criteria  -Not Applicable      Debbe Mounts 05/08/2011, 8:26 AM

## 2011-05-09 ENCOUNTER — Inpatient Hospital Stay (HOSPITAL_COMMUNITY): Payer: Medicare HMO

## 2011-05-09 LAB — GLUCOSE, CAPILLARY
Glucose-Capillary: 141 mg/dL — ABNORMAL HIGH (ref 70–99)
Glucose-Capillary: 122 mg/dL — ABNORMAL HIGH (ref 70–99)
Glucose-Capillary: 131 mg/dL — ABNORMAL HIGH (ref 70–99)
Glucose-Capillary: 119 mg/dL — ABNORMAL HIGH (ref 70–99)
Glucose-Capillary: 113 mg/dL — ABNORMAL HIGH (ref 70–99)

## 2011-05-09 LAB — COMPREHENSIVE METABOLIC PANEL
Albumin: 1.5 g/dL — ABNORMAL LOW (ref 3.5–5.2)
Alkaline Phosphatase: 59 U/L (ref 39–117)
BUN: 19 mg/dL (ref 6–23)
Creatinine, Ser: 0.63 mg/dL (ref 0.50–1.10)
GFR calc Af Amer: >90 mL/min (ref >90)
Glucose, Bld: 133 mg/dL — ABNORMAL HIGH (ref 70–99)
Potassium: 3 mEq/L — ABNORMAL LOW (ref 3.5–5.1)
Total Protein: 4.4 g/dL — ABNORMAL LOW (ref 6.0–8.3)

## 2011-05-09 LAB — BLOOD GAS, ARTERIAL
Bicarbonate: 21.1 mEq/L (ref 20.0–24.0)
Bicarbonate: 23.7 mEq/L (ref 20.0–24.0)
MECHVT: 450 mL
O2 Saturation: 99.3 %
PEEP: 5 cmH2O
PEEP: 5 cmH2O
Patient temperature: 98.6
Patient temperature: 98.7
TCO2: 22 mmol/L (ref 0–100)
TCO2: 24.8 mmol/L (ref 0–100)
pCO2 arterial: 34.7 mmHg — ABNORMAL LOW (ref 35.0–45.0)
pH, Arterial: 7.45 — ABNORMAL HIGH (ref 7.350–7.400)

## 2011-05-09 LAB — CBC
HCT: 27.6 % — ABNORMAL LOW (ref 36.0–46.0)
Platelets: 239 10*3/uL (ref 150–400)
RBC: 3.58 MIL/uL — ABNORMAL LOW (ref 3.87–5.11)
RDW: 19.9 % — ABNORMAL HIGH (ref 11.5–15.5)
WBC: 10.1 10*3/uL (ref 4.0–10.5)

## 2011-05-09 MED ORDER — POTASSIUM CHLORIDE 10 MEQ/50ML IV SOLN
INTRAVENOUS | Status: AC
  Administered 2011-05-09: 10 meq
  Filled 2011-05-09: qty 50

## 2011-05-09 MED ORDER — POTASSIUM CHLORIDE 10 MEQ/100ML IV SOLN
10.0000 meq | INTRAVENOUS | Status: AC
  Administered 2011-05-09 (×3): 10 meq via INTRAVENOUS
  Filled 2011-05-09 (×2): qty 100

## 2011-05-09 MED ORDER — POTASSIUM CHLORIDE 10 MEQ/50ML IV SOLN
INTRAVENOUS | Status: AC
  Filled 2011-05-09: qty 50

## 2011-05-09 NOTE — Progress Notes (Signed)
Nurses just changed packing before I arrived - reports wound fairly clean.  Abd soft and B9.  Agree with A&P per KO PA

## 2011-05-09 NOTE — Progress Notes (Signed)
  2 Days Post-Op  Subjective: Pt opens her eyes to her name.  Objective: Vital signs in last 24 hours: Temp:  [97.8 F (36.6 C)-99.5 F (37.5 C)] 99.5 F (37.5 C) (11/10 1136) Pulse Rate:  [32-130] 118  (11/10 1149) Resp:  [16-29] 24  (11/10 1149) BP: (128-213)/(48-95) 204/95 mmHg (11/10 1149) SpO2:  [99 %-100 %] 100 % (11/10 1149) Arterial Line BP: (141-222)/(61-101) 175/79 mmHg (11/10 1100) FiO2 (%):  [30 %-50.7 %] 30 % (11/10 1149) Weight:  [197 lb 8.5 oz (89.6 kg)] 197 lb 8.5 oz (89.6 kg) (11/09 2000)    Intake/Output from previous day: 11/09 0701 - 11/10 0700 In: 2987 [I.V.:2047; IV Piggyback:570] Out: 1565 [Urine:1000; Drains:565] Intake/Output this shift:    PE: Abd: soft, ND, obese, incision packed in between staples.  JP1 with serous output.  JP 2 with amber, serous output, GJ tube in place with trophic tube feeds  Lab Results:   [facility] 05/09/11 0350 05/07/11 1950  WBC 10.1 3.1*  HGB 9.8* 11.2*  HCT 27.6* 33.2*  PLT 239 326   BMET  Basename 05/09/11 0350 05/08/11 0515  NA 133* 134*  K 3.0* 3.9  CL 102 107  CO2 21 14*  GLUCOSE 133* 184*  BUN 19 16  CREATININE 0.63 0.97  CALCIUM 8.0* 6.9*   PT/INR  Basename 05/08/11 0805 05/07/11 1039  LABPROT 25.5* 18.1*  INR 2.28* 1.47     Studies/Results: @RISRSLT2 @  Anti-infectives: Anti-infectives     Start     Dose/Rate Route Frequency Ordered Stop   05/07/11 1400  imipenem-cilastatin (PRIMAXIN) 500 mg in sodium chloride 0.9 % 100 mL IVPB       500 mg 200 mL/hr over 30 Minutes Intravenous 3 times per day 05/07/11 1106     05/07/11 1200   micafungin (MYCAMINE) 100 mg in sodium chloride 0.9 % 100 mL IVPB     Comments: PER PHARMACY      100 mg 100 mL/hr over 1 Hours Intravenous Every 24 hours 05/07/11 1032     05/07/11 1200   vancomycin (VANCOCIN) 1,250 mg in sodium chloride 0.9 % 250 mL IVPB        1,250 mg 166.7 mL/hr over 90 Minutes Intravenous Every 24 hours 05/07/11 1106     05/07/11 1000    micafungin (MYCAMINE) 150 mg in sodium chloride 0.9 % 100 mL IVPB  Status:  Discontinued     Comments: PER PHARMACY      150 mg 100 mL/hr over 1 Hours Intravenous Daily 05/07/11 0951 05/07/11 1029   05/07/11 0300   ertapenem (INVANZ) 1 g in sodium chloride 0.9 % 50 mL IVPB        1 g 100 mL/hr over 30 Minutes Intravenous  Once 05/07/11 0300 05/07/11 0348   05/07/11 0130   cefTRIAXone (ROCEPHIN) 1 g in dextrose 5 % 50 mL IVPB        1 g 100 mL/hr over 30 Minutes Intravenous  Once 05/07/11 0122 05/07/11 0200           Assessment/Plan  1. S/p ex lap with gastrectomy, cholecystectomy, placement of GJ tube 2. PCM/Tube feeds  Plan:  NS WD dressing changes BID to abdominal wound Cont trophic tube feeds.  Will have nutrition eval for goal rate.  Will likely adv tomorrow.   LOS: 3 days    Naveh Rickles E 05/09/2011

## 2011-05-09 NOTE — Progress Notes (Signed)
[name] is a 77 y.o. female transferred from [facility] on 05/06/2011 with headache and abd pain.  Found to have intraperitoneal free air, and perforated pylorus complicated by septic shock and VDRF. PMHx HTN, DM, GERD, Dementia, Depression, OA, Pancreatitis, Angiomyolipoma Rt kidney, Anemai  Lines/tubes  Left IJ 11/8>>>  ETT 11/8>>>  Rt fem aline 11/8>>> 11/10 Foley 11/7 (at [facility]) >>  Microbiology/Sepsis markers:  MSSA PCR 11/18 - POSITIVe MRSA PCR 11/18 - NEGATIVE BCx2 11/8>>>  Urine 11/8>>>  Lactate 11/8>>> 3 Pct 11/8>>>  130 Cortisol 11/8 >> 58 ................... PCt 11/11 Lactate 11/11  Anti-infectives:  Vanc 11/8>>>  Primaxin 11/8>>>  Micofungin 11/8>>>   Best Practice/Protocols:  DVT - SCD's  PPI - protonix  Sepsis protocol 11/8  ICU hyperglycemia protocol 11/8   Consults:  Surgery   Studies/events:  11/7: CT scan>>free air 11/7: Ex lap for perforated pyloric ulcer 11/11 - failed SBT.    SUBJECTIVE: WUA - agitated SBT - failed SBT instantly RR 40  Off pressors. Low grade fever.d2 diprivan gtt. D2 of trickle tube feeds  OBJECTIVE:  Blood pressure 171/83, pulse 116, temperature 99.5 F (37.5 C), temperature source Oral, resp. rate 24, height 5\' 6"  (1.676 m), weight 89.6 kg (197 lb 8.5 oz), SpO2 98.00%.   Intake/Output Summary (Last 24 hours) at 05/09/11 1431 Last data filed at 05/09/11 1200  Gross per 24 hour  Intake 2336.4 ml  Output   1320 ml  Net 1016.4 ml   PHYSICAL EXAM: General - critically ill appearing, very deconditioned HEENT - ETT in place Cardiac - irregular Chest - scattered rhonchi Abd - wound dressing clean Ext - 1+ edema Neuro - sedated but agitated on WUA but able to follow commands  Lab Results  Component Value Date   WBC 10.1 05/09/2011   HGB 9.8* 05/09/2011   HCT 27.6* 05/09/2011   MCV 77.1* 05/09/2011   PLT 239 05/09/2011   CMP     Component Value Date/Time   NA 133* 05/09/2011 0350   K 3.0* 05/09/2011 0350    CL 102 05/09/2011 0350   CO2 21 05/09/2011 0350   GLUCOSE 133* 05/09/2011 0350   BUN 19 05/09/2011 0350   CREATININE 0.63 05/09/2011 0350   CALCIUM 8.0* 05/09/2011 0350   PROT 4.4* 05/09/2011 0350   ALBUMIN 1.5* 05/09/2011 0350   AST 91* 05/09/2011 0350   ALT 42* 05/09/2011 0350   ALKPHOS 59 05/09/2011 0350   BILITOT 0.3 05/09/2011 0350   GFRNONAA 84* 05/09/2011 0350   GFRAA >90 05/09/2011 0350     Dg Chest Portable 1 View  05/09/2011  *RADIOLOGY REPORT*  Clinical Data: Follow up respiratory failure  PORTABLE CHEST - 1 VIEW  Comparison: 05/07/2011  Findings: ET tube remains low at the right mainstem bronchus orientation.  Recommend pulling back by 2-3 cm.  Left IJ catheter tip is in the SVC.  Interval increase in bilateral pleural effusions and interstitial edema.  IMPRESSION:  1.  Increase in CHF pattern. 2.  ET tube remains too low.  Original Report Authenticated By: Rosealee Albee, M.D.   Dg Chest Portable 1 View  05/07/2011  *RADIOLOGY REPORT*  Clinical Data: Respiratory distress, unable to hold still  PORTABLE CHEST - 1 VIEW  Comparison: None.  Findings: Cardiomegaly. Slight worsening aeration with mild increased perihilar edema.    ET tube remains low, at the right mainstem bronchus origin, and should be pulled back 2-3 cm. Nasogastric tube coiled in stomach.  Increasing left lower  lobe atelectasis.  Moderate left effusion.  IMPRESSION: Worsening aeration.  ET tube too low.  Original Report Authenticated By: Elsie Stain, M.D.    ASSESSMENT/PLAN:  Perforated pyloric ulcer s/p laparotomy 11/07 -post-op care per surgery - continue tube feed d2 -protonix infusion  Septic shock secondary to peritonitis -hemodynamics improving; off pressors  PLAN -continue fluids to keep CVP > 8 --D3/x primaxin, micafungin, vancomycin  A fib with RVR this admit but on 11/10 is in sinuse (ECHO 02/09/11 - normal) and trop x 1 is negative on 11/8 -monitor heart rate - recheck  trop  Acute respiratory failure with VDRF   -failed SBT 11/11 -full vent support until hemodynamics improved -f/u CXR and ABG -bronchial hygiene  Metabolic acidosis - probably non gap from gi surgery. Normal creat -discontinueHCO3 -f/u ABG 11/10 and 11/11  Oliguria likely from ATN in setting of septic shock  - on 05/09/11 am made 1000 cc in 24h PLAN -continue fluids based on CVP and monitor urine outpt  DM -Hyperglycemia protocol  Hx of HTN -hold outpt meds  Anemia -f/u CBC -transfuse for Hb < 7  Critical care time 45 minutes.  Zandyr Barnhill Pager:  [phone] 05/09/2011, 2:31 PM

## 2011-05-09 NOTE — Progress Notes (Signed)
eLink Physician-Brief Progress Note Patient Name: [name] DOB: 02-11-1932 MRN: [id number]  Date of Service  05/09/2011   HPI/Events of Note   Hypokalemia  eICU Interventions  Potassium replaced    Raymone Pembroke 05/09/2011, 5:05 AM

## 2011-05-09 NOTE — Progress Notes (Signed)
Consult to re-evaluate enteral advancement Diprivan now providing 543 Kcal/dy Advance Vital 1.5 to goal of 20 ml/hr Add prostat 64,  30 ml, 4 times per day, to add 60 grams protein  Provides 1008 Kcal and 92 grams protein. Total caloric intake with diprivan 1551 Kcal/day As diprivan weaned Vital can be advanced to allow to meet caloric and protein goals

## 2011-05-10 DIAGNOSIS — K631 Perforation of intestine (nontraumatic): Secondary | ICD-10-CM

## 2011-05-10 DIAGNOSIS — R6521 Severe sepsis with septic shock: Secondary | ICD-10-CM

## 2011-05-10 DIAGNOSIS — J9600 Acute respiratory failure, unspecified whether with hypoxia or hypercapnia: Secondary | ICD-10-CM

## 2011-05-10 DIAGNOSIS — A419 Sepsis, unspecified organism: Secondary | ICD-10-CM

## 2011-05-10 DIAGNOSIS — E46 Unspecified protein-calorie malnutrition: Secondary | ICD-10-CM

## 2011-05-10 DIAGNOSIS — I517 Cardiomegaly: Secondary | ICD-10-CM

## 2011-05-10 LAB — GLUCOSE, CAPILLARY
Glucose-Capillary: 129 mg/dL — ABNORMAL HIGH (ref 70–99)
Glucose-Capillary: 156 mg/dL — ABNORMAL HIGH (ref 70–99)
Glucose-Capillary: 155 mg/dL — ABNORMAL HIGH (ref 70–99)
Glucose-Capillary: 180 mg/dL — ABNORMAL HIGH (ref 70–99)

## 2011-05-10 LAB — LACTIC ACID, PLASMA: Lactic Acid, Venous: 1.4 mmol/L (ref 0.5–2.2)

## 2011-05-10 LAB — BASIC METABOLIC PANEL
CO2: 25 mEq/L (ref 19–32)
Calcium: 8.3 mg/dL — ABNORMAL LOW (ref 8.4–10.5)
Chloride: 100 mEq/L (ref 96–112)
Creatinine, Ser: 0.51 mg/dL (ref 0.50–1.10)
GFR calc Af Amer: >90 mL/min (ref >90)
Sodium: 134 mEq/L — ABNORMAL LOW (ref 135–145)

## 2011-05-10 LAB — PHOSPHORUS: Phosphorus: 3.1 mg/dL (ref 2.3–4.6)

## 2011-05-10 MED ORDER — FUROSEMIDE 10 MG/ML IJ SOLN
40.0000 mg | Freq: Two times a day (BID) | INTRAMUSCULAR | Status: DC
  Administered 2011-05-10 – 2011-05-11 (×2): 40 mg via INTRAVENOUS
  Filled 2011-05-10 (×4): qty 4

## 2011-05-10 MED ORDER — POTASSIUM CHLORIDE 20 MEQ/15ML (10%) PO LIQD
40.0000 meq | Freq: Two times a day (BID) | ORAL | Status: DC
  Administered 2011-05-10 – 2011-05-11 (×2): 40 meq
  Filled 2011-05-10 (×4): qty 30

## 2011-05-10 MED ORDER — POTASSIUM CHLORIDE 10 MEQ/50ML IV SOLN
10.0000 meq | INTRAVENOUS | Status: AC
  Administered 2011-05-10 (×4): 10 meq via INTRAVENOUS
  Filled 2011-05-10: qty 200

## 2011-05-10 MED ORDER — POTASSIUM CHLORIDE 20 MEQ/15ML (10%) PO LIQD
40.0000 meq | Freq: Two times a day (BID) | ORAL | Status: DC
  Filled 2011-05-10: qty 30

## 2011-05-10 MED ORDER — VITAL 1.5 CAL PO LIQD
1000.0000 mL | ORAL | Status: DC
  Administered 2011-05-10 – 2011-05-12 (×2): 1000 mL
  Filled 2011-05-10 (×3): qty 1000

## 2011-05-10 NOTE — Progress Notes (Signed)
eLink Physician-Brief Progress Note Patient Name: [name] DOB: August 03, 1931 MRN: [id number]  Date of Service  05/10/2011   HPI/Events of Note   Hypokalemia  eICU Interventions  Potassium replaced    [name] 05/10/2011, 7:29 AM

## 2011-05-10 NOTE — Progress Notes (Signed)
[name] is a 77 y.o. female transferred from [facility] on 05/06/2011 with headache and abd pain.  Found to have intraperitoneal free air, and perforated pylorus complicated by septic shock and VDRF. PMHx HTN, DM, GERD, Dementia, Depression, OA, Pancreatitis, Angiomyolipoma Rt kidney, Anemai  Lines/tubes  Left IJ 11/8>>>  ETT 11/8>>>  Rt fem aline 11/8>>> 11/10 Foley 11/7 (at [facility]) >>  Microbiology/Sepsis markers:  MSSA PCR 11/18 - POSITIVE MRSA PCR 11/18 - NEGATIVE BCx2 11/8>>>  Urine 11/8>>>  Lactate 11/8>>> 3 Pct 11/8>>>  130 Cortisol 11/8 >> 58 ................... PCt 11/11 - 1.4 Lactate 11/11 - 7.24  Anti-infectives:  Vanc 11/8>>>  Primaxin 11/8>>>  Micofungin 11/8>>>   Best Practice/Protocols:  DVT - SCD's  PPI - protonix  Sepsis protocol 11/8  ICU hyperglycemia protocol 11/8   Consults:  Surgery   Studies/events:  11/7: CT scan>>free air 11/7: Ex lap for perforated pyloric ulcer 11/11 - failed SBT after few hours, .     SUBJECTIVE: WUA -  Per RN when diprivan turned off, opened eyes and attempted to follow commands SBT - failed after several hours; huge improvement   Low grade fever improving. D3 diprivan gtt. D3 of trickle tube feeds  Significant 3rd spacing  + Oliguuric too  OBJECTIVE:  Blood pressure 147/88, pulse 129, temperature 97.2 F (36.2 C), temperature source Oral, resp. rate 34, height 5\' 6"  (1.676 m), weight 89.9 kg (198 lb 3.1 oz), SpO2 99.00%.   Intake/Output Summary (Last 24 hours) at 05/10/11 1534 Last data filed at 05/10/11 1500  Gross per 24 hour  Intake   1815 ml  Output   1115 ml  Net    700 ml   PHYSICAL EXAM: General - critically ill appearing, very deconditioned HEENT - ETT in place Cardiac - irregular Chest - scattered rhonchi Abd - wound dressing clean Ext - 3+ edema Neuro - sedated but agitated on WUA but able to follow commands  Lab Results  Component Value Date   WBC 10.1 05/09/2011   HGB 9.8* 05/09/2011    HCT 27.6* 05/09/2011   MCV 77.1* 05/09/2011   PLT 239 05/09/2011   CMP     Component Value Date/Time   NA 134* 05/10/2011 0500   K 3.1* 05/10/2011 0500   CL 100 05/10/2011 0500   CO2 25 05/10/2011 0500   GLUCOSE 127* 05/10/2011 0500   BUN 15 05/10/2011 0500   CREATININE 0.51 05/10/2011 0500   CALCIUM 8.3* 05/10/2011 0500   PROT 4.4* 05/09/2011 0350   ALBUMIN 1.5* 05/09/2011 0350   AST 91* 05/09/2011 0350   ALT 42* 05/09/2011 0350   ALKPHOS 59 05/09/2011 0350   BILITOT 0.3 05/09/2011 0350   GFRNONAA >90 05/10/2011 0500   GFRAA >90 05/10/2011 0500     Dg Chest Portable 1 View  05/09/2011  *RADIOLOGY REPORT*  Clinical Data: Repositioned endotracheal tube  PORTABLE CHEST - 1 VIEW  Comparison: 05/09/2011; 05/07/2011  Findings: Unchanged enlarged cardiac silhouette and mediastinal contours.  No significant interval change in endotracheal tube positioning with tip remains approximately 1 cm from the carina. Grossly unchanged position of left jugular approach and venous catheter tip overlying the superior cavoatrial junction.  No changed minimally improved aeration of the right lung base. The pulmonary vasculature remains indistinct.  Grossly unchanged hazy left basilar opacity likely representing a layering pleural effusion.  Grossly unchanged left basilar heterogeneous / consolidative opacities.  No definite pneumothorax.  Unchanged bones.  IMPRESSION: 1.  No interval change in  endotracheal tube positioning with tip again approximate 1 cm from the carina.  Otherwise, unchanged support apparatus.  No definite pneumothorax. 2.  Minimally improved aeration of the right lung base with persistent findings of mild pulmonary edema and layering left sided effusion and left basilar heterogeneous / consolidative opacities, possibly atelectasis though underlying infection is not excluded.  Original Report Authenticated By: Waynard Reeds, M.D.   Dg Chest Portable 1 View  05/09/2011  *RADIOLOGY  REPORT*  Clinical Data: Follow up respiratory failure  PORTABLE CHEST - 1 VIEW  Comparison: 05/07/2011  Findings: ET tube remains low at the right mainstem bronchus orientation.  Recommend pulling back by 2-3 cm.  Left IJ catheter tip is in the SVC.  Interval increase in bilateral pleural effusions and interstitial edema.  IMPRESSION:  1.  Increase in CHF pattern. 2.  ET tube remains too low.  Original Report Authenticated By: Rosealee Albee, M.D.    ASSESSMENT/PLAN:  #Perforated pyloric ulcer s/p laparotomy 11/07. On d3 of trickle feeds PLAN -post-op care per surgery - continue tube feed d2 -protonix once daily  #Septic shock secondary to peritonitis. Off pressors    PLAN - time to diurese; due to 3rd spacing --D4/x primaxin, micafungin, vancomycin  #A fib with RVR this admit but on 11/10 is in sinuse (ECHO 02/09/11 - normal) and trop x 1 is negative on 11/8   > on 05/10/11: HR 130s and appears sinus  PLAN -monitor heart rate - recheck trop  #Acute respiratory failure with VDRF   -failed SBT 11/11 but had improved performance compared to 11/10 PLAN - diurese -full vent support until hemodynamics improved -f/u CXR and ABG -bronchial hygiene  #Metabolic acidosis - probably non gap from gi surgery. Normal creat. ABG off bicarb on 11/10 was normal PLAN --f/u ABG and clinically periodically  #Oliguria likely from ATN in setting of septic shock  - on 05/09/11 am made 1000 cc in 24h  - on 05/10/11 - made 480cc urine; and signifcant 3rd spacing ++ PLAN -start lasix - check BNP  #Hypokalemia  - repleted PLAn - goal K > 4   #DM -Hyperglycemia protocol  #Hx of HTN -hold outpt meds  #Anemia -f/u CBC -transfuse for Hb < 7  Critical care time 35 minutes. No family at bedside  [facility] Pager:  [phone] 05/10/2011, 3:34 PM

## 2011-05-10 NOTE — Progress Notes (Signed)
3 Days Post-Op  Subjective: Weaning on vent  Objective: Vital signs in last 24 hours: Temp:  [97.8 F (36.6 C)-99.5 F (37.5 C)] 97.8 F (36.6 C) (11/11 0800) Pulse Rate:  [110-125] 117  (11/11 0800) Resp:  [0-36] 25  (11/11 0800) BP: (114-204)/(62-95) 163/87 mmHg (11/11 0800) SpO2:  [98 %-100 %] 100 % (11/11 0800) Arterial Line BP: (175-229)/(79-106) 207/95 mmHg (11/10 1600) FiO2 (%):  [0.3 %-39.7 %] 29.6 % (11/11 0800) Weight:  [89.9 kg (198 lb 3.1 oz)] 198 lb 3.1 oz (89.9 kg) (11/11 0600)    Intake/Output from previous day: 11/10 0701 - 11/11 0700 In: 2140 [I.V.:1130; NG/GT:120; IV Piggyback:660] Out: 950 [Urine:480; Drains:470] Intake/Output this shift: Total I/O In: 50 [IV Piggyback:50] Out: -   On vent Lungs CTA CV reg Abd soft, wound clean, some BS  Lab Results:   [facility] 05/09/11 0350 05/07/11 1950  WBC 10.1 3.1*  HGB 9.8* 11.2*  HCT 27.6* 33.2*  PLT 239 326   BMET  Basename 05/10/11 0500 05/09/11 0350  NA 134* 133*  K 3.1* 3.0*  CL 100 102  CO2 25 21  GLUCOSE 127* 133*  BUN 15 19  CREATININE 0.51 0.63  CALCIUM 8.3* 8.0*   PT/INR  Basename 05/08/11 0805 05/07/11 1039  LABPROT 25.5* 18.1*  INR 2.28* 1.47   ABG  Basename 05/09/11 1558 05/09/11 0500  PHART 7.450* 7.460*  HCO3 23.7 21.1    Studies/Results: Dg Chest Portable 1 View  05/09/2011  *RADIOLOGY REPORT*  Clinical Data: Repositioned endotracheal tube  PORTABLE CHEST - 1 VIEW  Comparison: 05/09/2011; 05/07/2011  Findings: Unchanged enlarged cardiac silhouette and mediastinal contours.  No significant interval change in endotracheal tube positioning with tip remains approximately 1 cm from the carina. Grossly unchanged position of left jugular approach and venous catheter tip overlying the superior cavoatrial junction.  No changed minimally improved aeration of the right lung base. The pulmonary vasculature remains indistinct.  Grossly unchanged hazy left basilar opacity likely  representing a layering pleural effusion.  Grossly unchanged left basilar heterogeneous / consolidative opacities.  No definite pneumothorax.  Unchanged bones.  IMPRESSION: 1.  No interval change in endotracheal tube positioning with tip again approximate 1 cm from the carina.  Otherwise, unchanged support apparatus.  No definite pneumothorax. 2.  Minimally improved aeration of the right lung base with persistent findings of mild pulmonary edema and layering left sided effusion and left basilar heterogeneous / consolidative opacities, possibly atelectasis though underlying infection is not excluded.  Original Report Authenticated By: Waynard Reeds, M.D.   Dg Chest Portable 1 View  05/09/2011  *RADIOLOGY REPORT*  Clinical Data: Follow up respiratory failure  PORTABLE CHEST - 1 VIEW  Comparison: 05/07/2011  Findings: ET tube remains low at the right mainstem bronchus orientation.  Recommend pulling back by 2-3 cm.  Left IJ catheter tip is in the SVC.  Interval increase in bilateral pleural effusions and interstitial edema.  IMPRESSION:  1.  Increase in CHF pattern. 2.  ET tube remains too low.  Original Report Authenticated By: Rosealee Albee, M.D.    Anti-infectives: Anti-infectives     Start     Dose/Rate Route Frequency Ordered Stop   05/07/11 1400   imipenem-cilastatin (PRIMAXIN) 500 mg in sodium chloride 0.9 % 100 mL IVPB        500 mg 200 mL/hr over 30 Minutes Intravenous 3 times per day 05/07/11 1106     05/07/11 1200   micafungin (MYCAMINE) 100 mg in  sodium chloride 0.9 % 100 mL IVPB     Comments: PER PHARMACY      100 mg 100 mL/hr over 1 Hours Intravenous Every 24 hours 05/07/11 1032     05/07/11 1200   vancomycin (VANCOCIN) 1,250 mg in sodium chloride 0.9 % 250 mL IVPB        1,250 mg 166.7 mL/hr over 90 Minutes Intravenous Every 24 hours 05/07/11 1106     05/07/11 1000   micafungin (MYCAMINE) 150 mg in sodium chloride 0.9 % 100 mL IVPB  Status:  Discontinued     Comments: PER  PHARMACY      150 mg 100 mL/hr over 1 Hours Intravenous Daily 05/07/11 0951 05/07/11 1029   05/07/11 0300   ertapenem (INVANZ) 1 g in sodium chloride 0.9 % 50 mL IVPB        1 g 100 mL/hr over 30 Minutes Intravenous  Once 05/07/11 0300 05/07/11 0348   05/07/11 0130   cefTRIAXone (ROCEPHIN) 1 g in dextrose 5 % 50 mL IVPB        1 g 100 mL/hr over 30 Minutes Intravenous  Once 05/07/11 0122 05/07/11 0200          Assessment/Plan: s/p Procedure(s): EXPLORATORY LAPAROTOMY GASTROSTOMY GASTRECTOMY CHOLECYSTECTOMY G and J tubes  VDRF - weaning per CCM FEN - adv TF  LOS: 4 days  Check labs AM   Davey Limas E 05/10/2011

## 2011-05-10 NOTE — Progress Notes (Signed)
ANTIBIOTIC CONSULT NOTE - FOLLOW UP  Pharmacy Consult for vancomycin + micafungin Indication: rule out sepsis and bowel perf  Allergies  Allergen Reactions  . Other Swelling    Pinto beans cause swelling and hives  . Penicillins Swelling    Patient Measurements: Height: 5\' 6"  (167.6 cm) Weight: 198 lb 3.1 oz (89.9 kg) IBW/kg (Calculated) : 59.3   Vital Signs: Temp: 99.1 F (37.3 C) (11/11 1155) Temp src: Oral (11/11 1155) BP: 154/90 mmHg (11/11 1245) Pulse Rate: 124  (11/11 1245) Intake/Output from previous day: 11/10 0701 - 11/11 0700 In: 2140 [I.V.:1130; NG/GT:120; IV Piggyback:660] Out: 950 [Urine:480; Drains:470] Intake/Output from this shift: Total I/O In: 50 [IV Piggyback:50] Out: -   Labs:  Basename 05/10/11 0500 05/09/11 0350 05/08/11 0515 05/07/11 1950 05/07/11 1538  WBC -- 10.1 -- 3.1* --  HGB -- 9.8* -- 11.2* 8.2*  PLT -- 239 -- 326 --  LABCREA -- -- -- -- --  CREATININE 0.51 0.63 0.97 -- --   Estimated Creatinine Clearance: 66.5 ml/min (by C-G formula based on Cr of 0.51). No results found for this basename: VANCOTROUGH:2,VANCOPEAK:2,VANCORANDOM:2,GENTTROUGH:2,GENTPEAK:2,GENTRANDOM:2,TOBRATROUGH:2,TOBRAPEAK:2,TOBRARND:2,AMIKACINPEAK:2,AMIKACINTROU:2,AMIKACIN:2, in the last 72 hours   Microbiology: Recent Results (from the past 720 hour(s))  URINE CULTURE     Status: Normal   Collection Time   04/14/11  6:24 AM      Component Value Range Status Comment   Specimen Description URINE, CLEAN CATCH   Final    Special Requests NONE   Final    Setup Time [id number]   Final    Colony Count 45,000 COLONIES/ML   Final    Culture     Final    Value: Multiple bacterial morphotypes present, none predominant. Suggest appropriate recollection if clinically indicated.   Report Status 04/15/2011 FINAL   Final   SURGICAL PCR SCREEN     Status: Abnormal   Collection Time   05/07/11  9:30 AM      Component Value Range Status Comment   MRSA, PCR NEGATIVE  NEGATIVE   Final    Staphylococcus aureus POSITIVE (*) NEGATIVE  Final   URINE CULTURE     Status: Normal   Collection Time   05/07/11 10:20 AM      Component Value Range Status Comment   Specimen Description URINE, RANDOM   Final    Special Requests NONE   Final    Setup Time [id number]   Final    Colony Count NO GROWTH   Final    Culture NO GROWTH   Final    Report Status 05/08/2011 FINAL   Final   CULTURE, BLOOD (ROUTINE X 2)     Status: Normal (Preliminary result)   Collection Time   05/07/11 11:20 AM      Component Value Range Status Comment   Specimen Description BLOOD   Final    Special Requests     Final    Value: BOTTLES DRAWN AEROBIC AND ANAEROBIC 5CC RT SUBCLAVIAN VEIN   Setup Time [id number]   Final    Culture     Final    Value:        BLOOD CULTURE RECEIVED NO GROWTH TO DATE CULTURE WILL BE HELD FOR 5 DAYS BEFORE ISSUING A FINAL NEGATIVE REPORT   Report Status PENDING   Incomplete   CULTURE, BLOOD (ROUTINE X 2)     Status: Normal (Preliminary result)   Collection Time   05/07/11 11:20 AM      Component Value Range Status Comment  Specimen Description BLOOD   Final    Special Requests     Final    Value: BOTTLES DRAWN AEROBIC AND ANAEROBIC 5CC RT SUBCLAVIAN VEIN   Setup Time [id number]   Final    Culture     Final    Value:        BLOOD CULTURE RECEIVED NO GROWTH TO DATE CULTURE WILL BE HELD FOR 5 DAYS BEFORE ISSUING A FINAL NEGATIVE REPORT   Report Status PENDING   Incomplete     Anti-infectives     Start     Dose/Rate Route Frequency Ordered Stop   05/07/11 1400   imipenem-cilastatin (PRIMAXIN) 500 mg in sodium chloride 0.9 % 100 mL IVPB        500 mg 200 mL/hr over 30 Minutes Intravenous 3 times per day 05/07/11 1106     05/07/11 1200   micafungin (MYCAMINE) 100 mg in sodium chloride 0.9 % 100 mL IVPB     Comments: PER PHARMACY      100 mg 100 mL/hr over 1 Hours Intravenous Every 24 hours 05/07/11 1032     05/07/11 1200   vancomycin (VANCOCIN) 1,250 mg in  sodium chloride 0.9 % 250 mL IVPB        1,250 mg 166.7 mL/hr over 90 Minutes Intravenous Every 24 hours 05/07/11 1106     05/07/11 1000   micafungin (MYCAMINE) 150 mg in sodium chloride 0.9 % 100 mL IVPB  Status:  Discontinued     Comments: PER PHARMACY      150 mg 100 mL/hr over 1 Hours Intravenous Daily 05/07/11 0951 05/07/11 1029   05/07/11 0300   ertapenem (INVANZ) 1 g in sodium chloride 0.9 % 50 mL IVPB        1 g 100 mL/hr over 30 Minutes Intravenous  Once 05/07/11 0300 05/07/11 0348   05/07/11 0130   cefTRIAXone (ROCEPHIN) 1 g in dextrose 5 % 50 mL IVPB        1 g 100 mL/hr over 30 Minutes Intravenous  Once 05/07/11 0122 05/07/11 0200          Assessment: Pt on vancomycin + primaxin + micafungin for empiric treatment of bowel perf. Pt is afebrile and cultures are negative to date. CCS recommended de-escalating to primaxin alone. Vancomycin is at steady state and since it is continuing for now, will order a trough.   Goal of Therapy:  Vancomycin trough level 15-20 mcg/ml  Plan:  Vancomycin trough at 1130am on 11/12  Daylene Vandenbosch, Drake Leach 05/10/2011,2:44 PM

## 2011-05-10 NOTE — Progress Notes (Signed)
*  PRELIMINARY RESULTS* Echocardiogram 2D Echocardiogram has been performed.  Clide Deutscher RDCS 05/10/2011, 9:47 AM

## 2011-05-11 ENCOUNTER — Inpatient Hospital Stay (HOSPITAL_COMMUNITY): Payer: Medicare HMO

## 2011-05-11 DIAGNOSIS — R6521 Severe sepsis with septic shock: Secondary | ICD-10-CM

## 2011-05-11 DIAGNOSIS — J9600 Acute respiratory failure, unspecified whether with hypoxia or hypercapnia: Secondary | ICD-10-CM

## 2011-05-11 DIAGNOSIS — A419 Sepsis, unspecified organism: Secondary | ICD-10-CM

## 2011-05-11 DIAGNOSIS — K631 Perforation of intestine (nontraumatic): Secondary | ICD-10-CM

## 2011-05-11 LAB — MAGNESIUM: Magnesium: 2 mg/dL (ref 1.5–2.5)

## 2011-05-11 LAB — BLOOD GAS, ARTERIAL
Acid-base deficit: 2.3 mmol/L — ABNORMAL HIGH (ref 0.0–2.0)
Drawn by: 320991
FIO2: 0.3 %
MECHVT: 450 mL
O2 Saturation: 96.4 %
RATE: 16 resp/min
TCO2: 21.5 mmol/L (ref 0–100)
pCO2 arterial: 27.6 mmHg — ABNORMAL LOW (ref 35.0–45.0)
pO2, Arterial: 82.5 mmHg (ref 80.0–100.0)

## 2011-05-11 LAB — GLUCOSE, CAPILLARY
Glucose-Capillary: 153 mg/dL — ABNORMAL HIGH (ref 70–99)
Glucose-Capillary: 156 mg/dL — ABNORMAL HIGH (ref 70–99)
Glucose-Capillary: 166 mg/dL — ABNORMAL HIGH (ref 70–99)
Glucose-Capillary: 185 mg/dL — ABNORMAL HIGH (ref 70–99)

## 2011-05-11 LAB — COMPREHENSIVE METABOLIC PANEL
ALT: 18 U/L (ref 0–35)
Alkaline Phosphatase: 79 U/L (ref 39–117)
BUN: 20 mg/dL (ref 6–23)
CO2: 23 mEq/L (ref 19–32)
GFR calc Af Amer: >90 mL/min (ref >90)
GFR calc non Af Amer: 85 mL/min — ABNORMAL LOW (ref >90)
Glucose, Bld: 178 mg/dL — ABNORMAL HIGH (ref 70–99)
Potassium: 4 mEq/L (ref 3.5–5.1)
Sodium: 135 mEq/L (ref 135–145)
Total Bilirubin: 0.6 mg/dL (ref 0.3–1.2)

## 2011-05-11 LAB — CBC
MCV: 78.1 fL (ref 78.0–100.0)
Platelets: 268 10*3/uL (ref 150–400)
RBC: 4.24 MIL/uL (ref 3.87–5.11)
RDW: 20.5 % — ABNORMAL HIGH (ref 11.5–15.5)
WBC: 8.7 10*3/uL (ref 4.0–10.5)

## 2011-05-11 LAB — BASIC METABOLIC PANEL
Calcium: 8.8 mg/dL (ref 8.4–10.5)
Creatinine, Ser: 0.65 mg/dL (ref 0.50–1.10)
GFR calc Af Amer: >90 mL/min (ref >90)
GFR calc non Af Amer: 83 mL/min — ABNORMAL LOW (ref >90)
Sodium: 133 mEq/L — ABNORMAL LOW (ref 135–145)

## 2011-05-11 LAB — PRO B NATRIURETIC PEPTIDE: Pro B Natriuretic peptide (BNP): 702.4 pg/mL — ABNORMAL HIGH (ref 0–450)

## 2011-05-11 MED ORDER — MIDAZOLAM BOLUS VIA INFUSION
1.0000 mg | INTRAVENOUS | Status: DC | PRN
  Filled 2011-05-11: qty 2

## 2011-05-11 MED ORDER — SODIUM CHLORIDE 0.9 % IV SOLN
50.0000 ug/h | INTRAVENOUS | Status: DC
  Administered 2011-05-11: 50 ug/h via INTRAVENOUS
  Administered 2011-05-13: 100 ug/h via INTRAVENOUS
  Filled 2011-05-11 (×3): qty 50

## 2011-05-11 MED ORDER — SODIUM CHLORIDE 0.9 % IV SOLN
2.0000 mg/h | INTRAVENOUS | Status: DC
  Administered 2011-05-11: 2 mg/h via INTRAVENOUS
  Administered 2011-05-11: 3 mg/h via INTRAVENOUS
  Administered 2011-05-13: 5 mg/h via INTRAVENOUS
  Filled 2011-05-11 (×3): qty 10

## 2011-05-11 MED ORDER — FUROSEMIDE 10 MG/ML IJ SOLN
40.0000 mg | Freq: Four times a day (QID) | INTRAMUSCULAR | Status: AC
  Administered 2011-05-11 (×3): 40 mg via INTRAVENOUS
  Filled 2011-05-11: qty 4

## 2011-05-11 MED ORDER — POTASSIUM CHLORIDE 20 MEQ/15ML (10%) PO LIQD
40.0000 meq | Freq: Three times a day (TID) | ORAL | Status: AC
  Administered 2011-05-11 (×2): 40 meq
  Filled 2011-05-11 (×2): qty 30

## 2011-05-11 MED ORDER — FENTANYL BOLUS VIA INFUSION
50.0000 ug | Freq: Four times a day (QID) | INTRAVENOUS | Status: DC | PRN
  Filled 2011-05-11: qty 100

## 2011-05-11 NOTE — Progress Notes (Signed)
4 Days Post-Op  Subjective: Pt failed vent wean, now back on full support. Noted emesis, G-tube clog suspected, now flushed with about 500cc return of mixed gastric and TF contents. TF currently being held.   Objective: Vital signs in last 24 hours: Temp:  [97.2 F (36.2 C)-98.9 F (37.2 C)] 98.4 F (36.9 C) (11/12 1154) Pulse Rate:  [124-141] 137  (11/12 0900) Resp:  [24-37] 25  (11/12 0900) BP: (115-168)/(85-119) 168/119 mmHg (11/12 0800) SpO2:  [97 %-100 %] 97 % (11/12 0900) FiO2 (%):  [29.6 %-30.3 %] 30.1 % (11/12 0900) Weight:  [206 lb 5.6 oz (93.6 kg)] 206 lb 5.6 oz (93.6 kg) (11/12 0400)    Intake/Output this shift: Total I/O In: 110 [I.V.:20; Other:60; NG/GT:30] Out: 285 [Urine:150; Drains:135]  Physical Exam: Abdomen: soft. G-tube and J-tube intact, no erythema. Wound partially open, tissues pink, no drainage or odor.  Labs: CBC  Basename 05/11/11 0520 05/09/11 0350  WBC 8.7 10.1  HGB 11.4* 9.8*  HCT 33.1* 27.6*  PLT 268 239   BMET  Basename 05/11/11 0520 05/11/11 0220  NA 133* 135  K 4.1 4.0  CL 101 102  CO2 23 23  GLUCOSE 188* 178*  BUN 21 20  CREATININE 0.65 0.62  CALCIUM 8.8 8.9   LFT  Basename 05/11/11 0220  PROT 5.1*  ALBUMIN 1.5*  AST 15  ALT 18  ALKPHOS 79  BILITOT 0.6  BILIDIR --  IBILI --  LIPASE --   PT/INR No results found for this basename: LABPROT:2,INR:2 in the last 72 hours ABG  Basename 05/11/11 1052 05/09/11 1558  PHART 7.486* 7.450*  HCO3 20.7 23.7    Assessment: Principal Problem:  *Sepsis Active Problems:  DIABETES MELLITUS, TYPE II  HYPERTENSION  Anemia  Hyponatremia  Abdominal pain, acute  Metabolic acidosis  Esophagitis, erosive  s/p Procedure(s): EXPLORATORY LAPAROTOMY GASTRECTOMY GASTROSTOMY, FEEDING JEJUNOSTOMY CHOLECYSTECTOMY Plan: Agree with temp hold of TF, resume trickle feed later today, keep G-tube to LIWS for now, keep flushed. Wound care as ordered.  LOS: 5 days    Marianna Fuss 05/11/2011

## 2011-05-11 NOTE — Progress Notes (Signed)
[name] is a 77 y.o. female transferred from [facility] on 05/06/2011 with headache and abd pain.  Found to have intraperitoneal free air, and perforated pylorus complicated by septic shock and VDRF. PMHx HTN, DM, GERD, Dementia, Depression, OA, Pancreatitis, Angiomyolipoma Rt kidney, Anemai  Lines/tubes  Left IJ 11/8>>>  ETT 11/8>>>  Rt fem aline 11/8>>> 11/10 Foley 11/7 (at [facility]) >>  Microbiology/Sepsis markers:  MSSA PCR 11/18 - POSITIVE MRSA PCR 11/18 - NEGATIVE BCx2 11/8>>>NTD Urine 11/8>>> NTD Lactate 11/8>>> 3 Pct 11/8>>>  130 Cortisol 11/8 >> 58 ................... PCt 11/11 - 1.4 Lactate 11/11 - 7.24  Anti-infectives:  Vanc 11/8>>>  Primaxin 11/8>>>  Micofungin 11/8>>>   Best Practice/Protocols:  DVT - SCD's  PPI - protonix  Sepsis protocol 11/8  ICU hyperglycemia protocol 11/8   Consults:  Surgery   Studies/events:  11/7: CT scan>>free air 11/7: Ex lap for perforated pyloric ulcer 11/11 - failed SBT after few hours, .     SUBJECTIVE: WUA -  Agitation. SBT -  Failed due to high RR and agitation, N/V. Significant 3rd spacing  + Oliguuric too  OBJECTIVE:  Blood pressure 168/119, pulse 137, temperature 97.7 F (36.5 C), temperature source Oral, resp. rate 25, height 5\' 6"  (1.676 m), weight 93.6 kg (206 lb 5.6 oz), SpO2 97.00%.   Intake/Output Summary (Last 24 hours) at 05/11/11 1005 Last data filed at 05/11/11 0800  Gross per 24 hour  Intake 2313.2 ml  Output   2055 ml  Net  258.2 ml   PHYSICAL EXAM: General - critically ill appearing, very deconditioned HEENT - ETT in place Cardiac - irregular Chest - scattered rhonchi Abd - wound dressing clean Ext - 3+ edema Neuro - sedated but agitated on WUA but able to follow commands  Lab Results  Component Value Date   WBC 8.7 05/11/2011   HGB 11.4* 05/11/2011   HCT 33.1* 05/11/2011   MCV 78.1 05/11/2011   PLT 268 05/11/2011   CMP     Component Value Date/Time   NA 133* 05/11/2011 0520     K 4.1 05/11/2011 0520   CL 101 05/11/2011 0520   CO2 23 05/11/2011 0520   GLUCOSE 188* 05/11/2011 0520   BUN 21 05/11/2011 0520   CREATININE 0.65 05/11/2011 0520   CALCIUM 8.8 05/11/2011 0520   PROT 5.1* 05/11/2011 0220   ALBUMIN 1.5* 05/11/2011 0220   AST 15 05/11/2011 0220   ALT 18 05/11/2011 0220   ALKPHOS 79 05/11/2011 0220   BILITOT 0.6 05/11/2011 0220   GFRNONAA 83* 05/11/2011 0520   GFRAA >90 05/11/2011 0520       ASSESSMENT/PLAN:  Perforated pyloric ulcer s/p laparotomy 11/07. On d3 of trickle feeds PLAN - Post-op care per surgery, hold TF x4 hours then restart. - Protonix once daily.  #Septic shock secondary to peritonitis. Off pressors  PLAN - Lasix - D5/x primaxin, micafungin, vancomycin  #A fib with RVR this admit but on 11/10 is in sinuse (ECHO 02/09/11 - normal) and trop x 1 is negative on 11/8 PLAN - Monitor heart rate, will use beta blockers if remains tachy, was tachy now to moving the patient per RN.  #Acute respiratory failure with VDRF Plan: - Failed SBT 11/12 due to agitation, will change to versed/fentanyl and recheck. - Diurese - Full vent support until hemodynamics improved - F/U CXR and ABG today. - Bronchial hygiene  #Metabolic acidosis - resolved.  #Oliguria likely from ATN in setting of septic shock PLAN - Increase  lasix  #Hypokalemia Plan - Replace and recheck.  #DM - Hyperglycemia protocol  #Hx of HTN -hold outpt meds  #Anemia - f/u CBC - transfuse for Hb < 7  Critical care time 35 minutes.   Koren Bound Pager:  [phone] 05/11/2011, 10:05 AM

## 2011-05-11 NOTE — Progress Notes (Signed)
I have seen and examined the patient and agree with the assessment and plans  

## 2011-05-12 ENCOUNTER — Inpatient Hospital Stay (HOSPITAL_COMMUNITY): Payer: Medicare HMO

## 2011-05-12 DIAGNOSIS — J9600 Acute respiratory failure, unspecified whether with hypoxia or hypercapnia: Secondary | ICD-10-CM | POA: Diagnosis present

## 2011-05-12 LAB — CBC
HCT: 29.1 % — ABNORMAL LOW (ref 36.0–46.0)
Hemoglobin: 9.7 g/dL — ABNORMAL LOW (ref 12.0–15.0)
RBC: 3.67 MIL/uL — ABNORMAL LOW (ref 3.87–5.11)

## 2011-05-12 LAB — BLOOD GAS, ARTERIAL
Bicarbonate: 24.8 mEq/L — ABNORMAL HIGH (ref 20.0–24.0)
Drawn by: 23404
MECHVT: 450 mL
O2 Saturation: 97 %
PEEP: 5 cmH2O
Patient temperature: 98.6
RATE: 16 resp/min
pH, Arterial: 7.468 — ABNORMAL HIGH (ref 7.350–7.400)

## 2011-05-12 LAB — GLUCOSE, CAPILLARY
Glucose-Capillary: 154 mg/dL — ABNORMAL HIGH (ref 70–99)
Glucose-Capillary: 145 mg/dL — ABNORMAL HIGH (ref 70–99)
Glucose-Capillary: 193 mg/dL — ABNORMAL HIGH (ref 70–99)

## 2011-05-12 LAB — BASIC METABOLIC PANEL
BUN: 23 mg/dL (ref 6–23)
CO2: 26 mEq/L (ref 19–32)
Chloride: 105 mEq/L (ref 96–112)
Glucose, Bld: 162 mg/dL — ABNORMAL HIGH (ref 70–99)
Potassium: 3.8 mEq/L (ref 3.5–5.1)
Sodium: 140 mEq/L (ref 135–145)

## 2011-05-12 LAB — VANCOMYCIN, TROUGH: Vancomycin Tr: 18.4 ug/mL (ref 10.0–20.0)

## 2011-05-12 LAB — PROTIME-INR: Prothrombin Time: 15.3 seconds — ABNORMAL HIGH (ref 11.6–15.2)

## 2011-05-12 MED ORDER — FUROSEMIDE 10 MG/ML IJ SOLN
INTRAMUSCULAR | Status: AC
  Filled 2011-05-12: qty 4

## 2011-05-12 MED ORDER — METOPROLOL TARTRATE 1 MG/ML IV SOLN
2.5000 mg | Freq: Four times a day (QID) | INTRAVENOUS | Status: DC
  Administered 2011-05-12 – 2011-05-14 (×6): 2.5 mg via INTRAVENOUS
  Filled 2011-05-12 (×11): qty 5

## 2011-05-12 MED ORDER — POTASSIUM CHLORIDE 10 MEQ/50ML IV SOLN
10.0000 meq | INTRAVENOUS | Status: AC
  Administered 2011-05-12 (×3): 10 meq via INTRAVENOUS
  Filled 2011-05-12 (×2): qty 50

## 2011-05-12 MED ORDER — MAGNESIUM SULFATE 40 MG/ML IJ SOLN
2.0000 g | Freq: Once | INTRAMUSCULAR | Status: AC
  Administered 2011-05-12: 2 g via INTRAVENOUS
  Filled 2011-05-12: qty 50

## 2011-05-12 MED ORDER — MAGNESIUM SULFATE BOLUS VIA INFUSION
2.0000 g | Freq: Once | INTRAVENOUS | Status: DC
  Filled 2011-05-12: qty 500

## 2011-05-12 MED ORDER — CLINIMIX E/DEXTROSE (5/15) 5 % IV SOLN
INTRAVENOUS | Status: AC
  Administered 2011-05-12: 17:00:00 via INTRAVENOUS
  Filled 2011-05-12: qty 1000

## 2011-05-12 MED ORDER — FUROSEMIDE 10 MG/ML IJ SOLN
40.0000 mg | Freq: Four times a day (QID) | INTRAMUSCULAR | Status: AC
  Administered 2011-05-12 (×3): 40 mg via INTRAVENOUS
  Filled 2011-05-12 (×2): qty 4

## 2011-05-12 MED ORDER — POTASSIUM CHLORIDE 10 MEQ/50ML IV SOLN
INTRAVENOUS | Status: AC
  Administered 2011-05-12: 10 meq via INTRAVENOUS
  Filled 2011-05-12: qty 50

## 2011-05-12 MED ORDER — POTASSIUM CHLORIDE 10 MEQ/100ML IV SOLN
10.0000 meq | INTRAVENOUS | Status: DC
  Administered 2011-05-12: 10 meq via INTRAVENOUS

## 2011-05-12 MED ORDER — FAT EMULSION 20 % IV EMUL
240.0000 mL | INTRAVENOUS | Status: AC
  Administered 2011-05-12: 240 mL via INTRAVENOUS
  Filled 2011-05-12: qty 250

## 2011-05-12 NOTE — Progress Notes (Signed)
HPI:  [name] is a 77 y.o. female transferred from [facility] on 05/06/2011 with headache and abd pain. Found to have intraperitoneal free air, and perforated pylorus complicated by septic shock and VDRF.  PMHx HTN, DM, GERD, Dementia, Depression, OA, Pancreatitis, Angiomyolipoma Rt kidney, Anemia  Antibiotics:   Vanc 11/8>>>  Primaxin 11/8>>>  Micofungin 11/8>>>   Cultures:   MSSA PCR 11/18 - POSITIVE  MRSA PCR 11/18 - NEGATIVE  BCx2 11/8>>>NTD  Urine 11/8>>> NTD  Lactate 11/8>>> 3  Pct 11/8>>> 130  Cortisol 11/8 >> 58  ...................  PCt 11/11 - 1.4  Lactate 11/11 - 7.24  Access:  Left IJ 11/8>>>  ETT 11/8>>>  Rt fem aline 11/8>>> 11/10  Foley 11/7 (at [employer] [facility]) >>  Best Practice: DVT: SCD's GI: Protonix ICU hyperglycemia protocol Sepsis protocol completed.  Subjective:   Physical Exam: Filed Vitals:   05/12/11 0600  BP: 129/71  Pulse: 126  Temp: 100.5  Resp: 21    Intake/Output Summary (Last 24 hours) at 05/12/11 0701 Last data filed at 05/12/11 0600  Gross per 24 hour  Intake 1550.41 ml  Output   3990 ml  Net -2439.59 ml   Vent Mode:  [-] PRVC FiO2 (%):  [29.7 %-30.3 %] 30.1 % Set Rate:  [16 bmp] 16 bmp Vt Set:  [450 mL] 450 mL PEEP:  [4.5 cmH20-5 cmH20] 4.5 cmH20 Plateau Pressure:  [16 cmH20-23 cmH20] 16 cmH20  Neuro: Agitated but withdraws all to commands. Cardiac: RRR, Nl S1/S2, -M/R/G. Pulmonary: Coarse BS diffusely with end exp wheezes. GI: Soft, NT, ND and +BS. Extremities: 2+ edema and -tenderness.  Labs: CBC    Component Value Date/Time   WBC 12.1* 05/12/2011 0505   RBC 3.67* 05/12/2011 0505   HGB 9.7* 05/12/2011 0505   HCT 29.1* 05/12/2011 0505   PLT 249 05/12/2011 0505   MCV 79.3 05/12/2011 0505   MCH 26.4 05/12/2011 0505   MCHC 33.3 05/12/2011 0505   RDW 20.6* 05/12/2011 0505   LYMPHSABS 0.7 05/07/2011 1950   MONOABS 0.1 05/07/2011 1950   EOSABS 0.0 05/07/2011 1950   BASOSABS 0.0 05/07/2011 1950    BMET    Component  Value Date/Time   NA 140 05/12/2011 0505   K 3.8 05/12/2011 0505   CL 105 05/12/2011 0505   CO2 26 05/12/2011 0505   GLUCOSE 162* 05/12/2011 0505   BUN 23 05/12/2011 0505   CREATININE 0.72 05/12/2011 0505   CALCIUM 8.6 05/12/2011 0505   GFRNONAA 81* 05/12/2011 0505   GFRAA >90 05/12/2011 0505   Mg: 1.7 Phos: 4.4  Chest Xray:  pending  Assessment & Plan:  Sepsis (05/07/2011), Erosive esophagitis and acute abdomen.   Assessment: Due to a perforated pyloric ulcer s/p laparotomy 11/07.   Plan:  Continue abx.  F/U on cultures.  Acute respiratory failure (05/07/2011)   Assessment: due to underlying sepsis and septic shock now with a strong fluid overload component.   Plan: Continue vent weaning as tolerated, agitation is a major obstacle.  Titrate O2 as tolerated.  ABG now and adjust vent accordingly.  DIABETES MELLITUS, TYPE II (09/13/2007)   Plan:  TF  ICU hyperglycemia protocol.  HYPERTENSION (09/13/2007)   Assessment: By history.   Plan:  Diurese  Low dose beta blockers.  Anemia (01/26/2011)   Assessment: Chronic disease and inflammation.   Plan: Monitor, no need for transfusion at this point.  Hyponatremia (01/28/2011)   Assessment: Due to volume shifts and resolving.   Plan: Continue to  diurese and monitor.  Abdominal pain, acute (05/07/2011)   Assessment: Due perf.   Plan: Per surgery.  Metabolic acidosis (05/07/2011)   Assessment: Due to sepsis and hyperchloremia.   Plan: Resolving with diureses and better volume control.  Orders:  KCl replacement IV Lasix 40 mg IV q6 hours x3 doses. AM Labs Titrate O2 SBT in AM. TPN IV beta blockers Mg replacement  The patient is critically ill with multiple organ systems failure and requires high complexity decision making for assessment and support, frequent evaluation and titration of therapies, application of advanced monitoring technologies and extensive interpretation of multiple databases. Critical Care Time devoted  to patient care services described in this note is 40 minutes.    Koren Bound, MD

## 2011-05-12 NOTE — Progress Notes (Signed)
  5 Days Post-Op  Subjective: Intubated and sedated.  Is responding somewhat during wake up assessment.  Tube feeds running.  Some still coming out g-tube  Objective: Vital signs in last 24 hours: Temp:  [98.4 F (36.9 C)-100.5 F (38.1 C)] 100.5 F (38.1 C) (11/13 0400) Pulse Rate:  [123-137] 126  (11/13 0600) Resp:  [17-44] 21  (11/13 0600) BP: (108-168)/(63-119) 129/71 mmHg (11/13 0600) SpO2:  [97 %-100 %] 99 % (11/13 0600) FiO2 (%):  [29.7 %-30.3 %] 30.1 % (11/13 0600) Weight:  [198 lb 10.2 oz (90.1 kg)] 198 lb 10.2 oz (90.1 kg) (11/13 0400)    Intake/Output this shift:    Physical Exam: Abdomen slightly distended.  Drains serosang.  Incision stable  Labs: CBC  Basename 05/12/11 0505 05/11/11 0520  WBC 12.1* 8.7  HGB 9.7* 11.4*  HCT 29.1* 33.1*  PLT 249 268   BMET  Basename 05/12/11 0505 05/11/11 0520  NA 140 133*  K 3.8 4.1  CL 105 101  CO2 26 23  GLUCOSE 162* 188*  BUN 23 21  CREATININE 0.72 0.65  CALCIUM 8.6 8.8   LFT  Basename 05/11/11 0220  PROT 5.1*  ALBUMIN 1.5*  AST 15  ALT 18  ALKPHOS 79  BILITOT 0.6  BILIDIR --  IBILI --  LIPASE --   PT/INR  Basename 05/12/11 0505  LABPROT 15.3*  INR 1.18   ABG  Basename 05/12/11 0430 05/11/11 1052  PHART 7.468* 7.486*  HCO3 24.8* 20.7    Studies/Results: Dg Chest Portable 1 View  05/11/2011  *RADIOLOGY REPORT*  Clinical Data: ET tube placement.  PORTABLE CHEST - 1 VIEW  Comparison: Portable chest 05/09/2011.  Findings: The patient's endotracheal tube has been pulled back with the tip now in good position, 3 cm above the carina.  Support apparatus is otherwise unchanged with fatty left chest tube noted. There is no pneumothorax.  Left effusion and basilar airspace disease have improved.  Mild atelectasis in the right base is noted.  Cardiomegaly.  IMPRESSION:  1.  ET tube in good position. 2.  Decreased left effusion and basilar airspace disease.  Original Report Authenticated By: Bernadene Bell.  Maricela Curet, M.D.     Assessment: Principal Problem:  *Sepsis Active Problems:  DIABETES MELLITUS, TYPE II  HYPERTENSION  Anemia  Hyponatremia  Abdominal pain, acute  Metabolic acidosis  Esophagitis, erosive  Acute respiratory failure  s/p Procedure(s): EXPLORATORY LAPAROTOMY GASTROSTOMY GASTRECTOMY CHOLECYSTECTOMY Plan: Check KUB to monitor bowel gas pattern Continue current care per CCM  LOS: 6 days    Darnelle Derrick A 05/12/2011

## 2011-05-12 NOTE — Progress Notes (Signed)
UR Completed.  [name] Jane 05/12/2011  

## 2011-05-12 NOTE — Progress Notes (Signed)
Vancomycin Consult  Vanc trough 18.4 (goal 15-20)  77yo female therapeutic on vanc for r/o sepsis.  Will continue to monitor.  Vernard Gambles, PharmD, BCPS 05/12/2011 3:14 AM

## 2011-05-12 NOTE — Progress Notes (Signed)
PARENTERAL NUTRITION CONSULT NOTE - INITIAL  Pharmacy Consult:  TNA Indication: s/p gastrectomy/cholecystectomy/G tube placement, erosive esophagitis, TF suctioned out of G tube  Allergies  Allergen Reactions  . Other Swelling    Pinto beans cause swelling and hives  . Penicillins Swelling    Patient Measurements: Height: 5\' 6"  (167.6 cm) Weight: 198 lb 10.2 oz (90.1 kg) IBW/kg (Calculated) : 59.3  Adjusted Body Weight: 69kg   Vital Signs: Temp: 101.4 F (38.6 C) (11/13 0700) Temp src: Oral (11/13 0700) BP: 122/76 mmHg (11/13 0700) Pulse Rate: 124  (11/13 0700) Intake/Output from previous day: 11/12 0701 - 11/13 0700 In: 1550.4 [I.V.:322.4; NG/GT:270; IV Piggyback:568] Out: 4015 [Urine:2430; Drains:1585] Intake/Output from this shift: Total I/O In: -  Out: 42 [Urine:25; Drains:17]  Labs:  [facility] 05/12/11 0505 05/11/11 0520  WBC 12.1* 8.7  HGB 9.7* 11.4*  HCT 29.1* 33.1*  PLT 249 268  APTT -- --  INR 1.18 --     Basename 05/12/11 0505 05/11/11 0520 05/11/11 0220 05/10/11 0500  NA 140 133* 135 --  K 3.8 4.1 4.0 --  CL 105 101 102 --  CO2 26 23 23  --  GLUCOSE 162* 188* 178* --  BUN 23 21 20  --  CREATININE 0.72 0.65 0.62 --  LABCREA -- -- -- --  CREAT24HRUR -- -- -- --  CALCIUM 8.6 8.8 8.9 --  MG 1.7 2.0 -- 2.0  PHOS 4.4 3.8 -- 3.1  PROT -- -- 5.1* --  ALBUMIN -- -- 1.5* --  AST -- -- 15 --  ALT -- -- 18 --  ALKPHOS -- -- 79 --  BILITOT -- -- 0.6 --  BILIDIR -- -- -- --  IBILI -- -- -- --  PREALBUMIN -- -- 8.0* --  CHOLHDL -- -- -- --  CHOL -- -- -- --   Estimated Creatinine Clearance: 66.6 ml/min (by C-G formula based on Cr of 0.72).    Basename 05/12/11 0755 05/12/11 0341 05/11/11 2333  GLUCAP 159* 154* 153*    Medical History: Past Medical History  Diagnosis Date  . Hypertension   . Reflux   . Right elbow pain     OTIF  . Dementia   . Depression   . Osteoarthritis   . Pancreatitis 11/2007    HOP  . Angiomyolipoma of kidney    right  . Ulcerative esophagitis 12/05/2007    hx elevated gastrin, severe on EGD by Dr Jena Gauss , h pylori negative  . Hiatal hernia   . S/P colonoscopy 2009    pt reports normal by Dr Lovell Sheehan  . Diabetes mellitus   . Anemia   . Hyponatremia     Medications:  Primaxin, Vanc, Mycamine, Lopressor, Lasix, Protonix  Insulin Requirements in the past 24 hours:  15 units  Current Nutrition:  TF d/c'ed because suctioned out of G tube  Assessment: 55 YOF with perforated pylorus and erosive esophagitis to start TNA s/p gastrectomy.  TF is being suctioned out of G-tube; therefore, nutritional needs not met.  Noted MD supplementing K+ and Mag.  Nutritional Goals:  1200-1500 kcal, > 115 g protein daily per RD  Plan: 1.  Start Clinimix E 5/15 at 40 ml/hr and lipids at 10 ml/hr (goal TNA at ~83 ml/hr with lipid at ~5 ml/hr). 2.  F/U AM labs   Phillips Climes [facility] 05/12/2011,9:21 AM

## 2011-05-13 ENCOUNTER — Inpatient Hospital Stay (HOSPITAL_COMMUNITY): Payer: Medicare HMO

## 2011-05-13 LAB — TRIGLYCERIDES: Triglycerides: 142 mg/dL (ref <150)

## 2011-05-13 LAB — CBC
HCT: 24.8 % — ABNORMAL LOW (ref 36.0–46.0)
Hemoglobin: 8.2 g/dL — ABNORMAL LOW (ref 12.0–15.0)
MCHC: 33.1 g/dL (ref 30.0–36.0)
RBC: 3.07 MIL/uL — ABNORMAL LOW (ref 3.87–5.11)
WBC: 13.6 10*3/uL — ABNORMAL HIGH (ref 4.0–10.5)

## 2011-05-13 LAB — CULTURE, BLOOD (ROUTINE X 2)
Culture  Setup Time: 201211081630
Culture: NO GROWTH
Culture: NO GROWTH

## 2011-05-13 LAB — DIFFERENTIAL
Basophils Relative: 0 % (ref 0–1)
Eosinophils Absolute: 0.1 10*3/uL (ref 0.0–0.7)
Eosinophils Relative: 1 % (ref 0–5)
Lymphocytes Relative: 7 % — ABNORMAL LOW (ref 12–46)
Monocytes Relative: 3 % (ref 3–12)
Neutrophils Relative %: 89 % — ABNORMAL HIGH (ref 43–77)
WBC Morphology: INCREASED

## 2011-05-13 LAB — BLOOD GAS, ARTERIAL
Acid-Base Excess: 5.9 mmol/L — ABNORMAL HIGH (ref 0.0–2.0)
Drawn by: 35235
FIO2: 0.3 %
MECHVT: 450 mL
O2 Saturation: 94.1 %
PEEP: 5 cmH2O
RATE: 16 resp/min
pCO2 arterial: 40.7 mmHg (ref 35.0–45.0)
pO2, Arterial: 71 mmHg — ABNORMAL LOW (ref 80.0–100.0)

## 2011-05-13 LAB — COMPREHENSIVE METABOLIC PANEL
Albumin: 1.4 g/dL — ABNORMAL LOW (ref 3.5–5.2)
BUN: 19 mg/dL (ref 6–23)
Calcium: 8.6 mg/dL (ref 8.4–10.5)
GFR calc Af Amer: >90 mL/min (ref >90)
Glucose, Bld: 187 mg/dL — ABNORMAL HIGH (ref 70–99)
Total Protein: 4.8 g/dL — ABNORMAL LOW (ref 6.0–8.3)

## 2011-05-13 LAB — GLUCOSE, CAPILLARY
Glucose-Capillary: 177 mg/dL — ABNORMAL HIGH (ref 70–99)
Glucose-Capillary: 200 mg/dL — ABNORMAL HIGH (ref 70–99)

## 2011-05-13 LAB — PREALBUMIN: Prealbumin: 8 mg/dL — ABNORMAL LOW (ref 17.0–34.0)

## 2011-05-13 MED ORDER — POTASSIUM CHLORIDE 10 MEQ/50ML IV SOLN
10.0000 meq | INTRAVENOUS | Status: AC
  Administered 2011-05-13 (×6): 10 meq via INTRAVENOUS
  Filled 2011-05-13 (×6): qty 50

## 2011-05-13 MED ORDER — FUROSEMIDE 10 MG/ML IJ SOLN
40.0000 mg | Freq: Four times a day (QID) | INTRAMUSCULAR | Status: AC
  Administered 2011-05-13 – 2011-05-14 (×3): 40 mg via INTRAVENOUS
  Filled 2011-05-13 (×3): qty 4

## 2011-05-13 MED ORDER — FAT EMULSION 20 % IV EMUL
240.0000 mL | INTRAVENOUS | Status: AC
  Administered 2011-05-13: 240 mL via INTRAVENOUS
  Filled 2011-05-13: qty 250

## 2011-05-13 MED ORDER — FENTANYL CITRATE 0.05 MG/ML IJ SOLN
25.0000 ug | INTRAMUSCULAR | Status: DC | PRN
  Administered 2011-05-13 – 2011-05-16 (×11): 50 ug via INTRAVENOUS
  Filled 2011-05-13 (×14): qty 2

## 2011-05-13 MED ORDER — SODIUM CHLORIDE 0.9 % IV SOLN
0.2000 ug/kg/h | INTRAVENOUS | Status: DC
  Administered 2011-05-13: 0.2 ug/kg/h via INTRAVENOUS
  Administered 2011-05-13: 0.8 ug/kg/h via INTRAVENOUS
  Administered 2011-05-13: 0.6 ug/kg/h via INTRAVENOUS
  Administered 2011-05-14 (×5): 0.8 ug/kg/h via INTRAVENOUS
  Filled 2011-05-13 (×13): qty 2

## 2011-05-13 MED ORDER — MIDAZOLAM HCL 2 MG/2ML IJ SOLN
2.0000 mg | INTRAMUSCULAR | Status: DC | PRN
  Administered 2011-05-13: 4 mg via INTRAVENOUS
  Administered 2011-05-14 – 2011-05-16 (×8): 2 mg via INTRAVENOUS
  Filled 2011-05-13: qty 4
  Filled 2011-05-13 (×3): qty 2
  Filled 2011-05-13 (×2): qty 4
  Filled 2011-05-13 (×4): qty 2

## 2011-05-13 MED ORDER — TRACE MINERALS CR-CU-MN-SE-ZN 10-1000-500-60 MCG/ML IV SOLN
INTRAVENOUS | Status: AC
  Administered 2011-05-13: 18:00:00 via INTRAVENOUS
  Filled 2011-05-13: qty 2000

## 2011-05-13 MED ORDER — MAGNESIUM SULFATE 40 MG/ML IJ SOLN
2.0000 g | Freq: Once | INTRAMUSCULAR | Status: AC
  Administered 2011-05-13: 2 g via INTRAVENOUS
  Filled 2011-05-13: qty 50

## 2011-05-13 NOTE — Progress Notes (Signed)
6 Days Post-Op  Subjective: Remain critically ill.  Is moving all extrem. No BM.  KUB yesterday unremarkable.  Objective: Vital signs in last 24 hours: Temp:  [97.6 F (36.4 C)-99.1 F (37.3 C)] 98.7 F (37.1 C) (11/14 0318) Pulse Rate:  [95-123] 117  (11/14 0754) Resp:  [7-35] 21  (11/14 0754) BP: (115-160)/(50-100) 149/66 mmHg (11/14 0754) SpO2:  [93 %-100 %] 93 % (11/14 0754) FiO2 (%):  [29.7 %-30.4 %] 30 % (11/14 0754) Weight:  [192 lb 0.3 oz (87.1 kg)] 192 lb 0.3 oz (87.1 kg) (11/14 0500)    Intake/Output this shift:    Physical Exam: Abdomen soft.  Incision clean.  Drains serosang.  Labs: CBC  Basename 05/13/11 0515 05/12/11 0505  WBC 13.6* 12.1*  HGB 8.2* 9.7*  HCT 24.8* 29.1*  PLT 222 249   BMET  Basename 05/13/11 0515 05/12/11 0505  NA 140 140  K 3.0* 3.8  CL 102 105  CO2 30 26  GLUCOSE 187* 162*  BUN 19 23  CREATININE 0.58 0.72  CALCIUM 8.6 8.6   LFT  Basename 05/13/11 0515  PROT 4.8*  ALBUMIN 1.4*  AST 16  ALT 9  ALKPHOS 73  BILITOT 0.2*  BILIDIR --  IBILI --  LIPASE --   PT/INR  Basename 05/12/11 0505  LABPROT 15.3*  INR 1.18   ABG  Basename 05/13/11 0410 05/12/11 0430  PHART 7.475* 7.468*  HCO3 29.6* 24.8*    Studies/Results: Dg Chest Portable 1 View  05/12/2011  *RADIOLOGY REPORT*  Clinical Data: Endotracheal tube placement  PORTABLE CHEST - 1 VIEW  Comparison: May 11, 2011  Findings: Endotracheal tube is in grossly good position with tip several centimeters above the carina.  No change is noted in position of left internal jugular catheter line.  Increased left lower lobe opacity is noted most consistent with pleural effusion although underlying pneumonia or atelectasis cannot be excluded.  IMPRESSION: Worsening left lower lobe opacity most consistent with pleural effusion and possible underlying pneumonia or atelectasis.  Original Report Authenticated By: Venita Sheffield., M.D.   Dg Chest Portable 1  View  05/11/2011  *RADIOLOGY REPORT*  Clinical Data: ET tube placement.  PORTABLE CHEST - 1 VIEW  Comparison: Portable chest 05/09/2011.  Findings: The patient's endotracheal tube has been pulled back with the tip now in good position, 3 cm above the carina.  Support apparatus is otherwise unchanged with fatty left chest tube noted. There is no pneumothorax.  Left effusion and basilar airspace disease have improved.  Mild atelectasis in the right base is noted.  Cardiomegaly.  IMPRESSION:  1.  ET tube in good position. 2.  Decreased left effusion and basilar airspace disease.  Original Report Authenticated By: Bernadene Bell. Maricela Curet, M.D.   Dg Abd Portable 1v  05/12/2011  *RADIOLOGY REPORT*  Clinical Data: Abdominal distention  ABDOMEN - 1 VIEW  Comparison: 05/07/2011  Findings: There is a gastrostomy tube identified within the left upper quadrant of the abdomen.  The surgical drainage catheters are noted with tips in the projection of the left upper quadrant the abdomen.  The bowel gas pattern appears nonobstructed.  No dilated loops of small bowel or air-fluid levels.  IMPRESSION:  1.  There are no specific features identified to suggest bowel obstruction.  Original Report Authenticated By: Rosealee Albee, M.D.     Assessment: Principal Problem:  *Sepsis Active Problems:  DIABETES MELLITUS, TYPE II  HYPERTENSION  Anemia  Hyponatremia  Abdominal pain, acute  Metabolic  acidosis  Esophagitis, erosive  Acute respiratory failure  s/p Procedure(s): EXPLORATORY LAPAROTOMY GASTROSTOMY GASTRECTOMY CHOLECYSTECTOMY Plan: Patient with increasing WBC and anemia.  Suspect chronic disease.  If WBC persists, may need CT scan to r/o abscess. Transfuse per CCM  LOS: 7 days    Harlow Basley A 05/13/2011

## 2011-05-13 NOTE — Progress Notes (Signed)
PARENTERAL NUTRITION CONSULT NOTE - FOLLOW UP  Pharmacy Consult:  TNA Indication:  s/p gastrectomy/cholecystectomy/G tube placement, erosive esophagitis, TF suctioned out of G tube   Allergies  Allergen Reactions  . Other Swelling    Pinto beans cause swelling and hives  . Penicillins Swelling    Patient Measurements: Height: 5\' 6"  (167.6 cm) Weight: 192 lb 0.3 oz (87.1 kg) IBW/kg (Calculated) : 59.3  Adjusted Body Weight: 69kg  Vital Signs: Temp: 98.7 F (37.1 C) (11/14 0318) Temp src: Oral (11/14 0318) BP: 149/66 mmHg (11/14 0754) Pulse Rate: 117  (11/14 0754) Intake/Output from previous day: 11/13 0701 - 11/14 0700 In: 1537.9 [I.V.:373.9; IV Piggyback:614; TPN:550] Out: 4407 [Urine:3550; Drains:857]     Labs:  [facility] 05/13/11 0515 05/12/11 0505 05/11/11 0520  WBC 13.6* 12.1* 8.7  HGB 8.2* 9.7* 11.4*  HCT 24.8* 29.1* 33.1*  PLT 222 249 268  APTT -- -- --  INR -- 1.18 --     Basename 05/13/11 0515 05/12/11 0505 05/11/11 0520 05/11/11 0220  NA 140 140 133* --  K 3.0* 3.8 4.1 --  CL 102 105 101 --  CO2 30 26 23  --  GLUCOSE 187* 162* 188* --  BUN 19 23 21  --  CREATININE 0.58 0.72 0.65 --  LABCREA -- -- -- --  CREAT24HRUR -- -- -- --  CALCIUM 8.6 8.6 8.8 --  MG 1.7 1.7 2.0 --  PHOS 4.2 4.4 3.8 --  PROT 4.8* -- -- 5.1*  ALBUMIN 1.4* -- -- 1.5*  AST 16 -- -- 15  ALT 9 -- -- 18  ALKPHOS 73 -- -- 79  BILITOT 0.2* -- -- 0.6  BILIDIR -- -- -- --  IBILI -- -- -- --  PREALBUMIN -- -- -- 8.0*  CHOLHDL -- -- -- --  CHOL 84 -- -- --   Estimated Creatinine Clearance: 65.5 ml/min (by C-G formula based on Cr of 0.58).    Basename 05/13/11 0317 05/12/11 2329 05/12/11 1948  GLUCAP 178* 155* 193*    Insulin Requirements in the past 24 hours:  CBGs 153 -193, received 7 units in past 24 hours   Assessment: [name] with perforated pylorus and erosive esophagitis to start TNA s/p gastrectomy. TF is being suctioned out of G-tube; therefore, nutritional needs  not met and pt was switched to TNA.  Noted KUB unremarkable. 1.  Lytes:  Hypokalemia, mild hypercalcemia, others WNL 2.  Endo:  CBGs elevated, currently on ICU SSI Q4H. 3.  Renal function stable, good UOP on Lasix. 4.  LFTs, TC, TG WNL    Nutritional Goals:  1200-1500 kCal, >115 grams of protein per day  Plan:  1.  Increase Clinimix E 5/15 to 60 ml/hr (goal ~83 ml/hr) and continue lipids at 10 ml/hr (goal 5 ml/hr once at goal Clinimix rate).  Did not advance to goal d/t hypokalemia and mild hyperglycemia.  Clinimix and lipids to provide 1502 kcal and 72gm protein daily. 2.  Supplement K+ with 6 runs KCl, will monitor calcium level for now. 3.  Add 10 units regular insulin to TNA. 4.  F/U AM labs, CBGs.   Phillips Climes [facility] 05/13/2011,8:22 AM

## 2011-05-13 NOTE — Progress Notes (Signed)
HPI:  [name] is a 77 y.o. female transferred from [facility] on 05/06/2011 with headache and abd pain. Found to have intraperitoneal free air, and perforated pylorus complicated by septic shock and VDRF.  PMHx HTN, DM, GERD, Dementia, Depression, OA, Pancreatitis, Angiomyolipoma Rt kidney, Anemia  Antibiotics:   Vanc 11/8>>>  Primaxin 11/8>>>  Micofungin 11/8>>>   Cultures:   MSSA PCR 11/18 - POSITIVE  MRSA PCR 11/18 - NEGATIVE  BCx2 11/8>>>NTD  Urine 11/8>>> NTD  Lactate 11/8>>> 3  Pct 11/8>>> 130  Cortisol 11/8 >> 58  ...................  PCt 11/11 - 1.4  Lactate 11/11 - 7.24  Access:  Left IJ 11/8>>>  ETT 11/8>>>  Rt fem aline 11/8>>> 11/10  Foley 11/7 (at [employer]) >>  Best Practice: DVT: SCD's GI: Protonix ICU hyperglycemia protocol Sepsis protocol completed.  Subjective:   Physical Exam: Filed Vitals:   05/12/11 0600  BP: 129/71  Pulse: 126  Temp: 100.5  Resp: 21    Intake/Output Summary (Last 24 hours) at 05/13/11 1128 Last data filed at 05/13/11 1005  Gross per 24 hour  Intake 1514.5 ml  Output   3665 ml  Net -2150.5 ml   Vent Mode:  [-] CPAP FiO2 (%):  [29.7 %-30.4 %] 30.1 % Set Rate:  [16 bmp] 16 bmp Vt Set:  [450 mL] 450 mL PEEP:  [5 cmH20] 5 cmH20 Pressure Support:  [8 cmH20-10 cmH20] 8 cmH20  Neuro: Agitated but follows commands. Cardiac: RRR, Nl S1/S2, -M/R/G. Pulmonary: Coarse BS diffusely with end exp wheezes. GI: Soft, NT, ND and +BS. Extremities: 2+ edema and -tenderness.  Labs: CBC    Component Value Date/Time   WBC 13.6* 05/13/2011 0515   RBC 3.07* 05/13/2011 0515   HGB 8.2* 05/13/2011 0515   HCT 24.8* 05/13/2011 0515   PLT 222 05/13/2011 0515   MCV 80.8 05/13/2011 0515   MCH 26.7 05/13/2011 0515   MCHC 33.1 05/13/2011 0515   RDW 20.6* 05/13/2011 0515   LYMPHSABS 1.0 05/13/2011 0515   MONOABS 0.4 05/13/2011 0515   EOSABS 0.1 05/13/2011 0515   BASOSABS 0.0 05/13/2011 0515    BMET    Component Value Date/Time   NA  140 05/13/2011 0515   K 3.0* 05/13/2011 0515   CL 102 05/13/2011 0515   CO2 30 05/13/2011 0515   GLUCOSE 187* 05/13/2011 0515   BUN 19 05/13/2011 0515   CREATININE 0.58 05/13/2011 0515   CALCIUM 8.6 05/13/2011 0515   GFRNONAA 87* 05/13/2011 0515   GFRAA >90 05/13/2011 0515   Mg: 1.7 Phos: 4.4  Chest Xray:  pending  Assessment & Plan:  Sepsis (05/07/2011), Erosive esophagitis and acute abdomen.   Assessment: Due to a perforated pyloric ulcer s/p laparotomy 11/07.   Plan:  Continue abx.  F/U on cultures.  If remains febrile or WBC continues to rise will need abd/pelvic CT for ?abscess.  Diurese.  Acute respiratory failure (05/07/2011)   Assessment: due to underlying sepsis and septic shock now with a strong fluid overload component.   Plan: Continue vent weaning as tolerated, agitation is a major obstacle.  Titrate O2 as tolerated.  ABG now and adjust vent accordingly.  Diurese.  DIABETES MELLITUS, TYPE II (09/13/2007)   Plan:  TF  ICU hyperglycemia protocol.  HYPERTENSION (09/13/2007)   Assessment: By history.   Plan:  Diurese  Low dose beta blockers.  Anemia (01/26/2011)   Assessment: Chronic disease and inflammation.   Plan:  Monitor, no need for transfusion at this point.  Hyponatremia (01/28/2011)   Assessment: Due to volume shifts and resolving.   Plan:  Continue to diurese and monitor.  Abdominal pain, acute (05/07/2011)   Assessment: Due perf.   Plan:  Per surgery.  Metabolic acidosis (05/07/2011)   Assessment: Due to sepsis and hyperchloremia.   Plan:  Resolving with diureses and better volume control.   The patient is critically ill with multiple organ systems failure and requires high complexity decision making for assessment and support, frequent evaluation and titration of therapies, application of advanced monitoring technologies and extensive interpretation of multiple databases. Critical Care Time devoted to patient care services described in this note  is 40 minutes.    Koren Bound, MD

## 2011-05-13 NOTE — Telephone Encounter (Signed)
Currently in hospital

## 2011-05-14 ENCOUNTER — Inpatient Hospital Stay (HOSPITAL_COMMUNITY): Payer: Medicare HMO

## 2011-05-14 DIAGNOSIS — J9600 Acute respiratory failure, unspecified whether with hypoxia or hypercapnia: Secondary | ICD-10-CM

## 2011-05-14 DIAGNOSIS — A419 Sepsis, unspecified organism: Secondary | ICD-10-CM

## 2011-05-14 DIAGNOSIS — R6521 Severe sepsis with septic shock: Secondary | ICD-10-CM

## 2011-05-14 DIAGNOSIS — K631 Perforation of intestine (nontraumatic): Secondary | ICD-10-CM

## 2011-05-14 LAB — COMPREHENSIVE METABOLIC PANEL
ALT: 14 U/L (ref 0–35)
Alkaline Phosphatase: 74 U/L (ref 39–117)
CO2: 32 mEq/L (ref 19–32)
Chloride: 99 mEq/L (ref 96–112)
GFR calc Af Amer: >90 mL/min (ref >90)
GFR calc non Af Amer: 90 mL/min — ABNORMAL LOW (ref >90)
Glucose, Bld: 196 mg/dL — ABNORMAL HIGH (ref 70–99)
Potassium: 2.9 mEq/L — ABNORMAL LOW (ref 3.5–5.1)
Sodium: 140 mEq/L (ref 135–145)

## 2011-05-14 LAB — BLOOD GAS, ARTERIAL
Bicarbonate: 32 mEq/L — ABNORMAL HIGH (ref 20.0–24.0)
MECHVT: 450 mL
PEEP: 5 cmH2O
TCO2: 33.2 mmol/L (ref 0–100)
pCO2 arterial: 39.6 mmHg (ref 35.0–45.0)
pH, Arterial: 7.518 — ABNORMAL HIGH (ref 7.350–7.400)

## 2011-05-14 LAB — CBC
HCT: 23.9 % — ABNORMAL LOW (ref 36.0–46.0)
MCH: 26.1 pg (ref 26.0–34.0)
MCV: 81 fL (ref 78.0–100.0)
Platelets: 237 10*3/uL (ref 150–400)
RDW: 20.7 % — ABNORMAL HIGH (ref 11.5–15.5)

## 2011-05-14 LAB — GLUCOSE, CAPILLARY
Glucose-Capillary: 207 mg/dL — ABNORMAL HIGH (ref 70–99)
Glucose-Capillary: 171 mg/dL — ABNORMAL HIGH (ref 70–99)
Glucose-Capillary: 192 mg/dL — ABNORMAL HIGH (ref 70–99)
Glucose-Capillary: 221 mg/dL — ABNORMAL HIGH (ref 70–99)

## 2011-05-14 MED ORDER — POTASSIUM CHLORIDE 10 MEQ/50ML IV SOLN
10.0000 meq | INTRAVENOUS | Status: AC
  Administered 2011-05-14 (×6): 10 meq via INTRAVENOUS
  Filled 2011-05-14 (×4): qty 50

## 2011-05-14 MED ORDER — POTASSIUM CHLORIDE 10 MEQ/50ML IV SOLN
INTRAVENOUS | Status: AC
  Administered 2011-05-14: 10 meq via INTRAVENOUS
  Filled 2011-05-14: qty 50

## 2011-05-14 MED ORDER — SODIUM CHLORIDE 0.9 % IJ SOLN: 10.0000 mL | INTRAMUSCULAR | Status: DC | PRN

## 2011-05-14 MED ORDER — INSULIN REGULAR HUMAN 100 UNIT/ML IJ SOLN
INTRAVENOUS | Status: DC
  Administered 2011-05-14: 18:00:00 via INTRAVENOUS
  Filled 2011-05-14: qty 2000

## 2011-05-14 MED ORDER — VITAL 1.5 CAL PO LIQD
1000.0000 mL | ORAL | Status: DC
  Administered 2011-05-14 – 2011-05-16 (×3): 1000 mL
  Filled 2011-05-14 (×7): qty 1000

## 2011-05-14 MED ORDER — MAGNESIUM SULFATE 40 MG/ML IJ SOLN
2.0000 g | Freq: Once | INTRAMUSCULAR | Status: AC
  Administered 2011-05-14: 2 g via INTRAVENOUS
  Filled 2011-05-14: qty 50

## 2011-05-14 MED ORDER — METOPROLOL TARTRATE 1 MG/ML IV SOLN
2.5000 mg | Freq: Four times a day (QID) | INTRAVENOUS | Status: DC
  Administered 2011-05-14 – 2011-05-16 (×3): 2.5 mg via INTRAVENOUS
  Filled 2011-05-14 (×12): qty 5

## 2011-05-14 MED ORDER — PRO-STAT SUGAR FREE PO LIQD
30.0000 mL | Freq: Four times a day (QID) | ORAL | Status: DC
  Filled 2011-05-14 (×3): qty 30

## 2011-05-14 MED ORDER — FAT EMULSION 20 % IV EMUL
240.0000 mL | INTRAVENOUS | Status: DC
  Administered 2011-05-14: 240 mL via INTRAVENOUS
  Filled 2011-05-14: qty 250

## 2011-05-14 MED ORDER — PRO-STAT SUGAR FREE PO LIQD
30.0000 mL | Freq: Four times a day (QID) | ORAL | Status: DC
  Administered 2011-05-14 – 2011-05-17 (×12): 30 mL
  Filled 2011-05-14 (×21): qty 30

## 2011-05-14 MED ORDER — FUROSEMIDE 10 MG/ML IJ SOLN
40.0000 mg | Freq: Four times a day (QID) | INTRAMUSCULAR | Status: AC
  Administered 2011-05-14 (×3): 40 mg via INTRAVENOUS
  Filled 2011-05-14 (×3): qty 4

## 2011-05-14 NOTE — Progress Notes (Signed)
eLink Physician-Brief Progress Note Patient Name: [name] DOB: 21-Feb-1932 MRN: [id number]  Date of Service  05/14/2011   HPI/Events of Note   Hypokalemia  eICU Interventions  Potassium replaced   DETERDING,ELIZABETH 05/14/2011, 6:03 AM

## 2011-05-14 NOTE — Progress Notes (Signed)
PARENTERAL NUTRITION CONSULT NOTE - FOLLOW UP  Pharmacy Consult:  TNA Indication: s/p gastrectomy/cholecystectomy/G tube placement, erosive esophagitis, TF suctioned out of G tube    Allergies  Allergen Reactions  . Other Swelling    Pinto beans cause swelling and hives  . Penicillins Swelling    Patient Measurements: Height: 5\' 6"  (167.6 cm) Weight: 192 lb 0.3 oz (87.1 kg) IBW/kg (Calculated) : 59.3    Vital Signs: Temp: 98 F (36.7 C) (11/15 0754) Temp src: Oral (11/15 0754) BP: 104/50 mmHg (11/15 0900) Pulse Rate: 71  (11/15 0900) Intake/Output from previous day: 11/14 0701 - 11/15 0700 In: 1803 [I.V.:181.4; IV Piggyback:1000; TPN:621.7] Out: 4194 [Urine:3500; Drains:694] Intake/Output from this shift: Total I/O In: 1133.6 [I.V.:243.6; IV Piggyback:100; TPN:790] Out: 100 [Urine:100]  Labs:  [facility] 05/14/11 0426 05/13/11 0515 05/12/11 0505  WBC 13.8* 13.6* 12.1*  HGB 7.7* 8.2* 9.7*  HCT 23.9* 24.8* 29.1*  PLT 237 222 249  APTT -- -- --  INR -- -- 1.18     Basename 05/14/11 0426 05/13/11 0515 05/12/11 0505  NA 140 140 140  K 2.9* 3.0* 3.8  CL 99 102 105  CO2 32 30 26  GLUCOSE 196* 187* 162*  BUN 19 19 23   CREATININE 0.52 0.58 0.72  LABCREA -- -- --  CREAT24HRUR -- -- --  CALCIUM 8.3* 8.6 8.6  MG 1.6 1.7 1.7  PHOS 4.3 4.2 4.4  PROT 4.9* 4.8* --  ALBUMIN 1.4* 1.4* --  AST 17 16 --  ALT 14 9 --  ALKPHOS 74 73 --  BILITOT 0.2* 0.2* --  BILIDIR -- -- --  IBILI -- -- --  PREALBUMIN -- 8.0* --  CHOLHDL -- -- --  CHOL -- 84 --   Estimated Creatinine Clearance: 65.5 ml/min (by C-G formula based on Cr of 0.52).    Basename 05/14/11 0833 05/14/11 0419 05/14/11 0018  GLUCAP 207* 184* 195*    Assessment: [name] with perforated pylorus and erosive esophagitis to start TNA s/p gastrectomy. TF is being suctioned out of G-tube; therefore, nutritional needs not met and pt was initiated on TNA on 05/12/11.  Noted TF to resume today per GI.  1. Lytes:  Hypokalemia, others WNL  2. Endo: CBGs elevated, currently on ICU SSI Q4H and 10 units regular insulin in TNA.  Patient received 14 units SSI yesterday. 3. Renal function stable, good UOP, now off Lasix.   Nutritional Goals:  1200-1500 kCal, >115 grams of protein per day  Plan:  1.  Continue Clinimix E 5/15 at 60 ml/hr (goal ~83 ml/hr) and lipids at 10 ml/hr (goal 5 ml/hr once at goal Clinimix rate).  Will not advance to goal rates d/t hypokalemia, hyperglycemia, and TF resumption. 2.  KCl x 6 runs per MD.  Consider rechecking K+ later today and supplement as necessary. 3.  Increase insulin in TNA to 20 units/bag.  Expect CBGs to remain elevated as TF is being resumed. 4.  F/u daily.  [name] 05/14/2011,10:04 AM

## 2011-05-14 NOTE — Progress Notes (Signed)
7 Days Post-Op  Subjective: Intubated and sedated, but weening.  Tube feeds on hold.  Objective: Vital signs in last 24 hours: Temp:  [98.9 F (37.2 C)-99.1 F (37.3 C)] 98.9 F (37.2 C) (11/14 2048) Pulse Rate:  [66-156] 74  (11/15 0754) Resp:  [7-31] 18  (11/15 0754) BP: (83-174)/(41-112) 96/45 mmHg (11/15 0754) SpO2:  [92 %-100 %] 98 % (11/15 0754) FiO2 (%):  [29.7 %-30.3 %] 30 % (11/15 0754)    Intake/Output from previous day: 11/14 0701 - 11/15 0700 In: 1803 [I.V.:181.4; IV Piggyback:1000; TPN:621.7] Out: 4194 [Urine:3500; Drains:694] Intake/Output this shift:    Abdomen soft, non tender.  Tubes stable.  Drains remain serosang. without bile  Lab Results:   [facility] 05/14/11 0426 05/13/11 0515  WBC 13.8* 13.6*  HGB 7.7* 8.2*  HCT 23.9* 24.8*  PLT 237 222   BMET  Basename 05/14/11 0426 05/13/11 0515  NA 140 140  K 2.9* 3.0*  CL 99 102  CO2 32 30  GLUCOSE 196* 187*  BUN 19 19  CREATININE 0.52 0.58  CALCIUM 8.3* 8.6   PT/INR  Basename 05/12/11 0505  LABPROT 15.3*  INR 1.18   ABG  Basename 05/14/11 0443 05/13/11 0410  PHART 7.518* 7.475*  HCO3 32.0* 29.6*    Studies/Results: Dg Chest Port 1 View  05/14/2011  *RADIOLOGY REPORT*  Clinical Data: Evaluate ET tube placement  PORTABLE CHEST - 1 VIEW  Comparison: 05/13/2011  Findings: The endotracheal tube tip is above the carina.  There is a left IJ catheter with tip in the SVC.  Stable cardiac enlargement.  Lung volumes are low and there is interstitial edema bilaterally. Pleural effusions are also noted.  IMPRESSION:  1.  Low lung volumes and congestive heart failure. 2.  Stable position of ET tube.  Original Report Authenticated By: Rosealee Albee, M.D.   Dg Chest Port 1 View  05/13/2011  *RADIOLOGY REPORT*  Clinical Data: Respiratory failure.  PORTABLE CHEST - 1 VIEW  Comparison: 05/12/2011  Findings: Endotracheal tube and left jugular vein catheter appear in good position, unchanged.  Bibasilar  atelectasis and possible left effusion are essentially unchanged.  IMPRESSION: No significant change.  Bibasilar atelectasis and possible left effusion.  Original Report Authenticated By: Gwynn Burly, M.D.   Dg Abd Portable 1v  05/12/2011  *RADIOLOGY REPORT*  Clinical Data: Abdominal distention  ABDOMEN - 1 VIEW  Comparison: 05/07/2011  Findings: There is a gastrostomy tube identified within the left upper quadrant of the abdomen.  The surgical drainage catheters are noted with tips in the projection of the left upper quadrant the abdomen.  The bowel gas pattern appears nonobstructed.  No dilated loops of small bowel or air-fluid levels.  IMPRESSION:  1.  There are no specific features identified to suggest bowel obstruction.  Original Report Authenticated By: Rosealee Albee, M.D.    Anti-infectives: Anti-infectives     Start     Dose/Rate Route Frequency Ordered Stop   05/07/11 1400   imipenem-cilastatin (PRIMAXIN) 500 mg in sodium chloride 0.9 % 100 mL IVPB        500 mg 200 mL/hr over 30 Minutes Intravenous 3 times per day 05/07/11 1106     05/07/11 1200   micafungin (MYCAMINE) 100 mg in sodium chloride 0.9 % 100 mL IVPB     Comments: PER PHARMACY      100 mg 100 mL/hr over 1 Hours Intravenous Every 24 hours 05/07/11 1032     05/07/11 1200   vancomycin (  VANCOCIN) 1,250 mg in sodium chloride 0.9 % 250 mL IVPB        1,250 mg 166.7 mL/hr over 90 Minutes Intravenous Every 24 hours 05/07/11 1106     05/07/11 1000   micafungin (MYCAMINE) 150 mg in sodium chloride 0.9 % 100 mL IVPB  Status:  Discontinued     Comments: PER PHARMACY      150 mg 100 mL/hr over 1 Hours Intravenous Daily 05/07/11 0951 05/07/11 1029   05/07/11 0300   ertapenem (INVANZ) 1 g in sodium chloride 0.9 % 50 mL IVPB        1 g 100 mL/hr over 30 Minutes Intravenous  Once 05/07/11 0300 05/07/11 0348   05/07/11 0130   cefTRIAXone (ROCEPHIN) 1 g in dextrose 5 % 50 mL IVPB        1 g 100 mL/hr over 30 Minutes  Intravenous  Once 05/07/11 0122 05/07/11 0200          Assessment/Plan: s/p Procedure(s): EXPLORATORY LAPAROTOMY GASTROSTOMY GASTRECTOMY CHOLECYSTECTOMY Ok to head toward extubation from gen surg standpoint. Will try and resume tube feeds  LOS: 8 days    [name] A 05/14/2011

## 2011-05-14 NOTE — Plan of Care (Signed)
Problem: Inadequate Intake (NI-2.1) Goal: Food and/or nutrient delivery Individualized approach for food/nutrient provision.  Outcome: Not Progressing TF previously held will resume vital 1.5 @10ml /hr increase by 10ml q8h as tolerated to goal rate of 6ml/hr prostat 64 30ml four times daily

## 2011-05-14 NOTE — Progress Notes (Signed)
Nutrition Follow-up  Meds:     . antiseptic oral rinse  15 mL Mouth Rinse q12n4p  . chlorhexidine  15 mL Mouth Rinse BID  . furosemide  40 mg Intravenous Q6H  . furosemide  40 mg Intravenous Q6H  . imipenem-cilastatin  500 mg Intravenous Q8H  . insulin aspart  0-4 Units Subcutaneous Q4H  . lip balm  1 application Topical BID  . magnesium sulfate IVPB  2 g Intravenous Once  . metoprolol  2.5 mg Intravenous Q6H  . micafungin (MYCAMINE) IV  100 mg Intravenous Q24H  . pantoprazole (PROTONIX) IV  40 mg Intravenous Q12H  . potassium chloride  10 mEq Intravenous Q1 Hr x 6  . potassium chloride  10 mEq Intravenous Q1 Hr x 6  . vancomycin  1,250 mg Intravenous Q24H  . DISCONTD: metoprolol  2.5 mg Intravenous Q6H    Labs:  BMET    Component Value Date/Time   NA 140 05/14/2011 0426   K 2.9* 05/14/2011 0426   CL 99 05/14/2011 0426   CO2 32 05/14/2011 0426   GLUCOSE 196* 05/14/2011 0426   BUN 19 05/14/2011 0426   CREATININE 0.52 05/14/2011 0426   CALCIUM 8.3* 05/14/2011 0426   GFRNONAA 90* 05/14/2011 0426   GFRAA >90 05/14/2011 0426     Wt. Status: 192lbs approx. 5lbs wt loss x 5 days likely fluid related pt being diuresed  Diet: NPO, TF held due to suspected clog of tube pt started on PN currently receiving clinimix E5/15 @60ml /hr 20% lipids 57ml/hr to provide 72gm Prot/1502kcals meeting 100% energy/approx 60%prot needs.  Nutr dx: Inadequate oral intake- ongoing  Goal: meet greater than or equal to 60% energy/100% prot needs per ASPEN guidelines for critically ill obese patients  Intervention: 1. Start vital 1.5@ 49ml/hr increase by 10ml q8h as tolerated to goal rate 32ml/hr to provide 1260kcals/57gmprot/676mlH20  2. Prostat 64 30 ml four times daily to provide 60gmprot/288kcals  Total 1548kcals/117gmprot 3. Titrate PN when EN started per pharmacy   Monitor: TF tolerance/adequacy, wt trends, labs   Pager #: [phone]

## 2011-05-14 NOTE — Progress Notes (Signed)
HPI:  [name] is a 77 y.o. female transferred from [facility] on 05/06/2011 with headache and abd pain. Found to have intraperitoneal free air, and perforated pylorus complicated by septic shock and VDRF.  PMHx HTN, DM, GERD, Dementia, Depression, OA, Pancreatitis, Angiomyolipoma Rt kidney, Anemia  Antibiotics:   Vanc 11/8>>>  Primaxin 11/8>>>  Micofungin 11/8>>>   Cultures:   MSSA PCR 11/18 - POSITIVE  MRSA PCR 11/18 - NEGATIVE  BCx2 11/8>>>NTD  Urine 11/8>>> NTD  Lactate 11/8>>> 3  Pct 11/8>>> 130  Cortisol 11/8 >> 58  ...................  PCt 11/11 - 1.4  Lactate 11/11 - 7.24  Access:  Left IJ 11/8>>>  ETT 11/8>>>  Rt fem aline 11/8>>> 11/10  Foley 11/7 (at [employer]) >>  Best Practice: DVT: SCD's GI: Protonix ICU hyperglycemia protocol Sepsis protocol completed.  Subjective:   Physical Exam: Filed Vitals:   05/12/11 0600  BP: 129/71  Pulse: 126  Temp: 100.5  Resp: 21    Intake/Output Summary (Last 24 hours) at 05/14/11 1112 Last data filed at 05/14/11 1100  Gross per 24 hour  Intake 2707.12 ml  Output   3944 ml  Net -1236.88 ml   Vent Mode:  [-] PRVC FiO2 (%):  [29.7 %-30.3 %] 30 % Set Rate:  [16 bmp] 16 bmp Vt Set:  [450 mL] 450 mL PEEP:  [5 cmH20] 5 cmH20 Pressure Support:  [8 cmH20-10 cmH20] 10 cmH20 Plateau Pressure:  [20 cmH20] 20 cmH20  Neuro: Agitated but follows commands. Cardiac: RRR, Nl S1/S2, -M/R/G. Pulmonary: Coarse BS diffusely with end exp wheezes. GI: Soft, NT, ND and +BS. Extremities: 2+ edema and -tenderness.  Labs: CBC    Component Value Date/Time   WBC 13.8* 05/14/2011 0426   RBC 2.95* 05/14/2011 0426   HGB 7.7* 05/14/2011 0426   HCT 23.9* 05/14/2011 0426   PLT 237 05/14/2011 0426   MCV 81.0 05/14/2011 0426   MCH 26.1 05/14/2011 0426   MCHC 32.2 05/14/2011 0426   RDW 20.7* 05/14/2011 0426   LYMPHSABS 1.0 05/13/2011 0515   MONOABS 0.4 05/13/2011 0515   EOSABS 0.1 05/13/2011 0515   BASOSABS 0.0 05/13/2011 0515     BMET    Component Value Date/Time   NA 140 05/14/2011 0426   K 2.9* 05/14/2011 0426   CL 99 05/14/2011 0426   CO2 32 05/14/2011 0426   GLUCOSE 196* 05/14/2011 0426   BUN 19 05/14/2011 0426   CREATININE 0.52 05/14/2011 0426   CALCIUM 8.3* 05/14/2011 0426   GFRNONAA 90* 05/14/2011 0426   GFRAA >90 05/14/2011 0426   Mg: 1.7 Phos: 4.4  Chest Xray:  pending  Assessment & Plan:  Sepsis (05/07/2011), Erosive esophagitis and acute abdomen.   Assessment: Due to a perforated pyloric ulcer s/p laparotomy 11/07.   Plan:  Continue abx.  Cultures NTD.  Now afebrile, will not need CT, but will continue to monitor for signs of intraabdominal abcess.  Diurese as tolerated.  Acute respiratory failure (05/07/2011)   Assessment: due to underlying sepsis and septic shock now with a strong fluid overload component.   Plan: Continue vent weaning as tolerated, agitation is improved with precedex.  Titrate O2 as tolerated.  ABG noted and adjust vent accordingly.  Diurese.  Will likely need trach next week.  DIABETES MELLITUS, TYPE II (09/13/2007)   Plan:  TF to be restarted through J-tube.  ICU hyperglycemia protocol.  HYPERTENSION (09/13/2007)   Assessment: By history.   Plan:  Diurese.  Continue beta blockers.  Anemia (01/26/2011)   Assessment: Chronic disease and inflammation.   Plan:  Monitor, no need for transfusion at this point.  Hyponatremia (01/28/2011)   Assessment: Due to volume shifts and resolving.   Plan:  Continue to diurese and monitor.  Abdominal pain, acute (05/07/2011)   Assessment: Due perf.   Plan:  Per surgery.  Metabolic acidosis (05/07/2011)   Assessment: Due to sepsis and hyperchloremia.   Plan:  Resolving with diureses and better volume control.   The patient is critically ill with multiple organ systems failure and requires high complexity decision making for assessment and support, frequent evaluation and titration of therapies, application of advanced  monitoring technologies and extensive interpretation of multiple databases. Critical Care Time devoted to patient care services described in this note is 40 minutes.    Koren Bound, MD

## 2011-05-15 LAB — MAGNESIUM: Magnesium: 1.6 mg/dL (ref 1.5–2.5)

## 2011-05-15 LAB — PHOSPHORUS: Phosphorus: 4.1 mg/dL (ref 2.3–4.6)

## 2011-05-15 LAB — GLUCOSE, CAPILLARY

## 2011-05-15 LAB — CBC
Hemoglobin: 7.6 g/dL — ABNORMAL LOW (ref 12.0–15.0)
MCHC: 32.9 g/dL (ref 30.0–36.0)
RBC: 2.85 MIL/uL — ABNORMAL LOW (ref 3.87–5.11)
WBC: 9 10*3/uL (ref 4.0–10.5)

## 2011-05-15 LAB — BASIC METABOLIC PANEL
BUN: 21 mg/dL (ref 6–23)
Chloride: 101 mEq/L (ref 96–112)
GFR calc Af Amer: >90 mL/min (ref >90)
GFR calc non Af Amer: >90 mL/min (ref >90)
Potassium: 2.8 mEq/L — ABNORMAL LOW (ref 3.5–5.1)
Sodium: 141 mEq/L (ref 135–145)

## 2011-05-15 LAB — CULTURE, RESPIRATORY W GRAM STAIN

## 2011-05-15 MED ORDER — POTASSIUM CHLORIDE 10 MEQ/50ML IV SOLN
10.0000 meq | INTRAVENOUS | Status: AC
  Administered 2011-05-15 (×6): 10 meq via INTRAVENOUS
  Filled 2011-05-15 (×6): qty 50

## 2011-05-15 MED ORDER — INSULIN GLARGINE 100 UNIT/ML ~~LOC~~ SOLN
10.0000 [IU] | SUBCUTANEOUS | Status: DC
  Administered 2011-05-15 – 2011-05-17 (×3): 10 [IU] via SUBCUTANEOUS
  Filled 2011-05-15: qty 3

## 2011-05-15 MED ORDER — POTASSIUM CHLORIDE 20 MEQ/15ML (10%) PO LIQD
40.0000 meq | Freq: Three times a day (TID) | ORAL | Status: AC
  Administered 2011-05-15 (×3): 40 meq
  Filled 2011-05-15 (×3): qty 30

## 2011-05-15 MED ORDER — FUROSEMIDE 10 MG/ML IJ SOLN
40.0000 mg | Freq: Four times a day (QID) | INTRAMUSCULAR | Status: AC
  Administered 2011-05-15 – 2011-05-16 (×3): 40 mg via INTRAVENOUS
  Filled 2011-05-15 (×3): qty 4

## 2011-05-15 MED ORDER — MAGNESIUM SULFATE BOLUS VIA INFUSION
2.0000 g | Freq: Once | INTRAVENOUS | Status: AC
  Administered 2011-05-15: 2 g via INTRAVENOUS
  Filled 2011-05-15 (×2): qty 500

## 2011-05-15 MED ORDER — SODIUM CHLORIDE 0.9 % IV SOLN
0.4000 ug/kg/h | INTRAVENOUS | Status: DC
  Administered 2011-05-15 (×3): 1 ug/kg/h via INTRAVENOUS
  Administered 2011-05-15 – 2011-05-16 (×3): 1.1 ug/kg/h via INTRAVENOUS
  Administered 2011-05-16: 1.2 ug/kg/h via INTRAVENOUS
  Administered 2011-05-17: 1.1 ug/kg/h via INTRAVENOUS
  Filled 2011-05-15 (×12): qty 4

## 2011-05-15 NOTE — Progress Notes (Signed)
ANTIBIOTIC CONSULT NOTE - FOLLOW UP  Pharmacy Consult for Vancomycin/Primaxin/Micafungin - Day #8 Indication: s/p bowel perforation - sepsis coverage  Allergies  Allergen Reactions  . Other Swelling    Pinto beans cause swelling and hives  . Penicillins Swelling    Patient Measurements: Height: 5\' 6"  (167.6 cm) Weight: 192 lb 0.3 oz (87.1 kg) IBW/kg (Calculated) : 59.3  Dosing weight: 87 kg   Vital Signs: Temp: 98.9 F (37.2 C) (11/16 0700) Temp src: Oral (11/16 0000) BP: 112/48 mmHg (11/16 1000) Pulse Rate: 85  (11/16 1000) Intake/Output from previous day: 11/15 0701 - 11/16 0700 In: 4678.6 [I.V.:835.6; IV Piggyback:1268; TPN:2350] Out: 3600 [Urine:3125; Drains:475]  Labs:  [facility] 05/15/11 0500 05/14/11 0426 05/13/11 0515  WBC 9.0 13.8* 13.6*  HGB 7.6* 7.7* 8.2*  PLT 253 237 222  LABCREA -- -- --  CREATININE 0.51 0.52 0.58   Estimated Creatinine Clearance: 65.5 ml/min (by C-G formula based on Cr of 0.51).  Microbiology:    Cultures negative to date  Assessment:    Continues on Vancomycin 1250 mg IV q24 hours.       Trough level 18.4 mcg/ml on 11/12. Renal function stable.    Primaxin 500 mg IV q6hrs    Micafungin 100 gm IV q24hrs    WBC 9.0, afebrile, 2 drains in  Goal of Therapy:  Vancomycin trough level 15-20 mcg/ml; Appropriate antibiotic regimens for renal function & infection.  Plan:    Will f/u length of antibiotic therapy and consider rechecking Vanc trough level on 11/19. Will continue to follow renal function for any need to adjust regimens.  Dennie Fetters 05/15/2011,11:18 AM

## 2011-05-15 NOTE — Progress Notes (Signed)
eLink Physician-Brief Progress Note Patient Name: [name] DOB: [birth year] MRN: [id number]  Date of Service  05/15/2011   HPI/Events of Note   Hypokalemia  eICU Interventions  Potassium replaced    Ladarius Seubert 05/15/2011, 6:25 AM

## 2011-05-15 NOTE — Progress Notes (Signed)
HPI:  [name] is a 77 y.o. female transferred from [facility] on 05/06/2011 with headache and abd pain. Found to have intraperitoneal free air, and perforated pylorus complicated by septic shock and VDRF.  PMHx HTN, DM, GERD, Dementia, Depression, OA, Pancreatitis, Angiomyolipoma Rt kidney, Anemia  Antibiotics:   Vanc 11/8>>>  Primaxin 11/8>>>  Micofungin 11/8>>>   Cultures:   MSSA PCR 11/18 - POSITIVE  MRSA PCR 11/18 - NEGATIVE  BCx2 11/8>>>NTD  Urine 11/8>>> NTD  Lactate 11/8>>> 3  Pct 11/8>>> 130  Cortisol 11/8 >> 58  ...................  PCt 11/11 - 1.4  Lactate 11/11 - 7.24  Access:  Left IJ 11/8>>>  ETT 11/8>>>  Rt fem aline 11/8>>>11/10  Foley 11/7 (at [employer]) >>  Best Practice: DVT: SCD's GI: Protonix ICU hyperglycemia protocol Sepsis protocol completed.  Subjective:   Physical Exam: Filed Vitals:   05/12/11 0600  BP: 129/71  Pulse: 126  Temp: 100.5  Resp: 21    Intake/Output Summary (Last 24 hours) at 05/15/11 1032 Last data filed at 05/15/11 0803  Gross per 24 hour  Intake 3596.44 ml  Output   3500 ml  Net  96.44 ml   Vent Mode:  [-] PSV;CPAP FiO2 (%):  [29.4 %-30.2 %] 30 % Set Rate:  [16 bmp] 16 bmp Vt Set:  [450 mL] 450 mL PEEP:  [5 cmH20] 5 cmH20 Pressure Support:  [5 cmH20-8 cmH20] 8 cmH20 Plateau Pressure:  [17 cmH20-20 cmH20] 19 cmH20  Neuro: Agitated but follows commands. Cardiac: RRR, Nl S1/S2, -M/R/G. Pulmonary: Coarse BS diffusely with end exp wheezes. GI: Soft, NT, ND and +BS. Extremities: 2+ edema and -tenderness.  Labs: CBC    Component Value Date/Time   WBC 9.0 05/15/2011 0500   RBC 2.85* 05/15/2011 0500   HGB 7.6* 05/15/2011 0500   HCT 23.1* 05/15/2011 0500   PLT 253 05/15/2011 0500   MCV 81.1 05/15/2011 0500   MCH 26.7 05/15/2011 0500   MCHC 32.9 05/15/2011 0500   RDW 20.8* 05/15/2011 0500   LYMPHSABS 1.0 05/13/2011 0515   MONOABS 0.4 05/13/2011 0515   EOSABS 0.1 05/13/2011 0515   BASOSABS 0.0 05/13/2011 0515      BMET    Component Value Date/Time   NA 141 05/15/2011 0500   K 2.8* 05/15/2011 0500   CL 101 05/15/2011 0500   CO2 33* 05/15/2011 0500   GLUCOSE 186* 05/15/2011 0500   BUN 21 05/15/2011 0500   CREATININE 0.51 05/15/2011 0500   CALCIUM 8.0* 05/15/2011 0500   GFRNONAA >90 05/15/2011 0500   GFRAA >90 05/15/2011 0500   Mg: 1.7 Phos: 4.4  Chest Xray:  pending  Assessment & Plan:  Sepsis (05/07/2011), Erosive esophagitis and acute abdomen.   Assessment: Due to a perforated pyloric ulcer s/p laparotomy 11/07.   Plan:  Continue abx.  Cultures NTD.  Now afebrile, will not need CT, but will continue to monitor for signs of intraabdominal abcess.  Diurese as tolerated.  Acute respiratory failure (05/07/2011)   Assessment: due to underlying sepsis and septic shock now with a strong fluid overload component.   Plan: Continue vent weaning as tolerated, agitation is improved with precedex.  Titrate O2 as tolerated.  ABG noted and adjust vent accordingly.  Diurese.  Will likely need trach next week, will meet with family today.  DIABETES MELLITUS, TYPE II (09/13/2007)   Plan:  TF to be restarted through J-tube.  ICU hyperglycemia protocol.  D/C TPN.  HYPERTENSION (09/13/2007)   Assessment: By history.  Plan:  Diurese.  Continue beta blockers.  Anemia (01/26/2011)   Assessment: Chronic disease and inflammation.   Plan:  Monitor, no need for transfusion at this point.  Hyponatremia (01/28/2011)   Assessment: Due to volume shifts and resolving.   Plan:  Continue to diurese and monitor.  Abdominal pain, acute (05/07/2011)   Assessment: Due perf.   Plan:  Per surgery.  Metabolic acidosis (05/07/2011)   Assessment: Due to sepsis and hyperchloremia.   Plan:  Resolving with diureses and better volume control.   The patient is critically ill with multiple organ systems failure and requires high complexity decision making for assessment and support, frequent evaluation and  titration of therapies, application of advanced monitoring technologies and extensive interpretation of multiple databases. Critical Care Time devoted to patient care services described in this note is 40 minutes.    Koren Bound, MD

## 2011-05-15 NOTE — Progress Notes (Signed)
8 Days Post-Op  Subjective: Remains on vent.   Unable to ween further per CCM.  Tube feeds restarted and tolerating currently at low dose  Objective: Vital signs in last 24 hours: Temp:  [97.8 F (36.6 C)-99.1 F (37.3 C)] 98.9 F (37.2 C) (11/16 0700) Pulse Rate:  [25-95] 73  (11/16 0800) Resp:  [10-33] 23  (11/16 0800) BP: (100-141)/(43-99) 101/50 mmHg (11/16 0800) SpO2:  [66 %-100 %] 100 % (11/16 0800) FiO2 (%):  [29.4 %-30.1 %] 30 % (11/16 0800)    Intake/Output from previous day: 11/15 0701 - 11/16 0700 In: 4608.2 [I.V.:835.2; IV Piggyback:1268; TPN:2280] Out: 3550 [Urine:3075; Drains:475] Intake/Output this shift:    Drains still serosang without obvious bile.  Abdomen otherwise benign  Lab Results:   Basename 05/15/11 0500 05/14/11 0426  WBC 9.0 13.8*  HGB 7.6* 7.7*  HCT 23.1* 23.9*  PLT 253 237   BMET  Basename 05/15/11 0500 05/14/11 0426  NA 141 140  K 2.8* 2.9*  CL 101 99  CO2 33* 32  GLUCOSE 186* 196*  BUN 21 19  CREATININE 0.51 0.52  CALCIUM 8.0* 8.3*   PT/INR No results found for this basename: LABPROT:2,INR:2 in the last 72 hours ABG  Basename 05/14/11 0443 05/13/11 0410  PHART 7.518* 7.475*  HCO3 32.0* 29.6*    Studies/Results: Dg Chest Port 1 View  05/14/2011  *RADIOLOGY REPORT*  Clinical Data: Evaluate ET tube placement  PORTABLE CHEST - 1 VIEW  Comparison: 05/13/2011  Findings: The endotracheal tube tip is above the carina.  There is a left IJ catheter with tip in the SVC.  Stable cardiac enlargement.  Lung volumes are low and there is interstitial edema bilaterally. Pleural effusions are also noted.  IMPRESSION:  1.  Low lung volumes and congestive heart failure. 2.  Stable position of ET tube.  Original Report Authenticated By: Rosealee Albee, M.D.    Anti-infectives: Anti-infectives     Start     Dose/Rate Route Frequency Ordered Stop   05/07/11 1400   imipenem-cilastatin (PRIMAXIN) 500 mg in sodium chloride 0.9 % 100 mL IVPB        500 mg 200 mL/hr over 30 Minutes Intravenous 3 times per day 05/07/11 1106     05/07/11 1200   micafungin (MYCAMINE) 100 mg in sodium chloride 0.9 % 100 mL IVPB     Comments: PER PHARMACY      100 mg 100 mL/hr over 1 Hours Intravenous Every 24 hours 05/07/11 1032     05/07/11 1200   vancomycin (VANCOCIN) 1,250 mg in sodium chloride 0.9 % 250 mL IVPB        1,250 mg 166.7 mL/hr over 90 Minutes Intravenous Every 24 hours 05/07/11 1106     05/07/11 1000   micafungin (MYCAMINE) 150 mg in sodium chloride 0.9 % 100 mL IVPB  Status:  Discontinued     Comments: PER PHARMACY      150 mg 100 mL/hr over 1 Hours Intravenous Daily 05/07/11 0951 05/07/11 1029   05/07/11 0300   ertapenem (INVANZ) 1 g in sodium chloride 0.9 % 50 mL IVPB        1 g 100 mL/hr over 30 Minutes Intravenous  Once 05/07/11 0300 05/07/11 0348   05/07/11 0130   cefTRIAXone (ROCEPHIN) 1 g in dextrose 5 % 50 mL IVPB        1 g 100 mL/hr over 30 Minutes Intravenous  Once 05/07/11 0122 05/07/11 0200  Assessment/Plan: s/p Procedure(s): EXPLORATORY LAPAROTOMY GASTROSTOMY GASTRECTOMY CHOLECYSTECTOMY  Discussed briefly with CCM.  Agree with long term discussion regarding trach vs comfort care. Continue tube feeds.  Advance slowly  LOS: 9 days    Darnita Woodrum A 05/15/2011

## 2011-05-16 ENCOUNTER — Inpatient Hospital Stay (HOSPITAL_COMMUNITY): Payer: Medicare HMO

## 2011-05-16 LAB — BLOOD GAS, ARTERIAL
Acid-Base Excess: 7.2 mmol/L — ABNORMAL HIGH (ref 0.0–2.0)
Bicarbonate: 29.8 mEq/L — ABNORMAL HIGH (ref 20.0–24.0)
FIO2: 0.3 %
O2 Saturation: 96.9 %
PEEP: 5 cmH2O
Patient temperature: 98.6

## 2011-05-16 LAB — BASIC METABOLIC PANEL
BUN: 19 mg/dL (ref 6–23)
CO2: 30 mEq/L (ref 19–32)
Chloride: 106 mEq/L (ref 96–112)
Creatinine, Ser: 0.58 mg/dL (ref 0.50–1.10)

## 2011-05-16 LAB — GLUCOSE, CAPILLARY
Glucose-Capillary: 141 mg/dL — ABNORMAL HIGH (ref 70–99)
Glucose-Capillary: 158 mg/dL — ABNORMAL HIGH (ref 70–99)
Glucose-Capillary: 149 mg/dL — ABNORMAL HIGH (ref 70–99)
Glucose-Capillary: 116 mg/dL — ABNORMAL HIGH (ref 70–99)
Glucose-Capillary: 137 mg/dL — ABNORMAL HIGH (ref 70–99)
Glucose-Capillary: 147 mg/dL — ABNORMAL HIGH (ref 70–99)

## 2011-05-16 LAB — CBC
HCT: 24.5 % — ABNORMAL LOW (ref 36.0–46.0)
MCV: 81.9 fL (ref 78.0–100.0)
RBC: 2.99 MIL/uL — ABNORMAL LOW (ref 3.87–5.11)
WBC: 7.5 10*3/uL (ref 4.0–10.5)

## 2011-05-16 LAB — MAGNESIUM: Magnesium: 1.9 mg/dL (ref 1.5–2.5)

## 2011-05-16 MED ORDER — POTASSIUM CHLORIDE 20 MEQ/15ML (10%) PO LIQD
40.0000 meq | Freq: Four times a day (QID) | ORAL | Status: AC
  Administered 2011-05-16 (×3): 40 meq
  Filled 2011-05-16 (×3): qty 30

## 2011-05-16 MED ORDER — FUROSEMIDE 10 MG/ML IJ SOLN
40.0000 mg | Freq: Four times a day (QID) | INTRAMUSCULAR | Status: AC
  Administered 2011-05-16 (×3): 40 mg via INTRAVENOUS
  Filled 2011-05-16 (×3): qty 4

## 2011-05-16 MED ORDER — MIDAZOLAM HCL 2 MG/2ML IJ SOLN
1.0000 mg | INTRAMUSCULAR | Status: DC | PRN
  Administered 2011-05-16 – 2011-05-17 (×2): 2 mg via INTRAVENOUS
  Filled 2011-05-16 (×3): qty 2

## 2011-05-16 MED ORDER — MIDAZOLAM BOLUS VIA INFUSION
1.0000 mg | INTRAVENOUS | Status: DC | PRN
  Filled 2011-05-16: qty 2

## 2011-05-16 MED ORDER — MAGNESIUM SULFATE BOLUS VIA INFUSION
2.0000 g | Freq: Once | INTRAVENOUS | Status: DC
  Filled 2011-05-16: qty 500

## 2011-05-16 MED ORDER — FENTANYL CITRATE 0.05 MG/ML IJ SOLN
25.0000 ug | INTRAMUSCULAR | Status: DC | PRN
  Administered 2011-05-16 – 2011-05-21 (×14): 50 ug via INTRAVENOUS
  Filled 2011-05-16 (×15): qty 2

## 2011-05-16 MED ORDER — MAGNESIUM SULFATE 40 MG/ML IJ SOLN
2.0000 g | Freq: Once | INTRAMUSCULAR | Status: AC
  Administered 2011-05-16: 2 g via INTRAVENOUS
  Filled 2011-05-16: qty 50

## 2011-05-16 NOTE — Progress Notes (Signed)
  This patient is wide awake, and although she has had tracheostomy before, I am not sure that she needs a tracheostomy now.  From a pulmonary standpint she is weanable and possibly extubateable.  Surgically she is doing fine.  Marta Lamas. Gae Bon, MD, FACS [phone] [facility]

## 2011-05-16 NOTE — Progress Notes (Signed)
HPI:  [name] is a 77 y.o. female transferred from [facility] on 05/06/2011 with headache and abd pain. Found to have intraperitoneal free air, and perforated pylorus complicated by septic shock and VDRF.  PMHx HTN, DM, GERD, Dementia, Depression, OA, Pancreatitis, Angiomyolipoma Rt kidney, Anemia.  Antibiotics:   Vanc 11/8>>>  Primaxin 11/8>>>  Micofungin 11/8>>>   Cultures:   MSSA PCR 11/18 - POSITIVE  MRSA PCR 11/18 - NEGATIVE  BCx2 11/8>>>NTD  Urine 11/8>>> NTD  Lactate 11/8>>> 3  Pct 11/8>>> 130  Cortisol 11/8 >> 58  ...................  PCt 11/11 - 1.4  Lactate 11/11 - 7.24  Access:  Left IJ 11/8>>>  ETT 11/8>>>  Rt fem aline 11/8>>>11/10  Foley 11/7 (at [employer]) >>  Best Practice: DVT: SCD's GI: Protonix ICU hyperglycemia protocol Sepsis protocol completed.  Subjective:   Physical Exam: Filed Vitals:   05/12/11 0600  BP: 129/71  Pulse: 126  Temp: 100.5  Resp: 21    Intake/Output Summary (Last 24 hours) at 05/16/11 0906 Last data filed at 05/16/11 0700  Gross per 24 hour  Intake 3822.57 ml  Output   2735 ml  Net 1087.57 ml   Vent Mode:  [-] CPAP FiO2 (%):  [29.6 %-30.2 %] 30 % Set Rate:  [16 bmp] 16 bmp Vt Set:  [450 mL] 450 mL PEEP:  [5 cmH20] 5 cmH20 Pressure Support:  [8 cmH20-14 cmH20] 14 cmH20 Plateau Pressure:  [17 cmH20-20 cmH20] 17 cmH20  Neuro: Agitated but follows commands. Cardiac: RRR, Nl S1/S2, -M/R/G. Pulmonary: Coarse BS diffusely with end exp wheezes. GI: Soft, NT, ND and +BS. Extremities: 2+ edema and -tenderness.  Labs: CBC    Component Value Date/Time   WBC 7.5 05/16/2011 0500   RBC 2.99* 05/16/2011 0500   HGB 7.8* 05/16/2011 0500   HCT 24.5* 05/16/2011 0500   PLT 277 05/16/2011 0500   MCV 81.9 05/16/2011 0500   MCH 26.1 05/16/2011 0500   MCHC 31.8 05/16/2011 0500   RDW 20.9* 05/16/2011 0500   LYMPHSABS 1.0 05/13/2011 0515   MONOABS 0.4 05/13/2011 0515   EOSABS 0.1 05/13/2011 0515   BASOSABS 0.0 05/13/2011 0515      BMET    Component Value Date/Time   NA 145 05/16/2011 0500   K 3.3* 05/16/2011 0500   CL 106 05/16/2011 0500   CO2 30 05/16/2011 0500   GLUCOSE 148* 05/16/2011 0500   BUN 19 05/16/2011 0500   CREATININE 0.58 05/16/2011 0500   CALCIUM 7.9* 05/16/2011 0500   GFRNONAA 87* 05/16/2011 0500   GFRAA >90 05/16/2011 0500   Mg: 1.7 Phos: 4.4  Chest Xray:  pending  Assessment & Plan:  Sepsis (05/07/2011), Erosive esophagitis and acute abdomen.   Assessment: Due to a perforated pyloric ulcer s/p laparotomy 11/07.   Plan:  Continue abx until abdominal condition is more stable (today is day 10 of abx, would finish 14 days prior to starting to deescalate as patient continues to have low grade fever).  Cultures NTD.  Now with low grade fevers, will not need CT, but will continue to monitor for signs of intraabdominal abcess.  Diurese as tolerated.  Acute respiratory failure (05/07/2011)   Assessment: due to underlying sepsis and septic shock now with a strong fluid overload component and severe deconditioning and chronic illness.  I spoke with the family on 11/16 and they are considering a trach and will let us know on Monday during a family meeting to decide on trach/peg vs extubation with no  intention to re-intubate.   Plan: Continue vent weaning as tolerated, agitation is improved with precedex.  Titrate O2 as tolerated.  ABG noted and adjust vent accordingly.  Diurese.  Will likely need trach next week, will meet with family today.  SBT in AM.  DIABETES MELLITUS, TYPE II (09/13/2007)   Plan:  TF to be restarted through J-tube.  ICU hyperglycemia protocol.  D/C TPN.  HYPERTENSION (09/13/2007)   Assessment: By history.   Plan:  Diurese.  Continue beta blockers.  Anemia (01/26/2011)   Assessment: Chronic disease and inflammation, slow drop and no evidence of active bleed.   Plan:  Monitor, no need for transfusion at this point.  Hyponatremia (01/28/2011)   Assessment: Due to  volume shifts and resolving.   Plan:  Continue to diurese and monitor.  Abdominal pain, acute (05/07/2011)   Assessment: Due perf.   Plan:  Per surgery.  Metabolic acidosis (05/07/2011)   Assessment: Due to sepsis and hyperchloremia.   Plan:  Resolving with diureses and better volume control.   The patient is critically ill with multiple organ systems failure and requires high complexity decision making for assessment and support, frequent evaluation and titration of therapies, application of advanced monitoring technologies and extensive interpretation of multiple databases. Critical Care Time devoted to patient care services described in this note is 40 minutes.    Koren Bound, MD

## 2011-05-16 NOTE — Progress Notes (Signed)
Called Dr wyatt . Reported 850 cc out of G tube. No new orders

## 2011-05-16 NOTE — Progress Notes (Signed)
ANTIBIOTIC CONSULT NOTE - FOLLOW UP  Pharmacy Consult for Vancomycin/Primaxin/Micafungin - Day #9 Indication: s/p bowel perforation - sepsis coverage  Allergies  Allergen Reactions  . Other Swelling    Pinto beans cause swelling and hives  . Penicillins Swelling    Patient Measurements: Height: 5\' 6"  (167.6 cm) Weight: 183 lb 6.8 oz (83.2 kg) IBW/kg (Calculated) : 59.3  Dosing weight: 87 kg   Vital Signs: Temp: 98 F (36.7 C) (11/17 0800) BP: 112/54 mmHg (11/17 1100) Pulse Rate: 77  (11/17 1100) Intake/Output from previous day: 11/16 0701 - 11/17 0700 In: 4196.2 [I.V.:841.2; IV Piggyback:900; TPN:280] Out: 2735 [Urine:700; Drains:2035]  Labs:  [facility] 05/16/11 0500 05/15/11 0500 05/14/11 0426  WBC 7.5 9.0 13.8*  HGB 7.8* 7.6* 7.7*  PLT 277 253 237  LABCREA -- -- --  CREATININE 0.58 0.51 0.52   Estimated Creatinine Clearance: 64.1 ml/min (by C-G formula based on Cr of 0.58).   Assessment: 41 YOF with bowel perforation on broad spectrum abx empirically.  All cultures are negative to date.  Noted plan to continue abx for ~14 days before deescalating.  Renal function has been stable and vanc trough therapeutic.  Goal of Therapy:  Vancomycin trough level 15-20 mcg/ml; Appropriate antibiotic regimens for renal function & infection.  Plan:  1.  Continue vanc 1250mg  IV Q24H. 2.  Continue micafungin and Primaxin at current doses. 3.  Weekly vanc trough to r/o accumulation.    [name] 05/16/2011,3:46 PM

## 2011-05-16 NOTE — Progress Notes (Signed)
9 Days Post-Op  Subjective: Remains on vent.   Gets anxious with attempts to wean further per CCM.  Tube feeds restarted and tolerating currently at 75ml/hr  Objective: Vital signs in last 24 hours: Temp:  [99.8 F (37.7 C)-99.9 F (37.7 C)] 99.8 F (37.7 C) (11/16 1700) Pulse Rate:  [67-123] 68  (11/17 0800) Resp:  [23-43] 30  (11/17 0800) BP: (87-139)/(42-107) 94/53 mmHg (11/17 0800) SpO2:  [88 %-100 %] 98 % (11/17 0800) FiO2 (%):  [29.6 %-30.2 %] 30 % (11/17 0809) Weight:  [83.2 kg (183 lb 6.8 oz)] 183 lb 6.8 oz (83.2 kg) (11/17 0500)    Intake/Output from previous day: 11/16 0701 - 11/17 0700 In: 4096.2 [I.V.:841.2; IV Piggyback:800; TPN:280] Out: 2735 [Urine:700; Drains:2035] Intake/Output this shift:   PE: Pt currently agitated on vent Drains still serosang without obvious bile.  Abdomen otherwise benign, mid line abd wound healing with packing between staples  Lab Results:   Basename 05/16/11 0500 05/15/11 0500  WBC 7.5 9.0  HGB 7.8* 7.6*  HCT 24.5* 23.1*  PLT 277 253   BMET  Basename 05/16/11 0500 05/15/11 0500  NA 145 141  K 3.3* 2.8*  CL 106 101  CO2 30 33*  GLUCOSE 148* 186*  BUN 19 21  CREATININE 0.58 0.51  CALCIUM 7.9* 8.0*   PT/INR No results found for this basename: LABPROT:2,INR:2 in the last 72 hours ABG  Basename 05/16/11 0345 05/14/11 0443  PHART 7.571* 7.518*  HCO3 29.8* 32.0*    Studies/Results: Dg Chest Port 1 View  05/16/2011  *RADIOLOGY REPORT*  Clinical Data: ET tube placement.  PORTABLE CHEST - 1 VIEW  Comparison: Chest 05/14/2011.  Findings: Endotracheal tube is in place with tip 2 cm above the carina.  Left IJ catheter has been removed.  There is a new right PICC with the tip in the lower superior vena cava.  Left worse than right basilar airspace disease persists but appears improved somewhat.  No pneumothorax is identified.  Heart size is normal.  IMPRESSION:  1.  ET tube tip is 2 cm above the carina. 2.  New right PICC in  good position. 3.  Some improvement in bibasilar airspace opacities.  Original Report Authenticated By: Bernadene Bell. Maricela Curet, M.D.    Anti-infectives: Anti-infectives     Start     Dose/Rate Route Frequency Ordered Stop   05/07/11 1400   imipenem-cilastatin (PRIMAXIN) 500 mg in sodium chloride 0.9 % 100 mL IVPB        500 mg 200 mL/hr over 30 Minutes Intravenous 3 times per day 05/07/11 1106     05/07/11 1200   micafungin (MYCAMINE) 100 mg in sodium chloride 0.9 % 100 mL IVPB     Comments: PER PHARMACY      100 mg 100 mL/hr over 1 Hours Intravenous Every 24 hours 05/07/11 1032     05/07/11 1200   vancomycin (VANCOCIN) 1,250 mg in sodium chloride 0.9 % 250 mL IVPB        1,250 mg 166.7 mL/hr over 90 Minutes Intravenous Every 24 hours 05/07/11 1106     05/07/11 1000   micafungin (MYCAMINE) 150 mg in sodium chloride 0.9 % 100 mL IVPB  Status:  Discontinued     Comments: PER PHARMACY      150 mg 100 mL/hr over 1 Hours Intravenous Daily 05/07/11 0951 05/07/11 1029   05/07/11 0300   ertapenem (INVANZ) 1 g in sodium chloride 0.9 % 50 mL IVPB  1 g 100 mL/hr over 30 Minutes Intravenous  Once 05/07/11 0300 05/07/11 0348   05/07/11 0130   cefTRIAXone (ROCEPHIN) 1 g in dextrose 5 % 50 mL IVPB        1 g 100 mL/hr over 30 Minutes Intravenous  Once 05/07/11 0122 05/07/11 0200          Assessment/Plan: s/p Procedure(s): EXPLORATORY LAPAROTOMY GASTROSTOMY GASTRECTOMY CHOLECYSTECTOMY  Plan- Advance tube feeds to 35 ml/hr later today per orders already written. Wound healing slowly and drains functioning. Likely for trach next week per CCM  LOS: 10 days    Javaris Wigington,PA-C Pager [phone] General Trauma Pager [phone]

## 2011-05-17 ENCOUNTER — Inpatient Hospital Stay (HOSPITAL_COMMUNITY): Payer: Medicare HMO

## 2011-05-17 DIAGNOSIS — J9600 Acute respiratory failure, unspecified whether with hypoxia or hypercapnia: Secondary | ICD-10-CM

## 2011-05-17 DIAGNOSIS — A419 Sepsis, unspecified organism: Secondary | ICD-10-CM

## 2011-05-17 DIAGNOSIS — R6521 Severe sepsis with septic shock: Secondary | ICD-10-CM

## 2011-05-17 DIAGNOSIS — K631 Perforation of intestine (nontraumatic): Secondary | ICD-10-CM

## 2011-05-17 LAB — BLOOD GAS, ARTERIAL
Bicarbonate: 30.7 mEq/L — ABNORMAL HIGH (ref 20.0–24.0)
Bicarbonate: 29.7 mEq/L — ABNORMAL HIGH (ref 20.0–24.0)
MECHVT: 450 mL
Pressure support: 5 cmH2O
TCO2: 31.9 mmol/L (ref 0–100)
pCO2 arterial: 37.4 mmHg (ref 35.0–45.0)
pH, Arterial: 7.525 — ABNORMAL HIGH (ref 7.350–7.400)
pH, Arterial: 7.501 — ABNORMAL HIGH (ref 7.350–7.400)
pO2, Arterial: 88.2 mmHg (ref 80.0–100.0)
pO2, Arterial: 103 mmHg — ABNORMAL HIGH (ref 80.0–100.0)

## 2011-05-17 LAB — MAGNESIUM: Magnesium: 1.9 mg/dL (ref 1.5–2.5)

## 2011-05-17 LAB — BASIC METABOLIC PANEL
BUN: 13 mg/dL (ref 6–23)
Creatinine, Ser: 0.56 mg/dL (ref 0.50–1.10)
GFR calc Af Amer: >90 mL/min (ref >90)
GFR calc non Af Amer: 88 mL/min — ABNORMAL LOW (ref >90)
Potassium: 3.3 mEq/L — ABNORMAL LOW (ref 3.5–5.1)

## 2011-05-17 LAB — VANCOMYCIN, TROUGH: Vancomycin Tr: 10.8 ug/mL (ref 10.0–20.0)

## 2011-05-17 LAB — CBC
Hemoglobin: 7.1 g/dL — ABNORMAL LOW (ref 12.0–15.0)
MCHC: 31.7 g/dL (ref 30.0–36.0)
RDW: 21.2 % — ABNORMAL HIGH (ref 11.5–15.5)

## 2011-05-17 LAB — GLUCOSE, CAPILLARY
Glucose-Capillary: 133 mg/dL — ABNORMAL HIGH (ref 70–99)
Glucose-Capillary: 117 mg/dL — ABNORMAL HIGH (ref 70–99)

## 2011-05-17 LAB — PHOSPHORUS: Phosphorus: 4.1 mg/dL (ref 2.3–4.6)

## 2011-05-17 MED ORDER — FUROSEMIDE 10 MG/ML IJ SOLN
40.0000 mg | Freq: Four times a day (QID) | INTRAMUSCULAR | Status: AC
  Administered 2011-05-17 (×3): 40 mg via INTRAVENOUS
  Filled 2011-05-17 (×2): qty 4

## 2011-05-17 MED ORDER — POTASSIUM CHLORIDE 20 MEQ/15ML (10%) PO LIQD
40.0000 meq | Freq: Three times a day (TID) | ORAL | Status: AC
  Administered 2011-05-17 (×2): 40 meq
  Filled 2011-05-17 (×2): qty 30

## 2011-05-17 MED ORDER — FREE WATER
250.0000 mL | Freq: Four times a day (QID) | Status: DC
  Administered 2011-05-17 – 2011-05-19 (×5): 250 mL
  Filled 2011-05-17 (×12): qty 250

## 2011-05-17 NOTE — Progress Notes (Signed)
ANTIBIOTIC CONSULT NOTE - FOLLOW UP  Pharmacy Consult for Vancomycin/Primaxin/Micafungin - Day #9 Indication: s/p bowel perforation - sepsis coverage  Allergies  Allergen Reactions  . Other Swelling    Pinto beans cause swelling and hives  . Penicillins Swelling    Patient Measurements: Height: 5\' 6"  (167.6 cm) Weight: 174 lb 13.2 oz (79.3 kg) IBW/kg (Calculated) : 59.3  Dosing weight: 87 kg   Vital Signs: Temp: 99.1 F (37.3 C) (11/18 1200) Temp src: Oral (11/18 0400) BP: 113/81 mmHg (11/18 0900) Pulse Rate: 83  (11/18 0900) Intake/Output from previous day: 11/17 0701 - 11/18 0700 In: 2365 [I.V.:720; IV Piggyback:700] Out: 4105 [Urine:2370; Drains:1735]  Labs:  Basename 05/17/11 0500 05/16/11 0500 05/15/11 0500  WBC 8.2 7.5 9.0  HGB 7.1* 7.8* 7.6*  PLT 321 277 253  LABCREA -- -- --  CREATININE 0.56 0.58 0.51   Estimated Creatinine Clearance: 62.6 ml/min (by C-G formula based on Cr of 0.56).   Assessment: 29 YOF with bowel perforation on broad spectrum abx empirically.  All cultures are negative to date.  Noted plan to continue abx for ~14 days before deescalating, today is D10 vanc/primaxin/micafungin.  Renal function has been stable and vanc trough therapeutic at 10.8 mcg/ml.  Goal of Therapy:  Vanc trough 10-15 mcg/ml  Plan:  1.  Continue Vanc 1250mg  IV Q24H. 2.  Continue micafungin and Primaxin at current doses. 3.  Weekly vanc trough to r/o accumulation.    Ferron Ishmael K. Allena Katz, PharmD, BCPS.  Clinical Pharmacist Pager [phone]. 05/17/2011 1:27 PM

## 2011-05-17 NOTE — Progress Notes (Signed)
10 Days Post-Op  Subjective: Extubated earlier this am, asking to drink water. Voice is very hoarse and low volume. Tolerating tube feeding. Continues to be anxious, shaking side rails of bed at times.  Objective: Vital signs in last 24 hours: Temp:  [98.3 F (36.8 C)-99.8 F (37.7 C)] 98.4 F (36.9 C) (11/18 0800) Pulse Rate:  [73-134] 83  (11/18 0900) Resp:  [14-32] 28  (11/18 0900) BP: (83-169)/(38-90) 113/81 mmHg (11/18 0900) SpO2:  [82 %-100 %] 100 % (11/18 0900) FiO2 (%):  [29.6 %-30.6 %] 30 % (11/18 0800) Weight:  [79.3 kg (174 lb 13.2 oz)] 174 lb 13.2 oz (79.3 kg) (11/18 0500)    Intake/Output from previous day: 11/17 0701 - 11/18 0700 In: 2330 [I.V.:720; IV Piggyback:700] Out: 4105 [Urine:2370; Drains:1735] Intake/Output this shift: Total I/O In: 40 [I.V.:40] Out: -  PE: Restless and perservating on drinking water. Drains still serosang without obvious bile.  Abdomen otherwise benign, mid line abd wound healing with packing between staples Tolerating tube feeds well per PEG and at 35cc/hr  Lab Results:   Basename 05/17/11 0500 05/16/11 0500  WBC 8.2 7.5  HGB 7.1* 7.8*  HCT 22.4* 24.5*  PLT 321 277   BMET  Basename 05/17/11 0500 05/16/11 0500  NA 151* 145  K 3.3* 3.3*  CL 112 106  CO2 31 30  GLUCOSE 118* 148*  BUN 13 19  CREATININE 0.56 0.58  CALCIUM 7.3* 7.9*   PT/INR No results found for this basename: LABPROT:2,INR:2 in the last 72 hours ABG  Basename 05/17/11 0830 05/17/11 0425  PHART 7.501* 7.525*  HCO3 29.7* 30.7*    Studies/Results: Dg Chest Port 1 View  05/17/2011  *RADIOLOGY REPORT*  Clinical Data: ET tube placement  PORTABLE CHEST - 1 VIEW  Comparison: 05/16/2011  Findings: Endotracheal tube terminates 2.5 cm above the carina.  Low lung volumes with bibasilar atelectasis and small left pleural effusion. No pneumothorax.  IMPRESSION: Endotracheal tube terminates 2.5 cm above the carina.  Low lung volumes with bibasilar atelectasis  and small left pleural effusion.  Original Report Authenticated By: Charline Bills, M.D.   Dg Chest Port 1 View  05/16/2011  *RADIOLOGY REPORT*  Clinical Data: ET tube placement.  PORTABLE CHEST - 1 VIEW  Comparison: Chest 05/14/2011.  Findings: Endotracheal tube is in place with tip 2 cm above the carina.  Left IJ catheter has been removed.  There is a new right PICC with the tip in the lower superior vena cava.  Left worse than right basilar airspace disease persists but appears improved somewhat.  No pneumothorax is identified.  Heart size is normal.  IMPRESSION:  1.  ET tube tip is 2 cm above the carina. 2.  New right PICC in good position. 3.  Some improvement in bibasilar airspace opacities.  Original Report Authenticated By: Bernadene Bell. Maricela Curet, M.D.    Anti-infectives: Anti-infectives     Start     Dose/Rate Route Frequency Ordered Stop   05/07/11 1400   imipenem-cilastatin (PRIMAXIN) 500 mg in sodium chloride 0.9 % 100 mL IVPB        500 mg 200 mL/hr over 30 Minutes Intravenous 3 times per day 05/07/11 1106     05/07/11 1200   micafungin (MYCAMINE) 100 mg in sodium chloride 0.9 % 100 mL IVPB     Comments: PER PHARMACY      100 mg 100 mL/hr over 1 Hours Intravenous Every 24 hours 05/07/11 1032     05/07/11 1200  vancomycin (VANCOCIN) 1,250 mg in sodium chloride 0.9 % 250 mL IVPB        1,250 mg 166.7 mL/hr over 90 Minutes Intravenous Every 24 hours 05/07/11 1106     05/07/11 1000   micafungin (MYCAMINE) 150 mg in sodium chloride 0.9 % 100 mL IVPB  Status:  Discontinued     Comments: PER PHARMACY      150 mg 100 mL/hr over 1 Hours Intravenous Daily 05/07/11 0951 05/07/11 1029   05/07/11 0300   ertapenem (INVANZ) 1 g in sodium chloride 0.9 % 50 mL IVPB        1 g 100 mL/hr over 30 Minutes Intravenous  Once 05/07/11 0300 05/07/11 0348   05/07/11 0130   cefTRIAXone (ROCEPHIN) 1 g in dextrose 5 % 50 mL IVPB        1 g 100 mL/hr over 30 Minutes Intravenous  Once 05/07/11 0122  05/07/11 0200          Assessment/Plan: s/p Procedure(s): EXPLORATORY LAPAROTOMY GASTROSTOMY GASTRECTOMY CHOLECYSTECTOMY  Plan- Advance tube feeds to 35 ml/hr later today per orders already written. Wound healing slowly and drains functioning. Hypernatremic, likely needs free water flushes.   LOS: 11 days    Alphonso Gregson,PA-C Pager [phone] General Trauma Pager [phone]

## 2011-05-17 NOTE — Progress Notes (Signed)
eLink Physician-Brief Progress Note Patient Name: [name] DOB: 16-Aug-1931 MRN: [id number]  Date of Service  05/17/2011   HPI/Events of Note   Need for restraints  eICU Interventions  Call from bedside nurse reporting that patient has begun pulling out lines and attempting to get OOB without assistance.  Need for restraints determined.  Order for bilateral soft wrist and posey belt sent.   Intervention Category Major Interventions: Infection - evaluation and management;Sepsis - evaluation and management  DETERDING,ELIZABETH 05/17/2011, 11:22 PM

## 2011-05-17 NOTE — Progress Notes (Signed)
HPI:  [name] is a 77 y.o. female transferred from [facility] on 05/06/2011 with headache and abd pain. Found to have intraperitoneal free air, and perforated pylorus complicated by septic shock and VDRF.  PMHx HTN, DM, GERD, Dementia, Depression, OA, Pancreatitis, Angiomyolipoma Rt kidney, Anemia.  Antibiotics:   Vanc 11/8>>>  Primaxin 11/8>>>  Micofungin 11/8>>>   Cultures:   MSSA PCR 11/18 - POSITIVE  MRSA PCR 11/18 - NEGATIVE  BCx2 11/8>>>NTD  Urine 11/8>>> NTD  Lactate 11/8>>> 3  Pct 11/8>>> 130  Cortisol 11/8 >> 58  ...................  PCt 11/11 - 1.4  Lactate 11/11 - 7.24  Access:  Left IJ 11/8>>>  ETT 11/8>>> 11/18 Rt fem aline 11/8>>>11/10  Foley 11/7 (at [employer]) >>  Best Practice: DVT: SCD's GI: Protonix ICU hyperglycemia protocol Sepsis protocol completed.  Subjective:  Much more alert and interactive this AM.  Physical Exam: Filed Vitals:   05/12/11 0600  BP: 129/71  Pulse: 126  Temp: 100.5  Resp: 21    Intake/Output Summary (Last 24 hours) at 05/17/11 0948 Last data filed at 05/17/11 0700  Gross per 24 hour  Intake   2115 ml  Output   4080 ml  Net  -1965 ml   Vent Mode:  [-] PRVC FiO2 (%):  [29.6 %-30.6 %] 30 % Set Rate:  [16 bmp] 16 bmp Vt Set:  [450 mL] 450 mL PEEP:  [5 cmH20] 5 cmH20 Pressure Support:  [5 cmH20-16 cmH20] 5 cmH20 Plateau Pressure:  [15 cmH20-19 cmH20] 15 cmH20  Neuro: Follows commands much better this AM. Cardiac: RRR, Nl S1/S2, -M/R/G. Pulmonary: Coarse BS diffusely with end exp wheezes. GI: Soft, NT, ND and +BS. Extremities: 1+ edema and -tenderness.  Labs: CBC    Component Value Date/Time   WBC 8.2 05/17/2011 0500   RBC 2.71* 05/17/2011 0500   HGB 7.1* 05/17/2011 0500   HCT 22.4* 05/17/2011 0500   PLT 321 05/17/2011 0500   MCV 82.7 05/17/2011 0500   MCH 26.2 05/17/2011 0500   MCHC 31.7 05/17/2011 0500   RDW 21.2* 05/17/2011 0500   LYMPHSABS 1.0 05/13/2011 0515   MONOABS 0.4 05/13/2011 0515   EOSABS 0.1  05/13/2011 0515   BASOSABS 0.0 05/13/2011 0515    BMET    Component Value Date/Time   NA 151* 05/17/2011 0500   K 3.3* 05/17/2011 0500   CL 112 05/17/2011 0500   CO2 31 05/17/2011 0500   GLUCOSE 118* 05/17/2011 0500   BUN 13 05/17/2011 0500   CREATININE 0.56 05/17/2011 0500   CALCIUM 7.3* 05/17/2011 0500   GFRNONAA 88* 05/17/2011 0500   GFRAA >90 05/17/2011 0500   Mg: 1.7 Phos: 4.4  Chest Xray: noted  Assessment & Plan:  Sepsis (05/07/2011), Erosive esophagitis and acute abdomen.   Assessment: Due to a perforated pyloric ulcer s/p laparotomy 11/07.   Plan:  Continue abx until abdominal condition is more stable (today is day 11 of abx, would finish 14 days prior to starting to deescalate as patient continues to have low grade fever).  Cultures NTD.  Now with low grade fevers, will not need CT, but will continue to monitor for signs of intraabdominal abcess, if fever increases or WBC rises will consider a CT of the abd/pelvis for ?of abscess.  Diurese as tolerated.  Acute respiratory failure (05/07/2011)   Assessment: due to underlying sepsis and septic shock now with a strong fluid overload component (that is improving with diureses) and severe deconditioning and chronic illness.  I  spoke with the family on 11/16 and they are considering a trach and will let us know on Monday during a family meeting to decide on trach/peg vs extubation with no intention to re-intubate.  Today respiratory status is much improved and patient is actually extubatable.  Will extubate and if patient deteriorates will re-intubate.  Will meet with family in AM regardless for plan of care and would involve patient in decision.   Plan: Extubate patient.  Titrate O2 as tolerated.  IS/flutter valve.  Diurese.  PT/OT.  DIABETES MELLITUS, TYPE II (09/13/2007)   Plan:  TF has restarted through J-tube and seems to be tolerated well.  ICU hyperglycemia protocol.  D/C TPN.  HYPERTENSION (09/13/2007)    Assessment: By history.   Plan:  Diurese.  Continue beta blockers.  Anemia (01/26/2011)   Assessment: Chronic disease and inflammation, slow drop and no evidence of active bleed.   Plan:  Monitor, no need for transfusion at this point.  Hypernatremia (01/28/2011)   Assessment: Due to diureses.   Plan:  Continue to diurese and monitor.  NGT flushes started.  Abdominal pain, acute (05/07/2011)   Assessment: Due perf.   Plan:  Per surgery.  Metabolic acidosis (05/07/2011)   Assessment: Due to sepsis and hyperchloremia, now resolving.   Plan:  Resolving with diureses and better volume control.   The patient is critically ill with multiple organ systems failure and requires high complexity decision making for assessment and support, frequent evaluation and titration of therapies, application of advanced monitoring technologies and extensive interpretation of multiple databases. Critical Care Time devoted to patient care services described in this note is 40 minutes.    Koren Bound, MD

## 2011-05-18 ENCOUNTER — Encounter: Payer: Self-pay | Admitting: Family Medicine

## 2011-05-18 ENCOUNTER — Inpatient Hospital Stay (HOSPITAL_COMMUNITY): Payer: Medicare HMO

## 2011-05-18 LAB — PHOSPHORUS
Phosphorus: 3.9 mg/dL (ref 2.3–4.6)
Phosphorus: 3.9 mg/dL (ref 2.3–4.6)

## 2011-05-18 LAB — BASIC METABOLIC PANEL
Calcium: 8.5 mg/dL (ref 8.4–10.5)
Calcium: 7.6 mg/dL — ABNORMAL LOW (ref 8.4–10.5)
GFR calc Af Amer: >90 mL/min (ref >90)
GFR calc non Af Amer: 88 mL/min — ABNORMAL LOW (ref >90)
GFR calc non Af Amer: >90 mL/min (ref >90)
Glucose, Bld: 109 mg/dL — ABNORMAL HIGH (ref 70–99)
Glucose, Bld: 84 mg/dL (ref 70–99)
Potassium: 2.5 mEq/L — CL (ref 3.5–5.1)
Potassium: 4.5 mEq/L (ref 3.5–5.1)
Sodium: 153 mEq/L — ABNORMAL HIGH (ref 135–145)
Sodium: 141 mEq/L (ref 135–145)

## 2011-05-18 LAB — GLUCOSE, CAPILLARY
Glucose-Capillary: 128 mg/dL — ABNORMAL HIGH (ref 70–99)
Glucose-Capillary: 115 mg/dL — ABNORMAL HIGH (ref 70–99)
Glucose-Capillary: 113 mg/dL — ABNORMAL HIGH (ref 70–99)
Glucose-Capillary: 93 mg/dL (ref 70–99)

## 2011-05-18 LAB — CBC
Hemoglobin: 8.8 g/dL — ABNORMAL LOW (ref 12.0–15.0)
MCH: 26 pg (ref 26.0–34.0)
Platelets: 460 10*3/uL — ABNORMAL HIGH (ref 150–400)
RBC: 3.39 MIL/uL — ABNORMAL LOW (ref 3.87–5.11)
WBC: 10.6 10*3/uL — ABNORMAL HIGH (ref 4.0–10.5)

## 2011-05-18 LAB — MAGNESIUM
Magnesium: 1.7 mg/dL (ref 1.5–2.5)
Magnesium: 2 mg/dL (ref 1.5–2.5)

## 2011-05-18 MED ORDER — SODIUM CHLORIDE 0.45 % IV SOLN
INTRAVENOUS | Status: AC
  Administered 2011-05-18 – 2011-05-19 (×2): via INTRAVENOUS
  Filled 2011-05-18 (×3): qty 1000

## 2011-05-18 MED ORDER — MAGNESIUM SULFATE 50 % IJ SOLN
2.0000 g | Freq: Once | INTRAVENOUS | Status: AC
  Administered 2011-05-18: 2 g via INTRAVENOUS
  Filled 2011-05-18: qty 4

## 2011-05-18 MED ORDER — IOHEXOL 300 MG/ML  SOLN
100.0000 mL | Freq: Once | INTRAMUSCULAR | Status: AC | PRN
  Administered 2011-05-18: 100 mL via INTRAVENOUS

## 2011-05-18 MED ORDER — POTASSIUM CHLORIDE 10 MEQ/50ML IV SOLN
10.0000 meq | INTRAVENOUS | Status: AC
  Administered 2011-05-18 (×6): 10 meq via INTRAVENOUS
  Filled 2011-05-18 (×6): qty 50

## 2011-05-18 NOTE — Progress Notes (Signed)
HPI:  [name] is a 77 y.o. female transferred from [facility] on 05/06/2011 with headache and abd pain. Found to have intraperitoneal free air, and perforated pylorus complicated by septic shock and VDRF.  PMHx HTN, DM, GERD, Dementia, Depression, OA, Pancreatitis, Angiomyolipoma Rt kidney, Anemia.  Antibiotics:   Vanc 11/8>>>  Primaxin 11/8>>>  Micofungin 11/8>>>   Cultures:   MSSA PCR 11/18 - POSITIVE  MRSA PCR 11/18 - NEGATIVE  BCx2 11/8>>>NTD  Urine 11/8>>> NTD  Lactate 11/8>>> 3  Pct 11/8>>> 130  Cortisol 11/8 >> 58  ...................  PCt 11/11 - 1.4  Lactate 11/11 - 7.24  Access:  Left IJ 11/8>>>11/? PICC rt 11/?>>> ETT 11/8>>> 11/18 Rt fem aline 11/8>>>11/10  Foley 11/7 (at [employer]) >>  Best Practice: DVT: SCD's GI: Protonix ICU hyperglycemia protocol Sepsis protocol completed.  Subjective:  Much more alert and interactive this AM. But output concerning from drains per [facility]  Physical Exam: Filed Vitals:   05/18/11 0839  BP:   Pulse:   Temp: 99 F (37.2 C)  Resp:        Intake/Output Summary (Last 24 hours) at 05/18/11 0956 Last data filed at 05/18/11 0800  Gross per 24 hour  Intake   1192 ml  Output   5890 ml  Net  -4698 ml      Neuro: Follows commands, no distress Cardiac: RRR, Nl S1/S2, -M/R/G. Pulmonary: Coarse BS GI: Soft, NT, ND and +BS. Extremities: 1+ edema and -tenderness.  Labs: CBC    Component Value Date/Time   WBC 10.6* 05/18/2011 0645   RBC 3.39* 05/18/2011 0645   HGB 8.8* 05/18/2011 0645   HCT 28.1* 05/18/2011 0645   PLT 460* 05/18/2011 0645   MCV 82.9 05/18/2011 0645   MCH 26.0 05/18/2011 0645   MCHC 31.3 05/18/2011 0645   RDW 21.4* 05/18/2011 0645   LYMPHSABS 1.0 05/13/2011 0515   MONOABS 0.4 05/13/2011 0515   EOSABS 0.1 05/13/2011 0515   BASOSABS 0.0 05/13/2011 0515    BMET    Component Value Date/Time   NA 153* 05/18/2011 0645   K 2.5* 05/18/2011 0645   CL 109 05/18/2011 0645   CO2 30 05/18/2011 0645   GLUCOSE 109* 05/18/2011 0645   BUN 12 05/18/2011 0645   CREATININE 0.56 05/18/2011 0645   CALCIUM 8.5 05/18/2011 0645   GFRNONAA 88* 05/18/2011 0645   GFRAA >90 05/18/2011 0645   Mg: 1.7 Phos: 4.4  Chest Xray: reviewed 18th film, atx, ett   Assessment & Plan:  Sepsis (05/07/2011), Erosive esophagitis and acute abdomen.   Assessment: Due to a perforated pyloric ulcer s/p laparotomy 11/07. Increased output J tube, concern leak / abscess etc   Plan:  For CT abdo/pelvis to consider , per ccs Continue aggressive abdo coverage Abx Npo Check location drains Npo lft in am   Acute respiratory failure (05/07/2011)  Plan: Appears well, add IS, low O2 needs If needs re look etc,, will intubate in room prior No distress  DIABETES MELLITUS, TYPE II (09/13/2007)   Plan:dc lantus as NPO  HYPERTENSION (09/13/2007)   Assessment: By history.   Plan: hypernatremia worsening, with neg balance, dc lasix, keep up with losses, see below  Anemia (01/26/2011)   Assessment: Chronic disease and inflammation, slow drop and no evidence of active bleed.   Plan:  Monitor, no need for transfusion at this point.  Hypernatremia (01/28/2011) hypokalemia   Assessment: Due to diureses.   Plan:  Dc lasix, dc NaCl , add 1/2  NS with Kckl bmet in pm and am  1/2 NS  Abdominal pain, acute (05/07/2011)   Assessment: Due perf.   Plan: consider repeat CT  Metabolic acidosis (05/07/2011)   Assessment: Due to sepsis and hyperchloremia, now resolving.   Plan:  Chem in am and pm   Remain in ICU  Nelda Bucks., MD

## 2011-05-18 NOTE — Progress Notes (Signed)
CSW completed psychosocial assessment, please see shadow chart for details.  CSW to continue to follow.  Angelia Mould, MSW, [place] [phone]

## 2011-05-18 NOTE — Progress Notes (Signed)
REVIEWED.  

## 2011-05-18 NOTE — Progress Notes (Signed)
Physical Therapy Evaluation [name] MRN: [id number] DOB: [birth year] Today's Date: 05/18/2011  Problem List:  Patient Active Problem List  Diagnoses  . DIABETES MELLITUS, TYPE II  . HYPERLIPIDEMIA  . Obesity, unspecified  . DEPRESSION  . HYPERTENSION  . SINUSITIS, ACUTE  . ALLERGIC RHINITIS, SEASONAL  . OTHER CHRONIC BRONCHITIS  . DIVERTICULOSIS OF COLON  . ACUTE CYSTITIS  . INSOMNIA  . FATIGUE  . UNSTEADY GAIT  . HEADACHE  . Other urinary incontinence  . CLOSED FRACTURE OF OLECRANON PROCESS OF ULNA  . HEMATOMA  . HEADACHES, HX OF  . Anorexia  . Anemia  . Syncope and collapse  . Hepatic steatosis  . Hyponatremia  . Gastric mass  . H pylori ulcer  . Ulcer, colon  . Acute renal failure  . S/P exploratory laparotomy  . Pericardial effusion  . Fever, postprocedural  . Confusion  . Leucocytosis  . Sepsis  . Abdominal pain, acute  . Metabolic acidosis  . Esophagitis, erosive  . Acute respiratory failure    Past Medical History:  Past Medical History  Diagnosis Date  . Hypertension   . Reflux   . Right elbow pain     OTIF  . Dementia   . Depression   . Osteoarthritis   . Pancreatitis 11/2007    [facility]  . Angiomyolipoma of kidney     right  . Ulcerative esophagitis 12/05/2007    hx elevated gastrin, severe on EGD by Dr Jena Gauss , h pylori negative  . Hiatal hernia   . S/P colonoscopy 2009    pt reports normal by Dr Lovell Sheehan  . Diabetes mellitus   . Anemia   . Hyponatremia    Past Surgical History:  Past Surgical History  Procedure Date  . Orif right hip 1999    [facility]  . Umbilical hernia repair 44 months old    [place]  . Esophagogastroduodenoscopy 01/28/2011    Procedure: ESOPHAGOGASTRODUODENOSCOPY (EGD);  Surgeon: Arlyce Harman, MD;  Location: [facility];  Service: Endoscopy;  Laterality: N/A;  . Colonoscopy 01/28/2011    Procedure: COLONOSCOPY;  Surgeon: Arlyce Harman, MD;  Location: [facility];  Service: Endoscopy;   Laterality: N/A;  . Laparotomy 02/04/2011    Procedure: EXPLORATORY LAPAROTOMY;  Surgeon: Fabio Bering;  Location: [facility] ORS;  Service: General;  Laterality: N/A;    PT Assessment/Plan/Recommendation PT Assessment Clinical Impression Statement: Patient presents with intraperitoneal free air, and perforated pylorus complicated by septic shock and VDRF. She had limited functional activity tolerance today and will benefit from PT to maximize functional independence to enable return home in the future PT Recommendation/Assessment: Patient will need skilled PT in the acute care venue PT Problem List: Decreased strength;Decreased activity tolerance;Decreased mobility PT Therapy Diagnosis : Generalized weakness PT Plan PT Frequency: Min 3X/week PT Treatment/Interventions: Gait training;DME instruction;Functional mobility training;Therapeutic exercise;Patient/family education PT Recommendation Follow Up Recommendations: Skilled nursing facility Equipment Recommended: Defer to next venue PT Goals  Acute Rehab PT Goals PT Goal Formulation: With patient Time For Goal Achievement: 2 weeks Pt will go Supine/Side to Sit: with min assist PT Goal: Supine/Side to Sit - Progress: Other (comment) Pt will go Sit to Supine/Side: with min assist PT Goal: Sit to Supine/Side - Progress: Other (comment) Pt will Transfer Sit to Stand/Stand to Sit: with min assist PT Transfer Goal: Sit to Stand/Stand to Sit - Progress: Other (comment) Pt will Transfer Bed to Chair/Chair to Bed: with min assist PT Transfer Goal:  Bed to Chair/Chair to Bed - Progress: Other (comment) Pt will Ambulate: 16 - 50 feet;with min assist;with least restrictive assistive device PT Goal: Ambulate - Progress: Other (comment)  PT Evaluation Precautions/Restrictions  Precautions Precautions: Fall Precaution Comments: Posey, Safety mitts Restrictions Weight Bearing Restrictions: Yes Prior Functioning  Home Living Lives With:  Alone Receives Help From: Family;Personal care attendant (Aide 5 x a week for 2 hours) Type of Home: Apartment Home Layout: One level Home Access: Level entry Home Adaptive Equipment: Walker - rolling;Straight cane Prior Function Level of Independence: Independent with gait;Needs assistance with ADLs;Needs assistance with homemaking Driving: No Cognition Cognition Arousal/Alertness: Awake/alert Overall Cognitive Status: Appears within functional limits for tasks assessed Orientation Level: Oriented X4 Sensation/Coordination Sensation Light Touch: Appears Intact Extremity Assessment   AAROM WFL bilateral lower extremities. Strength grossly 3-4/5 Mobility (including Balance)   Patient initially agreeable to OOB. Then post exercises patient with nausea and stated not ready for OOB. Bed placed in chair position and mobility will be deferred to next visit   Exercise  General Exercises - Lower Extremity Ankle Circles/Pumps: AROM;Both;10 reps;Seated Long Arc Quad: Seated;10 reps;Both;AROM Hip ABduction/ADduction: AROM;Both;10 reps;Seated (with assist to elevate leg) Hip Flexion/Marching: AROM;Both;10 reps;Seated End of Session PT - End of Session Activity Tolerance:  (Limited by nausea) General Behavior During Session: [facility] for tasks performed Cognition: [facility] for tasks performed  Edwyna Perfect, PT  Pager [phone]  05/18/2011, 9:06 AM

## 2011-05-18 NOTE — Progress Notes (Signed)
11 Days Post-Op  Subjective: Pt alert and calm. Denies CP, SOB. Some abd pain. No N/V TF stopped again due to residuals and now leakage of bile from J-tube site. Approx 600cc dark bilious output from G-tube.  Objective: Vital signs in last 24 hours: Temp:  [98.4 F (36.9 C)-99.5 F (37.5 C)] 99 F (37.2 C) (11/19 0839) Pulse Rate:  [81-134] 119  (11/19 0800) Resp:  [20-34] 25  (11/19 0800) BP: (52-185)/(38-124) 185/85 mmHg (11/19 0800) SpO2:  [98 %-100 %] 100 % (11/19 0800)    Intake/Output this shift: Total I/O In: 20 [I.V.:20] Out: 700 [Urine:700] JP # 2 now with cloudy dark brown fluid output, this is new per RN. JP #1 with thin serous output.  Physical Exam: Abdomen: Soft, no peritonitis. Wound clean, no drainage. Staples removed to allow ease of dressing change. G-tube intact-dark bilious output. J-tube intact- evidence of fair amount of dark bilious drainage around tube at skin. No erythema. Drains intact-see comment in I/O for details.  Labs: CBC  Basename 05/18/11 0645 05/17/11 0500  WBC 10.6* 8.2  HGB 8.8* 7.1*  HCT 28.1* 22.4*  PLT 460* 321   BMET  Basename 05/18/11 0645 05/17/11 0500  NA 153* 151*  K 2.5* 3.3*  CL 109 112  CO2 30 31  GLUCOSE 109* 118*  BUN 12 13  CREATININE 0.56 0.56  CALCIUM 8.5 7.3*   LFT No results found for this basename: PROT,ALBUMIN,AST,ALT,ALKPHOS,BILITOT,BILIDIR,IBILI,LIPASE in the last 72 hours PT/INR No results found for this basename: LABPROT:2,INR:2 in the last 72 hours ABG  Basename 05/17/11 0830 05/17/11 0425  PHART 7.501* 7.525*  HCO3 29.7* 30.7*    Studies/Results: Dg Chest Port 1 View  05/17/2011  *RADIOLOGY REPORT*  Clinical Data: ET tube placement  PORTABLE CHEST - 1 VIEW  Comparison: 05/16/2011  Findings: Endotracheal tube terminates 2.5 cm above the carina.  Low lung volumes with bibasilar atelectasis and small left pleural effusion. No pneumothorax.  IMPRESSION: Endotracheal tube terminates 2.5 cm above  the carina.  Low lung volumes with bibasilar atelectasis and small left pleural effusion.  Original Report Authenticated By: Charline Bills, M.D.   Dg Abd Portable 1v  05/17/2011  *RADIOLOGY REPORT*  Clinical Data: Abdominal distention  ABDOMEN - 1 VIEW  Comparison: 05/12/2011  Findings: Drains are again noted over the abdomen.  Minimal retained contrast noted in nondilated cecum.  Clips overlie the left of midline. Presence or absence of air fluid levels or free air cannot be assessed on this single supine view.  No gas filled dilated loop of bowel is identified.  There is minimal possible free air beneath the left hemidiaphragm as seen on prior exams. Right total hip arthroplasty again noted.  IMPRESSION: No supine evidence for gaseous bowel distention.  Original Report Authenticated By: Harrel Lemon, M.D.    Assessment: Principal Problem:  *Sepsis Active Problems:  DIABETES MELLITUS, TYPE II  HYPERTENSION  Anemia  Hyponatremia  Abdominal pain, acute  Metabolic acidosis  Esophagitis, erosive  Acute respiratory failure  s/p Procedure(s): EXPLORATORY LAPAROTOMY GASTROSTOMY GASTRECTOMY CHOLECYSTECTOMY Plan: Keep TF held. Very concerning change in JP output and dysfunction of feeding tubes. Will d/w MD, ? CT to assess for abscess and J-tube drain study to assess as distal J-tube feeds should not reflux to stomach.   LOS: 12 days    Marianna Fuss 05/18/2011

## 2011-05-18 NOTE — Progress Notes (Signed)
Pt seen and agree.  Pt needs CT abdomen to further evaluate.  May need to hold feeds until studies complete.

## 2011-05-18 NOTE — Progress Notes (Signed)
Patients RN informed me that the patient pulled out her Jejunostomy Tube and the MD is being notified. After entering the patients room, I noticed the patients Jejunostomy Tube laying on the bed. The Jejunostomy Tube site was bleeding. A 16 fr foley catheter was inserted into the site where Jejunostomy Tube was, balloon inflated, and site dress. MD made aware of all intervention initiated. MD plans to follow up after scheduled Abdominal CT is completed. Jaedan Huttner, [employer]

## 2011-05-19 ENCOUNTER — Inpatient Hospital Stay (HOSPITAL_COMMUNITY): Payer: Medicare HMO

## 2011-05-19 DIAGNOSIS — K631 Perforation of intestine (nontraumatic): Secondary | ICD-10-CM

## 2011-05-19 DIAGNOSIS — J9600 Acute respiratory failure, unspecified whether with hypoxia or hypercapnia: Secondary | ICD-10-CM

## 2011-05-19 DIAGNOSIS — R6521 Severe sepsis with septic shock: Secondary | ICD-10-CM

## 2011-05-19 DIAGNOSIS — A419 Sepsis, unspecified organism: Secondary | ICD-10-CM

## 2011-05-19 LAB — CBC
HCT: 29.3 % — ABNORMAL LOW (ref 36.0–46.0)
MCV: 82.5 fL (ref 78.0–100.0)
Platelets: 401 10*3/uL — ABNORMAL HIGH (ref 150–400)
RBC: 3.55 MIL/uL — ABNORMAL LOW (ref 3.87–5.11)
RDW: 20.9 % — ABNORMAL HIGH (ref 11.5–15.5)
WBC: 10.1 10*3/uL (ref 4.0–10.5)

## 2011-05-19 LAB — COMPREHENSIVE METABOLIC PANEL
Albumin: 1.8 g/dL — ABNORMAL LOW (ref 3.5–5.2)
Alkaline Phosphatase: 105 U/L (ref 39–117)
BUN: 11 mg/dL (ref 6–23)
Calcium: 8.4 mg/dL (ref 8.4–10.5)
Creatinine, Ser: 0.56 mg/dL (ref 0.50–1.10)
GFR calc Af Amer: >90 mL/min (ref >90)
Glucose, Bld: 126 mg/dL — ABNORMAL HIGH (ref 70–99)
Potassium: 3.2 mEq/L — ABNORMAL LOW (ref 3.5–5.1)
Total Protein: 6.1 g/dL (ref 6.0–8.3)

## 2011-05-19 LAB — DIFFERENTIAL
Basophils Absolute: 0.1 10*3/uL (ref 0.0–0.1)
Basophils Relative: 1 % (ref 0–1)
Eosinophils Absolute: 0.1 10*3/uL (ref 0.0–0.7)
Eosinophils Relative: 1 % (ref 0–5)
Lymphocytes Relative: 12 % (ref 12–46)
Lymphs Abs: 1.2 10*3/uL (ref 0.7–4.0)
Monocytes Absolute: 0.5 10*3/uL (ref 0.1–1.0)
Monocytes Relative: 5 % (ref 3–12)
Neutro Abs: 8.2 10*3/uL — ABNORMAL HIGH (ref 1.7–7.7)
Neutrophils Relative %: 82 % — ABNORMAL HIGH (ref 43–77)

## 2011-05-19 LAB — GLUCOSE, CAPILLARY
Glucose-Capillary: 115 mg/dL — ABNORMAL HIGH (ref 70–99)
Glucose-Capillary: 125 mg/dL — ABNORMAL HIGH (ref 70–99)

## 2011-05-19 LAB — PHOSPHORUS: Phosphorus: 3.9 mg/dL (ref 2.3–4.6)

## 2011-05-19 LAB — MAGNESIUM: Magnesium: 2 mg/dL (ref 1.5–2.5)

## 2011-05-19 MED ORDER — SODIUM CHLORIDE 0.9 % IV SOLN
INTRAVENOUS | Status: DC
  Filled 2011-05-19: qty 1

## 2011-05-19 MED ORDER — POTASSIUM CHLORIDE 20 MEQ/15ML (10%) PO LIQD
40.0000 meq | Freq: Once | ORAL | Status: AC
  Administered 2011-05-19: 40 meq
  Filled 2011-05-19: qty 30

## 2011-05-19 MED ORDER — POTASSIUM CHLORIDE IN NACL 20-0.45 MEQ/L-% IV SOLN
INTRAVENOUS | Status: DC
  Filled 2011-05-19: qty 1000

## 2011-05-19 MED ORDER — FAT EMULSION 20 % IV EMUL
250.0000 mL | INTRAVENOUS | Status: AC
  Administered 2011-05-19: 250 mL via INTRAVENOUS
  Filled 2011-05-19: qty 250

## 2011-05-19 MED ORDER — POTASSIUM CHLORIDE IN NACL 20-0.45 MEQ/L-% IV SOLN
INTRAVENOUS | Status: AC
  Administered 2011-05-19: 23:00:00 via INTRAVENOUS
  Filled 2011-05-19 (×2): qty 1000

## 2011-05-19 MED ORDER — POTASSIUM CHLORIDE 10 MEQ/50ML IV SOLN
10.0000 meq | INTRAVENOUS | Status: AC
  Administered 2011-05-19 (×4): 10 meq via INTRAVENOUS
  Filled 2011-05-19 (×4): qty 50

## 2011-05-19 MED ORDER — CLINIMIX E/DEXTROSE (5/15) 5 % IV SOLN
INTRAVENOUS | Status: AC
  Administered 2011-05-19: 18:00:00 via INTRAVENOUS
  Filled 2011-05-19: qty 1000

## 2011-05-19 NOTE — Progress Notes (Signed)
The patient was seen, examined and PA note reviewed and data reviewed.  I agree with the plan of action. 

## 2011-05-19 NOTE — Progress Notes (Signed)
HPI:  [name] is a 77 y.o. female transferred from [facility] on 05/06/2011 with headache and abd pain. Found to have intraperitoneal free air, and perforated pylorus complicated by septic shock and VDRF.  PMHx HTN, DM, GERD, Dementia, Depression, OA, Pancreatitis, Angiomyolipoma Rt kidney, Anemia.  Antibiotics:   Vanc 11/8>>> 11/20 Primaxin 11/8>>> plan stop 11/22 Micofungin 11/8>>> 11/20  Cultures:   MSSA PCR 11/18 - POSITIVE  MRSA PCR 11/18 - NEGATIVE  BCx2 11/8>>>NTD  Urine 11/8>>> NTD  Lactate 11/8>>> 3  Pct 11/8>>> 130  Cortisol 11/8 >> 58  ...................  PCt 11/11 - 1.4  Lactate 11/11 - 7.24  Access:  Left IJ 11/8>>>11/? PICC rt 11/?>>> ETT 11/8>>> 11/18 Rt fem aline 11/8>>>11/10  Foley 11/7 (at [employer]) >>  Best Practice: DVT: SCD's GI: Protonix ICU hyperglycemia protocol Sepsis protocol completed.  Studies: CT abdo /pelvis 11/19>>>No discrete peripherally enhancing fluid collections within the<BR>upper abdomen to suggest abscess. There is a moderate amount of<BR>fluid within the upper abdomen which is nonspecific in the early<BR>postoperative period.<BR>2. Postop changes consistent with Billroth II gastric surgery.<BR>There is nonspecific increased caliber of the proximal small bowel<BR>which may represent postoperative ileus.<BR>3. Cholecystectomy.<BR>  Subjective: CT done, no abscess, no fevers, hemodynamically WNL  Physical Exam: Filed Vitals:   05/19/11 1000  BP: 157/83  Pulse: 112  Temp:   Resp: 18       Intake/Output Summary (Last 24 hours) at 05/19/11 1055 Last data filed at 05/19/11 1000  Gross per 24 hour  Intake   2075 ml  Output   1805 ml  Net    270 ml      Neuro: Follows commands, no distress Cardiac: RRR, Nl S1/S2, -M/R/G. Pulmonary:CTA GI: Soft, NT, ND and +BS. Extremities: 1+ edema and -tenderness.  Labs: CBC    Component Value Date/Time   WBC 10.1 05/19/2011 0510   RBC 3.55* 05/19/2011 0510   HGB 9.2* 05/19/2011  0510   HCT 29.3* 05/19/2011 0510   PLT 401* 05/19/2011 0510   MCV 82.5 05/19/2011 0510   MCH 25.9* 05/19/2011 0510   MCHC 31.4 05/19/2011 0510   RDW 20.9* 05/19/2011 0510   LYMPHSABS 1.2 05/19/2011 0510   MONOABS 0.5 05/19/2011 0510   EOSABS 0.1 05/19/2011 0510   BASOSABS 0.1 05/19/2011 0510    BMET    Component Value Date/Time   NA 143 05/19/2011 0510   K 3.2* 05/19/2011 0510   CL 105 05/19/2011 0510   CO2 27 05/19/2011 0510   GLUCOSE 126* 05/19/2011 0510   BUN 11 05/19/2011 0510   CREATININE 0.56 05/19/2011 0510   CALCIUM 8.4 05/19/2011 0510   GFRNONAA 88* 05/19/2011 0510   GFRAA >90 05/19/2011 0510   Mg: 1.7 Phos: 4.4  Chest Xray: mild atx CT reviewed 11/20  Assessment & Plan:  Sepsis (05/07/2011), Erosive esophagitis and acute abdomen.   Assessment: Due to a perforated pyloric ulcer s/p laparotomy 11/07. Increased output J tube 11/19   Plan:  CT reviewed, no asbcess, clinicaly appears well Per ccs tpn started ppi lft in am   Acute respiratory failure (05/07/2011)  Plan: Appears well, add IS, low O2 needs pcxr noted, IS main Tx  DIABETES MELLITUS, TYPE II (09/13/2007)   Plan:dc lantus as NPO  HYPERTENSION (09/13/2007)   Assessment: By history.   Plan: hypernatremia  Improved Continue 1/2 NS  Anemia (01/26/2011)   Assessment: Chronic disease and inflammation, slow drop and no evidence of active bleed.   Plan:  Monitor, no need for transfusion  at this point.  Hypernatremia (01/28/2011) hypokalemia   Assessment: Due to diureses.   Plan:  1/2 NS with Kckl bmet in pm and am  1/2 NS maintain I/O not accurate last 24 hrs Maintain even balance  Hypokalemia k in bag k supp IV x 4  Abdominal pain, acute (05/07/2011)   Assessment: Due perf.   Plan: consider tpn   perotinits / asp PNA Improved Add stop date to Imipenem, 14 days unless clinicall changes Dc vanc, myco  Move to sdu  Nelda Bucks., MD

## 2011-05-19 NOTE — Progress Notes (Signed)
Clinical Social Worker met with pt at bedside and provided emotional support.  CSW reviewed current plan; for pt to transfer to a SNF once medically stable.  Pt confirmed and stated she was interested in [facility] since her step-dtr resides there, CSW requested permission to communicate with pt's step-dtr; pt consented.   CSW relayed this information to stepdtr who clarified that pt and stepdtr reside in [place] (pt's chart confirms this address).  Stepdtr shared that pt was recently discharged from [facility] and suggested she might be interested in returning.  CSW agreed to send pt's information to additional SNFs in the [place] area.  CSW will discuss bed options with pt and step-dtr.   Angelia Mould, MSW, [place] [phone]

## 2011-05-19 NOTE — Progress Notes (Signed)
eLink Physician-Brief Progress Note Patient Name: [name] DOB: April 19, 1932 MRN: [id number]  Date of Service  05/19/2011   HPI/Events of Note   hypokalemia  eICU Interventions  Potassium replaced    DETERDING,ELIZABETH 05/19/2011, 6:48 AM

## 2011-05-19 NOTE — Progress Notes (Signed)
PARENTERAL NUTRITION CONSULT NOTE - INITIAL  Pharmacy Consult for TPN Indication: Not tolerating TF  Allergies  Allergen Reactions  . Other Swelling    Pinto beans cause swelling and hives  . Penicillins Swelling    Patient Measurements: Height: 5\' 6"  (167.6 cm) Weight: 165 lb 9.1 oz (75.1 kg) IBW/kg (Calculated) : 59.3  Adjusted Body Weight:   Usual Weight:    Vital Signs: Temp: 98.6 F (37 C) (11/20 0900) Temp src: Oral (11/20 0900) BP: 148/84 mmHg (11/20 1200) Pulse Rate: 110  (11/20 1200) Intake/Output from previous day: 11/19 0701 - 11/20 0700 In: 1920 [I.V.:920; IV Piggyback:300] Out: 2265 [Urine:2230; Drains:35] Intake/Output from this shift: Total I/O In: 440 [I.V.:375; IV Piggyback:65] Out: 625 [Urine:425; Drains:200]  Labs:  [facility] 05/19/11 0510 05/18/11 0645 05/17/11 0500  WBC 10.1 10.6* 8.2  HGB 9.2* 8.8* 7.1*  HCT 29.3* 28.1* 22.4*  PLT 401* 460* 321  APTT -- -- --  INR -- -- --     Basename 05/19/11 0510 05/18/11 2000 05/18/11 0645  NA 143 141 153*  K 3.2* 4.5 2.5*  CL 105 106 109  CO2 27 27 30   GLUCOSE 126* 84 109*  BUN 11 9 12   CREATININE 0.56 0.45* 0.56  LABCREA -- -- --  CREAT24HRUR -- -- --  CALCIUM 8.4 7.6* 8.5  MG 2.0 2.0 1.7  PHOS 3.9 3.9 3.9  PROT 6.1 -- --  ALBUMIN 1.8* -- --  AST 17 -- --  ALT 11 -- --  ALKPHOS 105 -- --  BILITOT 0.4 -- --  BILIDIR -- -- --  IBILI -- -- --  PREALBUMIN -- -- --  CHOLHDL -- -- --  CHOL -- -- --   Estimated Creatinine Clearance: 61 ml/min (by C-G formula based on Cr of 0.56).    Basename 05/19/11 1151 05/19/11 0847 05/19/11 0503  GLUCAP 103* 115* 128*    Medical History: Past Medical History  Diagnosis Date  . Hypertension   . Reflux   . Right elbow pain     OTIF  . Dementia   . Depression   . Osteoarthritis   . Pancreatitis 11/2007    HOP  . Angiomyolipoma of kidney     right  . Ulcerative esophagitis 12/05/2007    hx elevated gastrin, severe on EGD by Dr Jena Gauss , h  pylori negative  . Hiatal hernia   . S/P colonoscopy 2009    pt reports normal by Dr Lovell Sheehan  . Diabetes mellitus   . Anemia   . Hyponatremia     Medications:  Scheduled:    . feeding supplement  30 mL Per Tube QID  . feeding supplement (VITAL 1.5 CAL)  1,000 mL Per Tube Q24H  . free water  250 mL Per Tube Q6H  . imipenem-cilastatin  500 mg Intravenous Q8H  . insulin aspart  0-4 Units Subcutaneous Q4H  . lip balm  1 application Topical BID  . magnesium sulfate infusion  2 g Intravenous Once  . pantoprazole (PROTONIX) IV  40 mg Intravenous Q12H  . potassium chloride  10 mEq Intravenous Q1 Hr x 6  . potassium chloride  10 mEq Intravenous Q1 Hr x 4  . potassium chloride  40 mEq Per Tube Once  . DISCONTD: antiseptic oral rinse  15 mL Mouth Rinse q12n4p  . DISCONTD: chlorhexidine  15 mL Mouth Rinse BID  . DISCONTD: micafungin (MYCAMINE) IV  100 mg Intravenous Q24H  . DISCONTD: vancomycin  1,250 mg Intravenous Q24H  Insulin Requirements in the past 24 hours:  SSI. Lantus d/c  Current Nutrition:  Vital TF and Prostat unable to be administered due to intoleration and Jtube displacement  Assessment: 77 y/o previously on TPN  (For erosive esophagitis with perforated pyloric ulcer s/p lap 11/7 with gastrostomy, gastrectomy and cholecystectomy) stared 05/12/11. Now to resume TPN for increased J-tube output and and J-tube displacement.  Lytes: Hypokalemia with Kruns x 4 per MD ordered Glucose: CBGs 93-138 on SSI but previously previously required insulin in TPN.   Nutritional Goals:  [phone] kCal, >115 grams of protein per day  Plan:  Resume TPN at 8ml/hr today. Will not add insulin yet. Goal 75 ml/hr = 1433 kcal and 84g protein Decrease IVF by 55ml/hr Assess need for increased insulin overnight.  Merilynn Finland, [employer] 05/19/2011,12:35 PM

## 2011-05-19 NOTE — Consult Note (Signed)
WOC consult Note Reason for Consult: Consult requested for  J tube site Wound type: Full thickness wound from previous J tube, which has just been D/Ced by bedside nurse. Measurement: .5X.5 cm opening with large dark green bile draining. Skin surrounding site reddened from previous leakage around tube. Plan: Applied urostomy pouch to control drainage from site.  Supplies ordered to bedside for staff to apply. Will not plan to follow further unless re-consulted.  [address], RN, MSN, [employer]  [phone]

## 2011-05-19 NOTE — Progress Notes (Signed)
ANTIBIOTIC CONSULT NOTE - FOLLOW UP  Pharmacy Consult for Primaxin Indication: Bowel Perforation  Allergies  Allergen Reactions  . Other Swelling    Pinto beans cause swelling and hives  . Penicillins Swelling    Patient Measurements: Height: 5\' 6"  (167.6 cm) Weight: 165 lb 9.1 oz (75.1 kg) IBW/kg (Calculated) : 59.3    Vital Signs: Temp: 98.6 F (37 C) (11/20 0900) Temp src: Oral (11/20 0900) BP: 148/84 mmHg (11/20 1200) Pulse Rate: 110  (11/20 1200) Intake/Output from previous day: 11/19 0701 - 11/20 0700 In: 1920 [I.V.:920; IV Piggyback:300] Out: 2265 [Urine:2230; Drains:35] Intake/Output from this shift: Total I/O In: 440 [I.V.:375; IV Piggyback:65] Out: 625 [Urine:425; Drains:200]  Labs:  [facility] 05/19/11 0510 05/18/11 2000 05/18/11 0645 05/17/11 0500  WBC 10.1 -- 10.6* 8.2  HGB 9.2* -- 8.8* 7.1*  PLT 401* -- 460* 321  LABCREA -- -- -- --  CREATININE 0.56 0.45* 0.56 --   Estimated Creatinine Clearance: 61 ml/min (by C-G formula based on Cr of 0.56).  Basename 05/17/11 1100  VANCOTROUGH 10.8  VANCOPEAK --  Drue Dun --  GENTTROUGH --  GENTPEAK --  GENTRANDOM --  TOBRATROUGH --  TOBRAPEAK --  TOBRARND --  AMIKACINPEAK --  AMIKACINTROU --  AMIKACIN --     Microbiology: Recent Results (from the past 720 hour(s))  SURGICAL PCR SCREEN     Status: Abnormal   Collection Time   05/07/11  9:30 AM      Component Value Range Status Comment   MRSA, PCR NEGATIVE  NEGATIVE  Final    Staphylococcus aureus POSITIVE (*) NEGATIVE  Final   URINE CULTURE     Status: Normal   Collection Time   05/07/11 10:20 AM      Component Value Range Status Comment   Specimen Description URINE, RANDOM   Final    Special Requests NONE   Final    Setup Time [id number]   Final    Colony Count NO GROWTH   Final    Culture NO GROWTH   Final    Report Status 05/08/2011 FINAL   Final   CULTURE, BLOOD (ROUTINE X 2)     Status: Normal   Collection Time   05/07/11 11:20 AM      Component Value Range Status Comment   Specimen Description BLOOD   Final    Special Requests     Final    Value: BOTTLES DRAWN AEROBIC AND ANAEROBIC 5CC RT SUBCLAVIAN VEIN   Setup Time [id number]   Final    Culture NO GROWTH 5 DAYS   Final    Report Status 05/13/2011 FINAL   Final   CULTURE, BLOOD (ROUTINE X 2)     Status: Normal   Collection Time   05/07/11 11:20 AM      Component Value Range Status Comment   Specimen Description BLOOD   Final    Special Requests     Final    Value: BOTTLES DRAWN AEROBIC AND ANAEROBIC 5CC RT SUBCLAVIAN VEIN   Setup Time [id number]   Final    Culture NO GROWTH 5 DAYS   Final    Report Status 05/13/2011 FINAL   Final   CULTURE, RESPIRATORY     Status: Normal   Collection Time   05/13/11  6:02 AM      Component Value Range Status Comment   Specimen Description ENDOTRACHEAL   Final    Special Requests NONE   Final    Gram Stain  Final    Value: FEW WBC PRESENT, PREDOMINANTLY PMN     RARE SQUAMOUS EPITHELIAL CELLS PRESENT     NO ORGANISMS SEEN   Culture NO GROWTH 2 DAYS   Final    Report Status 05/15/2011 FINAL   Final     Anti-infectives     Start     Dose/Rate Route Frequency Ordered Stop   05/07/11 1400   imipenem-cilastatin (PRIMAXIN) 500 mg in sodium chloride 0.9 % 100 mL IVPB        500 mg 200 mL/hr over 30 Minutes Intravenous 3 times per day 05/07/11 1106 05/21/11 1359   05/07/11 1200   micafungin (MYCAMINE) 100 mg in sodium chloride 0.9 % 100 mL IVPB  Status:  Discontinued     Comments: PER PHARMACY      100 mg 100 mL/hr over 1 Hours Intravenous Every 24 hours 05/07/11 1032 05/19/11 1103   05/07/11 1200   vancomycin (VANCOCIN) 1,250 mg in sodium chloride 0.9 % 250 mL IVPB  Status:  Discontinued        1,250 mg 166.7 mL/hr over 90 Minutes Intravenous Every 24 hours 05/07/11 1106 05/19/11 1103   05/07/11 1000   micafungin (MYCAMINE) 150 mg in sodium chloride 0.9 % 100 mL IVPB  Status:  Discontinued     Comments: PER PHARMACY       150 mg 100 mL/hr over 1 Hours Intravenous Daily 05/07/11 0951 05/07/11 1029   05/07/11 0300   ertapenem (INVANZ) 1 g in sodium chloride 0.9 % 50 mL IVPB        1 g 100 mL/hr over 30 Minutes Intravenous  Once 05/07/11 0300 05/07/11 0348   05/07/11 0130   cefTRIAXone (ROCEPHIN) 1 g in dextrose 5 % 50 mL IVPB        1 g 100 mL/hr over 30 Minutes Intravenous  Once 05/07/11 0122 05/07/11 0200          Goal of Therapy:  Appropriate dosing for renal fxn & clinical status  Assessment/Plan:  79yof on day #12/14 Primaxin for bowel perforation, Vancomycin & Micafungin stopped this AM.  All cx are NTD.  Cr is stable and with good uop, remains afebrile.  Will continue current dosing and f/u AM.  Jillyn Stacey P 05/19/2011,12:48 PM

## 2011-05-19 NOTE — Progress Notes (Signed)
UR Completed.   [name] Jane 05/19/2011  

## 2011-05-19 NOTE — Progress Notes (Signed)
Nutrition Follow-up  Meds:     . feeding supplement  30 mL Per Tube QID  . feeding supplement (VITAL 1.5 CAL)  1,000 mL Per Tube Q24H  . free water  250 mL Per Tube Q6H  . imipenem-cilastatin  500 mg Intravenous Q8H  . insulin aspart  0-4 Units Subcutaneous Q4H  . lip balm  1 application Topical BID  . magnesium sulfate infusion  2 g Intravenous Once  . micafungin (MYCAMINE) IV  100 mg Intravenous Q24H  . pantoprazole (PROTONIX) IV  40 mg Intravenous Q12H  . potassium chloride  10 mEq Intravenous Q1 Hr x 6  . potassium chloride  40 mEq Per Tube Once  . vancomycin  1,250 mg Intravenous Q24H  . DISCONTD: antiseptic oral rinse  15 mL Mouth Rinse q12n4p  . DISCONTD: chlorhexidine  15 mL Mouth Rinse BID  . DISCONTD: insulin glargine  10 Units Subcutaneous Q24H    Labs:  BMET    Component Value Date/Time   NA 143 05/19/2011 0510   K 3.2* 05/19/2011 0510   CL 105 05/19/2011 0510   CO2 27 05/19/2011 0510   GLUCOSE 126* 05/19/2011 0510   BUN 11 05/19/2011 0510   CREATININE 0.56 05/19/2011 0510   CALCIUM 8.4 05/19/2011 0510   GFRNONAA 88* 05/19/2011 0510   GFRAA >90 05/19/2011 0510     Wt. Status: 165lbs- approx 20-25lbs wt loss x 5 days likely measurement error although pt not receiving adequate nutr therefore pt likely experienced decrease in wt although not this significant  Diet: NPO, TF order is for pivot 1.5@ 47ml/hr prostat 4 times daily to provide 1548kcals/117gmprot to meet nutr needs. However TF currently held pt jtube dislodged although per MD progress notes pt experiencing tolerance issues when TF are running. Per progress note TF stopped previously secondary to residuals however this is not appropriate in context of jtube feedings. Plan is possibly to replace jtube  Nutr dx: Inadequate oral intake- ongoing  Goal: meet greater than or equal to 60% energy/100% prot needs per ASPEN guidelines for critically ill obese patients-not met  Intervention: 1. If jtube replaced  recommend resume TF as previously ordered as adequate to meet needs. 2. If unable to restart EN consider alternate means of nutrition  Monitor: TF initiation/ tolerance/adequacy, wt trends, labs, nutr adequacy   Pager [phone]

## 2011-05-19 NOTE — Progress Notes (Signed)
12 Days Post-Op  Subjective: Pt ok. Denies N/V. Thirsty. J-tube has come out and was replaced by smaller foley. Significant leakage of thick bilious drainage through and around this tube. Pain control ok.  Objective: Vital signs in last 24 hours: Temp:  [98.6 F (37 C)-99.5 F (37.5 C)] 98.6 F (37 C) (11/20 0900) Pulse Rate:  [70-124] 112  (11/20 0800) Resp:  [8-35] 29  (11/20 0800) BP: (99-170)/(62-119) 164/84 mmHg (11/20 0800) SpO2:  [90 %-100 %] 100 % (11/20 0800) Weight:  [165 lb 9.1 oz (75.1 kg)] 165 lb 9.1 oz (75.1 kg) (11/20 0500)    Intake/Output this shift: Total I/O In: 75 [I.V.:75] Out: 200 [Urine:200]  Physical Exam: Abdomen: soft, ND, NT Wound clean. G-tube intact, still with dark, thick bile J-tube (Foley) intact JP#2 remains to have turbid brown output.  Labs: CBC  Basename 05/19/11 0510 05/18/11 0645  WBC 10.1 10.6*  HGB 9.2* 8.8*  HCT 29.3* 28.1*  PLT 401* 460*   BMET  Basename 05/19/11 0510 05/18/11 2000  NA 143 141  K 3.2* 4.5  CL 105 106  CO2 27 27  GLUCOSE 126* 84  BUN 11 9  CREATININE 0.56 0.45*  CALCIUM 8.4 7.6*   LFT  Basename 05/19/11 0510  PROT 6.1  ALBUMIN 1.8*  AST 17  ALT 11  ALKPHOS 105  BILITOT 0.4  BILIDIR --  IBILI --  LIPASE --   PT/INR No results found for this basename: LABPROT:2,INR:2 in the last 72 hours ABG  Basename 05/17/11 0830 05/17/11 0425  PHART 7.501* 7.525*  HCO3 29.7* 30.7*    Studies/Results: Ct Abdomen Pelvis W Contrast  05/18/2011  *RADIOLOGY REPORT*  Clinical Data: Recent of pill Roth to surgery.  Intra-abdominal abscess.  CT ABDOMEN AND PELVIS WITH CONTRAST  Technique:  Multidetector CT imaging of the abdomen and pelvis was performed following the standard protocol during bolus administration of intravenous contrast.  Contrast: OMNIPAQUE IOHEXOL 300 MG/ML IV SOLN  Comparison: 05/07/2011  Findings: Small bilateral pleural effusions with overlying atelectasis noted.  No pericardial  effusion.  No focal liver abnormality noted.  No splenic abnormalities identified.  The adrenal glands both appear within normal limits.  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  A cyst is noted within the upper pole of the left kidney measuring 0.6 cm.  There is no upper abdominal adenopathy.  No pelvic adenopathy noted.  The urinary bladder is collapsed around a Foley catheter balloon.  Within the upper abdomen there are postoperative changes consistent with the overall to surgery. A percutaneous gastrostomy tube is in place.  Additionally there is a left abdominal percutaneous jejunostomy tube.  A surgical drainage catheter is identified which enters the right abdomen and crosses the midline to the left upper quadrant of the abdomen.  There has been cholecystectomy.  The proximal small bowel loops are increased in caliber measuring up to 4.9 cm.  There is enteric contrast material identified within the colon up to the level of the splenic flexure.  There is a moderate amount of fluid identified within the upper abdomen.  At this time there is no mature, enhancing wall surrounding the upper abdominal fluid to suggest abscess.  Review of the visualized osseous structures is significant for lumbar degenerative disc disease and right total hip arthroplasty device.  IMPRESSION:  1.  No discrete peripherally enhancing fluid collections within the upper abdomen to suggest abscess.  There is a moderate amount of fluid  within the upper abdomen which is nonspecific in the early postoperative period. 2.  Postop changes consistent with Billroth II gastric surgery. There is nonspecific increased caliber of the proximal small bowel which may represent postoperative ileus. 3.  Cholecystectomy.  Original Report Authenticated By: Rosealee Albee, M.D.   Dg Chest Port 1 View  05/19/2011  *RADIOLOGY REPORT*  Clinical Data: Check endotracheal tube  PORTABLE CHEST - 1 VIEW  Comparison:  05/17/2011  Findings: Cardiomediastinal silhouette is stable.  Endotracheal tube has been removed.  No diagnostic pneumothorax.  Mild basilar atelectasis or infiltrate.  Stable right arm PICC line position. No pulmonary edema.  IMPRESSION: Endotracheal tube has been removed.  No diagnostic pneumothorax. Mild basilar atelectasis or infiltrate.  Stable right arm PICC line position. No pulmonary edema.  Original Report Authenticated By: Natasha Mead, M.D.   Dg Abd Portable 1v  05/17/2011  *RADIOLOGY REPORT*  Clinical Data: Abdominal distention  ABDOMEN - 1 VIEW  Comparison: 05/12/2011  Findings: Drains are again noted over the abdomen.  Minimal retained contrast noted in nondilated cecum.  Clips overlie the left of midline. Presence or absence of air fluid levels or free air cannot be assessed on this single supine view.  No gas filled dilated loop of bowel is identified.  There is minimal possible free air beneath the left hemidiaphragm as seen on prior exams. Right total hip arthroplasty again noted.  IMPRESSION: No supine evidence for gaseous bowel distention.  Original Report Authenticated By: Harrel Lemon, M.D.    Assessment: Principal Problem:  *Sepsis Active Problems:  DIABETES MELLITUS, TYPE II  HYPERTENSION  Anemia  Hyponatremia  Abdominal pain, acute  Metabolic acidosis  Esophagitis, erosive  Acute respiratory failure  s/p Procedure(s): EXPLORATORY LAPAROTOMY GASTROSTOMY GASTRECTOMY CHOLECYSTECTOMY Plan: CT shows ant/sup fluid without definite abscess. This is likely where the output to JP#2 is coming from.  Difficult to know if J-tube will work appropriately since original tube fell out and now simple foley in place, this is too small for the tract, thus the leak. Will d/w MD regarding having IR replace and upsize the J-tube vs.... Cont abx. Keep NPO. No obstruction on CT, so would prefer to use tubes for TF but may need to consider TPN as TF tolerance has not been good and  her nutrition has been down.  LOS: 13 days    [name] 05/19/2011

## 2011-05-20 LAB — CBC
Hemoglobin: 8.7 g/dL — ABNORMAL LOW (ref 12.0–15.0)
MCH: 26.2 pg (ref 26.0–34.0)
MCHC: 32.1 g/dL (ref 30.0–36.0)
MCV: 81.6 fL (ref 78.0–100.0)

## 2011-05-20 LAB — COMPREHENSIVE METABOLIC PANEL
BUN: 11 mg/dL (ref 6–23)
CO2: 24 mEq/L (ref 19–32)
Chloride: 104 mEq/L (ref 96–112)
Creatinine, Ser: 0.5 mg/dL (ref 0.50–1.10)
GFR calc non Af Amer: >90 mL/min (ref >90)
Total Bilirubin: 0.3 mg/dL (ref 0.3–1.2)

## 2011-05-20 LAB — GLUCOSE, CAPILLARY
Glucose-Capillary: 107 mg/dL — ABNORMAL HIGH (ref 70–99)
Glucose-Capillary: 130 mg/dL — ABNORMAL HIGH (ref 70–99)
Glucose-Capillary: 174 mg/dL — ABNORMAL HIGH (ref 70–99)
Glucose-Capillary: 155 mg/dL — ABNORMAL HIGH (ref 70–99)
Glucose-Capillary: 206 mg/dL — ABNORMAL HIGH (ref 70–99)

## 2011-05-20 LAB — DIFFERENTIAL
Basophils Relative: 1 % (ref 0–1)
Eosinophils Absolute: 0.1 10*3/uL (ref 0.0–0.7)
Lymphs Abs: 1.4 10*3/uL (ref 0.7–4.0)
Monocytes Absolute: 0.5 10*3/uL (ref 0.1–1.0)
Neutrophils Relative %: 78 % — ABNORMAL HIGH (ref 43–77)

## 2011-05-20 LAB — PREALBUMIN: Prealbumin: 7.6 mg/dL — ABNORMAL LOW (ref 17.0–34.0)

## 2011-05-20 LAB — TRIGLYCERIDES: Triglycerides: 161 mg/dL — ABNORMAL HIGH (ref <150)

## 2011-05-20 LAB — MAGNESIUM: Magnesium: 1.6 mg/dL (ref 1.5–2.5)

## 2011-05-20 MED ORDER — FAT EMULSION 20 % IV EMUL
250.0000 mL | INTRAVENOUS | Status: DC
  Administered 2011-05-20: 250 mL via INTRAVENOUS
  Filled 2011-05-20: qty 250

## 2011-05-20 MED ORDER — POTASSIUM CHLORIDE 10 MEQ/50ML IV SOLN
10.0000 meq | INTRAVENOUS | Status: AC
  Administered 2011-05-20: 10 meq via INTRAVENOUS

## 2011-05-20 MED ORDER — TRACE MINERALS CR-CU-MN-SE-ZN 10-1000-500-60 MCG/ML IV SOLN
INTRAVENOUS | Status: DC
  Administered 2011-05-20: 18:00:00 via INTRAVENOUS
  Filled 2011-05-20: qty 2000

## 2011-05-20 MED ORDER — POTASSIUM CHLORIDE IN NACL 20-0.45 MEQ/L-% IV SOLN
INTRAVENOUS | Status: DC
  Administered 2011-05-20: 17:00:00 via INTRAVENOUS
  Filled 2011-05-20 (×2): qty 1000

## 2011-05-20 MED ORDER — POTASSIUM CHLORIDE 10 MEQ/50ML IV SOLN
10.0000 meq | INTRAVENOUS | Status: DC
  Administered 2011-05-20: 10 meq via INTRAVENOUS
  Filled 2011-05-20: qty 200

## 2011-05-20 MED ORDER — SODIUM CHLORIDE 0.9 % IJ SOLN
10.0000 mL | INTRAMUSCULAR | Status: DC | PRN
  Administered 2011-05-20 – 2011-07-03 (×81): 10 mL

## 2011-05-20 MED ORDER — MAGNESIUM SULFATE 50 % IJ SOLN: 2.0000 g | Freq: Once | INTRAMUSCULAR | Status: DC

## 2011-05-20 MED ORDER — POTASSIUM CHLORIDE 10 MEQ/50ML IV SOLN: 10.0000 meq | INTRAVENOUS | Status: DC

## 2011-05-20 MED ORDER — MAGNESIUM SULFATE 50 % IJ SOLN
2.0000 g | Freq: Once | INTRAMUSCULAR | Status: AC
  Administered 2011-05-20: 2 g via INTRAVENOUS
  Filled 2011-05-20 (×2): qty 4

## 2011-05-20 MED ORDER — HEPARIN SODIUM (PORCINE) 5000 UNIT/ML IJ SOLN
5000.0000 [IU] | Freq: Three times a day (TID) | INTRAMUSCULAR | Status: DC
  Administered 2011-05-20 – 2011-06-16 (×81): 5000 [IU] via SUBCUTANEOUS
  Filled 2011-05-20 (×84): qty 1

## 2011-05-20 NOTE — Progress Notes (Signed)
The patient was seen, examined and PA note reviewed and data reviewed.  I agree with the plan of action. 

## 2011-05-20 NOTE — Progress Notes (Signed)
13 Days Post-Op  Subjective: Pt ok. Denies pain. No N/V. Still bilious output. Wound manager over J-tube site working well.  Objective: Vital signs in last 24 hours: Temp:  [98.2 F (36.8 C)-99 F (37.2 C)] 98.2 F (36.8 C) (11/21 0800) Pulse Rate:  [87-112] 99  (11/21 0700) Resp:  [18-31] 23  (11/21 0700) BP: (131-163)/(53-94) 145/93 mmHg (11/21 0700) SpO2:  [95 %-100 %] 99 % (11/21 0700)    Intake/Output this shift: Total I/O In: 200 [I.V.:150; TPN:50] Out: 110 [Urine:110]  Physical Exam: Abdomen: soft, ND.  G-tube intact, thin watery output now after eating ice chips. Otherwise bilious in canister. J-tube site, tube now gone, thus creating EC fistula, controlled into Eakins pouch to gravity drain. JP#1-serous output, scant JP#2- still some brown output, trend down   Labs: CBC  Basename 05/20/11 0610 05/19/11 0510  WBC 9.2 10.1  HGB 8.7* 9.2*  HCT 27.1* 29.3*  PLT 369 401*   BMET  Basename 05/20/11 0610 05/19/11 0510  NA 137 143  K 3.4* 3.2*  CL 104 105  CO2 24 27  GLUCOSE 143* 126*  BUN 11 11  CREATININE 0.50 0.56  CALCIUM 8.2* 8.4   LFT  Basename 05/20/11 0610  PROT 5.7*  ALBUMIN 1.7*  AST 13  ALT 8  ALKPHOS 87  BILITOT 0.3  BILIDIR --  IBILI --  LIPASE --   PT/INR No results found for this basename: LABPROT:2,INR:2 in the last 72 hours ABG No results found for this basename: PHART:2,PCO2:2,PO2:2,HCO3:2 in the last 72 hours  Studies/Results: Ct Abdomen Pelvis W Contrast  05/18/2011  *RADIOLOGY REPORT*  Clinical Data: Recent of pill Roth to surgery.  Intra-abdominal abscess.  CT ABDOMEN AND PELVIS WITH CONTRAST  Technique:  Multidetector CT imaging of the abdomen and pelvis was performed following the standard protocol during bolus administration of intravenous contrast.  Contrast: OMNIPAQUE IOHEXOL 300 MG/ML IV SOLN  Comparison: 05/07/2011  Findings: Small bilateral pleural effusions with overlying atelectasis noted.  No pericardial  effusion.  No focal liver abnormality noted.  No splenic abnormalities identified.  The adrenal glands both appear within normal limits.  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  A cyst is noted within the upper pole of the left kidney measuring 0.6 cm.  There is no upper abdominal adenopathy.  No pelvic adenopathy noted.  The urinary bladder is collapsed around a Foley catheter balloon.  Within the upper abdomen there are postoperative changes consistent with the overall to surgery. A percutaneous gastrostomy tube is in place.  Additionally there is a left abdominal percutaneous jejunostomy tube.  A surgical drainage catheter is identified which enters the right abdomen and crosses the midline to the left upper quadrant of the abdomen.  There has been cholecystectomy.  The proximal small bowel loops are increased in caliber measuring up to 4.9 cm.  There is enteric contrast material identified within the colon up to the level of the splenic flexure.  There is a moderate amount of fluid identified within the upper abdomen.  At this time there is no mature, enhancing wall surrounding the upper abdominal fluid to suggest abscess.  Review of the visualized osseous structures is significant for lumbar degenerative disc disease and right total hip arthroplasty device.  IMPRESSION:  1.  No discrete peripherally enhancing fluid collections within the upper abdomen to suggest abscess.  There is a moderate amount of fluid within the upper abdomen which is nonspecific in the early  postoperative period. 2.  Postop changes consistent with Billroth II gastric surgery. There is nonspecific increased caliber of the proximal small bowel which may represent postoperative ileus. 3.  Cholecystectomy.  Original Report Authenticated By: Rosealee Albee, M.D.   Dg Chest Port 1 View  05/19/2011  *RADIOLOGY REPORT*  Clinical Data: Check endotracheal tube  PORTABLE CHEST - 1 VIEW  Comparison:  05/17/2011  Findings: Cardiomediastinal silhouette is stable.  Endotracheal tube has been removed.  No diagnostic pneumothorax.  Mild basilar atelectasis or infiltrate.  Stable right arm PICC line position. No pulmonary edema.  IMPRESSION: Endotracheal tube has been removed.  No diagnostic pneumothorax. Mild basilar atelectasis or infiltrate.  Stable right arm PICC line position. No pulmonary edema.  Original Report Authenticated By: Natasha Mead, M.D.    Assessment: Principal Problem:  *Sepsis Active Problems:  DIABETES MELLITUS, TYPE II  HYPERTENSION  Anemia  Hyponatremia  Abdominal pain, acute  Metabolic acidosis  Esophagitis, erosive  Acute respiratory failure  s/p Procedure(s): EXPLORATORY LAPAROTOMY GASTROSTOMY GASTRECTOMY CHOLECYSTECTOMY Plan: COntinue TNA,NPO, drain care. Agree with move to SDU  LOS: 14 days    [name] 05/20/2011

## 2011-05-20 NOTE — Progress Notes (Signed)
HPI:  [name] is a 77 y.o. female transferred from [facility] on 05/06/2011 with headache and abd pain. Found to have intraperitoneal free air, and perforated pylorus complicated by septic shock and VDRF.  PMHx HTN, DM, GERD, Dementia, Depression, OA, Pancreatitis, Angiomyolipoma Rt kidney, Anemia.  Antibiotics:   Vanc 11/8>>> 11/20 Primaxin 11/8>>> plan stop 11/22 Micofungin 11/8>>> 11/20  Cultures:   MSSA PCR 11/18 - POSITIVE  MRSA PCR 11/18 - NEGATIVE  BCx2 11/8>>>NTD  Urine 11/8>>> NTD  Lactate 11/8>>> 3  Pct 11/8>>> 130  Cortisol 11/8 >> 58  ...................  PCt 11/11 - 1.4  Lactate 11/11 - 7.24  Access:  Left IJ 11/8>>>11/? PICC rt 11/?>>> ETT 11/8>>> 11/18 Rt fem aline 11/8>>>11/10  Foley 11/7 (at [employer]) >>  Best Practice: DVT: SCD's GI: Protonix ICU hyperglycemia protocol Sepsis protocol completed.  Studies: CT abdo /pelvis 11/19>>>No discrete peripherally enhancing fluid collections within the<BR>upper abdomen to suggest abscess. There is a moderate amount of<BR>fluid within the upper abdomen which is nonspecific in the early<BR>postoperative period.<BR>2. Postop changes consistent with Billroth II gastric surgery.<BR>There is nonspecific increased caliber of the proximal small bowel<BR>which may represent postoperative ileus.<BR>3. Cholecystectomy.<BR>  Subjective: "hungry"  Physical Exam: Filed Vitals:   05/20/11 1000  BP: 132/101  Pulse: 101  Temp:   Resp: 23       Intake/Output Summary (Last 24 hours) at 05/20/11 1058 Last data filed at 05/20/11 1000  Gross per 24 hour  Intake   3425 ml  Output   2743 ml  Net    682 ml      Neuro: Follows commands, no distress Cardiac: RRR, Nl S1/S2, -M/R/G. Pulmonary:CTA to bases GI: Soft, NT, ND and +BS. Output noted 1 liter Extremities: 1+ edema and -tenderness.  Labs: CBC    Component Value Date/Time   WBC 9.2 05/20/2011 0610   RBC 3.32* 05/20/2011 0610   HGB 8.7* 05/20/2011 0610   HCT  27.1* 05/20/2011 0610   PLT 369 05/20/2011 0610   MCV 81.6 05/20/2011 0610   MCH 26.2 05/20/2011 0610   MCHC 32.1 05/20/2011 0610   RDW 20.4* 05/20/2011 0610   LYMPHSABS 1.4 05/20/2011 0610   MONOABS 0.5 05/20/2011 0610   EOSABS 0.1 05/20/2011 0610   BASOSABS 0.1 05/20/2011 0610    BMET    Component Value Date/Time   NA 137 05/20/2011 0610   K 3.4* 05/20/2011 0610   CL 104 05/20/2011 0610   CO2 24 05/20/2011 0610   GLUCOSE 143* 05/20/2011 0610   BUN 11 05/20/2011 0610   CREATININE 0.50 05/20/2011 0610   CALCIUM 8.2* 05/20/2011 0610   GFRNONAA >90 05/20/2011 0610   GFRAA >90 05/20/2011 0610   Mg: 1.7 Phos: 4.4  Chest Xray: mild atx CT reviewed 11/20  Assessment & Plan:  Sepsis (05/07/2011), Erosive esophagitis and acute abdomen.   Assessment: Due to a perforated pyloric ulcer s/p laparotomy 11/07. Increased output J tube 11/19   Plan:  CT reviewed, no asbcess, clinicaly appears well Per ccs tpn started ppi lft reviewed   Acute respiratory failure (05/07/2011)  Plan: Appears well, add IS, low O2 needs  DIABETES MELLITUS, TYPE II (09/13/2007)   Plan npo  HYPERTENSION (09/13/2007)   Assessment: By history.   Plan: hypernatremia  Improved Continue 1/2 NS, replace K, kvo Chem in am , repalce mag tpn  Anemia (01/26/2011)   Assessment: Chronic disease and inflammation, slow drop and no evidence of active bleed.   Plan:  Monitor, no need for  transfusion at this point. High risk DVT, last coags wnl, plat wnl, no bleding Add sub q hep  Hypernatremia (01/28/2011) hypokalemia   Assessment: Due to diureses.   Plan:  1/2 NS with Kckl bmet in pm and am  1/2 NS maintain I/O not accurate last 24 hrs Maintain even balance  Hypokalemia k in bag, may need to dc, check k again in am  k supp IV x 2 reaplce mag  Abdominal pain, acute (05/07/2011)   Assessment: Due perf.   Plan: consider tpn , ppi    Move to tele To traid service Call if needed Nelda Bucks., MD

## 2011-05-20 NOTE — Progress Notes (Signed)
Patient ID: [name], female   DOB: 1932/06/17, 77 y.o.   MRN: [id number] To triad team 9 . traid will change the service in epic Thanks you Ikeya Brockel

## 2011-05-20 NOTE — Progress Notes (Signed)
Physical Therapy Treatment [name] MRN: [id number] DOB: [birth year] Today's Date: 05/20/2011  PT Assessment/Plan  PT - Assessment/Plan Comments on Treatment Session: Patient with improved functional mobility today, but remians limited by nausea and weakness. PT Plan: Discharge plan remains appropriate Follow Up Recommendations: Skilled nursing facility Equipment Recommended: Defer to next venue PT Goals  Acute Rehab PT Goals PT Goal: Supine/Side to Sit - Progress: Partly met (await for consistency) PT Transfer Goal: Bed to Chair/Chair to Bed - Progress: Progressing toward goal  PT Treatment Precautions/Restrictions  Precautions Precautions: Fall Precaution Comments: Secondary to patient restless Restrictions Weight Bearing Restrictions: Yes Mobility (including Balance) Bed Mobility Bed Mobility: Yes Supine to Sit: 4: Min assist;HOB elevated (Comment degrees) (30 degrees) Supine to Sit Details (indicate cue type and reason): Patient assisted with initiation of lower extremities. Minimal assist for trunk initiation and through range Sitting - Scoot to Edge of Bed: 3: Mod assist Sitting - Scoot to Edge of Bed Details (indicate cue type and reason): Patient utilizes bilateral upper extremities. Difficulty reaching edge of bed secondary to weakness of upper extremities Transfers Transfers: Yes Stand Pivot Transfers: 2: Max assist Stand Pivot Transfer Details (indicate cue type and reason): From bed to recliner. Patient able to achieve approximately 3/4 of a stand. Able o take pivotal steps with support. fatigues quickly and flexed posture  Balance Balance Assessed: Yes Static Sitting Balance Static Sitting - Balance Support: No upper extremity supported Static Sitting - Level of Assistance: 5: Stand by assistance Static Sitting - Comment/# of Minutes: 2 minutes Exercise  General Exercises - Lower Extremity Ankle Circles/Pumps: AROM;Both;10  reps;Seated Long Arc Quad: AROM;Both;5 reps;Seated Hip Flexion/Marching: AROM;Both;5 reps;Seated End of Session PT - End of Session Activity Tolerance: Patient limited by fatigue (and nausea) Patient left: in chair (sitter present) Nurse Communication: Mobility status for transfers General Behavior During Session: Restless Cognition: [facility] for tasks performed  Edwyna Perfect, PT  Pager [phone]  05/20/2011, 9:49 AM

## 2011-05-20 NOTE — Progress Notes (Signed)
Clinical Social Worker reviewed chart.  CSW discussed bed offers with pt's step-dtr.  Step dtr would like pt to be transferred to [facility], CSW confirmed bed offer with facilty.  CSW to continue to follow and assist as needed.   Angelia Mould, MSW, [place] [phone]

## 2011-05-20 NOTE — Progress Notes (Signed)
PARENTERAL NUTRITION CONSULT NOTE - follow-up  Pharmacy Consult for TPN Indication: Not tolerating TF- Jtube out  Allergies  Allergen Reactions  . Other Swelling    Pinto beans cause swelling and hives  . Penicillins Swelling    Patient Measurements: Height: 5\' 6"  (167.6 cm) Weight: 165 lb 9.1 oz (75.1 kg) IBW/kg (Calculated) : 59.3  Adjusted Body Weight:  65Kg Usual Weight:    Vital Signs: Temp: 98.2 F (36.8 C) (11/21 0800) Temp src: Axillary (11/21 0800) BP: 146/71 mmHg (11/21 0900) Pulse Rate: 92  (11/21 0900) Intake/Output from previous day: 11/20 0701 - 11/21 0700 In: 3315 [I.V.:1650; IV Piggyback:315; TPN:1200] Out: 2933 [Urine:1680; Drains:1078; Stool:175] Intake/Output from this shift: Total I/O In: 200 [I.V.:150; TPN:50] Out: 110 [Urine:110]  Labs:  [facility] 05/20/11 0610 05/19/11 0510 05/18/11 0645  WBC 9.2 10.1 10.6*  HGB 8.7* 9.2* 8.8*  HCT 27.1* 29.3* 28.1*  PLT 369 401* 460*  APTT -- -- --  INR -- -- --     Basename 05/20/11 0610 05/19/11 0510 05/18/11 2000  NA 137 143 141  K 3.4* 3.2* 4.5  CL 104 105 106  CO2 24 27 27   GLUCOSE 143* 126* 84  BUN 11 11 9   CREATININE 0.50 0.56 0.45*  LABCREA -- -- --  CREAT24HRUR -- -- --  CALCIUM 8.2* 8.4 7.6*  MG 1.6 2.0 2.0  PHOS 3.4 3.9 3.9  PROT 5.7* 6.1 --  ALBUMIN 1.7* 1.8* --  AST 13 17 --  ALT 8 11 --  ALKPHOS 87 105 --  BILITOT 0.3 0.4 --  BILIDIR -- -- --  IBILI -- -- --  PREALBUMIN -- -- --  CHOLHDL -- -- --  CHOL 90 -- --   Estimated Creatinine Clearance: 61 ml/min (by C-G formula based on Cr of 0.5).    Basename 05/20/11 0756 05/20/11 0353 05/19/11 2325  GLUCAP 130* 180* 157*    Medical History: Past Medical History  Diagnosis Date  . Hypertension   . Reflux   . Right elbow pain     OTIF  . Dementia   . Depression   . Osteoarthritis   . Pancreatitis 11/2007    HOP  . Angiomyolipoma of kidney     right  . Ulcerative esophagitis 12/05/2007    hx elevated gastrin, severe  on EGD by Dr Jena Gauss , h pylori negative  . Hiatal hernia   . S/P colonoscopy 2009    pt reports normal by Dr Lovell Sheehan  . Diabetes mellitus   . Anemia   . Hyponatremia     Medications:  Scheduled:     . imipenem-cilastatin  500 mg Intravenous Q8H  . insulin aspart  0-4 Units Subcutaneous Q4H  . lip balm  1 application Topical BID  . magnesium sulfate infusion  2 g Intravenous Once  . pantoprazole (PROTONIX) IV  40 mg Intravenous Q12H  . potassium chloride  10 mEq Intravenous Q1 Hr x 4  . potassium chloride  10 mEq Intravenous Q1 Hr x 4  . DISCONTD: feeding supplement  30 mL Per Tube QID  . DISCONTD: feeding supplement (VITAL 1.5 CAL)  1,000 mL Per Tube Q24H  . DISCONTD: free water  250 mL Per Tube Q6H  . DISCONTD: micafungin (MYCAMINE) IV  100 mg Intravenous Q24H  . DISCONTD: vancomycin  1,250 mg Intravenous Q24H    Insulin Requirements in the past 24 hours:  5 units of SSI  Current Nutrition:  Vital TF and Prostat unable to be administered  due poor tolerance and JTube displacement.  Assessment: TPN continues for increased J-tube output and and J-tube displacement. JT output bilious, ongoing. Noted BM reported. Hypokalemia noted, Mg below goal ~2. Other lytes ok, acid-base status ok. LFTs and chol ok. Trig slightly above goal 150, but not high enough to compel need for reduction in IVFE provision. Glycemic control ok with isolated value >150. Noted patient only on SSI currently, required insulin in TPN in past. MIVF is 0.45ns with 20KCL at 75cc/hr.  Nutritional Goals:  1200-1500 kCal, >115 grams of protein per day  Plan:  1. Increase TPN to 45ml/hr today. Will hold off on adding insulin. Will add MVI and trace to bag today. 2. Decrease IVF to 50cc/hr at 1800 today 3. Will give 4 runs of KCL and 2gm IV Mg sulfate today. 4. Will f/up CBGs and assess need for insulin in TPN. 5. Will f/up prealbumin in AM. 6. Will check BMET, Mg and Phos in AM.  Allena Katz, Mady Haagensen  Krishnavadan 05/20/2011,9:51 AM

## 2011-05-21 LAB — DIFFERENTIAL
Basophils Relative: 1 % (ref 0–1)
Eosinophils Relative: 1 % (ref 0–5)
Lymphs Abs: 1.3 10*3/uL (ref 0.7–4.0)
Monocytes Absolute: 0.5 10*3/uL (ref 0.1–1.0)
Neutrophils Relative %: 75 % (ref 43–77)

## 2011-05-21 LAB — COMPREHENSIVE METABOLIC PANEL
ALT: 7 U/L (ref 0–35)
Albumin: 1.8 g/dL — ABNORMAL LOW (ref 3.5–5.2)
Alkaline Phosphatase: 79 U/L (ref 39–117)
Potassium: 3.1 mEq/L — ABNORMAL LOW (ref 3.5–5.1)
Sodium: 136 mEq/L (ref 135–145)
Total Protein: 5.6 g/dL — ABNORMAL LOW (ref 6.0–8.3)

## 2011-05-21 LAB — CBC
HCT: 25.6 % — ABNORMAL LOW (ref 36.0–46.0)
Hemoglobin: 8.3 g/dL — ABNORMAL LOW (ref 12.0–15.0)
MCH: 26.2 pg (ref 26.0–34.0)
MCV: 80.8 fL (ref 78.0–100.0)
RBC: 3.17 MIL/uL — ABNORMAL LOW (ref 3.87–5.11)

## 2011-05-21 LAB — GLUCOSE, CAPILLARY
Glucose-Capillary: 246 mg/dL — ABNORMAL HIGH (ref 70–99)
Glucose-Capillary: 196 mg/dL — ABNORMAL HIGH (ref 70–99)
Glucose-Capillary: 210 mg/dL — ABNORMAL HIGH (ref 70–99)

## 2011-05-21 MED ORDER — SODIUM CHLORIDE 0.45 % IV SOLN
INTRAVENOUS | Status: DC
  Filled 2011-05-21: qty 1000

## 2011-05-21 MED ORDER — MORPHINE SULFATE 4 MG/ML IJ SOLN
3.0000 mg | INTRAMUSCULAR | Status: DC | PRN
  Administered 2011-05-21 – 2011-05-23 (×8): 3 mg via INTRAVENOUS
  Administered 2011-05-23: 4 mg via INTRAVENOUS
  Administered 2011-05-23: 3 mg via INTRAVENOUS
  Administered 2011-05-23: 4 mg via INTRAVENOUS
  Administered 2011-05-24 – 2011-05-28 (×20): 3 mg via INTRAVENOUS
  Filled 2011-05-21 (×33): qty 1

## 2011-05-21 MED ORDER — INSULIN REGULAR HUMAN 100 UNIT/ML IJ SOLN
INTRAVENOUS | Status: AC
  Administered 2011-05-21: 18:00:00 via INTRAVENOUS
  Filled 2011-05-21: qty 2000

## 2011-05-21 MED ORDER — LORAZEPAM 2 MG/ML IJ SOLN
0.5000 mg | Freq: Three times a day (TID) | INTRAMUSCULAR | Status: DC | PRN
  Administered 2011-05-21 – 2011-07-05 (×27): 0.5 mg via INTRAVENOUS
  Filled 2011-05-21 (×32): qty 1

## 2011-05-21 MED ORDER — POTASSIUM CHLORIDE 10 MEQ/100ML IV SOLN
10.0000 meq | INTRAVENOUS | Status: AC
  Administered 2011-05-21 (×3): 10 meq via INTRAVENOUS
  Filled 2011-05-21 (×4): qty 100

## 2011-05-21 MED ORDER — INSULIN REGULAR HUMAN 100 UNIT/ML IJ SOLN
INTRAMUSCULAR | Status: DC
  Filled 2011-05-21: qty 2000

## 2011-05-21 MED ORDER — FAT EMULSION 20 % IV EMUL
250.0000 mL | INTRAVENOUS | Status: DC
  Filled 2011-05-21: qty 250

## 2011-05-21 MED ORDER — SODIUM CHLORIDE 0.45 % IV SOLN
INTRAVENOUS | Status: DC
  Administered 2011-05-21 – 2011-05-25 (×5): via INTRAVENOUS
  Filled 2011-05-21 (×7): qty 1000

## 2011-05-21 MED ORDER — FAT EMULSION 20 % IV EMUL
250.0000 mL | INTRAVENOUS | Status: AC
  Administered 2011-05-21: 250 mL via INTRAVENOUS
  Filled 2011-05-21: qty 250

## 2011-05-21 NOTE — Progress Notes (Signed)
PARENTERAL NUTRITION CONSULT NOTE - FOLLOW UP  Pharmacy Consult for TPN Indication: Not tolerating TF- Jtube out   Allergies  Allergen Reactions  . Other Swelling    Pinto beans cause swelling and hives  . Penicillins Swelling    Patient Measurements: Height: 5\' 6"  (167.6 cm) Weight: 160 lb (72.576 kg) IBW/kg (Calculated) : 59.3  Adjusted Body Weight:   Usual Weight:    Vital Signs: Temp: 99.4 F (37.4 C) (11/22 0516) Temp src: Oral (11/22 0516) BP: 156/91 mmHg (11/22 0803) Pulse Rate: 76  (11/22 0516) Intake/Output from previous day: 11/21 0701 - 11/22 0700 In: 3178.5 [I.V.:1134.2; IV Piggyback:315; TPN:1129.3] Out: 2370 [Urine:1530; Drains:840] Intake/Output from this shift:    Labs:  [facility] 05/21/11 0530 05/20/11 0610 05/19/11 0510  WBC 7.9 9.2 10.1  HGB 8.3* 8.7* 9.2*  HCT 25.6* 27.1* 29.3*  PLT 318 369 401*  APTT -- -- --  INR -- -- --     Basename 05/21/11 0530 05/20/11 0610 05/19/11 0510  NA 136 137 143  K 3.1* 3.4* 3.2*  CL 103 104 105  CO2 23 24 27   GLUCOSE 148* 143* 126*  BUN 10 11 11   CREATININE 0.47* 0.50 0.56  LABCREA -- -- --  CREAT24HRUR -- -- --  CALCIUM 7.9* 8.2* 8.4  MG 1.9 1.6 2.0  PHOS 3.8 3.4 3.9  PROT 5.6* 5.7* 6.1  ALBUMIN 1.8* 1.7* 1.8*  AST 12 13 17   ALT 7 8 11   ALKPHOS 79 87 105  BILITOT 0.2* 0.3 0.4  BILIDIR -- -- --  IBILI -- -- --  PREALBUMIN -- 7.6* --  CHOLHDL -- -- --  CHOL -- 90 --   Estimated Creatinine Clearance: 60.1 ml/min (by C-G formula based on Cr of 0.47).    Basename 05/21/11 0742 05/21/11 0344 05/20/11 2356  GLUCAP 196* 246* 153*    Medications:  Scheduled:    . heparin subcutaneous  5,000 Units Subcutaneous Q8H  . imipenem-cilastatin  500 mg Intravenous Q8H  . insulin aspart  0-4 Units Subcutaneous Q4H  . lip balm  1 application Topical BID  . magnesium sulfate infusion  2 g Intravenous Once  . pantoprazole (PROTONIX) IV  40 mg Intravenous Q12H  . potassium chloride  10 mEq Intravenous  Q1 Hr x 3  . potassium chloride  10 mEq Intravenous Q1 Hr x 2  . DISCONTD: magnesium sulfate infusion  2 g Intravenous Once  . DISCONTD: potassium chloride  10 mEq Intravenous Q1 Hr x 4  . DISCONTD: potassium chloride  10 mEq Intravenous Q1 Hr x 2    Insulin Requirements in the past 24 hours:  16 units SSI  Current Nutrition:  TPN at 74ml/hr (goal 9ml/hr)  Assessment: 77 y/o previously on TPN (For erosive esophagitis with perforated pyloric ulcer s/p lap 11/7 with gastrostomy, gastrectomy and cholecystectomy) stared 05/12/11. Resumed TPN for increased J-tube output and and J-tube displacement. Will not increase TPN rate until CBGs better managed. Prealbumin only 7.6. Will cut lipids in TPN which will allow me to increase TPN rate tomorrow and provide more protein without overfeeding lipid calories.  Electrolytes: K 3.4 with Kruns x 3 per MD ordered.  Glucose: CBGs 153-246 after new TPN started last pm. Will add insulin to TPN   Nutritional Goals:  1200-1500 kCal, >115 grams of protein per day  Plan:  Con't TPN 77ml/hr and add 15 units/bag insulin Can IV Protonix be changed to po?  Merilynn Finland, [employer] 05/21/2011,10:32 AM

## 2011-05-21 NOTE — Progress Notes (Signed)
Subjective: Complaining of abdominal pain; also anxious with whole situation. No fever.  Objective: Vital signs in last 24 hours: Temp:  [98.6 F (37 C)-99.8 F (37.7 C)] 99.4 F (37.4 C) (11/22 0516) Pulse Rate:  [76-116] 76  (11/22 0516) Resp:  [20-30] 20  (11/22 0516) BP: (131-158)/(59-102) 156/91 mmHg (11/22 0803) SpO2:  [96 %-100 %] 96 % (11/22 0516) Weight:  [72.576 kg (160 lb)] 160 lb (72.576 kg) (11/22 0516) Weight change:     Intake/Output from previous day: 11/21 0701 - 11/22 0700 In: 3178.5 [I.V.:1134.2; IV Piggyback:315; TPN:1129.3] Out: 2370 [Urine:1530; Drains:840]     Physical Exam: General: Alert, awake, oriented x3, in mild distress 2/2 pain; also anxious. HEENT: No bruits, no goiter. Heart: Mild tachycardia, without murmurs, rubs, gallops. Lungs: Clear to auscultation bilaterally. Abdomen: Soft, tender to palpation, nondistended, quiet BS. Colostomy bag in place, G tube also in place; incision clean and dressed in the middle of her belly. Extremities: No clubbing cyanosis or edema with positive pedal pulses. Neuro: Grossly intact, nonfocal.   Lab Results: Basic Metabolic Panel:  Basename 05/21/11 0530 05/20/11 0610  NA 136 137  K 3.1* 3.4*  CL 103 104  CO2 23 24  GLUCOSE 148* 143*  BUN 10 11  CREATININE 0.47* 0.50  CALCIUM 7.9* 8.2*  MG 1.9 1.6  PHOS 3.8 3.4   Liver Function Tests:  Basename 05/21/11 0530 05/20/11 0610  AST 12 13  ALT 7 8  ALKPHOS 79 87  BILITOT 0.2* 0.3  PROT 5.6* 5.7*  ALBUMIN 1.8* 1.7*   CBC:  Basename 05/21/11 0530 05/20/11 0610  WBC 7.9 9.2  NEUTROABS 5.9 7.1  HGB 8.3* 8.7*  HCT 25.6* 27.1*  MCV 80.8 81.6  PLT 318 369   CBG:  Basename 05/21/11 0742 05/21/11 0344 05/20/11 2356 05/20/11 2014 05/20/11 1631 05/20/11 1152  GLUCAP 196* 246* 153* 206* 155* 174*   Fasting Lipid Panel:  Basename 05/20/11 0610  CHOL 90  HDL --  LDLCALC --  TRIG 161*  CHOLHDL --  LDLDIRECT --    Recent Results (from the  past 240 hour(s))  CULTURE, RESPIRATORY     Status: Normal   Collection Time   05/13/11  6:02 AM      Component Value Range Status Comment   Specimen Description ENDOTRACHEAL   Final    Special Requests NONE   Final    Gram Stain     Final    Value: FEW WBC PRESENT, PREDOMINANTLY PMN     RARE SQUAMOUS EPITHELIAL CELLS PRESENT     NO ORGANISMS SEEN   Culture NO GROWTH 2 DAYS   Final    Report Status 05/15/2011 FINAL   Final     Studies/Results: No results found.  Medications: Scheduled Meds:   . heparin subcutaneous  5,000 Units Subcutaneous Q8H  . imipenem-cilastatin  500 mg Intravenous Q8H  . insulin aspart  0-4 Units Subcutaneous Q4H  . lip balm  1 application Topical BID  . magnesium sulfate infusion  2 g Intravenous Once  . pantoprazole (PROTONIX) IV  40 mg Intravenous Q12H  . potassium chloride  10 mEq Intravenous Q1 Hr x 3  . potassium chloride  10 mEq Intravenous Q1 Hr x 2  . DISCONTD: magnesium sulfate infusion  2 g Intravenous Once  . DISCONTD: potassium chloride  10 mEq Intravenous Q1 Hr x 4  . DISCONTD: potassium chloride  10 mEq Intravenous Q1 Hr x 2   Continuous Infusions:   . 0.45 %  NaCl with KCl 20 mEq / L 50 mL/hr at 05/20/11 1400  . sodium chloride    . TPN (CLINIMIX) +/- additives 40 mL/hr at 05/20/11 0700   And  . fat emulsion 250 mL (05/20/11 0700)  . TPN (CLINIMIX) +/- additives 60 mL/hr at 05/20/11 1730   And  . fat emulsion 250 mL (05/20/11 1730)  . DISCONTD: 0.45 % NaCl with KCl 20 mEq / L 50 mL/hr at 05/21/11 0608   PRN Meds:.acetaminophen, LORazepam, morphine injection, sodium chloride, DISCONTD: fentaNYL  Assessment/Plan: 1-Sepsis: secondary to perforated viscus. Now resolved and on her last day of antibiotics. No fever, no diarrhea, normal WBC's. 2-DIABETES MELLITUS, TYPE II: Continue SSI. Pharmacy to adjust insulin inside TNA if CBG's too high. 3-HYPERTENSION: stable, currently no receiving any medications, since patient still NPO. Once  she is able to take po's will resume home antihypertensive regimen. For now PRN hydralazine if needed. 4-Anemia: Stable. Will follow Hgb, no signs of acute bleeding.  5-Hypernatremia:Improved with 0.45% sodium. Continue IVF. 6-Abdominal pain, acute: secondary to recent perforation and surgery. No well controlled with fentanyl; will start morphine. 7-Metabolic acidosis: Resolved. 8-Esophagitis, erosive: will continue PPI. 9-Acute respiratory failure: resolved. 10-anxiety: will use PRN ativan. 11-pretein/calori malnutrition: acute due to NPO status. Continue TNA per pharmacy.    LOS: 15 days   Icker Swigert 05/21/2011, 10:09 AM

## 2011-05-21 NOTE — Progress Notes (Signed)
Got patient up to chair. It is 1535 and pt is still in chair. Debbora Presto 05/21/2011 3:35 PM

## 2011-05-21 NOTE — Progress Notes (Signed)
Gave patient morphine 3mg  this morning and pt did not get much relief from it. I then gave her ativan and it seemed to help the patient more than the morphine. I just administered more morphine and will monitor how it effects the patient. [name] 3:30 PM 05/21/2011

## 2011-05-21 NOTE — Progress Notes (Signed)
4:24 PM Changed dressings on abdomen and there was scant drainage. Before changing dressing, we moved her back to the bed. I would suggest using a lift next time she gets to the chair. She was totally dependent on the two people moving her. Patient cannot turn well by herself in the bed. Debbora Presto 05/21/2011

## 2011-05-21 NOTE — Progress Notes (Signed)
Patient ID: [name], female   DOB: 11-16-1931, 77 y.o.   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 Remains non tender with no peritoneal signs  Labs: CBC  Basename 05/21/11 0530 05/20/11 0610  WBC 7.9 9.2  HGB 8.3* 8.7*  HCT 25.6* 27.1*  PLT 318 369   BMET  Basename 05/21/11 0530 05/20/11 0610  NA 136 137  K 3.1* 3.4*  CL 103 104  CO2 23 24  GLUCOSE 148* 143*  BUN 10 11  CREATININE 0.47* 0.50  CALCIUM 7.9* 8.2*   LFT  Basename 05/21/11 0530  PROT 5.6*  ALBUMIN 1.8*  AST 12  ALT 7  ALKPHOS 79  BILITOT 0.2*  BILIDIR --  IBILI --  LIPASE --   PT/INR No results found for this basename: LABPROT:2,INR:2 in the last 72 hours ABG No results found for this basename: PHART:2,PCO2:2,PO2:2,HCO3:2 in the last 72 hours  Studies/Results: No results found.  Assessment: Principal Problem:  *Sepsis Active Problems:  DIABETES MELLITUS, TYPE II  HYPERTENSION  Anemia  Hyponatremia  Abdominal pain, acute  Metabolic acidosis  Esophagitis, erosive  Acute respiratory failure  s/p Procedure(s): EXPLORATORY LAPAROTOMY GASTROSTOMY GASTRECTOMY CHOLECYSTECTOMY Plan: Continue nutrition and current wound care  LOS: 15 days    Shevonne Wolf A 05/21/2011

## 2011-05-22 LAB — CBC
Hemoglobin: 8.2 g/dL — ABNORMAL LOW (ref 12.0–15.0)
MCHC: 31.9 g/dL (ref 30.0–36.0)

## 2011-05-22 LAB — GLUCOSE, CAPILLARY
Glucose-Capillary: 172 mg/dL — ABNORMAL HIGH (ref 70–99)
Glucose-Capillary: 179 mg/dL — ABNORMAL HIGH (ref 70–99)
Glucose-Capillary: 146 mg/dL — ABNORMAL HIGH (ref 70–99)
Glucose-Capillary: 123 mg/dL — ABNORMAL HIGH (ref 70–99)

## 2011-05-22 MED ORDER — TRACE MINERALS CR-CU-MN-SE-ZN 10-1000-500-60 MCG/ML IV SOLN
INTRAVENOUS | Status: AC
  Administered 2011-05-22: 17:00:00 via INTRAVENOUS
  Filled 2011-05-22: qty 2000

## 2011-05-22 MED ORDER — INSULIN ASPART 100 UNIT/ML ~~LOC~~ SOLN
0.0000 [IU] | SUBCUTANEOUS | Status: AC
  Administered 2011-05-22: 2 [IU] via SUBCUTANEOUS
  Administered 2011-05-22 (×2): 3 [IU] via SUBCUTANEOUS
  Administered 2011-05-22 – 2011-05-23 (×6): 2 [IU] via SUBCUTANEOUS
  Administered 2011-05-24 (×2): 3 [IU] via SUBCUTANEOUS
  Administered 2011-05-24: 2 [IU] via SUBCUTANEOUS
  Administered 2011-05-24: 3 [IU] via SUBCUTANEOUS
  Administered 2011-05-24: 2 [IU] via SUBCUTANEOUS
  Administered 2011-05-24: 3 [IU] via SUBCUTANEOUS
  Administered 2011-05-25: 2 [IU] via SUBCUTANEOUS
  Administered 2011-05-25 (×2): 3 [IU] via SUBCUTANEOUS
  Administered 2011-05-25 (×3): 2 [IU] via SUBCUTANEOUS
  Administered 2011-05-26: 3 [IU] via SUBCUTANEOUS
  Administered 2011-05-26 (×2): 2 [IU] via SUBCUTANEOUS
  Administered 2011-05-26 (×2): 3 [IU] via SUBCUTANEOUS
  Administered 2011-05-27 (×4): 2 [IU] via SUBCUTANEOUS
  Administered 2011-05-28: 3 [IU] via SUBCUTANEOUS
  Administered 2011-05-28 (×3): 2 [IU] via SUBCUTANEOUS
  Administered 2011-05-29: 3 [IU] via SUBCUTANEOUS
  Administered 2011-05-29 (×2): 2 [IU] via SUBCUTANEOUS
  Administered 2011-05-29: 3 [IU] via SUBCUTANEOUS
  Administered 2011-05-29: 2 [IU] via SUBCUTANEOUS
  Filled 2011-05-22: qty 3

## 2011-05-22 MED ORDER — FAT EMULSION 20 % IV EMUL
250.0000 mL | INTRAVENOUS | Status: AC
  Administered 2011-05-22: 250 mL via INTRAVENOUS
  Filled 2011-05-22: qty 250

## 2011-05-22 NOTE — Progress Notes (Signed)
Nutrition Follow-up  Dx: sepsis Diet: NPO- ice chips and sips, bowel rest  Height: 167.6 cm Weight: 76.9 kg Weight change: 9 lb 8 oz (4.309 kg)  Body mass index is 27.36 kg/(m^2).  Scheduled Meds:   . heparin subcutaneous  5,000 Units Subcutaneous Q8H  . imipenem-cilastatin  500 mg Intravenous Q8H  . insulin aspart  0-15 Units Subcutaneous Q4H  . lip balm  1 application Topical BID  . pantoprazole (PROTONIX) IV  40 mg Intravenous Q12H  . potassium chloride  10 mEq Intravenous Q1 Hr x 3  . DISCONTD: insulin aspart  0-4 Units Subcutaneous Q4H   Continuous Infusions:   . TPN (CLINIMIX) +/- additives 60 mL/hr at 05/21/11 1802   And  . fat emulsion 250 mL (05/21/11 1802)  . TPN (CLINIMIX) +/- additives     And  . fat emulsion    . sodium chloride 0.45 % with kcl 50 mL/hr at 05/22/11 0912  . DISCONTD: sodium chloride     PRN Meds:.acetaminophen, LORazepam, morphine injection, sodium chloride  CMP     Component Value Date/Time   NA 136 05/21/2011 0530   K 3.1* 05/21/2011 0530   CL 103 05/21/2011 0530   CO2 23 05/21/2011 0530   GLUCOSE 148* 05/21/2011 0530   BUN 10 05/21/2011 0530   CREATININE 0.47* 05/21/2011 0530   CALCIUM 7.9* 05/21/2011 0530   PROT 5.6* 05/21/2011 0530   ALBUMIN 1.8* 05/21/2011 0530   AST 12 05/21/2011 0530   ALT 7 05/21/2011 0530   ALKPHOS 79 05/21/2011 0530   BILITOT 0.2* 05/21/2011 0530   GFRNONAA >90 05/21/2011 0530   GFRAA >90 05/21/2011 0530   Magnesium 1.9 Phosphorus 3.8 PAB 7.6  CBG (last 3)   Basename 05/22/11 1041 05/22/11 0721 05/22/11 0435  GLUCAP 169* 146* 179*   Per progress notes, poor TF tolerance and Jtube displacment.  ReceivingTPN, goal rate of 80 mL/hr.  Current TPN regimen: Clinimix 5/15 at 80 mL/hr with 20% lipids at 5 mL/hr, providing a total of 1603 kcal  and 96 gm protein .  New Nutrition Needs based on current weight (pt does not currently qualify for ASPEN permissive underfeeding guidelines): Energy: 1785-  2000 kcal Protein: 95- 110 gm Fluid: 2-2.3 L  Nutr dx: Inadequate oral intake- ongoing  Goal: Meet >90% estimated nutritional needs via nutrition support. Intervention: Pharmacy to dose TPN- note change in nutritional needs. Monitor: TPN weaning, wt trends, labs, nutr adequacy

## 2011-05-22 NOTE — Progress Notes (Signed)
CSW received patient from PCCM. CSW reviewed chart which stated patient is interested in [place]. CSW called step-dtr to introduce myself and verify family's interest in [facility]. CSW called SNF who stated patient can admit when dc from hospital. SNF stated they are working on insurance authorization but insurance office is closed today. CSW will continue to follow to assist with dc needs. [place] [place] [phone]

## 2011-05-22 NOTE — Assessment & Plan Note (Signed)
Controlled when last checked, reduction in meduication as numbers have normalized, likely due to severe recent illness, which included intesdtinal surgery, will monitor  closely

## 2011-05-22 NOTE — Progress Notes (Signed)
Subjective: More calm, no CP, no SOB and currently reporting less tender abdominal pain. No fever.  Objective: Vital signs in last 24 hours: Temp:  [98 F (36.7 C)-99.5 F (37.5 C)] 99.5 F (37.5 C) (11/23 0521) Pulse Rate:  [99-118] 118  (11/23 0521) Resp:  [18] 18  (11/23 0521) BP: (96-147)/(64-91) 147/91 mmHg (11/23 0521) SpO2:  [94 %-98 %] 98 % (11/23 0521) Weight:  [76.885 kg (169 lb 8 oz)] 169 lb 8 oz (76.885 kg) (11/23 0100) Weight change: 4.309 kg (9 lb 8 oz)    Intake/Output from previous day: 11/22 0701 - 11/23 0700 In: 1347.3 [I.V.:496.7; IV Piggyback:300; TPN:550.7] Out: 1860 [Urine:1600; Drains:260] Total I/O In: 760.8 [I.V.:760.8] Out: 150 [Drains:150]   Physical Exam: General: Alert, awake, oriented x3, in mild distress 2/2 pain; also anxious. HEENT: No bruits, no goiter. Heart: Mild tachycardia, without murmurs, rubs, gallops. Lungs: Clear to auscultation bilaterally. Abdomen: Soft, less tender, nondistended, quiet BS. Colostomy bag in place, G tube also in place; incision clean with scant drainage. Extremities: No clubbing cyanosis or edema with positive pedal pulses. Neuro: Grossly intact, nonfocal.  Lab Results: Basic Metabolic Panel:  Basename 05/21/11 0530 05/20/11 0610  NA 136 137  K 3.1* 3.4*  CL 103 104  CO2 23 24  GLUCOSE 148* 143*  BUN 10 11  CREATININE 0.47* 0.50  CALCIUM 7.9* 8.2*  MG 1.9 1.6  PHOS 3.8 3.4   Liver Function Tests:  Basename 05/21/11 0530 05/20/11 0610  AST 12 13  ALT 7 8  ALKPHOS 79 87  BILITOT 0.2* 0.3  PROT 5.6* 5.7*  ALBUMIN 1.8* 1.7*   CBC:  Basename 05/22/11 0700 05/21/11 0530 05/20/11 0610  WBC 7.6 7.9 --  NEUTROABS -- 5.9 7.1  HGB 8.2* 8.3* --  HCT 25.7* 25.6* --  MCV 81.1 80.8 --  PLT 338 318 --   CBG:  Basename 05/22/11 0721 05/22/11 0435 05/22/11 0125 05/21/11 2012 05/21/11 1609 05/21/11 1153  GLUCAP 146* 179* 172* 164* 162* 210*   Fasting Lipid Panel:  Basename 05/20/11 0610  CHOL 90    HDL --  LDLCALC --  TRIG 161*  CHOLHDL --  LDLDIRECT --    Recent Results (from the past 240 hour(s))  CULTURE, RESPIRATORY     Status: Normal   Collection Time   05/13/11  6:02 AM      Component Value Range Status Comment   Specimen Description ENDOTRACHEAL   Final    Special Requests NONE   Final    Gram Stain     Final    Value: FEW WBC PRESENT, PREDOMINANTLY PMN     RARE SQUAMOUS EPITHELIAL CELLS PRESENT     NO ORGANISMS SEEN   Culture NO GROWTH 2 DAYS   Final    Report Status 05/15/2011 FINAL   Final     Studies/Results: No results found.  Medications: Scheduled Meds:    . heparin subcutaneous  5,000 Units Subcutaneous Q8H  . imipenem-cilastatin  500 mg Intravenous Q8H  . insulin aspart  0-4 Units Subcutaneous Q4H  . lip balm  1 application Topical BID  . pantoprazole (PROTONIX) IV  40 mg Intravenous Q12H  . potassium chloride  10 mEq Intravenous Q1 Hr x 3   Continuous Infusions:    . TPN (CLINIMIX) +/- additives 60 mL/hr at 05/21/11 1802   And  . fat emulsion 250 mL (05/21/11 1802)  . sodium chloride 0.45 % with kcl 50 mL/hr at 05/22/11 0912  . DISCONTD: sodium  chloride    . DISCONTD: fat emulsion 250 mL (05/20/11 1730)  . DISCONTD: fat emulsion    . DISCONTD: TPN (CLINIMIX) +/- additives    . DISCONTD: TPN (CLINIMIX) +/- additives 60 mL/hr at 05/20/11 1730   PRN Meds:.acetaminophen, LORazepam, morphine injection, sodium chloride  Assessment/Plan: 1-Sepsis: secondary to perforated viscus.Finished antibiotics yesterday; no fever and normal WBC's. 2-DIABETES MELLITUS, TYPE II: Continue SSI. Pharmacy to adjust insulin inside TNA. 3-HYPERTENSION: continue PRN hydralazine if needed. 4-Anemia: Stable. Hgb stable, no signs of acute bleeding. Continue monitoring.  5-Hypernatremia: Continue current IVF. 6-Abdominal pain, acute: secondary to recent bowel perforation and s/p surgery. Much better with current morphine regimen. Will follow surgery rec's and  continue NPO status for now. 7-Esophagitis, erosive: Continue PPI. 8-Acute respiratory failure: resolved. 9-anxiety: continue PRN ativan. 10-pretein/calorie malnutrition: Continue TNA per pharmacy.    LOS: 16 days   [name] 05/22/2011, 10:13 AM

## 2011-05-22 NOTE — Progress Notes (Signed)
15 Days Post-Op  Subjective: Pt without c/o. Denies much pain. NO BM or flatus per pt. G-tube still with significant bilious output.  Objective: Vital signs in last 24 hours: Temp:  [98 F (36.7 C)-99.5 F (37.5 C)] 99.5 F (37.5 C) (11/23 0521) Pulse Rate:  [99-118] 118  (11/23 0521) Resp:  [18] 18  (11/23 0521) BP: (96-147)/(64-91) 147/91 mmHg (11/23 0521) SpO2:  [94 %-98 %] 98 % (11/23 0521) Weight:  [169 lb 8 oz (76.885 kg)] 169 lb 8 oz (76.885 kg) (11/23 0100)    Intake/Output this shift: Total I/O In: 760.8 [I.V.:760.8] Out: 150 [Drains:150]  Physical Exam: Abdomen: soft, NT WOund clean G-tube intact Jtube site with controlled drainage into bag JP#1 serous JP#2 still thin brown output, not much  Labs: CBC  Basename 05/22/11 0700 05/21/11 0530  WBC 7.6 7.9  HGB 8.2* 8.3*  HCT 25.7* 25.6*  PLT 338 318   BMET  Basename 05/21/11 0530 05/20/11 0610  NA 136 137  K 3.1* 3.4*  CL 103 104  CO2 23 24  GLUCOSE 148* 143*  BUN 10 11  CREATININE 0.47* 0.50  CALCIUM 7.9* 8.2*   LFT  Basename 05/21/11 0530  PROT 5.6*  ALBUMIN 1.8*  AST 12  ALT 7  ALKPHOS 79  BILITOT 0.2*  BILIDIR --  IBILI --  LIPASE --   PT/INR No results found for this basename: LABPROT:2,INR:2 in the last 72 hours ABG No results found for this basename: PHART:2,PCO2:2,PO2:2,HCO3:2 in the last 72 hours  Studies/Results: No results found.  Assessment: Principal Problem:  *Sepsis Active Problems:  DIABETES MELLITUS, TYPE II  HYPERTENSION  Anemia  Hyponatremia  Abdominal pain, acute  Metabolic acidosis  Esophagitis, erosive  Acute respiratory failure  s/p Procedure(s): EXPLORATORY LAPAROTOMY GASTROSTOMY GASTRECTOMY CHOLECYSTECTOMY Plan: COntinue bowel rest, NPO PCM/TNA Wound care  LOS: 16 days    Jenia Klepper J 05/22/2011

## 2011-05-22 NOTE — Assessment & Plan Note (Signed)
Deteriorated, will adjust medication

## 2011-05-22 NOTE — Progress Notes (Signed)
PARENTERAL NUTRITION CONSULT NOTE - FOLLOW UP  Pharmacy Consult for TPN Indication: Not tolerating TF- Jtube out   Allergies  Allergen Reactions  . Other Swelling    Pinto beans cause swelling and hives  . Penicillins Swelling    Patient Measurements: Height: 5\' 6"  (167.6 cm) Weight: 169 lb 8 oz (76.885 kg) IBW/kg (Calculated) : 59.3  Adjusted Body Weight:   Usual Weight:    Vital Signs: Temp: 99.5 F (37.5 C) (11/23 0521) Temp src: Oral (11/23 0521) BP: 147/91 mmHg (11/23 0521) Pulse Rate: 118  (11/23 0521) Intake/Output from previous day: 11/22 0701 - 11/23 0700 In: 1347.3 [I.V.:496.7; IV Piggyback:300; TPN:550.7] Out: 1860 [Urine:1600; Drains:260] Intake/Output from this shift: Total I/O In: 760.8 [I.V.:760.8] Out: 150 [Drains:150]  Labs:  [facility] 05/22/11 0700 05/21/11 0530 05/20/11 0610  WBC 7.6 7.9 9.2  HGB 8.2* 8.3* 8.7*  HCT 25.7* 25.6* 27.1*  PLT 338 318 369  APTT -- -- --  INR -- -- --     Basename 05/21/11 0530 05/20/11 0610  NA 136 137  K 3.1* 3.4*  CL 103 104  CO2 23 24  GLUCOSE 148* 143*  BUN 10 11  CREATININE 0.47* 0.50  LABCREA -- --  CREAT24HRUR -- --  CALCIUM 7.9* 8.2*  MG 1.9 1.6  PHOS 3.8 3.4  PROT 5.6* 5.7*  ALBUMIN 1.8* 1.7*  AST 12 13  ALT 7 8  ALKPHOS 79 87  BILITOT 0.2* 0.3  BILIDIR -- --  IBILI -- --  PREALBUMIN -- 7.6*  CHOLHDL -- --  CHOL -- 90   Estimated Creatinine Clearance: 61.6 ml/min (by C-G formula based on Cr of 0.47).    Basename 05/22/11 0721 05/22/11 0435 05/22/11 0125  GLUCAP 146* 179* 172*    Medications:  Scheduled:    . heparin subcutaneous  5,000 Units Subcutaneous Q8H  . imipenem-cilastatin  500 mg Intravenous Q8H  . insulin aspart  0-4 Units Subcutaneous Q4H  . lip balm  1 application Topical BID  . pantoprazole (PROTONIX) IV  40 mg Intravenous Q12H  . potassium chloride  10 mEq Intravenous Q1 Hr x 3   Infusions:    . TPN (CLINIMIX) +/- additives 60 mL/hr at 05/21/11 1802   And    . fat emulsion 250 mL (05/21/11 1802)  . sodium chloride 0.45 % with kcl 50 mL/hr at 05/22/11 0912  . DISCONTD: sodium chloride    . DISCONTD: fat emulsion 250 mL (05/20/11 1730)  . DISCONTD: fat emulsion    . DISCONTD: TPN (CLINIMIX) +/- additives    . DISCONTD: TPN (CLINIMIX) +/- additives 60 mL/hr at 05/20/11 1730    Insulin Requirements in the past 24 hours:  19 units SSI  Current Nutrition:  TPN at 13ml/hr (goal 37ml/hr)   Assessment:  77 y/o previously on TPN (For erosive esophagitis with perforated pyloric ulcer s/p lap 11/7 with gastrostomy, gastrectomy and cholecystectomy) started 05/12/11. Resumed TPN now for increased J-tube output and and J-tube displacement. Prealbumin only 7.6. Will cut lipids in TPN which will allow me to increase TPN rate and provide more protein without overfeeding lipid calories. Will increase TPN to goal 60ml/hr today  Electrolytes: None new today.  Glucose: CBGs 162-210. Will increase insulin in TPN today especially with increase in rate  Nutritional Goals:  1200-1500 kCal, >115 grams of protein per day  Plan:  Increase insulin in TPN Increase TPN to 49ml/hr  Change SSI to Glycemic Control protocol Misty Stanley Stillinger 05/22/2011,10:20 AM

## 2011-05-22 NOTE — Progress Notes (Signed)
Physical Therapy Treatment [name] MRN: [id number] DOB: 1931-08-28 Today's Date: 05/22/2011  PT Assessment/Plan  PT - Assessment/Plan Comments on Treatment Session: pt with decreased arousal limiting participation today.  Still agreeable for mobility, however limited by lethargy.   PT Plan: Discharge plan remains appropriate PT Frequency: Min 3X/week Follow Up Recommendations: Skilled nursing facility Equipment Recommended: Defer to next venue PT Goals  Acute Rehab PT Goals PT Goal: Supine/Side to Sit - Progress: Progressing toward goal PT Goal: Sit to Supine/Side - Progress: Progressing toward goal PT Transfer Goal: Sit to Stand/Stand to Sit - Progress: Not met PT Transfer Goal: Bed to Chair/Chair to Bed - Progress: Not met PT Goal: Ambulate - Progress: Not met  PT Treatment Precautions/Restrictions  Precautions Precautions: Fall Precaution Comments: Secondary to patient restless Restrictions Weight Bearing Restrictions: No Mobility (including Balance) Bed Mobility Bed Mobility: Yes Supine to Sit: 1: +2 Total assist;HOB elevated (Comment degrees);Patient percentage (comment) (HOB~30, pt 60%) Supine to Sit Details (indicate cue type and reason): A needed for LEs and trunk.   Sitting - Scoot to Edge of Bed: 3: Mod assist Sitting - Scoot to Edge of Bed Details (indicate cue type and reason): pt attempts to use UEs to A, however with posterior lean in sitting  Sit to Supine - Right: 1: +2 Total assist;Patient percentage (comment) (pt 30%) Sit to Supine - Right Details (indicate cue type and reason): cues for movment of trunk, needing A for Bil LEs Transfers Transfers: No Ambulation/Gait Ambulation/Gait: No  Static Sitting Balance Static Sitting - Balance Support: Bilateral upper extremity supported;Feet supported Static Sitting - Level of Assistance: 4: Min assist Static Sitting - Comment/# of Minutes: fluctuated between SBA and MinA.  pt EOB ~3mins.    Exercise    End of Session PT - End of Session Activity Tolerance: Patient limited by fatigue Patient left: in bed;with call bell in reach Nurse Communication:  (pt needs new SCDs) General Behavior During Session: Lethargic Cognition: [facility] for tasks performed  [name], [place] [phone] 05/22/2011, 12:17 PM

## 2011-05-22 NOTE — Assessment & Plan Note (Signed)
Controlled, no change in medication  

## 2011-05-22 NOTE — Progress Notes (Signed)
  Subjective:    Patient ID: [name], female    DOB: 10-29-1931, 77 y.o.   MRN: [id number]  HPI Pt in today c/o poor sleep, poor appetite  And  Fatigue.also she continues to c/o headache. She has actually had a scan of her head in the past 4 months, and she has had ED visits on average twice weekly for the past several weeks after being discharged from the [facility] center with the same complaints.  Again I discuss with the pt the need to stay in a home as she clearly is incapable of living independently. She agrees at the visit on a social service consult, and within 1 day calls back to state her unwillingness to leave her home. Her only relative is a niece and she has provided me with hjer contact info    Review of Systems See HPI Denies recent fever or chills. Denies sinus pressure, nasal congestion, ear pain or sore throat. Denies chest congestion, productive cough or wheezing. Denies chest pains, palpitations and leg swelling C/o urinary incontinence, but denies dysuria . Denies joint pain, swelling and limitation in mobility. Denies  seizures, numbness, or tingling. Denies depression, anxiety or insomnia. Denies skin break down or rash.        Objective:   Physical Exam        Assessment & Plan:

## 2011-05-23 LAB — BASIC METABOLIC PANEL
BUN: 10 mg/dL (ref 6–23)
CO2: 25 mEq/L (ref 19–32)
Calcium: 8.4 mg/dL (ref 8.4–10.5)
Creatinine, Ser: 0.45 mg/dL — ABNORMAL LOW (ref 0.50–1.10)
GFR calc non Af Amer: >90 mL/min (ref >90)
Glucose, Bld: 146 mg/dL — ABNORMAL HIGH (ref 70–99)

## 2011-05-23 LAB — CBC
MCH: 25.9 pg — ABNORMAL LOW (ref 26.0–34.0)
MCHC: 31.8 g/dL (ref 30.0–36.0)
MCV: 81.3 fL (ref 78.0–100.0)
Platelets: 292 10*3/uL (ref 150–400)

## 2011-05-23 LAB — GLUCOSE, CAPILLARY
Glucose-Capillary: 124 mg/dL — ABNORMAL HIGH (ref 70–99)
Glucose-Capillary: 139 mg/dL — ABNORMAL HIGH (ref 70–99)
Glucose-Capillary: 152 mg/dL — ABNORMAL HIGH (ref 70–99)

## 2011-05-23 MED ORDER — FAT EMULSION 20 % IV EMUL
250.0000 mL | INTRAVENOUS | Status: AC
  Administered 2011-05-23: 250 mL via INTRAVENOUS
  Filled 2011-05-23: qty 250

## 2011-05-23 MED ORDER — HYDRALAZINE HCL 20 MG/ML IJ SOLN
10.0000 mg | Freq: Three times a day (TID) | INTRAMUSCULAR | Status: DC | PRN
  Filled 2011-05-23: qty 1

## 2011-05-23 MED ORDER — INSULIN REGULAR HUMAN 100 UNIT/ML IJ SOLN
INTRAMUSCULAR | Status: AC
  Administered 2011-05-23: 18:00:00 via INTRAVENOUS
  Filled 2011-05-23: qty 2000

## 2011-05-23 NOTE — Progress Notes (Signed)
PARENTERAL NUTRITION CONSULT NOTE - FOLLOW UP  Pharmacy Consult for TPN Indication: Not tolerating TF- Jtube out   Allergies  Allergen Reactions  . Other Swelling    Pinto beans cause swelling and hives  . Penicillins Swelling    Patient Measurements: Height: 5\' 6"  (167.6 cm) Weight: 175 lb 7.8 oz (79.6 kg) IBW/kg (Calculated) : 59.3  Adjusted Body Weight:   Usual Weight:    Vital Signs: Temp: 98.9 F (37.2 C) (11/24 0544) Temp src: Oral (11/24 0544) BP: 154/82 mmHg (11/24 0544) Pulse Rate: 105  (11/24 0544) Intake/Output from previous day: 11/23 0701 - 11/24 0700 In: 3068.3 [I.V.:1200.8; TPN:1867.5] Out: 3805 [Urine:3450; Drains:205; Stool:150] Intake/Output from this shift:    Labs:  [facility] 05/23/11 0535 05/22/11 0700 05/21/11 0530  WBC 7.8 7.6 7.9  HGB 8.3* 8.2* 8.3*  HCT 26.1* 25.7* 25.6*  PLT 292 338 318  APTT -- -- --  INR -- -- --     Basename 05/23/11 0535 05/21/11 0530  NA 135 136  K 3.8 3.1*  CL 102 103  CO2 25 23  GLUCOSE 146* 148*  BUN 10 10  CREATININE 0.45* 0.47*  LABCREA -- --  CREAT24HRUR -- --  CALCIUM 8.4 7.9*  MG 1.6 1.9  PHOS -- 3.8  PROT -- 5.6*  ALBUMIN -- 1.8*  AST -- 12  ALT -- 7  ALKPHOS -- 79  BILITOT -- 0.2*  BILIDIR -- --  IBILI -- --  PREALBUMIN -- --  CHOLHDL -- --  CHOL -- --   Estimated Creatinine Clearance: 62.7 ml/min (by C-G formula based on Cr of 0.45).    Basename 05/23/11 0353 05/22/11 2348 05/22/11 1949  GLUCAP 138* 141* 123*    Medications:  Scheduled:     . heparin subcutaneous  5,000 Units Subcutaneous Q8H  . insulin aspart  0-15 Units Subcutaneous Q4H  . lip balm  1 application Topical BID  . pantoprazole (PROTONIX) IV  40 mg Intravenous Q12H  . DISCONTD: insulin aspart  0-4 Units Subcutaneous Q4H   Infusions:     . TPN (CLINIMIX) +/- additives 60 mL/hr at 05/21/11 1802   And  . fat emulsion 250 mL (05/21/11 1802)  . TPN (CLINIMIX) +/- additives 80 mL/hr at 05/22/11 1638   And    . fat emulsion 250 mL (05/22/11 1638)  . sodium chloride 0.45 % with kcl 50 mL/hr at 05/22/11 0912    Insulin Requirements in the past 24 hours:  16 units SSI  Current Nutrition:  TPN at 32ml/hr= 1603 kcal, 96g protein  Assessment:  77 y/o previously on TPN (For erosive esophagitis with perforated pyloric ulcer s/p lap 11/7 with gastrostomy, gastrectomy and cholecystectomy) started 05/12/11. Resumed TPN now for increased J-tube output and and J-tube displacement. Prealbumin only 7.6. Small amount of stool 150cc noted! Appreciate RD new goals. Will increase lipids back to 70ml/hr.  Electrolytes: K replaced to 3.8  Glucose: CBGs [phone] before rate change. [phone] after rate change with increased insulin  Nutritional Goals:  NEW: 1785-2000 kcal and 95-110 g protein  Plan:  TPN at 80 ml/hr goal Increase lipids back to 48ml/hr. NPO/bowel rest.  Misty Stanley Stillinger 05/23/2011,7:57 AM

## 2011-05-23 NOTE — Progress Notes (Signed)
Subjective: no CP, no SOB and currently reporting less tender abdominal pain. No fever.  Objective: Vital signs in last 24 hours: Temp:  [98.9 F (37.2 C)-99.7 F (37.6 C)] 99 F (37.2 C) (11/24 1408) Pulse Rate:  [99-115] 111  (11/24 1408) Resp:  [16-20] 18  (11/24 1408) BP: (145-159)/(82-84) 145/83 mmHg (11/24 1408) SpO2:  [96 %-99 %] 99 % (11/24 1408) Weight:  [79.6 kg (175 lb 7.8 oz)] 175 lb 7.8 oz (79.6 kg) (11/24 0516) Weight change: 2.715 kg (5 lb 15.8 oz)    Intake/Output from previous day: 11/23 0701 - 11/24 0700 In: 3068.3 [I.V.:1200.8; TPN:1867.5] Out: 3805 [Urine:3450; Drains:205; Stool:150]     Physical Exam: General: Alert, awake, oriented x3, in mild distress 2/2 pain; also anxious. HEENT: No bruits, no goiter. Heart: Mild tachycardia, without murmurs, rubs, gallops. Lungs: Clear to auscultation bilaterally. Abdomen: Soft, less tender, nondistended, quiet BS. Colostomy bag in place, G tube also in place; incision clean with scant drainage. Extremities: No clubbing cyanosis or edema with positive pedal pulses. Neuro: Grossly intact, nonfocal.  Lab Results: Basic Metabolic Panel:  Basename 05/23/11 0535 05/21/11 0530  NA 135 136  K 3.8 3.1*  CL 102 103  CO2 25 23  GLUCOSE 146* 148*  BUN 10 10  CREATININE 0.45* 0.47*  CALCIUM 8.4 7.9*  MG 1.6 1.9  PHOS -- 3.8   Liver Function Tests:  Basename 05/21/11 0530  AST 12  ALT 7  ALKPHOS 79  BILITOT 0.2*  PROT 5.6*  ALBUMIN 1.8*   CBC:  Basename 05/23/11 0535 05/22/11 0700 05/21/11 0530  WBC 7.8 7.6 --  NEUTROABS -- -- 5.9  HGB 8.3* 8.2* --  HCT 26.1* 25.7* --  MCV 81.3 81.1 --  PLT 292 338 --   CBG:  Basename 05/23/11 1212 05/23/11 0746 05/23/11 0353 05/22/11 2348 05/22/11 1949 05/22/11 1534  GLUCAP 133* 133* 138* 141* 123* 156*   Fasting Lipid Panel: No results found for this basename: CHOL,HDL,LDLCALC,TRIG,CHOLHDL,LDLDIRECT in the last 72 hours  No results found for this or any  previous visit (from the past 240 hour(s)).  Studies/Results: No results found.  Medications: Scheduled Meds:    . heparin subcutaneous  5,000 Units Subcutaneous Q8H  . insulin aspart  0-15 Units Subcutaneous Q4H  . lip balm  1 application Topical BID  . pantoprazole (PROTONIX) IV  40 mg Intravenous Q12H   Continuous Infusions:    . TPN (CLINIMIX) +/- additives 60 mL/hr at 05/21/11 1802   And  . fat emulsion 250 mL (05/21/11 1802)  . TPN (CLINIMIX) +/- additives 80 mL/hr at 05/22/11 1638   And  . fat emulsion 250 mL (05/22/11 1638)  . TPN (CLINIMIX) +/- additives     And  . fat emulsion    . sodium chloride 0.45 % with kcl 50 mL/hr at 05/22/11 0912   PRN Meds:.acetaminophen, LORazepam, morphine injection, sodium chloride  Assessment/Plan: 1-Sepsis: Resolved. 2-DIABETES MELLITUS, TYPE II: CBG's a lot better now. Will continue SSI and per pharmacy to add insulin inside TNA if needed. 3-HYPERTENSION: continue PRN hydralazine if needed. BP stable currently w.o medications. 4-Anemia: Stable. Hgb stable 8.3 today. Continue monitoring.  5-Hypernatremia and hypokalemia: Resolved. Continue current IVF. 6-Abdominal pain, acute: secondary to recent bowel perforation and s/p surgery. Much better with current morphine regimen. Per surgery continue bowel rest and NPO status. 7-Esophagitis, erosive: Continue PPI. 8-anxiety: continue PRN ativan. 9-pretein/calorie malnutrition: Continue TNA per pharmacy.    LOS: 17 days   [name] 05/23/2011, 4:16  PM   

## 2011-05-23 NOTE — Progress Notes (Signed)
16 Days Post-Op  Subjective: Awake and alert, comfortable  Objective: Vital signs in last 24 hours: Temp:  [98.5 F (36.9 C)-99.7 F (37.6 C)] 98.9 F (37.2 C) (11/24 0544) Pulse Rate:  [99-115] 105  (11/24 0544) Resp:  [16-20] 20  (11/24 0544) BP: (136-159)/(80-84) 154/82 mmHg (11/24 0544) SpO2:  [96 %-97 %] 96 % (11/24 0544) Weight:  [175 lb 7.8 oz (79.6 kg)] 175 lb 7.8 oz (79.6 kg) (11/24 0516)    Intake/Output from previous day: 11/23 0701 - 11/24 0700 In: 3068.3 [I.V.:1200.8; TPN:1867.5] Out: 3805 [Urine:3450; Drains:205; Stool:150] Intake/Output this shift:    General appearance: alert and cooperative Resp: clear to auscultation bilaterally GI: soft, non-tender; bowel sounds normal; no masses,  no organomegaly Still with bilous drainage from J-tube site Lab Results:   Basename 05/23/11 0535 05/22/11 0700  WBC 7.8 7.6  HGB 8.3* 8.2*  HCT 26.1* 25.7*  PLT 292 338   BMET  Basename 05/23/11 0535 05/21/11 0530  NA 135 136  K 3.8 3.1*  CL 102 103  CO2 25 23  GLUCOSE 146* 148*  BUN 10 10  CREATININE 0.45* 0.47*  CALCIUM 8.4 7.9*   PT/INR No results found for this basename: LABPROT:2,INR:2 in the last 72 hours ABG No results found for this basename: PHART:2,PCO2:2,PO2:2,HCO3:2 in the last 72 hours  Studies/Results: No results found.  Anti-infectives: Anti-infectives     Start     Dose/Rate Route Frequency Ordered Stop   05/07/11 1400   imipenem-cilastatin (PRIMAXIN) 500 mg in sodium chloride 0.9 % 100 mL IVPB        500 mg 200 mL/hr over 30 Minutes Intravenous 3 times per day 05/07/11 1106 05/21/11 1359   05/07/11 1200   micafungin (MYCAMINE) 100 mg in sodium chloride 0.9 % 100 mL IVPB  Status:  Discontinued     Comments: PER PHARMACY      100 mg 100 mL/hr over 1 Hours Intravenous Every 24 hours 05/07/11 1032 05/19/11 1103   05/07/11 1200   vancomycin (VANCOCIN) 1,250 mg in sodium chloride 0.9 % 250 mL IVPB  Status:  Discontinued        1,250  mg 166.7 mL/hr over 90 Minutes Intravenous Every 24 hours 05/07/11 1106 05/19/11 1103   05/07/11 1000   micafungin (MYCAMINE) 150 mg in sodium chloride 0.9 % 100 mL IVPB  Status:  Discontinued     Comments: PER PHARMACY      150 mg 100 mL/hr over 1 Hours Intravenous Daily 05/07/11 0951 05/07/11 1029   05/07/11 0300   ertapenem (INVANZ) 1 g in sodium chloride 0.9 % 50 mL IVPB        1 g 100 mL/hr over 30 Minutes Intravenous  Once 05/07/11 0300 05/07/11 0348   05/07/11 0130   cefTRIAXone (ROCEPHIN) 1 g in dextrose 5 % 50 mL IVPB        1 g 100 mL/hr over 30 Minutes Intravenous  Once 05/07/11 0122 05/07/11 0200          Assessment/Plan: s/p Procedure(s): EXPLORATORY LAPAROTOMY GASTROSTOMY GASTRECTOMY CHOLECYSTECTOMY  Continue NPO, bowel rest hopefully J-tube site will close and diet can be started soon  LOS: 17 days    [name] A 05/23/2011

## 2011-05-24 LAB — GLUCOSE, CAPILLARY
Glucose-Capillary: 163 mg/dL — ABNORMAL HIGH (ref 70–99)
Glucose-Capillary: 139 mg/dL — ABNORMAL HIGH (ref 70–99)

## 2011-05-24 MED ORDER — INSULIN REGULAR HUMAN 100 UNIT/ML IJ SOLN
INTRAMUSCULAR | Status: AC
  Administered 2011-05-24: 17:00:00 via INTRAVENOUS
  Filled 2011-05-24: qty 2000

## 2011-05-24 MED ORDER — FAT EMULSION 20 % IV EMUL
250.0000 mL | INTRAVENOUS | Status: AC
  Administered 2011-05-24: 250 mL via INTRAVENOUS
  Filled 2011-05-24: qty 250

## 2011-05-24 NOTE — Progress Notes (Signed)
17 Days Post-Op  Subjective: Pt c/o pain. Not much activity. Still having bilious drainage from g-tube  Objective: Vital signs in last 24 hours: Temp:  [98.9 F (37.2 C)-99 F (37.2 C)] 98.9 F (37.2 C) (11/25 0511) Pulse Rate:  [97-111] 103  (11/25 0511) Resp:  [18-19] 18  (11/25 0511) BP: (137-145)/(64-83) 142/83 mmHg (11/25 0511) SpO2:  [93 %-99 %] 93 % (11/25 0511) Weight:  [171 lb 15.3 oz (78 kg)] 171 lb 15.3 oz (78 kg) (11/25 0500)    Intake/Output this shift:    Physical Exam: Abdomen: soft, ND, NT Wound clean, pink JP#1 serous JP#2 still brownish but output trending down.  Labs: CBC  Basename 05/23/11 0535 05/22/11 0700  WBC 7.8 7.6  HGB 8.3* 8.2*  HCT 26.1* 25.7*  PLT 292 338   BMET  Basename 05/23/11 0535  NA 135  K 3.8  CL 102  CO2 25  GLUCOSE 146*  BUN 10  CREATININE 0.45*  CALCIUM 8.4   LFT No results found for this basename: PROT,ALBUMIN,AST,ALT,ALKPHOS,BILITOT,BILIDIR,IBILI,LIPASE in the last 72 hours PT/INR No results found for this basename: LABPROT:2,INR:2 in the last 72 hours ABG No results found for this basename: PHART:2,PCO2:2,PO2:2,HCO3:2 in the last 72 hours  Studies/Results: No results found.  Assessment: Principal Problem:  *Abdominal pain, acute Active Problems:  DIABETES MELLITUS, TYPE II  HYPERTENSION  Anemia  Hyponatremia  Metabolic acidosis  Esophagitis, erosive  Acute respiratory failure  s/p Procedure(s): EXPLORATORY LAPAROTOMY GASTROSTOMY GASTRECTOMY CHOLECYSTECTOMY  Plan: Will begin clamping g-tube for 6 hrs at a time, then gravity drain X 30 minutes, repeat cycle. If tolerates clamping, will check UGI via Gtube tomorrow  LOS: 18 days    [name] J 05/24/2011

## 2011-05-24 NOTE — Progress Notes (Signed)
PARENTERAL NUTRITION CONSULT NOTE - FOLLOW UP  Pharmacy Consult for TPN Indication: Not tolerating TF- Jtube out   Allergies  Allergen Reactions  . Other Swelling    Pinto beans cause swelling and hives  . Penicillins Swelling    Patient Measurements: Height: 5\' 6"  (167.6 cm) Weight: 171 lb 15.3 oz (78 kg) IBW/kg (Calculated) : 59.3  Adjusted Body Weight:   Usual Weight:    Vital Signs: Temp: 98.9 F (37.2 C) (11/25 0511) Temp src: Oral (11/25 0511) BP: 142/83 mmHg (11/25 0511) Pulse Rate: 103  (11/25 0511) Intake/Output from previous day: 11/24 0701 - 11/25 0700 In: 2413.5 [I.V.:600; TPN:1813.5] Out: 40 [Drains:40] Intake/Output from this shift:    Labs:  Basename 05/23/11 0535 05/22/11 0700  WBC 7.8 7.6  HGB 8.3* 8.2*  HCT 26.1* 25.7*  PLT 292 338  APTT -- --  INR -- --     Basename 05/23/11 0535  NA 135  K 3.8  CL 102  CO2 25  GLUCOSE 146*  BUN 10  CREATININE 0.45*  LABCREA --  CREAT24HRUR --  CALCIUM 8.4  MG 1.6  PHOS --  PROT --  ALBUMIN --  AST --  ALT --  ALKPHOS --  BILITOT --  BILIDIR --  IBILI --  PREALBUMIN --  CHOLHDL --  CHOL --   Estimated Creatinine Clearance: 62.1 ml/min (by C-G formula based on Cr of 0.45).    Basename 05/24/11 0809 05/24/11 0421 05/23/11 2350  GLUCAP 163* 163* 152*    Medications:  Scheduled:     . heparin subcutaneous  5,000 Units Subcutaneous Q8H  . insulin aspart  0-15 Units Subcutaneous Q4H  . lip balm  1 application Topical BID  . pantoprazole (PROTONIX) IV  40 mg Intravenous Q12H   Infusions:     . TPN (CLINIMIX) +/- additives 80 mL/hr at 05/22/11 1638   And  . fat emulsion 250 mL (05/22/11 1638)  . TPN (CLINIMIX) +/- additives 80 mL/hr at 05/24/11 0648   And  . fat emulsion 250 mL (05/24/11 0648)  . sodium chloride 0.45 % with kcl 50 mL/hr at 05/23/11 2307    Insulin Requirements in the past 24 hours:  18 units SSI  Current Nutrition:  TPN at 43ml/hr= 1843 kcal, 96g  protein  Assessment:  77 y/o previously on TPN (For erosive esophagitis with perforated pyloric ulcer s/p lap. 11/7 with gastrostomy, gastrectomy and cholecystectomy) started 05/12/11. Resumed TPN now for increased J-tube output and and J-tube displacement. Prealbumin only 7.6. Small amount of stool 150cc noted! Appreciate RD new goals. Awaiting J tube site closure.  Electrolytes: K replaced to 3.8. No new electrolytes today.  Glucose: CBGs  133-163 much better.  Nutritional Goals:  NEW: 1785-2000 kcal and 95-110 g protein  Plan:  TPN at 80 ml/hr goal. No change to formula today. Increase lipids back to 52ml/hr. NPO/bowel rest. Awaiting J-tube site closure  [place], [employer] 05/24/2011,9:29 AM

## 2011-05-24 NOTE — Progress Notes (Signed)
Subjective: No nausea/vomiting, no chest pain or shortness of breath. Patient reports some abdominal pain (according to the patient pain is overall improved)..  Objective: Vital signs in last 24 hours: Temp:  [98.9 F (37.2 C)-99 F (37.2 C)] 98.9 F (37.2 C) (11/25 0511) Pulse Rate:  [97-111] 103  (11/25 0511) Resp:  [18-19] 18  (11/25 0511) BP: (137-145)/(64-83) 142/83 mmHg (11/25 0511) SpO2:  [93 %-99 %] 93 % (11/25 0511) Weight:  [78 kg (171 lb 15.3 oz)] 171 lb 15.3 oz (78 kg) (11/25 0500) Weight change: -1.6 kg (-3 lb 8.4 oz)    Intake/Output from previous day: 11/24 0701 - 11/25 0700 In: 2413.5 [I.V.:600; TPN:1813.5] Out: 40 [Drains:40]    Physical Exam: General: Alert, awake, oriented x3,calm, lying on bed. HEENT: No bruits, no goiter. Heart: Mild tachycardia, without murmurs, rubs, gallops. Lungs: Clear to auscultation bilaterally. Abdomen: Soft, less tender, nondistended, quiet BS. Colostomy bag in place, G tube also in place; incision clean. Extremities: No clubbing cyanosis or edema with positive pedal pulses. Neuro: Grossly intact, nonfocal.  Lab Results: Basic Metabolic Panel:  [facility] 05/23/11 0535  NA 135  K 3.8  CL 102  CO2 25  GLUCOSE 146*  BUN 10  CREATININE 0.45*  CALCIUM 8.4  MG 1.6  PHOS --   CBC:  Basename 05/23/11 0535 05/22/11 0700  WBC 7.8 7.6  NEUTROABS -- --  HGB 8.3* 8.2*  HCT 26.1* 25.7*  MCV 81.3 81.1  PLT 292 338   CBG:  Basename 05/24/11 0809 05/24/11 0421 05/23/11 2350 05/23/11 2003 05/23/11 1641 05/23/11 1212  GLUCAP 163* 163* 152* 139* 124* 133*    Studies/Results: No results found.  Medications: Scheduled Meds:    . heparin subcutaneous  5,000 Units Subcutaneous Q8H  . insulin aspart  0-15 Units Subcutaneous Q4H  . lip balm  1 application Topical BID  . pantoprazole (PROTONIX) IV  40 mg Intravenous Q12H   Continuous Infusions:    . TPN (CLINIMIX) +/- additives 80 mL/hr at 05/22/11 1638   And  . fat  emulsion 250 mL (05/22/11 1638)  . TPN (CLINIMIX) +/- additives 80 mL/hr at 05/24/11 0648   And  . fat emulsion 250 mL (05/24/11 0648)  . TPN (CLINIMIX) +/- additives     And  . fat emulsion    . sodium chloride 0.45 % with kcl 50 mL/hr at 05/23/11 2307   PRN Meds:.acetaminophen, hydrALAZINE, LORazepam, morphine injection, sodium chloride  Assessment/Plan: 1-DIABETES MELLITUS, TYPE II: CBG's a lot better now. Will continue SSI and per pharmacy to add insulin inside TNA if needed. 2-HYPERTENSION: continue PRN hydralazine if needed. BP stable currently w.o medications. 3-Anemia: Stable. Will check CBC in am.  4-Abdominal pain, acute: secondary to recent bowel perforation and s/p surgery. Continue current morphine regimen. Per surgery continue bowel rest and NPO status. 5-Esophagitis, erosive: Continue PPI. 6-anxiety: continue PRN ativan. 7-pretein/calorie malnutrition: Continue TNA per pharmacy.    LOS: 18 days   [name] 05/24/2011, 10:55 AM

## 2011-05-25 ENCOUNTER — Inpatient Hospital Stay (HOSPITAL_COMMUNITY): Payer: Medicare HMO

## 2011-05-25 LAB — CBC
MCHC: 32.2 g/dL (ref 30.0–36.0)
Platelets: 246 10*3/uL (ref 150–400)
RDW: 21.4 % — ABNORMAL HIGH (ref 11.5–15.5)

## 2011-05-25 LAB — CHOLESTEROL, TOTAL: Cholesterol: 98 mg/dL (ref 0–200)

## 2011-05-25 LAB — COMPREHENSIVE METABOLIC PANEL
Albumin: 1.7 g/dL — ABNORMAL LOW (ref 3.5–5.2)
BUN: 13 mg/dL (ref 6–23)
CO2: 24 mEq/L (ref 19–32)
Chloride: 101 mEq/L (ref 96–112)
Creatinine, Ser: 0.45 mg/dL — ABNORMAL LOW (ref 0.50–1.10)
GFR calc non Af Amer: >90 mL/min (ref >90)
Total Bilirubin: 0.2 mg/dL — ABNORMAL LOW (ref 0.3–1.2)

## 2011-05-25 LAB — GLUCOSE, CAPILLARY
Glucose-Capillary: 160 mg/dL — ABNORMAL HIGH (ref 70–99)
Glucose-Capillary: 132 mg/dL — ABNORMAL HIGH (ref 70–99)

## 2011-05-25 LAB — TRIGLYCERIDES: Triglycerides: 126 mg/dL (ref <150)

## 2011-05-25 LAB — DIFFERENTIAL
Basophils Absolute: 0.1 10*3/uL (ref 0.0–0.1)
Eosinophils Absolute: 0.1 10*3/uL (ref 0.0–0.7)
Lymphocytes Relative: 23 % (ref 12–46)
Neutrophils Relative %: 67 % (ref 43–77)

## 2011-05-25 LAB — MAGNESIUM: Magnesium: 1.4 mg/dL — ABNORMAL LOW (ref 1.5–2.5)

## 2011-05-25 LAB — PHOSPHORUS: Phosphorus: 4.3 mg/dL (ref 2.3–4.6)

## 2011-05-25 MED ORDER — IOHEXOL 300 MG/ML  SOLN
120.0000 mL | Freq: Once | INTRAMUSCULAR | Status: AC | PRN
  Administered 2011-05-25: 120 mL

## 2011-05-25 MED ORDER — MAGNESIUM SULFATE 40 MG/ML IJ SOLN
2.0000 g | Freq: Once | INTRAMUSCULAR | Status: AC
  Administered 2011-05-25: 2 g via INTRAVENOUS
  Filled 2011-05-25: qty 50

## 2011-05-25 MED ORDER — FAT EMULSION 20 % IV EMUL
250.0000 mL | INTRAVENOUS | Status: AC
  Administered 2011-05-25: 250 mL via INTRAVENOUS
  Filled 2011-05-25: qty 250

## 2011-05-25 MED ORDER — PHENOL 1.4 % MT LIQD
1.0000 | Freq: Four times a day (QID) | OROMUCOSAL | Status: DC | PRN
  Filled 2011-05-25: qty 177

## 2011-05-25 MED ORDER — M.V.I. ADULT IV INJ
INJECTION | INTRAVENOUS | Status: AC
  Administered 2011-05-25: 18:00:00 via INTRAVENOUS
  Filled 2011-05-25: qty 2000

## 2011-05-25 NOTE — Progress Notes (Signed)
SNF called CSW and requested additional PT notes for insurance authorization. CSW faxed notes to SNF. CSW will continue to follow. Unk [place] [phone]

## 2011-05-25 NOTE — Progress Notes (Signed)
Physical Therapy Treatment [name] MRN: [id number] DOB: Oct 29, 1931 Today's Date: 05/25/2011  PT Assessment/Plan  PT - Assessment/Plan Comments on Treatment Session: pt seems able to do much more than she does and at times says she is trying to A and is actually resisting movement.   PT Plan: Discharge plan remains appropriate PT Frequency: Min 3X/week Follow Up Recommendations: Skilled nursing facility Equipment Recommended: Defer to next venue PT Goals  Acute Rehab PT Goals PT Goal: Supine/Side to Sit - Progress: Progressing toward goal PT Goal: Sit to Supine/Side - Progress: Partly met PT Transfer Goal: Sit to Stand/Stand to Sit - Progress: Not met PT Transfer Goal: Bed to Chair/Chair to Bed - Progress: Not met PT Goal: Ambulate - Progress: Not met  PT Treatment Precautions/Restrictions  Precautions Precautions: Fall Precaution Comments: Secondary to patient restless Restrictions Weight Bearing Restrictions: No Mobility (including Balance) Bed Mobility Bed Mobility: Yes Supine to Sit: 2: Max assist;HOB elevated (Comment degrees) (HOB ~30.  Max encouragement for pt to A.  ) Supine to Sit Details (indicate cue type and reason): pt can A more than she does and at times says she is trying to move and then resists mobility.  Max cues for participation and safe technique.   Sitting - Scoot to Edge of Bed: 4: Min assist Sitting - Scoot to [place] of Bed Details (indicate cue type and reason): A for balance only.  pt able to scoot to EOB, but needs max encouragement for pt to attempt.   Sit to Supine - Right: 3: Mod assist Sit to Supine - Right Details (indicate cue type and reason): pt able to lower trunk to bed without A.  Transfers Transfers: No Ambulation/Gait Ambulation/Gait: No  Static Sitting Balance Static Sitting - Balance Support: Bilateral upper extremity supported;Feet supported Static Sitting - Level of Assistance: 5: Stand by  assistance Static Sitting - Comment/# of Minutes: pt moans and prays while sitting EOB and says she can't keep herself sitting however sat EOB with only SBA.   Exercise    End of Session PT - End of Session Activity Tolerance:  (Self-limiting vs ?cognition) Patient left: in bed;with call bell in reach Nurse Communication: Mobility status for transfers General Behavior During Session: [facility] for tasks performed Cognition:  (?Impaired vs self-limiting)  [name], [place] [phone] 05/25/2011, 3:18 PM

## 2011-05-25 NOTE — Progress Notes (Signed)
Patient's colostomy/J tube site pouch become non-adherent and new pouch applied today.  Stoma site cleansed with antibacterial soap and water.  1cmx1cm open skin irritation around the stoma site.  Will continue to monitor. Nolon Nations

## 2011-05-25 NOTE — Progress Notes (Signed)
Subjective: No nausea/vomiting, no chest pain or shortness of breath. Patient reports some abdominal pain today ( a little more than yesterday). Also complaining of some cough.  Objective: Vital signs in last 24 hours: Temp:  [98 F (36.7 C)-99.6 F (37.6 C)] 99.6 F (37.6 C) (11/26 1347) Pulse Rate:  [109-121] 109  (11/26 1347) Resp:  [18-20] 18  (11/26 1347) BP: (135-149)/(80-82) 135/80 mmHg (11/26 1347) SpO2:  [95 %-97 %] 97 % (11/26 1347) Weight change:     Intake/Output from previous day: 11/25 0701 - 11/26 0700 In: 1620 [I.V.:600; TPN:1020] Out: 3790 [Urine:3675; Drains:15; Stool:100]    Physical Exam: General: Alert, awake, oriented x3,calm, lying on bed. HEENT: No bruits, no goiter. Heart: Mild tachycardia, without murmurs, rubs, gallops. Lungs: Clear to auscultation bilaterally. Abdomen: Soft, less tender, nondistended, quiet BS. Colostomy bag in place with some stool, G tube also in place; incision clean and pink. Extremities: No clubbing cyanosis or edema with positive pedal pulses. Neuro: Grossly intact, nonfocal.  Lab Results: Basic Metabolic Panel:  Basename 05/25/11 0530 05/23/11 0535  NA 133* 135  K 3.9 3.8  CL 101 102  CO2 24 25  GLUCOSE 167* 146*  BUN 13 10  CREATININE 0.45* 0.45*  CALCIUM 8.1* 8.4  MG 1.4* 1.6  PHOS 4.3 --   CBC:  Basename 05/25/11 0530 05/23/11 0535  WBC 5.7 7.8  NEUTROABS 3.8 --  HGB 10.0* 8.3*  HCT 31.1* 26.1*  MCV 81.6 81.3  PLT 246 292   CBG:  Basename 05/25/11 1526 05/25/11 1156 05/25/11 0735 05/25/11 0403 05/25/11 0006 05/24/11 1953  GLUCAP 131* 160* 130* 164* 150* 175*    Studies/Results: Dg Ugi W/water Sol Cm  05/25/2011  *RADIOLOGY REPORT*  Clinical Data:Bilious drainage per G tube status post gastric antrectomy and partial duodenectomy with Billroth II anastomosis for perforated ulcer 05/07/2011.  Evaluate patency of anastomosis.  WATER SOLUBLE UPPER GI SERIES  Technique:  Single-column upper GI series was  performed using water soluble contrast.  Fluoroscopy Time: 1.46 minutes.  Contrast: 120 ml Omnipaque-300 per percutaneous gastrostomy tube.  Comparison: CT 05/18/2011.  Findings: The scout abdominal radiograph demonstrates a percutaneous gastrostomy and jejunostomy.  There is a small amount of residual enteric contrast in the colon.  Water-soluble contrast administered per the G tube demonstrates it to be well positioned with rapid filling of the proximal stomach.  There is rapid emptying into what was presumed to represent the efferent loop.  Small bowel contrast appears to rapidly extend into the right lower quadrant and fill the lumen of the cecum. The segment of the small bowel between the stomach and cecum appears to be significantly shorter than that suggested on CT, implying possible development of a fistulous communication.  No extravasation is identified.  No opacification of the afferent loop was achieved.  There was significant reflux into the esophagus to the level of the cricopharyngeus while turning the patient.  IMPRESSION:  1.  Percutaneous gastrostomy tube appears well positioned. There is no extravasation. 2.  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  As correlated with the recent CT, the efferent loop is not clearly opacified but did appear to be patent on CT.  Follow-up CT may be helpful to better define this anatomy. 3.  Significant gastroesophageal reflux.  Original Report Authenticated By: Gerrianne Scale, M.D.    Medications: Scheduled Meds:    .  heparin subcutaneous  5,000 Units Subcutaneous Q8H  . insulin aspart  0-15 Units Subcutaneous Q4H  . lip balm  1 application Topical BID  . magnesium sulfate IVPB  2 g Intravenous Once  . pantoprazole (PROTONIX) IV  40 mg Intravenous Q12H   Continuous Infusions:    . TPN (CLINIMIX) +/- additives 80 mL/hr at  05/24/11 0648   And  . fat emulsion 250 mL (05/24/11 0648)  . TPN (CLINIMIX) +/- additives 80 mL/hr at 05/24/11 1703   And  . fat emulsion 250 mL (05/24/11 1703)  . TPN (CLINIMIX) +/- additives     And  . fat emulsion    . sodium chloride 0.45 % with kcl 50 mL/hr at 05/24/11 2046   PRN Meds:.acetaminophen, hydrALAZINE, iohexol, LORazepam, morphine injection, phenol, sodium chloride  Assessment/Plan: 1-DIABETES MELLITUS, TYPE II: Will continue SSI. 2-HYPERTENSION: continue PRN hydralazine if needed. BP stable currently w.o medications. 3-Anemia: Stable. Hgb 10.3  4-Abdominal pain, acute: continue PRN pain meds; further care of plan per surgery; for now continue NPO status. 5-Esophagitis, erosive: Continue PPI. 6-anxiety: continue PRN ativan. 7-pretein/calorie malnutrition: Continue TNA per pharmacy. 8-Low Magnesium: to be repleted per pharmacy.    LOS: 19 days   [name] 05/25/2011, 3:45 PM

## 2011-05-25 NOTE — Progress Notes (Addendum)
18 Days Post-Op  Subjective: Pt ok. C/o more pain today after UGI. No N/V with G-tube clamping, seemed to tolerate it well.  Objective: Vital signs in last 24 hours: Temp:  [98 [name] (36.7 C)-99.2 [name] (37.3 C)] 99.2 [name] (37.3 C) (11/26 0500) Pulse Rate:  [110-121] 112  (11/26 0500) Resp:  [18-20] 20  (11/26 0500) BP: (132-149)/(79-82) 138/82 mmHg (11/26 0500) SpO2:  [95 %-96 %] 96 % (11/26 0500)    Intake/Output this shift:    Physical Exam: BP 138/82  Pulse 112  Temp(Src) 99.2 [name] (37.3 C) (Oral)  Resp 20  Ht 5\' 6"  (1.676 m)  Wt 171 lb 15.3 oz (78 kg)  BMI 27.75 kg/m2  SpO2 96% Abdomen: soft, ND. Wound clean, pink, granulating G-tube still with bilioius output when on suction J-tube site clean, still putting out bilious past JP#1 intact-serous JP#3 intact-still brown but thinner and less  Labs: CBC  Basename 05/25/11 0530 05/23/11 0535  WBC 5.7 7.8  HGB 10.0* 8.3*  HCT 31.1* 26.1*  PLT 246 292   BMET  Basename 05/25/11 0530 05/23/11 0535  NA 133* 135  K 3.9 3.8  CL 101 102  CO2 24 25  GLUCOSE 167* 146*  BUN 13 10  CREATININE 0.45* 0.45*  CALCIUM 8.1* 8.4   LFT  Basename 05/25/11 0530  PROT 6.0  ALBUMIN 1.7*  AST 13  ALT 6  ALKPHOS 98  BILITOT 0.2*  BILIDIR --  IBILI --  LIPASE --   PT/INR No results found for this basename: LABPROT:2,INR:2 in the last 72 hours ABG No results found for this basename: PHART:2,PCO2:2,PO2:2,HCO3:2 in the last 72 hours  Studies/Results: Dg Ugi W/water Sol Cm  05/25/2011  *RADIOLOGY REPORT*  Clinical Data:Bilious drainage per G tube status post gastric antrectomy and partial duodenectomy with Billroth II anastomosis for perforated ulcer 05/07/2011.  Evaluate patency of anastomosis.  WATER SOLUBLE UPPER GI SERIES  Technique:  Single-column upper GI series was performed using water soluble contrast.  Fluoroscopy Time: 1.46 minutes.  Contrast: 120 ml Omnipaque-300 per percutaneous gastrostomy tube.  Comparison: CT  05/18/2011.  Findings: The scout abdominal radiograph demonstrates a percutaneous gastrostomy and jejunostomy.  There is a small amount of residual enteric contrast in the colon.  Water-soluble contrast administered per the G tube demonstrates it to be well positioned with rapid filling of the proximal stomach.  There is rapid emptying into what was presumed to represent the efferent loop.  Small bowel contrast appears to rapidly extend into the right lower quadrant and fill the lumen of the cecum. The segment of the small bowel between the stomach and cecum appears to be significantly shorter than that suggested on CT, implying possible development of a fistulous communication.  No extravasation is identified.  No opacification of the afferent loop was achieved.  There was significant reflux into the esophagus to the level of the cricopharyngeus while turning the patient.  IMPRESSION:  1.  Percutaneous gastrostomy tube appears well positioned. There is no extravasation. 2.  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  As correlated with the recent CT, the efferent loop is not clearly opacified but did appear to be patent on CT.  Follow-up CT may be helpful to better define this anatomy. 3.  Significant gastroesophageal reflux.  Original Report Authenticated By: Gerrianne Scale, M.D.    Assessment: Principal Problem:  *Abdominal pain, acute  Active Problems:  DIABETES MELLITUS, TYPE II  HYPERTENSION  Anemia  Hyponatremia  Metabolic acidosis  Esophagitis, erosive  Acute respiratory failure  s/p Procedure(s): EXPLORATORY LAPAROTOMY, GASTROSTOMY, GASTRECTOMY - BII, CHOLECYSTECTOMY on 05/06/2011 by [name].  Plan: Need to review UGI with MD. Continue NPO, TNA G-tube to either gravity or suction  LOS: 19 days    [name],[name] 05/25/2011  Patient "lost" in computer, because  no one signed her in when she was transferred to 5000.  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  There is no "leak" from her bowel.  The cause of this fistula is unknown, but it complicates her management.  She is relatively stable.  Has abdominal pain.  Will need to speak to [name] to get his thoughts on the UGI.  DN  05/25/2011

## 2011-05-25 NOTE — Progress Notes (Signed)
Per MD, patient is not ready to dc to SNF. CSW called SNF who stated they are continuing to follow for insurance authorization. Per SNF, insurance authorization should be ready today. CSW will continue to follow to ensure insurance authorization and SNF are ready when patient is medically stable. Unk [place] [phone]

## 2011-05-25 NOTE — Progress Notes (Addendum)
PARENTERAL NUTRITION CONSULT NOTE - FOLLOW UP  Pharmacy Consult for TPN Indication: Bowel rest/NPO, Not tolerating TF and J-tube out.  s/p gastrectomy, gastrostomy, cholecysectomy for erosive esophagitis and perforated ulcer.    Allergies  Allergen Reactions  . Other Swelling    Pinto beans cause swelling and hives  . Penicillins Swelling    Patient Measurements: Height: 5\' 6"  (167.6 cm) Weight: 171 lb 15.3 oz (78 kg) IBW/kg (Calculated) : 59.3    Vital Signs: Temp: 99.2 F (37.3 C) (11/26 0500) Temp src: Oral (11/26 0500) BP: 138/82 mmHg (11/26 0500) Pulse Rate: 112  (11/26 0500) Intake/Output from previous day: 11/25 0701 - 11/26 0700 In: 1620 [I.V.:600; TPN:1020] Out: 3790 [Urine:3675; Drains:15; Stool:100]  Labs:  [facility] 05/25/11 0530 05/23/11 0535  WBC 5.7 7.8  HGB 10.0* 8.3*  HCT 31.1* 26.1*  PLT 246 292  APTT -- --  INR -- --     [facility] 05/25/11 0530 05/23/11 0535  NA 133* 135  K 3.9 3.8  CL 101 102  CO2 24 25  GLUCOSE 167* 146*  BUN 13 10  CREATININE 0.45* 0.45*  LABCREA -- --  CREAT24HRUR -- --  CALCIUM 8.1* 8.4  MG 1.4* 1.6  PHOS 4.3 --  PROT 6.0 --  ALBUMIN 1.7* --  AST 13 --  ALT 6 --  ALKPHOS 98 --  BILITOT 0.2* --  BILIDIR -- --  IBILI -- --  PREALBUMIN -- --  CHOLHDL -- --  CHOL 98 --   Estimated Creatinine Clearance: 62.1 ml/min (by C-G formula based on Cr of 0.45).    Basename 05/25/11 0735 05/25/11 0403 05/25/11 0006  GLUCAP 130* 164* 150*    Medications:  Infusions:    . TPN (CLINIMIX) +/- additives 80 mL/hr at 05/24/11 0648   And  . fat emulsion 250 mL (05/24/11 0648)  . TPN (CLINIMIX) +/- additives 80 mL/hr at 05/24/11 1703   And  . fat emulsion 250 mL (05/24/11 1703)  . sodium chloride 0.45 % with kcl 50 mL/hr at 05/24/11 2046    Insulin Requirements in the past 24 hours:  17 units SSI  Current Nutrition:  TPN at 65ml/hr= 1843 kcal, 96g protein  Assessment: 77 y/o previously on TPN (For erosive  esophagitis with perforated pyloric ulcer s/p lap. 11/7 with gastrostomy, gastrectomy and cholecystectomy) started 05/12/11. Resumed TPN now for increased J-tube output and and J-tube displacement. Awaiting J tube site closure.  Electrolytes: Na slightly low, Mg = 1.4  Nutritional Goals:  1785-2000 kcal and 95-110 g protein  Plan:  1. TPN meeting calculated needs.  Tolerating TPN except for mildly elevated CBGs.  Continue same TPN 2. Replace Magnessium 3. Increase insulin in TPN to 40 units 4. Due to national backorder, Lipids will be given MWF  Suzette Battiest, Tad Moore 05/25/2011,9:25 AM

## 2011-05-26 ENCOUNTER — Ambulatory Visit: Payer: Medicare HMO | Admitting: Family Medicine

## 2011-05-26 LAB — GLUCOSE, CAPILLARY: Glucose-Capillary: 98 mg/dL (ref 70–99)

## 2011-05-26 MED ORDER — INSULIN REGULAR HUMAN 100 UNIT/ML IJ SOLN
INTRAVENOUS | Status: AC
  Administered 2011-05-26: 18:00:00 via INTRAVENOUS
  Filled 2011-05-26: qty 2000

## 2011-05-26 NOTE — Progress Notes (Signed)
CSW spoke with SNF who stated that insurance called and stated they refused SNF and family could appeal if they desired. Insurance will send appeal information in mail to patient and family. SNF stated that they would see if they could accept patient through her Medicaid benefits. CSW will continue to follow. Unk Lightning [phone]

## 2011-05-26 NOTE — Progress Notes (Signed)
Subjective: No nausea/vomiting, no chest pain or shortness of breath. Patient continued complaining of abdominal pain.   Objective: Vital signs in last 24 hours: Temp:  [99.5 F (37.5 C)-99.9 F (37.7 C)] 99.9 F (37.7 C) (11/27 0527) Pulse Rate:  [76-111] 111  (11/27 0527) Resp:  [16-20] 20  (11/27 0527) BP: (128-135)/(67-80) 135/78 mmHg (11/27 0527) SpO2:  [97 %] 97 % (11/27 0527) Weight change:     Intake/Output from previous day: 11/26 0701 - 11/27 0700 In: 4193.1 [I.V.:1213.1; TPN:2830] Out: 2595 [Urine:2300; Drains:245; Stool:50]    Physical Exam: General: Alert, awake, oriented x3,calm, lying on bed. HEENT: No bruits, no goiter. Heart: Mild tachycardia, without murmurs, rubs, gallops. Lungs: Clear to auscultation bilaterally. Abdomen: Soft, less tender, nondistended, quiet BS. Colostomy bag in place with some stool, G tube also in place; incision clean and pink. Extremities: No clubbing cyanosis or edema with positive pedal pulses. Neuro: Grossly intact, nonfocal.  Lab Results: Basic Metabolic Panel:  Basename 05/25/11 0530  NA 133*  K 3.9  CL 101  CO2 24  GLUCOSE 167*  BUN 13  CREATININE 0.45*  CALCIUM 8.1*  MG 1.4*  PHOS 4.3   CBC:  Basename 05/25/11 0530  WBC 5.7  NEUTROABS 3.8  HGB 10.0*  HCT 31.1*  MCV 81.6  PLT 246   CBG:  Basename 05/26/11 1122 05/26/11 0641 05/26/11 0358 05/26/11 0005 05/25/11 1940 05/25/11 1526  GLUCAP 98 162* 139* 170* 132* 131*    Studies/Results: Dg Ugi W/water Sol Cm  05/25/2011  *RADIOLOGY REPORT*  Clinical Data:Bilious drainage per G tube status post gastric antrectomy and partial duodenectomy with Billroth II anastomosis for perforated ulcer 05/07/2011.  Evaluate patency of anastomosis.  WATER SOLUBLE UPPER GI SERIES  Technique:  Single-column upper GI series was performed using water soluble contrast.  Fluoroscopy Time: 1.46 minutes.  Contrast: 120 ml Omnipaque-300 per percutaneous gastrostomy tube.  Comparison:  CT 05/18/2011.  Findings: The scout abdominal radiograph demonstrates a percutaneous gastrostomy and jejunostomy.  There is a small amount of residual enteric contrast in the colon.  Water-soluble contrast administered per the G tube demonstrates it to be well positioned with rapid filling of the proximal stomach.  There is rapid emptying into what was presumed to represent the efferent loop.  Small bowel contrast appears to rapidly extend into the right lower quadrant and fill the lumen of the cecum. The segment of the small bowel between the stomach and cecum appears to be significantly shorter than that suggested on CT, implying possible development of a fistulous communication.  No extravasation is identified.  No opacification of the afferent loop was achieved.  There was significant reflux into the esophagus to the level of the cricopharyngeus while turning the patient.  IMPRESSION:  1.  Percutaneous gastrostomy tube appears well positioned. There is no extravasation. 2.  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  As correlated with the recent CT, the efferent loop is not clearly opacified but did appear to be patent on CT.  Follow-up CT may be helpful to better define this anatomy. 3.  Significant gastroesophageal reflux.  Original Report Authenticated By: Gerrianne Scale, M.D.    Medications: Scheduled Meds:    . heparin subcutaneous  5,000 Units Subcutaneous Q8H  . insulin aspart  0-15 Units Subcutaneous Q4H  . lip balm  1 application Topical BID  . magnesium sulfate IVPB  2 g Intravenous Once  . pantoprazole (PROTONIX) IV  40 mg Intravenous Q12H   Continuous Infusions:    . TPN (CLINIMIX) +/- additives 80 mL/hr at 05/24/11 1703   And  . fat emulsion 250 mL (05/24/11 1703)  . TPN (CLINIMIX) +/- additives 80 mL/hr at 05/25/11 1732   And  . fat emulsion 250 mL  (05/25/11 1733)  . sodium chloride 0.45 % with kcl 15 mL/hr at 05/25/11 1731  . TPN (CLINIMIX) +/- additives     PRN Meds:.acetaminophen, hydrALAZINE, LORazepam, morphine injection, phenol, sodium chloride  Assessment/Plan: 1-DIABETES MELLITUS, TYPE II: Continue SSI.Patient on TNA.  2-HYPERTENSION: continue PRN hydralazine if needed. BP stable currently w.o medications.  3-Anemia: Stable. No signs of acute bleeding; continue monitoring Hgb.  4-Abdominal pain, acute: continue PRN pain meds; further care of plan per surgery; for now continue NPO status.  5-Esophagitis, erosive: Continue PPI.  6-anxiety: continue PRN ativan.  7-protein/calorie malnutrition: Continue TNA per pharmacy.  8-Low Magnesium: Repleted and dose adjusted inside patient's TNA.  Dispo: Patient is still multiple ampules, receiving TNA. She has a bed available for whenever she is ready for discharge. Will follow surgery recommendations/inputs on that.    LOS: 20 days   Marlissa Emerick 05/26/2011, 12:40 PM

## 2011-05-26 NOTE — Progress Notes (Signed)
PARENTERAL NUTRITION CONSULT NOTE - FOLLOW UP  Pharmacy Consult for TPN Indication: Bowel rest/NPO, Not tolerating TF and J-tube out. Now evidence of SB fistula. s/p gastrectomy, gastrostomy, cholecysectomy for erosive esophagitis and perforated ulcer.    Allergies  Allergen Reactions  . Other Swelling    Pinto beans cause swelling and hives  . Penicillins Swelling    Patient Measurements: Height: 5\' 6"  (167.6 cm) Weight: 171 lb 15.3 oz (78 kg) IBW/kg (Calculated) : 59.3   Vital Signs: Temp: 99.9 F (37.7 C) (11/27 0527) BP: 135/78 mmHg (11/27 0527) Pulse Rate: 111  (11/27 0527) Intake/Output from previous day: 11/26 0701 - 11/27 0700 In: 4193.1 [I.V.:1213.1; TPN:2830] Out: 2595 [Urine:2300; Drains:245; Stool:50]  Labs:  [facility] 05/25/11 0530  WBC 5.7  HGB 10.0*  HCT 31.1*  PLT 246  APTT --  INR --     Basename 05/25/11 0530  NA 133*  K 3.9  CL 101  CO2 24  GLUCOSE 167*  BUN 13  CREATININE 0.45*  LABCREA --  CREAT24HRUR --  CALCIUM 8.1*  MG 1.4*  PHOS 4.3  PROT 6.0  ALBUMIN 1.7*  AST 13  ALT 6  ALKPHOS 98  BILITOT 0.2*  BILIDIR --  IBILI --  PREALBUMIN 7.0*  CHOLHDL --  CHOL 98   Estimated Creatinine Clearance: 62.1 ml/min (by C-G formula based on Cr of 0.45).    Basename 05/26/11 0641 05/26/11 0358 05/26/11 0005  GLUCAP 162* 139* 170*    Medications:  Scheduled:    . heparin subcutaneous  5,000 Units Subcutaneous Q8H  . insulin aspart  0-15 Units Subcutaneous Q4H  . lip balm  1 application Topical BID  . magnesium sulfate IVPB  2 g Intravenous Once  . pantoprazole (PROTONIX) IV  40 mg Intravenous Q12H   Infusions:    . TPN (CLINIMIX) +/- additives 80 mL/hr at 05/24/11 1703   And  . fat emulsion 250 mL (05/24/11 1703)  . TPN (CLINIMIX) +/- additives 80 mL/hr at 05/25/11 1732   And  . fat emulsion 250 mL (05/25/11 1733)  . sodium chloride 0.45 % with kcl 15 mL/hr at 05/25/11 1731    Insulin Requirements in the past 24 hours:    17 units novolog SSI  Current Nutrition:  TPN at 45ml/hr= 1843 kcal, 96g protein   Assessment:  77 y/o previously on TPN (For erosive esophagitis with perforated pyloric ulcer s/p lap. 11/7 with gastrostomy, gastrectomy and cholecystectomy) started 05/12/11. Resumed TPN now for increased J-tube output and and J-tube displacement. Awaiting J tube site closure. 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 Prealbumin unchanged from previous (=7).  Nutritional Goals:  1785-2000 kcal and 95-110 g protein   Plan:  1. Tolerating TPN except for mildly elevated CBGs. Continue same TPN  2. Increase insulin in TPN to 50 units  3. Due to national backorder, Lipids, MVI, and trace elements will be given MWF  Suzette Battiest, Tad Moore 05/26/2011,8:56 AM

## 2011-05-26 NOTE — Progress Notes (Signed)
CSW spoke with SNF who stated insurance denied patient's SNF stay. CSW contacted PT worker Water quality scientist) and asked her to call insurance to discuss patient needs. PT agreed and called and left message with insurance. Office manager is Bonita Quin at [phone] ext. [phone]. CSW will continue to follow. Unk Lightning [phone]

## 2011-05-26 NOTE — Progress Notes (Signed)
SNF called and stated they will accept patient with Medicaid only. CSW met with patient and explained that [facility] insurance denied coverage but that she could go to SNF with her Medicaid covering. Patient agreeable to this plan. Unk Lightning [phone]

## 2011-05-26 NOTE — Progress Notes (Signed)
19 Days Post-Op  Subjective: C/o chronic abdominal discomfort.No better or worse.  ON TNA, 1st day on floor.  Objective: Vital signs in last 24 hours: Temp:  [99.5 F (37.5 C)-99.9 F (37.7 C)] 99.9 F (37.7 C) (11/27 0527) Pulse Rate:  [76-111] 111  (11/27 0527) Resp:  [16-20] 20  (11/27 0527) BP: (128-135)/(67-80) 135/78 mmHg (11/27 0527) SpO2:  [97 %] 97 % (11/27 0527)    Intake/Output from previous day: 11/26 0701 - 11/27 0700 In: 4193.1 [I.V.:1213.1; TPN:2830] Out: 2595 [Urine:2300; Drains:245; Stool:50] Intake/Output this shift:    General appearance: alert, cooperative, fatigued and chronically uncomfortable Resp: clear to auscultation bilaterally and few rales at bases GI: Open abd wound is clean.  Greenish brown drainage from the gastrostomy tube and Jejunostomy site.  The 2 drains on Right: the top one has a brownish white drainage, the lower one is clear serous..  Abd soft, not distended.  Lab Results:   [facility] 05/25/11 0530  WBC 5.7  HGB 10.0*  HCT 31.1*  PLT 246    BMET  Basename 05/25/11 0530  NA 133*  K 3.9  CL 101  CO2 24  GLUCOSE 167*  BUN 13  CREATININE 0.45*  CALCIUM 8.1*   PT/INR No results found for this basename: LABPROT:2,INR:2 in the last 72 hours   Studies/Results: Dg Ugi W/water Sol Cm  05/25/2011  *RADIOLOGY REPORT*  Clinical Data:Bilious drainage per G tube status post gastric antrectomy and partial duodenectomy with Billroth II anastomosis for perforated ulcer 05/07/2011.  Evaluate patency of anastomosis.  WATER SOLUBLE UPPER GI SERIES  Technique:  Single-column upper GI series was performed using water soluble contrast.  Fluoroscopy Time: 1.46 minutes.  Contrast: 120 ml Omnipaque-300 per percutaneous gastrostomy tube.  Comparison: CT 05/18/2011.  Findings: The scout abdominal radiograph demonstrates a percutaneous gastrostomy and jejunostomy.  There is a small amount of residual enteric contrast in the colon.  Water-soluble  contrast administered per the G tube demonstrates it to be well positioned with rapid filling of the proximal stomach.  There is rapid emptying into what was presumed to represent the efferent loop.  Small bowel contrast appears to rapidly extend into the right lower quadrant and fill the lumen of the cecum. The segment of the small bowel between the stomach and cecum appears to be significantly shorter than that suggested on CT, implying possible development of a fistulous communication.  No extravasation is identified.  No opacification of the afferent loop was achieved.  There was significant reflux into the esophagus to the level of the cricopharyngeus while turning the patient.  IMPRESSION:  1.  Percutaneous gastrostomy tube appears well positioned. There is no extravasation. 2.  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  As correlated with the recent CT, the efferent loop is not clearly opacified but did appear to be patent on CT.  Follow-up CT may be helpful to better define this anatomy. 3.  Significant gastroesophageal reflux.  Original Report Authenticated By: Gerrianne Scale, M.D.    Anti-infectives: Anti-infectives     Start     Dose/Rate Route Frequency Ordered Stop   05/07/11 1400  imipenem-cilastatin (PRIMAXIN) 500 mg in sodium chloride 0.9 % 100 mL IVPB       500 mg 200 mL/hr over 30 Minutes Intravenous 3 times per day 05/07/11 1106 05/21/11 1359   05/07/11 1200   micafungin (MYCAMINE) 100  mg in sodium chloride 0.9 % 100 mL IVPB  Status:  Discontinued     Comments: PER PHARMACY      100 mg 100 mL/hr over 1 Hours Intravenous Every 24 hours 05/07/11 1032 05/19/11 1103   05/07/11 1200   vancomycin (VANCOCIN) 1,250 mg in sodium chloride 0.9 % 250 mL IVPB  Status:  Discontinued        1,250 mg 166.7 mL/hr over 90 Minutes Intravenous Every 24 hours 05/07/11 1106  05/19/11 1103   05/07/11 1000   micafungin (MYCAMINE) 150 mg in sodium chloride 0.9 % 100 mL IVPB  Status:  Discontinued     Comments: PER PHARMACY      150 mg 100 mL/hr over 1 Hours Intravenous Daily 05/07/11 0951 05/07/11 1029   05/07/11 0300   ertapenem (INVANZ) 1 g in sodium chloride 0.9 % 50 mL IVPB        1 g 100 mL/hr over 30 Minutes Intravenous  Once 05/07/11 0300 05/07/11 0348   05/07/11 0130   cefTRIAXone (ROCEPHIN) 1 g in dextrose 5 % 50 mL IVPB        1 g 100 mL/hr over 30 Minutes Intravenous  Once 05/07/11 0122 05/07/11 0200         Current Facility-Administered Medications  Medication Dose Route Frequency Provider Last Rate Last Dose  . acetaminophen (TYLENOL) suppository 650 mg  650 mg Rectal Q6H PRN Ardeth Sportsman, MD   650 mg at 05/12/11 0809  . tpn solution (CLINIMIX E 5/15) 2,000 mL with insulin regular 35 Units infusion   Intravenous Continuous TPN Crystal Keo, PHARMD 80 mL/hr at 05/24/11 1703     And  . fat emulsion 20 % infusion 250 mL  250 mL Intravenous Continuous TPN Crystal Warsaw, PHARMD 10 mL/hr at 05/24/11 1703 250 mL at 05/24/11 1703  . tpn solution (CLINIMIX E 5/15) 2,000 mL with multivitamins adult 10 mL, trace elements Cr-Cu-Mn-Se-Zn 1 mL, insulin regular 40 Units infusion   Intravenous Continuous TPN Dannielle Huh, PHARMD 80 mL/hr at 05/25/11 1732     And  . fat emulsion 20 % infusion 250 mL  250 mL Intravenous Continuous TPN Dannielle Huh, PHARMD 10 mL/hr at 05/25/11 1733 250 mL at 05/25/11 1733  . heparin injection 5,000 Units  5,000 Units Subcutaneous Q8H Daniel J. Feinstein   5,000 Units at 05/26/11 0536  . hydrALAZINE (APRESOLINE) injection 10 mg  10 mg Intravenous Q8H PRN Vassie Loll, MD      . insulin aspart (novoLOG) injection 0-15 Units  0-15 Units Subcutaneous Q4H Crystal [place], [place]   3 Units at 05/26/11 0815  . lip balm (CARMEX) ointment 1 application  1 application Topical  BID Ardeth Sportsman, MD   1 application at 05/25/11 2118  . LORazepam (ATIVAN) injection 0.5 mg  0.5 mg Intravenous Q8H PRN Vassie Loll, MD   0.5 mg at 05/21/11 1239  . magnesium sulfate IVPB 2 g 50 mL  2 g Intravenous Once Dannielle Huh, PHARMD   2 g at 05/25/11 1103  . morphine 4 MG/ML injection 3 mg  3 mg Intravenous Q3H PRN Vassie Loll, MD   3 mg at 05/26/11 0641  . pantoprazole (PROTONIX) injection 40 mg  40 mg Intravenous Q12H Ardeth Sportsman, MD   40 mg at 05/25/11 2114  . phenol (CHLORASEPTIC) mouth spray 1 spray  1 spray Mouth/Throat Q6H PRN Vassie Loll, MD      . sodium chloride 0.45 %  1,000 mL with potassium chloride 40 mEq infusion   Intravenous Continuous Vassie Loll, MD 15 mL/hr at 05/25/11 1731    . sodium chloride 0.9 % injection 10 mL  10 mL Intracatheter PRN Wesam Yacoub   10 mL at 05/26/11 0847  . tpn solution (CLINIMIX E 5/15) 2,000 mL with insulin regular 50 Units infusion   Intravenous Continuous TPN Dannielle Huh, PHARMD        Assessment/Plan POD 19 Exp. Lap, lysis of adhesions, antrectomy,proximal and duodenal resection, bilroth II, gastrojejunostomy anastomosis, Omental patch of duodenal bulb, gastrostomy, jejunostomy. - S. 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  Patient Active Problem List  Diagnoses  . DIABETES MELLITUS, TYPE II  . HYPERLIPIDEMIA  . Obesity, unspecified  . DEPRESSION  . HYPERTENSION  . SINUSITIS, ACUTE  . ALLERGIC RHINITIS, SEASONAL  . OTHER CHRONIC BRONCHITIS  . DIVERTICULOSIS OF COLON  . ACUTE CYSTITIS  . INSOMNIA  . FATIGUE  . UNSTEADY GAIT  . HEADACHE  . Other urinary incontinence  . CLOSED FRACTURE OF OLECRANON PROCESS OF ULNA  . HEMATOMA  . HEADACHES, HX OF  . Anorexia  . Anemia  . Syncope and collapse  . Hepatic steatosis  . Hyponatremia  . Gastric mass  . H pylori ulcer  . Ulcer, colon  . Acute renal failure  . S/P exploratory laparotomy  . Pericardial effusion   . Fever, postprocedural  . Confusion  . Leucocytosis  . Abdominal pain, acute  . Metabolic acidosis  . Esophagitis, erosive  . Acute respiratory failure  Plan: continue TNA, local wound care.      LOS: 20 days    JENNINGS,WILLARD 05/26/2011

## 2011-05-26 NOTE — Progress Notes (Signed)
Correction to insurance representative: Bonita Quin ext [phone]. PT agreed to call this number to speak with insurance company. CSW will continue to follow. Unk Lightning [phone]

## 2011-05-27 ENCOUNTER — Encounter (HOSPITAL_COMMUNITY): Payer: Self-pay | Admitting: Surgery

## 2011-05-27 LAB — GLUCOSE, CAPILLARY
Glucose-Capillary: 103 mg/dL — ABNORMAL HIGH (ref 70–99)
Glucose-Capillary: 134 mg/dL — ABNORMAL HIGH (ref 70–99)
Glucose-Capillary: 140 mg/dL — ABNORMAL HIGH (ref 70–99)
Glucose-Capillary: 159 mg/dL — ABNORMAL HIGH (ref 70–99)
Glucose-Capillary: 140 mg/dL — ABNORMAL HIGH (ref 70–99)

## 2011-05-27 LAB — BASIC METABOLIC PANEL WITH GFR
BUN: 15 mg/dL (ref 6–23)
CO2: 25 meq/L (ref 19–32)
Calcium: 8.3 mg/dL — ABNORMAL LOW (ref 8.4–10.5)
Chloride: 95 meq/L — ABNORMAL LOW (ref 96–112)
Creatinine, Ser: 0.52 mg/dL (ref 0.50–1.10)
GFR calc Af Amer: >90 mL/min
GFR calc non Af Amer: 90 mL/min — ABNORMAL LOW
Glucose, Bld: 127 mg/dL — ABNORMAL HIGH (ref 70–99)
Potassium: 4.4 meq/L (ref 3.5–5.1)
Sodium: 130 meq/L — ABNORMAL LOW (ref 135–145)

## 2011-05-27 LAB — MAGNESIUM: Magnesium: 1.6 mg/dL (ref 1.5–2.5)

## 2011-05-27 LAB — HEMOGLOBIN A1C: Mean Plasma Glucose: 146 mg/dL — ABNORMAL HIGH (ref <117)

## 2011-05-27 MED ORDER — SODIUM CHLORIDE 0.9 % IV SOLN
INTRAVENOUS | Status: DC
  Administered 2011-05-27: 15:00:00 via INTRAVENOUS

## 2011-05-27 MED ORDER — FAT EMULSION 20 % IV EMUL
250.0000 mL | INTRAVENOUS | Status: AC
  Administered 2011-05-27: 250 mL via INTRAVENOUS
  Filled 2011-05-27: qty 250

## 2011-05-27 MED ORDER — TRACE MINERALS CR-CU-MN-SE-ZN 10-1000-500-60 MCG/ML IV SOLN
INTRAVENOUS | Status: AC
  Administered 2011-05-27: 18:00:00 via INTRAVENOUS
  Filled 2011-05-27: qty 2000

## 2011-05-27 MED ORDER — MAGNESIUM SULFATE 40 MG/ML IJ SOLN
2.0000 g | Freq: Once | INTRAMUSCULAR | Status: AC
  Administered 2011-05-27: 2 g via INTRAVENOUS
  Filled 2011-05-27: qty 50

## 2011-05-27 NOTE — Progress Notes (Signed)
Patient was discussed during Long LOS meeting. Per meeting, suggestions were made for LTAC and to appeal [facility] Medicare decision to deny SNF benefits. CSW emailed Dr. Jacky Kindle patient information for him to appeal [employer]'s decision as requested during the meeting. CSW will continue to follow. Unk [place] [phone]

## 2011-05-27 NOTE — Progress Notes (Signed)
Physical Therapy Treatment [name] MRN: [id number] DOB: 1931-11-18 Today's Date: 05/27/2011  PT Assessment/Plan  PT - Assessment/Plan Comments on Treatment Session: pt again needing cues for attention to task and participation.  ?Cognition vs self-limiting.  Called [employer] x2 yesterday leaving 2 voicemails asking to speak with someone about pt's SNF eligibilty.  No one returned calls.  pt needs to D/C to SNF.  Not appropriate for [facility].   PT Plan: Discharge plan remains appropriate PT Frequency: Min 3X/week Follow Up Recommendations: Skilled nursing facility Equipment Recommended: Defer to next venue PT Goals  Acute Rehab PT Goals PT Goal: Supine/Side to Sit - Progress: Progressing toward goal PT Goal: Sit to Supine/Side - Progress: Progressing toward goal PT Transfer Goal: Sit to Stand/Stand to Sit - Progress: Progressing toward goal PT Transfer Goal: Bed to Chair/Chair to Bed - Progress: Progressing toward goal PT Goal: Ambulate - Progress: Progressing toward goal  PT Treatment Precautions/Restrictions  Precautions Precautions: Fall Precaution Comments: Secondary to patient restless Restrictions Weight Bearing Restrictions: No Mobility (including Balance) Bed Mobility Bed Mobility: Yes Supine to Sit: 1: +2 Total assist (pt 50%) Supine to Sit Details (indicate cue type and reason): Max cueing for technique, encouragement, attention to task Sitting - Scoot to Edge of Bed: 2: Max assist Transfers Transfers: Yes Sit to Stand: 1: +2 Total assist;Patient percentage (comment);From bed (pt 30%) Sit to Stand Details (indicate cue type and reason): Max cueing for technique andencouragement Stand to Sit: 1: +2 Total assist;Patient percentage (comment);To chair/3-in-1 (pt 30%) Stand to Sit Details: cueing to use UEs and participation Stand Pivot Transfers: 1: +2 Total assist (pt 30%) Stand Pivot Transfer Details (indicate cue type and reason): cues and A  for step-by-step movement through transfer Ambulation/Gait Ambulation/Gait: No Stairs: No    Exercise    End of Session PT - End of Session Equipment Utilized During Treatment: Gait belt Activity Tolerance: Patient limited by pain Patient left: in chair;with call bell in reach Nurse Communication: Mobility status for transfers General Behavior During Session: [name] for tasks performed Cognition: [name] for tasks performed  [name], [place] [phone] 05/27/2011, 10:58 AM

## 2011-05-27 NOTE — Progress Notes (Signed)
General Surgery Note POD# 20  Assessment/Plan:   1.  Large peri-pyloric ulcer which perforated - EXPLORATORY LAPAROTOMY, GASTROSTOMY, GASTRECTOMY (BII), CHOLECYSTECTOMY - 05/07/2011 - S. Gross. 2 drains in abdomen in right abdomen, midline wound good, jejunal tube out and draining    2.  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  For the short term, will be on bowel rest, TPN, and repeat CT scan in 7 to 10 days.  3.  Gastrostomy tube (J tube is out)     4.  DIABETES MELLITUS, TYPE II (09/13/2007)  5.  VTE prophylaxis - SQ Hep  6.  Malnutrition - on TPN  7.  Other medical problems:    HYPERTENSION (09/13/2007) Anemia - hgb - 10.0 on 05/25/2011 Hyponatremia- resolved Esophagitis, erosive (05/07/2011)  Acute respiratory failure (05/12/2011) - resolved  Daughter, Langston Reusing [phone] - discussed condition and discussed the long term nature of her problem.    LOS: 21 days   Subjective:  Complains of abdominal pain.  But no specific complaint.  Objective:   Filed Vitals:   05/27/11 1300  BP: 163/79  Pulse: 110  Temp: 101.7 F (38.7 C)  Resp: 16     Intake/Output from previous day:  11/27 0701 - 11/28 0700 In: 3480 [I.V.:1560; TPN:1920] Out: 1730 [Urine:1400; Drains:330]  Intake/Output this shift:  Total I/O In: -  Out: 1250 [Urine:1250]   Physical Exam:   General: Older AA F who is more alert than the other day.  But does not express herself well.    HEENT: Normal. Pupils equal. Good dentition. .   Lungs: Clear   Abdomen: 2 right abdominal drains.  Midline wound clean.  Drainage bag over jejunostomy tube site.   Neurologic:  Grossly intact to motor and sensory function.   Psychiatric: depressed.  Lab Results:    [facility] 05/25/11 0530  WBC 5.7  HGB 10.0*  HCT 31.1*  PLT 246    BMET   Basename 05/27/11 0520 05/25/11 0530  NA 130* 133*  K 4.4 3.9  CL 95* 101  CO2 25 24  GLUCOSE 127* 167*  BUN  15 13  CREATININE 0.52 0.45*  CALCIUM 8.3* 8.1*    PT/INR  No results found for this basename: LABPROT:2,INR:2 in the last 72 hours  ABG  No results found for this basename: PHART:2,PCO2:2,PO2:2,HCO3:2 in the last 72 hours   Studies/Results:  No results found.   Anti-infectives:   Anti-infectives     Start     Dose/Rate Route Frequency Ordered Stop   05/07/11 1400  imipenem-cilastatin (PRIMAXIN) 500 mg in sodium chloride 0.9 % 100 mL IVPB       500 mg 200 mL/hr over 30 Minutes Intravenous 3 times per day 05/07/11 1106 05/21/11 1359   05/07/11 1200   micafungin (MYCAMINE) 100 mg in sodium chloride 0.9 % 100 mL IVPB  Status:  Discontinued     Comments: PER PHARMACY      100 mg 100 mL/hr over 1 Hours Intravenous Every 24 hours 05/07/11 1032 05/19/11 1103   05/07/11 1200   vancomycin (VANCOCIN) 1,250 mg in sodium chloride 0.9 % 250 mL IVPB  Status:  Discontinued        1,250 mg 166.7 mL/hr over 90 Minutes Intravenous Every 24 hours 05/07/11 1106 05/19/11 1103   05/07/11 1000   micafungin (MYCAMINE) 150 mg in sodium chloride 0.9 % 100 mL IVPB  Status:  Discontinued  Comments: PER PHARMACY      150 mg 100 mL/hr over 1 Hours Intravenous Daily 05/07/11 0951 05/07/11 1029   05/07/11 0300   ertapenem (INVANZ) 1 g in sodium chloride 0.9 % 50 mL IVPB        1 g 100 mL/hr over 30 Minutes Intravenous  Once 05/07/11 0300 05/07/11 0348   05/07/11 0130   cefTRIAXone (ROCEPHIN) 1 g in dextrose 5 % 50 mL IVPB        1 g 100 mL/hr over 30 Minutes Intravenous  Once 05/07/11 0122 05/07/11 0200          Ovidio Kin, MD, FACS Pager: [phone],   [facility] Office: [phone] 05/27/2011

## 2011-05-27 NOTE — Progress Notes (Signed)
Discussed in the long length of stay meeting mitral regurgitation 05/27/2011

## 2011-05-27 NOTE — Progress Notes (Signed)
PARENTERAL NUTRITION CONSULT NOTE - FOLLOW UP  Pharmacy Consult for TPN Indication: Bowel rest/NPO, Not tolerating TF and J-tube out. Evidence of jejunum/ileum fistula. s/p gastrectomy, gastrostomy, cholecysectomy for erosive esophagitis and perforated ulcer.   Allergies  Allergen Reactions  . Other Swelling    Pinto beans cause swelling and hives  . Penicillins Swelling    Patient Measurements: Height: 5\' 6"  (167.6 cm) Weight: 171 lb 4.8 oz (77.701 kg) IBW/kg (Calculated) : 59.3   Vital Signs: Temp: 100.6 F (38.1 C) (11/28 0432) Temp src: Oral (11/28 0432) BP: 151/79 mmHg (11/28 0432) Pulse Rate: 112  (11/28 0432) Intake/Output from previous day: 11/27 0701 - 11/28 0700 In: 3480 [I.V.:1560; TPN:1920] Out: 1730 [Urine:1400; Drains:330] Intake/Output from this shift:    Labs:  [facility] 05/25/11 0530  WBC 5.7  HGB 10.0*  HCT 31.1*  PLT 246  APTT --  INR --     Basename 05/27/11 0520 05/25/11 0530  NA 130* 133*  K 4.4 3.9  CL 95* 101  CO2 25 24  GLUCOSE 127* 167*  BUN 15 13  CREATININE 0.52 0.45*  LABCREA -- --  CREAT24HRUR -- --  CALCIUM 8.3* 8.1*  MG 1.6 1.4*  PHOS -- 4.3  PROT -- 6.0  ALBUMIN -- 1.7*  AST -- 13  ALT -- 6  ALKPHOS -- 98  BILITOT -- 0.2*  BILIDIR -- --  IBILI -- --  PREALBUMIN -- 7.0*  CHOLHDL -- --  CHOL -- 98   Estimated Creatinine Clearance: 62 ml/min (by C-G formula based on Cr of 0.52).    Basename 05/27/11 0742 05/27/11 0401 05/27/11 0015  GLUCAP 134* 103* 133*    Medications:  Scheduled:    . heparin subcutaneous  5,000 Units Subcutaneous Q8H  . insulin aspart  0-15 Units Subcutaneous Q4H  . lip balm  1 application Topical BID  . pantoprazole (PROTONIX) IV  40 mg Intravenous Q12H   Infusions:    . TPN (CLINIMIX) +/- additives 80 mL/hr at 05/25/11 1732   And  . fat emulsion 250 mL (05/25/11 1733)  . sodium chloride 0.45 % with kcl 15 mL/hr at 05/27/11 0800  . TPN (CLINIMIX) +/- additives 80 mL/hr at 05/27/11  0800    Insulin Requirements in the past 24 hours:  9 units novolog SSI  50 units regular insulin/TPN bag  Current Nutrition:  TPN + lipids = 1843 kcal, 96g protein  TPN withOUT lipids = 1363 kcal and 96 gm protein  Assessment:  77 y/o previously on TPN (For erosive esophagitis with perforated pyloric ulcer s/p lap. 11/7 with gastrostomy, gastrectomy and cholecystectomy) TPN initially started 05/12/11 - 05/14/2011. Resumed TPN 05/19/11 for increased J-tube output and and J-tube displacement. Awaiting J tube site closure. 11/26 UGI reveals likely SB fistula (jejunum and ileum). Prealbumin unchanged from previous (=7). CBGs under better control with increase insulin in TPN. Na and Cl low this morning, Magnesium low end normal.   Nutritional Goals:  1785-2000 kcal and 95-110 g protein   Plan:  1. Tolerating TPN and CBG better controlled 2. Due to national backorder, Lipids, MVI, and trace elements will be given MWF 3. Replace Magnesium 4. Labs in am 5. Will consider cycling TPN if TPN thought to be long-term.   Dannielle Huh 05/27/2011,10:38 AM

## 2011-05-27 NOTE — Progress Notes (Signed)
Patient ID: [name], female   DOB: 03/20/1932, 77 y.o.   MRN: [id number] Subjective: No events overnight.Pt continues to experience abdominal pain, with some nausea and one episode of vomiting.  Objective:  Vital signs in last 24 hours:  Filed Vitals:   05/26/11 2340 05/27/11 0432 05/27/11 0648 05/27/11 1300  BP:  151/79  163/79  Pulse:  112  110  Temp: 100.1 F (37.8 C) 100.6 F (38.1 C)  101.7 F (38.7 C)  TempSrc: Oral Oral    Resp:  18  16  Height:      Weight:   77.701 kg (171 lb 4.8 oz)   SpO2:  97%  97%   Intake/Output from previous day:  Intake/Output Summary (Last 24 hours) at 05/27/11 1414 Last data filed at 05/27/11 1300  Gross per 24 hour  Intake   3480 ml  Output   2030 ml  Net   1450 ml    Physical Exam: General: Alert, awake, oriented x3,calm, lying on bed.  HEENT: No bruits, no goiter.  Heart: Mild tachycardia, without murmurs, rubs, gallops.  Lungs: Clear to auscultation bilaterally.  Abdomen: Soft, less tender, nondistended, quiet BS. Colostomy bag in place with some stool, G tube also in place; incision clean and pink.  Extremities: No clubbing cyanosis or edema with positive pedal pulses.  Neuro: Grossly intact, nonfocal.  Lab Results:  Basic Metabolic Panel:    Component Value Date/Time   NA 130* 05/27/2011 0520   K 4.4 05/27/2011 0520   CL 95* 05/27/2011 0520   CO2 25 05/27/2011 0520   BUN 15 05/27/2011 0520   CREATININE 0.52 05/27/2011 0520   GLUCOSE 127* 05/27/2011 0520   CALCIUM 8.3* 05/27/2011 0520   CBC:    Component Value Date/Time   WBC 5.7 05/25/2011 0530   HGB 10.0* 05/25/2011 0530   HCT 31.1* 05/25/2011 0530   PLT 246 05/25/2011 0530   MCV 81.6 05/25/2011 0530   NEUTROABS 3.8 05/25/2011 0530   LYMPHSABS 1.3 05/25/2011 0530   MONOABS 0.4 05/25/2011 0530   EOSABS 0.1 05/25/2011 0530   BASOSABS 0.1 05/25/2011 0530      Lab 05/25/11 0530 05/23/11 0535 05/22/11 0700 05/21/11 0530  WBC 5.7 7.8 7.6 7.9  HGB 10.0*  8.3* 8.2* 8.3*  HCT 31.1* 26.1* 25.7* 25.6*  PLT 246 292 338 318  MCV 81.6 81.3 81.1 80.8  MCH 26.2 25.9* 25.9* 26.2  MCHC 32.2 31.8 31.9 32.4  RDW 21.4* 20.6* 20.4* 20.1*  LYMPHSABS 1.3 -- -- 1.3  MONOABS 0.4 -- -- 0.5  EOSABS 0.1 -- -- 0.1  BASOSABS 0.1 -- -- 0.1  BANDABS -- -- -- --    Lab 05/27/11 0520 05/25/11 0530 05/23/11 0535 05/21/11 0530  NA 130* 133* 135 136  K 4.4 3.9 3.8 3.1*  CL 95* 101 102 103  CO2 25 24 25 23   GLUCOSE 127* 167* 146* 148*  BUN 15 13 10 10   CREATININE 0.52 0.45* 0.45* 0.47*  CALCIUM 8.3* 8.1* 8.4 7.9*  MG 1.6 1.4* 1.6 1.9   No results found for this basename: INR:5,PROTIME:5 in the last 168 hours Cardiac markers: No results found for this basename: CK:3,CKMB:3,TROPONINI:3,MYOGLOBIN:3 in the last 168 hours No results found for this basename: POCBNP:3 in the last 168 hours No results found for this or any previous visit (from the past 240 hour(s)).  Studies/Results: No results found.  Medications: Scheduled Meds:   . heparin subcutaneous  5,000 Units Subcutaneous Q8H  . insulin aspart  0-15 Units Subcutaneous Q4H  . lip balm  1 application Topical BID  . magnesium sulfate IVPB  2 g Intravenous Once  . pantoprazole (PROTONIX) IV  40 mg Intravenous Q12H   Continuous Infusions:   . TPN (CLINIMIX) +/- additives 80 mL/hr at 05/25/11 1732   And  . fat emulsion 250 mL (05/25/11 1733)  . TPN (CLINIMIX) +/- additives     And  . fat emulsion    . sodium chloride 0.45 % with kcl 15 mL/hr at 05/27/11 0800  . TPN (CLINIMIX) +/- additives 80 mL/hr at 05/27/11 0800   PRN Meds:.acetaminophen, hydrALAZINE, LORazepam, morphine injection, phenol, sodium chloride  Assessment/Plan:  Principal Problem:  *Abdominal pain, acute - improving but still intermittently present, continue supportive care  Active Problems:  DIABETES MELLITUS, TYPE II - well controlled on current medication regimen   HYPERTENSION - well controlled, will continue to  monitor   Anemia - Hg/Hct stable and pt's baseline   Hyponatremia - provide gentle hydration, check BMP in AM   Metabolic acidosis - now resolved   Esophagitis, erosive - continue protonix   Acute respiratory failure - now resolved     LOS: 21 days   MAGICK-Aislin Onofre 05/27/2011, 2:14 PM

## 2011-05-28 LAB — BASIC METABOLIC PANEL
BUN: 16 mg/dL (ref 6–23)
Calcium: 8.2 mg/dL — ABNORMAL LOW (ref 8.4–10.5)
GFR calc Af Amer: >90 mL/min (ref >90)
GFR calc Af Amer: >90 mL/min (ref >90)
GFR calc non Af Amer: >90 mL/min (ref >90)
GFR calc non Af Amer: 90 mL/min — ABNORMAL LOW (ref >90)
Potassium: 4.1 mEq/L (ref 3.5–5.1)
Potassium: 4.3 mEq/L (ref 3.5–5.1)
Sodium: 126 mEq/L — ABNORMAL LOW (ref 135–145)
Sodium: 126 mEq/L — ABNORMAL LOW (ref 135–145)

## 2011-05-28 LAB — CBC
MCH: 25.6 pg — ABNORMAL LOW (ref 26.0–34.0)
MCHC: 31.3 g/dL (ref 30.0–36.0)
Platelets: 441 10*3/uL — ABNORMAL HIGH (ref 150–400)
RDW: 21.2 % — ABNORMAL HIGH (ref 11.5–15.5)

## 2011-05-28 LAB — GLUCOSE, CAPILLARY
Glucose-Capillary: 107 mg/dL — ABNORMAL HIGH (ref 70–99)
Glucose-Capillary: 139 mg/dL — ABNORMAL HIGH (ref 70–99)

## 2011-05-28 MED ORDER — SODIUM CHLORIDE 0.9 % IV SOLN
INTRAVENOUS | Status: DC
  Administered 2011-05-28 – 2011-05-29 (×2): via INTRAVENOUS

## 2011-05-28 MED ORDER — DEXTROSE 5 % IV SOLN
INTRAVENOUS | Status: DC
  Administered 2011-05-28: 09:00:00 via INTRAVENOUS

## 2011-05-28 MED ORDER — MORPHINE SULFATE 4 MG/ML IJ SOLN
3.0000 mg | INTRAMUSCULAR | Status: DC | PRN
  Administered 2011-05-28 – 2011-05-29 (×5): 3 mg via INTRAVENOUS
  Administered 2011-05-29: 2 mg via INTRAVENOUS
  Administered 2011-05-30 – 2011-06-11 (×60): 3 mg via INTRAVENOUS
  Filled 2011-05-28 (×68): qty 1

## 2011-05-28 MED ORDER — INSULIN REGULAR HUMAN 100 UNIT/ML IJ SOLN
INTRAVENOUS | Status: AC
  Administered 2011-05-28: 18:00:00 via INTRAVENOUS
  Filled 2011-05-28: qty 2000

## 2011-05-28 NOTE — Progress Notes (Addendum)
Nutrition Follow-Up  Pt remains NPO with bowel rest. Noted jejunal-ileal fistula on UGI 11/26. Receiving TPN with Clinimix E 5/15 @ 80 ml/hr.  Lipids (20% IVFE @ 10 ml/hrs), multivitamins, and trace elements are provided 3 times weekly (MWF) due to national backorder.  Provides 1,568 kcal and 96 grams protein daily (based on weekly average).  Meets 88% minimum estimated kcal and 100% minimum estimated protein needs.  Estimated Nutrition Needs: 1,785-2,000 kcals, 95-110 gms protein  Weight stable at 74.9 kg (11/29)  IVF: NS @ 50 ml/hr  Labs: BMET    Component Value Date/Time   NA 126* 05/28/2011 0734   K 4.1 05/28/2011 0734   CL 92* 05/28/2011 0734   CO2 27 05/28/2011 0734   GLUCOSE 143* 05/28/2011 0734   BUN 16 05/28/2011 0734   CREATININE 0.48* 05/28/2011 0734   CALCIUM 8.2* 05/28/2011 0734   GFRNONAA >90 05/28/2011 0734   GFRAA >90 05/28/2011 0734    I/O last 3 completed shifts: In: 3562.7 [I.V.:180] Out: 3480 [Urine:3300; Drains:180] Total I/O In: -  Out: 500 [Urine:500]  Nutrition Dx: Inadequate Oral Intake, ongoing. Goal: TPN to meet 100% of protein needs, maximize energy provision as able during national lipid backorder, met.  Plan:  TPN per pharmacy  RD to follow for nutrition care plan  Kirkland Hun, RD, LDN Pager#: [phone]

## 2011-05-28 NOTE — Progress Notes (Addendum)
21 Days Post-Op   Subjective: C/o abd pain now.  Objective: Vital signs in last 24 hours: Temp:  [98.9 F (37.2 C)-101.7 F (38.7 C)] 99 F (37.2 C) (11/29 0518) Pulse Rate:  [110-117] 115  (11/29 0518) Resp:  [16-20] 20  (11/29 0518) BP: (131-163)/(70-79) 139/73 mmHg (11/29 0518) SpO2:  [96 %-98 %] 96 % (11/29 0518) Weight:  [74.9 kg (165 lb 2 oz)] 165 lb 2 oz (74.9 kg) (11/29 0518)    Intake/Output from previous day: 11/28 0701 - 11/29 0700 In: 2400 [TPN:2400] Out: 2900 [Urine:2850; Drains:50] Intake/Output this shift:    General appearance: alert and mild distress Resp: clear to auscultation bilaterally Cardio: regular rate and rhythm GI: Dressing in place, did not take down because of pt discomfort. Drain #2 with tan, cloudy drainage. Drain#1 empty.  Lab Results:   [facility] 05/28/11 0734  WBC 8.1  HGB 8.2*  HCT 26.2*  PLT 441*   BMET  Basename 05/28/11 0734 05/27/11 0520  NA 126* 130*  K 4.1 4.4  CL 92* 95*  CO2 27 25  GLUCOSE 143* 127*  BUN 16 15  CREATININE 0.48* 0.52  CALCIUM 8.2* 8.3*    Assessment/Plan:  1. Large peri-pyloric ulcer which perforated -   EXPLORATORY LAPAROTOMY, GASTROSTOMY, GASTRECTOMY (BII), CHOLECYSTECTOMY - 05/07/2011 - S. Gross.  2 drains in abdomen in right abdomen, midline wound good, jejunal tube out and draining   Drain #1 with no output in 48 hours -- ?D/C 2. 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 For the short term, will be on bowel rest, TPN, and repeat CT scan in 7 to 10 days.  3. Gastrostomy tube   (J tube is out)  4. DIABETES MELLITUS, TYPE II (09/13/2007)  5. VTE prophylaxis - SQ Hep  6. Malnutrition - on TPN  7. Other medical problems:  HYPERTENSION (09/13/2007) Anemia - hgb - 10.0 on 05/25/2011 Hyponatremia- resolved Esophagitis, erosive (05/07/2011)  Acute respiratory failure (05/12/2011) - resolved   LOS: 22 days   [name],[name]  J. 05/28/2011  Stable.  Looking at long term support with TPN.  [name]  05/28/2011

## 2011-05-28 NOTE — Plan of Care (Signed)
Problem: Phase II Progression Outcomes Goal: Discharge plan established Outcome: Progressing Working towards d/c to ltac Goal: IV changed to normal saline lock Outcome: Not Progressing Pt still needs TPN via picc line because she is still on bowel rest

## 2011-05-28 NOTE — Progress Notes (Signed)
Patient ID: [name], female   DOB: 1931/08/26, 77 y.o.   MRN: [id number] Subjective: No events overnight. Patient reports some improvement in abdominal pain.  Objective:  Vital signs in last 24 hours:  Filed Vitals:   05/27/11 0648 05/27/11 1300 05/27/11 2032 05/28/11 0518  BP:  163/79 131/70 139/73  Pulse:  110 117 115  Temp:  101.7 F (38.7 C) 98.9 F (37.2 C) 99 F (37.2 C)  TempSrc:      Resp:  16 20 20   Height:      Weight: 77.701 kg (171 lb 4.8 oz)   74.9 kg (165 lb 2 oz)  SpO2:  97% 98% 96%    Intake/Output from previous day:   Intake/Output Summary (Last 24 hours) at 05/28/11 0830 Last data filed at 05/28/11 0600  Gross per 24 hour  Intake   2400 ml  Output   2900 ml  Net   -500 ml    Physical Exam: General: Alert, awake, oriented x3, in no acute distress. HEENT: No bruits, no goiter. Moist mucous membranes, no scleral icterus, no conjunctival pallor. Heart: Regular rhythm but tachycardic, without murmurs, rubs, gallops. Lungs: Clear to auscultation bilaterally. No wheezing, no rhonchi, no rales.  Abdomen: 2 right abdominal drains. Midline wound clean. Drainage bag over jejunostomy tube site. Extremities: No clubbing cyanosis or edema,  positive pedal pulses. Neuro: Grossly intact, nonfocal.    Lab Results:  Basic Metabolic Panel:    Component Value Date/Time   NA 126* 05/28/2011 0734   K 4.1 05/28/2011 0734   CL 92* 05/28/2011 0734   CO2 27 05/28/2011 0734   BUN 16 05/28/2011 0734   CREATININE 0.48* 05/28/2011 0734   GLUCOSE 143* 05/28/2011 0734   CALCIUM 8.2* 05/28/2011 0734   CBC:    Component Value Date/Time   WBC 8.1 05/28/2011 0734   HGB 8.2* 05/28/2011 0734   HCT 26.2* 05/28/2011 0734   PLT 441* 05/28/2011 0734   MCV 81.9 05/28/2011 0734   NEUTROABS 3.8 05/25/2011 0530   LYMPHSABS 1.3 05/25/2011 0530   MONOABS 0.4 05/25/2011 0530   EOSABS 0.1 05/25/2011 0530   BASOSABS 0.1 05/25/2011 0530      Lab 05/28/11 0734 05/25/11  0530 05/23/11 0535 05/22/11 0700  WBC 8.1 5.7 7.8 7.6  HGB 8.2* 10.0* 8.3* 8.2*  HCT 26.2* 31.1* 26.1* 25.7*  PLT 441* 246 292 338  MCV 81.9 81.6 81.3 81.1  MCH 25.6* 26.2 25.9* 25.9*  MCHC 31.3 32.2 31.8 31.9  RDW 21.2* 21.4* 20.6* 20.4*  LYMPHSABS -- 1.3 -- --  MONOABS -- 0.4 -- --  EOSABS -- 0.1 -- --  BASOSABS -- 0.1 -- --  BANDABS -- -- -- --    Lab 05/28/11 0734 05/27/11 0520 05/25/11 0530 05/23/11 0535  NA 126* 130* 133* 135  K 4.1 4.4 3.9 3.8  CL 92* 95* 101 102  CO2 27 25 24 25   GLUCOSE 143* 127* 167* 146*  BUN 16 15 13 10   CREATININE 0.48* 0.52 0.45* 0.45*  CALCIUM 8.2* 8.3* 8.1* 8.4  MG -- 1.6 1.4* 1.6   No results found for this basename: INR:5,PROTIME:5 in the last 168 hours Cardiac markers: No results found for this basename: CK:3,CKMB:3,TROPONINI:3,MYOGLOBIN:3 in the last 168 hours No results found for this basename: POCBNP:3 in the last 168 hours No results found for this or any previous visit (from the past 240 hour(s)).  Studies/Results: No results found.  Medications: Scheduled Meds:   . heparin subcutaneous  5,000  Units Subcutaneous Q8H  . insulin aspart  0-15 Units Subcutaneous Q4H  . lip balm  1 application Topical BID  . magnesium sulfate IVPB  2 g Intravenous Once  . magnesium sulfate IVPB  2 g Intravenous Once  . pantoprazole (PROTONIX) IV  40 mg Intravenous Q12H   Continuous Infusions:   . sodium chloride 50 mL/hr at 05/27/11 1500  . TPN (CLINIMIX) +/- additives 80 mL/hr at 05/27/11 1824   And  . fat emulsion 250 mL (05/27/11 1825)  . sodium chloride 0.45 % with kcl 15 mL/hr at 05/27/11 0800  . TPN (CLINIMIX) +/- additives 80 mL/hr at 05/27/11 0800   PRN Meds:.acetaminophen, hydrALAZINE, LORazepam, morphine injection, phenol, sodium chloride  Assessment/Plan:  Principal Problem:  *Abdominal pain, acute - persistent pain but somewhat improved, continue supportive care and analgesia for pain  Active Problems:  DIABETES MELLITUS,  TYPE II - well controlled - continue SSI   HYPERTENSION - remains stable and at goal   Anemia - pt's baseline 8-9, Hg/Hct remain at baseline - obtain CBC in AM   Hyponatremia - unclear and possibly related to volume depletion - will continue gentle hydration and obtain BMP in AM   Metabolic acidosis - resolved   Esophagitis, erosive - continue Protonix   Acute respiratory failure - resolved   Disposition - ? Placement to LTAC pending decision   LOS: 22 days   MAGICK-Lakaisha Danish 05/28/2011, 8:30 AM

## 2011-05-28 NOTE — Progress Notes (Addendum)
PARENTERAL NUTRITION CONSULT NOTE - FOLLOW UP  Pharmacy Consult for TPN Indication: Bowel rest/NPO, Not tolerating TF and J-tube out. Evidence of jejunum/ileum fistula. s/p gastrectomy, gastrostomy, cholecysectomy for erosive esophagitis and perforated ulcer.   Allergies  Allergen Reactions  . Other Swelling    Pinto beans cause swelling and hives  . Penicillins Swelling    Patient Measurements: Height: 5\' 6"  (167.6 cm) Weight: 165 lb 2 oz (74.9 kg) IBW/kg (Calculated) : 59.3    Vital Signs: Temp: 99 F (37.2 C) (11/29 0518) BP: 139/73 mmHg (11/29 0518) Pulse Rate: 115  (11/29 0518) Intake/Output from previous day: 11/28 0701 - 11/29 0700 In: 2400 [TPN:2400] Out: 2900 [Urine:2850; Drains:50] Intake/Output from this shift:    Labs:  [facility] 05/28/11 0734  WBC 8.1  HGB 8.2*  HCT 26.2*  PLT 441*  APTT --  INR --     Basename 05/28/11 0734 05/27/11 0520  NA 126* 130*  K 4.1 4.4  CL 92* 95*  CO2 27 25  GLUCOSE 143* 127*  BUN 16 15  CREATININE 0.48* 0.52  LABCREA -- --  CREAT24HRUR -- --  CALCIUM 8.2* 8.3*  MG -- 1.6  PHOS -- --  PROT -- --  ALBUMIN -- --  AST -- --  ALT -- --  ALKPHOS -- --  BILITOT -- --  BILIDIR -- --  IBILI -- --  PREALBUMIN -- --  CHOLHDL -- --  CHOL -- --   Estimated Creatinine Clearance: 60.9 ml/min (by C-G formula based on Cr of 0.48).    Basename 05/28/11 0807 05/28/11 0409 05/27/11 2359  GLUCAP 107* 139* 118*    Medications:  Scheduled:    . heparin subcutaneous  5,000 Units Subcutaneous Q8H  . insulin aspart  0-15 Units Subcutaneous Q4H  . lip balm  1 application Topical BID  . magnesium sulfate IVPB  2 g Intravenous Once  . magnesium sulfate IVPB  2 g Intravenous Once  . pantoprazole (PROTONIX) IV  40 mg Intravenous Q12H   Infusions:    . dextrose 50 mL/hr at 05/28/11 0853  . TPN (CLINIMIX) +/- additives 80 mL/hr at 05/27/11 1824   And  . fat emulsion 250 mL (05/27/11 1825)  . sodium chloride 0.45 % with  kcl 15 mL/hr at 05/27/11 0800  . TPN (CLINIMIX) +/- additives 80 mL/hr at 05/27/11 0800  . DISCONTD: sodium chloride 50 mL/hr at 05/27/11 1500    Insulin Requirements in the past 24 hours:  7 units SSI, 50 units regular insulin in TPN  Current Nutrition:  TPN + lipids = 1843 kcal, 96g protein  TPN withOUT lipids = 1363 kcal and 96 gm protein   Assessment:  77 y/o previously on TPN (For erosive esophagitis with perforated pyloric ulcer s/p lap. 11/7 with gastrostomy, gastrectomy and cholecystectomy) TPN initially started 05/12/11 - 05/14/2011. Resumed TPN 05/19/11 for increased J-tube output and and J-tube displacement. Awaiting J tube site closure. 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 Prealbumin unchanged from previous (=7). CBGs controlled. Na and Cl trending down, suspect TPN contributing d/t low Na Concentration. I/O reveal negative fluid balance so suspect volume overload is less likely unless apparent on exam. Patient not taking po fluids, but IVF was D5W at 27ml/hr.   Nutritional Goals:  1785-2000 kcal and 95-110 g protein  Plan:  1. Tolerating TPN but experiencing hyponatremia.  I am unable increase Na+ content in TPN (premixed Clinimix TPNs are standard lytes or no lytes as only options). 2.  Due to national backorder, Lipids, MVI, and trace elements will be given MWF   3. Labs in am  4.  Evaluate if can stop MIVF or change to NS, discussed with physician.     Dannielle Huh 05/28/2011,8:52 AM

## 2011-05-29 ENCOUNTER — Inpatient Hospital Stay (HOSPITAL_COMMUNITY): Payer: Medicare HMO

## 2011-05-29 LAB — CBC
Platelets: 421 10*3/uL — ABNORMAL HIGH (ref 150–400)
RBC: 3.07 MIL/uL — ABNORMAL LOW (ref 3.87–5.11)
WBC: 8 10*3/uL (ref 4.0–10.5)

## 2011-05-29 LAB — GLUCOSE, CAPILLARY
Glucose-Capillary: 138 mg/dL — ABNORMAL HIGH (ref 70–99)
Glucose-Capillary: 161 mg/dL — ABNORMAL HIGH (ref 70–99)
Glucose-Capillary: 127 mg/dL — ABNORMAL HIGH (ref 70–99)
Glucose-Capillary: 119 mg/dL — ABNORMAL HIGH (ref 70–99)

## 2011-05-29 LAB — COMPREHENSIVE METABOLIC PANEL
ALT: 9 U/L (ref 0–35)
AST: 15 U/L (ref 0–37)
Albumin: 1.8 g/dL — ABNORMAL LOW (ref 3.5–5.2)
Alkaline Phosphatase: 92 U/L (ref 39–117)
Potassium: 4.1 mEq/L (ref 3.5–5.1)
Sodium: 127 mEq/L — ABNORMAL LOW (ref 135–145)
Total Protein: 6.3 g/dL (ref 6.0–8.3)

## 2011-05-29 LAB — MAGNESIUM: Magnesium: 1.6 mg/dL (ref 1.5–2.5)

## 2011-05-29 MED ORDER — VANCOMYCIN HCL 1000 MG IV SOLR
1250.0000 mg | INTRAVENOUS | Status: DC
  Administered 2011-05-30 – 2011-06-02 (×5): 1250 mg via INTRAVENOUS
  Filled 2011-05-29 (×7): qty 1250

## 2011-05-29 MED ORDER — MAGNESIUM SULFATE 50 % IJ SOLN
2.0000 g | Freq: Once | INTRAVENOUS | Status: AC
  Administered 2011-05-29: 2 g via INTRAVENOUS
  Filled 2011-05-29: qty 4

## 2011-05-29 MED ORDER — MAGNESIUM SULFATE 50 % IJ SOLN
2.0000 g | Freq: Once | INTRAMUSCULAR | Status: AC
  Administered 2011-05-29: 2 g via INTRAVENOUS
  Filled 2011-05-29: qty 4

## 2011-05-29 MED ORDER — SODIUM CHLORIDE 0.9 % IV SOLN
500.0000 mg | Freq: Three times a day (TID) | INTRAVENOUS | Status: DC
  Administered 2011-05-29 – 2011-06-26 (×83): 500 mg via INTRAVENOUS
  Filled 2011-05-29 (×94): qty 500

## 2011-05-29 MED ORDER — INSULIN ASPART 100 UNIT/ML ~~LOC~~ SOLN
0.0000 [IU] | SUBCUTANEOUS | Status: DC
  Administered 2011-05-30: 3 [IU] via SUBCUTANEOUS
  Filled 2011-05-29: qty 3

## 2011-05-29 MED ORDER — FAT EMULSION 20 % IV EMUL
234.0000 mL | INTRAVENOUS | Status: AC
  Administered 2011-05-29: 234 mL via INTRAVENOUS
  Filled 2011-05-29 (×2): qty 250

## 2011-05-29 MED ORDER — TRACE MINERALS CR-CU-MN-SE-ZN 10-1000-500-60 MCG/ML IV SOLN
INTRAVENOUS | Status: AC
  Administered 2011-05-29: 18:00:00 via INTRAVENOUS
  Filled 2011-05-29: qty 2000

## 2011-05-29 NOTE — Progress Notes (Signed)
22 Days Post-Op  Subjective: No real change.  Drain #1 Clear,brown colored fluid10 ml. Drain#2 90 ml brown white fluid. Gastrostomy putting our dark colored fluid, jejunostomy site still putting out stool colored drainage.  Objective: Vital signs in last 24 hours: Temp:  [99.1 F (37.3 C)-100.3 F (37.9 C)] 99.9 F (37.7 C) (11/30 0504) Pulse Rate:  [111-116] 111  (11/30 0504) Resp:  [18-20] 20  (11/30 0504) BP: (109-130)/(71-86) 125/75 mmHg (11/30 0504) SpO2:  [93 %-97 %] 97 % (11/30 0504) Weight:  [76.567 kg (168 lb 12.8 oz)] 168 lb 12.8 oz (76.567 kg) (11/30 0504)    Intake/Output from previous day: 11/29 0701 - 11/30 0700 In: 680 [I.V.:600; TPN:80] Out: 1405 [Urine:950; Drains:55; Stool:400] Intake/Output this shift: Total I/O In: -  Out: 700 [Urine:700]  General appearance: alert, cooperative, cachectic and no distress GI: soft, non-tender; bowel sounds normal; no masses,  no organomegaly and open area of abd looks good.  Lab Results:   [facility] 05/29/11 0505 05/28/11 0734  WBC 8.0 8.1  HGB 7.9* 8.2*  HCT 25.1* 26.2*  PLT 421* 441*    BMET  Basename 05/29/11 0505 05/28/11 2000  NA 127* 126*  K 4.1 4.3  CL 93* 93*  CO2 25 25  GLUCOSE 146* 137*  BUN 16 16  CREATININE 0.48* 0.52  CALCIUM 8.2* 8.0*   PT/INR No results found for this basename: LABPROT:2,INR:2 in the last 72 hours   Studies/Results: No results found.  Anti-infectives: Anti-infectives     Start     Dose/Rate Route Frequency Ordered Stop   05/07/11 1400   imipenem-cilastatin (PRIMAXIN) 500 mg in sodium chloride 0.9 % 100 mL IVPB        500 mg 200 mL/hr over 30 Minutes Intravenous 3 times per day 05/07/11 1106 05/21/11 1359   05/07/11 1200   micafungin (MYCAMINE) 100 mg in sodium chloride 0.9 % 100 mL IVPB  Status:  Discontinued     Comments: PER PHARMACY      100 mg 100 mL/hr over 1 Hours Intravenous Every 24 hours 05/07/11 1032 05/19/11 1103   05/07/11 1200   vancomycin (VANCOCIN)  1,250 mg in sodium chloride 0.9 % 250 mL IVPB  Status:  Discontinued        1,250 mg 166.7 mL/hr over 90 Minutes Intravenous Every 24 hours 05/07/11 1106 05/19/11 1103   05/07/11 1000   micafungin (MYCAMINE) 150 mg in sodium chloride 0.9 % 100 mL IVPB  Status:  Discontinued     Comments: PER PHARMACY      150 mg 100 mL/hr over 1 Hours Intravenous Daily 05/07/11 0951 05/07/11 1029   05/07/11 0300   ertapenem (INVANZ) 1 g in sodium chloride 0.9 % 50 mL IVPB        1 g 100 mL/hr over 30 Minutes Intravenous  Once 05/07/11 0300 05/07/11 0348   05/07/11 0130   cefTRIAXone (ROCEPHIN) 1 g in dextrose 5 % 50 mL IVPB        1 g 100 mL/hr over 30 Minutes Intravenous  Once 05/07/11 0122 05/07/11 0200         Current Facility-Administered Medications  Medication Dose Route Frequency Provider Last Rate Last Dose  . 0.9 %  sodium chloride infusion   Intravenous Continuous Iskra Magick-Myers 50 mL/hr at 05/29/11 0727    . acetaminophen (TYLENOL) suppository 650 mg  650 mg Rectal Q6H PRN Ardeth Sportsman, MD   650 mg at 05/27/11 1440  . tpn solution (CLINIMIX E  5/15) 2,000 mL with multivitamins adult 10 mL, trace elements Cr-Cu-Mn-Se-Zn 1 mL, insulin regular 50 Units infusion   Intravenous Cyclic-See Admin Instructions Thuy Earlie Raveling, PHARMD       And  . fat emulsion 20 % infusion 234 mL  234 mL Intravenous Cyclic-See Admin Instructions Thuy Earlie Raveling, PHARMD      . tpn solution (CLINIMIX E 5/15) 2,000 mL with multivitamins adult 10 mL, trace elements Cr-Cu-Mn-Se-Zn 1 mL, insulin regular 50 Units infusion   Intravenous Continuous TPN Dannielle Huh, PHARMD 80 mL/hr at 05/27/11 1824     And  . fat emulsion 20 % infusion 250 mL  250 mL Intravenous Continuous TPN Dannielle Huh, PHARMD 10 mL/hr at 05/27/11 1825 250 mL at 05/27/11 1825  . heparin injection 5,000 Units  5,000 Units Subcutaneous Q8H Daniel J. Feinstein   5,000 Units at 05/29/11 0500  . hydrALAZINE (APRESOLINE) injection 10 mg   10 mg Intravenous Q8H PRN Vassie Loll, MD      . insulin aspart (novoLOG) injection 0-15 Units  0-15 Units Subcutaneous Q4H Gemma Payor Hodges, [place]   2 Units at 05/29/11 1610  . insulin aspart (novoLOG) injection 0-15 Units  0-15 Units Subcutaneous Custom Thuy Dien Dang, PHARMD      . lip balm (CARMEX) ointment 1 application  1 application Topical BID Ardeth Sportsman, MD   1 application at 05/29/11 1031  . LORazepam (ATIVAN) injection 0.5 mg  0.5 mg Intravenous Q8H PRN Vassie Loll, MD   0.5 mg at 05/21/11 1239  . magnesium sulfate 2 g in dextrose 5 % 250 mL infusion  2 g Intravenous Once Thuy Dien Dang, PHARMD 125 mL/hr at 05/29/11 1028 2 g at 05/29/11 1028  . morphine 4 MG/ML injection 3 mg  3 mg Intravenous Q3H PRN Iskra Magick-Myers   2 mg at 05/29/11 0906  . pantoprazole (PROTONIX) injection 40 mg  40 mg Intravenous Q12H Ardeth Sportsman, MD   40 mg at 05/29/11 1026  . phenol (CHLORASEPTIC) mouth spray 1 spray  1 spray Mouth/Throat Q6H PRN Vassie Loll, MD      . sodium chloride 0.9 % injection 10 mL  10 mL Intracatheter PRN Wesam Yacoub   10 mL at 05/29/11 0845  . tpn solution (CLINIMIX E 5/15) 2,000 mL with insulin regular 50 Units infusion   Intravenous Continuous TPN Dannielle Huh, PHARMD 80 mL/hr at 05/28/11 2000    . DISCONTD: sodium chloride 0.45 % 1,000 mL with potassium chloride 40 mEq infusion   Intravenous Continuous Vassie Loll, MD 15 mL/hr at 05/27/11 0800      Assessment/Plan POD 22 Exploratory lap,lysis of adhesions, antrectomy, duodenal resection, BII gastrotomy, and Jejunostomy.  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 Plan:  Long term TNA  LOS: 23 days    [name] 05/29/2011

## 2011-05-29 NOTE — Progress Notes (Signed)
CSW spoke with CM who stated LTAC will give decision today if they can accept patient to facility. CSW called Dr. Jacky Kindle regarding follow up from Long LOS meeting. Dr. Jacky Kindle stated he needed additional clinical information but would contact [facility] regarding placement. CSW spoke with [facility] who stated they would be unable to accept patient with TPN. CSW will continue to work with team and assist with dc needs. Unk Lightning [phone]

## 2011-05-29 NOTE — Progress Notes (Signed)
Physical Therapy Treatment [name] MRN: [id number] DOB: 11-Dec-1931 Today's Date: 05/29/2011  PT Assessment/Plan  PT - Assessment/Plan Comments on Treatment Session: Pt able to do more than she is letting herself. Pt sat EOB with supervision but resisted all other mobility.  PT Plan: Discharge plan remains appropriate PT Frequency: Min 3X/week Follow Up Recommendations: Skilled nursing facility Equipment Recommended: Defer to next venue PT Goals  Acute Rehab PT Goals PT Goal: Supine/Side to Sit - Progress: Met PT Goal: Sit to Supine/Side - Progress: Progressing toward goal PT Transfer Goal: Sit to Stand/Stand to Sit - Progress: Progressing toward goal  PT Treatment Precautions/Restrictions  Precautions Precautions: Fall Precaution Comments: Secondary to patient restless Restrictions Weight Bearing Restrictions: No Mobility (including Balance) Bed Mobility Supine to Sit: 5: Supervision;With rails;HOB elevated (Comment degrees);Other (comment) (40 degrees) Supine to Sit Details (indicate cue type and reason): Pt able to use rails to sit EOB. Pt controlled with movement Sitting - Scoot to Edge of Bed: 4: Min assist Sitting - Scoot to [place] of Bed Details (indicate cue type and reason): A to initiate movement. Use of chuck pad  Sit to Supine - Right: 1: +2 Total assist;Other (comment);Patient percentage (comment) (20%) Sit to Supine - Right Details (indicate cue type and reason): Cues for safety. A for legs and upper body as pt reluctant to lay back to supine.  Transfers Transfers: Yes Sit to Stand: 1: +2 Total assist;Patient percentage (comment);From bed (p=50%) Sit to Stand Details (indicate cue type and reason): Cues for safe position and technique. A to initiate stand and to shift weight anteriorly and obtain upright posture Stand to Sit: 1: +2 Total assist;Other (comment);To bed;Patient percentage (comment) (p=50%) Stand to Sit Details: A to control  descent to bed. Cues for technique    Exercise    End of Session PT - End of Session Equipment Utilized During Treatment: Gait belt Activity Tolerance: Patient limited by pain Patient left: in bed;with call bell in reach Nurse Communication: Mobility status for transfers General Behavior During Session: Restless Cognition: [name] for tasks performed  Fredrich Birks 05/29/2011, 2:31 PM 05/29/2011 Fredrich Birks PTA [phone] pager [phone] office

## 2011-05-29 NOTE — Progress Notes (Signed)
Utilization Review Completed.Collin Rengel T11/30/2012   

## 2011-05-29 NOTE — Progress Notes (Signed)
ANTIBIOTIC CONSULT NOTE - INITIAL  Pharmacy Consult for Vancomycin and Primaxin Indication: empiric for abdominal pain  Allergies  Allergen Reactions  . Other Swelling    Pinto beans cause swelling and hives  . Penicillins Swelling    Patient Measurements: Height: 5\' 6"  (167.6 cm) Weight: 168 lb 12.8 oz (76.567 kg) IBW/kg (Calculated) : 59.3    Vital Signs: Temp: 100.3 F (37.9 C) (11/30 1300) BP: 134/85 mmHg (11/30 1300) Pulse Rate: 106  (11/30 1300) Intake/Output from previous day: 11/29 0701 - 11/30 0700 In: 680 [I.V.:600; TPN:80] Out: 1405 [Urine:950; Drains:55; Stool:400] Intake/Output from this shift:    Labs:  Basename 05/29/11 0505 05/28/11 2000 05/28/11 0734  WBC 8.0 -- 8.1  HGB 7.9* -- 8.2*  PLT 421* -- 441*  LABCREA -- -- --  CREATININE 0.48* 0.52 0.48*   Estimated Creatinine Clearance: 61.5 ml/min (by C-G formula based on Cr of 0.48). No results found for this basename: VANCOTROUGH:2,VANCOPEAK:2,VANCORANDOM:2,GENTTROUGH:2,GENTPEAK:2,GENTRANDOM:2,TOBRATROUGH:2,TOBRAPEAK:2,TOBRARND:2,AMIKACINPEAK:2,AMIKACINTROU:2,AMIKACIN:2, in the last 72 hours   Microbiology: Recent Results (from the past 720 hour(s))  SURGICAL PCR SCREEN     Status: Abnormal   Collection Time   05/07/11  9:30 AM      Component Value Range Status Comment   MRSA, PCR NEGATIVE  NEGATIVE  Final    Staphylococcus aureus POSITIVE (*) NEGATIVE  Final   URINE CULTURE     Status: Normal   Collection Time   05/07/11 10:20 AM      Component Value Range Status Comment   Specimen Description URINE, RANDOM   Final    Special Requests NONE   Final    Setup Time [id number]   Final    Colony Count NO GROWTH   Final    Culture NO GROWTH   Final    Report Status 05/08/2011 FINAL   Final   CULTURE, BLOOD (ROUTINE X 2)     Status: Normal   Collection Time   05/07/11 11:20 AM      Component Value Range Status Comment   Specimen Description BLOOD   Final    Special Requests     Final    Value:  BOTTLES DRAWN AEROBIC AND ANAEROBIC 5CC RT SUBCLAVIAN VEIN   Setup Time [id number]   Final    Culture NO GROWTH 5 DAYS   Final    Report Status 05/13/2011 FINAL   Final   CULTURE, BLOOD (ROUTINE X 2)     Status: Normal   Collection Time   05/07/11 11:20 AM      Component Value Range Status Comment   Specimen Description BLOOD   Final    Special Requests     Final    Value: BOTTLES DRAWN AEROBIC AND ANAEROBIC 5CC RT SUBCLAVIAN VEIN   Setup Time [id number]   Final    Culture NO GROWTH 5 DAYS   Final    Report Status 05/13/2011 FINAL   Final   CULTURE, RESPIRATORY     Status: Normal   Collection Time   05/13/11  6:02 AM      Component Value Range Status Comment   Specimen Description ENDOTRACHEAL   Final    Special Requests NONE   Final    Gram Stain     Final    Value: FEW WBC PRESENT, PREDOMINANTLY PMN     RARE SQUAMOUS EPITHELIAL CELLS PRESENT     NO ORGANISMS SEEN   Culture NO GROWTH 2 DAYS   Final    Report Status 05/15/2011 FINAL   Final  Medical History: Past Medical History  Diagnosis Date  . Hypertension   . Reflux   . Right elbow pain     OTIF  . Dementia   . Depression   . Osteoarthritis   . Pancreatitis 11/2007    HOP  . Angiomyolipoma of kidney     right  . Ulcerative esophagitis 12/05/2007    hx elevated gastrin, severe on EGD by Dr Jena Gauss , h pylori negative  . Hiatal hernia   . S/P colonoscopy 2009    pt reports normal by Dr Lovell Sheehan  . Diabetes mellitus   . Anemia   . Hyponatremia     Medications:  Anti-infectives     Start     Dose/Rate Route Frequency Ordered Stop   05/29/11 2100   imipenem-cilastatin (PRIMAXIN) 500 mg in sodium chloride 0.9 % 100 mL IVPB        500 mg 200 mL/hr over 30 Minutes Intravenous Every 8 hours 05/29/11 2007     05/29/11 2100   vancomycin (VANCOCIN) 1,250 mg in sodium chloride 0.9 % 250 mL IVPB        1,250 mg 166.7 mL/hr over 90 Minutes Intravenous Every 24 hours 05/29/11 2007     05/07/11 1400    imipenem-cilastatin (PRIMAXIN) 500 mg in sodium chloride 0.9 % 100 mL IVPB        500 mg 200 mL/hr over 30 Minutes Intravenous 3 times per day 05/07/11 1106 05/21/11 1359   05/07/11 1200   micafungin (MYCAMINE) 100 mg in sodium chloride 0.9 % 100 mL IVPB  Status:  Discontinued     Comments: PER PHARMACY      100 mg 100 mL/hr over 1 Hours Intravenous Every 24 hours 05/07/11 1032 05/19/11 1103   05/07/11 1200   vancomycin (VANCOCIN) 1,250 mg in sodium chloride 0.9 % 250 mL IVPB  Status:  Discontinued        1,250 mg 166.7 mL/hr over 90 Minutes Intravenous Every 24 hours 05/07/11 1106 05/19/11 1103   05/07/11 1000   micafungin (MYCAMINE) 150 mg in sodium chloride 0.9 % 100 mL IVPB  Status:  Discontinued     Comments: PER PHARMACY      150 mg 100 mL/hr over 1 Hours Intravenous Daily 05/07/11 0951 05/07/11 1029   05/07/11 0300   ertapenem (INVANZ) 1 g in sodium chloride 0.9 % 50 mL IVPB        1 g 100 mL/hr over 30 Minutes Intravenous  Once 05/07/11 0300 05/07/11 0348   05/07/11 0130   cefTRIAXone (ROCEPHIN) 1 g in dextrose 5 % 50 mL IVPB        1 g 100 mL/hr over 30 Minutes Intravenous  Once 05/07/11 0122 05/07/11 0200         Assessment: 77 yo F known to pharmacy from previous antibiotic dosing. Pharmacy consulted to start vancomycin and Zosyn, however, patient has a PCN allergy. Spoke with Benedetto Coons and Zosyn changed to Primaxin which patient has previously tolerated.  Previous antibiotic course: Vancomycin 1250 mg q24h with therapeutic trough from 11/8-11/20 Primaxin 500 mg q8h from 11/8-11/22  Goal of Therapy:  Vancomycin trough level 15-20 mcg/ml  Plan:  1. Vancomycin 1250 mg IV q24h 2. Primaxin 500 mg IV q8h 3. F/U CT abd/pelvis and renal fxn  Lovell Sheehan 05/29/2011,8:08 PM

## 2011-05-29 NOTE — Progress Notes (Signed)
Patient ID: [name], female   DOB: Jun 09, 1932, 77 y.o.   MRN: [id number]  Subjective: No events overnight. Patient denies chest pain, shortness of breath, abdominal pain.   Objective:  Vital signs in last 24 hours:  Filed Vitals:   05/28/11 1300 05/28/11 2108 05/29/11 0504 05/29/11 1300  BP: 130/86 109/71 125/75 134/85  Pulse: 112 116 111 106  Temp: 100.3 F (37.9 C) 99.1 F (37.3 C) 99.9 F (37.7 C) 100.3 F (37.9 C)  TempSrc:  Oral Oral   Resp: 18 20 20 18   Height:      Weight:   76.567 kg (168 lb 12.8 oz)   SpO2: 97% 93% 97% 100%    Intake/Output from previous day:   Intake/Output Summary (Last 24 hours) at 05/29/11 1850 Last data filed at 05/29/11 0849  Gross per 24 hour  Intake    680 ml  Output   1165 ml  Net   -485 ml    Physical Exam: General: Alert, awake, oriented x3, in no acute distress.  HEENT: No bruits, no goiter. Moist mucous membranes, no scleral icterus, no conjunctival pallor.  Heart: Regular rhythm but tachycardic, without murmurs, rubs, gallops.  Lungs: Clear to auscultation bilaterally. No wheezing, no rhonchi, no rales.  Abdomen: 2 right abdominal drains. Midline wound clean. Drainage bag over jejunostomy tube site.  Extremities: No clubbing cyanosis or edema, positive pedal pulses.  Neuro: Grossly intact, nonfocal.  Lab Results:  Basic Metabolic Panel:    Component Value Date/Time   NA 127* 05/29/2011 0505   K 4.1 05/29/2011 0505   CL 93* 05/29/2011 0505   CO2 25 05/29/2011 0505   BUN 16 05/29/2011 0505   CREATININE 0.48* 05/29/2011 0505   GLUCOSE 146* 05/29/2011 0505   CALCIUM 8.2* 05/29/2011 0505   CBC:    Component Value Date/Time   WBC 8.0 05/29/2011 0505   HGB 7.9* 05/29/2011 0505   HCT 25.1* 05/29/2011 0505   PLT 421* 05/29/2011 0505   MCV 81.8 05/29/2011 0505   NEUTROABS 3.8 05/25/2011 0530   LYMPHSABS 1.3 05/25/2011 0530   MONOABS 0.4 05/25/2011 0530   EOSABS 0.1 05/25/2011 0530   BASOSABS 0.1 05/25/2011 0530       Lab 05/29/11 0505 05/28/11 0734 05/25/11 0530 05/23/11 0535  WBC 8.0 8.1 5.7 7.8  HGB 7.9* 8.2* 10.0* 8.3*  HCT 25.1* 26.2* 31.1* 26.1*  PLT 421* 441* 246 292  MCV 81.8 81.9 81.6 81.3  MCH 25.7* 25.6* 26.2 25.9*  MCHC 31.5 31.3 32.2 31.8  RDW 21.1* 21.2* 21.4* 20.6*  LYMPHSABS -- -- 1.3 --  MONOABS -- -- 0.4 --  EOSABS -- -- 0.1 --  BASOSABS -- -- 0.1 --  BANDABS -- -- -- --    Lab 05/29/11 0505 05/28/11 2000 05/28/11 0734 05/27/11 0520 05/25/11 0530 05/23/11 0535  NA 127* 126* 126* 130* 133* --  K 4.1 4.3 4.1 4.4 3.9 --  CL 93* 93* 92* 95* 101 --  CO2 25 25 27 25 24  --  GLUCOSE 146* 137* 143* 127* 167* --  BUN 16 16 16 15 13  --  CREATININE 0.48* 0.52 0.48* 0.52 0.45* --  CALCIUM 8.2* 8.0* 8.2* 8.3* 8.1* --  MG 1.6 -- -- 1.6 1.4* 1.6   No results found for this basename: INR:5,PROTIME:5 in the last 168 hours Cardiac markers: No results found for this basename: CK:3,CKMB:3,TROPONINI:3,MYOGLOBIN:3 in the last 168 hours No results found for this basename: POCBNP:3 in the last 168 hours No results found  for this or any previous visit (from the past 240 hour(s)).  Studies/Results: No results found.  Medications: Scheduled Meds:   . heparin subcutaneous  5,000 Units Subcutaneous Q8H  . insulin aspart  0-15 Units Subcutaneous Q4H  . insulin aspart  0-15 Units Subcutaneous Custom  . lip balm  1 application Topical BID  . magnesium sulfate infusion  2 g Intravenous Once  . pantoprazole (PROTONIX) IV  40 mg Intravenous Q12H   Continuous Infusions:   . sodium chloride 50 mL/hr at 05/29/11 0727  . TPN (CLINIMIX) +/- additives     And  . fat emulsion 234 mL (05/29/11 1737)  . TPN (CLINIMIX) +/- additives 80 mL/hr at 05/28/11 2000  . DISCONTD: sodium chloride 0.45 % with kcl 15 mL/hr at 05/27/11 0800   PRN Meds:.acetaminophen, hydrALAZINE, LORazepam, morphine injection, phenol, sodium chloride  Assessment/Plan:  Principal Problem:  *Abdominal pain, acute  -  persistent pain but somewhat improved - worrisome for an infectious etiology given low grade fever and ? Bleed given drop in Hg - would like to get repeat CT abd/pelvis with contrast for further evaluation  Active Problems:  DIABETES MELLITUS, TYPE II  - well controlled  - continue SSI   HYPERTENSION  - remains stable and at goal   Anemia  - Hg dropped over the past 2 days 10 --> 8.2 --> 7.9 - obtain CBC in AM  Hyponatremia  - unclear and possibly related to volume depletion  - will continue gentle hydration and obtain BMP in AM   Metabolic acidosis  - resolved   Esophagitis, erosive  - continue Protonix   Acute respiratory failure  - resolved   Disposition  - ? Placement to LTAC pending decision    LOS: 23 days   MAGICK-Konnor Jorden 05/29/2011, 6:50 PM

## 2011-05-29 NOTE — Progress Notes (Addendum)
PARENTERAL NUTRITION CONSULT NOTE - FOLLOW UP  Pharmacy Consult for TPN Indication: Bowel rest/NPO, Not tolerating TF and J-tube out. Evidence of jejunum/ileum fistula. s/p gastrectomy, gastrostomy, cholecysectomy for erosive esophagitis and perforated ulcer.   Allergies  Allergen Reactions  . Other Swelling    Pinto beans cause swelling and hives  . Penicillins Swelling    Patient Measurements: Height: 5\' 6"  (167.6 cm) Weight: 168 lb 12.8 oz (76.567 kg) IBW/kg (Calculated) : 59.3    Vital Signs: Temp: 99.9 F (37.7 C) (11/30 0504) Temp src: Oral (11/30 0504) BP: 125/75 mmHg (11/30 0504) Pulse Rate: 111  (11/30 0504) Intake/Output from previous day: 11/29 0701 - 11/30 0700 In: 680 [I.V.:600; TPN:80] Out: 1405 [Urine:950; Drains:55; Stool:400]    Labs:  [facility] 05/29/11 0505 05/28/11 0734  WBC 8.0 8.1  HGB 7.9* 8.2*  HCT 25.1* 26.2*  PLT 421* 441*  APTT -- --  INR -- --     Basename 05/29/11 0505 05/28/11 2000 05/28/11 0734 05/27/11 0520  NA 127* 126* 126* --  K 4.1 4.3 4.1 --  CL 93* 93* 92* --  CO2 25 25 27  --  GLUCOSE 146* 137* 143* --  BUN 16 16 16  --  CREATININE 0.48* 0.52 0.48* --  LABCREA -- -- -- --  CREAT24HRUR -- -- -- --  CALCIUM 8.2* 8.0* 8.2* --  MG 1.6 -- -- 1.6  PHOS 4.0 -- -- --  PROT 6.3 -- -- --  ALBUMIN 1.8* -- -- --  AST 15 -- -- --  ALT 9 -- -- --  ALKPHOS 92 -- -- --  BILITOT 0.2* -- -- --  BILIDIR -- -- -- --  IBILI -- -- -- --  PREALBUMIN -- -- -- --  CHOLHDL -- -- -- --  CHOL -- -- -- --   Estimated Creatinine Clearance: 61.5 ml/min (by C-G formula based on Cr of 0.48).    Basename 05/29/11 0403 05/28/11 2357 05/28/11 2054  GLUCAP 137* 151* 144*    Medications:  Scheduled:     . heparin subcutaneous  5,000 Units Subcutaneous Q8H  . insulin aspart  0-15 Units Subcutaneous Q4H  . lip balm  1 application Topical BID  . pantoprazole (PROTONIX) IV  40 mg Intravenous Q12H   Infusions:     . sodium chloride 50  mL/hr at 05/29/11 0727  . TPN (CLINIMIX) +/- additives 80 mL/hr at 05/27/11 1824   And  . fat emulsion 250 mL (05/27/11 1825)  . TPN (CLINIMIX) +/- additives 80 mL/hr at 05/28/11 2000  . DISCONTD: sodium chloride 50 mL/hr at 05/27/11 1500  . DISCONTD: dextrose 50 mL/hr at 05/28/11 0853  . DISCONTD: sodium chloride 0.45 % with kcl 15 mL/hr at 05/27/11 0800     Assessment:  77 y/o previously on TPN (for erosive esophagitis with perforated pyloric ulcer s/p lap. 11/7 with gastrostomy, gastrectomy and cholecystectomy) TPN initially started 05/12/11 - 05/14/2011. Resumed TPN 05/19/11 for increased J-tube output and and J-tube displacement. Awaiting J tube site closure. 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  Noted plan for long-term support of TNA so will start cycling TNA. 1.  Endo - CBGs controlled with 50 units insulin in TNA.  Pt received 7 units of SSI. 2.  Lytes - stable except low Na+/Cl-.  MD aware lytes could not be corrected with premade Clinimix.  Mag at low end of goal today, so will supplement. 3.  Renal fxn stable, negative I/O's, on NS at 50 ml/hr.  Nutritional Goals:  1785-2000 kcal and 95-110 g protein   Plan:  1.  Cycle Clinimix E 5/15 and lipids over 18 hours. 2.  Adjust SSI schedule for cyclic TNA 3.  Magnesium sulfate 2gm IV x 1. 4.  Due to national backorder, lipids, MVI, and trace elements will be given MWF only. 5.  F/U AM labs and tolerance of 18-hr cyclic TNA.   [name], [name] 05/29/2011,8:16 AM

## 2011-05-30 LAB — URINE MICROSCOPIC-ADD ON

## 2011-05-30 LAB — BASIC METABOLIC PANEL
CO2: 22 mEq/L (ref 19–32)
Calcium: 7.8 mg/dL — ABNORMAL LOW (ref 8.4–10.5)
Creatinine, Ser: 0.51 mg/dL (ref 0.50–1.10)
Glucose, Bld: 172 mg/dL — ABNORMAL HIGH (ref 70–99)
Sodium: 123 mEq/L — ABNORMAL LOW (ref 135–145)

## 2011-05-30 LAB — URINALYSIS, ROUTINE W REFLEX MICROSCOPIC
Glucose, UA: NEGATIVE mg/dL
Protein, ur: NEGATIVE mg/dL

## 2011-05-30 LAB — CBC
HCT: 25 % — ABNORMAL LOW (ref 36.0–46.0)
Hemoglobin: 8 g/dL — ABNORMAL LOW (ref 12.0–15.0)
MCHC: 32 g/dL (ref 30.0–36.0)
MCV: 81.7 fL (ref 78.0–100.0)

## 2011-05-30 LAB — GLUCOSE, CAPILLARY
Glucose-Capillary: 138 mg/dL — ABNORMAL HIGH (ref 70–99)
Glucose-Capillary: 152 mg/dL — ABNORMAL HIGH (ref 70–99)

## 2011-05-30 MED ORDER — INSULIN ASPART 100 UNIT/ML ~~LOC~~ SOLN
0.0000 [IU] | Freq: Four times a day (QID) | SUBCUTANEOUS | Status: DC
  Administered 2011-05-31 – 2011-06-01 (×3): 1 [IU] via SUBCUTANEOUS
  Administered 2011-06-01: 2 [IU] via SUBCUTANEOUS
  Administered 2011-06-01 – 2011-06-03 (×8): 1 [IU] via SUBCUTANEOUS
  Administered 2011-06-03: 2 [IU] via SUBCUTANEOUS
  Administered 2011-06-03 – 2011-06-07 (×11): 1 [IU] via SUBCUTANEOUS

## 2011-05-30 MED ORDER — WHITE PETROLATUM GEL
Status: AC
  Administered 2011-05-30: 17:00:00
  Filled 2011-05-30: qty 5

## 2011-05-30 MED ORDER — INSULIN ASPART 100 UNIT/ML ~~LOC~~ SOLN
0.0000 [IU] | SUBCUTANEOUS | Status: AC
  Administered 2011-05-30: 2 [IU] via SUBCUTANEOUS

## 2011-05-30 MED ORDER — ALTEPLASE 2 MG IJ SOLR
2.0000 mg | Freq: Once | INTRAMUSCULAR | Status: AC
  Administered 2011-05-31: 2 mg
  Filled 2011-05-30: qty 2

## 2011-05-30 MED ORDER — CIPROFLOXACIN IN D5W 400 MG/200ML IV SOLN
400.0000 mg | Freq: Two times a day (BID) | INTRAVENOUS | Status: DC
  Administered 2011-05-30 – 2011-06-02 (×7): 400 mg via INTRAVENOUS
  Filled 2011-05-30 (×11): qty 200

## 2011-05-30 MED ORDER — IOHEXOL 300 MG/ML  SOLN
100.0000 mL | Freq: Once | INTRAMUSCULAR | Status: AC | PRN
  Administered 2011-05-30: 100 mL via INTRAVENOUS

## 2011-05-30 MED ORDER — INSULIN REGULAR HUMAN 100 UNIT/ML IJ SOLN
INTRAVENOUS | Status: AC
  Administered 2011-05-30: 18:00:00 via INTRAVENOUS
  Filled 2011-05-30: qty 2000

## 2011-05-30 NOTE — Progress Notes (Signed)
23 Days Post-Op  Subjective: More fever, WBC is up again, more drainage from drain #2, On Primaxin, Vancomycin, and Cipro ordered. CT  Repeated early this AM Objective: Vital signs in last 24 hours: Temp:  [100.3 F (37.9 C)-101.9 F (38.8 C)] 100.8 F (38.2 C) (12/01 0616) Pulse Rate:  [94-117] 94  (12/01 0616) Resp:  [16-18] 16  (12/01 0616) BP: (123-134)/(72-85) 124/72 mmHg (12/01 0616) SpO2:  [94 %-100 %] 94 % (12/01 0616) Weight:  [77.2 kg (170 lb 3.1 oz)] 170 lb 3.1 oz (77.2 kg) (12/01 0500) Last BM Date: 05/30/11  Intake/Output from previous day: 11/30 0701 - 12/01 0700 In: 2260.2 [I.V.:530.8; TPN:1729.3] Out: 2320 [Urine:2000; Drains:120; Stool:200] Intake/Output this shift:    General appearance: She's a bit lethargic, slow mentation, says she doesn't feel any better.  Very tired and worn out. Resp: clear to auscultation bilaterally GI: Not really distended, increased drainage from drain @ which is purulent looking.Marland Kitchen No change in appearance just increase in volume.  Lab Results:   Basename 05/30/11 0500 05/29/11 0505  WBC 13.5* 8.0  HGB 8.0* 7.9*  HCT 25.0* 25.1*  PLT 425* 421*    BMET  Basename 05/30/11 0500 05/29/11 0505  NA 123* 127*  K 3.9 4.1  CL 93* 93*  CO2 22 25  GLUCOSE 172* 146*  BUN 17 16  CREATININE 0.51 0.48*  CALCIUM 7.8* 8.2*   PT/INR No results found for this basename: LABPROT:2,INR:2 in the last 72 hours   Studies/Results: Ct Abdomen Pelvis W Contrast  05/30/2011  *RADIOLOGY REPORT*  Clinical Data: Recent exploratory laparotomy, large pyloric ulcer with perforated.  CT ABDOMEN AND PELVIS WITH CONTRAST  Technique:  Multidetector CT imaging of the abdomen and pelvis was performed following the standard protocol during bolus administration of intravenous contrast.  Contrast: OMNIPAQUE IOHEXOL 300 MG/ML IV SOLN  Comparison: CT 05/07/2011  Findings: There is a gastrostomy tube within the stomach. Postsurgical change consistent with  partial gastrectomy.  There is a percutaneous drain in the right abdominal wall which extends to the left upper quadrant inferior to the stomach.  A second surgical drain extends inferiorly and superiorly with tip in the left upper quadrant adjacent to the spleen.  There is a small fluid collection adjacent to the left lateral margin of the liver which measures 7.1 x 4.0 cm.  This is adjacent the left lesser curvature of the stomach and does have a thin rim of enhancement.(image 26).  This is similar to prior the collection measures 6.4 x 4.9 cm.  There is a 3.7 x 4.2 cm collection which has a small amount interspersed gas at the surgical site along the left hepatic margin.  This is decreased from 4.5 x 4.6 cm on prior. There is additional fluid collection lateral to the stomach measuring 2.5 x 3.2 cm (image 31) which is increased from 1.7 x 3.0 cm. There is a tiny 6 mm focus of gas, lateral to the duodenum,  which is clearly seen on prior (image 34).  There is mild bibasilar atelectasis and small effusions.  No pericardial fluid.  No focal hepatic lesion.  The gallbladder is absent.  Pancreas is normal.  The spleen is normal.  Adrenal glands and kidneys are normal.  No evidence of bowel obstruction.  There is diverticula the sigmoid colon.  Abdominal aorta normal caliber.  No free fluid the pelvis.  Foley catheter in the bladder.  Uterus is normal. Review of  bone windows demonstrates no aggressive osseous lesions.  IMPRESSION:  1.  Fluid collection with interspersed gas along the surgical site posterior to the left hepatic lobe is decreased slightly in size.  2.  Fluid collection lateral to the left hepatic lobe is not changed. 3.  Fluid collection lateral to the stomach is slightly increased in size.  4.  Tiny focus of gas and at the porta hepatis is not clearly seen on prior.  Original Report Authenticated By: Genevive Bi, M.D.   Dg Chest Port 1 View  05/29/2011  *RADIOLOGY REPORT*  Clinical Data:  Febrile.  Low oxygen saturations.  PORTABLE CHEST - 1 VIEW  Comparison: Chest radiograph 05/19/2011  Findings: Right upper extremity PICC terminates in the distal superior vena cava.  Heart size within normal limits portable technique.  Mediastinal contour stable.  Lung volumes are low bilaterally with linear atelectasis at the right lung base.  There is peribronchial thickening.  No visible airspace disease or pleural effusion.  Osteopenia.  IMPRESSION: Low lung volumes with right basilar atelectasis and peribronchial thickening.  Original Report Authenticated By: Britta Mccreedy, M.D.    Anti-infectives: Anti-infectives     Start     Dose/Rate Route Frequency Ordered Stop   05/30/11 1130   ciprofloxacin (CIPRO) IVPB 400 mg        400 mg 200 mL/hr over 60 Minutes Intravenous 2 times daily 05/30/11 1038     05/29/11 2100   imipenem-cilastatin (PRIMAXIN) 500 mg in sodium chloride 0.9 % 100 mL IVPB        500 mg 200 mL/hr over 30 Minutes Intravenous Every 8 hours 05/29/11 2007     05/29/11 2100   vancomycin (VANCOCIN) 1,250 mg in sodium chloride 0.9 % 250 mL IVPB        1,250 mg 166.7 mL/hr over 90 Minutes Intravenous Every 24 hours 05/29/11 2007     05/07/11 1400   imipenem-cilastatin (PRIMAXIN) 500 mg in sodium chloride 0.9 % 100 mL IVPB        500 mg 200 mL/hr over 30 Minutes Intravenous 3 times per day 05/07/11 1106 05/21/11 1359   05/07/11 1200   micafungin (MYCAMINE) 100 mg in sodium chloride 0.9 % 100 mL IVPB  Status:  Discontinued     Comments: PER PHARMACY      100 mg 100 mL/hr over 1 Hours Intravenous Every 24 hours 05/07/11 1032 05/19/11 1103   05/07/11 1200   vancomycin (VANCOCIN) 1,250 mg in sodium chloride 0.9 % 250 mL IVPB  Status:  Discontinued        1,250 mg 166.7 mL/hr over 90 Minutes Intravenous Every 24 hours 05/07/11 1106 05/19/11 1103   05/07/11 1000   micafungin (MYCAMINE) 150 mg in sodium chloride 0.9 % 100 mL IVPB  Status:  Discontinued     Comments: PER PHARMACY        150 mg 100 mL/hr over 1 Hours Intravenous Daily 05/07/11 0951 05/07/11 1029   05/07/11 0300   ertapenem (INVANZ) 1 g in sodium chloride 0.9 % 50 mL IVPB        1 g 100 mL/hr over 30 Minutes Intravenous  Once 05/07/11 0300 05/07/11 0348   05/07/11 0130   cefTRIAXone (ROCEPHIN) 1 g in dextrose 5 % 50 mL IVPB        1 g 100 mL/hr over 30 Minutes Intravenous  Once 05/07/11 0122 05/07/11 0200         Current Facility-Administered Medications  Medication Dose Route Frequency Provider Last Rate Last Dose  . acetaminophen (TYLENOL)  suppository 650 mg  650 mg Rectal Q6H PRN Ardeth Sportsman, MD   650 mg at 05/27/11 1440  . ciprofloxacin (CIPRO) IVPB 400 mg  400 mg Intravenous BID Iskra Magick-Myers      . tpn solution (CLINIMIX E 5/15) 2,000 mL with multivitamins adult 10 mL, trace elements Cr-Cu-Mn-Se-Zn 1 mL, insulin regular 50 Units infusion   Intravenous Cyclic-See Admin Instructions Lennon Alstrom, PHARMD       And  . fat emulsion 20 % infusion 234 mL  234 mL Intravenous Cyclic-See Admin Instructions Lennon Alstrom, [place] 13 mL/hr at 05/29/11 1737 234 mL at 05/29/11 1737  . heparin injection 5,000 Units  5,000 Units Subcutaneous Q8H Daniel J. Feinstein   5,000 Units at 05/30/11 0517  . hydrALAZINE (APRESOLINE) injection 10 mg  10 mg Intravenous Q8H PRN Vassie Loll, MD      . imipenem-cilastatin (PRIMAXIN) 500 mg in sodium chloride 0.9 % 100 mL IVPB  500 mg Intravenous [address] [place], PHARMD   500 mg at 05/30/11 0529  . insulin aspart (novoLOG) injection 0-15 Units  0-15 Units Subcutaneous Q4H Gemma Payor [place], [place]   2 Units at 05/29/11 1732  . insulin aspart (novoLOG) injection 0-15 Units  0-15 Units Subcutaneous Custom Dannielle Huh, PHARMD      . insulin aspart (novoLOG) injection 0-9 Units  0-9 Units Subcutaneous Q6H Dannielle Huh, PHARMD      . iohexol (OMNIPAQUE) 300 MG/ML injection 100 mL  100 mL Intravenous Once PRN Medication Radiologist   100 mL  at 05/30/11 0004  . lip balm (CARMEX) ointment 1 application  1 application Topical BID Ardeth Sportsman, MD   1 application at 05/30/11 1100  . LORazepam (ATIVAN) injection 0.5 mg  0.5 mg Intravenous Q8H PRN Vassie Loll, MD   0.5 mg at 05/21/11 1239  . magnesium sulfate 2 g in dextrose 5 % 250 mL infusion  2 g Intravenous Once Thuy Dien Dang, PHARMD 125 mL/hr at 05/29/11 1028 2 g at 05/29/11 1028  . magnesium sulfate 2 g in dextrose 5 % 250 mL infusion  2 g Intravenous Once Iskra Magick-Myers 125 mL/hr at 05/29/11 2116 2 g at 05/29/11 2116  . morphine 4 MG/ML injection 3 mg  3 mg Intravenous Q3H PRN Iskra Magick-Myers   3 mg at 05/30/11 0518  . pantoprazole (PROTONIX) injection 40 mg  40 mg Intravenous Q12H Ardeth Sportsman, MD   40 mg at 05/30/11 1100  . phenol (CHLORASEPTIC) mouth spray 1 spray  1 spray Mouth/Throat Q6H PRN Vassie Loll, MD      . sodium chloride 0.9 % injection 10 mL  10 mL Intracatheter PRN Wesam Yacoub   10 mL at 05/29/11 1738  . tpn solution (CLINIMIX E 5/15) 2,000 mL with insulin regular 50 Units infusion   Intravenous Continuous TPN Dannielle Huh, PHARMD 80 mL/hr at 05/28/11 2000    . tpn solution (CLINIMIX E 5/15) 2,000 mL with insulin regular 50 Units infusion   Intravenous Continuous TPN Dannielle Huh, PHARMD      . vancomycin (VANCOCIN) 1,250 mg in sodium chloride 0.9 % 250 mL IVPB  1,250 mg Intravenous Q24H Maryanna Shape [place], PHARMD   1,250 mg at 05/30/11 0030  . DISCONTD: 0.9 %  sodium chloride infusion   Intravenous Continuous Iskra Magick-Myers 50 mL/hr at 05/29/11 0727    . DISCONTD: insulin aspart (novoLOG) injection 0-15 Units  0-15 Units Subcutaneous Custom [address], [place]  3 Units at 05/30/11 0517    Assessment/Plan POD 23, EXP lap,lysis of adhesions, antrectomy, duodenal resection,B2,Jejunostomy, gastrotomy. 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  Increased fever and WBC.  I will talk with  DR.Streck, and ask him to review CT.   LOS: 24 days    Ricard Faulkner 05/30/2011

## 2011-05-30 NOTE — Progress Notes (Signed)
Patient ID: [name], female   DOB: 1932-03-19, 77 y.o.   MRN: [id number] Subjective: No events overnight. Patient denies chest pain, shortness of breath, abdominal pain.  Objective:  Vital signs in last 24 hours:  Filed Vitals:   05/29/11 2235 05/30/11 0001 05/30/11 0500 05/30/11 0616  BP: 123/74   124/72  Pulse: 117   94  Temp: 101.9 F (38.8 C) 100.4 F (38 C)  100.8 F (38.2 C)  TempSrc:      Resp: 16   16  Height:      Weight:   77.2 kg (170 lb 3.1 oz)   SpO2: 99%   94%    Intake/Output from previous day:   Intake/Output Summary (Last 24 hours) at 05/30/11 1042 Last data filed at 05/30/11 0700  Gross per 24 hour  Intake 2260.16 ml  Output   1620 ml  Net 640.16 ml    Physical Exam: General: Alert, awake, oriented to name, in no acute distress HEENT: no bruits, no goiter. Moist mucous membranes, no scleral icterus, no conjunctival pallor. Heart: Regular rhythm, tachycardic without murmurs, rubs, gallops. Lungs: Clear to auscultation bilaterally. Bibasilar crackles. No wheezing, no rhonchi, no rales.  Abdomen: Soft, nontender, nondistended, positive bowel sounds. Extremities: No clubbing cyanosis, trace bilateral pitting edema noted,  positive pedal pulses. Neuro: Grossly intact, nonfocal.  Lab Results:  Basic Metabolic Panel:    Component Value Date/Time   NA 123* 05/30/2011 0500   K 3.9 05/30/2011 0500   CL 93* 05/30/2011 0500   CO2 22 05/30/2011 0500   BUN 17 05/30/2011 0500   CREATININE 0.51 05/30/2011 0500   GLUCOSE 172* 05/30/2011 0500   CALCIUM 7.8* 05/30/2011 0500   CBC:    Component Value Date/Time   WBC 13.5* 05/30/2011 0500   HGB 8.0* 05/30/2011 0500   HCT 25.0* 05/30/2011 0500   PLT 425* 05/30/2011 0500   MCV 81.7 05/30/2011 0500   NEUTROABS 3.8 05/25/2011 0530   LYMPHSABS 1.3 05/25/2011 0530   MONOABS 0.4 05/25/2011 0530   EOSABS 0.1 05/25/2011 0530   BASOSABS 0.1 05/25/2011 0530      Lab 05/30/11 0500 05/29/11 0505 05/28/11 0734 05/25/11  0530  WBC 13.5* 8.0 8.1 5.7  HGB 8.0* 7.9* 8.2* 10.0*  HCT 25.0* 25.1* 26.2* 31.1*  PLT 425* 421* 441* 246  MCV 81.7 81.8 81.9 81.6  MCH 26.1 25.7* 25.6* 26.2  MCHC 32.0 31.5 31.3 32.2  RDW 20.9* 21.1* 21.2* 21.4*  LYMPHSABS -- -- -- 1.3  MONOABS -- -- -- 0.4  EOSABS -- -- -- 0.1  BASOSABS -- -- -- 0.1  BANDABS -- -- -- --    Lab 05/30/11 0500 05/29/11 0505 05/28/11 2000 05/28/11 0734 05/27/11 0520 05/25/11 0530  NA 123* 127* 126* 126* 130* --  K 3.9 4.1 4.3 4.1 4.4 --  CL 93* 93* 93* 92* 95* --  CO2 22 25 25 27 25  --  GLUCOSE 172* 146* 137* 143* 127* --  BUN 17 16 16 16 15  --  CREATININE 0.51 0.48* 0.52 0.48* 0.52 --  CALCIUM 7.8* 8.2* 8.0* 8.2* 8.3* --  MG 2.0 1.6 -- -- 1.6 1.4*   No results found for this basename: INR:5,PROTIME:5 in the last 168 hours Cardiac markers: No results found for this basename: CK:3,CKMB:3,TROPONINI:3,MYOGLOBIN:3 in the last 168 hours No results found for this basename: POCBNP:3 in the last 168 hours No results found for this or any previous visit (from the past 240 hour(s)).  Studies/Results: Ct Abdomen Pelvis  W Contrast 05/30/2011   IMPRESSION:  1.  Fluid collection with interspersed gas along the surgical site posterior to the left hepatic lobe is decreased slightly in size.  2.  Fluid collection lateral to the left hepatic lobe is not changed. 3.  Fluid collection lateral to the stomach is slightly increased in size.  4.  Tiny focus of gas and at the porta hepatis is not clearly seen on prior.    Dg Chest Port 1 View 05/29/2011   IMPRESSION: Low lung volumes with right basilar atelectasis and peribronchial thickening.   Medications: Scheduled Meds:   . ciprofloxacin  400 mg Intravenous BID  . heparin subcutaneous  5,000 Units Subcutaneous Q8H  . imipenem-cilastatin  500 mg Intravenous Q8H  . insulin aspart  0-15 Units Subcutaneous Q4H  . insulin aspart  0-15 Units Subcutaneous Custom  . insulin aspart  0-9 Units Subcutaneous Q6H  . lip  balm  1 application Topical BID  . magnesium sulfate infusion  2 g Intravenous Once  . magnesium sulfate infusion  2 g Intravenous Once  . pantoprazole (PROTONIX) IV  40 mg Intravenous Q12H  . vancomycin  1,250 mg Intravenous Q24H  . DISCONTD: insulin aspart  0-15 Units Subcutaneous Custom   Continuous Infusions:   . TPN (CLINIMIX) +/- additives     And  . fat emulsion 234 mL (05/29/11 1737)  . TPN (CLINIMIX) +/- additives 80 mL/hr at 05/28/11 2000  . TPN (CLINIMIX) +/- additives    . DISCONTD: sodium chloride 50 mL/hr at 05/29/11 0727   PRN Meds:.acetaminophen, hydrALAZINE, iohexol, LORazepam, morphine injection, phenol, sodium chloride  Assessment/Plan:  Principal Problem:  *Abdominal pain, acute - now clinically improving, CT with no new and acute findings post op  Active Problems:  DIABETES MELLITUS, TYPE II - well controlled on current medication regimen   HYPERTENSION - at goal   Anemia - Hg/Hct dropped but now remain stable at 8; will obtain CBC in AM   Hyponatremia - unsure if related to TPN, will fluid restric and d/c IVF   Metabolic acidosis - resolved   Esophagitis, erosive - continue protonix   Acute respiratory failure - resolved but low lung volumes still noted on CXR   UTI - start Cipro for now    LOS: 24 days   MAGICK-Kross Swallows 05/30/2011, 10:42 AM

## 2011-05-30 NOTE — Progress Notes (Signed)
Abdomen seems benign - CT noted - will review with radiologist to see if any areas can be drained, or if current drains need adjusting

## 2011-05-30 NOTE — Progress Notes (Addendum)
PARENTERAL NUTRITION CONSULT NOTE - FOLLOW UP  Pharmacy Consult for TPN  Indication: Bowel rest/NPO, Not tolerating TF and J-tube out. Evidence of jejunum/ileum fistula. s/p gastrectomy, gastrostomy, cholecysectomy for erosive esophagitis and perforated ulcer.    Allergies  Allergen Reactions  . Other Swelling    Pinto beans cause swelling and hives  . Penicillins Swelling    Patient Measurements: Height: 5\' 6"  (167.6 cm) Weight: 170 lb 3.1 oz (77.2 kg) IBW/kg (Calculated) : 59.3   Vital Signs: Temp: 100.8 F (38.2 C) (12/01 0616) BP: 124/72 mmHg (12/01 0616) Pulse Rate: 94  (12/01 0616) Intake/Output from previous day: 11/30 0701 - 12/01 0700 In: 2260.2 [I.V.:530.8; TPN:1729.3] Out: 2320 [Urine:2000; Drains:120; Stool:200]    Labs:  [facility] 05/30/11 0500 05/29/11 0505 05/28/11 0734  WBC 13.5* 8.0 8.1  HGB 8.0* 7.9* 8.2*  HCT 25.0* 25.1* 26.2*  PLT 425* 421* 441*  APTT -- -- --  INR -- -- --     Basename 05/30/11 0500 05/29/11 0505 05/28/11 2000  NA 123* 127* 126*  K 3.9 4.1 4.3  CL 93* 93* 93*  CO2 22 25 25   GLUCOSE 172* 146* 137*  BUN 17 16 16   CREATININE 0.51 0.48* 0.52  LABCREA -- -- --  CREAT24HRUR -- -- --  CALCIUM 7.8* 8.2* 8.0*  MG 2.0 1.6 --  PHOS -- 4.0 --  PROT -- 6.3 --  ALBUMIN -- 1.8* --  AST -- 15 --  ALT -- 9 --  ALKPHOS -- 92 --  BILITOT -- 0.2* --  BILIDIR -- -- --  IBILI -- -- --  PREALBUMIN -- -- --  CHOLHDL -- -- --  CHOL -- -- --   Estimated Creatinine Clearance: 61.8 ml/min (by C-G formula based on Cr of 0.51).    Basename 05/30/11 0515 05/30/11 0006 05/29/11 2037  GLUCAP 159* 180* 119*    Medications:  Scheduled:    . heparin subcutaneous  5,000 Units Subcutaneous Q8H  . imipenem-cilastatin  500 mg Intravenous Q8H  . insulin aspart  0-15 Units Subcutaneous Q4H  . insulin aspart  0-15 Units Subcutaneous Custom  . lip balm  1 application Topical BID  . magnesium sulfate infusion  2 g Intravenous Once  . magnesium  sulfate infusion  2 g Intravenous Once  . pantoprazole (PROTONIX) IV  40 mg Intravenous Q12H  . vancomycin  1,250 mg Intravenous Q24H   Infusions:    . sodium chloride 50 mL/hr at 05/29/11 0727  . TPN (CLINIMIX) +/- additives     And  . fat emulsion 234 mL (05/29/11 1737)  . TPN (CLINIMIX) +/- additives 80 mL/hr at 05/28/11 2000    Insulin Requirements in the past 24 hours:  8 Units Novolog SSI, 50 units regular insulin in TPN  Assessment:  77 y/o previously on TPN (for erosive esophagitis with perforated pyloric ulcer s/p lap. 11/7 with gastrostomy, gastrectomy and cholecystectomy) TPN initially started 05/12/11 - 05/14/2011. Resumed TPN 05/19/11 for increased J-tube output and and J-tube displacement. Awaiting J tube site closure. 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 Noted plan for long-term support of TNA, TNA started cycling  last night.  1. Endo - CBGs have increased during peak cycling rates.    2. Lytes - low Na+/Cl- (decreased from yday. MD aware lytes could not be corrected with premade Clinimix. Mag better today. 3. Renal fxn stable, negative I/O's, on NS at 50 ml/hr.   Nutritional Goals:  1785-2000 kcal and 95-110 g protein  Plan:  1. Change back to continuous 24h TPN, ? Increase rate/fluid during 18h cycle has contributed to lowe Na.   2. Na remains low.  IVF now NS.  Unable to adjust Na content in TPN.   3. Due to national backorder, lipids, MVI, and trace elements will be given MWF only.  4. F/U AM labs   Dannielle Huh 05/30/2011,8:46 AM

## 2011-05-31 ENCOUNTER — Encounter (HOSPITAL_COMMUNITY): Payer: Self-pay | Admitting: Physician Assistant

## 2011-05-31 ENCOUNTER — Inpatient Hospital Stay (HOSPITAL_COMMUNITY): Payer: Medicare HMO

## 2011-05-31 LAB — GLUCOSE, CAPILLARY
Glucose-Capillary: 103 mg/dL — ABNORMAL HIGH (ref 70–99)
Glucose-Capillary: 141 mg/dL — ABNORMAL HIGH (ref 70–99)
Glucose-Capillary: 150 mg/dL — ABNORMAL HIGH (ref 70–99)

## 2011-05-31 LAB — BASIC METABOLIC PANEL
Chloride: 95 mEq/L — ABNORMAL LOW (ref 96–112)
GFR calc Af Amer: >90 mL/min (ref >90)
GFR calc non Af Amer: >90 mL/min (ref >90)
Potassium: 3.5 mEq/L (ref 3.5–5.1)
Sodium: 125 mEq/L — ABNORMAL LOW (ref 135–145)

## 2011-05-31 LAB — CARDIAC PANEL(CRET KIN+CKTOT+MB+TROPI): Relative Index: INVALID (ref 0.0–2.5)

## 2011-05-31 LAB — CBC
Hemoglobin: 7.3 g/dL — ABNORMAL LOW (ref 12.0–15.0)
RBC: 2.82 MIL/uL — ABNORMAL LOW (ref 3.87–5.11)
WBC: 8.9 10*3/uL (ref 4.0–10.5)

## 2011-05-31 LAB — PREPARE RBC (CROSSMATCH)

## 2011-05-31 MED ORDER — FENTANYL CITRATE 0.05 MG/ML IJ SOLN
INTRAMUSCULAR | Status: AC | PRN
  Administered 2011-05-31: 100 ug via INTRAVENOUS

## 2011-05-31 MED ORDER — POTASSIUM CHLORIDE 10 MEQ/50ML IV SOLN
10.0000 meq | INTRAVENOUS | Status: AC
  Administered 2011-05-31 (×2): 10 meq via INTRAVENOUS
  Filled 2011-05-31 (×2): qty 50

## 2011-05-31 MED ORDER — MIDAZOLAM HCL 5 MG/5ML IJ SOLN
INTRAMUSCULAR | Status: AC | PRN
  Administered 2011-05-31: 2 mg via INTRAVENOUS

## 2011-05-31 MED ORDER — METOPROLOL TARTRATE 1 MG/ML IV SOLN
5.0000 mg | Freq: Three times a day (TID) | INTRAVENOUS | Status: DC | PRN
  Filled 2011-05-31: qty 5

## 2011-05-31 MED ORDER — INSULIN REGULAR HUMAN 100 UNIT/ML IJ SOLN
INTRAVENOUS | Status: AC
  Administered 2011-05-31: 18:00:00 via INTRAVENOUS
  Filled 2011-05-31: qty 2000

## 2011-05-31 NOTE — Procedures (Signed)
Successful 12 fr subxiphoid abdominal abscess drain 20 cc pus aspirated Sample sent GS and CX No comp Stable Connected to suction bulb

## 2011-05-31 NOTE — Progress Notes (Signed)
Patient ID: [name], female   DOB: Nov 23, 1931, 77 y.o.   MRN: [id number] 24 Days Post-Op  Subjective: Back from IR placement of new drain subxyphoid intraabdominal abscess..  She tells me she has some pain now and then, but isn't really uncomfortable now. Drainage from new drain is just like the drain from #2.    Objective: Vital signs in last 24 hours: Temp:  [97.6 F (36.4 C)-98.8 F (37.1 C)] 97.6 F (36.4 C) (12/02 0630) Pulse Rate:  [115-133] 127  (12/02 1235) Resp:  [16-32] 29  (12/02 1235) BP: (106-158)/(49-97) 143/97 mmHg (12/02 1235) SpO2:  [97 %-100 %] 99 % (12/02 1235) Last BM Date: 05/30/11  Intake/Output from previous day: 12/01 0701 - 12/02 0700 In: 650 [P.O.:450; IV Piggyback:200] Out: 2265 [Urine:2150; Drains:115] Intake/Output this shift:   Exam: Watching TV, Alert, feels better t;han yesterday.  Drains/abdomen: as before.  She isn't having any pain right now.  Lungs clear to ascultation. LE: No edema.  Lab Results:   Basename 05/31/11 0613 05/30/11 0500  WBC 8.9 13.5*  HGB 7.3* 8.0*  HCT 22.9* 25.0*  PLT 393 425*    BMET  Basename 05/31/11 0613 05/30/11 0500  NA 125* 123*  K 3.5 3.9  CL 95* 93*  CO2 22 22  GLUCOSE 143* 172*  BUN 16 17  CREATININE 0.49* 0.51  CALCIUM 8.0* 7.8*   PT/INR No results found for this basename: LABPROT:2,INR:2 in the last 72 hours   Studies/Results: Ct Guided Abscess Drain  05/31/2011  *RADIOLOGY REPORT*  Clinical Data: Intra-abdominal sub xyphoid abscess postoperatively  CT GUIDED INTRA-ABDOMINAL ABSCESS DRAINAGE  Date:  05/31/2011 11:23:00  Radiologist:  Judie Petit. Ruel Favors, M.D.  Medications:  2 mg Versed, 100 mcg Fentanyl  Guidance:  CT  Fluoroscopy time:  None.  Sedation time:  18 minutes  Contrast volume:  None.  Complications:  No immediate  PROCEDURE/FINDINGS:  Informed consent was obtained from the patient following explanation of the procedure, risks, benefits and alternatives. The patient understands, agrees  and consents for the procedure. All questions were addressed.  A time out was performed.  Maximal barrier sterile technique utilized including caps, mask, sterile gowns, sterile gloves, large sterile drape, hand hygiene, and betadine  Previous imaging was reviewed.  The midline sub xyphoid intra- abdominal abscess was localized with the patient supine utilizing noncontrast CT.  Under sterile conditions and local anesthesia, an 18 gauge 10 cm access needle was advanced from an anterior oblique approach into the fluid collection.  Syringe aspiration yielded purulent fluid consistent with an abscess.  Guide wire advanced. Guide wire position confirmed with CT.  Tract dilatation performed to advance a 12-French drain with the retention loop formed in the cavity.  Syringe aspiration yielded 20 ml of thick purulent fluid consistent with an abscess.  Sample sent for Gram stain and culture.  Catheter secured with a Prolene suture and connected to external suction bulb.  Sterile dressing applied.  IMPRESSION: Successful CT guided sub xyphoid intra-abdominal abscess drain placement.  Original Report Authenticated By: Judie Petit. Ruel Favors, M.D.   Ct Abdomen Pelvis W Contrast  05/30/2011  *RADIOLOGY REPORT*  Clinical Data: Recent exploratory laparotomy, large pyloric ulcer with perforated.  CT ABDOMEN AND PELVIS WITH CONTRAST  Technique:  Multidetector CT imaging of the abdomen and pelvis was performed following the standard protocol during bolus administration of intravenous contrast.  Contrast: OMNIPAQUE IOHEXOL 300 MG/ML IV SOLN  Comparison: CT 05/07/2011  Findings: There is a gastrostomy tube within  the stomach. Postsurgical change consistent with partial gastrectomy.  There is a percutaneous drain in the right abdominal wall which extends to the left upper quadrant inferior to the stomach.  A second surgical drain extends inferiorly and superiorly with tip in the left upper quadrant adjacent to the spleen.  There is a  small fluid collection adjacent to the left lateral margin of the liver which measures 7.1 x 4.0 cm.  This is adjacent the left lesser curvature of the stomach and does have a thin rim of enhancement.(image 26).  This is similar to prior the collection measures 6.4 x 4.9 cm.  There is a 3.7 x 4.2 cm collection which has a small amount interspersed gas at the surgical site along the left hepatic margin.  This is decreased from 4.5 x 4.6 cm on prior. There is additional fluid collection lateral to the stomach measuring 2.5 x 3.2 cm (image 31) which is increased from 1.7 x 3.0 cm. There is a tiny 6 mm focus of gas, lateral to the duodenum,  which is clearly seen on prior (image 34).  There is mild bibasilar atelectasis and small effusions.  No pericardial fluid.  No focal hepatic lesion.  The gallbladder is absent.  Pancreas is normal.  The spleen is normal.  Adrenal glands and kidneys are normal.  No evidence of bowel obstruction.  There is diverticula the sigmoid colon.  Abdominal aorta normal caliber.  No free fluid the pelvis.  Foley catheter in the bladder.  Uterus is normal. Review of  bone windows demonstrates no aggressive osseous lesions.  IMPRESSION:  1.  Fluid collection with interspersed gas along the surgical site posterior to the left hepatic lobe is decreased slightly in size.  2.  Fluid collection lateral to the left hepatic lobe is not changed. 3.  Fluid collection lateral to the stomach is slightly increased in size.  4.  Tiny focus of gas and at the porta hepatis is not clearly seen on prior.  Original Report Authenticated By: Genevive Bi, M.D.   Dg Chest Port 1 View  05/29/2011  *RADIOLOGY REPORT*  Clinical Data: Febrile.  Low oxygen saturations.  PORTABLE CHEST - 1 VIEW  Comparison: Chest radiograph 05/19/2011  Findings: Right upper extremity PICC terminates in the distal superior vena cava.  Heart size within normal limits portable technique.  Mediastinal contour stable.  Lung volumes are  low bilaterally with linear atelectasis at the right lung base.  There is peribronchial thickening.  No visible airspace disease or pleural effusion.  Osteopenia.  IMPRESSION: Low lung volumes with right basilar atelectasis and peribronchial thickening.  Original Report Authenticated By: Britta Mccreedy, M.D.    Anti-infectives: Anti-infectives     Start     Dose/Rate Route Frequency Ordered Stop   05/30/11 1130   ciprofloxacin (CIPRO) IVPB 400 mg        400 mg 200 mL/hr over 60 Minutes Intravenous 2 times daily 05/30/11 1038     05/29/11 2100   imipenem-cilastatin (PRIMAXIN) 500 mg in sodium chloride 0.9 % 100 mL IVPB        500 mg 200 mL/hr over 30 Minutes Intravenous Every 8 hours 05/29/11 2007     05/29/11 2100   vancomycin (VANCOCIN) 1,250 mg in sodium chloride 0.9 % 250 mL IVPB        1,250 mg 166.7 mL/hr over 90 Minutes Intravenous Every 24 hours 05/29/11 2007     05/07/11 1400   imipenem-cilastatin (PRIMAXIN) 500 mg in sodium chloride  0.9 % 100 mL IVPB        500 mg 200 mL/hr over 30 Minutes Intravenous 3 times per day 05/07/11 1106 05/21/11 1359   05/07/11 1200   micafungin (MYCAMINE) 100 mg in sodium chloride 0.9 % 100 mL IVPB  Status:  Discontinued     Comments: PER PHARMACY      100 mg 100 mL/hr over 1 Hours Intravenous Every 24 hours 05/07/11 1032 05/19/11 1103   05/07/11 1200   vancomycin (VANCOCIN) 1,250 mg in sodium chloride 0.9 % 250 mL IVPB  Status:  Discontinued        1,250 mg 166.7 mL/hr over 90 Minutes Intravenous Every 24 hours 05/07/11 1106 05/19/11 1103   05/07/11 1000   micafungin (MYCAMINE) 150 mg in sodium chloride 0.9 % 100 mL IVPB  Status:  Discontinued     Comments: PER PHARMACY      150 mg 100 mL/hr over 1 Hours Intravenous Daily 05/07/11 0951 05/07/11 1029   05/07/11 0300   ertapenem (INVANZ) 1 g in sodium chloride 0.9 % 50 mL IVPB        1 g 100 mL/hr over 30 Minutes Intravenous  Once 05/07/11 0300 05/07/11 0348   05/07/11 0130   cefTRIAXone  (ROCEPHIN) 1 g in dextrose 5 % 50 mL IVPB        1 g 100 mL/hr over 30 Minutes Intravenous  Once 05/07/11 0122 05/07/11 0200         Current Facility-Administered Medications  Medication Dose Route Frequency Provider Last Rate Last Dose  . acetaminophen (TYLENOL) suppository 650 mg  650 mg Rectal Q6H PRN Ardeth Sportsman, MD   650 mg at 05/27/11 1440  . alteplase (CATHFLO ACTIVASE) injection 2 mg  2 mg Intracatheter Once Iskra Magick-Myers   2 mg at 05/31/11 0000  . ciprofloxacin (CIPRO) IVPB 400 mg  400 mg Intravenous BID Iskra Magick-Myers   400 mg at 05/31/11 1024  . fentaNYL (SUBLIMAZE) injection   Intravenous PRN Casimiro Needle T. Shick   100 mcg at 05/31/11 1228  . heparin injection 5,000 Units  5,000 Units Subcutaneous Q8H Daniel J. Feinstein   5,000 Units at 05/31/11 0640  . hydrALAZINE (APRESOLINE) injection 10 mg  10 mg Intravenous Q8H PRN Vassie Loll, MD      . imipenem-cilastatin (PRIMAXIN) 500 mg in sodium chloride 0.9 % 100 mL IVPB  500 mg Intravenous Q8H [facility], PHARMD   500 mg at 05/31/11 0455  . insulin aspart (novoLOG) injection 0-15 Units  0-15 Units Subcutaneous Custom Dannielle Huh, PHARMD   2 Units at 05/30/11 1412  . insulin aspart (novoLOG) injection 0-9 Units  0-9 Units Subcutaneous Q6H Tad Moore [place], PHARMD   1 Units at 05/31/11 [phone]  . lip balm (CARMEX) ointment 1 application  1 application Topical BID Ardeth Sportsman, MD   1 application at 05/31/11 1028  . LORazepam (ATIVAN) injection 0.5 mg  0.5 mg Intravenous Q8H PRN Vassie Loll, MD   0.5 mg at 05/21/11 1239  . midazolam (VERSED) 5 MG/5ML injection   Intravenous PRN Michael T. Shick   2 mg at 05/31/11 1220  . morphine 4 MG/ML injection 3 mg  3 mg Intravenous Q3H PRN Iskra Magick-Myers   3 mg at 05/31/11 1028  . pantoprazole (PROTONIX) injection 40 mg  40 mg Intravenous Q12H Ardeth Sportsman, MD   40 mg at 05/31/11 1025  . phenol (CHLORASEPTIC) mouth spray 1 spray  1 spray Mouth/Throat  Q6H  PRN Vassie Loll, MD      . potassium chloride 10 mEq in 50 mL *CENTRAL LINE* IVPB  10 mEq Intravenous Q1 Hr x 2 Dannielle Huh, PHARMD   10 mEq at 05/31/11 1352  . sodium chloride 0.9 % injection 10 mL  10 mL Intracatheter PRN Wesam Yacoub   10 mL at 05/31/11 1414  . tpn solution (CLINIMIX E 5/15) 2,000 mL with insulin regular 50 Units infusion   Intravenous Continuous TPN Dannielle Huh, PHARMD 80 mL/hr at 05/30/11 1758    . tpn solution (CLINIMIX E 5/15) 2,000 mL with insulin regular 50 Units infusion   Intravenous Continuous TPN Dannielle Huh, PHARMD      . vancomycin (VANCOCIN) 1,250 mg in sodium chloride 0.9 % 250 mL IVPB  1,250 mg Intravenous Q24H Maryanna Shape [place], [place]   1,250 mg at 05/30/11 2143  . white petrolatum (VASELINE) gel             Assessment/Plan POD 24, EXP lap,lysis of adhesions, antrectomy, duodenal resection,B2,Jejunostomy, gastrotomy. 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  Patient Active Problem List  Diagnoses  . DIABETES MELLITUS, TYPE II  . HYPERLIPIDEMIA  . Obesity, unspecified  . DEPRESSION  . HYPERTENSION  . SINUSITIS, ACUTE  . ALLERGIC RHINITIS, SEASONAL  . OTHER CHRONIC BRONCHITIS  . DIVERTICULOSIS OF COLON  . ACUTE CYSTITIS  . INSOMNIA  . FATIGUE  . UNSTEADY GAIT  . HEADACHE  . Other urinary incontinence  . CLOSED FRACTURE OF OLECRANON PROCESS OF ULNA  . HEMATOMA  . HEADACHES, HX OF  . Anorexia  . Anemia  . Syncope and collapse  . Hepatic steatosis  . Hyponatremia  . Gastric mass  . H pylori ulcer  . Ulcer, colon  . Acute renal failure  . S/P exploratory laparotomy  . Pericardial effusion  . Fever, postprocedural  . Confusion  . Leucocytosis  . Abdominal pain, acute  . Metabolic acidosis  . Esophagitis, erosive  . Acute respiratory failure  Plan:Continue Abx, TNA, drains, Pharmacy following for TNA. Recheck labs AM.  LOS: 25 days     Oswin Griffith 05/31/2011

## 2011-05-31 NOTE — Progress Notes (Signed)
Patient ID: [name], female   DOB: 14-Jan-1932, 77 y.o.   MRN: [id number]  Subjective: No events overnight. Patient denies chest pain, shortness of breath, abdominal pain.   Objective:  Vital signs in last 24 hours:  Filed Vitals:   05/31/11 1226 05/31/11 1231 05/31/11 1235 05/31/11 1330  BP: 150/70 106/58 143/97 116/74  Pulse: 133 129 127 125  Temp:    99.5 F (37.5 C)  TempSrc:      Resp: 32 23 29 26   Height:      Weight:      SpO2: 100% 99% 99% 98%    Intake/Output from previous day:   Intake/Output Summary (Last 24 hours) at 05/31/11 2245 Last data filed at 05/31/11 0700  Gross per 24 hour  Intake    450 ml  Output    985 ml  Net   -535 ml    Physical Exam: General: Alert, awake, oriented to name only, in no acute distress. HEENT: No bruits, no goiter. Moist mucous membranes, no scleral icterus, no conjunctival pallor. Heart: Regular rhythm but tachycardic without murmurs, rubs, gallops. Lungs: Clear to auscultation bilaterally with bibasilar crackles. No wheezing, no rhonchi, no rales.  Abdomen: Soft, nontender, nondistended, positive bowel sounds. Extremities: No clubbing cyanosis or edema,  positive pedal pulses. Neuro: Grossly intact, nonfocal. Follows commands appropriately.  Lab Results:  Basic Metabolic Panel:    Component Value Date/Time   NA 125* 05/31/2011 0613   K 3.5 05/31/2011 0613   CL 95* 05/31/2011 0613   CO2 22 05/31/2011 0613   BUN 16 05/31/2011 0613   CREATININE 0.49* 05/31/2011 0613   GLUCOSE 143* 05/31/2011 0613   CALCIUM 8.0* 05/31/2011 0613   CBC:    Component Value Date/Time   WBC 8.9 05/31/2011 0613   HGB 7.3* 05/31/2011 0613   HCT 22.9* 05/31/2011 0613   PLT 393 05/31/2011 0613   MCV 81.2 05/31/2011 0613   NEUTROABS 3.8 05/25/2011 0530   LYMPHSABS 1.3 05/25/2011 0530   MONOABS 0.4 05/25/2011 0530   EOSABS 0.1 05/25/2011 0530   BASOSABS 0.1 05/25/2011 0530      Lab 05/31/11 0613 05/30/11 0500 05/29/11 0505 05/28/11 0734  05/25/11 0530  WBC 8.9 13.5* 8.0 8.1 5.7  HGB 7.3* 8.0* 7.9* 8.2* 10.0*  HCT 22.9* 25.0* 25.1* 26.2* 31.1*  PLT 393 425* 421* 441* 246  MCV 81.2 81.7 81.8 81.9 81.6  MCH 25.9* 26.1 25.7* 25.6* 26.2  MCHC 31.9 32.0 31.5 31.3 32.2  RDW 21.0* 20.9* 21.1* 21.2* 21.4*  LYMPHSABS -- -- -- -- 1.3  MONOABS -- -- -- -- 0.4  EOSABS -- -- -- -- 0.1  BASOSABS -- -- -- -- 0.1  BANDABS -- -- -- -- --    Lab 05/31/11 0613 05/30/11 0500 05/29/11 0505 05/28/11 2000 05/28/11 0734 05/27/11 0520 05/25/11 0530  NA 125* 123* 127* 126* 126* -- --  K 3.5 3.9 4.1 4.3 4.1 -- --  CL 95* 93* 93* 93* 92* -- --  CO2 22 22 25 25 27  -- --  GLUCOSE 143* 172* 146* 137* 143* -- --  BUN 16 17 16 16 16  -- --  CREATININE 0.49* 0.51 0.48* 0.52 0.48* -- --  CALCIUM 8.0* 7.8* 8.2* 8.0* 8.2* -- --  MG -- 2.0 1.6 -- -- 1.6 1.4*   No results found for this basename: INR:5,PROTIME:5 in the last 168 hours Cardiac markers:  Lab 05/31/11 1813  CKMB 1.6  TROPONINI <0.30  MYOGLOBIN --   No results  found for this basename: POCBNP:3 in the last 168 hours Recent Results (from the past 240 hour(s))  URINE CULTURE     Status: Normal (Preliminary result)   Collection Time   05/30/11  5:34 AM      Component Value Range Status Comment   Specimen Description URINE, CATHETERIZED   Final    Special Requests NONE   Final    Setup Time [id number]   Final    Colony Count >=100,000 COLONIES/ML   Final    Culture PSEUDOMONAS AERUGINOSA   Final    Report Status PENDING   Incomplete     Studies/Results: Ct Guided Abscess Drain 05/31/2011  IMPRESSION: Successful CT guided sub xyphoid intra-abdominal abscess drain placement.    Ct Abdomen Pelvis W Contrast 05/30/2011  IMPRESSION:  1.  Fluid collection with interspersed gas along the surgical site posterior to the left hepatic lobe is decreased slightly in size.  2.  Fluid collection lateral to the left hepatic lobe is not changed. 3.  Fluid collection lateral to the stomach is  slightly increased in size.  4.  Tiny focus of gas and at the porta hepatis is not clearly seen on prior.    Medications: Scheduled Meds:   . alteplase  2 mg Intracatheter Once  . ciprofloxacin  400 mg Intravenous BID  . heparin subcutaneous  5,000 Units Subcutaneous Q8H  . imipenem-cilastatin  500 mg Intravenous Q8H  . insulin aspart  0-9 Units Subcutaneous Q6H  . lip balm  1 application Topical BID  . pantoprazole (PROTONIX) IV  40 mg Intravenous Q12H  . potassium chloride  10 mEq Intravenous Q1 Hr x 2  . vancomycin  1,250 mg Intravenous Q24H   Continuous Infusions:   . TPN (CLINIMIX) +/- additives 80 mL/hr at 05/30/11 1758  . TPN (CLINIMIX) +/- additives 80 mL/hr at 05/31/11 1758   PRN Meds:.acetaminophen, fentaNYL, hydrALAZINE, LORazepam, metoprolol, midazolam, morphine injection, phenol, sodium chloride  Assessment/Plan:  Principal Problem:  *Abdominal pain, acute - slowly resolving, continue supportive care as noted above  Active Problems:  DIABETES MELLITUS, TYPE II - currently at goal and well controlled   HYPERTENSION - controlled on current medication regimen   Anemia - Hg drop noted, unclear source, will go ahead and transfuse 2 units PRBC's, CT of the abdomen unremarkable for acute events   Hyponatremia - improving   Metabolic acidosis - resolved   Esophagitis, erosive - continue Protonix   Acute respiratory failure - resolved    LOS: 25 days   MAGICK-MYERS, ISKRA 05/31/2011, 10:45 PM

## 2011-05-31 NOTE — Progress Notes (Signed)
PARENTERAL NUTRITION CONSULT NOTE - FOLLOW UP Pharmacy Consult for TPN  Indication: Bowel rest/NPO, Not tolerating TF and J-tube out. Evidence of jejunum/ileum fistula. s/p gastrectomy, gastrostomy, cholecysectomy for erosive esophagitis and perforated ulcer.    Allergies  Allergen Reactions  . Other Swelling    Pinto beans cause swelling and hives  . Penicillins Swelling    Patient Measurements: Height: 5\' 6"  (167.6 cm) Weight: 170 lb 3.1 oz (77.2 kg) IBW/kg (Calculated) : 59.3   Vital Signs: Temp: 97.6 F (36.4 C) (12/02 0630) BP: 110/52 mmHg (12/02 0630) Pulse Rate: 115  (12/02 0630) Intake/Output from previous day: 12/01 0701 - 12/02 0700 In: 650 [P.O.:450; IV Piggyback:200] Out: 2265 [Urine:2150; Drains:115] Intake/Output from this shift:    Labs:  [facility] 05/31/11 0613 05/30/11 0500 05/29/11 0505  WBC 8.9 13.5* 8.0  HGB 7.3* 8.0* 7.9*  HCT 22.9* 25.0* 25.1*  PLT 393 425* 421*  APTT -- -- --  INR -- -- --     Basename 05/31/11 0613 05/30/11 0500 05/29/11 0505  NA 125* 123* 127*  K 3.5 3.9 4.1  CL 95* 93* 93*  CO2 22 22 25   GLUCOSE 143* 172* 146*  BUN 16 17 16   CREATININE 0.49* 0.51 0.48*  LABCREA -- -- --  CREAT24HRUR -- -- --  CALCIUM 8.0* 7.8* 8.2*  MG -- 2.0 1.6  PHOS -- -- 4.0  PROT -- -- 6.3  ALBUMIN -- -- 1.8*  AST -- -- 15  ALT -- -- 9  ALKPHOS -- -- 92  BILITOT -- -- 0.2*  BILIDIR -- -- --  IBILI -- -- --  PREALBUMIN -- -- --  CHOLHDL -- -- --  CHOL -- -- --   Estimated Creatinine Clearance: 61.8 ml/min (by C-G formula based on Cr of 0.49).    Basename 05/31/11 0711 05/31/11 0412 05/31/11 0030  GLUCAP 150* 146* 141*    Medications:  Scheduled:    . alteplase  2 mg Intracatheter Once  . ciprofloxacin  400 mg Intravenous BID  . heparin subcutaneous  5,000 Units Subcutaneous Q8H  . imipenem-cilastatin  500 mg Intravenous Q8H  . insulin aspart  0-15 Units Subcutaneous Custom  . insulin aspart  0-9 Units Subcutaneous Q6H  .  lip balm  1 application Topical BID  . pantoprazole (PROTONIX) IV  40 mg Intravenous Q12H  . vancomycin  1,250 mg Intravenous Q24H  . white petrolatum       Infusions:    . TPN (CLINIMIX) +/- additives     And  . fat emulsion 234 mL (05/29/11 1737)  . TPN (CLINIMIX) +/- additives 80 mL/hr at 05/30/11 1758    Insulin Requirements in the past 24 hours:  4 units novolog SSI and 50 units regular insulin in TPN  Assessment:  77 y/o previously on TPN (for erosive esophagitis with perforated pyloric ulcer s/p lap. 11/7 with gastrostomy, gastrectomy and cholecystectomy) TPN initially started 05/12/11 - 05/14/2011. Resumed TPN 05/19/11 for increased J-tube output and and J-tube displacement. Awaiting J tube site closure. 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 TPN changed to continuous 24h infusion d/t low Na.    1. Endo - CBGs within goal.  2. Lytes - K=3.5,  low Na+/Cl- (improved from yday. IVF has been stopped. MD aware lytes could not be corrected with premade Clinimix.   3. Renal fxn stable,   Nutritional Goals:  1785-2000 kcal and 95-110 g protein   Plan:  1. Change Clinimix E5/15 at 77ml/hr.  2. Na remains low. Unable to adjust Na content in TPN.  3. Due to national backorder, lipids, MVI, and trace elements will be given MWF only.  4. F/U AM labs 5. Replace K+   Dannielle Huh 05/31/2011,10:54 AM

## 2011-05-31 NOTE — Progress Notes (Signed)
Will need f/u ct is a few days to see if we can resolve all these collections.

## 2011-05-31 NOTE — Interval H&P Note (Cosign Needed)
History and Physical Interval Note: CT revealed epigastric collection worrisome for abscess. Surgery has asked [place] to place drain percutaneously. Physical exam in CT reveals the patient to be in NAD, lungs are CTA Bilaterally anteriorly, CV: tachy without evidence of murmurs, rubs or gallops.    05/31/2011 11:31 AM  [name]  has presented today for surgery, with the diagnosis of epigastric fluid collection worrisome for abscess.  The various methods of treatment have been discussed with the patient.  After consideration of risks, benefits and other options for treatment, the patient has consented to  Procedure(s): CT guided percutaneous drainage of epigastric fluid collection with moderate sedation as a surgical intervention.  The patients' history has been reviewed, patient examined, no change in status, stable for surgery.  I have reviewed the patients' chart and labs.  Questions were answered to the patient's satisfaction.     CAMPBELL,PAMELA D, PA-C

## 2011-06-01 ENCOUNTER — Other Ambulatory Visit: Payer: Self-pay

## 2011-06-01 LAB — COMPREHENSIVE METABOLIC PANEL
Albumin: 1.5 g/dL — ABNORMAL LOW (ref 3.5–5.2)
Alkaline Phosphatase: 92 U/L (ref 39–117)
BUN: 13 mg/dL (ref 6–23)
Potassium: 3.6 mEq/L (ref 3.5–5.1)
Total Protein: 5.6 g/dL — ABNORMAL LOW (ref 6.0–8.3)

## 2011-06-01 LAB — GLUCOSE, CAPILLARY
Glucose-Capillary: 134 mg/dL — ABNORMAL HIGH (ref 70–99)
Glucose-Capillary: 140 mg/dL — ABNORMAL HIGH (ref 70–99)
Glucose-Capillary: 154 mg/dL — ABNORMAL HIGH (ref 70–99)

## 2011-06-01 LAB — DIFFERENTIAL
Basophils Absolute: 0 10*3/uL (ref 0.0–0.1)
Eosinophils Absolute: 0.4 10*3/uL (ref 0.0–0.7)
Lymphocytes Relative: 14 % (ref 12–46)
Lymphs Abs: 1 10*3/uL (ref 0.7–4.0)
Neutrophils Relative %: 73 % (ref 43–77)

## 2011-06-01 LAB — PHOSPHORUS: Phosphorus: 3.9 mg/dL (ref 2.3–4.6)

## 2011-06-01 LAB — CBC
MCH: 27.9 pg (ref 26.0–34.0)
Platelets: 371 10*3/uL (ref 150–400)
RBC: 3.55 MIL/uL — ABNORMAL LOW (ref 3.87–5.11)
RDW: 18.3 % — ABNORMAL HIGH (ref 11.5–15.5)
WBC: 7.4 10*3/uL (ref 4.0–10.5)

## 2011-06-01 LAB — CARDIAC PANEL(CRET KIN+CKTOT+MB+TROPI)
Relative Index: INVALID (ref 0.0–2.5)
Total CK: 15 U/L (ref 7–177)
Troponin I: <0.3 ng/mL (ref <0.30)

## 2011-06-01 LAB — TRIGLYCERIDES: Triglycerides: 119 mg/dL (ref <150)

## 2011-06-01 LAB — MAGNESIUM: Magnesium: 1.5 mg/dL (ref 1.5–2.5)

## 2011-06-01 MED ORDER — METOPROLOL TARTRATE 25 MG PO TABS
25.0000 mg | ORAL_TABLET | Freq: Two times a day (BID) | ORAL | Status: DC
  Administered 2011-06-01 (×2): 25 mg via ORAL
  Filled 2011-06-01 (×4): qty 1

## 2011-06-01 MED ORDER — FAT EMULSION 20 % IV EMUL
250.0000 mL | INTRAVENOUS | Status: AC
  Administered 2011-06-01: 250 mL via INTRAVENOUS
  Filled 2011-06-01: qty 250

## 2011-06-01 MED ORDER — TRACE MINERALS CR-CU-MN-SE-ZN 10-1000-500-60 MCG/ML IV SOLN
INTRAVENOUS | Status: AC
  Administered 2011-06-01: 17:00:00 via INTRAVENOUS
  Filled 2011-06-01: qty 2000

## 2011-06-01 NOTE — Progress Notes (Signed)
Physical Therapy Treatment Patient Details Name: BILLIEJO SORTO MRN: [id number] DOB: 05-29-1932 Today's Date: 06/01/2011  PT Assessment/Plan  PT - Assessment/Plan Comments on Treatment Session: Pt continues to limit herself. Able to participate but states that she cannot A with mobility when I do believe she can do more than she lets on. Pt more vocal today and more eager to get OOB. PT Plan: Discharge plan remains appropriate PT Frequency: Min 3X/week Follow Up Recommendations: Skilled nursing facility Equipment Recommended: Defer to next venue PT Goals  Acute Rehab PT Goals PT Goal: Supine/Side to Sit - Progress: Met PT Transfer Goal: Bed to Chair/Chair to Bed - Progress: Progressing toward goal  PT Treatment Precautions/Restrictions  Precautions Precautions: Fall Precaution Comments: Secondary to patient restless Restrictions Weight Bearing Restrictions: No Mobility (including Balance) Bed Mobility Supine to Sit: With rails;4: Min assist Supine to Sit Details (indicate cue type and reason): cues for safety. Required increased time. A for shoulders.  Sitting - Scoot to Edge of Bed: 4: Min assist Sitting - Scoot to [place] of Bed Details (indicate cue type and reason): A for technique. Uuse of chuck pad for effiecient scooting.  Transfers Transfers: Yes Squat Pivot Transfers: 1: +2 Total assist;Patient percentage (comment);Without upper extremity assistance;From elevated surface (p=20%) Squat Pivot Transfer Details (indicate cue type and reason): Pt required +2 total A for initiation of transfer/balance/safety. Pt able to attempt upright posture but unable to gain fully. Cues for positioning and technique. Pt very weak and attempting to resist movement.  Ambulation/Gait Ambulation/Gait: No Stairs: No Wheelchair Mobility Wheelchair Mobility: No    Exercise    End of Session PT - End of Session Equipment Utilized During Treatment: Gait belt Activity Tolerance: Patient limited by  fatigue;Patient limited by pain Patient left: in chair;with call bell in reach Nurse Communication: Mobility status for transfers General Behavior During Session: [facility] for tasks performed Cognition: [facility] for tasks performed  Robinette, Adline Potter 06/01/2011, 2:31 PM  06/01/2011 Fredrich Birks PTA [phone] pager [phone] office

## 2011-06-01 NOTE — Progress Notes (Signed)
Utilization Review Completed.[name] T12/08/2010   

## 2011-06-01 NOTE — Progress Notes (Signed)
PARENTERAL NUTRITION CONSULT NOTE - FOLLOW UP Pharmacy Consult for TPN  Indication: Bowel rest/NPO, Not tolerating TF. s/p gastrectomy, gastrostomy, cholecysectomy for erosive esophagitis and perforated ulcer. Intraabdominal abscess.   Allergies  Allergen Reactions  . Other Swelling    Pinto beans cause swelling and hives  . Penicillins Swelling    Patient Measurements: Height: 5\' 6"  (167.6 cm) Weight: 170 lb 3.1 oz (77.2 kg) IBW/kg (Calculated) : 59.3   Vital Signs: Temp: 99.2 F (37.3 C) (12/03 0930) Temp src: Oral (12/03 0840) BP: 139/76 mmHg (12/03 0930) Pulse Rate: 103  (12/03 0930) Intake/Output from previous day: 12/02 0701 - 12/03 0700 In: 1350 [Blood:700; IV Piggyback:650] Out: 1415 [Urine:600; Emesis/NG output:800; Drains:15] Intake/Output from this shift: Total I/O In: 350 [Blood:350] Out: -   Labs:  Basename 05/31/11 0613 05/30/11 0500  WBC 8.9 13.5*  HGB 7.3* 8.0*  HCT 22.9* 25.0*  PLT 393 425*  APTT -- --  INR -- --     Basename 06/01/11 0314 05/31/11 0613 05/30/11 0500  NA 127* 125* 123*  K 3.6 3.5 3.9  CL 96 95* 93*  CO2 22 22 22   GLUCOSE 140* 143* 172*  BUN 13 16 17   CREATININE 0.45* 0.49* 0.51  LABCREA -- -- --  CREAT24HRUR -- -- --  CALCIUM 7.9* 8.0* 7.8*  MG 1.5 -- 2.0  PHOS 3.9 -- --  PROT 5.6* -- --  ALBUMIN 1.5* -- --  AST 14 -- --  ALT 11 -- --  ALKPHOS 92 -- --  BILITOT 0.1* -- --  BILIDIR -- -- --  IBILI -- -- --  PREALBUMIN -- -- --  CHOLHDL -- -- --  CHOL -- -- --   Estimated Creatinine Clearance: 61.8 ml/min (by C-G formula based on Cr of 0.45).    Basename 06/01/11 0606 06/01/11 0004 05/31/11 2048  GLUCAP 149* 140* 156*    Medications:  Scheduled:     . ciprofloxacin  400 mg Intravenous BID  . heparin subcutaneous  5,000 Units Subcutaneous Q8H  . imipenem-cilastatin  500 mg Intravenous Q8H  . insulin aspart  0-9 Units Subcutaneous Q6H  . lip balm  1 application Topical BID  . metoprolol tartrate  25 mg  Oral BID  . pantoprazole (PROTONIX) IV  40 mg Intravenous Q12H  . potassium chloride  10 mEq Intravenous Q1 Hr x 2  . vancomycin  1,250 mg Intravenous Q24H   Infusions:     . TPN (CLINIMIX) +/- additives 80 mL/hr at 05/30/11 1758  . TPN (CLINIMIX) +/- additives 80 mL/hr at 05/31/11 1758    Insulin Requirements in the past 24 hours:  4 units novolog SSI and 50 units regular insulin in TPN  Assessment:  77 y/o previously on TPN (for erosive esophagitis with perforated pyloric ulcer s/p lap. 11/7 with gastrostomy, gastrectomy and cholecystectomy) TPN initially started 05/12/11 - 05/14/2011. 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 TPN changed to continuous 24h infusion d/t low Na.    1. Endo - CBGs within goal.  2. Lytes - K=3.6  Na improved @127   3. Renal fxn stable,   Nutritional Goals:  1785-2000 kcal and 95-110 g protein   Plan:  1. No change today.  2. Due to national backorder, lipids, MVI, and trace elements will be given MWF only.  3. F/U AM labs    Talbert Cage Poteet 06/01/2011,10:02 AM

## 2011-06-01 NOTE — Progress Notes (Signed)
25 Days Post-Op  Subjective: Epigastric abscess drain placed 12/2. Pt feels some better.   Objective: Vital signs in last 24 hours: Temp:  [98.3 F (36.8 C)-99.5 F (37.5 C)] 99.2 F (37.3 C) (12/03 0930) Pulse Rate:  [92-133] 103  (12/03 0930) Resp:  [16-32] 20  (12/03 0930) BP: (106-158)/(53-97) 139/76 mmHg (12/03 0930) SpO2:  [98 %-100 %] 99 % (12/02 2330) Last BM Date: 05/30/11  Intake/Output from previous day: 12/02 0701 - 12/03 0700 In: 1350 [Blood:700; IV Piggyback:650] Out: 1415 [Urine:600; Emesis/NG output:800; Drains:15] Intake/Output this shift: Total I/O In: 350 [Blood:350] Out: -   PE: 99.2; site clean and dry; NT Output milky brown in color approx 25 cc in JP now 105 cc 12/2 total Cx neg so far WBC 8.9 from 13.5  Lab Results:   Basename 05/31/11 0613 05/30/11 0500  WBC 8.9 13.5*  HGB 7.3* 8.0*  HCT 22.9* 25.0*  PLT 393 425*   BMET  Basename 06/01/11 0314 05/31/11 0613  NA 127* 125*  K 3.6 3.5  CL 96 95*  CO2 22 22  GLUCOSE 140* 143*  BUN 13 16  CREATININE 0.45* 0.49*  CALCIUM 7.9* 8.0*   PT/INR No results found for this basename: LABPROT:2,INR:2 in the last 72 hours ABG No results found for this basename: PHART:2,PCO2:2,PO2:2,HCO3:2 in the last 72 hours  Studies/Results: Ct Guided Abscess Drain  05/31/2011  *RADIOLOGY REPORT*  Clinical Data: Intra-abdominal sub xyphoid abscess postoperatively  CT GUIDED INTRA-ABDOMINAL ABSCESS DRAINAGE  Date:  05/31/2011 11:23:00  Radiologist:  Judie Petit. Ruel Favors, M.D.  Medications:  2 mg Versed, 100 mcg Fentanyl  Guidance:  CT  Fluoroscopy time:  None.  Sedation time:  18 minutes  Contrast volume:  None.  Complications:  No immediate  PROCEDURE/FINDINGS:  Informed consent was obtained from the patient following explanation of the procedure, risks, benefits and alternatives. The patient understands, agrees and consents for the procedure. All questions were addressed.  A time out was performed.  Maximal barrier  sterile technique utilized including caps, mask, sterile gowns, sterile gloves, large sterile drape, hand hygiene, and betadine  Previous imaging was reviewed.  The midline sub xyphoid intra- abdominal abscess was localized with the patient supine utilizing noncontrast CT.  Under sterile conditions and local anesthesia, an 18 gauge 10 cm access needle was advanced from an anterior oblique approach into the fluid collection.  Syringe aspiration yielded purulent fluid consistent with an abscess.  Guide wire advanced. Guide wire position confirmed with CT.  Tract dilatation performed to advance a 12-French drain with the retention loop formed in the cavity.  Syringe aspiration yielded 20 ml of thick purulent fluid consistent with an abscess.  Sample sent for Gram stain and culture.  Catheter secured with a Prolene suture and connected to external suction bulb.  Sterile dressing applied.  IMPRESSION: Successful CT guided sub xyphoid intra-abdominal abscess drain placement.  Original Report Authenticated By: Judie Petit. Ruel Favors, M.D.    Anti-infectives: Anti-infectives     Start     Dose/Rate Route Frequency Ordered Stop   05/30/11 1130   ciprofloxacin (CIPRO) IVPB 400 mg        400 mg 200 mL/hr over 60 Minutes Intravenous 2 times daily 05/30/11 1038     05/29/11 2100   imipenem-cilastatin (PRIMAXIN) 500 mg in sodium chloride 0.9 % 100 mL IVPB        500 mg 200 mL/hr over 30 Minutes Intravenous Every 8 hours 05/29/11 2007     05/29/11 2100  vancomycin (VANCOCIN) 1,250 mg in sodium chloride 0.9 % 250 mL IVPB        1,250 mg 166.7 mL/hr over 90 Minutes Intravenous Every 24 hours 05/29/11 2007     05/07/11 1400   imipenem-cilastatin (PRIMAXIN) 500 mg in sodium chloride 0.9 % 100 mL IVPB        500 mg 200 mL/hr over 30 Minutes Intravenous 3 times per day 05/07/11 1106 05/21/11 1359   05/07/11 1200   micafungin (MYCAMINE) 100 mg in sodium chloride 0.9 % 100 mL IVPB  Status:  Discontinued     Comments: PER  PHARMACY      100 mg 100 mL/hr over 1 Hours Intravenous Every 24 hours 05/07/11 1032 05/19/11 1103   05/07/11 1200   vancomycin (VANCOCIN) 1,250 mg in sodium chloride 0.9 % 250 mL IVPB  Status:  Discontinued        1,250 mg 166.7 mL/hr over 90 Minutes Intravenous Every 24 hours 05/07/11 1106 05/19/11 1103   05/07/11 1000   micafungin (MYCAMINE) 150 mg in sodium chloride 0.9 % 100 mL IVPB  Status:  Discontinued     Comments: PER PHARMACY      150 mg 100 mL/hr over 1 Hours Intravenous Daily 05/07/11 0951 05/07/11 1029   05/07/11 0300   ertapenem (INVANZ) 1 g in sodium chloride 0.9 % 50 mL IVPB        1 g 100 mL/hr over 30 Minutes Intravenous  Once 05/07/11 0300 05/07/11 0348   05/07/11 0130   cefTRIAXone (ROCEPHIN) 1 g in dextrose 5 % 50 mL IVPB        1 g 100 mL/hr over 30 Minutes Intravenous  Once 05/07/11 0122 05/07/11 0200          Assessment/Plan: s/p Procedure(s): EXPLORATORY LAPAROTOMY GASTROSTOMY GASTRECTOMY CHOLECYSTECTOMY  Epigastric abscess drain placed 12/2. Pt some better today. Wbc down Will follow Plan per surgery    Naomia Lenderman A 06/01/2011

## 2011-06-01 NOTE — Progress Notes (Signed)
CSW spoke with CM who stated patient was denied LTAC coverage by insurance but insurance agreed to 7 days of SNF. CSW called Dr. Jacky Kindle and left a message explaining that patient has coverage and to see if any updates had been made regarding patient going to [facility]. CSW left contact information for [employer] (Ketly-[phone] ext. [phone]) CSW will continue to follow. Unk Lightning [phone]

## 2011-06-01 NOTE — Progress Notes (Signed)
ANTIBIOTIC CONSULT NOTE - FOLLOW UP  Pharmacy Consult for Vancomycin/Primaxin Indication: abdominal infection  Allergies  Allergen Reactions  . Other Swelling    Pinto beans cause swelling and hives  . Penicillins Swelling    Patient Measurements: Height: 5\' 6"  (167.6 cm) Weight: 170 lb 3.1 oz (77.2 kg) IBW/kg (Calculated) : 59.3   Vital Signs: Temp: 99.2 F (37.3 C) (12/03 0930) Temp src: Oral (12/03 0840) BP: 136/78 mmHg (12/03 1122) Pulse Rate: 102  (12/03 1122)  Labs:  Basename 06/01/11 1110 06/01/11 0314 05/31/11 0613 05/30/11 0500  WBC 7.4 -- 8.9 13.5*  HGB 9.9* -- 7.3* 8.0*  PLT 371 -- 393 425*  LABCREA -- -- -- --  CREATININE -- 0.45* 0.49* 0.51   Estimated Creatinine Clearance: 61.8 ml/min (by C-G formula based on Cr of 0.45). No results found for this basename: VANCOTROUGH:2,VANCOPEAK:2,VANCORANDOM:2,GENTTROUGH:2,GENTPEAK:2,GENTRANDOM:2,TOBRATROUGH:2,TOBRAPEAK:2,TOBRARND:2,AMIKACINPEAK:2,AMIKACINTROU:2,AMIKACIN:2, in the last 72 hours   Microbiology: Recent Results (from the past 720 hour(s))  SURGICAL PCR SCREEN     Status: Abnormal   Collection Time   05/07/11  9:30 AM      Component Value Range Status Comment   MRSA, PCR NEGATIVE  NEGATIVE  Final    Staphylococcus aureus POSITIVE (*) NEGATIVE  Final   URINE CULTURE     Status: Normal   Collection Time   05/07/11 10:20 AM      Component Value Range Status Comment   Specimen Description URINE, RANDOM   Final    Special Requests NONE   Final    Setup Time [id number]   Final    Colony Count NO GROWTH   Final    Culture NO GROWTH   Final    Report Status 05/08/2011 FINAL   Final   CULTURE, BLOOD (ROUTINE X 2)     Status: Normal   Collection Time   05/07/11 11:20 AM      Component Value Range Status Comment   Specimen Description BLOOD   Final    Special Requests     Final    Value: BOTTLES DRAWN AEROBIC AND ANAEROBIC 5CC RT SUBCLAVIAN VEIN   Setup Time [id number]   Final    Culture NO GROWTH 5  DAYS   Final    Report Status 05/13/2011 FINAL   Final   CULTURE, BLOOD (ROUTINE X 2)     Status: Normal   Collection Time   05/07/11 11:20 AM      Component Value Range Status Comment   Specimen Description BLOOD   Final    Special Requests     Final    Value: BOTTLES DRAWN AEROBIC AND ANAEROBIC 5CC RT SUBCLAVIAN VEIN   Setup Time [id number]   Final    Culture NO GROWTH 5 DAYS   Final    Report Status 05/13/2011 FINAL   Final   CULTURE, RESPIRATORY     Status: Normal   Collection Time   05/13/11  6:02 AM      Component Value Range Status Comment   Specimen Description ENDOTRACHEAL   Final    Special Requests NONE   Final    Gram Stain     Final    Value: FEW WBC PRESENT, PREDOMINANTLY PMN     RARE SQUAMOUS EPITHELIAL CELLS PRESENT     NO ORGANISMS SEEN   Culture NO GROWTH 2 DAYS   Final    Report Status 05/15/2011 FINAL   Final   URINE CULTURE     Status: Normal (Preliminary result)   Collection Time  05/30/11  5:34 AM      Component Value Range Status Comment   Specimen Description URINE, CATHETERIZED   Final    Special Requests NONE   Final    Setup Time [id number]   Final    Colony Count >=100,000 COLONIES/ML   Final    Culture     Final    Value: PSEUDOMONAS AERUGINOSA     ESCHERICHIA COLI   Report Status PENDING   Incomplete    Organism ID, Bacteria PSEUDOMONAS AERUGINOSA   Final   CULTURE, ROUTINE-ABSCESS     Status: Normal (Preliminary result)   Collection Time   05/31/11  1:29 PM      Component Value Range Status Comment   Specimen Description ABSCESS ABDOMEN RUQ   Final    Special Requests PATIENT ON FOLLOWING VANC, CIRPO, PRIMAXIN   Final    Gram Stain PENDING   Incomplete    Culture NO GROWTH 1 DAY   Final    Report Status PENDING   Incomplete     Anti-infectives     Start     Dose/Rate Route Frequency Ordered Stop   05/30/11 1130   ciprofloxacin (CIPRO) IVPB 400 mg        400 mg 200 mL/hr over 60 Minutes Intravenous 2 times daily 05/30/11 1038      05/29/11 2100   imipenem-cilastatin (PRIMAXIN) 500 mg in sodium chloride 0.9 % 100 mL IVPB        500 mg 200 mL/hr over 30 Minutes Intravenous Every 8 hours 05/29/11 2007     05/29/11 2100   vancomycin (VANCOCIN) 1,250 mg in sodium chloride 0.9 % 250 mL IVPB        1,250 mg 166.7 mL/hr over 90 Minutes Intravenous Every 24 hours 05/29/11 2007     05/07/11 1400   imipenem-cilastatin (PRIMAXIN) 500 mg in sodium chloride 0.9 % 100 mL IVPB        500 mg 200 mL/hr over 30 Minutes Intravenous 3 times per day 05/07/11 1106 05/21/11 1359   05/07/11 1200   micafungin (MYCAMINE) 100 mg in sodium chloride 0.9 % 100 mL IVPB  Status:  Discontinued     Comments: PER PHARMACY      100 mg 100 mL/hr over 1 Hours Intravenous Every 24 hours 05/07/11 1032 05/19/11 1103   05/07/11 1200   vancomycin (VANCOCIN) 1,250 mg in sodium chloride 0.9 % 250 mL IVPB  Status:  Discontinued        1,250 mg 166.7 mL/hr over 90 Minutes Intravenous Every 24 hours 05/07/11 1106 05/19/11 1103   05/07/11 1000   micafungin (MYCAMINE) 150 mg in sodium chloride 0.9 % 100 mL IVPB  Status:  Discontinued     Comments: PER PHARMACY      150 mg 100 mL/hr over 1 Hours Intravenous Daily 05/07/11 0951 05/07/11 1029   05/07/11 0300   ertapenem (INVANZ) 1 g in sodium chloride 0.9 % 50 mL IVPB        1 g 100 mL/hr over 30 Minutes Intravenous  Once 05/07/11 0300 05/07/11 0348   05/07/11 0130   cefTRIAXone (ROCEPHIN) 1 g in dextrose 5 % 50 mL IVPB        1 g 100 mL/hr over 30 Minutes Intravenous  Once 05/07/11 0122 05/07/11 0200          Assessment: [name] s/p gastrectomy, gastrostomy, cholecysectomy for erosive esophagitis and perforated ulcer. Intraabdominal abscess, s/p epigastric train placement on vancomycin and primaxin for  abdominal infection. Pt. Has been on vancomycin 11/8 -11/20, vanc trough was 18.4 while on 1250 q 24 hours regimen. Her renal function has been stable. Pt is afebrile, wbc wnl.   Cipro was added on 12/1  for UTI, urine culture positive for psudomonas (pan-sensitive) and E. Coli (sensitive) pending. Primaxin should cover both organism.    Goal of Therapy:  Vancomycin trough level 15-20 mcg/ml  Plan:  1) continue vancomycin 1250mg  IV q 24 2) continue 500mg  IV q8hrs 3) will f/u renal fx, may check vanc level after pt. Receive 5 doses. 4) recommend to d/c Cipro since Primaxin should cover both psudomonas and E. coli, unless E. Coli sensitivity shows otherwise, which I think is very unlikely.  Riki Rusk 06/01/2011,1:53 PM

## 2011-06-01 NOTE — Progress Notes (Signed)
Patient ID: [name], female   DOB: December 05, 1931, 77 y.o.   MRN: [id number] Patient ID: [name], female   DOB: Dec 14, 1931, 77 y.o.   MRN: [id number] 25 Days Post-Op  Subjective: C/o of stomach hurting. Drainage about the same.  Drainage from #2 appears to be down since new drain placed.  She is being transfused now. Fever curve looks better.     Objective: Vital signs in last 24 hours: Temp:  [98.3 F (36.8 C)-99.5 F (37.5 C)] 99.4 F (37.4 C) (12/03 0740) Pulse Rate:  [92-133] 95  (12/03 0740) Resp:  [16-32] 20  (12/03 0740) BP: (106-158)/(53-97) 120/84 mmHg (12/03 0740) SpO2:  [98 %-100 %] 99 % (12/02 2330) Last BM Date: 05/30/11  Intake/Output from previous day: 12/02 0701 - 12/03 0700 In: 1000 [Blood:350; IV Piggyback:650] Out: 1415 [Urine:600; Emesis/NG output:800; Drains:15] Intake/Output this shift:   Exam:Lying on side c/o of abd pain.  Drains continue as before.  Few bowel sounds, she isn't distended.  Chest: clear, but decreased BS in bases.  LE: No edema.  Lab Results:   Basename 05/31/11 0613 05/30/11 0500  WBC 8.9 13.5*  HGB 7.3* 8.0*  HCT 22.9* 25.0*  PLT 393 425*    BMET  Basename 06/01/11 0314 05/31/11 0613  NA 127* 125*  K 3.6 3.5  CL 96 95*  CO2 22 22  GLUCOSE 140* 143*  BUN 13 16  CREATININE 0.45* 0.49*  CALCIUM 7.9* 8.0*   PT/INR No results found for this basename: LABPROT:2,INR:2 in the last 72 hours   Studies/Results: Ct Guided Abscess Drain  05/31/2011  *RADIOLOGY REPORT*  Clinical Data: Intra-abdominal sub xyphoid abscess postoperatively  CT GUIDED INTRA-ABDOMINAL ABSCESS DRAINAGE  Date:  05/31/2011 11:23:00  Radiologist:  Judie Petit. Ruel Favors, M.D.  Medications:  2 mg Versed, 100 mcg Fentanyl  Guidance:  CT  Fluoroscopy time:  None.  Sedation time:  18 minutes  Contrast volume:  None.  Complications:  No immediate  PROCEDURE/FINDINGS:  Informed consent was obtained from the patient following explanation of the procedure, risks, benefits  and alternatives. The patient understands, agrees and consents for the procedure. All questions were addressed.  A time out was performed.  Maximal barrier sterile technique utilized including caps, mask, sterile gowns, sterile gloves, large sterile drape, hand hygiene, and betadine  Previous imaging was reviewed.  The midline sub xyphoid intra- abdominal abscess was localized with the patient supine utilizing noncontrast CT.  Under sterile conditions and local anesthesia, an 18 gauge 10 cm access needle was advanced from an anterior oblique approach into the fluid collection.  Syringe aspiration yielded purulent fluid consistent with an abscess.  Guide wire advanced. Guide wire position confirmed with CT.  Tract dilatation performed to advance a 12-French drain with the retention loop formed in the cavity.  Syringe aspiration yielded 20 ml of thick purulent fluid consistent with an abscess.  Sample sent for Gram stain and culture.  Catheter secured with a Prolene suture and connected to external suction bulb.  Sterile dressing applied.  IMPRESSION: Successful CT guided sub xyphoid intra-abdominal abscess drain placement.  Original Report Authenticated By: Judie Petit. Ruel Favors, M.D.    Anti-infectives: Anti-infectives     Start     Dose/Rate Route Frequency Ordered Stop   05/30/11 1130   ciprofloxacin (CIPRO) IVPB 400 mg        400 mg 200 mL/hr over 60 Minutes Intravenous 2 times daily 05/30/11 1038     05/29/11 2100   imipenem-cilastatin (  PRIMAXIN) 500 mg in sodium chloride 0.9 % 100 mL IVPB        500 mg 200 mL/hr over 30 Minutes Intravenous Every 8 hours 05/29/11 2007     05/29/11 2100   vancomycin (VANCOCIN) 1,250 mg in sodium chloride 0.9 % 250 mL IVPB        1,250 mg 166.7 mL/hr over 90 Minutes Intravenous Every 24 hours 05/29/11 2007     05/07/11 1400   imipenem-cilastatin (PRIMAXIN) 500 mg in sodium chloride 0.9 % 100 mL IVPB        500 mg 200 mL/hr over 30 Minutes Intravenous 3 times per day  05/07/11 1106 05/21/11 1359   05/07/11 1200   micafungin (MYCAMINE) 100 mg in sodium chloride 0.9 % 100 mL IVPB  Status:  Discontinued     Comments: PER PHARMACY      100 mg 100 mL/hr over 1 Hours Intravenous Every 24 hours 05/07/11 1032 05/19/11 1103   05/07/11 1200   vancomycin (VANCOCIN) 1,250 mg in sodium chloride 0.9 % 250 mL IVPB  Status:  Discontinued        1,250 mg 166.7 mL/hr over 90 Minutes Intravenous Every 24 hours 05/07/11 1106 05/19/11 1103   05/07/11 1000   micafungin (MYCAMINE) 150 mg in sodium chloride 0.9 % 100 mL IVPB  Status:  Discontinued     Comments: PER PHARMACY      150 mg 100 mL/hr over 1 Hours Intravenous Daily 05/07/11 0951 05/07/11 1029   05/07/11 0300   ertapenem (INVANZ) 1 g in sodium chloride 0.9 % 50 mL IVPB        1 g 100 mL/hr over 30 Minutes Intravenous  Once 05/07/11 0300 05/07/11 0348   05/07/11 0130   cefTRIAXone (ROCEPHIN) 1 g in dextrose 5 % 50 mL IVPB        1 g 100 mL/hr over 30 Minutes Intravenous  Once 05/07/11 0122 05/07/11 0200         Current Facility-Administered Medications  Medication Dose Route Frequency Provider Last Rate Last Dose  . acetaminophen (TYLENOL) suppository 650 mg  650 mg Rectal Q6H PRN Ardeth Sportsman, MD   650 mg at 06/01/11 0027  . ciprofloxacin (CIPRO) IVPB 400 mg  400 mg Intravenous BID Iskra Magick-Myers   400 mg at 06/01/11 0029  . fentaNYL (SUBLIMAZE) injection   Intravenous PRN Casimiro Needle T. Shick   100 mcg at 05/31/11 1228  . heparin injection 5,000 Units  5,000 Units Subcutaneous Q8H Daniel J. Feinstein   5,000 Units at 06/01/11 1610  . hydrALAZINE (APRESOLINE) injection 10 mg  10 mg Intravenous Q8H PRN Vassie Loll, MD      . imipenem-cilastatin (PRIMAXIN) 500 mg in sodium chloride 0.9 % 100 mL IVPB  500 mg Intravenous [address] [place], PHARMD   500 mg at 06/01/11 9604  . insulin aspart (novoLOG) injection 0-9 Units  0-9 Units Subcutaneous Q6H Tad Moore [place], PHARMD   1 Units at 06/01/11  0636  . lip balm (CARMEX) ointment 1 application  1 application Topical BID Ardeth Sportsman, MD   1 application at 05/31/11 2314  . LORazepam (ATIVAN) injection 0.5 mg  0.5 mg Intravenous Q8H PRN Vassie Loll, MD   0.5 mg at 05/21/11 1239  . metoprolol (LOPRESSOR) injection 5 mg  5 mg Intravenous TID PRN Iskra Magick-Myers      . midazolam (VERSED) 5 MG/5ML injection   Intravenous PRN Michael T. Shick   2 mg at 05/31/11 1220  .  morphine 4 MG/ML injection 3 mg  3 mg Intravenous Q3H PRN Iskra Magick-Myers   3 mg at 06/01/11 0823  . pantoprazole (PROTONIX) injection 40 mg  40 mg Intravenous Q12H Ardeth Sportsman, MD   40 mg at 05/31/11 2313  . phenol (CHLORASEPTIC) mouth spray 1 spray  1 spray Mouth/Throat Q6H PRN Vassie Loll, MD      . potassium chloride 10 mEq in 50 mL *CENTRAL LINE* IVPB  10 mEq Intravenous Q1 Hr x 2 Dannielle Huh, PHARMD   10 mEq at 05/31/11 1455  . sodium chloride 0.9 % injection 10 mL  10 mL Intracatheter PRN Wesam Yacoub   10 mL at 06/01/11 0307  . tpn solution (CLINIMIX E 5/15) 2,000 mL with insulin regular 50 Units infusion   Intravenous Continuous TPN Dannielle Huh, PHARMD 80 mL/hr at 05/30/11 1758    . tpn solution (CLINIMIX E 5/15) 2,000 mL with insulin regular 50 Units infusion   Intravenous Continuous TPN Dannielle Huh, PHARMD 80 mL/hr at 05/31/11 1758    . vancomycin (VANCOCIN) 1,250 mg in sodium chloride 0.9 % 250 mL IVPB  1,250 mg Intravenous Q24H Maryanna Shape [place], PHARMD   1,250 mg at 05/31/11 2317    Assessment/Plan POD 25, EXP lap,lysis of adhesions, antrectomy, duodenal resection,B2,Jejunostomy, gastrotomy. 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  Patient Active Problem List  Diagnoses  . DIABETES MELLITUS, TYPE II  . HYPERLIPIDEMIA  . Obesity, unspecified  . DEPRESSION  . HYPERTENSION  . SINUSITIS, ACUTE  . ALLERGIC RHINITIS, SEASONAL  . OTHER CHRONIC BRONCHITIS  . DIVERTICULOSIS OF COLON  .  ACUTE CYSTITIS  . INSOMNIA  . FATIGUE  . UNSTEADY GAIT  . HEADACHE  . Other urinary incontinence  . CLOSED FRACTURE OF OLECRANON PROCESS OF ULNA  . HEMATOMA  . HEADACHES, HX OF  . Anorexia  . Anemia  . Syncope and collapse  . Hepatic steatosis  . Hyponatremia  . Gastric mass  . H pylori ulcer  . Ulcer, colon  . Acute renal failure  . S/P exploratory laparotomy  . Pericardial effusion  . Fever, postprocedural  . Confusion  . Leucocytosis  . Abdominal pain, acute  . Metabolic acidosis  . Esophagitis, erosive  . Acute respiratory failure  Plan:Continue Abx, TNA, drains, Pharmacy following for TNA. Appreciate Medicines help and transfusion.   LOS: 26 days    Brigette Hopfer 06/01/2011

## 2011-06-01 NOTE — Progress Notes (Signed)
Patient ID: [name], female   DOB: 08-31-1931, 77 y.o.   MRN: [id number]  Subjective: No events overnight. Patient denies chest pain, shortness of breath, abdominal pain.  Objective:  Vital signs in last 24 hours:  Filed Vitals:   06/01/11 0640 06/01/11 0740 06/01/11 0840 06/01/11 0930  BP: 138/83 120/84 150/84 139/76  Pulse: 95 95 98 103  Temp: 99 F (37.2 C) 99.4 F (37.4 C) 98.9 F (37.2 C) 99.2 F (37.3 C)  TempSrc: Oral Oral Oral   Resp: 20 20 20 20   Height:      Weight:      SpO2:        Intake/Output from previous day:   Intake/Output Summary (Last 24 hours) at 06/01/11 0953 Last data filed at 06/01/11 0930  Gross per 24 hour  Intake   1700 ml  Output   1415 ml  Net    285 ml    Physical Exam: General: Alert, awake, oriented x3, in no acute distress. HEENT: No bruits, no goiter. Moist mucous membranes, no scleral icterus, no conjunctival pallor. Heart: Regular rate and rhythm, without murmurs, rubs, gallops. Lungs: Clear to auscultation bilaterally. No wheezing, no rhonchi, no rales.  Abdomen: Soft, nontender, nondistended, positive bowel sounds. Extremities: No clubbing cyanosis or edema,  positive pedal pulses. Neuro: Grossly intact, nonfocal.  Lab Results:  Basic Metabolic Panel:    Component Value Date/Time   NA 127* 06/01/2011 0314   K 3.6 06/01/2011 0314   CL 96 06/01/2011 0314   CO2 22 06/01/2011 0314   BUN 13 06/01/2011 0314   CREATININE 0.45* 06/01/2011 0314   GLUCOSE 140* 06/01/2011 0314   CALCIUM 7.9* 06/01/2011 0314   CBC:    Component Value Date/Time   WBC 8.9 05/31/2011 0613   HGB 7.3* 05/31/2011 0613   HCT 22.9* 05/31/2011 0613   PLT 393 05/31/2011 0613   MCV 81.2 05/31/2011 0613   NEUTROABS 3.8 05/25/2011 0530   LYMPHSABS 1.3 05/25/2011 0530   MONOABS 0.4 05/25/2011 0530   EOSABS 0.1 05/25/2011 0530   BASOSABS 0.1 05/25/2011 0530      Lab 05/31/11 0613 05/30/11 0500 05/29/11 0505 05/28/11 0734  WBC 8.9 13.5* 8.0 8.1  HGB 7.3*  8.0* 7.9* 8.2*  HCT 22.9* 25.0* 25.1* 26.2*  PLT 393 425* 421* 441*  MCV 81.2 81.7 81.8 81.9  MCH 25.9* 26.1 25.7* 25.6*  MCHC 31.9 32.0 31.5 31.3  RDW 21.0* 20.9* 21.1* 21.2*  LYMPHSABS -- -- -- --  MONOABS -- -- -- --  EOSABS -- -- -- --  BASOSABS -- -- -- --  BANDABS -- -- -- --    Lab 06/01/11 0314 05/31/11 0613 05/30/11 0500 05/29/11 0505 05/28/11 2000 05/27/11 0520  NA 127* 125* 123* 127* 126* --  K 3.6 3.5 3.9 4.1 4.3 --  CL 96 95* 93* 93* 93* --  CO2 22 22 22 25 25  --  GLUCOSE 140* 143* 172* 146* 137* --  BUN 13 16 17 16 16  --  CREATININE 0.45* 0.49* 0.51 0.48* 0.52 --  CALCIUM 7.9* 8.0* 7.8* 8.2* 8.0* --  MG 1.5 -- 2.0 1.6 -- 1.6   No results found for this basename: INR:5,PROTIME:5 in the last 168 hours Cardiac markers:  Lab 06/01/11 0313 05/31/11 1813  CKMB 1.5 1.6  TROPONINI <0.30 <0.30  MYOGLOBIN -- --   No results found for this basename: POCBNP:3 in the last 168 hours Recent Results (from the past 240 hour(s))  URINE CULTURE  Status: Normal (Preliminary result)   Collection Time   05/30/11  5:34 AM      Component Value Range Status Comment   Specimen Description URINE, CATHETERIZED   Final    Special Requests NONE   Final    Setup Time [id number]   Final    Colony Count >=100,000 COLONIES/ML   Final    Culture     Final    Value: PSEUDOMONAS AERUGINOSA     ESCHERICHIA COLI   Report Status PENDING   Incomplete    Organism ID, Bacteria PSEUDOMONAS AERUGINOSA   Final   CULTURE, ROUTINE-ABSCESS     Status: Normal (Preliminary result)   Collection Time   05/31/11  1:29 PM      Component Value Range Status Comment   Specimen Description ABSCESS ABDOMEN RUQ   Final    Special Requests PATIENT ON FOLLOWING VANC, CIRPO, PRIMAXIN   Final    Gram Stain PENDING   Incomplete    Culture NO GROWTH 1 DAY   Final    Report Status PENDING   Incomplete     Studies/Results: Ct Guided Abscess Drain  05/31/2011    IMPRESSION: Successful CT guided sub xyphoid  intra-abdominal abscess drain placement.     Medications: Scheduled Meds:   . ciprofloxacin  400 mg Intravenous BID  . heparin subcutaneous  5,000 Units Subcutaneous Q8H  . imipenem-cilastatin  500 mg Intravenous Q8H  . insulin aspart  0-9 Units Subcutaneous Q6H  . lip balm  1 application Topical BID  . pantoprazole (PROTONIX) IV  40 mg Intravenous Q12H  . potassium chloride  10 mEq Intravenous Q1 Hr x 2  . vancomycin  1,250 mg Intravenous Q24H   Continuous Infusions:   . TPN (CLINIMIX) +/- additives 80 mL/hr at 05/30/11 1758  . TPN (CLINIMIX) +/- additives 80 mL/hr at 05/31/11 1758   PRN Meds:.acetaminophen, fentaNYL, hydrALAZINE, LORazepam, metoprolol, midazolam, morphine injection, phenol, sodium chloride  Assessment/Plan:  Principal Problem:  *Abdominal pain, acute - slowly resolving, continue supportive care as noted above   Active Problems:  DIABETES MELLITUS, TYPE II - currently at goal and well controlled   HYPERTENSION - controlled on current medication regimen   Anemia - Hg drop noted, unclear source, s/p 2 units PRBC's, CT of the abdomen unremarkable for acute events, follow up on CBC  Hyponatremia - improving   Metabolic acidosis - resolved   Esophagitis, erosive - continue Protonix   Acute respiratory failure - resolved    LOS: 26 days   MAGICK-Lillian Tigges 06/01/2011, 9:53 AM

## 2011-06-02 LAB — CBC
HCT: 30.3 % — ABNORMAL LOW (ref 36.0–46.0)
RDW: 18.6 % — ABNORMAL HIGH (ref 11.5–15.5)
WBC: 8.1 10*3/uL (ref 4.0–10.5)

## 2011-06-02 LAB — TYPE AND SCREEN
ABO/RH(D): A POS
Unit division: 0

## 2011-06-02 LAB — BASIC METABOLIC PANEL
BUN: 11 mg/dL (ref 6–23)
Calcium: 8.2 mg/dL — ABNORMAL LOW (ref 8.4–10.5)
Chloride: 98 mEq/L (ref 96–112)
Creatinine, Ser: 0.46 mg/dL — ABNORMAL LOW (ref 0.50–1.10)
GFR calc Af Amer: >90 mL/min (ref >90)
GFR calc non Af Amer: >90 mL/min (ref >90)

## 2011-06-02 LAB — URINE CULTURE: Colony Count: >=100000

## 2011-06-02 LAB — GLUCOSE, CAPILLARY: Glucose-Capillary: 138 mg/dL — ABNORMAL HIGH (ref 70–99)

## 2011-06-02 MED ORDER — INSULIN REGULAR HUMAN 100 UNIT/ML IJ SOLN
INTRAVENOUS | Status: AC
  Administered 2011-06-02: 18:00:00 via INTRAVENOUS
  Filled 2011-06-02: qty 2000

## 2011-06-02 MED ORDER — METOPROLOL TARTRATE 1 MG/ML IV SOLN
5.0000 mg | Freq: Two times a day (BID) | INTRAVENOUS | Status: DC
  Administered 2011-06-02 – 2011-06-21 (×39): 5 mg via INTRAVENOUS
  Filled 2011-06-02 (×44): qty 5

## 2011-06-02 NOTE — Progress Notes (Signed)
Patient ID: [name], female   DOB: 18-Oct-1931, 77 y.o.   MRN: [id number] 26 Days Post-Op  Subjective: She goes from saying she feels better, to "O God my stomach." Drainage down from last several days  Objective: Vital signs in last 24 hours: Temp:  [98.3 F (36.8 C)-99.8 F (37.7 C)] 99.8 F (37.7 C) (12/04 0509) Pulse Rate:  [87-103] 87  (12/04 0509) Resp:  [18-20] 18  (12/04 0509) BP: (114-150)/(67-90) 142/78 mmHg (12/04 0509) SpO2:  [95 %-98 %] 97 % (12/04 0509) Last BM Date: 06/01/11  Intake/Output from previous day: 12/03 0701 - 12/04 0700 In: 350 [Blood:350] Out: 2210 [Urine:2050; Drains:160] Intake/Output this shift:   Exam:Lying on side c/o of abd pain.  Drains continue as before.  Few bowel sounds, she isn't distended.  Chest: rales, in bases, more R than left  LE: No edema.  Lab Results:   Basename 06/02/11 0610 06/01/11 1110  WBC 8.1 7.4  HGB 10.0* 9.9*  HCT 30.3* 29.2*  PLT 392 371  H/H 7.3/22.9 11/30  BMET  Basename 06/02/11 0610 06/01/11 0314  NA 131* 127*  K 3.6 3.6  CL 98 96  CO2 24 22  GLUCOSE 105* 140*  BUN 11 13  CREATININE 0.46* 0.45*  CALCIUM 8.2* 7.9*   PT/INR No results found for this basename: LABPROT:2,INR:2 in the last 72 hours   Studies/Results: Ct Guided Abscess Drain  05/31/2011  *RADIOLOGY REPORT*  Clinical Data: Intra-abdominal sub xyphoid abscess postoperatively  CT GUIDED INTRA-ABDOMINAL ABSCESS DRAINAGE  Date:  05/31/2011 11:23:00  Radiologist:  Judie Petit. Ruel Favors, M.D.  Medications:  2 mg Versed, 100 mcg Fentanyl  Guidance:  CT  Fluoroscopy time:  None.  Sedation time:  18 minutes  Contrast volume:  None.  Complications:  No immediate  PROCEDURE/FINDINGS:  Informed consent was obtained from the patient following explanation of the procedure, risks, benefits and alternatives. The patient understands, agrees and consents for the procedure. All questions were addressed.  A time out was performed.  Maximal barrier sterile technique  utilized including caps, mask, sterile gowns, sterile gloves, large sterile drape, hand hygiene, and betadine  Previous imaging was reviewed.  The midline sub xyphoid intra- abdominal abscess was localized with the patient supine utilizing noncontrast CT.  Under sterile conditions and local anesthesia, an 18 gauge 10 cm access needle was advanced from an anterior oblique approach into the fluid collection.  Syringe aspiration yielded purulent fluid consistent with an abscess.  Guide wire advanced. Guide wire position confirmed with CT.  Tract dilatation performed to advance a 12-French drain with the retention loop formed in the cavity.  Syringe aspiration yielded 20 ml of thick purulent fluid consistent with an abscess.  Sample sent for Gram stain and culture.  Catheter secured with a Prolene suture and connected to external suction bulb.  Sterile dressing applied.  IMPRESSION: Successful CT guided sub xyphoid intra-abdominal abscess drain placement.  Original Report Authenticated By: Judie Petit. Ruel Favors, M.D.    Anti-infectives: Anti-infectives     Start     Dose/Rate Route Frequency Ordered Stop   05/30/11 1130   ciprofloxacin (CIPRO) IVPB 400 mg        400 mg 200 mL/hr over 60 Minutes Intravenous 2 times daily 05/30/11 1038     05/29/11 2100   imipenem-cilastatin (PRIMAXIN) 500 mg in sodium chloride 0.9 % 100 mL IVPB        500 mg 200 mL/hr over 30 Minutes Intravenous Every 8 hours 05/29/11 2007  05/29/11 2100   vancomycin (VANCOCIN) 1,250 mg in sodium chloride 0.9 % 250 mL IVPB        1,250 mg 166.7 mL/hr over 90 Minutes Intravenous Every 24 hours 05/29/11 2007     05/07/11 1400   imipenem-cilastatin (PRIMAXIN) 500 mg in sodium chloride 0.9 % 100 mL IVPB        500 mg 200 mL/hr over 30 Minutes Intravenous 3 times per day 05/07/11 1106 05/21/11 1359   05/07/11 1200   micafungin (MYCAMINE) 100 mg in sodium chloride 0.9 % 100 mL IVPB  Status:  Discontinued     Comments: PER PHARMACY       100 mg 100 mL/hr over 1 Hours Intravenous Every 24 hours 05/07/11 1032 05/19/11 1103   05/07/11 1200   vancomycin (VANCOCIN) 1,250 mg in sodium chloride 0.9 % 250 mL IVPB  Status:  Discontinued        1,250 mg 166.7 mL/hr over 90 Minutes Intravenous Every 24 hours 05/07/11 1106 05/19/11 1103   05/07/11 1000   micafungin (MYCAMINE) 150 mg in sodium chloride 0.9 % 100 mL IVPB  Status:  Discontinued     Comments: PER PHARMACY      150 mg 100 mL/hr over 1 Hours Intravenous Daily 05/07/11 0951 05/07/11 1029   05/07/11 0300   ertapenem (INVANZ) 1 g in sodium chloride 0.9 % 50 mL IVPB        1 g 100 mL/hr over 30 Minutes Intravenous  Once 05/07/11 0300 05/07/11 0348   05/07/11 0130   cefTRIAXone (ROCEPHIN) 1 g in dextrose 5 % 50 mL IVPB        1 g 100 mL/hr over 30 Minutes Intravenous  Once 05/07/11 0122 05/07/11 0200         Current Facility-Administered Medications  Medication Dose Route Frequency Provider Last Rate Last Dose  . acetaminophen (TYLENOL) suppository 650 mg  650 mg Rectal Q6H PRN Ardeth Sportsman, MD   650 mg at 06/01/11 0027  . ciprofloxacin (CIPRO) IVPB 400 mg  400 mg Intravenous BID Iskra Magick-Myers   400 mg at 06/01/11 2328  . tpn solution (CLINIMIX E 5/15) 2,000 mL with multivitamins adult 10 mL, trace elements Cr-Cu-Mn-Se-Zn 1 mL, insulin regular 50 Units infusion   Intravenous Continuous TPN Lora Poteet Seay, PHARMD 80 mL/hr at 06/01/11 1723     And  . fat emulsion 20 % infusion 250 mL  250 mL Intravenous Continuous TPN Lora Poteet Seay, PHARMD 10 mL/hr at 06/01/11 1723 250 mL at 06/01/11 1723  . heparin injection 5,000 Units  5,000 Units Subcutaneous Q8H Daniel J. Feinstein   5,000 Units at 06/02/11 0654  . hydrALAZINE (APRESOLINE) injection 10 mg  10 mg Intravenous Q8H PRN Vassie Loll, MD      . imipenem-cilastatin (PRIMAXIN) 500 mg in sodium chloride 0.9 % 100 mL IVPB  500 mg Intravenous Q8H [facility], PHARMD   500 mg at 06/02/11 0450  . insulin  aspart (novoLOG) injection 0-9 Units  0-9 Units Subcutaneous Q6H Tad Moore [place], PHARMD   1 Units at 06/02/11 1610  . lip balm (CARMEX) ointment 1 application  1 application Topical BID Ardeth Sportsman, MD   1 application at 06/01/11 1023  . LORazepam (ATIVAN) injection 0.5 mg  0.5 mg Intravenous Q8H PRN Vassie Loll, MD   0.5 mg at 05/21/11 1239  . metoprolol (LOPRESSOR) injection 5 mg  5 mg Intravenous TID PRN Iskra Magick-Myers      . metoprolol  tartrate (LOPRESSOR) tablet 25 mg  25 mg Oral BID Iskra Magick-Myers   25 mg at 06/01/11 2328  . morphine 4 MG/ML injection 3 mg  3 mg Intravenous Q3H PRN Iskra Magick-Myers   3 mg at 06/02/11 0810  . pantoprazole (PROTONIX) injection 40 mg  40 mg Intravenous Q12H Ardeth Sportsman, MD   40 mg at 06/01/11 2328  . phenol (CHLORASEPTIC) mouth spray 1 spray  1 spray Mouth/Throat Q6H PRN Vassie Loll, MD      . sodium chloride 0.9 % injection 10 mL  10 mL Intracatheter PRN Wesam Yacoub   10 mL at 06/02/11 0610  . tpn solution (CLINIMIX E 5/15) 2,000 mL with insulin regular 50 Units infusion   Intravenous Continuous TPN Dannielle Huh, PHARMD 80 mL/hr at 05/31/11 1758    . vancomycin (VANCOCIN) 1,250 mg in sodium chloride 0.9 % 250 mL IVPB  1,250 mg Intravenous Q24H Maryanna Shape [place], PHARMD   1,250 mg at 06/02/11 0024    Assessment/Plan POD 25, EXP lap,lysis of adhesions, antrectomy, duodenal resection,B2,Jejunostomy, gastrotomy. 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  Patient Active Problem List  Diagnoses  . DIABETES MELLITUS, TYPE II  . HYPERLIPIDEMIA  . Obesity, unspecified  . DEPRESSION  . HYPERTENSION  . SINUSITIS, ACUTE  . ALLERGIC RHINITIS, SEASONAL  . OTHER CHRONIC BRONCHITIS  . DIVERTICULOSIS OF COLON  . ACUTE CYSTITIS  . INSOMNIA  . FATIGUE  . UNSTEADY GAIT  . HEADACHE  . Other urinary incontinence  . CLOSED FRACTURE OF OLECRANON PROCESS OF ULNA  . HEMATOMA  . HEADACHES, HX OF  .  Anorexia  . Anemia  . Syncope and collapse  . Hepatic steatosis  . Hyponatremia  . Gastric mass  . H pylori ulcer  . Ulcer, colon  . Acute renal failure  . S/P exploratory laparotomy  . Pericardial effusion  . Fever, postprocedural  . Confusion  . Leucocytosis  . Abdominal pain, acute  . Metabolic acidosis  . Esophagitis, erosive  . Acute respiratory failure  Plan:Continue Abx, TNA, drains, Pharmacy following for TNA. Appreciate Medicines help and transfusion.   LOS: 27 days    Mysty Kielty 06/02/2011

## 2011-06-02 NOTE — Progress Notes (Signed)
Patient examined and I agree with the assessment and plan  [name]  

## 2011-06-02 NOTE — Progress Notes (Signed)
Per MD, patient is ready to dc but needs SNF bed. CSW called Dr. Jacky Kindle and left a message regarding any progress made with [facility]. CSW called [facility] who stated they would be interested in looking at patient for referral. CSW updated FL2 and faxed referral with updated progress notes. SNF stated if they were able to accept patient then it would take 24-48 hours to get TNA supplies. CSW spoke with patient regarding her preference. Patient is agreeable to other placement options. CSW called [facility] d[facility] and left a message. CSW will continue to follow. Unk Lightning [phone]

## 2011-06-02 NOTE — Progress Notes (Signed)
26 Days Post-Op  Subjective: Patient still with some abd pain, occ nausea.  Objective: Vital signs in last 24 hours: Temp:  [98.3 F (36.8 C)-99.8 F (37.7 C)] 99.8 F (37.7 C) (12/04 0509) Pulse Rate:  [87-102] 87  (12/04 0509) Resp:  [18] 18  (12/04 0509) BP: (114-148)/(67-90) 142/78 mmHg (12/04 0509) SpO2:  [95 %-98 %] 97 % (12/04 0509) Last BM Date: 06/01/11  Intake/Output from previous day: 12/03 0701 - 12/04 0700 In: 350 [Blood:350] Out: 2210 [Urine:2050; Drains:160] Intake/Output this shift:    Epigastric drain intact, insertion site ok, mildly tender, about 20 cc's beige fluid in bulb Lab Results:   Basename 06/02/11 0610 06/01/11 1110  WBC 8.1 7.4  HGB 10.0* 9.9*  HCT 30.3* 29.2*  PLT 392 371   BMET  Basename 06/02/11 0610 06/01/11 0314  NA 131* 127*  K 3.6 3.6  CL 98 96  CO2 24 22  GLUCOSE 105* 140*  BUN 11 13  CREATININE 0.46* 0.45*  CALCIUM 8.2* 7.9*   PT/INR No results found for this basename: LABPROT:2,INR:2 in the last 72 hours ABG No results found for this basename: PHART:2,PCO2:2,PO2:2,HCO3:2 in the last 72 hours Results for orders placed during the hospital encounter of 05/06/11  SURGICAL PCR SCREEN     Status: Abnormal   Collection Time   05/07/11  9:30 AM      Component Value Range Status Comment   MRSA, PCR NEGATIVE  NEGATIVE  Final    Staphylococcus aureus POSITIVE (*) NEGATIVE  Final   URINE CULTURE     Status: Normal   Collection Time   05/07/11 10:20 AM      Component Value Range Status Comment   Specimen Description URINE, RANDOM   Final    Special Requests NONE   Final    Setup Time [id number]   Final    Colony Count NO GROWTH   Final    Culture NO GROWTH   Final    Report Status 05/08/2011 FINAL   Final   CULTURE, BLOOD (ROUTINE X 2)     Status: Normal   Collection Time   05/07/11 11:20 AM      Component Value Range Status Comment   Specimen Description BLOOD   Final    Special Requests     Final    Value: BOTTLES DRAWN  AEROBIC AND ANAEROBIC 5CC RT SUBCLAVIAN VEIN   Setup Time [id number]   Final    Culture NO GROWTH 5 DAYS   Final    Report Status 05/13/2011 FINAL   Final   CULTURE, BLOOD (ROUTINE X 2)     Status: Normal   Collection Time   05/07/11 11:20 AM      Component Value Range Status Comment   Specimen Description BLOOD   Final    Special Requests     Final    Value: BOTTLES DRAWN AEROBIC AND ANAEROBIC 5CC RT SUBCLAVIAN VEIN   Setup Time [id number]   Final    Culture NO GROWTH 5 DAYS   Final    Report Status 05/13/2011 FINAL   Final   CULTURE, RESPIRATORY     Status: Normal   Collection Time   05/13/11  6:02 AM      Component Value Range Status Comment   Specimen Description ENDOTRACHEAL   Final    Special Requests NONE   Final    Gram Stain     Final    Value: FEW WBC PRESENT, PREDOMINANTLY PMN  RARE SQUAMOUS EPITHELIAL CELLS PRESENT     NO ORGANISMS SEEN   Culture NO GROWTH 2 DAYS   Final    Report Status 05/15/2011 FINAL   Final   URINE CULTURE     Status: Normal   Collection Time   05/30/11  5:34 AM      Component Value Range Status Comment   Specimen Description URINE, CATHETERIZED   Final    Special Requests NONE   Final    Setup Time [id number]   Final    Colony Count >=100,000 COLONIES/ML   Final    Culture     Final    Value: PSEUDOMONAS AERUGINOSA     ESCHERICHIA COLI   Report Status 06/02/2011 FINAL   Final    Organism ID, Bacteria PSEUDOMONAS AERUGINOSA   Final    Organism ID, Bacteria ESCHERICHIA COLI   Final   CULTURE, ROUTINE-ABSCESS     Status: Normal (Preliminary result)   Collection Time   05/31/11  1:29 PM      Component Value Range Status Comment   Specimen Description ABSCESS ABDOMEN RUQ   Final    Special Requests PATIENT ON FOLLOWING VANC, CIRPO, PRIMAXIN   Final    Gram Stain     Final    Value: NO WBC SEEN     NO SQUAMOUS EPITHELIAL CELLS SEEN     NO ORGANISMS SEEN   Culture NO GROWTH 2 DAYS   Final    Report Status PENDING   Incomplete      Studies/Results: Ct Guided Abscess Drain  05/31/2011  *RADIOLOGY REPORT*  Clinical Data: Intra-abdominal sub xyphoid abscess postoperatively  CT GUIDED INTRA-ABDOMINAL ABSCESS DRAINAGE  Date:  05/31/2011 11:23:00  Radiologist:  Judie Petit. Ruel Favors, M.D.  Medications:  2 mg Versed, 100 mcg Fentanyl  Guidance:  CT  Fluoroscopy time:  None.  Sedation time:  18 minutes  Contrast volume:  None.  Complications:  No immediate  PROCEDURE/FINDINGS:  Informed consent was obtained from the patient following explanation of the procedure, risks, benefits and alternatives. The patient understands, agrees and consents for the procedure. All questions were addressed.  A time out was performed.  Maximal barrier sterile technique utilized including caps, mask, sterile gowns, sterile gloves, large sterile drape, hand hygiene, and betadine  Previous imaging was reviewed.  The midline sub xyphoid intra- abdominal abscess was localized with the patient supine utilizing noncontrast CT.  Under sterile conditions and local anesthesia, an 18 gauge 10 cm access needle was advanced from an anterior oblique approach into the fluid collection.  Syringe aspiration yielded purulent fluid consistent with an abscess.  Guide wire advanced. Guide wire position confirmed with CT.  Tract dilatation performed to advance a 12-French drain with the retention loop formed in the cavity.  Syringe aspiration yielded 20 ml of thick purulent fluid consistent with an abscess.  Sample sent for Gram stain and culture.  Catheter secured with a Prolene suture and connected to external suction bulb.  Sterile dressing applied.  IMPRESSION: Successful CT guided sub xyphoid intra-abdominal abscess drain placement.  Original Report Authenticated By: Judie Petit. Ruel Favors, M.D.    Anti-infectives: Anti-infectives     Start     Dose/Rate Route Frequency Ordered Stop   05/30/11 1130   ciprofloxacin (CIPRO) IVPB 400 mg        400 mg 200 mL/hr over 60 Minutes Intravenous  2 times daily 05/30/11 1038     05/29/11 2100   imipenem-cilastatin (PRIMAXIN) 500 mg in sodium  chloride 0.9 % 100 mL IVPB        500 mg 200 mL/hr over 30 Minutes Intravenous Every 8 hours 05/29/11 2007     05/29/11 2100   vancomycin (VANCOCIN) 1,250 mg in sodium chloride 0.9 % 250 mL IVPB        1,250 mg 166.7 mL/hr over 90 Minutes Intravenous Every 24 hours 05/29/11 2007     05/07/11 1400   imipenem-cilastatin (PRIMAXIN) 500 mg in sodium chloride 0.9 % 100 mL IVPB        500 mg 200 mL/hr over 30 Minutes Intravenous 3 times per day 05/07/11 1106 05/21/11 1359   05/07/11 1200   micafungin (MYCAMINE) 100 mg in sodium chloride 0.9 % 100 mL IVPB  Status:  Discontinued     Comments: PER PHARMACY      100 mg 100 mL/hr over 1 Hours Intravenous Every 24 hours 05/07/11 1032 05/19/11 1103   05/07/11 1200   vancomycin (VANCOCIN) 1,250 mg in sodium chloride 0.9 % 250 mL IVPB  Status:  Discontinued        1,250 mg 166.7 mL/hr over 90 Minutes Intravenous Every 24 hours 05/07/11 1106 05/19/11 1103   05/07/11 1000   micafungin (MYCAMINE) 150 mg in sodium chloride 0.9 % 100 mL IVPB  Status:  Discontinued     Comments: PER PHARMACY      150 mg 100 mL/hr over 1 Hours Intravenous Daily 05/07/11 0951 05/07/11 1029   05/07/11 0300   ertapenem (INVANZ) 1 g in sodium chloride 0.9 % 50 mL IVPB        1 g 100 mL/hr over 30 Minutes Intravenous  Once 05/07/11 0300 05/07/11 0348   05/07/11 0130   cefTRIAXone (ROCEPHIN) 1 g in dextrose 5 % 50 mL IVPB        1 g 100 mL/hr over 30 Minutes Intravenous  Once 05/07/11 0122 05/07/11 0200          Assessment/Plan: S/p epigastric abscess drainage 12/2; check cx's; continue drain flushes; check f/u CT within week of placement.      LOS: 27 days    Jashaun Penrose,D [facility] 06/02/2011

## 2011-06-02 NOTE — Progress Notes (Signed)
Patient ID: [name], female   DOB: [birth year], 77 y.o.   MRN: [id number]  Subjective: No events overnight. Patient denies chest pain, shortness of breath, abdominal pain.   Objective:  Vital signs in last 24 hours:  Filed Vitals:   06/01/11 1400 06/01/11 2142 06/02/11 0509 06/02/11 1400  BP: 114/67 148/90 142/78 138/78  Pulse: 87 94 87 96  Temp: 98.8 F (37.1 C) 98.3 F (36.8 C) 99.8 F (37.7 C) 99 F (37.2 C)  TempSrc: Oral   Axillary  Resp:  18 18 20   Height:      Weight:      SpO2: 98% 95% 97% 99%    Intake/Output from previous day:   Intake/Output Summary (Last 24 hours) at 06/02/11 1604 Last data filed at 06/02/11 1500  Gross per 24 hour  Intake      0 ml  Output   2520 ml  Net  -2520 ml   Physical Exam: General: Alert, awake, follows commands appropriately, in no acute distress. HEENT: No bruits, no goiter. Moist mucous membranes, no scleral icterus, no conjunctival pallor. Heart: Regular rate and rhythm, without murmurs, rubs, gallops. Lungs: Clear to auscultation bilaterally with minimal bibasilar crackles. No wheezing, no rhonchi, no rales.  Abdomen: Soft, nontender, nondistended, positive bowel sounds. Extremities: No clubbing cyanosis or edema,  positive pedal pulses. Neuro: Grossly intact, nonfocal.  Lab Results:  Basic Metabolic Panel:    Component Value Date/Time   NA 131* 06/02/2011 0610   K 3.6 06/02/2011 0610   CL 98 06/02/2011 0610   CO2 24 06/02/2011 0610   BUN 11 06/02/2011 0610   CREATININE 0.46* 06/02/2011 0610   GLUCOSE 105* 06/02/2011 0610   CALCIUM 8.2* 06/02/2011 0610   CBC:    Component Value Date/Time   WBC 8.1 06/02/2011 0610   HGB 10.0* 06/02/2011 0610   HCT 30.3* 06/02/2011 0610   PLT 392 06/02/2011 0610   MCV 83.2 06/02/2011 0610   NEUTROABS 5.4 06/01/2011 1110   LYMPHSABS 1.0 06/01/2011 1110   MONOABS 0.5 06/01/2011 1110   EOSABS 0.4 06/01/2011 1110   BASOSABS 0.0 06/01/2011 1110      Lab 06/02/11 0610 06/01/11 1110 05/31/11  0613 05/30/11 0500 05/29/11 0505  WBC 8.1 7.4 8.9 13.5* 8.0  HGB 10.0* 9.9* 7.3* 8.0* 7.9*  HCT 30.3* 29.2* 22.9* 25.0* 25.1*  PLT 392 371 393 425* 421*  MCV 83.2 82.3 81.2 81.7 81.8  MCH 27.5 27.9 25.9* 26.1 25.7*  MCHC 33.0 33.9 31.9 32.0 31.5  RDW 18.6* 18.3* 21.0* 20.9* 21.1*  LYMPHSABS -- 1.0 -- -- --  MONOABS -- 0.5 -- -- --  EOSABS -- 0.4 -- -- --  BASOSABS -- 0.0 -- -- --  BANDABS -- -- -- -- --    Lab 06/02/11 0610 06/01/11 0314 05/31/11 0613 05/30/11 0500 05/29/11 0505 05/27/11 0520  NA 131* 127* 125* 123* 127* --  K 3.6 3.6 3.5 3.9 4.1 --  CL 98 96 95* 93* 93* --  CO2 24 22 22 22 25  --  GLUCOSE 105* 140* 143* 172* 146* --  BUN 11 13 16 17 16  --  CREATININE 0.46* 0.45* 0.49* 0.51 0.48* --  CALCIUM 8.2* 7.9* 8.0* 7.8* 8.2* --  MG -- 1.5 -- 2.0 1.6 1.6   No results found for this basename: INR:5,PROTIME:5 in the last 168 hours Cardiac markers:  Lab 06/01/11 1013 06/01/11 0313 05/31/11 1813  CKMB 1.7 1.5 1.6  TROPONINI <0.30 <0.30 <0.30  MYOGLOBIN -- -- --  No results found for this basename: POCBNP:3 in the last 168 hours Recent Results (from the past 240 hour(s))  URINE CULTURE     Status: Normal   Collection Time   05/30/11  5:34 AM      Component Value Range Status Comment   Specimen Description URINE, CATHETERIZED   Final    Special Requests NONE   Final    Setup Time [id number]   Final    Colony Count >=100,000 COLONIES/ML   Final    Culture     Final    Value: PSEUDOMONAS AERUGINOSA     ESCHERICHIA COLI   Report Status 06/02/2011 FINAL   Final    Organism ID, Bacteria PSEUDOMONAS AERUGINOSA   Final    Organism ID, Bacteria ESCHERICHIA COLI   Final   CULTURE, ROUTINE-ABSCESS     Status: Normal (Preliminary result)   Collection Time   05/31/11  1:29 PM      Component Value Range Status Comment   Specimen Description ABSCESS ABDOMEN RUQ   Final    Special Requests PATIENT ON FOLLOWING VANC, CIRPO, PRIMAXIN   Final    Gram Stain     Final    Value:  NO WBC SEEN     NO SQUAMOUS EPITHELIAL CELLS SEEN     NO ORGANISMS SEEN   Culture NO GROWTH 2 DAYS   Final    Report Status PENDING   Incomplete     Studies/Results: No results found.  Medications: Scheduled Meds:   . ciprofloxacin  400 mg Intravenous BID  . heparin subcutaneous  5,000 Units Subcutaneous Q8H  . imipenem-cilastatin  500 mg Intravenous Q8H  . insulin aspart  0-9 Units Subcutaneous Q6H  . lip balm  1 application Topical BID  . metoprolol tartrate  25 mg Oral BID  . pantoprazole (PROTONIX) IV  40 mg Intravenous Q12H  . vancomycin  1,250 mg Intravenous Q24H   Continuous Infusions:   . TPN (CLINIMIX) +/- additives 80 mL/hr at 06/01/11 1723   And  . fat emulsion 250 mL (06/01/11 1723)  . TPN (CLINIMIX) +/- additives     PRN Meds:.acetaminophen, hydrALAZINE, LORazepam, metoprolol, morphine injection, phenol, sodium chloride  Assessment/Plan:  Principal Problem:  *Abdominal pain, acute - appears to be clinically improving - continue TNA for now  Active Problems:  DIABETES MELLITUS, TYPE II - well controlled   HYPERTENSION - stable   Anemia - Hg/Hct remain stable and at pt's baseline   Hyponatremia - improving   Metabolic acidosis - resolved   Esophagitis, erosive - continue Protonix   Disposition - pt is OK for d/c when bed is available - per surgery recommendation pt will need follow up CT of the abdomen and pelvis in 1 week    LOS: 27 days   MAGICK-Alycen Mack 06/02/2011, 4:04 PM

## 2011-06-03 DIAGNOSIS — E46 Unspecified protein-calorie malnutrition: Secondary | ICD-10-CM

## 2011-06-03 DIAGNOSIS — A419 Sepsis, unspecified organism: Secondary | ICD-10-CM

## 2011-06-03 LAB — CBC
Hemoglobin: 10.3 g/dL — ABNORMAL LOW (ref 12.0–15.0)
MCH: 27.8 pg (ref 26.0–34.0)
MCV: 83.3 fL (ref 78.0–100.0)
Platelets: 371 10*3/uL (ref 150–400)
RBC: 3.71 MIL/uL — ABNORMAL LOW (ref 3.87–5.11)
WBC: 9.3 10*3/uL (ref 4.0–10.5)

## 2011-06-03 LAB — BASIC METABOLIC PANEL
CO2: 25 mEq/L (ref 19–32)
Chloride: 97 mEq/L (ref 96–112)
Creatinine, Ser: 0.41 mg/dL — ABNORMAL LOW (ref 0.50–1.10)
Glucose, Bld: 146 mg/dL — ABNORMAL HIGH (ref 70–99)

## 2011-06-03 LAB — CULTURE, ROUTINE-ABSCESS

## 2011-06-03 LAB — GLUCOSE, CAPILLARY
Glucose-Capillary: 131 mg/dL — ABNORMAL HIGH (ref 70–99)
Glucose-Capillary: 131 mg/dL — ABNORMAL HIGH (ref 70–99)
Glucose-Capillary: 139 mg/dL — ABNORMAL HIGH (ref 70–99)
Glucose-Capillary: 132 mg/dL — ABNORMAL HIGH (ref 70–99)

## 2011-06-03 MED ORDER — POTASSIUM CHLORIDE 10 MEQ/100ML IV SOLN
10.0000 meq | INTRAVENOUS | Status: AC
  Administered 2011-06-03 – 2011-06-04 (×3): 10 meq via INTRAVENOUS
  Filled 2011-06-03 (×3): qty 100

## 2011-06-03 MED ORDER — TRACE MINERALS CR-CU-MN-SE-ZN 10-1000-500-60 MCG/ML IV SOLN
INTRAVENOUS | Status: AC
  Administered 2011-06-03: 18:00:00 via INTRAVENOUS
  Filled 2011-06-03: qty 2000

## 2011-06-03 MED ORDER — CIPROFLOXACIN IN D5W 400 MG/200ML IV SOLN: 400.0000 mg | Freq: Two times a day (BID) | INTRAVENOUS | Status: DC

## 2011-06-03 MED ORDER — FAT EMULSION 20 % IV EMUL
250.0000 mL | INTRAVENOUS | Status: AC
  Administered 2011-06-03: 250 mL via INTRAVENOUS
  Filled 2011-06-03: qty 250

## 2011-06-03 NOTE — Progress Notes (Addendum)
Nutrition Follow-up  Patient is receiving TPN with Clinimix E 5/15 @ 80 ml/hr.  Lipids (20% IVFE @ 10 ml/hr), multivitamins, and trace elements are provided 3 times weekly (MWF) due to national backorder.  Provides 1,568 kcal and 96 grams protein daily (based on weekly average).  Meets 88% minimum estimated kcal and 100% minimum estimated protein needs.  Meds: Scheduled Meds:   . heparin subcutaneous  5,000 Units Subcutaneous Q8H  . imipenem-cilastatin  500 mg Intravenous Q8H  . insulin aspart  0-9 Units Subcutaneous Q6H  . lip balm  1 application Topical BID  . metoprolol  5 mg Intravenous Q12H  . pantoprazole (PROTONIX) IV  40 mg Intravenous Q12H  . DISCONTD: ciprofloxacin  400 mg Intravenous BID  . DISCONTD: metoprolol tartrate  25 mg Oral BID  . DISCONTD: vancomycin  1,250 mg Intravenous Q24H   Continuous Infusions:   . TPN (CLINIMIX) +/- additives 80 mL/hr at 06/01/11 1723   And  . fat emulsion 250 mL (06/01/11 1723)  . TPN (CLINIMIX) +/- additives     And  . fat emulsion    . TPN (CLINIMIX) +/- additives 80 mL/hr at 06/02/11 1808   PRN Meds:.acetaminophen, hydrALAZINE, LORazepam, morphine injection, phenol, sodium chloride, DISCONTD: metoprolol  Labs:  CMP     Component Value Date/Time   NA 129* 06/03/2011 0615   K 3.2* 06/03/2011 0615   CL 97 06/03/2011 0615   CO2 25 06/03/2011 0615   GLUCOSE 146* 06/03/2011 0615   BUN 12 06/03/2011 0615   CREATININE 0.41* 06/03/2011 0615   CALCIUM 8.5 06/03/2011 0615   PROT 5.6* 06/01/2011 0314   ALBUMIN 1.5* 06/01/2011 0314   AST 14 06/01/2011 0314   ALT 11 06/01/2011 0314   ALKPHOS 92 06/01/2011 0314   BILITOT 0.1* 06/01/2011 0314   GFRNONAA >90 06/03/2011 0615   GFRAA >90 06/03/2011 0615     Intake/Output Summary (Last 24 hours) at 06/03/11 1448 Last data filed at 06/03/11 0500  Gross per 24 hour  Intake      0 ml  Output   2605 ml  Net  -2605 ml    Weight Status:  77.2 kg -- up due to edema (12/1)  Nutrition Dx:  Inadequate  Oral Intake, ongoing  Goal:  TPN to meet 100% of protein needs, maximize energy provision as able during national lipid backorder, met  Intervention:  TPN per pharmacy  Monitor: TPN  Kcal/protein provision, weight, labs, I/O's   Alger Memos Pager #:  [phone]

## 2011-06-03 NOTE — Progress Notes (Signed)
CSW spoke with Dr Jacky Kindle who stated if SNF could accept patient then she could be admitted there. CSW faxed additional information to SNF regarding TNA and labs. CSW spoke with patient who stated she would go to [facility]. CSW called patient's step dtr per patient's request and explained SNF. Step dtr is agreeable to this plan as well. Patient cannot dc until SNF is able to get TNA supplies. CSW will continue to follow. [place], [place] [phone]

## 2011-06-03 NOTE — Progress Notes (Signed)
Patient was discussed during Long LOS meeting. It was discussed that due to patient's medical needs that LTAC would be more appropriate rather than SNF. CSW will continue to follow to assist with any needs. Unk Lightning [phone]

## 2011-06-03 NOTE — Progress Notes (Signed)
27 Days Post-Op  Subjective: Patient resting quietly on right side, c/o abd. pain, mumbling "Oh my God"  Objective: Vital signs in last 24 hours: Temp:  [99 F (37.2 C)-99.7 F (37.6 C)] 99.3 F (37.4 C) (12/05 0512) Pulse Rate:  [96-109] 105  (12/05 0512) Resp:  [18-20] 18  (12/05 0512) BP: (138-161)/(78-88) 161/88 mmHg (12/05 0512) SpO2:  [95 %-99 %] 96 % (12/05 0512) Last BM Date: 06/02/11  Intake/Output from previous day: 12/04 0701 - 12/05 0700 In: 100 [IV Piggyback:100] Out: 2540 [Urine:2500; Drains:40] Intake/Output this shift:  epigastric drain intact, draining about 25 cc's purulent beige fluid, insertion site ok, cx's with no growth    Lab Results:   [facility] 06/03/11 0615 06/02/11 0610  WBC 9.3 8.1  HGB 10.3* 10.0*  HCT 30.9* 30.3*  PLT 371 392   BMET  Basename 06/03/11 0615 06/02/11 0610  NA 129* 131*  K 3.2* 3.6  CL 97 98  CO2 25 24  GLUCOSE 146* 105*  BUN 12 11  CREATININE 0.41* 0.46*  CALCIUM 8.5 8.2*   PT/INR No results found for this basename: LABPROT:2,INR:2 in the last 72 hours ABG No results found for this basename: PHART:2,PCO2:2,PO2:2,HCO3:2 in the last 72 hours  Studies/Results: No results found. Results for orders placed during the hospital encounter of 05/06/11  SURGICAL PCR SCREEN     Status: Abnormal   Collection Time   05/07/11  9:30 AM      Component Value Range Status Comment   MRSA, PCR NEGATIVE  NEGATIVE  Final    Staphylococcus aureus POSITIVE (*) NEGATIVE  Final   URINE CULTURE     Status: Normal   Collection Time   05/07/11 10:20 AM      Component Value Range Status Comment   Specimen Description URINE, RANDOM   Final    Special Requests NONE   Final    Setup Time [id number]   Final    Colony Count NO GROWTH   Final    Culture NO GROWTH   Final    Report Status 05/08/2011 FINAL   Final   CULTURE, BLOOD (ROUTINE X 2)     Status: Normal   Collection Time   05/07/11 11:20 AM      Component Value Range Status  Comment   Specimen Description BLOOD   Final    Special Requests     Final    Value: BOTTLES DRAWN AEROBIC AND ANAEROBIC 5CC RT SUBCLAVIAN VEIN   Setup Time [id number]   Final    Culture NO GROWTH 5 DAYS   Final    Report Status 05/13/2011 FINAL   Final   CULTURE, BLOOD (ROUTINE X 2)     Status: Normal   Collection Time   05/07/11 11:20 AM      Component Value Range Status Comment   Specimen Description BLOOD   Final    Special Requests     Final    Value: BOTTLES DRAWN AEROBIC AND ANAEROBIC 5CC RT SUBCLAVIAN VEIN   Setup Time [id number]   Final    Culture NO GROWTH 5 DAYS   Final    Report Status 05/13/2011 FINAL   Final   CULTURE, RESPIRATORY     Status: Normal   Collection Time   05/13/11  6:02 AM      Component Value Range Status Comment   Specimen Description ENDOTRACHEAL   Final    Special Requests NONE   Final    Gram Stain  Final    Value: FEW WBC PRESENT, PREDOMINANTLY PMN     RARE SQUAMOUS EPITHELIAL CELLS PRESENT     NO ORGANISMS SEEN   Culture NO GROWTH 2 DAYS   Final    Report Status 05/15/2011 FINAL   Final   URINE CULTURE     Status: Normal   Collection Time   05/30/11  5:34 AM      Component Value Range Status Comment   Specimen Description URINE, CATHETERIZED   Final    Special Requests NONE   Final    Setup Time [id number]   Final    Colony Count >=100,000 COLONIES/ML   Final    Culture     Final    Value: PSEUDOMONAS AERUGINOSA     ESCHERICHIA COLI   Report Status 06/02/2011 FINAL   Final    Organism ID, Bacteria PSEUDOMONAS AERUGINOSA   Final    Organism ID, Bacteria ESCHERICHIA COLI   Final   CULTURE, ROUTINE-ABSCESS     Status: Normal   Collection Time   05/31/11  1:29 PM      Component Value Range Status Comment   Specimen Description ABSCESS ABDOMEN RUQ   Final    Special Requests PATIENT ON FOLLOWING VANC, CIRPO, PRIMAXIN   Final    Gram Stain     Final    Value: NO WBC SEEN     NO SQUAMOUS EPITHELIAL CELLS SEEN     NO ORGANISMS SEEN     Culture NO GROWTH 3 DAYS   Final    Report Status 06/03/2011 FINAL   Final     Anti-infectives: Anti-infectives     Start     Dose/Rate Route Frequency Ordered Stop   05/30/11 1130   ciprofloxacin (CIPRO) IVPB 400 mg  Status:  Discontinued        400 mg 200 mL/hr over 60 Minutes Intravenous 2 times daily 05/30/11 1038 06/02/11 1618   05/29/11 2100   imipenem-cilastatin (PRIMAXIN) 500 mg in sodium chloride 0.9 % 100 mL IVPB        500 mg 200 mL/hr over 30 Minutes Intravenous Every 8 hours 05/29/11 2007     05/29/11 2100   vancomycin (VANCOCIN) 1,250 mg in sodium chloride 0.9 % 250 mL IVPB        1,250 mg 166.7 mL/hr over 90 Minutes Intravenous Every 24 hours 05/29/11 2007     05/07/11 1400   imipenem-cilastatin (PRIMAXIN) 500 mg in sodium chloride 0.9 % 100 mL IVPB        500 mg 200 mL/hr over 30 Minutes Intravenous 3 times per day 05/07/11 1106 05/21/11 1359   05/07/11 1200   micafungin (MYCAMINE) 100 mg in sodium chloride 0.9 % 100 mL IVPB  Status:  Discontinued     Comments: PER PHARMACY      100 mg 100 mL/hr over 1 Hours Intravenous Every 24 hours 05/07/11 1032 05/19/11 1103   05/07/11 1200   vancomycin (VANCOCIN) 1,250 mg in sodium chloride 0.9 % 250 mL IVPB  Status:  Discontinued        1,250 mg 166.7 mL/hr over 90 Minutes Intravenous Every 24 hours 05/07/11 1106 05/19/11 1103   05/07/11 1000   micafungin (MYCAMINE) 150 mg in sodium chloride 0.9 % 100 mL IVPB  Status:  Discontinued     Comments: PER PHARMACY      150 mg 100 mL/hr over 1 Hours Intravenous Daily 05/07/11 0951 05/07/11 1029   05/07/11 0300   ertapenem (INVANZ)  1 g in sodium chloride 0.9 % 50 mL IVPB        1 g 100 mL/hr over 30 Minutes Intravenous  Once 05/07/11 0300 05/07/11 0348   05/07/11 0130   cefTRIAXone (ROCEPHIN) 1 g in dextrose 5 % 50 mL IVPB        1 g 100 mL/hr over 30 Minutes Intravenous  Once 05/07/11 0122 05/07/11 0200          Assessment/Plan: S/p epigastric abscess drainage  12/2; continue current treatment; check f/u CT early next week unless clinically worsens.      LOS: 28 days    Julius Boniface,D [facility] 06/03/2011

## 2011-06-03 NOTE — Progress Notes (Signed)
Subjective: Patient lying in bed in not acute distress. She denies any abdominal pain to me. She said helped me. She denies shortness of breath. We changed her position in the bed, and she was more comfortable. Objective: Filed Vitals:   06/02/11 1400 06/02/11 2114 06/03/11 0512 06/03/11 1500  BP: 138/78 148/87 161/88 107/71  Pulse: 96 109 105 100  Temp: 99 F (37.2 C) 99.7 F (37.6 C) 99.3 F (37.4 C) 99.4 F (37.4 C)  TempSrc: Axillary     Resp: 20 18 18 18   Height:      Weight:      SpO2: 99% 95% 96% 95%   Weight change:   Intake/Output Summary (Last 24 hours) at 06/03/11 2059 Last data filed at 06/03/11 1700  Gross per 24 hour  Intake      0 ml  Output   1515 ml  Net  -1515 ml    General: Alert, awake, oriented x3, in no acute distress.  HEENT: No bruits, no goiter.  Heart: Regular rate and rhythm, without murmurs, rubs, gallops.  Lungs: Crackles left side, bilateral air movement.  Abdomen: Soft, mild tenderness, nondistended, positive bowel sounds. Dressing, colostomy, JP drainage.  Extremities no edema.   Lab Results:  Basename 06/03/11 0615 06/02/11 0610 06/01/11 0314  NA 129* 131* --  K 3.2* 3.6 --  CL 97 98 --  CO2 25 24 --  GLUCOSE 146* 105* --  BUN 12 11 --  CREATININE 0.41* 0.46* --  CALCIUM 8.5 8.2* --  MG -- -- 1.5  PHOS -- -- 3.9    Basename 06/01/11 0314  AST 14  ALT 11  ALKPHOS 92  BILITOT 0.1*  PROT 5.6*  ALBUMIN 1.5*    Basename 06/03/11 0615 06/02/11 0610 06/01/11 1110  WBC 9.3 8.1 --  NEUTROABS -- -- 5.4  HGB 10.3* 10.0* --  HCT 30.9* 30.3* --  MCV 83.3 83.2 --  PLT 371 392 --    Basename 06/01/11 1013 06/01/11 0313  CKTOTAL 13 15  CKMB 1.7 1.5  CKMBINDEX -- --  TROPONINI <0.30 <0.30    Basename 06/01/11 0314  CHOL --  HDL --  LDLCALC --  TRIG 119  CHOLHDL --  LDLDIRECT --   Micro Results: Recent Results (from the past 240 hour(s))  URINE CULTURE     Status: Normal   Collection Time   05/30/11  5:34 AM   Component Value Range Status Comment   Specimen Description URINE, CATHETERIZED   Final    Special Requests NONE   Final    Setup Time [id number]   Final    Colony Count >=100,000 COLONIES/ML   Final    Culture     Final    Value: PSEUDOMONAS AERUGINOSA     ESCHERICHIA COLI   Report Status 06/02/2011 FINAL   Final    Organism ID, Bacteria PSEUDOMONAS AERUGINOSA   Final    Organism ID, Bacteria ESCHERICHIA COLI   Final   CULTURE, ROUTINE-ABSCESS     Status: Normal   Collection Time   05/31/11  1:29 PM      Component Value Range Status Comment   Specimen Description ABSCESS ABDOMEN RUQ   Final    Special Requests PATIENT ON FOLLOWING VANC, CIRPO, PRIMAXIN   Final    Gram Stain     Final    Value: NO WBC SEEN     NO SQUAMOUS EPITHELIAL CELLS SEEN     NO ORGANISMS SEEN   Culture NO GROWTH  3 DAYS   Final    Report Status 06/03/2011 FINAL   Final     Studies/Results: No results found.  Medications: I have reviewed the patient's current medications.  [name] is a 77 y.o. female transferred from [facility] on 05/06/2011 with headache and abd pain. Found to have intraperitoneal free air, and perforated pylorus complicated by septic shock and VDRF.    Patient Active Hospital Problem List:  Abdominal pain, acute (05/07/2011) acute: secondary to recent bowel perforation and s/p surgery S/p epigastric abscess drainage 12/2; IR Recommend  f/u CT early next week unless clinically worsens.  Patient on imipenem. Will ask Surgery course of antibiotics.  Imipenem stared Nov 8 and stopped nov 22. Then restarted Nov 30.  Sepsis (05/07/2011), Erosive esophagitis and acute abdomen. Assessment: Due to a perforated pyloric ulcer s/p laparotomy 11/07.  DIABETES MELLITUS, TYPE II (09/13/2007) SSI.  HYPERTENSION (09/13/2007)  hydralazine PRN.  Anemia (01/26/2011) Stable.  Hyponatremia (01/28/2011) Stable. Monitor.   Acute respiratory failure (05/12/2011) Resolved.  ETT 11/8>>> 11/18. UTI : urine  with E coli and cipro. Ua 12-01. Suspect imipenem will cover both.  Hypokalemia:  Replete with kcl IV times 3 runs.    LOS: 28 days   Carter Kassel M.D.  [facility] 06/03/2011, 8:59 PM

## 2011-06-03 NOTE — Progress Notes (Signed)
27 Days Post-Op  Subjective: "Help me lord" no other c/o verbalized.  Objective: Vital signs in last 24 hours: Temp:  [99 F (37.2 C)-99.7 F (37.6 C)] 99.3 F (37.4 C) (12/05 0512) Pulse Rate:  [96-109] 105  (12/05 0512) Resp:  [18-20] 18  (12/05 0512) BP: (138-161)/(78-88) 161/88 mmHg (12/05 0512) SpO2:  [95 %-99 %] 96 % (12/05 0512) Last BM Date: 06/02/11  Intake/Output this shift:    Physical Exam: BP 161/88  Pulse 105  Temp(Src) 99.3 F (37.4 C) (Axillary)  Resp 18  Ht 5\' 6"  (1.676 m)  Wt 170 lb 3.1 oz (77.2 kg)  BMI 27.47 kg/m2  SpO2 96% ABdomen: wound clean, contracting; G-tube intcat to gravity J-tube site/wd with decreased output. JP drain still with milky purulent output.  Labs: CBC  Basename 06/03/11 0615 06/02/11 0610  WBC 9.3 8.1  HGB 10.3* 10.0*  HCT 30.9* 30.3*  PLT 371 392   BMET  Basename 06/03/11 0615 06/02/11 0610  NA 129* 131*  K 3.2* 3.6  CL 97 98  CO2 25 24  GLUCOSE 146* 105*  BUN 12 11  CREATININE 0.41* 0.46*  CALCIUM 8.5 8.2*   LFT  Basename 06/01/11 0314  PROT 5.6*  ALBUMIN 1.5*  AST 14  ALT 11  ALKPHOS 92  BILITOT 0.1*  BILIDIR --  IBILI --  LIPASE --   PT/INR No results found for this basename: LABPROT:2,INR:2 in the last 72 hours ABG No results found for this basename: PHART:2,PCO2:2,PO2:2,HCO3:2 in the last 72 hours  Studies/Results: No results found.  Assessment: Principal Problem:  *Abdominal pain, acute Active Problems:  DIABETES MELLITUS, TYPE II  HYPERTENSION  Anemia  Hyponatremia  Metabolic acidosis  Esophagitis, erosive  Acute respiratory failure   Procedure(s): EXPLORATORY LAPAROTOMY GASTROSTOMY GASTRECTOMY CHOLECYSTECTOMY  Plan: COntinue TPN, Drains functional Abx Follow up CT next week.  LOS: 28 days    Marianna Fuss 06/03/2011

## 2011-06-03 NOTE — Progress Notes (Signed)
CSW spoke with [facility] who stated they felt they could handle patient's needs and made a bed offer. CSW called supervisor, [facility], who requested CSW speak with Dr. Jacky Kindle. CSW called and left Dr. Jacky Kindle a message to determine if SNF is appropriate for patient. CSW will continue to follow. Unk Lightning [phone]

## 2011-06-03 NOTE — Progress Notes (Signed)
Physical Therapy Treatment [name] MRN: [id number] DOB: [birth year] Today's Date: 06/03/2011  PT Assessment/Plan  PT - Assessment/Plan Comments on Treatment Session: Pt continues to be limited by pain. States "i need help" and "oh god".  PT Plan: Discharge plan remains appropriate PT Frequency: Min 3X/week Follow Up Recommendations: Skilled nursing facility Equipment Recommended: Defer to next venue PT Goals  Acute Rehab PT Goals PT Goal: Supine/Side to Sit - Progress: Met PT Transfer Goal: Bed to Chair/Chair to Bed - Progress: Progressing toward goal (slowly) PT Goal: Ambulate - Progress: Not met  PT Treatment Precautions/Restrictions  Precautions Precautions: Fall Precaution Comments: Secondary to patient restless Restrictions Weight Bearing Restrictions: No Mobility (including Balance) Bed Mobility Supine to Sit: 4: Min assist;With rails Supine to Sit Details (indicate cue type and reason): A to initiate movement. Cues for hand placement and LE positioning.  Sitting - Scoot to Edge of Bed: 4: Min assist Sitting - Scoot to Grant of Bed Details (indicate cue type and reason): A with pad. Cues for technique Transfers Transfers: Yes Sit to Stand: 1: +2 Total assist;Patient percentage (comment);From bed;From chair/3-in-1 (p=60%) Sit to Stand Details (indicate cue type and reason): Cues for technique. A to initiate stand and balance. A to shift pelvis anteriorly to gain upright posture. Stood x2 Stand to Sit: To chair/3-in-1;1: +2 Total assist;Patient percentage (comment) (p=40%) Stand to Sit Details: A to control descent and position hips into recliner. Pt giving out prior to sitting.  Squat Pivot Transfers: 1: +2 Total assist;Patient percentage (comment);Other (comment) (p=40%) Squat Pivot Transfer Details (indicate cue type and reason): A to position patient towards recliner. Attempted to have patient take some pivotal steps but only able to shift feet  slightly. Pt unable to stand fully upright with transfer    Exercise    End of Session PT - End of Session Equipment Utilized During Treatment: Gait belt Activity Tolerance: Patient limited by pain Patient left: in chair;with call bell in reach Nurse Communication: Mobility status for transfers General Behavior During Session: [facility] for tasks performed Cognition: Impaired (Pt having difficulty focusing on task. Easily distracted)  Cele Mote, Adline Potter 06/03/2011, 2:28 PM 06/03/2011 Fredrich Birks PTA [phone] pager [phone] office

## 2011-06-03 NOTE — Progress Notes (Signed)
Patient examined and I agree with the assessment and plan  [name] E  

## 2011-06-04 LAB — BASIC METABOLIC PANEL
CO2: 26 mEq/L (ref 19–32)
Chloride: 97 mEq/L (ref 96–112)
Glucose, Bld: 128 mg/dL — ABNORMAL HIGH (ref 70–99)
Sodium: 129 mEq/L — ABNORMAL LOW (ref 135–145)

## 2011-06-04 LAB — GLUCOSE, CAPILLARY
Glucose-Capillary: 127 mg/dL — ABNORMAL HIGH (ref 70–99)
Glucose-Capillary: 136 mg/dL — ABNORMAL HIGH (ref 70–99)
Glucose-Capillary: 143 mg/dL — ABNORMAL HIGH (ref 70–99)

## 2011-06-04 LAB — CBC
Hemoglobin: 10.2 g/dL — ABNORMAL LOW (ref 12.0–15.0)
Platelets: 366 10*3/uL (ref 150–400)
RBC: 3.78 MIL/uL — ABNORMAL LOW (ref 3.87–5.11)
WBC: 8.4 10*3/uL (ref 4.0–10.5)

## 2011-06-04 MED ORDER — INSULIN REGULAR HUMAN 100 UNIT/ML IJ SOLN
INTRAVENOUS | Status: AC
  Administered 2011-06-04: 18:00:00 via INTRAVENOUS
  Filled 2011-06-04: qty 2000

## 2011-06-04 MED ORDER — ALTEPLASE 2 MG IJ SOLR
2.0000 mg | Freq: Once | INTRAMUSCULAR | Status: AC
  Administered 2011-06-04: 2 mg
  Filled 2011-06-04: qty 2

## 2011-06-04 NOTE — Progress Notes (Signed)
Patient examined and I agree with the assessment and plan  Keyari Kleeman E  

## 2011-06-04 NOTE — Progress Notes (Signed)
CSW received call from MD that she was not comfortable with patient dc to SNF with problems and preferred LTAC. CSW called SNF and explained that patient would not be dc to their facility due to medical concerns. CSW called Dr. Jacky Kindle and left a message stating that MD was not comfortable with patient dc to SNF and to ask for assistance with LTAC placement. CSW called CM and updated her on plan as well. CSW will continue to follow to assist with dc needs. [place], [place] [phone]

## 2011-06-04 NOTE — Progress Notes (Signed)
ANTIBIOTIC CONSULT NOTE - FOLLOW UP  Pharmacy Consult for Primaxin Indication: UTI/IA abscess  Allergies  Allergen Reactions  . Other Swelling    Pinto beans cause swelling and hives  . Penicillins Swelling    Patient Measurements: Height: 5\' 6"  (167.6 cm) Weight: 170 lb 3.1 oz (77.2 kg) IBW/kg (Calculated) : 59.3    Vital Signs: Temp: 99 F (37.2 C) (12/06 0616) BP: 146/78 mmHg (12/06 0616) Pulse Rate: 99  (12/06 0616) Intake/Output from previous day: 12/05 0701 - 12/06 0700 In: 12699.3 [IV Piggyback:300; ZOX:09604.5] Out: 2558 [Urine:2500; Drains:58] Intake/Output from this shift:    Labs:  [facility] 06/04/11 0600 06/03/11 0615 06/02/11 0610  WBC 8.4 9.3 8.1  HGB 10.2* 10.3* 10.0*  PLT 366 371 392  LABCREA -- -- --  CREATININE 0.45* 0.41* 0.46*   Estimated Creatinine Clearance: 61.8 ml/min (by C-G formula based on Cr of 0.45). No results found for this basename: VANCOTROUGH:2,VANCOPEAK:2,VANCORANDOM:2,GENTTROUGH:2,GENTPEAK:2,GENTRANDOM:2,TOBRATROUGH:2,TOBRAPEAK:2,TOBRARND:2,AMIKACINPEAK:2,AMIKACINTROU:2,AMIKACIN:2, in the last 72 hours   Microbiology: Recent Results (from the past 720 hour(s))  SURGICAL PCR SCREEN     Status: Abnormal   Collection Time   05/07/11  9:30 AM      Component Value Range Status Comment   MRSA, PCR NEGATIVE  NEGATIVE  Final    Staphylococcus aureus POSITIVE (*) NEGATIVE  Final   URINE CULTURE     Status: Normal   Collection Time   05/07/11 10:20 AM      Component Value Range Status Comment   Specimen Description URINE, RANDOM   Final    Special Requests NONE   Final    Setup Time [id number]   Final    Colony Count NO GROWTH   Final    Culture NO GROWTH   Final    Report Status 05/08/2011 FINAL   Final   CULTURE, BLOOD (ROUTINE X 2)     Status: Normal   Collection Time   05/07/11 11:20 AM      Component Value Range Status Comment   Specimen Description BLOOD   Final    Special Requests     Final    Value: BOTTLES DRAWN  AEROBIC AND ANAEROBIC 5CC RT SUBCLAVIAN VEIN   Setup Time [id number]   Final    Culture NO GROWTH 5 DAYS   Final    Report Status 05/13/2011 FINAL   Final   CULTURE, BLOOD (ROUTINE X 2)     Status: Normal   Collection Time   05/07/11 11:20 AM      Component Value Range Status Comment   Specimen Description BLOOD   Final    Special Requests     Final    Value: BOTTLES DRAWN AEROBIC AND ANAEROBIC 5CC RT SUBCLAVIAN VEIN   Setup Time [id number]   Final    Culture NO GROWTH 5 DAYS   Final    Report Status 05/13/2011 FINAL   Final   CULTURE, RESPIRATORY     Status: Normal   Collection Time   05/13/11  6:02 AM      Component Value Range Status Comment   Specimen Description ENDOTRACHEAL   Final    Special Requests NONE   Final    Gram Stain     Final    Value: FEW WBC PRESENT, PREDOMINANTLY PMN     RARE SQUAMOUS EPITHELIAL CELLS PRESENT     NO ORGANISMS SEEN   Culture NO GROWTH 2 DAYS   Final    Report Status 05/15/2011 FINAL   Final   URINE  CULTURE     Status: Normal   Collection Time   05/30/11  5:34 AM      Component Value Range Status Comment   Specimen Description URINE, CATHETERIZED   Final    Special Requests NONE   Final    Setup Time [id number]   Final    Colony Count >=100,000 COLONIES/ML   Final    Culture     Final    Value: PSEUDOMONAS AERUGINOSA     ESCHERICHIA COLI   Report Status 06/02/2011 FINAL   Final    Organism ID, Bacteria PSEUDOMONAS AERUGINOSA   Final    Organism ID, Bacteria ESCHERICHIA COLI   Final   CULTURE, ROUTINE-ABSCESS     Status: Normal   Collection Time   05/31/11  1:29 PM      Component Value Range Status Comment   Specimen Description ABSCESS ABDOMEN RUQ   Final    Special Requests PATIENT ON FOLLOWING VANC, CIRPO, PRIMAXIN   Final    Gram Stain     Final    Value: NO WBC SEEN     NO SQUAMOUS EPITHELIAL CELLS SEEN     NO ORGANISMS SEEN   Culture NO GROWTH 3 DAYS   Final    Report Status 06/03/2011 FINAL   Final     Anti-infectives      Start     Dose/Rate Route Frequency Ordered Stop   06/03/11 2115   ciprofloxacin (CIPRO) IVPB 400 mg  Status:  Discontinued     Comments: Please mix with NS.      400 mg 200 mL/hr over 60 Minutes Intravenous Every 12 hours 06/03/11 2114 06/03/11 2121   05/30/11 1130   ciprofloxacin (CIPRO) IVPB 400 mg  Status:  Discontinued        400 mg 200 mL/hr over 60 Minutes Intravenous 2 times daily 05/30/11 1038 06/02/11 1618   05/29/11 2100   imipenem-cilastatin (PRIMAXIN) 500 mg in sodium chloride 0.9 % 100 mL IVPB        500 mg 200 mL/hr over 30 Minutes Intravenous Every 8 hours 05/29/11 2007     05/29/11 2100   vancomycin (VANCOCIN) 1,250 mg in sodium chloride 0.9 % 250 mL IVPB  Status:  Discontinued        1,250 mg 166.7 mL/hr over 90 Minutes Intravenous Every 24 hours 05/29/11 2007 06/03/11 1006   05/07/11 1400   imipenem-cilastatin (PRIMAXIN) 500 mg in sodium chloride 0.9 % 100 mL IVPB        500 mg 200 mL/hr over 30 Minutes Intravenous 3 times per day 05/07/11 1106 05/21/11 1359   05/07/11 1200   micafungin (MYCAMINE) 100 mg in sodium chloride 0.9 % 100 mL IVPB  Status:  Discontinued     Comments: PER PHARMACY      100 mg 100 mL/hr over 1 Hours Intravenous Every 24 hours 05/07/11 1032 05/19/11 1103   05/07/11 1200   vancomycin (VANCOCIN) 1,250 mg in sodium chloride 0.9 % 250 mL IVPB  Status:  Discontinued        1,250 mg 166.7 mL/hr over 90 Minutes Intravenous Every 24 hours 05/07/11 1106 05/19/11 1103   05/07/11 1000   micafungin (MYCAMINE) 150 mg in sodium chloride 0.9 % 100 mL IVPB  Status:  Discontinued     Comments: PER PHARMACY      150 mg 100 mL/hr over 1 Hours Intravenous Daily 05/07/11 0951 05/07/11 1029   05/07/11 0300   ertapenem (INVANZ) 1 g in  sodium chloride 0.9 % 50 mL IVPB        1 g 100 mL/hr over 30 Minutes Intravenous  Once 05/07/11 0300 05/07/11 0348   05/07/11 0130   cefTRIAXone (ROCEPHIN) 1 g in dextrose 5 % 50 mL IVPB        1 g 100 mL/hr over 30  Minutes Intravenous  Once 05/07/11 0122 05/07/11 0200          Assessment: 77 y/o female patient receiving day#6 primaxin for inta-abdominal abscess complicated by UTI. Urine cx grew pseudomonas and ecoli, both are sensitive to primaxin. Renal fxn stable. abx dosed appropriately.   Plan:  Continue primaxin 500mg  iv q8h, will follow.   Verlene Mayer, PharmD, BCPS Pager [phone]  06/04/2011,2:02 PM

## 2011-06-04 NOTE — Progress Notes (Signed)
Noticed drainage on pt's gown and upon closer examination realized that JP number 2 was all the way out. MD Tseui with surgery team notified and orders received to cover insertion site with 4    By 4 gauze dressing. Will continue to monitor

## 2011-06-04 NOTE — Progress Notes (Addendum)
Patient ID: [name], female   DOB: [birth year], 77 y.o.   MRN: [id number]    IR Abdominal drain placed 12/2  O:  JP drain intact Site clean and dry; sl tender Output 20cc 12/5 20 cc in JP now. Output milky yellow cx neg Wbc 8.4  Other drains intact; being followed by surgery  A/P:  Will follow drain for now Will need Re CT when output under 10cc/24hrs

## 2011-06-04 NOTE — Progress Notes (Signed)
28 Days Post-Op  Subjective: Pt without c/o Pulled JP#2 out last pm, dressing placed over. Fortunately the original 2 JP drains were not doing much since the perc drain was placed. That drain remains.  Objective: Vital signs in last 24 hours: Temp:  [98.8 F (37.1 C)-99.4 F (37.4 C)] 99 F (37.2 C) (12/06 0616) Pulse Rate:  [99-109] 99  (12/06 0616) Resp:  [16-20] 16  (12/06 0616) BP: (107-147)/(71-78) 146/78 mmHg (12/06 0616) SpO2:  [95 %-98 %] 98 % (12/06 0616) Last BM Date: 06/02/11  Intake/Output this shift:    Physical Exam: BP 146/78  Pulse 99  Temp(Src) 99 F (37.2 C) (Axillary)  Resp 16  Ht 5\' 6"  (1.676 m)  Wt 170 lb 3.1 oz (77.2 kg)  BMI 27.47 kg/m2  SpO2 98% Wound clean, granulating well. G-tube to gravity J-tube site(where ostomy pouch is)scant output, may be slowly closing up. Remaining JP with scant serous output, may be able to remove this as well Perc Drain: still with milky purulent output  Labs: CBC  Basename 06/04/11 0600 06/03/11 0615  WBC 8.4 9.3  HGB 10.2* 10.3*  HCT 31.7* 30.9*  PLT 366 371   BMET  Basename 06/04/11 0600 06/03/11 0615  NA 129* 129*  K 3.5 3.2*  CL 97 97  CO2 26 25  GLUCOSE 128* 146*  BUN 12 12  CREATININE 0.45* 0.41*  CALCIUM 8.5 8.5   LFT No results found for this basename: PROT,ALBUMIN,AST,ALT,ALKPHOS,BILITOT,BILIDIR,IBILI,LIPASE in the last 72 hours PT/INR No results found for this basename: LABPROT:2,INR:2 in the last 72 hours ABG No results found for this basename: PHART:2,PCO2:2,PO2:2,HCO3:2 in the last 72 hours  Studies/Results: No results found.  Assessment: Principal Problem:  *Abdominal pain, acute Active Problems:  DIABETES MELLITUS, TYPE II  HYPERTENSION  Anemia  Hyponatremia  Metabolic acidosis  Esophagitis, erosive  Acute respiratory failure   Procedure(s): EXPLORATORY LAPAROTOMY GASTROSTOMY GASTRECTOMY CHOLECYSTECTOMY  Plan: Would continue abx as purulence still coming from  drains. No harm done in drain coming out last pm. Needs repeat CT early next week to reassess fluid collections. Cont bowel rest, TPN  LOS: 29 days    Marianna Fuss 06/04/2011

## 2011-06-04 NOTE — Progress Notes (Signed)
PARENTERAL NUTRITION CONSULT NOTE - FOLLOW UP Pharmacy Consult for TPN  Indication: Bowel rest/NPO, Not tolerating TF. s/p gastrectomy, gastrostomy, cholecysectomy for erosive esophagitis and perforated ulcer. Intraabdominal abscess.   Allergies  Allergen Reactions  . Other Swelling    Pinto beans cause swelling and hives  . Penicillins Swelling    Patient Measurements: Height: 5\' 6"  (167.6 cm) Weight: 170 lb 3.1 oz (77.2 kg) IBW/kg (Calculated) : 59.3   Vital Signs: Temp: 99 F (37.2 C) (12/06 0616) BP: 146/78 mmHg (12/06 0616) Pulse Rate: 99  (12/06 0616) Intake/Output from previous day: 12/05 0701 - 12/06 0700 In: 12699.3 [IV Piggyback:300; ZOX:09604.5] Out: 2558 [Urine:2500; Drains:58] Intake/Output from this shift:    Labs:  [facility] 06/04/11 0600 06/03/11 0615 06/02/11 0610  WBC 8.4 9.3 8.1  HGB 10.2* 10.3* 10.0*  HCT 31.7* 30.9* 30.3*  PLT 366 371 392  APTT -- -- --  INR -- -- --     Basename 06/04/11 0600 06/03/11 0615 06/02/11 0610  NA 129* 129* 131*  K 3.5 3.2* 3.6  CL 97 97 98  CO2 26 25 24   GLUCOSE 128* 146* 105*  BUN 12 12 11   CREATININE 0.45* 0.41* 0.46*  LABCREA -- -- --  CREAT24HRUR -- -- --  CALCIUM 8.5 8.5 8.2*  MG -- -- --  PHOS -- -- --  PROT -- -- --  ALBUMIN -- -- --  AST -- -- --  ALT -- -- --  ALKPHOS -- -- --  BILITOT -- -- --  BILIDIR -- -- --  IBILI -- -- --  PREALBUMIN -- -- --  CHOLHDL -- -- --  CHOL -- -- --   Estimated Creatinine Clearance: 61.8 ml/min (by C-G formula based on Cr of 0.45).    Basename 06/04/11 0627 06/04/11 0028 06/03/11 1642  GLUCAP 127* 136* 132*    Medications:  Scheduled:     . heparin subcutaneous  5,000 Units Subcutaneous Q8H  . imipenem-cilastatin  500 mg Intravenous Q8H  . insulin aspart  0-9 Units Subcutaneous Q6H  . lip balm  1 application Topical BID  . metoprolol  5 mg Intravenous Q12H  . pantoprazole (PROTONIX) IV  40 mg Intravenous Q12H  . potassium chloride  10 mEq  Intravenous Q1 Hr x 3  . DISCONTD: ciprofloxacin  400 mg Intravenous Q12H   Infusions:     . TPN (CLINIMIX) +/- additives 80 mL/hr at 06/04/11 0500   And  . fat emulsion 250 mL (06/04/11 0520)  . TPN (CLINIMIX) +/- additives 80 mL/hr at 06/02/11 1808    Insulin Requirements in the past 24 hours:  4 units novolog SSI and 50 units regular insulin in TPN  Assessment:  77 y/o previously on TPN (for erosive esophagitis with perforated pyloric ulcer s/p lap. 11/7 with gastrostomy, gastrectomy and cholecystectomy) TPN initially started 05/12/11 - 05/14/2011. 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 TPN changed to continuous 24h infusion d/t low Na.    1. Endo - CBGs within goal.  2. Lytes - K=3.5  Na 129 3. Renal fxn stable,   Nutritional Goals:  1785-2000 kcal and 95-110 g protein   Plan:  1. No change today.  2. Due to national backorder, lipids, MVI, and trace elements will be given MWF only.  3. F/U AM labs    Anylah Scheib [place]et 06/04/2011,10:33 AM

## 2011-06-04 NOTE — Progress Notes (Signed)
Subjective: Lying in bed in not acute distress. Denies worsening abdominal pain.   Objective: Filed Vitals:   06/03/11 2122 06/03/11 2300 06/04/11 0616 06/04/11 1300  BP: 147/74  146/78 138/80  Pulse: 109 103 99 88  Temp:  98.8 F (37.1 C) 99 F (37.2 C) 97.6 F (36.4 C)  TempSrc:      Resp: 20  16 21   Height:      Weight:      SpO2:  98% 98% 95%   Weight change:   Intake/Output Summary (Last 24 hours) at 06/04/11 1719 Last data filed at 06/04/11 1300  Gross per 24 hour  Intake 10906.67 ml  Output   2108 ml  Net 8798.67 ml    General: Alert, awake, oriented x3, in no acute distress.  HEENT: No bruits, no goiter.  Heart: Regular rate and rhythm, without murmurs, rubs, gallops.  Lungs: Crackles left side, bilateral air movement.  Abdomen: Soft, nontender, nondistended, positive bowel sounds.  Neuro: Grossly intact, nonfocal.   Lab Results:  Basename 06/04/11 0600 06/03/11 0615  NA 129* 129*  K 3.5 3.2*  CL 97 97  CO2 26 25  GLUCOSE 128* 146*  BUN 12 12  CREATININE 0.45* 0.41*  CALCIUM 8.5 8.5  MG -- --  PHOS -- --   Basename 06/04/11 0600 06/03/11 0615  WBC 8.4 9.3  NEUTROABS -- --  HGB 10.2* 10.3*  HCT 31.7* 30.9*  MCV 83.9 83.3  PLT 366 371    Micro Results: Recent Results (from the past 240 hour(s))  URINE CULTURE     Status: Normal   Collection Time   05/30/11  5:34 AM      Component Value Range Status Comment   Specimen Description URINE, CATHETERIZED   Final    Special Requests NONE   Final    Setup Time [id number]   Final    Colony Count >=100,000 COLONIES/ML   Final    Culture     Final    Value: PSEUDOMONAS AERUGINOSA     ESCHERICHIA COLI   Report Status 06/02/2011 FINAL   Final    Organism ID, Bacteria PSEUDOMONAS AERUGINOSA   Final    Organism ID, Bacteria ESCHERICHIA COLI   Final   CULTURE, ROUTINE-ABSCESS     Status: Normal   Collection Time   05/31/11  1:29 PM      Component Value Range Status Comment   Specimen Description  ABSCESS ABDOMEN RUQ   Final    Special Requests PATIENT ON FOLLOWING VANC, CIRPO, PRIMAXIN   Final    Gram Stain     Final    Value: NO WBC SEEN     NO SQUAMOUS EPITHELIAL CELLS SEEN     NO ORGANISMS SEEN   Culture NO GROWTH 3 DAYS   Final    Report Status 06/03/2011 FINAL   Final     Studies/Results: No results found.  Medications: I have reviewed the patient's current medications.  [name] is a 77 y.o. female transferred from [facility] on 05/06/2011 with headache and abd pain. Found to have intraperitoneal free air, and perforated pylorus complicated by septic shock and VDRF.   Patient Active Hospital Problem List:   1-Abdominal pain, acute (05/07/2011) - Secondary to recent bowel perforation and s/p surgery : Exp. Lap, lysis of adhesions, antrectomy,proximal and duodenal resection, bilroth II, gastrojejunostomy anastomosis, Omental patch of duodenal bulb, gastrostomy, jejunostomy. 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 Will ask Surgery course of antibiotics.?? Imipenem stared Nov 8 and stopped nov 22. Then restarted Nov 30.   IR Recommend f/u CT early next week unless clinically worsens.  Culture Abscess no growth.  2-Sepsis (05/07/2011), Erosive esophagitis and acute abdomen. Assessment: Due to a perforated pyloric ulcer s/p laparotomy 11/07.  Sepsis resolved.   3-DIABETES MELLITUS, TYPE II (09/13/2007) SSI.  4-HYPERTENSION (09/13/2007) Hydralazine PRN.  Anemia (01/26/2011) Stable.  Hyponatremia (01/28/2011) Stable. Monitor. Patient getting d15 with parenteral nutrition.   Acute respiratory failure (05/12/2011) Resolved.  ETT 11/8>>> 11/18. Secondary to VDRF.  UTI : urine with E coli and cipro. Ua 12-01. Suspect imipenem will cover both.            Hypokalemia: Replete. DVT Prophylaxis:  Heparin.  Disposition: Patient will likely benefit from long term facility.   LOS: 29  days   [name] M.D.  [facility] 06/04/2011, 5:19 PM

## 2011-06-04 NOTE — Progress Notes (Signed)
This patient was discussed at long LOS rounds 12.05.12  

## 2011-06-05 LAB — CBC
MCHC: 32.4 g/dL (ref 30.0–36.0)
Platelets: 365 10*3/uL (ref 150–400)
RDW: 18.5 % — ABNORMAL HIGH (ref 11.5–15.5)
WBC: 7.4 10*3/uL (ref 4.0–10.5)

## 2011-06-05 LAB — COMPREHENSIVE METABOLIC PANEL
BUN: 13 mg/dL (ref 6–23)
CO2: 25 mEq/L (ref 19–32)
Calcium: 8.4 mg/dL (ref 8.4–10.5)
Creatinine, Ser: 0.41 mg/dL — ABNORMAL LOW (ref 0.50–1.10)
GFR calc Af Amer: >90 mL/min (ref >90)
GFR calc non Af Amer: >90 mL/min (ref >90)
Glucose, Bld: 125 mg/dL — ABNORMAL HIGH (ref 70–99)

## 2011-06-05 LAB — GLUCOSE, CAPILLARY
Glucose-Capillary: 125 mg/dL — ABNORMAL HIGH (ref 70–99)
Glucose-Capillary: 146 mg/dL — ABNORMAL HIGH (ref 70–99)
Glucose-Capillary: 147 mg/dL — ABNORMAL HIGH (ref 70–99)
Glucose-Capillary: 147 mg/dL — ABNORMAL HIGH (ref 70–99)

## 2011-06-05 LAB — MAGNESIUM: Magnesium: 1.4 mg/dL — ABNORMAL LOW (ref 1.5–2.5)

## 2011-06-05 LAB — PHOSPHORUS: Phosphorus: 3.9 mg/dL (ref 2.3–4.6)

## 2011-06-05 MED ORDER — FAT EMULSION 20 % IV EMUL
250.0000 mL | INTRAVENOUS | Status: AC
  Administered 2011-06-05: 250 mL via INTRAVENOUS
  Filled 2011-06-05: qty 250

## 2011-06-05 MED ORDER — MAGNESIUM SULFATE 40 MG/ML IJ SOLN
2.0000 g | Freq: Once | INTRAMUSCULAR | Status: AC
  Administered 2011-06-05: 2 g via INTRAVENOUS
  Filled 2011-06-05: qty 50

## 2011-06-05 MED ORDER — TRACE MINERALS CR-CU-MN-SE-ZN 10-1000-500-60 MCG/ML IV SOLN
INTRAVENOUS | Status: DC
  Administered 2011-06-05: 18:00:00 via INTRAVENOUS
  Filled 2011-06-05: qty 2000

## 2011-06-05 NOTE — Progress Notes (Addendum)
29 Days Post-Op  Subjective: Epigastric abscess post partial gastrectomy. Absc drain placed 12/2 in IR Pt still lethargic but communicates. Says "she doesn't feel well"  Objective: Vital signs in last 24 hours: Temp:  [97.6 F (36.4 C)-98.7 F (37.1 C)] 98.7 F (37.1 C) (12/07 0518) Pulse Rate:  [88-101] 98  (12/07 0518) Resp:  [18-21] 18  (12/07 0518) BP: (138-147)/(80-82) 147/82 mmHg (12/07 0518) SpO2:  [95 %-99 %] 97 % (12/07 0518) Last BM Date: 06/02/11  Intake/Output from previous day: 12/06 0701 - 12/07 0700 In: 0  Out: 505 [Urine:500; Drains:5] Intake/Output this shift:    PE:  Afeb; Output 40 cc in JP now and purulent milky yellow cx neg Wbc 7.4 Site clean and dry/NT/no sign of infection  Lab Results:   Basename 06/05/11 0710 06/04/11 0600  WBC 7.4 8.4  HGB 10.3* 10.2*  HCT 31.8* 31.7*  PLT 365 366   BMET  Basename 06/05/11 0710 06/04/11 0600  NA 130* 129*  K 3.6 3.5  CL 96 97  CO2 25 26  GLUCOSE 125* 128*  BUN 13 12  CREATININE 0.41* 0.45*  CALCIUM 8.4 8.5   PT/INR No results found for this basename: LABPROT:2,INR:2 in the last 72 hours ABG No results found for this basename: PHART:2,PCO2:2,PO2:2,HCO3:2 in the last 72 hours  Studies/Results: No results found.  Anti-infectives: Anti-infectives     Start     Dose/Rate Route Frequency Ordered Stop   06/03/11 2115   ciprofloxacin (CIPRO) IVPB 400 mg  Status:  Discontinued     Comments: Please mix with NS.      400 mg 200 mL/hr over 60 Minutes Intravenous Every 12 hours 06/03/11 2114 06/03/11 2121   05/30/11 1130   ciprofloxacin (CIPRO) IVPB 400 mg  Status:  Discontinued        400 mg 200 mL/hr over 60 Minutes Intravenous 2 times daily 05/30/11 1038 06/02/11 1618   05/29/11 2100   imipenem-cilastatin (PRIMAXIN) 500 mg in sodium chloride 0.9 % 100 mL IVPB        500 mg 200 mL/hr over 30 Minutes Intravenous Every 8 hours 05/29/11 2007     05/29/11 2100   vancomycin (VANCOCIN) 1,250 mg in  sodium chloride 0.9 % 250 mL IVPB  Status:  Discontinued        1,250 mg 166.7 mL/hr over 90 Minutes Intravenous Every 24 hours 05/29/11 2007 06/03/11 1006   05/07/11 1400   imipenem-cilastatin (PRIMAXIN) 500 mg in sodium chloride 0.9 % 100 mL IVPB        500 mg 200 mL/hr over 30 Minutes Intravenous 3 times per day 05/07/11 1106 05/21/11 1359   05/07/11 1200   micafungin (MYCAMINE) 100 mg in sodium chloride 0.9 % 100 mL IVPB  Status:  Discontinued     Comments: PER PHARMACY      100 mg 100 mL/hr over 1 Hours Intravenous Every 24 hours 05/07/11 1032 05/19/11 1103   05/07/11 1200   vancomycin (VANCOCIN) 1,250 mg in sodium chloride 0.9 % 250 mL IVPB  Status:  Discontinued        1,250 mg 166.7 mL/hr over 90 Minutes Intravenous Every 24 hours 05/07/11 1106 05/19/11 1103   05/07/11 1000   micafungin (MYCAMINE) 150 mg in sodium chloride 0.9 % 100 mL IVPB  Status:  Discontinued     Comments: PER PHARMACY      150 mg 100 mL/hr over 1 Hours Intravenous Daily 05/07/11 0951 05/07/11 1029   05/07/11 0300  ertapenem (INVANZ) 1 g in sodium chloride 0.9 % 50 mL IVPB        1 g 100 mL/hr over 30 Minutes Intravenous  Once 05/07/11 0300 05/07/11 0348   05/07/11 0130   cefTRIAXone (ROCEPHIN) 1 g in dextrose 5 % 50 mL IVPB        1 g 100 mL/hr over 30 Minutes Intravenous  Once 05/07/11 0122 05/07/11 0200          Assessment/Plan: s/p Procedure(s): EXPLORATORY LAPAROTOMY GASTROSTOMY GASTRECTOMY CHOLECYSTECTOMY  Will continue to monitor output of JP drain placed by IR 12/2 (mid epigastric) Will need re CT when output diminishes. Plan per surgery.   [name] A 06/05/2011

## 2011-06-05 NOTE — Progress Notes (Signed)
Subjective: Relates some abdominal pan, not worse.  Objective: Filed Vitals:   06/04/11 1300 06/04/11 2232 06/05/11 0518 06/05/11 1300  BP: 138/80 142/81 147/82 136/76  Pulse: 88 101 98 88  Temp: 97.6 F (36.4 C) 98.7 F (37.1 C) 98.7 F (37.1 C) 98.1 F (36.7 C)  TempSrc:  Oral Oral   Resp: 21 18 18 20   Height:      Weight:      SpO2: 95% 99% 97% 96%   Weight change:   Intake/Output Summary (Last 24 hours) at 06/05/11 1800 Last data filed at 06/05/11 0522  Gross per 24 hour  Intake      0 ml  Output    500 ml  Net   -500 ml    General: Alert, awake, , in no acute distress.  HEENT: No bruits, no goiter.  Heart: Regular rate and rhythm, without murmurs, rubs, gallops.  Lungs: CTA, bilateral air movement.  Abdomen: Soft, nontender, nondistended, positive bowel sounds.  Neuro: Grossly intact, nonfocal. Extremities; No edema.   Lab Results:  Basename 06/05/11 0710 06/04/11 0600  NA 130* 129*  K 3.6 3.5  CL 96 97  CO2 25 26  GLUCOSE 125* 128*  BUN 13 12  CREATININE 0.41* 0.45*  CALCIUM 8.4 8.5  MG 1.4* --  PHOS 3.9 --    Basename 06/05/11 0710  AST 13  ALT 8  ALKPHOS 96  BILITOT 0.2*  PROT 6.5  ALBUMIN 1.8*    Basename 06/05/11 0710 06/04/11 0600  WBC 7.4 8.4  NEUTROABS -- --  HGB 10.3* 10.2*  HCT 31.8* 31.7*  MCV 83.9 83.9  PLT 365 366   Micro Results: Recent Results (from the past 240 hour(s))  URINE CULTURE     Status: Normal   Collection Time   05/30/11  5:34 AM      Component Value Range Status Comment   Specimen Description URINE, CATHETERIZED   Final    Special Requests NONE   Final    Setup Time [id number]   Final    Colony Count >=100,000 COLONIES/ML   Final    Culture     Final    Value: PSEUDOMONAS AERUGINOSA     ESCHERICHIA COLI   Report Status 06/02/2011 FINAL   Final    Organism ID, Bacteria PSEUDOMONAS AERUGINOSA   Final    Organism ID, Bacteria ESCHERICHIA COLI   Final   CULTURE, ROUTINE-ABSCESS     Status: Normal   Collection Time   05/31/11  1:29 PM      Component Value Range Status Comment   Specimen Description ABSCESS ABDOMEN RUQ   Final    Special Requests PATIENT ON FOLLOWING VANC, CIRPO, PRIMAXIN   Final    Gram Stain     Final    Value: NO WBC SEEN     NO SQUAMOUS EPITHELIAL CELLS SEEN     NO ORGANISMS SEEN   Culture NO GROWTH 3 DAYS   Final    Report Status 06/03/2011 FINAL   Final     Studies/Results: No results found.  Medications: I have reviewed the patient's current medications.  Patient Active Hospital Problem List:   1-Abdominal pain, acute (05/07/2011) - Secondary to recent bowel perforation and s/p surgery : Exp. Lap, lysis of adhesions, antrectomy,proximal and duodenal resection, bilroth II, gastrojejunostomy anastomosis, Omental patch of duodenal bulb, gastrostomy, jejunostomy. 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  Imipenem stared Nov 8 and stopped nov 22. Then restarted Nov 30.  IR Recommend f/u CT early next week unless clinically worsens.  Culture Abscess no growth.  WBC decreasing, afebrile.  2-Sepsis (05/07/2011), Erosive esophagitis and acute abdomen. Assessment: Due to a perforated pyloric ulcer s/p laparotomy 11/07.  Sepsis resolved.   3-DIABETES MELLITUS, TYPE II (09/13/2007) SSI.  4-HYPERTENSION (09/13/2007) Hydralazine PRN.  Anemia (01/26/2011) Stable.  Hyponatremia (01/28/2011) Stable. Monitor. Patient getting d15 with parenteral nutrition.   Acute respiratory failure (05/12/2011) Resolved.  ETT 11/8>>> 11/18. Secondary to VDRF.   UTI : urine with E coli and cipro. Ua 12-01. Suspect imipenem will cover both.  Hypokalemia: Replete.  DVT Prophylaxis: Heparin.  Disposition: Patient will likely benefit from long term facility.     LOS: 30 days   Tiara Maultsby M.D.  [facility] 06/05/2011, 6:00 PM

## 2011-06-05 NOTE — Progress Notes (Signed)
Pt Cancel Note  PT cancelled today due to patients refusal for OOB due to pain. RN made aware. Please page with questions. Thanks 06/05/2011 Fredrich Birks PTA [phone] pager [phone] office

## 2011-06-05 NOTE — Progress Notes (Signed)
CSW spoke with CM who stated [facility] was willing to accept patient but that they were not contracted with patient's specific insurance. CSW called Dr. Jacky Kindle and left a message regarding this problem. CSW will continue to follow. [place], [place] [phone]

## 2011-06-05 NOTE — Progress Notes (Signed)
PARENTERAL NUTRITION CONSULT NOTE - FOLLOW UP Pharmacy Consult for TPN  Indication: Bowel rest/NPO, Not tolerating TF. s/p gastrectomy, gastrostomy, cholecysectomy for erosive esophagitis and perforated ulcer. Intraabdominal abscess.     Labs:  [facility] 06/05/11 0710 06/04/11 0600 06/03/11 0615  WBC 7.4 8.4 9.3  HGB 10.3* 10.2* 10.3*  HCT 31.8* 31.7* 30.9*  PLT 365 366 371  APTT -- -- --  INR -- -- --     Basename 06/05/11 0710 06/04/11 0600 06/03/11 0615  NA 130* 129* 129*  K 3.6 3.5 3.2*  CL 96 97 97  CO2 25 26 25   GLUCOSE 125* 128* 146*  BUN 13 12 12   CREATININE 0.41* 0.45* 0.41*  LABCREA -- -- --  CREAT24HRUR -- -- --  CALCIUM 8.4 8.5 8.5  MG 1.4* -- --  PHOS 3.9 -- --  PROT 6.5 -- --  ALBUMIN 1.8* -- --  AST 13 -- --  ALT 8 -- --  ALKPHOS 96 -- --  BILITOT 0.2* -- --  BILIDIR -- -- --  IBILI -- -- --  PREALBUMIN -- -- --  CHOLHDL -- -- --  CHOL -- -- --   Estimated Creatinine Clearance: 61.8 ml/min (by C-G formula based on Cr of 0.41).    Basename 06/05/11 1148 06/05/11 0516 06/05/11 0045  GLUCAP 147* 147* 146*     Insulin Requirements in the past 24 hours:  3 units novolog SSI and 50 units regular insulin in TPN  Assessment:  77 y/o previously on TPN (for erosive esophagitis with perforated pyloric ulcer s/p lap. 11/7 with gastrostomy, gastrectomy and cholecystectomy) TPN initially started 05/12/11 - 05/14/2011. 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 TPN changed to continuous 24h infusion d/t low Na.    1. Endo - CBGs within goal.  2. Lytes - K=3.6  Na 130, mag 1.4 3. Renal fxn stable,   Nutritional Goals:  1785-2000 kcal and 95-110 g protein   Plan:  1. No change to formula. 2. Magnesium 2 gram bolus 3. Due to national backorder, lipids, MVI, and trace elements will be given MWF only.  4. F/U AM labs    Len Childs T 06/05/2011,12:59 PM

## 2011-06-05 NOTE — Progress Notes (Signed)
Patient ID: CHALEY CASTELLANOS, female   DOB: [birth year], 77 y.o.   MRN: [id number] 29 Days Post-Op  Subjective:  C/O feeling bad all over, but no real change from the last week.  Objective: Vital signs in last 24 hours: Temp:  [97.6 F (36.4 C)-98.7 F (37.1 C)] 98.7 F (37.1 C) (12/07 0518) Pulse Rate:  [88-101] 98  (12/07 0518) Resp:  [18-21] 18  (12/07 0518) BP: (138-147)/(80-82) 147/82 mmHg (12/07 0518) SpO2:  [95 %-99 %] 97 % (12/07 0518) Last BM Date: 06/02/11  Intake/Output this shift:    Physical Exam: BP 147/82  Pulse 98  Temp(Src) 98.7 F (37.1 C) (Oral)  Resp 18  Ht 5\' 6"  (1.676 m)  Wt 77.2 kg (170 lb 3.1 oz)  BMI 27.47 kg/m2  SpO2 97% Wound clean, granulating well. G-tube to gravity J-tube site(where ostomy pouch is)scant output, may be slowly closing up. Remaining JP with scant serous output, may be able to remove this as well Perc Drain: still with milky purulent output  Gastrostomy tube with green colored drainage. Labs: CBC  Basename 06/05/11 0710 06/04/11 0600  WBC 7.4 8.4  HGB 10.3* 10.2*  HCT 31.8* 31.7*  PLT 365 366   BMET  Basename 06/05/11 0710 06/04/11 0600  NA 130* 129*  K 3.6 3.5  CL 96 97  CO2 25 26  GLUCOSE 125* 128*  BUN 13 12  CREATININE 0.41* 0.45*  CALCIUM 8.4 8.5   LFT  Basename 06/05/11 0710  PROT 6.5  ALBUMIN 1.8*  AST 13  ALT 8  ALKPHOS 96  BILITOT 0.2*  BILIDIR --  IBILI --  LIPASE --   PT/INR No results found for this basename: LABPROT:2,INR:2 in the last 72 hours ABG No results found for this basename: PHART:2,PCO2:2,PO2:2,HCO3:2 in the last 72 hours  Studies/Results: No results found.  Assessment: Principal Problem:  *Abdominal pain, acute Active Problems:  DIABETES MELLITUS, TYPE II  HYPERTENSION  Anemia  Hyponatremia  Metabolic acidosis  Esophagitis, erosive  Acute respiratory failure   Procedure(s): EXPLORATORY LAPAROTOMY GASTROSTOMY GASTRECTOMY CHOLECYSTECTOMY  Plan: Continue  antibiotice, TNA, Bowel rest. LOS: 30 days     Hadassah Rana 06/05/2011

## 2011-06-06 LAB — BASIC METABOLIC PANEL
CO2: 24 mEq/L (ref 19–32)
Calcium: 8.8 mg/dL (ref 8.4–10.5)
Chloride: 95 mEq/L — ABNORMAL LOW (ref 96–112)
Creatinine, Ser: 0.4 mg/dL — ABNORMAL LOW (ref 0.50–1.10)
Glucose, Bld: 143 mg/dL — ABNORMAL HIGH (ref 70–99)
Sodium: 128 mEq/L — ABNORMAL LOW (ref 135–145)

## 2011-06-06 LAB — GLUCOSE, CAPILLARY
Glucose-Capillary: 136 mg/dL — ABNORMAL HIGH (ref 70–99)
Glucose-Capillary: 133 mg/dL — ABNORMAL HIGH (ref 70–99)

## 2011-06-06 MED ORDER — INSULIN REGULAR HUMAN 100 UNIT/ML IJ SOLN
INTRAVENOUS | Status: AC
  Administered 2011-06-06: via INTRAVENOUS

## 2011-06-06 MED ORDER — INSULIN REGULAR HUMAN 100 UNIT/ML IJ SOLN
INTRAVENOUS | Status: AC
  Filled 2011-06-06: qty 2000

## 2011-06-06 MED ORDER — SODIUM CHLORIDE 0.9 % IV SOLN
INTRAVENOUS | Status: DC
  Administered 2011-06-11 – 2011-06-15 (×4): via INTRAVENOUS
  Administered 2011-06-16: 1000 mL via INTRAVENOUS
  Administered 2011-06-17: 21:00:00 via INTRAVENOUS
  Administered 2011-06-18: 1000 mL via INTRAVENOUS
  Administered 2011-06-20 – 2011-06-23 (×3): via INTRAVENOUS

## 2011-06-06 NOTE — Progress Notes (Signed)
Subjective: Feeling ok, denies pain right now. Does not want to stand up.  Objective: Filed Vitals:   06/05/11 0518 06/05/11 1300 06/05/11 2109 06/06/11 0506  BP: 147/82 136/76 133/75 133/75  Pulse: 98 88 102 97  Temp: 98.7 F (37.1 C) 98.1 F (36.7 C) 98.9 F (37.2 C) 99 F (37.2 C)  TempSrc: Oral     Resp: 18 20 20 18   Height:      Weight:      SpO2: 97% 96% 96% 97%   Weight change:   Intake/Output Summary (Last 24 hours) at 06/06/11 1236 Last data filed at 06/06/11 0755  Gross per 24 hour  Intake 5008.16 ml  Output   2655 ml  Net 2353.16 ml    General: Alert, awake, oriented x3, in no acute distress.  HEENT: No bruits, no goiter.  Heart: Regular rate and rhythm, without murmurs, rubs, gallops.  Lungs: Crackles left side, bilateral air movement.  Abdomen: Soft, Mild tender, nondistended, positive bowel sounds. Dressing, multiple drainage. Ostomy.  Neuro: Grossly intact, nonfocal.   Lab Results:  [facility] 06/06/11 0858 06/05/11 0710  NA 128* 130*  K 3.7 3.6  CL 95* 96  CO2 24 25  GLUCOSE 143* 125*  BUN 14 13  CREATININE 0.40* 0.41*  CALCIUM 8.8 8.4  MG 1.5 1.4*  PHOS -- 3.9    Basename 06/05/11 0710  AST 13  ALT 8  ALKPHOS 96  BILITOT 0.2*  PROT 6.5  ALBUMIN 1.8*   Basename 06/05/11 0710 06/04/11 0600  WBC 7.4 8.4  NEUTROABS -- --  HGB 10.3* 10.2*  HCT 31.8* 31.7*  MCV 83.9 83.9  PLT 365 366    Micro Results: Recent Results (from the past 240 hour(s))  URINE CULTURE     Status: Normal   Collection Time   05/30/11  5:34 AM      Component Value Range Status Comment   Specimen Description URINE, CATHETERIZED   Final    Special Requests NONE   Final    Setup Time [id number]   Final    Colony Count >=100,000 COLONIES/ML   Final    Culture     Final    Value: PSEUDOMONAS AERUGINOSA     ESCHERICHIA COLI   Report Status 06/02/2011 FINAL   Final    Organism ID, Bacteria PSEUDOMONAS AERUGINOSA   Final    Organism ID, Bacteria ESCHERICHIA COLI    Final   CULTURE, ROUTINE-ABSCESS     Status: Normal   Collection Time   05/31/11  1:29 PM      Component Value Range Status Comment   Specimen Description ABSCESS ABDOMEN RUQ   Final    Special Requests PATIENT ON FOLLOWING VANC, CIRPO, PRIMAXIN   Final    Gram Stain     Final    Value: NO WBC SEEN     NO SQUAMOUS EPITHELIAL CELLS SEEN     NO ORGANISMS SEEN   Culture NO GROWTH 3 DAYS   Final    Report Status 06/03/2011 FINAL   Final     Studies/Results: No results found.  Medications: I have reviewed the patient's current medications.  1-Abdominal pain, acute (05/07/2011) - Secondary to recent bowel perforation and s/p surgery : Exp. Lap, lysis of adhesions, antrectomy,proximal and duodenal resection, bilroth II, gastrojejunostomy anastomosis, Omental patch of duodenal bulb, gastrostomy, jejunostomy. 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  Imipenem  started Nov 8 and stopped nov 22. Then restarted Nov 30. --- IR Recommend f/u CT early next week unless clinically worsens.  Culture Abscess no growth.  WBC decreasing, afebrile.  Total day of antibiotics to date: 23 Disposition per Surgery.   2-Sepsis (05/07/2011), Erosive esophagitis and acute abdomen. Assessment: Due to a perforated pyloric ulcer s/p laparotomy 11/07.  Sepsis resolved.  3-DIABETES MELLITUS, TYPE II (09/13/2007) SSI. 4-HYPERTENSION (09/13/2007) Hydralazine PRN.  5-Anemia (01/26/2011) Stable.  6-Hyponatremia (01/28/2011) Patient getting D15 with parenteral nutrition. Sodium decrease today. Will consider NS.   7-Acute respiratory failure (05/12/2011) Resolved.  ETT 11/8>>> 11/18. Secondary to VDRF.   8-UTI : urine with E coli and cipro. Ua 12-01. Suspect imipenem will cover both. Repeat UA at some point.  9-Hypokalemia: Repleted.  10-DVT Prophylaxis: Heparin.  Disposition: Patient will likely benefit from long  term facility    LOS: 31 days   Jalon Squier M.D.  [facility] 06/06/2011, 12:36 PM

## 2011-06-06 NOTE — Progress Notes (Signed)
30 Days Post-Op  Subjective: No complaints  Objective: Vital signs in last 24 hours: Temp:  [98.1 F (36.7 C)-99 F (37.2 C)] 99 F (37.2 C) (12/08 0506) Pulse Rate:  [88-102] 97  (12/08 0506) Resp:  [18-20] 18  (12/08 0506) BP: (133-136)/(75-76) 133/75 mmHg (12/08 0506) SpO2:  [96 %-97 %] 97 % (12/08 0506) Last BM Date: 06/06/11  Intake/Output from previous day: 12/07 0701 - 12/08 0700 In: 5008.2 [IV Piggyback:600; ZOX:0960.4] Out: 2055 [Urine:2000; Drains:55] Intake/Output this shift: Total I/O In: -  Out: 600 [Urine:600]  GI: soft and nontender. Midline wound is clean. G-tube is intact.  Lab Results:   Basename 06/05/11 0710 06/04/11 0600  WBC 7.4 8.4  HGB 10.3* 10.2*  HCT 31.8* 31.7*  PLT 365 366   BMET  Basename 06/06/11 0858 06/05/11 0710  NA 128* 130*  K 3.7 3.6  CL 95* 96  CO2 24 25  GLUCOSE 143* 125*  BUN 14 13  CREATININE 0.40* 0.41*  CALCIUM 8.8 8.4   PT/INR No results found for this basename: LABPROT:2,INR:2 in the last 72 hours ABG No results found for this basename: PHART:2,PCO2:2,PO2:2,HCO3:2 in the last 72 hours  Studies/Results: No results found.  Anti-infectives: Anti-infectives     Start     Dose/Rate Route Frequency Ordered Stop   06/03/11 2115   ciprofloxacin (CIPRO) IVPB 400 mg  Status:  Discontinued     Comments: Please mix with NS.      400 mg 200 mL/hr over 60 Minutes Intravenous Every 12 hours 06/03/11 2114 06/03/11 2121   05/30/11 1130   ciprofloxacin (CIPRO) IVPB 400 mg  Status:  Discontinued        400 mg 200 mL/hr over 60 Minutes Intravenous 2 times daily 05/30/11 1038 06/02/11 1618   05/29/11 2100   imipenem-cilastatin (PRIMAXIN) 500 mg in sodium chloride 0.9 % 100 mL IVPB        500 mg 200 mL/hr over 30 Minutes Intravenous Every 8 hours 05/29/11 2007     05/29/11 2100   vancomycin (VANCOCIN) 1,250 mg in sodium chloride 0.9 % 250 mL IVPB  Status:  Discontinued        1,250 mg 166.7 mL/hr over 90 Minutes  Intravenous Every 24 hours 05/29/11 2007 06/03/11 1006   05/07/11 1400   imipenem-cilastatin (PRIMAXIN) 500 mg in sodium chloride 0.9 % 100 mL IVPB        500 mg 200 mL/hr over 30 Minutes Intravenous 3 times per day 05/07/11 1106 05/21/11 1359   05/07/11 1200   micafungin (MYCAMINE) 100 mg in sodium chloride 0.9 % 100 mL IVPB  Status:  Discontinued     Comments: PER PHARMACY      100 mg 100 mL/hr over 1 Hours Intravenous Every 24 hours 05/07/11 1032 05/19/11 1103   05/07/11 1200   vancomycin (VANCOCIN) 1,250 mg in sodium chloride 0.9 % 250 mL IVPB  Status:  Discontinued        1,250 mg 166.7 mL/hr over 90 Minutes Intravenous Every 24 hours 05/07/11 1106 05/19/11 1103   05/07/11 1000   micafungin (MYCAMINE) 150 mg in sodium chloride 0.9 % 100 mL IVPB  Status:  Discontinued     Comments: PER PHARMACY      150 mg 100 mL/hr over 1 Hours Intravenous Daily 05/07/11 0951 05/07/11 1029   05/07/11 0300   ertapenem (INVANZ) 1 g in sodium chloride 0.9 % 50 mL IVPB        1 g 100 mL/hr over  30 Minutes Intravenous  Once 05/07/11 0300 05/07/11 0348   05/07/11 0130   cefTRIAXone (ROCEPHIN) 1 g in dextrose 5 % 50 mL IVPB        1 g 100 mL/hr over 30 Minutes Intravenous  Once 05/07/11 0122 05/07/11 0200          Assessment/Plan: s/p Procedure(s): EXPLORATORY LAPAROTOMY GASTROSTOMY GASTRECTOMY CHOLECYSTECTOMY continue TPN for nutritional support. She states that she is tolerating some clear liquids. No changes today.  LOS: 31 days    TOTH III,Rane Dumm S 06/06/2011

## 2011-06-07 LAB — BASIC METABOLIC PANEL
BUN: 12 mg/dL (ref 6–23)
Calcium: 9.1 mg/dL (ref 8.4–10.5)
Chloride: 98 mEq/L (ref 96–112)
Creatinine, Ser: 0.46 mg/dL — ABNORMAL LOW (ref 0.50–1.10)
GFR calc Af Amer: >90 mL/min (ref >90)
GFR calc non Af Amer: >90 mL/min (ref >90)

## 2011-06-07 LAB — CBC
MCHC: 32.9 g/dL (ref 30.0–36.0)
MCV: 84.3 fL (ref 78.0–100.0)
Platelets: 284 10*3/uL (ref 150–400)
RDW: 18.4 % — ABNORMAL HIGH (ref 11.5–15.5)
WBC: 7.6 10*3/uL (ref 4.0–10.5)

## 2011-06-07 LAB — GLUCOSE, CAPILLARY
Glucose-Capillary: 139 mg/dL — ABNORMAL HIGH (ref 70–99)
Glucose-Capillary: 162 mg/dL — ABNORMAL HIGH (ref 70–99)

## 2011-06-07 MED ORDER — ALTEPLASE 2 MG IJ SOLR
2.0000 mg | Freq: Once | INTRAMUSCULAR | Status: AC
  Administered 2011-06-07: 2 mg
  Filled 2011-06-07: qty 2

## 2011-06-07 MED ORDER — INSULIN REGULAR HUMAN 100 UNIT/ML IJ SOLN
INTRAVENOUS | Status: AC
  Administered 2011-06-07: 18:00:00 via INTRAVENOUS
  Filled 2011-06-07: qty 2000

## 2011-06-07 NOTE — Progress Notes (Signed)
ANTIBIOTIC CONSULT NOTE - FOLLOW UP  Pharmacy Consult for Primaxin Indication: UTI/IA abscess  Allergies  Allergen Reactions  . Other Swelling    Pinto beans cause swelling and hives  . Penicillins Swelling    Patient Measurements: Height: 5\' 6"  (167.6 cm) Weight: 170 lb 3.1 oz (77.2 kg) IBW/kg (Calculated) : 59.3    Vital Signs: Temp: 98.9 F (37.2 C) (12/09 0513) Temp src: Oral (12/09 0513) BP: 159/90 mmHg (12/09 0513) Pulse Rate: 97  (12/09 0513) Intake/Output from previous day: 12/08 0701 - 12/09 0700 In: 257.3 [I.V.:257.3] Out: 2100 [Urine:2100] Intake/Output from this shift:    Labs:  Basename 06/07/11 0500 06/06/11 0858 06/05/11 0710  WBC 7.6 -- 7.4  HGB 10.4* -- 10.3*  PLT 284 -- 365  LABCREA -- -- --  CREATININE 0.46* 0.40* 0.41*   Estimated Creatinine Clearance: 61.8 ml/min (by C-G formula based on Cr of 0.46). No results found for this basename: VANCOTROUGH:2,VANCOPEAK:2,VANCORANDOM:2,GENTTROUGH:2,GENTPEAK:2,GENTRANDOM:2,TOBRATROUGH:2,TOBRAPEAK:2,TOBRARND:2,AMIKACINPEAK:2,AMIKACINTROU:2,AMIKACIN:2, in the last 72 hours   Microbiology: Recent Results (from the past 720 hour(s))  CULTURE, RESPIRATORY     Status: Normal   Collection Time   05/13/11  6:02 AM      Component Value Range Status Comment   Specimen Description ENDOTRACHEAL   Final    Special Requests NONE   Final    Gram Stain     Final    Value: FEW WBC PRESENT, PREDOMINANTLY PMN     RARE SQUAMOUS EPITHELIAL CELLS PRESENT     NO ORGANISMS SEEN   Culture NO GROWTH 2 DAYS   Final    Report Status 05/15/2011 FINAL   Final   URINE CULTURE     Status: Normal   Collection Time   05/30/11  5:34 AM      Component Value Range Status Comment   Specimen Description URINE, CATHETERIZED   Final    Special Requests NONE   Final    Setup Time [id number]   Final    Colony Count >=100,000 COLONIES/ML   Final    Culture     Final    Value: PSEUDOMONAS AERUGINOSA     ESCHERICHIA COLI   Report  Status 06/02/2011 FINAL   Final    Organism ID, Bacteria PSEUDOMONAS AERUGINOSA   Final    Organism ID, Bacteria ESCHERICHIA COLI   Final   CULTURE, ROUTINE-ABSCESS     Status: Normal   Collection Time   05/31/11  1:29 PM      Component Value Range Status Comment   Specimen Description ABSCESS ABDOMEN RUQ   Final    Special Requests PATIENT ON FOLLOWING VANC, CIRPO, PRIMAXIN   Final    Gram Stain     Final    Value: NO WBC SEEN     NO SQUAMOUS EPITHELIAL CELLS SEEN     NO ORGANISMS SEEN   Culture NO GROWTH 3 DAYS   Final    Report Status 06/03/2011 FINAL   Final     Anti-infectives     Start     Dose/Rate Route Frequency Ordered Stop   06/03/11 2115   ciprofloxacin (CIPRO) IVPB 400 mg  Status:  Discontinued     Comments: Please mix with NS.      400 mg 200 mL/hr over 60 Minutes Intravenous Every 12 hours 06/03/11 2114 06/03/11 2121   05/30/11 1130   ciprofloxacin (CIPRO) IVPB 400 mg  Status:  Discontinued        400 mg 200 mL/hr over 60 Minutes Intravenous 2  times daily 05/30/11 1038 06/02/11 1618   05/29/11 2100   imipenem-cilastatin (PRIMAXIN) 500 mg in sodium chloride 0.9 % 100 mL IVPB        500 mg 200 mL/hr over 30 Minutes Intravenous Every 8 hours 05/29/11 2007     05/29/11 2100   vancomycin (VANCOCIN) 1,250 mg in sodium chloride 0.9 % 250 mL IVPB  Status:  Discontinued        1,250 mg 166.7 mL/hr over 90 Minutes Intravenous Every 24 hours 05/29/11 2007 06/03/11 1006   05/07/11 1400   imipenem-cilastatin (PRIMAXIN) 500 mg in sodium chloride 0.9 % 100 mL IVPB        500 mg 200 mL/hr over 30 Minutes Intravenous 3 times per day 05/07/11 1106 05/21/11 1359   05/07/11 1200   micafungin (MYCAMINE) 100 mg in sodium chloride 0.9 % 100 mL IVPB  Status:  Discontinued     Comments: PER PHARMACY      100 mg 100 mL/hr over 1 Hours Intravenous Every 24 hours 05/07/11 1032 05/19/11 1103   05/07/11 1200   vancomycin (VANCOCIN) 1,250 mg in sodium chloride 0.9 % 250 mL IVPB  Status:   Discontinued        1,250 mg 166.7 mL/hr over 90 Minutes Intravenous Every 24 hours 05/07/11 1106 05/19/11 1103   05/07/11 1000   micafungin (MYCAMINE) 150 mg in sodium chloride 0.9 % 100 mL IVPB  Status:  Discontinued     Comments: PER PHARMACY      150 mg 100 mL/hr over 1 Hours Intravenous Daily 05/07/11 0951 05/07/11 1029   05/07/11 0300   ertapenem (INVANZ) 1 g in sodium chloride 0.9 % 50 mL IVPB        1 g 100 mL/hr over 30 Minutes Intravenous  Once 05/07/11 0300 05/07/11 0348   05/07/11 0130   cefTRIAXone (ROCEPHIN) 1 g in dextrose 5 % 50 mL IVPB        1 g 100 mL/hr over 30 Minutes Intravenous  Once 05/07/11 0122 05/07/11 0200          Assessment: 77 y/o female patient receiving day#9 primaxin for inta-abdominal abscess complicated by UTI. Urine cx grew pseudomonas and ecoli, both are sensitive to primaxin. Renal fxn stable. Abx dosed appropriately.   Plan:  Continue primaxin 500mg  iv q8h, will follow.   [place] R. Chinonso Linker, PharmD Pager [phone]  06/07/2011,9:51 AM

## 2011-06-07 NOTE — Progress Notes (Signed)
PARENTERAL NUTRITION CONSULT NOTE - FOLLOW UP Pharmacy Consult for TPN  Indication: Bowel rest/NPO, Not tolerating TF. s/p gastrectomy, gastrostomy, cholecysectomy for erosive esophagitis and perforated ulcer. Intraabdominal abscess.     Labs:  [facility] 06/07/11 0500 06/05/11 0710  WBC 7.6 7.4  HGB 10.4* 10.3*  HCT 31.6* 31.8*  PLT 284 365  APTT -- --  INR -- --     Basename 06/07/11 0500 06/06/11 0858 06/05/11 0710  NA 131* 128* 130*  K 3.6 3.7 3.6  CL 98 95* 96  CO2 24 24 25   GLUCOSE 135* 143* 125*  BUN 12 14 13   CREATININE 0.46* 0.40* 0.41*  LABCREA -- -- --  CREAT24HRUR -- -- --  CALCIUM 9.1 8.8 8.4  MG -- 1.5 1.4*  PHOS -- -- 3.9  PROT -- -- 6.5  ALBUMIN -- -- 1.8*  AST -- -- 13  ALT -- -- 8  ALKPHOS -- -- 96  BILITOT -- -- 0.2*  BILIDIR -- -- --  IBILI -- -- --  PREALBUMIN -- -- --  CHOLHDL -- -- --  CHOL -- -- --   Estimated Creatinine Clearance: 61.8 ml/min (by C-G formula based on Cr of 0.46).    Basename 06/07/11 0026 06/06/11 2037 06/06/11 1614  GLUCAP 133* 126* 133*     Insulin Requirements in the past 24 hours:  2 units novolog SSI and 50 units regular insulin in TPN  Assessment:  77 y/o previously on TPN (for erosive esophagitis with perforated pyloric ulcer s/p lap. 11/7 with gastrostomy, gastrectomy and cholecystectomy) TPN initially started 05/12/11 - 05/14/2011. 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 TPN changed to continuous 24h infusion d/t low Na.    1. Endo - CBGs within goal. 2. Lytes - K=3.6  Na 131 3. Renal fxn stable,  4.  TPN dced by mistake yesterday so new bag not hung until ~2330  Nutritional Goals:  1785-2000 kcal and 95-110 g protein   Plan:  1. No change to formula. 2. Dc ssi 3. Due to national backorder, lipids, MVI, and trace elements will be given MWF only.  4. F/U AM labs    [name] 06/07/2011,9:45 AM

## 2011-06-07 NOTE — Progress Notes (Signed)
[name] ZOX:[id number],[id number] is a 77 y.o. female,  Outpatient Primary MD for the patient is [name], MD, MD  Chief Complaint  Patient presents with  . Headache        Subjective:   [name] today has, No headache, No chest pain, No abdominal pain - No Nausea, No new weakness tingling or numbness, No Cough - SOB.    Objective:   Filed Vitals:   06/06/11 1341 06/06/11 2137 06/07/11 0513 06/07/11 1416  BP: 140/76 128/68 159/90 170/90  Pulse: 93 92 97 98  Temp: 98.9 F (37.2 C) 99 F (37.2 C) 98.9 F (37.2 C) 99.3 F (37.4 C)  TempSrc: Oral Oral Oral Oral  Resp: 21 20 20 18   Height:      Weight:      SpO2: 95% 99% 97% 99%    Wt Readings from Last 3 Encounters:  05/30/11 77.2 kg (170 lb 3.1 oz)  05/30/11 77.2 kg (170 lb 3.1 oz)  05/06/11 73.483 kg (162 lb)     Intake/Output Summary (Last 24 hours) at 06/07/11 1602 Last data filed at 06/07/11 1420  Gross per 24 hour  Intake 257.33 ml  Output   2175 ml  Net -1917.67 ml    Exam Awake Alert, Oriented *3, No new F.N deficits, Normal affect [place]ia.AT,PERRAL Supple Neck,No JVD, No cervical lymphadenopathy appriciated.  Symmetrical Chest wall movement, Good air movement bilaterally, CTAB RRR,No Gallops,Rubs or new Murmurs, No Parasternal Heave +ve B.Sounds, Abd Soft, Non tender, No organomegaly appriciated, No rebound -guarding or rigidity. JP Abd drain in place. No Cyanosis, Clubbing or edema, No new Rash or bruise     Data Review  CBC  Lab 06/07/11 0500 06/05/11 0710 06/04/11 0600 06/03/11 0615 06/02/11 0610 06/01/11 1110  WBC 7.6 7.4 8.4 9.3 8.1 --  HGB 10.4* 10.3* 10.2* 10.3* 10.0* --  HCT 31.6* 31.8* 31.7* 30.9* 30.3* --  PLT 284 365 366 371 392 --  MCV 84.3 83.9 83.9 83.3 83.2 --  MCH 27.7 27.2 27.0 27.8 27.5 --  MCHC 32.9 32.4 32.2 33.3 33.0 --  RDW 18.4* 18.5* 18.7* 18.5* 18.6* --  LYMPHSABS -- -- -- -- -- 1.0  MONOABS -- -- -- -- -- 0.5  EOSABS -- -- -- -- -- 0.4  BASOSABS -- -- -- --  -- 0.0  BANDABS -- -- -- -- -- --    Chemistries   Lab 06/07/11 0500 06/06/11 0858 06/05/11 0710 06/04/11 0600 06/03/11 0615 06/01/11 0314  NA 131* 128* 130* 129* 129* --  K 3.6 3.7 3.6 3.5 3.2* --  CL 98 95* 96 97 97 --  CO2 24 24 25 26 25  --  GLUCOSE 135* 143* 125* 128* 146* --  BUN 12 14 13 12 12  --  CREATININE 0.46* 0.40* 0.41* 0.45* 0.41* --  CALCIUM 9.1 8.8 8.4 8.5 8.5 --  MG -- 1.5 1.4* -- -- 1.5  AST -- -- 13 -- -- 14  ALT -- -- 8 -- -- 11  ALKPHOS -- -- 96 -- -- 92  BILITOT -- -- 0.2* -- -- 0.1*   ------------------------------------------------------------------------------------------------------------------ estimated creatinine clearance is 61.8 ml/min (by C-G formula based on Cr of 0.46). ------------------------------------------------------------------------------------------------------------------ No results found for this basename: HGBA1C:2 in the last 72 hours ------------------------------------------------------------------------------------------------------------------ No results found for this basename: CHOL:2,HDL:2,LDLCALC:2,TRIG:2,CHOLHDL:2,LDLDIRECT:2 in the last 72 hours ------------------------------------------------------------------------------------------------------------------ No results found for this basename: TSH,T4TOTAL,FREET3,T3FREE,THYROIDAB in the last 72 hours ------------------------------------------------------------------------------------------------------------------ No results found for this basename: VITAMINB12:2,FOLATE:2,FERRITIN:2,TIBC:2,IRON:2,RETICCTPCT:2 in the last 72 hours  Coagulation profile No results found for this basename: INR:5,PROTIME:5 in the last 168 hours  No results found for this basename: DDIMER:2 in the last 72 hours  Cardiac Enzymes  Lab 06/01/11 1013 06/01/11 0313 05/31/11 1813  CKMB 1.7 1.5 1.6  TROPONINI <0.30 <0.30 <0.30  MYOGLOBIN -- -- --    ------------------------------------------------------------------------------------------------------------------ No results found for this basename: POCBNP:3 in the last 168 hours  Micro Results Recent Results (from the past 240 hour(s))  URINE CULTURE     Status: Normal   Collection Time   05/30/11  5:34 AM      Component Value Range Status Comment   Specimen Description URINE, CATHETERIZED   Final    Special Requests NONE   Final    Setup Time [id number]   Final    Colony Count >=100,000 COLONIES/ML   Final    Culture     Final    Value: PSEUDOMONAS AERUGINOSA     ESCHERICHIA COLI   Report Status 06/02/2011 FINAL   Final    Organism ID, Bacteria PSEUDOMONAS AERUGINOSA   Final    Organism ID, Bacteria ESCHERICHIA COLI   Final   CULTURE, ROUTINE-ABSCESS     Status: Normal   Collection Time   05/31/11  1:29 PM      Component Value Range Status Comment   Specimen Description ABSCESS ABDOMEN RUQ   Final    Special Requests PATIENT ON FOLLOWING VANC, CIRPO, PRIMAXIN   Final    Gram Stain     Final    Value: NO WBC SEEN     NO SQUAMOUS EPITHELIAL CELLS SEEN     NO ORGANISMS SEEN   Culture NO GROWTH 3 DAYS   Final    Report Status 06/03/2011 FINAL   Final     Radiology Reports Ct Guided Abscess Drain  05/31/2011  *RADIOLOGY REPORT*  Clinical Data: Intra-abdominal sub xyphoid abscess postoperatively  CT GUIDED INTRA-ABDOMINAL ABSCESS DRAINAGE  Date:  05/31/2011 11:23:00  Radiologist:  Judie Petit. Ruel Favors, M.D.  Medications:  2 mg Versed, 100 mcg Fentanyl  Guidance:  CT  Fluoroscopy time:  None.  Sedation time:  18 minutes  Contrast volume:  None.  Complications:  No immediate  PROCEDURE/FINDINGS:  Informed consent was obtained from the patient following explanation of the procedure, risks, benefits and alternatives. The patient understands, agrees and consents for the procedure. All questions were addressed.  A time out was performed.  Maximal barrier sterile technique utilized including  caps, mask, sterile gowns, sterile gloves, large sterile drape, hand hygiene, and betadine  Previous imaging was reviewed.  The midline sub xyphoid intra- abdominal abscess was localized with the patient supine utilizing noncontrast CT.  Under sterile conditions and local anesthesia, an 18 gauge 10 cm access needle was advanced from an anterior oblique approach into the fluid collection.  Syringe aspiration yielded purulent fluid consistent with an abscess.  Guide wire advanced. Guide wire position confirmed with CT.  Tract dilatation performed to advance a 12-French drain with the retention loop formed in the cavity.  Syringe aspiration yielded 20 ml of thick purulent fluid consistent with an abscess.  Sample sent for Gram stain and culture.  Catheter secured with a Prolene suture and connected to external suction bulb.  Sterile dressing applied.  IMPRESSION: Successful CT guided sub xyphoid intra-abdominal abscess drain placement.  Original Report Authenticated By: Judie Petit. Ruel Favors, M.D.   Ct Abdomen Pelvis W Contrast  05/30/2011  *RADIOLOGY REPORT*  Clinical Data: Recent exploratory laparotomy, large  pyloric ulcer with perforated.  CT ABDOMEN AND PELVIS WITH CONTRAST  Technique:  Multidetector CT imaging of the abdomen and pelvis was performed following the standard protocol during bolus administration of intravenous contrast.  Contrast: OMNIPAQUE IOHEXOL 300 MG/ML IV SOLN  Comparison: CT 05/07/2011  Findings: There is a gastrostomy tube within the stomach. Postsurgical change consistent with partial gastrectomy.  There is a percutaneous drain in the right abdominal wall which extends to the left upper quadrant inferior to the stomach.  A second surgical drain extends inferiorly and superiorly with tip in the left upper quadrant adjacent to the spleen.  There is a small fluid collection adjacent to the left lateral margin of the liver which measures 7.1 x 4.0 cm.  This is adjacent the left lesser  curvature of the stomach and does have a thin rim of enhancement.(image 26).  This is similar to prior the collection measures 6.4 x 4.9 cm.  There is a 3.7 x 4.2 cm collection which has a small amount interspersed gas at the surgical site along the left hepatic margin.  This is decreased from 4.5 x 4.6 cm on prior. There is additional fluid collection lateral to the stomach measuring 2.5 x 3.2 cm (image 31) which is increased from 1.7 x 3.0 cm. There is a tiny 6 mm focus of gas, lateral to the duodenum,  which is clearly seen on prior (image 34).  There is mild bibasilar atelectasis and small effusions.  No pericardial fluid.  No focal hepatic lesion.  The gallbladder is absent.  Pancreas is normal.  The spleen is normal.  Adrenal glands and kidneys are normal.  No evidence of bowel obstruction.  There is diverticula the sigmoid colon.  Abdominal aorta normal caliber.  No free fluid the pelvis.  Foley catheter in the bladder.  Uterus is normal. Review of  bone windows demonstrates no aggressive osseous lesions.  IMPRESSION:  1.  Fluid collection with interspersed gas along the surgical site posterior to the left hepatic lobe is decreased slightly in size.  2.  Fluid collection lateral to the left hepatic lobe is not changed. 3.  Fluid collection lateral to the stomach is slightly increased in size.  4.  Tiny focus of gas and at the porta hepatis is not clearly seen on prior.  Original Report Authenticated By: Genevive Bi, M.D.   Ct Abdomen Pelvis W Contrast  05/18/2011  *RADIOLOGY REPORT*  Clinical Data: Recent of pill Roth to surgery.  Intra-abdominal abscess.  CT ABDOMEN AND PELVIS WITH CONTRAST  Technique:  Multidetector CT imaging of the abdomen and pelvis was performed following the standard protocol during bolus administration of intravenous contrast.  Contrast: OMNIPAQUE IOHEXOL 300 MG/ML IV SOLN  Comparison: 05/07/2011  Findings: Small bilateral pleural effusions with overlying atelectasis  noted.  No pericardial effusion.  No focal liver abnormality noted.  No splenic abnormalities identified.  The adrenal glands both appear within normal limits.  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  A cyst is noted within the upper pole of the left kidney measuring 0.6 cm.  There is no upper abdominal adenopathy.  No pelvic adenopathy noted.  The urinary bladder is collapsed around a Foley catheter balloon.  Within the upper abdomen there are postoperative changes consistent with the overall to surgery. A percutaneous gastrostomy tube is in place.  Additionally there is a left abdominal percutaneous jejunostomy tube.  A surgical drainage catheter is identified which enters the  right abdomen and crosses the midline to the left upper quadrant of the abdomen.  There has been cholecystectomy.  The proximal small bowel loops are increased in caliber measuring up to 4.9 cm.  There is enteric contrast material identified within the colon up to the level of the splenic flexure.  There is a moderate amount of fluid identified within the upper abdomen.  At this time there is no mature, enhancing wall surrounding the upper abdominal fluid to suggest abscess.  Review of the visualized osseous structures is significant for lumbar degenerative disc disease and right total hip arthroplasty device.  IMPRESSION:  1.  No discrete peripherally enhancing fluid collections within the upper abdomen to suggest abscess.  There is a moderate amount of fluid within the upper abdomen which is nonspecific in the early postoperative period. 2.  Postop changes consistent with Billroth II gastric surgery. There is nonspecific increased caliber of the proximal small bowel which may represent postoperative ileus. 3.  Cholecystectomy.  Original Report Authenticated By: Rosealee Albee, M.D.   Dg Chest Port 1 View  05/29/2011  *RADIOLOGY REPORT*  Clinical Data: Febrile.  Low oxygen saturations.  PORTABLE  CHEST - 1 VIEW  Comparison: Chest radiograph 05/19/2011  Findings: Right upper extremity PICC terminates in the distal superior vena cava.  Heart size within normal limits portable technique.  Mediastinal contour stable.  Lung volumes are low bilaterally with linear atelectasis at the right lung base.  There is peribronchial thickening.  No visible airspace disease or pleural effusion.  Osteopenia.  IMPRESSION: Low lung volumes with right basilar atelectasis and peribronchial thickening.  Original Report Authenticated By: Britta Mccreedy, M.D.   Dg Chest Port 1 View  05/19/2011  *RADIOLOGY REPORT*  Clinical Data: Check endotracheal tube  PORTABLE CHEST - 1 VIEW  Comparison: 05/17/2011  Findings: Cardiomediastinal silhouette is stable.  Endotracheal tube has been removed.  No diagnostic pneumothorax.  Mild basilar atelectasis or infiltrate.  Stable right arm PICC line position. No pulmonary edema.  IMPRESSION: Endotracheal tube has been removed.  No diagnostic pneumothorax. Mild basilar atelectasis or infiltrate.  Stable right arm PICC line position. No pulmonary edema.  Original Report Authenticated By: Natasha Mead, M.D.   Dg Chest Port 1 View  05/17/2011  *RADIOLOGY REPORT*  Clinical Data: ET tube placement  PORTABLE CHEST - 1 VIEW  Comparison: 05/16/2011  Findings: Endotracheal tube terminates 2.5 cm above the carina.  Low lung volumes with bibasilar atelectasis and small left pleural effusion. No pneumothorax.  IMPRESSION: Endotracheal tube terminates 2.5 cm above the carina.  Low lung volumes with bibasilar atelectasis and small left pleural effusion.  Original Report Authenticated By: Charline Bills, M.D.   Dg Chest Port 1 View  05/16/2011  *RADIOLOGY REPORT*  Clinical Data: ET tube placement.  PORTABLE CHEST - 1 VIEW  Comparison: Chest 05/14/2011.  Findings: Endotracheal tube is in place with tip 2 cm above the carina.  Left IJ catheter has been removed.  There is a new right PICC with the tip in the  lower superior vena cava.  Left worse than right basilar airspace disease persists but appears improved somewhat.  No pneumothorax is identified.  Heart size is normal.  IMPRESSION:  1.  ET tube tip is 2 cm above the carina. 2.  New right PICC in good position. 3.  Some improvement in bibasilar airspace opacities.  Original Report Authenticated By: Bernadene Bell. Maricela Curet, M.D.   Dg Chest Port 1 View  05/14/2011  *RADIOLOGY REPORT*  Clinical  Data: Evaluate ET tube placement  PORTABLE CHEST - 1 VIEW  Comparison: 05/13/2011  Findings: The endotracheal tube tip is above the carina.  There is a left IJ catheter with tip in the SVC.  Stable cardiac enlargement.  Lung volumes are low and there is interstitial edema bilaterally. Pleural effusions are also noted.  IMPRESSION:  1.  Low lung volumes and congestive heart failure. 2.  Stable position of ET tube.  Original Report Authenticated By: Rosealee Albee, M.D.   Dg Chest Port 1 View  05/13/2011  *RADIOLOGY REPORT*  Clinical Data: Respiratory failure.  PORTABLE CHEST - 1 VIEW  Comparison: 05/12/2011  Findings: Endotracheal tube and left jugular vein catheter appear in good position, unchanged.  Bibasilar atelectasis and possible left effusion are essentially unchanged.  IMPRESSION: No significant change.  Bibasilar atelectasis and possible left effusion.  Original Report Authenticated By: Gwynn Burly, M.D.   Dg Chest Portable 1 View  05/12/2011  *RADIOLOGY REPORT*  Clinical Data: Endotracheal tube placement  PORTABLE CHEST - 1 VIEW  Comparison: May 11, 2011  Findings: Endotracheal tube is in grossly good position with tip several centimeters above the carina.  No change is noted in position of left internal jugular catheter line.  Increased left lower lobe opacity is noted most consistent with pleural effusion although underlying pneumonia or atelectasis cannot be excluded.  IMPRESSION: Worsening left lower lobe opacity most consistent with pleural  effusion and possible underlying pneumonia or atelectasis.  Original Report Authenticated By: Venita Sheffield., M.D.   Dg Chest Portable 1 View  05/11/2011  *RADIOLOGY REPORT*  Clinical Data: ET tube placement.  PORTABLE CHEST - 1 VIEW  Comparison: Portable chest 05/09/2011.  Findings: The patient's endotracheal tube has been pulled back with the tip now in good position, 3 cm above the carina.  Support apparatus is otherwise unchanged with fatty left chest tube noted. There is no pneumothorax.  Left effusion and basilar airspace disease have improved.  Mild atelectasis in the right base is noted.  Cardiomegaly.  IMPRESSION:  1.  ET tube in good position. 2.  Decreased left effusion and basilar airspace disease.  Original Report Authenticated By: Bernadene Bell. D'ALESSIO, M.D.   Dg Chest Portable 1 View  05/09/2011  *RADIOLOGY REPORT*  Clinical Data: Repositioned endotracheal tube  PORTABLE CHEST - 1 VIEW  Comparison: 05/09/2011; 05/07/2011  Findings: Unchanged enlarged cardiac silhouette and mediastinal contours.  No significant interval change in endotracheal tube positioning with tip remains approximately 1 cm from the carina. Grossly unchanged position of left jugular approach and venous catheter tip overlying the superior cavoatrial junction.  No changed minimally improved aeration of the right lung base. The pulmonary vasculature remains indistinct.  Grossly unchanged hazy left basilar opacity likely representing a layering pleural effusion.  Grossly unchanged left basilar heterogeneous / consolidative opacities.  No definite pneumothorax.  Unchanged bones.  IMPRESSION: 1.  No interval change in endotracheal tube positioning with tip again approximate 1 cm from the carina.  Otherwise, unchanged support apparatus.  No definite pneumothorax. 2.  Minimally improved aeration of the right lung base with persistent findings of mild pulmonary edema and layering left sided effusion and left basilar heterogeneous  / consolidative opacities, possibly atelectasis though underlying infection is not excluded.  Original Report Authenticated By: Waynard Reeds, M.D.   Dg Chest Portable 1 View  05/09/2011  *RADIOLOGY REPORT*  Clinical Data: Follow up respiratory failure  PORTABLE CHEST - 1 VIEW  Comparison: 05/07/2011  Findings: ET tube remains low at the right mainstem bronchus orientation.  Recommend pulling back by 2-3 cm.  Left IJ catheter tip is in the SVC.  Interval increase in bilateral pleural effusions and interstitial edema.  IMPRESSION:  1.  Increase in CHF pattern. 2.  ET tube remains too low.  Original Report Authenticated By: Rosealee Albee, M.D.   Dg Abd Portable 1v  05/17/2011  *RADIOLOGY REPORT*  Clinical Data: Abdominal distention  ABDOMEN - 1 VIEW  Comparison: 05/12/2011  Findings: Drains are again noted over the abdomen.  Minimal retained contrast noted in nondilated cecum.  Clips overlie the left of midline. Presence or absence of air fluid levels or free air cannot be assessed on this single supine view.  No gas filled dilated loop of bowel is identified.  There is minimal possible free air beneath the left hemidiaphragm as seen on prior exams. Right total hip arthroplasty again noted.  IMPRESSION: No supine evidence for gaseous bowel distention.  Original Report Authenticated By: Harrel Lemon, M.D.   Dg Abd Portable 1v  05/12/2011  *RADIOLOGY REPORT*  Clinical Data: Abdominal distention  ABDOMEN - 1 VIEW  Comparison: 05/07/2011  Findings: There is a gastrostomy tube identified within the left upper quadrant of the abdomen.  The surgical drainage catheters are noted with tips in the projection of the left upper quadrant the abdomen.  The bowel gas pattern appears nonobstructed.  No dilated loops of small bowel or air-fluid levels.  IMPRESSION:  1.  There are no specific features identified to suggest bowel obstruction.  Original Report Authenticated By: Rosealee Albee, M.D.   Dg [name]  [place] Sol Cm  05/25/2011  *RADIOLOGY REPORT*  Clinical Data:Bilious drainage per G tube status post gastric antrectomy and partial duodenectomy with Billroth II anastomosis for perforated ulcer 05/07/2011.  Evaluate patency of anastomosis.  WATER SOLUBLE UPPER GI SERIES  Technique:  Single-column upper GI series was performed using water soluble contrast.  Fluoroscopy Time: 1.46 minutes.  Contrast: 120 ml Omnipaque-300 per percutaneous gastrostomy tube.  Comparison: CT 05/18/2011.  Findings: The scout abdominal radiograph demonstrates a percutaneous gastrostomy and jejunostomy.  There is a small amount of residual enteric contrast in the colon.  Water-soluble contrast administered per the G tube demonstrates it to be well positioned with rapid filling of the proximal stomach.  There is rapid emptying into what was presumed to represent the efferent loop.  Small bowel contrast appears to rapidly extend into the right lower quadrant and fill the lumen of the cecum. The segment of the small bowel between the stomach and cecum appears to be significantly shorter than that suggested on CT, implying possible development of a fistulous communication.  No extravasation is identified.  No opacification of the afferent loop was achieved.  There was significant reflux into the esophagus to the level of the cricopharyngeus while turning the patient.  IMPRESSION:  1.  Percutaneous gastrostomy tube appears well positioned. There is no extravasation. 2.  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  As correlated with the recent CT, the efferent loop is not clearly opacified but did appear to be patent on CT.  Follow-up CT may be helpful to better define this anatomy. 3.  Significant gastroesophageal reflux.  Original Report Authenticated By: Gerrianne Scale, M.D.    Scheduled Meds:   . heparin  subcutaneous  5,000 Units Subcutaneous  Q8H  . imipenem-cilastatin  500 mg Intravenous Q8H  . lip balm  1 application Topical BID  . metoprolol  5 mg Intravenous Q12H  . pantoprazole (PROTONIX) IV  40 mg Intravenous Q12H  . DISCONTD: insulin aspart  0-9 Units Subcutaneous Q6H   Continuous Infusions:   . sodium chloride 50 mL/hr at 06/07/11 0545  . fat emulsion 250 mL (06/05/11 1830)  . TPN (CLINIMIX) +/- additives    . TPN (CLINIMIX) +/- additives 80 mL/hr at 06/06/11 2351  . TPN (CLINIMIX) +/- additives     PRN Meds:.acetaminophen, hydrALAZINE, LORazepam, morphine injection, phenol, sodium chloride  Assessment & Plan   Principal Problem:  *Abdominal pain, acute Active Problems:  DIABETES MELLITUS, TYPE II  HYPERTENSION  Anemia  Hyponatremia  Metabolic acidosis  Esophagitis, erosive  Acute respiratory failure  1-Abdominal pain, acute (05/07/2011) - Secondary to recent bowel perforation and s/p surgery : Exp. Lap, lysis of adhesions, antrectomy,proximal and duodenal resection, bilroth II, gastrojejunostomy anastomosis, Omental patch of duodenal bulb, gastrostomy, jejunostomy. 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 Surg and IR following, repeat CT soon, D/W ID Dr Orvan Falconer on 06-07-11 4pm - continue Primaxin for 3 weeks from 05-31-11 and then based on the clinical picture. Continue TPN , Bowel rest with clears.   Patient on imipenem.  Imipenem started Nov 8 and stopped nov 22. Then restarted Nov 30. ---   Culture Abscess no growth.  WBC decreasing, afebrile.    2-Sepsis (05/07/2011), Erosive esophagitis and acute abdomen. Due to #1 above Sepsis resolved.    3-DIABETES MELLITUS, TYPE II (09/13/2007) SSI.  CBG (last 3)   Basename 06/07/11 0026 06/06/11 2037 06/06/11 1614  GLUCAP 133* 126* 133*       4-HYPERTENSION (09/13/2007) Hydralazine PRN.   Filed Vitals:   06/06/11 1341 06/06/11 2137 06/07/11 0513 06/07/11 1416  BP: 140/76 128/68 159/90 170/90  Pulse: 93 92 97 98  Temp: 98.9 F (37.2 C) 99 F (37.2 C) 98.9 F (37.2 C) 99.3 F (37.4 C)  TempSrc: Oral Oral Oral Oral  Resp: 21 20 20 18   Height:      Weight:      SpO2: 95% 99% 97% 99%    5-Anemia (01/26/2011) Stable.   6-Hyponatremia (01/28/2011) NS with TNA - Na better, check OSM    7-Acute respiratory failure (05/12/2011) Resolved.  ETT 11/8>>> 11/18. Secondary to sepsis and fluid OD from #1, now stable on RA.  8-UTI : urine with E coli and cipro. Ua 12-01. Suspect imipenem will cover both. Clinically resolved.  9-Hypokalemia: Repleted. Stable.  10-DVT Prophylaxis: Heparin.   Disposition: Patient will likely benefit from long term facility  DVT Prophylaxis  Heparin   See all Orders from today for further details   Leroy Sea M.D 06/07/2011, 4:02 PM  [facility] Office  [phone]

## 2011-06-07 NOTE — Progress Notes (Signed)
Patient ID: [name], female   DOB: 10/08/1931, 77 y.o.   MRN: [id number] 31 Days Post-Op  Subjective: No complaints  Objective: Vital signs in last 24 hours: Temp:  [98.9 F (37.2 C)-99 F (37.2 C)] 98.9 F (37.2 C) (12/09 0513) Pulse Rate:  [92-97] 97  (12/09 0513) Resp:  [20-21] 20  (12/09 0513) BP: (128-159)/(68-90) 159/90 mmHg (12/09 0513) SpO2:  [95 %-99 %] 97 % (12/09 0513) Last BM Date: 06/06/11  Intake/Output from previous day: 12/08 0701 - 12/09 0700 In: 257.3 [I.V.:257.3] Out: 2100 [Urine:2100] Intake/Output this shift:    GI: soft and nontender. Midline wound is clean. G-tube is intact.  Lab Results:   Basename 06/07/11 0500 06/05/11 0710  WBC 7.6 7.4  HGB 10.4* 10.3*  HCT 31.6* 31.8*  PLT 284 365   BMET  Basename 06/07/11 0500 06/06/11 0858  NA 131* 128*  K 3.6 3.7  CL 98 95*  CO2 24 24  GLUCOSE 135* 143*  BUN 12 14  CREATININE 0.46* 0.40*  CALCIUM 9.1 8.8   PT/INR No results found for this basename: LABPROT:2,INR:2 in the last 72 hours ABG No results found for this basename: PHART:2,PCO2:2,PO2:2,HCO3:2 in the last 72 hours  Studies/Results: No results found.  Anti-infectives: Anti-infectives     Start     Dose/Rate Route Frequency Ordered Stop   06/03/11 2115   ciprofloxacin (CIPRO) IVPB 400 mg  Status:  Discontinued     Comments: Please mix with NS.      400 mg 200 mL/hr over 60 Minutes Intravenous Every 12 hours 06/03/11 2114 06/03/11 2121   05/30/11 1130   ciprofloxacin (CIPRO) IVPB 400 mg  Status:  Discontinued        400 mg 200 mL/hr over 60 Minutes Intravenous 2 times daily 05/30/11 1038 06/02/11 1618   05/29/11 2100   imipenem-cilastatin (PRIMAXIN) 500 mg in sodium chloride 0.9 % 100 mL IVPB        500 mg 200 mL/hr over 30 Minutes Intravenous Every 8 hours 05/29/11 2007     05/29/11 2100   vancomycin (VANCOCIN) 1,250 mg in sodium chloride 0.9 % 250 mL IVPB  Status:  Discontinued        1,250 mg 166.7 mL/hr over 90 Minutes  Intravenous Every 24 hours 05/29/11 2007 06/03/11 1006   05/07/11 1400   imipenem-cilastatin (PRIMAXIN) 500 mg in sodium chloride 0.9 % 100 mL IVPB        500 mg 200 mL/hr over 30 Minutes Intravenous 3 times per day 05/07/11 1106 05/21/11 1359   05/07/11 1200   micafungin (MYCAMINE) 100 mg in sodium chloride 0.9 % 100 mL IVPB  Status:  Discontinued     Comments: PER PHARMACY      100 mg 100 mL/hr over 1 Hours Intravenous Every 24 hours 05/07/11 1032 05/19/11 1103   05/07/11 1200   vancomycin (VANCOCIN) 1,250 mg in sodium chloride 0.9 % 250 mL IVPB  Status:  Discontinued        1,250 mg 166.7 mL/hr over 90 Minutes Intravenous Every 24 hours 05/07/11 1106 05/19/11 1103   05/07/11 1000   micafungin (MYCAMINE) 150 mg in sodium chloride 0.9 % 100 mL IVPB  Status:  Discontinued     Comments: PER PHARMACY      150 mg 100 mL/hr over 1 Hours Intravenous Daily 05/07/11 0951 05/07/11 1029   05/07/11 0300   ertapenem (INVANZ) 1 g in sodium chloride 0.9 % 50 mL IVPB  1 g 100 mL/hr over 30 Minutes Intravenous  Once 05/07/11 0300 05/07/11 0348   05/07/11 0130   cefTRIAXone (ROCEPHIN) 1 g in dextrose 5 % 50 mL IVPB        1 g 100 mL/hr over 30 Minutes Intravenous  Once 05/07/11 0122 05/07/11 0200          Assessment/Plan: s/p Procedure(s): EXPLORATORY LAPAROTOMY GASTROSTOMY GASTRECTOMY CHOLECYSTECTOMY continue TPN for nutritional support. She states that she is tolerating some clear liquids. No changes today.  LOS: 32 days    TOTH III,Jasmain Ahlberg S 06/07/2011

## 2011-06-08 ENCOUNTER — Inpatient Hospital Stay (HOSPITAL_COMMUNITY): Payer: Medicare HMO

## 2011-06-08 ENCOUNTER — Encounter (HOSPITAL_COMMUNITY): Payer: Self-pay | Admitting: Radiology

## 2011-06-08 LAB — GLUCOSE, CAPILLARY
Glucose-Capillary: 163 mg/dL — ABNORMAL HIGH (ref 70–99)
Glucose-Capillary: 144 mg/dL — ABNORMAL HIGH (ref 70–99)

## 2011-06-08 MED ORDER — TRACE MINERALS CR-CU-MN-SE-ZN 10-1000-500-60 MCG/ML IV SOLN
INTRAVENOUS | Status: AC
  Administered 2011-06-08: 18:00:00 via INTRAVENOUS
  Filled 2011-06-08: qty 2000

## 2011-06-08 MED ORDER — IOHEXOL 300 MG/ML  SOLN
80.0000 mL | Freq: Once | INTRAMUSCULAR | Status: AC | PRN
  Administered 2011-06-08: 80 mL via INTRAVENOUS

## 2011-06-08 MED ORDER — FAT EMULSION 20 % IV EMUL
240.0000 mL | INTRAVENOUS | Status: AC
  Administered 2011-06-08: 240 mL via INTRAVENOUS
  Filled 2011-06-08: qty 250

## 2011-06-08 NOTE — Progress Notes (Signed)
32 Days Post-Op  Subjective: No specific complaints.  Objective: Vital signs in last 24 hours: Temp:  [99.3 F (37.4 C)-99.4 F (37.4 C)] 99.4 F (37.4 C) (12/10 0522) Pulse Rate:  [91-100] 91  (12/10 0522) Resp:  [18-20] 18  (12/10 0522) BP: (129-171)/(77-90) 129/77 mmHg (12/10 0522) SpO2:  [94 %-99 %] 94 % (12/10 0522) Weight:  [167 lb 15.9 oz (76.2 kg)] 167 lb 15.9 oz (76.2 kg) (12/10 0612) Last BM Date: 06/06/11  Intake/Output from previous day: 12/09 0701 - 12/10 0700 In: 5459.5 [I.V.:1100.8; IV Piggyback:600; TPN:3758.7] Out: 2403 [Urine:2300; Drains:103] Intake/Output this shift:    Exam: Epigastric abscess drain intact. Clean dressing. Approximately 10 cc's white purulent drainage in bulb. Lab Results:   Basename 06/07/11 0500  WBC 7.6  HGB 10.4*  HCT 31.6*  PLT 284   BMET  Basename 06/07/11 0500 06/06/11 0858  NA 131* 128*  K 3.6 3.7  CL 98 95*  CO2 24 24  GLUCOSE 135* 143*  BUN 12 14  CREATININE 0.46* 0.40*  CALCIUM 9.1 8.8   PT/INR No results found for this basename: LABPROT:2,INR:2 in the last 72 hours ABG No results found for this basename: PHART:2,PCO2:2,PO2:2,HCO3:2 in the last 72 hours  Studies/Results: No results found.  Anti-infectives: Anti-infectives     Start     Dose/Rate Route Frequency Ordered Stop   06/03/11 2115   ciprofloxacin (CIPRO) IVPB 400 mg  Status:  Discontinued     Comments: Please mix with NS.      400 mg 200 mL/hr over 60 Minutes Intravenous Every 12 hours 06/03/11 2114 06/03/11 2121   05/30/11 1130   ciprofloxacin (CIPRO) IVPB 400 mg  Status:  Discontinued        400 mg 200 mL/hr over 60 Minutes Intravenous 2 times daily 05/30/11 1038 06/02/11 1618   05/29/11 2100   imipenem-cilastatin (PRIMAXIN) 500 mg in sodium chloride 0.9 % 100 mL IVPB        500 mg 200 mL/hr over 30 Minutes Intravenous Every 8 hours 05/29/11 2007     05/29/11 2100   vancomycin (VANCOCIN) 1,250 mg in sodium chloride 0.9 % 250 mL IVPB   Status:  Discontinued        1,250 mg 166.7 mL/hr over 90 Minutes Intravenous Every 24 hours 05/29/11 2007 06/03/11 1006   05/07/11 1400   imipenem-cilastatin (PRIMAXIN) 500 mg in sodium chloride 0.9 % 100 mL IVPB        500 mg 200 mL/hr over 30 Minutes Intravenous 3 times per day 05/07/11 1106 05/21/11 1359   05/07/11 1200   micafungin (MYCAMINE) 100 mg in sodium chloride 0.9 % 100 mL IVPB  Status:  Discontinued     Comments: PER PHARMACY      100 mg 100 mL/hr over 1 Hours Intravenous Every 24 hours 05/07/11 1032 05/19/11 1103   05/07/11 1200   vancomycin (VANCOCIN) 1,250 mg in sodium chloride 0.9 % 250 mL IVPB  Status:  Discontinued        1,250 mg 166.7 mL/hr over 90 Minutes Intravenous Every 24 hours 05/07/11 1106 05/19/11 1103   05/07/11 1000   micafungin (MYCAMINE) 150 mg in sodium chloride 0.9 % 100 mL IVPB  Status:  Discontinued     Comments: PER PHARMACY      150 mg 100 mL/hr over 1 Hours Intravenous Daily 05/07/11 0951 05/07/11 1029   05/07/11 0300   ertapenem (INVANZ) 1 g in sodium chloride 0.9 % 50 mL IVPB  1 g 100 mL/hr over 30 Minutes Intravenous  Once 05/07/11 0300 05/07/11 0348   05/07/11 0130   cefTRIAXone (ROCEPHIN) 1 g in dextrose 5 % 50 mL IVPB        1 g 100 mL/hr over 30 Minutes Intravenous  Once 05/07/11 0122 05/07/11 0200          Assessment/Plan: s/p Procedure(s): EXPLORATORY LAPAROTOMY GASTROSTOMY GASTRECTOMY CHOLECYSTECTOMY Plan: S/P drain placement 05/31/11 by Dr. Miles Costain for epigastric abscess post partial gastrectomy. Continue to follow. Patient on multiple antibiotics. Low grade temp.  LOS: 33 days    [name] 06/08/2011

## 2011-06-08 NOTE — Progress Notes (Signed)
CSW was informed that patient would most likely stay at hospital due to insurance issues. CSW is signing off but can be consulted if SNF becomes appropriate again. [place], [place] [phone]

## 2011-06-08 NOTE — Progress Notes (Signed)
Patient ID: [name], female   DOB: 12-23-1931, 77 y.o.   MRN: [id number] 32 Days Post-Op  Subjective: Pt c/o some abdominal discomfort.  Tolerating clear liquids  Objective: Vital signs in last 24 hours: Temp:  [99.3 F (37.4 C)-99.4 F (37.4 C)] 99.4 F (37.4 C) (12/10 0522) Pulse Rate:  [91-100] 91  (12/10 0522) Resp:  [18-20] 18  (12/10 0522) BP: (129-171)/(77-90) 129/77 mmHg (12/10 0522) SpO2:  [94 %-99 %] 94 % (12/10 0522) Weight:  [167 lb 15.9 oz (76.2 kg)] 167 lb 15.9 oz (76.2 kg) (12/10 0612) Last BM Date: 06/06/11  Intake/Output from previous day: 12/09 0701 - 12/10 0700 In: 5459.5 [I.V.:1100.8; IV Piggyback:600; TPN:3758.7] Out: 2403 [Urine:2300; Drains:103] Intake/Output this shift:    PE: Abd: soft, wound healing well.  Small amount of granulation tissue present.  G tube in place with some bilious drainage.  JP#1 with some serous drainage.  JP#2 with some tan cloudy drainage.  abd is otherwise soft and nontender  Lab Results:   Basename 06/07/11 0500  WBC 7.6  HGB 10.4*  HCT 31.6*  PLT 284   BMET  Basename 06/07/11 0500 06/06/11 0858  NA 131* 128*  K 3.6 3.7  CL 98 95*  CO2 24 24  GLUCOSE 135* 143*  BUN 12 14  CREATININE 0.46* 0.40*  CALCIUM 9.1 8.8   PT/INR No results found for this basename: LABPROT:2,INR:2 in the last 72 hours   Studies/Results: No results found.  Anti-infectives: Anti-infectives     Start     Dose/Rate Route Frequency Ordered Stop   06/03/11 2115   ciprofloxacin (CIPRO) IVPB 400 mg  Status:  Discontinued     Comments: Please mix with NS.      400 mg 200 mL/hr over 60 Minutes Intravenous Every 12 hours 06/03/11 2114 06/03/11 2121   05/30/11 1130   ciprofloxacin (CIPRO) IVPB 400 mg  Status:  Discontinued        400 mg 200 mL/hr over 60 Minutes Intravenous 2 times daily 05/30/11 1038 06/02/11 1618   05/29/11 2100  imipenem-cilastatin (PRIMAXIN) 500 mg in sodium chloride 0.9 % 100 mL IVPB       500 mg 200 mL/hr over  30 Minutes Intravenous Every 8 hours 05/29/11 2007     05/29/11 2100   vancomycin (VANCOCIN) 1,250 mg in sodium chloride 0.9 % 250 mL IVPB  Status:  Discontinued        1,250 mg 166.7 mL/hr over 90 Minutes Intravenous Every 24 hours 05/29/11 2007 06/03/11 1006   05/07/11 1400  imipenem-cilastatin (PRIMAXIN) 500 mg in sodium chloride 0.9 % 100 mL IVPB       500 mg 200 mL/hr over 30 Minutes Intravenous 3 times per day 05/07/11 1106 05/21/11 1359   05/07/11 1200   micafungin (MYCAMINE) 100 mg in sodium chloride 0.9 % 100 mL IVPB  Status:  Discontinued     Comments: PER PHARMACY      100 mg 100 mL/hr over 1 Hours Intravenous Every 24 hours 05/07/11 1032 05/19/11 1103   05/07/11 1200   vancomycin (VANCOCIN) 1,250 mg in sodium chloride 0.9 % 250 mL IVPB  Status:  Discontinued        1,250 mg 166.7 mL/hr over 90 Minutes Intravenous Every 24 hours 05/07/11 1106 05/19/11 1103   05/07/11 1000   micafungin (MYCAMINE) 150 mg in sodium chloride 0.9 % 100 mL IVPB  Status:  Discontinued     Comments: PER PHARMACY  150 mg 100 mL/hr over 1 Hours Intravenous Daily 05/07/11 0951 05/07/11 1029   05/07/11 0300   ertapenem (INVANZ) 1 g in sodium chloride 0.9 % 50 mL IVPB        1 g 100 mL/hr over 30 Minutes Intravenous  Once 05/07/11 0300 05/07/11 0348   05/07/11 0130   cefTRIAXone (ROCEPHIN) 1 g in dextrose 5 % 50 mL IVPB        1 g 100 mL/hr over 30 Minutes Intravenous  Once 05/07/11 0122 05/07/11 0200           Assessment/Plan  1. S/p B2 with G-J tube, J tube fell out 2. PCM/TNA 3. FTT 4. Intra-abd fluid collections  Plan: Cont bowel rest and TNA Patient needs repeat CT scan to reevaluate her drains and fluid collections.    LOS: 33 days    Patrick Salemi E 06/08/2011

## 2011-06-08 NOTE — Progress Notes (Signed)
Tolerating diet Afebrile, nl WBC Will repeat CT scan to evaluate fluid collections/ drains.  If fluid collections resolved, will d/c drains.  Wilmon Arms. Corliss Skains, MD, [facility]  06/08/2011 10:22 AM

## 2011-06-08 NOTE — Progress Notes (Signed)
[name] ZOX:[id number] is a 77 y.o. female,  Outpatient Primary MD for the patient is [name] Overman, MD, MD   Chief Complaint  Patient presents with  . Headache        Subjective:   [name] today has, No headache, No chest pain, No abdominal pain - No Nausea, No new weakness tingling or numbness, No Cough - SOB.     Objective:   Filed Vitals:   06/07/11 2105 06/08/11 0522 06/08/11 0612 06/08/11 1344  BP: 171/83 129/77  155/84  Pulse: 100 91  93  Temp: 99.4 F (37.4 C) 99.4 F (37.4 C)  99.5 F (37.5 C)  TempSrc: Oral Oral    Resp: 20 18  20   Height:      Weight:   76.2 kg (167 lb 15.9 oz)   SpO2: 97% 94%  97%     Wt Readings from Last 3 Encounters:  06/08/11 76.2 kg (167 lb 15.9 oz)  06/08/11 76.2 kg (167 lb 15.9 oz)  05/06/11 73.483 kg (162 lb)      Intake/Output Summary (Last 24 hours) at 06/08/11 1351 Last data filed at 06/08/11 1300  Gross per 24 hour  Intake 5459.5 ml  Output   2853 ml  Net 2606.5 ml    Exam Awake Alert, Oriented *3, No new F.N deficits, Normal affect [place].AT,PERRAL Supple Neck,No JVD, No cervical lymphadenopathy appriciated.  Symmetrical Chest wall movement, Good air movement bilaterally, CTAB RRR,No Gallops,Rubs or new Murmurs, No Parasternal Heave +ve B.Sounds, Abd Soft, Non tender, No organomegaly appriciated, No rebound -guarding or rigidity. JP Abd drain in place. No Cyanosis, Clubbing or edema, No new Rash or bruise     Data Review  CBC  Lab 06/07/11 0500 06/05/11 0710 06/04/11 0600 06/03/11 0615 06/02/11 0610  WBC 7.6 7.4 8.4 9.3 8.1  HGB 10.4* 10.3* 10.2* 10.3* 10.0*  HCT 31.6* 31.8* 31.7* 30.9* 30.3*  PLT 284 365 366 371 392  MCV 84.3 83.9 83.9 83.3 83.2  MCH 27.7 27.2 27.0 27.8 27.5  MCHC 32.9 32.4 32.2 33.3 33.0  RDW 18.4* 18.5* 18.7* 18.5* 18.6*  LYMPHSABS -- -- -- -- --  MONOABS -- -- -- -- --  EOSABS -- -- -- -- --  BASOSABS -- -- -- -- --  BANDABS -- -- -- -- --    Chemistries   Lab  06/07/11 0500 06/06/11 0858 06/05/11 0710 06/04/11 0600 06/03/11 0615  NA 131* 128* 130* 129* 129*  K 3.6 3.7 3.6 3.5 3.2*  CL 98 95* 96 97 97  CO2 24 24 25 26 25   GLUCOSE 135* 143* 125* 128* 146*  BUN 12 14 13 12 12   CREATININE 0.46* 0.40* 0.41* 0.45* 0.41*  CALCIUM 9.1 8.8 8.4 8.5 8.5  MG -- 1.5 1.4* -- --  AST -- -- 13 -- --  ALT -- -- 8 -- --  ALKPHOS -- -- 96 -- --  BILITOT -- -- 0.2* -- --   ------------------------------------------------------------------------------------------------------------------ estimated creatinine clearance is 61.5 ml/min (by C-G formula based on Cr of 0.46).  Cardiac Enzymes No results found for this basename: CK:3,CKMB:3,TROPONINI:3,MYOGLOBIN:3 in the last 168 hours ------------------------------------------------------------------------------------------------------------------ No components found with this basename: POCBNP:3  Micro Results Recent Results (from the past 240 hour(s))  URINE CULTURE     Status: Normal   Collection Time   05/30/11  5:34 AM      Component Value Range Status Comment   Specimen Description URINE, CATHETERIZED   Final    Special Requests NONE  Final    Setup Time [id number]   Final    Colony Count >=100,000 COLONIES/ML   Final    Culture     Final    Value: PSEUDOMONAS AERUGINOSA     ESCHERICHIA COLI   Report Status 06/02/2011 FINAL   Final    Organism ID, Bacteria PSEUDOMONAS AERUGINOSA   Final    Organism ID, Bacteria ESCHERICHIA COLI   Final   CULTURE, ROUTINE-ABSCESS     Status: Normal   Collection Time   05/31/11  1:29 PM      Component Value Range Status Comment   Specimen Description ABSCESS ABDOMEN RUQ   Final    Special Requests PATIENT ON FOLLOWING VANC, CIRPO, PRIMAXIN   Final    Gram Stain     Final    Value: NO WBC SEEN     NO SQUAMOUS EPITHELIAL CELLS SEEN     NO ORGANISMS SEEN   Culture NO GROWTH 3 DAYS   Final    Report Status 06/03/2011 FINAL   Final     Radiology Reports Ct Guided  Abscess Drain  05/31/2011  *RADIOLOGY REPORT*  Clinical Data: Intra-abdominal sub xyphoid abscess postoperatively  CT GUIDED INTRA-ABDOMINAL ABSCESS DRAINAGE  Date:  05/31/2011 11:23:00  Radiologist:  Judie Petit. Ruel Favors, M.D.  Medications:  2 mg Versed, 100 mcg Fentanyl  Guidance:  CT  Fluoroscopy time:  None.  Sedation time:  18 minutes  Contrast volume:  None.  Complications:  No immediate  PROCEDURE/FINDINGS:  Informed consent was obtained from the patient following explanation of the procedure, risks, benefits and alternatives. The patient understands, agrees and consents for the procedure. All questions were addressed.  A time out was performed.  Maximal barrier sterile technique utilized including caps, mask, sterile gowns, sterile gloves, large sterile drape, hand hygiene, and betadine  Previous imaging was reviewed.  The midline sub xyphoid intra- abdominal abscess was localized with the patient supine utilizing noncontrast CT.  Under sterile conditions and local anesthesia, an 18 gauge 10 cm access needle was advanced from an anterior oblique approach into the fluid collection.  Syringe aspiration yielded purulent fluid consistent with an abscess.  Guide wire advanced. Guide wire position confirmed with CT.  Tract dilatation performed to advance a 12-French drain with the retention loop formed in the cavity.  Syringe aspiration yielded 20 ml of thick purulent fluid consistent with an abscess.  Sample sent for Gram stain and culture.  Catheter secured with a Prolene suture and connected to external suction bulb.  Sterile dressing applied.  IMPRESSION: Successful CT guided sub xyphoid intra-abdominal abscess drain placement.  Original Report Authenticated By: Judie Petit. Ruel Favors, M.D.   Ct Abdomen Pelvis W Contrast  05/30/2011  *RADIOLOGY REPORT*  Clinical Data: Recent exploratory laparotomy, large pyloric ulcer with perforated.  CT ABDOMEN AND PELVIS WITH CONTRAST  Technique:  Multidetector CT imaging of the  abdomen and pelvis was performed following the standard protocol during bolus administration of intravenous contrast.  Contrast: OMNIPAQUE IOHEXOL 300 MG/ML IV SOLN  Comparison: CT 05/07/2011  Findings: There is a gastrostomy tube within the stomach. Postsurgical change consistent with partial gastrectomy.  There is a percutaneous drain in the right abdominal wall which extends to the left upper quadrant inferior to the stomach.  A second surgical drain extends inferiorly and superiorly with tip in the left upper quadrant adjacent to the spleen.  There is a small fluid collection adjacent to the left lateral margin of the liver which measures 7.1  x 4.0 cm.  This is adjacent the left lesser curvature of the stomach and does have a thin rim of enhancement.(image 26).  This is similar to prior the collection measures 6.4 x 4.9 cm.  There is a 3.7 x 4.2 cm collection which has a small amount interspersed gas at the surgical site along the left hepatic margin.  This is decreased from 4.5 x 4.6 cm on prior. There is additional fluid collection lateral to the stomach measuring 2.5 x 3.2 cm (image 31) which is increased from 1.7 x 3.0 cm. There is a tiny 6 mm focus of gas, lateral to the duodenum,  which is clearly seen on prior (image 34).  There is mild bibasilar atelectasis and small effusions.  No pericardial fluid.  No focal hepatic lesion.  The gallbladder is absent.  Pancreas is normal.  The spleen is normal.  Adrenal glands and kidneys are normal.  No evidence of bowel obstruction.  There is diverticula the sigmoid colon.  Abdominal aorta normal caliber.  No free fluid the pelvis.  Foley catheter in the bladder.  Uterus is normal. Review of  bone windows demonstrates no aggressive osseous lesions.  IMPRESSION:  1.  Fluid collection with interspersed gas along the surgical site posterior to the left hepatic lobe is decreased slightly in size.  2.  Fluid collection lateral to the left hepatic lobe is not  changed. 3.  Fluid collection lateral to the stomach is slightly increased in size.  4.  Tiny focus of gas and at the porta hepatis is not clearly seen on prior.  Original Report Authenticated By: Genevive Bi, M.D.   Ct Abdomen Pelvis W Contrast  05/18/2011  *RADIOLOGY REPORT*  Clinical Data: Recent of pill Roth to surgery.  Intra-abdominal abscess.  CT ABDOMEN AND PELVIS WITH CONTRAST  Technique:  Multidetector CT imaging of the abdomen and pelvis was performed following the standard protocol during bolus administration of intravenous contrast.  Contrast: OMNIPAQUE IOHEXOL 300 MG/ML IV SOLN  Comparison: 05/07/2011  Findings: Small bilateral pleural effusions with overlying atelectasis noted.  No pericardial effusion.  No focal liver abnormality noted.  No splenic abnormalities identified.  The adrenal glands both appear within normal limits.  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  A cyst is noted within the upper pole of the left kidney measuring 0.6 cm.  There is no upper abdominal adenopathy.  No pelvic adenopathy noted.  The urinary bladder is collapsed around a Foley catheter balloon.  Within the upper abdomen there are postoperative changes consistent with the overall to surgery. A percutaneous gastrostomy tube is in place.  Additionally there is a left abdominal percutaneous jejunostomy tube.  A surgical drainage catheter is identified which enters the right abdomen and crosses the midline to the left upper quadrant of the abdomen.  There has been cholecystectomy.  The proximal small bowel loops are increased in caliber measuring up to 4.9 cm.  There is enteric contrast material identified within the colon up to the level of the splenic flexure.  There is a moderate amount of fluid identified within the upper abdomen.  At this time there is no mature, enhancing wall surrounding the upper abdominal fluid to suggest abscess.  Review of the visualized osseous  structures is significant for lumbar degenerative disc disease and right total hip arthroplasty device.  IMPRESSION:  1.  No discrete peripherally enhancing fluid collections within the upper abdomen to suggest abscess.  There is  a moderate amount of fluid within the upper abdomen which is nonspecific in the early postoperative period. 2.  Postop changes consistent with Billroth II gastric surgery. There is nonspecific increased caliber of the proximal small bowel which may represent postoperative ileus. 3.  Cholecystectomy.  Original Report Authenticated By: Rosealee Albee, M.D.   Dg Chest Port 1 View  05/29/2011  *RADIOLOGY REPORT*  Clinical Data: Febrile.  Low oxygen saturations.  PORTABLE CHEST - 1 VIEW  Comparison: Chest radiograph 05/19/2011  Findings: Right upper extremity PICC terminates in the distal superior vena cava.  Heart size within normal limits portable technique.  Mediastinal contour stable.  Lung volumes are low bilaterally with linear atelectasis at the right lung base.  There is peribronchial thickening.  No visible airspace disease or pleural effusion.  Osteopenia.  IMPRESSION: Low lung volumes with right basilar atelectasis and peribronchial thickening.  Original Report Authenticated By: Britta Mccreedy, M.D.   Dg Chest Port 1 View  05/19/2011  *RADIOLOGY REPORT*  Clinical Data: Check endotracheal tube  PORTABLE CHEST - 1 VIEW  Comparison: 05/17/2011  Findings: Cardiomediastinal silhouette is stable.  Endotracheal tube has been removed.  No diagnostic pneumothorax.  Mild basilar atelectasis or infiltrate.  Stable right arm PICC line position. No pulmonary edema.  IMPRESSION: Endotracheal tube has been removed.  No diagnostic pneumothorax. Mild basilar atelectasis or infiltrate.  Stable right arm PICC line position. No pulmonary edema.  Original Report Authenticated By: Natasha Mead, M.D.   Dg Chest Port 1 View  05/17/2011  *RADIOLOGY REPORT*  Clinical Data: ET tube placement  PORTABLE  CHEST - 1 VIEW  Comparison: 05/16/2011  Findings: Endotracheal tube terminates 2.5 cm above the carina.  Low lung volumes with bibasilar atelectasis and small left pleural effusion. No pneumothorax.  IMPRESSION: Endotracheal tube terminates 2.5 cm above the carina.  Low lung volumes with bibasilar atelectasis and small left pleural effusion.  Original Report Authenticated By: Charline Bills, M.D.   Dg Chest Port 1 View  05/16/2011  *RADIOLOGY REPORT*  Clinical Data: ET tube placement.  PORTABLE CHEST - 1 VIEW  Comparison: Chest 05/14/2011.  Findings: Endotracheal tube is in place with tip 2 cm above the carina.  Left IJ catheter has been removed.  There is a new right PICC with the tip in the lower superior vena cava.  Left worse than right basilar airspace disease persists but appears improved somewhat.  No pneumothorax is identified.  Heart size is normal.  IMPRESSION:  1.  ET tube tip is 2 cm above the carina. 2.  New right PICC in good position. 3.  Some improvement in bibasilar airspace opacities.  Original Report Authenticated By: Bernadene Bell. Maricela Curet, M.D.   Dg Chest Port 1 View  05/14/2011  *RADIOLOGY REPORT*  Clinical Data: Evaluate ET tube placement  PORTABLE CHEST - 1 VIEW  Comparison: 05/13/2011  Findings: The endotracheal tube tip is above the carina.  There is a left IJ catheter with tip in the SVC.  Stable cardiac enlargement.  Lung volumes are low and there is interstitial edema bilaterally. Pleural effusions are also noted.  IMPRESSION:  1.  Low lung volumes and congestive heart failure. 2.  Stable position of ET tube.  Original Report Authenticated By: Rosealee Albee, M.D.   Dg Chest Port 1 View  05/13/2011  *RADIOLOGY REPORT*  Clinical Data: Respiratory failure.  PORTABLE CHEST - 1 VIEW  Comparison: 05/12/2011  Findings: Endotracheal tube and left jugular vein catheter appear in good position, unchanged.  Bibasilar atelectasis and possible left effusion are essentially unchanged.   IMPRESSION: No significant change.  Bibasilar atelectasis and possible left effusion.  Original Report Authenticated By: Gwynn Burly, M.D.   Dg Chest Portable 1 View  05/12/2011  *RADIOLOGY REPORT*  Clinical Data: Endotracheal tube placement  PORTABLE CHEST - 1 VIEW  Comparison: May 11, 2011  Findings: Endotracheal tube is in grossly good position with tip several centimeters above the carina.  No change is noted in position of left internal jugular catheter line.  Increased left lower lobe opacity is noted most consistent with pleural effusion although underlying pneumonia or atelectasis cannot be excluded.  IMPRESSION: Worsening left lower lobe opacity most consistent with pleural effusion and possible underlying pneumonia or atelectasis.  Original Report Authenticated By: Venita Sheffield., M.D.   Dg Chest Portable 1 View  05/11/2011  *RADIOLOGY REPORT*  Clinical Data: ET tube placement.  PORTABLE CHEST - 1 VIEW  Comparison: Portable chest 05/09/2011.  Findings: The patient's endotracheal tube has been pulled back with the tip now in good position, 3 cm above the carina.  Support apparatus is otherwise unchanged with fatty left chest tube noted. There is no pneumothorax.  Left effusion and basilar airspace disease have improved.  Mild atelectasis in the right base is noted.  Cardiomegaly.  IMPRESSION:  1.  ET tube in good position. 2.  Decreased left effusion and basilar airspace disease.  Original Report Authenticated By: Bernadene Bell. D'ALESSIO, M.D.   Dg Chest Portable 1 View  05/09/2011  *RADIOLOGY REPORT*  Clinical Data: Repositioned endotracheal tube  PORTABLE CHEST - 1 VIEW  Comparison: 05/09/2011; 05/07/2011  Findings: Unchanged enlarged cardiac silhouette and mediastinal contours.  No significant interval change in endotracheal tube positioning with tip remains approximately 1 cm from the carina. Grossly unchanged position of left jugular approach and venous catheter tip overlying the  superior cavoatrial junction.  No changed minimally improved aeration of the right lung base. The pulmonary vasculature remains indistinct.  Grossly unchanged hazy left basilar opacity likely representing a layering pleural effusion.  Grossly unchanged left basilar heterogeneous / consolidative opacities.  No definite pneumothorax.  Unchanged bones.  IMPRESSION: 1.  No interval change in endotracheal tube positioning with tip again approximate 1 cm from the carina.  Otherwise, unchanged support apparatus.  No definite pneumothorax. 2.  Minimally improved aeration of the right lung base with persistent findings of mild pulmonary edema and layering left sided effusion and left basilar heterogeneous / consolidative opacities, possibly atelectasis though underlying infection is not excluded.  Original Report Authenticated By: Waynard Reeds, M.D.   Dg Chest Portable 1 View  05/09/2011  *RADIOLOGY REPORT*  Clinical Data: Follow up respiratory failure  PORTABLE CHEST - 1 VIEW  Comparison: 05/07/2011  Findings: ET tube remains low at the right mainstem bronchus orientation.  Recommend pulling back by 2-3 cm.  Left IJ catheter tip is in the SVC.  Interval increase in bilateral pleural effusions and interstitial edema.  IMPRESSION:  1.  Increase in CHF pattern. 2.  ET tube remains too low.  Original Report Authenticated By: Rosealee Albee, M.D.   Dg Abd Portable 1v  05/17/2011  *RADIOLOGY REPORT*  Clinical Data: Abdominal distention  ABDOMEN - 1 VIEW  Comparison: 05/12/2011  Findings: Drains are again noted over the abdomen.  Minimal retained contrast noted in nondilated cecum.  Clips overlie the left of midline. Presence or absence of air fluid levels or free air cannot be assessed on this  single supine view.  No gas filled dilated loop of bowel is identified.  There is minimal possible free air beneath the left hemidiaphragm as seen on prior exams. Right total hip arthroplasty again noted.  IMPRESSION: No supine  evidence for gaseous bowel distention.  Original Report Authenticated By: Harrel Lemon, M.D.   Dg Abd Portable 1v  05/12/2011  *RADIOLOGY REPORT*  Clinical Data: Abdominal distention  ABDOMEN - 1 VIEW  Comparison: 05/07/2011  Findings: There is a gastrostomy tube identified within the left upper quadrant of the abdomen.  The surgical drainage catheters are noted with tips in the projection of the left upper quadrant the abdomen.  The bowel gas pattern appears nonobstructed.  No dilated loops of small bowel or air-fluid levels.  IMPRESSION:  1.  There are no specific features identified to suggest bowel obstruction.  Original Report Authenticated By: Rosealee Albee, M.D.   Dg [name] W/water Sol Cm  05/25/2011  *RADIOLOGY REPORT*  Clinical Data:Bilious drainage per G tube status post gastric antrectomy and partial duodenectomy with Billroth II anastomosis for perforated ulcer 05/07/2011.  Evaluate patency of anastomosis.  WATER SOLUBLE UPPER GI SERIES  Technique:  Single-column upper GI series was performed using water soluble contrast.  Fluoroscopy Time: 1.46 minutes.  Contrast: 120 ml Omnipaque-300 per percutaneous gastrostomy tube.  Comparison: CT 05/18/2011.  Findings: The scout abdominal radiograph demonstrates a percutaneous gastrostomy and jejunostomy.  There is a small amount of residual enteric contrast in the colon.  Water-soluble contrast administered per the G tube demonstrates it to be well positioned with rapid filling of the proximal stomach.  There is rapid emptying into what was presumed to represent the efferent loop.  Small bowel contrast appears to rapidly extend into the right lower quadrant and fill the lumen of the cecum. The segment of the small bowel between the stomach and cecum appears to be significantly shorter than that suggested on CT, implying possible development of a fistulous communication.  No extravasation is identified.  No opacification of the afferent loop was  achieved.  There was significant reflux into the esophagus to the level of the cricopharyngeus while turning the patient.  IMPRESSION:  1.  Percutaneous gastrostomy tube appears well positioned. There is no extravasation. 2.  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  As correlated with the recent CT, the efferent loop is not clearly opacified but did appear to be patent on CT.  Follow-up CT may be helpful to better define this anatomy. 3.  Significant gastroesophageal reflux.  Original Report Authenticated By: Gerrianne Scale, M.D.    Scheduled Meds:    . alteplase  2 mg Intracatheter Once  . heparin subcutaneous  5,000 Units Subcutaneous Q8H  . imipenem-cilastatin  500 mg Intravenous Q8H  . lip balm  1 application Topical BID  . metoprolol  5 mg Intravenous Q12H  . pantoprazole (PROTONIX) IV  40 mg Intravenous Q12H   Continuous Infusions:    . sodium chloride 50 mL/hr at 06/07/11 0545  . fat emulsion    . TPN (CLINIMIX) +/- additives 80 mL/hr at 06/06/11 2351  . TPN (CLINIMIX) +/- additives 80 mL/hr at 06/07/11 1745  . TPN (CLINIMIX) +/- additives     PRN Meds:.acetaminophen, hydrALAZINE, LORazepam, morphine injection, phenol, sodium chloride  Assessment & Plan   1-Abdominal pain, acute (05/07/2011) - Secondary to recent bowel perforation and s/p  surgery : Exp. Lap, lysis of adhesions, antrectomy,proximal and duodenal resection, bilroth II, gastrojejunostomy anastomosis, Omental patch of duodenal bulb, gastrostomy, jejunostomy. 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 Surg and IR following, repeat CT soon, D/W ID Dr Orvan Falconer on 06-07-11 4pm - continue Primaxin for 3 weeks from 05-31-11 and then  based on the clinical picture. Continue TPN , Bowel rest with clears. Little more pain today CT ordered by Surg, D/W surgery Tresa Endo PA- she will monitor CT results and think about transferring care to Surg as Medicine has limited role. Culture Abscess no growth.   Patient on imipenem.  Imipenem started Nov 8 and stopped nov 22. Then restarted Nov 30.    2-Sepsis (05/07/2011), Erosive esophagitis and acute abdomen causing Acute respiratory failure (05/12/2011) Resolved.  ETT 11/8>>> 11/18.  Due to #1 above Sepsis and Resp failure resolved. On RA.   3-DIABETES MELLITUS, TYPE II (09/13/2007) SSI.  CBG (last 3)   Basename 06/08/11 0717 06/08/11 0448 06/08/11 0011  GLUCAP 163* 139* 138*    4-HYPERTENSION (09/13/2007) Hydralazine PRN.   Filed Vitals:   06/07/11 2105 06/08/11 0522 06/08/11 0612 06/08/11 1344  BP: 171/83 129/77  155/84  Pulse: 100 91  93  Temp: 99.4 F (37.4 C) 99.4 F (37.4 C)  99.5 F (37.5 C)  TempSrc: Oral Oral    Resp: 20 18  20   Height:      Weight:   76.2 kg (167 lb 15.9 oz)   SpO2: 97% 94%  97%    5-Anemia (01/26/2011) Stable.   6-Hyponatremia (01/28/2011) NS with TNA - Na better, check OSM stat.    7-UTI : urine with E coli and cipro. Ua 12-01. Suspect imipenem will cover both. Clinically resolved.  8-Hypokalemia: Repleted. Stable.   Disposition: Patient will likely benefit from long term facility  DVT Prophylaxis  Heparin   See all Orders from today for further details   Leroy Sea M.D 06/08/2011, 1:51 PM  [facility] Office  [phone]

## 2011-06-09 LAB — GLUCOSE, CAPILLARY
Glucose-Capillary: 146 mg/dL — ABNORMAL HIGH (ref 70–99)
Glucose-Capillary: 133 mg/dL — ABNORMAL HIGH (ref 70–99)
Glucose-Capillary: 121 mg/dL — ABNORMAL HIGH (ref 70–99)
Glucose-Capillary: 140 mg/dL — ABNORMAL HIGH (ref 70–99)

## 2011-06-09 LAB — CBC
HCT: 30.6 % — ABNORMAL LOW (ref 36.0–46.0)
MCHC: 32.4 g/dL (ref 30.0–36.0)
RDW: 18.2 % — ABNORMAL HIGH (ref 11.5–15.5)
WBC: 9.3 10*3/uL (ref 4.0–10.5)

## 2011-06-09 LAB — COMPREHENSIVE METABOLIC PANEL
ALT: 6 U/L (ref 0–35)
AST: 12 U/L (ref 0–37)
Albumin: 1.9 g/dL — ABNORMAL LOW (ref 3.5–5.2)
Alkaline Phosphatase: 87 U/L (ref 39–117)
BUN: 13 mg/dL (ref 6–23)
Chloride: 98 mEq/L (ref 96–112)
Potassium: 3.3 mEq/L — ABNORMAL LOW (ref 3.5–5.1)
Sodium: 129 mEq/L — ABNORMAL LOW (ref 135–145)
Total Bilirubin: 0.2 mg/dL — ABNORMAL LOW (ref 0.3–1.2)
Total Protein: 6.2 g/dL (ref 6.0–8.3)

## 2011-06-09 LAB — OSMOLALITY: Osmolality: 269 mOsm/kg — ABNORMAL LOW (ref 275–300)

## 2011-06-09 LAB — MAGNESIUM: Magnesium: 1.4 mg/dL — ABNORMAL LOW (ref 1.5–2.5)

## 2011-06-09 MED ORDER — MAGNESIUM SULFATE 40 MG/ML IJ SOLN
2.0000 g | Freq: Once | INTRAMUSCULAR | Status: AC
  Administered 2011-06-09: 2 g via INTRAVENOUS
  Filled 2011-06-09: qty 50

## 2011-06-09 MED ORDER — INSULIN REGULAR HUMAN 100 UNIT/ML IJ SOLN
INTRAVENOUS | Status: AC
  Administered 2011-06-09: 18:00:00 via INTRAVENOUS
  Filled 2011-06-09: qty 2000

## 2011-06-09 MED ORDER — CLONIDINE HCL 0.1 MG/24HR TD PTWK
0.1000 mg | MEDICATED_PATCH | TRANSDERMAL | Status: DC
  Administered 2011-06-09 – 2011-06-30 (×4): 0.1 mg via TRANSDERMAL
  Filled 2011-06-09 (×4): qty 1

## 2011-06-09 MED ORDER — POTASSIUM CHLORIDE 10 MEQ/100ML IV SOLN
10.0000 meq | INTRAVENOUS | Status: AC
  Administered 2011-06-09 (×3): 10 meq via INTRAVENOUS
  Filled 2011-06-09 (×3): qty 100

## 2011-06-09 MED ORDER — POTASSIUM CHLORIDE 10 MEQ/100ML IV SOLN: 10.0000 meq | INTRAVENOUS | Status: DC

## 2011-06-09 NOTE — Progress Notes (Signed)
Begin clear liquids with tube clamped.  If she does well with that, then we will wean TNA over the next few days.  Wilmon Arms. Corliss Skains, MD, [facility]  06/09/2011 9:03 AM

## 2011-06-09 NOTE — Progress Notes (Signed)
Patient ID: [name], female   DOB: [birth year], 77 y.o.   MRN: [id number] 33 Days Post-Op  Subjective: Pt c/o some nausea today, but otherwise no complaints.  Objective: Vital signs in last 24 hours: Temp:  [99.5 F (37.5 C)-99.8 F (37.7 C)] 99.7 F (37.6 C) (12/11 0600) Pulse Rate:  [90-102] 90  (12/11 0600) Resp:  [14-20] 14  (12/11 0600) BP: (140-163)/(76-85) 140/85 mmHg (12/11 0600) SpO2:  [97 %] 97 % (12/11 0600) Weight:  [163 lb 12.8 oz (74.3 kg)] 163 lb 12.8 oz (74.3 kg) (12/11 0500) Last BM Date: 06/06/11  Intake/Output from previous day: 12/10 0701 - 12/11 0700 In: 1150.8 [I.V.:950.8; IV Piggyback:200] Out: 3133 [Urine:3000; Drains:133] Intake/Output this shift:    PE: Abd: soft, NT, ND, +BS, g tube to drainage.  Wound is clean.  Lab Results:   Basename 06/09/11 0607 06/07/11 0500  WBC 9.3 7.6  HGB 9.9* 10.4*  HCT 30.6* 31.6*  PLT 345 284   BMET  Basename 06/09/11 0607 06/07/11 0500  NA 129* 131*  K 3.3* 3.6  CL 98 98  CO2 25 24  GLUCOSE 121* 135*  BUN 13 12  CREATININE 0.46* 0.46*  CALCIUM 8.5 9.1   PT/INR No results found for this basename: LABPROT:2,INR:2 in the last 72 hours   Studies/Results: Ct Abdomen Pelvis W Contrast  06/08/2011  *RADIOLOGY REPORT*  Clinical Data: Evaluate fluid collections, intra-abdominal abscess post percutaneous drainage  CT ABDOMEN AND PELVIS WITH CONTRAST  Technique:  Multidetector CT imaging of the abdomen and pelvis was performed following the standard protocol during bolus administration of intravenous contrast. Sagittal and coronal MPR images reconstructed from axial data set.  Contrast: 80mL OMNIPAQUE IOHEXOL 300 IV; Dilute contrast by G-tube.  Comparison: 05/29/2011, 05/31/2011  Findings: Small pericardial effusion. Minimal bibasilar effusions and atelectasis. Gallbladder surgically absent. Liver, spleen, and adrenal glands normal. Tiny bilateral renal cysts and probable tiny left renal calculi, nonobstructing.  Atrophic pancreas with parenchymal calcifications of body. Prior antrectomy with gastrojejunostomy and gastrostomy tube. Gastric remnant demonstrates a mildly thickened wall, nonspecific, question artifact underdistension versus edema. Postsurgical infiltrative changes in the upper abdomen.  Percutaneous drainage catheter identified adjacent to the lateral segment left lobe liver, associated fluid collection decreased in size, residual 3.8 x 1.5 cm. Additional fluid collection lateral to the stomach, 2.4 x 2.3 x 5.2 cm, previously 3.2 x 2.6 x 6.4 cm. Third fluid collection identified medial to the lateral segment left lobe liver, 3.3 x 2.7 cm, previously 4.2 x 3.7 cm at same level. No new intra abdominal fluid collections identified.  Beam hardening artifacts in pelvis from right hip prosthesis. Scattered diverticula descending and sigmoid colon. Foley catheter decompresses urinary bladder. Atrophic uterus with unremarkable ovaries and appendix. No mass, adenopathy, or free air. Long catheter within abdomen, question peritoneal dialysis catheter. Scattered atherosclerotic calcifications. Areas subcutaneous infiltration or nodularity in the left abdominal wall greater than right likely related to medication injection sites. Bones are diffusely demineralized.  IMPRESSION: Decrease in sizes of the upper abdominal fluid collections since previous exam.  Wall thickening of gastric remnant though this could be an artifact from underdistension. Small pericardial effusion. Descending and sigmoid colonic diverticulosis. Bibasilar atelectasis and minimal pleural effusions.  Original Report Authenticated By: Lollie Marrow, M.D.    Anti-infectives: Anti-infectives     Start     Dose/Rate Route Frequency Ordered Stop   06/03/11 2115   ciprofloxacin (CIPRO) IVPB 400 mg  Status:  Discontinued     Comments:  Please mix with NS.      400 mg 200 mL/hr over 60 Minutes Intravenous Every 12 hours 06/03/11 2114 06/03/11 2121    05/30/11 1130   ciprofloxacin (CIPRO) IVPB 400 mg  Status:  Discontinued        400 mg 200 mL/hr over 60 Minutes Intravenous 2 times daily 05/30/11 1038 06/02/11 1618   05/29/11 2100   imipenem-cilastatin (PRIMAXIN) 500 mg in sodium chloride 0.9 % 100 mL IVPB        500 mg 200 mL/hr over 30 Minutes Intravenous Every 8 hours 05/29/11 2007     05/29/11 2100   vancomycin (VANCOCIN) 1,250 mg in sodium chloride 0.9 % 250 mL IVPB  Status:  Discontinued        1,250 mg 166.7 mL/hr over 90 Minutes Intravenous Every 24 hours 05/29/11 2007 06/03/11 1006   05/07/11 1400   imipenem-cilastatin (PRIMAXIN) 500 mg in sodium chloride 0.9 % 100 mL IVPB        500 mg 200 mL/hr over 30 Minutes Intravenous 3 times per day 05/07/11 1106 05/21/11 1359   05/07/11 1200   micafungin (MYCAMINE) 100 mg in sodium chloride 0.9 % 100 mL IVPB  Status:  Discontinued     Comments: PER PHARMACY      100 mg 100 mL/hr over 1 Hours Intravenous Every 24 hours 05/07/11 1032 05/19/11 1103   05/07/11 1200   vancomycin (VANCOCIN) 1,250 mg in sodium chloride 0.9 % 250 mL IVPB  Status:  Discontinued        1,250 mg 166.7 mL/hr over 90 Minutes Intravenous Every 24 hours 05/07/11 1106 05/19/11 1103   05/07/11 1000   micafungin (MYCAMINE) 150 mg in sodium chloride 0.9 % 100 mL IVPB  Status:  Discontinued     Comments: PER PHARMACY      150 mg 100 mL/hr over 1 Hours Intravenous Daily 05/07/11 0951 05/07/11 1029   05/07/11 0300   ertapenem (INVANZ) 1 g in sodium chloride 0.9 % 50 mL IVPB        1 g 100 mL/hr over 30 Minutes Intravenous  Once 05/07/11 0300 05/07/11 0348   05/07/11 0130   cefTRIAXone (ROCEPHIN) 1 g in dextrose 5 % 50 mL IVPB        1 g 100 mL/hr over 30 Minutes Intravenous  Once 05/07/11 0122 05/07/11 0200           Assessment/Plan  1. 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 PCM/TNA  Plan: 1. Both intra-abd fluid collections have overall  decreased in size.  Ct scan appears improved 2. Cont TNA 3. Will clamp G tube today and allow clear liquids and see how she does.   LOS: 34 days    [name] E 06/09/2011

## 2011-06-09 NOTE — Progress Notes (Signed)
[name] KGS:[id number],VOP:[id number] is a 77 y.o. female,  Outpatient Primary MD for the patient is [name], MD, MD   Chief Complaint  Patient presents with  . Headache        Subjective:   [name] today has, No headache, No chest pain, No abdominal pain - No Nausea, No new weakness tingling or numbness, No Cough - SOB.     Objective:   Filed Vitals:   06/09/11 0500 06/09/11 0600 06/09/11 1400 06/09/11 1445  BP:  140/85 157/87 161/88  Pulse:  90  93  Temp:  99.7 F (37.6 C) 99.7 F (37.6 C) 98.7 F (37.1 C)  TempSrc:  Oral  Oral  Resp:  14 93 16  Height:      Weight: 74.3 kg (163 lb 12.8 oz)     SpO2:  97% 95% 97%     Wt Readings from Last 3 Encounters:  06/09/11 74.3 kg (163 lb 12.8 oz)  06/09/11 74.3 kg (163 lb 12.8 oz)  05/06/11 73.483 kg (162 lb)      Intake/Output Summary (Last 24 hours) at 06/09/11 1625 Last data filed at 06/09/11 1200  Gross per 24 hour  Intake 1150.83 ml  Output   3533 ml  Net -2382.17 ml    Exam Awake Alert, Oriented *3, No new F.N deficits, Normal affect [place].AT,PERRAL Supple Neck,No JVD, No cervical lymphadenopathy appriciated.  Symmetrical Chest wall movement, Good air movement bilaterally, CTAB RRR,No Gallops,Rubs or new Murmurs, No Parasternal Heave +ve B.Sounds, Abd Soft, Non tender, No organomegaly appriciated, No rebound -guarding or rigidity. JP Abd drain in place. No Cyanosis, Clubbing or edema, No new Rash or bruise     Data Review  CBC  Lab 06/09/11 0607 06/07/11 0500 06/05/11 0710 06/04/11 0600 06/03/11 0615  WBC 9.3 7.6 7.4 8.4 9.3  HGB 9.9* 10.4* 10.3* 10.2* 10.3*  HCT 30.6* 31.6* 31.8* 31.7* 30.9*  PLT 345 284 365 366 371  MCV 84.5 84.3 83.9 83.9 83.3  MCH 27.3 27.7 27.2 27.0 27.8  MCHC 32.4 32.9 32.4 32.2 33.3  RDW 18.2* 18.4* 18.5* 18.7* 18.5*  LYMPHSABS -- -- -- -- --  MONOABS -- -- -- -- --  EOSABS -- -- -- -- --  BASOSABS -- -- -- -- --  BANDABS -- -- -- -- --    Chemistries   Lab  06/09/11 0607 06/07/11 0500 06/06/11 0858 06/05/11 0710 06/04/11 0600  NA 129* 131* 128* 130* 129*  K 3.3* 3.6 3.7 3.6 3.5  CL 98 98 95* 96 97  CO2 25 24 24 25 26   GLUCOSE 121* 135* 143* 125* 128*  BUN 13 12 14 13 12   CREATININE 0.46* 0.46* 0.40* 0.41* 0.45*  CALCIUM 8.5 9.1 8.8 8.4 8.5  MG 1.4* -- 1.5 1.4* --  AST 12 -- -- 13 --  ALT 6 -- -- 8 --  ALKPHOS 87 -- -- 96 --  BILITOT 0.2* -- -- 0.2* --   ------------------------------------------------------------------------------------------------------------------ estimated creatinine clearance is 60.7 ml/min (by C-G formula based on Cr of 0.46).  Cardiac Enzymes No results found for this basename: CK:3,CKMB:3,TROPONINI:3,MYOGLOBIN:3 in the last 168 hours ------------------------------------------------------------------------------------------------------------------ No components found with this basename: POCBNP:3  Micro Results Recent Results (from the past 240 hour(s))  CULTURE, ROUTINE-ABSCESS     Status: Normal   Collection Time   05/31/11  1:29 PM      Component Value Range Status Comment   Specimen Description ABSCESS ABDOMEN RUQ   Final    Special Requests  PATIENT ON FOLLOWING VANC, CIRPO, PRIMAXIN   Final    Gram Stain     Final    Value: NO WBC SEEN     NO SQUAMOUS EPITHELIAL CELLS SEEN     NO ORGANISMS SEEN   Culture NO GROWTH 3 DAYS   Final    Report Status 06/03/2011 FINAL   Final     Radiology Reports Ct Guided Abscess Drain  05/31/2011  *RADIOLOGY REPORT*  Clinical Data: Intra-abdominal sub xyphoid abscess postoperatively  CT GUIDED INTRA-ABDOMINAL ABSCESS DRAINAGE  Date:  05/31/2011 11:23:00  Radiologist:  M. Ruel Favors, M.D.  Medications:  2 mg Versed, 100 mcg Fentanyl  Guidance:  CT  Fluoroscopy time:  None.  Sedation time:  18 minutes  Contrast volume:  None.  Complications:  No immediate  PROCEDURE/FINDINGS:  Informed consent was obtained from the patient following explanation of the procedure, risks,  benefits and alternatives. The patient understands, agrees and consents for the procedure. All questions were addressed.  A time out was performed.  Maximal barrier sterile technique utilized including caps, mask, sterile gowns, sterile gloves, large sterile drape, hand hygiene, and betadine  Previous imaging was reviewed.  The midline sub xyphoid intra- abdominal abscess was localized with the patient supine utilizing noncontrast CT.  Under sterile conditions and local anesthesia, an 18 gauge 10 cm access needle was advanced from an anterior oblique approach into the fluid collection.  Syringe aspiration yielded purulent fluid consistent with an abscess.  Guide wire advanced. Guide wire position confirmed with CT.  Tract dilatation performed to advance a 12-French drain with the retention loop formed in the cavity.  Syringe aspiration yielded 20 ml of thick purulent fluid consistent with an abscess.  Sample sent for Gram stain and culture.  Catheter secured with a Prolene suture and connected to external suction bulb.  Sterile dressing applied.  IMPRESSION: Successful CT guided sub xyphoid intra-abdominal abscess drain placement.  Original Report Authenticated By: Judie Petit. Ruel Favors, M.D.   Ct Abdomen Pelvis W Contrast  05/30/2011  *RADIOLOGY REPORT*  Clinical Data: Recent exploratory laparotomy, large pyloric ulcer with perforated.  CT ABDOMEN AND PELVIS WITH CONTRAST  Technique:  Multidetector CT imaging of the abdomen and pelvis was performed following the standard protocol during bolus administration of intravenous contrast.  Contrast: OMNIPAQUE IOHEXOL 300 MG/ML IV SOLN  Comparison: CT 05/07/2011  Findings: There is a gastrostomy tube within the stomach. Postsurgical change consistent with partial gastrectomy.  There is a percutaneous drain in the right abdominal wall which extends to the left upper quadrant inferior to the stomach.  A second surgical drain extends inferiorly and superiorly with tip in  the left upper quadrant adjacent to the spleen.  There is a small fluid collection adjacent to the left lateral margin of the liver which measures 7.1 x 4.0 cm.  This is adjacent the left lesser curvature of the stomach and does have a thin rim of enhancement.(image 26).  This is similar to prior the collection measures 6.4 x 4.9 cm.  There is a 3.7 x 4.2 cm collection which has a small amount interspersed gas at the surgical site along the left hepatic margin.  This is decreased from 4.5 x 4.6 cm on prior. There is additional fluid collection lateral to the stomach measuring 2.5 x 3.2 cm (image 31) which is increased from 1.7 x 3.0 cm. There is a tiny 6 mm focus of gas, lateral to the duodenum,  which is clearly seen on prior (image  34).  There is mild bibasilar atelectasis and small effusions.  No pericardial fluid.  No focal hepatic lesion.  The gallbladder is absent.  Pancreas is normal.  The spleen is normal.  Adrenal glands and kidneys are normal.  No evidence of bowel obstruction.  There is diverticula the sigmoid colon.  Abdominal aorta normal caliber.  No free fluid the pelvis.  Foley catheter in the bladder.  Uterus is normal. Review of  bone windows demonstrates no aggressive osseous lesions.  IMPRESSION:  1.  Fluid collection with interspersed gas along the surgical site posterior to the left hepatic lobe is decreased slightly in size.  2.  Fluid collection lateral to the left hepatic lobe is not changed. 3.  Fluid collection lateral to the stomach is slightly increased in size.  4.  Tiny focus of gas and at the porta hepatis is not clearly seen on prior.  Original Report Authenticated By: Genevive Bi, M.D.   Ct Abdomen Pelvis W Contrast  05/18/2011  *RADIOLOGY REPORT*  Clinical Data: Recent of pill Roth to surgery.  Intra-abdominal abscess.  CT ABDOMEN AND PELVIS WITH CONTRAST  Technique:  Multidetector CT imaging of the abdomen and pelvis was performed following the standard protocol during  bolus administration of intravenous contrast.  Contrast: OMNIPAQUE IOHEXOL 300 MG/ML IV SOLN  Comparison: 05/07/2011  Findings: Small bilateral pleural effusions with overlying atelectasis noted.  No pericardial effusion.  No focal liver abnormality noted.  No splenic abnormalities identified.  The adrenal glands both appear within normal limits.  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  A cyst is noted within the upper pole of the left kidney measuring 0.6 cm.  There is no upper abdominal adenopathy.  No pelvic adenopathy noted.  The urinary bladder is collapsed around a Foley catheter balloon.  Within the upper abdomen there are postoperative changes consistent with the overall to surgery. A percutaneous gastrostomy tube is in place.  Additionally there is a left abdominal percutaneous jejunostomy tube.  A surgical drainage catheter is identified which enters the right abdomen and crosses the midline to the left upper quadrant of the abdomen.  There has been cholecystectomy.  The proximal small bowel loops are increased in caliber measuring up to 4.9 cm.  There is enteric contrast material identified within the colon up to the level of the splenic flexure.  There is a moderate amount of fluid identified within the upper abdomen.  At this time there is no mature, enhancing wall surrounding the upper abdominal fluid to suggest abscess.  Review of the visualized osseous structures is significant for lumbar degenerative disc disease and right total hip arthroplasty device.  IMPRESSION:  1.  No discrete peripherally enhancing fluid collections within the upper abdomen to suggest abscess.  There is a moderate amount of fluid within the upper abdomen which is nonspecific in the early postoperative period. 2.  Postop changes consistent with Billroth II gastric surgery. There is nonspecific increased caliber of the proximal small bowel which may represent postoperative ileus. 3.   Cholecystectomy.  Original Report Authenticated By: Rosealee Albee, M.D.   Dg Chest Port 1 View  05/29/2011  *RADIOLOGY REPORT*  Clinical Data: Febrile.  Low oxygen saturations.  PORTABLE CHEST - 1 VIEW  Comparison: Chest radiograph 05/19/2011  Findings: Right upper extremity PICC terminates in the distal superior vena cava.  Heart size within normal limits portable technique.  Mediastinal contour stable.  Lung volumes are low bilaterally with  linear atelectasis at the right lung base.  There is peribronchial thickening.  No visible airspace disease or pleural effusion.  Osteopenia.  IMPRESSION: Low lung volumes with right basilar atelectasis and peribronchial thickening.  Original Report Authenticated By: Britta Mccreedy, M.D.   Dg Chest Port 1 View  05/19/2011  *RADIOLOGY REPORT*  Clinical Data: Check endotracheal tube  PORTABLE CHEST - 1 VIEW  Comparison: 05/17/2011  Findings: Cardiomediastinal silhouette is stable.  Endotracheal tube has been removed.  No diagnostic pneumothorax.  Mild basilar atelectasis or infiltrate.  Stable right arm PICC line position. No pulmonary edema.  IMPRESSION: Endotracheal tube has been removed.  No diagnostic pneumothorax. Mild basilar atelectasis or infiltrate.  Stable right arm PICC line position. No pulmonary edema.  Original Report Authenticated By: Natasha Mead, M.D.   Dg Chest Port 1 View  05/17/2011  *RADIOLOGY REPORT*  Clinical Data: ET tube placement  PORTABLE CHEST - 1 VIEW  Comparison: 05/16/2011  Findings: Endotracheal tube terminates 2.5 cm above the carina.  Low lung volumes with bibasilar atelectasis and small left pleural effusion. No pneumothorax.  IMPRESSION: Endotracheal tube terminates 2.5 cm above the carina.  Low lung volumes with bibasilar atelectasis and small left pleural effusion.  Original Report Authenticated By: Charline Bills, M.D.   Dg Chest Port 1 View  05/16/2011  *RADIOLOGY REPORT*  Clinical Data: ET tube placement.  PORTABLE CHEST -  1 VIEW  Comparison: Chest 05/14/2011.  Findings: Endotracheal tube is in place with tip 2 cm above the carina.  Left IJ catheter has been removed.  There is a new right PICC with the tip in the lower superior vena cava.  Left worse than right basilar airspace disease persists but appears improved somewhat.  No pneumothorax is identified.  Heart size is normal.  IMPRESSION:  1.  ET tube tip is 2 cm above the carina. 2.  New right PICC in good position. 3.  Some improvement in bibasilar airspace opacities.  Original Report Authenticated By: Bernadene Bell. Maricela Curet, M.D.   Dg Chest Port 1 View  05/14/2011  *RADIOLOGY REPORT*  Clinical Data: Evaluate ET tube placement  PORTABLE CHEST - 1 VIEW  Comparison: 05/13/2011  Findings: The endotracheal tube tip is above the carina.  There is a left IJ catheter with tip in the SVC.  Stable cardiac enlargement.  Lung volumes are low and there is interstitial edema bilaterally. Pleural effusions are also noted.  IMPRESSION:  1.  Low lung volumes and congestive heart failure. 2.  Stable position of ET tube.  Original Report Authenticated By: Rosealee Albee, M.D.   Dg Chest Port 1 View  05/13/2011  *RADIOLOGY REPORT*  Clinical Data: Respiratory failure.  PORTABLE CHEST - 1 VIEW  Comparison: 05/12/2011  Findings: Endotracheal tube and left jugular vein catheter appear in good position, unchanged.  Bibasilar atelectasis and possible left effusion are essentially unchanged.  IMPRESSION: No significant change.  Bibasilar atelectasis and possible left effusion.  Original Report Authenticated By: Gwynn Burly, M.D.   Dg Chest Portable 1 View  05/12/2011  *RADIOLOGY REPORT*  Clinical Data: Endotracheal tube placement  PORTABLE CHEST - 1 VIEW  Comparison: May 11, 2011  Findings: Endotracheal tube is in grossly good position with tip several centimeters above the carina.  No change is noted in position of left internal jugular catheter line.  Increased left lower lobe  opacity is noted most consistent with pleural effusion although underlying pneumonia or atelectasis cannot be excluded.  IMPRESSION: Worsening left lower lobe opacity  most consistent with pleural effusion and possible underlying pneumonia or atelectasis.  Original Report Authenticated By: Venita Sheffield., M.D.   Dg Chest Portable 1 View  05/11/2011  *RADIOLOGY REPORT*  Clinical Data: ET tube placement.  PORTABLE CHEST - 1 VIEW  Comparison: Portable chest 05/09/2011.  Findings: The patient's endotracheal tube has been pulled back with the tip now in good position, 3 cm above the carina.  Support apparatus is otherwise unchanged with fatty left chest tube noted. There is no pneumothorax.  Left effusion and basilar airspace disease have improved.  Mild atelectasis in the right base is noted.  Cardiomegaly.  IMPRESSION:  1.  ET tube in good position. 2.  Decreased left effusion and basilar airspace disease.  Original Report Authenticated By: Bernadene Bell. D'ALESSIO, M.D.   Dg Chest Portable 1 View  05/09/2011  *RADIOLOGY REPORT*  Clinical Data: Repositioned endotracheal tube  PORTABLE CHEST - 1 VIEW  Comparison: 05/09/2011; 05/07/2011  Findings: Unchanged enlarged cardiac silhouette and mediastinal contours.  No significant interval change in endotracheal tube positioning with tip remains approximately 1 cm from the carina. Grossly unchanged position of left jugular approach and venous catheter tip overlying the superior cavoatrial junction.  No changed minimally improved aeration of the right lung base. The pulmonary vasculature remains indistinct.  Grossly unchanged hazy left basilar opacity likely representing a layering pleural effusion.  Grossly unchanged left basilar heterogeneous / consolidative opacities.  No definite pneumothorax.  Unchanged bones.  IMPRESSION: 1.  No interval change in endotracheal tube positioning with tip again approximate 1 cm from the carina.  Otherwise, unchanged support apparatus.   No definite pneumothorax. 2.  Minimally improved aeration of the right lung base with persistent findings of mild pulmonary edema and layering left sided effusion and left basilar heterogeneous / consolidative opacities, possibly atelectasis though underlying infection is not excluded.  Original Report Authenticated By: Waynard Reeds, M.D.   Dg Chest Portable 1 View  05/09/2011  *RADIOLOGY REPORT*  Clinical Data: Follow up respiratory failure  PORTABLE CHEST - 1 VIEW  Comparison: 05/07/2011  Findings: ET tube remains low at the right mainstem bronchus orientation.  Recommend pulling back by 2-3 cm.  Left IJ catheter tip is in the SVC.  Interval increase in bilateral pleural effusions and interstitial edema.  IMPRESSION:  1.  Increase in CHF pattern. 2.  ET tube remains too low.  Original Report Authenticated By: Rosealee Albee, M.D.   Dg Abd Portable 1v  05/17/2011  *RADIOLOGY REPORT*  Clinical Data: Abdominal distention  ABDOMEN - 1 VIEW  Comparison: 05/12/2011  Findings: Drains are again noted over the abdomen.  Minimal retained contrast noted in nondilated cecum.  Clips overlie the left of midline. Presence or absence of air fluid levels or free air cannot be assessed on this single supine view.  No gas filled dilated loop of bowel is identified.  There is minimal possible free air beneath the left hemidiaphragm as seen on prior exams. Right total hip arthroplasty again noted.  IMPRESSION: No supine evidence for gaseous bowel distention.  Original Report Authenticated By: Harrel Lemon, M.D.   Dg Abd Portable 1v  05/12/2011  *RADIOLOGY REPORT*  Clinical Data: Abdominal distention  ABDOMEN - 1 VIEW  Comparison: 05/07/2011  Findings: There is a gastrostomy tube identified within the left upper quadrant of the abdomen.  The surgical drainage catheters are noted with tips in the projection of the left upper quadrant the abdomen.  The bowel gas pattern  appears nonobstructed.  No dilated loops of  small bowel or air-fluid levels.  IMPRESSION:  1.  There are no specific features identified to suggest bowel obstruction.  Original Report Authenticated By: Rosealee Albee, M.D.   Dg [name] W/water Sol Cm  05/25/2011  *RADIOLOGY REPORT*  Clinical Data:Bilious drainage per G tube status post gastric antrectomy and partial duodenectomy with Billroth II anastomosis for perforated ulcer 05/07/2011.  Evaluate patency of anastomosis.  WATER SOLUBLE UPPER GI SERIES  Technique:  Single-column upper GI series was performed using water soluble contrast.  Fluoroscopy Time: 1.46 minutes.  Contrast: 120 ml Omnipaque-300 per percutaneous gastrostomy tube.  Comparison: CT 05/18/2011.  Findings: The scout abdominal radiograph demonstrates a percutaneous gastrostomy and jejunostomy.  There is a small amount of residual enteric contrast in the colon.  Water-soluble contrast administered per the G tube demonstrates it to be well positioned with rapid filling of the proximal stomach.  There is rapid emptying into what was presumed to represent the efferent loop.  Small bowel contrast appears to rapidly extend into the right lower quadrant and fill the lumen of the cecum. The segment of the small bowel between the stomach and cecum appears to be significantly shorter than that suggested on CT, implying possible development of a fistulous communication.  No extravasation is identified.  No opacification of the afferent loop was achieved.  There was significant reflux into the esophagus to the level of the cricopharyngeus while turning the patient.  IMPRESSION:  1.  Percutaneous gastrostomy tube appears well positioned. There is no extravasation. 2.  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  As correlated with the recent CT, the efferent loop is not clearly opacified but did appear to be patent on CT.  Follow-up CT  may be helpful to better define this anatomy. 3.  Significant gastroesophageal reflux.  Original Report Authenticated By: Gerrianne Scale, M.D.    Scheduled Meds:    . cloNIDine  0.1 mg Transdermal Weekly  . heparin subcutaneous  5,000 Units Subcutaneous Q8H  . imipenem-cilastatin  500 mg Intravenous Q8H  . lip balm  1 application Topical BID  . magnesium sulfate 1 - 4 g bolus IVPB  2 g Intravenous Once  . metoprolol  5 mg Intravenous Q12H  . pantoprazole (PROTONIX) IV  40 mg Intravenous Q12H  . potassium chloride  10 mEq Intravenous Q1 Hr x 3  . potassium chloride  10 mEq Intravenous Q1 Hr x 2   Continuous Infusions:    . sodium chloride 50 mL/hr at 06/07/11 0545  . fat emulsion 240 mL (06/09/11 0634)  . TPN (CLINIMIX) +/- additives 80 mL/hr at 06/07/11 1745  . TPN (CLINIMIX) +/- additives    . TPN (CLINIMIX) +/- additives 80 mL/hr at 06/09/11 0634   PRN Meds:.acetaminophen, hydrALAZINE, iohexol, LORazepam, morphine injection, phenol, sodium chloride  Assessment & Plan   1-Abdominal pain, acute (05/07/2011) - Secondary to recent bowel perforation and s/p surgery : Exp. Lap, lysis of adhesions, antrectomy,proximal and duodenal resection, bilroth II, gastrojejunostomy anastomosis, Omental patch of duodenal bulb, gastrostomy, jejunostomy. 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 Surg and IR following, repeat CT soon, D/W ID Dr Orvan Falconer on 06-07-11 4pm - continue Primaxin  for 3 weeks from 05-31-11 and then based on the clinical picture. Continue TPN , Bowel rest with clears. Pain better today, repeat CT shows improved fluid collection, Surg following.  Culture Abscess no growth.   Patient on imipenem.  Imipenem started Nov 8 and stopped nov 22. Then restarted Nov 30.    2-Sepsis (05/07/2011), Erosive  esophagitis and acute abdomen causing Acute respiratory failure (05/12/2011) Resolved.  ETT 11/8>>> 11/18.  Due to #1 above Sepsis and Resp failure resolved. On RA.   3-DIABETES MELLITUS, TYPE II (09/13/2007) SSI.  CBG (last 3)   Basename 06/09/11 1114 06/09/11 0649 06/09/11 0352  GLUCAP 117* 130* 133*    4-HYPERTENSION (09/13/2007) Hydralazine PRN. Added scheduled catapres patch as BP running high. B Blocker IV continue.  Filed Vitals:   06/09/11 0500 06/09/11 0600 06/09/11 1400 06/09/11 1445  BP:  140/85 157/87 161/88  Pulse:  90  93  Temp:  99.7 F (37.6 C) 99.7 F (37.6 C) 98.7 F (37.1 C)  TempSrc:  Oral  Oral  Resp:  14 93 16  Height:      Weight: 74.3 kg (163 lb 12.8 oz)     SpO2:  97% 95% 97%    5-Anemia (01/26/2011)- Stable.   6-Hyponatremia (01/28/2011) NS with TNA - Na better, check OSM slightly on the lower side, pharm informed ? adjust TNA formulation.Marland Kitchen    7-UTI : urine with E coli and cipro. Ua 12-01. Suspect imipenem will cover both. Clinically resolved.  8-Hypokalemia: Repleted IV, monitor.   Disposition: Patient will likely benefit from long term facility  DVT Prophylaxis  Heparin   See all Orders from today for further details   Leroy Sea M.D 06/09/2011, 4:25 PM  [facility] Office  [phone]

## 2011-06-09 NOTE — Progress Notes (Signed)
PARENTERAL NUTRITION CONSULT NOTE - FOLLOW UP Pharmacy Consult for TPN  Indication:  Not tolerating TF. s/p gastrectomy, gastrostomy, cholecysectomy for erosive esophagitis and perforated ulcer. Intraabdominal abscess with possible fistula (seen on UGI).  Labs:  [facility] 06/09/11 0607 06/07/11 0500  WBC 9.3 7.6  HGB 9.9* 10.4*  HCT 30.6* 31.6*  PLT 345 284  APTT -- --  INR -- --     Basename 06/09/11 0607 06/07/11 0500  NA 129* 131*  K 3.3* 3.6  CL 98 98  CO2 25 24  GLUCOSE 121* 135*  BUN 13 12  CREATININE 0.46* 0.46*  LABCREA -- --  CREAT24HRUR -- --  CALCIUM 8.5 9.1  MG 1.4* --  PHOS -- --  PROT 6.2 --  ALBUMIN 1.9* --  AST 12 --  ALT 6 --  ALKPHOS 87 --  BILITOT 0.2* --  BILIDIR -- --  IBILI -- --  PREALBUMIN -- --  CHOLHDL -- --  CHOL 128 --   Estimated Creatinine Clearance: 60.7 ml/min (by C-G formula based on Cr of 0.46).    Basename 06/09/11 0649 06/09/11 0352 06/08/11 2356  GLUCAP 130* 133* 146*     Insulin Requirements in the past 24 hours:  50 units regular insulin in TPN  Assessment:  77 y/o previously on TPN (for erosive esophagitis with perforated pyloric ulcer s/p lap. 11/7 with gastrostomy, gastrectomy and cholecystectomy) TPN initially started 05/12/11 - 05/14/2011. 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 TPN changed to continuous 24h infusion d/t low Na.  Noted plan to continue clear liquid diet and clamp NGT, and if patient does well, will start weaning TPN.  1. Endo - CBGs within goal with 50 units insulin in TPN. 2. Lytes - persistent and asymptomatic hyponatremia, hypokalemia, hypomagnesemia 3. Renal fxn stable, good UOP 4.  Prealbumin pending, TC/TG WNL   Nutritional Goals:  1785-2000 kcal and 95-110 g protein   Plan:  1.  Continue Clinimix E 5/15 at 21ml/hr, no lipids today. 2.  Supplement lytes:  KCl x 3 runs, Mag 2gm IV x 1.  Monitor  Na+ 3. Due to national backorder, lipids, MVI, and trace elements will be given MWF only.  4. F/U AM labs and diet advancement.    [name] 06/09/2011,9:56 AM

## 2011-06-10 LAB — GLUCOSE, CAPILLARY
Glucose-Capillary: 118 mg/dL — ABNORMAL HIGH (ref 70–99)
Glucose-Capillary: 130 mg/dL — ABNORMAL HIGH (ref 70–99)
Glucose-Capillary: 132 mg/dL — ABNORMAL HIGH (ref 70–99)
Glucose-Capillary: 134 mg/dL — ABNORMAL HIGH (ref 70–99)
Glucose-Capillary: 161 mg/dL — ABNORMAL HIGH (ref 70–99)

## 2011-06-10 LAB — COMPREHENSIVE METABOLIC PANEL
ALT: 6 U/L (ref 0–35)
AST: 13 U/L (ref 0–37)
Albumin: 1.9 g/dL — ABNORMAL LOW (ref 3.5–5.2)
CO2: 23 mEq/L (ref 19–32)
Chloride: 99 mEq/L (ref 96–112)
Creatinine, Ser: 0.45 mg/dL — ABNORMAL LOW (ref 0.50–1.10)
Sodium: 131 mEq/L — ABNORMAL LOW (ref 135–145)
Total Bilirubin: 0.3 mg/dL (ref 0.3–1.2)

## 2011-06-10 MED ORDER — FAT EMULSION 20 % IV EMUL
240.0000 mL | INTRAVENOUS | Status: AC
  Administered 2011-06-10: 240 mL via INTRAVENOUS
  Filled 2011-06-10: qty 250

## 2011-06-10 MED ORDER — TRACE MINERALS CR-CU-MN-SE-ZN 10-1000-500-60 MCG/ML IV SOLN
INTRAVENOUS | Status: AC
  Administered 2011-06-10: 18:00:00 via INTRAVENOUS
  Filled 2011-06-10: qty 2000

## 2011-06-10 MED ORDER — BOOST / RESOURCE BREEZE PO LIQD
1.0000 | Freq: Three times a day (TID) | ORAL | Status: DC
  Administered 2011-06-10 – 2011-07-06 (×45): 1 via ORAL

## 2011-06-10 NOTE — Progress Notes (Signed)
PARENTERAL NUTRITION CONSULT NOTE - FOLLOW UP Pharmacy Consult for TPN  Indication:  Not tolerating TF. s/p gastrectomy, gastrostomy, cholecysectomy for erosive esophagitis and perforated ulcer. Intraabdominal abscess with possible fistula (seen on UGI).  Labs:  [facility] 06/09/11 0607  WBC 9.3  HGB 9.9*  HCT 30.6*  PLT 345  APTT --  INR --     Basename 06/10/11 0500 06/09/11 0607  NA 131* 129*  K 3.7 3.3*  CL 99 98  CO2 23 25  GLUCOSE 136* 121*  BUN 14 13  CREATININE 0.45* 0.46*  LABCREA -- --  CREAT24HRUR -- --  CALCIUM 8.6 8.5  MG 1.7 1.4*  PHOS -- --  PROT 6.1 6.2  ALBUMIN 1.9* 1.9*  AST 13 12  ALT 6 6  ALKPHOS 84 87  BILITOT 0.3 0.2*  BILIDIR -- --  IBILI -- --  PREALBUMIN -- 12.0*  CHOLHDL -- --  CHOL -- 128   Estimated Creatinine Clearance: 60.8 ml/min (by C-G formula based on Cr of 0.45).    Basename 06/10/11 0815 06/10/11 0349 06/09/11 2337  GLUCAP 118* 134* 130*     Insulin Requirements in the past 24 hours:  50 units regular insulin in TPN  Assessment:  77 y/o previously on TPN (for erosive esophagitis with perforated pyloric ulcer s/p lap. 11/7 with gastrostomy, gastrectomy and cholecystectomy) TPN initially started 05/12/11 - 05/14/2011. 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 TPN changed to continuous 24h infusion d/t low Na.  S/p perc drain and patient to continue on a clear liquid diet and TPN.  1. Endo - CBGs within goal with 50 units insulin in TPN. 2. Lytes - hyponatremia improving, others WNL 3. Renal fxn stable, good UOP 4.  Prealbumin low but trending up 5.  LFTs, tbili WNL   Nutritional Goals:  1785-2000 kcal and 95-110 g protein   Plan:  1.  Continue Clinimix E 5/15 at 8ml/hr and add lipids today. 2. Due to national backorder, lipids, MVI, and trace elements will be given MWF only.  3.  Monitor albumin trend 4. F/U diet advancement  to start weaning TPN.    [name] 06/10/2011,9:08 AM

## 2011-06-10 NOTE — Progress Notes (Addendum)
Nutrition Follow-up  Patient is receiving TPN with Clinimix E 5/15 @ 80 ml/hr.  Lipids (20% IVFE @ 10 ml/hr), multivitamins, and trace elements are provided 3 times weekly (MWF) due to national backorder.  Provides 1,568 kcal and 96 grams protein daily (based on weekly average).  Meets 88% minimum estimated kcal and 100% minimum estimated protein needs.  Diet Order:  Clear Liquids  Meds: Scheduled Meds:   . cloNIDine  0.1 mg Transdermal Weekly  . heparin subcutaneous  5,000 Units Subcutaneous Q8H  . imipenem-cilastatin  500 mg Intravenous Q8H  . lip balm  1 application Topical BID  . magnesium sulfate 1 - 4 g bolus IVPB  2 g Intravenous Once  . metoprolol  5 mg Intravenous Q12H  . pantoprazole (PROTONIX) IV  40 mg Intravenous Q12H  . potassium chloride  10 mEq Intravenous Q1 Hr x 3  . DISCONTD: potassium chloride  10 mEq Intravenous Q1 Hr x 2   Continuous Infusions:   . sodium chloride 50 mL/hr at 06/10/11 0510  . fat emulsion 240 mL (06/09/11 0634)  . fat emulsion    . TPN (CLINIMIX) +/- additives 80 mL/hr at 06/10/11 0510  . TPN (CLINIMIX) +/- additives 80 mL/hr at 06/09/11 0634  . TPN (CLINIMIX) +/- additives     PRN Meds:.acetaminophen, hydrALAZINE, LORazepam, morphine injection, phenol, sodium chloride  Labs:  CMP     Component Value Date/Time   NA 131* 06/10/2011 0500   K 3.7 06/10/2011 0500   CL 99 06/10/2011 0500   CO2 23 06/10/2011 0500   GLUCOSE 136* 06/10/2011 0500   BUN 14 06/10/2011 0500   CREATININE 0.45* 06/10/2011 0500   CALCIUM 8.6 06/10/2011 0500   PROT 6.1 06/10/2011 0500   ALBUMIN 1.9* 06/10/2011 0500   AST 13 06/10/2011 0500   ALT 6 06/10/2011 0500   ALKPHOS 84 06/10/2011 0500   BILITOT 0.3 06/10/2011 0500   GFRNONAA >90 06/10/2011 0500   GFRAA >90 06/10/2011 0500     Intake/Output Summary (Last 24 hours) at 06/10/11 1105 Last data filed at 06/10/11 0900  Gross per 24 hour  Intake 5410.83 ml  Output   2430 ml  Net 2980.83 ml    Weight  Status:  74.6 kg (12/12) -- down likely due to fluid  Nutrition Dx:  Inadequate Oral Intake, ongoing  Goal:  TPN to meet 100% of minimum estimated protein needs, maximize energy provision as able during national lipid backorder, met Monitor: PO intake, labs, weight, I/O's  Intervention:    TPN per pharmacy  Resource Breeze supplement PO TID  RD to follow for nutrition care plan   Alger Memos Pager #:  [phone]

## 2011-06-10 NOTE — Progress Notes (Signed)
Patient ID: [name], female   DOB: [birth year], 77 y.o.   MRN: [id number] Subjective: No events overnight. Patient reports abdominal pain.  Objective:  Vital signs in last 24 hours:  Filed Vitals:   06/09/11 1445 06/09/11 2205 06/10/11 0533 06/10/11 1508  BP: 161/88 127/78 138/79 130/77  Pulse: 93 82 92 87  Temp: 98.7 F (37.1 C) 99.4 F (37.4 C) 100.1 F (37.8 C) 99.2 F (37.3 C)  TempSrc: Oral Oral Oral Oral  Resp: 16 18 22 20   Height:      Weight:   74.6 kg (164 lb 7.4 oz)   SpO2: 97% 97% 95% 95%    Intake/Output from previous day:   Intake/Output Summary (Last 24 hours) at 06/10/11 1609 Last data filed at 06/10/11 1500  Gross per 24 hour  Intake 5410.83 ml  Output   2580 ml  Net 2830.83 ml    Physical Exam: General: Alert, awake, oriented, in mild distress due to pain HEENT: No bruits, no goiter. Moist mucous membranes, no scleral icterus, no conjunctival pallor. Heart: Regular rate and rhythm, without murmurs, rubs, gallops. Lungs: Clear to auscultation bilaterally. No wheezing, no rhonchi, no rales.  Abdomen: Soft, tender to palpation, nondistended, positive bowel sounds. Extremities: No clubbing cyanosis or edema,  positive pedal pulses. Neuro: Grossly intact, nonfocal.    Lab Results:  Basic Metabolic Panel:    Component Value Date/Time   NA 131* 06/10/2011 0500   K 3.7 06/10/2011 0500   CL 99 06/10/2011 0500   CO2 23 06/10/2011 0500   BUN 14 06/10/2011 0500   CREATININE 0.45* 06/10/2011 0500   GLUCOSE 136* 06/10/2011 0500   CALCIUM 8.6 06/10/2011 0500   CBC:    Component Value Date/Time   WBC 9.3 06/09/2011 0607   HGB 9.9* 06/09/2011 0607   HCT 30.6* 06/09/2011 0607   PLT 345 06/09/2011 0607   MCV 84.5 06/09/2011 0607   NEUTROABS 5.4 06/01/2011 1110   LYMPHSABS 1.0 06/01/2011 1110   MONOABS 0.5 06/01/2011 1110   EOSABS 0.4 06/01/2011 1110   BASOSABS 0.0 06/01/2011 1110      Lab 06/09/11 0607 06/07/11 0500 06/05/11 0710 06/04/11 0600    WBC 9.3 7.6 7.4 8.4  HGB 9.9* 10.4* 10.3* 10.2*  HCT 30.6* 31.6* 31.8* 31.7*  PLT 345 284 365 366  MCV 84.5 84.3 83.9 83.9  MCH 27.3 27.7 27.2 27.0  MCHC 32.4 32.9 32.4 32.2  RDW 18.2* 18.4* 18.5* 18.7*  LYMPHSABS -- -- -- --  MONOABS -- -- -- --  EOSABS -- -- -- --  BASOSABS -- -- -- --  BANDABS -- -- -- --    Lab 06/10/11 0500 06/09/11 0607 06/07/11 0500 06/06/11 0858 06/05/11 0710  NA 131* 129* 131* 128* 130*  K 3.7 3.3* 3.6 3.7 3.6  CL 99 98 98 95* 96  CO2 23 25 24 24 25   GLUCOSE 136* 121* 135* 143* 125*  BUN 14 13 12 14 13   CREATININE 0.45* 0.46* 0.46* 0.40* 0.41*  CALCIUM 8.6 8.5 9.1 8.8 8.4  MG 1.7 1.4* -- 1.5 1.4*   No results found for this basename: INR:5,PROTIME:5 in the last 168 hours Cardiac markers: No results found for this basename: CK:3,CKMB:3,TROPONINI:3,MYOGLOBIN:3 in the last 168 hours No components found with this basename: POCBNP:3 No results found for this or any previous visit (from the past 240 hour(s)).  Studies/Results: Ct Abdomen Pelvis W Contrast  06/08/2011 IMPRESSION: Decrease in sizes of the upper abdominal fluid collections since previous exam.  Wall thickening of gastric remnant though this could be an artifact from underdistension. Small pericardial effusion. Descending and sigmoid colonic diverticulosis. Bibasilar atelectasis and minimal pleural effusions.     Medications: Scheduled Meds:   . cloNIDine  0.1 mg Transdermal Weekly  . feeding supplement  1 Container Oral TID WC  . heparin subcutaneous  5,000 Units Subcutaneous Q8H  . imipenem-cilastatin  500 mg Intravenous Q8H  . lip balm  1 application Topical BID  . metoprolol  5 mg Intravenous Q12H  . pantoprazole (PROTONIX) IV  40 mg Intravenous Q12H  . DISCONTD: potassium chloride  10 mEq Intravenous Q1 Hr x 2   Continuous Infusions:   . sodium chloride 50 mL/hr at 06/10/11 0510  . fat emulsion 240 mL (06/09/11 0634)  . fat emulsion    . TPN (CLINIMIX) +/- additives 80  mL/hr at 06/10/11 0510  . TPN (CLINIMIX) +/- additives 80 mL/hr at 06/09/11 0634  . TPN (CLINIMIX) +/- additives     PRN Meds:.acetaminophen, hydrALAZINE, LORazepam, morphine injection, phenol, sodium chloride  Assessment/Plan:  Principal Problem:  *Abdominal pain, acute - likely secondary to recent bowel perforation; status post Exp. Lap, lysis of adhesions, antrectomy, proximal and duodenal resection, bilroth II, gastrojejunostomy anastomosis, Omental patch of duodenal bulb, gastrostomy, jejunostomy. 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 Surg and IR following; Continue TPN , Bowel rest with clears.  Culture Abscess no growth.  Patient started on Imipenem Nov 8 and stopped nov 22. Then restarted Nov 30. We will check with ID in  Regards to duration of antibiotic treatment.  Active Problems:  Anemia - Hgb/Hct stable at present, no transfusion warranted for now   Hyponatremia - slowly improving; continue to monitor   Esophagitis, erosive - continue protonix   HYPERTENSION - at goal; continue metoprolol  DM - continue CBG monitoring and sliding scale insulin            LOS: 35 days   [name] 06/10/2011, 4:09 PM

## 2011-06-10 NOTE — Progress Notes (Signed)
This patient was discussed at long LOS rounds this morning. 

## 2011-06-10 NOTE — Progress Notes (Signed)
ANTIBIOTIC CONSULT NOTE - FOLLOW UP  Pharmacy Consult for primaxin Indication: UTI(pseudomonas/ecoli)/IA abscess   Allergies  Allergen Reactions  . Other Swelling    Pinto beans cause swelling and hives  . Penicillins Swelling    Patient Measurements: Height: 5\' 6"  (167.6 cm) Weight: 164 lb 7.4 oz (74.6 kg) IBW/kg (Calculated) : 59.3  Adjusted Body Weight:   Vital Signs: Temp: 100.1 F (37.8 C) (12/12 0533) Temp src: Oral (12/12 0533) BP: 138/79 mmHg (12/12 0533) Pulse Rate: 92  (12/12 0533) Intake/Output from previous day: 12/11 0701 - 12/12 0700 In: 5410.8 [I.V.:1457.5; IV Piggyback:100; TPN:3853.3] Out: 2430 [Urine:2400; Drains:30] Intake/Output from this shift:    Labs:  Basename 06/10/11 0500 06/09/11 0607  WBC -- 9.3  HGB -- 9.9*  PLT -- 345  LABCREA -- --  CREATININE 0.45* 0.46*   Estimated Creatinine Clearance: 60.8 ml/min (by C-G formula based on Cr of 0.45). No results found for this basename: VANCOTROUGH:2,VANCOPEAK:2,VANCORANDOM:2,GENTTROUGH:2,GENTPEAK:2,GENTRANDOM:2,TOBRATROUGH:2,TOBRAPEAK:2,TOBRARND:2,AMIKACINPEAK:2,AMIKACINTROU:2,AMIKACIN:2, in the last 72 hours   Microbiology: Recent Results (from the past 720 hour(s))  CULTURE, RESPIRATORY     Status: Normal   Collection Time   05/13/11  6:02 AM      Component Value Range Status Comment   Specimen Description ENDOTRACHEAL   Final    Special Requests NONE   Final    Gram Stain     Final    Value: FEW WBC PRESENT, PREDOMINANTLY PMN     RARE SQUAMOUS EPITHELIAL CELLS PRESENT     NO ORGANISMS SEEN   Culture NO GROWTH 2 DAYS   Final    Report Status 05/15/2011 FINAL   Final   URINE CULTURE     Status: Normal   Collection Time   05/30/11  5:34 AM      Component Value Range Status Comment   Specimen Description URINE, CATHETERIZED   Final    Special Requests NONE   Final    Setup Time [id number]   Final    Colony Count >=100,000 COLONIES/ML   Final    Culture     Final    Value:  PSEUDOMONAS AERUGINOSA     ESCHERICHIA COLI   Report Status 06/02/2011 FINAL   Final    Organism ID, Bacteria PSEUDOMONAS AERUGINOSA   Final    Organism ID, Bacteria ESCHERICHIA COLI   Final   CULTURE, ROUTINE-ABSCESS     Status: Normal   Collection Time   05/31/11  1:29 PM      Component Value Range Status Comment   Specimen Description ABSCESS ABDOMEN RUQ   Final    Special Requests PATIENT ON FOLLOWING VANC, CIRPO, PRIMAXIN   Final    Gram Stain     Final    Value: NO WBC SEEN     NO SQUAMOUS EPITHELIAL CELLS SEEN     NO ORGANISMS SEEN   Culture NO GROWTH 3 DAYS   Final    Report Status 06/03/2011 FINAL   Final     Anti-infectives     Start     Dose/Rate Route Frequency Ordered Stop   06/03/11 2115   ciprofloxacin (CIPRO) IVPB 400 mg  Status:  Discontinued     Comments: Please mix with NS.      400 mg 200 mL/hr over 60 Minutes Intravenous Every 12 hours 06/03/11 2114 06/03/11 2121   05/30/11 1130   ciprofloxacin (CIPRO) IVPB 400 mg  Status:  Discontinued        400 mg 200 mL/hr over 60 Minutes Intravenous  2 times daily 05/30/11 1038 06/02/11 1618   05/29/11 2100   imipenem-cilastatin (PRIMAXIN) 500 mg in sodium chloride 0.9 % 100 mL IVPB        500 mg 200 mL/hr over 30 Minutes Intravenous Every 8 hours 05/29/11 2007     05/29/11 2100   vancomycin (VANCOCIN) 1,250 mg in sodium chloride 0.9 % 250 mL IVPB  Status:  Discontinued        1,250 mg 166.7 mL/hr over 90 Minutes Intravenous Every 24 hours 05/29/11 2007 06/03/11 1006   05/07/11 1400   imipenem-cilastatin (PRIMAXIN) 500 mg in sodium chloride 0.9 % 100 mL IVPB        500 mg 200 mL/hr over 30 Minutes Intravenous 3 times per day 05/07/11 1106 05/21/11 1359   05/07/11 1200   micafungin (MYCAMINE) 100 mg in sodium chloride 0.9 % 100 mL IVPB  Status:  Discontinued     Comments: PER PHARMACY      100 mg 100 mL/hr over 1 Hours Intravenous Every 24 hours 05/07/11 1032 05/19/11 1103   05/07/11 1200   vancomycin (VANCOCIN)  1,250 mg in sodium chloride 0.9 % 250 mL IVPB  Status:  Discontinued        1,250 mg 166.7 mL/hr over 90 Minutes Intravenous Every 24 hours 05/07/11 1106 05/19/11 1103   05/07/11 1000   micafungin (MYCAMINE) 150 mg in sodium chloride 0.9 % 100 mL IVPB  Status:  Discontinued     Comments: PER PHARMACY      150 mg 100 mL/hr over 1 Hours Intravenous Daily 05/07/11 0951 05/07/11 1029   05/07/11 0300   ertapenem (INVANZ) 1 g in sodium chloride 0.9 % 50 mL IVPB        1 g 100 mL/hr over 30 Minutes Intravenous  Once 05/07/11 0300 05/07/11 0348   05/07/11 0130   cefTRIAXone (ROCEPHIN) 1 g in dextrose 5 % 50 mL IVPB        1 g 100 mL/hr over 30 Minutes Intravenous  Once 05/07/11 0122 05/07/11 0200          Assessment: Day #12 primaxin for multi organism uti and intra abdominal abscess. Renal function has remained stable. Slight temp of 100 overnight, wbc 9. No dose adjustments are necessary. At this time patient appears stable from an antibiotic standpoint. Pharmacy will sign off of antibiotic consult and continue to follow for TPN adjustments.   Continue primaxin 500mg  iv q 8 hours  Thank you for the consult and please reconsult [place] if you have any questions or concerns.  Severiano Gilbert 06/10/2011,11:01 AM

## 2011-06-10 NOTE — Progress Notes (Signed)
Patient ID: [name], female   DOB: [birth year], 77 y.o.   MRN: [id number] 34 Days Post-Op  Subjective: C/O some discomfort, abd and generally all over.  She feels like she is still febrile, 100.1 early this AM. Right abd drain is clear,, nothing recorded,The mid-abd Perc. Drain white purulent. Gastrostomy has been closed and is no longer draining. Urine looks clear, foley still in. Objective: Vital signs in last 24 hours: Temp:  [98.7 F (37.1 C)-100.1 F (37.8 C)] 100.1 F (37.8 C) (12/12 0533) Pulse Rate:  [82-93] 92  (12/12 0533) Resp:  [16-93] 22  (12/12 0533) BP: (127-161)/(78-88) 138/79 mmHg (12/12 0533) SpO2:  [95 %-97 %] 95 % (12/12 0533) Weight:  [74.6 kg (164 lb 7.4 oz)] 164 lb 7.4 oz (74.6 kg) (12/12 0533) Last BM Date: 06/08/11  Intake/Output from previous day: 12/11 0701 - 12/12 0700 In: 5410.8 [I.V.:1457.5; IV Piggyback:100; TPN:3853.3] Out: 2430 [Urine:2400; Drains:30] Intake/Output this shift:    PE: Abd: soft, NT, ND, +BS, g tube closed and not draining now., Incision almost healed.  Lab Results:   [name] 06/09/11 0607  WBC 9.3  HGB 9.9*  HCT 30.6*  PLT 345   BMET  Basename 06/10/11 0500 06/09/11 0607  NA 131* 129*  K 3.7 3.3*  CL 99 98  CO2 23 25  GLUCOSE 136* 121*  BUN 14 13  CREATININE 0.45* 0.46*  CALCIUM 8.6 8.5   PT/INR No results found for this basename: LABPROT:2,INR:2 in the last 72 hours   Studies/Results: Ct Abdomen Pelvis W Contrast  06/08/2011  *RADIOLOGY REPORT*  Clinical Data: Evaluate fluid collections, intra-abdominal abscess post percutaneous drainage  CT ABDOMEN AND PELVIS WITH CONTRAST  Technique:  Multidetector CT imaging of the abdomen and pelvis was performed following the standard protocol during bolus administration of intravenous contrast. Sagittal and coronal MPR images reconstructed from axial data set.  Contrast: 80mL OMNIPAQUE IOHEXOL 300 IV; Dilute contrast by G-tube.  Comparison: 05/29/2011, 05/31/2011   Findings: Small pericardial effusion. Minimal bibasilar effusions and atelectasis. Gallbladder surgically absent. Liver, spleen, and adrenal glands normal. Tiny bilateral renal cysts and probable tiny left renal calculi, nonobstructing. Atrophic pancreas with parenchymal calcifications of body. Prior antrectomy with gastrojejunostomy and gastrostomy tube. Gastric remnant demonstrates a mildly thickened wall, nonspecific, question artifact underdistension versus edema. Postsurgical infiltrative changes in the upper abdomen.  Percutaneous drainage catheter identified adjacent to the lateral segment left lobe liver, associated fluid collection decreased in size, residual 3.8 x 1.5 cm. Additional fluid collection lateral to the stomach, 2.4 x 2.3 x 5.2 cm, previously 3.2 x 2.6 x 6.4 cm. Third fluid collection identified medial to the lateral segment left lobe liver, 3.3 x 2.7 cm, previously 4.2 x 3.7 cm at same level. No new intra abdominal fluid collections identified.  Beam hardening artifacts in pelvis from right hip prosthesis. Scattered diverticula descending and sigmoid colon. Foley catheter decompresses urinary bladder. Atrophic uterus with unremarkable ovaries and appendix. No mass, adenopathy, or free air. Long catheter within abdomen, question peritoneal dialysis catheter. Scattered atherosclerotic calcifications. Areas subcutaneous infiltration or nodularity in the left abdominal wall greater than right likely related to medication injection sites. Bones are diffusely demineralized.  IMPRESSION: Decrease in sizes of the upper abdominal fluid collections since previous exam.  Wall thickening of gastric remnant though this could be an artifact from underdistension. Small pericardial effusion. Descending and sigmoid colonic diverticulosis. Bibasilar atelectasis and minimal pleural effusions.  Original Report Authenticated By: Lollie Marrow, M.D.    Anti-infectives:  Anti-infectives     Start     Dose/Rate  Route Frequency Ordered Stop   06/03/11 2115   ciprofloxacin (CIPRO) IVPB 400 mg  Status:  Discontinued     Comments: Please mix with NS.      400 mg 200 mL/hr over 60 Minutes Intravenous Every 12 hours 06/03/11 2114 06/03/11 2121   05/30/11 1130   ciprofloxacin (CIPRO) IVPB 400 mg  Status:  Discontinued        400 mg 200 mL/hr over 60 Minutes Intravenous 2 times daily 05/30/11 1038 06/02/11 1618   05/29/11 2100   imipenem-cilastatin (PRIMAXIN) 500 mg in sodium chloride 0.9 % 100 mL IVPB        500 mg 200 mL/hr over 30 Minutes Intravenous Every 8 hours 05/29/11 2007     05/29/11 2100   vancomycin (VANCOCIN) 1,250 mg in sodium chloride 0.9 % 250 mL IVPB  Status:  Discontinued        1,250 mg 166.7 mL/hr over 90 Minutes Intravenous Every 24 hours 05/29/11 2007 06/03/11 1006   05/07/11 1400   imipenem-cilastatin (PRIMAXIN) 500 mg in sodium chloride 0.9 % 100 mL IVPB        500 mg 200 mL/hr over 30 Minutes Intravenous 3 times per day 05/07/11 1106 05/21/11 1359   05/07/11 1200   micafungin (MYCAMINE) 100 mg in sodium chloride 0.9 % 100 mL IVPB  Status:  Discontinued     Comments: PER PHARMACY      100 mg 100 mL/hr over 1 Hours Intravenous Every 24 hours 05/07/11 1032 05/19/11 1103   05/07/11 1200   vancomycin (VANCOCIN) 1,250 mg in sodium chloride 0.9 % 250 mL IVPB  Status:  Discontinued        1,250 mg 166.7 mL/hr over 90 Minutes Intravenous Every 24 hours 05/07/11 1106 05/19/11 1103   05/07/11 1000   micafungin (MYCAMINE) 150 mg in sodium chloride 0.9 % 100 mL IVPB  Status:  Discontinued     Comments: PER PHARMACY      150 mg 100 mL/hr over 1 Hours Intravenous Daily 05/07/11 0951 05/07/11 1029   05/07/11 0300   ertapenem (INVANZ) 1 g in sodium chloride 0.9 % 50 mL IVPB        1 g 100 mL/hr over 30 Minutes Intravenous  Once 05/07/11 0300 05/07/11 0348   05/07/11 0130   cefTRIAXone (ROCEPHIN) 1 g in dextrose 5 % 50 mL IVPB        1 g 100 mL/hr over 30 Minutes Intravenous  Once  05/07/11 0122 05/07/11 0200           Assessment/Plan  1. 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 PCM/TNA  Plan: 1. Both intra-abd fluid collections have overall decreased in size.  Ct scan appears improved 2. Cont TNA 3. Started on clear liquids yesterday and seems to be doing OK so far. LOS: 35 days    Shunte Senseney 06/10/2011

## 2011-06-10 NOTE — Progress Notes (Signed)
Tolerating clears with gastrostomy clamped.    Wilmon Arms. Corliss Skains, MD, [facility]  06/10/2011 2:51 PM

## 2011-06-11 ENCOUNTER — Other Ambulatory Visit: Payer: Self-pay

## 2011-06-11 LAB — COMPREHENSIVE METABOLIC PANEL
AST: 13 U/L (ref 0–37)
Albumin: 1.9 g/dL — ABNORMAL LOW (ref 3.5–5.2)
BUN: 13 mg/dL (ref 6–23)
Calcium: 8.3 mg/dL — ABNORMAL LOW (ref 8.4–10.5)
Creatinine, Ser: 0.46 mg/dL — ABNORMAL LOW (ref 0.50–1.10)
Total Protein: 6 g/dL (ref 6.0–8.3)

## 2011-06-11 LAB — GLUCOSE, CAPILLARY
Glucose-Capillary: 131 mg/dL — ABNORMAL HIGH (ref 70–99)
Glucose-Capillary: 145 mg/dL — ABNORMAL HIGH (ref 70–99)
Glucose-Capillary: 147 mg/dL — ABNORMAL HIGH (ref 70–99)
Glucose-Capillary: 171 mg/dL — ABNORMAL HIGH (ref 70–99)
Glucose-Capillary: 171 mg/dL — ABNORMAL HIGH (ref 70–99)

## 2011-06-11 LAB — CARDIAC PANEL(CRET KIN+CKTOT+MB+TROPI)
Relative Index: INVALID (ref 0.0–2.5)
Relative Index: INVALID (ref 0.0–2.5)
Total CK: 16 U/L (ref 7–177)
Troponin I: <0.3 ng/mL (ref <0.30)

## 2011-06-11 LAB — MAGNESIUM: Magnesium: 1.6 mg/dL (ref 1.5–2.5)

## 2011-06-11 LAB — PHOSPHORUS: Phosphorus: 4.3 mg/dL (ref 2.3–4.6)

## 2011-06-11 MED ORDER — MORPHINE SULFATE 2 MG/ML IJ SOLN
2.0000 mg | INTRAMUSCULAR | Status: DC | PRN
  Administered 2011-06-11 – 2011-06-12 (×4): 4 mg via INTRAVENOUS
  Administered 2011-06-12 (×2): 2 mg via INTRAVENOUS
  Administered 2011-06-12: 4 mg via INTRAVENOUS
  Administered 2011-06-12: 2 mg via INTRAVENOUS
  Administered 2011-06-12 – 2011-06-18 (×36): 4 mg via INTRAVENOUS
  Administered 2011-06-19 – 2011-06-20 (×6): 2 mg via INTRAVENOUS
  Administered 2011-06-21: 4 mg via INTRAVENOUS
  Administered 2011-06-21: 2 mg via INTRAVENOUS
  Administered 2011-06-21 – 2011-06-22 (×3): 4 mg via INTRAVENOUS
  Administered 2011-06-22 – 2011-06-26 (×8): 2 mg via INTRAVENOUS
  Administered 2011-06-26: 4 mg via INTRAVENOUS
  Administered 2011-06-26: 2 mg via INTRAVENOUS
  Administered 2011-06-27: 4 mg via INTRAVENOUS
  Administered 2011-06-27 (×2): 2 mg via INTRAVENOUS
  Administered 2011-06-27 (×2): 4 mg via INTRAVENOUS
  Administered 2011-06-28: 2 mg via INTRAVENOUS
  Administered 2011-06-28 – 2011-07-01 (×17): 4 mg via INTRAVENOUS
  Administered 2011-07-02 – 2011-07-03 (×4): 2 mg via INTRAVENOUS
  Administered 2011-07-03 (×2): 4 mg via INTRAVENOUS
  Administered 2011-07-04: 2 mg via INTRAVENOUS
  Administered 2011-07-04 (×4): 4 mg via INTRAVENOUS
  Administered 2011-07-04: 2 mg via INTRAVENOUS
  Administered 2011-07-05 (×3): 4 mg via INTRAVENOUS
  Filled 2011-06-11: qty 1
  Filled 2011-06-11 (×4): qty 2
  Filled 2011-06-11 (×2): qty 1
  Filled 2011-06-11 (×4): qty 2
  Filled 2011-06-11: qty 1
  Filled 2011-06-11: qty 2
  Filled 2011-06-11: qty 1
  Filled 2011-06-11 (×12): qty 2
  Filled 2011-06-11: qty 1
  Filled 2011-06-11 (×3): qty 2
  Filled 2011-06-11: qty 1
  Filled 2011-06-11 (×2): qty 2
  Filled 2011-06-11: qty 1
  Filled 2011-06-11 (×2): qty 2
  Filled 2011-06-11 (×2): qty 1
  Filled 2011-06-11 (×2): qty 2
  Filled 2011-06-11: qty 1
  Filled 2011-06-11 (×8): qty 2
  Filled 2011-06-11: qty 1
  Filled 2011-06-11 (×7): qty 2
  Filled 2011-06-11: qty 1
  Filled 2011-06-11 (×3): qty 2
  Filled 2011-06-11: qty 1
  Filled 2011-06-11: qty 2
  Filled 2011-06-11 (×2): qty 1
  Filled 2011-06-11 (×2): qty 2
  Filled 2011-06-11: qty 1
  Filled 2011-06-11: qty 2
  Filled 2011-06-11 (×2): qty 1
  Filled 2011-06-11 (×3): qty 2
  Filled 2011-06-11: qty 1
  Filled 2011-06-11 (×7): qty 2
  Filled 2011-06-11: qty 1
  Filled 2011-06-11 (×3): qty 2
  Filled 2011-06-11: qty 1
  Filled 2011-06-11: qty 2
  Filled 2011-06-11 (×2): qty 1
  Filled 2011-06-11 (×3): qty 2
  Filled 2011-06-11 (×2): qty 1
  Filled 2011-06-11 (×2): qty 2
  Filled 2011-06-11: qty 1
  Filled 2011-06-11 (×2): qty 2
  Filled 2011-06-11 (×2): qty 1
  Filled 2011-06-11 (×3): qty 2

## 2011-06-11 MED ORDER — INSULIN REGULAR HUMAN 100 UNIT/ML IJ SOLN
INTRAVENOUS | Status: AC
  Administered 2011-06-11: 18:00:00 via INTRAVENOUS
  Filled 2011-06-11: qty 2000

## 2011-06-11 NOTE — Progress Notes (Signed)
PARENTERAL NUTRITION CONSULT NOTE - FOLLOW UP Pharmacy Consult for TPN  Indication:  Not tolerating TF. s/p gastrectomy, gastrostomy, cholecysectomy for erosive esophagitis and perforated ulcer. Intraabdominal abscess with possible fistula (seen on UGI).  Labs:  [facility] 06/09/11 0607  WBC 9.3  HGB 9.9*  HCT 30.6*  PLT 345  APTT --  INR --     Basename 06/11/11 0420 06/10/11 0500 06/09/11 0607  NA 129* 131* 129*  K 3.6 3.7 3.3*  CL 98 99 98  CO2 22 23 25   GLUCOSE 151* 136* 121*  BUN 13 14 13   CREATININE 0.46* 0.45* 0.46*  LABCREA -- -- --  CREAT24HRUR -- -- --  CALCIUM 8.3* 8.6 8.5  MG 1.6 1.7 1.4*  PHOS 4.3 -- --  PROT 6.0 6.1 6.2  ALBUMIN 1.9* 1.9* 1.9*  AST 13 13 12   ALT 7 6 6   ALKPHOS 84 84 87  BILITOT 0.2* 0.3 0.2*  BILIDIR -- -- --  IBILI -- -- --  PREALBUMIN -- -- 12.0*  CHOLHDL -- -- --  CHOL -- -- 128   Estimated Creatinine Clearance: 60.8 ml/min (by C-G formula based on Cr of 0.46).    Basename 06/11/11 0448 06/11/11 0005 06/10/11 2025  GLUCAP 145* 147* 161*     Insulin Requirements in the past 24 hours:  50 units regular insulin in TPN  Assessment:  77 y/o previously on TPN (for erosive esophagitis with perforated pyloric ulcer s/p lap. 11/7 with gastrostomy, gastrectomy and cholecystectomy) TPN initially started 05/12/11 - 05/14/2011. 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 TPN changed to continuous 24h infusion d/t low Na.   Patient on clear liquid diet and surgery plans to advance to full liquid tomorrow.  1. Endo - CBGs adequately controlled with 50 units insulin in TPN. 2. Lytes - hyponatremia persists, others WNL 3. Renal fxn stable, inaccurate I/O's 4.  LFTs, tbili WNL   Nutritional Goals:  1785-2000 kcal and 95-110 g protein   Plan:  1.  Continue Clinimix E 5/15 at 24ml/hr, no lipids today. 2.  Due to national backorder, lipids, MVI, and  trace elements will be given MWF only.  3.  Monitor Na+, unable to correct with pre-made Clinimix 4.  F/U diet advancement to start weaning TPN.   [name] 06/11/2011,8:53 AM

## 2011-06-11 NOTE — Progress Notes (Signed)
Utilization Review Completed.Sandor Arboleda T12/13/2012   

## 2011-06-11 NOTE — Progress Notes (Signed)
PT/ Cancellation Note  Pt refusing to work with PT, even at EOB secondary to pain and nausea. PT attempted again after medication and pt continuing to refuse. Will attempt once more at later time, if pt not participating again will have to D/C PT unfortunately.   Herman Mell ([name]) [name] PT, DPT Acute Rehabilitation [phone]

## 2011-06-11 NOTE — Progress Notes (Signed)
Ms. Gunnarson A.  in 9.  She c/o of chest pain.  However vital done at this time were 99.1 oral, pulse 78, BP 124/74 resp 20, Sat 94% r/a.  Had EKG done per protocol and it was NSR.  Afterwords she said it's really  her stomach hurting.  Pain medication given.

## 2011-06-11 NOTE — Progress Notes (Signed)
Patient ID: [name], female   DOB: 10-21-1931, 77 y.o.   MRN: [id number] Subjective: No events overnight. Continues to have abdominal pain although significantly better since admission.  Objective:  Vital signs in last 24 hours:  Filed Vitals:   06/10/11 2142 06/11/11 0101 06/11/11 1054 06/11/11 1355  BP: 138/77 128/78 126/76 138/82  Pulse: 88 88 98 101  Temp: 99.3 F (37.4 C) 99.9 F (37.7 C)  100.8 F (38.2 C)  TempSrc: Oral Oral  Oral  Resp: 20 20  20   Height:      Weight:      SpO2: 95% 98%  94%    Intake/Output from previous day:   Intake/Output Summary (Last 24 hours) at 06/11/11 1726 Last data filed at 06/10/11 2156  Gross per 24 hour  Intake      0 ml  Output   1475 ml  Net  -1475 ml    Physical Exam: General: Alert, awake, oriented x3, in no acute distress. HEENT: No bruits, no goiter. Moist mucous membranes, no scleral icterus, no conjunctival pallor. Heart: Regular rate and rhythm, without murmurs, rubs, gallops. Lungs: Clear to auscultation bilaterally. No wheezing, no rhonchi, no rales.  Abdomen: Soft, nontender, nondistended, positive bowel sounds. Extremities: No clubbing cyanosis or edema,  positive pedal pulses. Neuro: Grossly intact, nonfocal.    Lab Results:  Basic Metabolic Panel:    Component Value Date/Time   NA 129* 06/11/2011 0420   K 3.6 06/11/2011 0420   CL 98 06/11/2011 0420   CO2 22 06/11/2011 0420   BUN 13 06/11/2011 0420   CREATININE 0.46* 06/11/2011 0420   GLUCOSE 151* 06/11/2011 0420   CALCIUM 8.3* 06/11/2011 0420   CBC:    Component Value Date/Time   WBC 9.3 06/09/2011 0607   HGB 9.9* 06/09/2011 0607   HCT 30.6* 06/09/2011 0607   PLT 345 06/09/2011 0607   MCV 84.5 06/09/2011 0607   NEUTROABS 5.4 06/01/2011 1110   LYMPHSABS 1.0 06/01/2011 1110   MONOABS 0.5 06/01/2011 1110   EOSABS 0.4 06/01/2011 1110   BASOSABS 0.0 06/01/2011 1110      Lab 06/09/11 0607 06/07/11 0500 06/05/11 0710  WBC 9.3 7.6 7.4  HGB 9.9*  10.4* 10.3*  HCT 30.6* 31.6* 31.8*  PLT 345 284 365  MCV 84.5 84.3 83.9  MCH 27.3 27.7 27.2  MCHC 32.4 32.9 32.4  RDW 18.2* 18.4* 18.5*  LYMPHSABS -- -- --  MONOABS -- -- --  EOSABS -- -- --  BASOSABS -- -- --  BANDABS -- -- --    Lab 06/11/11 0420 06/10/11 0500 06/09/11 0607 06/07/11 0500 06/06/11 0858 06/05/11 0710  NA 129* 131* 129* 131* 128* --  K 3.6 3.7 3.3* 3.6 3.7 --  CL 98 99 98 98 95* --  CO2 22 23 25 24 24  --  GLUCOSE 151* 136* 121* 135* 143* --  BUN 13 14 13 12 14  --  CREATININE 0.46* 0.45* 0.46* 0.46* 0.40* --  CALCIUM 8.3* 8.6 8.5 9.1 8.8 --  MG 1.6 1.7 1.4* -- 1.5 1.4*   No results found for this basename: INR:5,PROTIME:5 in the last 168 hours Cardiac markers:  Lab 06/11/11 1250 06/11/11 0400  CKMB 1.7 2.1  TROPONINI <0.30 <0.30  MYOGLOBIN -- --   No components found with this basename: POCBNP:3 No results found for this or any previous visit (from the past 240 hour(s)).  Studies/Results: No results found.  Medications: Scheduled Meds:   . cloNIDine  0.1 mg Transdermal Weekly  .  feeding supplement  1 Container Oral TID WC  . heparin subcutaneous  5,000 Units Subcutaneous Q8H  . imipenem-cilastatin  500 mg Intravenous Q8H  . lip balm  1 application Topical BID  . metoprolol  5 mg Intravenous Q12H  . pantoprazole (PROTONIX) IV  40 mg Intravenous Q12H   Continuous Infusions:   . sodium chloride 50 mL/hr at 06/10/11 0510  . fat emulsion 240 mL (06/10/11 1744)  . TPN (CLINIMIX) +/- additives 80 mL/hr at 06/10/11 0510  . TPN (CLINIMIX) +/- additives    . TPN (CLINIMIX) +/- additives 80 mL/hr at 06/10/11 1744   PRN Meds:.acetaminophen, hydrALAZINE, LORazepam, morphine injection, phenol, sodium chloride, DISCONTD:  morphine injection  Assessment/Plan:  Principal Problem:  *Abdominal pain, acute - likely secondary to recent bowel perforation; status post Exp. Lap, lysis of adhesions, antrectomy, proximal and duodenal resection, bilroth II,  gastrojejunostomy anastomosis, Omental patch of duodenal bulb, gastrostomy, jejunostomy. 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 Surg and IR following; Continue TPN , Bowel rest with clears. Culture Abscess no growth.  Patient started on Imipenem Nov 8 and stopped nov 22. Then restarted Nov 30. We will check with ID in [place] to duration of antibiotic treatment.   Active Problems:  Anemia - Hgb/Hct stable at present  Hyponatremia - slowly improving; continue to monitor   Esophagitis, erosive - continue protonix   HYPERTENSION - continue metoprolol   DM - continue CBG monitoring and sliding scale insulin     LOS: 36 days   [name] 06/11/2011, 5:26 PM

## 2011-06-11 NOTE — Progress Notes (Addendum)
35 Days Post-Op  Subjective: No complaints this am.   Had episode of abdominal pain last night, since resolved.  No nausea.    Objective: Vital signs in last 24 hours: Temp:  [99.2 F (37.3 C)-99.9 F (37.7 C)] 99.9 F (37.7 C) (12/13 0101) Pulse Rate:  [87-88] 88  (12/13 0101) Resp:  [20] 20  (12/13 0101) BP: (128-138)/(77-78) 128/78 mmHg (12/13 0101) SpO2:  [95 %-98 %] 98 % (12/13 0101) Last BM Date: 06/08/11  Intake/Output this shift:    Intake/Output Summary (Last 24 hours) at 06/11/11 0805 Last data filed at 06/10/11 2156  Gross per 24 hour  Intake      0 ml  Output   2475 ml  Net  -2475 ml     Physical Exam: BP 128/78  Pulse 88  Temp(Src) 99.9 F (37.7 C) (Oral)  Resp 20  Ht 5\' 6"  (1.676 m)  Wt 164 lb 7.4 oz (74.6 kg)  BMI 26.55 kg/m2  SpO2 98% Gen: In bed, sleeping but easy to wake.  NAD Abd: Soft, NT/ND. +BS.  Drains with small amount of serous fluid in one and purulent milky/gray fluid in other.   Labs: CBC  Basename 06/09/11 0607  WBC 9.3  HGB 9.9*  HCT 30.6*  PLT 345   BMET  Basename 06/11/11 0420 06/10/11 0500  NA 129* 131*  K 3.6 3.7  CL 98 99  CO2 22 23  GLUCOSE 151* 136*  BUN 13 14  CREATININE 0.46* 0.45*  CALCIUM 8.3* 8.6   LFT  Basename 06/11/11 0420  PROT 6.0  ALBUMIN 1.9*  AST 13  ALT 7  ALKPHOS 84  BILITOT 0.2*  BILIDIR --  IBILI --  LIPASE --    Assessment: Principal Problem:  *Abdominal pain, acute Active Problems:  DIABETES MELLITUS, TYPE II  HYPERTENSION  Anemia  Hyponatremia  Metabolic acidosis  Esophagitis, erosive  Acute respiratory failure   Procedure(s): EXPLORATORY LAPAROTOMY GASTROSTOMY GASTRECTOMY CHOLECYSTECTOMY  Plan: -Cont JP drain, will need to repeat CT next week.  If pockets of fluid gone can pull drains at that time.  Last CT does show some decrease in overall size of areas of fluid collection -Cont TNA -Cont primaxin -Tolerating clears, will consider advancing to fulls tomorrow if  continues to tolerate clears.   LOS: 36 days    MATTHEWS,CODY 06/11/2011  Feels worse today - tender abdomen, nauseated.  Will place gastrostomy back to straight drain, NPO  Wilmon Arms. Corliss Skains, MD, [facility]  06/11/2011 1:17 PM

## 2011-06-12 LAB — COMPREHENSIVE METABOLIC PANEL
ALT: 7 U/L (ref 0–35)
Albumin: 1.8 g/dL — ABNORMAL LOW (ref 3.5–5.2)
Alkaline Phosphatase: 83 U/L (ref 39–117)
BUN: 14 mg/dL (ref 6–23)
Chloride: 99 mEq/L (ref 96–112)
Glucose, Bld: 127 mg/dL — ABNORMAL HIGH (ref 70–99)
Potassium: 3.5 mEq/L (ref 3.5–5.1)
Total Bilirubin: 0.3 mg/dL (ref 0.3–1.2)

## 2011-06-12 LAB — CBC
MCH: 27.8 pg (ref 26.0–34.0)
MCHC: 32.6 g/dL (ref 30.0–36.0)
MCV: 85.3 fL (ref 78.0–100.0)
Platelets: 380 10*3/uL (ref 150–400)
RBC: 3.53 MIL/uL — ABNORMAL LOW (ref 3.87–5.11)

## 2011-06-12 LAB — GLUCOSE, CAPILLARY
Glucose-Capillary: 132 mg/dL — ABNORMAL HIGH (ref 70–99)
Glucose-Capillary: 121 mg/dL — ABNORMAL HIGH (ref 70–99)
Glucose-Capillary: 129 mg/dL — ABNORMAL HIGH (ref 70–99)

## 2011-06-12 MED ORDER — TRACE MINERALS CR-CU-MN-SE-ZN 10-1000-500-60 MCG/ML IV SOLN
INTRAVENOUS | Status: AC
  Administered 2011-06-12: 18:00:00 via INTRAVENOUS
  Filled 2011-06-12: qty 2000

## 2011-06-12 MED ORDER — FAT EMULSION 20 % IV EMUL
250.0000 mL | INTRAVENOUS | Status: AC
  Administered 2011-06-12: 250 mL via INTRAVENOUS
  Filled 2011-06-12: qty 250

## 2011-06-12 NOTE — Progress Notes (Signed)
PARENTERAL NUTRITION CONSULT NOTE - FOLLOW UP Pharmacy Consult for TPN  Indication:  Not tolerating TF. s/p gastrectomy, gastrostomy, cholecysectomy for erosive esophagitis and perforated ulcer. Intraabdominal abscess with possible fistula (seen on UGI).  Labs:  [facility] 06/12/11 0519  WBC 7.8  HGB 9.8*  HCT 30.1*  PLT 380  APTT --  INR --     Basename 06/12/11 0519 06/11/11 0420 06/10/11 0500  NA 130* 129* 131*  K 3.5 3.6 3.7  CL 99 98 99  CO2 25 22 23   GLUCOSE 127* 151* 136*  BUN 14 13 14   CREATININE 0.47* 0.46* 0.45*  LABCREA -- -- --  CREAT24HRUR -- -- --  CALCIUM 8.5 8.3* 8.6  MG -- 1.6 1.7  PHOS -- 4.3 --  PROT 6.1 6.0 6.1  ALBUMIN 1.8* 1.9* 1.9*  AST 12 13 13   ALT 7 7 6   ALKPHOS 83 84 84  BILITOT 0.3 0.2* 0.3  BILIDIR -- -- --  IBILI -- -- --  PREALBUMIN -- -- --  CHOLHDL -- -- --  CHOL -- -- --   Estimated Creatinine Clearance: 60.8 ml/min (by C-G formula based on Cr of 0.47).    Basename 06/12/11 0829 06/12/11 0429 06/11/11 2348  GLUCAP 132* 128* 131*     Insulin Requirements in the past 24 hours:  50 units regular insulin in TPN  Assessment:  77 y/o previously on TPN (for erosive esophagitis with perforated pyloric ulcer s/p lap. 11/7 with gastrostomy, gastrectomy and cholecystectomy) TPN initially started 05/12/11 - 05/14/2011. 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 TPN changed to continuous 24h infusion d/t low Na.  Gastric tube placed to gravity yesterday with NPO status.  1. Endo - CBGs adequately controlled with 50 units insulin in TPN. 2. Lytes - hyponatremia persists, others WNL 3. Renal fxn stable, inaccurate I/O's 4.  LFTs, tbili WNL   Nutritional Goals:  1785-2000 kcal and 95-110 g protein   Plan:  1.  Continue Clinimix E 5/15 at 62ml/hr, Lipids today. 2.  Due to national backorder, lipids, MVI, and trace elements will be given MWF only.    3.  Monitor Na+, unable to correct with pre-made Clinimix 4.  F/U diet advancement to start weaning TPN.   Mickeal Skinner 06/12/2011,10:27 AM

## 2011-06-12 NOTE — Progress Notes (Signed)
PT Cancellation/Discharge Note  Pt refusing PT again today, pt has now refused >3x to participate with therapy. Per policy PT will signoff however we understand pt does not feel well. Please re-order if pt able/willing to participate with PT in the future. Discharge plan is still appropriate. Thank you!  [name] ([name]) [name] PT, DPT Acute Rehabilitation [phone]

## 2011-06-12 NOTE — Progress Notes (Signed)
Patient ID: [name], female   DOB: [birth year], 77 y.o.   MRN: [id number] Subjective: No events overnight. Patient denies chest pain, shortness of breath but complains of abdominal pain.  Objective:  Vital signs in last 24 hours:  Filed Vitals:   06/11/11 1355 06/11/11 2222 06/12/11 0422 06/12/11 1300  BP: 138/82 132/76 138/82 124/77  Pulse: 101 94 88 90  Temp: 100.8 F (38.2 C) 99.6 F (37.6 C) 98.9 F (37.2 C) 98.6 F (37 C)  TempSrc: Oral Oral Oral Oral  Resp: 20 20 20 20   Height:      Weight:   74.6 kg (164 lb 7.4 oz)   SpO2: 94% 98% 93% 95%    Intake/Output from previous day:   Intake/Output Summary (Last 24 hours) at 06/12/11 1625 Last data filed at 06/12/11 0900  Gross per 24 hour  Intake    988 ml  Output   2320 ml  Net  -1332 ml    Physical Exam: General: Alert, awake, in no acute distress. HEENT: No bruits, no goiter. Moist mucous membranes, no scleral icterus, no conjunctival pallor. Heart: Regular rate and rhythm, without murmurs, rubs, gallops. Lungs: Clear to auscultation bilaterally. No wheezing, no rhonchi, no rales.  Abdomen: Soft; diffuse tenderness to light palpation; nondistended, positive bowel sounds. Extremities: No clubbing cyanosis or edema,  positive pedal pulses. Neuro: Grossly intact, nonfocal.    Lab Results:  Basic Metabolic Panel:    Component Value Date/Time   NA 130* 06/12/2011 0519   K 3.5 06/12/2011 0519   CL 99 06/12/2011 0519   CO2 25 06/12/2011 0519   BUN 14 06/12/2011 0519   CREATININE 0.47* 06/12/2011 0519   GLUCOSE 127* 06/12/2011 0519   CALCIUM 8.5 06/12/2011 0519   CBC:    Component Value Date/Time   WBC 7.8 06/12/2011 0519   HGB 9.8* 06/12/2011 0519   HCT 30.1* 06/12/2011 0519   PLT 380 06/12/2011 0519   MCV 85.3 06/12/2011 0519   NEUTROABS 5.4 06/01/2011 1110   LYMPHSABS 1.0 06/01/2011 1110   MONOABS 0.5 06/01/2011 1110   EOSABS 0.4 06/01/2011 1110   BASOSABS 0.0 06/01/2011 1110      Lab 06/12/11  0519 06/09/11 0607 06/07/11 0500  WBC 7.8 9.3 7.6  HGB 9.8* 9.9* 10.4*  HCT 30.1* 30.6* 31.6*  PLT 380 345 284  MCV 85.3 84.5 84.3  MCH 27.8 27.3 27.7  MCHC 32.6 32.4 32.9  RDW 18.2* 18.2* 18.4*  LYMPHSABS -- -- --  MONOABS -- -- --  EOSABS -- -- --  BASOSABS -- -- --  BANDABS -- -- --    Lab 06/12/11 0519 06/11/11 0420 06/10/11 0500 06/09/11 0607 06/07/11 0500 06/06/11 0858  NA 130* 129* 131* 129* 131* --  K 3.5 3.6 3.7 3.3* 3.6 --  CL 99 98 99 98 98 --  CO2 25 22 23 25 24  --  GLUCOSE 127* 151* 136* 121* 135* --  BUN 14 13 14 13 12  --  CREATININE 0.47* 0.46* 0.45* 0.46* 0.46* --  CALCIUM 8.5 8.3* 8.6 8.5 9.1 --  MG -- 1.6 1.7 1.4* -- 1.5   No results found for this basename: INR:5,PROTIME:5 in the last 168 hours Cardiac markers:  Lab 06/11/11 1250 06/11/11 0400  CKMB 1.7 2.1  TROPONINI <0.30 <0.30  MYOGLOBIN -- --   No components found with this basename: POCBNP:3 No results found for this or any previous visit (from the past 240 hour(s)).  Studies/Results: No results found.  Medications: Scheduled  Meds:   . cloNIDine  0.1 mg Transdermal Weekly  . feeding supplement  1 Container Oral TID WC  . heparin subcutaneous  5,000 Units Subcutaneous Q8H  . imipenem-cilastatin  500 mg Intravenous Q8H  . lip balm  1 application Topical BID  . metoprolol  5 mg Intravenous Q12H  . pantoprazole (PROTONIX) IV  40 mg Intravenous Q12H   Continuous Infusions:   . sodium chloride 50 mL/hr at 06/11/11 1901  . fat emulsion 240 mL (06/10/11 1744)  . fat emulsion    . TPN (CLINIMIX) +/- additives 80 mL/hr at 06/11/11 1816  . TPN (CLINIMIX) +/- additives 80 mL/hr at 06/10/11 1744  . TPN (CLINIMIX) +/- additives     PRN Meds:.acetaminophen, hydrALAZINE, LORazepam, morphine injection, phenol, sodium chloride  Assessment/Plan:  Principal Problem:  *Abdominal pain, acute - as per surgery continue to gastrostomy drain repeat CT scan [facility] June 15, 2011. Continue TPN for now  and imipenem (started 05/29/2011)  Active Problems:  Anemia - Hgb/Hct stable at present   Hyponatremia - slowly improving; Na = 130; continue to monitor   Esophagitis, erosive - continue protonix IV 40 mg Q 12 Hours  HYPERTENSION - continue metoprolol   DM - continue CBG monitoring and sliding scale insulin        LOS: 37 days   Lynesha Bango 06/12/2011, 4:25 PM

## 2011-06-12 NOTE — Progress Notes (Signed)
36 Days Post-Op  Subjective: Still c/o some abdominal pain. No better, no worse.   Objective: Vital signs in last 24 hours: Temp:  [98.9 F (37.2 C)-100.8 F (38.2 C)] 98.9 F (37.2 C) (12/14 0422) Pulse Rate:  [88-101] 88  (12/14 0422) Resp:  [20] 20  (12/14 0422) BP: (126-138)/(76-82) 138/82 mmHg (12/14 0422) SpO2:  [93 %-98 %] 93 % (12/14 0422) Weight:  [74.6 kg (164 lb 7.4 oz)] 164 lb 7.4 oz (74.6 kg) (12/14 0422) Last BM Date: 06/08/11  Intake/Output this shift:    Intake/Output Summary (Last 24 hours) at 06/12/11 1042 Last data filed at 06/12/11 0600  Gross per 24 hour  Intake    988 ml  Output   2320 ml  Net  -1332 ml     Physical Exam: BP 138/82  Pulse 88  Temp(Src) 98.9 F (37.2 C) (Oral)  Resp 20  Ht 5\' 6"  (1.676 m)  Wt 74.6 kg (164 lb 7.4 oz)  BMI 26.55 kg/m2  SpO2 93% Gen: In bed tired, no energy,  Abd: Soft, NT/ND. +BS.   Drains with small amount of serous fluid in one and purulent milky/gray fluid in other. No change. Back on straight drain. From gastrostomy Temp 99-100.8 Labs: CBC  Basename 06/12/11 0519  WBC 7.8  HGB 9.8*  HCT 30.1*  PLT 380   BMET  Basename 06/12/11 0519 06/11/11 0420  NA 130* 129*  K 3.5 3.6  CL 99 98  CO2 25 22  GLUCOSE 127* 151*  BUN 14 13  CREATININE 0.47* 0.46*  CALCIUM 8.5 8.3*   LFT  Basename 06/12/11 0519  PROT 6.1  ALBUMIN 1.8*  AST 12  ALT 7  ALKPHOS 83  BILITOT 0.3  BILIDIR --  IBILI --  LIPASE --    Assessment: Principal Problem:  *Abdominal pain, acute Active Problems:  DIABETES MELLITUS, TYPE II  HYPERTENSION  Anemia  Hyponatremia  Metabolic acidosis  Esophagitis, erosive  Acute respiratory failure   Procedure(s): EXPLORATORY LAPAROTOMY GASTROSTOMY GASTRECTOMY CHOLECYSTECTOMY  Plan: -Cont JP drain, will need to repeat CT next week.  If pockets of fluid gone can pull drains at that time.  Last CT does show some decrease in overall size of areas of fluid collection -Cont  TNA -Cont primaxin Pt still on clears.   [name] 06/12/2011

## 2011-06-12 NOTE — Progress Notes (Signed)
No significant changes.  Continue gastrostomy to drain.  Repeat CT Monday.  Wilmon Arms. Corliss Skains, MD, [facility]  06/12/2011 2:39 PM

## 2011-06-13 DIAGNOSIS — K651 Peritoneal abscess: Secondary | ICD-10-CM | POA: Diagnosis present

## 2011-06-13 LAB — GLUCOSE, CAPILLARY
Glucose-Capillary: 129 mg/dL — ABNORMAL HIGH (ref 70–99)
Glucose-Capillary: 135 mg/dL — ABNORMAL HIGH (ref 70–99)
Glucose-Capillary: 136 mg/dL — ABNORMAL HIGH (ref 70–99)
Glucose-Capillary: 137 mg/dL — ABNORMAL HIGH (ref 70–99)
Glucose-Capillary: 140 mg/dL — ABNORMAL HIGH (ref 70–99)

## 2011-06-13 LAB — COMPREHENSIVE METABOLIC PANEL
ALT: 7 U/L (ref 0–35)
AST: 12 U/L (ref 0–37)
Albumin: 1.8 g/dL — ABNORMAL LOW (ref 3.5–5.2)
CO2: 24 mEq/L (ref 19–32)
Calcium: 8.5 mg/dL (ref 8.4–10.5)
Creatinine, Ser: 0.45 mg/dL — ABNORMAL LOW (ref 0.50–1.10)
Sodium: 132 mEq/L — ABNORMAL LOW (ref 135–145)
Total Protein: 6.1 g/dL (ref 6.0–8.3)

## 2011-06-13 MED ORDER — ALTEPLASE 2 MG IJ SOLR: 2.0000 mg | Freq: Once | INTRAMUSCULAR | Status: DC

## 2011-06-13 MED ORDER — ALTEPLASE 2 MG IJ SOLR
2.0000 mg | Freq: Once | INTRAMUSCULAR | Status: AC
  Administered 2011-06-22: 2 mg
  Filled 2011-06-13: qty 2

## 2011-06-13 MED ORDER — ALTEPLASE 2 MG IJ SOLR
2.0000 mg | Freq: Once | INTRAMUSCULAR | Status: AC
  Administered 2011-06-13: 2 mg
  Filled 2011-06-13: qty 2

## 2011-06-13 MED ORDER — INSULIN REGULAR HUMAN 100 UNIT/ML IJ SOLN
INTRAVENOUS | Status: AC
  Administered 2011-06-13: 17:00:00 via INTRAVENOUS
  Filled 2011-06-13: qty 2000

## 2011-06-13 MED ORDER — POTASSIUM CHLORIDE CRYS ER 20 MEQ PO TBCR
20.0000 meq | EXTENDED_RELEASE_TABLET | Freq: Once | ORAL | Status: AC
  Administered 2011-06-13: 20 meq via ORAL

## 2011-06-13 NOTE — Progress Notes (Signed)
Patient ID: [name], female   DOB: 08-05-1931, 77 y.o.   MRN: [id number] 37 Days Post-Op  Subjective: Complains on daily pain.   Unchanged.  Tolerating po  Objective: Vital signs in last 24 hours: Temp:  [98.6 F (37 C)-100.3 F (37.9 C)] 99.7 F (37.6 C) (12/15 0500) Pulse Rate:  [84-90] 84  (12/15 0500) Resp:  [20-22] 20  (12/15 0500) BP: (124-144)/(77-79) 143/77 mmHg (12/15 0500) SpO2:  [95 %-97 %] 96 % (12/15 0500) Weight:  [174 lb 2.6 oz (79 kg)] 174 lb 2.6 oz (79 kg) (12/15 0500) Last BM Date:  (unknown)  Intake/Output this shift:    Physical Exam: BP 143/77  Pulse 84  Temp(Src) 99.7 F (37.6 C) (Oral)  Resp 20  Ht 5\' 6"  (1.676 m)  Wt 174 lb 2.6 oz (79 kg)  BMI 28.11 kg/m2  SpO2 96% Abdomen soft, midline wound clean,  Fistula site without drainage Drains in place are serous  Labs: CBC  Basename 06/12/11 0519  WBC 7.8  HGB 9.8*  HCT 30.1*  PLT 380   BMET  Basename 06/13/11 0914 06/12/11 0519  NA 132* 130*  K 3.4* 3.5  CL 100 99  CO2 24 25  GLUCOSE 126* 127*  BUN 14 14  CREATININE 0.45* 0.47*  CALCIUM 8.5 8.5   LFT  Basename 06/13/11 0914  PROT 6.1  ALBUMIN 1.8*  AST 12  ALT 7  ALKPHOS 83  BILITOT 0.2*  BILIDIR --  IBILI --  LIPASE --   PT/INR No results found for this basename: LABPROT:2,INR:2 in the last 72 hours ABG No results found for this basename: PHART:2,PCO2:2,PO2:2,HCO3:2 in the last 72 hours  Studies/Results: No results found.  Assessment: Principal Problem:  *Abdominal pain, acute Active Problems:  DIABETES MELLITUS, TYPE II  HYPERTENSION  Anemia  Hyponatremia  Metabolic acidosis  Esophagitis, erosive  Acute respiratory failure   Procedure(s): EXPLORATORY LAPAROTOMY GASTROSTOMY GASTRECTOMY CHOLECYSTECTOMY  Plan: Repeat CT scan next week Continue current care  LOS: 38 days    Deng Kemler A 06/13/2011

## 2011-06-13 NOTE — Progress Notes (Signed)
37 Days Post-Op  Subjective: Patient without acute c/o.  Objective: Vital signs in last 24 hours: Temp:  [99.7 F (37.6 C)-100.3 F (37.9 C)] 99.7 F (37.6 C) (12/15 0500) Pulse Rate:  [84-86] 84  (12/15 0500) Resp:  [20-22] 20  (12/15 0500) BP: (143-144)/(77-79) 143/77 mmHg (12/15 0500) SpO2:  [96 %-97 %] 96 % (12/15 0500) Weight:  [174 lb 2.6 oz (79 kg)] 174 lb 2.6 oz (79 kg) (12/15 0500) Last BM Date:  (unknown)  Intake/Output from previous day: 12/14 0701 - 12/15 0700 In: 2400 [I.V.:1450; TPN:950] Out: 2125 [Urine:2125] Intake/Output this shift:    Right abd drain (epigastric) intact, insertion site ok, nontender, draining purulent beige fluid, about 25 cc's in bulb now  Lab Results:   [facility] 06/12/11 0519  WBC 7.8  HGB 9.8*  HCT 30.1*  PLT 380   BMET  Basename 06/13/11 0914 06/12/11 0519  NA 132* 130*  K 3.4* 3.5  CL 100 99  CO2 24 25  GLUCOSE 126* 127*  BUN 14 14  CREATININE 0.45* 0.47*  CALCIUM 8.5 8.5    PT/INR No results found for this basename: LABPROT:2,INR:2 in the last 72 hours ABG No results found for this basename: PHART:2,PCO2:2,PO2:2,HCO3:2 in the last 72 hours  Studies/Results: Results for orders placed during the hospital encounter of 05/06/11  SURGICAL PCR SCREEN     Status: Abnormal   Collection Time   05/07/11  9:30 AM      Component Value Range Status Comment   MRSA, PCR NEGATIVE  NEGATIVE  Final    Staphylococcus aureus POSITIVE (*) NEGATIVE  Final   URINE CULTURE     Status: Normal   Collection Time   05/07/11 10:20 AM      Component Value Range Status Comment   Specimen Description URINE, RANDOM   Final    Special Requests NONE   Final    Setup Time [id number]   Final    Colony Count NO GROWTH   Final    Culture NO GROWTH   Final    Report Status 05/08/2011 FINAL   Final   CULTURE, BLOOD (ROUTINE X 2)     Status: Normal   Collection Time   05/07/11 11:20 AM      Component Value Range Status Comment   Specimen  Description BLOOD   Final    Special Requests     Final    Value: BOTTLES DRAWN AEROBIC AND ANAEROBIC 5CC RT SUBCLAVIAN VEIN   Setup Time [id number]   Final    Culture NO GROWTH 5 DAYS   Final    Report Status 05/13/2011 FINAL   Final   CULTURE, BLOOD (ROUTINE X 2)     Status: Normal   Collection Time   05/07/11 11:20 AM      Component Value Range Status Comment   Specimen Description BLOOD   Final    Special Requests     Final    Value: BOTTLES DRAWN AEROBIC AND ANAEROBIC 5CC RT SUBCLAVIAN VEIN   Setup Time [id number]   Final    Culture NO GROWTH 5 DAYS   Final    Report Status 05/13/2011 FINAL   Final   CULTURE, RESPIRATORY     Status: Normal   Collection Time   05/13/11  6:02 AM      Component Value Range Status Comment   Specimen Description ENDOTRACHEAL   Final    Special Requests NONE   Final    Gram Stain  Final    Value: FEW WBC PRESENT, PREDOMINANTLY PMN     RARE SQUAMOUS EPITHELIAL CELLS PRESENT     NO ORGANISMS SEEN   Culture NO GROWTH 2 DAYS   Final    Report Status 05/15/2011 FINAL   Final   URINE CULTURE     Status: Normal   Collection Time   05/30/11  5:34 AM      Component Value Range Status Comment   Specimen Description URINE, CATHETERIZED   Final    Special Requests NONE   Final    Setup Time [id number]   Final    Colony Count >=100,000 COLONIES/ML   Final    Culture     Final    Value: PSEUDOMONAS AERUGINOSA     ESCHERICHIA COLI   Report Status 06/02/2011 FINAL   Final    Organism ID, Bacteria PSEUDOMONAS AERUGINOSA   Final    Organism ID, Bacteria ESCHERICHIA COLI   Final   CULTURE, ROUTINE-ABSCESS     Status: Normal   Collection Time   05/31/11  1:29 PM      Component Value Range Status Comment   Specimen Description ABSCESS ABDOMEN RUQ   Final    Special Requests PATIENT ON FOLLOWING VANC, CIRPO, PRIMAXIN   Final    Gram Stain     Final    Value: NO WBC SEEN     NO SQUAMOUS EPITHELIAL CELLS SEEN     NO ORGANISMS SEEN   Culture NO GROWTH  3 DAYS   Final    Report Status 06/03/2011 FINAL   Final     Anti-infectives: Anti-infectives     Start     Dose/Rate Route Frequency Ordered Stop   06/03/11 2115   ciprofloxacin (CIPRO) IVPB 400 mg  Status:  Discontinued     Comments: Please mix with NS.      400 mg 200 mL/hr over 60 Minutes Intravenous Every 12 hours 06/03/11 2114 06/03/11 2121   05/30/11 1130   ciprofloxacin (CIPRO) IVPB 400 mg  Status:  Discontinued        400 mg 200 mL/hr over 60 Minutes Intravenous 2 times daily 05/30/11 1038 06/02/11 1618   05/29/11 2100   imipenem-cilastatin (PRIMAXIN) 500 mg in sodium chloride 0.9 % 100 mL IVPB        500 mg 200 mL/hr over 30 Minutes Intravenous Every 8 hours 05/29/11 2007     05/29/11 2100   vancomycin (VANCOCIN) 1,250 mg in sodium chloride 0.9 % 250 mL IVPB  Status:  Discontinued        1,250 mg 166.7 mL/hr over 90 Minutes Intravenous Every 24 hours 05/29/11 2007 06/03/11 1006   05/07/11 1400   imipenem-cilastatin (PRIMAXIN) 500 mg in sodium chloride 0.9 % 100 mL IVPB        500 mg 200 mL/hr over 30 Minutes Intravenous 3 times per day 05/07/11 1106 05/21/11 1359   05/07/11 1200   micafungin (MYCAMINE) 100 mg in sodium chloride 0.9 % 100 mL IVPB  Status:  Discontinued     Comments: PER PHARMACY      100 mg 100 mL/hr over 1 Hours Intravenous Every 24 hours 05/07/11 1032 05/19/11 1103   05/07/11 1200   vancomycin (VANCOCIN) 1,250 mg in sodium chloride 0.9 % 250 mL IVPB  Status:  Discontinued        1,250 mg 166.7 mL/hr over 90 Minutes Intravenous Every 24 hours 05/07/11 1106 05/19/11 1103   05/07/11 1000   micafungin (MYCAMINE)  150 mg in sodium chloride 0.9 % 100 mL IVPB  Status:  Discontinued     Comments: PER PHARMACY      150 mg 100 mL/hr over 1 Hours Intravenous Daily 05/07/11 0951 05/07/11 1029   05/07/11 0300   ertapenem (INVANZ) 1 g in sodium chloride 0.9 % 50 mL IVPB        1 g 100 mL/hr over 30 Minutes Intravenous  Once 05/07/11 0300 05/07/11 0348    05/07/11 0130   cefTRIAXone (ROCEPHIN) 1 g in dextrose 5 % 50 mL IVPB        1 g 100 mL/hr over 30 Minutes Intravenous  Once 05/07/11 0122 05/07/11 0200          Assessment/Plan: s/p epigastric abscess drainage 12/2 (hx partial gastrectomy); check f/u CT next week (last scan 12/10).   LOS: 38 days    ALLRED,D [facility] 06/13/2011

## 2011-06-13 NOTE — Progress Notes (Addendum)
Patient ID: [name], female   DOB: 30-Sep-1931, 77 y.o.   MRN: [id number] Subjective: No events overnight.   Objective:  Vital signs in last 24 hours:  Filed Vitals:   06/12/11 0422 06/12/11 1300 06/12/11 2219 06/13/11 0500  BP: 138/82 124/77 144/79 143/77  Pulse: 88 90 86 84  Temp: 98.9 F (37.2 C) 98.6 F (37 C) 100.3 F (37.9 C) 99.7 F (37.6 C)  TempSrc: Oral Oral Oral Oral  Resp: 20 20 22 20   Height:      Weight: 74.6 kg (164 lb 7.4 oz)   79 kg (174 lb 2.6 oz)  SpO2: 93% 95% 97% 96%    Intake/Output from previous day:   Intake/Output Summary (Last 24 hours) at 06/13/11 1058 Last data filed at 06/13/11 0507  Gross per 24 hour  Intake   2400 ml  Output   2125 ml  Net    275 ml    Physical Exam: General: Alert, awake, in no acute distress. HEENT: No bruits, no goiter. Moist mucous membranes, no scleral icterus, no conjunctival pallor. Heart: Regular rate and rhythm, without murmurs, rubs, gallops. Lungs: Coarse breath sounds bilaterally.  Abdomen: Soft, diffusely tender, nondistended, positive bowel sounds. Extremities: No clubbing cyanosis or edema,  positive pedal pulses. Neuro: no focal neurologic deficits    Lab Results:  Basic Metabolic Panel:    Component Value Date/Time   NA 132* 06/13/2011 0914   K 3.4* 06/13/2011 0914   CL 100 06/13/2011 0914   CO2 24 06/13/2011 0914   BUN 14 06/13/2011 0914   CREATININE 0.45* 06/13/2011 0914   GLUCOSE 126* 06/13/2011 0914   CALCIUM 8.5 06/13/2011 0914   CBC:    Component Value Date/Time   WBC 7.8 06/12/2011 0519   HGB 9.8* 06/12/2011 0519   HCT 30.1* 06/12/2011 0519   PLT 380 06/12/2011 0519   MCV 85.3 06/12/2011 0519   NEUTROABS 5.4 06/01/2011 1110   LYMPHSABS 1.0 06/01/2011 1110   MONOABS 0.5 06/01/2011 1110   EOSABS 0.4 06/01/2011 1110   BASOSABS 0.0 06/01/2011 1110      Lab 06/12/11 0519 06/09/11 0607 06/07/11 0500  WBC 7.8 9.3 7.6  HGB 9.8* 9.9* 10.4*  HCT 30.1* 30.6* 31.6*  PLT 380 345  284  MCV 85.3 84.5 84.3  MCH 27.8 27.3 27.7  MCHC 32.6 32.4 32.9  RDW 18.2* 18.2* 18.4*  LYMPHSABS -- -- --  MONOABS -- -- --  EOSABS -- -- --  BASOSABS -- -- --  BANDABS -- -- --    Lab 06/13/11 0914 06/12/11 0519 06/11/11 0420 06/10/11 0500 06/09/11 0607  NA 132* 130* 129* 131* 129*  K 3.4* 3.5 3.6 3.7 3.3*  CL 100 99 98 99 98  CO2 24 25 22 23 25   GLUCOSE 126* 127* 151* 136* 121*  BUN 14 14 13 14 13   CREATININE 0.45* 0.47* 0.46* 0.45* 0.46*  CALCIUM 8.5 8.5 8.3* 8.6 8.5  MG -- -- 1.6 1.7 1.4*   No results found for this basename: INR:5,PROTIME:5 in the last 168 hours Cardiac markers:  Lab 06/11/11 1250 06/11/11 0400  CKMB 1.7 2.1  TROPONINI <0.30 <0.30  MYOGLOBIN -- --   No components found with this basename: POCBNP:3 No results found for this or any previous visit (from the past 240 hour(s)).  Studies/Results: No results found.  Medications: Scheduled Meds:   . alteplase  2 mg Intracatheter Once  . alteplase  2 mg Intracatheter Once  . cloNIDine  0.1 mg  Transdermal Weekly  . feeding supplement  1 Container Oral TID WC  . heparin subcutaneous  5,000 Units Subcutaneous Q8H  . imipenem-cilastatin  500 mg Intravenous Q8H  . lip balm  1 application Topical BID  . metoprolol  5 mg Intravenous Q12H  . pantoprazole (PROTONIX) IV  40 mg Intravenous Q12H  . potassium chloride  20 mEq Oral Once  . DISCONTD: alteplase  2 mg Intracatheter Once   Continuous Infusions:   . sodium chloride 50 mL/hr at 06/13/11 0507  . fat emulsion 250 mL (06/13/11 0507)  . TPN (CLINIMIX) +/- additives 80 mL/hr at 06/11/11 1816  . TPN (CLINIMIX) +/- additives    . TPN (CLINIMIX) +/- additives 80 mL/hr at 06/13/11 0507   PRN Meds:.acetaminophen, hydrALAZINE, LORazepam, morphine injection, phenol, sodium chloride   Assessment/Plan:   Principal Problem:  *Abdominal pain, acute - secondary to intraabdominal abscess status post drain (05/30/11); as per surgery continue to gastrostomy  drain repeat CT scan Monday's June 15, 2011. Continue TPN for now and imipenem (started 05/29/2011)   Active Problems:   Anemia - Hgb/Hct stable at present    Hyponatremia - slowly improving; Na = 132; continue to monitor    Esophagitis, erosive - continue protonix IV 40 mg Q 12 Hours    HYPERTENSION - continue metoprolol    DM - continue CBG monitoring and sliding scale insulin      LOS: 38 days   Tully Burgo 06/13/2011, 10:58 AM

## 2011-06-13 NOTE — Progress Notes (Signed)
PARENTERAL NUTRITION CONSULT NOTE - FOLLOW UP Pharmacy Consult for TPN  Indication:  Not tolerating TF. s/p gastrectomy, gastrostomy, cholecysectomy for erosive esophagitis and perforated ulcer. Intraabdominal abscess with possible fistula (seen on UGI).  Labs:  [facility] 06/12/11 0519  WBC 7.8  HGB 9.8*  HCT 30.1*  PLT 380  APTT --  INR --     Basename 06/12/11 0519 06/11/11 0420  NA 130* 129*  K 3.5 3.6  CL 99 98  CO2 25 22  GLUCOSE 127* 151*  BUN 14 13  CREATININE 0.47* 0.46*  LABCREA -- --  CREAT24HRUR -- --  CALCIUM 8.5 8.3*  MG -- 1.6  PHOS -- 4.3  PROT 6.1 6.0  ALBUMIN 1.8* 1.9*  AST 12 13  ALT 7 7  ALKPHOS 83 84  BILITOT 0.3 0.2*  BILIDIR -- --  IBILI -- --  PREALBUMIN -- --  CHOLHDL -- --  CHOL -- --   Estimated Creatinine Clearance: 62.5 ml/min (by C-G formula based on Cr of 0.47).    Basename 06/13/11 0851 06/13/11 0432 06/13/11 0008  GLUCAP 117* 136* 129*     Insulin Requirements in the past 24 hours:  50 units regular insulin in TPN  Assessment:  77 y/o previously on TPN (for erosive esophagitis with perforated pyloric ulcer s/p lap. 11/7 with gastrostomy, gastrectomy and cholecystectomy) TPN initially started 05/12/11 - 05/14/2011. 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 TPN changed to continuous 24h infusion d/t low Na.  Gastric tube placed to gravity 12/13 with NPO status.  1. Endo - CBGs adequately controlled with 50 units insulin in TPN. 2. Lytes -no labs today 3. Renal fxn stable, inaccurate I/O's 4.  LFTs, tbili WNL   Nutritional Goals:  1785-2000 kcal and 95-110 g protein   Plan:  1.  Continue Clinimix E 5/15 at 49ml/hr. 2.  Due to national backorder, lipids, MVI, and trace elements will be given MWF only.  3.  Monitor Na+, unable to correct with pre-made Clinimix 4.  F/U diet advancement to start weaning TPN.   Mickeal Skinner 06/13/2011,9:52 AM

## 2011-06-14 LAB — COMPREHENSIVE METABOLIC PANEL
AST: 13 U/L (ref 0–37)
BUN: 13 mg/dL (ref 6–23)
CO2: 24 mEq/L (ref 19–32)
Calcium: 8.5 mg/dL (ref 8.4–10.5)
Chloride: 98 mEq/L (ref 96–112)
Creatinine, Ser: 0.44 mg/dL — ABNORMAL LOW (ref 0.50–1.10)
GFR calc Af Amer: >90 mL/min (ref >90)
GFR calc non Af Amer: >90 mL/min (ref >90)
Glucose, Bld: 136 mg/dL — ABNORMAL HIGH (ref 70–99)
Total Bilirubin: 0.2 mg/dL — ABNORMAL LOW (ref 0.3–1.2)

## 2011-06-14 LAB — GLUCOSE, CAPILLARY: Glucose-Capillary: 134 mg/dL — ABNORMAL HIGH (ref 70–99)

## 2011-06-14 MED ORDER — CLINIMIX E/DEXTROSE (5/15) 5 % IV SOLN
INTRAVENOUS | Status: AC
  Administered 2011-06-14: 17:00:00 via INTRAVENOUS
  Filled 2011-06-14: qty 2000

## 2011-06-14 MED ORDER — POTASSIUM CHLORIDE 10 MEQ/50ML IV SOLN
10.0000 meq | INTRAVENOUS | Status: AC
  Administered 2011-06-14 (×3): 10 meq via INTRAVENOUS
  Filled 2011-06-14 (×3): qty 50

## 2011-06-14 NOTE — Progress Notes (Signed)
38 Days Post-Op  Subjective: Patient sleeping soundly.  Had been complaining of abd pain to nurse  Objective: Vital signs in last 24 hours: Temp:  [99.7 F (37.6 C)-100.5 F (38.1 C)] 99.7 F (37.6 C) (12/16 0601) Pulse Rate:  [96-103] 99  (12/16 0601) Resp:  [18-22] 22  (12/16 0601) BP: (130-155)/(73-84) 155/84 mmHg (12/16 0601) SpO2:  [94 %-96 %] 96 % (12/16 0601) Last BM Date:  (unknown)  Intake/Output from previous day: 12/15 0701 - 12/16 0700 In: 2480 [I.V.:940; IV Piggyback:100; TPN:1440] Out: 3455 [Urine:3000; Drains:455] Intake/Output this shift:    Drains with some purulent drainage.  Lab Results:   [facility] 06/12/11 0519  WBC 7.8  HGB 9.8*  HCT 30.1*  PLT 380   BMET  Basename 06/14/11 0500 06/13/11 0914  NA 130* 132*  K 3.4* 3.4*  CL 98 100  CO2 24 24  GLUCOSE 136* 126*  BUN 13 14  CREATININE 0.44* 0.45*  CALCIUM 8.5 8.5   PT/INR No results found for this basename: LABPROT:2,INR:2 in the last 72 hours ABG No results found for this basename: PHART:2,PCO2:2,PO2:2,HCO3:2 in the last 72 hours  Studies/Results: No results found.  Anti-infectives: Anti-infectives     Start     Dose/Rate Route Frequency Ordered Stop   06/03/11 2115   ciprofloxacin (CIPRO) IVPB 400 mg  Status:  Discontinued     Comments: Please mix with NS.      400 mg 200 mL/hr over 60 Minutes Intravenous Every 12 hours 06/03/11 2114 06/03/11 2121   05/30/11 1130   ciprofloxacin (CIPRO) IVPB 400 mg  Status:  Discontinued        400 mg 200 mL/hr over 60 Minutes Intravenous 2 times daily 05/30/11 1038 06/02/11 1618   05/29/11 2100   imipenem-cilastatin (PRIMAXIN) 500 mg in sodium chloride 0.9 % 100 mL IVPB        500 mg 200 mL/hr over 30 Minutes Intravenous Every 8 hours 05/29/11 2007     05/29/11 2100   vancomycin (VANCOCIN) 1,250 mg in sodium chloride 0.9 % 250 mL IVPB  Status:  Discontinued        1,250 mg 166.7 mL/hr over 90 Minutes Intravenous Every 24 hours 05/29/11 2007  06/03/11 1006   05/07/11 1400   imipenem-cilastatin (PRIMAXIN) 500 mg in sodium chloride 0.9 % 100 mL IVPB        500 mg 200 mL/hr over 30 Minutes Intravenous 3 times per day 05/07/11 1106 05/21/11 1359   05/07/11 1200   micafungin (MYCAMINE) 100 mg in sodium chloride 0.9 % 100 mL IVPB  Status:  Discontinued     Comments: PER PHARMACY      100 mg 100 mL/hr over 1 Hours Intravenous Every 24 hours 05/07/11 1032 05/19/11 1103   05/07/11 1200   vancomycin (VANCOCIN) 1,250 mg in sodium chloride 0.9 % 250 mL IVPB  Status:  Discontinued        1,250 mg 166.7 mL/hr over 90 Minutes Intravenous Every 24 hours 05/07/11 1106 05/19/11 1103   05/07/11 1000   micafungin (MYCAMINE) 150 mg in sodium chloride 0.9 % 100 mL IVPB  Status:  Discontinued     Comments: PER PHARMACY      150 mg 100 mL/hr over 1 Hours Intravenous Daily 05/07/11 0951 05/07/11 1029   05/07/11 0300   ertapenem (INVANZ) 1 g in sodium chloride 0.9 % 50 mL IVPB        1 g 100 mL/hr over 30 Minutes Intravenous  Once 05/07/11  0300 05/07/11 0348   05/07/11 0130   cefTRIAXone (ROCEPHIN) 1 g in dextrose 5 % 50 mL IVPB        1 g 100 mL/hr over 30 Minutes Intravenous  Once 05/07/11 0122 05/07/11 0200          Assessment/Plan: s/p Procedure(s): EXPLORATORY LAPAROTOMY GASTROSTOMY GASTRECTOMY CHOLECYSTECTOMY Repeat CT this week to evaluate intra-abdominal abscess.  Will give enteral contrast via G-tube to outline GI anatomy  LOS: 39 days    Prentiss Polio K. 06/14/2011

## 2011-06-14 NOTE — Progress Notes (Signed)
Patient ID: [name], female   DOB: 12-24-1931, 77 y.o.   MRN: [id number] Subjective: No events overnight. Patient denies chest pain, shortness of breath but continues to complain of abdominal pain.  Objective:  Vital signs in last 24 hours:  Filed Vitals:   06/13/11 0500 06/13/11 1426 06/13/11 2122 06/14/11 0601  BP: 143/77 151/79 130/73 155/84  Pulse: 84 96 103 99  Temp: 99.7 F (37.6 C) 100 F (37.8 C) 100.5 F (38.1 C) 99.7 F (37.6 C)  TempSrc: Oral Oral Oral Oral  Resp: 20 20 18 22   Height:      Weight: 79 kg (174 lb 2.6 oz)     SpO2: 96% 94% 95% 96%    Intake/Output from previous day:   Intake/Output Summary (Last 24 hours) at 06/14/11 1048 Last data filed at 06/14/11 0700  Gross per 24 hour  Intake   2480 ml  Output   3455 ml  Net   -975 ml    Physical Exam: General: Alert, awake, in no acute distress. HEENT: No bruits, no goiter. Moist mucous membranes, no scleral icterus, no conjunctival pallor. Heart: Regular rate and rhythm, without murmurs, rubs, gallops. Lungs: Clear to auscultation bilaterally. No wheezing, no rhonchi, no rales.  Abdomen: Soft, nontender, nondistended, positive bowel sounds. Extremities: No clubbing cyanosis or edema,  positive pedal pulses. Neuro: Grossly intact, nonfocal.    Lab Results:  Basic Metabolic Panel:    Component Value Date/Time   NA 130* 06/14/2011 0500   K 3.4* 06/14/2011 0500   CL 98 06/14/2011 0500   CO2 24 06/14/2011 0500   BUN 13 06/14/2011 0500   CREATININE 0.44* 06/14/2011 0500   GLUCOSE 136* 06/14/2011 0500   CALCIUM 8.5 06/14/2011 0500   CBC:    Component Value Date/Time   WBC 7.8 06/12/2011 0519   HGB 9.8* 06/12/2011 0519   HCT 30.1* 06/12/2011 0519   PLT 380 06/12/2011 0519   MCV 85.3 06/12/2011 0519   NEUTROABS 5.4 06/01/2011 1110   LYMPHSABS 1.0 06/01/2011 1110   MONOABS 0.5 06/01/2011 1110   EOSABS 0.4 06/01/2011 1110   BASOSABS 0.0 06/01/2011 1110      Lab 06/12/11 0519 06/09/11 0607   WBC 7.8 9.3  HGB 9.8* 9.9*  HCT 30.1* 30.6*  PLT 380 345  MCV 85.3 84.5  MCH 27.8 27.3  MCHC 32.6 32.4  RDW 18.2* 18.2*  LYMPHSABS -- --  MONOABS -- --  EOSABS -- --  BASOSABS -- --  BANDABS -- --    Lab 06/14/11 0500 06/13/11 0914 06/12/11 0519 06/11/11 0420 06/10/11 0500 06/09/11 0607  NA 130* 132* 130* 129* 131* --  K 3.4* 3.4* 3.5 3.6 3.7 --  CL 98 100 99 98 99 --  CO2 24 24 25 22 23  --  GLUCOSE 136* 126* 127* 151* 136* --  BUN 13 14 14 13 14  --  CREATININE 0.44* 0.45* 0.47* 0.46* 0.45* --  CALCIUM 8.5 8.5 8.5 8.3* 8.6 --  MG -- -- -- 1.6 1.7 1.4*   No results found for this basename: INR:5,PROTIME:5 in the last 168 hours Cardiac markers:  Lab 06/11/11 1250 06/11/11 0400  CKMB 1.7 2.1  TROPONINI <0.30 <0.30  MYOGLOBIN -- --   No components found with this basename: POCBNP:3 No results found for this or any previous visit (from the past 240 hour(s)).  Studies/Results: No results found.  Medications: Scheduled Meds:   . alteplase  2 mg Intracatheter Once  . alteplase  2 mg Intracatheter  Once  . cloNIDine  0.1 mg Transdermal Weekly  . feeding supplement  1 Container Oral TID WC  . heparin subcutaneous  5,000 Units Subcutaneous Q8H  . imipenem-cilastatin  500 mg Intravenous Q8H  . lip balm  1 application Topical BID  . metoprolol  5 mg Intravenous Q12H  . pantoprazole (PROTONIX) IV  40 mg Intravenous Q12H  . potassium chloride  10 mEq Intravenous Q1 Hr x 3  . potassium chloride  20 mEq Oral Once   Continuous Infusions:   . sodium chloride 50 mL/hr at 06/14/11 0700  . fat emulsion 250 mL (06/13/11 0507)  . TPN (CLINIMIX) +/- additives 80 mL/hr at 06/14/11 0700  . TPN (CLINIMIX) +/- additives    . TPN (CLINIMIX) +/- additives 80 mL/hr at 06/13/11 0507   PRN Meds:.acetaminophen, hydrALAZINE, LORazepam, morphine injection, phenol, sodium chloride  Assessment/Plan:  Principal Problem:  *Abdominal pain, acute - secondary to intraabdominal abscess status  post drain (05/30/11); as per surgery continue to gastrostomy drain repeat CT scan Monday's June 15, 2011. Continue TPN for now and imipenem (started 05/29/2011)   Active Problems:  Anemia - Hgb/Hct stable at present   Hyponatremia - slowly improving;   Esophagitis, erosive - continue protonix IV 40 mg Q 12 Hours   HYPERTENSION - continue metoprolol   DM - continue CBG monitoring and sliding scale insulin    LOS: 39 days   [name] 06/14/2011, 10:48 AM

## 2011-06-14 NOTE — Progress Notes (Signed)
PARENTERAL NUTRITION CONSULT NOTE - FOLLOW UP Pharmacy Consult for TPN  Indication:  Not tolerating TF. s/p gastrectomy, gastrostomy, cholecysectomy for erosive esophagitis and perforated ulcer. Intraabdominal abscess with possible fistula (seen on UGI).  Labs:  [facility] 06/12/11 0519  WBC 7.8  HGB 9.8*  HCT 30.1*  PLT 380  APTT --  INR --     Basename 06/14/11 0500 06/13/11 0914 06/12/11 0519  NA 130* 132* 130*  K 3.4* 3.4* 3.5  CL 98 100 99  CO2 24 24 25   GLUCOSE 136* 126* 127*  BUN 13 14 14   CREATININE 0.44* 0.45* 0.47*  LABCREA -- -- --  CREAT24HRUR -- -- --  CALCIUM 8.5 8.5 8.5  MG -- -- --  PHOS -- -- --  PROT 6.1 6.1 6.1  ALBUMIN 1.7* 1.8* 1.8*  AST 13 12 12   ALT 6 7 7   ALKPHOS 82 83 83  BILITOT 0.2* 0.2* 0.3  BILIDIR -- -- --  IBILI -- -- --  PREALBUMIN -- -- --  CHOLHDL -- -- --  CHOL -- -- --   Estimated Creatinine Clearance: 62.5 ml/min (by C-G formula based on Cr of 0.44).    Basename 06/13/11 1949 06/13/11 1701 06/13/11 1231  GLUCAP 140* 137* 135*     Insulin Requirements in the past 24 hours:  50 units regular insulin in TPN  Assessment:  77 y/o previously on TPN (for erosive esophagitis with perforated pyloric ulcer s/p lap. 11/7 with gastrostomy, gastrectomy and cholecystectomy) TPN initially started 05/12/11 - 05/14/2011. 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 TPN changed to continuous 24h infusion d/t low Na.  Diet is clear liquids currently.  1. Endo - CBGs adequately controlled with 50 units insulin in TPN. 2. Lytes - K = 3.4 3. Renal fxn stable, inaccurate I/O's 4.  LFTs, tbili WNL   Nutritional Goals:  1785-2000 kcal and 95-110 g protein   Plan:  1.  Continue Clinimix E 5/15 at 93ml/hr. 2.  Due to national backorder, lipids, MVI, and trace elements will be given MWF only.  3.  Monitor Na+, unable to correct with pre-made Clinimix 4.   F/U diet advancement to start weaning TPN. 5.  Will order 3 K runs for K of 3.4. 6.  TPN labs 12/17.   Mickeal Skinner 06/14/2011,7:44 AM

## 2011-06-15 ENCOUNTER — Inpatient Hospital Stay (HOSPITAL_COMMUNITY): Payer: Medicare HMO

## 2011-06-15 LAB — COMPREHENSIVE METABOLIC PANEL
ALT: 8 U/L (ref 0–35)
BUN: 14 mg/dL (ref 6–23)
CO2: 25 mEq/L (ref 19–32)
Calcium: 8.5 mg/dL (ref 8.4–10.5)
Creatinine, Ser: 0.47 mg/dL — ABNORMAL LOW (ref 0.50–1.10)
GFR calc Af Amer: >90 mL/min (ref >90)
GFR calc non Af Amer: >90 mL/min (ref >90)
Glucose, Bld: 121 mg/dL — ABNORMAL HIGH (ref 70–99)
Sodium: 131 mEq/L — ABNORMAL LOW (ref 135–145)
Total Protein: 6.2 g/dL (ref 6.0–8.3)

## 2011-06-15 LAB — DIFFERENTIAL
Basophils Absolute: 0 10*3/uL (ref 0.0–0.1)
Basophils Relative: 1 % (ref 0–1)
Eosinophils Absolute: 0.2 10*3/uL (ref 0.0–0.7)
Monocytes Relative: 9 % (ref 3–12)
Neutro Abs: 3.7 10*3/uL (ref 1.7–7.7)
Neutrophils Relative %: 58 % (ref 43–77)

## 2011-06-15 LAB — TRIGLYCERIDES: Triglycerides: 115 mg/dL (ref <150)

## 2011-06-15 LAB — CBC
MCH: 27.6 pg (ref 26.0–34.0)
MCV: 85.3 fL (ref 78.0–100.0)
Platelets: 369 10*3/uL (ref 150–400)
RDW: 17.7 % — ABNORMAL HIGH (ref 11.5–15.5)
WBC: 6.3 10*3/uL (ref 4.0–10.5)

## 2011-06-15 LAB — MAGNESIUM: Magnesium: 1.4 mg/dL — ABNORMAL LOW (ref 1.5–2.5)

## 2011-06-15 MED ORDER — IOHEXOL 300 MG/ML  SOLN: 100.0000 mL | Freq: Once | INTRAMUSCULAR | Status: AC | PRN

## 2011-06-15 MED ORDER — MAGNESIUM SULFATE 40 MG/ML IJ SOLN
2.0000 g | Freq: Once | INTRAMUSCULAR | Status: AC
  Administered 2011-06-15: 2 g via INTRAVENOUS
  Filled 2011-06-15: qty 50

## 2011-06-15 MED ORDER — MAGNESIUM SULFATE IN D5W 10-5 MG/ML-% IV SOLN
1.0000 g | Freq: Once | INTRAVENOUS | Status: AC
  Administered 2011-06-15: 1 g via INTRAVENOUS
  Filled 2011-06-15: qty 100

## 2011-06-15 MED ORDER — FAT EMULSION 20 % IV EMUL
240.0000 mL | INTRAVENOUS | Status: AC
  Administered 2011-06-15: 240 mL via INTRAVENOUS
  Filled 2011-06-15: qty 250

## 2011-06-15 MED ORDER — TRACE MINERALS CR-CU-MN-SE-ZN 10-1000-500-60 MCG/ML IV SOLN
INTRAVENOUS | Status: AC
  Administered 2011-06-15: 18:00:00 via INTRAVENOUS
  Filled 2011-06-15: qty 2000

## 2011-06-15 NOTE — Progress Notes (Signed)
39 Days Post-Op  Subjective: Pt c/o abd pain. Drinking some "Resource"  Objective: Vital signs in last 24 hours: Temp:  [99.1 F (37.3 C)-99.8 F (37.7 C)] 99.1 F (37.3 C) (12/17 0504) Pulse Rate:  [89-91] 89  (12/17 0504) Resp:  [18-20] 18  (12/17 0504) BP: (128-151)/(76-85) 129/76 mmHg (12/17 0504) SpO2:  [96 %-99 %] 97 % (12/17 0504) Weight:  [166 lb 10.7 oz (75.6 kg)-174 lb 2.6 oz (79 kg)] 166 lb 10.7 oz (75.6 kg) (12/17 0504) Last BM Date:  (unknown)  Intake/Output this shift:    Physical Exam: BP 129/76  Pulse 89  Temp(Src) 99.1 F (37.3 C) (Oral)  Resp 18  Ht 5\' 6"  (1.676 m)  Wt 166 lb 10.7 oz (75.6 kg)  BMI 26.90 kg/m2  SpO2 97% Abdomen: Midline wound granulating, some purulent drainage expressed onto dressing.  G-tube intact, no erythema Perc drain still with beige purulent output JP drain intact, serous output.  Labs: CBC  Basename 06/15/11 0800  WBC 6.3  HGB 9.4*  HCT 29.1*  PLT 369   BMET  Basename 06/15/11 0800 06/14/11 0500  NA 131* 130*  K 3.8 3.4*  CL 99 98  CO2 25 24  GLUCOSE 121* 136*  BUN 14 13  CREATININE 0.47* 0.44*  CALCIUM 8.5 8.5   LFT  Basename 06/15/11 0800  PROT 6.2  ALBUMIN 1.8*  AST 14  ALT 8  ALKPHOS 81  BILITOT 0.2*  BILIDIR --  IBILI --  LIPASE --   PT/INR No results found for this basename: LABPROT:2,INR:2 in the last 72 hours ABG No results found for this basename: PHART:2,PCO2:2,PO2:2,HCO3:2 in the last 72 hours  Studies/Results: No results found.  Assessment: Principal Problem:  *Intra-abdominal abscess Active Problems:  DIABETES MELLITUS, TYPE II  HYPERTENSION  Anemia  Hyponatremia  Abdominal pain, acute  Metabolic acidosis  Esophagitis, erosive  Acute respiratory failure   Procedure(s): EXPLORATORY LAPAROTOMY GASTROSTOMY GASTRECTOMY CHOLECYSTECTOMY  Plan: Repeat CT today with contrast via G-tube. LAbs stable  LOS: 40 days    Marianna Fuss 06/15/2011

## 2011-06-15 NOTE — Progress Notes (Signed)
Seen and agree  

## 2011-06-15 NOTE — Progress Notes (Signed)
PARENTERAL NUTRITION CONSULT NOTE - FOLLOW UP  Pharmacy Consult for TPN Indication: Not tolerating TF  Allergies  Allergen Reactions  . Other Swelling    Pinto beans cause swelling and hives  . Penicillins Swelling    Patient Measurements: Height: 5\' 6"  (167.6 cm) Weight: 166 lb 10.7 oz (75.6 kg) IBW/kg (Calculated) : 59.3    Vital Signs: Temp: 99.1 F (37.3 C) (12/17 0504) Temp src: Oral (12/17 0504) BP: 130/71 mmHg (12/17 1055) Pulse Rate: 89  (12/17 1055) Intake/Output from previous day: 12/16 0701 - 12/17 0700 In: 1438 [TPN:988] Out: 2400 [Urine:2400] Intake/Output from this shift:    Labs:  Basename 06/15/11 0800  WBC 6.3  HGB 9.4*  HCT 29.1*  PLT 369  APTT --  INR --     Basename 06/15/11 0800 06/14/11 0500 06/13/11 0914  NA 131* 130* 132*  K 3.8 3.4* 3.4*  CL 99 98 100  CO2 25 24 24   GLUCOSE 121* 136* 126*  BUN 14 13 14   CREATININE 0.47* 0.44* 0.45*  LABCREA -- -- --  CREAT24HRUR -- -- --  CALCIUM 8.5 8.5 8.5  MG 1.4* -- --  PHOS 4.6 -- --  PROT 6.2 6.1 6.1  ALBUMIN 1.8* 1.7* 1.8*  AST 14 13 12   ALT 8 6 7   ALKPHOS 81 82 83  BILITOT 0.2* 0.2* 0.2*  BILIDIR -- -- --  IBILI -- -- --  PREALBUMIN -- -- --  TRIG 115 -- --  CHOLHDL -- -- --  CHOL 129 -- --   Estimated Creatinine Clearance: 61.2 ml/min (by C-G formula based on Cr of 0.47).    Basename 06/15/11 0811 06/14/11 1734 06/14/11 1227  GLUCAP 132* 134* 124*    Medications:  Scheduled:    . alteplase  2 mg Intracatheter Once  . cloNIDine  0.1 mg Transdermal Weekly  . feeding supplement  1 Container Oral TID WC  . heparin subcutaneous  5,000 Units Subcutaneous Q8H  . imipenem-cilastatin  500 mg Intravenous Q8H  . lip balm  1 application Topical BID  . magnesium sulfate 1 - 4 g bolus IVPB  2 g Intravenous Once  . metoprolol  5 mg Intravenous Q12H  . pantoprazole (PROTONIX) IV  40 mg Intravenous Q12H  . potassium chloride  10 mEq Intravenous Q1 Hr x 3    Insulin  Requirements in the past 24 hours:  50 units regular insulin in TPN SSI d/c d/t good CBG control  Current Nutrition:  Clear liquid diet TPN with Clinimix E 5/15 @ 80cc/hr, Lipids MWF- ~1600Kcal and 96 g protein daily  Assessment: 77 y/o previously on TPN (for erosive esophagitis with perforated pyloric ulcer s/p lap). S/p gastrostomy, gastrectomy and cholecystectomy 11/7.  TPN initially started 05/12/11 - 05/14/2011. 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 TPN ongoing d/t poor PO intake, changed to continuous 24h infusion d/t low Na. Diet is clear liquids currently. Noted plans to give contrast via GT today and repeat abd CT.  GI Ongoing abd pain, small PO intake of clears. Noted plans to re-CT abd today.  Endo CBG controlled 124-134, on insulin in TPN. No SSI coverage, would continue CBG checks d/t insulin in TPN (50 units/bag)  Lytes/Renal  Scr, UOP WNL. Na low, likely d/t overall fluid overload. Mg low. Other lytes ok.  Hepatobil Trig and LFTs WNL  Pulm RA  Cardiology VSS  ID Afeb, WBC WNL. Primaxin cont- all cx neg except Ecoli and PSA UTI  Px SQ hep, IV PPI    Nutritional Goals:  1785-2000 kCal, 95-110 grams of protein per day   Plan: 1. Continue Clinimix E 5/15 at 80 cc/hr- add MVI/Trace today. Supplement with IV fats today (MWF). 2. Will give 2gm IV Mg today. 3. Will f/up prealbumin in AM. No other labs ordered.  Kinser Fellman K. Allena Katz, PharmD, BCPS.  Clinical Pharmacist Pager [phone]. 06/15/2011 11:29 AM

## 2011-06-15 NOTE — Progress Notes (Signed)
Utilization Review Completed.Countess Biebel T12/17/2012   

## 2011-06-15 NOTE — Progress Notes (Signed)
Patient ID: [name], female   DOB: [birth year], 77 y.o.   MRN: [id number] Subjective: No events overnight.   Objective:  Vital signs in last 24 hours:  Filed Vitals:   06/14/11 1517 06/14/11 2203 06/15/11 0504 06/15/11 1055  BP: 151/85 128/79 129/76 130/71  Pulse: 89 91 89 89  Temp: 99.8 F (37.7 C) 99.7 F (37.6 C) 99.1 F (37.3 C)   TempSrc: Oral Oral Oral   Resp: 20 18 18    Height:      Weight:  79 kg (174 lb 2.6 oz) 75.6 kg (166 lb 10.7 oz)   SpO2: 96% 99% 97%     Intake/Output from previous day:   Intake/Output Summary (Last 24 hours) at 06/15/11 1319 Last data filed at 06/15/11 0900  Gross per 24 hour  Intake   1438 ml  Output   1550 ml  Net   -112 ml    Physical Exam: General: Alert, awake, oriented x3, in no acute distress. HEENT: No bruits, no goiter. Moist mucous membranes, no scleral icterus, no conjunctival pallor. Heart: Regular rate and rhythm, S1/S2 +, no murmurs, rubs, gallops. Lungs: Clear to auscultation bilaterally. No wheezing, no rhonchi, no rales.  Abdomen: Soft, nontender, nondistended, positive bowel sounds. Extremities: No clubbing or cyanosis, no pitting edema,  positive pedal pulses. Neuro: Grossly nonfocal.  Lab Results:  Basic Metabolic Panel:    Component Value Date/Time   NA 131* 06/15/2011 0800   K 3.8 06/15/2011 0800   CL 99 06/15/2011 0800   CO2 25 06/15/2011 0800   BUN 14 06/15/2011 0800   CREATININE 0.47* 06/15/2011 0800   GLUCOSE 121* 06/15/2011 0800   CALCIUM 8.5 06/15/2011 0800   CBC:    Component Value Date/Time   WBC 6.3 06/15/2011 0800   HGB 9.4* 06/15/2011 0800   HCT 29.1* 06/15/2011 0800   PLT 369 06/15/2011 0800   MCV 85.3 06/15/2011 0800   NEUTROABS 3.7 06/15/2011 0800   LYMPHSABS 1.9 06/15/2011 0800   MONOABS 0.6 06/15/2011 0800   EOSABS 0.2 06/15/2011 0800   BASOSABS 0.0 06/15/2011 0800      Lab 06/15/11 0800 06/12/11 0519 06/09/11 0607  WBC 6.3 7.8 9.3  HGB 9.4* 9.8* 9.9*  HCT 29.1* 30.1*  30.6*  PLT 369 380 345  MCV 85.3 85.3 84.5  MCH 27.6 27.8 27.3  MCHC 32.3 32.6 32.4  RDW 17.7* 18.2* 18.2*  LYMPHSABS 1.9 -- --  MONOABS 0.6 -- --  EOSABS 0.2 -- --  BASOSABS 0.0 -- --  BANDABS -- -- --    Lab 06/15/11 0800 06/14/11 0500 06/13/11 0914 06/12/11 0519 06/11/11 0420 06/10/11 0500 06/09/11 0607  NA 131* 130* 132* 130* 129* -- --  K 3.8 3.4* 3.4* 3.5 3.6 -- --  CL 99 98 100 99 98 -- --  CO2 25 24 24 25 22  -- --  GLUCOSE 121* 136* 126* 127* 151* -- --  BUN 14 13 14 14 13  -- --  CREATININE 0.47* 0.44* 0.45* 0.47* 0.46* -- --  CALCIUM 8.5 8.5 8.5 8.5 8.3* -- --  MG 1.4* -- -- -- 1.6 1.7 1.4*   No results found for this basename: INR:5,PROTIME:5 in the last 168 hours Cardiac markers:  Lab 06/11/11 1250 06/11/11 0400  CKMB 1.7 2.1  TROPONINI <0.30 <0.30  MYOGLOBIN -- --   No components found with this basename: POCBNP:3 No results found for this or any previous visit (from the past 240 hour(s)).  Studies/Results: No results found.  Medications:  Scheduled Meds:   . alteplase  2 mg Intracatheter Once  . cloNIDine  0.1 mg Transdermal Weekly  . feeding supplement  1 Container Oral TID WC  . heparin subcutaneous  5,000 Units Subcutaneous Q8H  . imipenem-cilastatin  500 mg Intravenous Q8H  . lip balm  1 application Topical BID  . magnesium sulfate 1 - 4 g bolus IVPB  2 g Intravenous Once  . metoprolol  5 mg Intravenous Q12H  . pantoprazole (PROTONIX) IV  40 mg Intravenous Q12H  . potassium chloride  10 mEq Intravenous Q1 Hr x 3   Continuous Infusions:   . sodium chloride 50 mL/hr at 06/15/11 0505  . fat emulsion    . TPN (CLINIMIX) +/- additives 80 mL/hr at 06/14/11 0700  . TPN (CLINIMIX) +/- additives 80 mL/hr at 06/15/11 0600  . TPN (CLINIMIX) +/- additives     PRN Meds:.acetaminophen, hydrALAZINE, LORazepam, morphine injection, phenol, sodium chloride  Assessment/Plan:  Principal Problem:  *Abdominal pain, acute - secondary to intraabdominal abscess  status post drain (05/30/11); as per surgery continue to gastrostomy drain and we will follow up repeat CT scan [facility] June 15, 2011. Continue TPN for now and imipenem (started 05/29/2011)   Active Problems:  Anemia - Hgb/Hct stable at present   Hyponatremia - slowly improving  Esophagitis, erosive - continue protonix IV 40 mg Q 12 Hours   HYPERTENSION - continue metoprolol   DM - continue CBG monitoring and sliding scale insulin      LOS: 40 days   [name] 06/15/2011, 1:19 PM  [facility] Pager: [phone]

## 2011-06-16 LAB — COMPREHENSIVE METABOLIC PANEL
Alkaline Phosphatase: 79 U/L (ref 39–117)
BUN: 14 mg/dL (ref 6–23)
Chloride: 100 mEq/L (ref 96–112)
Creatinine, Ser: 0.51 mg/dL (ref 0.50–1.10)
GFR calc Af Amer: >90 mL/min (ref >90)
Glucose, Bld: 135 mg/dL — ABNORMAL HIGH (ref 70–99)
Potassium: 3.7 mEq/L (ref 3.5–5.1)
Total Bilirubin: 0.2 mg/dL — ABNORMAL LOW (ref 0.3–1.2)

## 2011-06-16 LAB — GLUCOSE, CAPILLARY
Glucose-Capillary: 132 mg/dL — ABNORMAL HIGH (ref 70–99)
Glucose-Capillary: 122 mg/dL — ABNORMAL HIGH (ref 70–99)
Glucose-Capillary: 123 mg/dL — ABNORMAL HIGH (ref 70–99)
Glucose-Capillary: 128 mg/dL — ABNORMAL HIGH (ref 70–99)

## 2011-06-16 MED ORDER — CLINIMIX E/DEXTROSE (5/15) 5 % IV SOLN
INTRAVENOUS | Status: AC
  Administered 2011-06-16: 18:00:00 via INTRAVENOUS
  Filled 2011-06-16: qty 2000

## 2011-06-16 NOTE — Progress Notes (Signed)
Patient ID: [name], female   DOB: [birth year], 77 y.o.   MRN: [id number] Subjective: No events overnight.   Objective:  Vital signs in last 24 hours:  Filed Vitals:   06/15/11 2100 06/16/11 0645 06/16/11 1030 06/16/11 1400  BP: 101/65 125/77 135/77 128/70  Pulse: 77 88 91 86  Temp: 98.4 F (36.9 C) 100.5 F (38.1 C) 100.1 F (37.8 C) 98.1 F (36.7 C)  TempSrc: Oral Oral Oral Oral  Resp: 18 18  19   Height:      Weight:  74.9 kg (165 lb 2 oz)    SpO2: 95% 95%  94%    Intake/Output from previous day:   Intake/Output Summary (Last 24 hours) at 06/16/11 1705 Last data filed at 06/16/11 1300  Gross per 24 hour  Intake    250 ml  Output 1582.5 ml  Net -1332.5 ml    Physical Exam: General: Alert, awake, in no acute distress. HEENT: No bruits, no goiter. Moist mucous membranes, no scleral icterus, no conjunctival pallor. Heart: Regular rate and rhythm, S1/S2 +, no murmurs, rubs, gallops. Lungs: Clear to auscultation bilaterally. No wheezing, no rhonchi, no rales.  Abdomen: Soft, tender over mid and upper abdomen, nondistended, positive bowel sounds. Extremities: No clubbing or cyanosis, no pitting edema,  positive pedal pulses. Neuro: No focal neurologic deficits  Lab Results:  Basic Metabolic Panel:    Component Value Date/Time   NA 133* 06/16/2011 0644   K 3.7 06/16/2011 0644   CL 100 06/16/2011 0644   CO2 24 06/16/2011 0644   BUN 14 06/16/2011 0644   CREATININE 0.51 06/16/2011 0644   GLUCOSE 135* 06/16/2011 0644   CALCIUM 8.7 06/16/2011 0644   CBC:    Component Value Date/Time   WBC 6.3 06/15/2011 0800   HGB 9.4* 06/15/2011 0800   HCT 29.1* 06/15/2011 0800   PLT 369 06/15/2011 0800   MCV 85.3 06/15/2011 0800   NEUTROABS 3.7 06/15/2011 0800   LYMPHSABS 1.9 06/15/2011 0800   MONOABS 0.6 06/15/2011 0800   EOSABS 0.2 06/15/2011 0800   BASOSABS 0.0 06/15/2011 0800      Lab 06/15/11 0800 06/12/11 0519  WBC 6.3 7.8  HGB 9.4* 9.8*  HCT 29.1* 30.1*    PLT 369 380  MCV 85.3 85.3  MCH 27.6 27.8  MCHC 32.3 32.6  RDW 17.7* 18.2*  LYMPHSABS 1.9 --  MONOABS 0.6 --  EOSABS 0.2 --  BASOSABS 0.0 --  BANDABS -- --    Lab 06/16/11 0644 06/15/11 0800 06/14/11 0500 06/13/11 0914 06/12/11 0519 06/11/11 0420 06/10/11 0500  NA 133* 131* 130* 132* 130* -- --  K 3.7 3.8 3.4* 3.4* 3.5 -- --  CL 100 99 98 100 99 -- --  CO2 24 25 24 24 25  -- --  GLUCOSE 135* 121* 136* 126* 127* -- --  BUN 14 14 13 14 14  -- --  CREATININE 0.51 0.47* 0.44* 0.45* 0.47* -- --  CALCIUM 8.7 8.5 8.5 8.5 8.5 -- --  MG -- 1.4* -- -- -- 1.6 1.7   No results found for this basename: INR:5,PROTIME:5 in the last 168 hours Cardiac markers:  Lab 06/11/11 1250 06/11/11 0400  CKMB 1.7 2.1  TROPONINI <0.30 <0.30  MYOGLOBIN -- --   No components found with this basename: POCBNP:3 No results found for this or any previous visit (from the past 240 hour(s)).  Studies/Results: Ct Abdomen Pelvis W Contrast  06/15/2011  *RADIOLOGY REPORT*  Clinical Data:  Post Billroth II, intra abdominal  abscess, hypertension, dementia, anemia, acute abdominal pain  CT ABDOMEN AND PELVIS WITH CONTRAST  Technique:  Multidetector CT imaging of the abdomen and pelvis was performed following the standard protocol during bolus administration of intravenous contrast. Sagittal and coronal MPR images reconstructed from axial data set.  Contrast:  95 ml Omnipaque-300 IV, dilute oral contrast  Comparison: 06/08/2011  Findings: Fall Bibasilar atelectasis and small effusions. Gastrostomy tube in stomach. Percutaneous drainage catheter within the abscess collection in the anterior upper abdomen, with little change in size of of surrounding fluid collection, previously 3.8 x 1.5 cm, currently 5.0 x 1.0 cm. Additional small collection adjacent to the medial margin of the left lobe of the liver, 2.6 x 3.2 cm, not significantly changed.  Diffuse stranding of upper abdominal tissue planes little changed. Tiny low  attenuation focus within the pancreas 7 mm diameter, unchanged since earlier CT 05/29/2011. Liver, spleen, remainder of pancreas, kidneys, and adrenal glands normal appearance. Decreased gastric wall thickening versus previous study. Peritoneal dialysis catheter. Beam hardening artifacts from right hip prosthesis. Sigmoid diverticulosis.  Atrophic uterus and ovaries. Bowel loops otherwise unremarkable. Foley catheter decompresses urinary bladder. No mass, adenopathy, or new focal abscess collection. Scattered atherosclerotic calcifications. Bones diffusely demineralized. Poorly defined areas of infiltration within subcutaneous fat of the anterior abdominal wall likely reflect sites of medication injection.  IMPRESSION: The two fluid collections seen previously in the upper abdomen are little changed in overall size as impaired the previous study. Diffuse stranding of tissue planes in the upper abdomen without new/additional discrete abscess collection. Decreased wall thickening of the gastric remnant. 7 mm low attenuation nodule within the pancreas question cystic pancreatic tumor. Bibasilar effusions and atelectasis.  Sigmoid diverticulosis without evidence of diverticulitis.  Original Report Authenticated By: Lollie Marrow, M.D.    Medications: Scheduled Meds:   . alteplase  2 mg Intracatheter Once  . cloNIDine  0.1 mg Transdermal Weekly  . feeding supplement  1 Container Oral TID WC  . heparin subcutaneous  5,000 Units Subcutaneous Q8H  . imipenem-cilastatin  500 mg Intravenous Q8H  . lip balm  1 application Topical BID  . metoprolol  5 mg Intravenous Q12H  . pantoprazole (PROTONIX) IV  40 mg Intravenous Q12H   Continuous Infusions:   . sodium chloride 1,000 mL (06/16/11 1612)  . fat emulsion 240 mL (06/15/11 1734)  . TPN (CLINIMIX) +/- additives 80 mL/hr at 06/15/11 0600  . TPN (CLINIMIX) +/- additives    . TPN (CLINIMIX) +/- additives 80 mL/hr at 06/15/11 1734   PRN Meds:.acetaminophen,  hydrALAZINE, iohexol, LORazepam, morphine injection, phenol, sodium chloride  Assessment/Plan:  Principal Problem:   *Intra-abdominal abscess - status post drain 05/30/2011 - surgery is following; follow up their recommendations in regards to JP drain - JP drain with minimal output - continue imipenem  Active Problems:   Esophagitis, erosive - continue protonix  Disposition - PT/OT evaluation and social work for discharge planning, the problem is that while patient needs SNF taking care of the wound would require higher level of care and patient could not go to SNF  Advance directives - Full code    LOS: 41 days   [name] 06/16/2011, 5:05 PM  [facility] Pager: [phone]

## 2011-06-16 NOTE — Progress Notes (Signed)
40 Days Post-Op  Subjective: Rt abd drain placed 12/2 Post op gastrectomy    Objective: Vital signs in last 24 hours: Temp:  [98.4 F (36.9 C)-100.5 F (38.1 C)] 100.5 F (38.1 C) (12/18 0645) Pulse Rate:  [77-89] 88  (12/18 0645) Resp:  [18] 18  (12/18 0645) BP: (101-145)/(65-82) 125/77 mmHg (12/18 0645) SpO2:  [94 %-95 %] 95 % (12/18 0645) Weight:  [165 lb 2 oz (74.9 kg)] 165 lb 2 oz (74.9 kg) (12/18 0645) Last BM Date: 06/12/11  Intake/Output from previous day: 12/17 0701 - 12/18 0700 In: 0  Out: 3332.5 [Urine:3125; Drains:207.5] Intake/Output this shift:    PE:  100.5 temp Output of Rt JP is 5cc 12/17 10cc in JP now; milky yellow Site clean and dry;NT CT 12/17 shows no real change in abscesses  Lab Results:   Basename 06/15/11 0800  WBC 6.3  HGB 9.4*  HCT 29.1*  PLT 369   BMET  Basename 06/16/11 0644 06/15/11 0800  NA 133* 131*  K 3.7 3.8  CL 100 99  CO2 24 25  GLUCOSE 135* 121*  BUN 14 14  CREATININE 0.51 0.47*  CALCIUM 8.7 8.5   PT/INR No results found for this basename: LABPROT:2,INR:2 in the last 72 hours ABG No results found for this basename: PHART:2,PCO2:2,PO2:2,HCO3:2 in the last 72 hours  Studies/Results: Ct Abdomen Pelvis W Contrast  06/15/2011  *RADIOLOGY REPORT*  Clinical Data:  Post Billroth II, intra abdominal abscess, hypertension, dementia, anemia, acute abdominal pain  CT ABDOMEN AND PELVIS WITH CONTRAST  Technique:  Multidetector CT imaging of the abdomen and pelvis was performed following the standard protocol during bolus administration of intravenous contrast. Sagittal and coronal MPR images reconstructed from axial data set.  Contrast:  95 ml Omnipaque-300 IV, dilute oral contrast  Comparison: 06/08/2011  Findings: Fall Bibasilar atelectasis and small effusions. Gastrostomy tube in stomach. Percutaneous drainage catheter within the abscess collection in the anterior upper abdomen, with little change in size of of surrounding fluid  collection, previously 3.8 x 1.5 cm, currently 5.0 x 1.0 cm. Additional small collection adjacent to the medial margin of the left lobe of the liver, 2.6 x 3.2 cm, not significantly changed.  Diffuse stranding of upper abdominal tissue planes little changed. Tiny low attenuation focus within the pancreas 7 mm diameter, unchanged since earlier CT 05/29/2011. Liver, spleen, remainder of pancreas, kidneys, and adrenal glands normal appearance. Decreased gastric wall thickening versus previous study. Peritoneal dialysis catheter. Beam hardening artifacts from right hip prosthesis. Sigmoid diverticulosis.  Atrophic uterus and ovaries. Bowel loops otherwise unremarkable. Foley catheter decompresses urinary bladder. No mass, adenopathy, or new focal abscess collection. Scattered atherosclerotic calcifications. Bones diffusely demineralized. Poorly defined areas of infiltration within subcutaneous fat of the anterior abdominal wall likely reflect sites of medication injection.  IMPRESSION: The two fluid collections seen previously in the upper abdomen are little changed in overall size as impaired the previous study. Diffuse stranding of tissue planes in the upper abdomen without new/additional discrete abscess collection. Decreased wall thickening of the gastric remnant. 7 mm low attenuation nodule within the pancreas question cystic pancreatic tumor. Bibasilar effusions and atelectasis.  Sigmoid diverticulosis without evidence of diverticulitis.  Original Report Authenticated By: Lollie Marrow, M.D.    Anti-infectives: Anti-infectives     Start     Dose/Rate Route Frequency Ordered Stop   06/03/11 2115   ciprofloxacin (CIPRO) IVPB 400 mg  Status:  Discontinued     Comments: Please mix with NS.  400 mg 200 mL/hr over 60 Minutes Intravenous Every 12 hours 06/03/11 2114 06/03/11 2121   05/30/11 1130   ciprofloxacin (CIPRO) IVPB 400 mg  Status:  Discontinued        400 mg 200 mL/hr over 60 Minutes  Intravenous 2 times daily 05/30/11 1038 06/02/11 1618   05/29/11 2100   imipenem-cilastatin (PRIMAXIN) 500 mg in sodium chloride 0.9 % 100 mL IVPB        500 mg 200 mL/hr over 30 Minutes Intravenous Every 8 hours 05/29/11 2007     05/29/11 2100   vancomycin (VANCOCIN) 1,250 mg in sodium chloride 0.9 % 250 mL IVPB  Status:  Discontinued        1,250 mg 166.7 mL/hr over 90 Minutes Intravenous Every 24 hours 05/29/11 2007 06/03/11 1006   05/07/11 1400   imipenem-cilastatin (PRIMAXIN) 500 mg in sodium chloride 0.9 % 100 mL IVPB        500 mg 200 mL/hr over 30 Minutes Intravenous 3 times per day 05/07/11 1106 05/21/11 1359   05/07/11 1200   micafungin (MYCAMINE) 100 mg in sodium chloride 0.9 % 100 mL IVPB  Status:  Discontinued     Comments: PER PHARMACY      100 mg 100 mL/hr over 1 Hours Intravenous Every 24 hours 05/07/11 1032 05/19/11 1103   05/07/11 1200   vancomycin (VANCOCIN) 1,250 mg in sodium chloride 0.9 % 250 mL IVPB  Status:  Discontinued        1,250 mg 166.7 mL/hr over 90 Minutes Intravenous Every 24 hours 05/07/11 1106 05/19/11 1103   05/07/11 1000   micafungin (MYCAMINE) 150 mg in sodium chloride 0.9 % 100 mL IVPB  Status:  Discontinued     Comments: PER PHARMACY      150 mg 100 mL/hr over 1 Hours Intravenous Daily 05/07/11 0951 05/07/11 1029   05/07/11 0300   ertapenem (INVANZ) 1 g in sodium chloride 0.9 % 50 mL IVPB        1 g 100 mL/hr over 30 Minutes Intravenous  Once 05/07/11 0300 05/07/11 0348   05/07/11 0130   cefTRIAXone (ROCEPHIN) 1 g in dextrose 5 % 50 mL IVPB        1 g 100 mL/hr over 30 Minutes Intravenous  Once 05/07/11 0122 05/07/11 0200          Assessment/Plan: s/p Procedure(s): EXPLORATORY LAPAROTOMY GASTROSTOMY GASTRECTOMY CHOLECYSTECTOMY  Right abdominal abscess drain intact (JP) Output remains minimal but not gone CT shows no real change in abscesses Low grade temp today. Will follow  Marita Burnsed A 06/16/2011

## 2011-06-16 NOTE — Progress Notes (Signed)
40 Days Post-Op  Subjective: Pt ok. Cont c/o general abd pain No N/V  Objective: Vital signs in last 24 hours: Temp:  [98.4 F (36.9 C)-100.5 F (38.1 C)] 100.5 F (38.1 C) (12/18 0645) Pulse Rate:  [77-89] 88  (12/18 0645) Resp:  [18] 18  (12/18 0645) BP: (101-145)/(65-82) 125/77 mmHg (12/18 0645) SpO2:  [94 %-95 %] 95 % (12/18 0645) Weight:  [74.9 kg (165 lb 2 oz)] 165 lb 2 oz (74.9 kg) (12/18 0645) Last BM Date: 06/12/11  Intake/Output this shift:    Physical Exam: BP 125/77  Pulse 88  Temp(Src) 100.5 F (38.1 C) (Oral)  Resp 18  Ht 5\' 6"  (1.676 m)  Wt 74.9 kg (165 lb 2 oz)  BMI 26.65 kg/m2  SpO2 95% Abdomen; wound stable, clean, some drainage. Drains intact-purulence in perc drain JP drain with serous output.  Labs: CBC  Basename 06/15/11 0800  WBC 6.3  HGB 9.4*  HCT 29.1*  PLT 369   BMET  Basename 06/16/11 0644 06/15/11 0800  NA 133* 131*  K 3.7 3.8  CL 100 99  CO2 24 25  GLUCOSE 135* 121*  BUN 14 14  CREATININE 0.51 0.47*  CALCIUM 8.7 8.5   LFT  Basename 06/16/11 0644  PROT 6.2  ALBUMIN 1.8*  AST 16  ALT 9  ALKPHOS 79  BILITOT 0.2*  BILIDIR --  IBILI --  LIPASE --   PT/INR No results found for this basename: LABPROT:2,INR:2 in the last 72 hours ABG No results found for this basename: PHART:2,PCO2:2,PO2:2,HCO3:2 in the last 72 hours  Studies/Results: Ct Abdomen Pelvis W Contrast  06/15/2011  *RADIOLOGY REPORT*  Clinical Data:  Post Billroth II, intra abdominal abscess, hypertension, dementia, anemia, acute abdominal pain  CT ABDOMEN AND PELVIS WITH CONTRAST  Technique:  Multidetector CT imaging of the abdomen and pelvis was performed following the standard protocol during bolus administration of intravenous contrast. Sagittal and coronal MPR images reconstructed from axial data set.  Contrast:  95 ml Omnipaque-300 IV, dilute oral contrast  Comparison: 06/08/2011  Findings: Fall Bibasilar atelectasis and small effusions. Gastrostomy  tube in stomach. Percutaneous drainage catheter within the abscess collection in the anterior upper abdomen, with little change in size of of surrounding fluid collection, previously 3.8 x 1.5 cm, currently 5.0 x 1.0 cm. Additional small collection adjacent to the medial margin of the left lobe of the liver, 2.6 x 3.2 cm, not significantly changed.  Diffuse stranding of upper abdominal tissue planes little changed. Tiny low attenuation focus within the pancreas 7 mm diameter, unchanged since earlier CT 05/29/2011. Liver, spleen, remainder of pancreas, kidneys, and adrenal glands normal appearance. Decreased gastric wall thickening versus previous study. Peritoneal dialysis catheter. Beam hardening artifacts from right hip prosthesis. Sigmoid diverticulosis.  Atrophic uterus and ovaries. Bowel loops otherwise unremarkable. Foley catheter decompresses urinary bladder. No mass, adenopathy, or new focal abscess collection. Scattered atherosclerotic calcifications. Bones diffusely demineralized. Poorly defined areas of infiltration within subcutaneous fat of the anterior abdominal wall likely reflect sites of medication injection.  IMPRESSION: The two fluid collections seen previously in the upper abdomen are little changed in overall size as impaired the previous study. Diffuse stranding of tissue planes in the upper abdomen without new/additional discrete abscess collection. Decreased wall thickening of the gastric remnant. 7 mm low attenuation nodule within the pancreas question cystic pancreatic tumor. Bibasilar effusions and atelectasis.  Sigmoid diverticulosis without evidence of diverticulitis.  Original Report Authenticated By: Lollie Marrow, M.D.    Assessment:  Principal Problem:  *Intra-abdominal abscess Active Problems:  DIABETES MELLITUS, TYPE II  HYPERTENSION  Anemia  Hyponatremia  Abdominal pain, acute  Metabolic acidosis  Esophagitis, erosive  Acute respiratory failure    Procedure(s): EXPLORATORY LAPAROTOMY GASTROSTOMY GASTRECTOMY CHOLECYSTECTOMY  Plan: Need to review CT with MD and Rads/Ir, ?is new drain needed or exchange? Cont present tx.  LOS: 41 days    Marianna Fuss 06/16/2011

## 2011-06-16 NOTE — Progress Notes (Signed)
PARENTERAL NUTRITION CONSULT NOTE - FOLLOW UP  Pharmacy Consult for TPN Indication: Not tolerating TF  Allergies  Allergen Reactions  . Other Swelling    Pinto beans cause swelling and hives  . Penicillins Swelling    Patient Measurements: Height: 5\' 6"  (167.6 cm) Weight: 165 lb 2 oz (74.9 kg) IBW/kg (Calculated) : 59.3    Vital Signs: Temp: 100.5 F (38.1 C) (12/18 0645) Temp src: Oral (12/18 0645) BP: 125/77 mmHg (12/18 0645) Pulse Rate: 88  (12/18 0645) Intake/Output from previous day: 12/17 0701 - 12/18 0700 In: 0  Out: 3332.5 [Urine:3125; Drains:207.5] Intake/Output from this shift:    Labs:  Basename 06/15/11 0800  WBC 6.3  HGB 9.4*  HCT 29.1*  PLT 369  APTT --  INR --     Basename 06/16/11 0644 06/15/11 0800 06/14/11 0500  NA 133* 131* 130*  K 3.7 3.8 3.4*  CL 100 99 98  CO2 24 25 24   GLUCOSE 135* 121* 136*  BUN 14 14 13   CREATININE 0.51 0.47* 0.44*  LABCREA -- -- --  CREAT24HRUR -- -- --  CALCIUM 8.7 8.5 8.5  MG -- 1.4* --  PHOS -- 4.6 --  PROT 6.2 6.2 6.1  ALBUMIN 1.8* 1.8* 1.7*  AST 16 14 13   ALT 9 8 6   ALKPHOS 79 81 82  BILITOT 0.2* 0.2* 0.2*  BILIDIR -- -- --  IBILI -- -- --  PREALBUMIN -- 8.9* --  TRIG -- 115 --  CHOLHDL -- -- --  CHOL -- 129 --   Estimated Creatinine Clearance: 60.9 ml/min (by C-G formula based on Cr of 0.51).    Basename 06/16/11 0726 06/15/11 1731 06/15/11 1215  GLUCAP 132* 147* 133*    Medications:  Scheduled:     . alteplase  2 mg Intracatheter Once  . cloNIDine  0.1 mg Transdermal Weekly  . feeding supplement  1 Container Oral TID WC  . heparin subcutaneous  5,000 Units Subcutaneous Q8H  . imipenem-cilastatin  500 mg Intravenous Q8H  . lip balm  1 application Topical BID  . magnesium sulfate 1 - 4 g bolus IVPB  1 g Intravenous Once  . magnesium sulfate 1 - 4 g bolus IVPB  2 g Intravenous Once  . metoprolol  5 mg Intravenous Q12H  . pantoprazole (PROTONIX) IV  40 mg Intravenous Q12H     Insulin Requirements in the past 24 hours:  50 units regular insulin in TPN SSI d/c d/t good CBG control  Current Nutrition:  Clear liquid diet TPN with Clinimix E 5/15 @ 80cc/hr, Lipids MWF- ~1600Kcal and 96 g protein daily  Assessment: 77 y/o previously on TPN (for erosive esophagitis with perforated pyloric ulcer s/p lap). S/p gastrostomy, gastrectomy and cholecystectomy 11/7.  TPN initially started 05/12/11 - 05/14/2011. 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 TPN ongoing d/t poor PO intake, changed to continuous 24h infusion d/t low Na. Diet is clear liquids currently.   GI Ongoing abd pain, small PO intake of clears. Noted re-CT abd without new abscesses, old fluid collections unchanged in size.  Endo CBG controlled 124-134, on insulin in TPN. No SSI coverage, would continue CBG checks d/t insulin in TPN (50 units/bag)  Lytes/Renal  Scr, UOP WNL. Na low, likely d/t overall fluid overload. Other lytes ok.  Hepatobil Trig and LFTs WNL. Prealbumin low for goal 18-40g/dl. Slow progress expected as patient continues to have small PO intake. TPN provides goal protein and ~90% Kcals.  Would await PO progress before increasing TPN further.  Pulm RA  Cardiology VSS  ID Afeb, WBC WNL. Primaxin cont- all cx neg except Ecoli and PSA UTI  Px SQ hep, IV PPI    Nutritional Goals:  1785-2000 kCal, 95-110 grams of protein per day   Plan: 1. Continue Clinimix E 5/15 at 80 cc/hr *MVI/Trace/IV fats MWF d/t national shortage* 2. Will f/up daily.  Lucion Dilger K. Allena Katz, PharmD, BCPS.  Clinical Pharmacist Pager [phone]. 06/16/2011 10:08 AM

## 2011-06-17 LAB — GLUCOSE, CAPILLARY
Glucose-Capillary: 128 mg/dL — ABNORMAL HIGH (ref 70–99)
Glucose-Capillary: 125 mg/dL — ABNORMAL HIGH (ref 70–99)
Glucose-Capillary: 152 mg/dL — ABNORMAL HIGH (ref 70–99)

## 2011-06-17 LAB — BASIC METABOLIC PANEL
CO2: 25 mEq/L (ref 19–32)
Chloride: 98 mEq/L (ref 96–112)
GFR calc Af Amer: >90 mL/min (ref >90)
Potassium: 3.4 mEq/L — ABNORMAL LOW (ref 3.5–5.1)
Sodium: 131 mEq/L — ABNORMAL LOW (ref 135–145)

## 2011-06-17 LAB — CBC
Platelets: 365 10*3/uL (ref 150–400)
RBC: 3.57 MIL/uL — ABNORMAL LOW (ref 3.87–5.11)
RDW: 17.3 % — ABNORMAL HIGH (ref 11.5–15.5)
WBC: 5.4 10*3/uL (ref 4.0–10.5)

## 2011-06-17 MED ORDER — FAT EMULSION 20 % IV EMUL
240.0000 mL | INTRAVENOUS | Status: AC
  Administered 2011-06-17: 240 mL via INTRAVENOUS
  Filled 2011-06-17: qty 250

## 2011-06-17 MED ORDER — POTASSIUM CHLORIDE 10 MEQ/50ML IV SOLN
10.0000 meq | INTRAVENOUS | Status: AC
  Administered 2011-06-17 (×4): 10 meq via INTRAVENOUS
  Filled 2011-06-17 (×4): qty 50

## 2011-06-17 MED ORDER — TRACE MINERALS CR-CU-MN-SE-ZN 10-1000-500-60 MCG/ML IV SOLN
INTRAVENOUS | Status: AC
  Administered 2011-06-17: 18:00:00 via INTRAVENOUS
  Filled 2011-06-17: qty 2000

## 2011-06-17 NOTE — Progress Notes (Signed)
Seen and agree  

## 2011-06-17 NOTE — Progress Notes (Signed)
Nutrition Follow-up  Diet Order:  Clear Liquids with Resource Breeze TID.  Patient still c/o abdominal pain; PO intake of Clear Liquids is minimal, poor appetite.  Patient states that she drinks Resource supplement BID.    Patient is receiving TPN with Clinimix E 5/15 @ 80 ml/hr.  Lipids (20% IVFE @ 10 ml/hr), multivitamins, and trace elements are provided 3 times weekly (MWF) due to national backorder.  Provides 1569 kcal and 96 grams protein daily (based on weekly average).  Meets 88% minimum estimated kcal and 100% minimum estimated protein needs.   Meds: Scheduled Meds:   . alteplase  2 mg Intracatheter Once  . cloNIDine  0.1 mg Transdermal Weekly  . feeding supplement  1 Container Oral TID WC  . imipenem-cilastatin  500 mg Intravenous Q8H  . lip balm  1 application Topical BID  . metoprolol  5 mg Intravenous Q12H  . pantoprazole (PROTONIX) IV  40 mg Intravenous Q12H  . potassium chloride  10 mEq Intravenous Q1 Hr x 4  . DISCONTD: heparin subcutaneous  5,000 Units Subcutaneous Q8H   Continuous Infusions:   . sodium chloride 1,000 mL (06/16/11 1612)  . fat emulsion 240 mL (06/15/11 1734)  . fat emulsion    . TPN (CLINIMIX) +/- additives 80 mL/hr at 06/16/11 1742  . TPN (CLINIMIX) +/- additives 80 mL/hr at 06/15/11 1734  . TPN (CLINIMIX) +/- additives     PRN Meds:.acetaminophen, hydrALAZINE, LORazepam, morphine injection, phenol, sodium chloride  Labs:  CMP     Component Value Date/Time   NA 131* 06/17/2011 0540   K 3.4* 06/17/2011 0540   CL 98 06/17/2011 0540   CO2 25 06/17/2011 0540   GLUCOSE 127* 06/17/2011 0540   BUN 13 06/17/2011 0540   CREATININE 0.47* 06/17/2011 0540   CALCIUM 8.7 06/17/2011 0540   PROT 6.2 06/16/2011 0644   ALBUMIN 1.8* 06/16/2011 0644   AST 16 06/16/2011 0644   ALT 9 06/16/2011 0644   ALKPHOS 79 06/16/2011 0644   BILITOT 0.2* 06/16/2011 0644   GFRNONAA >90 06/17/2011 0540   GFRAA >90 06/17/2011 0540     Intake/Output Summary (Last 24  hours) at 06/17/11 0957 Last data filed at 06/17/11 0900  Gross per 24 hour  Intake    253 ml  Output   2125 ml  Net  -1872 ml    Weight Status:  72.7 kg down slightly from 74.6 kg on 12/12  Nutrition Dx:  Inadequate oral intake, ongoing.  Goal: PO intake plus TPN to meet 100% of minimum estimated protein needs, maximize energy provision as able during national lipid backorder, met.  Monitor: PO intake, labs, weight, I/O's  Intervention:  Continue to offer Resource Breeze PO TID to maximize oral intake.  [name] Pager #:  [phone]

## 2011-06-17 NOTE — Progress Notes (Signed)
PARENTERAL NUTRITION CONSULT NOTE - FOLLOW UP  Pharmacy Consult for TPN Indication: Not tolerating TF  Allergies  Allergen Reactions  . Other Swelling    Pinto beans cause swelling and hives  . Penicillins Swelling    Patient Measurements: Height: 5\' 6"  (167.6 cm) Weight: 160 lb 4.4 oz (72.7 kg) IBW/kg (Calculated) : 59.3    Vital Signs: Temp: 98.4 F (36.9 C) (12/19 0530) Temp src: Oral (12/19 0530) BP: 136/70 mmHg (12/19 0530) Pulse Rate: 86  (12/19 0530) Intake/Output from previous day: 12/18 0701 - 12/19 0700 In: 250  Out: 2125 [Urine:1950; Drains:175] Intake/Output from this shift:    Labs:  [facility] 06/17/11 0540 06/15/11 0800  WBC 5.4 6.3  HGB 9.8* 9.4*  HCT 30.4* 29.1*  PLT 365 369  APTT -- --  INR -- --     Basename 06/17/11 0540 06/16/11 0644 06/15/11 0800  NA 131* 133* 131*  K 3.4* 3.7 3.8  CL 98 100 99  CO2 25 24 25   GLUCOSE 127* 135* 121*  BUN 13 14 14   CREATININE 0.47* 0.51 0.47*  LABCREA -- -- --  CREAT24HRUR -- -- --  CALCIUM 8.7 8.7 8.5  MG -- -- 1.4*  PHOS -- -- 4.6  PROT -- 6.2 6.2  ALBUMIN -- 1.8* 1.8*  AST -- 16 14  ALT -- 9 8  ALKPHOS -- 79 81  BILITOT -- 0.2* 0.2*  BILIDIR -- -- --  IBILI -- -- --  PREALBUMIN -- -- 8.9*  TRIG -- -- 115  CHOLHDL -- -- --  CHOL -- -- 129   Estimated Creatinine Clearance: 60.2 ml/min (by C-G formula based on Cr of 0.47).    Basename 06/16/11 2301 06/16/11 1723 06/16/11 1158  GLUCAP 128* 123* 122*    Medications:  Scheduled:     . alteplase  2 mg Intracatheter Once  . cloNIDine  0.1 mg Transdermal Weekly  . feeding supplement  1 Container Oral TID WC  . imipenem-cilastatin  500 mg Intravenous Q8H  . lip balm  1 application Topical BID  . metoprolol  5 mg Intravenous Q12H  . pantoprazole (PROTONIX) IV  40 mg Intravenous Q12H  . DISCONTD: heparin subcutaneous  5,000 Units Subcutaneous Q8H    Insulin Requirements in the past 24 hours:  50 units regular insulin in TPN SSI d/c  d/t good CBG control  Current Nutrition:  Clear liquid diet TPN with Clinimix E 5/15 @ 80cc/hr, Lipids MWF- ~1600Kcal and 96 g protein daily  Assessment: 77 y/o previously on TPN (for erosive esophagitis with perforated pyloric ulcer s/p lap). S/p gastrostomy, gastrectomy and cholecystectomy 11/7.  TPN initially started 05/12/11 - 05/14/2011. 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 TPN ongoing d/t poor PO intake, changed to continuous 24h infusion d/t low Na. Diet is clear liquids currently.   GI Ongoing abd pain, small PO intake of clears. Noted re-CT abd without new abscesses, old fluid collections unchanged in size- noted plans pending for possible new drain vs change of current drain.  Endo CBG controlled 122-132, on insulin in TPN. No SSI coverage, would continue CBG checks d/t insulin in TPN (50 units/bag)  Lytes/Renal  Scr, UOP WNL. Na low, likely d/t overall fluid overload. K low. Acid-based status ok.  Hepatobil Trig and LFTs WNL. Prealbumin low for goal 18-40g/dl. Slow progress expected as patient continues to have small PO intake. TPN provides goal protein and ~90% Kcals. Would await PO progress before increasing TPN  further.  Pulm RA  Cardiology VSS  ID Afeb, WBC WNL. Primaxin#20 cont- all cx neg except Ecoli and PSA UTI 12/1- ? D/c Primaxin or chg to Cipro (PSA was sens), continued therapy does not seem warranted- WBC WNL, pt afebrile. Abscesses unchanged.  Px SQ hep, IV PPI    Nutritional Goals:  1785-2000 kCal, 95-110 grams of protein per day   Plan: 1. Continue Clinimix E 5/15 at 80 cc/hr *MVI/Trace/IV fats MWF d/t national shortage* 2. Will f/up AM labs. 3. Will give 4 runs of KCL now. 4. MD please address abx duration.  Drishti Pepperman K. Allena Katz, PharmD, BCPS.  Clinical Pharmacist Pager [phone]. 06/17/2011 7:53 AM

## 2011-06-17 NOTE — Progress Notes (Signed)
Physical Therapy Re-Evaluation [name] MRN: [id number] DOB: 1931/11/29 Today's Date: 06/17/2011  Problem List:  Patient Active Problem List  Diagnoses  . DIABETES MELLITUS, TYPE II  . HYPERLIPIDEMIA  . Obesity, unspecified  . DEPRESSION  . HYPERTENSION  . DIVERTICULOSIS OF COLON  . ACUTE CYSTITIS  . HEMATOMA  . Anemia  . Hyponatremia  . H pylori ulcer  . Ulcer, colon  . Acute renal failure  . S/P exploratory laparotomy  . Fever, postprocedural  . Confusion  . Abdominal pain, acute  . Metabolic acidosis  . Esophagitis, erosive  . Acute respiratory failure  . Intra-abdominal abscess    Past Medical History:  Past Medical History  Diagnosis Date  . Hypertension   . Reflux   . Right elbow pain     OTIF  . Dementia   . Depression   . Osteoarthritis   . Pancreatitis 11/2007    [facility]  . Angiomyolipoma of kidney     right  . Ulcerative esophagitis 12/05/2007    hx elevated gastrin, severe on EGD by Dr Jena Gauss , h pylori negative  . Hiatal hernia   . S/P colonoscopy 2009    pt reports normal by Dr Lovell Sheehan  . Diabetes mellitus   . Anemia   . Hyponatremia    Past Surgical History:  Past Surgical History  Procedure Date  . Orif right hip 1999    [facility]  . Umbilical hernia repair 55 months old    [place]  . Esophagogastroduodenoscopy 01/28/2011    Procedure: ESOPHAGOGASTRODUODENOSCOPY (EGD);  Surgeon: Arlyce Harman, MD;  Location: [facility];  Service: Endoscopy;  Laterality: N/A;  . Colonoscopy 01/28/2011    Procedure: COLONOSCOPY;  Surgeon: Arlyce Harman, MD;  Location: [facility];  Service: Endoscopy;  Laterality: N/A;  . Laparotomy 02/04/2011    Procedure: EXPLORATORY LAPAROTOMY;  Surgeon: Fabio Bering;  Location: [facility] ORS;  Service: General;  Laterality: N/A;  . Laparotomy 05/07/2011    Procedure: EXPLORATORY LAPAROTOMY;  Surgeon: Ardeth Sportsman, MD;  Location: [facility] OR;  Service: General;  Laterality: N/A;  lysis of adhesions  .  Gastrostomy 05/07/2011    Procedure: GASTROSTOMY;  Surgeon: Ardeth Sportsman, MD;  Location: [facility] OR;  Service: General;  Laterality: N/A;  g-tube / j -tube placement  . Gastrectomy 05/07/2011    Procedure: GASTRECTOMY;  Surgeon: Ardeth Sportsman, MD;  [facility] OR;  Service: General;  Laterality: N/A;  Partial gastrectomy  . Cholecystectomy 05/07/2011    Procedure: CHOLECYSTECTOMY;  Surgeon: Ardeth Sportsman, MD;  Location: [facility] OR;  Service: General;  Laterality: N/A;  open    PT Assessment/Plan/Recommendation PT Assessment Clinical Impression Statement: PT had signed off with pt per policy however asked for a re-evaluation secondary to prolonged medial course. Pt currently requires +2 Total assist (pt= 20-40%) for bed and transfer mobility. Pt will benefit from acute PT to maximize mobility and promote independence. Pt will benefit from SNF for rehabilitation and strengthening to maximize independence. Pt will be unable to return home safely at this point.   PT Recommendation/Assessment: Patient will need skilled PT in the acute care venue PT Problem List: Decreased strength;Decreased activity tolerance;Decreased mobility;Decreased knowledge of use of DME;Decreased cognition;Pain Barriers to Discharge: Decreased caregiver support PT Therapy Diagnosis : Difficulty walking;Generalized weakness;Altered mental status;Acute pain PT Plan PT Frequency: Min 3X/week PT Treatment/Interventions: Gait training;DME instruction;Functional mobility training;Therapeutic activities;Therapeutic exercise;Patient/family education PT Recommendation Follow Up Recommendations: Skilled nursing facility Equipment Recommended:  Defer to next venue PT Goals  Acute Rehab PT Goals PT Goal Formulation: With patient Time For Goal Achievement: 2 weeks Pt will go Supine/Side to Sit: with min assist PT Goal: Supine/Side to Sit - Progress: Progressing toward goal Pt will go Sit to Supine/Side: with min assist PT Goal: Sit to  Supine/Side - Progress: Progressing toward goal Pt will Transfer Bed to Chair/Chair to Bed: with mod assist PT Transfer Goal: Bed to Chair/Chair to Bed - Progress: Progressing toward goal Pt will Ambulate: 1 - 15 feet;with mod assist;with rolling walker PT Goal: Ambulate - Progress: Progressing toward goal  PT Evaluation Precautions/Restrictions  Precautions Precautions: Fall Precaution Comments: Significant weakness Prior Functioning  Home Living Lives With: Alone Receives Help From: Family;Personal care attendant Type of Home: Apartment Home Layout: One level Home Access: Level entry Home Adaptive Equipment: Walker - rolling;Straight cane Additional Comments: Pt. very confused, and unable to answer, or clarify information.  Pt. perseverating  on 3x/day when in regards to personal care aid. Prior Function Level of Independence: Independent with gait;Needs assistance with ADLs;Needs assistance with homemaking Driving: No Cognition Cognition Orientation Level: Oriented to person;Oriented to situation;Disoriented to place;Disoriented to time Sensation/Coordination Sensation Light Touch: Appears Intact Extremity Assessment RLE Assessment RLE Assessment: Exceptions to [facility] RLE AROM (degrees) Overall AROM Right Lower Extremity: Within functional limits for tasks assessed RLE Strength RLE Overall Strength: Deficits RLE Overall Strength Comments: Generalized deconditioning, grossly >/= 3/5 LLE Assessment LLE Assessment: Exceptions to WFL LLE AROM (degrees) Overall AROM Left Lower Extremity: Within functional limits for tasks assessed LLE Strength LLE Overall Strength: Deficits LLE Overall Strength Comments: Generalized deconditioning, grossly >/= 3/5 Mobility (including Balance) Bed Mobility Bed Mobility: Yes Supine to Sit: With rails;1: +2 Total assist;Patient percentage (comment) (pt= 40%) Supine to Sit Details (indicate cue type and reason): Verbal/tactile cues for  sequencing. Assist secondary to weakness Sitting - Scoot to Edge of Bed: 3: Mod assist Sitting - Scoot to Edge of Bed Details (indicate cue type and reason): Assist with pad, verbal cues for contribution Sit to Supine - Right: 1: +2 Total assist;Patient percentage (comment) (pt = 20%) Sit to Supine - Right Details (indicate cue type and reason): Verbal cues for sequence, pain modulation Transfers Transfers: Yes Sit to Stand: 1: +2 Total assist;Patient percentage (comment);From bed (p=30%) Sit to Stand Details (indicate cue type and reason): Verbal cues for sequence, bodymechanics, use of RW in standing.  Performed 3x.  Stand to Sit: 1: +2 Total assist;Patient percentage (comment);To bed (p=20%) Stand to Sit Details: Verbal cues to contribute to control of descent, pt giving way.   Static Sitting Balance Static Sitting - Balance Support: Bilateral upper extremity supported;Feet supported Static Sitting - Level of Assistance: 4: Min assist Static Sitting - Comment/# of Minutes: Min assist as occasionally with posterior lean. ~8-10 min Exercise  General Exercises - Lower Extremity Ankle Circles/Pumps: AROM;Both;15 reps;Supine Long Arc Quad: Seated;Both;AROM;5 reps Heel Slides: AAROM;Both;15 reps;Supine End of Session PT - End of Session Equipment Utilized During Treatment:  (No belt secondary to incision) Activity Tolerance: Patient limited by pain Patient left: with call bell in reach;in bed (pt refusing OOB to chair. ) Nurse Communication: Mobility status for transfers General Behavior During Session: [facility] for tasks performed Cognition: Impaired (Pt easily distracted, keeps stating "oh Lord, help me")  [name] 06/17/2011, 2:00 PM  [name]) [name] PT, DPT Acute Rehabilitation [phone]

## 2011-06-17 NOTE — Progress Notes (Signed)
41 Days Post-Op  Subjective: Pt ok. MIld abd soreness. No N/V Reviewed CT, fluid collections essentially unchanged, no larger, no evidence of extravasation.  Objective: Vital signs in last 24 hours: Temp:  [98.1 F (36.7 C)-100.1 F (37.8 C)] 98.4 F (36.9 C) (12/19 0530) Pulse Rate:  [86-100] 86  (12/19 0530) Resp:  [18-20] 18  (12/19 0530) BP: (126-136)/(70-77) 136/70 mmHg (12/19 0530) SpO2:  [94 %-97 %] 95 % (12/19 0530) Weight:  [72.7 kg (160 lb 4.4 oz)] 160 lb 4.4 oz (72.7 kg) (12/19 0530) Last BM Date: 06/12/11  Intake/Output this shift:    Physical Exam: BP 136/70  Pulse 86  Temp(Src) 98.4 F (36.9 C) (Oral)  Resp 18  Ht 5\' 6"  (1.676 m)  Wt 72.7 kg (160 lb 4.4 oz)  BMI 25.87 kg/m2  SpO2 95% Abdomen; wound stable, clean, some drainage.  Drains intact-purulence in perc drain  JP drain with serous output.    Labs: CBC  Basename 06/17/11 0540 06/15/11 0800  WBC 5.4 6.3  HGB 9.8* 9.4*  HCT 30.4* 29.1*  PLT 365 369   BMET  Basename 06/17/11 0540 06/16/11 0644  NA 131* 133*  K 3.4* 3.7  CL 98 100  CO2 25 24  GLUCOSE 127* 135*  BUN 13 14  CREATININE 0.47* 0.51  CALCIUM 8.7 8.7   LFT  Basename 06/16/11 0644  PROT 6.2  ALBUMIN 1.8*  AST 16  ALT 9  ALKPHOS 79  BILITOT 0.2*  BILIDIR --  IBILI --  LIPASE --   PT/INR No results found for this basename: LABPROT:2,INR:2 in the last 72 hours ABG No results found for this basename: PHART:2,PCO2:2,PO2:2,HCO3:2 in the last 72 hours  Studies/Results: Ct Abdomen Pelvis W Contrast  06/15/2011  *RADIOLOGY REPORT*  Clinical Data:  Post Billroth II, intra abdominal abscess, hypertension, dementia, anemia, acute abdominal pain  CT ABDOMEN AND PELVIS WITH CONTRAST  Technique:  Multidetector CT imaging of the abdomen and pelvis was performed following the standard protocol during bolus administration of intravenous contrast. Sagittal and coronal MPR images reconstructed from axial data set.  Contrast:  95 ml  Omnipaque-300 IV, dilute oral contrast  Comparison: 06/08/2011  Findings: Fall Bibasilar atelectasis and small effusions. Gastrostomy tube in stomach. Percutaneous drainage catheter within the abscess collection in the anterior upper abdomen, with little change in size of of surrounding fluid collection, previously 3.8 x 1.5 cm, currently 5.0 x 1.0 cm. Additional small collection adjacent to the medial margin of the left lobe of the liver, 2.6 x 3.2 cm, not significantly changed.  Diffuse stranding of upper abdominal tissue planes little changed. Tiny low attenuation focus within the pancreas 7 mm diameter, unchanged since earlier CT 05/29/2011. Liver, spleen, remainder of pancreas, kidneys, and adrenal glands normal appearance. Decreased gastric wall thickening versus previous study. Peritoneal dialysis catheter. Beam hardening artifacts from right hip prosthesis. Sigmoid diverticulosis.  Atrophic uterus and ovaries. Bowel loops otherwise unremarkable. Foley catheter decompresses urinary bladder. No mass, adenopathy, or new focal abscess collection. Scattered atherosclerotic calcifications. Bones diffusely demineralized. Poorly defined areas of infiltration within subcutaneous fat of the anterior abdominal wall likely reflect sites of medication injection.  IMPRESSION: The two fluid collections seen previously in the upper abdomen are little changed in overall size as impaired the previous study. Diffuse stranding of tissue planes in the upper abdomen without new/additional discrete abscess collection. Decreased wall thickening of the gastric remnant. 7 mm low attenuation nodule within the pancreas question cystic pancreatic tumor. Bibasilar effusions and atelectasis.  Sigmoid diverticulosis without evidence of diverticulitis.  Original Report Authenticated By: Lollie Marrow, M.D.    Assessment: Principal Problem:  *Intra-abdominal abscess Active Problems:  DIABETES MELLITUS, TYPE II  HYPERTENSION   Anemia  Hyponatremia  Abdominal pain, acute  Metabolic acidosis  Esophagitis, erosive  Acute respiratory failure   Procedure(s): EXPLORATORY LAPAROTOMY GASTROSTOMY GASTRECTOMY CHOLECYSTECTOMY  Plan: Will give trial of clear liquids and monitor drain output.  LOS: 42 days    [name] 06/17/2011

## 2011-06-17 NOTE — Plan of Care (Signed)
Problem: Phase II Progression Outcomes Goal: Progress activities as ordered Outcome: Not Progressing Pt. Very confused during OT eval - limited ability to participate.

## 2011-06-17 NOTE — Progress Notes (Signed)
Occupational Therapy Evaluation [name] MRN: [id number] DOB: Nov 30, 1931 Today's Date: 06/17/2011  Problem List:  Patient Active Problem List  Diagnoses  . DIABETES MELLITUS, TYPE II  . HYPERLIPIDEMIA  . Obesity, unspecified  . DEPRESSION  . HYPERTENSION  . DIVERTICULOSIS OF COLON  . ACUTE CYSTITIS  . HEMATOMA  . Anemia  . Hyponatremia  . H pylori ulcer  . Ulcer, colon  . Acute renal failure  . S/P exploratory laparotomy  . Fever, postprocedural  . Confusion  . Abdominal pain, acute  . Metabolic acidosis  . Esophagitis, erosive  . Acute respiratory failure  . Intra-abdominal abscess    Past Medical History:  Past Medical History  Diagnosis Date  . Hypertension   . Reflux   . Right elbow pain     OTIF  . Dementia   . Depression   . Osteoarthritis   . Pancreatitis 11/2007    [facility]  . Angiomyolipoma of kidney     right  . Ulcerative esophagitis 12/05/2007    hx elevated gastrin, severe on EGD by Dr Jena Gauss , h pylori negative  . Hiatal hernia   . S/P colonoscopy 2009    pt reports normal by Dr Lovell Sheehan  . Diabetes mellitus   . Anemia   . Hyponatremia    Past Surgical History:  Past Surgical History  Procedure Date  . Orif right hip 1999    [facility]  . Umbilical hernia repair 82 months old    [place]  . Esophagogastroduodenoscopy 01/28/2011    Procedure: ESOPHAGOGASTRODUODENOSCOPY (EGD);  Surgeon: Arlyce Harman, MD;  Location: [facility];  Service: Endoscopy;  Laterality: N/A;  . Colonoscopy 01/28/2011    Procedure: COLONOSCOPY;  Surgeon: Arlyce Harman, MD;  Location: [facility];  Service: Endoscopy;  Laterality: N/A;  . Laparotomy 02/04/2011    Procedure: EXPLORATORY LAPAROTOMY;  Surgeon: Fabio Bering;  Location: [facility];  Service: General;  Laterality: N/A;  . Laparotomy 05/07/2011    Procedure: EXPLORATORY LAPAROTOMY;  Surgeon: Ardeth Sportsman, MD;  Location: [facility] OR;  Service: General;  Laterality: N/A;  lysis of adhesions  .  Gastrostomy 05/07/2011    Procedure: GASTROSTOMY;  Surgeon: Ardeth Sportsman, MD;  Location: [facility];  Service: General;  Laterality: N/A;  g-tube / j -tube placement  . Gastrectomy 05/07/2011    Procedure: GASTRECTOMY;  Surgeon: Ardeth Sportsman, MD;  Location: [facility] OR;  Service: General;  Laterality: N/A;  Partial gastrectomy  . Cholecystectomy 05/07/2011    Procedure: CHOLECYSTECTOMY;  Surgeon: Ardeth Sportsman, MD;  Location: [facility] OR;  Service: General;  Laterality: N/A;  open    OT Assessment/Plan/Recommendation OT Assessment Clinical Impression Statement: This 77 y.o. female presents to OT with significant confustion, inability to consistently follow one step commands, focused attention, oriented to person only.  Pt. required mod-max assist for all bed mobility, min guard assist to maintain EOB sitting, and resisted all attempts to stand.  Pt. required total assistance for simple ADL tasks.  Able to initiate briefly with max cues, but unable to complete activity.  Patient will need 24 hour assistance at d/c - likely will need SNF OT Recommendation/Assessment: Patient will need skilled OT in the acute care venue OT Problem List: Decreased strength;Impaired balance (sitting and/or standing);Decreased activity tolerance;Decreased cognition Barriers to Discharge: Decreased caregiver support OT Therapy Diagnosis : Generalized weakness;Cognitive deficits OT Plan OT Frequency: Min 1X/week OT Recommendation Follow Up Recommendations: Skilled nursing facility Equipment Recommended: Defer to  next venue Individuals Consulted Consulted and Agree with Results and Recommendations: Patient unable/family or caregiver not available OT Goals Acute Rehab OT Goals OT Goal Formulation: Patient unable to participate in goal setting Time For Goal Achievement: 2 weeks ADL Goals Pt Will Perform Grooming: with mod assist;Unsupported;Sitting, edge of bed ADL Goal: Grooming - Progress: Not met Pt Will Perform Upper Body  Bathing: with mod assist;Unsupported;Sitting, edge of bed ADL Goal: Upper Body Bathing - Progress: Not met Pt Will Transfer to Toilet: with max assist;3-in-1 ADL Goal: Toilet Transfer - Progress: Not met Additional ADL Goal #1: Pt. will sustain attention x 5 mins. to complete simple ADL task ADL Goal: Additional Goal #1 - Progress: Not met  OT Evaluation Precautions/Restrictions  Precautions Precautions: Fall Precaution Comments: Significant weakness Restrictions Weight Bearing Restrictions: No Prior Functioning Home Living Lives With: Alone Receives Help From: Family;Personal care attendant Type of Home: Apartment Home Layout: One level Home Access: Level entry Home Adaptive Equipment: Walker - rolling;Straight cane Additional Comments: Pt. very confused, and unable to answer, or clarify information.  Pt. perseverating  on 3x/day when in regards to personal care aid. Prior Function Level of Independence: Independent with gait;Needs assistance with ADLs;Needs assistance with homemaking Driving: No ADL ADL Eating/Feeding: Simulated;+1 Total assistance Where Assessed - Eating/Feeding: Bed level Grooming: Performed;Wash/dry hands;Wash/dry face;+1 Total assistance Where Assessed - Grooming: Unsupported;Sitting, bed Upper Body Bathing: Simulated;+1 Total assistance Where Assessed - Upper Body Bathing: Unsupported;Sitting, bed Lower Body Bathing: Simulated;+1 Total assistance Where Assessed - Lower Body Bathing: Rolling right and/or left Upper Body Dressing: Simulated;+1 Total assistance Where Assessed - Upper Body Dressing: Unsupported;Sitting, bed Lower Body Dressing: Simulated;+1 Total assistance Where Assessed - Lower Body Dressing: Rolling right and/or left Toilet Transfer: Not assessed (Pt. resisted sit to stand) Toilet Transfer Method: Not assessed ADL Comments: Pt. moved to EOB with mod A and max cues.  Pt. sat EOB wtih min guard assist.  Pt. very confused.  Follows only  ~15%  one step commands.  Attempted to engage pt. in grooming tasks.  With max cues, pt. would initiate activity, but unable to complete Vision/Perception    Cognition Cognition Arousal/Alertness: Lethargic Overall Cognitive Status: Impaired Attention: Impaired Current Attention Level: Focused Orientation Level: Oriented to person;Disoriented to place;Disoriented to time;Disoriented to situation Following Commands: Follows one step commands inconsistently (~15% of time) Extremity Assessment RUE Assessment RUE Assessment: Not tested (Pt. reached to ~60 degrees shoulder elevation.) LUE Assessment LUE Assessment: Within Functional Limits (Grossly assesed, due to decreased command following) Mobility  Bed Mobility Bed Mobility: Yes Rolling Right: 2: Max assist Rolling Left: 2: Max assist Left Sidelying to Sit: 2: Max assist (max verbal cues to initiate)  Supine to Sit Details (indicate cue type and reason): Verbal/tactile cues for sequencing. Assist secondary to weakness Sitting - Scoot to Edge of Bed: 1: +1 Total assist Sitting - Scoot to Edge of Bed Details (indicate cue type and reason): Assist with pad, verbal cues for contribution Sit to Supine - Right: 3: Mod assist (Pt. initiated lifting legs) Sit to Supine - Right Details (indicate cue type and reason): Required step by step instructions Transfers Transfers: No (Pt. resisted all attempts to move sit to stand)  Sit to Stand Details (indicate cue type and reason): Verbal cues for sequence, bodymechanics, use of RW in standing.  Performed 3x.   d of Session OT - End of Session Activity Tolerance: Other (comment) (confusion) Patient left: in bed;with call bell in reach General Behavior During Session: Other (comment)  Cognition:  (confusion)   [name], Ursula Alert M 06/17/2011, 2:26 PM

## 2011-06-17 NOTE — Progress Notes (Addendum)
Patient ID: [name], female   DOB: [birth year], 77 y.o.   MRN: [id number] Subjective: No events overnight, c/o mild abdominal pain, drank some juice this am  Objective:  Vital signs in last 24 hours:  Filed Vitals:   06/16/11 1400 06/16/11 2051 06/17/11 0530 06/17/11 1042  BP: 128/70 126/70 136/70 149/82  Pulse: 86 100 86 92  Temp: 98.1 F (36.7 C) 99.2 F (37.3 C) 98.4 F (36.9 C)   TempSrc: Oral Oral Oral   Resp: 19 20 18    Height:      Weight:   72.7 kg (160 lb 4.4 oz)   SpO2: 94% 97% 95%     Intake/Output from previous day:   Intake/Output Summary (Last 24 hours) at 06/17/11 1114 Last data filed at 06/17/11 0900  Gross per 24 hour  Intake    253 ml  Output   2125 ml  Net  -1872 ml    Physical Exam: General: Alert, awake, in no acute distress. HEENT: No bruits, no goiter. Moist mucous membranes, no scleral icterus, no conjunctival pallor. Heart: Regular rate and rhythm, S1/S2 +, no murmurs, rubs, gallops. Lungs: Clear to auscultation bilaterally. No wheezing, no rhonchi, no rales.  Abdomen: Soft, tender over mid and upper abdomen, nondistended, positive bowel sounds. Extremities: No clubbing or cyanosis, no pitting edema,  positive pedal pulses. Neuro: No focal neurologic deficits  Lab Results:  Basic Metabolic Panel:    Component Value Date/Time   NA 131* 06/17/2011 0540   K 3.4* 06/17/2011 0540   CL 98 06/17/2011 0540   CO2 25 06/17/2011 0540   BUN 13 06/17/2011 0540   CREATININE 0.47* 06/17/2011 0540   GLUCOSE 127* 06/17/2011 0540   CALCIUM 8.7 06/17/2011 0540   CBC:    Component Value Date/Time   WBC 5.4 06/17/2011 0540   HGB 9.8* 06/17/2011 0540   HCT 30.4* 06/17/2011 0540   PLT 365 06/17/2011 0540   MCV 85.2 06/17/2011 0540   NEUTROABS 3.7 06/15/2011 0800   LYMPHSABS 1.9 06/15/2011 0800   MONOABS 0.6 06/15/2011 0800   EOSABS 0.2 06/15/2011 0800   BASOSABS 0.0 06/15/2011 0800      Lab 06/17/11 0540 06/15/11 0800 06/12/11 0519  WBC  5.4 6.3 7.8  HGB 9.8* 9.4* 9.8*  HCT 30.4* 29.1* 30.1*  PLT 365 369 380  MCV 85.2 85.3 85.3  MCH 27.5 27.6 27.8  MCHC 32.2 32.3 32.6  RDW 17.3* 17.7* 18.2*  LYMPHSABS -- 1.9 --  MONOABS -- 0.6 --  EOSABS -- 0.2 --  BASOSABS -- 0.0 --  BANDABS -- -- --    Lab 06/17/11 0540 06/16/11 0644 06/15/11 0800 06/14/11 0500 06/13/11 0914 06/11/11 0420  NA 131* 133* 131* 130* 132* --  K 3.4* 3.7 3.8 3.4* 3.4* --  CL 98 100 99 98 100 --  CO2 25 24 25 24 24  --  GLUCOSE 127* 135* 121* 136* 126* --  BUN 13 14 14 13 14  --  CREATININE 0.47* 0.51 0.47* 0.44* 0.45* --  CALCIUM 8.7 8.7 8.5 8.5 8.5 --  MG -- -- 1.4* -- -- 1.6   No results found for this basename: INR:5,PROTIME:5 in the last 168 hours Cardiac markers:   Lab 06/11/11 1250 06/11/11 0400  CKMB 1.7 2.1  TROPONINI <0.30 <0.30  MYOGLOBIN -- --   No components found with this basename: POCBNP:3 No results found for this or any previous visit (from the past 240 hour(s)).  Studies/Results: Ct Abdomen Pelvis W Contrast  06/15/2011  *  RADIOLOGY REPORT*  Clinical Data:  Post Billroth II, intra abdominal abscess, hypertension, dementia, anemia, acute abdominal pain  CT ABDOMEN AND PELVIS WITH CONTRAST  Technique:  Multidetector CT imaging of the abdomen and pelvis was performed following the standard protocol during bolus administration of intravenous contrast. Sagittal and coronal MPR images reconstructed from axial data set.  Contrast:  95 ml Omnipaque-300 IV, dilute oral contrast  Comparison: 06/08/2011  Findings: Fall Bibasilar atelectasis and small effusions. Gastrostomy tube in stomach. Percutaneous drainage catheter within the abscess collection in the anterior upper abdomen, with little change in size of of surrounding fluid collection, previously 3.8 x 1.5 cm, currently 5.0 x 1.0 cm. Additional small collection adjacent to the medial margin of the left lobe of the liver, 2.6 x 3.2 cm, not significantly changed.  Diffuse stranding of  upper abdominal tissue planes little changed. Tiny low attenuation focus within the pancreas 7 mm diameter, unchanged since earlier CT 05/29/2011. Liver, spleen, remainder of pancreas, kidneys, and adrenal glands normal appearance. Decreased gastric wall thickening versus previous study. Peritoneal dialysis catheter. Beam hardening artifacts from right hip prosthesis. Sigmoid diverticulosis.  Atrophic uterus and ovaries. Bowel loops otherwise unremarkable. Foley catheter decompresses urinary bladder. No mass, adenopathy, or new focal abscess collection. Scattered atherosclerotic calcifications. Bones diffusely demineralized. Poorly defined areas of infiltration within subcutaneous fat of the anterior abdominal wall likely reflect sites of medication injection.  IMPRESSION: The two fluid collections seen previously in the upper abdomen are little changed in overall size as impaired the previous study. Diffuse stranding of tissue planes in the upper abdomen without new/additional discrete abscess collection. Decreased wall thickening of the gastric remnant. 7 mm low attenuation nodule within the pancreas question cystic pancreatic tumor. Bibasilar effusions and atelectasis.  Sigmoid diverticulosis without evidence of diverticulitis.  Original Report Authenticated By: Lollie Marrow, M.D.    Medications: Scheduled Meds:    . alteplase  2 mg Intracatheter Once  . cloNIDine  0.1 mg Transdermal Weekly  . feeding supplement  1 Container Oral TID WC  . imipenem-cilastatin  500 mg Intravenous Q8H  . lip balm  1 application Topical BID  . metoprolol  5 mg Intravenous Q12H  . pantoprazole (PROTONIX) IV  40 mg Intravenous Q12H  . potassium chloride  10 mEq Intravenous Q1 Hr x 4  . DISCONTD: heparin subcutaneous  5,000 Units Subcutaneous Q8H   Continuous Infusions:    . sodium chloride 1,000 mL (06/16/11 1612)  . fat emulsion 240 mL (06/15/11 1734)  . fat emulsion    . TPN (CLINIMIX) +/- additives 80 mL/hr  at 06/16/11 1742  . TPN (CLINIMIX) +/- additives 80 mL/hr at 06/15/11 1734  . TPN (CLINIMIX) +/- additives     PRN Meds:.acetaminophen, hydrALAZINE, LORazepam, morphine injection, phenol, sodium chloride  Assessment/Plan:  Principal Problem:   *Intra-abdominal abscess - status post drain 05/30/2011 - continue imipenem Last Ct with no significant change Per CCS/IR  Active Problems:   Esophagitis, erosive - continue protonix   Hyperglycemia: stable   HYpokalemia: expect TNA to correct this   Deconditioning/ Disposition She needs SNF  Advance directives - Full code    LOS: 42 days   Arayna Illescas 06/17/2011, 11:14 AM  [facility] Pager: [phone]

## 2011-06-18 ENCOUNTER — Inpatient Hospital Stay (HOSPITAL_COMMUNITY): Payer: Medicare HMO

## 2011-06-18 LAB — GLUCOSE, CAPILLARY
Glucose-Capillary: 143 mg/dL — ABNORMAL HIGH (ref 70–99)
Glucose-Capillary: 132 mg/dL — ABNORMAL HIGH (ref 70–99)
Glucose-Capillary: 126 mg/dL — ABNORMAL HIGH (ref 70–99)

## 2011-06-18 LAB — BASIC METABOLIC PANEL
BUN: 12 mg/dL (ref 6–23)
Calcium: 8.6 mg/dL (ref 8.4–10.5)
GFR calc Af Amer: >90 mL/min (ref >90)
GFR calc non Af Amer: >90 mL/min (ref >90)
Glucose, Bld: 117 mg/dL — ABNORMAL HIGH (ref 70–99)
Potassium: 3.6 mEq/L (ref 3.5–5.1)
Sodium: 130 mEq/L — ABNORMAL LOW (ref 135–145)

## 2011-06-18 LAB — CBC
MCH: 27.6 pg (ref 26.0–34.0)
MCHC: 32.2 g/dL (ref 30.0–36.0)
RDW: 17.4 % — ABNORMAL HIGH (ref 11.5–15.5)

## 2011-06-18 MED ORDER — ONDANSETRON HCL 4 MG/2ML IJ SOLN
4.0000 mg | Freq: Four times a day (QID) | INTRAMUSCULAR | Status: DC | PRN
  Administered 2011-06-18 – 2011-07-05 (×2): 4 mg via INTRAVENOUS
  Filled 2011-06-18 (×2): qty 2

## 2011-06-18 MED ORDER — ALTEPLASE 100 MG IV SOLR
2.0000 mg | Freq: Once | INTRAVENOUS | Status: AC
  Administered 2011-06-18: 2 mg
  Filled 2011-06-18: qty 2

## 2011-06-18 MED ORDER — INSULIN REGULAR HUMAN 100 UNIT/ML IJ SOLN
INTRAVENOUS | Status: AC
  Administered 2011-06-18: 18:00:00 via INTRAVENOUS
  Filled 2011-06-18: qty 2000

## 2011-06-18 NOTE — Progress Notes (Signed)
PARENTERAL NUTRITION CONSULT NOTE - FOLLOW UP  Pharmacy Consult for TPN Indication: Not tolerating TF  Allergies  Allergen Reactions  . Other Swelling    Pinto beans cause swelling and hives  . Penicillins Swelling    Patient Measurements: Height: 5\' 6"  (167.6 cm) Weight: 159 lb 2.8 oz (72.2 kg) IBW/kg (Calculated) : 59.3    Vital Signs: Temp: 99 F (37.2 C) (12/20 0620) Temp src: Oral (12/20 0620) BP: 138/71 mmHg (12/20 0620) Pulse Rate: 90  (12/20 0620) Intake/Output from previous day: 12/19 0701 - 12/20 0700 In: 433 [P.O.:33; I.V.:400] Out: 3192 [Urine:3175; Drains:17] Intake/Output from this shift:    Labs:  [facility] 06/18/11 0550 06/17/11 0540  WBC 5.4 5.4  HGB 9.7* 9.8*  HCT 30.1* 30.4*  PLT 348 365  APTT -- --  INR -- --     Basename 06/18/11 0550 06/17/11 0540 06/16/11 0644  NA 130* 131* 133*  K 3.6 3.4* 3.7  CL 98 98 100  CO2 24 25 24   GLUCOSE 117* 127* 135*  BUN 12 13 14   CREATININE 0.44* 0.47* 0.51  LABCREA -- -- --  CREAT24HRUR -- -- --  CALCIUM 8.6 8.7 8.7  MG -- -- --  PHOS -- -- --  PROT -- -- 6.2  ALBUMIN -- -- 1.8*  AST -- -- 16  ALT -- -- 9  ALKPHOS -- -- 79  BILITOT -- -- 0.2*  BILIDIR -- -- --  IBILI -- -- --  PREALBUMIN -- -- --  TRIG -- -- --  CHOLHDL -- -- --  CHOL -- -- --   Estimated Creatinine Clearance: 60 ml/min (by C-G formula based on Cr of 0.44).    Basename 06/18/11 0811 06/18/11 0623 06/18/11 0055  GLUCAP 132* 143* 128*    Medications:  Scheduled:     . alteplase  2 mg Intracatheter Once  . alteplase  2 mg Intracatheter Once  . cloNIDine  0.1 mg Transdermal Weekly  . feeding supplement  1 Container Oral TID WC  . imipenem-cilastatin  500 mg Intravenous Q8H  . lip balm  1 application Topical BID  . metoprolol  5 mg Intravenous Q12H  . pantoprazole (PROTONIX) IV  40 mg Intravenous Q12H  . potassium chloride  10 mEq Intravenous Q1 Hr x 4    Insulin Requirements in the past 24 hours:  50 units  regular insulin in TPN SSI d/c d/t good CBG control  Current Nutrition:  Clear liquid diet TPN with Clinimix E 5/15 @ 80cc/hr, Lipids MWF- ~1600Kcal and 96 g protein daily  Assessment: 77 y/o previously on TPN (for erosive esophagitis with perforated pyloric ulcer s/p lap). S/p gastrostomy, gastrectomy and cholecystectomy 11/7.  TPN initially started 05/12/11 - 05/14/2011. 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 TPN ongoing d/t poor PO intake, changed to continuous 24h infusion d/t low Na. Diet is clear liquids currently.   GI Ongoing abd pain, small PO intake of clears. Noted re-CT abd without new abscesses, old fluid collections unchanged in size- plans are to watch output as PO intake incr. Noted possible plans to start TF via GT.  Endo CBG controlled 128-143, on insulin in TPN. No SSI coverage, would continue CBG checks d/t insulin in TPN (50 units/bag)  Lytes/Renal  Scr, UOP WNL. Na low, likely d/t overall fluid overload. Other lytes ok. Acid-based status ok. NS at 50cc/hr. Cannot correct individual lytes in TPN d/t premade bags.  Hepatobil Trig and LFTs WNL. Prealbumin  low for goal 18-40g/dl. Slow progress expected as patient continues to have small PO intake. TPN provides goal protein and ~90% Kcals. Would await PO progress before increasing TPN further.  Pulm RA  Cardiology VSS  ID Afeb, WBC WNL. Primaxin#21 cont- all cx neg except Ecoli and PSA UTI 12/1- ? D/c Primaxin or chg to Cipro (PSA was sens), continued therapy does not seem warranted- WBC WNL, pt afebrile. Abscesses unchanged.  Px SQ hep, IV PPI    Nutritional Goals:  1785-2000 kCal, 95-110 grams of protein per day   Plan: 1. Continue Clinimix E 5/15 at 80 cc/hr *MVI/Trace/IV fats MWF d/t national shortage* 2. BMET, Mg and Phos in AM. Will reinstate TPN monitoring labs that were discontinued. 3. MD please address abx duration.  Shaneil Yazdi K. Allena Katz, PharmD,  BCPS.  Clinical Pharmacist Pager [phone]. 06/18/2011 9:59 AM

## 2011-06-18 NOTE — Progress Notes (Signed)
42 Days Post-Op  Subjective: Rt abd drain placed 12/2 Post op abscess Better today  Objective: Vital signs in last 24 hours: Temp:  [98.2 F (36.8 C)-100.6 F (38.1 C)] 99 F (37.2 C) (12/20 0620) Pulse Rate:  [79-103] 90  (12/20 0620) Resp:  [18-22] 22  (12/20 0620) BP: (136-149)/(71-82) 138/71 mmHg (12/20 0620) SpO2:  [94 %-96 %] 95 % (12/20 0620) Weight:  [159 lb 2.8 oz (72.2 kg)] 159 lb 2.8 oz (72.2 kg) (12/20 0620) Last BM Date: 06/14/11  Intake/Output from previous day: 12/19 0701 - 12/20 0700 In: 433 [P.O.:33; I.V.:400] Out: 3192 [Urine:3175; Drains:17] Intake/Output this shift:    PE:  Site of JP drain NT; clean and dry Output minimal 7 cc 12/19; scant in JP now Output is milky yellow still Wbc nl Tmax: 100.6  Lab Results:   Basename 06/18/11 0550 06/17/11 0540  WBC 5.4 5.4  HGB 9.7* 9.8*  HCT 30.1* 30.4*  PLT 348 365   BMET  Basename 06/18/11 0550 06/17/11 0540  NA 130* 131*  K 3.6 3.4*  CL 98 98  CO2 24 25  GLUCOSE 117* 127*  BUN 12 13  CREATININE 0.44* 0.47*  CALCIUM 8.6 8.7   PT/INR No results found for this basename: LABPROT:2,INR:2 in the last 72 hours ABG No results found for this basename: PHART:2,PCO2:2,PO2:2,HCO3:2 in the last 72 hours  Studies/Results: No results found.  Anti-infectives: Anti-infectives     Start     Dose/Rate Route Frequency Ordered Stop   06/03/11 2115   ciprofloxacin (CIPRO) IVPB 400 mg  Status:  Discontinued     Comments: Please mix with NS.      400 mg 200 mL/hr over 60 Minutes Intravenous Every 12 hours 06/03/11 2114 06/03/11 2121   05/30/11 1130   ciprofloxacin (CIPRO) IVPB 400 mg  Status:  Discontinued        400 mg 200 mL/hr over 60 Minutes Intravenous 2 times daily 05/30/11 1038 06/02/11 1618   05/29/11 2100   imipenem-cilastatin (PRIMAXIN) 500 mg in sodium chloride 0.9 % 100 mL IVPB        500 mg 200 mL/hr over 30 Minutes Intravenous Every 8 hours 05/29/11 2007     05/29/11 2100   vancomycin  (VANCOCIN) 1,250 mg in sodium chloride 0.9 % 250 mL IVPB  Status:  Discontinued        1,250 mg 166.7 mL/hr over 90 Minutes Intravenous Every 24 hours 05/29/11 2007 06/03/11 1006   05/07/11 1400   imipenem-cilastatin (PRIMAXIN) 500 mg in sodium chloride 0.9 % 100 mL IVPB        500 mg 200 mL/hr over 30 Minutes Intravenous 3 times per day 05/07/11 1106 05/21/11 1359   05/07/11 1200   micafungin (MYCAMINE) 100 mg in sodium chloride 0.9 % 100 mL IVPB  Status:  Discontinued     Comments: PER PHARMACY      100 mg 100 mL/hr over 1 Hours Intravenous Every 24 hours 05/07/11 1032 05/19/11 1103   05/07/11 1200   vancomycin (VANCOCIN) 1,250 mg in sodium chloride 0.9 % 250 mL IVPB  Status:  Discontinued        1,250 mg 166.7 mL/hr over 90 Minutes Intravenous Every 24 hours 05/07/11 1106 05/19/11 1103   05/07/11 1000   micafungin (MYCAMINE) 150 mg in sodium chloride 0.9 % 100 mL IVPB  Status:  Discontinued     Comments: PER PHARMACY      150 mg 100 mL/hr over 1 Hours Intravenous Daily  05/07/11 0951 05/07/11 1029   05/07/11 0300   ertapenem (INVANZ) 1 g in sodium chloride 0.9 % 50 mL IVPB        1 g 100 mL/hr over 30 Minutes Intravenous  Once 05/07/11 0300 05/07/11 0348   05/07/11 0130   cefTRIAXone (ROCEPHIN) 1 g in dextrose 5 % 50 mL IVPB        1 g 100 mL/hr over 30 Minutes Intravenous  Once 05/07/11 0122 05/07/11 0200          Assessment/Plan: s/p Procedure(s): EXPLORATORY LAPAROTOMY GASTROSTOMY GASTRECTOMY CHOLECYSTECTOMY  Rt abd drain placed 12/2 Only small amt output now in last few days CT 12/17: still with collection evident and not much change Re Ct?; plan per surgery   [name] 06/18/2011

## 2011-06-18 NOTE — Progress Notes (Signed)
42 Days Post-Op  Subjective: No new c/o. Seems to have tolerated some clears, though she take much in per flowsheet.  Objective: Vital signs in last 24 hours: Temp:  [98.2 F (36.8 C)-100.6 F (38.1 C)] 99 F (37.2 C) (12/20 0620) Pulse Rate:  [79-103] 90  (12/20 0620) Resp:  [18-22] 22  (12/20 0620) BP: (136-149)/(71-82) 138/71 mmHg (12/20 0620) SpO2:  [94 %-96 %] 95 % (12/20 0620) Weight:  [72.2 kg (159 lb 2.8 oz)] 159 lb 2.8 oz (72.2 kg) (12/20 0620) Last BM Date: 06/14/11  Intake/Output this shift:    Physical Exam: BP 138/71  Pulse 90  Temp(Src) 99 F (37.2 C) (Oral)  Resp 22  Ht 5\' 6"  (1.676 m)  Wt 72.2 kg (159 lb 2.8 oz)  BMI 25.69 kg/m2  SpO2 95% Abdomen: wound clean, pink Drains intact, perc drain still purulent, JP still serous and scant. GF-tube intact, clamped.   Labs: CBC  Basename 06/18/11 0550 06/17/11 0540  WBC 5.4 5.4  HGB 9.7* 9.8*  HCT 30.1* 30.4*  PLT 348 365   BMET  Basename 06/18/11 0550 06/17/11 0540  NA 130* 131*  K 3.6 3.4*  CL 98 98  CO2 24 25  GLUCOSE 117* 127*  BUN 12 13  CREATININE 0.44* 0.47*  CALCIUM 8.6 8.7   LFT  Basename 06/16/11 0644  PROT 6.2  ALBUMIN 1.8*  AST 16  ALT 9  ALKPHOS 79  BILITOT 0.2*  BILIDIR --  IBILI --  LIPASE --   PT/INR No results found for this basename: LABPROT:2,INR:2 in the last 72 hours ABG No results found for this basename: PHART:2,PCO2:2,PO2:2,HCO3:2 in the last 72 hours  Studies/Results: No results found.  Assessment: Principal Problem:  *Intra-abdominal abscess Active Problems:  DIABETES MELLITUS, TYPE II  HYPERTENSION  Anemia  Hyponatremia  Abdominal pain, acute  Metabolic acidosis  Esophagitis, erosive  Acute respiratory failure   Procedure(s): EXPLORATORY LAPAROTOMY GASTROSTOMY GASTRECTOMY CHOLECYSTECTOMY  Plan: WBC stable. Continue to encourage clears, would like to see her tolerate a little more before advancing. Or if she does not do much more on her  own, we could start TF via G-tube.  LOS: 43 days    Marianna Fuss 06/18/2011

## 2011-06-18 NOTE — Progress Notes (Signed)
Chaplain's Note:  I introduced chaplain services to pt.  Pt did not appear to comprehend.  She appeared anxious and complained of being in pain. I alerted her nurse that the pt was complaining of pain. Please call me if I am needed or requested. Marium Ragan  [phone]/  oncall [phone]

## 2011-06-18 NOTE — Progress Notes (Signed)
Patient ID: [name], female   DOB: 14-Apr-1932, 77 y.o.   MRN: [id number] Subjective:  c/o worsening abdominal pain, nausea and vomiting,  drank some juice this am  Objective:  Vital signs in last 24 hours:  Filed Vitals:   06/18/11 0329 06/18/11 0620 06/18/11 1026 06/18/11 1349  BP:  138/71 128/80 134/76  Pulse:  90 89 86  Temp: 98.2 F (36.8 C) 99 F (37.2 C)  99.1 F (37.3 C)  TempSrc: Axillary Oral  Oral  Resp:  22  20  Height:      Weight:  72.2 kg (159 lb 2.8 oz)    SpO2:  95%  96%    Intake/Output from previous day:   Intake/Output Summary (Last 24 hours) at 06/18/11 1454 Last data filed at 06/18/11 1234  Gross per 24 hour  Intake    430 ml  Output   2642 ml  Net  -2212 ml    Physical Exam: General: Alert, awake, in no acute distress. HEENT: No bruits, no goiter. Moist mucous membranes, no scleral icterus, no conjunctival pallor. Heart: Regular rate and rhythm, S1/S2 +, no murmurs, rubs, gallops. Lungs: Clear to auscultation bilaterally. No wheezing, no rhonchi, no rales.  Abdomen: Soft, tender diffusely especially in lower quadrants, no rebound, positive bowel sounds. Extremities: No clubbing or cyanosis, no pitting edema,  positive pedal pulses. Neuro: No focal neurologic deficits  Lab Results:  Basic Metabolic Panel:    Component Value Date/Time   NA 130* 06/18/2011 0550   K 3.6 06/18/2011 0550   CL 98 06/18/2011 0550   CO2 24 06/18/2011 0550   BUN 12 06/18/2011 0550   CREATININE 0.44* 06/18/2011 0550   GLUCOSE 117* 06/18/2011 0550   CALCIUM 8.6 06/18/2011 0550   CBC:    Component Value Date/Time   WBC 5.4 06/18/2011 0550   HGB 9.7* 06/18/2011 0550   HCT 30.1* 06/18/2011 0550   PLT 348 06/18/2011 0550   MCV 85.5 06/18/2011 0550   NEUTROABS 3.7 06/15/2011 0800   LYMPHSABS 1.9 06/15/2011 0800   MONOABS 0.6 06/15/2011 0800   EOSABS 0.2 06/15/2011 0800   BASOSABS 0.0 06/15/2011 0800      Lab 06/18/11 0550 06/17/11 0540 06/15/11 0800  06/12/11 0519  WBC 5.4 5.4 6.3 7.8  HGB 9.7* 9.8* 9.4* 9.8*  HCT 30.1* 30.4* 29.1* 30.1*  PLT 348 365 369 380  MCV 85.5 85.2 85.3 85.3  MCH 27.6 27.5 27.6 27.8  MCHC 32.2 32.2 32.3 32.6  RDW 17.4* 17.3* 17.7* 18.2*  LYMPHSABS -- -- 1.9 --  MONOABS -- -- 0.6 --  EOSABS -- -- 0.2 --  BASOSABS -- -- 0.0 --  BANDABS -- -- -- --    Lab 06/18/11 0550 06/17/11 0540 06/16/11 0644 06/15/11 0800 06/14/11 0500  NA 130* 131* 133* 131* 130*  K 3.6 3.4* 3.7 3.8 3.4*  CL 98 98 100 99 98  CO2 24 25 24 25 24   GLUCOSE 117* 127* 135* 121* 136*  BUN 12 13 14 14 13   CREATININE 0.44* 0.47* 0.51 0.47* 0.44*  CALCIUM 8.6 8.7 8.7 8.5 8.5  MG -- -- -- 1.4* --   No results found for this basename: INR:5,PROTIME:5 in the last 168 hours Cardiac markers:  No results found for this basename: CK:3,CKMB:3,TROPONINI:3,MYOGLOBIN:3 in the last 168 hours No components found with this basename: POCBNP:3 No results found for this or any previous visit (from the past 240 hour(s)).  Studies/Results: No results found.  Medications: Scheduled Meds:    .  alteplase  2 mg Intracatheter Once  . alteplase  2 mg Intracatheter Once  . cloNIDine  0.1 mg Transdermal Weekly  . feeding supplement  1 Container Oral TID WC  . imipenem-cilastatin  500 mg Intravenous Q8H  . lip balm  1 application Topical BID  . metoprolol  5 mg Intravenous Q12H  . pantoprazole (PROTONIX) IV  40 mg Intravenous Q12H   Continuous Infusions:    . sodium chloride 1,000 mL (06/18/11 1020)  . fat emulsion 240 mL (06/17/11 1805)  . TPN (CLINIMIX) +/- additives 80 mL/hr at 06/16/11 1742  . TPN (CLINIMIX) +/- additives    . TPN (CLINIMIX) +/- additives 80 mL/hr at 06/17/11 1804   PRN Meds:.acetaminophen, hydrALAZINE, LORazepam, morphine injection, ondansetron (ZOFRAN) IV, phenol, sodium chloride  Assessment/Plan:  Principal Problem:   *Intra-abdominal abscess - status post drain 05/30/2011 On day #20 of imipenem Last Ct with no  significant change in abscess, she has not improved with drain per IR, await definitive plan per surgery Per CCS/IR Today with worsening pain, will check stat KUB  Active Problems:   Esophagitis, erosive - continue protonix   Hyperglycemia: stable   HYpokalemia: expect TNA to correct this   Deconditioning/ Disposition She needs SNF  Advance directives - Full code    LOS: 43 days   [name] 06/18/2011, 2:54 PM  [facility] Pager: [phone]

## 2011-06-19 LAB — BASIC METABOLIC PANEL
Chloride: 98 mEq/L (ref 96–112)
GFR calc Af Amer: >90 mL/min (ref >90)
GFR calc non Af Amer: >90 mL/min (ref >90)
Glucose, Bld: 135 mg/dL — ABNORMAL HIGH (ref 70–99)
Potassium: 3.7 mEq/L (ref 3.5–5.1)
Sodium: 131 mEq/L — ABNORMAL LOW (ref 135–145)

## 2011-06-19 LAB — GLUCOSE, CAPILLARY
Glucose-Capillary: 143 mg/dL — ABNORMAL HIGH (ref 70–99)
Glucose-Capillary: 146 mg/dL — ABNORMAL HIGH (ref 70–99)
Glucose-Capillary: 136 mg/dL — ABNORMAL HIGH (ref 70–99)

## 2011-06-19 LAB — CBC
Hemoglobin: 9.7 g/dL — ABNORMAL LOW (ref 12.0–15.0)
MCV: 86.1 fL (ref 78.0–100.0)
Platelets: 341 10*3/uL (ref 150–400)
RBC: 3.46 MIL/uL — ABNORMAL LOW (ref 3.87–5.11)
WBC: 5.1 10*3/uL (ref 4.0–10.5)

## 2011-06-19 MED ORDER — FAT EMULSION 20 % IV EMUL
240.0000 mL | INTRAVENOUS | Status: AC
  Administered 2011-06-19: 240 mL via INTRAVENOUS
  Filled 2011-06-19: qty 250

## 2011-06-19 MED ORDER — TRACE MINERALS CR-CU-MN-SE-ZN 10-1000-500-60 MCG/ML IV SOLN
INTRAVENOUS | Status: AC
  Administered 2011-06-19: 17:00:00 via INTRAVENOUS
  Filled 2011-06-19: qty 2000

## 2011-06-19 NOTE — Progress Notes (Signed)
PARENTERAL NUTRITION CONSULT NOTE - FOLLOW UP  Pharmacy Consult for TPN Indication: Not tolerating TF  Allergies  Allergen Reactions  . Other Swelling    Pinto beans cause swelling and hives  . Penicillins Swelling    Patient Measurements: Height: 5\' 6"  (167.6 cm) Weight: 161 lb 6 oz (73.2 kg) IBW/kg (Calculated) : 59.3    Vital Signs: Temp: 99.7 F (37.6 C) (12/21 0500) Temp src: Oral (12/21 0500) BP: 148/79 mmHg (12/21 0500) Pulse Rate: 90  (12/21 0500) Intake/Output from previous day: 12/20 0701 - 12/21 0700 In: 0  Out: 2120.5 [Urine:2100; Drains:19.5; Stool:1] Intake/Output from this shift:    Labs:  [facility] 06/19/11 0600 06/18/11 0550 06/17/11 0540  WBC 5.1 5.4 5.4  HGB 9.7* 9.7* 9.8*  HCT 29.8* 30.1* 30.4*  PLT 341 348 365  APTT -- -- --  INR -- -- --     Basename 06/19/11 0600 06/18/11 0550 06/17/11 0540  NA 131* 130* 131*  K 3.7 3.6 3.4*  CL 98 98 98  CO2 25 24 25   GLUCOSE 135* 117* 127*  BUN 13 12 13   CREATININE 0.45* 0.44* 0.47*  LABCREA -- -- --  CREAT24HRUR -- -- --  CALCIUM 8.9 8.6 8.7  MG 1.5 -- --  PHOS 4.8* -- --  PROT -- -- --  ALBUMIN -- -- --  AST -- -- --  ALT -- -- --  ALKPHOS -- -- --  BILITOT -- -- --  BILIDIR -- -- --  IBILI -- -- --  PREALBUMIN -- -- --  TRIG -- -- --  CHOLHDL -- -- --  CHOL -- -- --   Estimated Creatinine Clearance: 60.3 ml/min (by C-G formula based on Cr of 0.45).    Basename 06/19/11 0841 06/19/11 0401 06/19/11 0047  GLUCAP 164* 143* 119*    Medications:  Scheduled:    . alteplase  2 mg Intracatheter Once  . alteplase  2 mg Intracatheter Once  . cloNIDine  0.1 mg Transdermal Weekly  . feeding supplement  1 Container Oral TID WC  . imipenem-cilastatin  500 mg Intravenous Q8H  . lip balm  1 application Topical BID  . metoprolol  5 mg Intravenous Q12H  . pantoprazole (PROTONIX) IV  40 mg Intravenous Q12H    Insulin Requirements in the past 24 hours:  50 units Regular Insulin per bag,  no SSI with continued CBG checks due to insulin in TPN  Assessment/Plan:  TPN continues due to ongoing poor po intake, now considering TF if po doesn't improve. 1.  Lytes- Na remains low at 131, but stable.  All other lytes wnl  2.  Endocrine- cbg's controlled on current insulin, ranging 109-143 on current bag.    3.  BMet in AM   Phill Steck P 06/19/2011,9:52 AM

## 2011-06-19 NOTE — Progress Notes (Signed)
Utilization Review Completed.  Vegas Coffin T  06/19/2011 

## 2011-06-19 NOTE — Progress Notes (Signed)
Physical Therapy Treatment [name] MRN: [id number] DOB: April 17, 1932 Today's Date: 06/19/2011  PT Assessment/Plan  PT - Assessment/Plan Comments on Treatment Session: Pt continues to limit self and continually requests to remain in bed however was able to contribute slightly to OOB transfer today. Pt currently requires +2 Total assist to transfer.  PT Plan: Discharge plan remains appropriate PT Frequency: Min 3X/week Follow Up Recommendations: Skilled nursing facility Equipment Recommended: Defer to next venue PT Goals  Acute Rehab PT Goals PT Goal Formulation: With patient Time For Goal Achievement: 2 weeks Pt will go Supine/Side to Sit: with min assist PT Goal: Supine/Side to Sit - Progress: Progressing toward goal Pt will go Sit to Supine/Side: with min assist PT Goal: Sit to Supine/Side - Progress: Progressing toward goal Pt will Transfer Bed to Chair/Chair to Bed: with mod assist PT Transfer Goal: Bed to Chair/Chair to Bed - Progress: Progressing toward goal Pt will Ambulate: 1 - 15 feet;with mod assist;with rolling walker PT Goal: Ambulate - Progress: Progressing toward goal  PT Treatment Precautions/Restrictions  Precautions Precautions: Fall Precaution Comments: Significant weakness Restrictions Weight Bearing Restrictions: No Mobility (including Balance) Bed Mobility Bed Mobility: Yes Rolling Left: 3: Mod assist Rolling Left Details (indicate cue type and reason): Verbal/tactile cues for use of rail, sequence Left Sidelying to Sit: 3: Mod assist (max verbal cues to initiate) Left Sidelying to Sit Details (indicate cue type and reason): Verbal cues for sequence, assistance for trunk, rotation of pelvis into sitting position with pad.  Sit to Supine - Right:  (Pt. initiated lifting legs) Transfers Transfers: Yes Sit to Stand: 1: +2 Total assist;Patient percentage (comment);From bed (pt= 30%) Sit to Stand Details (indicate cue type and  reason): Verbal cues for hand placement, contribution to task Stand to Sit: 1: +2 Total assist;Patient percentage (comment);To chair/3-in-1 (pt= 30%) Squat Pivot Transfers: 1: +2 Total assist;Patient percentage (comment) (pt = 30%) Squat Pivot Transfer Details (indicate cue type and reason): Verbal cues for sequence and contribution. Pt unable to perform weight shifting onto Bil. LEs and performed transfer via pivoting feet.  Ambulation/Gait Ambulation/Gait: No    Exercise  General Exercises - Lower Extremity Long Arc Quad: Seated;Both;AROM;10 reps Hip Flexion/Marching: AROM;Both;5 reps;Seated End of Session PT - End of Session Equipment Utilized During Treatment: Gait belt Activity Tolerance: Patient limited by pain;Patient limited by fatigue Patient left: in chair;with call bell in reach Nurse Communication: Mobility status for transfers General Behavior During Session: [facility] for tasks performed Cognition: Impaired (Pt having difficulty focusing on task. Easily distracted) Cognitive Impairment: Decreased attention, decreased ability to understand task at hand, slow to process.   [name] 06/19/2011, 10:19 AM  [name]) [name] PT, DPT Acute Rehabilitation [phone]

## 2011-06-19 NOTE — Progress Notes (Signed)
43 Days Post-Op  Subjective: Pt ok. Pain better today 'Not too bad" Says she drank more liquids but none documented on flowsheet.  Objective: Vital signs in last 24 hours: Temp:  [98.8 F (37.1 C)-99.7 F (37.6 C)] 99.7 F (37.6 C) (12/21 0500) Pulse Rate:  [86-102] 90  (12/21 0500) Resp:  [20] 20  (12/21 0500) BP: (124-148)/(73-80) 148/79 mmHg (12/21 0500) SpO2:  [94 %-98 %] 94 % (12/21 0500) Weight:  [73.2 kg (161 lb 6 oz)] 161 lb 6 oz (73.2 kg) (12/21 0500) Last BM Date: 06/14/11  Intake/Output this shift:    Physical Exam: BP 148/79  Pulse 90  Temp(Src) 99.7 F (37.6 C) (Oral)  Resp 20  Ht 5\' 6"  (1.676 m)  Wt 73.2 kg (161 lb 6 oz)  BMI 26.05 kg/m2  SpO2 94% Abdomen: wound clean Drains intact, no issues.  Labs: CBC  Basename 06/19/11 0600 06/18/11 0550  WBC 5.1 5.4  HGB 9.7* 9.7*  HCT 29.8* 30.1*  PLT 341 348   BMET  Basename 06/19/11 0600 06/18/11 0550  NA 131* 130*  K 3.7 3.6  CL 98 98  CO2 25 24  GLUCOSE 135* 117*  BUN 13 12  CREATININE 0.45* 0.44*  CALCIUM 8.9 8.6   LFT No results found for this basename: PROT,ALBUMIN,AST,ALT,ALKPHOS,BILITOT,BILIDIR,IBILI,LIPASE in the last 72 hours PT/INR No results found for this basename: LABPROT:2,INR:2 in the last 72 hours ABG No results found for this basename: PHART:2,PCO2:2,PO2:2,HCO3:2 in the last 72 hours  Studies/Results: Dg Abd 1 View  06/18/2011  *RADIOLOGY REPORT*  Clinical Data: Abdominal pain.  ABDOMEN - 1 VIEW  Comparison: Multiple priors.  Findings: Pigtail drainage catheter left upper quadrant appears unchanged.  Barium is seen throughout the colon.  There is a right total hip replacement.  There is no visible obstruction or free air.  IMPRESSION: No acute findings.  Original Report Authenticated By: Elsie Stain, M.D.    Assessment: Principal Problem:  *Intra-abdominal abscess Active Problems:  DIABETES MELLITUS, TYPE II  HYPERTENSION  Anemia  Hyponatremia  Abdominal pain,  acute  Metabolic acidosis  Esophagitis, erosive  Acute respiratory failure   Procedure(s): EXPLORATORY LAPAROTOMY GASTROSTOMY GASTRECTOMY CHOLECYSTECTOMY  Plan: WBC normal, temp down, pain better today Continue present tx FOllow po intake but may need to start TF.  LOS: 44 days    Marianna Fuss 06/19/2011

## 2011-06-20 LAB — CBC
HCT: 29.4 % — ABNORMAL LOW (ref 36.0–46.0)
Hemoglobin: 9.4 g/dL — ABNORMAL LOW (ref 12.0–15.0)
MCV: 85.5 fL (ref 78.0–100.0)
WBC: 4.4 10*3/uL (ref 4.0–10.5)

## 2011-06-20 LAB — GLUCOSE, CAPILLARY
Glucose-Capillary: 118 mg/dL — ABNORMAL HIGH (ref 70–99)
Glucose-Capillary: 130 mg/dL — ABNORMAL HIGH (ref 70–99)
Glucose-Capillary: 132 mg/dL — ABNORMAL HIGH (ref 70–99)
Glucose-Capillary: 135 mg/dL — ABNORMAL HIGH (ref 70–99)
Glucose-Capillary: 141 mg/dL — ABNORMAL HIGH (ref 70–99)
Glucose-Capillary: 144 mg/dL — ABNORMAL HIGH (ref 70–99)

## 2011-06-20 LAB — BASIC METABOLIC PANEL
BUN: 11 mg/dL (ref 6–23)
Chloride: 99 mEq/L (ref 96–112)
Glucose, Bld: 132 mg/dL — ABNORMAL HIGH (ref 70–99)
Potassium: 3.4 mEq/L — ABNORMAL LOW (ref 3.5–5.1)

## 2011-06-20 MED ORDER — PANTOPRAZOLE SODIUM 40 MG PO TBEC
40.0000 mg | DELAYED_RELEASE_TABLET | Freq: Two times a day (BID) | ORAL | Status: DC
  Administered 2011-06-20 – 2011-07-06 (×32): 40 mg via ORAL
  Filled 2011-06-20 (×32): qty 1

## 2011-06-20 MED ORDER — POTASSIUM CHLORIDE 20 MEQ/15ML (10%) PO LIQD
40.0000 meq | Freq: Once | ORAL | Status: AC
  Administered 2011-06-20: 40 meq
  Filled 2011-06-20: qty 30

## 2011-06-20 MED ORDER — INSULIN REGULAR HUMAN 100 UNIT/ML IJ SOLN
INTRAVENOUS | Status: AC
  Administered 2011-06-20: 18:00:00 via INTRAVENOUS
  Filled 2011-06-20: qty 2000

## 2011-06-20 NOTE — Progress Notes (Signed)
44 Days Post-Op  Subjective: Pt ok, says pain not too bad. Drinking some clears, thinks she could try more.  Objective: Vital signs in last 24 hours: Temp:  [98.1 F (36.7 C)-99 F (37.2 C)] 98.1 F (36.7 C) (12/22 0428) Pulse Rate:  [89-92] 92  (12/22 0428) Resp:  [20] 20  (12/22 0428) BP: (114-148)/(73-84) 146/77 mmHg (12/22 0428) SpO2:  [94 %] 94 % (12/22 0428) Last BM Date: 06/19/11  Intake/Output this shift: Total I/O In: 120 [P.O.:120] Out: 625 [Urine:625]  Physical Exam: BP 146/77  Pulse 92  Temp(Src) 98.1 F (36.7 C) (Axillary)  Resp 20  Ht 5\' 6"  (1.676 m)  Wt 161 lb 6 oz (73.2 kg)  BMI 26.05 kg/m2  SpO2 94% ABdomen: drains intact. Wound healing. G-tube clamped.  Labs: CBC  Basename 06/20/11 0500 06/19/11 0600  WBC 4.4 5.1  HGB 9.4* 9.7*  HCT 29.4* 29.8*  PLT 351 341   BMET  Basename 06/20/11 0500 06/19/11 0600  NA 131* 131*  K 3.4* 3.7  CL 99 98  CO2 26 25  GLUCOSE 132* 135*  BUN 11 13  CREATININE 0.43* 0.45*  CALCIUM 8.6 8.9   LFT No results found for this basename: PROT,ALBUMIN,AST,ALT,ALKPHOS,BILITOT,BILIDIR,IBILI,LIPASE in the last 72 hours PT/INR No results found for this basename: LABPROT:2,INR:2 in the last 72 hours ABG No results found for this basename: PHART:2,PCO2:2,PO2:2,HCO3:2 in the last 72 hours  Studies/Results: Dg Abd 1 View  06/18/2011  *RADIOLOGY REPORT*  Clinical Data: Abdominal pain.  ABDOMEN - 1 VIEW  Comparison: Multiple priors.  Findings: Pigtail drainage catheter left upper quadrant appears unchanged.  Barium is seen throughout the colon.  There is a right total hip replacement.  There is no visible obstruction or free air.  IMPRESSION: No acute findings.  Original Report Authenticated By: Elsie Stain, M.D.    Assessment: Principal Problem:  *Intra-abdominal abscess Active Problems:  DIABETES MELLITUS, TYPE II  HYPERTENSION  Anemia  Hyponatremia  Abdominal pain, acute  Metabolic acidosis  Esophagitis,  erosive  Acute respiratory failure   Procedure(s): EXPLORATORY LAPAROTOMY GASTROSTOMY GASTRECTOMY CHOLECYSTECTOMY  Plan: Will add icees and ice cream to liquids diet, encouraged po intake.  LOS: 45 days    Ontario Pettengill J 06/20/2011

## 2011-06-20 NOTE — Progress Notes (Signed)
PARENTERAL NUTRITION CONSULT NOTE - FOLLOW UP  Pharmacy Consult for TPN Indication: Not tolerating TF  Allergies  Allergen Reactions  . Other Swelling    Pinto beans cause swelling and hives  . Penicillins Swelling    Patient Measurements: Height: 5\' 6"  (167.6 cm) Weight: 161 lb 6 oz (73.2 kg) IBW/kg (Calculated) : 59.3    Vital Signs: Temp: 98.1 F (36.7 C) (12/22 0428) Temp src: Axillary (12/22 0428) BP: 146/77 mmHg (12/22 0428) Pulse Rate: 92  (12/22 0428) Intake/Output from previous day: 12/21 0701 - 12/22 0700 In: 13208.8 [P.O.:30; IV Piggyback:1800; ZOX:09604.5] Out: 1076 [Urine:1075; Stool:1] Intake/Output from this shift:    Labs:  Basename 06/20/11 0500 06/19/11 0600 06/18/11 0550  WBC 4.4 5.1 5.4  HGB 9.4* 9.7* 9.7*  HCT 29.4* 29.8* 30.1*  PLT 351 341 348  APTT -- -- --  INR -- -- --     Basename 06/20/11 0500 06/19/11 0600 06/18/11 0550  NA 131* 131* 130*  K 3.4* 3.7 3.6  CL 99 98 98  CO2 26 25 24   GLUCOSE 132* 135* 117*  BUN 11 13 12   CREATININE 0.43* 0.45* 0.44*  LABCREA -- -- --  CREAT24HRUR -- -- --  CALCIUM 8.6 8.9 8.6  MG -- 1.5 --  PHOS -- 4.8* --  PROT -- -- --  ALBUMIN -- -- --  AST -- -- --  ALT -- -- --  ALKPHOS -- -- --  BILITOT -- -- --  BILIDIR -- -- --  IBILI -- -- --  PREALBUMIN -- -- --  TRIG -- -- --  CHOLHDL -- -- --  CHOL -- -- --   Estimated Creatinine Clearance: 60.3 ml/min (by C-G formula based on Cr of 0.43).    Basename 06/20/11 0753 06/20/11 0352 06/20/11 0012  GLUCAP 144* 135* 118*    Medications:  Scheduled:     . alteplase  2 mg Intracatheter Once  . cloNIDine  0.1 mg Transdermal Weekly  . feeding supplement  1 Container Oral TID WC  . imipenem-cilastatin  500 mg Intravenous Q8H  . lip balm  1 application Topical BID  . metoprolol  5 mg Intravenous Q12H  . pantoprazole (PROTONIX) IV  40 mg Intravenous Q12H    Insulin Requirements in the past 24 hours:  50 units regular insulin in TPN SSI  d/c d/t good CBG control  Current Nutrition:  Clear liquid diet TPN with Clinimix E 5/15 @ 80cc/hr, Lipids MWF- ~1600Kcal and 96 g protein daily  Assessment: 77 y/o previously on TPN (for erosive esophagitis with perforated pyloric ulcer s/p lap). S/p gastrostomy, gastrectomy and cholecystectomy 11/7.  TPN initially started 05/12/11 - 05/14/2011. 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 TPN ongoing d/t poor PO intake, changed to continuous 24h infusion d/t low Na. Diet is clear liquids currently.   GI Ongoing abd pain, small PO intake of clears. Noted re-CT abd without new abscesses, old fluid collections unchanged in size- plans are to watch output as PO intake incr. Noted possible plans to start TF via GT.  Endo CBG controlled 118-144, on insulin in TPN. No SSI coverage, would continue CBG checks d/t insulin in TPN (50 units/bag)  Lytes/Renal  Scr, UOP WNL. Na low, likely d/t overall fluid overload. K low. Other lytes ok. Acid-based status ok. NS at 50cc/hr. Cannot correct individual lytes in TPN d/t premade bags.  Hepatobil Trig and LFTs WNL. Prealbumin low for goal 18-40g/dl. Slow progress expected as  patient continues to have small PO intake. TPN provides goal protein and ~90% Kcals. Would await PO progress before increasing TPN further.  Pulm RA  Cardiology VSS  ID Afeb, WBC WNL. Primaxin#23 cont- all cx neg except Ecoli and PSA UTI 12/1- ? D/c Primaxin or chg to Cipro (PSA was sens), continued therapy does not seem warranted- WBC WNL, pt afebrile. Abscesses unchanged.  Px SQ hep, IV PPI    Nutritional Goals:  1785-2000 kCal, 95-110 grams of protein per day   Plan: 1. Continue Clinimix E 5/15 at 80 cc/hr *MVI/Trace/IV fats MWF d/t national shortage* 2. Will give KCl enterally today; will chg PPI to PO. BMET AM. 3. MD please address abx duration.  Bushra Denman K. Allena Katz, PharmD, BCPS.  Clinical Pharmacist Pager  [phone]. 06/20/2011 8:36 AM

## 2011-06-20 NOTE — Progress Notes (Signed)
Patient ID: [name], female   DOB: 01-15-1932, 77 y.o.   MRN: [id number] Subjective:  abdominal pain better, tolerating more POs  Objective:  Vital signs in last 24 hours:  Filed Vitals:   06/19/11 1005 06/19/11 1200 06/19/11 2125 06/20/11 0428  BP: 143/91 114/84 148/73 146/77  Pulse:  89 92 92  Temp:   99 F (37.2 C) 98.1 F (36.7 C)  TempSrc:   Oral Axillary  Resp:   20 20  Height:      Weight:      SpO2: 93%  94% 94%    Intake/Output from previous day:   Intake/Output Summary (Last 24 hours) at 06/20/11 1225 Last data filed at 06/20/11 1024  Gross per 24 hour  Intake 13318.83 ml  Output   1701 ml  Net 11617.83 ml    Physical Exam: General: Alert, awake, in no acute distress. HEENT: No bruits, no goiter. Moist mucous membranes, no scleral icterus, no conjunctival pallor. Heart: Regular rate and rhythm, S1/S2 +, no murmurs, rubs, gallops. Lungs: Clear to auscultation bilaterally. No wheezing, no rhonchi, no rales.  Abdomen: Soft, decreased tenderness especially in lower quadrants, no rebound, positive bowel sounds. Extremities: No clubbing or cyanosis, no pitting edema,  positive pedal pulses. Neuro: No focal neurologic deficits  Lab Results:  Basic Metabolic Panel:    Component Value Date/Time   NA 131* 06/20/2011 0500   K 3.4* 06/20/2011 0500   CL 99 06/20/2011 0500   CO2 26 06/20/2011 0500   BUN 11 06/20/2011 0500   CREATININE 0.43* 06/20/2011 0500   GLUCOSE 132* 06/20/2011 0500   CALCIUM 8.6 06/20/2011 0500   CBC:    Component Value Date/Time   WBC 4.4 06/20/2011 0500   HGB 9.4* 06/20/2011 0500   HCT 29.4* 06/20/2011 0500   PLT 351 06/20/2011 0500   MCV 85.5 06/20/2011 0500   NEUTROABS 3.7 06/15/2011 0800   LYMPHSABS 1.9 06/15/2011 0800   MONOABS 0.6 06/15/2011 0800   EOSABS 0.2 06/15/2011 0800   BASOSABS 0.0 06/15/2011 0800      Lab 06/20/11 0500 06/19/11 0600 06/18/11 0550 06/17/11 0540 06/15/11 0800  WBC 4.4 5.1 5.4 5.4 6.3  HGB  9.4* 9.7* 9.7* 9.8* 9.4*  HCT 29.4* 29.8* 30.1* 30.4* 29.1*  PLT 351 341 348 365 369  MCV 85.5 86.1 85.5 85.2 85.3  MCH 27.3 28.0 27.6 27.5 27.6  MCHC 32.0 32.6 32.2 32.2 32.3  RDW 17.1* 17.3* 17.4* 17.3* 17.7*  LYMPHSABS -- -- -- -- 1.9  MONOABS -- -- -- -- 0.6  EOSABS -- -- -- -- 0.2  BASOSABS -- -- -- -- 0.0  BANDABS -- -- -- -- --    Lab 06/20/11 0500 06/19/11 0600 06/18/11 0550 06/17/11 0540 06/16/11 0644 06/15/11 0800  NA 131* 131* 130* 131* 133* --  K 3.4* 3.7 3.6 3.4* 3.7 --  CL 99 98 98 98 100 --  CO2 26 25 24 25 24  --  GLUCOSE 132* 135* 117* 127* 135* --  BUN 11 13 12 13 14  --  CREATININE 0.43* 0.45* 0.44* 0.47* 0.51 --  CALCIUM 8.6 8.9 8.6 8.7 8.7 --  MG -- 1.5 -- -- -- 1.4*   No results found for this basename: INR:5,PROTIME:5 in the last 168 hours Cardiac markers:  No results found for this basename: CK:3,CKMB:3,TROPONINI:3,MYOGLOBIN:3 in the last 168 hours No components found with this basename: POCBNP:3 No results found for this or any previous visit (from the past 240 hour(s)).  Studies/Results: Dg  Abd 1 View  06/18/2011  *RADIOLOGY REPORT*  Clinical Data: Abdominal pain.  ABDOMEN - 1 VIEW  Comparison: Multiple priors.  Findings: Pigtail drainage catheter left upper quadrant appears unchanged.  Barium is seen throughout the colon.  There is a right total hip replacement.  There is no visible obstruction or free air.  IMPRESSION: No acute findings.  Original Report Authenticated By: Elsie Stain, M.D.    Medications: Scheduled Meds:    . alteplase  2 mg Intracatheter Once  . cloNIDine  0.1 mg Transdermal Weekly  . feeding supplement  1 Container Oral TID WC  . imipenem-cilastatin  500 mg Intravenous Q8H  . lip balm  1 application Topical BID  . metoprolol  5 mg Intravenous Q12H  . pantoprazole  40 mg Oral BID AC  . potassium chloride  40 mEq Per Tube Once  . DISCONTD: pantoprazole (PROTONIX) IV  40 mg Intravenous Q12H   Continuous Infusions:    .  sodium chloride 50 mL/hr at 06/20/11 0258  . TPN (CLINIMIX) +/- additives 80 mL/hr at 06/19/11 1728   And  . fat emulsion 240 mL (06/19/11 1729)  . TPN (CLINIMIX) +/- additives 80 mL/hr at 06/18/11 1756  . TPN (CLINIMIX) +/- additives     PRN Meds:.acetaminophen, hydrALAZINE, LORazepam, morphine injection, ondansetron (ZOFRAN) IV, phenol, sodium chloride  Assessment/Plan:  Intra-abdominal abscess Unchanged based on last CT, on Day #22 of Meropenem Management per CCS/IR   Esophagitis, erosive - continue protonix   DM:  Stable on SSI  Malnutrition on TNA   Deconditioning/ Disposition She needs SNF  Advance directives - Full code  Please consider transferring to Surgical service, since we have not added anything to her care for over 10days. Thank you     LOS: 45 days   Farha Dano 06/20/2011, 12:25 PM  [facility] Pager: [phone]

## 2011-06-21 LAB — BASIC METABOLIC PANEL
Calcium: 8.7 mg/dL (ref 8.4–10.5)
Chloride: 98 mEq/L (ref 96–112)
Creatinine, Ser: 0.4 mg/dL — ABNORMAL LOW (ref 0.50–1.10)
GFR calc Af Amer: >90 mL/min (ref >90)
Sodium: 132 mEq/L — ABNORMAL LOW (ref 135–145)

## 2011-06-21 LAB — CBC
MCH: 27.1 pg (ref 26.0–34.0)
MCV: 85.2 fL (ref 78.0–100.0)
Platelets: 359 10*3/uL (ref 150–400)
RBC: 3.51 MIL/uL — ABNORMAL LOW (ref 3.87–5.11)
RDW: 16.9 % — ABNORMAL HIGH (ref 11.5–15.5)
WBC: 5.1 10*3/uL (ref 4.0–10.5)

## 2011-06-21 LAB — GLUCOSE, CAPILLARY
Glucose-Capillary: 137 mg/dL — ABNORMAL HIGH (ref 70–99)
Glucose-Capillary: 139 mg/dL — ABNORMAL HIGH (ref 70–99)

## 2011-06-21 MED ORDER — ENSURE IMMUNE HEALTH PO LIQD
237.0000 mL | Freq: Three times a day (TID) | ORAL | Status: DC
  Administered 2011-06-21 (×2): 237 mL via ORAL
  Administered 2011-06-22 (×3): via ORAL
  Administered 2011-06-23 – 2011-07-02 (×12): 237 mL via ORAL
  Administered 2011-07-03: 09:00:00 via ORAL
  Administered 2011-07-04 – 2011-07-06 (×7): 237 mL via ORAL
  Filled 2011-06-21: qty 237

## 2011-06-21 MED ORDER — CLINIMIX E/DEXTROSE (5/15) 5 % IV SOLN
INTRAVENOUS | Status: AC
  Administered 2011-06-21: 17:00:00 via INTRAVENOUS
  Filled 2011-06-21: qty 2000

## 2011-06-21 MED ORDER — POTASSIUM CHLORIDE 10 MEQ/50ML IV SOLN
10.0000 meq | INTRAVENOUS | Status: AC
  Administered 2011-06-21 (×4): 10 meq via INTRAVENOUS
  Filled 2011-06-21 (×4): qty 50

## 2011-06-21 NOTE — Progress Notes (Signed)
PARENTERAL NUTRITION CONSULT NOTE - FOLLOW UP  Pharmacy Consult for TPN Indication: Not tolerating TF  Allergies  Allergen Reactions  . Other Swelling    Pinto beans cause swelling and hives  . Penicillins Swelling    Patient Measurements: Height: 5\' 6"  (167.6 cm) Weight: 161 lb 6 oz (73.2 kg) IBW/kg (Calculated) : 59.3    Vital Signs: Temp: 98.1 F (36.7 C) (12/23 0429) Temp src: Oral (12/23 0429) BP: 146/90 mmHg (12/23 0429) Pulse Rate: 83  (12/23 0429) Intake/Output from previous day: 12/22 0701 - 12/23 0700 In: 5462 [P.O.:150; I.V.:5112] Out: 2226 [Urine:2225; Stool:1] Intake/Output from this shift:    Labs:  Basename 06/21/11 0500 06/20/11 0500 06/19/11 0600  WBC 5.1 4.4 5.1  HGB 9.5* 9.4* 9.7*  HCT 29.9* 29.4* 29.8*  PLT 359 351 341  APTT -- -- --  INR -- -- --     Basename 06/21/11 0500 06/20/11 0500 06/19/11 0600  NA 132* 131* 131*  K 3.4* 3.4* 3.7  CL 98 99 98  CO2 25 26 25   GLUCOSE 132* 132* 135*  BUN 12 11 13   CREATININE 0.40* 0.43* 0.45*  LABCREA -- -- --  CREAT24HRUR -- -- --  CALCIUM 8.7 8.6 8.9  MG -- -- 1.5  PHOS -- -- 4.8*  PROT -- -- --  ALBUMIN -- -- --  AST -- -- --  ALT -- -- --  ALKPHOS -- -- --  BILITOT -- -- --  BILIDIR -- -- --  IBILI -- -- --  PREALBUMIN -- -- --  TRIG -- -- --  CHOLHDL -- -- --  CHOL -- -- --   Estimated Creatinine Clearance: 60.3 ml/min (by C-G formula based on Cr of 0.4).    Basename 06/21/11 0746 06/21/11 0424 06/20/11 2324  GLUCAP 137* 128* 130*    Medications:  Scheduled:     . alteplase  2 mg Intracatheter Once  . cloNIDine  0.1 mg Transdermal Weekly  . feeding supplement  1 Container Oral TID WC  . imipenem-cilastatin  500 mg Intravenous Q8H  . lip balm  1 application Topical BID  . metoprolol  5 mg Intravenous Q12H  . pantoprazole  40 mg Oral BID AC  . potassium chloride  10 mEq Intravenous Q1 Hr x 4    Insulin Requirements in the past 24 hours:  50 units regular insulin in  TPN SSI d/c d/t good CBG control  Current Nutrition:  Clear liquid diet TPN with Clinimix E 5/15 @ 80cc/hr, Lipids MWF- ~1600Kcal and 96 g protein daily  Assessment: 77 y/o previously on TPN (for erosive esophagitis with perforated pyloric ulcer s/p lap). S/p gastrostomy, gastrectomy and cholecystectomy 11/7.  TPN initially started 05/12/11 - 05/14/2011. 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 TPN ongoing d/t poor PO intake, changed to continuous 24h infusion d/t low Na. Diet is clear liquids currently.   GI Ongoing abd pain, small PO intake of clears. Noted re-CT abd without new abscesses, old fluid collections unchanged in size.   Endo CBG controlled 128-137, on insulin in TPN. No SSI coverage, would continue CBG checks d/t insulin in TPN (50 units/bag)  Lytes/Renal  Scr, UOP WNL. Na low, likely d/t overall fluid overload. K low-did not budge with PO repletion 12/22. Other lytes ok. Acid-based status ok. NS at 50cc/hr. Cannot correct individual lytes in TPN d/t premade bags.  Hepatobil Trig and LFTs WNL. Prealbumin low for goal 18-40g/dl. Slow progress expected as patient  continues to have small PO intake. TPN provides goal protein and ~90% Kcals. Would await PO progress before increasing TPN further.  Pulm RA  Cardiology VSS  ID Afeb, WBC WNL. Primaxin#24 cont- all cx neg except Ecoli and PSA UTI 12/1- ? D/c Primaxin or chg to Cipro (PSA was sens), continued therapy does not seem warranted- WBC WNL, pt afebrile. Abscesses unchanged.  Px SQ hep, IV PPI    Nutritional Goals:  1785-2000 kCal, 95-110 grams of protein per day   Plan: 1. Continue Clinimix E 5/15 at 80 cc/hr *MVI/Trace/IV fats MWF d/t national shortage* 2. Will give KCl IV today. 3. MD please address abx duration.  Donyea Gafford K. Allena Katz, PharmD, BCPS.  Clinical Pharmacist Pager [phone]. 06/21/2011 9:57 AM

## 2011-06-21 NOTE — Progress Notes (Signed)
Patient ID: [name], female   DOB: 31-Jan-1932, 77 y.o.   MRN: [id number] Subjective: abdominal pain better, tolerating more POs  Objective:  Vital signs in last 24 hours:  Filed Vitals:   06/20/11 0428 06/20/11 1846 06/20/11 2256 06/21/11 0429  BP: 146/77 153/85 151/69 146/90  Pulse: 92 96 85 83  Temp: 98.1 F (36.7 C) 99.1 F (37.3 C) 96.8 F (36 C) 98.1 F (36.7 C)  TempSrc: Axillary Oral Axillary Oral  Resp: 20 20 20 20   Height:      Weight:      SpO2: 94% 90% 96% 95%    Intake/Output from previous day:   Intake/Output Summary (Last 24 hours) at 06/21/11 1252 Last data filed at 06/21/11 0700  Gross per 24 hour  Intake   5342 ml  Output   1601 ml  Net   3741 ml    Physical Exam: General: Alert, awake, in no acute distress. HEENT: No bruits, no goiter. Moist mucous membranes, no scleral icterus, no conjunctival pallor. Heart: Regular rate and rhythm, S1/S2 +, no murmurs, rubs, gallops. Lungs: Clear to auscultation bilaterally. No wheezing, no rhonchi, no rales.  Abdomen: Soft, decreased tenderness especially in lower quadrants, no rebound, positive bowel sounds. Extremities: No clubbing or cyanosis, no pitting edema,  positive pedal pulses. Neuro: No focal neurologic deficits  Lab Results:  Basic Metabolic Panel:    Component Value Date/Time   NA 132* 06/21/2011 0500   K 3.4* 06/21/2011 0500   CL 98 06/21/2011 0500   CO2 25 06/21/2011 0500   BUN 12 06/21/2011 0500   CREATININE 0.40* 06/21/2011 0500   GLUCOSE 132* 06/21/2011 0500   CALCIUM 8.7 06/21/2011 0500   CBC:    Component Value Date/Time   WBC 5.1 06/21/2011 0500   HGB 9.5* 06/21/2011 0500   HCT 29.9* 06/21/2011 0500   PLT 359 06/21/2011 0500   MCV 85.2 06/21/2011 0500   NEUTROABS 3.7 06/15/2011 0800   LYMPHSABS 1.9 06/15/2011 0800   MONOABS 0.6 06/15/2011 0800   EOSABS 0.2 06/15/2011 0800   BASOSABS 0.0 06/15/2011 0800      Lab 06/21/11 0500 06/20/11 0500 06/19/11 0600 06/18/11  0550 06/17/11 0540 06/15/11 0800  WBC 5.1 4.4 5.1 5.4 5.4 --  HGB 9.5* 9.4* 9.7* 9.7* 9.8* --  HCT 29.9* 29.4* 29.8* 30.1* 30.4* --  PLT 359 351 341 348 365 --  MCV 85.2 85.5 86.1 85.5 85.2 --  MCH 27.1 27.3 28.0 27.6 27.5 --  MCHC 31.8 32.0 32.6 32.2 32.2 --  RDW 16.9* 17.1* 17.3* 17.4* 17.3* --  LYMPHSABS -- -- -- -- -- 1.9  MONOABS -- -- -- -- -- 0.6  EOSABS -- -- -- -- -- 0.2  BASOSABS -- -- -- -- -- 0.0  BANDABS -- -- -- -- -- --    Lab 06/21/11 0500 06/20/11 0500 06/19/11 0600 06/18/11 0550 06/17/11 0540 06/15/11 0800  NA 132* 131* 131* 130* 131* --  K 3.4* 3.4* 3.7 3.6 3.4* --  CL 98 99 98 98 98 --  CO2 25 26 25 24 25  --  GLUCOSE 132* 132* 135* 117* 127* --  BUN 12 11 13 12 13  --  CREATININE 0.40* 0.43* 0.45* 0.44* 0.47* --  CALCIUM 8.7 8.6 8.9 8.6 8.7 --  MG -- -- 1.5 -- -- 1.4*   No results found for this basename: INR:5,PROTIME:5 in the last 168 hours Cardiac markers:  No results found for this basename: CK:3,CKMB:3,TROPONINI:3,MYOGLOBIN:3 in the last 168 hours  No components found with this basename: POCBNP:3 No results found for this or any previous visit (from the past 240 hour(s)).  Studies/Results: No results found.  Medications: Scheduled Meds:    . alteplase  2 mg Intracatheter Once  . cloNIDine  0.1 mg Transdermal Weekly  . feeding supplement  237 mL Oral TID WC  . feeding supplement  1 Container Oral TID WC  . imipenem-cilastatin  500 mg Intravenous Q8H  . lip balm  1 application Topical BID  . metoprolol  5 mg Intravenous Q12H  . pantoprazole  40 mg Oral BID AC  . potassium chloride  10 mEq Intravenous Q1 Hr x 4   Continuous Infusions:    . sodium chloride 50 mL/hr at 06/20/11 2342  . TPN (CLINIMIX) +/- additives 80 mL/hr at 06/19/11 1728   And  . fat emulsion 240 mL (06/19/11 1729)  . TPN (CLINIMIX) +/- additives 80 mL/hr at 06/20/11 1803  . TPN (CLINIMIX) +/- additives     PRN Meds:.acetaminophen, hydrALAZINE, LORazepam, morphine  injection, ondansetron (ZOFRAN) IV, phenol, sodium chloride  Assessment/Plan:  Intra-abdominal abscess Unchanged based on last CT, on Day #22 of Meropenem Clinically improving, diet being advanced Management per CCS/IR   Esophagitis, erosive - continue protonix   DM:  Stable on SSI  Malnutrition on TNA   Deconditioning/ Disposition She needs SNF  Advance directives - Full code      LOS: 46 days   Yannick Steuber 06/21/2011, 12:52 PM  [facility] Pager: [phone]

## 2011-06-21 NOTE — Progress Notes (Signed)
45 Days Post-Op  Subjective: No new pain. Pain seems to have faded away past few days. She's not taking in much clears  Objective: Vital signs in last 24 hours: Temp:  [96.8 F (36 C)-99.1 F (37.3 C)] 98.1 F (36.7 C) (12/23 0429) Pulse Rate:  [83-96] 83  (12/23 0429) Resp:  [20] 20  (12/23 0429) BP: (146-153)/(69-90) 146/90 mmHg (12/23 0429) SpO2:  [90 %-96 %] 95 % (12/23 0429) Last BM Date: 06/19/11  Intake/Output this shift:    Physical Exam: BP 146/90  Pulse 83  Temp(Src) 98.1 F (36.7 C) (Oral)  Resp 20  Ht 5\' 6"  (1.676 m)  Wt 161 lb 6 oz (73.2 kg)  BMI 26.05 kg/m2  SpO2 95% Abdomen: wounds abd drains stable, no tenderness.  Labs: CBC  Basename 06/21/11 0500 06/20/11 0500  WBC 5.1 4.4  HGB 9.5* 9.4*  HCT 29.9* 29.4*  PLT 359 351   BMET  Basename 06/21/11 0500 06/20/11 0500  NA 132* 131*  K 3.4* 3.4*  CL 98 99  CO2 25 26  GLUCOSE 132* 132*  BUN 12 11  CREATININE 0.40* 0.43*  CALCIUM 8.7 8.6   LFT No results found for this basename: PROT,ALBUMIN,AST,ALT,ALKPHOS,BILITOT,BILIDIR,IBILI,LIPASE in the last 72 hours PT/INR No results found for this basename: LABPROT:2,INR:2 in the last 72 hours ABG No results found for this basename: PHART:2,PCO2:2,PO2:2,HCO3:2 in the last 72 hours  Studies/Results: No results found.  Assessment: Principal Problem:  *Intra-abdominal abscess Active Problems:  DIABETES MELLITUS, TYPE II  HYPERTENSION  Anemia  Hyponatremia  Abdominal pain, acute  Metabolic acidosis  Esophagitis, erosive  Acute respiratory failure   Procedure(s): EXPLORATORY LAPAROTOMY GASTROSTOMY GASTRECTOMY CHOLECYSTECTOMY  Plan: Will advance to full liquids and encourage Ensure tid Repeat CT scan sometime this week. Getting close to DC?  LOS: 46 days    Sherrol Vicars J 06/21/2011

## 2011-06-22 LAB — CBC
MCH: 27.4 pg (ref 26.0–34.0)
MCHC: 32 g/dL (ref 30.0–36.0)
Platelets: 352 10*3/uL (ref 150–400)
RDW: 16.9 % — ABNORMAL HIGH (ref 11.5–15.5)

## 2011-06-22 LAB — COMPREHENSIVE METABOLIC PANEL
ALT: 23 U/L (ref 0–35)
AST: 24 U/L (ref 0–37)
Albumin: 2 g/dL — ABNORMAL LOW (ref 3.5–5.2)
Calcium: 8.9 mg/dL (ref 8.4–10.5)
GFR calc Af Amer: >90 mL/min (ref >90)
Glucose, Bld: 152 mg/dL — ABNORMAL HIGH (ref 70–99)
Sodium: 129 mEq/L — ABNORMAL LOW (ref 135–145)
Total Protein: 6.6 g/dL (ref 6.0–8.3)

## 2011-06-22 LAB — PREALBUMIN: Prealbumin: 15 mg/dL — ABNORMAL LOW (ref 17.0–34.0)

## 2011-06-22 LAB — PHOSPHORUS: Phosphorus: 4.9 mg/dL — ABNORMAL HIGH (ref 2.3–4.6)

## 2011-06-22 LAB — DIFFERENTIAL
Basophils Absolute: 0 10*3/uL (ref 0.0–0.1)
Lymphocytes Relative: 31 % (ref 12–46)
Lymphs Abs: 1.9 10*3/uL (ref 0.7–4.0)
Monocytes Absolute: 0.6 10*3/uL (ref 0.1–1.0)
Neutro Abs: 3.6 10*3/uL (ref 1.7–7.7)

## 2011-06-22 LAB — GLUCOSE, CAPILLARY
Glucose-Capillary: 125 mg/dL — ABNORMAL HIGH (ref 70–99)
Glucose-Capillary: 149 mg/dL — ABNORMAL HIGH (ref 70–99)
Glucose-Capillary: 135 mg/dL — ABNORMAL HIGH (ref 70–99)

## 2011-06-22 MED ORDER — TRACE MINERALS CR-CU-MN-SE-ZN 10-1000-500-60 MCG/ML IV SOLN: INTRAVENOUS | Status: DC

## 2011-06-22 MED ORDER — ALTEPLASE 2 MG IJ SOLR: 2.0000 mg | Freq: Once | INTRAMUSCULAR | Status: DC

## 2011-06-22 MED ORDER — OXYCODONE HCL 5 MG/5ML PO SOLN
5.0000 mg | ORAL | Status: DC | PRN
  Administered 2011-06-22 – 2011-07-06 (×18): 5 mg via ORAL
  Filled 2011-06-22 (×19): qty 5

## 2011-06-22 MED ORDER — ALTEPLASE 2 MG IJ SOLR
4.0000 mg | Freq: Once | INTRAMUSCULAR | Status: AC
  Administered 2011-06-22: 4 mg
  Filled 2011-06-22: qty 4

## 2011-06-22 MED ORDER — TRACE MINERALS CR-CU-MN-SE-ZN 10-1000-500-60 MCG/ML IV SOLN
INTRAVENOUS | Status: AC
  Administered 2011-06-22: 18:00:00 via INTRAVENOUS
  Filled 2011-06-22: qty 2000

## 2011-06-22 MED ORDER — FAT EMULSION 20 % IV EMUL
240.0000 mL | INTRAVENOUS | Status: AC
  Administered 2011-06-22: 240 mL via INTRAVENOUS
  Filled 2011-06-22: qty 250

## 2011-06-22 MED ORDER — ALTEPLASE 2 MG IJ SOLR
2.0000 mg | Freq: Once | INTRAMUSCULAR | Status: DC
  Administered 2011-06-22: 2 mg

## 2011-06-22 MED ORDER — METOPROLOL TARTRATE 12.5 MG HALF TABLET
12.5000 mg | ORAL_TABLET | Freq: Two times a day (BID) | ORAL | Status: DC
  Administered 2011-06-22 – 2011-07-06 (×28): 12.5 mg via ORAL
  Filled 2011-06-22 (×31): qty 1

## 2011-06-22 NOTE — Progress Notes (Signed)
46 Days Post-Op  Subjective: Patient resting quietly , denies abdominal pain  Objective: Vital signs in last 24 hours: Temp:  [98.9 F (37.2 C)-99.7 F (37.6 C)] 99.3 F (37.4 C) (12/24 0417) Pulse Rate:  [87-96] 87  (12/24 0417) Resp:  [20-22] 20  (12/24 0417) BP: (140-152)/(82-88) 140/82 mmHg (12/24 0417) SpO2:  [98 %-100 %] 99 % (12/24 0417) Last BM Date: 06/19/11  Intake/Output from previous day: 12/23 0701 - 12/24 0700 In: 1759 [P.O.:60; I.V.:1699] Out: 700 [Urine:700] Intake/Output this shift:    Epigastric drain intact, output not in computer, although about 25 cc's beige fluid in bulb; drain flushed with 10 cc's sterile NS, abd soft  Lab Results:   Basename 06/22/11 0921 06/21/11 0500  WBC 6.2 5.1  HGB 10.3* 9.5*  HCT 32.2* 29.9*  PLT 352 359   BMET  Basename 06/21/11 0500 06/20/11 0500  NA 132* 131*  K 3.4* 3.4*  CL 98 99  CO2 25 26  GLUCOSE 132* 132*  BUN 12 11  CREATININE 0.40* 0.43*  CALCIUM 8.7 8.6   PT/INR No results found for this basename: LABPROT:2,INR:2 in the last 72 hours ABG No results found for this basename: PHART:2,PCO2:2,PO2:2,HCO3:2 in the last 72 hours  Studies/Results: Results for orders placed during the hospital encounter of 05/06/11  SURGICAL PCR SCREEN     Status: Abnormal   Collection Time   05/07/11  9:30 AM      Component Value Range Status Comment   MRSA, PCR NEGATIVE  NEGATIVE  Final    Staphylococcus aureus POSITIVE (*) NEGATIVE  Final   URINE CULTURE     Status: Normal   Collection Time   05/07/11 10:20 AM      Component Value Range Status Comment   Specimen Description URINE, RANDOM   Final    Special Requests NONE   Final    Setup Time [id number]   Final    Colony Count NO GROWTH   Final    Culture NO GROWTH   Final    Report Status 05/08/2011 FINAL   Final   CULTURE, BLOOD (ROUTINE X 2)     Status: Normal   Collection Time   05/07/11 11:20 AM      Component Value Range Status Comment   Specimen  Description BLOOD   Final    Special Requests     Final    Value: BOTTLES DRAWN AEROBIC AND ANAEROBIC 5CC RT SUBCLAVIAN VEIN   Setup Time [id number]   Final    Culture NO GROWTH 5 DAYS   Final    Report Status 05/13/2011 FINAL   Final   CULTURE, BLOOD (ROUTINE X 2)     Status: Normal   Collection Time   05/07/11 11:20 AM      Component Value Range Status Comment   Specimen Description BLOOD   Final    Special Requests     Final    Value: BOTTLES DRAWN AEROBIC AND ANAEROBIC 5CC RT SUBCLAVIAN VEIN   Setup Time [id number]   Final    Culture NO GROWTH 5 DAYS   Final    Report Status 05/13/2011 FINAL   Final   CULTURE, RESPIRATORY     Status: Normal   Collection Time   05/13/11  6:02 AM      Component Value Range Status Comment   Specimen Description ENDOTRACHEAL   Final    Special Requests NONE   Final    Gram Stain     Final  Value: FEW WBC PRESENT, PREDOMINANTLY PMN     RARE SQUAMOUS EPITHELIAL CELLS PRESENT     NO ORGANISMS SEEN   Culture NO GROWTH 2 DAYS   Final    Report Status 05/15/2011 FINAL   Final   URINE CULTURE     Status: Normal   Collection Time   05/30/11  5:34 AM      Component Value Range Status Comment   Specimen Description URINE, CATHETERIZED   Final    Special Requests NONE   Final    Setup Time [id number]   Final    Colony Count >=100,000 COLONIES/ML   Final    Culture     Final    Value: PSEUDOMONAS AERUGINOSA     ESCHERICHIA COLI   Report Status 06/02/2011 FINAL   Final    Organism ID, Bacteria PSEUDOMONAS AERUGINOSA   Final    Organism ID, Bacteria ESCHERICHIA COLI   Final   CULTURE, ROUTINE-ABSCESS     Status: Normal   Collection Time   05/31/11  1:29 PM      Component Value Range Status Comment   Specimen Description ABSCESS ABDOMEN RUQ   Final    Special Requests PATIENT ON FOLLOWING VANC, CIRPO, PRIMAXIN   Final    Gram Stain     Final    Value: NO WBC SEEN     NO SQUAMOUS EPITHELIAL CELLS SEEN     NO ORGANISMS SEEN   Culture NO GROWTH  3 DAYS   Final    Report Status 06/03/2011 FINAL   Final     Anti-infectives: Anti-infectives     Start     Dose/Rate Route Frequency Ordered Stop   06/03/11 2115   ciprofloxacin (CIPRO) IVPB 400 mg  Status:  Discontinued     Comments: Please mix with NS.      400 mg 200 mL/hr over 60 Minutes Intravenous Every 12 hours 06/03/11 2114 06/03/11 2121   05/30/11 1130   ciprofloxacin (CIPRO) IVPB 400 mg  Status:  Discontinued        400 mg 200 mL/hr over 60 Minutes Intravenous 2 times daily 05/30/11 1038 06/02/11 1618   05/29/11 2100   imipenem-cilastatin (PRIMAXIN) 500 mg in sodium chloride 0.9 % 100 mL IVPB        500 mg 200 mL/hr over 30 Minutes Intravenous Every 8 hours 05/29/11 2007     05/29/11 2100   vancomycin (VANCOCIN) 1,250 mg in sodium chloride 0.9 % 250 mL IVPB  Status:  Discontinued        1,250 mg 166.7 mL/hr over 90 Minutes Intravenous Every 24 hours 05/29/11 2007 06/03/11 1006   05/07/11 1400   imipenem-cilastatin (PRIMAXIN) 500 mg in sodium chloride 0.9 % 100 mL IVPB        500 mg 200 mL/hr over 30 Minutes Intravenous 3 times per day 05/07/11 1106 05/21/11 1359   05/07/11 1200   micafungin (MYCAMINE) 100 mg in sodium chloride 0.9 % 100 mL IVPB  Status:  Discontinued     Comments: PER PHARMACY      100 mg 100 mL/hr over 1 Hours Intravenous Every 24 hours 05/07/11 1032 05/19/11 1103   05/07/11 1200   vancomycin (VANCOCIN) 1,250 mg in sodium chloride 0.9 % 250 mL IVPB  Status:  Discontinued        1,250 mg 166.7 mL/hr over 90 Minutes Intravenous Every 24 hours 05/07/11 1106 05/19/11 1103   05/07/11 1000   micafungin (MYCAMINE) 150 mg in sodium  chloride 0.9 % 100 mL IVPB  Status:  Discontinued     Comments: PER PHARMACY      150 mg 100 mL/hr over 1 Hours Intravenous Daily 05/07/11 0951 05/07/11 1029   05/07/11 0300   ertapenem (INVANZ) 1 g in sodium chloride 0.9 % 50 mL IVPB        1 g 100 mL/hr over 30 Minutes Intravenous  Once 05/07/11 0300 05/07/11 0348    05/07/11 0130   cefTRIAXone (ROCEPHIN) 1 g in dextrose 5 % 50 mL IVPB        1 g 100 mL/hr over 30 Minutes Intravenous  Once 05/07/11 0122 05/07/11 0200          Assessment/Plan: s/p epigastric abscess drainage 12/2; check f/u CT this week.    LOS: 47 days    Levie Owensby,D [facility] 06/22/2011

## 2011-06-22 NOTE — Progress Notes (Signed)
PARENTERAL NUTRITION CONSULT NOTE - FOLLOW UP  Pharmacy Consult for TPN Indication: Not tolerating TF  Allergies  Allergen Reactions  . Other Swelling    Pinto beans cause swelling and hives  . Penicillins Swelling    Patient Measurements: Height: 5\' 6"  (167.6 cm) Weight: 161 lb 6 oz (73.2 kg) IBW/kg (Calculated) : 59.3    Vital Signs: Temp: 99.3 F (37.4 C) (12/24 0417) Temp src: Oral (12/24 0417) BP: 138/87 mmHg (12/24 1106) Pulse Rate: 91  (12/24 1106) Intake/Output from previous day: 12/23 0701 - 12/24 0700 In: 1759 [P.O.:60; I.V.:1699] Out: 700 [Urine:700] Intake/Output from this shift: Total I/O In: -  Out: 25 [Drains:25]  Labs:  [facility] 06/22/11 0921 06/21/11 0500 06/20/11 0500  WBC 6.2 5.1 4.4  HGB 10.3* 9.5* 9.4*  HCT 32.2* 29.9* 29.4*  PLT 352 359 351  APTT -- -- --  INR -- -- --     Basename 06/22/11 0921 06/21/11 0500 06/20/11 0500  NA 129* 132* 131*  K 4.2 3.4* 3.4*  CL 97 98 99  CO2 23 25 26   GLUCOSE 152* 132* 132*  BUN 14 12 11   CREATININE 0.44* 0.40* 0.43*  LABCREA -- -- --  CREAT24HRUR -- -- --  CALCIUM 8.9 8.7 8.6  MG 1.5 -- --  PHOS 4.9* -- --  PROT 6.6 -- --  ALBUMIN 2.0* -- --  AST 24 -- --  ALT 23 -- --  ALKPHOS 94 -- --  BILITOT 0.2* -- --  BILIDIR -- -- --  IBILI -- -- --  PREALBUMIN -- -- --  TRIG 121 -- --  CHOLHDL -- -- --  CHOL 125 -- --   Estimated Creatinine Clearance: 60.3 ml/min (by C-G formula based on Cr of 0.44).    Basename 06/22/11 0806 06/22/11 0414 06/22/11 0004  GLUCAP 149* 125* 135*    Medications:  Scheduled:     . alteplase  2 mg Intracatheter Once  . alteplase  4 mg Intracatheter Once  . cloNIDine  0.1 mg Transdermal Weekly  . feeding supplement  237 mL Oral TID WC  . feeding supplement  1 Container Oral TID WC  . imipenem-cilastatin  500 mg Intravenous Q8H  . lip balm  1 application Topical BID  . metoprolol tartrate  12.5 mg Oral BID  . pantoprazole  40 mg Oral BID AC  . potassium  chloride  10 mEq Intravenous Q1 Hr x 4  . DISCONTD: alteplase  2 mg Intracatheter Once  . DISCONTD: alteplase  2 mg Intracatheter Once  . DISCONTD: metoprolol  5 mg Intravenous Q12H    Insulin Requirements in the past 24 hours:  50 units regular insulin in TPN SSI d/c d/t good CBG control  Current Nutrition:  Clear liquid diet TPN with Clinimix E 5/15 @ 80cc/hr, Lipids MWF- ~1600Kcal and 96 g protein daily  Assessment: 77 y/o previously on TPN (for erosive esophagitis with perforated pyloric ulcer s/p lap). S/p gastrostomy, gastrectomy and cholecystectomy 11/7.  TPN initially started 05/12/11 - 05/14/2011. 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 TPN ongoing d/t poor PO intake, changed to continuous 24h infusion d/t low Na. Diet is clear liquids currently.   GI Ongoing abd pain, small PO intake of clears. Noted re-CT abd without new abscesses, old fluid collections unchanged in size.   Endo CBG controlled 125-149, on insulin in TPN. No SSI coverage, would continue CBG checks d/t insulin in TPN (50 units/bag)  Lytes/Renal  Scr,  UOP WNL. Na low, likely d/t overall fluid overload. Phos slightly high. Other lytes ok. Acid-based status ok. NS at 50cc/hr. Cannot correct individual lytes in TPN d/t premade bags.  Hepatobil Trig and LFTs WNL. Prealbumin pending.  Pulm RA  Cardiology VSS  ID Afeb, WBC WNL. Primaxin#25 cont- all cx neg except Ecoli and PSA UTI 12/1- ? D/c Primaxin or chg to Cipro (PSA was sens), continued therapy does not seem warranted- WBC WNL, pt afebrile. Abscesses unchanged- noted plans to re-CT later this week.  Px SQ hep, IV PPI    Nutritional Goals:  1785-2000 kCal, 95-110 grams of protein per day   Plan: 1. Continue Clinimix E 5/15 at 80 cc/hr *MVI/Trace/IV fats MWF d/t national shortage* 2. MD please address abx duration.  Jewelianna Pancoast K. Allena Katz, PharmD, BCPS.  Clinical Pharmacist Pager  [phone]. 06/22/2011 11:21 AM

## 2011-06-22 NOTE — Progress Notes (Signed)
Patient ID: [name], female   DOB: 1931-08-01, 76 y.o.   MRN: [id number] Subjective: No new complaints, PICC  Line port not working  Objective:  Vital signs in last 24 hours:  Filed Vitals:   06/21/11 1400 06/21/11 2214 06/22/11 0417 06/22/11 1106  BP: 151/85 152/88 140/82 138/87  Pulse: 96 92 87 91  Temp: 99.7 F (37.6 C) 98.9 F (37.2 C) 99.3 F (37.4 C)   TempSrc: Oral Oral Oral   Resp: 20 22 20    Height:      Weight:      SpO2: 100% 98% 99%     Intake/Output from previous day:   Intake/Output Summary (Last 24 hours) at 06/22/11 1408 Last data filed at 06/22/11 1102  Gross per 24 hour  Intake   1759 ml  Output    725 ml  Net   1034 ml    Physical Exam: General: Alert, awake, in no acute distress. HEENT: No bruits, no goiter. Moist mucous membranes, no scleral icterus, no conjunctival pallor. Heart: Regular rate and rhythm, S1/S2 +, no murmurs, rubs, gallops. Lungs: Clear to auscultation bilaterally. No wheezing, no rhonchi, no rales.  Abdomen: Soft, decreased tenderness especially in lower quadrants, no rebound, positive bowel sounds. Extremities: No clubbing or cyanosis, no pitting edema,  positive pedal pulses. Neuro: No focal neurologic deficits  Lab Results:  Basic Metabolic Panel:    Component Value Date/Time   NA 129* 06/22/2011 0921   K 4.2 06/22/2011 0921   CL 97 06/22/2011 0921   CO2 23 06/22/2011 0921   BUN 14 06/22/2011 0921   CREATININE 0.44* 06/22/2011 0921   GLUCOSE 152* 06/22/2011 0921   CALCIUM 8.9 06/22/2011 0921   CBC:    Component Value Date/Time   WBC 6.2 06/22/2011 0921   HGB 10.3* 06/22/2011 0921   HCT 32.2* 06/22/2011 0921   PLT 352 06/22/2011 0921   MCV 85.6 06/22/2011 0921   NEUTROABS 3.6 06/22/2011 0921   LYMPHSABS 1.9 06/22/2011 0921   MONOABS 0.6 06/22/2011 0921   EOSABS 0.2 06/22/2011 0921   BASOSABS 0.0 06/22/2011 0921      Lab 06/22/11 0921 06/21/11 0500 06/20/11 0500 06/19/11 0600 06/18/11 0550  WBC 6.2  5.1 4.4 5.1 5.4  HGB 10.3* 9.5* 9.4* 9.7* 9.7*  HCT 32.2* 29.9* 29.4* 29.8* 30.1*  PLT 352 359 351 341 348  MCV 85.6 85.2 85.5 86.1 85.5  MCH 27.4 27.1 27.3 28.0 27.6  MCHC 32.0 31.8 32.0 32.6 32.2  RDW 16.9* 16.9* 17.1* 17.3* 17.4*  LYMPHSABS 1.9 -- -- -- --  MONOABS 0.6 -- -- -- --  EOSABS 0.2 -- -- -- --  BASOSABS 0.0 -- -- -- --  BANDABS -- -- -- -- --    Lab 06/22/11 0921 06/21/11 0500 06/20/11 0500 06/19/11 0600 06/18/11 0550  NA 129* 132* 131* 131* 130*  K 4.2 3.4* 3.4* 3.7 3.6  CL 97 98 99 98 98  CO2 23 25 26 25 24   GLUCOSE 152* 132* 132* 135* 117*  BUN 14 12 11 13 12   CREATININE 0.44* 0.40* 0.43* 0.45* 0.44*  CALCIUM 8.9 8.7 8.6 8.9 8.6  MG 1.5 -- -- 1.5 --   No results found for this basename: INR:5,PROTIME:5 in the last 168 hours Cardiac markers:  No results found for this basename: CK:3,CKMB:3,TROPONINI:3,MYOGLOBIN:3 in the last 168 hours No components found with this basename: POCBNP:3 No results found for this or any previous visit (from the past 240 hour(s)).  Studies/Results: No results  found.  Medications: Scheduled Meds:    . alteplase  2 mg Intracatheter Once  . alteplase  4 mg Intracatheter Once  . cloNIDine  0.1 mg Transdermal Weekly  . feeding supplement  237 mL Oral TID WC  . feeding supplement  1 Container Oral TID WC  . imipenem-cilastatin  500 mg Intravenous Q8H  . lip balm  1 application Topical BID  . metoprolol tartrate  12.5 mg Oral BID  . pantoprazole  40 mg Oral BID AC  . potassium chloride  10 mEq Intravenous Q1 Hr x 4  . DISCONTD: alteplase  2 mg Intracatheter Once  . DISCONTD: alteplase  2 mg Intracatheter Once  . DISCONTD: metoprolol  5 mg Intravenous Q12H   Continuous Infusions:    . sodium chloride 50 mL/hr at 06/20/11 2342  . fat emulsion    . TPN (CLINIMIX) +/- additives 80 mL/hr at 06/20/11 1803  . TPN (CLINIMIX) +/- additives 80 mL/hr at 06/21/11 1727  . TPN (CLINIMIX) +/- additives    . DISCONTD: TPN (CLINIMIX)  +/- additives     PRN Meds:.acetaminophen, hydrALAZINE, LORazepam, morphine injection, ondansetron (ZOFRAN) IV, oxyCODONE, phenol, sodium chloride  Assessment/Plan:  Intra-abdominal abscess Unchanged based on last CT, on Day #23 of Meropenem Clinically improving, diet being advanced, PICC exchange due to malfunction Management per CCS/IR ? Repeat CT   Esophagitis, erosive - continue protonix   DM:  Stable on SSI  Malnutrition on TNA   Deconditioning/ Disposition She needs SNF  Advance directives - Full code      LOS: 47 days   Gola Bribiesca 06/22/2011, 2:08 PM  [facility] Pager: [phone]

## 2011-06-22 NOTE — Progress Notes (Signed)
Patient ID: [name], female   DOB: [birth year], 77 y.o.   MRN: [id number] 46 Days Post-Op  Subjective: Denies abdominal pain  Objective: Vital signs in last 24 hours: Temp:  [98.9 F (37.2 C)-99.7 F (37.6 C)] 99.3 F (37.4 C) (12/24 0417) Pulse Rate:  [87-96] 87  (12/24 0417) Resp:  [20-22] 20  (12/24 0417) BP: (140-152)/(82-88) 140/82 mmHg (12/24 0417) SpO2:  [98 %-100 %] 99 % (12/24 0417) Last BM Date: 06/19/11  Intake/Output this shift:    Physical Exam: BP 140/82  Pulse 87  Temp(Src) 99.3 F (37.4 C) (Oral)  Resp 20  Ht 5\' 6"  (1.676 m)  Wt 161 lb 6 oz (73.2 kg)  BMI 26.05 kg/m2  SpO2 99% Abdomen soft, nontender, wounds stable  Labs: CBC  Basename 06/21/11 0500 06/20/11 0500  WBC 5.1 4.4  HGB 9.5* 9.4*  HCT 29.9* 29.4*  PLT 359 351   BMET  Basename 06/21/11 0500 06/20/11 0500  NA 132* 131*  K 3.4* 3.4*  CL 98 99  CO2 25 26  GLUCOSE 132* 132*  BUN 12 11  CREATININE 0.40* 0.43*  CALCIUM 8.7 8.6   LFT No results found for this basename: PROT,ALBUMIN,AST,ALT,ALKPHOS,BILITOT,BILIDIR,IBILI,LIPASE in the last 72 hours PT/INR No results found for this basename: LABPROT:2,INR:2 in the last 72 hours ABG No results found for this basename: PHART:2,PCO2:2,PO2:2,HCO3:2 in the last 72 hours  Studies/Results: No results found.  Assessment: Principal Problem:  *Intra-abdominal abscess Active Problems:  DIABETES MELLITUS, TYPE II  HYPERTENSION  Anemia  Hyponatremia  Abdominal pain, acute  Metabolic acidosis  Esophagitis, erosive  Acute respiratory failure   Procedure(s): EXPLORATORY LAPAROTOMY GASTROSTOMY GASTRECTOMY CHOLECYSTECTOMY  Plan: Continue to encourage po.  Ct later this week  LOS: 47 days    Jaycelynn Knickerbocker A 06/22/2011

## 2011-06-22 NOTE — Progress Notes (Signed)
Pt has triple lumen picc in the right arm that was placed Nov 15th;  IV Team has had to instill TPA 3 times in approx 4 days to attempt to get blood return for lab draws; tpa was instilled again this am, for 2 hours, but still no bld return noted from the red or white ports; suggest replacing picc line, especially if pt will be on TNA for nutrition;  Please advise;  Thank you.

## 2011-06-23 LAB — GLUCOSE, CAPILLARY
Glucose-Capillary: 132 mg/dL — ABNORMAL HIGH (ref 70–99)
Glucose-Capillary: 136 mg/dL — ABNORMAL HIGH (ref 70–99)
Glucose-Capillary: 139 mg/dL — ABNORMAL HIGH (ref 70–99)
Glucose-Capillary: 141 mg/dL — ABNORMAL HIGH (ref 70–99)

## 2011-06-23 LAB — CBC
HCT: 30.7 % — ABNORMAL LOW (ref 36.0–46.0)
Hemoglobin: 9.9 g/dL — ABNORMAL LOW (ref 12.0–15.0)
MCH: 27.6 pg (ref 26.0–34.0)
MCV: 85.5 fL (ref 78.0–100.0)
RBC: 3.59 MIL/uL — ABNORMAL LOW (ref 3.87–5.11)

## 2011-06-23 MED ORDER — INSULIN REGULAR HUMAN 100 UNIT/ML IJ SOLN
INTRAVENOUS | Status: AC
  Administered 2011-06-23: 18:00:00 via INTRAVENOUS
  Filled 2011-06-23: qty 2000

## 2011-06-23 MED ORDER — SODIUM CHLORIDE 0.9 % IV SOLN
INTRAVENOUS | Status: DC
  Administered 2011-06-27: 20 mL/h via INTRAVENOUS
  Administered 2011-06-28: 500 mL via INTRAVENOUS
  Administered 2011-06-30 – 2011-07-02 (×2): via INTRAVENOUS

## 2011-06-23 NOTE — Progress Notes (Signed)
47 Days Post-Op  Subjective: No complaints  Objective: Vital signs in last 24 hours: Temp:  [97.1 F (36.2 C)-99.2 F (37.3 C)] 97.4 F (36.3 C) (12/25 0537) Pulse Rate:  [88-97] 97  (12/25 0537) Resp:  [20] 20  (12/25 0537) BP: (132-155)/(75-87) 155/84 mmHg (12/25 0537) SpO2:  [98 %-100 %] 99 % (12/25 0537) Weight:  [174 lb 2.6 oz (79 kg)] 174 lb 2.6 oz (79 kg) (12/25 0500) Last BM Date: 06/23/11  Intake/Output from previous day: 12/24 0701 - 12/25 0700 In: 1435 [P.O.:1435] Out: 2550 [Urine:2525; Drains:25] Intake/Output this shift:    GI: soft, nontender. tolerating po's  Lab Results:   Basename 06/23/11 0440 06/22/11 0921  WBC 5.8 6.2  HGB 9.9* 10.3*  HCT 30.7* 32.2*  PLT 375 352   BMET  Basename 06/22/11 0921 06/21/11 0500  NA 129* 132*  K 4.2 3.4*  CL 97 98  CO2 23 25  GLUCOSE 152* 132*  BUN 14 12  CREATININE 0.44* 0.40*  CALCIUM 8.9 8.7   PT/INR No results found for this basename: LABPROT:2,INR:2 in the last 72 hours ABG No results found for this basename: PHART:2,PCO2:2,PO2:2,HCO3:2 in the last 72 hours  Studies/Results: No results found.  Anti-infectives: Anti-infectives     Start     Dose/Rate Route Frequency Ordered Stop   06/03/11 2115   ciprofloxacin (CIPRO) IVPB 400 mg  Status:  Discontinued     Comments: Please mix with NS.      400 mg 200 mL/hr over 60 Minutes Intravenous Every 12 hours 06/03/11 2114 06/03/11 2121   05/30/11 1130   ciprofloxacin (CIPRO) IVPB 400 mg  Status:  Discontinued        400 mg 200 mL/hr over 60 Minutes Intravenous 2 times daily 05/30/11 1038 06/02/11 1618   05/29/11 2100   imipenem-cilastatin (PRIMAXIN) 500 mg in sodium chloride 0.9 % 100 mL IVPB        500 mg 200 mL/hr over 30 Minutes Intravenous Every 8 hours 05/29/11 2007     05/29/11 2100   vancomycin (VANCOCIN) 1,250 mg in sodium chloride 0.9 % 250 mL IVPB  Status:  Discontinued        1,250 mg 166.7 mL/hr over 90 Minutes Intravenous Every 24 hours  05/29/11 2007 06/03/11 1006   05/07/11 1400   imipenem-cilastatin (PRIMAXIN) 500 mg in sodium chloride 0.9 % 100 mL IVPB        500 mg 200 mL/hr over 30 Minutes Intravenous 3 times per day 05/07/11 1106 05/21/11 1359   05/07/11 1200   micafungin (MYCAMINE) 100 mg in sodium chloride 0.9 % 100 mL IVPB  Status:  Discontinued     Comments: PER PHARMACY      100 mg 100 mL/hr over 1 Hours Intravenous Every 24 hours 05/07/11 1032 05/19/11 1103   05/07/11 1200   vancomycin (VANCOCIN) 1,250 mg in sodium chloride 0.9 % 250 mL IVPB  Status:  Discontinued        1,250 mg 166.7 mL/hr over 90 Minutes Intravenous Every 24 hours 05/07/11 1106 05/19/11 1103   05/07/11 1000   micafungin (MYCAMINE) 150 mg in sodium chloride 0.9 % 100 mL IVPB  Status:  Discontinued     Comments: PER PHARMACY      150 mg 100 mL/hr over 1 Hours Intravenous Daily 05/07/11 0951 05/07/11 1029   05/07/11 0300   ertapenem (INVANZ) 1 g in sodium chloride 0.9 % 50 mL IVPB        1 g 100  mL/hr over 30 Minutes Intravenous  Once 05/07/11 0300 05/07/11 0348   05/07/11 0130   cefTRIAXone (ROCEPHIN) 1 g in dextrose 5 % 50 mL IVPB        1 g 100 mL/hr over 30 Minutes Intravenous  Once 05/07/11 0122 05/07/11 0200          Assessment/Plan: s/p Procedure(s): EXPLORATORY LAPAROTOMY GASTROSTOMY GASTRECTOMY CHOLECYSTECTOMY Advance diet For CT later this week to eval drains  LOS: 48 days    TOTH III,PAUL S 06/23/2011

## 2011-06-23 NOTE — Progress Notes (Signed)
Patient ID: [name], female   DOB: 11-Mar-1932, 77 y.o.   MRN: [id number]  Interim Summary  Active Diagnosis 1. Septic shock -resolved 2. S/p VDRF 3. Pneumoperitoneum due to perforated peptic ulcer 4. S/p Ex-Lap with Lysis of adhesions, Antrectomy with duodenal resection, Billroth 2 reconstruction, omental patching of Duodenal bulb, Open Cholecystectomy, Gastrostomy and Jejunostomy on 11/08 5. Epigastric abscess s/p percutaneous drain per IR 12/02 6. Severe Protein calorie malnutrition 7. Diabetes Mellitus 8. Dementia 9. 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 Hypertension 12. Depression  Consultants  1. CCS 2. PCCM -signed off 3. IR  Procedures 1. 11/08 Emergent  Ex-Lap with Lysis of adhesions, Antrectomy with duodenal resection, Billroth 2 reconstruction, omental patching of Duodenal bulb, Open Cholecystectomy, Gastrostomy and Jejunostomy on 11/08 by Dr.Gross 2. 12/02 CT guided percutaneous drain of epigastric abscess per Dr.Schick- IR  Brief Course: [name] is a 79/f admitted to ICU with Septic shock, respiratory failure and acute pneumoperitoneum / perforated peptic ulcer , subsequently underwent emergent Surgery by Dr.Gross on 11/08 as noted above.  She had a prolong course of acute respiratory failure and was extubated on 11/18, she had been treated with a 14day course of meropenem till 11/22. Then her course was complicated by development of an intra-abdominal abscess, Meropenem was added back to her regimen on 11/30. Subsequently she had a percutaneous drain placed by IR on 12/02. D/W ID Dr Orvan Falconer on 12-09 regarding duration of Meropenem - he recommended to continue Primaxin for 3 weeks from 05-31-11 and then based on the clinical picture. Her last CT 1217/ has shown no significant change in small abscess, but she has done well from a clinical standpoint, with her diet being advanced per CCS, and plans for repeat CT this week, depending on this CT decision  needs to be made regarding stopping Meropenem, completed 3 weeks of Additional Meropenem per ID recs on 12/24. From a nutrition standpoint she has been on TPN and now is tolerating full liquid diet well. She will need SNF at discharge    Subjective: No new complaints Abdominal pain is controlled  Objective:  Vital signs in last 24 hours:  Filed Vitals:   06/22/11 2215 06/23/11 0500 06/23/11 0537 06/23/11 1405  BP: 132/75  155/84 173/83  Pulse: 90  97 90  Temp: 99.2 F (37.3 C)  97.4 F (36.3 C) 100.2 F (37.9 C)  TempSrc: Axillary  Oral Oral  Resp: 20  20 20   Height:      Weight:  79 kg (174 lb 2.6 oz)    SpO2: 98%  99% 99%    Intake/Output from previous day:   Intake/Output Summary (Last 24 hours) at 06/23/11 1618 Last data filed at 06/23/11 1300  Gross per 24 hour  Intake   1575 ml  Output   2425 ml  Net   -850 ml    Physical Exam: General: Alert, awake, in no acute distress. HEENT: No bruits, no goiter. Moist mucous membranes, no scleral icterus, no conjunctival pallor. Heart: Regular rate and rhythm, S1/S2 +, no murmurs, rubs, gallops. Lungs: Clear to auscultation bilaterally. No wheezing, no rhonchi, no rales.  Abdomen: Soft, decreased tenderness especially in lower quadrants, no rebound, positive bowel sounds, drains noted Extremities: No clubbing or cyanosis, no pitting edema,  positive pedal pulses. Neuro: No focal neurologic deficits  Lab Results:  Basic Metabolic Panel:    Component Value Date/Time   NA 129* 06/22/2011 0921   K 4.2 06/22/2011 0921   CL  97 06/22/2011 0921   CO2 23 06/22/2011 0921   BUN 14 06/22/2011 0921   CREATININE 0.44* 06/22/2011 0921   GLUCOSE 152* 06/22/2011 0921   CALCIUM 8.9 06/22/2011 0921   CBC:    Component Value Date/Time   WBC 5.8 06/23/2011 0440   HGB 9.9* 06/23/2011 0440   HCT 30.7* 06/23/2011 0440   PLT 375 06/23/2011 0440   MCV 85.5 06/23/2011 0440   NEUTROABS 3.6 06/22/2011 0921   LYMPHSABS 1.9  06/22/2011 0921   MONOABS 0.6 06/22/2011 0921   EOSABS 0.2 06/22/2011 0921   BASOSABS 0.0 06/22/2011 0921      Lab 06/23/11 0440 06/22/11 0921 06/21/11 0500 06/20/11 0500 06/19/11 0600  WBC 5.8 6.2 5.1 4.4 5.1  HGB 9.9* 10.3* 9.5* 9.4* 9.7*  HCT 30.7* 32.2* 29.9* 29.4* 29.8*  PLT 375 352 359 351 341  MCV 85.5 85.6 85.2 85.5 86.1  MCH 27.6 27.4 27.1 27.3 28.0  MCHC 32.2 32.0 31.8 32.0 32.6  RDW 16.9* 16.9* 16.9* 17.1* 17.3*  LYMPHSABS -- 1.9 -- -- --  MONOABS -- 0.6 -- -- --  EOSABS -- 0.2 -- -- --  BASOSABS -- 0.0 -- -- --  BANDABS -- -- -- -- --    Lab 06/22/11 0921 06/21/11 0500 06/20/11 0500 06/19/11 0600 06/18/11 0550  NA 129* 132* 131* 131* 130*  K 4.2 3.4* 3.4* 3.7 3.6  CL 97 98 99 98 98  CO2 23 25 26 25 24   GLUCOSE 152* 132* 132* 135* 117*  BUN 14 12 11 13 12   CREATININE 0.44* 0.40* 0.43* 0.45* 0.44*  CALCIUM 8.9 8.7 8.6 8.9 8.6  MG 1.5 -- -- 1.5 --   No results found for this basename: INR:5,PROTIME:5 in the last 168 hours Cardiac markers:  No results found for this basename: CK:3,CKMB:3,TROPONINI:3,MYOGLOBIN:3 in the last 168 hours No components found with this basename: POCBNP:3 No results found for this or any previous visit (from the past 240 hour(s)).  Studies/Results: No results found.  Medications: Scheduled Meds:    . cloNIDine  0.1 mg Transdermal Weekly  . feeding supplement  237 mL Oral TID WC  . feeding supplement  1 Container Oral TID WC  . imipenem-cilastatin  500 mg Intravenous Q8H  . lip balm  1 application Topical BID  . metoprolol tartrate  12.5 mg Oral BID  . pantoprazole  40 mg Oral BID AC   Continuous Infusions:    . sodium chloride 20 mL/hr at 06/23/11 0910  . fat emulsion 240 mL (06/22/11 1806)  . TPN (CLINIMIX) +/- additives 80 mL/hr at 06/21/11 1727  . TPN (CLINIMIX) +/- additives    . TPN (CLINIMIX) +/- additives 80 mL/hr at 06/22/11 1806  . DISCONTD: sodium chloride 50 mL/hr at 06/23/11 0436   PRN Meds:.acetaminophen,  hydrALAZINE, LORazepam, morphine injection, ondansetron (ZOFRAN) IV, oxyCODONE, phenol, sodium chloride  Assessment/Plan: 1. Intra-abdominal abscess Perc drain per IR 12/02 Unchanged based on last CT, on Day #24 of Meropenem Clinically improving, diet being advanced, Management per CCS/IR ? Repeat CT this week per CCS, depending on that Meropenem could be stopped since already completed 3 weeks since drain per ID recs  2. Perforated peptic ulcer S/p Ex-Lap with Lysis of adhesions, Antrectomy with duodenal resection, Billroth 2 reconstruction, omental patching of Duodenal bulb, Open Cholecystectomy, Gastrostomy and Jejunostomy on 11/08   3. Esophagitis, erosive - continue protonix  4. DM:  Stable on SSI  5. Malnutrition on TNA, full liquid diet  6. Hyponatremia: chronic,  stable  7. Deconditioning/ Disposition: needs SNF  Advance directives - Full code      LOS: 48 days   Wednesday Ericsson 06/23/2011, 4:18 PM  [facility] Pager: [phone]

## 2011-06-23 NOTE — Progress Notes (Signed)
PARENTERAL NUTRITION CONSULT NOTE - FOLLOW UP  Pharmacy Consult for TPN Indication: Not tolerating TF  Allergies  Allergen Reactions  . Other Swelling    Pinto beans cause swelling and hives  . Penicillins Swelling    Patient Measurements: Height: 5\' 6"  (167.6 cm) Weight: 174 lb 2.6 oz (79 kg) IBW/kg (Calculated) : 59.3    Vital Signs: Temp: 97.4 F (36.3 C) (12/25 0537) Temp src: Oral (12/25 0537) BP: 155/84 mmHg (12/25 0537) Pulse Rate: 97  (12/25 0537) Intake/Output from previous day: 12/24 0701 - 12/25 0700 In: 1435 [P.O.:1435] Out: 2550 [Urine:2525; Drains:25] Intake/Output from this shift:    Labs:  [facility] 06/23/11 0440 06/22/11 0921 06/21/11 0500  WBC 5.8 6.2 5.1  HGB 9.9* 10.3* 9.5*  HCT 30.7* 32.2* 29.9*  PLT 375 352 359  APTT -- -- --  INR -- -- --     Basename 06/22/11 0921 06/21/11 0500  NA 129* 132*  K 4.2 3.4*  CL 97 98  CO2 23 25  GLUCOSE 152* 132*  BUN 14 12  CREATININE 0.44* 0.40*  LABCREA -- --  CREAT24HRUR -- --  CALCIUM 8.9 8.7  MG 1.5 --  PHOS 4.9* --  PROT 6.6 --  ALBUMIN 2.0* --  AST 24 --  ALT 23 --  ALKPHOS 94 --  BILITOT 0.2* --  BILIDIR -- --  IBILI -- --  PREALBUMIN 15.0* --  TRIG 121 --  CHOLHDL -- --  CHOL 125 --   Estimated Creatinine Clearance: 62.5 ml/min (by C-G formula based on Cr of 0.44).    Basename 06/23/11 0353 06/23/11 0018 06/22/11 2003  GLUCAP 132* 136* 120*    Medications:  Scheduled:     . cloNIDine  0.1 mg Transdermal Weekly  . feeding supplement  237 mL Oral TID WC  . feeding supplement  1 Container Oral TID WC  . imipenem-cilastatin  500 mg Intravenous Q8H  . lip balm  1 application Topical BID  . metoprolol tartrate  12.5 mg Oral BID  . pantoprazole  40 mg Oral BID AC  . DISCONTD: metoprolol  5 mg Intravenous Q12H    Insulin Requirements in the past 24 hours:  50 units regular insulin in TPN SSI d/c d/t good CBG control  Current Nutrition:  Clear liquid diet TPN with  Clinimix E 5/15 @ 80cc/hr, Lipids MWF- ~1600Kcal and 96 g protein daily  Assessment: 77 y/o previously on TPN (for erosive esophagitis with perforated pyloric ulcer s/p lap). S/p gastrostomy, gastrectomy and cholecystectomy 11/7.  TPN initially started 05/12/11 - 05/14/2011. 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 TPN ongoing d/t poor PO intake, changed to continuous 24h infusion d/t low Na. Diet is clear liquids currently.   Noted new PICC being placed this AM.  GI Ongoing abd pain, small PO intake of clears. Noted re-CT abd without new abscesses, old fluid collections unchanged in size.   Endo CBG controlled 120-132, on insulin in TPN. No SSI coverage, would continue CBG checks d/t insulin in TPN (50 units/bag)  Lytes/Renal No new labs today. Cannot correct individual lytes in TPN d/t premade bags.  Hepatobil Trig and LFTs WNL. Prealbumin trending up nicely-still low for goal 18-40, but anticipate will continue to improve as PO intake improves.  Pulm RA  Cardiology VSS  ID Afeb, WBC WNL. Primaxin#26 cont- all cx neg except Ecoli and PSA UTI 12/1- ? D/c Primaxin or chg to Cipro (PSA was sens), continued therapy does  not seem warranted- WBC WNL, pt afebrile. Abscesses unchanged- noted plans to re-CT later this week.  Px SQ hep, IV PPI    Nutritional Goals:  1785-2000 kCal, 95-110 grams of protein per day   Plan: 1. Continue Clinimix E 5/15 at 80 cc/hr *MVI/Trace/IV fats MWF d/t national shortage* 2. MD please address abx duration. 3. Will f/up AM labs  Muhsin Doris K. Allena Katz, PharmD, BCPS.  Clinical Pharmacist Pager [phone]. 06/23/2011 8:27 AM

## 2011-06-24 LAB — BASIC METABOLIC PANEL
CO2: 27 mEq/L (ref 19–32)
Calcium: 9.2 mg/dL (ref 8.4–10.5)
Creatinine, Ser: 0.46 mg/dL — ABNORMAL LOW (ref 0.50–1.10)
Glucose, Bld: 129 mg/dL — ABNORMAL HIGH (ref 70–99)

## 2011-06-24 LAB — GLUCOSE, CAPILLARY
Glucose-Capillary: 115 mg/dL — ABNORMAL HIGH (ref 70–99)
Glucose-Capillary: 145 mg/dL — ABNORMAL HIGH (ref 70–99)
Glucose-Capillary: 175 mg/dL — ABNORMAL HIGH (ref 70–99)
Glucose-Capillary: 98 mg/dL (ref 70–99)

## 2011-06-24 LAB — CBC
MCH: 27.4 pg (ref 26.0–34.0)
MCV: 85.5 fL (ref 78.0–100.0)
Platelets: 324 10*3/uL (ref 150–400)
RBC: 3.58 MIL/uL — ABNORMAL LOW (ref 3.87–5.11)

## 2011-06-24 MED ORDER — FAT EMULSION 20 % IV EMUL
240.0000 mL | INTRAVENOUS | Status: AC
  Administered 2011-06-24: 240 mL via INTRAVENOUS
  Filled 2011-06-24: qty 250

## 2011-06-24 MED ORDER — TRACE MINERALS CR-CU-MN-SE-ZN 10-1000-500-60 MCG/ML IV SOLN
INTRAVENOUS | Status: AC
  Administered 2011-06-24: 18:00:00 via INTRAVENOUS
  Filled 2011-06-24: qty 2000

## 2011-06-24 NOTE — Progress Notes (Signed)
48 Days Post-Op  Subjective: On Full liquids, last CT 12/17. Says she's doing OK with the full liquids. Having stools, denies diarrhea. 20ml recorded from drains. Objective: Vital signs in last 24 hours: Temp:  [98.1 F (36.7 C)-100.2 F (37.9 C)] 98.1 F (36.7 C) (12/26 0500) Pulse Rate:  [84-91] 91  (12/26 1117) Resp:  [20] 20  (12/26 0500) BP: (143-173)/(63-83) 146/83 mmHg (12/26 1117) SpO2:  [98 %-99 %] 99 % (12/26 0500) Weight:  [74.3 kg (163 lb 12.8 oz)] 163 lb 12.8 oz (74.3 kg) (12/25 2115) Last BM Date: 06/23/11  Intake/Output from previous day: 12/25 0701 - 12/26 0700 In: 916.7 [P.O.:200; I.V.:76.7; TPN:640] Out: 2570 [Urine:2550; Drains:20] Intake/Output this shift: Total I/O In: 120 [P.O.:120] Out: -   GI: soft, nontender. tolerating po's  Lab Results:   Basename 06/24/11 0600 06/23/11 0440  WBC 6.6 5.8  HGB 9.8* 9.9*  HCT 30.6* 30.7*  PLT 324 375   BMET  Basename 06/24/11 0600 06/22/11 0921  NA 132* 129*  K 3.9 4.2  CL 97 97  CO2 27 23  GLUCOSE 129* 152*  BUN 14 14  CREATININE 0.46* 0.44*  CALCIUM 9.2 8.9   PT/INR No results found for this basename: LABPROT:2,INR:2 in the last 72 hours ABG No results found for this basename: PHART:2,PCO2:2,PO2:2,HCO3:2 in the last 72 hours  Studies/Results: No results found.  Anti-infectives: Anti-infectives     Start     Dose/Rate Route Frequency Ordered Stop   06/03/11 2115   ciprofloxacin (CIPRO) IVPB 400 mg  Status:  Discontinued     Comments: Please mix with NS.      400 mg 200 mL/hr over 60 Minutes Intravenous Every 12 hours 06/03/11 2114 06/03/11 2121   05/30/11 1130   ciprofloxacin (CIPRO) IVPB 400 mg  Status:  Discontinued        400 mg 200 mL/hr over 60 Minutes Intravenous 2 times daily 05/30/11 1038 06/02/11 1618   05/29/11 2100   imipenem-cilastatin (PRIMAXIN) 500 mg in sodium chloride 0.9 % 100 mL IVPB        500 mg 200 mL/hr over 30 Minutes Intravenous Every 8 hours 05/29/11 2007     05/29/11 2100   vancomycin (VANCOCIN) 1,250 mg in sodium chloride 0.9 % 250 mL IVPB  Status:  Discontinued        1,250 mg 166.7 mL/hr over 90 Minutes Intravenous Every 24 hours 05/29/11 2007 06/03/11 1006   05/07/11 1400   imipenem-cilastatin (PRIMAXIN) 500 mg in sodium chloride 0.9 % 100 mL IVPB        500 mg 200 mL/hr over 30 Minutes Intravenous 3 times per day 05/07/11 1106 05/21/11 1359   05/07/11 1200   micafungin (MYCAMINE) 100 mg in sodium chloride 0.9 % 100 mL IVPB  Status:  Discontinued     Comments: PER PHARMACY      100 mg 100 mL/hr over 1 Hours Intravenous Every 24 hours 05/07/11 1032 05/19/11 1103   05/07/11 1200   vancomycin (VANCOCIN) 1,250 mg in sodium chloride 0.9 % 250 mL IVPB  Status:  Discontinued        1,250 mg 166.7 mL/hr over 90 Minutes Intravenous Every 24 hours 05/07/11 1106 05/19/11 1103   05/07/11 1000   micafungin (MYCAMINE) 150 mg in sodium chloride 0.9 % 100 mL IVPB  Status:  Discontinued     Comments: PER PHARMACY      150 mg 100 mL/hr over 1 Hours Intravenous Daily 05/07/11 0951 05/07/11 1029  05/07/11 0300   ertapenem (INVANZ) 1 g in sodium chloride 0.9 % 50 mL IVPB        1 g 100 mL/hr over 30 Minutes Intravenous  Once 05/07/11 0300 05/07/11 0348   05/07/11 0130   cefTRIAXone (ROCEPHIN) 1 g in dextrose 5 % 50 mL IVPB        1 g 100 mL/hr over 30 Minutes Intravenous  Once 05/07/11 0122 05/07/11 0200          Assessment/Plan: s/p Procedure(s): EXPLORATORY LAPAROTOMY GASTROSTOMY GASTRECTOMY CHOLECYSTECTOMY Advance diet For CT later this week to eval drains Will discuss with DR. Wyatt.  LOS: 49 days    [name] 06/24/2011

## 2011-06-24 NOTE — Progress Notes (Signed)
Subjective:  Patient seen and examined, denies any complaints.  Objective: Vital signs in last 24 hours: Temp:  [98.1 F (36.7 C)-98.2 F (36.8 C)] 98.1 F (36.7 C) (12/26 0500) Pulse Rate:  [84-91] 91  (12/26 1117) Resp:  [20] 20  (12/26 0500) BP: (143-146)/(63-83) 146/83 mmHg (12/26 1117) SpO2:  [98 %-99 %] 99 % (12/26 0500) Weight:  [74.3 kg (163 lb 12.8 oz)] 163 lb 12.8 oz (74.3 kg) (12/25 2115) Weight change: -4.7 kg (-10 lb 5.8 oz) Last BM Date: 06/23/11  Intake/Output from previous day: 12/25 0701 - 12/26 0700 In: 916.7 [P.O.:200; I.V.:76.7; TPN:640] Out: 2570 [Urine:2550; Drains:20] Total I/O In: 120 [P.O.:120] Out: -    Physical Exam: General: Alert, awake, in no acute distress. Heart: Regular rate and rhythm, without murmurs Lungs: Clear to auscultation bilaterally. Abdomen: Soft, mildly tender, nondistended, positive bowel sounds. Dressing and drains in place. Extremities: No clubbing cyanosis or edema with positive pedal pulses. Neuro: Grossly intact, nonfocal.    Lab Results: Results for orders placed during the hospital encounter of 05/06/11 (from the past 24 hour(s))  GLUCOSE, CAPILLARY     Status: Abnormal   Collection Time   06/23/11  5:33 PM      Component Value Range   Glucose-Capillary 139 (*) 70 - 99 (mg/dL)  GLUCOSE, CAPILLARY     Status: Abnormal   Collection Time   06/23/11  7:57 PM      Component Value Range   Glucose-Capillary 162 (*) 70 - 99 (mg/dL)   Comment 1 Notify RN     Comment 2 Documented in Chart    GLUCOSE, CAPILLARY     Status: Abnormal   Collection Time   06/24/11 12:20 AM      Component Value Range   Glucose-Capillary 115 (*) 70 - 99 (mg/dL)   Comment 1 Documented in Chart     Comment 2 Notify RN    GLUCOSE, CAPILLARY     Status: Abnormal   Collection Time   06/24/11  4:06 AM      Component Value Range   Glucose-Capillary 145 (*) 70 - 99 (mg/dL)   Comment 1 Notify RN     Comment 2 Documented in Chart    CBC      Status: Abnormal   Collection Time   06/24/11  6:00 AM      Component Value Range   WBC 6.6  4.0 - 10.5 (K/uL)   RBC 3.58 (*) 3.87 - 5.11 (MIL/uL)   Hemoglobin 9.8 (*) 12.0 - 15.0 (g/dL)   HCT 40.9 (*) 81.1 - 46.0 (%)   MCV 85.5  78.0 - 100.0 (fL)   MCH 27.4  26.0 - 34.0 (pg)   MCHC 32.0  30.0 - 36.0 (g/dL)   RDW 91.4 (*) 78.2 - 15.5 (%)   Platelets 324  150 - 400 (K/uL)  BASIC METABOLIC PANEL     Status: Abnormal   Collection Time   06/24/11  6:00 AM      Component Value Range   Sodium 132 (*) 135 - 145 (mEq/L)   Potassium 3.9  3.5 - 5.1 (mEq/L)   Chloride 97  96 - 112 (mEq/L)   CO2 27  19 - 32 (mEq/L)   Glucose, Bld 129 (*) 70 - 99 (mg/dL)   BUN 14  6 - 23 (mg/dL)   Creatinine, Ser 9.56 (*) 0.50 - 1.10 (mg/dL)   Calcium 9.2  8.4 - 21.3 (mg/dL)   GFR calc non Af Amer >90  >  90 (mL/min)   GFR calc Af Amer >90  >90 (mL/min)  GLUCOSE, CAPILLARY     Status: Abnormal   Collection Time   06/24/11  7:56 AM      Component Value Range   Glucose-Capillary 139 (*) 70 - 99 (mg/dL)   Comment 1 Documented in Chart     Comment 2 Notify RN    GLUCOSE, CAPILLARY     Status: Abnormal   Collection Time   06/24/11 12:30 PM      Component Value Range   Glucose-Capillary 135 (*) 70 - 99 (mg/dL)   Comment 1 Notify RN      Studies/Results: No results found.  Medications:    . cloNIDine  0.1 mg Transdermal Weekly  . feeding supplement  237 mL Oral TID WC  . feeding supplement  1 Container Oral TID WC  . imipenem-cilastatin  500 mg Intravenous Q8H  . lip balm  1 application Topical BID  . metoprolol tartrate  12.5 mg Oral BID  . pantoprazole  40 mg Oral BID AC    acetaminophen, hydrALAZINE, LORazepam, morphine injection, ondansetron (ZOFRAN) IV, oxyCODONE, phenol, sodium chloride     . sodium chloride 20 mL/hr at 06/23/11 0910  . fat emulsion 240 mL (06/22/11 1806)  . fat emulsion    . TPN (CLINIMIX) +/- additives 80 mL/hr at 06/23/11 1754  . TPN (CLINIMIX) +/- additives 80  mL/hr at 06/22/11 1806  . TPN (CLINIMIX) +/- additives      Assessment/Plan:  1. Intra-abdominal abscess  Perc drain per IR 12/02  Unchanged based on last CT, on Day #25of Meropenem (ID recommended 3 weeks of antibiotics) Clinically improving,tolerating full liquid diet For  repeat CT this week per CCS 2. Perforated peptic ulcer  S/p Ex-Lap with Lysis of adhesions, Antrectomy with duodenal resect. Billroth 2 reconstruction, omental patching of Duodenal bulb, Open Cholecystectomy, Gastrostomy and Jejunostomy on 11/08  3. Esophagitis, erosive  - continue protonix  4. DM: Stable on SSI  5. Malnutrition  on TNA, full liquid diet  6. Hyponatremia: chronic, stable  7. Deconditioning/ Disposition: needs SNF      LOS: 49 days   Swayzie Choate 06/24/2011, 2:37 PM

## 2011-06-24 NOTE — Progress Notes (Signed)
Nutrition Follow-up Patient is receiving TPN with Clinimix E 5/15 @ 80 ml/hr. Lipids (20% IVFE @ 10 ml/hr), multivitamins, and trace elements are provided 3 times weekly (MWF) due to national backorder. Provides 1,568 kcal and 96 grams protein daily (based on weekly average). Meets 88% minimum estimated kcal and 100% minimum estimated protein needs.  Diet Order:  Full liquids   Meds: Scheduled Meds:   . cloNIDine  0.1 mg Transdermal Weekly  . feeding supplement  237 mL Oral TID WC  . feeding supplement  1 Container Oral TID WC  . imipenem-cilastatin  500 mg Intravenous Q8H  . lip balm  1 application Topical BID  . metoprolol tartrate  12.5 mg Oral BID  . pantoprazole  40 mg Oral BID AC   Continuous Infusions:   . sodium chloride 20 mL/hr at 06/23/11 0910  . fat emulsion 240 mL (06/22/11 1806)  . fat emulsion    . TPN (CLINIMIX) +/- additives 80 mL/hr at 06/23/11 1754  . TPN (CLINIMIX) +/- additives 80 mL/hr at 06/22/11 1806  . TPN (CLINIMIX) +/- additives     PRN Meds:.acetaminophen, hydrALAZINE, LORazepam, morphine injection, ondansetron (ZOFRAN) IV, oxyCODONE, phenol, sodium chloride  Labs:  CMP     Component Value Date/Time   NA 132* 06/24/2011 0600   K 3.9 06/24/2011 0600   CL 97 06/24/2011 0600   CO2 27 06/24/2011 0600   GLUCOSE 129* 06/24/2011 0600   BUN 14 06/24/2011 0600   CREATININE 0.46* 06/24/2011 0600   CALCIUM 9.2 06/24/2011 0600   PROT 6.6 06/22/2011 0921   ALBUMIN 2.0* 06/22/2011 0921   AST 24 06/22/2011 0921   ALT 23 06/22/2011 0921   ALKPHOS 94 06/22/2011 0921   BILITOT 0.2* 06/22/2011 0921   GFRNONAA >90 06/24/2011 0600   GFRAA >90 06/24/2011 0600     Intake/Output Summary (Last 24 hours) at 06/24/11 1213 Last data filed at 06/24/11 7829  Gross per 24 hour  Intake 896.67 ml  Output   2570 ml  Net -1673.33 ml   Diet has been advance to full liquids, 2-3 nutrition supplements (ensure and resource breeze) being consumed daily per patient report.  With TPN and PO intake, patient is likely meeting estimated kcal needs.   Weight Status:  74.3 kg, trending down likely still related to negative fluid balance.   Nutrition Dx:  Inadequate oral intake, ongoing  Goal:  TPN to meet 100% of estimated needs, currently being unmet.   Intervention:  Recommend as PO intake continues to improve and diet is advanced, TPN weaned per pharmacy.   Monitor:  PO intake, weights, labs, TPN rates   Rudean Haskell Pager #:  [phone]

## 2011-06-24 NOTE — Progress Notes (Signed)
Patient denies diarrhea.  Pulled out her PICC line.  Will replace for now.  May wean off TNA soon.  [name]. Gae Bon, MD, FACS [phone] [facility] Surgery

## 2011-06-24 NOTE — Progress Notes (Signed)
PARENTERAL NUTRITION CONSULT NOTE - FOLLOW UP  Pharmacy Consult for TPN Indication: Not tolerating TF  Allergies  Allergen Reactions  . Other Swelling    Pinto beans cause swelling and hives  . Penicillins Swelling    Patient Measurements: Height: 5\' 6"  (167.6 cm) Weight: 163 lb 12.8 oz (74.3 kg) (1 pad,, 1pillow, 1 sheet, 1 blanket, & no machines ) IBW/kg (Calculated) : 59.3   Vital Signs: Temp: 98.1 F (36.7 C) (12/26 0500) Temp src: Oral (12/26 0500) BP: 144/83 mmHg (12/26 0500) Pulse Rate: 84  (12/26 0500)  Intake/Output from previous day: 12/25 0701 - 12/26 0700 In: 916.7 [P.O.:200; I.V.:76.7; TPN:640] Out: 2570 [Urine:2550; Drains:20]  Labs:  [facility] 06/24/11 0600 06/23/11 0440 06/22/11 0921  WBC 6.6 5.8 6.2  HGB 9.8* 9.9* 10.3*  HCT 30.6* 30.7* 32.2*  PLT 324 375 352  APTT -- -- --  INR -- -- --     Basename 06/24/11 0600 06/22/11 0921  NA 132* 129*  K 3.9 4.2  CL 97 97  CO2 27 23  GLUCOSE 129* 152*  BUN 14 14  CREATININE 0.46* 0.44*  LABCREA -- --  CREAT24HRUR -- --  CALCIUM 9.2 8.9  MG -- 1.5  PHOS -- 4.9*  PROT -- 6.6  ALBUMIN -- 2.0*  AST -- 24  ALT -- 23  ALKPHOS -- 94  BILITOT -- 0.2*  BILIDIR -- --  IBILI -- --  PREALBUMIN -- 15.0*  TRIG -- 121  CHOLHDL -- --  CHOL -- 125   Estimated Creatinine Clearance: 60.7 ml/min (by C-G formula based on Cr of 0.46).    Basename 06/24/11 0756 06/24/11 0406 06/24/11 0020  GLUCAP 139* 145* 115*   Insulin Requirements in the past 24 hours:  50 units regular insulin in TPN CBG's 115-162 (most of them < 150)  Current Nutrition:  Clear liquid diet TPN with Clinimix E 5/15 @ 80cc/hr, Lipids MWF- ~1600Kcal and 96 g protein daily  Assessment: 77 y/o previously on TPN (for erosive esophagitis with perforated pyloric ulcer s/p lap). S/p gastrostomy, gastrectomy and cholecystectomy 11/7.  TPN initially started 05/12/11 - 05/14/2011. 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 TPN ongoing d/t poor PO intake, changed to continuous 24h infusion d/t low Na. Diet is clear liquids currently.   Pt. with PICC line for IV TNA access (placed 12/25)  GI Ongoing abd pain, small PO intake of clears. Noted re-CT abd without new abscesses, old fluid collections unchanged in size.   Endo CBG controlled 115-162, on insulin in TPN. No SSI coverage, would continue CBG checks d/t insulin in TPN (50 units/bag)  Lytes/Renal Hyponatremia - resolving - Sodium = 132 today  Hepatobil Trig and LFTs WNL. Prealbumin trending up nicely-still low for goal 18-40, but anticipate will continue to improve as PO intake improves.  Pulm RA  Cardiology VSS  ID Afeb, WBC WNL. Primaxin # 27 (completed suggested 3 week course per ID on 12/24).  Consider stopping and observing.  All cx neg except Ecoli and PSA UTI  D/c Primaxin or chg to Cipro (PSA was sens), continued therapy does not seem warranted- WBC WNL, pt afebrile. Abscesses unchanged- noted plans to re-CT later this week.  Px SQ hep, IV PPI    Nutritional Goals:  1785-2000 kCal, 95-110 grams of protein per day   Plan: 1. Continue Clinimix E 5/15 at 80 cc/hr *MVI/Trace/IV fats MWF d/t national shortage* 2. Will f/u repeat CT and plans for  ongoing TNA nutrition 3. Will f/up AM labs  Nadara Mustard, PharmD., MS  Clinical Pharmacist Pager [phone]. 06/24/2011 8:40 AM

## 2011-06-24 NOTE — Progress Notes (Signed)
Physical Therapy Treatment Patient Details Name: MALAYA CAGLEY MRN: [id number] DOB: 02/13/1932 Today's Date: 06/24/2011  PT Assessment/Plan  PT - Assessment/Plan Comments on Treatment Session: Pt continues to require significant assistance with transfer mobility and is limited by pain and decreased motivation. Pt however contributed better today with frequent reminders that improved mobility will lead to faster discharge out of hospital.  PT Plan: Discharge plan remains appropriate PT Frequency: Min 3X/week Follow Up Recommendations: Skilled nursing facility Equipment Recommended: Defer to next venue PT Goals  Acute Rehab PT Goals PT Goal Formulation: With patient Time For Goal Achievement: 2 weeks Pt will go Supine/Side to Sit: with min assist PT Goal: Supine/Side to Sit - Progress: Progressing toward goal Pt will go Sit to Supine/Side: with min assist PT Goal: Sit to Supine/Side - Progress: Progressing toward goal Pt will Transfer Bed to Chair/Chair to Bed: with mod assist PT Transfer Goal: Bed to Chair/Chair to Bed - Progress: Not met Pt will Ambulate: 1 - 15 feet;with mod assist;with rolling walker PT Goal: Ambulate - Progress: Not met  PT Treatment Precautions/Restrictions  Precautions Precautions: Fall Precaution Comments: Weakness. Pt also with 2 JP drains Restrictions Weight Bearing Restrictions: No Mobility (including Balance) Bed Mobility Bed Mobility: Yes Rolling Left: 4: Min assist;With rail Rolling Left Details (indicate cue type and reason): Verbal/tactile cues for sequence, efficiency of movement, use of rail.  Left Sidelying to Sit: 3: Mod assist (max verbal cues to initiate) Left Sidelying to Sit Details (indicate cue type and reason): Verbal cues for contribution, placement of UEs.Tactile cues for Bil. LEs over EOB.  Sit to Supine - Right:  (Pt. initiated lifting legs) Transfers Transfers: Yes Sit to Stand: 1: +2 Total assist;Patient percentage  (comment);From bed (pt= 30% (1st) pt = 40% (2nd attempt)) Sit to Stand Details (indicate cue type and reason): Verbal cues for UE placement, bodymechanics, and contribution. Pt contributed more with education on increased mobility will assist in quicker discharge. Performed 2 x.  Stand to Sit: 1: +2 Total assist;Patient percentage (comment);To chair/3-in-1;To bed (pt= 30%) Stand to Sit Details: Verbal cues to assist with control of descent Squat Pivot Transfers: 1: +2 Total assist;Patient percentage (comment) (pt = 20%) Squat Pivot Transfer Details (indicate cue type and reason): Verbal cues for pivoting bil. LEs. Pt attempting to sit prior to being square with chair and being fearful during transfer. Pt at some points pushing against PT when trying to align pt with chair as to not sit on armrest.  Ambulation/Gait Ambulation/Gait: No    Exercise  General Exercises - Lower Extremity Ankle Circles/Pumps: AROM;Supine;Both (into pillow) Long Arc Quad: Seated;Both;AROM;15 reps Hip Flexion/Marching:  (pt refused) End of Session PT - End of Session Equipment Utilized During Treatment: Gait belt Activity Tolerance: Patient limited by pain;Patient limited by fatigue Patient left: in chair;with call bell in reach Nurse Communication: Mobility status for transfers General Behavior During Session: [facility] for tasks performed Cognition: Impaired (Pt having difficulty focusing on task. Easily distracted) Cognitive Impairment: Decreased attention, decreased ability to understand task at hand, slow to process.   [name] 06/24/2011, 12:33 PM  [name]) [name] PT, DPT Acute Rehabilitation [phone]

## 2011-06-25 LAB — CBC
HCT: 30 % — ABNORMAL LOW (ref 36.0–46.0)
Hemoglobin: 9.6 g/dL — ABNORMAL LOW (ref 12.0–15.0)
MCH: 27.5 pg (ref 26.0–34.0)
MCHC: 32 g/dL (ref 30.0–36.0)
MCV: 86 fL (ref 78.0–100.0)
RDW: 16.7 % — ABNORMAL HIGH (ref 11.5–15.5)

## 2011-06-25 LAB — GLUCOSE, CAPILLARY
Glucose-Capillary: 152 mg/dL — ABNORMAL HIGH (ref 70–99)
Glucose-Capillary: 157 mg/dL — ABNORMAL HIGH (ref 70–99)

## 2011-06-25 LAB — COMPREHENSIVE METABOLIC PANEL
Albumin: 2 g/dL — ABNORMAL LOW (ref 3.5–5.2)
Alkaline Phosphatase: 88 U/L (ref 39–117)
BUN: 15 mg/dL (ref 6–23)
Calcium: 8.7 mg/dL (ref 8.4–10.5)
Creatinine, Ser: 0.44 mg/dL — ABNORMAL LOW (ref 0.50–1.10)
GFR calc Af Amer: >90 mL/min (ref >90)
Glucose, Bld: 184 mg/dL — ABNORMAL HIGH (ref 70–99)
Total Protein: 6.2 g/dL (ref 6.0–8.3)

## 2011-06-25 LAB — MAGNESIUM: Magnesium: 1.6 mg/dL (ref 1.5–2.5)

## 2011-06-25 LAB — PHOSPHORUS: Phosphorus: 4.1 mg/dL (ref 2.3–4.6)

## 2011-06-25 MED ORDER — INSULIN ASPART 100 UNIT/ML ~~LOC~~ SOLN
0.0000 [IU] | SUBCUTANEOUS | Status: DC
  Filled 2011-06-25: qty 3

## 2011-06-25 MED ORDER — INSULIN ASPART 100 UNIT/ML ~~LOC~~ SOLN
0.0000 [IU] | Freq: Four times a day (QID) | SUBCUTANEOUS | Status: DC
  Administered 2011-06-25: 1 [IU] via SUBCUTANEOUS
  Administered 2011-06-25: 2 [IU] via SUBCUTANEOUS
  Administered 2011-06-26: 1 [IU] via SUBCUTANEOUS
  Filled 2011-06-25: qty 3

## 2011-06-25 MED ORDER — INSULIN REGULAR HUMAN 100 UNIT/ML IJ SOLN
INTRAMUSCULAR | Status: AC
  Administered 2011-06-25: 18:00:00 via INTRAVENOUS
  Filled 2011-06-25: qty 2000

## 2011-06-25 NOTE — Progress Notes (Signed)
OT Cancellation Note  Treatment cancelled today due to patient's refusal to participate: Due to abdominal discomfort. Pt provided questioning cues and responds "can you let the RN know for me" Pt did not use the call button to request RN assistance. RN notified. Ot to continue to follow and re-attempt at later date and time.   Harrel Carina Pastos   OTR/L Pager: [phone] Office: [phone] .

## 2011-06-25 NOTE — Progress Notes (Signed)
Subjective:  This is seen and examined, denies any complaints.  Objective: Vital signs in last 24 hours: Temp:  [97.9 F (36.6 C)-99.9 F (37.7 C)] 99.9 F (37.7 C) (12/27 1400) Pulse Rate:  [84-91] 89  (12/27 1400) Resp:  [18-20] 20  (12/27 1400) BP: (134-147)/(71-85) 147/84 mmHg (12/27 1400) SpO2:  [96 %-100 %] 96 % (12/27 1400) Weight:  [73.7 kg (162 lb 7.7 oz)] 162 lb 7.7 oz (73.7 kg) (12/27 0500) Weight change: -0.6 kg (-1 lb 5.2 oz) Last BM Date: 06/24/11  Intake/Output from previous day: 12/26 0701 - 12/27 0700 In: 1220 [P.O.:160; I.V.:140; IV Piggyback:200; TPN:720] Out: 1970.5 [Urine:1950; Drains:20.5] Total I/O In: 240 [P.O.:240] Out: 1106 [Urine:1100; Drains:6]   Physical Exam: General: Alert, awake, in no acute distress.  Heart: Regular rate and rhythm, without murmurs  Lungs: Clear to auscultation bilaterally.  Abdomen: Soft, mildly tender, nondistended, positive bowel sounds. Dressing and drains in place.  Extremities: No clubbing cyanosis or edema with positive pedal pulses.  Neuro: Grossly intact, nonfocal     Lab Results: Results for orders placed during the hospital encounter of 05/06/11 (from the past 24 hour(s))  GLUCOSE, CAPILLARY     Status: Abnormal   Collection Time   06/24/11  8:09 PM      Component Value Range   Glucose-Capillary 175 (*) 70 - 99 (mg/dL)   Comment 1 Notify RN     Comment 2 Documented in Chart    GLUCOSE, CAPILLARY     Status: Abnormal   Collection Time   06/25/11 12:29 AM      Component Value Range   Glucose-Capillary 161 (*) 70 - 99 (mg/dL)  GLUCOSE, CAPILLARY     Status: Abnormal   Collection Time   06/25/11  4:50 AM      Component Value Range   Glucose-Capillary 157 (*) 70 - 99 (mg/dL)   Comment 1 Notify RN     Comment 2 Documented in Chart    CBC     Status: Abnormal   Collection Time   06/25/11  5:40 AM      Component Value Range   WBC 6.9  4.0 - 10.5 (K/uL)   RBC 3.49 (*) 3.87 - 5.11 (MIL/uL)   Hemoglobin  9.6 (*) 12.0 - 15.0 (g/dL)   HCT 16.1 (*) 09.6 - 46.0 (%)   MCV 86.0  78.0 - 100.0 (fL)   MCH 27.5  26.0 - 34.0 (pg)   MCHC 32.0  30.0 - 36.0 (g/dL)   RDW 04.5 (*) 40.9 - 15.5 (%)   Platelets 293  150 - 400 (K/uL)  COMPREHENSIVE METABOLIC PANEL     Status: Abnormal   Collection Time   06/25/11  5:40 AM      Component Value Range   Sodium 129 (*) 135 - 145 (mEq/L)   Potassium 3.9  3.5 - 5.1 (mEq/L)   Chloride 96  96 - 112 (mEq/L)   CO2 26  19 - 32 (mEq/L)   Glucose, Bld 184 (*) 70 - 99 (mg/dL)   BUN 15  6 - 23 (mg/dL)   Creatinine, Ser 8.11 (*) 0.50 - 1.10 (mg/dL)   Calcium 8.7  8.4 - 91.4 (mg/dL)   Total Protein 6.2  6.0 - 8.3 (g/dL)   Albumin 2.0 (*) 3.5 - 5.2 (g/dL)   AST 25  0 - 37 (U/L)   ALT 28  0 - 35 (U/L)   Alkaline Phosphatase 88  39 - 117 (U/L)   Total Bilirubin 0.2 (*)  0.3 - 1.2 (mg/dL)   GFR calc non Af Amer >90  >90 (mL/min)   GFR calc Af Amer >90  >90 (mL/min)  MAGNESIUM     Status: Normal   Collection Time   06/25/11  5:40 AM      Component Value Range   Magnesium 1.6  1.5 - 2.5 (mg/dL)  PHOSPHORUS     Status: Normal   Collection Time   06/25/11  5:40 AM      Component Value Range   Phosphorus 4.1  2.3 - 4.6 (mg/dL)  GLUCOSE, CAPILLARY     Status: Abnormal   Collection Time   06/25/11  8:13 AM      Component Value Range   Glucose-Capillary 170 (*) 70 - 99 (mg/dL)  GLUCOSE, CAPILLARY     Status: Abnormal   Collection Time   06/25/11 12:18 PM      Component Value Range   Glucose-Capillary 152 (*) 70 - 99 (mg/dL)   Comment 1 Notify RN    GLUCOSE, CAPILLARY     Status: Abnormal   Collection Time   06/25/11  4:13 PM      Component Value Range   Glucose-Capillary 157 (*) 70 - 99 (mg/dL)   Comment 1 Documented in Chart     Comment 2 Notify RN      Studies/Results: No results found.  Medications:    . cloNIDine  0.1 mg Transdermal Weekly  . feeding supplement  237 mL Oral TID WC  . feeding supplement  1 Container Oral TID WC  .  imipenem-cilastatin  500 mg Intravenous Q8H  . insulin aspart  0-9 Units Subcutaneous Q6H  . lip balm  1 application Topical BID  . metoprolol tartrate  12.5 mg Oral BID  . pantoprazole  40 mg Oral BID AC  . DISCONTD: insulin aspart  0-9 Units Subcutaneous Q4H    acetaminophen, hydrALAZINE, LORazepam, morphine injection, ondansetron (ZOFRAN) IV, oxyCODONE, phenol, sodium chloride     . sodium chloride 20 mL/hr at 06/25/11 0449  . fat emulsion 240 mL (06/24/11 2200)  . TPN (CLINIMIX) +/- additives 80 mL/hr at 06/23/11 1754  . TPN (CLINIMIX) +/- additives    . TPN (CLINIMIX) +/- additives 80 mL/hr at 06/24/11 2200    Assessment/Plan: 1. Intra-abdominal abscess  Perc drain per IR 12/02  Plans for repeat CT tomorrow as per surgery  on Day #26of Meropenem (ID recommended 3 weeks of antibiotics) , will DC a CT scan result shows improvement otherwise we will  rediscuss with ID Clinically improving, diet is being advanced 2. Perforated peptic ulcer  S/p Ex-Lap with Lysis of adhesions, Antrectomy with duodenal resect. Billroth 2 reconstruction, omental patching of Duodenal bulb, Open Cholecystectomy, Gastrostomy and Jejunostomy on 11/08  3. Esophagitis, erosive  - continue protonix  4. DM: Stable on SSI  5. Malnutrition  on TNA, diet advance as per CCS 6. Hyponatremia: chronic, stable  7. Deconditioning/ Disposition: needs SNF      LOS: 50 days   Korryn Pancoast 06/25/2011, 5:03 PM

## 2011-06-25 NOTE — Progress Notes (Signed)
49 Days Post-Op  Subjective: On Full liquids, last CT 12/17. Says she's doing OK with the full liquids. Having stools, denies diarrhea. 20ml recorded from drains. Objective: Vital signs in last 24 hours: Temp:  [97.9 F (36.6 C)-99.6 F (37.6 C)] 99.6 F (37.6 C) (12/27 0500) Pulse Rate:  [84-98] 84  (12/27 0500) Resp:  [18-20] 18  (12/27 0500) BP: (134-147)/(71-85) 134/85 mmHg (12/27 0500) SpO2:  [96 %-100 %] 100 % (12/27 0500) Weight:  [73.7 kg (162 lb 7.7 oz)] 162 lb 7.7 oz (73.7 kg) (12/27 0500) Last BM Date: 06/24/11  Intake/Output from previous day: 12/26 0701 - 12/27 0700 In: 1220 [P.O.:160; I.V.:140; IV Piggyback:200; TPN:720] Out: 1970.5 [Urine:1950; Drains:20.5] Intake/Output this shift:    GI: soft, nontender. tolerating po's  Lab Results:   Basename 06/25/11 0540 06/24/11 0600  WBC 6.9 6.6  HGB 9.6* 9.8*  HCT 30.0* 30.6*  PLT 293 324   BMET  Basename 06/25/11 0540 06/24/11 0600  NA 129* 132*  K 3.9 3.9  CL 96 97  CO2 26 27  GLUCOSE 184* 129*  BUN 15 14  CREATININE 0.44* 0.46*  CALCIUM 8.7 9.2   PT/INR No results found for this basename: LABPROT:2,INR:2 in the last 72 hours ABG No results found for this basename: PHART:2,PCO2:2,PO2:2,HCO3:2 in the last 72 hours  Studies/Results: No results found.  Anti-infectives: Anti-infectives     Start     Dose/Rate Route Frequency Ordered Stop   06/03/11 2115   ciprofloxacin (CIPRO) IVPB 400 mg  Status:  Discontinued     Comments: Please mix with NS.      400 mg 200 mL/hr over 60 Minutes Intravenous Every 12 hours 06/03/11 2114 06/03/11 2121   05/30/11 1130   ciprofloxacin (CIPRO) IVPB 400 mg  Status:  Discontinued        400 mg 200 mL/hr over 60 Minutes Intravenous 2 times daily 05/30/11 1038 06/02/11 1618   05/29/11 2100   imipenem-cilastatin (PRIMAXIN) 500 mg in sodium chloride 0.9 % 100 mL IVPB        500 mg 200 mL/hr over 30 Minutes Intravenous Every 8 hours 05/29/11 2007     05/29/11 2100    vancomycin (VANCOCIN) 1,250 mg in sodium chloride 0.9 % 250 mL IVPB  Status:  Discontinued        1,250 mg 166.7 mL/hr over 90 Minutes Intravenous Every 24 hours 05/29/11 2007 06/03/11 1006   05/07/11 1400   imipenem-cilastatin (PRIMAXIN) 500 mg in sodium chloride 0.9 % 100 mL IVPB        500 mg 200 mL/hr over 30 Minutes Intravenous 3 times per day 05/07/11 1106 05/21/11 1359   05/07/11 1200   micafungin (MYCAMINE) 100 mg in sodium chloride 0.9 % 100 mL IVPB  Status:  Discontinued     Comments: PER PHARMACY      100 mg 100 mL/hr over 1 Hours Intravenous Every 24 hours 05/07/11 1032 05/19/11 1103   05/07/11 1200   vancomycin (VANCOCIN) 1,250 mg in sodium chloride 0.9 % 250 mL IVPB  Status:  Discontinued        1,250 mg 166.7 mL/hr over 90 Minutes Intravenous Every 24 hours 05/07/11 1106 05/19/11 1103   05/07/11 1000   micafungin (MYCAMINE) 150 mg in sodium chloride 0.9 % 100 mL IVPB  Status:  Discontinued     Comments: PER PHARMACY      150 mg 100 mL/hr over 1 Hours Intravenous Daily 05/07/11 0951 05/07/11 1029   05/07/11 0300  ertapenem (INVANZ) 1 g in sodium chloride 0.9 % 50 mL IVPB        1 g 100 mL/hr over 30 Minutes Intravenous  Once 05/07/11 0300 05/07/11 0348   05/07/11 0130   cefTRIAXone (ROCEPHIN) 1 g in dextrose 5 % 50 mL IVPB        1 g 100 mL/hr over 30 Minutes Intravenous  Once 05/07/11 0122 05/07/11 0200          Assessment/Plan: s/p Procedure(s): EXPLORATORY LAPAROTOMY GASTROSTOMY GASTRECTOMY CHOLECYSTECTOMY Plan:  Go to low residual diet, and get CT tomorrow.  Try to wean O2.        LOS: 50 days    [name] 06/25/2011

## 2011-06-25 NOTE — Progress Notes (Signed)
PARENTERAL NUTRITION CONSULT NOTE - FOLLOW UP  Pharmacy Consult for TPN Indication: Not tolerating TF  Insulin Requirements in the past 24 hours:  50 units regular insulin in TPN CBG's today as follows:  Ref. Range 06/24/2011 20:09 06/25/2011 00:29 06/25/2011 04:50 06/25/2011 05:40 06/25/2011 08:13  Glucose-Capillary Latest Range: 70-99 mg/dL 454 (H) 098 (H) 119 (H)  170 (H)   Current Nutrition:  Clear liquid diet with supplements TPN with Clinimix E 5/15 @ 80cc/hr, Lipids MWF- ~1600Kcal and 96 g protein daily  Assessment: 77 y/o previously on TPN (for erosive esophagitis with perforated pyloric ulcer s/p lap). S/p gastrostomy, gastrectomy and cholecystectomy 11/7.  TPN initially started 05/12/11 - 05/14/2011. 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 TPN ongoing d/t poor PO intake, changed to continuous 24h infusion d/t low Na. Diet is clear liquids currently.   She is tolerating oral diet without noted diarrhea.  Drains have minimal output.  Pt. pulled out her PICC line - TNA access (placed 12/25), to be replaced per Trauma.  Hopefully can wean and DC TNA soon.  GI Tolerance of small amount of PO intake.  Have not advanced diet yet.  Re-CT abd without new abscesses, old fluid collections unchanged in size.   Endo CBG trending higher - now all > 150.  Will add SSI coverage back to regimen to keep her < 150.  Continue her insulin in TPN.   Lytes/Renal Hyponatremia - back to 129 today, ongoing issue.  Hepatobil Trig and LFTs WNL. Prealbumin trending up nicely-still low for goal 18-40, but anticipate will continue to improve as PO intake improves.  Pulm RA  Cardiology VSS  ID Afeb, WBC WNL. Primaxin # 28 (completed suggested 3 week course per ID on 12/24).  Consider stopping and observing.  All cx neg except Ecoli and PSA UTI  D/c Primaxin or chg to Cipro (PSA was sens), continued therapy does not seem warranted- WBC WNL,  pt afebrile. Abscesses unchanged- noted plans to re-CT later this week.  High risk for C.Diff with ongoing ABX use.  Px SQ hep, IV PPI   Nutritional Goals:  1785-2000 kCal, 95-110 grams of protein per day   Plan: 1. Continue Clinimix E 5/15 at 80 cc/hr *MVI/Trace/IV fats MWF d/t national shortage* 2. Will f/u repeat CT and plans for ongoing TNA nutrition 3. Will f/up AM labs  Nadara Mustard, PharmD., MS  Clinical Pharmacist Pager [phone]. 06/25/2011 10:05 AM  Allergies  Allergen Reactions  . Other Swelling    Pinto beans cause swelling and hives  . Penicillins Swelling    Patient Measurements: Height: 5\' 6"  (167.6 cm) Weight: 162 lb 7.7 oz (73.7 kg) IBW/kg (Calculated) : 59.3   Vital Signs: Temp: 99.6 F (37.6 C) (12/27 0500) Temp src: Oral (12/27 0500) BP: 134/85 mmHg (12/27 0500) Pulse Rate: 84  (12/27 0500)  Intake/Output from previous day: 12/26 0701 - 12/27 0700 In: 1220 [P.O.:160; I.V.:140; IV Piggyback:200; TPN:720] Out: 1970.5 [Urine:1950; Drains:20.5]  Labs:  [facility] 06/25/11 0540 06/24/11 0600 06/23/11 0440  WBC 6.9 6.6 5.8  HGB 9.6* 9.8* 9.9*  HCT 30.0* 30.6* 30.7*  PLT 293 324 375  APTT -- -- --  INR -- -- --     Basename 06/25/11 0540 06/24/11 0600  NA 129* 132*  K 3.9 3.9  CL 96 97  CO2 26 27  GLUCOSE 184* 129*  BUN 15 14  CREATININE 0.44* 0.46*  LABCREA -- --  CREAT24HRUR -- --  CALCIUM  8.7 9.2  MG 1.6 --  PHOS 4.1 --  PROT 6.2 --  ALBUMIN 2.0* --  AST 25 --  ALT 28 --  ALKPHOS 88 --  BILITOT 0.2* --  BILIDIR -- --  IBILI -- --  PREALBUMIN -- --  TRIG -- --  CHOLHDL -- --  CHOL -- --   Estimated Creatinine Clearance: 60.5 ml/min (by C-G formula based on Cr of 0.44).    Basename 06/25/11 0813 06/25/11 0450 06/25/11 0029  GLUCAP 170* 157* 161*

## 2011-06-25 NOTE — Progress Notes (Signed)
Doing well.  CPM.  Advance diet.  Marta Lamas. Gae Bon, MD, FACS [phone] [facility] Surgery

## 2011-06-25 NOTE — Progress Notes (Signed)
49 Days Post-Op  Subjective: Doing some better . Eating grits presently  Objective: Vital signs in last 24 hours: Temp:  [97.9 F (36.6 C)-99.6 F (37.6 C)] 99.6 F (37.6 C) (12/27 0500) Pulse Rate:  [84-98] 84  (12/27 0500) Resp:  [18-20] 18  (12/27 0500) BP: (134-147)/(71-85) 134/85 mmHg (12/27 0500) SpO2:  [96 %-100 %] 100 % (12/27 0500) Weight:  [162 lb 7.7 oz (73.7 kg)] 162 lb 7.7 oz (73.7 kg) (12/27 0500) Last BM Date: 06/24/11  Intake/Output from previous day: 12/26 0701 - 12/27 0700 In: 1220 [P.O.:160; I.V.:140; IV Piggyback:200; TPN:720] Out: 1970.5 [Urine:1950; Drains:20.5] Intake/Output this shift:    Physical exam :  Drains intact.  Thick purulent drainage from epigastric drain - output seems low considering flushing regimen ordered.(15ml daily).  Flushed at bedside without leakage or pain.  Abdomen is soft, few bowel sounds and slightly tender at drain sites.   Lab Results:   Basename 06/25/11 0540 06/24/11 0600  WBC 6.9 6.6  HGB 9.6* 9.8*  HCT 30.0* 30.6*  PLT 293 324   BMET  Basename 06/25/11 0540 06/24/11 0600  NA 129* 132*  K 3.9 3.9  CL 96 97  CO2 26 27  GLUCOSE 184* 129*  BUN 15 14  CREATININE 0.44* 0.46*  CALCIUM 8.7 9.2    Anti-infectives: Anti-infectives     Start     Dose/Rate Route Frequency Ordered Stop   06/03/11 2115   ciprofloxacin (CIPRO) IVPB 400 mg  Status:  Discontinued     Comments: Please mix with NS.      400 mg 200 mL/hr over 60 Minutes Intravenous Every 12 hours 06/03/11 2114 06/03/11 2121   05/30/11 1130   ciprofloxacin (CIPRO) IVPB 400 mg  Status:  Discontinued        400 mg 200 mL/hr over 60 Minutes Intravenous 2 times daily 05/30/11 1038 06/02/11 1618   05/29/11 2100   imipenem-cilastatin (PRIMAXIN) 500 mg in sodium chloride 0.9 % 100 mL IVPB        500 mg 200 mL/hr over 30 Minutes Intravenous Every 8 hours 05/29/11 2007     05/29/11 2100   vancomycin (VANCOCIN) 1,250 mg in sodium chloride 0.9 % 250 mL IVPB   Status:  Discontinued        1,250 mg 166.7 mL/hr over 90 Minutes Intravenous Every 24 hours 05/29/11 2007 06/03/11 1006   05/07/11 1400   imipenem-cilastatin (PRIMAXIN) 500 mg in sodium chloride 0.9 % 100 mL IVPB        500 mg 200 mL/hr over 30 Minutes Intravenous 3 times per day 05/07/11 1106 05/21/11 1359   05/07/11 1200   micafungin (MYCAMINE) 100 mg in sodium chloride 0.9 % 100 mL IVPB  Status:  Discontinued     Comments: PER PHARMACY      100 mg 100 mL/hr over 1 Hours Intravenous Every 24 hours 05/07/11 1032 05/19/11 1103   05/07/11 1200   vancomycin (VANCOCIN) 1,250 mg in sodium chloride 0.9 % 250 mL IVPB  Status:  Discontinued        1,250 mg 166.7 mL/hr over 90 Minutes Intravenous Every 24 hours 05/07/11 1106 05/19/11 1103   05/07/11 1000   micafungin (MYCAMINE) 150 mg in sodium chloride 0.9 % 100 mL IVPB  Status:  Discontinued     Comments: PER PHARMACY      150 mg 100 mL/hr over 1 Hours Intravenous Daily 05/07/11 0951 05/07/11 1029   05/07/11 0300   ertapenem (INVANZ) 1 g in  sodium chloride 0.9 % 50 mL IVPB        1 g 100 mL/hr over 30 Minutes Intravenous  Once 05/07/11 0300 05/07/11 0348   05/07/11 0130   cefTRIAXone (ROCEPHIN) 1 g in dextrose 5 % 50 mL IVPB        1 g 100 mL/hr over 30 Minutes Intravenous  Once 05/07/11 0122 05/07/11 0200          Assessment/Plan: s/p Procedure(s):  EXPLORATORY LAPAROTOMYGASTROSTOMY, GASTRECTOMY, CHOLECYSTECTOMY and percutaneous abscess drain placed 05/31/11.  To continue drain and flushes today - CCS has ordered CT for tomorrow to f/u on collections. Will follow .  Luellen Howson D 06/25/2011

## 2011-06-25 NOTE — Progress Notes (Signed)
Utilization Review Completed.[place] Hair T12/27/2012   

## 2011-06-26 ENCOUNTER — Inpatient Hospital Stay (HOSPITAL_COMMUNITY): Payer: Medicare HMO

## 2011-06-26 LAB — GLUCOSE, CAPILLARY
Glucose-Capillary: 133 mg/dL — ABNORMAL HIGH (ref 70–99)
Glucose-Capillary: 151 mg/dL — ABNORMAL HIGH (ref 70–99)
Glucose-Capillary: 141 mg/dL — ABNORMAL HIGH (ref 70–99)
Glucose-Capillary: 136 mg/dL — ABNORMAL HIGH (ref 70–99)

## 2011-06-26 LAB — CBC
HCT: 31 % — ABNORMAL LOW (ref 36.0–46.0)
MCHC: 31.9 g/dL (ref 30.0–36.0)
Platelets: 295 10*3/uL (ref 150–400)
RDW: 16.2 % — ABNORMAL HIGH (ref 11.5–15.5)

## 2011-06-26 MED ORDER — TRACE MINERALS CR-CU-MN-SE-ZN 10-1000-500-60 MCG/ML IV SOLN
INTRAVENOUS | Status: AC
  Administered 2011-06-26: 18:00:00 via INTRAVENOUS
  Filled 2011-06-26: qty 2000

## 2011-06-26 MED ORDER — IOHEXOL 300 MG/ML  SOLN
80.0000 mL | Freq: Once | INTRAMUSCULAR | Status: AC | PRN
  Administered 2011-06-26: 80 mL via INTRAVENOUS

## 2011-06-26 MED ORDER — FAT EMULSION 20 % IV EMUL
240.0000 mL | INTRAVENOUS | Status: AC
  Administered 2011-06-26: 240 mL via INTRAVENOUS
  Filled 2011-06-26: qty 250

## 2011-06-26 MED ORDER — INSULIN ASPART 100 UNIT/ML ~~LOC~~ SOLN
0.0000 [IU] | SUBCUTANEOUS | Status: DC
  Administered 2011-06-26 (×3): 1 [IU] via SUBCUTANEOUS
  Administered 2011-06-26: 2 [IU] via SUBCUTANEOUS
  Administered 2011-06-27: 1 [IU] via SUBCUTANEOUS
  Administered 2011-06-27: 2 [IU] via SUBCUTANEOUS
  Administered 2011-06-27 – 2011-06-29 (×7): 1 [IU] via SUBCUTANEOUS
  Administered 2011-06-29 (×2): 2 [IU] via SUBCUTANEOUS
  Administered 2011-06-29 (×3): 1 [IU] via SUBCUTANEOUS
  Administered 2011-06-30: 2 [IU] via SUBCUTANEOUS
  Administered 2011-06-30 – 2011-07-01 (×4): 1 [IU] via SUBCUTANEOUS
  Administered 2011-07-01: 2 [IU] via SUBCUTANEOUS
  Administered 2011-07-01 – 2011-07-03 (×11): 1 [IU] via SUBCUTANEOUS
  Administered 2011-07-03: 2 [IU] via SUBCUTANEOUS
  Administered 2011-07-03 – 2011-07-04 (×5): 1 [IU] via SUBCUTANEOUS

## 2011-06-26 NOTE — Progress Notes (Addendum)
Subjective:  Patient seen and examined, denies any complaints.  Objective: Vital signs in last 24 hours: Temp:  [98.4 F (36.9 C)-99.7 F (37.6 C)] 98.7 F (37.1 C) (12/28 1405) Pulse Rate:  [79-90] 83  (12/28 1405) Resp:  [20] 20  (12/28 1405) BP: (142-156)/(67-88) 142/67 mmHg (12/28 1405) SpO2:  [97 %-100 %] 100 % (12/28 1405) Weight:  [74.5 kg (164 lb 3.9 oz)] 164 lb 3.9 oz (74.5 kg) (12/28 0508) Weight change: 0.8 kg (1 lb 12.2 oz) Last BM Date: 06/24/11  Intake/Output from previous day: 12/27 0701 - 12/28 0700 In: 1370 [P.O.:270; I.V.:40; TPN:960] Out: 3041 [Urine:3025; Drains:16] Total I/O In: 0  Out: 650 [Urine:650]   Physical Exam: General: Alert, awake, in no acute distress.  Heart: Regular rate and rhythm, without murmurs  Lungs: Clear to auscultation bilaterally.  Abdomen: Soft, mildly tender, nondistended, positive bowel sounds. Dressing and drains in place.  Extremities: No clubbing cyanosis or edema with positive pedal pulses.  Neuro: Grossly intact, nonfocal     Lab Results: Results for orders placed during the hospital encounter of 05/06/11 (from the past 24 hour(s))  GLUCOSE, CAPILLARY     Status: Abnormal   Collection Time   06/25/11  8:06 PM      Component Value Range   Glucose-Capillary 126 (*) 70 - 99 (mg/dL)   Comment 1 Documented in Chart     Comment 2 Notify RN    GLUCOSE, CAPILLARY     Status: Abnormal   Collection Time   06/25/11 11:39 PM      Component Value Range   Glucose-Capillary 140 (*) 70 - 99 (mg/dL)   Comment 1 Notify RN    GLUCOSE, CAPILLARY     Status: Abnormal   Collection Time   06/26/11  4:20 AM      Component Value Range   Glucose-Capillary 133 (*) 70 - 99 (mg/dL)   Comment 1 Notify RN    CBC     Status: Abnormal   Collection Time   06/26/11  5:45 AM      Component Value Range   WBC 7.4  4.0 - 10.5 (K/uL)   RBC 3.66 (*) 3.87 - 5.11 (MIL/uL)   Hemoglobin 9.9 (*) 12.0 - 15.0 (g/dL)   HCT 04.5 (*) 40.9 - 46.0 (%)     MCV 84.7  78.0 - 100.0 (fL)   MCH 27.0  26.0 - 34.0 (pg)   MCHC 31.9  30.0 - 36.0 (g/dL)   RDW 81.1 (*) 91.4 - 15.5 (%)   Platelets 295  150 - 400 (K/uL)  GLUCOSE, CAPILLARY     Status: Abnormal   Collection Time   06/26/11  8:21 AM      Component Value Range   Glucose-Capillary 151 (*) 70 - 99 (mg/dL)    Studies/Results: Ct Abdomen Pelvis W Contrast  06/26/2011  *RADIOLOGY REPORT*  Clinical Data: Intra-abdominal abscess  CT ABDOMEN AND   IMPRESSION: Near complete resolution of fluid collection anterior to the left hepatic level with indwelling pigtail drain, now measuring 3.8 x 0.9 cm.  Additional 2.0 x 2.9 cm fluid collection medial to the left hepatic lobe, improved.  Stable ancillary findings as above.  Original Report Authenticated By: Charline Bills, M.D.    Medications:    . cloNIDine  0.1 mg Transdermal Weekly  . feeding supplement  237 mL Oral TID WC  . feeding supplement  1 Container Oral TID WC  . imipenem-cilastatin  500 mg Intravenous Q8H  . insulin  aspart  0-9 Units Subcutaneous Q4H  . lip balm  1 application Topical BID  . metoprolol tartrate  12.5 mg Oral BID  . pantoprazole  40 mg Oral BID AC  . DISCONTD: insulin aspart  0-9 Units Subcutaneous Q6H    acetaminophen, hydrALAZINE, iohexol, LORazepam, morphine injection, ondansetron (ZOFRAN) IV, oxyCODONE, phenol, sodium chloride     . sodium chloride 20 mL/hr at 06/25/11 0449  . fat emulsion 240 mL (06/24/11 2200)  . fat emulsion    . TPN (CLINIMIX) +/- additives 80 mL/hr at 06/26/11 0523  . TPN (CLINIMIX) +/- additives 80 mL/hr at 06/24/11 2200  . TPN (CLINIMIX) +/- additives      Assessment/Plan:  1. Intra-abdominal abscess  Perc drain per IR 12/02   repeat CT showed improvement as above We'll discontinue Meropenem (completed 28days)  Clinically improving, diet is being advanced  2. Perforated peptic ulcer  S/p Ex-Lap with Lysis of adhesions, Antrectomy with duodenal resect. Billroth 2  reconstruction, omental patching of Duodenal bulb, Open Cholecystectomy, Gastrostomy and Jejunostomy on 11/08  3. Esophagitis, erosive  - continue protonix  4. DM: Stable on SSI  5. Malnutrition  on TNA, diet advance as per CCS  6. Hyponatremia: chronic, stable  7. Deconditioning/ Disposition: Continue PT/OT .plans for SNF on discharge      LOS: 51 days   [name] 06/26/2011, 4:49 PM

## 2011-06-26 NOTE — Progress Notes (Signed)
PT NOTE: PT attempted to work with pt, lift equipment brought into room for nursing for later however as PT began treatment pt had to be taken to radiology. Will attempt later today as time allows. Thank you!  [name] ([name]) [name] PT, DPT Acute Rehabilitation [phone]

## 2011-06-26 NOTE — Progress Notes (Signed)
50 Days Post-Op  Subjective: .Soft low residual diet yesterday.  Doing well so far. CT for today.  Pt. Isn't moving at all.  Will make sure PT & OT work with patient.  16ml drainage recorded.  Abscess still white purulent material   Objective: Vital signs in last 24 hours: Temp:  [98.4 F (36.9 C)-99.9 F (37.7 C)] 98.4 F (36.9 C) (12/28 0508) Pulse Rate:  [79-90] 79  (12/28 0508) Resp:  [20] 20  (12/28 0508) BP: (143-156)/(69-88) 143/69 mmHg (12/28 0508) SpO2:  [96 %-98 %] 97 % (12/28 0508) Weight:  [74.5 kg (164 lb 3.9 oz)] 164 lb 3.9 oz (74.5 kg) (12/28 0508) Last BM Date: 06/24/11  Intake/Output from previous day: 12/27 0701 - 12/28 0700 In: 1370 [P.O.:270; I.V.:40; TPN:960] Out: 3041 [Urine:3025; Drains:16] Intake/Output this shift:    GI: soft, nontender. tolerating po's  Lab Results:   Basename 06/26/11 0545 06/25/11 0540  WBC 7.4 6.9  HGB 9.9* 9.6*  HCT 31.0* 30.0*  PLT 295 293   BMET  Basename 06/25/11 0540 06/24/11 0600  NA 129* 132*  K 3.9 3.9  CL 96 97  CO2 26 27  GLUCOSE 184* 129*  BUN 15 14  CREATININE 0.44* 0.46*  CALCIUM 8.7 9.2   PT/INR No results found for this basename: LABPROT:2,INR:2 in the last 72 hours ABG No results found for this basename: PHART:2,PCO2:2,PO2:2,HCO3:2 in the last 72 hours  Studies/Results: No results found.  Anti-infectives: Anti-infectives     Start     Dose/Rate Route Frequency Ordered Stop   06/03/11 2115   ciprofloxacin (CIPRO) IVPB 400 mg  Status:  Discontinued     Comments: Please mix with NS.      400 mg 200 mL/hr over 60 Minutes Intravenous Every 12 hours 06/03/11 2114 06/03/11 2121   05/30/11 1130   ciprofloxacin (CIPRO) IVPB 400 mg  Status:  Discontinued        400 mg 200 mL/hr over 60 Minutes Intravenous 2 times daily 05/30/11 1038 06/02/11 1618   05/29/11 2100   imipenem-cilastatin (PRIMAXIN) 500 mg in sodium chloride 0.9 % 100 mL IVPB        500 mg 200 mL/hr over 30 Minutes Intravenous Every 8  hours 05/29/11 2007     05/29/11 2100   vancomycin (VANCOCIN) 1,250 mg in sodium chloride 0.9 % 250 mL IVPB  Status:  Discontinued        1,250 mg 166.7 mL/hr over 90 Minutes Intravenous Every 24 hours 05/29/11 2007 06/03/11 1006   05/07/11 1400   imipenem-cilastatin (PRIMAXIN) 500 mg in sodium chloride 0.9 % 100 mL IVPB        500 mg 200 mL/hr over 30 Minutes Intravenous 3 times per day 05/07/11 1106 05/21/11 1359   05/07/11 1200   micafungin (MYCAMINE) 100 mg in sodium chloride 0.9 % 100 mL IVPB  Status:  Discontinued     Comments: PER PHARMACY      100 mg 100 mL/hr over 1 Hours Intravenous Every 24 hours 05/07/11 1032 05/19/11 1103   05/07/11 1200   vancomycin (VANCOCIN) 1,250 mg in sodium chloride 0.9 % 250 mL IVPB  Status:  Discontinued        1,250 mg 166.7 mL/hr over 90 Minutes Intravenous Every 24 hours 05/07/11 1106 05/19/11 1103   05/07/11 1000   micafungin (MYCAMINE) 150 mg in sodium chloride 0.9 % 100 mL IVPB  Status:  Discontinued     Comments: PER PHARMACY      150  mg 100 mL/hr over 1 Hours Intravenous Daily 05/07/11 0951 05/07/11 1029   05/07/11 0300   ertapenem (INVANZ) 1 g in sodium chloride 0.9 % 50 mL IVPB        1 g 100 mL/hr over 30 Minutes Intravenous  Once 05/07/11 0300 05/07/11 0348   05/07/11 0130   cefTRIAXone (ROCEPHIN) 1 g in dextrose 5 % 50 mL IVPB        1 g 100 mL/hr over 30 Minutes Intravenous  Once 05/07/11 0122 05/07/11 0200          Assessment/Plan: s/p Procedure(s): EXPLORATORY LAPAROTOMY GASTROSTOMY GASTRECTOMY CHOLECYSTECTOMY Plan:  Low residual diet, CT today. Start clamping foley try to get out again.     LOS: 51 days    Shahed Yeoman 06/26/2011

## 2011-06-26 NOTE — Progress Notes (Addendum)
Physical Therapy Treatment [name] MRN: [id number] DOB: 1931-10-16 Today's Date: 06/26/2011  PT Assessment/Plan  PT - Assessment/Plan Comments on Treatment Session: Pt performing better than expected today requiring min assistance to stand and moderate assistance to transfer. Her mobility and contribution to mobility appears to be closely correlated to pt's willingness to participate. Pt encouraged to stay in chair for 1 to 1.5 hours, nursing aware. Pt continues to have significant weakness and decreased cognition and will need SNF for strengthening, endurance, and improved independence with mobility. Pt met 3 goals today which will be revised to higher level. Pt's SpO2 was >/= 97% throughout treatment today on room air. PT Plan: Discharge plan remains appropriate PT Frequency: Min 3X/week Follow Up Recommendations: Skilled nursing facility Equipment Recommended: Defer to next venue PT Goals  Acute Rehab PT Goals PT Goal Formulation: With patient Time For Goal Achievement: 2 weeks Pt will go Supine/Side to Sit: with min assist PT Goal: Supine/Side to Sit - Progress: Met Pt will go Sit to Supine/Side: with min assist PT Goal: Sit to Supine/Side - Progress: Met Pt will Transfer Bed to Chair/Chair to Bed: with mod assist PT Transfer Goal: Bed to Chair/Chair to Bed - Progress: Met Pt will Ambulate: 1 - 15 feet;with mod assist;with rolling walker PT Goal: Ambulate - Progress: Progressing toward goal  PT Treatment Precautions/Restrictions  Precautions Precautions: Fall Precaution Comments: Weakness. Pt also with 2 JP drains Restrictions Weight Bearing Restrictions: No Mobility (including Balance) Bed Mobility Bed Mobility: Yes Left Sidelying to Sit:  (max verbal cues to initiate) Supine to Sit: 4: Min assist Supine to Sit Details (indicate cue type and reason): Assist for tactile cues for Bil. LEs only, pt able to upright trunk without physical assistance.  Performed 2x.  Sitting - Scoot to Edge of Bed: 5: Supervision Sitting - Scoot to [place] of Bed Details (indicate cue type and reason): Verbal cues for initiation.  Sit to Supine - Right: 5: Supervision (Pt. initiated lifting legs) Sit to Supine - Right Details (indicate cue type and reason): Supervision, pt performing impulsively .  Transfers Transfers: Yes Sit to Stand: From bed;4: Min assist;With upper extremity assist Sit to Stand Details (indicate cue type and reason): Verbal cues for safety as pt attempting prior to assistance. Min assist secondary to weakness.  Stand to Sit: To chair/3-in-1;4: Min assist Stand to Sit Details: Verbal cues to control descent, square up with chair prior to descent.  Stand Pivot Transfers: 3: Mod assist Stand Pivot Transfer Details (indicate cue type and reason): Moderate initially with attempted pivoting of feet, then min assist after pt able to use armrest of chair to guide self.  Ambulation/Gait Ambulation/Gait: No    Exercise  General Exercises - Lower Extremity Ankle Circles/Pumps:  (into pillow) Long Arc Quad: Seated;Both;AROM;20 reps Hip Flexion/Marching: AROM;Both;5 reps;Seated (pt reverts to long arc quads secondary to decr. memory) End of Session PT - End of Session Equipment Utilized During Treatment: Gait belt Activity Tolerance: Patient limited by pain;Patient tolerated treatment well;Patient limited by fatigue Patient left: in chair;with call bell in reach Nurse Communication: Mobility status for transfers General Behavior During Session: [facility] for tasks performed Cognition: Impaired (Pt having difficulty focusing on task. Easily distracted) Cognitive Impairment: Decreased memory and requesting return to supine ~ every 5 minutes, decreased ability to understand need for increased mobility.   [name] 06/26/2011, 2:24 PM  [name]) [name] PT, DPT Acute Rehabilitation [phone]

## 2011-06-26 NOTE — Progress Notes (Signed)
PARENTERAL NUTRITION CONSULT NOTE - FOLLOW UP  Pharmacy Consult for TPN Indication: TF intolerance  Allergies  Allergen Reactions  . Other Swelling    Pinto beans cause swelling and hives  . Penicillins Swelling    Patient Measurements: Height: 5\' 6"  (167.6 cm) Weight: 164 lb 3.9 oz (74.5 kg) IBW/kg (Calculated) : 59.3   Vital Signs: Temp: 98.4 F (36.9 C) (12/28 0508) Temp src: Oral (12/28 0508) BP: 143/69 mmHg (12/28 0508) Pulse Rate: 79  (12/28 0508) Intake/Output from previous day: 12/27 0701 - 12/28 0700 In: 1370 [P.O.:270; I.V.:40; TPN:960] Out: 3041 [Urine:3025; Drains:16] Intake/Output from this shift:    Labs:  [facility] 06/26/11 0545 06/25/11 0540 06/24/11 0600  WBC 7.4 6.9 6.6  HGB 9.9* 9.6* 9.8*  HCT 31.0* 30.0* 30.6*  PLT 295 293 324  APTT -- -- --  INR -- -- --     Basename 06/25/11 0540 06/24/11 0600  NA 129* 132*  K 3.9 3.9  CL 96 97  CO2 26 27  GLUCOSE 184* 129*  BUN 15 14  CREATININE 0.44* 0.46*  LABCREA -- --  CREAT24HRUR -- --  CALCIUM 8.7 9.2  MG 1.6 --  PHOS 4.1 --  PROT 6.2 --  ALBUMIN 2.0* --  AST 25 --  ALT 28 --  ALKPHOS 88 --  BILITOT 0.2* --  BILIDIR -- --  IBILI -- --  PREALBUMIN -- --  TRIG -- --  CHOLHDL -- --  CHOL -- --   Estimated Creatinine Clearance: 60.8 ml/min (by C-G formula based on Cr of 0.44).    Basename 06/26/11 0821 06/26/11 0420 06/25/11 2339  GLUCAP 151* 133* 140*    Medications:  Scheduled:    . cloNIDine  0.1 mg Transdermal Weekly  . feeding supplement  237 mL Oral TID WC  . feeding supplement  1 Container Oral TID WC  . imipenem-cilastatin  500 mg Intravenous Q8H  . insulin aspart  0-9 Units Subcutaneous Q4H  . lip balm  1 application Topical BID  . metoprolol tartrate  12.5 mg Oral BID  . pantoprazole  40 mg Oral BID AC  . DISCONTD: insulin aspart  0-9 Units Subcutaneous Q6H    Insulin Requirements in the past 24 hours:  5 units SSI for CBG 126-151 50units insulin in  TPN  Current Nutrition:  Clinimix E5/15 TPN at 106ml/hr + fats 30ml/hr + MVI + trace elements Provides 96gm protein + ~1600kal  Assessment: 79 yof previously on TPN (for erosive esophagitis with perforated pyloric ulcer s/p lap). S/p gastrostomy, gastrectomy and cholecystectomy 11/7. TPN initially started 05/12/11 - 05/14/2011. 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 TPN ongoing d/t poor PO intake, changed to continuous 24h infusion d/t low Na. No new labs today. Soft diet being tolerated per MD. PO intake not recorded accurately but feeding supplements being charted as given per MAR. Plan for CT today. Hopefully diet with be advanced and TPN will be dc'd soon.   Nutritional Goals:  1785-2000 kCal, 95-110 grams of protein per day  Plan:  1. Continue Clinimix E 5/15 at 62ml/hr 2. Fats at 36ml/hr + trace elements + multivitamin MWF (d/t national backorder) 3. F/u CT today 4. F/u AM labs 5. F/u diet advancement and possible TPN dc soon  [name], [name] 06/26/2011,10:25 AM

## 2011-06-26 NOTE — Progress Notes (Signed)
Occupational Therapy Treatment [name] MRN: [id number] DOB: 01/30/1932 Today's Date: 06/26/2011  OT Assessment/Plan OT Assessment/Plan Comments on Treatment Session: Pt. moving better today, but participation still is limited.   OT Plan: Discharge plan remains appropriate OT Frequency: Min 1X/week Follow Up Recommendations: Skilled nursing facility Equipment Recommended: Defer to next venue OT Goals ADL Goals ADL Goal: Grooming - Progress: Progressing toward goals ADL Goal: Toilet Transfer - Progress: Progressing toward goals ADL Goal: Additional Goal #1 - Progress: Progressing toward goals  OT Treatment Precautions/Restrictions  Precautions Precautions: Fall Precaution Comments: Weakness. Pt also with 2 JP drains   ADL ADL Grooming: Performed;Wash/dry face Where Assessed - Grooming: Sitting, chair;Supported Where Assessed - Upper Body Dressing: Sitting, bed (Pt. leaned forward to adjust socks, refused further ) Lower Body Dressing: +1 Total assistance Toilet Transfer: Moderate assistance;Simulated Toilet Transfer Details (indicate cue type and reason): Pt. transferred bed to chair.  Sit to stand with min A with UE support, and transferred with mod A.  Pt. with suffling steps, and flexion of trunk. Toilet Transfer Method: Surveyor, minerals: Set designer - Clothing Manipulation: Simulated;Maximal assistance Where Assessed - Glass blower/designer Manipulation: Standing ADL Comments: Co-treat with PT due to pt. previously requiring +2 assistance for all mobility.  Pt. moved supine to EOB with min A and mod verbal cues to initiate and perform activity.  Pt. washed face sitting in chair, but refused all other activity. Mobility  Bed Mobility Bed Mobility: Yes Rolling Right: 6: Modified independent (Device/Increase time);With rail Left Sidelying to Sit:  (max verbal cues to initiate) Supine to Sit: 4: Min assist Supine to  Sit Details (indicate cue type and reason): Assist for tactile cues for Bil. LEs only, pt able to upright trunk without physical assistance. Performed 2x.  Sitting - Scoot to Edge of Bed: 5: Supervision Sitting - Scoot to [place] of Bed Details (indicate cue type and reason): Verbal cues for initiation.  Sit to Supine - Right: 5: Supervision (Pt. initiated lifting legs) Sit to Supine - Right Details (indicate cue type and reason): Supervision, pt performing impulsively .  Transfers Transfers: Yes Sit to Stand: 4: Min assist;With upper extremity assist Sit to Stand Details (indicate cue type and reason): Verbal cues for safety as pt attempting prior to assistance. Min assist secondary to weakness.  Stand to Sit: 4: Min assist;To chair/3-in-1 Stand to Sit Details: Verbal cues to control descent, square up with chair prior to descent.  End of Session OT - End of Session Equipment Utilized During Treatment: Gait belt Activity Tolerance: Other (comment) (limited by pt. participation) Patient left: in bed;with call bell in reach Nurse Communication: Mobility status for transfers General Behavior During Session: [facility] for tasks performed Cognition: Impaired Cognitive Impairment: Decreased memory and requesting return to supine ~ every 5 minutes, decreased ability to understand need for increased mobility.   [name]  06/26/2011, 2:52 PM

## 2011-06-26 NOTE — Progress Notes (Signed)
The patient's diet has been advanced.  [name]. Gae Bon, MD, FACS [phone] [facility] Surgery

## 2011-06-27 LAB — CBC
HCT: 34.5 % — ABNORMAL LOW (ref 36.0–46.0)
Hemoglobin: 11.1 g/dL — ABNORMAL LOW (ref 12.0–15.0)
MCH: 27.4 pg (ref 26.0–34.0)
MCHC: 32.2 g/dL (ref 30.0–36.0)
RBC: 4.05 MIL/uL (ref 3.87–5.11)

## 2011-06-27 LAB — BASIC METABOLIC PANEL
BUN: 14 mg/dL (ref 6–23)
CO2: 26 mEq/L (ref 19–32)
Glucose, Bld: 130 mg/dL — ABNORMAL HIGH (ref 70–99)
Potassium: 4 mEq/L (ref 3.5–5.1)
Sodium: 130 mEq/L — ABNORMAL LOW (ref 135–145)

## 2011-06-27 LAB — GLUCOSE, CAPILLARY
Glucose-Capillary: 127 mg/dL — ABNORMAL HIGH (ref 70–99)
Glucose-Capillary: 136 mg/dL — ABNORMAL HIGH (ref 70–99)

## 2011-06-27 MED ORDER — INSULIN REGULAR HUMAN 100 UNIT/ML IJ SOLN
INTRAMUSCULAR | Status: AC
  Administered 2011-06-27: 18:00:00 via INTRAVENOUS
  Filled 2011-06-27: qty 2000

## 2011-06-27 NOTE — Progress Notes (Signed)
51 Days Post-Op  Subjective: Epigastric abscess drain placed 12/2 Still has purulent output Feels better CT 12/28 shows good improvement in collection-not gone   Objective: Vital signs in last 24 hours: Temp:  [97.4 F (36.3 C)-99.4 F (37.4 C)] 99.4 F (37.4 C) (12/29 0700) Pulse Rate:  [77-100] 100  (12/29 0957) Resp:  [16-24] 16  (12/29 0700) BP: (120-156)/(60-93) 140/70 mmHg (12/29 0957) SpO2:  [91 %-100 %] 96 % (12/29 0500) Weight:  [159 lb 13.3 oz (72.5 kg)] 159 lb 13.3 oz (72.5 kg) (12/29 0500) Last BM Date: 06/26/11  Intake/Output from previous day: 12/28 0701 - 12/29 0700 In: 320 [P.O.:120; I.V.:200] Out: 2902 [Urine:2900; Drains:2] Intake/Output this shift: Total I/O In: 40 [P.O.:40] Out: 475 [Urine:475]  PE:  Drain intact. Site clean and dry; NT; JP charged Output purulent  Lab Results:   Basename 06/27/11 0500 06/26/11 0545  WBC 7.6 7.4  HGB 11.1* 9.9*  HCT 34.5* 31.0*  PLT 302 295   BMET  Basename 06/27/11 0500 06/25/11 0540  NA 130* 129*  K 4.0 3.9  CL 95* 96  CO2 26 26  GLUCOSE 130* 184*  BUN 14 15  CREATININE 0.47* 0.44*  CALCIUM 9.5 8.7   PT/INR No results found for this basename: LABPROT:2,INR:2 in the last 72 hours ABG No results found for this basename: PHART:2,PCO2:2,PO2:2,HCO3:2 in the last 72 hours  Studies/Results: Ct Abdomen Pelvis W Contrast  06/26/2011  *RADIOLOGY REPORT*  Clinical Data: Intra-abdominal abscess  CT ABDOMEN AND PELVIS WITH CONTRAST  Technique:  Multidetector CT imaging of the abdomen and pelvis was performed following the standard protocol during bolus administration of intravenous contrast.  Contrast: 80mL OMNIPAQUE IOHEXOL 300 MG/ML IV SOLN  Comparison: 06/15/2011  Findings: Trace bilateral pleural effusions with associated lower lobe atelectasis.  Mild cardiomegaly with a small pericardial effusion.  Coronary atherosclerosis.  Liver, spleen, and adrenal glands are within normal limits.  Pancreas is notable for  a subcentimeter hypodense lesion in the pancreatic tail (series 2/image 30).  Stable hypodense bilateral renal lesions.  No hydronephrosis.  Gastrostomy tube.  Status post Billroth II.  No evidence of bowel obstruction.  Normal appendix.  Colonic diverticulosis, without associated inflammatory changes.  Atherosclerotic calcifications of the abdominal aorta and branch vessels.  Near complete resolution of fluid collection anterior to the lateral left hepatic lobe, measuring approximately 3.8 x 0.9 cm (series 2/image 25), with indwelling pigtail drain.  Additional 2.0 x 2.9 cm fluid collection just medial to the left hepatic lobe is improved (series 2/image 31).  No suspicious abdominopelvic lymphadenopathy.  Peroneal dialysis catheter.  Uterus and bilateral ovaries are unremarkable.  Bladder is decompressed by indwelling Foley catheter.  Degenerative changes of the visualized thoracolumbar spine.  Right total hip arthroplasty without evidence of hardware complication.  IMPRESSION: Near complete resolution of fluid collection anterior to the left hepatic level with indwelling pigtail drain, now measuring 3.8 x 0.9 cm.  Additional 2.0 x 2.9 cm fluid collection medial to the left hepatic lobe, improved.  Stable ancillary findings as above.  Original Report Authenticated By: Charline Bills, M.D.    Anti-infectives: Anti-infectives     Start     Dose/Rate Route Frequency Ordered Stop   06/03/11 2115   ciprofloxacin (CIPRO) IVPB 400 mg  Status:  Discontinued     Comments: Please mix with NS.      400 mg 200 mL/hr over 60 Minutes Intravenous Every 12 hours 06/03/11 2114 06/03/11 2121   05/30/11 1130   ciprofloxacin (CIPRO)  IVPB 400 mg  Status:  Discontinued        400 mg 200 mL/hr over 60 Minutes Intravenous 2 times daily 05/30/11 1038 06/02/11 1618   05/29/11 2100   imipenem-cilastatin (PRIMAXIN) 500 mg in sodium chloride 0.9 % 100 mL IVPB  Status:  Discontinued        500 mg 200 mL/hr over 30 Minutes  Intravenous Every 8 hours 05/29/11 2007 06/26/11 1702   05/29/11 2100   vancomycin (VANCOCIN) 1,250 mg in sodium chloride 0.9 % 250 mL IVPB  Status:  Discontinued        1,250 mg 166.7 mL/hr over 90 Minutes Intravenous Every 24 hours 05/29/11 2007 06/03/11 1006   05/07/11 1400   imipenem-cilastatin (PRIMAXIN) 500 mg in sodium chloride 0.9 % 100 mL IVPB        500 mg 200 mL/hr over 30 Minutes Intravenous 3 times per day 05/07/11 1106 05/21/11 1359   05/07/11 1200   micafungin (MYCAMINE) 100 mg in sodium chloride 0.9 % 100 mL IVPB  Status:  Discontinued     Comments: PER PHARMACY      100 mg 100 mL/hr over 1 Hours Intravenous Every 24 hours 05/07/11 1032 05/19/11 1103   05/07/11 1200   vancomycin (VANCOCIN) 1,250 mg in sodium chloride 0.9 % 250 mL IVPB  Status:  Discontinued        1,250 mg 166.7 mL/hr over 90 Minutes Intravenous Every 24 hours 05/07/11 1106 05/19/11 1103   05/07/11 1000   micafungin (MYCAMINE) 150 mg in sodium chloride 0.9 % 100 mL IVPB  Status:  Discontinued     Comments: PER PHARMACY      150 mg 100 mL/hr over 1 Hours Intravenous Daily 05/07/11 0951 05/07/11 1029   05/07/11 0300   ertapenem (INVANZ) 1 g in sodium chloride 0.9 % 50 mL IVPB        1 g 100 mL/hr over 30 Minutes Intravenous  Once 05/07/11 0300 05/07/11 0348   05/07/11 0130   cefTRIAXone (ROCEPHIN) 1 g in dextrose 5 % 50 mL IVPB        1 g 100 mL/hr over 30 Minutes Intravenous  Once 05/07/11 0122 05/07/11 0200          Assessment/Plan: s/p Procedure(s): EXPLORATORY LAPAROTOMY GASTROSTOMY GASTRECTOMY CHOLECYSTECTOMY  Epigastric drain intact. Pt better. CT shows collection improvement but not gone Will follow Plan per surgery   Dwight Adamczak A 06/27/2011

## 2011-06-27 NOTE — Progress Notes (Signed)
PARENTERAL NUTRITION CONSULT NOTE - FOLLOW UP  Pharmacy Consult for TPN Indication: TF intolerance  Allergies  Allergen Reactions  . Other Swelling    Pinto beans cause swelling and hives  . Penicillins Swelling    Patient Measurements: Height: 5\' 6"  (167.6 cm) Weight: 159 lb 13.3 oz (72.5 kg) IBW/kg (Calculated) : 59.3   Vital Signs: Temp: 99.4 F (37.4 C) (12/29 0700) Temp src: Oral (12/29 0500) BP: 140/70 mmHg (12/29 0957) Pulse Rate: 100  (12/29 0957) Intake/Output from previous day: 12/28 0701 - 12/29 0700 In: 320 [P.O.:120; I.V.:200] Out: 2902 [Urine:2900; Drains:2] Intake/Output from this shift:    Labs:  [facility] 06/27/11 0500 06/26/11 0545 06/25/11 0540  WBC 7.6 7.4 6.9  HGB 11.1* 9.9* 9.6*  HCT 34.5* 31.0* 30.0*  PLT 302 295 293  APTT -- -- --  INR -- -- --     Basename 06/27/11 0500 06/25/11 0540  NA 130* 129*  K 4.0 3.9  CL 95* 96  CO2 26 26  GLUCOSE 130* 184*  BUN 14 15  CREATININE 0.47* 0.44*  LABCREA -- --  CREAT24HRUR -- --  CALCIUM 9.5 8.7  MG -- 1.6  PHOS -- 4.1  PROT -- 6.2  ALBUMIN -- 2.0*  AST -- 25  ALT -- 28  ALKPHOS -- 88  BILITOT -- 0.2*  BILIDIR -- --  IBILI -- --  PREALBUMIN -- --  TRIG -- --  CHOLHDL -- --  CHOL -- --   Estimated Creatinine Clearance: 60.1 ml/min (by C-G formula based on Cr of 0.47).    Basename 06/27/11 0744 06/27/11 0430 06/27/11 0119  GLUCAP 140* 136* 127*    Medications:  Scheduled:     . cloNIDine  0.1 mg Transdermal Weekly  . feeding supplement  237 mL Oral TID WC  . feeding supplement  1 Container Oral TID WC  . insulin aspart  0-9 Units Subcutaneous Q4H  . lip balm  1 application Topical BID  . metoprolol tartrate  12.5 mg Oral BID  . pantoprazole  40 mg Oral BID AC  . DISCONTD: imipenem-cilastatin  500 mg Intravenous Q8H    Insulin Requirements in the past 24 hours:  5 units SSI for CBG 127-141 50units insulin in TPN  Current Nutrition:  Clinimix E5/15 TPN at 26ml/hr +  fats 68ml/hr + MVI + trace elements Provides 96gm protein + ~1600kal  Assessment: 79 yof previously on TPN (for erosive esophagitis with perforated pyloric ulcer s/p lap). S/p gastrostomy, gastrectomy and cholecystectomy 11/7. TPN initially started 05/12/11 - 05/14/2011. 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 TPN ongoing d/t poor PO intake, changed to continuous 24h infusion d/t low Na. No new labs today. Soft diet being tolerated per MD. PO intake not recorded accurately but per report it is low.  Meal documentation is inconsistent and some feeding supplements are charted as given. CT shows continued improvement.   Nutritional Goals:  1785-2000 kCal, 95-110 grams of protein per day  Plan:  1. Continue Clinimix E 5/15 at 44ml/hr 2. Fats at 12ml/hr + trace elements + multivitamin MWF (d/t national backorder) 3. F/u AM labs 4. F/u diet advancement and tolerance.  Anticipate possible discontinuation of TPN soon with continued clinical improvement and achievement of adequate PO intake.  Estella Husk, Pharm.D., BCPS Clinical Pharmacist  Pager [phone] 06/27/2011, 10:47 AM

## 2011-06-27 NOTE — Progress Notes (Signed)
51 Days Post-Op  Subjective: Still mild upper abdominal pain. No nausea. On a solid diet but not taking much po yet. Having BM's. Overall she feels she is improving  Objective: Vital signs in last 24 hours: Temp:  [97.4 F (36.3 C)-99.4 F (37.4 C)] 99.4 F (37.4 C) (12/29 0700) Pulse Rate:  [77-100] 100  (12/29 0957) Resp:  [16-24] 16  (12/29 0700) BP: (120-156)/(60-93) 140/70 mmHg (12/29 0957) SpO2:  [91 %-100 %] 96 % (12/29 0500) Weight:  [159 lb 13.3 oz (72.5 kg)] 159 lb 13.3 oz (72.5 kg) (12/29 0500) Last BM Date: 06/26/11  Intake/Output from previous day: 12/28 0701 - 12/29 0700 In: 320 [P.O.:120; I.V.:200] Out: 2902 [Urine:2900; Drains:2] Intake/Output this shift:    General appearance: alert, no distress and thin GI: Soft and benign. Drain sites all look OK. Not much out drain  Lab Results:   Basename 06/27/11 0500 06/26/11 0545  WBC 7.6 7.4  HGB 11.1* 9.9*  HCT 34.5* 31.0*  PLT 302 295   BMET  Basename 06/27/11 0500 06/25/11 0540  NA 130* 129*  K 4.0 3.9  CL 95* 96  CO2 26 26  GLUCOSE 130* 184*  BUN 14 15  CREATININE 0.47* 0.44*  CALCIUM 9.5 8.7   PT/INR No results found for this basename: LABPROT:2,INR:2 in the last 72 hours ABG No results found for this basename: PHART:2,PCO2:2,PO2:2,HCO3:2 in the last 72 hours  Studies/Results: Ct Abdomen Pelvis W Contrast  06/26/2011  *RADIOLOGY REPORT*  Clinical Data: Intra-abdominal abscess  CT ABDOMEN AND PELVIS WITH CONTRAST  Technique:  Multidetector CT imaging of the abdomen and pelvis was performed following the standard protocol during bolus administration of intravenous contrast.  Contrast: 80mL OMNIPAQUE IOHEXOL 300 MG/ML IV SOLN  Comparison: 06/15/2011  Findings: Trace bilateral pleural effusions with associated lower lobe atelectasis.  Mild cardiomegaly with a small pericardial effusion.  Coronary atherosclerosis.  Liver, spleen, and adrenal glands are within normal limits.  Pancreas is notable for a  subcentimeter hypodense lesion in the pancreatic tail (series 2/image 30).  Stable hypodense bilateral renal lesions.  No hydronephrosis.  Gastrostomy tube.  Status post Billroth II.  No evidence of bowel obstruction.  Normal appendix.  Colonic diverticulosis, without associated inflammatory changes.  Atherosclerotic calcifications of the abdominal aorta and branch vessels.  Near complete resolution of fluid collection anterior to the lateral left hepatic lobe, measuring approximately 3.8 x 0.9 cm (series 2/image 25), with indwelling pigtail drain.  Additional 2.0 x 2.9 cm fluid collection just medial to the left hepatic lobe is improved (series 2/image 31).  No suspicious abdominopelvic lymphadenopathy.  Peroneal dialysis catheter.  Uterus and bilateral ovaries are unremarkable.  Bladder is decompressed by indwelling Foley catheter.  Degenerative changes of the visualized thoracolumbar spine.  Right total hip arthroplasty without evidence of hardware complication.  IMPRESSION: Near complete resolution of fluid collection anterior to the left hepatic level with indwelling pigtail drain, now measuring 3.8 x 0.9 cm.  Additional 2.0 x 2.9 cm fluid collection medial to the left hepatic lobe, improved.  Stable ancillary findings as above.  Original Report Authenticated By: Charline Bills, M.D.    Anti-infectives: Anti-infectives     Start     Dose/Rate Route Frequency Ordered Stop   06/03/11 2115   ciprofloxacin (CIPRO) IVPB 400 mg  Status:  Discontinued     Comments: Please mix with NS.      400 mg 200 mL/hr over 60 Minutes Intravenous Every 12 hours 06/03/11 2114 06/03/11 2121  05/30/11 1130   ciprofloxacin (CIPRO) IVPB 400 mg  Status:  Discontinued        400 mg 200 mL/hr over 60 Minutes Intravenous 2 times daily 05/30/11 1038 06/02/11 1618   05/29/11 2100   imipenem-cilastatin (PRIMAXIN) 500 mg in sodium chloride 0.9 % 100 mL IVPB  Status:  Discontinued        500 mg 200 mL/hr over 30 Minutes  Intravenous Every 8 hours 05/29/11 2007 06/26/11 1702   05/29/11 2100   vancomycin (VANCOCIN) 1,250 mg in sodium chloride 0.9 % 250 mL IVPB  Status:  Discontinued        1,250 mg 166.7 mL/hr over 90 Minutes Intravenous Every 24 hours 05/29/11 2007 06/03/11 1006   05/07/11 1400   imipenem-cilastatin (PRIMAXIN) 500 mg in sodium chloride 0.9 % 100 mL IVPB        500 mg 200 mL/hr over 30 Minutes Intravenous 3 times per day 05/07/11 1106 05/21/11 1359   05/07/11 1200   micafungin (MYCAMINE) 100 mg in sodium chloride 0.9 % 100 mL IVPB  Status:  Discontinued     Comments: PER PHARMACY      100 mg 100 mL/hr over 1 Hours Intravenous Every 24 hours 05/07/11 1032 05/19/11 1103   05/07/11 1200   vancomycin (VANCOCIN) 1,250 mg in sodium chloride 0.9 % 250 mL IVPB  Status:  Discontinued        1,250 mg 166.7 mL/hr over 90 Minutes Intravenous Every 24 hours 05/07/11 1106 05/19/11 1103   05/07/11 1000   micafungin (MYCAMINE) 150 mg in sodium chloride 0.9 % 100 mL IVPB  Status:  Discontinued     Comments: PER PHARMACY      150 mg 100 mL/hr over 1 Hours Intravenous Daily 05/07/11 0951 05/07/11 1029   05/07/11 0300   ertapenem (INVANZ) 1 g in sodium chloride 0.9 % 50 mL IVPB        1 g 100 mL/hr over 30 Minutes Intravenous  Once 05/07/11 0300 05/07/11 0348   05/07/11 0130   cefTRIAXone (ROCEPHIN) 1 g in dextrose 5 % 50 mL IVPB        1 g 100 mL/hr over 30 Minutes Intravenous  Once 05/07/11 0122 05/07/11 0200          Assessment/Plan: s/p Procedure(s): EXPLORATORY LAPAROTOMY GASTROSTOMY GASTRECTOMY CHOLECYSTECTOMY Her CT reviewed and shows continued improvement. Believe she will continue to make progress. Will continue current therapy including TNA until it is clear she can manage with oral  LOS: 52 days    Shaivi Rothschild J 06/27/2011

## 2011-06-27 NOTE — Progress Notes (Signed)
Subjective:  Patient seen and examined, complaining of abdominal pain on and off. No nausea or vomiting.  Objective: Vital signs in last 24 hours: Temp:  [97.4 F (36.3 C)-99.4 F (37.4 C)] 98.8 F (37.1 C) (12/29 1517) Pulse Rate:  [77-100] 92  (12/29 1517) Resp:  [16-24] 16  (12/29 0700) BP: (120-153)/(60-93) 135/79 mmHg (12/29 1517) SpO2:  [91 %-99 %] 95 % (12/29 1517) Weight:  [72.5 kg (159 lb 13.3 oz)] 159 lb 13.3 oz (72.5 kg) (12/29 0500) Weight change: -2 kg (-4 lb 6.5 oz) Last BM Date: 06/26/11  Intake/Output from previous day: 12/28 0701 - 12/29 0700 In: 320 [P.O.:120; I.V.:200] Out: 2902 [Urine:2900; Drains:2] Total I/O In: 40 [P.O.:40] Out: 475 [Urine:475]   Physical Exam: General: Alert, awake, in no acute distress.  Heart: Regular rate and rhythm, without murmurs  Lungs: Clear to auscultation bilaterally.  Abdomen: Soft, mildly tender, nondistended, positive bowel sounds. Dressing and drains in place.  Extremities: No clubbing cyanosis or edema with positive pedal pulses.  Neuro: Grossly intact, nonfocal     Lab Results: Results for orders placed during the hospital encounter of 05/06/11 (from the past 24 hour(s))  GLUCOSE, CAPILLARY     Status: Abnormal   Collection Time   06/26/11  5:13 PM      Component Value Range   Glucose-Capillary 140 (*) 70 - 99 (mg/dL)   Comment 1 Notify RN    GLUCOSE, CAPILLARY     Status: Abnormal   Collection Time   06/26/11  7:49 PM      Component Value Range   Glucose-Capillary 141 (*) 70 - 99 (mg/dL)   Comment 1 Notify RN    GLUCOSE, CAPILLARY     Status: Abnormal   Collection Time   06/26/11  8:59 PM      Component Value Range   Glucose-Capillary 136 (*) 70 - 99 (mg/dL)  GLUCOSE, CAPILLARY     Status: Abnormal   Collection Time   06/27/11  1:19 AM      Component Value Range   Glucose-Capillary 127 (*) 70 - 99 (mg/dL)  GLUCOSE, CAPILLARY     Status: Abnormal   Collection Time   06/27/11  4:30 AM   Component Value Range   Glucose-Capillary 136 (*) 70 - 99 (mg/dL)   Comment 1 Notify RN    CBC     Status: Abnormal   Collection Time   06/27/11  5:00 AM      Component Value Range   WBC 7.6  4.0 - 10.5 (K/uL)   RBC 4.05  3.87 - 5.11 (MIL/uL)   Hemoglobin 11.1 (*) 12.0 - 15.0 (g/dL)   HCT 16.1 (*) 09.6 - 46.0 (%)   MCV 85.2  78.0 - 100.0 (fL)   MCH 27.4  26.0 - 34.0 (pg)   MCHC 32.2  30.0 - 36.0 (g/dL)   RDW 04.5 (*) 40.9 - 15.5 (%)   Platelets 302  150 - 400 (K/uL)  BASIC METABOLIC PANEL     Status: Abnormal   Collection Time   06/27/11  5:00 AM      Component Value Range   Sodium 130 (*) 135 - 145 (mEq/L)   Potassium 4.0  3.5 - 5.1 (mEq/L)   Chloride 95 (*) 96 - 112 (mEq/L)   CO2 26  19 - 32 (mEq/L)   Glucose, Bld 130 (*) 70 - 99 (mg/dL)   BUN 14  6 - 23 (mg/dL)   Creatinine, Ser 8.11 (*) 0.50 - 1.10 (  mg/dL)   Calcium 9.5  8.4 - 04.5 (mg/dL)   GFR calc non Af Amer >90  >90 (mL/min)   GFR calc Af Amer >90  >90 (mL/min)  GLUCOSE, CAPILLARY     Status: Abnormal   Collection Time   06/27/11  7:44 AM      Component Value Range   Glucose-Capillary 140 (*) 70 - 99 (mg/dL)  GLUCOSE, CAPILLARY     Status: Abnormal   Collection Time   06/27/11 12:28 PM      Component Value Range   Glucose-Capillary 118 (*) 70 - 99 (mg/dL)    Studies/Results: Ct Abdomen Pelvis W Contrast  06/26/2011  *RADIOLOGY REPORT*  Clinical Data: Intra-abdominal abscess  CT ABDOMEN AND PELVIS WITH CONTRAST  Technique:  Multidetector CT imaging of the abdomen and pelvis was performed following the standard protocol during bolus administration of intravenous contrast.  Contrast: 80mL OMNIPAQUE IOHEXOL 300 MG/ML IV SOLN  Comparison: 06/15/2011  Findings: Trace bilateral pleural effusions with associated lower lobe atelectasis.  Mild cardiomegaly with a small pericardial effusion.  Coronary atherosclerosis.  Liver, spleen, and adrenal glands are within normal limits.  Pancreas is notable for a subcentimeter  hypodense lesion in the pancreatic tail (series 2/image 30).  Stable hypodense bilateral renal lesions.  No hydronephrosis.  Gastrostomy tube.  Status post Billroth II.  No evidence of bowel obstruction.  Normal appendix.  Colonic diverticulosis, without associated inflammatory changes.  Atherosclerotic calcifications of the abdominal aorta and branch vessels.  Near complete resolution of fluid collection anterior to the lateral left hepatic lobe, measuring approximately 3.8 x 0.9 cm (series 2/image 25), with indwelling pigtail drain.  Additional 2.0 x 2.9 cm fluid collection just medial to the left hepatic lobe is improved (series 2/image 31).  No suspicious abdominopelvic lymphadenopathy.  Peroneal dialysis catheter.  Uterus and bilateral ovaries are unremarkable.  Bladder is decompressed by indwelling Foley catheter.  Degenerative changes of the visualized thoracolumbar spine.  Right total hip arthroplasty without evidence of hardware complication.  IMPRESSION: Near complete resolution of fluid collection anterior to the left hepatic level with indwelling pigtail drain, now measuring 3.8 x 0.9 cm.  Additional 2.0 x 2.9 cm fluid collection medial to the left hepatic lobe, improved.  Stable ancillary findings as above.  Original Report Authenticated By: Charline Bills, M.D.    Medications:    . cloNIDine  0.1 mg Transdermal Weekly  . feeding supplement  237 mL Oral TID WC  . feeding supplement  1 Container Oral TID WC  . insulin aspart  0-9 Units Subcutaneous Q4H  . lip balm  1 application Topical BID  . metoprolol tartrate  12.5 mg Oral BID  . pantoprazole  40 mg Oral BID AC  . DISCONTD: imipenem-cilastatin  500 mg Intravenous Q8H    acetaminophen, hydrALAZINE, LORazepam, morphine injection, ondansetron (ZOFRAN) IV, oxyCODONE, phenol, sodium chloride     . sodium chloride 20 mL/hr (06/27/11 1347)  . fat emulsion 240 mL (06/26/11 1808)  . TPN (CLINIMIX) +/- additives 80 mL/hr at 06/26/11  0523  . TPN (CLINIMIX) +/- additives    . TPN (CLINIMIX) +/- additives 80 mL/hr at 06/26/11 1807    Assessment/Plan:   1. Intra-abdominal abscess  Perc drain per IR 12/02  repeat CT showed improvement as above   Meropenem was discontinued (completed 28days)  Clinically improving overall however have intermittent abdominal pain as above surgery service to address, continue pain control. 2. Perforated peptic ulcer  S/p Ex-Lap with Lysis of adhesions, Antrectomy  with duodenal resect. Billroth 2 reconstruction, omental patching of Duodenal bulb, Open Cholecystectomy, Gastrostomy and Jejunostomy on 11/08  3. Esophagitis, erosive  - continue protonix  4. DM: Stable on SSI  5. Malnutrition  on TNA, diet advance as per [facility]  6. Hyponatremia: chronic, stable  7. Deconditioning/ Disposition: Continue PT/OT .plans for SNF on discharge       LOS: 52 days   [name] 06/27/2011, 4:32 PM

## 2011-06-27 NOTE — Significant Event (Signed)
Patient found by RN sitting on floor beside bed. Patient unable to give account of event. States shoulder pain.  Physical exam: Filed Vitals:   06/26/11 2354  BP: 137/80  Pulse: 79  Temp: 99.4 F (37.4 C)  Resp: 22  General: resting quietly, no acute distress HEENT:  WNL Lungs: clear to auscultation bilaterally Heart regular rate and rhythm Abd: soft, dressing dry and intact Ext : without edema, cyanosis Neuro: disoriented to place, situation Integument: no bruising or abrasions apparent  Impression/Plan: Unwitnessed fall: no apparent injury, vital signs stable, patient in no distress. Continue to monitor.

## 2011-06-28 LAB — GLUCOSE, CAPILLARY
Glucose-Capillary: 118 mg/dL — ABNORMAL HIGH (ref 70–99)
Glucose-Capillary: 126 mg/dL — ABNORMAL HIGH (ref 70–99)
Glucose-Capillary: 146 mg/dL — ABNORMAL HIGH (ref 70–99)
Glucose-Capillary: 146 mg/dL — ABNORMAL HIGH (ref 70–99)

## 2011-06-28 LAB — CBC
HCT: 30.2 % — ABNORMAL LOW (ref 36.0–46.0)
Hemoglobin: 9.9 g/dL — ABNORMAL LOW (ref 12.0–15.0)
MCH: 27.9 pg (ref 26.0–34.0)
MCHC: 32.8 g/dL (ref 30.0–36.0)
MCV: 85.1 fL (ref 78.0–100.0)
RDW: 16.5 % — ABNORMAL HIGH (ref 11.5–15.5)

## 2011-06-28 MED ORDER — INSULIN REGULAR HUMAN 100 UNIT/ML IJ SOLN
INTRAVENOUS | Status: AC
  Administered 2011-06-28: 17:00:00 via INTRAVENOUS
  Filled 2011-06-28: qty 2000

## 2011-06-28 NOTE — Progress Notes (Signed)
Subjective:  Patient seen and examined ,c/o mild abdominal pain,not eating well as per sitter   Objective: Vital signs in last 24 hours: Temp:  [98.8 F (37.1 C)-100.2 F (37.9 C)] 98.8 F (37.1 C) (12/30 0542) Pulse Rate:  [83-92] 83  (12/30 0542) Resp:  [16-20] 20  (12/30 0542) BP: (120-135)/(70-79) 126/70 mmHg (12/30 0542) SpO2:  [95 %-100 %] 100 % (12/30 0542) Weight:  [72.4 kg (159 lb 9.8 oz)] 159 lb 9.8 oz (72.4 kg) (12/30 0542) Weight change: -0.1 kg (-3.5 oz) Last BM Date: 06/26/11  Intake/Output from previous day: 12/29 0701 - 12/30 0700 In: 70 [P.O.:70] Out: 2125 [Urine:2125]     Physical Exam: General: Alert, awake, in no acute distress.  Heart: Regular rate and rhythm, without murmurs  Lungs: Clear to auscultation bilaterally.  Abdomen: Soft, mildly tender, nondistended, positive bowel sounds. Dressing and drains in place.  Extremities: No clubbing cyanosis or edema with positive pedal pulses.  Neuro: Grossly intact, nonfocal     Lab Results: Results for orders placed during the hospital encounter of 05/06/11 (from the past 24 hour(s))  GLUCOSE, CAPILLARY     Status: Abnormal   Collection Time   06/27/11  5:07 PM      Component Value Range   Glucose-Capillary 128 (*) 70 - 99 (mg/dL)  GLUCOSE, CAPILLARY     Status: Abnormal   Collection Time   06/27/11  7:45 PM      Component Value Range   Glucose-Capillary 156 (*) 70 - 99 (mg/dL)   Comment 1 Notify RN    GLUCOSE, CAPILLARY     Status: Abnormal   Collection Time   06/27/11 11:47 PM      Component Value Range   Glucose-Capillary 104 (*) 70 - 99 (mg/dL)   Comment 1 Notify RN    GLUCOSE, CAPILLARY     Status: Abnormal   Collection Time   06/28/11  4:04 AM      Component Value Range   Glucose-Capillary 118 (*) 70 - 99 (mg/dL)   Comment 1 Notify RN    CBC     Status: Abnormal   Collection Time   06/28/11  5:00 AM      Component Value Range   WBC 6.4  4.0 - 10.5 (K/uL)   RBC 3.55 (*) 3.87 - 5.11  (MIL/uL)   Hemoglobin 9.9 (*) 12.0 - 15.0 (g/dL)   HCT 40.9 (*) 81.1 - 46.0 (%)   MCV 85.1  78.0 - 100.0 (fL)   MCH 27.9  26.0 - 34.0 (pg)   MCHC 32.8  30.0 - 36.0 (g/dL)   RDW 91.4 (*) 78.2 - 15.5 (%)   Platelets 252  150 - 400 (K/uL)  GLUCOSE, CAPILLARY     Status: Abnormal   Collection Time   06/28/11  8:01 AM      Component Value Range   Glucose-Capillary 126 (*) 70 - 99 (mg/dL)  GLUCOSE, CAPILLARY     Status: Abnormal   Collection Time   06/28/11 12:22 PM      Component Value Range   Glucose-Capillary 146 (*) 70 - 99 (mg/dL)    Studies/Results: No results found.  Medications:    . cloNIDine  0.1 mg Transdermal Weekly  . feeding supplement  237 mL Oral TID WC  . feeding supplement  1 Container Oral TID WC  . insulin aspart  0-9 Units Subcutaneous Q4H  . lip balm  1 application Topical BID  . metoprolol tartrate  12.5 mg Oral  BID  . pantoprazole  40 mg Oral BID AC    acetaminophen, hydrALAZINE, LORazepam, morphine injection, ondansetron (ZOFRAN) IV, oxyCODONE, phenol, sodium chloride     . sodium chloride 20 mL/hr at 06/28/11 0600  . fat emulsion 240 mL (06/26/11 1808)  . TPN (CLINIMIX) +/- additives 80 mL/hr at 06/27/11 1745  . TPN (CLINIMIX) +/- additives    . TPN (CLINIMIX) +/- additives 80 mL/hr at 06/26/11 1807    Assessment/Plan: 1. Intra-abdominal abscess  Perc drain per IR 12/02  repeat CT showed improvement  Meropenem was discontinued (completed 28days) . continue pain control.  2. Perforated peptic ulcer  S/p Ex-Lap with Lysis of adhesions, Antrectomy with duodenal resect. Billroth 2 reconstruction, omental patching of Duodenal bulb, Open Cholecystectomy, Gastrostomy and Jejunostomy on 11/08  3. Esophagitis, erosive  - continue protonix  4. DM: Stable on SSI  5. Malnutrition  on TNA, not tolerating po very well  6. Hyponatremia: chronic, stable  7. Deconditioning/ Disposition: Continue PT/OT .plans for SNF on discharge         LOS: 53  days   Terrina Docter 06/28/2011, 2:45 PM

## 2011-06-28 NOTE — Progress Notes (Signed)
Patient ID: [name], female   DOB: 1932/01/26, 77 y.o.   MRN: [id number] S: She voices no new c/o or problems. Still with mild abdominal pain, but this has been stable and no acute change. She has not taken much in PO and doesn't feel as well today as yesterday.  Exam: BP 126/70  Pulse 83  Temp(Src) 98.8 F (37.1 C) (Oral)  Resp 20  Ht 5\' 6"  (1.676 m)  Wt 159 lb 9.8 oz (72.4 kg)  BMI 25.76 kg/m2  SpO2 100%  Gen: alert, NAD, appears tired Abd: Soft and basically benign. No significant change in drains.  Imp: S/Q  Plan: No change today. Will need to discuss with radiology plans for drains. If she doesn't take more in PO then we may need to consider tube feeds. She has a gastrostomy tube available.

## 2011-06-29 LAB — GLUCOSE, CAPILLARY
Glucose-Capillary: 125 mg/dL — ABNORMAL HIGH (ref 70–99)
Glucose-Capillary: 133 mg/dL — ABNORMAL HIGH (ref 70–99)
Glucose-Capillary: 156 mg/dL — ABNORMAL HIGH (ref 70–99)

## 2011-06-29 LAB — COMPREHENSIVE METABOLIC PANEL
AST: 18 U/L (ref 0–37)
Albumin: 2.1 g/dL — ABNORMAL LOW (ref 3.5–5.2)
Alkaline Phosphatase: 81 U/L (ref 39–117)
BUN: 18 mg/dL (ref 6–23)
Potassium: 3.9 mEq/L (ref 3.5–5.1)
Total Protein: 6.2 g/dL (ref 6.0–8.3)

## 2011-06-29 LAB — CBC
HCT: 29.6 % — ABNORMAL LOW (ref 36.0–46.0)
MCHC: 32.1 g/dL (ref 30.0–36.0)
Platelets: 254 10*3/uL (ref 150–400)
RDW: 16.5 % — ABNORMAL HIGH (ref 11.5–15.5)

## 2011-06-29 LAB — DIFFERENTIAL
Eosinophils Absolute: 0.2 10*3/uL (ref 0.0–0.7)
Eosinophils Relative: 2 % (ref 0–5)
Lymphocytes Relative: 41 % (ref 12–46)
Lymphs Abs: 2.6 10*3/uL (ref 0.7–4.0)
Monocytes Relative: 8 % (ref 3–12)
Neutrophils Relative %: 49 % (ref 43–77)

## 2011-06-29 LAB — CHOLESTEROL, TOTAL: Cholesterol: 125 mg/dL (ref 0–200)

## 2011-06-29 LAB — PHOSPHORUS: Phosphorus: 5.3 mg/dL — ABNORMAL HIGH (ref 2.3–4.6)

## 2011-06-29 LAB — MAGNESIUM: Magnesium: 1.6 mg/dL (ref 1.5–2.5)

## 2011-06-29 MED ORDER — TRACE MINERALS CR-CU-MN-SE-ZN 10-1000-500-60 MCG/ML IV SOLN
INTRAVENOUS | Status: AC
  Administered 2011-06-29: 18:00:00 via INTRAVENOUS
  Filled 2011-06-29: qty 2000

## 2011-06-29 MED ORDER — FAT EMULSION 20 % IV EMUL
240.0000 mL | INTRAVENOUS | Status: AC
  Administered 2011-06-29: 240 mL via INTRAVENOUS
  Filled 2011-06-29: qty 250

## 2011-06-29 NOTE — Progress Notes (Signed)
Subjective: Patient seen and examined ,c/o abdominal pain   Objective: Vital signs in last 24 hours: Temp:  [98.4 F (36.9 C)-99.5 F (37.5 C)] 99.5 F (37.5 C) (12/31 1300) Pulse Rate:  [85-98] 98  (12/31 1300) Resp:  [18-20] 18  (12/31 1300) BP: (108-126)/(66-79) 126/70 mmHg (12/31 1300) SpO2:  [95 %-96 %] 95 % (12/31 1300) Weight:  [72.9 kg (160 lb 11.5 oz)] 160 lb 11.5 oz (72.9 kg) (12/31 0506) Weight change: 0.5 kg (1 lb 1.6 oz) Last BM Date: 06/26/11  Intake/Output from previous day: 12/30 0701 - 12/31 0700 In: 60 [P.O.:60] Out: 1928 [Urine:1900; Drains:27; Stool:1] Total I/O In: 240 [P.O.:240] Out: 325 [Urine:325]   Physical Exam:  General: Alert, awake, in no acute distress.  Heart: Regular rate and rhythm, without murmurs  Lungs: Clear to auscultation bilaterally.  Abdomen: Soft, mildly tender, nondistended, positive bowel sounds. Dressing and drains in place.  Extremities: No clubbing cyanosis or edema with positive pedal pulses.  Neuro: Grossly intact, nonfocal    Lab Results: Results for orders placed during the hospital encounter of 05/06/11 (from the past 24 hour(s))  GLUCOSE, CAPILLARY     Status: Abnormal   Collection Time   06/28/11  7:44 PM      Component Value Range   Glucose-Capillary 146 (*) 70 - 99 (mg/dL)   Comment 1 Notify RN    GLUCOSE, CAPILLARY     Status: Abnormal   Collection Time   06/28/11 11:49 PM      Component Value Range   Glucose-Capillary 124 (*) 70 - 99 (mg/dL)   Comment 1 Notify RN    GLUCOSE, CAPILLARY     Status: Abnormal   Collection Time   06/29/11  3:57 AM      Component Value Range   Glucose-Capillary 135 (*) 70 - 99 (mg/dL)   Comment 1 Notify RN    CBC     Status: Abnormal   Collection Time   06/29/11  5:00 AM      Component Value Range   WBC 6.4  4.0 - 10.5 (K/uL)   RBC 3.46 (*) 3.87 - 5.11 (MIL/uL)   Hemoglobin 9.5 (*) 12.0 - 15.0 (g/dL)   HCT 84.6 (*) 96.2 - 46.0 (%)   MCV 85.5  78.0 - 100.0 (fL)   MCH  27.5  26.0 - 34.0 (pg)   MCHC 32.1  30.0 - 36.0 (g/dL)   RDW 95.2 (*) 84.1 - 15.5 (%)   Platelets 254  150 - 400 (K/uL)  COMPREHENSIVE METABOLIC PANEL     Status: Abnormal   Collection Time   06/29/11  5:00 AM      Component Value Range   Sodium 133 (*) 135 - 145 (mEq/L)   Potassium 3.9  3.5 - 5.1 (mEq/L)   Chloride 99  96 - 112 (mEq/L)   CO2 26  19 - 32 (mEq/L)   Glucose, Bld 126 (*) 70 - 99 (mg/dL)   BUN 18  6 - 23 (mg/dL)   Creatinine, Ser 3.24  0.50 - 1.10 (mg/dL)   Calcium 8.7  8.4 - 40.1 (mg/dL)   Total Protein 6.2  6.0 - 8.3 (g/dL)   Albumin 2.1 (*) 3.5 - 5.2 (g/dL)   AST 18  0 - 37 (U/L)   ALT 19  0 - 35 (U/L)   Alkaline Phosphatase 81  39 - 117 (U/L)   Total Bilirubin 0.3  0.3 - 1.2 (mg/dL)   GFR calc non Af Amer 89 (*) >90 (  mL/min)   GFR calc Af Amer >90  >90 (mL/min)  MAGNESIUM     Status: Normal   Collection Time   06/29/11  5:00 AM      Component Value Range   Magnesium 1.6  1.5 - 2.5 (mg/dL)  DIFFERENTIAL     Status: Normal   Collection Time   06/29/11  5:00 AM      Component Value Range   Neutrophils Relative 49  43 - 77 (%)   Neutro Abs 3.1  1.7 - 7.7 (K/uL)   Lymphocytes Relative 41  12 - 46 (%)   Lymphs Abs 2.6  0.7 - 4.0 (K/uL)   Monocytes Relative 8  3 - 12 (%)   Monocytes Absolute 0.5  0.1 - 1.0 (K/uL)   Eosinophils Relative 2  0 - 5 (%)   Eosinophils Absolute 0.2  0.0 - 0.7 (K/uL)   Basophils Relative 0  0 - 1 (%)   Basophils Absolute 0.0  0.0 - 0.1 (K/uL)  PREALBUMIN     Status: Abnormal   Collection Time   06/29/11  5:00 AM      Component Value Range   Prealbumin 15.0 (*) 17.0 - 34.0 (mg/dL)  PHOSPHORUS     Status: Abnormal   Collection Time   06/29/11  5:00 AM      Component Value Range   Phosphorus 5.3 (*) 2.3 - 4.6 (mg/dL)  CHOLESTEROL, TOTAL     Status: Normal   Collection Time   06/29/11  5:00 AM      Component Value Range   Cholesterol 125  0 - 200 (mg/dL)  TRIGLYCERIDES     Status: Normal   Collection Time   06/29/11  5:00 AM        Component Value Range   Triglycerides 128  <150 (mg/dL)  GLUCOSE, CAPILLARY     Status: Abnormal   Collection Time   06/29/11  8:10 AM      Component Value Range   Glucose-Capillary 132 (*) 70 - 99 (mg/dL)   Comment 1 Notify RN    GLUCOSE, CAPILLARY     Status: Abnormal   Collection Time   06/29/11 11:54 AM      Component Value Range   Glucose-Capillary 125 (*) 70 - 99 (mg/dL)   Comment 1 Documented in Chart     Comment 2 Notify RN    GLUCOSE, CAPILLARY     Status: Abnormal   Collection Time   06/29/11  4:26 PM      Component Value Range   Glucose-Capillary 133 (*) 70 - 99 (mg/dL)    Studies/Results: No results found.  Medications:    . cloNIDine  0.1 mg Transdermal Weekly  . feeding supplement  237 mL Oral TID WC  . feeding supplement  1 Container Oral TID WC  . insulin aspart  0-9 Units Subcutaneous Q4H  . lip balm  1 application Topical BID  . metoprolol tartrate  12.5 mg Oral BID  . pantoprazole  40 mg Oral BID AC    acetaminophen, hydrALAZINE, LORazepam, morphine injection, ondansetron (ZOFRAN) IV, oxyCODONE, phenol, sodium chloride     . sodium chloride 20 mL/hr at 06/28/11 1715  . fat emulsion    . TPN (CLINIMIX) +/- additives    . TPN (CLINIMIX) +/- additives 80 mL/hr at 06/27/11 1745  . TPN (CLINIMIX) +/- additives 80 mL/hr at 06/28/11 1715    Assessment/Plan:  1. Intra-abdominal abscess  Perc drain was,removed today by IR  repeat CT showed improvement   continue pain control.  2. Perforated peptic ulcer  S/p Ex-Lap with Lysis of adhesions, Antrectomy with duodenal resect. Billroth 2 reconstruction, omental patching of Duodenal bulb, Open Cholecystectomy, Gastrostomy and Jejunostomy on 11/08  3. Esophagitis, erosive  - continue protonix  4. DM: Stable on SSI  5. Malnutrition  on TNA, poor po intake noted   6. Hyponatremia: chronic, stable  7. Deconditioning/ Disposition: Continue PT/OT .plans for SNF on discharge        LOS: 54 days    [name] 06/29/2011, 5:49 PM

## 2011-06-29 NOTE — Progress Notes (Signed)
53 Days Post-Op  Subjective: Patient without acute changes  Objective: Vital signs in last 24 hours: Temp:  [98.4 F (36.9 C)-100.5 F (38.1 C)] 98.4 F (36.9 C) (12/31 0506) Pulse Rate:  [85-100] 93  (12/31 1012) Resp:  [18-20] 18  (12/31 0506) BP: (108-124)/(66-79) 108/66 mmHg (12/31 1012) SpO2:  [94 %-96 %] 95 % (12/31 0506) Weight:  [160 lb 11.5 oz (72.9 kg)] 160 lb 11.5 oz (72.9 kg) (12/31 0506) Last BM Date: 06/26/11  Intake/Output from previous day: 12/30 0701 - 12/31 0700 In: 60 [P.O.:60] Out: 1928 [Urine:1900; Drains:27; Stool:1] Intake/Output this shift:    Epigastric drain intact, insertion site ok, mildly tender; case d/w Drs. Wyatt/Hoss and decision made to remove drain; drain removed in its entirety without immediate complications and gauze dressing applied to site.  Lab Results:   Basename 06/29/11 0500 06/28/11 0500  WBC 6.4 6.4  HGB 9.5* 9.9*  HCT 29.6* 30.2*  PLT 254 252   BMET  Basename 06/29/11 0500 06/27/11 0500  NA 133* 130*  K 3.9 4.0  CL 99 95*  CO2 26 26  GLUCOSE 126* 130*  BUN 18 14  CREATININE 0.54 0.47*  CALCIUM 8.7 9.5   PT/INR No results found for this basename: LABPROT:2,INR:2 in the last 72 hours ABG No results found for this basename: PHART:2,PCO2:2,PO2:2,HCO3:2 in the last 72 hours  Studies/Results: No results found.  Anti-infectives: Anti-infectives     Start     Dose/Rate Route Frequency Ordered Stop   06/03/11 2115   ciprofloxacin (CIPRO) IVPB 400 mg  Status:  Discontinued     Comments: Please mix with NS.      400 mg 200 mL/hr over 60 Minutes Intravenous Every 12 hours 06/03/11 2114 06/03/11 2121   05/30/11 1130   ciprofloxacin (CIPRO) IVPB 400 mg  Status:  Discontinued        400 mg 200 mL/hr over 60 Minutes Intravenous 2 times daily 05/30/11 1038 06/02/11 1618   05/29/11 2100   imipenem-cilastatin (PRIMAXIN) 500 mg in sodium chloride 0.9 % 100 mL IVPB  Status:  Discontinued        500 mg 200 mL/hr over 30  Minutes Intravenous Every 8 hours 05/29/11 2007 06/26/11 1702   05/29/11 2100   vancomycin (VANCOCIN) 1,250 mg in sodium chloride 0.9 % 250 mL IVPB  Status:  Discontinued        1,250 mg 166.7 mL/hr over 90 Minutes Intravenous Every 24 hours 05/29/11 2007 06/03/11 1006   05/07/11 1400   imipenem-cilastatin (PRIMAXIN) 500 mg in sodium chloride 0.9 % 100 mL IVPB        500 mg 200 mL/hr over 30 Minutes Intravenous 3 times per day 05/07/11 1106 05/21/11 1359   05/07/11 1200   micafungin (MYCAMINE) 100 mg in sodium chloride 0.9 % 100 mL IVPB  Status:  Discontinued     Comments: PER PHARMACY      100 mg 100 mL/hr over 1 Hours Intravenous Every 24 hours 05/07/11 1032 05/19/11 1103   05/07/11 1200   vancomycin (VANCOCIN) 1,250 mg in sodium chloride 0.9 % 250 mL IVPB  Status:  Discontinued        1,250 mg 166.7 mL/hr over 90 Minutes Intravenous Every 24 hours 05/07/11 1106 05/19/11 1103   05/07/11 1000   micafungin (MYCAMINE) 150 mg in sodium chloride 0.9 % 100 mL IVPB  Status:  Discontinued     Comments: PER PHARMACY      150 mg 100 mL/hr over 1 Hours  Intravenous Daily 05/07/11 0951 05/07/11 1029   05/07/11 0300   ertapenem (INVANZ) 1 g in sodium chloride 0.9 % 50 mL IVPB        1 g 100 mL/hr over 30 Minutes Intravenous  Once 05/07/11 0300 05/07/11 0348   05/07/11 0130   cefTRIAXone (ROCEPHIN) 1 g in dextrose 5 % 50 mL IVPB        1 g 100 mL/hr over 30 Minutes Intravenous  Once 05/07/11 0122 05/07/11 0200          Assessment/Plan: s/p epigastric abscess drainage 12/2; 55F (IR placed) drain removed today following discussion with Dr. Nicholas Lose).      LOS: 54 days    ALLRED,D [facility] 06/29/2011

## 2011-06-29 NOTE — Progress Notes (Signed)
Patient ID: [name], female   DOB: Jan 26, 1932, 77 y.o.   MRN: [id number] S: She voices no new c/o or problems. Still with mild abdominal pain, but this has been stable and no acute change. She had some leg pain earlier, but none now Still  has not taken much in PO and doesn't feel as well today as yesterday.  Exam: BP 121/74  Pulse 91  Temp(Src) 98.4 F (36.9 C) (Oral)  Resp 18  Ht 5\' 6"  (1.676 m)  Wt 160 lb 11.5 oz (72.9 kg)  BMI 25.94 kg/m2  SpO2 95%  Gen: alert, NAD, appears tired Abd: Soft and basically benign. No significant change in drains.  Imp: S/Q  Plan: No change today. Will need to discuss with radiology plans for drains. If she doesn't take more in PO then we may need to consider tube feeds. She has a gastrostomy tube available.

## 2011-06-29 NOTE — Progress Notes (Signed)
Occupational TherapyTreatment [name] MRN: [id number] DOB: [birth year] Today's Date: 06/29/2011  OT Assessment/Plan OT Assessment/Plan Comments on Treatment Session: Pt. requires maximal cuing to participate.   OT Plan: Discharge plan remains appropriate OT Frequency: Min 1X/week Follow Up Recommendations: Skilled nursing facility Equipment Recommended: Defer to next venue OT Goals ADL Goals ADL Goal: Grooming - Progress: Progressing toward goals ADL Goal: Toilet Transfer - Progress: Progressing toward goals ADL Goal: Additional Goal #1 - Progress: Progressing toward goals  OT Treatment Precautions/Restrictions  Precautions Precautions: Fall Precaution Comments: Weakness. Pt also has 1 JP drains on Rt. side.  Restrictions Weight Bearing Restrictions: No   ADL ADL Grooming: Performed;Wash/dry face Where Assessed - Grooming: Sitting, chair;Supported Statistician: +2 Total assistance;Comment for patient % (Pt. ~50% ) Toilet Transfer Details (indicate cue type and reason): Pt. transferred bed to chair.  Sit to stand with modA with UE support, and transferred with total A +2 (pt. 50%)  Pt. with suffling steps, and flexion of trunk. Toilet Transfer Method: Surveyor, minerals: Programme researcher, broadcasting/film/video Manipulation: +1 Total assistance;Simulated Where Assessed - Toileting Clothing Manipulation: Standing ADL Comments: Pt. required maximal cuing to participate in OT. Frequently states "I don't feel good", "I don't want to"  Pt. transferred supine to sit with mod A and maximal encouragement Mobility  Bed Mobility Bed Mobility: Yes Rolling Right: With rail;5: Supervision Rolling Left: 4: Min assist;With rail Transfers Transfers: Yes Sit to Stand: 3: Mod assist (x 2) Stand to Sit: To chair/3-in-1;1: +2 Total assist (Pt. ~50%) Exercises    End of Session OT - End of Session Activity Tolerance: Patient limited by  fatigue;Patient limited by pain Patient left: in chair;with call bell in reach (sitter present) Nurse Communication: Mobility status for transfers General Behavior During Session: Lethargic Cognition: Impaired Cognitive Impairment: Decreased memory and requesting return to supine ~ every 5 minutes, decreased ability to understand need for increased mobility.   [name]  06/29/2011, 5:36 PM

## 2011-06-29 NOTE — Progress Notes (Signed)
PARENTERAL NUTRITION CONSULT NOTE - FOLLOW UP  Pharmacy Consult for TPN Indication: TF intolerance  Patient Measurements: Height: 5\' 6"  (167.6 cm) Weight: 160 lb 11.5 oz (72.9 kg) IBW/kg (Calculated) : 59.3   Vital Signs: Temp: 98.4 F (36.9 C) (12/31 0506) Temp src: Oral (12/31 0506) BP: 121/74 mmHg (12/31 0506) Pulse Rate: 91  (12/31 0506) Intake/Output from previous day: 12/30 0701 - 12/31 0700 In: 60 [P.O.:60] Out: 1928 [Urine:1900; Drains:27; Stool:1] Intake/Output from this shift:    Labs:  Basename 06/29/11 0500 06/28/11 0500 06/27/11 0500  WBC 6.4 6.4 7.6  HGB 9.5* 9.9* 11.1*  HCT 29.6* 30.2* 34.5*  PLT 254 252 302  APTT -- -- --  INR -- -- --     Basename 06/29/11 0500 06/27/11 0500  NA 133* 130*  K 3.9 4.0  CL 99 95*  CO2 26 26  GLUCOSE 126* 130*  BUN 18 14  CREATININE 0.54 0.47*  LABCREA -- --  CREAT24HRUR -- --  CALCIUM 8.7 9.5  MG 1.6 --  PHOS 5.3* --  PROT 6.2 --  ALBUMIN 2.1* --  AST 18 --  ALT 19 --  ALKPHOS 81 --  BILITOT 0.3 --  BILIDIR -- --  IBILI -- --  PREALBUMIN -- --  TRIG 128 --  CHOLHDL -- --  CHOL 125 --   Estimated Creatinine Clearance: 60.2 ml/min (by C-G formula based on Cr of 0.54).    Basename 06/29/11 0810 06/29/11 0357 06/28/11 2349  GLUCAP 132* 135* 124*   Insulin Requirements in the past 24 hours:  CBGs 124-146 requiring 6 units of SSI 50 units insulin in TPN  Current Nutrition:  Clinimix E5/15 TPN at 72ml/hr Provides 96gm protein  Assessment: 79 yof previously on TPN (for erosive esphagitis with perforated pyloric ulcer s/p lap). S/p gastrostomy, gastrectomy and cholecystectomy 11/7. TPN initially started 05/12/11-05/13/11. Resumed TPN on 11/20 for increase J-tube output and J-tube displacement. 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 TPN ongoing d/t poor PO intake, changed to continuous 24 hour infusion d/t low Na. Phosphorous is elevated today so will need to remove electrolytes from TPN. Not taking PO well per MD  assessment.   Nutritional Goals:  1875-2000 kCal, 96-110 grams of protein per day  Plan:  1. Clinimix 5/15 at 57ml/hr + trace elements + MVI MWF  2. Lipids at 57ml/hr MWF (d/t backorder) 3. F/u AM BMET, Mg, Phos 4. F/u diet plans  Evan Mackie, Drake Leach 06/29/2011,9:37 AM

## 2011-06-29 NOTE — Progress Notes (Signed)
Physical Therapy Treatment [name] MRN: [id number] DOB: [birth year] Today's Date: 06/29/2011  PT Assessment/Plan  PT - Assessment/Plan Comments on Treatment Session: Pt participating however not very motivated today as she is reporting abdominal pain. Pt is using same verbal complaints as previous sessions however RN made aware.  PT Plan: Discharge plan remains appropriate PT Frequency: Min 3X/week Follow Up Recommendations: Skilled nursing facility Equipment Recommended: Defer to next venue PT Goals  Acute Rehab PT Goals PT Goal Formulation: With patient Time For Goal Achievement: 2 weeks Pt will go Supine/Side to Sit: with modified independence (progressed secondary to met last session) PT Goal: Supine/Side to Sit - Progress: Progressing toward goal Pt will go Sit to Supine/Side: with min assist Pt will Transfer Bed to Chair/Chair to Bed: with min assist (progressed secondary to met last session) PT Transfer Goal: Bed to Chair/Chair to Bed - Progress: Progressing toward goal Pt will Ambulate: 1 - 15 feet;with mod assist;with rolling walker PT Goal: Ambulate - Progress: Progressing toward goal  PT Treatment Precautions/Restrictions  Precautions Precautions: Fall Precaution Comments: Weakness. Pt also has 2 JP drains on Rt. side.  Restrictions Weight Bearing Restrictions: No Mobility (including Balance) Bed Mobility Bed Mobility: Yes Rolling Right: With rail;5: Supervision Rolling Right Details (indicate cue type and reason): Max verbal cues for initiation, use of rail.  Right Sidelying to Sit: 4: Min assist;HOB flat Right Sidelying to Sit Details (indicate cue type and reason): Tactile cues for moving LEs over EOB, pt with decreased motivation at this point. Once pt initiated bringing trunk upright pt required no further assistance.  Supine to Sit: 4: Min assist Sitting - Scoot to Edge of Bed: 5: Supervision Transfers Transfers: Yes Sit to Stand:  4: Min assist;With upper extremity assist;3: Mod assist;From bed Sit to Stand Details (indicate cue type and reason): Min assist first attempt, pt sat back down quickly. Second attempt required moderate assistance.  Stand to Sit: To chair/3-in-1;3: Mod assist;To bed Stand to Sit Details: Moderate assist to control descent.  Stand Pivot Transfers: 1: +2 Total assist;Patient percentage (comment) (Pt = 40%) Stand Pivot Transfer Details (indicate cue type and reason): Pt fatiguing quickly, excessive hip flexion created difficulty in reaching full standing. PT to block pt's feet bilaterally.  First attempted with RW, pt unable. Second attempt just with 2 person assist.  Ambulation/Gait Ambulation/Gait: No (attempted, pt unable to take > 1 step with RW.)    Exercise  General Exercises - Lower Extremity Ankle Circles/Pumps: AROM;Strengthening;Both;15 reps;Supine;Seated (into pillow) Hip Flexion/Marching:  (pt reverts to long arc quads secondary to decr. memory) End of Session PT - End of Session Equipment Utilized During Treatment: Gait belt Activity Tolerance: Patient limited by pain;Patient limited by fatigue Patient left: in chair;with call bell in reach;with family/visitor present (with sitter) Nurse Communication: Mobility status for transfers General Behavior During Session: [facility] for tasks performed Cognition: Impaired (Pt having difficulty focusing on task. Easily distracted) Cognitive Impairment: Decreased memory and requesting return to supine ~ every 5 minutes, decreased ability to understand need for increased mobility.   [name] 06/29/2011, 11:41 AM  [name]) [name] PT, DPT Acute Rehabilitation [phone]

## 2011-06-30 LAB — GLUCOSE, CAPILLARY
Glucose-Capillary: 139 mg/dL — ABNORMAL HIGH (ref 70–99)
Glucose-Capillary: 149 mg/dL — ABNORMAL HIGH (ref 70–99)
Glucose-Capillary: 133 mg/dL — ABNORMAL HIGH (ref 70–99)
Glucose-Capillary: 152 mg/dL — ABNORMAL HIGH (ref 70–99)

## 2011-06-30 LAB — BASIC METABOLIC PANEL
Calcium: 9.3 mg/dL (ref 8.4–10.5)
GFR calc Af Amer: >90 mL/min (ref >90)
GFR calc non Af Amer: >90 mL/min (ref >90)
Potassium: 4 mEq/L (ref 3.5–5.1)
Sodium: 132 mEq/L — ABNORMAL LOW (ref 135–145)

## 2011-06-30 LAB — CBC
Hemoglobin: 11.5 g/dL — ABNORMAL LOW (ref 12.0–15.0)
MCH: 28 pg (ref 26.0–34.0)
MCHC: 32.7 g/dL (ref 30.0–36.0)
Platelets: 244 10*3/uL (ref 150–400)
RDW: 16.3 % — ABNORMAL HIGH (ref 11.5–15.5)

## 2011-06-30 LAB — PHOSPHORUS: Phosphorus: 4.5 mg/dL (ref 2.3–4.6)

## 2011-06-30 LAB — MAGNESIUM: Magnesium: 1.4 mg/dL — ABNORMAL LOW (ref 1.5–2.5)

## 2011-06-30 MED ORDER — MAGNESIUM SULFATE 40 MG/ML IJ SOLN
2.0000 g | Freq: Once | INTRAMUSCULAR | Status: AC
  Administered 2011-06-30: 2 g via INTRAVENOUS
  Filled 2011-06-30: qty 50

## 2011-06-30 MED ORDER — ALTEPLASE 2 MG IJ SOLR: 2.0000 mg | Freq: Once | INTRAMUSCULAR | Status: DC

## 2011-06-30 MED ORDER — M.V.I. ADULT IV INJ
INTRAVENOUS | Status: DC
  Filled 2011-06-30: qty 2000

## 2011-06-30 MED ORDER — ALTEPLASE 2 MG IJ SOLR
4.0000 mg | Freq: Once | INTRAMUSCULAR | Status: AC
  Administered 2011-06-30: 4 mg
  Filled 2011-06-30: qty 4

## 2011-06-30 MED ORDER — CLINIMIX/DEXTROSE (5/15) 5 % IV SOLN
INTRAVENOUS | Status: AC
  Administered 2011-06-30: 18:00:00 via INTRAVENOUS
  Filled 2011-06-30: qty 2000

## 2011-06-30 NOTE — Progress Notes (Signed)
PARENTERAL NUTRITION CONSULT NOTE - FOLLOW UP  Pharmacy Consult for TPN Indication: TF intolerance  Patient Measurements: Height: 5\' 6"  (167.6 cm) Weight: 154 lb 12.2 oz (70.2 kg) (1pillow, 1pad, 1 top sheet, 1blanket) IBW/kg (Calculated) : 59.3   Vital Signs: Temp: 99.4 F (37.4 C) (01/01 0500) Temp src: Oral (01/01 0500) BP: 130/82 mmHg (01/01 0500) Pulse Rate: 90  (01/01 0500) Intake/Output from previous day: 12/31 0701 - 01/01 0700 In: 270 [P.O.:270] Out: 1480 [Urine:1475; Drains:5] Intake/Output from this shift:    Labs:  Basename 06/30/11 1004 06/29/11 0500 06/28/11 0500  WBC 7.8 6.4 6.4  HGB 11.5* 9.5* 9.9*  HCT 35.2* 29.6* 30.2*  PLT 244 254 252  APTT -- -- --  INR -- -- --     Basename 06/30/11 1004 06/29/11 0500  NA 132* 133*  K 4.0 3.9  CL 99 99  CO2 24 26  GLUCOSE 118* 126*  BUN 16 18  CREATININE 0.48* 0.54  LABCREA -- --  CREAT24HRUR -- --  CALCIUM 9.3 8.7  MG 1.4* 1.6  PHOS 4.5 5.3*  PROT -- 6.2  ALBUMIN -- 2.1*  AST -- 18  ALT -- 19  ALKPHOS -- 81  BILITOT -- 0.3  BILIDIR -- --  IBILI -- --  PREALBUMIN -- 15.0*  TRIG -- 128  CHOLHDL -- --  CHOL -- 125   Estimated Creatinine Clearance: 55.1 ml/min (by C-G formula based on Cr of 0.48).    Basename 06/30/11 1150 06/30/11 0756 06/30/11 0455  GLUCAP 149* 131* 139*   Insulin Requirements in the past 24 hours:  CBGs 125-156 requiring 5 units of SSI 50 units insulin in TPN  Current Nutrition:  Clinimix 5/15 TPN at 30ml/hr Provides 96gm protein  Assessment: 79 yof previously on TPN (for erosive esphagitis with perforated pyloric ulcer s/p lap). S/p gastrostomy, gastrectomy and cholecystectomy 11/7. TPN initially started 05/12/11-05/13/11. Resumed TPN on 11/20 for increase J-tube output and J-tube displacement. 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 TPN ongoing d/t poor PO intake, changed to continuous 24 hour infusion d/t low Na. Not taking PO well per MD assessment. Will cont TPN without lytes  today and give 2 gm magnesium bolus  Nutritional Goals:  1875-2000 kCal, 96-110 grams of protein per day  Plan:  1. Clinimix 5/15 at 33ml/hr ( trace elements + MVI MWF ) 2. Magnesium 2gm IV x1 3. F/u AM BMET, Mg, Phos  [name] 06/30/2011,1:39 PM

## 2011-06-30 NOTE — Progress Notes (Signed)
Patient ID: APPHIA CROPLEY, female   DOB: 1931-07-18, 78 y.o.   MRN: [id number] S: She voices no new c/o or problems. Still with mild abdominal pain, but this has been stable and no acute change. Still taking very little PO. Did take some ensure last night, then had diarrhea. C Diff ordered, but no stools since stopping the ensure. Exam: BP 130/82  Pulse 90  Temp(Src) 99.4 F (37.4 C) (Oral)  Resp 20  Ht 5\' 6"  (1.676 m)  Wt 154 lb 12.2 oz (70.2 kg)  BMI 24.98 kg/m2  SpO2 97%  Gen: alert, NAD, appears tired Abd: Soft and basically benign.Drain pulled by radiology yesterday Imp: S/Q  Plan: Will try tube feeds. If she develops a lot of diarrhea with TF we will need to do another UGI. The one from several weeks ago suggested that she has a short distance from her gastro-jejunal anastomosis to her ilio-cecal valve. If she only has a short segment of small bowel available to absorb food, she may need long term TNA.

## 2011-06-30 NOTE — Progress Notes (Signed)
Interim Summary  Active Diagnosis  1. Septic shock -resolved  2. S/p VDRF  3. Pneumoperitoneum due to perforated peptic ulcer  4. S/p Ex-Lap with Lysis of adhesions, Antrectomy with duodenal resection, Billroth 2 reconstruction, omental patching of Duodenal bulb, Open Cholecystectomy, Gastrostomy and Jejunostomy on 11/08  5. Epigastric abscess s/p percutaneous drain per IR 12/02  6. Severe Protein calorie malnutrition  7. Diabetes Mellitus  8. Dementia  9. 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 Hypertension  12. Depression  Consultants  1. CCS  2. PCCM -signed off  3. IR  Procedures  1. 11/08 Emergent Ex-Lap with Lysis of adhesions, Antrectomy with duodenal resection, Billroth 2 reconstruction, omental patching of Duodenal bulb, Open Cholecystectomy, Gastrostomy and Jejunostomy on 11/08 by Dr.Gross  2. 12/02 CT guided percutaneous drain of epigastric abscess per Dr.Schick- IR  Brief Course:  [name] is a 79/f admitted to ICU with Septic shock, respiratory failure and acute pneumoperitoneum / perforated peptic ulcer , subsequently underwent emergent Surgery by Dr.Gross on 11/08 as noted above.  She had a prolong course of acute respiratory failure and was extubated on 11/18, she had been treated with a 14day course of meropenem till 11/22.  Then her course was complicated by development of an intra-abdominal abscess, Meropenem was added back to her regimen on 11/30 and discontinued 12/28  Subsequently she had a percutaneous drain placed by IR on 12/02 and removed 12/31 .   Her last CT 12/28 showed improvement and near complete resolution of abscesses , drain was removed December 28th by IR, diet was advanced per CCS, however she have a poor appetite, she is also on TNA , CCS orders for tube feeding  today. Patient continued to have intermittent abdominal pain. Subjective: Patient seen and examined, complaining of abdominal pain.  Objective: Vital signs in last 24  hours: Temp:  [98.9 F (37.2 C)-99.4 F (37.4 C)] 98.9 F (37.2 C) (01/01 1409) Pulse Rate:  [87-112] 87  (01/01 1409) Resp:  [18-20] 18  (01/01 1409) BP: (130-166)/(76-92) 130/76 mmHg (01/01 1409) SpO2:  [96 %-97 %] 96 % (01/01 1409) Weight:  [70.2 kg (154 lb 12.2 oz)] 154 lb 12.2 oz (70.2 kg) (01/01 0500) Weight change: -2.7 kg (-5 lb 15.2 oz) Last BM Date: 06/29/11  Intake/Output from previous day: 12/31 0701 - 01/01 0700 In: 270 [P.O.:270] Out: 1480 [Urine:1475; Drains:5] Total I/O In: 120 [P.O.:120] Out: 775 [Urine:775]   Physical Exam: General: Alert, awake, in no acute distress.  Heart: Regular rate and rhythm, without murmurs  Lungs: Clear to auscultation bilaterally.  Abdomen: Soft, mildly tender, nondistended, positive bowel sounds. Dressing and PEG in place.  Extremities: No clubbing cyanosis or edema with positive pedal pulses.  Neuro: Grossly intact, nonfocal     Lab Results: Results for orders placed during the hospital encounter of 05/06/11 (from the past 24 hour(s))  GLUCOSE, CAPILLARY     Status: Abnormal   Collection Time   06/29/11  8:30 PM      Component Value Range   Glucose-Capillary 156 (*) 70 - 99 (mg/dL)   Comment 1 Documented in Chart     Comment 2 Notify RN    GLUCOSE, CAPILLARY     Status: Abnormal   Collection Time   06/30/11 12:46 AM      Component Value Range   Glucose-Capillary 131 (*) 70 - 99 (mg/dL)   Comment 1 Notify RN     Comment 2 Documented in Chart    GLUCOSE,  CAPILLARY     Status: Abnormal   Collection Time   06/30/11  4:55 AM      Component Value Range   Glucose-Capillary 139 (*) 70 - 99 (mg/dL)   Comment 1 Notify RN     Comment 2 Documented in Chart    GLUCOSE, CAPILLARY     Status: Abnormal   Collection Time   06/30/11  7:56 AM      Component Value Range   Glucose-Capillary 131 (*) 70 - 99 (mg/dL)   Comment 1 Documented in Chart     Comment 2 Notify RN    CBC     Status: Abnormal   Collection Time   06/30/11 10:04 AM       Component Value Range   WBC 7.8  4.0 - 10.5 (K/uL)   RBC 4.10  3.87 - 5.11 (MIL/uL)   Hemoglobin 11.5 (*) 12.0 - 15.0 (g/dL)   HCT 16.1 (*) 09.6 - 46.0 (%)   MCV 85.9  78.0 - 100.0 (fL)   MCH 28.0  26.0 - 34.0 (pg)   MCHC 32.7  30.0 - 36.0 (g/dL)   RDW 04.5 (*) 40.9 - 15.5 (%)   Platelets 244  150 - 400 (K/uL)  BASIC METABOLIC PANEL     Status: Abnormal   Collection Time   06/30/11 10:04 AM      Component Value Range   Sodium 132 (*) 135 - 145 (mEq/L)   Potassium 4.0  3.5 - 5.1 (mEq/L)   Chloride 99  96 - 112 (mEq/L)   CO2 24  19 - 32 (mEq/L)   Glucose, Bld 118 (*) 70 - 99 (mg/dL)   BUN 16  6 - 23 (mg/dL)   Creatinine, Ser 8.11 (*) 0.50 - 1.10 (mg/dL)   Calcium 9.3  8.4 - 91.4 (mg/dL)   GFR calc non Af Amer >90  >90 (mL/min)   GFR calc Af Amer >90  >90 (mL/min)  PHOSPHORUS     Status: Normal   Collection Time   06/30/11 10:04 AM      Component Value Range   Phosphorus 4.5  2.3 - 4.6 (mg/dL)  MAGNESIUM     Status: Abnormal   Collection Time   06/30/11 10:04 AM      Component Value Range   Magnesium 1.4 (*) 1.5 - 2.5 (mg/dL)  GLUCOSE, CAPILLARY     Status: Abnormal   Collection Time   06/30/11 11:50 AM      Component Value Range   Glucose-Capillary 149 (*) 70 - 99 (mg/dL)   Comment 1 Documented in Chart     Comment 2 Notify RN    GLUCOSE, CAPILLARY     Status: Abnormal   Collection Time   06/30/11  3:55 PM      Component Value Range   Glucose-Capillary 133 (*) 70 - 99 (mg/dL)   Comment 1 Documented in Chart     Comment 2 Notify RN      Studies/Results: No results found.  Medications:    . alteplase  4 mg Intracatheter Once  . cloNIDine  0.1 mg Transdermal Weekly  . feeding supplement  237 mL Oral TID WC  . feeding supplement  1 Container Oral TID WC  . insulin aspart  0-9 Units Subcutaneous Q4H  . lip balm  1 application Topical BID  . magnesium sulfate 1 - 4 g bolus IVPB  2 g Intravenous Once  . metoprolol tartrate  12.5 mg Oral BID  . pantoprazole  40  mg  Oral BID AC  . DISCONTD: alteplase  2 mg Intracatheter Once    acetaminophen, hydrALAZINE, LORazepam, morphine injection, ondansetron (ZOFRAN) IV, oxyCODONE, phenol, sodium chloride     . sodium chloride 20 mL/hr at 06/28/11 1715  . fat emulsion 240 mL (06/29/11 1803)  . TPN (CLINIMIX) +/- additives 80 mL/hr at 06/30/11 1731  . TPN (CLINIMIX) +/- additives 80 mL/hr at 06/29/11 1802  . TPN (CLINIMIX) +/- additives 80 mL/hr at 06/28/11 1715  . DISCONTD: TPN (CLINIMIX) +/- additives      Assessment/Plan:  1. Intra-abdominal abscess  Perc drain was,removedby IR 12/31 repeat CT showed improvement  continue pain control.  2. Perforated peptic ulcer  S/p Ex-Lap with Lysis of adhesions, Antrectomy with duodenal resect. Billroth 2 reconstruction, omental patching of Duodenal bulb, Open Cholecystectomy, Gastrostomy and Jejunostomy on 11/08  3. Esophagitis, erosive  - continue protonix  4. DM: Stable on SSI  5. Malnutrition  on TNA, poor po intake noted ,tube feeding was ordered by CCS 6. Hyponatremia: chronic, stable  7. Deconditioning/ Disposition: Continue PT/OT .plans for SNF on discharge        LOS: 55 days   [name] 06/30/2011, 5:41 PM

## 2011-07-01 LAB — CBC
HCT: 30.4 % — ABNORMAL LOW (ref 36.0–46.0)
MCHC: 31.9 g/dL (ref 30.0–36.0)
MCV: 85.2 fL (ref 78.0–100.0)
Platelets: 244 10*3/uL (ref 150–400)
RBC: 3.57 MIL/uL — ABNORMAL LOW (ref 3.87–5.11)
RDW: 16.1 % — ABNORMAL HIGH (ref 11.5–15.5)
WBC: 6.9 10*3/uL (ref 4.0–10.5)

## 2011-07-01 LAB — BASIC METABOLIC PANEL
GFR calc Af Amer: >90 mL/min (ref >90)
GFR calc non Af Amer: >90 mL/min (ref >90)
Potassium: 3.2 mEq/L — ABNORMAL LOW (ref 3.5–5.1)
Sodium: 130 mEq/L — ABNORMAL LOW (ref 135–145)

## 2011-07-01 LAB — PHOSPHORUS: Phosphorus: 3.6 mg/dL (ref 2.3–4.6)

## 2011-07-01 LAB — GLUCOSE, CAPILLARY
Glucose-Capillary: 128 mg/dL — ABNORMAL HIGH (ref 70–99)
Glucose-Capillary: 150 mg/dL — ABNORMAL HIGH (ref 70–99)

## 2011-07-01 LAB — MAGNESIUM: Magnesium: 1.4 mg/dL — ABNORMAL LOW (ref 1.5–2.5)

## 2011-07-01 MED ORDER — FAT EMULSION 20 % IV EMUL
250.0000 mL | INTRAVENOUS | Status: AC
  Administered 2011-07-01: 250 mL via INTRAVENOUS
  Filled 2011-07-01: qty 250

## 2011-07-01 MED ORDER — POTASSIUM CHLORIDE 10 MEQ/50ML IV SOLN
10.0000 meq | INTRAVENOUS | Status: AC
  Administered 2011-07-01 (×4): 10 meq via INTRAVENOUS
  Filled 2011-07-01 (×4): qty 50

## 2011-07-01 MED ORDER — MAGNESIUM SULFATE 40 MG/ML IJ SOLN
2.0000 g | Freq: Once | INTRAMUSCULAR | Status: AC
  Administered 2011-07-01: 2 g via INTRAVENOUS
  Filled 2011-07-01: qty 50

## 2011-07-01 MED ORDER — M.V.I. ADULT IV INJ
INJECTION | INTRAVENOUS | Status: AC
  Administered 2011-07-01: 18:00:00 via INTRAVENOUS
  Filled 2011-07-01: qty 2000

## 2011-07-01 NOTE — Progress Notes (Signed)
Patient ID: [name], female   DOB: 03/19/1932, 78 y.o.   MRN: [id number] 55 Days Post-Op  Subjective: Pt doing ok, c/o normal abd discomfort.  Objective: Vital signs in last 24 hours: Temp:  [98.9 F (37.2 C)-99.2 F (37.3 C)] 99.1 F (37.3 C) (01/02 4098) Pulse Rate:  [86-87] 86  (01/02 0608) Resp:  [18-20] 20  (01/02 1191) BP: (130-138)/(75-79) 133/75 mmHg (01/02 0608) SpO2:  [96 %] 96 % (01/02 4782) Weight:  [154 lb 12.2 oz (70.2 kg)] 154 lb 12.2 oz (70.2 kg) (01/02 9562) Last BM Date: 06/29/11  Intake/Output from previous day: 01/01 0701 - 01/02 0700 In: 120 [P.O.:120] Out: 2175 [Urine:2175] Intake/Output this shift:    PE: Abd: soft, tender, has quite a bit of creamy tan drainage from midline scar.  Also has the same drainage coming from old JP drain site in RUQ.  This is new.  G-tube in place and 1 other JP in place as well.  Lab Results:   Basename 07/01/11 0600 06/30/11 1004  WBC 6.9 7.8  HGB 9.7* 11.5*  HCT 30.4* 35.2*  PLT 244 244   BMET  Basename 07/01/11 0600 06/30/11 1004  NA 130* 132*  K 3.2* 4.0  CL 98 99  CO2 23 24  GLUCOSE 160* 118*  BUN 15 16  CREATININE 0.49* 0.48*  CALCIUM 8.7 9.3   PT/INR No results found for this basename: LABPROT:2,INR:2 in the last 72 hours   Studies/Results: No results found.  Anti-infectives: Anti-infectives     Start     Dose/Rate Route Frequency Ordered Stop   06/03/11 2115   ciprofloxacin (CIPRO) IVPB 400 mg  Status:  Discontinued     Comments: Please mix with NS.      400 mg 200 mL/hr over 60 Minutes Intravenous Every 12 hours 06/03/11 2114 06/03/11 2121   05/30/11 1130   ciprofloxacin (CIPRO) IVPB 400 mg  Status:  Discontinued        400 mg 200 mL/hr over 60 Minutes Intravenous 2 times daily 05/30/11 1038 06/02/11 1618   05/29/11 2100   imipenem-cilastatin (PRIMAXIN) 500 mg in sodium chloride 0.9 % 100 mL IVPB  Status:  Discontinued        500 mg 200 mL/hr over 30 Minutes Intravenous Every 8 hours  05/29/11 2007 06/26/11 1702   05/29/11 2100   vancomycin (VANCOCIN) 1,250 mg in sodium chloride 0.9 % 250 mL IVPB  Status:  Discontinued        1,250 mg 166.7 mL/hr over 90 Minutes Intravenous Every 24 hours 05/29/11 2007 06/03/11 1006   05/07/11 1400  imipenem-cilastatin (PRIMAXIN) 500 mg in sodium chloride 0.9 % 100 mL IVPB       500 mg 200 mL/hr over 30 Minutes Intravenous 3 times per day 05/07/11 1106 05/21/11 1359   05/07/11 1200   micafungin (MYCAMINE) 100 mg in sodium chloride 0.9 % 100 mL IVPB  Status:  Discontinued     Comments: PER PHARMACY      100 mg 100 mL/hr over 1 Hours Intravenous Every 24 hours 05/07/11 1032 05/19/11 1103   05/07/11 1200   vancomycin (VANCOCIN) 1,250 mg in sodium chloride 0.9 % 250 mL IVPB  Status:  Discontinued        1,250 mg 166.7 mL/hr over 90 Minutes Intravenous Every 24 hours 05/07/11 1106 05/19/11 1103   05/07/11 1000   micafungin (MYCAMINE) 150 mg in sodium chloride 0.9 % 100 mL IVPB  Status:  Discontinued  Comments: PER PHARMACY      150 mg 100 mL/hr over 1 Hours Intravenous Daily 05/07/11 0951 05/07/11 1029   05/07/11 0300   ertapenem (INVANZ) 1 g in sodium chloride 0.9 % 50 mL IVPB        1 g 100 mL/hr over 30 Minutes Intravenous  Once 05/07/11 0300 05/07/11 0348   05/07/11 0130   cefTRIAXone (ROCEPHIN) 1 g in dextrose 5 % 50 mL IVPB        1 g 100 mL/hr over 30 Minutes Intravenous  Once 05/07/11 0122 05/07/11 0200           Assessment/Plan  1. S/p GJ for perf ulcer 2. Possible short gut syndrome 3. ? Fistula to abd wall given recent new out put  Plan: 1. Will get an UGI today with [facility] to evaluate for short gut syndrome as well as possible fistula.  If she has one or the other, or both, she will then likely need to be NPO and on chronic TNA for now.   LOS: 56 days    [name] E 07/01/2011

## 2011-07-01 NOTE — Progress Notes (Signed)
Physical Therapy Note: Pt refuses to mobilize with therapy today despite education and encouragement. Pt reminded of discharge policy if 3 cancellations. Will attempt at later date. Thanks Toney Sang, PT [phone]

## 2011-07-01 NOTE — Progress Notes (Signed)
Nutrition Consult/Follow-up  RD consult for EN initiation & management. Plan is for UGI today with SBF to evaluate for short gut syndrome and possible fistula. RD spoke with Barnetta Chapel, PA regarding nutrition care plan -- hold off on EN initiation at this time.  Diet Order:  NPO  TPN with Clinimix E 5/15 @ 80 ml/hr.  Lipids (20% IVFE @ 10 ml/hr), multivitamins, and trace elements are provided 3 times weekly (MWF) due to national backorder.  Provides 1,568 kcal and 96 grams protein daily (based on weekly average).  Meets 88% minimum estimated kcal and 100% minimum estimated protein needs.  Re-estimated Nutrition Needs: 1,750-1,950 kcals, 95-110 gm protein  Meds: Scheduled Meds:   . cloNIDine  0.1 mg Transdermal Weekly  . feeding supplement  237 mL Oral TID WC  . feeding supplement  1 Container Oral TID WC  . insulin aspart  0-9 Units Subcutaneous Q4H  . lip balm  1 application Topical BID  . magnesium sulfate 1 - 4 g bolus IVPB  2 g Intravenous Once  . magnesium sulfate 1 - 4 g bolus IVPB  2 g Intravenous Once  . metoprolol tartrate  12.5 mg Oral BID  . pantoprazole  40 mg Oral BID AC  . potassium chloride  10 mEq Intravenous Q1 Hr x 4   Continuous Infusions:   . sodium chloride 20 mL/hr at 06/30/11 2356  . fat emulsion 240 mL (06/29/11 1803)  . fat emulsion    . TPN (CLINIMIX) +/- additives 80 mL/hr at 06/30/11 1731  . TPN (CLINIMIX) +/- additives 80 mL/hr at 06/29/11 1802  . TPN (CLINIMIX) +/- additives     PRN Meds:.acetaminophen, hydrALAZINE, LORazepam, morphine injection, ondansetron (ZOFRAN) IV, oxyCODONE, phenol, sodium chloride  Labs:  CMP     Component Value Date/Time   NA 130* 07/01/2011 0600   K 3.2* 07/01/2011 0600   CL 98 07/01/2011 0600   CO2 23 07/01/2011 0600   GLUCOSE 160* 07/01/2011 0600   BUN 15 07/01/2011 0600   CREATININE 0.49* 07/01/2011 0600   CALCIUM 8.7 07/01/2011 0600   PROT 6.2 06/29/2011 0500   ALBUMIN 2.1* 06/29/2011 0500   AST 18 06/29/2011 0500   ALT  19 06/29/2011 0500   ALKPHOS 81 06/29/2011 0500   BILITOT 0.3 06/29/2011 0500   GFRNONAA >90 07/01/2011 0600   GFRAA >90 07/01/2011 0600     Intake/Output Summary (Last 24 hours) at 07/01/11 1419 Last data filed at 07/01/11 1300  Gross per 24 hour  Intake      0 ml  Output   2150 ml  Net  -2150 ml    Weight Status:  70.2 kg (1/2) -- trending down  Nutrition Dx:  Inadequate Oral Intake, ongoing  Goal:  TPN to meet >90% of estimated protein needs, maximize energy provision as able during national lipid backorder, met Monitor: TPN prescription, EN initiation, weight, labs, I/O's  Intervention:  TPN per pharmacy  If EN initiated, recommend Vital AF 1.2 formula (elemental, peptide-based) -- initiate at 20 ml/hr, advance 10 ml every 6-8 hours as tolerated until goal rate of 60 ml/hr reached (1,728 kcals, 108 gm protein, 1168 ml of free water)  RD to follow for nutrition care plan   Alger Memos Pager #:  [phone]

## 2011-07-01 NOTE — Progress Notes (Signed)
Patient seen and examined, complains  Of intermittent  abdominal pain.  Objective: Vital signs in last 24 hours: Temp:  [98.3 F (36.8 C)-99.2 F (37.3 C)] 98.3 F (36.8 C) (01/02 1315) Pulse Rate:  [85-88] 85  (01/02 1315) Resp:  [20] 20  (01/02 0608) BP: (120-138)/(71-79) 130/79 mmHg (01/02 1315) SpO2:  [96 %-97 %] 97 % (01/02 1315) Weight:  [70.2 kg (154 lb 12.2 oz)] 154 lb 12.2 oz (70.2 kg) (01/02 3244) Weight change: 0 kg (0 lb) Last BM Date: 06/29/11  Intake/Output from previous day: 01/01 0701 - 01/02 0700 In: 120 [P.O.:120] Out: 2175 [Urine:2175] Total I/O In: -  Out: 750 [Urine:750]   Physical Exam: General: Alert, awake, in no acute distress.  Heart: Regular rate and rhythm, without murmurs  Lungs: Clear to auscultation bilaterally.  Abdomen: Soft, mildly tender, nondistended, positive bowel sounds. Dressing and PEG in place.  Extremities: No clubbing cyanosis or edema with positive pedal pulses.  Neuro: Grossly intact, nonfocal     Lab Results: Results for orders placed during the hospital encounter of 05/06/11 (from the past 24 hour(s))  GLUCOSE, CAPILLARY     Status: Abnormal   Collection Time   06/30/11 10:12 PM      Component Value Range   Glucose-Capillary 152 (*) 70 - 99 (mg/dL)   Comment 1 Notify RN    GLUCOSE, CAPILLARY     Status: Abnormal   Collection Time   06/30/11 11:53 PM      Component Value Range   Glucose-Capillary 153 (*) 70 - 99 (mg/dL)   Comment 1 Notify RN    GLUCOSE, CAPILLARY     Status: Abnormal   Collection Time   07/01/11  4:32 AM      Component Value Range   Glucose-Capillary 148 (*) 70 - 99 (mg/dL)   Comment 1 Notify RN    CBC     Status: Abnormal   Collection Time   07/01/11  6:00 AM      Component Value Range   WBC 6.9  4.0 - 10.5 (K/uL)   RBC 3.57 (*) 3.87 - 5.11 (MIL/uL)   Hemoglobin 9.7 (*) 12.0 - 15.0 (g/dL)   HCT 01.0 (*) 27.2 - 46.0 (%)   MCV 85.2  78.0 - 100.0 (fL)   MCH 27.2  26.0 - 34.0 (pg)   MCHC 31.9   30.0 - 36.0 (g/dL)   RDW 53.6 (*) 64.4 - 15.5 (%)   Platelets 244  150 - 400 (K/uL)  BASIC METABOLIC PANEL     Status: Abnormal   Collection Time   07/01/11  6:00 AM      Component Value Range   Sodium 130 (*) 135 - 145 (mEq/L)   Potassium 3.2 (*) 3.5 - 5.1 (mEq/L)   Chloride 98  96 - 112 (mEq/L)   CO2 23  19 - 32 (mEq/L)   Glucose, Bld 160 (*) 70 - 99 (mg/dL)   BUN 15  6 - 23 (mg/dL)   Creatinine, Ser 0.34 (*) 0.50 - 1.10 (mg/dL)   Calcium 8.7  8.4 - 74.2 (mg/dL)   GFR calc non Af Amer >90  >90 (mL/min)   GFR calc Af Amer >90  >90 (mL/min)  PHOSPHORUS     Status: Normal   Collection Time   07/01/11  6:00 AM      Component Value Range   Phosphorus 3.6  2.3 - 4.6 (mg/dL)  MAGNESIUM     Status: Abnormal   Collection Time  07/01/11  6:00 AM      Component Value Range   Magnesium 1.4 (*) 1.5 - 2.5 (mg/dL)  GLUCOSE, CAPILLARY     Status: Abnormal   Collection Time   07/01/11  7:27 AM      Component Value Range   Glucose-Capillary 167 (*) 70 - 99 (mg/dL)   Comment 1 Notify RN    GLUCOSE, CAPILLARY     Status: Abnormal   Collection Time   07/01/11 12:43 PM      Component Value Range   Glucose-Capillary 128 (*) 70 - 99 (mg/dL)    Studies/Results: No results found.  Medications:    . cloNIDine  0.1 mg Transdermal Weekly  . feeding supplement  237 mL Oral TID WC  . feeding supplement  1 Container Oral TID WC  . insulin aspart  0-9 Units Subcutaneous Q4H  . lip balm  1 application Topical BID  . magnesium sulfate 1 - 4 g bolus IVPB  2 g Intravenous Once  . metoprolol tartrate  12.5 mg Oral BID  . pantoprazole  40 mg Oral BID AC  . potassium chloride  10 mEq Intravenous Q1 Hr x 4    acetaminophen, hydrALAZINE, LORazepam, morphine injection, ondansetron (ZOFRAN) IV, oxyCODONE, phenol, sodium chloride     . sodium chloride 20 mL/hr at 06/30/11 2356  . fat emulsion 240 mL (06/29/11 1803)  . fat emulsion    . TPN (CLINIMIX) +/- additives 80 mL/hr at 06/30/11 1731  . TPN  (CLINIMIX) +/- additives 80 mL/hr at 06/29/11 1802  . TPN (CLINIMIX) +/- additives      Assessment/Plan:  Intra-abdominal abscess  Perc drain was,removedby IR 12/31 repeat CT showed improvement  continue pain control.  Abdominal pain: Will check UA , culture as urine in foley looks cloudy.  Perforated peptic ulcer  S/p Ex-Lap with Lysis of adhesions, Antrectomy with duodenal resect. Billroth 2 reconstruction, omental patching of Duodenal bulb, Open Cholecystectomy, Gastrostomy and Jejunostomy on 11/08   Esophagitis, erosive  continue protonix  Diabetes Mellitus: Continue SSI   Malnutrition  on TNA, poor po intake noted ,tube feeding was ordered by [facility] Hypokalemia: Pharmacy to replace Potassium Deconditioning/ Disposition:  Continue PT/OT .plans for SNF on discharge        LOS: 56 days   [name] S 07/01/2011, 4:14 PM

## 2011-07-01 NOTE — Progress Notes (Signed)
PARENTERAL NUTRITION CONSULT NOTE - FOLLOW UP  Pharmacy Consult for TPN Indication: TF intolerance  Patient Measurements: Height: 5\' 6"  (167.6 cm) Weight: 154 lb 12.2 oz (70.2 kg) IBW/kg (Calculated) : 59.3   Vital Signs: Temp: 99.1 F (37.3 C) (01/02 0608) Temp src: Oral (01/02 0608) BP: 133/75 mmHg (01/02 0608) Pulse Rate: 86  (01/02 0608) Intake/Output from previous day: 01/01 0701 - 01/02 0700 In: 120 [P.O.:120] Out: 2175 [Urine:2175] Intake/Output from this shift:    Labs:  Basename 07/01/11 0600 06/30/11 1004 06/29/11 0500  WBC 6.9 7.8 6.4  HGB 9.7* 11.5* 9.5*  HCT 30.4* 35.2* 29.6*  PLT 244 244 254  APTT -- -- --  INR -- -- --     Basename 07/01/11 0600 06/30/11 1004 06/29/11 0500  NA 130* 132* 133*  K 3.2* 4.0 3.9  CL 98 99 99  CO2 23 24 26   GLUCOSE 160* 118* 126*  BUN 15 16 18   CREATININE 0.49* 0.48* 0.54  LABCREA -- -- --  CREAT24HRUR -- -- --  CALCIUM 8.7 9.3 8.7  MG 1.4* 1.4* 1.6  PHOS 3.6 4.5 5.3*  PROT -- -- 6.2  ALBUMIN -- -- 2.1*  AST -- -- 18  ALT -- -- 19  ALKPHOS -- -- 81  BILITOT -- -- 0.3  BILIDIR -- -- --  IBILI -- -- --  PREALBUMIN -- -- 15.0*  TRIG -- -- 128  CHOLHDL -- -- --  CHOL -- -- 125   Estimated Creatinine Clearance: 55.1 ml/min (by C-G formula based on Cr of 0.49).    Basename 07/01/11 0727 07/01/11 0432 06/30/11 2353  GLUCAP 167* 148* 153*   Insulin Requirements in the past 24 hours:  Requiring 5 units of SSI since 1800 yesterday 50 units insulin in TPN  Current Nutrition:  Clinimix 5/15 TPN at 55ml/hr Provides 96gm protein  Assessment: 79 yof previously on TPN (for erosive esphagitis with perforated pyloric ulcer s/p lap). S/p gastrostomy, gastrectomy and cholecystectomy 11/7. TPN initially started 05/12/11-05/13/11. Resumed TPN on 11/20 for increase J-tube output and J-tube displacement. 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 TPN ongoing d/t poor PO intake, changed to continuous 24 hour infusion d/t low Na. Not  taking PO well per MD assessment.  For UGI today with SBF to eval for short gut syndrome and possible fistula.  If so, likely NPO and chronic TNA.  Nutritional Goals:  1875-2000 kCal, 96-110 grams of protein per day  Plan:  1. Change formula to include lytes to Clinimix E5/15 at 32ml/hr 2.  Lipids, trace elements + MVI MWF 2/2 national shortage 2. Repeat magnesium 2gm IV x1 (still low despite dose yesterday) 3.  Replete K with 4 KCl runs 3. F/u AM BMET, Mg, Phos  Angelena Sand L. Illene Bolus, PharmD, BCPS Clinical Pharmacist Pager: [phone] 07/01/2011 9:58 AM

## 2011-07-01 NOTE — Progress Notes (Signed)
Utilization Review Completed.[name] T1/07/2011   

## 2011-07-02 ENCOUNTER — Inpatient Hospital Stay (HOSPITAL_COMMUNITY): Payer: Medicare HMO

## 2011-07-02 LAB — CBC
HCT: 30.9 % — ABNORMAL LOW (ref 36.0–46.0)
Hemoglobin: 10 g/dL — ABNORMAL LOW (ref 12.0–15.0)
MCH: 27.8 pg (ref 26.0–34.0)
MCHC: 32.4 g/dL (ref 30.0–36.0)
RBC: 3.6 MIL/uL — ABNORMAL LOW (ref 3.87–5.11)

## 2011-07-02 LAB — COMPREHENSIVE METABOLIC PANEL
AST: 19 U/L (ref 0–37)
Albumin: 2.2 g/dL — ABNORMAL LOW (ref 3.5–5.2)
Chloride: 101 mEq/L (ref 96–112)
Creatinine, Ser: 0.48 mg/dL — ABNORMAL LOW (ref 0.50–1.10)
Total Bilirubin: 0.3 mg/dL (ref 0.3–1.2)
Total Protein: 6.8 g/dL (ref 6.0–8.3)

## 2011-07-02 LAB — GLUCOSE, CAPILLARY
Glucose-Capillary: 135 mg/dL — ABNORMAL HIGH (ref 70–99)
Glucose-Capillary: 150 mg/dL — ABNORMAL HIGH (ref 70–99)
Glucose-Capillary: 135 mg/dL — ABNORMAL HIGH (ref 70–99)

## 2011-07-02 LAB — MAGNESIUM: Magnesium: 1.5 mg/dL (ref 1.5–2.5)

## 2011-07-02 LAB — PHOSPHORUS: Phosphorus: 4.2 mg/dL (ref 2.3–4.6)

## 2011-07-02 LAB — URINE CULTURE: Culture  Setup Time: 201301030032

## 2011-07-02 MED ORDER — IOHEXOL 300 MG/ML  SOLN
150.0000 mL | Freq: Once | INTRAMUSCULAR | Status: AC | PRN
  Administered 2011-07-02: 150 mL

## 2011-07-02 MED ORDER — INSULIN REGULAR HUMAN 100 UNIT/ML IJ SOLN
INTRAVENOUS | Status: AC
  Administered 2011-07-02: 18:00:00 via INTRAVENOUS
  Filled 2011-07-02: qty 2000

## 2011-07-02 MED ORDER — POTASSIUM CHLORIDE 10 MEQ/50ML IV SOLN
10.0000 meq | INTRAVENOUS | Status: AC
  Administered 2011-07-02 (×4): 10 meq via INTRAVENOUS
  Filled 2011-07-02 (×4): qty 50

## 2011-07-02 MED ORDER — MAGNESIUM SULFATE 40 MG/ML IJ SOLN
2.0000 g | Freq: Once | INTRAMUSCULAR | Status: AC
  Administered 2011-07-02: 2 g via INTRAVENOUS
  Filled 2011-07-02: qty 50

## 2011-07-02 NOTE — Progress Notes (Signed)
BII appears to be gastroileostomy.  Agree that patient will need revision in about 1-2 months

## 2011-07-02 NOTE — Progress Notes (Signed)
PARENTERAL NUTRITION CONSULT NOTE - FOLLOW UP  Pharmacy Consult for TPN Indication: TF intolerance  Patient Measurements: Height: 5\' 6"  (167.6 cm) Weight: 154 lb 5.2 oz (70 kg) IBW/kg (Calculated) : 59.3   Vital Signs: Temp: 98.8 F (37.1 C) (01/03 0650) Temp src: Oral (01/03 0650) BP: 147/88 mmHg (01/03 0650) Pulse Rate: 86  (01/03 0650) Intake/Output from previous day: 01/02 0701 - 01/03 0700 In: 360 [P.O.:360] Out: 2500 [Urine:2500] Intake/Output from this shift:    Labs:  Basename 07/02/11 0500 07/01/11 0600 06/30/11 1004  WBC 6.5 6.9 7.8  HGB 10.0* 9.7* 11.5*  HCT 30.9* 30.4* 35.2*  PLT 238 244 244  APTT -- -- --  INR -- -- --     Basename 07/02/11 0500 07/01/11 0600 06/30/11 1004  NA 134* 130* 132*  K 3.5 3.2* 4.0  CL 101 98 99  CO2 23 23 24   GLUCOSE 137* 160* 118*  BUN 15 15 16   CREATININE 0.48* 0.49* 0.48*  LABCREA -- -- --  CREAT24HRUR -- -- --  CALCIUM 9.1 8.7 9.3  MG 1.5 1.4* 1.4*  PHOS 4.2 3.6 4.5  PROT 6.8 -- --  ALBUMIN 2.2* -- --  AST 19 -- --  ALT 22 -- --  ALKPHOS 86 -- --  BILITOT 0.3 -- --  BILIDIR -- -- --  IBILI -- -- --  PREALBUMIN -- -- --  TRIG -- -- --  CHOLHDL -- -- --  CHOL -- -- --   Estimated Creatinine Clearance: 55.1 ml/min (by C-G formula based on Cr of 0.48).    Basename 07/02/11 0758 07/02/11 0419 07/02/11 0142  GLUCAP 138* 130* 135*   Insulin Requirements in the past 24 hours:  Requiring 2 units of SSI since 1800 yesterday 50 units insulin in TPN.  CBG well controlled.  Current Nutrition:  Clinimix 5/15 TPN at 22ml/hr Provides 96gm protein  Assessment: 79 yof previously on TPN (for erosive esphagitis with perforated pyloric ulcer s/p lap). S/p gastrostomy, gastrectomy and cholecystectomy 11/7. TPN initially started 05/12/11-05/13/11. Resumed TPN on 11/20 for increase J-tube output and J-tube displacement. 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 TPN ongoing d/t poor PO intake, changed to continuous 24 hour infusion d/t  low Na. Not taking PO well per MD assessment.  For UGI today with SBF to eval for short gut syndrome and possible fistula.  If so, likely NPO and chronic TNA.  Lytes: K improved after K runs yesterday.  Mag slightly improved after bolus. Na improved with lytes in TNA.  Phos rising as expected.  Nutritional Goals:  1875-2000 kCal, 96-110 grams of protein per day  Plan:  1. Continue formula with lytes = Clinimix E5/15 at 49ml/hr.  Willing to tolerate Phos slightly greater than high end of normal in order to provide lytes for low Na, K, Mag.  Monitor Lytes including Ca/Phos product - goal <55 (currently 44 with corrected Ca). 2.  Lipids, trace elements + MVI MWF 2/2 national shortage 3. Repeat magnesium 2gm IV x1.  Goal Mag >2. 4.  Replete K with 4 KCl runs.  Goal K >4. 5. F/u AM BMET, Mg, Phos   Amillion Macchia L. Illene Bolus, PharmD, BCPS Clinical Pharmacist Pager: [phone] 07/02/2011 8:23 AM

## 2011-07-02 NOTE — Progress Notes (Signed)
PT NOTE: Pt out of room for a procedure this morning and unable to perform therapy. Will attempt back later if possible.   Harjot Zavadil ([name]) [name] PT, DPT Acute Rehabilitation [phone]

## 2011-07-02 NOTE — Progress Notes (Signed)
Patient seen and examined  Objective: Vital signs in last 24 hours: Temp:  [97.5 F (36.4 C)-98.8 F (37.1 C)] 97.5 F (36.4 C) (01/03 1339) Pulse Rate:  [80-88] 80  (01/03 1339) Resp:  [18] 18  (01/03 1339) BP: (146-150)/(85-90) 146/90 mmHg (01/03 1339) SpO2:  [96 %-98 %] 98 % (01/03 1339) Weight:  [70 kg (154 lb 5.2 oz)] 154 lb 5.2 oz (70 kg) (01/03 0650) Weight change: -0.2 kg (-7.1 oz) Last BM Date: 07/02/11  Intake/Output from previous day: 01/02 0701 - 01/03 0700 In: 360 [P.O.:360] Out: 2500 [Urine:2500] Total I/O In: 120 [P.O.:120] Out: 425 [Urine:425]   Physical Exam: General: Alert, awake, in no acute distress.  Heart: Regular rate and rhythm, without murmurs  Lungs: Clear to auscultation bilaterally.  Abdomen: Soft, mildly tender, nondistended, positive bowel sounds. Dressing and PEG in place.  Extremities: No clubbing cyanosis or edema with positive pedal pulses.  Neuro: Grossly intact, nonfocal     Lab Results: Results for orders placed during the hospital encounter of 05/06/11 (from the past 24 hour(s))  GLUCOSE, CAPILLARY     Status: Abnormal   Collection Time   07/01/11  7:57 PM      Component Value Range   Glucose-Capillary 150 (*) 70 - 99 (mg/dL)   Comment 1 Notify RN    CLOSTRIDIUM DIFFICILE BY PCR     Status: Normal   Collection Time   07/02/11  1:04 AM      Component Value Range   C difficile by pcr NEGATIVE  NEGATIVE   GLUCOSE, CAPILLARY     Status: Abnormal   Collection Time   07/02/11  1:42 AM      Component Value Range   Glucose-Capillary 135 (*) 70 - 99 (mg/dL)   Comment 1 Notify RN     Comment 2 Documented in Chart    GLUCOSE, CAPILLARY     Status: Abnormal   Collection Time   07/02/11  4:19 AM      Component Value Range   Glucose-Capillary 130 (*) 70 - 99 (mg/dL)   Comment 1 Notify RN     Comment 2 Documented in Chart    CBC     Status: Abnormal   Collection Time   07/02/11  5:00 AM      Component Value Range   WBC 6.5  4.0 - 10.5  (K/uL)   RBC 3.60 (*) 3.87 - 5.11 (MIL/uL)   Hemoglobin 10.0 (*) 12.0 - 15.0 (g/dL)   HCT 40.9 (*) 81.1 - 46.0 (%)   MCV 85.8  78.0 - 100.0 (fL)   MCH 27.8  26.0 - 34.0 (pg)   MCHC 32.4  30.0 - 36.0 (g/dL)   RDW 91.4 (*) 78.2 - 15.5 (%)   Platelets 238  150 - 400 (K/uL)  COMPREHENSIVE METABOLIC PANEL     Status: Abnormal   Collection Time   07/02/11  5:00 AM      Component Value Range   Sodium 134 (*) 135 - 145 (mEq/L)   Potassium 3.5  3.5 - 5.1 (mEq/L)   Chloride 101  96 - 112 (mEq/L)   CO2 23  19 - 32 (mEq/L)   Glucose, Bld 137 (*) 70 - 99 (mg/dL)   BUN 15  6 - 23 (mg/dL)   Creatinine, Ser 9.56 (*) 0.50 - 1.10 (mg/dL)   Calcium 9.1  8.4 - 21.3 (mg/dL)   Total Protein 6.8  6.0 - 8.3 (g/dL)   Albumin 2.2 (*) 3.5 - 5.2 (  g/dL)   AST 19  0 - 37 (U/L)   ALT 22  0 - 35 (U/L)   Alkaline Phosphatase 86  39 - 117 (U/L)   Total Bilirubin 0.3  0.3 - 1.2 (mg/dL)   GFR calc non Af Amer >90  >90 (mL/min)   GFR calc Af Amer >90  >90 (mL/min)  MAGNESIUM     Status: Normal   Collection Time   07/02/11  5:00 AM      Component Value Range   Magnesium 1.5  1.5 - 2.5 (mg/dL)  PHOSPHORUS     Status: Normal   Collection Time   07/02/11  5:00 AM      Component Value Range   Phosphorus 4.2  2.3 - 4.6 (mg/dL)  GLUCOSE, CAPILLARY     Status: Abnormal   Collection Time   07/02/11  7:58 AM      Component Value Range   Glucose-Capillary 138 (*) 70 - 99 (mg/dL)   Comment 1 Documented in Chart     Comment 2 Notify RN    GLUCOSE, CAPILLARY     Status: Abnormal   Collection Time   07/02/11 12:09 PM      Component Value Range   Glucose-Capillary 150 (*) 70 - 99 (mg/dL)   Comment 1 Documented in Chart     Comment 2 Notify RN    GLUCOSE, CAPILLARY     Status: Abnormal   Collection Time   07/02/11  4:33 PM      Component Value Range   Glucose-Capillary 135 (*) 70 - 99 (mg/dL)    Studies/Results: Dg Ugi W/water Sol Cm  07/02/2011  *RADIOLOGY REPORT*  Clinical Data: History of Billroth II procedure for  peptic ulcer. Prior upper GI demonstrate rapid emptying from the stomach with opacification of the cecum.  Evaluate for small bowel fistula or short gut.  UPPER GIWITH WATER SOLUTION CONTRAST  Technique: Upper GI series performed with water soluble contrast.  Comparison: Upper GI of 05/25/2011.  CT of 05/27/2011.  Findings: Pre procedure scout film demonstrates right hip arthroplasty.  Moderate stool within the rectum.  Injection of a total of 150 ml of Omnipaque-300 into the gastrostomy tube.  Normal postoperative appearance of the stomach. Gastroesophageal reflux again identified.  Promptly arising from the medial side of the stomach, at approximately the 8 o'clock position, contrast fills a small bowel loop which is likely in the mid to distal small bowel.  This promptly fills the cecum/terminal ileum.  Subsequently, primarily with RPO positioning, a small bowel loop fills from the gastric remnant at approximately the 6 o'clock position.  Example series 58.  This also is likely the mid to distal small bowel, promptly filling the cecum.  Postprocedure overhead film demonstrates no free perforation or extravasation of contrast.  IMPRESSION: 2 foci of communication between the residual stomach and small bowel.  The more inferior communication is felt to represent the gastrojejunostomy site.  Anastomosis is likely within the mid to distal small bowel, given relative small amount of remaining bowel between the stomach and terminal ileum.  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  This communication is also to the mid to distal small bowel, promptly filling the cecum.  Gastroesophageal reflux.  Original Report Authenticated By: Consuello Bossier, M.D.    Medications:    . cloNIDine  0.1 mg Transdermal Weekly  . feeding supplement  237 mL Oral TID WC  . feeding supplement  1 Container Oral TID WC  .  insulin aspart  0-9 Units Subcutaneous Q4H  . lip balm  1 application  Topical BID  . magnesium sulfate 1 - 4 g bolus IVPB  2 g Intravenous Once  . metoprolol tartrate  12.5 mg Oral BID  . pantoprazole  40 mg Oral BID AC  . potassium chloride  10 mEq Intravenous Q1 Hr x 4    acetaminophen, hydrALAZINE, iohexol, LORazepam, morphine injection, ondansetron (ZOFRAN) IV, oxyCODONE, phenol, sodium chloride     . sodium chloride 20 mL/hr at 07/02/11 0311  . fat emulsion 250 mL (07/01/11 1745)  . TPN (CLINIMIX) +/- additives 80 mL/hr at 06/30/11 1731  . TPN (CLINIMIX) +/- additives    . TPN (CLINIMIX) +/- additives 80 mL/hr at 07/01/11 1744    Assessment/Plan:  Intra-abdominal abscess  Perc drain was,removedby IR 12/31 repeat CT showed improvement  continue pain control.  Abdominal pain: Stable.  Perforated peptic ulcer  S/p Ex-Lap with Lysis of adhesions, Antrectomy with duodenal resect. Billroth 2 reconstruction, omental patching of Duodenal bulb, Open Cholecystectomy, Gastrostomy and Jejunostomy on 11/08   Esophagitis, erosive  continue protonix  Diabetes Mellitus:  Continue SSI   Malnutrition  Started on po diet, Cont TNA till she gets repeat laprotomy . Hypokalemia: Resolved. Deconditioning/ Disposition:  Continue PT/OT .plans for SNF on discharge        LOS: 57 days   [name] S 07/02/2011, 5:25 PM

## 2011-07-02 NOTE — Progress Notes (Signed)
Patient ID: [name], female   DOB: 08-Mar-1932, 78 y.o.   MRN: [id number] 56 Days Post-Op  Subjective: Pt doing ok, c/o normal abd discomfort. UGI not done yesterday?  Objective: Vital signs in last 24 hours: Temp:  [98.4 F (36.9 C)-98.8 F (37.1 C)] 98.8 F (37.1 C) (01/03 0650) Pulse Rate:  [86-88] 86  (01/03 0650) Resp:  [18] 18  (01/03 0650) BP: (147-150)/(85-88) 147/88 mmHg (01/03 0650) SpO2:  [96 %-97 %] 96 % (01/03 0650) Weight:  [70 kg (154 lb 5.2 oz)] 154 lb 5.2 oz (70 kg) (01/03 0650) Last BM Date: 07/02/11  Intake/Output from previous day: 01/02 0701 - 01/03 0700 In: 360 [P.O.:360] Out: 2500 [Urine:2500] Intake/Output this shift:    PE: Abd: soft, tender, has quite a bit of creamy tan drainage from midline scar.  Also has the same drainage coming from old JP drain site in RUQ.  G-tube in place and 1 other JP in place as well.  Lab Results:   Basename 07/02/11 0500 07/01/11 0600  WBC 6.5 6.9  HGB 10.0* 9.7*  HCT 30.9* 30.4*  PLT 238 244   BMET  Basename 07/02/11 0500 07/01/11 0600  NA 134* 130*  K 3.5 3.2*  CL 101 98  CO2 23 23  GLUCOSE 137* 160*  BUN 15 15  CREATININE 0.48* 0.49*  CALCIUM 9.1 8.7   PT/INR No results found for this basename: LABPROT:2,INR:2 in the last 72 hours   Studies/Results: Dg Ugi W/water Sol Cm  07/02/2011  *RADIOLOGY REPORT*  Clinical Data: History of Billroth II procedure for peptic ulcer. Prior upper GI demonstrate rapid emptying from the stomach with opacification of the cecum.  Evaluate for small bowel fistula or short gut.  UPPER GIWITH WATER SOLUTION CONTRAST  Technique: Upper GI series performed with water soluble contrast.  Comparison: Upper GI of 05/25/2011.  CT of 05/27/2011.  Findings: Pre procedure scout film demonstrates right hip arthroplasty.  Moderate stool within the rectum.  Injection of a total of 150 ml of Omnipaque-300 into the gastrostomy tube.  Normal postoperative appearance of the stomach.  Gastroesophageal reflux again identified.  Promptly arising from the medial side of the stomach, at approximately the 8 o'clock position, contrast fills a small bowel loop which is likely in the mid to distal small bowel.  This promptly fills the cecum/terminal ileum.  Subsequently, primarily with RPO positioning, a small bowel loop fills from the gastric remnant at approximately the 6 o'clock position.  Example series 58.  This also is likely the mid to distal small bowel, promptly filling the cecum.  Postprocedure overhead film demonstrates no free perforation or extravasation of contrast.  IMPRESSION: 2 foci of communication between the residual stomach and small bowel.  The more inferior communication is felt to represent the gastrojejunostomy site.  Anastomosis is likely within the mid to distal small bowel, given relative small amount of remaining bowel between the stomach and terminal ileum.  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  This communication is also to the mid to distal small bowel, promptly filling the cecum.  Gastroesophageal reflux.  Original Report Authenticated By: Consuello Bossier, M.D.    Anti-infectives: Anti-infectives     Start     Dose/Rate Route Frequency Ordered Stop   06/03/11 2115   ciprofloxacin (CIPRO) IVPB 400 mg  Status:  Discontinued     Comments: Please mix with NS.      400 mg 200 mL/hr over 60 Minutes Intravenous  Every 12 hours 06/03/11 2114 06/03/11 2121   05/30/11 1130   ciprofloxacin (CIPRO) IVPB 400 mg  Status:  Discontinued        400 mg 200 mL/hr over 60 Minutes Intravenous 2 times daily 05/30/11 1038 06/02/11 1618   05/29/11 2100   imipenem-cilastatin (PRIMAXIN) 500 mg in sodium chloride 0.9 % 100 mL IVPB  Status:  Discontinued        500 mg 200 mL/hr over 30 Minutes Intravenous Every 8 hours 05/29/11 2007 06/26/11 1702   05/29/11 2100   vancomycin (VANCOCIN) 1,250 mg in sodium chloride 0.9 % 250 mL IVPB  Status:   Discontinued        1,250 mg 166.7 mL/hr over 90 Minutes Intravenous Every 24 hours 05/29/11 2007 06/03/11 1006   05/07/11 1400   imipenem-cilastatin (PRIMAXIN) 500 mg in sodium chloride 0.9 % 100 mL IVPB        500 mg 200 mL/hr over 30 Minutes Intravenous 3 times per day 05/07/11 1106 05/21/11 1359   05/07/11 1200   micafungin (MYCAMINE) 100 mg in sodium chloride 0.9 % 100 mL IVPB  Status:  Discontinued     Comments: PER PHARMACY      100 mg 100 mL/hr over 1 Hours Intravenous Every 24 hours 05/07/11 1032 05/19/11 1103   05/07/11 1200   vancomycin (VANCOCIN) 1,250 mg in sodium chloride 0.9 % 250 mL IVPB  Status:  Discontinued        1,250 mg 166.7 mL/hr over 90 Minutes Intravenous Every 24 hours 05/07/11 1106 05/19/11 1103   05/07/11 1000   micafungin (MYCAMINE) 150 mg in sodium chloride 0.9 % 100 mL IVPB  Status:  Discontinued     Comments: PER PHARMACY      150 mg 100 mL/hr over 1 Hours Intravenous Daily 05/07/11 0951 05/07/11 1029   05/07/11 0300   ertapenem (INVANZ) 1 g in sodium chloride 0.9 % 50 mL IVPB        1 g 100 mL/hr over 30 Minutes Intravenous  Once 05/07/11 0300 05/07/11 0348   05/07/11 0130   cefTRIAXone (ROCEPHIN) 1 g in dextrose 5 % 50 mL IVPB        1 g 100 mL/hr over 30 Minutes Intravenous  Once 05/07/11 0122 05/07/11 0200           Assessment/Plan  1. S/p GJ for perf ulcer 2. Probable short gut syndrome. Small bowel segment is short and would likely result in malabsorption problems. She will need revision at some point but it is still a little early for repeat laparotomy. No evidence of cutaneous fistula.  Plan: She can eat but will still need TNA for adequate nutrition until time of revision. She could potentially be discharged if SNF will do TNA. Otherwise plans for follow up with [name] as an outpt. Will resume diet.  LOS: 57 days    Marianna Fuss 07/02/2011

## 2011-07-03 LAB — CBC
MCH: 27.9 pg (ref 26.0–34.0)
MCHC: 32.7 g/dL (ref 30.0–36.0)
RDW: 15.8 % — ABNORMAL HIGH (ref 11.5–15.5)

## 2011-07-03 LAB — MAGNESIUM: Magnesium: 1.4 mg/dL — ABNORMAL LOW (ref 1.5–2.5)

## 2011-07-03 LAB — GLUCOSE, CAPILLARY
Glucose-Capillary: 146 mg/dL — ABNORMAL HIGH (ref 70–99)
Glucose-Capillary: 152 mg/dL — ABNORMAL HIGH (ref 70–99)
Glucose-Capillary: 122 mg/dL — ABNORMAL HIGH (ref 70–99)

## 2011-07-03 LAB — PHOSPHORUS: Phosphorus: 4.4 mg/dL (ref 2.3–4.6)

## 2011-07-03 MED ORDER — FAT EMULSION 20 % IV EMUL
240.0000 mL | INTRAVENOUS | Status: AC
  Administered 2011-07-03: 240 mL via INTRAVENOUS
  Filled 2011-07-03: qty 250

## 2011-07-03 MED ORDER — LOPERAMIDE HCL 2 MG PO CAPS
2.0000 mg | ORAL_CAPSULE | ORAL | Status: DC | PRN
  Filled 2011-07-03: qty 1

## 2011-07-03 MED ORDER — M.V.I. ADULT IV INJ
INJECTION | INTRAVENOUS | Status: AC
  Administered 2011-07-03: 18:00:00 via INTRAVENOUS
  Filled 2011-07-03: qty 2000

## 2011-07-03 MED ORDER — DEXTROSE 5 % IV SOLN
3.0000 g | Freq: Once | INTRAVENOUS | Status: AC
  Administered 2011-07-03: 3 g via INTRAVENOUS
  Filled 2011-07-03: qty 6

## 2011-07-03 NOTE — Progress Notes (Signed)
Pt has bed at [facility], but SNF's pharmacy unable to obtain pt's TNA over weekend. MD updated. Will plan for d/c to SNF Monday. Dellie Burns, MSW, [place] [phone]

## 2011-07-03 NOTE — Progress Notes (Signed)
LATE ENTRY: On 07/02/11 CSW met with patient and her daughter Langston Reusing regarding skilled rehab placement and confirmed that their choice is [facility]. The facility was contacted and advised and will be able to take patient when medically stable.  Genelle Bal, MSW, LCSW [phone]

## 2011-07-03 NOTE — Progress Notes (Signed)
57 Days Post-Op  Subjective: Pt ok. Not much appetite and occasional pain. She does drink the Ensure/Breeze when offered.   Objective: Vital signs in last 24 hours: Temp:  [97.5 F (36.4 C)-99.7 F (37.6 C)] 99.7 F (37.6 C) (01/04 0500) Pulse Rate:  [80-95] 84  (01/04 0500) Resp:  [18-20] 20  (01/04 0500) BP: (141-166)/(78-97) 141/78 mmHg (01/04 0500) SpO2:  [97 %-98 %] 97 % (01/04 0500) Weight:  [70.5 kg (155 lb 6.8 oz)] 155 lb 6.8 oz (70.5 kg) (01/04 0445) Last BM Date: 07/02/11  Intake/Output this shift:    Physical Exam: BP 141/78  Pulse 84  Temp(Src) 99.7 F (37.6 C) (Oral)  Resp 20  Ht 5\' 6"  (1.676 m)  Wt 70.5 kg (155 lb 6.8 oz)  BMI 25.09 kg/m2  SpO2 97% Abdomen: G-tube stable, no infection RUQ, prior drain site with small amount milky drainage when pressed, NT Midline wound mostly healed, but also with small punctate area superiorly that yields some milky drainage when pressed, NT. Drain otherwise intact.  Labs: CBC  Basename 07/03/11 0546 07/02/11 0500  WBC 7.0 6.5  HGB 10.0* 10.0*  HCT 30.6* 30.9*  PLT 242 238   BMET  Basename 07/02/11 0500 07/01/11 0600  NA 134* 130*  K 3.5 3.2*  CL 101 98  CO2 23 23  GLUCOSE 137* 160*  BUN 15 15  CREATININE 0.48* 0.49*  CALCIUM 9.1 8.7   LFT  Basename 07/02/11 0500  PROT 6.8  ALBUMIN 2.2*  AST 19  ALT 22  ALKPHOS 86  BILITOT 0.3  BILIDIR --  IBILI --  LIPASE --   PT/INR No results found for this basename: LABPROT:2,INR:2 in the last 72 hours ABG No results found for this basename: PHART:2,PCO2:2,PO2:2,HCO3:2 in the last 72 hours  Studies/Results: Dg Ugi W/water Sol Cm  07/02/2011  *RADIOLOGY REPORT*  Clinical Data: History of Billroth II procedure for peptic ulcer. Prior upper GI demonstrate rapid emptying from the stomach with opacification of the cecum.  Evaluate for small bowel fistula or short gut.  UPPER GIWITH WATER SOLUTION CONTRAST  Technique: Upper GI series performed with water soluble  contrast.  Comparison: Upper GI of 05/25/2011.  CT of 05/27/2011.  Findings: Pre procedure scout film demonstrates right hip arthroplasty.  Moderate stool within the rectum.  Injection of a total of 150 ml of Omnipaque-300 into the gastrostomy tube.  Normal postoperative appearance of the stomach. Gastroesophageal reflux again identified.  Promptly arising from the medial side of the stomach, at approximately the 8 o'clock position, contrast fills a small bowel loop which is likely in the mid to distal small bowel.  This promptly fills the cecum/terminal ileum.  Subsequently, primarily with RPO positioning, a small bowel loop fills from the gastric remnant at approximately the 6 o'clock position.  Example series 58.  This also is likely the mid to distal small bowel, promptly filling the cecum.  Postprocedure overhead film demonstrates no free perforation or extravasation of contrast.  IMPRESSION: 2 foci of communication between the residual stomach and small bowel.  The more inferior communication is felt to represent the gastrojejunostomy site.  Anastomosis is likely within the mid to distal small bowel, given relative small amount of remaining bowel between the stomach and terminal ileum.  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  This communication is also to the mid to distal small bowel, promptly filling the cecum.  Gastroesophageal reflux.  Original Report Authenticated By: Consuello Bossier, M.D.  Assessment: Principal Problem:  *Intra-abdominal abscess Active Problems:  DIABETES MELLITUS, TYPE II  HYPERTENSION  Anemia  Hyponatremia  Abdominal pain, acute  Metabolic acidosis  Esophagitis, erosive  Acute respiratory failure   Procedure(s): EXPLORATORY LAPAROTOMY GASTROSTOMY GASTRECTOMY CHOLECYSTECTOMY  Plan: Stable, poor appetite with TPN but encouraged po intake.  It appears she will be ready for SNF soon. Would expect intermittent drainage from  those areas mentioned but no fistulas noted on UGI and CT Will need follow-up with [name] in the next 3-4 weeks for further discussion on revision of gastrojejunostomy.  LOS: 58 days    [name] 07/03/2011

## 2011-07-03 NOTE — Progress Notes (Signed)
Physical Therapy Treatment [name] MRN: [id number] DOB: 06-15-1932 Today's Date: 07/03/2011  PT Assessment/Plan  PT - Assessment/Plan Comments on Treatment Session: Pt requires significant and repeated encouragement to get out of bed. Pt continues to require total assistance for transfers. Will try mobility with RW again next session and see if pt able to tolerate better.  PT Plan: Discharge plan remains appropriate PT Frequency: Min 3X/week Follow Up Recommendations: Skilled nursing facility Equipment Recommended: Defer to next venue PT Goals  Acute Rehab PT Goals PT Goal Formulation: With patient Time For Goal Achievement: 2 weeks Pt will go Supine/Side to Sit: with modified independence (progressed secondary to met last session) PT Goal: Supine/Side to Sit - Progress: Progressing toward goal Pt will go Sit to Supine/Side: with min assist Pt will Transfer Bed to Chair/Chair to Bed: with min assist (progressed secondary to met last session) PT Transfer Goal: Bed to Chair/Chair to Bed - Progress: Progressing toward goal Pt will Ambulate: 1 - 15 feet;with mod assist;with rolling walker PT Goal: Ambulate - Progress: Progressing toward goal  PT Treatment Precautions/Restrictions  Precautions Precautions: Fall Precaution Comments: Weakness.  Restrictions Weight Bearing Restrictions: No Mobility (including Balance) Bed Mobility Bed Mobility: Yes Rolling Right: 4: Min assist Rolling Right Details (indicate cue type and reason): Tactile cues for sequence, max verbal cues for intiation.  Right Sidelying to Sit: 4: Min assist Right Sidelying to Sit Details (indicate cue type and reason): tactile/verbal cues for initiation, sequence. Pt attempted movement 3x requiring much encouragement to continue.  Sitting - Scoot to Edge of Bed: 3: Mod assist Sitting - Scoot to Edge of Bed Details (indicate cue type and reason): Assist with pad, verbal cues for initiation.    Transfers Transfers: Yes Sit to Stand: 3: Mod assist Sit to Stand Details (indicate cue type and reason): Assist secondary to weakness. Pt with initial posterior lean and LEs locked in extension. Pt unable to remain in standing, must reach for support.  Stand to Sit: 1: +2 Total assist (pt = 40%) Stand to Sit Details: Verbal cues for squaring with chair prior to sitting.  Stand Pivot Transfers: 1: +2 Total assist;Patient percentage (comment) (pt = 30%) Stand Pivot Transfer Details (indicate cue type and reason): Pt quickly looses strength requiring increasing assist with transfer. Pt reaches for armrests of chair in standing.  Ambulation/Gait Ambulation/Gait: No (pt unable )    Exercise  General Exercises - Lower Extremity Ankle Circles/Pumps: AROM;Strengthening;Both;15 reps;Supine;Seated (into pillow) Quad Sets: AROM;Both;5 reps (attempted, pt unable to follow direction. ) Gluteal Sets: AROM;Both;10 reps End of Session PT - End of Session Equipment Utilized During Treatment: Gait belt Activity Tolerance: Patient limited by fatigue Patient left: in chair;with call bell in reach;with family/visitor present (with sitter) Nurse Communication: Mobility status for transfers General Behavior During Session: [name] for tasks performed Cognition: Impaired (Pt having difficulty focusing on task. Easily distracted) Cognitive Impairment: Decreased memory and requesting return to supine ~ every 5 minutes, decreased ability to understand need for increased mobility  Cheek, [name] 07/03/2011, 1:30 PM  [name]) [name] PT, DPT Acute Rehabilitation [phone]

## 2011-07-03 NOTE — Progress Notes (Signed)
PARENTERAL NUTRITION CONSULT NOTE - FOLLOW UP  Pharmacy Consult for TPN Indication: TF intolerance  Patient Measurements: Height: 5\' 6"  (167.6 cm) Weight: 155 lb 6.8 oz (70.5 kg) IBW/kg (Calculated) : 59.3   Vital Signs: Temp: 99.7 F (37.6 C) (01/04 0500) Temp src: Oral (01/04 0500) BP: 141/78 mmHg (01/04 0500) Pulse Rate: 84  (01/04 0500) Intake/Output from previous day: 01/03 0701 - 01/04 0700 In: 420 [P.O.:420] Out: 1525 [Urine:1525] Intake/Output from this shift:    Labs:  Basename 07/03/11 0546 07/02/11 0500 07/01/11 0600  WBC 7.0 6.5 6.9  HGB 10.0* 10.0* 9.7*  HCT 30.6* 30.9* 30.4*  PLT 242 238 244  APTT -- -- --  INR -- -- --     Basename 07/03/11 0546 07/02/11 0500 07/01/11 0600 06/30/11 1004  NA -- 134* 130* 132*  K -- 3.5 3.2* 4.0  CL -- 101 98 99  CO2 -- 23 23 24   GLUCOSE -- 137* 160* 118*  BUN -- 15 15 16   CREATININE -- 0.48* 0.49* 0.48*  LABCREA -- -- -- --  CREAT24HRUR -- -- -- --  CALCIUM -- 9.1 8.7 9.3  MG 1.4* 1.5 1.4* --  PHOS 4.4 4.2 3.6 --  PROT -- 6.8 -- --  ALBUMIN -- 2.2* -- --  AST -- 19 -- --  ALT -- 22 -- --  ALKPHOS -- 86 -- --  BILITOT -- 0.3 -- --  BILIDIR -- -- -- --  IBILI -- -- -- --  PREALBUMIN -- -- -- --  TRIG -- -- -- --  CHOLHDL -- -- -- --  CHOL -- -- -- --   Estimated Creatinine Clearance: 55.1 ml/min (by C-G formula based on Cr of 0.48).    Basename 07/03/11 0441 07/03/11 0025 07/02/11 2212  GLUCAP 146* 143* 146*   Insulin Requirements in the past 24 hours:  3 units SSI since 1800 yesterday 50 units insulin in TPN CBGs 143-146 (at goal)  Current Nutrition:  Clinimix E5/15 at 67ml/hr providing 96gm protein  Assessment: 79 yof on TPN for erosive esophagitis with perforated pyloric ulcer s/p lap. S/p gastrostomy, gastrectomy and cholecystectomy 11/7. TPN initially started 05/12/11-05/13/11. Resumed TPN on 11/20 for increase J-tube output and J-tube displacement. 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 TPN ongoing  d/t poor PO intake, changed to continuous 24 hour infusion d/t low Na. PO intake is inconsistent but is taking some PO. 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 Plan to continue TPN until f/u surgery. Likely will DC to SNF on TPN.   Lytes: Only Mg + Phos drawn today, no BMET. Phos is creeping up again but Ca/Phos remains<55. Mg is low. Will tolerate elevated phos so long as Ca/Phos <55 and other electrolytes such as Na and K are required.   Nutritional Goals:  1875-2000 kCal, 96-110 grams of protein per day  Plan:  1. Continue clinimix E5/15 at 25ml/hr with lipids at 49ml/hr, MVI and trace elements only on MWF d/t national backorder 2. Supplement Mg 3gm IV x 1 3. F/u AM BMET, Mg, Phos  Tahnee Cifuentes, Drake Leach 07/03/2011,8:21 AM

## 2011-07-03 NOTE — Progress Notes (Signed)
Patient seen and examined, had loose bowel movements today.  Objective: Vital signs in last 24 hours: Temp:  [98.1 F (36.7 C)-99.7 F (37.6 C)] 98.7 F (37.1 C) (01/04 1300) Pulse Rate:  [84-95] 92  (01/04 1300) Resp:  [20] 20  (01/04 1300) BP: (134-166)/(78-97) 134/92 mmHg (01/04 1300) SpO2:  [97 %] 97 % (01/04 1300) Weight:  [70.5 kg (155 lb 6.8 oz)] 155 lb 6.8 oz (70.5 kg) (01/04 0445) Weight change: 0.5 kg (1 lb 1.6 oz) Last BM Date: 07/02/11  Intake/Output from previous day: 01/03 0701 - 01/04 0700 In: 420 [P.O.:420] Out: 1525 [Urine:1525] Total I/O In: 120 [P.O.:120] Out: -    Physical Exam: General: Alert, awake, in no acute distress.  Heart: Regular rate and rhythm, without murmurs  Lungs: Clear to auscultation bilaterally.  Abdomen: Soft, mildly tender, nondistended, positive bowel sounds.  Extremities: No clubbing cyanosis or edema with positive pedal pulses.  Neuro: Grossly intact, nonfocal     Lab Results: Results for orders placed during the hospital encounter of 05/06/11 (from the past 24 hour(s))  GLUCOSE, CAPILLARY     Status: Abnormal   Collection Time   07/02/11 10:12 PM      Component Value Range   Glucose-Capillary 146 (*) 70 - 99 (mg/dL)   Comment 1 Documented in Chart     Comment 2 Notify RN    GLUCOSE, CAPILLARY     Status: Abnormal   Collection Time   07/03/11 12:25 AM      Component Value Range   Glucose-Capillary 143 (*) 70 - 99 (mg/dL)   Comment 1 Notify RN     Comment 2 Documented in Chart    GLUCOSE, CAPILLARY     Status: Abnormal   Collection Time   07/03/11  4:41 AM      Component Value Range   Glucose-Capillary 146 (*) 70 - 99 (mg/dL)   Comment 1 Notify RN     Comment 2 Documented in Chart    CBC     Status: Abnormal   Collection Time   07/03/11  5:46 AM      Component Value Range   WBC 7.0  4.0 - 10.5 (K/uL)   RBC 3.59 (*) 3.87 - 5.11 (MIL/uL)   Hemoglobin 10.0 (*) 12.0 - 15.0 (g/dL)   HCT 40.9 (*) 81.1 - 46.0 (%)   MCV  85.2  78.0 - 100.0 (fL)   MCH 27.9  26.0 - 34.0 (pg)   MCHC 32.7  30.0 - 36.0 (g/dL)   RDW 91.4 (*) 78.2 - 15.5 (%)   Platelets 242  150 - 400 (K/uL)  PHOSPHORUS     Status: Normal   Collection Time   07/03/11  5:46 AM      Component Value Range   Phosphorus 4.4  2.3 - 4.6 (mg/dL)  MAGNESIUM     Status: Abnormal   Collection Time   07/03/11  5:46 AM      Component Value Range   Magnesium 1.4 (*) 1.5 - 2.5 (mg/dL)  GLUCOSE, CAPILLARY     Status: Abnormal   Collection Time   07/03/11  8:09 AM      Component Value Range   Glucose-Capillary 152 (*) 70 - 99 (mg/dL)  GLUCOSE, CAPILLARY     Status: Abnormal   Collection Time   07/03/11 12:09 PM      Component Value Range   Glucose-Capillary 122 (*) 70 - 99 (mg/dL)  GLUCOSE, CAPILLARY     Status: Abnormal  Collection Time   07/03/11  5:01 PM      Component Value Range   Glucose-Capillary 138 (*) 70 - 99 (mg/dL)    Studies/Results: Dg Ugi W/water Sol Cm  07/02/2011  *RADIOLOGY REPORT*  Clinical Data: History of Billroth II procedure for peptic ulcer. Prior upper GI demonstrate rapid emptying from the stomach with opacification of the cecum.  Evaluate for small bowel fistula or short gut.  UPPER GIWITH WATER SOLUTION CONTRAST  Technique: Upper GI series performed with water soluble contrast.  Comparison: Upper GI of 05/25/2011.  CT of 05/27/2011.  Findings: Pre procedure scout film demonstrates right hip arthroplasty.  Moderate stool within the rectum.  Injection of a total of 150 ml of Omnipaque-300 into the gastrostomy tube.  Normal postoperative appearance of the stomach. Gastroesophageal reflux again identified.  Promptly arising from the medial side of the stomach, at approximately the 8 o'clock position, contrast fills a small bowel loop which is likely in the mid to distal small bowel.  This promptly fills the cecum/terminal ileum.  Subsequently, primarily with RPO positioning, a small bowel loop fills from the gastric remnant at approximately  the 6 o'clock position.  Example series 58.  This also is likely the mid to distal small bowel, promptly filling the cecum.  Postprocedure overhead film demonstrates no free perforation or extravasation of contrast.  IMPRESSION: 2 foci of communication between the residual stomach and small bowel.  The more inferior communication is felt to represent the gastrojejunostomy site.  Anastomosis is likely within the mid to distal small bowel, given relative small amount of remaining bowel between the stomach and terminal ileum.  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  This communication is also to the mid to distal small bowel, promptly filling the cecum.  Gastroesophageal reflux.  Original Report Authenticated By: Consuello Bossier, M.D.    Medications:    . cloNIDine  0.1 mg Transdermal Weekly  . feeding supplement  237 mL Oral TID WC  . feeding supplement  1 Container Oral TID WC  . insulin aspart  0-9 Units Subcutaneous Q4H  . lip balm  1 application Topical BID  . magnesium sulfate 1 - 4 g bolus IVPB  3 g Intravenous Once  . metoprolol tartrate  12.5 mg Oral BID  . pantoprazole  40 mg Oral BID AC    acetaminophen, hydrALAZINE, loperamide, LORazepam, morphine injection, ondansetron (ZOFRAN) IV, oxyCODONE, phenol, sodium chloride     . sodium chloride 20 mL/hr at 07/02/11 0311  . fat emulsion    . TPN (CLINIMIX) +/- additives 80 mL/hr at 07/02/11 1736  . TPN (CLINIMIX) +/- additives      Assessment/Plan:  Intra-abdominal abscess  Perc drain was,removedby IR 12/31 repeat CT showed improvement  continue pain control.   Diarrhea: Will check Stool Cdiff PCR.  Abdominal pain: Stable.   Perforated peptic ulcer  S/p Ex-Lap with Lysis of adhesions, Antrectomy with duodenal resect. Billroth 2 reconstruction, omental patching of Duodenal bulb, Open Cholecystectomy, Gastrostomy and Jejunostomy on 11/08    Esophagitis, erosive  continue protonix    Diabetes Mellitus:  Continue SSI    Malnutrition  Started on po diet, Cont TNA till she gets repeat laprotomy .  Hypokalemia: Resolved.  Deconditioning/ Disposition:  Continue PT/OT .plans for SNF on discharge        LOS: 58 days   Ula Couvillon S 07/03/2011, 6:18 PM

## 2011-07-04 LAB — GLUCOSE, CAPILLARY
Glucose-Capillary: 143 mg/dL — ABNORMAL HIGH (ref 70–99)
Glucose-Capillary: 148 mg/dL — ABNORMAL HIGH (ref 70–99)
Glucose-Capillary: 152 mg/dL — ABNORMAL HIGH (ref 70–99)

## 2011-07-04 LAB — BASIC METABOLIC PANEL
BUN: 17 mg/dL (ref 6–23)
Calcium: 8.8 mg/dL (ref 8.4–10.5)
GFR calc Af Amer: >90 mL/min (ref >90)
GFR calc non Af Amer: >90 mL/min (ref >90)
Glucose, Bld: 132 mg/dL — ABNORMAL HIGH (ref 70–99)
Potassium: 3.7 mEq/L (ref 3.5–5.1)
Sodium: 130 mEq/L — ABNORMAL LOW (ref 135–145)

## 2011-07-04 LAB — CBC
HCT: 30 % — ABNORMAL LOW (ref 36.0–46.0)
Hemoglobin: 9.7 g/dL — ABNORMAL LOW (ref 12.0–15.0)
MCH: 27.7 pg (ref 26.0–34.0)
MCHC: 32.3 g/dL (ref 30.0–36.0)
RDW: 16 % — ABNORMAL HIGH (ref 11.5–15.5)

## 2011-07-04 LAB — MAGNESIUM: Magnesium: 1.7 mg/dL (ref 1.5–2.5)

## 2011-07-04 MED ORDER — POTASSIUM CHLORIDE 10 MEQ/100ML IV SOLN
10.0000 meq | INTRAVENOUS | Status: AC
  Administered 2011-07-04 (×4): 10 meq via INTRAVENOUS
  Filled 2011-07-04 (×4): qty 100

## 2011-07-04 MED ORDER — INSULIN REGULAR HUMAN 100 UNIT/ML IJ SOLN
INTRAMUSCULAR | Status: AC
  Administered 2011-07-04: 18:00:00 via INTRAVENOUS
  Filled 2011-07-04: qty 2000

## 2011-07-04 MED ORDER — INSULIN ASPART 100 UNIT/ML ~~LOC~~ SOLN
0.0000 [IU] | Freq: Three times a day (TID) | SUBCUTANEOUS | Status: DC
  Administered 2011-07-04: 1 [IU] via SUBCUTANEOUS
  Administered 2011-07-04: 2 [IU] via SUBCUTANEOUS
  Administered 2011-07-05 – 2011-07-06 (×4): 1 [IU] via SUBCUTANEOUS

## 2011-07-04 MED ORDER — MAGNESIUM SULFATE 50 % IJ SOLN
3.0000 g | Freq: Once | INTRAVENOUS | Status: AC
  Administered 2011-07-04: 3 g via INTRAVENOUS
  Filled 2011-07-04: qty 6

## 2011-07-04 NOTE — Progress Notes (Signed)
The patient was seen, examined and PA note reviewed and data reviewed.  I agree with the plan of action. 

## 2011-07-04 NOTE — Progress Notes (Signed)
PARENTERAL NUTRITION CONSULT NOTE - FOLLOW UP  Pharmacy Consult for TPN Indication: TF intolerance  Patient Measurements: Height: 5\' 6"  (167.6 cm) Weight: 155 lb 6.8 oz (70.5 kg) IBW/kg (Calculated) : 59.3   Vital Signs: Temp: 99.2 F (37.3 C) (01/05 0548) Temp src: Oral (01/05 0548) BP: 121/67 mmHg (01/05 0548) Pulse Rate: 92  (01/05 0548) Intake/Output from previous day: 01/04 0701 - 01/05 0700 In: 120 [P.O.:120] Out: 1425 [Urine:1425] Intake/Output from this shift:    Labs:  Basename 07/04/11 0600 07/03/11 0546 07/02/11 0500  WBC 7.0 7.0 6.5  HGB 9.7* 10.0* 10.0*  HCT 30.0* 30.6* 30.9*  PLT 205 242 238  APTT -- -- --  INR -- -- --     Basename 07/04/11 0600 07/03/11 0546 07/02/11 0500  NA 130* -- 134*  K 3.7 -- 3.5  CL 99 -- 101  CO2 20 -- 23  GLUCOSE 132* -- 137*  BUN 17 -- 15  CREATININE 0.50 -- 0.48*  LABCREA -- -- --  CREAT24HRUR -- -- --  CALCIUM 8.8 -- 9.1  MG 1.7 1.4* 1.5  PHOS 5.1* 4.4 4.2  PROT -- -- 6.8  ALBUMIN -- -- 2.2*  AST -- -- 19  ALT -- -- 22  ALKPHOS -- -- 86  BILITOT -- -- 0.3  BILIDIR -- -- --  IBILI -- -- --  PREALBUMIN -- -- --  TRIG -- -- --  CHOLHDL -- -- --  CHOL -- -- --   Estimated Creatinine Clearance: 55.1 ml/min (by C-G formula based on Cr of 0.5).    Basename 07/04/11 0620 07/04/11 0035 07/03/11 2052  GLUCAP 142* 148* 136*   Insulin Requirements in the past 24 hours:  3 units SSI since 1800 yesterday 50 units insulin in TPN CBGs well controlled  Current Nutrition:  Clinimix E5/15 at 67ml/hr providing 96gm protein  Assessment: 79 yof on TPN for erosive esophagitis with perforated pyloric ulcer s/p lap. S/p gastrostomy, gastrectomy and cholecystectomy 11/7. TPN initially started 05/12/11-05/13/11. Resumed TPN on 11/20 for increase J-tube output and J-tube displacement. 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 TPN ongoing d/t poor PO intake, changed to continuous 24 hour infusion d/t low Na. PO intake is inconsistent but  is taking some PO. 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 Plan to continue TPN until f/u surgery. Likely will DC to SNF on TPN.   Lytes: Phos again is greater than NL.  Ca/Phos product is currently 52 using corrected Ca (goal <55).  Would tolerate elevated Phos however, with Ca/Phos pdt close to 55, must remove lytes from tna at this time.  Mg improved with bolus but still less than goal of ~2.    Nutritional Goals:  1875-2000 kCal, 96-110 grams of protein per day  Plan:  1. Remove lytes from formula: change to clinimix 5/15 at 58ml/hr with lipids at 67ml/hr, MVI and trace elements only on MWF d/t national backorder 2. Give 3g Mag, 4 K runs - 2/2 low nl and in anticipation of removing lytes from TNA with next bag. 3.  F/u am bmp, mag, phos. 4.  With cbg at goal, decrease freq from q4h to q8h  Jill Side L. Illene Bolus, PharmD, BCPS Clinical Pharmacist Pager: [phone] 07/04/2011 8:56 AM

## 2011-07-04 NOTE — Progress Notes (Signed)
Patient seen and examined, had loose bowel movements today due to short gut syndrome.  Objective: Vital signs in last 24 hours: Temp:  [99.1 F (37.3 C)-99.2 F (37.3 C)] 99.1 F (37.3 C) (01/05 1749) Pulse Rate:  [85-106] 85  (01/05 1749) Resp:  [18-20] 20  (01/05 1749) BP: (121-143)/(67-85) 121/75 mmHg (01/05 1749) SpO2:  [95 %-98 %] 97 % (01/05 1749) Weight change:  Last BM Date: 07/03/11  Intake/Output from previous day: 01/04 0701 - 01/05 0700 In: 1200 [P.O.:120; TPN:1080] Out: 1425 [Urine:1425] Total I/O In: -  Out: 200 [Urine:200]   Physical Exam: General: Alert, awake, in no acute distress.  Heart: Regular rate and rhythm, without murmurs  Lungs: Clear to auscultation bilaterally.  Abdomen: Soft, mildly tender, nondistended, positive bowel sounds.  Extremities: No clubbing cyanosis or edema with positive pedal pulses.  Neuro: Grossly intact, nonfocal     Lab Results: Results for orders placed during the hospital encounter of 05/06/11 (from the past 24 hour(s))  GLUCOSE, CAPILLARY     Status: Abnormal   Collection Time   07/03/11  8:52 PM      Component Value Range   Glucose-Capillary 136 (*) 70 - 99 (mg/dL)   Comment 1 Notify RN    GLUCOSE, CAPILLARY     Status: Abnormal   Collection Time   07/04/11 12:35 AM      Component Value Range   Glucose-Capillary 148 (*) 70 - 99 (mg/dL)  CBC     Status: Abnormal   Collection Time   07/04/11  6:00 AM      Component Value Range   WBC 7.0  4.0 - 10.5 (K/uL)   RBC 3.50 (*) 3.87 - 5.11 (MIL/uL)   Hemoglobin 9.7 (*) 12.0 - 15.0 (g/dL)   HCT 82.9 (*) 56.2 - 46.0 (%)   MCV 85.7  78.0 - 100.0 (fL)   MCH 27.7  26.0 - 34.0 (pg)   MCHC 32.3  30.0 - 36.0 (g/dL)   RDW 13.0 (*) 86.5 - 15.5 (%)   Platelets 205  150 - 400 (K/uL)  BASIC METABOLIC PANEL     Status: Abnormal   Collection Time   07/04/11  6:00 AM      Component Value Range   Sodium 130 (*) 135 - 145 (mEq/L)   Potassium 3.7  3.5 - 5.1 (mEq/L)   Chloride 99  96 -  112 (mEq/L)   CO2 20  19 - 32 (mEq/L)   Glucose, Bld 132 (*) 70 - 99 (mg/dL)   BUN 17  6 - 23 (mg/dL)   Creatinine, Ser 7.84  0.50 - 1.10 (mg/dL)   Calcium 8.8  8.4 - 69.6 (mg/dL)   GFR calc non Af Amer >90  >90 (mL/min)   GFR calc Af Amer >90  >90 (mL/min)  PHOSPHORUS     Status: Abnormal   Collection Time   07/04/11  6:00 AM      Component Value Range   Phosphorus 5.1 (*) 2.3 - 4.6 (mg/dL)  MAGNESIUM     Status: Normal   Collection Time   07/04/11  6:00 AM      Component Value Range   Magnesium 1.7  1.5 - 2.5 (mg/dL)  GLUCOSE, CAPILLARY     Status: Abnormal   Collection Time   07/04/11  6:20 AM      Component Value Range   Glucose-Capillary 142 (*) 70 - 99 (mg/dL)  GLUCOSE, CAPILLARY     Status: Abnormal   Collection Time  07/04/11  5:39 PM      Component Value Range   Glucose-Capillary 143 (*) 70 - 99 (mg/dL)    Studies/Results: No results found.  Medications:    . cloNIDine  0.1 mg Transdermal Weekly  . feeding supplement  237 mL Oral TID WC  . feeding supplement  1 Container Oral TID WC  . insulin aspart  0-9 Units Subcutaneous Q8H  . lip balm  1 application Topical BID  . magnesium sulfate 1 - 4 g bolus IVPB  3 g Intravenous Once  . metoprolol tartrate  12.5 mg Oral BID  . pantoprazole  40 mg Oral BID AC  . potassium chloride  10 mEq Intravenous Q1 Hr x 4  . DISCONTD: insulin aspart  0-9 Units Subcutaneous Q4H    acetaminophen, hydrALAZINE, loperamide, LORazepam, morphine injection, ondansetron (ZOFRAN) IV, oxyCODONE, phenol, sodium chloride     . sodium chloride 20 mL/hr at 07/02/11 0311  . fat emulsion 240 mL (07/03/11 1822)  . TPN (CLINIMIX) +/- additives 80 mL/hr at 07/04/11 1809  . TPN (CLINIMIX) +/- additives 80 mL/hr at 07/03/11 1821    Assessment/Plan:  Intra-abdominal abscess  Perc drain was,removedby IR 12/31 repeat CT showed improvement  continue pain control.   Diarrhea: Sec to short gut syndrome.   Abdominal pain: Stable.    Perforated peptic ulcer  S/p Ex-Lap with Lysis of adhesions, Antrectomy with duodenal resect. Billroth 2 reconstruction, omental patching of Duodenal bulb, Open Cholecystectomy, Gastrostomy and Jejunostomy on 11/08    Esophagitis, erosive  continue protonix   Diabetes Mellitus:  Continue SSI    Malnutrition  Started on po diet, Cont TNA till she gets repeat laprotomy .  Hypokalemia: Resolved.  Deconditioning/ Disposition:  Continue PT/OT . plans for SNF on discharge        LOS: 59 days   Juancarlos Crescenzo S 07/04/2011, 6:48 PM

## 2011-07-04 NOTE — Progress Notes (Signed)
Patient ID: [name], female   DOB: January 13, 1932, 78 y.o.   MRN: [id number] 58 Days Post-Op  Subjective: Pt says her diarrhea is a little better.  Eating very minimal amounts.   Objective: Vital signs in last 24 hours: Temp:  [98.7 F (37.1 C)-99.2 F (37.3 C)] 99.2 F (37.3 C) (01/05 0548) Pulse Rate:  [92-106] 92  (01/05 0548) Resp:  [18-20] 20  (01/05 0548) BP: (121-143)/(67-92) 121/67 mmHg (01/05 0548) SpO2:  [95 %-98 %] 95 % (01/05 0548) Last BM Date: 07/03/11  Intake/Output from previous day: 01/04 0701 - 01/05 0700 In: 1200 [P.O.:120; TPN:1080] Out: 1425 [Urine:1425] Intake/Output this shift:    PE: Abd: soft, minimally tender, some drainage from midline scar and old JP drain site.  g tube in place  Lab Results:   Basename 07/04/11 0600 07/03/11 0546  WBC 7.0 7.0  HGB 9.7* 10.0*  HCT 30.0* 30.6*  PLT 205 242   BMET  Basename 07/04/11 0600 07/02/11 0500  NA 130* 134*  K 3.7 3.5  CL 99 101  CO2 20 23  GLUCOSE 132* 137*  BUN 17 15  CREATININE 0.50 0.48*  CALCIUM 8.8 9.1   PT/INR No results found for this basename: LABPROT:2,INR:2 in the last 72 hours   Studies/Results: No results found.  Anti-infectives: Anti-infectives     Start     Dose/Rate Route Frequency Ordered Stop   06/03/11 2115   ciprofloxacin (CIPRO) IVPB 400 mg  Status:  Discontinued     Comments: Please mix with NS.      400 mg 200 mL/hr over 60 Minutes Intravenous Every 12 hours 06/03/11 2114 06/03/11 2121   05/30/11 1130   ciprofloxacin (CIPRO) IVPB 400 mg  Status:  Discontinued        400 mg 200 mL/hr over 60 Minutes Intravenous 2 times daily 05/30/11 1038 06/02/11 1618   05/29/11 2100   imipenem-cilastatin (PRIMAXIN) 500 mg in sodium chloride 0.9 % 100 mL IVPB  Status:  Discontinued        500 mg 200 mL/hr over 30 Minutes Intravenous Every 8 hours 05/29/11 2007 06/26/11 1702   05/29/11 2100   vancomycin (VANCOCIN) 1,250 mg in sodium chloride 0.9 % 250 mL IVPB  Status:   Discontinued        1,250 mg 166.7 mL/hr over 90 Minutes Intravenous Every 24 hours 05/29/11 2007 06/03/11 1006   05/07/11 1400   imipenem-cilastatin (PRIMAXIN) 500 mg in sodium chloride 0.9 % 100 mL IVPB        500 mg 200 mL/hr over 30 Minutes Intravenous 3 times per day 05/07/11 1106 05/21/11 1359   05/07/11 1200   micafungin (MYCAMINE) 100 mg in sodium chloride 0.9 % 100 mL IVPB  Status:  Discontinued     Comments: PER PHARMACY      100 mg 100 mL/hr over 1 Hours Intravenous Every 24 hours 05/07/11 1032 05/19/11 1103   05/07/11 1200   vancomycin (VANCOCIN) 1,250 mg in sodium chloride 0.9 % 250 mL IVPB  Status:  Discontinued        1,250 mg 166.7 mL/hr over 90 Minutes Intravenous Every 24 hours 05/07/11 1106 05/19/11 1103   05/07/11 1000   micafungin (MYCAMINE) 150 mg in sodium chloride 0.9 % 100 mL IVPB  Status:  Discontinued     Comments: PER PHARMACY      150 mg 100 mL/hr over 1 Hours Intravenous Daily 05/07/11 0951 05/07/11 1029   05/07/11 0300   ertapenem (INVANZ) 1  g in sodium chloride 0.9 % 50 mL IVPB        1 g 100 mL/hr over 30 Minutes Intravenous  Once 05/07/11 0300 05/07/11 0348   05/07/11 0130   cefTRIAXone (ROCEPHIN) 1 g in dextrose 5 % 50 mL IVPB        1 g 100 mL/hr over 30 Minutes Intravenous  Once 05/07/11 0122 05/07/11 0200           Assessment/Plan  1. 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 2. PCM/TNA  Plan: 1. Continue to try and let her eat po.  She is going to have diarrhea from this given her short gut.  Control diarrhea as best as possible with meds. 2. Continue TNA as a way to support her nutritionally. 3. Will need follow up with [name] whenever patient is ready for d/c to discuss revision of the gastroileostomy to help correct this problem.   LOS: 59 days    Jayliani Wanner E 07/04/2011

## 2011-07-05 LAB — CBC
Hemoglobin: 9.8 g/dL — ABNORMAL LOW (ref 12.0–15.0)
MCH: 28 pg (ref 26.0–34.0)
Platelets: 207 10*3/uL (ref 150–400)
RBC: 3.5 MIL/uL — ABNORMAL LOW (ref 3.87–5.11)
WBC: 6 10*3/uL (ref 4.0–10.5)

## 2011-07-05 LAB — BASIC METABOLIC PANEL
CO2: 20 mEq/L (ref 19–32)
Chloride: 100 mEq/L (ref 96–112)
Glucose, Bld: 131 mg/dL — ABNORMAL HIGH (ref 70–99)
Potassium: 4 mEq/L (ref 3.5–5.1)
Sodium: 132 mEq/L — ABNORMAL LOW (ref 135–145)

## 2011-07-05 LAB — PHOSPHORUS: Phosphorus: 4.6 mg/dL (ref 2.3–4.6)

## 2011-07-05 LAB — MAGNESIUM: Magnesium: 1.6 mg/dL (ref 1.5–2.5)

## 2011-07-05 LAB — GLUCOSE, CAPILLARY
Glucose-Capillary: 146 mg/dL — ABNORMAL HIGH (ref 70–99)
Glucose-Capillary: 125 mg/dL — ABNORMAL HIGH (ref 70–99)

## 2011-07-05 MED ORDER — MAGNESIUM SULFATE 40 MG/ML IJ SOLN
4.0000 g | Freq: Once | INTRAMUSCULAR | Status: AC
  Administered 2011-07-05: 4 g via INTRAVENOUS
  Filled 2011-07-05: qty 100

## 2011-07-05 MED ORDER — CLINIMIX/DEXTROSE (5/15) 5 % IV SOLN
INTRAVENOUS | Status: AC
  Administered 2011-07-05: 18:00:00 via INTRAVENOUS
  Filled 2011-07-05: qty 2000

## 2011-07-05 NOTE — Progress Notes (Signed)
Patient ID: [name], female   DOB: 1932/04/29, 78 y.o.   MRN: [id number] 59 Days Post-Op  Subjective: Pt without any complaints this morning.  Not very hungry but wishes she was.  Objective: Vital signs in last 24 hours: Temp:  [98.1 F (36.7 C)-99.4 F (37.4 C)] 98.1 F (36.7 C) (01/06 0634) Pulse Rate:  [85-89] 88  (01/06 0634) Resp:  [20] 20  (01/06 0634) BP: (120-123)/(72-76) 120/76 mmHg (01/06 0634) SpO2:  [96 %-97 %] 96 % (01/06 0634) Last BM Date: 07/03/11  Intake/Output from previous day: 01/05 0701 - 01/06 0700 In: -  Out: 3600 [Urine:3600] Intake/Output this shift:    PE: Abd: soft, decent amount of purulent appearing output from midline scar as well as some of the same drainage from old JP site in RUQ.  g tube intact JP drain intact with clear, slightly yellow-green tinge fluid.  Lab Results:   Basename 07/05/11 0500 07/04/11 0600  WBC 6.0 7.0  HGB 9.8* 9.7*  HCT 29.9* 30.0*  PLT 207 205   BMET  Basename 07/04/11 0600  NA 130*  K 3.7  CL 99  CO2 20  GLUCOSE 132*  BUN 17  CREATININE 0.50  CALCIUM 8.8   PT/INR No results found for this basename: LABPROT:2,INR:2 in the last 72 hours   Studies/Results: No results found.  Anti-infectives: Anti-infectives     Start     Dose/Rate Route Frequency Ordered Stop   06/03/11 2115   ciprofloxacin (CIPRO) IVPB 400 mg  Status:  Discontinued     Comments: Please mix with NS.      400 mg 200 mL/hr over 60 Minutes Intravenous Every 12 hours 06/03/11 2114 06/03/11 2121   05/30/11 1130   ciprofloxacin (CIPRO) IVPB 400 mg  Status:  Discontinued        400 mg 200 mL/hr over 60 Minutes Intravenous 2 times daily 05/30/11 1038 06/02/11 1618   05/29/11 2100   imipenem-cilastatin (PRIMAXIN) 500 mg in sodium chloride 0.9 % 100 mL IVPB  Status:  Discontinued        500 mg 200 mL/hr over 30 Minutes Intravenous Every 8 hours 05/29/11 2007 06/26/11 1702   05/29/11 2100   vancomycin (VANCOCIN) 1,250 mg in sodium  chloride 0.9 % 250 mL IVPB  Status:  Discontinued        1,250 mg 166.7 mL/hr over 90 Minutes Intravenous Every 24 hours 05/29/11 2007 06/03/11 1006   05/07/11 1400   imipenem-cilastatin (PRIMAXIN) 500 mg in sodium chloride 0.9 % 100 mL IVPB        500 mg 200 mL/hr over 30 Minutes Intravenous 3 times per day 05/07/11 1106 05/21/11 1359   05/07/11 1200   micafungin (MYCAMINE) 100 mg in sodium chloride 0.9 % 100 mL IVPB  Status:  Discontinued     Comments: PER PHARMACY      100 mg 100 mL/hr over 1 Hours Intravenous Every 24 hours 05/07/11 1032 05/19/11 1103   05/07/11 1200   vancomycin (VANCOCIN) 1,250 mg in sodium chloride 0.9 % 250 mL IVPB  Status:  Discontinued        1,250 mg 166.7 mL/hr over 90 Minutes Intravenous Every 24 hours 05/07/11 1106 05/19/11 1103   05/07/11 1000   micafungin (MYCAMINE) 150 mg in sodium chloride 0.9 % 100 mL IVPB  Status:  Discontinued     Comments: PER PHARMACY      150 mg 100 mL/hr over 1 Hours Intravenous Daily 05/07/11 0951 05/07/11 1029  05/07/11 0300   ertapenem (INVANZ) 1 g in sodium chloride 0.9 % 50 mL IVPB        1 g 100 mL/hr over 30 Minutes Intravenous  Once 05/07/11 0300 05/07/11 0348   05/07/11 0130   cefTRIAXone (ROCEPHIN) 1 g in dextrose 5 % 50 mL IVPB        1 g 100 mL/hr over 30 Minutes Intravenous  Once 05/07/11 0122 05/07/11 0200           Assessment/Plan  1. 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 Diarrhea, secondary to number 1 3. PCM/TNA  Plan: 1. Cont current care.  Pt is relatively stable right now.  She says her diarrhea is actually somewhat improved. 2. Cont to encourage some po intake if pt will eat; otherwise, cont TNA for full nutritional support.  In my experience, SNF will not accept patients with TNA.  She may need LTAC, unless there is a sNF that is willing to accept her on TNA.  She is otherwise stable for now.   LOS: 60 days    [name] E 07/05/2011

## 2011-07-05 NOTE — Progress Notes (Signed)
Patient seen and examined, loose bowel movements  due to short gut syndrome.  Objective: Vital signs in last 24 hours: Temp:  [98.1 F (36.7 C)-99.4 F (37.4 C)] 98.1 F (36.7 C) (01/06 0634) Pulse Rate:  [85-89] 88  (01/06 0634) Resp:  [20] 20  (01/06 0634) BP: (120-123)/(72-76) 120/76 mmHg (01/06 0634) SpO2:  [96 %-97 %] 96 % (01/06 0634) Weight change:  Last BM Date: 07/03/11  Intake/Output from previous day: 01/05 0701 - 01/06 0700 In: -  Out: 3600 [Urine:3600]     Physical Exam: General: Alert, awake, in no acute distress.  Heart: Regular rate and rhythm, without murmurs  Lungs: Clear to auscultation bilaterally.  Abdomen: Soft, mildly tender, nondistended, positive bowel sounds.  Extremities: No clubbing cyanosis or edema with positive pedal pulses.  Neuro: Grossly intact, nonfocal     Lab Results: Results for orders placed during the hospital encounter of 05/06/11 (from the past 24 hour(s))  GLUCOSE, CAPILLARY     Status: Abnormal   Collection Time   07/04/11  5:39 PM      Component Value Range   Glucose-Capillary 143 (*) 70 - 99 (mg/dL)  GLUCOSE, CAPILLARY     Status: Abnormal   Collection Time   07/04/11  9:21 PM      Component Value Range   Glucose-Capillary 152 (*) 70 - 99 (mg/dL)   Comment 1 Notify RN    CBC     Status: Abnormal   Collection Time   07/05/11  5:00 AM      Component Value Range   WBC 6.0  4.0 - 10.5 (K/uL)   RBC 3.50 (*) 3.87 - 5.11 (MIL/uL)   Hemoglobin 9.8 (*) 12.0 - 15.0 (g/dL)   HCT 16.1 (*) 09.6 - 46.0 (%)   MCV 85.4  78.0 - 100.0 (fL)   MCH 28.0  26.0 - 34.0 (pg)   MCHC 32.8  30.0 - 36.0 (g/dL)   RDW 04.5 (*) 40.9 - 15.5 (%)   Platelets 207  150 - 400 (K/uL)  BASIC METABOLIC PANEL     Status: Abnormal   Collection Time   07/05/11  5:00 AM      Component Value Range   Sodium 132 (*) 135 - 145 (mEq/L)   Potassium 4.0  3.5 - 5.1 (mEq/L)   Chloride 100  96 - 112 (mEq/L)   CO2 20  19 - 32 (mEq/L)   Glucose, Bld 131 (*) 70 - 99  (mg/dL)   BUN 17  6 - 23 (mg/dL)   Creatinine, Ser 8.11 (*) 0.50 - 1.10 (mg/dL)   Calcium 9.1  8.4 - 91.4 (mg/dL)   GFR calc non Af Amer >90  >90 (mL/min)   GFR calc Af Amer >90  >90 (mL/min)  MAGNESIUM     Status: Normal   Collection Time   07/05/11  5:00 AM      Component Value Range   Magnesium 1.6  1.5 - 2.5 (mg/dL)  PHOSPHORUS     Status: Normal   Collection Time   07/05/11  5:00 AM      Component Value Range   Phosphorus 4.6  2.3 - 4.6 (mg/dL)  GLUCOSE, CAPILLARY     Status: Abnormal   Collection Time   07/05/11  5:52 AM      Component Value Range   Glucose-Capillary 146 (*) 70 - 99 (mg/dL)    Studies/Results: No results found.  Medications:    . cloNIDine  0.1 mg Transdermal Weekly  .  feeding supplement  237 mL Oral TID WC  . feeding supplement  1 Container Oral TID WC  . insulin aspart  0-9 Units Subcutaneous Q8H  . lip balm  1 application Topical BID  . magnesium sulfate 1 - 4 g bolus IVPB  3 g Intravenous Once  . magnesium sulfate 1 - 4 g bolus IVPB  4 g Intravenous Once  . metoprolol tartrate  12.5 mg Oral BID  . pantoprazole  40 mg Oral BID AC  . potassium chloride  10 mEq Intravenous Q1 Hr x 4    acetaminophen, hydrALAZINE, loperamide, LORazepam, morphine injection, ondansetron (ZOFRAN) IV, oxyCODONE, phenol, sodium chloride     . sodium chloride 20 mL/hr at 07/05/11 0500  . fat emulsion 240 mL (07/03/11 1822)  . TPN (CLINIMIX) +/- additives 80 mL/hr at 07/04/11 1809  . TPN (CLINIMIX) +/- additives    . TPN (CLINIMIX) +/- additives 80 mL/hr at 07/03/11 1821    Assessment/Plan:  Intra-abdominal abscess  Perc drain was,removedby IR 12/31 repeat CT showed improvement  continue pain control with oxycodone.  Diarrhea: Sec to short gut syndrome.   Abdominal pain: Stable.   Perforated peptic ulcer  S/p Ex-Lap with Lysis of adhesions, Antrectomy with duodenal resect. Billroth 2 reconstruction, omental patching of Duodenal bulb, Open Cholecystectomy,  Gastrostomy and Jejunostomy on 11/08    Esophagitis, erosive  continue protonix   Diabetes Mellitus:  Continue SSI    Malnutrition  Started on po diet, Cont TNA till she gets repeat laprotomy .  Hypokalemia: Resolved.  Deconditioning/ Disposition:  Continue PT/OT .  plans for SNF on discharge        LOS: 60 days   [name] S 07/05/2011, 11:41 AM

## 2011-07-05 NOTE — Progress Notes (Signed)
No changes for now.

## 2011-07-05 NOTE — Progress Notes (Signed)
PARENTERAL NUTRITION CONSULT NOTE - FOLLOW UP  Pharmacy Consult for TPN Indication: TF intolerance  Patient Measurements: Height: 5\' 6"  (167.6 cm) Weight: 155 lb 6.8 oz (70.5 kg) IBW/kg (Calculated) : 59.3   Vital Signs: Temp: 98.1 F (36.7 C) (01/06 0634) Temp src: Oral (01/06 0634) BP: 120/76 mmHg (01/06 0634) Pulse Rate: 88  (01/06 0634) Intake/Output from previous day: 01/05 0701 - 01/06 0700 In: -  Out: 3600 [Urine:3600] Intake/Output from this shift:    Labs:  Basename 07/05/11 0500 07/04/11 0600 07/03/11 0546  WBC 6.0 7.0 7.0  HGB 9.8* 9.7* 10.0*  HCT 29.9* 30.0* 30.6*  PLT 207 205 242  APTT -- -- --  INR -- -- --     Basename 07/05/11 0500 07/04/11 0600 07/03/11 0546  NA 132* 130* --  K 4.0 3.7 --  CL 100 99 --  CO2 20 20 --  GLUCOSE 131* 132* --  BUN 17 17 --  CREATININE 0.49* 0.50 --  LABCREA -- -- --  CREAT24HRUR -- -- --  CALCIUM 9.1 8.8 --  MG 1.6 1.7 1.4*  PHOS 4.6 5.1* 4.4  PROT -- -- --  ALBUMIN -- -- --  AST -- -- --  ALT -- -- --  ALKPHOS -- -- --  BILITOT -- -- --  BILIDIR -- -- --  IBILI -- -- --  PREALBUMIN -- -- --  TRIG -- -- --  CHOLHDL -- -- --  CHOL -- -- --   Estimated Creatinine Clearance: 55.1 ml/min (by C-G formula based on Cr of 0.49).    Basename 07/05/11 0552 07/04/11 2121 07/04/11 1739  GLUCAP 146* 152* 143*   Insulin Requirements in the past 24 hours:  4 units SSI since 1800 yesterday 50 units insulin in TPN CBGs well controlled  Current Nutrition:  Clinimix 5/15 at 67ml/hr providing 96gm protein  Assessment: 79 yof on TPN for erosive esophagitis with perforated pyloric ulcer s/p lap. S/p gastrostomy, gastrectomy and cholecystectomy 11/7. TPN initially started 05/12/11-05/13/11. Resumed TPN on 11/20 for increase J-tube output and J-tube displacement. 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 TPN ongoing d/t poor PO intake, changed to continuous 24 hour infusion d/t low Na. PO intake is inconsistent but is taking some  PO. 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 Plan to continue TPN until f/u surgery. Likely will DC to SNF on TPN.   Lytes: Phos slowly trending down, currently at ULN.  Monitor to keep Ca/Phos product <55. Mag low - will replete today.  Nutritional Goals:  1875-2000 kCal, 96-110 grams of protein per day  Plan:  1.  Continue no lytes from formula: clinimix 5/15 at 73ml/hr with lipids at 37ml/hr, MVI and trace elements only on MWF d/t national backorder 2. Replete mag 3.  F/u am tna labs.  [name] L. Illene Bolus, PharmD, BCPS Clinical Pharmacist Pager: [phone] 07/05/2011 10:45 AM

## 2011-07-06 LAB — COMPREHENSIVE METABOLIC PANEL
ALT: 22 U/L (ref 0–35)
Albumin: 2.2 g/dL — ABNORMAL LOW (ref 3.5–5.2)
Calcium: 9 mg/dL (ref 8.4–10.5)
GFR calc Af Amer: >90 mL/min (ref >90)
Glucose, Bld: 136 mg/dL — ABNORMAL HIGH (ref 70–99)
Potassium: 3.8 mEq/L (ref 3.5–5.1)
Sodium: 131 mEq/L — ABNORMAL LOW (ref 135–145)
Total Protein: 6.5 g/dL (ref 6.0–8.3)

## 2011-07-06 LAB — DIFFERENTIAL
Basophils Absolute: 0 10*3/uL (ref 0.0–0.1)
Lymphocytes Relative: 45 % (ref 12–46)
Lymphs Abs: 2.9 10*3/uL (ref 0.7–4.0)
Monocytes Absolute: 0.6 10*3/uL (ref 0.1–1.0)
Neutro Abs: 2.8 10*3/uL (ref 1.7–7.7)

## 2011-07-06 LAB — CHOLESTEROL, TOTAL: Cholesterol: 118 mg/dL (ref 0–200)

## 2011-07-06 LAB — MAGNESIUM: Magnesium: 1.6 mg/dL (ref 1.5–2.5)

## 2011-07-06 LAB — CBC
Hemoglobin: 10.1 g/dL — ABNORMAL LOW (ref 12.0–15.0)
MCH: 27.8 pg (ref 26.0–34.0)
MCHC: 32.9 g/dL (ref 30.0–36.0)
RDW: 15.5 % (ref 11.5–15.5)

## 2011-07-06 LAB — PREALBUMIN: Prealbumin: 15 mg/dL — ABNORMAL LOW (ref 17.0–34.0)

## 2011-07-06 LAB — TRIGLYCERIDES: Triglycerides: 112 mg/dL (ref <150)

## 2011-07-06 LAB — GLUCOSE, CAPILLARY: Glucose-Capillary: 130 mg/dL — ABNORMAL HIGH (ref 70–99)

## 2011-07-06 MED ORDER — HEPARIN SOD (PORK) LOCK FLUSH 100 UNIT/ML IV SOLN
250.0000 [IU] | INTRAVENOUS | Status: AC | PRN
  Administered 2011-07-06: 250 [IU]

## 2011-07-06 MED ORDER — PANTOPRAZOLE SODIUM 40 MG PO TBEC: 40.0000 mg | DELAYED_RELEASE_TABLET | Freq: Two times a day (BID) | ORAL | Status: DC

## 2011-07-06 MED ORDER — FLUCONAZOLE 200 MG PO TABS: 200.0000 mg | ORAL_TABLET | Freq: Every day | ORAL | Status: AC

## 2011-07-06 MED ORDER — METOPROLOL TARTRATE 12.5 MG HALF TABLET: 12.5000 mg | ORAL_TABLET | Freq: Two times a day (BID) | ORAL | Status: DC

## 2011-07-06 MED ORDER — FAT EMULSION 20 % IV EMUL: 240.0000 mL | INTRAVENOUS | Status: DC

## 2011-07-06 MED ORDER — CLONIDINE HCL 0.1 MG/24HR TD PTWK: 1.0000 | MEDICATED_PATCH | TRANSDERMAL | Status: DC

## 2011-07-06 MED ORDER — OXYCODONE HCL 5 MG/5ML PO SOLN: 5.0000 mg | ORAL | Status: AC | PRN

## 2011-07-06 MED ORDER — LOPERAMIDE HCL 2 MG PO CAPS: 2.0000 mg | ORAL_CAPSULE | ORAL | Status: AC | PRN

## 2011-07-06 MED ORDER — BOOST / RESOURCE BREEZE PO LIQD: 1.0000 | Freq: Three times a day (TID) | ORAL | Status: DC

## 2011-07-06 MED ORDER — FAT EMULSION 20 % IV EMUL
240.0000 mL | INTRAVENOUS | Status: DC
  Filled 2011-07-06: qty 250

## 2011-07-06 MED ORDER — INSULIN ASPART 100 UNIT/ML ~~LOC~~ SOLN: 0.0000 [IU] | Freq: Three times a day (TID) | SUBCUTANEOUS | Status: DC

## 2011-07-06 MED ORDER — TRACE MINERALS CR-CU-MN-SE-ZN 10-1000-500-60 MCG/ML IV SOLN
INTRAVENOUS | Status: DC
  Filled 2011-07-06: qty 2000

## 2011-07-06 NOTE — Progress Notes (Signed)
PT Cancellation Note  Treatment cancelled today due to patient's refusal to participate.  [name] 07/06/2011, 10:52 AM

## 2011-07-06 NOTE — Progress Notes (Signed)
60 Days Post-Op  Subjective: Pt ok. C/o LUQ pains, not severe. NO N/V but not much appetite.  Objective: Vital signs in last 24 hours: Temp:  [98.7 F (37.1 C)-99.7 F (37.6 C)] 98.7 F (37.1 C) (01/07 0639) Pulse Rate:  [77-85] 85  (01/07 0639) Resp:  [20] 20  (01/07 0639) BP: (123-152)/(77-84) 152/84 mmHg (01/07 0639) SpO2:  [97 %-99 %] 97 % (01/07 0639) Last BM Date: 07/03/11  Intake/Output this shift: Total I/O In: 120 [P.O.:120] Out: -   Physical Exam: BP 152/84  Pulse 85  Temp(Src) 98.7 F (37.1 C) (Oral)  Resp 20  Ht 5\' 6"  (1.676 m)  Wt 70.5 kg (155 lb 6.8 oz)  BMI 25.09 kg/m2  SpO2 97% Abdomen: stable.  G-tube intact Midline wound healing well except small punctate area in superior aspect that continues to drain some mucous, this may be chronic sinus tract or small stitch abscess, but no evidence of fistula seen on CT/UGI studies. Drain intact- scant output  Labs: CBC  Basename 07/06/11 0600 07/05/11 0500  WBC 6.5 6.0  HGB 10.1* 9.8*  HCT 30.7* 29.9*  PLT 243 207   BMET  Basename 07/06/11 0600 07/05/11 0500  NA 131* 132*  K 3.8 4.0  CL 99 100  CO2 24 20  GLUCOSE 136* 131*  BUN 18 17  CREATININE 0.53 0.49*  CALCIUM 9.0 9.1   LFT  Basename 07/06/11 0600  PROT 6.5  ALBUMIN 2.2*  AST 19  ALT 22  ALKPHOS 78  BILITOT 0.3  BILIDIR --  IBILI --  LIPASE --   PT/INR No results found for this basename: LABPROT:2,INR:2 in the last 72 hours ABG No results found for this basename: PHART:2,PCO2:2,PO2:2,HCO3:2 in the last 72 hours  Studies/Results: No results found.  Assessment: Principal Problem:  *Intra-abdominal abscess Active Problems:  DIABETES MELLITUS, TYPE II  HYPERTENSION  Anemia  Hyponatremia  Abdominal pain, acute  Metabolic acidosis  Esophagitis, erosive  Acute respiratory failure   Procedure(s): EXPLORATORY LAPAROTOMY GASTROSTOMY GASTRECTOMY CHOLECYSTECTOMY  Plan: Continue TNA Probably needs LTAC as she will be on  long term TNA until repeat surgery.  LOS: 61 days    Marianna Fuss 07/06/2011

## 2011-07-06 NOTE — Progress Notes (Signed)
OT Note:  Pt declined working with OT, stated she had pain in her stomach and didn't feel well.  Assisted pt in notifying RN of pain with use of call button.  Instructed pt in importance of working with therapies and out of bed activity in the hospital and at [facility].  Will continue to follow. 07/06/2011 Martie Round, OTR/L Pager: [phone]

## 2011-07-06 NOTE — Discharge Summary (Addendum)
[name], 78 y.o., DOB April 02, 1932, MRN [id number]. Admission date: 05/06/2011 Discharge Date 07/06/2011 Primary MD [name], MD, MD Admitting Physician No admitting provider for patient encounter.  Admission Diagnosis  Dehydration [276.51] Hypochloremia [276.9] Hyponatremia [276.1] Pancreatitis [577.0] UTI (lower urinary tract infection) [599.0] Perforated ulcer [533.50] Headache Free air abdomen  Discharge Diagnosis . Septic shock -resolved  2. S/p VDRF  3. Pneumoperitoneum due to perforated peptic ulcer  4. S/p Ex-Lap with Lysis of adhesions, Antrectomy with duodenal resection, Billroth 2 reconstruction, omental patching of Duodenal bulb, Open Cholecystectomy, Gastrostomy and Jejunostomy on 11/08  5. Epigastric abscess s/p percutaneous drain per IR 12/02  6. Severe Protein calorie malnutrition  7. Diabetes Mellitus  8. Dementia  9. 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 Hypertension  12. Depression 13. 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 Hypertension   . Reflux   . Right elbow pain     OTIF  . Dementia   . Depression   . Osteoarthritis   . Pancreatitis 11/2007    HOP  . Angiomyolipoma of kidney     right  . Ulcerative esophagitis 12/05/2007    hx elevated gastrin, severe on EGD by Dr Jena Gauss , h pylori negative  . Hiatal hernia   . S/P colonoscopy 2009    pt reports normal by Dr Lovell Sheehan  . Diabetes mellitus   . Anemia   . Hyponatremia     Past Surgical History  Procedure Date  . Orif right hip 1999    [facility]  . Umbilical hernia repair 19 months old    [place]  . Esophagogastroduodenoscopy 01/28/2011    Procedure: ESOPHAGOGASTRODUODENOSCOPY (EGD);  Surgeon: Arlyce Harman, MD;  Location: [facility];  Service: Endoscopy;  Laterality: N/A;  . Colonoscopy 01/28/2011    Procedure: COLONOSCOPY;  Surgeon: Arlyce Harman, MD;  Location: [facility];  Service: Endoscopy;   Laterality: N/A;  . Laparotomy 02/04/2011    Procedure: EXPLORATORY LAPAROTOMY;  Surgeon: Fabio Bering;  Location: [facility] ORS;  Service: General;  Laterality: N/A;  . Laparotomy 05/07/2011    Procedure: EXPLORATORY LAPAROTOMY;  Surgeon: Ardeth Sportsman, MD;  Location: [facility] OR;  Service: General;  Laterality: N/A;  lysis of adhesions  . Gastrostomy 05/07/2011    Procedure: GASTROSTOMY;  Surgeon: Ardeth Sportsman, MD;  Location: [facility] OR;  Service: General;  Laterality: N/A;  g-tube / j -tube placement  . Gastrectomy 05/07/2011    Procedure: GASTRECTOMY;  Surgeon: Ardeth Sportsman, MD;  Location: [facility] OR;  Service: General;  Laterality: N/A;  Partial gastrectomy  . Cholecystectomy 05/07/2011    Procedure: CHOLECYSTECTOMY;  Surgeon: Ardeth Sportsman, MD;  Location: [facility] OR;  Service: General;  Laterality: N/A;  open     Brief History and Physical: HPI: 78 y/o female presented to ER [facility] yesterday c/o Headache. She has a history of multiple visits. Noted to c/o abdominal pain. CT was obtained last night showing fluid and free air consistant with a perforation. She was transferred to [facility] and CCM service this AM in shock and worsening peritonitis.  She had an ex lap 08Aug2012 at [facility] or UGI bleeding and was found to have an gastric antral/pancreatic mass. They were concerned about a cancer, but biopsies were negative. She was transferred to [facility] and had embolization of the gastroduodenel artery, stabilized , and discharged home after recovering rather well later in August.  Hospital Course    Recent bowel perforation and s/p surgery : [name] is a 79/f admitted to ICU with Septic shock, respiratory failure and acute pneumoperitoneum / perforated peptic ulcer , subsequently underwent emergent surgery secondary to recent bowel perforation and s/p surgery : Exp. Lap, lysis of adhesions, antrectomy,proximal and duodenal resection, bilroth II, gastrojejunostomy anastomosis, Omental patch of duodenal bulb, gastrostomy,  jejunostomy. 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  Then her course was complicated by development of an intra-abdominal abscess, Meropenem was added back to her regimen on 11/30.  Subsequently she had a percutaneous drain placed by IR on 12/02.  Patient was started on TPN, and later started on Po liquid diet, at this time she is tolerating her po diet well. She is on low residue diet. For Intraabdominal abscess she has completed 28 days of antibiotics, and now she is off the antibiotics. Patient developed  Developed short gut syndrome due to gastroileostomy, and will need revision laprotomy as per surgery. She continued to be managed with Imodium. Patient will follow up with Dr Michaell Cowing surgery in 10 days to discuss the revision surgery. At this time she will be continued on TPN as well till she gets her repeat surgery.   Pharmacy recommends: Clinimix 5/15 at 23ml/hr and lipids 10 ml/hr on Mon/Wed/Fri providing average of ~1570 kcal and 96 gm protein per day.  Acute Respiratory failure; s/p VDRF Resolved, ETT was placed from 11/8>>> 11/18.   Diabetes Mellitus: Continue sliding scale Insulin.  Hypertension: Continue catapress, Metoprolol. BP is stable.  Diarrhea: Sec to short gut syndrome. Stool for C diff PCR is negative.  UTI Patient had pseudomonas growing from urine, which was treated with meropenem. Repeat culture is growing yeast , will treat with diflucan for seven days.    Consults   Surgery  CCM Infectious Disease  Radiology report: CT Guided abscess drain  Significant Tests:  See full reports for all details   05/31/2011 *RADIOLOGY REPORT* Clinical Data:  Intra-abdominal sub xyphoid abscess postoperatively CT GUIDED INTRA-ABDOMINAL ABSCESS DRAINAGE Date: 05/31/2011 11:23:00 Radiologist: M. Ruel Favors, M.D. Medications: 2 mg Versed, 100 mcg Fentanyl Guidance: CT Fluoroscopy time: None. Sedation time: 18 minutes Contrast volume: None. Complications: No immediate PROCEDURE/FINDINGS: Informed consent was obtained from the patient following explanation of the procedure, risks, benefits and alternatives. The patient understands, agrees and consents for the procedure. All questions were addressed. A time out was performed. Maximal barrier sterile technique utilized including caps, mask, sterile gowns, sterile gloves, large sterile drape, hand hygiene, and betadine Previous imaging was reviewed. The midline sub xyphoid intra- abdominal abscess was localized with the patient supine utilizing noncontrast CT. Under sterile conditions and local anesthesia, an 18 gauge 10 cm access needle was advanced from an anterior oblique approach into the fluid collection. Syringe aspiration yielded purulent fluid consistent with an abscess. Guide wire advanced. Guide wire position confirmed with CT. Tract dilatation performed to advance a 12-French drain with the retention loop formed in the cavity. Syringe aspiration yielded 20 ml of thick purulent fluid consistent with an abscess. Sample sent for Gram stain and culture. Catheter secured with a Prolene suture and connected to external suction bulb. Sterile dressing applied.  IMPRESSION: Successful CT guided sub xyphoid intra-abdominal abscess drain placement. Original Report Authenticated By: Judie Petit. Ruel Favors, M.D.  Ct Abdomen Pelvis W Contrast  05/30/2011 *RADIOLOGY REPORT* Clinical Data: Recent exploratory laparotomy, large pyloric ulcer with perforated. CT ABDOMEN AND PELVIS WITH CONTRAST Technique: Multidetector CT imaging of the abdomen and pelvis was performed following the standard protocol during bolus administration of intravenous  contrast. Contrast: OMNIPAQUE IOHEXOL 300 MG/ML IV SOLN Comparison: CT 05/07/2011 Findings: There is a gastrostomy tube within the stomach. Postsurgical change consistent with partial gastrectomy. There is a percutaneous drain in the right abdominal wall which extends to the left upper quadrant inferior to the stomach. A second surgical drain extends inferiorly and superiorly with tip in the left upper quadrant adjacent to the spleen. There is a small fluid collection adjacent to the left lateral margin of the liver which measures 7.1 x 4.0 cm. This is adjacent the left lesser curvature of the stomach and does have a thin rim of enhancement.(image 26). This is similar to prior the collection measures 6.4 x 4.9 cm. There is a 3.7 x 4.2 cm collection which has a small amount interspersed gas at the surgical site along the left hepatic margin. This is decreased from 4.5 x 4.6 cm on prior. There is additional fluid collection lateral to the stomach measuring 2.5 x 3.2 cm (image 31) which is increased from 1.7 x 3.0 cm. There is a tiny 6 mm focus of gas, lateral to the duodenum, which is clearly seen on prior (image 34). There is mild bibasilar atelectasis and small effusions. No pericardial fluid. No focal hepatic lesion. The gallbladder is absent. Pancreas is normal. The spleen is normal. Adrenal glands and kidneys are normal. No evidence of bowel obstruction. There is diverticula the sigmoid colon. Abdominal aorta normal caliber. No free fluid the pelvis. Foley catheter in the bladder. Uterus is normal. Review of bone windows demonstrates no aggressive osseous lesions. IMPRESSION: 1. Fluid collection with interspersed gas along the surgical site posterior to the left hepatic lobe is decreased slightly in size. 2. Fluid collection lateral to the left hepatic lobe is not changed. 3. Fluid collection lateral to the stomach is slightly increased in size. 4. Tiny focus of gas and at the porta hepatis is not clearly  seen on prior. Original Report Authenticated By: Genevive Bi, M.D.  Ct Abdomen Pelvis W Contrast  05/18/2011 *RADIOLOGY REPORT* Clinical Data: Recent of pill Roth to surgery. Intra-abdominal abscess. CT ABDOMEN AND PELVIS WITH CONTRAST Technique: Multidetector CT imaging of the abdomen and pelvis was performed following the standard protocol during bolus administration of intravenous contrast. Contrast: OMNIPAQUE IOHEXOL 300 MG/ML IV SOLN Comparison: 05/07/2011 Findings: Small bilateral pleural effusions with overlying atelectasis noted. No pericardial effusion. No focal liver abnormality noted. No splenic abnormalities identified. The adrenal glands both appear within normal limits. 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 A cyst is noted within the upper pole of the left kidney measuring 0.6 cm. There is no upper abdominal adenopathy. No pelvic adenopathy noted. The urinary bladder is collapsed around a Foley catheter balloon. Within the upper abdomen there are postoperative changes consistent with the overall to surgery. A percutaneous gastrostomy tube is in place. Additionally there is a left abdominal percutaneous jejunostomy tube. A surgical drainage catheter is identified which enters the right abdomen and crosses the midline to the left upper quadrant of the abdomen. There has been cholecystectomy. The proximal small bowel loops are increased  in caliber measuring up to 4.9 cm. There is enteric contrast material identified within the colon up to the level of the splenic flexure. There is a moderate amount of fluid identified within the upper abdomen. At this time there is no mature, enhancing wall surrounding the upper abdominal fluid to suggest abscess. Review of the visualized osseous structures is significant for lumbar degenerative disc disease and right total hip arthroplasty device. IMPRESSION: 1. No discrete peripherally enhancing fluid collections within  the upper abdomen to suggest abscess. There is a moderate amount of fluid within the upper abdomen which is nonspecific in the early postoperative period. 2. Postop changes consistent with Billroth II gastric surgery. There is nonspecific increased caliber of the proximal small bowel which may represent postoperative ileus. 3. Cholecystectomy. Original Report Authenticated By: Rosealee Albee, M.D.  Dg Chest Port 1 View  05/29/2011 *RADIOLOGY REPORT* Clinical Data: Febrile. Low oxygen saturations. PORTABLE CHEST - 1 VIEW Comparison: Chest radiograph 05/19/2011 Findings: Right upper extremity PICC terminates in the distal superior vena cava. Heart size within normal limits portable technique. Mediastinal contour stable. Lung volumes are low bilaterally with linear atelectasis at the right lung base. There is peribronchial thickening. No visible airspace disease or pleural effusion. Osteopenia. IMPRESSION: Low lung volumes with right basilar atelectasis and peribronchial thickening. Original Report Authenticated By: Britta Mccreedy, M.D.  Dg Chest Port 1 View  05/19/2011 *RADIOLOGY REPORT* Clinical Data: Check endotracheal tube PORTABLE CHEST - 1 VIEW Comparison: 05/17/2011 Findings: Cardiomediastinal silhouette is stable. Endotracheal tube has been removed. No diagnostic pneumothorax. Mild basilar atelectasis or infiltrate. Stable right arm PICC line position. No pulmonary edema. IMPRESSION: Endotracheal tube has been removed. No diagnostic pneumothorax. Mild basilar atelectasis or infiltrate. Stable right arm PICC line position. No pulmonary edema. Original Report Authenticated By: Natasha Mead, M.D.  Dg Chest Port 1 View  05/17/2011 *RADIOLOGY REPORT* Clinical Data: ET tube placement PORTABLE CHEST - 1 VIEW Comparison: 05/16/2011 Findings: Endotracheal tube terminates 2.5 cm above the carina. Low lung volumes with bibasilar atelectasis and small left pleural effusion. No pneumothorax. IMPRESSION: Endotracheal tube  terminates 2.5 cm above the carina. Low lung volumes with bibasilar atelectasis and small left pleural effusion. Original Report Authenticated By: Charline Bills, M.D.  Dg Chest Port 1 View  05/16/2011 *RADIOLOGY REPORT* Clinical Data: ET tube placement. PORTABLE CHEST - 1 VIEW Comparison: Chest 05/14/2011. Findings: Endotracheal tube is in place with tip 2 cm above the carina. Left IJ catheter has been removed. There is a new right PICC with the tip in the lower superior vena cava. Left worse than right basilar airspace disease persists but appears improved somewhat. No pneumothorax is identified. Heart size is normal. IMPRESSION: 1. ET tube tip is 2 cm above the carina. 2. New right PICC in good position. 3. Some improvement in bibasilar airspace opacities. Original Report Authenticated By: Bernadene Bell. Maricela Curet, M.D.  Dg Chest Port 1 View  05/14/2011 *RADIOLOGY REPORT* Clinical Data: Evaluate ET tube placement PORTABLE CHEST - 1 VIEW Comparison: 05/13/2011 Findings: The endotracheal tube tip is above the carina. There is a left IJ catheter with tip in the SVC. Stable cardiac enlargement. Lung volumes are low and there is interstitial edema bilaterally. Pleural effusions are also noted. IMPRESSION: 1. Low lung volumes and congestive heart failure. 2. Stable position of ET tube. Original Report Authenticated By: Rosealee Albee, M.D.  Dg Chest Port 1 View  05/13/2011 *RADIOLOGY REPORT* Clinical Data: Respiratory failure. PORTABLE CHEST - 1 VIEW Comparison: 05/12/2011 Findings: Endotracheal tube  and left jugular vein catheter appear in good position, unchanged. Bibasilar atelectasis and possible left effusion are essentially unchanged. IMPRESSION: No significant change. Bibasilar atelectasis and possible left effusion. Original Report Authenticated By: Gwynn Burly, M.D.  Dg Chest Portable 1 View  05/12/2011 *RADIOLOGY REPORT* Clinical Data: Endotracheal tube placement PORTABLE CHEST - 1 VIEW  Comparison: May 11, 2011 Findings: Endotracheal tube is in grossly good position with tip several centimeters above the carina. No change is noted in position of left internal jugular catheter line. Increased left lower lobe opacity is noted most consistent with pleural effusion although underlying pneumonia or atelectasis cannot be excluded. IMPRESSION: Worsening left lower lobe opacity most consistent with pleural effusion and possible underlying pneumonia or atelectasis. Original Report Authenticated By: Venita Sheffield., M.D.  Dg Chest Portable 1 View  05/11/2011 *RADIOLOGY REPORT* Clinical Data: ET tube placement. PORTABLE CHEST - 1 VIEW Comparison: Portable chest 05/09/2011. Findings: The patient's endotracheal tube has been pulled back with the tip now in good position, 3 cm above the carina. Support apparatus is otherwise unchanged with fatty left chest tube noted. There is no pneumothorax. Left effusion and basilar airspace disease have improved. Mild atelectasis in the right base is noted. Cardiomegaly. IMPRESSION: 1. ET tube in good position. 2. Decreased left effusion and basilar airspace disease. Original Report Authenticated By: Bernadene Bell. D'ALESSIO, M.D.  Dg Chest Portable 1 View  05/09/2011 *RADIOLOGY REPORT* Clinical Data: Repositioned endotracheal tube PORTABLE CHEST - 1 VIEW Comparison: 05/09/2011; 05/07/2011 Findings: Unchanged enlarged cardiac silhouette and mediastinal contours. No significant interval change in endotracheal tube positioning with tip remains approximately 1 cm from the carina. Grossly unchanged position of left jugular approach and venous catheter tip overlying the superior cavoatrial junction. No changed minimally improved aeration of the right lung base. The pulmonary vasculature remains indistinct. Grossly unchanged hazy left basilar opacity likely representing a layering pleural effusion. Grossly unchanged left basilar heterogeneous / consolidative opacities. No  definite pneumothorax. Unchanged bones. IMPRESSION: 1. No interval change in endotracheal tube positioning with tip again approximate 1 cm from the carina. Otherwise, unchanged support apparatus. No definite pneumothorax. 2. Minimally improved aeration of the right lung base with persistent findings of mild pulmonary edema and layering left sided effusion and left basilar heterogeneous / consolidative opacities, possibly atelectasis though underlying infection is not excluded. Original Report Authenticated By: Waynard Reeds, M.D.  Dg Chest Portable 1 View  05/09/2011 *RADIOLOGY REPORT* Clinical Data: Follow up respiratory failure PORTABLE CHEST - 1 VIEW Comparison: 05/07/2011 Findings: ET tube remains low at the right mainstem bronchus orientation. Recommend pulling back by 2-3 cm. Left IJ catheter tip is in the SVC. Interval increase in bilateral pleural effusions and interstitial edema. IMPRESSION: 1. Increase in CHF pattern. 2. ET tube remains too low. Original Report Authenticated By: Rosealee Albee, M.D.  Dg Abd Portable 1v  05/17/2011 *RADIOLOGY REPORT* Clinical Data: Abdominal distention ABDOMEN - 1 VIEW Comparison: 05/12/2011 Findings: Drains are again noted over the abdomen. Minimal retained contrast noted in nondilated cecum. Clips overlie the left of midline. Presence or absence of air fluid levels or free air cannot be assessed on this single supine view. No gas filled dilated loop of bowel is identified. There is minimal possible free air beneath the left hemidiaphragm as seen on prior exams. Right total hip arthroplasty again noted. IMPRESSION: No supine evidence for gaseous bowel distention. Original Report Authenticated By: Harrel Lemon, M.D.  Dg Abd Portable 1v  05/12/2011 *RADIOLOGY REPORT*  Clinical Data: Abdominal distention ABDOMEN - 1 VIEW Comparison: 05/07/2011 Findings: There is a gastrostomy tube identified within the left upper quadrant of the abdomen. The surgical drainage  catheters are noted with tips in the projection of the left upper quadrant the abdomen. The bowel gas pattern appears nonobstructed. No dilated loops of small bowel or air-fluid levels. IMPRESSION: 1. There are no specific features identified to suggest bowel obstruction. Original Report Authenticated By: Rosealee Albee, M.D.  Dg [name] W/water Sol Cm  05/25/2011 *RADIOLOGY REPORT* Clinical Data:Bilious drainage per G tube status post gastric antrectomy and partial duodenectomy with Billroth II anastomosis for perforated ulcer 05/07/2011. Evaluate patency of anastomosis. WATER SOLUBLE UPPER GI SERIES Technique: Single-column upper GI series was performed using water soluble contrast. Fluoroscopy Time: 1.46 minutes. Contrast: 120 ml Omnipaque-300 per percutaneous gastrostomy tube. Comparison: CT 05/18/2011. Findings: The scout abdominal radiograph demonstrates a percutaneous gastrostomy and jejunostomy. There is a small amount of residual enteric contrast in the colon. Water-soluble contrast administered per the G tube demonstrates it to be well positioned with rapid filling of the proximal stomach. There is rapid emptying into what was presumed to represent the efferent loop. Small bowel contrast appears to rapidly extend into the right lower quadrant and fill the lumen of the cecum. The segment of the small bowel between the stomach and cecum appears to be significantly shorter than that suggested on CT, implying possible development of a fistulous communication. No extravasation is identified. No opacification of the afferent loop was achieved. There was significant reflux into the esophagus to the level of the cricopharyngeus while turning the patient. IMPRESSION: 1. Percutaneous gastrostomy tube appears well positioned. There is no extravasation. 2. 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 As  correlated with the recent CT, the efferent loop is not clearly opacified but did appear to be patent on CT. Follow-up CT may be helpful to better define this anatomy. 3. Significant gastroesophageal reflux. Original Report Authenticated By: Gerrianne Scale, M.D.    Dg Abd 1 View  06/18/2011  *RADIOLOGY REPORT*  Clinical Data: Abdominal pain.  ABDOMEN - 1 VIEW  Comparison: Multiple priors.  Findings: Pigtail drainage catheter left upper quadrant appears unchanged.  Barium is seen throughout the colon.  There is a right total hip replacement.  There is no visible obstruction or free air.  IMPRESSION: No acute findings.  Original Report Authenticated By: Elsie Stain, M.D.   Ct Abdomen Pelvis W Contrast  06/26/2011  *RADIOLOGY REPORT*  Clinical Data: Intra-abdominal abscess  CT ABDOMEN AND PELVIS WITH CONTRAST  Technique:  Multidetector CT imaging of the abdomen and pelvis was performed following the standard protocol during bolus administration of intravenous contrast.  Contrast: 80mL OMNIPAQUE IOHEXOL 300 MG/ML IV SOLN  Comparison: 06/15/2011  Findings: Trace bilateral pleural effusions with associated lower lobe atelectasis.  Mild cardiomegaly with a small pericardial effusion.  Coronary atherosclerosis.  Liver, spleen, and adrenal glands are within normal limits.  Pancreas is notable for a subcentimeter hypodense lesion in the pancreatic tail (series 2/image 30).  Stable hypodense bilateral renal lesions.  No hydronephrosis.  Gastrostomy tube.  Status post Billroth II.  No evidence of bowel obstruction.  Normal appendix.  Colonic diverticulosis, without associated inflammatory changes.  Atherosclerotic calcifications of the abdominal aorta and branch vessels.  Near complete resolution of fluid collection anterior to the lateral left hepatic lobe, measuring approximately  3.8 x 0.9 cm (series 2/image 25), with indwelling pigtail drain.  Additional 2.0 x 2.9 cm fluid collection just medial to the left  hepatic lobe is improved (series 2/image 31).  No suspicious abdominopelvic lymphadenopathy.  Peroneal dialysis catheter.  Uterus and bilateral ovaries are unremarkable.  Bladder is decompressed by indwelling Foley catheter.  Degenerative changes of the visualized thoracolumbar spine.  Right total hip arthroplasty without evidence of hardware complication.  IMPRESSION: Near complete resolution of fluid collection anterior to the left hepatic level with indwelling pigtail drain, now measuring 3.8 x 0.9 cm.  Additional 2.0 x 2.9 cm fluid collection medial to the left hepatic lobe, improved.  Stable ancillary findings as above.  Original Report Authenticated By: Charline Bills, M.D.   Ct Abdomen Pelvis W Contrast  06/15/2011  *RADIOLOGY REPORT*  Clinical Data:  Post Billroth II, intra abdominal abscess, hypertension, dementia, anemia, acute abdominal pain  CT ABDOMEN AND PELVIS WITH CONTRAST  Technique:  Multidetector CT imaging of the abdomen and pelvis was performed following the standard protocol during bolus administration of intravenous contrast. Sagittal and coronal MPR images reconstructed from axial data set.  Contrast:  95 ml Omnipaque-300 IV, dilute oral contrast  Comparison: 06/08/2011  Findings: Fall Bibasilar atelectasis and small effusions. Gastrostomy tube in stomach. Percutaneous drainage catheter within the abscess collection in the anterior upper abdomen, with little change in size of of surrounding fluid collection, previously 3.8 x 1.5 cm, currently 5.0 x 1.0 cm. Additional small collection adjacent to the medial margin of the left lobe of the liver, 2.6 x 3.2 cm, not significantly changed.  Diffuse stranding of upper abdominal tissue planes little changed. Tiny low attenuation focus within the pancreas 7 mm diameter, unchanged since earlier CT 05/29/2011. Liver, spleen, remainder of pancreas, kidneys, and adrenal glands normal appearance. Decreased gastric wall thickening versus previous  study. Peritoneal dialysis catheter. Beam hardening artifacts from right hip prosthesis. Sigmoid diverticulosis.  Atrophic uterus and ovaries. Bowel loops otherwise unremarkable. Foley catheter decompresses urinary bladder. No mass, adenopathy, or new focal abscess collection. Scattered atherosclerotic calcifications. Bones diffusely demineralized. Poorly defined areas of infiltration within subcutaneous fat of the anterior abdominal wall likely reflect sites of medication injection.  IMPRESSION: The two fluid collections seen previously in the upper abdomen are little changed in overall size as impaired the previous study. Diffuse stranding of tissue planes in the upper abdomen without new/additional discrete abscess collection. Decreased wall thickening of the gastric remnant. 7 mm low attenuation nodule within the pancreas question cystic pancreatic tumor. Bibasilar effusions and atelectasis.  Sigmoid diverticulosis without evidence of diverticulitis.  Original Report Authenticated By: Lollie Marrow, M.D.   Ct Abdomen Pelvis W Contrast  06/08/2011  *RADIOLOGY REPORT*  Clinical Data: Evaluate fluid collections, intra-abdominal abscess post percutaneous drainage  CT ABDOMEN AND PELVIS WITH CONTRAST  Technique:  Multidetector CT imaging of the abdomen and pelvis was performed following the standard protocol during bolus administration of intravenous contrast. Sagittal and coronal MPR images reconstructed from axial data set.  Contrast: 80mL OMNIPAQUE IOHEXOL 300 IV; Dilute contrast by G-tube.  Comparison: 05/29/2011, 05/31/2011  Findings: Small pericardial effusion. Minimal bibasilar effusions and atelectasis. Gallbladder surgically absent. Liver, spleen, and adrenal glands normal. Tiny bilateral renal cysts and probable tiny left renal calculi, nonobstructing. Atrophic pancreas with parenchymal calcifications of body. Prior antrectomy with gastrojejunostomy and gastrostomy tube. Gastric remnant demonstrates a  mildly thickened wall, nonspecific, question artifact underdistension versus edema. Postsurgical infiltrative changes in the upper abdomen.  Percutaneous drainage catheter  identified adjacent to the lateral segment left lobe liver, associated fluid collection decreased in size, residual 3.8 x 1.5 cm. Additional fluid collection lateral to the stomach, 2.4 x 2.3 x 5.2 cm, previously 3.2 x 2.6 x 6.4 cm. Third fluid collection identified medial to the lateral segment left lobe liver, 3.3 x 2.7 cm, previously 4.2 x 3.7 cm at same level. No new intra abdominal fluid collections identified.  Beam hardening artifacts in pelvis from right hip prosthesis. Scattered diverticula descending and sigmoid colon. Foley catheter decompresses urinary bladder. Atrophic uterus with unremarkable ovaries and appendix. No mass, adenopathy, or free air. Long catheter within abdomen, question peritoneal dialysis catheter. Scattered atherosclerotic calcifications. Areas subcutaneous infiltration or nodularity in the left abdominal wall greater than right likely related to medication injection sites. Bones are diffusely demineralized.  IMPRESSION: Decrease in sizes of the upper abdominal fluid collections since previous exam.  Wall thickening of gastric remnant though this could be an artifact from underdistension. Small pericardial effusion. Descending and sigmoid colonic diverticulosis. Bibasilar atelectasis and minimal pleural effusions.  Original Report Authenticated By: Lollie Marrow, M.D.   Dg [name] W/water Sol Cm  07/02/2011  *RADIOLOGY REPORT*  Clinical Data: History of Billroth II procedure for peptic ulcer. Prior upper GI demonstrate rapid emptying from the stomach with opacification of the cecum.  Evaluate for small bowel fistula or short gut.  UPPER GIWITH WATER SOLUTION CONTRAST  Technique: Upper GI series performed with water soluble contrast.  Comparison: Upper GI of 05/25/2011.  CT of 05/27/2011.  Findings: Pre procedure scout  film demonstrates right hip arthroplasty.  Moderate stool within the rectum.  Injection of a total of 150 ml of Omnipaque-300 into the gastrostomy tube.  Normal postoperative appearance of the stomach. Gastroesophageal reflux again identified.  Promptly arising from the medial side of the stomach, at approximately the 8 o'clock position, contrast fills a small bowel loop which is likely in the mid to distal small bowel.  This promptly fills the cecum/terminal ileum.  Subsequently, primarily with RPO positioning, a small bowel loop fills from the gastric remnant at approximately the 6 o'clock position.  Example series 58.  This also is likely the mid to distal small bowel, promptly filling the cecum.  Postprocedure overhead film demonstrates no free perforation or extravasation of contrast.  IMPRESSION: 2 foci of communication between the residual stomach and small bowel.  The more inferior communication is felt to represent the gastrojejunostomy site.  Anastomosis is likely within the mid to distal small bowel, given relative small amount of remaining bowel between the stomach and terminal ileum.  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  This communication is also to the mid to distal small bowel, promptly filling the cecum.  Gastroesophageal reflux.  Original Report Authenticated By: Consuello Bossier, M.D.     Today   Subjective:   Albana Saperstein today has no headache, no abdominal pain. Objective:   Blood pressure 152/84, pulse 85, temperature 98.7 F (37.1 C), temperature source Oral, resp. rate 20, height 5\' 6"  (1.676 m), weight 70.5 kg (155 lb 6.8 oz), SpO2 97.00%.  Intake/Output Summary (Last 24 hours) at 07/06/11 1059 Last data filed at 07/06/11 0900  Gross per 24 hour  Intake    120 ml  Output   2500 ml  Net  -2380 ml    Exam Awake Alert,  No new F.N deficits, Normal affect Ramsey.AT,PERRAL Supple Neck,No JVD, No cervical lymphadenopathy appriciated.    Symmetrical Chest wall  movement, Good air movement bilaterally, CTAB RRR,No Gallops,Rubs or new Murmurs, No Parasternal Heave +ve B.Sounds, Abd Soft, Non tender, No organomegaly appriciated, No rebound -guarding or rigidity.g tube intact, JP drain . No Cyanosis, Clubbing or edema, No new Rash or bruise  Data Review   Cultures -  Results for orders placed during the hospital encounter of 05/06/11  SURGICAL PCR SCREEN     Status: Abnormal   Collection Time   05/07/11  9:30 AM      Component Value Range Status Comment   MRSA, PCR NEGATIVE  NEGATIVE  Final    Staphylococcus aureus POSITIVE (*) NEGATIVE  Final   URINE CULTURE     Status: Normal   Collection Time   05/07/11 10:20 AM      Component Value Range Status Comment   Specimen Description URINE, RANDOM   Final    Special Requests NONE   Final    Setup Time [id number]   Final    Colony Count NO GROWTH   Final    Culture NO GROWTH   Final    Report Status 05/08/2011 FINAL   Final   CULTURE, BLOOD (ROUTINE X 2)     Status: Normal   Collection Time   05/07/11 11:20 AM      Component Value Range Status Comment   Specimen Description BLOOD   Final    Special Requests     Final    Value: BOTTLES DRAWN AEROBIC AND ANAEROBIC 5CC RT SUBCLAVIAN VEIN   Setup Time [id number]   Final    Culture NO GROWTH 5 DAYS   Final    Report Status 05/13/2011 FINAL   Final   CULTURE, BLOOD (ROUTINE X 2)     Status: Normal   Collection Time   05/07/11 11:20 AM      Component Value Range Status Comment   Specimen Description BLOOD   Final    Special Requests     Final    Value: BOTTLES DRAWN AEROBIC AND ANAEROBIC 5CC RT SUBCLAVIAN VEIN   Setup Time [id number]   Final    Culture NO GROWTH 5 DAYS   Final    Report Status 05/13/2011 FINAL   Final   CULTURE, RESPIRATORY     Status: Normal   Collection Time   05/13/11  6:02 AM      Component Value Range Status Comment   Specimen Description ENDOTRACHEAL   Final    Special Requests NONE   Final     Gram Stain     Final    Value: FEW WBC PRESENT, PREDOMINANTLY PMN     RARE SQUAMOUS EPITHELIAL CELLS PRESENT     NO ORGANISMS SEEN   Culture NO GROWTH 2 DAYS   Final    Report Status 05/15/2011 FINAL   Final   URINE CULTURE     Status: Normal   Collection Time   05/30/11  5:34 AM      Component Value Range Status Comment   Specimen Description URINE, CATHETERIZED   Final    Special Requests NONE   Final    Setup Time [id number]   Final    Colony Count >=100,000 COLONIES/ML   Final    Culture     Final    Value: PSEUDOMONAS AERUGINOSA     ESCHERICHIA COLI   Report Status 06/02/2011 FINAL   Final    Organism ID, Bacteria PSEUDOMONAS AERUGINOSA   Final    Organism ID, Bacteria ESCHERICHIA COLI  Final   CULTURE, ROUTINE-ABSCESS     Status: Normal   Collection Time   05/31/11  1:29 PM      Component Value Range Status Comment   Specimen Description ABSCESS ABDOMEN RUQ   Final    Special Requests PATIENT ON FOLLOWING VANC, CIRPO, PRIMAXIN   Final    Gram Stain     Final    Value: NO WBC SEEN     NO SQUAMOUS EPITHELIAL CELLS SEEN     NO ORGANISMS SEEN   Culture NO GROWTH 3 DAYS   Final    Report Status 06/03/2011 FINAL   Final   URINE CULTURE     Status: Normal   Collection Time   07/01/11 11:06 PM      Component Value Range Status Comment   Specimen Description URINE, RANDOM   Final    Special Requests NONE   Final    Setup Time [id number]   Final    Colony Count >=100,000 COLONIES/ML   Final    Culture YEAST   Final    Report Status 07/02/2011 FINAL   Final   CLOSTRIDIUM DIFFICILE BY PCR     Status: Normal   Collection Time   07/02/11  1:04 AM      Component Value Range Status Comment   C difficile by pcr NEGATIVE  NEGATIVE  Final     CBC w Diff: Lab Results  Component Value Date   WBC 6.5 07/06/2011   HGB 10.1* 07/06/2011   HCT 30.7* 07/06/2011   PLT 243 07/06/2011   LYMPHOPCT 45 07/06/2011   MONOPCT 10 07/06/2011   EOSPCT 3 07/06/2011   BASOPCT 1 07/06/2011   CMP: Lab  Results  Component Value Date   NA 131* 07/06/2011   K 3.8 07/06/2011   CL 99 07/06/2011   CO2 24 07/06/2011   BUN 18 07/06/2011   CREATININE 0.53 07/06/2011   PROT 6.5 07/06/2011   ALBUMIN 2.2* 07/06/2011   BILITOT 0.3 07/06/2011   ALKPHOS 78 07/06/2011   AST 19 07/06/2011   ALT 22 07/06/2011  .  Micro Results Recent Results (from the past 240 hour(s))  URINE CULTURE     Status: Normal   Collection Time   07/01/11 11:06 PM      Component Value Range Status Comment   Specimen Description URINE, RANDOM   Final    Special Requests NONE   Final    Setup Time [id number]   Final    Colony Count >=100,000 COLONIES/ML   Final    Culture YEAST   Final    Report Status 07/02/2011 FINAL   Final   CLOSTRIDIUM DIFFICILE BY PCR     Status: Normal   Collection Time   07/02/11  1:04 AM      Component Value Range Status Comment   C difficile by pcr NEGATIVE  NEGATIVE  Final      Discharge Instructions      Follow-up Information    Follow up with GROSS,STEVEN C., MD in 10 days.   Contact information:   [employer] [address] [place] [address] [phone]          Discharge Medications   TPN Clinimix E 5/15 ,regular insulin 50 units infusion @ 80 ml./hr Diflucan 200 mg po daily x 7 days  Medication List  As of 07/06/2011 10:59 AM   START taking these medications         cloNIDine 0.1 mg/24hr  patch   Commonly known as: CATAPRES - Dosed in mg/24 hr   Place 1 patch (0.1 mg total) onto the skin once a week.      fat emulsion 20 % infusion   Inject 240 mLs into the vein continuous.      * feeding supplement Liqd   Take 1 Container by mouth 3 (three) times daily with meals.      insulin aspart 100 UNIT/ML injection   Commonly known as: novoLOG   Inject 0-9 Units into the skin every 8 (eight) hours.      loperamide 2 MG capsule   Commonly known as: IMODIUM   Take 1 capsule (2 mg total) by mouth as needed for diarrhea or loose stools.      metoprolol  tartrate 12.5 mg Tabs   Commonly known as: LOPRESSOR   Take 0.5 tablets (12.5 mg total) by mouth 2 (two) times daily.      oxyCODONE 5 MG/5ML solution   Commonly known as: ROXICODONE   Take 5 mLs (5 mg total) by mouth every 4 (four) hours as needed.      pantoprazole 40 MG tablet   Commonly known as: PROTONIX   Take 1 tablet (40 mg total) by mouth 2 (two) times daily before a meal.     * Notice: This list has 1 medication(s) that are the same as other medications prescribed for you. Read the directions carefully, and ask your doctor or other care provider to review them with you.       CONTINUE taking these medications         acetaminophen 500 MG tablet   Commonly known as: TYLENOL   Take 1 tablet (500 mg total) by mouth 3 (three) times daily as needed for pain.      amLODipine 5 MG tablet   Commonly known as: NORVASC   Take 1 tablet (5 mg total) by mouth daily.      benazepril 20 MG tablet   Commonly known as: LOTENSIN      butalbital-acetaminophen-caffeine 50-325-40 MG per tablet   Commonly known as: FIORICET, ESGIC   Take 1-2 tablets by mouth every 8 (eight) hours as needed for headache.      calcium-vitamin D 500-200 MG-UNIT per tablet   Commonly known as: OSCAL WITH D      darifenacin 15 MG 24 hr tablet   Commonly known as: ENABLEX               * Ensure Plus Liqd   Take 1 Can by mouth daily.      metFORMIN 1000 MG tablet   Commonly known as: GLUCOPHAGE      multivitamin tablet      ondansetron 4 MG tablet   Commonly known as: ZOFRAN      QUEtiapine 25 MG tablet   Commonly known as: SEROQUEL     * Notice: This list has 1 medication(s) that are the same as other medications prescribed for you. Read the directions carefully, and ask your doctor or other care provider to review them with you.       STOP taking these medications         fish oil-omega-3 fatty acids 1000 MG capsule      HYDROcodone-acetaminophen 5-325 MG per tablet      polyethylene  glycol packet          Where to get your medications    These are the prescriptions that you need to pick  up. We sent them to a specific pharmacy, so you will need to go there to get them.   [facility] - [place], [place] - [address]    [address] [place] [address]    Phone: [phone]        insulin aspart 100 UNIT/ML injection         You may get these medications from any pharmacy.         cloNIDine 0.1 mg/24hr patch   fat emulsion 20 % infusion   feeding supplement Liqd   loperamide 2 MG capsule   metoprolol tartrate 12.5 mg Tabs   oxyCODONE 5 MG/5ML solution   pantoprazole 40 MG tablet             Total Time in preparing paper work, data evaluation and todays exam - 35 minutes  Harlem Thresher S M.D on 07/06/2011 at 10:59 AM  [facility] Office  [phone]

## 2011-07-06 NOTE — Progress Notes (Signed)
Ms. Tutton is being discharged to [facility] skilled nursing facility today. Discharge information forwarded to facility. CSW facilitating discharge to [facility] by ambulance. Patient's daughter was called and message left regarding discharge.  Genelle Bal, MSW, LCSW [phone]

## 2011-07-06 NOTE — Progress Notes (Signed)
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  Patient Measurements: Height: 5\' 6"  (167.6 cm) Weight: 155 lb 6.8 oz (70.5 kg) IBW/kg (Calculated) : 59.3   Vital Signs: Temp: 98.7 F (37.1 C) (01/07 0639) Temp src: Oral (01/07 0639) BP: 152/84 mmHg (01/07 0639) Pulse Rate: 85  (01/07 0639) Intake/Output from previous day: 01/06 0701 - 01/07 0700 In: -  Out: 2500 [Urine:2500] Intake/Output from this shift:    Labs:  Basename 07/06/11 0600 07/05/11 0500 07/04/11 0600  WBC 6.5 6.0 7.0  HGB 10.1* 9.8* 9.7*  HCT 30.7* 29.9* 30.0*  PLT 243 207 205  APTT -- -- --  INR -- -- --     Basename 07/06/11 0600 07/05/11 0500 07/04/11 0600  NA 131* 132* 130*  K 3.8 4.0 3.7  CL 99 100 99  CO2 24 20 20   GLUCOSE 136* 131* 132*  BUN 18 17 17   CREATININE 0.53 0.49* 0.50  LABCREA -- -- --  CREAT24HRUR -- -- --  CALCIUM 9.0 9.1 8.8  MG 1.6 1.6 1.7  PHOS 4.2 4.6 5.1*  PROT 6.5 -- --  ALBUMIN 2.2* -- --  AST 19 -- --  ALT 22 -- --  ALKPHOS 78 -- --  BILITOT 0.3 -- --  BILIDIR -- -- --  IBILI -- -- --  PREALBUMIN -- -- --  TRIG 112 -- --  CHOLHDL -- -- --  CHOL 118 -- --   Estimated Creatinine Clearance: 55.1 ml/min (by C-G formula based on Cr of 0.53).    Basename 07/06/11 0607 07/05/11 2105 07/05/11 1722  GLUCAP 128* 142* 125*   Insulin Requirements in the past 24 hours:  2 units SSI since 1800 yesterday 50 units insulin in TPN CBGs well controlled  Current Nutrition:  Clinimix 5/15 at 10ml/hr and lipids 10 ml/hr on Mon/Wed/Fri providing average of ~1570 kcal and 96 gm protein per day.  Assessment: 79 yof on TPN for erosive esophagitis with perforated pyloric ulcer s/p lap. S/p gastrostomy, gastrectomy and cholecystectomy 11/7. TPN initially started 05/12/11-05/13/11. Resumed TPN on 11/20 for increase J-tube output and J-tube displacement. 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 Pt taking small amount of po. 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 If pt doesn't tolerate po intake, will probably need to cycle TPN for discharge  Lytes: WNL.   Nutritional Goals:  1750-1950 kCal, 95-110 grams of protein per day  Plan:  1.  Add lytes back to formula clinimix E 5/15 at 27ml/hr with lipids at 31ml/hr, MVI and trace elements only on MWF d/t national backorder 2. F/u po intake and ability to start weaning TPN 3. F/u am labs and pending prealbumin.  Christoper Fabian, PharmD, BCPS Clinical Pharmacist Pager: [phone] 07/06/2011 8:27 AM

## 2011-07-06 NOTE — Progress Notes (Signed)
Patient discharged via ambulance to [facility].  Patient alert to self only.  Double lumen PICC intact to LUE, JP drain intact to right lower abdomen, PEG tube intact to left upper abdomen.  Dry dressing intact to midline chest incision.  Will continue to monitor.

## 2011-07-06 NOTE — Progress Notes (Signed)
Studies reviewed.  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  Will most likely need surgery to resect since it has not closed down by now.  Pt's malnutrition a major risk factor to recurrence with Alb in low 2's.  It needs to be up in the 3's at least (was 4.1 last year before 1st surgery in [place]).  Also, needs at least 3 months (if not 6 months) to minimize hostility of abd adhesions with re-laparotomy.  Probable pre-op endoscopy to help map out the original loop gastrojejunostomy as well to see if revision of the G-J definitely needed also.

## 2011-07-06 NOTE — Progress Notes (Signed)
[name] is unchanged.  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  Would plan on treating with tna, can eat as tolerated.  She will follow up with [name] in office for what will likely be another operation.  Will leave RUQ drain in now due to prior ct and take out in office.

## 2011-07-20 ENCOUNTER — Ambulatory Visit (INDEPENDENT_AMBULATORY_CARE_PROVIDER_SITE_OTHER): Payer: Medicare HMO | Admitting: Surgery

## 2011-07-20 ENCOUNTER — Encounter (INDEPENDENT_AMBULATORY_CARE_PROVIDER_SITE_OTHER): Payer: Self-pay | Admitting: Surgery

## 2011-07-20 VITALS — BP 116/88 | HR 96 | Temp 98.0°F | Resp 24 | Ht 66.5 in | Wt 162.0 lb

## 2011-07-20 DIAGNOSIS — A048 Other specified bacterial intestinal infections: Secondary | ICD-10-CM

## 2011-07-20 DIAGNOSIS — K316 Fistula of stomach and duodenum: Secondary | ICD-10-CM

## 2011-07-20 DIAGNOSIS — L98499 Non-pressure chronic ulcer of skin of other sites with unspecified severity: Secondary | ICD-10-CM

## 2011-07-20 DIAGNOSIS — B9681 Helicobacter pylori [H. pylori] as the cause of diseases classified elsewhere: Secondary | ICD-10-CM

## 2011-07-20 HISTORY — DX: Fistula of stomach and duodenum: K31.6

## 2011-07-20 NOTE — Patient Instructions (Signed)
Continue TNA/TPN IV nutrition  Flush G tube 2x / day  Will  needd exploration of abdomen with takedown of gastroileal fistula in March/April 2013 when nutrition & rehab maximized

## 2011-07-20 NOTE — Progress Notes (Signed)
Subjective:     Patient ID: [name], female   DOB: 12/09/1931, 78 y.o.   MRN: [id number]  HPI  [name]  [birth year] [id number]  Patient Care Team: [name], MD as PCP - General Fabio Bering, MD as Consulting Physician (General Surgery) Arlyce Harman, MD as Consulting Physician (Gastroenterology) Ardeth Sportsman, MD as Consulting Physician (General Surgery) Terald Sleeper, MD as Attending Physician (Internal Medicine)  This patient is a 78 y.o.female who presents today for surgical evaluation.   Diagnosis: Perforation of prepyloric ulcer  Procedure: Exploratory laparotomy. Cholecystectomy. Antrectomy and resection of duodenal bulb. A Billroth II reconstruction with gastrojejunostomy looped. Gastrostomy tube jejunostomy tube 05/07/2011.  The patient comes today from [facility]. A worker has brought her in. There is no family. The patient is eating some solid food. No severe diarrhea. No nausea or vomiting. On parenteral T&A nutrition. She accidentally pulled her drain out. It was not replaced. No fevers or chills. Minimal abdominal discomfort. Patient Active Problem List  Diagnoses  . DIABETES MELLITUS, TYPE II  . HYPERLIPIDEMIA  . Obesity, unspecified  . DEPRESSION  . HYPERTENSION  . DIVERTICULOSIS OF COLON  . ACUTE CYSTITIS  . HEMATOMA  . Anemia  . Hyponatremia  . H pylori ulcer  . Ulcer, colon  . Acute renal failure  . S/P exploratory laparotomy  . Fever, postprocedural  . Confusion  . Abdominal pain, acute  . Metabolic acidosis  . Esophagitis, erosive  . Acute respiratory failure  . Intra-abdominal abscess    Past Medical History  Diagnosis Date  . Hypertension   . Reflux   . Right elbow pain     OTIF  . Dementia   . Depression   . Osteoarthritis   . Pancreatitis 11/2007    HOP  . Angiomyolipoma of kidney     right  . Ulcerative esophagitis 12/05/2007    hx elevated gastrin, severe on EGD by Dr Jena Gauss , h pylori negative  .  Hiatal hernia   . S/P colonoscopy 2009    pt reports normal by Dr Lovell Sheehan  . Diabetes mellitus   . Anemia   . Hyponatremia   . Cellulitis   . Esophagitis     Past Surgical History  Procedure Date  . Orif right hip 1999    [facility]  . Umbilical hernia repair 60 months old    [place]  . Esophagogastroduodenoscopy 01/28/2011    Procedure: ESOPHAGOGASTRODUODENOSCOPY (EGD);  Surgeon: Arlyce Harman, MD;  [facility];  Service: Endoscopy;  Laterality: N/A;  . Colonoscopy 01/28/2011    Procedure: COLONOSCOPY;  Surgeon: Arlyce Harman, MD;  Location: [facility];  Service: Endoscopy;  Laterality: N/A;  . Laparotomy 02/04/2011    Procedure: EXPLORATORY LAPAROTOMY;  Surgeon: Fabio Bering;  Location: [facility] ORS;  Service: General;  Laterality: N/A;  . Laparotomy 05/07/2011    Procedure: EXPLORATORY LAPAROTOMY;  Surgeon: Ardeth Sportsman, MD;  Location: [facility] OR;  Service: General;  Laterality: N/A;  lysis of adhesions  . Gastrostomy 05/07/2011    Procedure: GASTROSTOMY;  Surgeon: Ardeth Sportsman, MD;  Location[facility] OR;  Service: General;  Laterality: N/A;  g-tube / j -tube placement  . Gastrectomy 05/07/2011    Procedure: GASTRECTOMY;  Surgeon: Ardeth Sportsman, MD;  Location: [facility] OR;  Service: General;  Laterality: N/A;  Partial gastrectomy  . Cholecystectomy 05/07/2011    Procedure: CHOLECYSTECTOMY;  Surgeon: Ardeth Sportsman, MD;  Location: [facility] OR;  Service: General;  Laterality: N/A;  open    History   Social History  . Marital Status: Widowed    Spouse Name: N/A    Number of Children: 1  . Years of Education: N/A   Occupational History  . retired     Social History Main Topics  . Smoking status: Never Smoker   . Smokeless tobacco: Current User    Types: Chew  . Alcohol Use: No     Hx of Alcohol dependecy   . Drug Use: No  . Sexually Active: No   Other Topics Concern  . Not on file   Social History Narrative  . No narrative on file    Family History  Problem Relation Age  of Onset  . Cancer Mother     pelvic     Current outpatient prescriptions:acetaminophen (TYLENOL) 500 MG tablet, Take 1 tablet (500 mg total) by mouth 3 (three) times daily as needed for pain., Disp: 60 tablet, Rfl: 0;  amLODipine (NORVASC) 5 MG tablet, Take 1 tablet (5 mg total) by mouth daily., Disp: 30 tablet, Rfl: 3;  benazepril (LOTENSIN) 20 MG tablet, Take 20 mg by mouth daily.  , Disp: , Rfl:  butalbital-acetaminophen-caffeine (FIORICET) 50-325-40 MG per tablet, Take 1-2 tablets by mouth every 8 (eight) hours as needed for headache., Disp: 20 tablet, Rfl: 0;  calcium-vitamin D (OSCAL WITH D) 500-200 MG-UNIT per tablet, Take 1 tablet by mouth 2 (two) times daily. , Disp: , Rfl: ;  cloNIDine (CATAPRES - DOSED IN MG/24 HR) 0.1 mg/24hr patch, Place 1 patch (0.1 mg total) onto the skin once a week., Disp: 4 patch, Rfl: 0 darifenacin (ENABLEX) 15 MG 24 hr tablet, Take 15 mg by mouth daily.  , Disp: , Rfl: ;  Ensure Plus (ENSURE PLUS) LIQD, Take 1 Can by mouth daily., Disp: 30 Can, Rfl: 2;  fat emulsion 20 % infusion, Inject 240 mLs into the vein continuous., Disp: 250 mL, Rfl: 0;  feeding supplement (RESOURCE BREEZE) LIQD, Take 1 Container by mouth 3 (three) times daily with meals., Disp: 10 Container, Rfl: 0 insulin aspart (NOVOLOG) 100 UNIT/ML injection, Inject 0-9 Units into the skin every 8 (eight) hours., Disp: 1 vial, Rfl: 0;  metFORMIN (GLUCOPHAGE) 1000 MG tablet, Take 1,000 mg by mouth 2 (two) times daily with a meal.  , Disp: , Rfl: ;  metoprolol tartrate (LOPRESSOR) 12.5 mg TABS, Take 0.5 tablets (12.5 mg total) by mouth 2 (two) times daily., Disp: 30 tablet, Rfl: 0 Multiple Vitamin (MULTIVITAMIN) tablet, Take 1 tablet by mouth daily.  , Disp: , Rfl: ;  ondansetron (ZOFRAN) 4 MG tablet, Take 4 mg by mouth every 8 (eight) hours as needed. For nausea  , Disp: , Rfl: ;  pantoprazole (PROTONIX) 40 MG tablet, Take 1 tablet (40 mg total) by mouth 2 (two) times daily before a meal., Disp: 30 tablet,  Rfl: 0;  QUEtiapine (SEROQUEL) 25 MG tablet, Take 25 mg by mouth at bedtime.  , Disp: , Rfl:   Allergies  Allergen Reactions  . Other Swelling    Pinto beans cause swelling and hives  . Penicillins Swelling    BP 116/88  Pulse 96  Temp(Src) 98 F (36.7 C) (Temporal)  Resp 24  Ht 5' 6.5" (1.689 m)  Wt 162 lb (73.483 kg)  BMI 25.76 kg/m2     Review of Systems  Constitutional: Positive for fatigue. Negative for fever, chills, diaphoresis and appetite change.  HENT: Negative for ear pain, sore throat  and trouble swallowing.   Eyes: Negative for photophobia and visual disturbance.  Respiratory: Negative for cough and choking.   Cardiovascular: Negative for chest pain and palpitations.  Gastrointestinal: Negative for nausea, vomiting, abdominal pain, constipation, blood in stool, abdominal distention, anal bleeding and rectal pain.  Genitourinary: Negative for dysuria, frequency and difficulty urinating.  Musculoskeletal: Positive for myalgias and arthralgias. Negative for gait problem.  Skin: Negative for color change, pallor and rash.  Neurological: Negative for dizziness, speech difficulty, weakness and numbness.  Hematological: Negative for adenopathy.  Psychiatric/Behavioral: Negative for confusion and agitation. The patient is not nervous/anxious.        Objective:   Physical Exam  Constitutional: She is oriented to person, place, and time. She appears well-developed. She is cooperative. She does not have a sickly appearance. She does not appear ill. No distress.  HENT:  Head: Normocephalic.  Mouth/Throat: Oropharynx is clear and moist. No oropharyngeal exudate.  Eyes: Conjunctivae and EOM are normal. Pupils are equal, round, and reactive to light. No scleral icterus.  Neck: Normal range of motion. No tracheal deviation present.  Cardiovascular: Normal rate and intact distal pulses.   Pulmonary/Chest: Effort normal. No respiratory distress. She exhibits no tenderness.    Abdominal: Soft. She exhibits no distension and no mass. There is no tenderness. Hernia confirmed negative in the right inguinal area and confirmed negative in the left inguinal area.       Incisions clean with normal healing ridges.  No hernias  Gtube LUQ flushes OK  All drains out - scant granulation at openings  Genitourinary: No vaginal discharge found.  Musculoskeletal: Normal range of motion. She exhibits no tenderness.  Lymphadenopathy:       Right: No inguinal adenopathy present.       Left: No inguinal adenopathy present.  Neurological: She is oriented to person, place, and time. No cranial nerve deficit. She exhibits normal muscle tone. Coordination normal.       Prefers sitting in wheelchair  Skin: Skin is warm and dry. No rash noted. She is not diaphoretic.  Psychiatric: She has a normal mood and affect. Her behavior is normal.   Ct Abdomen Pelvis W Contrast  06/26/2011  *RADIOLOGY REPORT*  Clinical Data: Intra-abdominal abscess  CT ABDOMEN AND PELVIS WITH CONTRAST  Technique:  Multidetector CT imaging of the abdomen and pelvis was performed following the standard protocol during bolus administration of intravenous contrast.  Contrast: 80mL OMNIPAQUE IOHEXOL 300 MG/ML IV SOLN  Comparison: 06/15/2011  Findings: Trace bilateral pleural effusions with associated lower lobe atelectasis.  Mild cardiomegaly with a small pericardial effusion.  Coronary atherosclerosis.  Liver, spleen, and adrenal glands are within normal limits.  Pancreas is notable for a subcentimeter hypodense lesion in the pancreatic tail (series 2/image 30).  Stable hypodense bilateral renal lesions.  No hydronephrosis.  Gastrostomy tube.  Status post Billroth II.  No evidence of bowel obstruction.  Normal appendix.  Colonic diverticulosis, without associated inflammatory changes.  Atherosclerotic calcifications of the abdominal aorta and branch vessels.  Near complete resolution of fluid collection anterior to the  lateral left hepatic lobe, measuring approximately 3.8 x 0.9 cm (series 2/image 25), with indwelling pigtail drain.  Additional 2.0 x 2.9 cm fluid collection just medial to the left hepatic lobe is improved (series 2/image 31).  No suspicious abdominopelvic lymphadenopathy.  Peroneal dialysis catheter.  Uterus and bilateral ovaries are unremarkable.  Bladder is decompressed by indwelling Foley catheter.  Degenerative changes of the visualized thoracolumbar spine.  Right total  hip arthroplasty without evidence of hardware complication.  IMPRESSION: Near complete resolution of fluid collection anterior to the left hepatic level with indwelling pigtail drain, now measuring 3.8 x 0.9 cm.  Additional 2.0 x 2.9 cm fluid collection medial to the left hepatic lobe, improved.  Stable ancillary findings as above.  Original Report Authenticated By: Charline Bills, M.D.   Dg [name] W/water Sol Cm  07/02/2011  *RADIOLOGY REPORT*  Clinical Data: History of Billroth II procedure for peptic ulcer. Prior upper GI demonstrate rapid emptying from the stomach with opacification of the cecum.  Evaluate for small bowel fistula or short gut.  UPPER GIWITH WATER SOLUTION CONTRAST  Technique: Upper GI series performed with water soluble contrast.  Comparison: Upper GI of 05/25/2011.  CT of 05/27/2011.  Findings: Pre procedure scout film demonstrates right hip arthroplasty.  Moderate stool within the rectum.  Injection of a total of 150 ml of Omnipaque-300 into the gastrostomy tube.  Normal postoperative appearance of the stomach. Gastroesophageal reflux again identified.  Promptly arising from the medial side of the stomach, at approximately the 8 o'clock position, contrast fills a small bowel loop which is likely in the mid to distal small bowel.  This promptly fills the cecum/terminal ileum.  Subsequently, primarily with RPO positioning, a small bowel loop fills from the gastric remnant at approximately the 6 o'clock position.  Example  series 58.  This also is likely the mid to distal small bowel, promptly filling the cecum.  Postprocedure overhead film demonstrates no free perforation or extravasation of contrast.  IMPRESSION: 2 foci of communication between the residual stomach and small bowel.  The more inferior communication is felt to represent the gastrojejunostomy site.  Anastomosis is likely within the mid to distal small bowel, given relative small amount of remaining bowel between the stomach and terminal ileum.  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  This communication is also to the mid to distal small bowel, promptly filling the cecum.  Gastroesophageal reflux.  Original Report Authenticated By: Consuello Bossier, M.D.     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  She is high risk for morbidity, so would be best to minimize with better nutrition/health.  RTC 3-4 weeks to see if improving

## 2011-07-27 ENCOUNTER — Telehealth (INDEPENDENT_AMBULATORY_CARE_PROVIDER_SITE_OTHER): Payer: Self-pay | Admitting: General Surgery

## 2011-07-27 NOTE — Telephone Encounter (Signed)
[name] with [facility] called re: patient's abdominal wound. States at the top of her incision, it is healed, but top of wound is soft, bulging and red. Patient complaining of pain at incision. When nurse pushing on this area there is a pinhole below that is draining pus. At the bottom of her incision she has another pinhole that is draining clear fluid. No fevers. Unable to explain how long it has been like this because nurse states she "did not see it this weekend" When asked if patient is eating, drinking, moving bowels she stated "yea." Please advise.

## 2011-07-28 ENCOUNTER — Encounter (HOSPITAL_COMMUNITY): Payer: Self-pay

## 2011-07-28 ENCOUNTER — Ambulatory Visit (INDEPENDENT_AMBULATORY_CARE_PROVIDER_SITE_OTHER): Payer: Medicare HMO | Admitting: Surgery

## 2011-07-28 ENCOUNTER — Emergency Department (HOSPITAL_COMMUNITY): Payer: Medicare HMO

## 2011-07-28 ENCOUNTER — Telehealth (INDEPENDENT_AMBULATORY_CARE_PROVIDER_SITE_OTHER): Payer: Self-pay

## 2011-07-28 ENCOUNTER — Inpatient Hospital Stay (HOSPITAL_COMMUNITY)
Admission: EM | Admit: 2011-07-28 | Discharge: 2011-08-07 | DRG: 314 | Disposition: A | Discharge status: Skilled Nursing Facility | Payer: Medicare HMO | Attending: General Surgery | Admitting: General Surgery

## 2011-07-28 ENCOUNTER — Encounter (INDEPENDENT_AMBULATORY_CARE_PROVIDER_SITE_OTHER): Payer: Self-pay | Admitting: Surgery

## 2011-07-28 VITALS — BP 98/66 | HR 124 | Temp 99.3°F | Resp 20 | Ht 66.0 in

## 2011-07-28 DIAGNOSIS — R6520 Severe sepsis without septic shock: Secondary | ICD-10-CM | POA: Diagnosis present

## 2011-07-28 DIAGNOSIS — A409 Streptococcal sepsis, unspecified: Secondary | ICD-10-CM | POA: Diagnosis present

## 2011-07-28 DIAGNOSIS — A419 Sepsis, unspecified organism: Secondary | ICD-10-CM | POA: Diagnosis present

## 2011-07-28 DIAGNOSIS — E119 Type 2 diabetes mellitus without complications: Secondary | ICD-10-CM | POA: Diagnosis present

## 2011-07-28 DIAGNOSIS — T80218A Other infection due to central venous catheter, initial encounter: Principal | ICD-10-CM | POA: Diagnosis present

## 2011-07-28 DIAGNOSIS — E878 Other disorders of electrolyte and fluid balance, not elsewhere classified: Secondary | ICD-10-CM | POA: Diagnosis present

## 2011-07-28 DIAGNOSIS — E876 Hypokalemia: Secondary | ICD-10-CM | POA: Diagnosis present

## 2011-07-28 DIAGNOSIS — T148XXD Other injury of unspecified body region, subsequent encounter: Secondary | ICD-10-CM | POA: Insufficient documentation

## 2011-07-28 DIAGNOSIS — IMO0002 Reserved for concepts with insufficient information to code with codable children: Secondary | ICD-10-CM

## 2011-07-28 DIAGNOSIS — F0390 Unspecified dementia without behavioral disturbance: Secondary | ICD-10-CM | POA: Diagnosis present

## 2011-07-28 DIAGNOSIS — L02219 Cutaneous abscess of trunk, unspecified: Secondary | ICD-10-CM | POA: Diagnosis present

## 2011-07-28 DIAGNOSIS — Y849 Medical procedure, unspecified as the cause of abnormal reaction of the patient, or of later complication, without mention of misadventure at the time of the procedure: Secondary | ICD-10-CM | POA: Diagnosis present

## 2011-07-28 DIAGNOSIS — K912 Postsurgical malabsorption, not elsewhere classified: Secondary | ICD-10-CM | POA: Diagnosis present

## 2011-07-28 DIAGNOSIS — T07XXXA Unspecified multiple injuries, initial encounter: Secondary | ICD-10-CM

## 2011-07-28 DIAGNOSIS — E871 Hypo-osmolality and hyponatremia: Secondary | ICD-10-CM | POA: Diagnosis present

## 2011-07-28 DIAGNOSIS — E43 Unspecified severe protein-calorie malnutrition: Secondary | ICD-10-CM | POA: Diagnosis present

## 2011-07-28 DIAGNOSIS — L03319 Cellulitis of trunk, unspecified: Secondary | ICD-10-CM | POA: Diagnosis present

## 2011-07-28 DIAGNOSIS — I10 Essential (primary) hypertension: Secondary | ICD-10-CM | POA: Diagnosis present

## 2011-07-28 DIAGNOSIS — R6510 Systemic inflammatory response syndrome (SIRS) of non-infectious origin without acute organ dysfunction: Secondary | ICD-10-CM

## 2011-07-28 LAB — CBC
HCT: 33.7 % — ABNORMAL LOW (ref 36.0–46.0)
Hemoglobin: 11.3 g/dL — ABNORMAL LOW (ref 12.0–15.0)
MCH: 26.9 pg (ref 26.0–34.0)
MCHC: 33.5 g/dL (ref 30.0–36.0)
MCV: 80.2 fL (ref 78.0–100.0)
Platelets: 184 10*3/uL (ref 150–400)
RBC: 4.2 MIL/uL (ref 3.87–5.11)
RDW: 15.5 % (ref 11.5–15.5)
WBC: 13.6 10*3/uL — ABNORMAL HIGH (ref 4.0–10.5)

## 2011-07-28 LAB — BASIC METABOLIC PANEL
BUN: 25 mg/dL — ABNORMAL HIGH (ref 6–23)
CO2: 23 mEq/L (ref 19–32)
Calcium: 9.8 mg/dL (ref 8.4–10.5)
Chloride: 90 mEq/L — ABNORMAL LOW (ref 96–112)
Creatinine, Ser: 0.69 mg/dL (ref 0.50–1.10)
GFR calc Af Amer: >90 mL/min (ref >90)
GFR calc non Af Amer: 82 mL/min — ABNORMAL LOW (ref >90)
Glucose, Bld: 170 mg/dL — ABNORMAL HIGH (ref 70–99)
Potassium: 4.3 mEq/L (ref 3.5–5.1)
Sodium: 123 mEq/L — ABNORMAL LOW (ref 135–145)

## 2011-07-28 MED ORDER — MORPHINE SULFATE 4 MG/ML IJ SOLN
4.0000 mg | Freq: Once | INTRAMUSCULAR | Status: AC
  Administered 2011-07-28: 4 mg via INTRAVENOUS
  Filled 2011-07-28: qty 1

## 2011-07-28 MED ORDER — DEXTROSE 5 % IV SOLN
1.0000 g | Freq: Two times a day (BID) | INTRAVENOUS | Status: DC
  Administered 2011-07-28: 1 g via INTRAVENOUS
  Filled 2011-07-28 (×3): qty 1

## 2011-07-28 MED ORDER — ACETAMINOPHEN 325 MG PO TABS
650.0000 mg | ORAL_TABLET | Freq: Once | ORAL | Status: AC
Start: 2011-07-28 — End: 2011-07-28
  Administered 2011-07-28: 325 mg
  Filled 2011-07-28: qty 2

## 2011-07-28 MED ORDER — ONDANSETRON HCL 4 MG/2ML IJ SOLN
4.0000 mg | Freq: Once | INTRAMUSCULAR | Status: AC
  Administered 2011-07-28: 4 mg via INTRAVENOUS
  Filled 2011-07-28: qty 2

## 2011-07-28 MED ORDER — VANCOMYCIN HCL IN DEXTROSE 1-5 GM/200ML-% IV SOLN
1000.0000 mg | Freq: Once | INTRAVENOUS | Status: AC
  Administered 2011-07-29: 1000 mg via INTRAVENOUS
  Filled 2011-07-28: qty 200

## 2011-07-28 MED ORDER — METRONIDAZOLE IN NACL 5-0.79 MG/ML-% IV SOLN
500.0000 mg | Freq: Three times a day (TID) | INTRAVENOUS | Status: DC
  Administered 2011-07-29 – 2011-07-30 (×6): 500 mg via INTRAVENOUS
  Filled 2011-07-28 (×7): qty 100

## 2011-07-28 MED ORDER — SODIUM CHLORIDE 0.9 % IV BOLUS (SEPSIS)
1000.0000 mL | Freq: Once | INTRAVENOUS | Status: AC
  Administered 2011-07-28: 1000 mL via INTRAVENOUS

## 2011-07-28 MED ORDER — SODIUM CHLORIDE 0.9 % IV BOLUS (SEPSIS)
1000.0000 mL | Freq: Once | INTRAVENOUS | Status: AC
  Administered 2011-07-29: 1000 mL via INTRAVENOUS

## 2011-07-28 NOTE — Telephone Encounter (Signed)
Spoke to [place] about the pt and she advised me that she ended up calling back today b/c really worried about the pt's wound. Dr Michaell Cowing made aware and advised pt to be seen today at 3:30.

## 2011-07-28 NOTE — ED Notes (Addendum)
Dr.Kohut made aware of vital signs.

## 2011-07-28 NOTE — ED Notes (Signed)
MD at bedside. 

## 2011-07-28 NOTE — Patient Instructions (Signed)
Pack midline wound with Iodoform gauze packing twice daily.  Cover with dry gauze.  tmg

## 2011-07-28 NOTE — ED Provider Notes (Signed)
History    78 year old female with fever and vomiting. Transfer from nursing home. Patient with some history of dementia, and is generally poor historian. Per report fever developed today as well as vomiting. Has not had any bowel movements for the past 2 days. Patient is complaining of abdominal pain. No other complaints. Per review of the medical records, patient with lengthy hospitalization back in November. She had a perforated duodenal ulcer at that time and subsequent surgery.  CSN: [id number]  Arrival date & time 07/28/11  2205   First MD Initiated Contact with Patient 07/28/11 2208      Chief Complaint  Patient presents with  . Fever    (Consider location/radiation/quality/duration/timing/severity/associated sxs/prior treatment) HPI  Past Medical History  Diagnosis Date  . Hypertension   . Reflux   . Right elbow pain     OTIF  . Dementia   . Depression   . Osteoarthritis   . Pancreatitis 11/2007    [facility]  . Angiomyolipoma of kidney     right  . Ulcerative esophagitis 12/05/2007    hx elevated gastrin, severe on EGD by Dr Jena Gauss , h pylori negative  . Hiatal hernia   . S/P colonoscopy 2009    pt reports normal by Dr Lovell Sheehan  . Diabetes mellitus   . Anemia   . Hyponatremia   . Cellulitis   . Esophagitis     Past Surgical History  Procedure Date  . Orif right hip 1999    [facility]  . Umbilical hernia repair 66 months old    [place]  . Esophagogastroduodenoscopy 01/28/2011    Procedure: ESOPHAGOGASTRODUODENOSCOPY (EGD);  Surgeon: Arlyce Harman, MD;  Location: [facility];  Service: Endoscopy;  Laterality: N/A;  . Colonoscopy 01/28/2011    Procedure: COLONOSCOPY;  Surgeon: Arlyce Harman, MD;  Location: [facility];  Service: Endoscopy;  Laterality: N/A;  . Laparotomy 02/04/2011    Procedure: EXPLORATORY LAPAROTOMY;  Surgeon: Fabio Bering;  Location: [facility] ORS;  Service: General;  Laterality: N/A;  . Laparotomy 05/07/2011    Procedure: EXPLORATORY LAPAROTOMY;  Surgeon:  Ardeth Sportsman, MD;  Location: [facility] OR;  Service: General;  Laterality: N/A;  lysis of adhesions  . Gastrostomy 05/07/2011    Procedure: GASTROSTOMY;  Surgeon: Ardeth Sportsman, MD;  Location: [facility] OR;  Service: General;  Laterality: N/A;  g-tube / j -tube placement  . Gastrectomy 05/07/2011    Procedure: GASTRECTOMY;  Surgeon: Ardeth Sportsman, MD;  Location: [facility] OR;  Service: General;  Laterality: N/A;  Partial gastrectomy  . Cholecystectomy 05/07/2011    Procedure: CHOLECYSTECTOMY;  Surgeon: Ardeth Sportsman, MD;  Location: [facility] OR;  Service: General;  Laterality: N/A;  open    Family History  Problem Relation Age of Onset  . Cancer Mother     pelvic     History  Substance Use Topics  . Smoking status: Never Smoker   . Smokeless tobacco: Current User    Types: Chew  . Alcohol Use: No     Hx of Alcohol dependecy     OB History    Grav Para Term Preterm Abortions TAB SAB Ect Mult Living                  Review of Systems   Review of symptoms negative unless otherwise noted in HPI.   Allergies  Other and Penicillins  Home Medications   Current Outpatient Rx  Name Route Sig Dispense Refill  . ACETAMINOPHEN 500 MG  PO TABS Oral Take 1 tablet (500 mg total) by mouth 3 (three) times daily as needed for pain. 60 tablet 0  . AMLODIPINE BESYLATE 5 MG PO TABS Oral Take 1 tablet (5 mg total) by mouth daily. 30 tablet 3  . BENAZEPRIL HCL 20 MG PO TABS Oral Take 20 mg by mouth daily.      Marland Kitchen BUTALBITAL-APAP-CAFFEINE 50-325-40 MG PO TABS Oral Take 1-2 tablets by mouth every 8 (eight) hours as needed for headache. 20 tablet 0  . CALCIUM CARBONATE-VITAMIN D 500-200 MG-UNIT PO TABS Oral Take 1 tablet by mouth 2 (two) times daily.     Marland Kitchen CLONIDINE HCL 0.1 MG/24HR TD PTWK Transdermal Place 1 patch onto the skin once a week.    Marland Kitchen DARIFENACIN HYDROBROMIDE ER 15 MG PO TB24 Oral Take 15 mg by mouth daily.      . INSULIN ASPART 100 UNIT/ML Reubens SOLN Subcutaneous Inject 0-9 Units into the skin 3 (three)  times daily before meals. Sliding scale-give 3 units sq for CBG >150    . IMODIUM PO Oral Take 2 mg by mouth every 4 (four) hours as needed. For diarrhea    . METFORMIN HCL 1000 MG PO TABS Oral Take 1,000 mg by mouth daily with breakfast.     . METOPROLOL TARTRATE 12.5 MG HALF TABLET Oral Take 12.5 mg by mouth 2 (two) times daily.    Marland Kitchen ONE-DAILY MULTI VITAMINS PO TABS Oral Take 1 tablet by mouth daily.     Marland Kitchen ONDANSETRON HCL 4 MG PO TABS Oral Take 4 mg by mouth every 8 (eight) hours as needed. For nausea      . OXYCODONE HCL 5 MG/5ML PO SOLN Oral Take 5 mg by mouth every 4 (four) hours as needed. For pain    . PANTOPRAZOLE SODIUM 40 MG PO TBEC Oral Take 40 mg by mouth 2 (two) times daily before a meal.    . QUETIAPINE FUMARATE 25 MG PO TABS Oral Take 25 mg by mouth at bedtime.      . ENSURE PLUS PO LIQD Oral Take 1 Can by mouth daily. 30 Can 2  . FAT EMULSION 20 % IV EMUL Intravenous Inject 240 mLs into the vein continuous. 250 mL 0  . RESOURCE BREEZE PO LIQD Oral Take 1 Container by mouth 3 (three) times daily with meals. 10 Container 0    BP 141/75  Pulse 134  Temp(Src) 103.9 F (39.9 C) (Oral)  Resp 20  SpO2 94%  Physical Exam  Nursing note and vitals reviewed. Constitutional:       Sitting up in bed. Diaphoretic  HENT:  Head: Normocephalic and atraumatic.       Mucous membranes tacky.  Eyes: Conjunctivae are normal. Pupils are equal, round, and reactive to light. Right eye exhibits no discharge. Left eye exhibits no discharge.  Neck: Neck supple.  Cardiovascular: Regular rhythm and normal heart sounds.  Exam reveals no gallop and no friction rub.   No murmur heard.      PICC line in the left arm. Dressing with date from 07/01/2011. No purulence noted. No significant erythema. Tachycardic with a regular rhythm.  Pulmonary/Chest: Breath sounds normal.       Mild tachypnea. Lung sounds clear bilaterally.  Abdominal: Soft.       Abdomen slightly distended. Midline abdominal scar.  Superior aspect of the scar with small area of ulceration. Packing is in place in this wound. Feeding tube left upper quadrant. Abdomen is diffusely tender, but  soft. There is voluntary guarding. No rebound tenderness.  Musculoskeletal: She exhibits no edema and no tenderness.  Neurological: She is alert.  Skin: Skin is warm. She is diaphoretic.  Psychiatric:       Disoriented to place and time. Follows simple commands.    ED Course  Procedures (including critical care time)  CRITICAL CARE Performed by: Raeford Razor   Total critical care time: 35 minutes  Critical care time was exclusive of separately billable procedures and treating other patients.  Critical care was necessary to treat or prevent imminent or life-threatening deterioration.  Critical care was time spent personally by me on the following activities: development of treatment plan with patient and/or surrogate as well as nursing, discussions with consultants, evaluation of patient's response to treatment, examination of patient, obtaining history from patient or surrogate, ordering and performing treatments and interventions, ordering and review of laboratory studies, ordering and review of radiographic studies, pulse oximetry and re-evaluation of patient's condition.  Labs Reviewed  CBC - Abnormal; Notable for the following:    WBC 13.6 (*)    Hemoglobin 11.3 (*)    HCT 33.7 (*)    All other components within normal limits  BASIC METABOLIC PANEL - Abnormal; Notable for the following:    Sodium 123 (*)    Chloride 90 (*)    Glucose, Bld 170 (*)    BUN 25 (*)    GFR calc non Af Amer 82 (*)    All other components within normal limits  URINALYSIS, ROUTINE W REFLEX MICROSCOPIC  CULTURE, BLOOD (ROUTINE X 2)  CULTURE, BLOOD (ROUTINE X 2)  LACTIC ACID, PLASMA  PROCALCITONIN   Dg Chest 2 View  07/28/2011  *RADIOLOGY REPORT*  Clinical Data: 104 degrees fever.  Weakness and shortness of breath.  CHEST - 2 VIEW   Comparison: 05/29/2011  Findings: Shallow inspiration.  Heart size and pulmonary vascularity are normal for inspiratory effort.  Mild peribronchial thickening versus vascular crowding.  No focal airspace consolidation.  No blunting of costophrenic angles.  No pneumothorax.  Left PICC catheter with tip over the SVC region. Degenerative changes in the spine and shoulders.  No significant change in the appearance of the lungs since the previous study.  IMPRESSION: The shallow inspiration.  No evidence of active pulmonary disease.  Original Report Authenticated By: Marlon Pel, M.D.     Diagnoses: 1. Fever 2. Vomiting 3. Sepsis   MDM  78 year-old female with fever and vomiting. Empiric broad-spectrum antibiotics administered. Left PICC line does not have external appearance of infection but consider. Also consider intra-abdominal source given abdominal exam and recent history. Basic labs, lactate, urinalysis and blood cultures ordered. Leukocytosis noted. Mild anemia but actually better than previous labs, probably some component of hemoconcentration. Clinically dehydrated. Creatinine normal but elevated blood urea nitrogen. IV fluids bolused. Chest x-ray is clear. Mild tachypnea on exam but otherwise no respiratory distress.       Raeford Razor, MD 08/07/11 [phone]

## 2011-07-28 NOTE — ED Notes (Signed)
Pt also has abdominal wound with packing.

## 2011-07-28 NOTE — ED Notes (Signed)
Pt via ems with c/o fever 101 with vomiting of possible feces. No bm x several days and with decreased bowel sounds per [facility] nursing staff.

## 2011-07-28 NOTE — ED Notes (Signed)
Report to [employer] .

## 2011-07-28 NOTE — Telephone Encounter (Signed)
Per Burnett Harry at [facility], pts wound is red, swollen, feverish and draining. States pt is now running fever. I called Dr Michaell Cowing and reviewed this via phone. Per Dr Michaell Cowing pt should come to office to be seen in urg office today or his office in am on weds. Per Burnett Harry pt needs to be seen today since she is now running fever. Pt given appt for this pm with urg md.

## 2011-07-28 NOTE — Progress Notes (Signed)
Visit Diagnoses: 1. Wound healing, delayed     HISTORY: Patient is a 78 year old black female who underwent a complex procedure in November 2012 for perforated ulcer. She is managed by my partner, Dr. Estelle Grumbles.  She has developed drainage from her midline abdominal incision and is referred today for evaluation in the urgent office clinic.  EXAM: Midline incision has drainage from its uppermost extent. There is a small punctate opening with purulent drainage. Wound is opened with a hemostat for approximately 1 cm. There is a 3 cm cavity. Purulent fluid is evacuated and the cavity is packed with quarter inch iodoform gauze packing. I do not see any sign of gastrointestinal content to suggest a fistula.  IMPRESSION: Subcutaneous wound infection  PLAN: We will asked the nurses at the skilled care facility to pack the wound twice daily with quarter-inch iodoform gauze packing. Patient will return to see [name] in one week for wound check.  Velora Heckler, MD, FACS General & Endocrine Surgery [facility]

## 2011-07-28 NOTE — ED Notes (Signed)
Last date on the central line dressing is from 02Jan2013.

## 2011-07-29 ENCOUNTER — Encounter (HOSPITAL_COMMUNITY): Payer: Self-pay | Admitting: Radiology

## 2011-07-29 ENCOUNTER — Emergency Department (HOSPITAL_COMMUNITY): Payer: Medicare HMO

## 2011-07-29 ENCOUNTER — Other Ambulatory Visit: Payer: Self-pay

## 2011-07-29 DIAGNOSIS — E46 Unspecified protein-calorie malnutrition: Secondary | ICD-10-CM

## 2011-07-29 DIAGNOSIS — L02219 Cutaneous abscess of trunk, unspecified: Secondary | ICD-10-CM

## 2011-07-29 DIAGNOSIS — R7881 Bacteremia: Secondary | ICD-10-CM

## 2011-07-29 DIAGNOSIS — A419 Sepsis, unspecified organism: Secondary | ICD-10-CM

## 2011-07-29 DIAGNOSIS — L03319 Cellulitis of trunk, unspecified: Secondary | ICD-10-CM

## 2011-07-29 LAB — URINE MICROSCOPIC-ADD ON

## 2011-07-29 LAB — CBC
Hemoglobin: 9.5 g/dL — ABNORMAL LOW (ref 12.0–15.0)
MCH: 26.5 pg (ref 26.0–34.0)
Platelets: 163 10*3/uL (ref 150–400)
RBC: 3.58 MIL/uL — ABNORMAL LOW (ref 3.87–5.11)
WBC: 13.5 10*3/uL — ABNORMAL HIGH (ref 4.0–10.5)

## 2011-07-29 LAB — HEPATIC FUNCTION PANEL
AST: 35 U/L (ref 0–37)
Albumin: 2.7 g/dL — ABNORMAL LOW (ref 3.5–5.2)
Total Protein: 7.8 g/dL (ref 6.0–8.3)

## 2011-07-29 LAB — URINALYSIS, ROUTINE W REFLEX MICROSCOPIC
Bilirubin Urine: NEGATIVE
Glucose, UA: NEGATIVE mg/dL
Ketones, ur: NEGATIVE mg/dL
Nitrite: NEGATIVE
Protein, ur: 30 mg/dL — AB
Specific Gravity, Urine: 1.022 (ref 1.005–1.030)
Urobilinogen, UA: 1 mg/dL (ref 0.0–1.0)
pH: 5.5 (ref 5.0–8.0)

## 2011-07-29 LAB — MRSA PCR SCREENING: MRSA by PCR: NEGATIVE

## 2011-07-29 LAB — CREATININE, SERUM
Creatinine, Ser: 0.54 mg/dL (ref 0.50–1.10)
GFR calc Af Amer: >90 mL/min (ref >90)

## 2011-07-29 LAB — LACTIC ACID, PLASMA: Lactic Acid, Venous: 1.8 mmol/L (ref 0.5–2.2)

## 2011-07-29 LAB — PROCALCITONIN: Procalcitonin: 7.99 ng/mL

## 2011-07-29 LAB — LIPASE, BLOOD: Lipase: 26 U/L (ref 11–59)

## 2011-07-29 MED ORDER — SODIUM CHLORIDE 0.9 % IJ SOLN
10.0000 mL | INTRAMUSCULAR | Status: DC | PRN
  Administered 2011-07-30 – 2011-08-07 (×13): 10 mL

## 2011-07-29 MED ORDER — DARIFENACIN HYDROBROMIDE ER 15 MG PO TB24
15.0000 mg | ORAL_TABLET | Freq: Every day | ORAL | Status: DC
  Administered 2011-07-29 – 2011-08-07 (×10): 15 mg via ORAL
  Filled 2011-07-29 (×10): qty 1

## 2011-07-29 MED ORDER — TRACE MINERALS CR-CU-MN-SE-ZN 10-1000-500-60 MCG/ML IV SOLN
INTRAVENOUS | Status: AC
  Administered 2011-07-29: 18:00:00 via INTRAVENOUS
  Filled 2011-07-29: qty 2000

## 2011-07-29 MED ORDER — POTASSIUM CHLORIDE IN NACL 20-0.9 MEQ/L-% IV SOLN
INTRAVENOUS | Status: AC
  Administered 2011-07-29: 12:00:00 via INTRAVENOUS
  Filled 2011-07-29: qty 1000

## 2011-07-29 MED ORDER — BUTALBITAL-APAP-CAFFEINE 50-325-40 MG PO TABS
1.0000 | ORAL_TABLET | Freq: Three times a day (TID) | ORAL | Status: DC | PRN
  Filled 2011-07-29: qty 2

## 2011-07-29 MED ORDER — AMLODIPINE BESYLATE 5 MG PO TABS
5.0000 mg | ORAL_TABLET | Freq: Every day | ORAL | Status: DC
  Administered 2011-07-29 – 2011-08-07 (×10): 5 mg via ORAL
  Filled 2011-07-29 (×10): qty 1

## 2011-07-29 MED ORDER — BOOST / RESOURCE BREEZE PO LIQD
1.0000 | Freq: Three times a day (TID) | ORAL | Status: DC
  Administered 2011-07-29 – 2011-08-06 (×19): 1 via ORAL

## 2011-07-29 MED ORDER — OXYCODONE HCL 5 MG PO TABS
5.0000 mg | ORAL_TABLET | ORAL | Status: DC | PRN
  Administered 2011-07-29 – 2011-08-07 (×18): 5 mg via ORAL
  Filled 2011-07-29 (×18): qty 1

## 2011-07-29 MED ORDER — ACETAMINOPHEN 500 MG PO TABS
500.0000 mg | ORAL_TABLET | Freq: Three times a day (TID) | ORAL | Status: DC | PRN
  Administered 2011-07-29 – 2011-08-07 (×9): 500 mg via ORAL
  Filled 2011-07-29 (×10): qty 1

## 2011-07-29 MED ORDER — INSULIN ASPART 100 UNIT/ML ~~LOC~~ SOLN: 0.0000 [IU] | Freq: Three times a day (TID) | SUBCUTANEOUS | Status: DC

## 2011-07-29 MED ORDER — DIPHENHYDRAMINE HCL 50 MG/ML IJ SOLN: 12.5000 mg | Freq: Four times a day (QID) | INTRAMUSCULAR | Status: DC | PRN

## 2011-07-29 MED ORDER — LOPERAMIDE HCL 2 MG PO CAPS
2.0000 mg | ORAL_CAPSULE | ORAL | Status: DC | PRN
  Administered 2011-08-05: 2 mg via ORAL
  Filled 2011-07-29: qty 1

## 2011-07-29 MED ORDER — BENAZEPRIL HCL 20 MG PO TABS
20.0000 mg | ORAL_TABLET | Freq: Every day | ORAL | Status: DC
  Administered 2011-07-29 – 2011-08-07 (×10): 20 mg via ORAL
  Filled 2011-07-29 (×10): qty 1

## 2011-07-29 MED ORDER — OXYCODONE HCL 5 MG/5ML PO SOLN: 5.0000 mg | ORAL | Status: DC | PRN

## 2011-07-29 MED ORDER — HEPARIN SODIUM (PORCINE) 5000 UNIT/ML IJ SOLN
5000.0000 [IU] | Freq: Three times a day (TID) | INTRAMUSCULAR | Status: DC
  Administered 2011-07-29 – 2011-08-07 (×27): 5000 [IU] via SUBCUTANEOUS
  Filled 2011-07-29 (×30): qty 1

## 2011-07-29 MED ORDER — PANTOPRAZOLE SODIUM 40 MG PO TBEC
40.0000 mg | DELAYED_RELEASE_TABLET | Freq: Two times a day (BID) | ORAL | Status: DC
  Administered 2011-07-29 – 2011-08-07 (×18): 40 mg via ORAL
  Filled 2011-07-29 (×19): qty 1

## 2011-07-29 MED ORDER — CIPROFLOXACIN IN D5W 400 MG/200ML IV SOLN
400.0000 mg | Freq: Two times a day (BID) | INTRAVENOUS | Status: DC
  Administered 2011-07-29 (×2): 400 mg via INTRAVENOUS
  Filled 2011-07-29 (×4): qty 200

## 2011-07-29 MED ORDER — ENSURE PLUS PO LIQD
1.0000 | Freq: Every day | ORAL | Status: DC
  Administered 2011-07-29: 12:00:00 via ORAL
  Administered 2011-07-30: 1 via ORAL
  Administered 2011-07-31: 237 mL via ORAL
  Administered 2011-08-01 – 2011-08-02 (×2): 1 via ORAL
  Administered 2011-08-02: 11:00:00 via ORAL
  Administered 2011-08-03 – 2011-08-07 (×5): 1 via ORAL
  Filled 2011-07-29 (×11): qty 237

## 2011-07-29 MED ORDER — METOPROLOL TARTRATE 12.5 MG HALF TABLET
12.5000 mg | ORAL_TABLET | Freq: Two times a day (BID) | ORAL | Status: DC
  Administered 2011-07-29 – 2011-08-07 (×19): 12.5 mg via ORAL
  Filled 2011-07-29 (×21): qty 1

## 2011-07-29 MED ORDER — INSULIN ASPART 100 UNIT/ML ~~LOC~~ SOLN
0.0000 [IU] | Freq: Three times a day (TID) | SUBCUTANEOUS | Status: DC
  Filled 2011-07-29: qty 3

## 2011-07-29 MED ORDER — INSULIN ASPART 100 UNIT/ML ~~LOC~~ SOLN
0.0000 [IU] | SUBCUTANEOUS | Status: DC
  Administered 2011-07-29: 3 [IU] via SUBCUTANEOUS
  Administered 2011-07-29: 2 [IU] via SUBCUTANEOUS
  Administered 2011-07-30 (×2): 3 [IU] via SUBCUTANEOUS
  Administered 2011-07-30 (×2): 2 [IU] via SUBCUTANEOUS
  Administered 2011-07-30: 3 [IU] via SUBCUTANEOUS
  Administered 2011-07-31: 2 [IU] via SUBCUTANEOUS
  Administered 2011-07-31 (×3): 3 [IU] via SUBCUTANEOUS
  Administered 2011-07-31: 2 [IU] via SUBCUTANEOUS
  Administered 2011-07-31: 3 [IU] via SUBCUTANEOUS
  Administered 2011-08-01 (×3): 2 [IU] via SUBCUTANEOUS
  Administered 2011-08-01 (×2): 3 [IU] via SUBCUTANEOUS
  Administered 2011-08-01 – 2011-08-02 (×2): 2 [IU] via SUBCUTANEOUS
  Administered 2011-08-02: 3 [IU] via SUBCUTANEOUS
  Administered 2011-08-02 (×2): 2 [IU] via SUBCUTANEOUS
  Administered 2011-08-02: 3 [IU] via SUBCUTANEOUS
  Administered 2011-08-03 (×4): 2 [IU] via SUBCUTANEOUS
  Administered 2011-08-03: 3 [IU] via SUBCUTANEOUS
  Administered 2011-08-04: 2 [IU] via SUBCUTANEOUS
  Administered 2011-08-04 (×2): 3 [IU] via SUBCUTANEOUS
  Administered 2011-08-04: 2 [IU] via SUBCUTANEOUS
  Administered 2011-08-04 – 2011-08-05 (×2): 3 [IU] via SUBCUTANEOUS
  Administered 2011-08-05 (×2): 2 [IU] via SUBCUTANEOUS
  Administered 2011-08-05: 3 [IU] via SUBCUTANEOUS
  Administered 2011-08-05: 2 [IU] via SUBCUTANEOUS
  Administered 2011-08-05 – 2011-08-06 (×4): 3 [IU] via SUBCUTANEOUS
  Administered 2011-08-06 (×4): 2 [IU] via SUBCUTANEOUS
  Administered 2011-08-07: 3 [IU] via SUBCUTANEOUS

## 2011-07-29 MED ORDER — WHITE PETROLATUM GEL
Status: AC
  Administered 2011-07-29: 10:00:00
  Filled 2011-07-29: qty 5

## 2011-07-29 MED ORDER — FAT EMULSION 20 % IV EMUL
250.0000 mL | INTRAVENOUS | Status: AC
  Administered 2011-07-29: 250 mL via INTRAVENOUS
  Filled 2011-07-29: qty 250

## 2011-07-29 MED ORDER — IOHEXOL 300 MG/ML  SOLN
100.0000 mL | Freq: Once | INTRAMUSCULAR | Status: AC | PRN
  Administered 2011-07-29: 100 mL via INTRAVENOUS

## 2011-07-29 MED ORDER — FAT EMULSION 20 % IV EMUL: 240.0000 mL | INTRAVENOUS | Status: DC

## 2011-07-29 MED ORDER — QUETIAPINE FUMARATE 25 MG PO TABS
25.0000 mg | ORAL_TABLET | Freq: Every day | ORAL | Status: DC
  Administered 2011-07-29 – 2011-08-06 (×9): 25 mg via ORAL
  Filled 2011-07-29 (×10): qty 1

## 2011-07-29 MED ORDER — DIPHENHYDRAMINE HCL 12.5 MG/5ML PO ELIX
12.5000 mg | ORAL_SOLUTION | Freq: Four times a day (QID) | ORAL | Status: DC | PRN
  Filled 2011-07-29: qty 5

## 2011-07-29 MED ORDER — ONDANSETRON HCL 4 MG PO TABS: 4.0000 mg | ORAL_TABLET | Freq: Three times a day (TID) | ORAL | Status: DC | PRN

## 2011-07-29 MED ORDER — CLONIDINE HCL 0.1 MG/24HR TD PTWK
0.1000 mg | MEDICATED_PATCH | TRANSDERMAL | Status: DC
  Administered 2011-07-29 – 2011-08-05 (×2): 0.1 mg via TRANSDERMAL
  Filled 2011-07-29 (×2): qty 1

## 2011-07-29 MED ORDER — POTASSIUM CHLORIDE IN NACL 20-0.9 MEQ/L-% IV SOLN
INTRAVENOUS | Status: DC
  Administered 2011-07-31: 05:00:00 via INTRAVENOUS
  Filled 2011-07-29 (×3): qty 1000

## 2011-07-29 NOTE — ED Provider Notes (Signed)
BP 143/56  Pulse 106  Temp(Src) 98.8 F (37.1 C) (Oral)  Resp 27  SpO2 99%  Discussed admit with [facility] hospitalist  Forbes Cellar, MD 07/29/11 [phone]

## 2011-07-29 NOTE — Consult Note (Signed)
Reason for Consult: Abdominal wall infection Referring Physician: [name] is an 78 y.o. female.  HPI: Patient is known to the Gen. surgery service status post antrectomy and B2 for perforated ulcer. Patient had a complicated course and was managed on the hospitalist service. She was discharged to [place] [facility]. 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 She was sent to the ED tonight for worsening wound issues. CT scan shows likely recurrent early abscess formation extending from upper abdominal wall down towards the liver. Patient does describe some abdominal pain however she claims she has been eating.  Past Medical History  Diagnosis Date  . Hypertension   . Reflux   . Right elbow pain     OTIF  . Dementia   . Depression   . Osteoarthritis   . Pancreatitis 11/2007    HOP  . Angiomyolipoma of kidney     right  . Ulcerative esophagitis 12/05/2007    hx elevated gastrin, severe on EGD by Dr [name] , h pylori negative  . Hiatal hernia   . S/P colonoscopy 2009    pt reports normal by Dr [name]  . Diabetes mellitus   . Anemia   . Hyponatremia   . Cellulitis   . Esophagitis     Past Surgical History  Procedure Date  . Orif right hip 1999    [facility]  . Umbilical hernia repair 53 months old    [place]  . Esophagogastroduodenoscopy 01/28/2011    Procedure: ESOPHAGOGASTRODUODENOSCOPY (EGD);  Surgeon: [name] Harman, MD;  [facility];  Service: Endoscopy;  Laterality: N/A;  . Colonoscopy 01/28/2011    Procedure: COLONOSCOPY;  Surgeon: [name] Harman, MD;  [facility]: [facility];  Service: Endoscopy;  Laterality: N/A;  . Laparotomy 02/04/2011    Procedure: EXPLORATORY LAPAROTOMY;  Surgeon: [name];  Location: [facility] ORS;  Service: General;  Laterality: N/A;  . Laparotomy 05/07/2011    Procedure: EXPLORATORY LAPAROTOMY;  Surgeon: [name] Sportsman, MD;  Location: [facility] OR;  Service: General;  Laterality: N/A;  lysis of adhesions  .  Gastrostomy 05/07/2011    Procedure: GASTROSTOMY;  Surgeon: [name] Sportsman, MD;  Location[facility] OR;  Service: General;  Laterality: N/A;  g-tube / j -tube placement  . Gastrectomy 05/07/2011    Procedure: GASTRECTOMY;  Surgeon: [name] Sportsman, MD;  Location: [facility] OR;  Service: General;  Laterality: N/A;  Partial gastrectomy  . Cholecystectomy 05/07/2011    Procedure: CHOLECYSTECTOMY;  Surgeon: [name] Sportsman, MD;  Location: [facility] OR;  Service: General;  Laterality: N/A;  open    Family History  Problem Relation Age of Onset  . Cancer Mother     pelvic     Social History:  reports that she has never smoked. Her smokeless tobacco use includes Chew. She reports that she does not drink alcohol or use illicit drugs.  Allergies:  Allergies  Allergen Reactions  . Other Swelling    Pinto beans cause swelling and hives  . Penicillins Swelling    Medications: I have reviewed the patient's current medications.  Results for orders placed during the hospital encounter of 07/28/11 (from the past 48 hour(s))  CBC     Status: Abnormal   Collection Time   07/28/11 10:12 PM      Component Value Range Comment   WBC 13.6 (*) 4.0 - 10.5 (K/uL)    RBC 4.20  3.87 - 5.11 (MIL/uL)    Hemoglobin 11.3 (*)  12.0 - 15.0 (g/dL)    HCT 82.9 (*) 56.2 - 46.0 (%)    MCV 80.2  78.0 - 100.0 (fL)    MCH 26.9  26.0 - 34.0 (pg)    MCHC 33.5  30.0 - 36.0 (g/dL)    RDW 13.0  86.5 - 78.4 (%)    Platelets 184  150 - 400 (K/uL)   BASIC METABOLIC PANEL     Status: Abnormal   Collection Time   07/28/11 10:12 PM      Component Value Range Comment   Sodium 123 (*) 135 - 145 (mEq/L)    Potassium 4.3  3.5 - 5.1 (mEq/L)    Chloride 90 (*) 96 - 112 (mEq/L)    CO2 23  19 - 32 (mEq/L)    Glucose, Bld 170 (*) 70 - 99 (mg/dL)    BUN 25 (*) 6 - 23 (mg/dL)    Creatinine, Ser 6.96  0.50 - 1.10 (mg/dL)    Calcium 9.8  8.4 - 10.5 (mg/dL)    GFR calc non Af Amer 82 (*) >90 (mL/min)    GFR calc Af Amer >90  >90 (mL/min)   LIPASE,  BLOOD     Status: Normal   Collection Time   07/28/11 10:12 PM      Component Value Range Comment   Lipase 26  11 - 59 (U/L)   HEPATIC FUNCTION PANEL     Status: Abnormal   Collection Time   07/28/11 10:12 PM      Component Value Range Comment   Total Protein 7.8  6.0 - 8.3 (g/dL)    Albumin 2.7 (*) 3.5 - 5.2 (g/dL)    AST 35  0 - 37 (U/L)    ALT 39 (*) 0 - 35 (U/L)    Alkaline Phosphatase 161 (*) 39 - 117 (U/L)    Total Bilirubin 0.3  0.3 - 1.2 (mg/dL)    Bilirubin, Direct 0.1  0.0 - 0.3 (mg/dL)    Indirect Bilirubin 0.2 (*) 0.3 - 0.9 (mg/dL)   LACTIC ACID, PLASMA     Status: Normal   Collection Time   07/28/11 10:31 PM      Component Value Range Comment   Lactic Acid, Venous 1.8  0.5 - 2.2 (mmol/L)   PROCALCITONIN     Status: Normal   Collection Time   07/28/11 11:30 PM      Component Value Range Comment   Procalcitonin 7.99     URINALYSIS, ROUTINE W REFLEX MICROSCOPIC     Status: Abnormal   Collection Time   07/29/11 12:16 AM      Component Value Range Comment   Color, Urine YELLOW  YELLOW     APPearance CLOUDY (*) CLEAR     Specific Gravity, Urine 1.022  1.005 - 1.030     pH 5.5  5.0 - 8.0     Glucose, UA NEGATIVE  NEGATIVE (mg/dL)    Hgb urine dipstick SMALL (*) NEGATIVE     Bilirubin Urine NEGATIVE  NEGATIVE     Ketones, ur NEGATIVE  NEGATIVE (mg/dL)    Protein, ur 30 (*) NEGATIVE (mg/dL)    Urobilinogen, UA 1.0  0.0 - 1.0 (mg/dL)    Nitrite NEGATIVE  NEGATIVE     Leukocytes, UA LARGE (*) NEGATIVE    URINE MICROSCOPIC-ADD ON     Status: Abnormal   Collection Time   07/29/11 12:16 AM      Component Value Range Comment   Squamous Epithelial / LPF RARE  RARE     WBC, UA 21-50  <3 (WBC/hpf)    RBC / HPF 0-2  <3 (RBC/hpf)    Bacteria, UA FEW (*) RARE      Dg Chest 2 View  07/28/2011  *RADIOLOGY REPORT*  Clinical Data: 104 degrees fever.  Weakness and shortness of breath.  CHEST - 2 VIEW  Comparison: 05/29/2011  Findings: Shallow inspiration.  Heart size and pulmonary  vascularity are normal for inspiratory effort.  Mild peribronchial thickening versus vascular crowding.  No focal airspace consolidation.  No blunting of costophrenic angles.  No pneumothorax.  Left PICC catheter with tip over the SVC region. Degenerative changes in the spine and shoulders.  No significant change in the appearance of the lungs since the previous study.  IMPRESSION: The shallow inspiration.  No evidence of active pulmonary disease.  Original Report Authenticated By: Marlon Pel, M.D.   Ct Abdomen Pelvis W Contrast  07/29/2011  *RADIOLOGY REPORT*  Clinical Data: Fever and vomiting.  Decreased bowel sounds.  CT ABDOMEN AND PELVIS WITH CONTRAST  Technique:  Multidetector CT imaging of the abdomen and pelvis was performed following the standard protocol during bolus administration of intravenous contrast.  Contrast: OMNIPAQUE IOHEXOL 300 MG/ML IV SOLN  Comparison: 06/26/2011  Findings: Atelectasis or infiltration in the lung bases with mild bronchiectasis.  Small esophageal hiatal hernia.  Mild pericardial effusion.  Pericardial effusion is decreased since the previous study.  Since the previous study, there has been interval removal of a midline upper abdominal pigtail drainage catheter.  There is a small residual fluid collection anterior to the left hepatic lobe in the region of the previous catheter.  In the anterior abdominal wall over the previous catheter location, there is infiltration and fluid and gas in the abdominal wall and fat.  Residual or recurrent infectious process is not excluded.  Small loculated fluid collection between the body of the pancreas in the liver edge measuring 22 mm.  Inflammatory infiltration or edema in the right upper quadrant abdominal fat in the gallbladder fossa.  The gallbladder is surgically absent.  The pancreas is somewhat atrophic.  No pancreatic ductal dilatation.  The changes may represent sequela pancreatitis.  The liver, spleen, adrenal  glands, kidneys, and retroperitoneal lymph nodes are unremarkable.  There is a gastrostomy tube in place.  Postsurgical changes in the distal stomach.  Contrast material extends to the colon without obstruction.  Diverticulosis of the colon without inflammatory change.  Calcification of normal caliber abdominal aorta.  Pelvis:  Visualization of the low pelvis is limited due to streak artifact arising from the right hip prosthesis.  Nodular appearance of the uterus with calcifications consistent with fibroids.  No abnormal adnexal masses.  The bladder wall does not appear thickened.  Diverticulosis of sigmoid colon without inflammatory change.  The appendix is normal.  Degenerative changes in the lumbar spine.  IMPRESSION: Interval removal of the anterior abdominal wall drainage catheter. There is recurrent infiltration, gas, and small loculated fluid collections in the anterior abdominal wall and extending to the liver edge.  Changes suggest recurrent infectious process and developing abscesses.  No evidence of bowel obstruction.  Original Report Authenticated By: Marlon Pel, M.D.    Review of Systems  Constitutional: Positive for fever.  HENT: Negative.   Eyes: Negative.   Respiratory: Negative.   Cardiovascular: Negative.   Gastrointestinal: Positive for abdominal pain.  Genitourinary: Negative.   Musculoskeletal: Negative.   Skin: Negative.   Endo/Heme/Allergies: Negative.  Blood pressure 125/87, pulse 111, temperature 98.8 F (37.1 C), temperature source Oral, resp. rate 27, SpO2 95.00%. Physical Exam  Constitutional: She appears well-developed. No distress.  HENT:  Head: Normocephalic and atraumatic.  Neck: Normal range of motion. Neck supple.  Cardiovascular: Normal rate and normal heart sounds.   Respiratory: Effort normal and breath sounds normal. No respiratory distress. She has no wheezes.  GI: Soft. She exhibits no distension. There is tenderness. There is no rebound and  no guarding.       Wound at the upper midline incision scar has iodoform packing. There is serous drainage. There is tenderness around this area.  Neurological: She is alert.    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 Agree with admission that the medical service. She needs IV antibiotics. We'll review the films with interventional radiology for possible percutaneous drainage. She does not need emergent surgery at this time. We will follow closely.  Yaretzi Ernandez E 07/29/2011, 4:15 AM

## 2011-07-29 NOTE — ED Notes (Signed)
MD at bedside. 

## 2011-07-29 NOTE — ED Provider Notes (Signed)
BP 128/96  Pulse 118  Temp(Src) 100.3 F (37.9 C) (Oral)  Resp 25  SpO2 97%  Patient signed out from Dr. Juleen China. Here with fever (improving), tachycardia, abdominal pain. H/O recent perforated duodenal ulcer. Labs reviewed. Added lipase and LFTs. U/A pending. Ct A/P pending. Covered broadly with abx. Admit after CT.   Date: 07/29/2011  Rate: 115  Rhythm: sinus tachycardia  QRS Axis: normal  Intervals: normal  ST/T Wave abnormalities: normal  Conduction Disutrbances:none  Narrative Interpretation:   Old EKG Reviewed: changes noted previous ecg NSR    Forbes Cellar, MD 07/29/11 [phone]

## 2011-07-29 NOTE — Progress Notes (Signed)
INITIAL ADULT NUTRITION ASSESSMENT Date: 07/29/2011   Time: 3:15 PM  Reason for Assessment: Consult- new TPN  ASSESSMENT: Female 78 y.o.  [id number] post antrectomy and B2 for perforated ulcer. Patient had a complicated course and was managed on the hospitalist service. She was discharged to [place] [facility]. 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 She was sent to the ED tonight for worsening wound issues CT scan shows likely recurrent early abscess formation extending from upper abdominal wall down towards the liver.   Hx:  Past Medical History  Diagnosis Date  . Hypertension   . Reflux   . Right elbow pain     OTIF  . Dementia   . Depression   . Osteoarthritis   . Pancreatitis 11/2007    [facility]  . Angiomyolipoma of kidney     right  . Ulcerative esophagitis 12/05/2007    hx elevated gastrin, severe on EGD by Dr Jena Gauss , h pylori negative  . Hiatal hernia   . S/P colonoscopy 2009    pt reports normal by Dr Lovell Sheehan  . Diabetes mellitus   . Anemia   . Hyponatremia   . Cellulitis   . Esophagitis    Past Surgical History  Procedure Date  . Orif right hip 1999    [facility]  . Umbilical hernia repair 38 months old    [place]  . Esophagogastroduodenoscopy 01/28/2011    Procedure: ESOPHAGOGASTRODUODENOSCOPY (EGD);  Surgeon: Arlyce Harman, MD;  [facility];  Service: Endoscopy;  Laterality: N/A;  . Colonoscopy 01/28/2011    Procedure: COLONOSCOPY;  Surgeon: Arlyce Harman, MD;  [facility]: [facility];  Service: Endoscopy;  Laterality: N/A;  . Laparotomy 02/04/2011    Procedure: EXPLORATORY LAPAROTOMY;  Surgeon: Fabio Bering;  Location: [facility]S;  Service: General;  Laterality: N/A;  . Laparotomy 05/07/2011    Procedure: EXPLORATORY LAPAROTOMY;  Surgeon: Ardeth Sportsman, MD;  Location: [facility] OR;  Service: General;  Laterality: N/A;  lysis of adhesions  . Gastrostomy 05/07/2011    Procedure: GASTROSTOMY;  Surgeon: Ardeth Sportsman, MD;  Location[facility] OR;   Service: General;  Laterality: N/A;  g-tube / j -tube placement  . Gastrectomy 05/07/2011    Procedure: GASTRECTOMY;  Surgeon: Ardeth Sportsman, MD;  Location: [facility] OR;  Service: General;  Laterality: N/A;  Partial gastrectomy  . Cholecystectomy 05/07/2011    Procedure: CHOLECYSTECTOMY;  Surgeon: Ardeth Sportsman, MD;  Location: [facility] OR;  Service: General;  Laterality: N/A;  open   Related Meds:     . acetaminophen  650 mg Per Tube Once  . amLODipine  5 mg Oral Daily  . benazepril  20 mg Oral Daily  . ciprofloxacin  400 mg Intravenous Q12H  . cloNIDine  0.1 mg Transdermal Weekly  . darifenacin  15 mg Oral Daily  . Ensure Plus  1 Can Oral Daily  . feeding supplement  1 Container Oral TID WC  . heparin  5,000 Units Subcutaneous Q8H  . insulin aspart  0-15 Units Subcutaneous Q4H  . metoprolol tartrate  12.5 mg Oral BID  . metronidazole  500 mg Intravenous Q8H  .  morphine injection  4 mg Intravenous Once  . ondansetron (ZOFRAN) IV  4 mg Intravenous Once  . pantoprazole  40 mg Oral BID AC  . QUEtiapine  25 mg Oral QHS  . sodium chloride  1,000 mL Intravenous Once  . sodium chloride  1,000 mL Intravenous Once  . vancomycin  1,000 mg Intravenous Once  . white petrolatum      . DISCONTD: ceFEPime (MAXIPIME) IV  1 g Intravenous Q12H  . DISCONTD: insulin aspart  0-15 Units Subcutaneous TID WC  . DISCONTD: insulin aspart  0-9 Units Subcutaneous TID AC    Ht: 5' 4.5" (1.638 m)  Wt: 162 lb (73.483 kg)  Ideal Wt: 55.7 kg % Ideal Wt: 132%  Usual Wt: 167 lb/ 76 kg on 04/21/11 (per office visit) % Usual Wt: 96.6%  Body Mass Index is 27.4 (kg/ m^2). Overweight  Food/Nutrition Related Hx: Pt had a gastroileostomy in November 2012. Now pt only eats for pleasure; she is on TPN at SNF.  Labs:  CMP     Component Value Date/Time   NA 123* 07/28/2011 2212   K 4.3 07/28/2011 2212   CL 90* 07/28/2011 2212   CO2 23 07/28/2011 2212   GLUCOSE 170* 07/28/2011 2212   BUN 25* 07/28/2011 2212   CREATININE  0.54 07/29/2011 1200   CALCIUM 9.8 07/28/2011 2212   PROT 7.8 07/28/2011 2212   ALBUMIN 2.7* 07/28/2011 2212   AST 35 07/28/2011 2212   ALT 39* 07/28/2011 2212   ALKPHOS 161* 07/28/2011 2212   BILITOT 0.3 07/28/2011 2212   GFRNONAA 89* 07/29/2011 1200   GFRAA >90 07/29/2011 1200   CBG (last 3)   Basename 07/29/11 1122  GLUCAP 132*    Diet Order: Carb Modified Medium (1600-2000)  Supplements/Tube Feeding: TPN Clinimix E 5/15 at 80 ml/hr with Lipids (20% IVFE at 10 ml/hr), multivitamins, and trace elements are provided 3 times weekly MWF due to national backorder. Pt to receive lipids today. This provides 1569 kcals and 96 grams protein daily (based on weekly average). This meets 104.6% minimum estimated kcal and 106.6% minimum estimated protein needs.  IVF:    0.9 % NaCl with KCl 20 mEq / L Last Rate: 50 mL/hr at 07/29/11 1143  0.9 % NaCl with KCl 20 mEq / L   TPN (CLINIMIX) +/- additives   And   fat emulsion   DISCONTD: fat emulsion     Estimated Nutritional Needs:   Kcal: 1500-1600 Protein: 90-105 grams Fluid: per MD  NUTRITION DIAGNOSIS: -Altered GI function (NI-1.4).  Status: Ongoing  RELATED TO: decreased functional length of the GI tract  AS EVIDENCE BY: recent gastrostomy, gastrectomy, and choleycystectomy  MONITORING/EVALUATION(Goals): Goals: PO intake and TPN to meet 90-100% of estimated kcal and protein needs, maximize energy provision as able during national lipid backorder.  Monitor: TPN tolerance/ adequacy, weight trends, labs, I/O's  EDUCATION NEEDS: -Education not appropriate at this time  INTERVENTION:  TPN per pharmacy  RD to follow for nutrition care  Dietitian #:[phone]  DOCUMENTATION CODES Per approved criteria  -Not Applicable    Karenann Cai 07/29/2011, 3:15 PM Derrell Lolling Anastasia Fiedler  [phone]

## 2011-07-29 NOTE — ED Provider Notes (Signed)
CT findings reviewed. Discussed with surgery -- to see. Patient resting comfortably now. Cont to have diffuse ttp. Fever resolved. Tachycardia resolved but borderline hypotensive SBP 96 at this time. Given 500cc bolus of IVF  BP 92/44  Pulse 97  Temp(Src) 98.8 F (37.1 C) (Oral)  Resp 25  SpO2 95%   Forbes Cellar, MD 07/29/11 [phone]

## 2011-07-29 NOTE — ED Notes (Signed)
Pt resting quietly, Alona Bene for room 5154 to call back for report.

## 2011-07-29 NOTE — Progress Notes (Signed)
PARENTERAL NUTRITION CONSULT NOTE - INITIAL  Pharmacy Consult for TPN Indication: Short gut  Allergies  Allergen Reactions  . Other Swelling    Pinto beans cause swelling and hives  . Penicillins Swelling    Patient Measurements:   Adjusted Body Weight:  Usual Weight: 73.5 kg  Vital Signs: Temp: 99.9 F (37.7 C) (01/30 0630) Temp src: Oral (01/30 0630) BP: 145/63 mmHg (01/30 0830) Pulse Rate: 110  (01/30 0830) Intake/Output from previous day:   Intake/Output from this shift:    Labs:  [facility] 07/28/11 2212  WBC 13.6*  HGB 11.3*  HCT 33.7*  PLT 184  APTT --  INR --     [facility] 07/28/11 2212  NA 123*  K 4.3  CL 90*  CO2 23  GLUCOSE 170*  BUN 25*  CREATININE 0.69  LABCREA --  CREAT24HRUR --  CALCIUM 9.8  MG --  PHOS --  PROT 7.8  ALBUMIN 2.7*  AST 35  ALT 39*  ALKPHOS 161*  BILITOT 0.3  BILIDIR 0.1  IBILI 0.2*  PREALBUMIN --  TRIG --  CHOLHDL --  CHOL --   The CrCl is unknown because both a height and weight (above a minimum accepted value) are required for this calculation.    Basename 07/29/11 1122  GLUCAP 132*    Medical History: Past Medical History  Diagnosis Date  . Hypertension   . Reflux   . Right elbow pain     OTIF  . Dementia   . Depression   . Osteoarthritis   . Pancreatitis 11/2007    HOP  . Angiomyolipoma of kidney     right  . Ulcerative esophagitis 12/05/2007    hx elevated gastrin, severe on EGD by Dr Jena Gauss , h pylori negative  . Hiatal hernia   . S/P colonoscopy 2009    pt reports normal by Dr Lovell Sheehan  . Diabetes mellitus   . Anemia   . Hyponatremia   . Cellulitis   . Esophagitis     Medications:  Prescriptions prior to admission  Medication Sig Dispense Refill  . acetaminophen (TYLENOL) 500 MG tablet Take 1 tablet (500 mg total) by mouth 3 (three) times daily as needed for pain.  60 tablet  0  . amLODipine (NORVASC) 5 MG tablet Take 1 tablet (5 mg total) by mouth daily.  30 tablet  3  . benazepril  (LOTENSIN) 20 MG tablet Take 20 mg by mouth daily.        . butalbital-acetaminophen-caffeine (FIORICET) 50-325-40 MG per tablet Take 1-2 tablets by mouth every 8 (eight) hours as needed for headache.  20 tablet  0  . calcium-vitamin D (OSCAL WITH D) 500-200 MG-UNIT per tablet Take 1 tablet by mouth 2 (two) times daily.       . cloNIDine (CATAPRES - DOSED IN MG/24 HR) 0.1 mg/24hr patch Place 1 patch onto the skin once a week.      . darifenacin (ENABLEX) 15 MG 24 hr tablet Take 15 mg by mouth daily.        . insulin aspart (NOVOLOG) 100 UNIT/ML injection Inject 0-9 Units into the skin 3 (three) times daily before meals. Sliding scale-give 3 units sq for CBG >150      . Loperamide HCl (IMODIUM PO) Take 2 mg by mouth every 4 (four) hours as needed. For diarrhea      . metFORMIN (GLUCOPHAGE) 1000 MG tablet Take 1,000 mg by mouth daily with breakfast.       .  metoprolol tartrate (LOPRESSOR) 12.5 mg TABS Take 12.5 mg by mouth 2 (two) times daily.      . Multiple Vitamin (MULTIVITAMIN) tablet Take 1 tablet by mouth daily.       . ondansetron (ZOFRAN) 4 MG tablet Take 4 mg by mouth every 8 (eight) hours as needed. For nausea        . oxyCODONE (ROXICODONE) 5 MG/5ML solution Take 5 mg by mouth every 4 (four) hours as needed. For pain      . pantoprazole (PROTONIX) 40 MG tablet Take 40 mg by mouth 2 (two) times daily before a meal.      . QUEtiapine (SEROQUEL) 25 MG tablet Take 25 mg by mouth at bedtime.        . Ensure Plus (ENSURE PLUS) LIQD Take 1 Can by mouth daily.  30 Can  2  . fat emulsion 20 % infusion Inject 240 mLs into the vein continuous.  250 mL  0  . feeding supplement (RESOURCE BREEZE) LIQD Take 1 Container by mouth 3 (three) times daily with meals.  10 Container  0    Insulin Requirements in the past 24 hours:  Start SSI and add insulin to TPN as required on previous admission.  Current Nutrition:  Diet but with minimal nutritional absorption with short gut.  Assessment: Erosive  esophagitis with Perf pyloric ulcer s/p lap 11/7. s/p gastrostomy, gastrectomy, choleycystectomy. Last d/c 07/06/11. 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 Eats only for pleaure. Last pAlb 15 on 07/06/11. Use goal for previous admit. Endo: DM.Start SSI. Had high requirements of 50 units/bag in last TPN. Lytes: Na and Cl low. Renal: ok Pulm: n/a Cards: HTN VSS: Norvasc, benazepril, Clonidine patch Hepatobil: LFTs WNL, trig WNL. Last Prealb 15 (07/06/11) Neuro: Seroquel ID: Tmax [id number]. Recurrent early abscess formation with temperature, weakness, and SOB.  Nutritional Goals:  1750-1950 kCal, 95-110 grams of protein per day  Plan:  1. Clinimix E 5/15 at 80 cc/hr (Going to start at full rate since she gets TPN at [facility]). Start with 30 units/bag insulin(previously needed 50) 2. Change SSI to q 4h. 3. Dietician consult ordered 4.Fats, MV, TE MWF....f/u any new RD goal. 5. Baseline nutrition labs tomorrow. Merilynn Finland, [employer] 07/29/2011,11:41 AM

## 2011-07-30 LAB — DIFFERENTIAL
Basophils Absolute: 0 10*3/uL (ref 0.0–0.1)
Basophils Relative: 0 % (ref 0–1)
Eosinophils Relative: 0 % (ref 0–5)
Lymphocytes Relative: 22 % (ref 12–46)
Monocytes Absolute: 0.8 10*3/uL (ref 0.1–1.0)
Neutro Abs: 6.5 10*3/uL (ref 1.7–7.7)

## 2011-07-30 LAB — COMPREHENSIVE METABOLIC PANEL
AST: 29 U/L (ref 0–37)
Albumin: 1.9 g/dL — ABNORMAL LOW (ref 3.5–5.2)
BUN: 11 mg/dL (ref 6–23)
Calcium: 8.4 mg/dL (ref 8.4–10.5)
Chloride: 96 mEq/L (ref 96–112)
Creatinine, Ser: 0.55 mg/dL (ref 0.50–1.10)
Total Bilirubin: 0.3 mg/dL (ref 0.3–1.2)
Total Protein: 6.2 g/dL (ref 6.0–8.3)

## 2011-07-30 LAB — GLUCOSE, CAPILLARY
Glucose-Capillary: 152 mg/dL — ABNORMAL HIGH (ref 70–99)
Glucose-Capillary: 172 mg/dL — ABNORMAL HIGH (ref 70–99)
Glucose-Capillary: 165 mg/dL — ABNORMAL HIGH (ref 70–99)

## 2011-07-30 LAB — CBC
HCT: 25.4 % — ABNORMAL LOW (ref 36.0–46.0)
MCHC: 33.5 g/dL (ref 30.0–36.0)
Platelets: 177 10*3/uL (ref 150–400)
RDW: 15.3 % (ref 11.5–15.5)
WBC: 9.3 10*3/uL (ref 4.0–10.5)

## 2011-07-30 LAB — PREALBUMIN: Prealbumin: 6 mg/dL — ABNORMAL LOW (ref 17.0–34.0)

## 2011-07-30 LAB — MAGNESIUM: Magnesium: 1.5 mg/dL (ref 1.5–2.5)

## 2011-07-30 LAB — PHOSPHORUS: Phosphorus: 3.9 mg/dL (ref 2.3–4.6)

## 2011-07-30 LAB — TRIGLYCERIDES: Triglycerides: 84 mg/dL (ref <150)

## 2011-07-30 MED ORDER — METRONIDAZOLE 500 MG PO TABS
500.0000 mg | ORAL_TABLET | Freq: Three times a day (TID) | ORAL | Status: DC
  Administered 2011-07-30 – 2011-08-07 (×25): 500 mg via ORAL
  Filled 2011-07-30 (×28): qty 1

## 2011-07-30 MED ORDER — POTASSIUM CHLORIDE 10 MEQ/50ML IV SOLN
10.0000 meq | INTRAVENOUS | Status: AC
  Administered 2011-07-30 (×4): 10 meq via INTRAVENOUS
  Filled 2011-07-30 (×4): qty 50

## 2011-07-30 MED ORDER — VANCOMYCIN HCL 1000 MG IV SOLR
750.0000 mg | Freq: Two times a day (BID) | INTRAVENOUS | Status: DC
  Administered 2011-07-30 – 2011-08-02 (×7): 750 mg via INTRAVENOUS
  Filled 2011-07-30 (×8): qty 750

## 2011-07-30 MED ORDER — CIPROFLOXACIN HCL 500 MG PO TABS
500.0000 mg | ORAL_TABLET | Freq: Two times a day (BID) | ORAL | Status: DC
  Administered 2011-07-30 – 2011-08-07 (×17): 500 mg via ORAL
  Filled 2011-07-30 (×19): qty 1

## 2011-07-30 MED ORDER — INSULIN REGULAR HUMAN 100 UNIT/ML IJ SOLN
INTRAVENOUS | Status: AC
  Administered 2011-07-30: 18:00:00 via INTRAVENOUS
  Filled 2011-07-30: qty 2000

## 2011-07-30 MED ORDER — POTASSIUM CHLORIDE 10 MEQ/50ML IV SOLN
10.0000 meq | Freq: Once | INTRAVENOUS | Status: AC
  Administered 2011-07-30: 10 meq via INTRAVENOUS
  Filled 2011-07-30 (×3): qty 50

## 2011-07-30 MED ORDER — MAGNESIUM SULFATE 40 MG/ML IJ SOLN
2.0000 g | Freq: Once | INTRAMUSCULAR | Status: AC
  Administered 2011-07-30: 2 g via INTRAVENOUS
  Filled 2011-07-30: qty 50

## 2011-07-30 NOTE — Progress Notes (Signed)
PARENTERAL NUTRITION CONSULT NOTE - FOLLOW UP   Pharmacy Consult for TPN Indication: Short gut  Allergies  Allergen Reactions  . Other Swelling    Pinto beans cause swelling and hives  . Penicillins Swelling    Patient Measurements: Height: 5' 6.5" (168.9 cm) Weight: 162 lb (73.483 kg) IBW/kg (Calculated) : 60.45  Usual Weight: 73.5  Vital Signs: Temp: 100.1 F (37.8 C) (01/31 0635) BP: 149/67 mmHg (01/31 0635) Pulse Rate: 108  (01/31 0635) Intake/Output from previous day: 01/30 0701 - 01/31 0700 In: 1715 [P.O.:120; I.V.:474; IV Piggyback:100; TPN:1021] Out: -  Intake/Output from this shift:    Labs:  Basename 07/30/11 0630 07/29/11 1200 07/28/11 2212  WBC 9.3 13.5* 13.6*  HGB 8.5* 9.5* 11.3*  HCT 25.4* 28.8* 33.7*  PLT 177 163 184  APTT -- -- --  INR -- -- --     Basename 07/30/11 0630 07/29/11 1200 07/28/11 2212  NA 128* -- 123*  K 3.0* -- 4.3  CL 96 -- 90*  CO2 21 -- 23  GLUCOSE 180* -- 170*  BUN 11 -- 25*  CREATININE 0.55 0.54 0.69  LABCREA -- -- --  CREAT24HRUR -- -- --  CALCIUM 8.4 -- 9.8  MG 1.5 -- --  PHOS 3.9 -- --  PROT 6.2 -- 7.8  ALBUMIN 1.9* -- 2.7*  AST 29 -- 35  ALT 24 -- 39*  ALKPHOS 125* -- 161*  BILITOT 0.3 -- 0.3  BILIDIR -- -- 0.1  IBILI -- -- 0.2*  PREALBUMIN -- -- --  TRIG -- -- --  CHOLHDL -- -- --  CHOL -- -- --   Estimated Creatinine Clearance: 61.1 ml/min (by C-G formula based on Cr of 0.55).    Basename 07/30/11 0601 07/30/11 0153 07/29/11 2204  GLUCAP 172* 130* 153*    Insulin Requirements in the past 24 hours:  10 units ssi (currently 30 units in TNA)  Current Nutrition:  PO: carb modified diet started 1/30: 120 cc po recorded Clinimix E 5/15 at 80 ml/hr and lipids at 10 ml/hr MWF: provides weekly average of 1569 kcl and 96  protein per day.  Assessment: [name] admitted from [facility] where she is on chronic TPN and takes POs only for pleasure.  History of erosive esophagitis with perf pyloric ulcer  s/p lap 11/7. s/p gastrostomy, gastrectomy, choleycystectomy. Last d/c 07/06/11. 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  Continue clinimix E5/15 at 80 ml/hr 2.  Increase insulin in tna to 40 units/bag 3.  Continue lipids, MVI, TE, MWF only due to national shortage 4.  Replete K with 4 K runs, replete Mag with 2 g IV x1 5.  F/u am bmp, mag, phos  Assad Harbeson L. Illene Bolus, PharmD, BCPS Clinical Pharmacist Pager: [phone] 07/30/2011 9:02 AM

## 2011-07-30 NOTE — Progress Notes (Signed)
Patient ID: [name], female   DOB: 14-Jul-1931, 78 y.o.   MRN: [id number]    Subjective: Pt feels well.  No complaints.  Drinking breeze.  No diarrhea yet.  Objective: Vital signs in last 24 hours: Temp:  [99.4 F (37.4 C)-100.9 F (38.3 C)] 100.1 F (37.8 C) (01/31 0635) Pulse Rate:  [100-114] 108  (01/31 0635) Resp:  [20-24] 20  (01/31 0635) BP: (132-149)/(43-82) 149/67 mmHg (01/31 0635) SpO2:  [94 %-97 %] 94 % (01/31 0635) Weight:  [162 lb (73.483 kg)] 162 lb (73.483 kg) (01/30 2317) Last BM Date:  (unknown)  Intake/Output from previous day: 01/30 0701 - 01/31 0700 In: 1715 [P.O.:120; I.V.:474; IV Piggyback:100; TPN:1021] Out: -  Intake/Output this shift:    PE: Abd: soft, NT, ND, +BS, wound packed and clean Heart: mildly tachy Lungs: CTAB  Lab Results:   Basename 07/30/11 0630 07/29/11 1200  WBC 9.3 13.5*  HGB 8.5* 9.5*  HCT 25.4* 28.8*  PLT 177 163   BMET  Basename 07/30/11 0630 07/29/11 1200 07/28/11 2212  NA 128* -- 123*  K 3.0* -- 4.3  CL 96 -- 90*  CO2 21 -- 23  GLUCOSE 180* -- 170*  BUN 11 -- 25*  CREATININE 0.55 0.54 --  CALCIUM 8.4 -- 9.8   PT/INR No results found for this basename: LABPROT:2,INR:2 in the last 72 hours   Studies/Results: Dg Chest 2 View  07/28/2011  *RADIOLOGY REPORT*  Clinical Data: 104 degrees fever.  Weakness and shortness of breath.  CHEST - 2 VIEW  Comparison: 05/29/2011  Findings: Shallow inspiration.  Heart size and pulmonary vascularity are normal for inspiratory effort.  Mild peribronchial thickening versus vascular crowding.  No focal airspace consolidation.  No blunting of costophrenic angles.  No pneumothorax.  Left PICC catheter with tip over the SVC region. Degenerative changes in the spine and shoulders.  No significant change in the appearance of the lungs since the previous study.  IMPRESSION: The shallow inspiration.  No evidence of active pulmonary disease.  Original Report Authenticated By: Marlon Pel,  M.D.   Ct Abdomen Pelvis W Contrast  07/29/2011  *RADIOLOGY REPORT*  Clinical Data: Fever and vomiting.  Decreased bowel sounds.  CT ABDOMEN AND PELVIS WITH CONTRAST  Technique:  Multidetector CT imaging of the abdomen and pelvis was performed following the standard protocol during bolus administration of intravenous contrast.  Contrast: OMNIPAQUE IOHEXOL 300 MG/ML IV SOLN  Comparison: 06/26/2011  Findings: Atelectasis or infiltration in the lung bases with mild bronchiectasis.  Small esophageal hiatal hernia.  Mild pericardial effusion.  Pericardial effusion is decreased since the previous study.  Since the previous study, there has been interval removal of a midline upper abdominal pigtail drainage catheter.  There is a small residual fluid collection anterior to the left hepatic lobe in the region of the previous catheter.  In the anterior abdominal wall over the previous catheter location, there is infiltration and fluid and gas in the abdominal wall and fat.  Residual or recurrent infectious process is not excluded.  Small loculated fluid collection between the body of the pancreas in the liver edge measuring 22 mm.  Inflammatory infiltration or edema in the right upper quadrant abdominal fat in the gallbladder fossa.  The gallbladder is surgically absent.  The pancreas is somewhat atrophic.  No pancreatic ductal dilatation.  The changes may represent sequela pancreatitis.  The liver, spleen, adrenal glands, kidneys, and retroperitoneal lymph nodes are unremarkable.  There is a gastrostomy tube  in place.  Postsurgical changes in the distal stomach.  Contrast material extends to the colon without obstruction.  Diverticulosis of the colon without inflammatory change.  Calcification of normal caliber abdominal aorta.  Pelvis:  Visualization of the low pelvis is limited due to streak artifact arising from the right hip prosthesis.  Nodular appearance of the uterus with calcifications consistent with  fibroids.  No abnormal adnexal masses.  The bladder wall does not appear thickened.  Diverticulosis of sigmoid colon without inflammatory change.  The appendix is normal.  Degenerative changes in the lumbar spine.  IMPRESSION: Interval removal of the anterior abdominal wall drainage catheter. There is recurrent infiltration, gas, and small loculated fluid collections in the anterior abdominal wall and extending to the liver edge.  Changes suggest recurrent infectious process and developing abscesses.  No evidence of bowel obstruction.  Original Report Authenticated By: Marlon Pel, M.D.    Anti-infectives: Anti-infectives     Start     Dose/Rate Route Frequency Ordered Stop   07/29/11 1130   ciprofloxacin (CIPRO) IVPB 400 mg        400 mg 200 mL/hr over 60 Minutes Intravenous Every 12 hours 07/29/11 1029     07/28/11 2300   ceFEPIme (MAXIPIME) 1 g in dextrose 5 % 50 mL IVPB  Status:  Discontinued        1 g 100 mL/hr over 30 Minutes Intravenous Every 12 hours 07/28/11 2230 07/29/11 1029   07/28/11 2245   vancomycin (VANCOCIN) IVPB 1000 mg/200 mL premix        1,000 mg 200 mL/hr over 60 Minutes Intravenous  Once 07/28/11 2230 07/29/11 0211   07/28/11 2245   metroNIDAZOLE (FLAGYL) IVPB 500 mg        500 mg 100 mL/hr over 60 Minutes Intravenous Every 8 hours 07/28/11 2230             Assessment/Plan  1. Bacteremia, presumed from line sepsis from previous PICC 2. Small wound on abdominal wall  Plan: 1. Change Cipro/Flagyl to po 2. Add IV Vanc for gram + cocci in blood cultures 3. Cont wound care 4. Suspect she should be able to return to SNF relatively soon.  LOS: 2 days    Raizy Auzenne E 07/30/2011

## 2011-07-30 NOTE — Consult Note (Signed)
ANTIBIOTIC CONSULT NOTE - INITIAL  Pharmacy Consult for Vancomycin  Indication: Gram-positive cocci bacteremia from PICC line  Allergies  Allergen Reactions  . Other Swelling    Pinto beans cause swelling and hives  . Penicillins Swelling    Patient Measurements: Height: 5' 6.5" (168.9 cm) Weight: 162 lb (73.483 kg) IBW/kg (Calculated) : 60.45    Vital Signs: Temp: 100.1 F (37.8 C) (01/31 0635) BP: 149/67 mmHg (01/31 0635) Pulse Rate: 108  (01/31 0635) Intake/Output from previous day: 01/30 0701 - 01/31 0700 In: 1715 [P.O.:120; I.V.:474; IV Piggyback:100; TPN:1021] Out: -  Intake/Output from this shift:    Labs:  Basename 07/30/11 0630 07/29/11 1200 07/28/11 2212  WBC 9.3 13.5* 13.6*  HGB 8.5* 9.5* 11.3*  PLT 177 163 184  LABCREA -- -- --  CREATININE 0.55 0.54 0.69   Estimated Creatinine Clearance: 61.1 ml/min (by C-G formula based on Cr of 0.55).  Microbiology: Recent Results (from the past 720 hour(s))  URINE CULTURE     Status: Normal   Collection Time   07/01/11 11:06 PM      Component Value Range Status Comment   Specimen Description URINE, RANDOM   Final    Special Requests NONE   Final    Culture  Setup Time [id number]   Final    Colony Count >=100,000 COLONIES/ML   Final    Culture YEAST   Final    Report Status 07/02/2011 FINAL   Final   CLOSTRIDIUM DIFFICILE BY PCR     Status: Normal   Collection Time   07/02/11  1:04 AM      Component Value Range Status Comment   C difficile by pcr NEGATIVE  NEGATIVE  Final   CULTURE, BLOOD (ROUTINE X 2)     Status: Normal (Preliminary result)   Collection Time   07/28/11 11:30 PM      Component Value Range Status Comment   Specimen Description BLOOD RIGHT HAND   Final    Special Requests BOTTLES DRAWN AEROBIC ONLY 4CC   Final    Culture  Setup Time [id number]   Final    Culture     Final    Value: GRAM POSITIVE COCCI IN CLUSTERS     Note: Gram Stain Report Called to,Read Back By and Verified With: PATTY  MOSS 07/30/11 0900 BY SMITHERSJ   Report Status PENDING   Incomplete   CULTURE, BLOOD (ROUTINE X 2)     Status: Normal (Preliminary result)   Collection Time   07/28/11 11:42 PM      Component Value Range Status Comment   Specimen Description BLOOD HAND LEFT   Final    Special Requests BOTTLES DRAWN AEROBIC ONLY 5CC   Final    Culture  Setup Time [id number]   Final    Culture     Final    Value: GRAM POSITIVE COCCI IN CLUSTERS     Note: Gram Stain Report Called to,Read Back By and Verified With: PATTI MOSS [id number] 07/30/2011 BY WOODB   Report Status PENDING   Incomplete   WOUND CULTURE     Status: Normal (Preliminary result)   Collection Time   07/29/11  1:08 PM      Component Value Range Status Comment   Specimen Description WOUND ABDOMEN   Final    Special Requests NONE   Final    Gram Stain     Final    Value: RARE WBC PRESENT, PREDOMINANTLY PMN     NO SQUAMOUS  EPITHELIAL CELLS SEEN     NO ORGANISMS SEEN   Culture NO GROWTH   Final    Report Status PENDING   Incomplete   MRSA PCR SCREENING     Status: Normal   Collection Time   07/29/11  8:56 PM      Component Value Range Status Comment   MRSA by PCR NEGATIVE  NEGATIVE  Final     Medical History: Past Medical History  Diagnosis Date  . Hypertension   . Reflux   . Right elbow pain     OTIF  . Dementia   . Depression   . Osteoarthritis   . Pancreatitis 11/2007    HOP  . Angiomyolipoma of kidney     right  . Ulcerative esophagitis 12/05/2007    hx elevated gastrin, severe on EGD by Dr Jena Gauss , h pylori negative  . Hiatal hernia   . S/P colonoscopy 2009    pt reports normal by Dr Lovell Sheehan  . Diabetes mellitus   . Anemia   . Hyponatremia   . Cellulitis   . Esophagitis     Medications:  Prescriptions prior to admission  Medication Sig Dispense Refill  . acetaminophen (TYLENOL) 500 MG tablet Take 1 tablet (500 mg total) by mouth 3 (three) times daily as needed for pain.  60 tablet  0  . amLODipine (NORVASC) 5 MG tablet  Take 1 tablet (5 mg total) by mouth daily.  30 tablet  3  . benazepril (LOTENSIN) 20 MG tablet Take 20 mg by mouth daily.        . butalbital-acetaminophen-caffeine (FIORICET) 50-325-40 MG per tablet Take 1-2 tablets by mouth every 8 (eight) hours as needed for headache.  20 tablet  0  . calcium-vitamin D (OSCAL WITH D) 500-200 MG-UNIT per tablet Take 1 tablet by mouth 2 (two) times daily.       . cloNIDine (CATAPRES - DOSED IN MG/24 HR) 0.1 mg/24hr patch Place 1 patch onto the skin once a week.      . darifenacin (ENABLEX) 15 MG 24 hr tablet Take 15 mg by mouth daily.        . insulin aspart (NOVOLOG) 100 UNIT/ML injection Inject 0-9 Units into the skin 3 (three) times daily before meals. Sliding scale-give 3 units sq for CBG >150      . Loperamide HCl (IMODIUM PO) Take 2 mg by mouth every 4 (four) hours as needed. For diarrhea      . metFORMIN (GLUCOPHAGE) 1000 MG tablet Take 1,000 mg by mouth daily with breakfast.       . metoprolol tartrate (LOPRESSOR) 12.5 mg TABS Take 12.5 mg by mouth 2 (two) times daily.      . Multiple Vitamin (MULTIVITAMIN) tablet Take 1 tablet by mouth daily.       . ondansetron (ZOFRAN) 4 MG tablet Take 4 mg by mouth every 8 (eight) hours as needed. For nausea        . oxyCODONE (ROXICODONE) 5 MG/5ML solution Take 5 mg by mouth every 4 (four) hours as needed. For pain      . pantoprazole (PROTONIX) 40 MG tablet Take 40 mg by mouth 2 (two) times daily before a meal.      . QUEtiapine (SEROQUEL) 25 MG tablet Take 25 mg by mouth at bedtime.        . Ensure Plus (ENSURE PLUS) LIQD Take 1 Can by mouth daily.  30 Can  2  . fat emulsion 20 % infusion  Inject 240 mLs into the vein continuous.  250 mL  0  . feeding supplement (RESOURCE BREEZE) LIQD Take 1 Container by mouth 3 (three) times daily with meals.  10 Container  0   Scheduled:    . amLODipine  5 mg Oral Daily  . benazepril  20 mg Oral Daily  . ciprofloxacin  500 mg Oral BID  . cloNIDine  0.1 mg Transdermal Weekly    . darifenacin  15 mg Oral Daily  . Ensure Plus  1 Can Oral Daily  . feeding supplement  1 Container Oral TID WC  . heparin  5,000 Units Subcutaneous Q8H  . insulin aspart  0-15 Units Subcutaneous Q4H  . magnesium sulfate 1 - 4 g bolus IVPB  2 g Intravenous Once  . metoprolol tartrate  12.5 mg Oral BID  . metroNIDAZOLE  500 mg Oral Q8H  . pantoprazole  40 mg Oral BID AC  . potassium chloride  10 mEq Intravenous Q1 Hr x 4  . QUEtiapine  25 mg Oral QHS  . DISCONTD: ceFEPime (MAXIPIME) IV  1 g Intravenous Q12H  . DISCONTD: ciprofloxacin  400 mg Intravenous Q12H  . DISCONTD: insulin aspart  0-15 Units Subcutaneous TID WC  . DISCONTD: insulin aspart  0-9 Units Subcutaneous TID AC  . DISCONTD: metronidazole  500 mg Intravenous Q8H   Infusions:    . 0.9 % NaCl with KCl 20 mEq / L 50 mL/hr at 07/29/11 1143  . 0.9 % NaCl with KCl 20 mEq / L    . TPN (CLINIMIX) +/- additives 80 mL/hr at 07/29/11 1822   And  . fat emulsion 250 mL (07/29/11 1822)  . TPN (CLINIMIX) +/- additives    . DISCONTD: fat emulsion     Anti-infectives     Start     Dose/Rate Route Frequency Ordered Stop   07/30/11 1400   metroNIDAZOLE (FLAGYL) tablet 500 mg        500 mg Oral 3 times per day 07/30/11 0829     07/30/11 0930   ciprofloxacin (CIPRO) tablet 500 mg        500 mg Oral 2 times daily 07/30/11 0829     07/29/11 1130   ciprofloxacin (CIPRO) IVPB 400 mg  Status:  Discontinued        400 mg 200 mL/hr over 60 Minutes Intravenous Every 12 hours 07/29/11 1029 07/30/11 0829   07/28/11 2300   ceFEPIme (MAXIPIME) 1 g in dextrose 5 % 50 mL IVPB  Status:  Discontinued        1 g 100 mL/hr over 30 Minutes Intravenous Every 12 hours 07/28/11 2230 07/29/11 1029   07/28/11 2245   vancomycin (VANCOCIN) IVPB 1000 mg/200 mL premix        1,000 mg 200 mL/hr over 60 Minutes Intravenous  Once 07/28/11 2230 07/29/11 0211   07/28/11 2245   metroNIDAZOLE (FLAGYL) IVPB 500 mg  Status:  Discontinued        500 mg 100  mL/hr over 60 Minutes Intravenous Every 8 hours 07/28/11 2230 07/30/11 0829         Assessment: 1. Bacteremia, presumed from line sepsis from previous PICC 2. Patient currently on chronic TNA for short bowel.  Goal of Therapy:  Vancomycin trough level 15-20 mcg/ml  Plan:  1.  Vancomycin 750 mg IV q 12 hrs. 2.  Transition to oral antibiotic on discharge.  Mera Gunkel J 07/30/2011,9:56 AM

## 2011-07-30 NOTE — Progress Notes (Signed)
Patient interviewed and examined. She is clearly feeling better and much more alert.  I agree with the assessment and care plan as outlined by Ms. Osborne.  Gram-positive bacteremia almost certainly due to PICC line, which is now changed out.  Hopefully discharge home tomorrow on same therapies plus oral antibiotics for gram-positive cocci. Follow-up with Dr. Karie Soda in office for long-term care management.  Angelia Mould. Derrell Lolling, M.D., [facility] General and Minimally invasive Surgery Breast and Colorectal Surgery Office:   [phone] Pager:   [phone]

## 2011-07-31 DIAGNOSIS — K651 Peritoneal abscess: Secondary | ICD-10-CM

## 2011-07-31 LAB — GLUCOSE, CAPILLARY
Glucose-Capillary: 131 mg/dL — ABNORMAL HIGH (ref 70–99)
Glucose-Capillary: 144 mg/dL — ABNORMAL HIGH (ref 70–99)
Glucose-Capillary: 160 mg/dL — ABNORMAL HIGH (ref 70–99)
Glucose-Capillary: 181 mg/dL — ABNORMAL HIGH (ref 70–99)

## 2011-07-31 LAB — BASIC METABOLIC PANEL
BUN: 13 mg/dL (ref 6–23)
CO2: 19 mEq/L (ref 19–32)
GFR calc non Af Amer: >90 mL/min (ref >90)
Glucose, Bld: 153 mg/dL — ABNORMAL HIGH (ref 70–99)
Potassium: 3.1 mEq/L — ABNORMAL LOW (ref 3.5–5.1)
Sodium: 129 mEq/L — ABNORMAL LOW (ref 135–145)

## 2011-07-31 LAB — CBC
HCT: 24.6 % — ABNORMAL LOW (ref 36.0–46.0)
Hemoglobin: 8.5 g/dL — ABNORMAL LOW (ref 12.0–15.0)
MCHC: 34.6 g/dL (ref 30.0–36.0)
RBC: 3.14 MIL/uL — ABNORMAL LOW (ref 3.87–5.11)

## 2011-07-31 LAB — PHOSPHORUS: Phosphorus: 3.9 mg/dL (ref 2.3–4.6)

## 2011-07-31 MED ORDER — POTASSIUM CHLORIDE 10 MEQ/100ML IV SOLN
10.0000 meq | INTRAVENOUS | Status: AC
  Administered 2011-07-31 (×4): 10 meq via INTRAVENOUS
  Filled 2011-07-31: qty 100

## 2011-07-31 MED ORDER — TRACE MINERALS CR-CU-MN-SE-ZN 10-1000-500-60 MCG/ML IV SOLN
INTRAVENOUS | Status: AC
  Administered 2011-07-31: 18:00:00 via INTRAVENOUS
  Filled 2011-07-31: qty 2000

## 2011-07-31 MED ORDER — MAGNESIUM SULFATE 40 MG/ML IJ SOLN
2.0000 g | Freq: Once | INTRAMUSCULAR | Status: AC
  Administered 2011-07-31: 2 g via INTRAVENOUS
  Filled 2011-07-31: qty 50

## 2011-07-31 MED ORDER — FAT EMULSION 20 % IV EMUL
250.0000 mL | INTRAVENOUS | Status: AC
  Administered 2011-07-31: 250 mL via INTRAVENOUS
  Filled 2011-07-31: qty 250

## 2011-07-31 NOTE — Progress Notes (Signed)
PARENTERAL NUTRITION CONSULT NOTE - FOLLOW UP   Pharmacy Consult for TPN Indication: Short gut  Allergies  Allergen Reactions  . Other Swelling    Pinto beans cause swelling and hives  . Penicillins Swelling    Patient Measurements: Height: 5' 6.5" (168.9 cm) Weight: 162 lb (73.483 kg) IBW/kg (Calculated) : 60.45  Usual Weight: 73.5  Vital Signs: Temp: 99.2 F (37.3 C) (02/01 1013) Temp src: Oral (02/01 1013) BP: 126/60 mmHg (02/01 1013) Pulse Rate: 95  (02/01 1013) Intake/Output from previous day: 01/31 0701 - 02/01 0700 In: 3207 [P.O.:837; I.V.:320; IV Piggyback:150; TPN:1900] Out: -  Intake/Output from this shift:    Labs:  Basename 07/31/11 0615 07/30/11 0630 07/29/11 1200  WBC 8.6 9.3 13.5*  HGB 8.5* 8.5* 9.5*  HCT 24.6* 25.4* 28.8*  PLT 197 177 163  APTT -- -- --  INR -- -- --     Basename 07/31/11 0615 07/30/11 0850 07/30/11 0630 07/29/11 1200 07/28/11 2212  NA 129* -- 128* -- 123*  K 3.1* -- 3.0* -- 4.3  CL 100 -- 96 -- 90*  CO2 19 -- 21 -- 23  GLUCOSE 153* -- 180* -- 170*  BUN 13 -- 11 -- 25*  CREATININE 0.48* -- 0.55 0.54 --  LABCREA -- -- -- -- --  CREAT24HRUR -- -- -- -- --  CALCIUM 8.2* -- 8.4 -- 9.8  MG 1.6 -- 1.5 -- --  PHOS 3.9 -- 3.9 -- --  PROT -- -- 6.2 -- 7.8  ALBUMIN -- -- 1.9* -- 2.7*  AST -- -- 29 -- 35  ALT -- -- 24 -- 39*  ALKPHOS -- -- 125* -- 161*  BILITOT -- -- 0.3 -- 0.3  BILIDIR -- -- -- -- 0.1  IBILI -- -- -- -- 0.2*  PREALBUMIN -- -- 6.0* -- --  TRIG -- 84 -- -- --  CHOLHDL -- -- -- -- --  CHOL -- 75 -- -- --   Estimated Creatinine Clearance: 61.1 ml/min (by C-G formula based on Cr of 0.48).    Basename 07/31/11 0809 07/31/11 0343 07/31/11 0004  GLUCAP 182* 131* 160*    Insulin Requirements in the past 24 hours:  13 units ssi (currently 40 units in TNA)  Current Nutrition:  PO: carb modified diet started 1/30: 120 cc po recorded Clinimix E 5/15 at 80 ml/hr and lipids at 10 ml/hr MWF: provides weekly  average of 1569 kcl and 96  protein per day.  Assessment: [name] admitted from [facility] where she is on chronic TPN and takes POs only for pleasure.  History of erosive esophagitis with perf pyloric ulcer s/p lap 11/7. s/p gastrostomy, gastrectomy, choleycystectomy. Last d/c 07/06/11. 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  Continue clinimix E5/15 at 80 ml/hr 2.  Increase insulin in tna to 45 units/bag 3.  Continue lipids, MVI, TE, MWF only due to national shortage 4. Unable to correct sodium with pre-made clinimix, 4 runs KCL per MD  5. F/U with AM labs   Phillips Climes, PharmD 07/31/2011 10:52 AM

## 2011-07-31 NOTE — Progress Notes (Signed)
Subjective: Sitting up in bed, no complaints.  Objective: Vital signs in last 24 hours: Temp:  [98.4 F (36.9 C)-100.8 F (38.2 C)] 99.1 F (37.3 C) (02/01 0519) Pulse Rate:  [85-114] 93  (02/01 0519) Resp:  [18-28] 18  (02/01 0519) BP: (105-162)/(42-78) 162/78 mmHg (02/01 0519) SpO2:  [95 %-98 %] 95 % (02/01 0519) Last BM Date: 07/30/11  Intake/Output from previous day: 01/31 0701 - 02/01 0700 In: 3207 [P.O.:837; I.V.:320; IV Piggyback:150; TPN:1900] Out: -  Intake/Output this shift:    PE:  Elderly debilitated woman, alert, at her baseline, no acute distress.  Chest: clear Abd: open wound packed wet to dry seen by R arm OK.   Lab Results:   Basename 07/31/11 0615 07/30/11 0630  WBC 8.6 9.3  HGB 8.5* 8.5*  HCT 24.6* 25.4*  PLT 197 177    Lab 07/30/11 0630 07/28/11 2212  AST 29 35  ALT 24 39*  ALKPHOS 125* 161*  BILITOT 0.3 0.3  PROT 6.2 7.8  ALBUMIN 1.9* 2.7*    BMET  Basename 07/31/11 0615 07/30/11 0630  NA 129* 128*  K 3.1* 3.0*  CL 100 96  CO2 19 21  GLUCOSE 153* 180*  BUN 13 11  CREATININE 0.48* 0.55  CALCIUM 8.2* 8.4   PT/INR No results found for this basename: LABPROT:2,INR:2 in the last 72 hours   Studies/Results: No results found.  Anti-infectives: Anti-infectives     Start     Dose/Rate Route Frequency Ordered Stop   07/30/11 1400   metroNIDAZOLE (FLAGYL) tablet 500 mg        500 mg Oral 3 times per day 07/30/11 0829     07/30/11 1100   vancomycin (VANCOCIN) 750 mg in sodium chloride 0.9 % 150 mL IVPB        750 mg 150 mL/hr over 60 Minutes Intravenous Every 12 hours 07/30/11 1008     07/30/11 0930   ciprofloxacin (CIPRO) tablet 500 mg        500 mg Oral 2 times daily 07/30/11 0829     07/29/11 1130   ciprofloxacin (CIPRO) IVPB 400 mg  Status:  Discontinued        400 mg 200 mL/hr over 60 Minutes Intravenous Every 12 hours 07/29/11 1029 07/30/11 0829   07/28/11 2300   ceFEPIme (MAXIPIME) 1 g in dextrose 5 % 50 mL IVPB  Status:   Discontinued        1 g 100 mL/hr over 30 Minutes Intravenous Every 12 hours 07/28/11 2230 07/29/11 1029   07/28/11 2245   vancomycin (VANCOCIN) IVPB 1000 mg/200 mL premix        1,000 mg 200 mL/hr over 60 Minutes Intravenous  Once 07/28/11 2230 07/29/11 0211   07/28/11 2245   metroNIDAZOLE (FLAGYL) IVPB 500 mg  Status:  Discontinued        500 mg 100 mL/hr over 60 Minutes Intravenous Every 8 hours 07/28/11 2230 07/30/11 0829         Current Facility-Administered Medications  Medication Dose Route Frequency Provider Last Rate Last Dose  . 0.9 % NaCl with KCl 20 mEq/ L  infusion   Intravenous Continuous Crystal [place], PHARMD 20 mL/hr at 07/31/11 0525    . acetaminophen (TYLENOL) tablet 500 mg  500 mg Oral TID PRN Letha Cape, PA   500 mg at 07/30/11 2139  . amLODipine (NORVASC) tablet 5 mg  5 mg Oral Daily Letha Cape, PA   5 mg at 07/30/11 1002  .  benazepril (LOTENSIN) tablet 20 mg  20 mg Oral Daily Letha Cape, PA   20 mg at 07/30/11 1002  . butalbital-acetaminophen-caffeine (FIORICET, ESGIC) 50-325-40 MG per tablet 1-2 tablet  1-2 tablet Oral Q8H PRN Letha Cape, PA      . ciprofloxacin (CIPRO) tablet 500 mg  500 mg Oral BID Letha Cape, PA   500 mg at 07/31/11 1610  . cloNIDine (CATAPRES - Dosed in mg/24 hr) patch 0.1 mg  0.1 mg Transdermal Weekly Letha Cape, PA   0.1 mg at 07/29/11 1207  . darifenacin (ENABLEX) 24 hr tablet 15 mg  15 mg Oral Daily Letha Cape, PA   15 mg at 07/30/11 1002  . diphenhydrAMINE (BENADRYL) injection 12.5 mg  12.5 mg Intravenous Q6H PRN Letha Cape, PA       Or  . diphenhydrAMINE (BENADRYL) 12.5 MG/5ML elixir 12.5 mg  12.5 mg Oral Q6H PRN Letha Cape, PA      . Ensure Plus (ENSURE PLUS) liquid 1 Can  1 Can Oral Daily Letha Cape, PA   1 Can at 07/30/11 1003  . tpn solution (CLINIMIX E 5/15) 2,000 mL with multivitamins adult 10 mL, trace elements Cr-Cu-Mn-Se-Zn 1 mL, insulin regular 30 Units infusion    Intravenous Continuous TPN Crystal [place], PHARMD 80 mL/hr at 07/29/11 1822     And  . fat emulsion 20 % infusion 250 mL  250 mL Intravenous Continuous TPN Crystal [place], PHARMD 10 mL/hr at 07/29/11 1822 250 mL at 07/29/11 1822  . feeding supplement (RESOURCE BREEZE) liquid 1 Container  1 Container Oral TID WC Letha Cape, PA   1 Container at 07/31/11 [phone]  . heparin injection 5,000 Units  5,000 Units Subcutaneous Q8H Letha Cape, PA   5,000 Units at 07/31/11 0522  . insulin aspart (novoLOG) injection 0-15 Units  0-15 Units Subcutaneous Q4H Crystal [place], [place]   3 Units at 07/31/11 5409  . loperamide (IMODIUM) capsule 2 mg  2 mg Oral PRN Letha Cape, PA      . magnesium sulfate IVPB 2 g 50 mL  2 g Intravenous Once Drusilla Kanner, PHARMD   2 g at 07/30/11 1139  . metoprolol tartrate (LOPRESSOR) tablet 12.5 mg  12.5 mg Oral BID Letha Cape, PA   12.5 mg at 07/30/11 2139  . metroNIDAZOLE (FLAGYL) tablet 500 mg  500 mg Oral Q8H Letha Cape, PA   500 mg at 07/31/11 0523  . ondansetron (ZOFRAN) tablet 4 mg  4 mg Oral Q8H PRN Letha Cape, PA      . oxyCODONE (Oxy IR/ROXICODONE) immediate release tablet 5 mg  5 mg Oral Q4H PRN Christella Hartigan, PHARMD   5 mg at 07/30/11 1101  . pantoprazole (PROTONIX) EC tablet 40 mg  40 mg Oral BID AC Letha Cape, PA   40 mg at 07/31/11 8119  . potassium chloride 10 mEq in 50 mL *CENTRAL LINE* IVPB  10 mEq Intravenous Q1 Hr x 4 Gildardo Cranker [place], [place]   10 mEq at 07/30/11 1608  . potassium chloride 10 mEq in 50 mL *CENTRAL LINE* IVPB  10 mEq Intravenous Once Avie Arenas, PHARMD   10 mEq at 07/30/11 2018  . QUEtiapine (SEROQUEL) tablet 25 mg  25 mg Oral QHS Letha Cape, PA   25 mg at 07/30/11 2139  . sodium chloride 0.9 % injection 10-40 mL  10-40 mL Intracatheter PRN  Letha Cape, PA   10 mL at 07/31/11 [phone]  . tpn solution (CLINIMIX E 5/15) 2,000 mL with insulin regular 40  Units infusion   Intravenous Continuous TPN Drusilla Kanner, PHARMD 80 mL/hr at 07/30/11 1802    . vancomycin (VANCOCIN) 750 mg in sodium chloride 0.9 % 150 mL IVPB  750 mg Intravenous Q12H Ernestene Mention, MD   750 mg at 07/30/11 2139    Assessment/Plan 1. Bacteremia, presumed from line sepsis from previous PICC  2. Small wound on abdominal wall  3.Hypokalemia 4.PCM/TNA 5.Hospatilized 05/06/11-07/06/11: Hypochloremia [276.9]  Hyponatremia [276.1]  Pancreatitis [577.0]  UTI (lower urinary tract infection) [599.0]  Perforated ulcer [533.50]  Headache  Free air abdomen  Discharge Diagnosis  . Septic shock -resolved  2. S/p VDRF  3. Pneumoperitoneum due to perforated peptic ulcer  4. S/p Ex-Lap with Lysis of adhesions, Antrectomy with duodenal resection, Billroth 2 reconstruction, omental patching of Duodenal bulb, Open Cholecystectomy, Gastrostomy and Jejunostomy on 11/08  5. Epigastric abscess s/p percutaneous drain per IR 12/02  6. Severe Protein calorie malnutrition  7. Diabetes Mellitus  8. Dementia  9. 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 Hypertension  12. Depression  13. 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  I have called. Replace K+, and Magnesium.  Back to SNF when that is finalized.   Final blood culture report is not back yet. Recheck labs in AM.   LOS: 3 days    [name] 07/31/2011

## 2011-07-31 NOTE — Consult Note (Signed)
Date of Admission:  07/28/2011  Date of Consult:  07/31/2011  Reason for Consult:Bacteremia Referring Physician: Derrell Lolling  Impression/Recommendation PIC related bacteremia Abd wound infection Would- continue vancomycin until Cx show otherwise. Plan for 2 weeks course (at minimum) Re-check BCx for clearance Comment- if she has MRSA in her BCx she is at high risk for endocarditis. Likewise, Staph aureus carries a high risk as well (~25%). A TEE would be indicated for either of these pathogens and if (+)  for endocarditis, 6 weeks of rx. If (-), 2 weeks. I am not sure she is a good candidate for a TEE. Could consider asking CV for their opinion as this could alter the duration of her Rx. Given [facility]' expertise in handling her abdominal wound infection, will defer comment on this.   [name] is an 78 y.o. female.  HPI: [name] with hx of perforated ulcer,  Antrectomy with duodenal resection, Billroth 2 reconstruction, omental patching of Duodenal bulb, Open Cholecystectomy, Gastrostomy and Jejunostomy on 05/07/11. Her course was complicated by Epigastric abscess s/p percutaneous drain per IR 12/02. Her Cx grew She was seen 07-28-11 for increasing drainage from her wound. Was returned to SNF with wound care instructions. She returned to ED 07-29-11 with fever and vomitting. She has since been found to have S. Aureus bacteremia (2/2). She has had a f/u CT abd (07-29-11): Interval removal of the anterior abdominal wall drainage catheter. There is recurrent infiltration, gas, and small loculated fluid collections in the anterior abdominal wall and extending to the liver edge.  Changes suggest recurrent infectious process and developing abscesses.  No evidence of bowel obstruction.  PIC was replaced 07-28-11.    Past Medical History  Diagnosis Date  . Hypertension   . Reflux   . Right elbow pain     OTIF  . Dementia   . Depression   . Osteoarthritis   . Pancreatitis 11/2007    HOP  .  Angiomyolipoma of kidney     right  . Ulcerative esophagitis 12/05/2007    hx elevated gastrin, severe on EGD by Dr Jena Gauss , h pylori negative  . Hiatal hernia   . S/P colonoscopy 2009    pt reports normal by Dr Lovell Sheehan  . Diabetes mellitus   . Anemia   . Hyponatremia   . Cellulitis   . Esophagitis     Past Surgical History  Procedure Date  . Orif right hip 1999    [facility]  . Umbilical hernia repair 35 months old    [place]  . Esophagogastroduodenoscopy 01/28/2011    Procedure: ESOPHAGOGASTRODUODENOSCOPY (EGD);  Surgeon: Arlyce Harman, MD;  Location: [facility];  Service: Endoscopy;  Laterality: N/A;  . Colonoscopy 01/28/2011    Procedure: COLONOSCOPY;  Surgeon: Arlyce Harman, MD;  Location: [facility];  Service: Endoscopy;  Laterality: N/A;  . Laparotomy 02/04/2011    Procedure: EXPLORATORY LAPAROTOMY;  Surgeon: Fabio Bering;  Location: [facility] ORS;  Service: General;  Laterality: N/A;  . Laparotomy 05/07/2011    Procedure: EXPLORATORY LAPAROTOMY;  Surgeon: Ardeth Sportsman, MD;  Location: [facility] OR;  Service: General;  Laterality: N/A;  lysis of adhesions  . Gastrostomy 05/07/2011    Procedure: GASTROSTOMY;  Surgeon: Ardeth Sportsman, MD;  Location: [facility] OR;  Service: General;  Laterality: N/A;  g-tube / j -tube placement  . Gastrectomy 05/07/2011    Procedure: GASTRECTOMY;  Surgeon: Ardeth Sportsman, MD;  Location[facility] OR;  Service: General;  Laterality: N/A;  Partial gastrectomy  . Cholecystectomy 05/07/2011    Procedure: CHOLECYSTECTOMY;  Surgeon: Ardeth Sportsman, MD;  Location: [facility] OR;  Service: General;  Laterality: N/A;  open  ergies:   Allergies  Allergen Reactions  . Other Swelling    Pinto beans cause swelling and hives  . Penicillins Swelling    Medications:  Scheduled:   . amLODipine  5 mg Oral Daily  . benazepril  20 mg Oral Daily  . ciprofloxacin  500 mg Oral BID  . cloNIDine  0.1 mg Transdermal Weekly  . darifenacin  15 mg Oral Daily  . Ensure Plus  1 Can Oral Daily  .  feeding supplement  1 Container Oral TID WC  . heparin  5,000 Units Subcutaneous Q8H  . insulin aspart  0-15 Units Subcutaneous Q4H  . magnesium sulfate 1 - 4 g bolus IVPB  2 g Intravenous Once  . metoprolol tartrate  12.5 mg Oral BID  . metroNIDAZOLE  500 mg Oral Q8H  . pantoprazole  40 mg Oral BID AC  . potassium chloride  10 mEq Intravenous Q1 Hr x 4  . potassium chloride  10 mEq Intravenous Q1 Hr x 4  . potassium chloride  10 mEq Intravenous Once  . QUEtiapine  25 mg Oral QHS  . vancomycin (VANCOCIN) 750 mg IVPB  750 mg Intravenous Q12H    Social History:  reports that she has never smoked. Her smokeless tobacco use includes Chew. She reports that she does not drink alcohol or use illicit drugs.  Family History  Problem Relation Age of Onset  . Cancer Mother     pelvic     History obtained from chart review  Blood pressure 126/60, pulse 95, temperature 99.2 F (37.3 C), temperature source Oral, resp. rate 18, height 5' 6.5" (1.689 m), weight 73.483 kg (162 lb), SpO2 97.00%. General appearance: alert, appears stated age and no distress Eyes: negative findings: pupils equal, round, reactive to light and accomodation, bilateral arcus senilus Neck: no adenopathy and mouth dry,  Lungs: clear to auscultation bilaterally Heart: regular rate and rhythm Abdomen: normal findings: bowel sounds normal and soft, non-tender and abnormal findings:  she has a mid line scar. at the top of this there is a ~1cm wound that is packed. in her RUQ there is a ~1cm scabbed wound neither have d/c. her abd is warm. she has a L abd PEG tube.  Extremities: edema none and LUE PIC is clean, nontender.    Results for orders placed during the hospital encounter of 07/28/11 (from the past 48 hour(s))  GLUCOSE, CAPILLARY     Status: Abnormal   Collection Time   07/29/11  5:19 PM      Component Value Range Comment   Glucose-Capillary 132 (*) 70 - 99 (mg/dL)    Comment 1 Notify RN     MRSA PCR SCREENING      Status: Normal   Collection Time   07/29/11  8:56 PM      Component Value Range Comment   MRSA by PCR NEGATIVE  NEGATIVE    GLUCOSE, CAPILLARY     Status: Abnormal   Collection Time   07/29/11 10:04 PM      Component Value Range Comment   Glucose-Capillary 153 (*) 70 - 99 (mg/dL)   GLUCOSE, CAPILLARY     Status: Abnormal   Collection Time   07/30/11  1:53 AM      Component Value Range Comment   Glucose-Capillary 130 (*)  70 - 99 (mg/dL)   GLUCOSE, CAPILLARY     Status: Abnormal   Collection Time   07/30/11  6:01 AM      Component Value Range Comment   Glucose-Capillary 172 (*) 70 - 99 (mg/dL)   COMPREHENSIVE METABOLIC PANEL     Status: Abnormal   Collection Time   07/30/11  6:30 AM      Component Value Range Comment   Sodium 128 (*) 135 - 145 (mEq/L)    Potassium 3.0 (*) 3.5 - 5.1 (mEq/L)    Chloride 96  96 - 112 (mEq/L)    CO2 21  19 - 32 (mEq/L)    Glucose, Bld 180 (*) 70 - 99 (mg/dL)    BUN 11  6 - 23 (mg/dL)    Creatinine, Ser 1.61  0.50 - 1.10 (mg/dL)    Calcium 8.4  8.4 - 10.5 (mg/dL)    Total Protein 6.2  6.0 - 8.3 (g/dL)    Albumin 1.9 (*) 3.5 - 5.2 (g/dL)    AST 29  0 - 37 (U/L)    ALT 24  0 - 35 (U/L)    Alkaline Phosphatase 125 (*) 39 - 117 (U/L)    Total Bilirubin 0.3  0.3 - 1.2 (mg/dL)    GFR calc non Af Amer 88 (*) >90 (mL/min)    GFR calc Af Amer >90  >90 (mL/min)   PREALBUMIN     Status: Abnormal   Collection Time   07/30/11  6:30 AM      Component Value Range Comment   Prealbumin 6.0 (*) 17.0 - 34.0 (mg/dL)   MAGNESIUM     Status: Normal   Collection Time   07/30/11  6:30 AM      Component Value Range Comment   Magnesium 1.5  1.5 - 2.5 (mg/dL)   PHOSPHORUS     Status: Normal   Collection Time   07/30/11  6:30 AM      Component Value Range Comment   Phosphorus 3.9  2.3 - 4.6 (mg/dL)   CBC     Status: Abnormal   Collection Time   07/30/11  6:30 AM      Component Value Range Comment   WBC 9.3  4.0 - 10.5 (K/uL)    RBC 3.23 (*) 3.87 - 5.11 (MIL/uL)     Hemoglobin 8.5 (*) 12.0 - 15.0 (g/dL)    HCT 09.6 (*) 04.5 - 46.0 (%)    MCV 78.6  78.0 - 100.0 (fL)    MCH 26.3  26.0 - 34.0 (pg)    MCHC 33.5  30.0 - 36.0 (g/dL)    RDW 40.9  81.1 - 91.4 (%)    Platelets 177  150 - 400 (K/uL)   DIFFERENTIAL     Status: Normal   Collection Time   07/30/11  6:30 AM      Component Value Range Comment   Neutrophils Relative 70  43 - 77 (%)    Neutro Abs 6.5  1.7 - 7.7 (K/uL)    Lymphocytes Relative 22  12 - 46 (%)    Lymphs Abs 2.0  0.7 - 4.0 (K/uL)    Monocytes Relative 8  3 - 12 (%)    Monocytes Absolute 0.8  0.1 - 1.0 (K/uL)    Eosinophils Relative 0  0 - 5 (%)    Eosinophils Absolute 0.0  0.0 - 0.7 (K/uL)    Basophils Relative 0  0 - 1 (%)    Basophils Absolute 0.0  0.0 - 0.1 (K/uL)   CHOLESTEROL, TOTAL     Status: Normal   Collection Time   07/30/11  8:50 AM      Component Value Range Comment   Cholesterol 75  0 - 200 (mg/dL)   TRIGLYCERIDES     Status: Normal   Collection Time   07/30/11  8:50 AM      Component Value Range Comment   Triglycerides 84  <150 (mg/dL)   GLUCOSE, CAPILLARY     Status: Abnormal   Collection Time   07/30/11 12:49 PM      Component Value Range Comment   Glucose-Capillary 152 (*) 70 - 99 (mg/dL)    Comment 1 Notify RN     GLUCOSE, CAPILLARY     Status: Abnormal   Collection Time   07/30/11  5:22 PM      Component Value Range Comment   Glucose-Capillary 172 (*) 70 - 99 (mg/dL)    Comment 1 Notify RN     GLUCOSE, CAPILLARY     Status: Abnormal   Collection Time   07/30/11  7:42 PM      Component Value Range Comment   Glucose-Capillary 165 (*) 70 - 99 (mg/dL)   GLUCOSE, CAPILLARY     Status: Abnormal   Collection Time   07/31/11 12:04 AM      Component Value Range Comment   Glucose-Capillary 160 (*) 70 - 99 (mg/dL)   GLUCOSE, CAPILLARY     Status: Abnormal   Collection Time   07/31/11  3:43 AM      Component Value Range Comment   Glucose-Capillary 131 (*) 70 - 99 (mg/dL)   CBC     Status: Abnormal   Collection  Time   07/31/11  6:15 AM      Component Value Range Comment   WBC 8.6  4.0 - 10.5 (K/uL)    RBC 3.14 (*) 3.87 - 5.11 (MIL/uL)    Hemoglobin 8.5 (*) 12.0 - 15.0 (g/dL)    HCT 59.5 (*) 63.8 - 46.0 (%)    MCV 78.3  78.0 - 100.0 (fL)    MCH 27.1  26.0 - 34.0 (pg)    MCHC 34.6  30.0 - 36.0 (g/dL)    RDW 75.6 (*) 43.3 - 15.5 (%)    Platelets 197  150 - 400 (K/uL)   BASIC METABOLIC PANEL     Status: Abnormal   Collection Time   07/31/11  6:15 AM      Component Value Range Comment   Sodium 129 (*) 135 - 145 (mEq/L)    Potassium 3.1 (*) 3.5 - 5.1 (mEq/L)    Chloride 100  96 - 112 (mEq/L)    CO2 19  19 - 32 (mEq/L)    Glucose, Bld 153 (*) 70 - 99 (mg/dL)    BUN 13  6 - 23 (mg/dL)    Creatinine, Ser 2.95 (*) 0.50 - 1.10 (mg/dL)    Calcium 8.2 (*) 8.4 - 10.5 (mg/dL)    GFR calc non Af Amer >90  >90 (mL/min)    GFR calc Af Amer >90  >90 (mL/min)   MAGNESIUM     Status: Normal   Collection Time   07/31/11  6:15 AM      Component Value Range Comment   Magnesium 1.6  1.5 - 2.5 (mg/dL)   PHOSPHORUS     Status: Normal   Collection Time   07/31/11  6:15 AM      Component Value Range Comment  Phosphorus 3.9  2.3 - 4.6 (mg/dL)   GLUCOSE, CAPILLARY     Status: Abnormal   Collection Time   07/31/11  8:09 AM      Component Value Range Comment   Glucose-Capillary 182 (*) 70 - 99 (mg/dL)    Comment 1 Notify RN     GLUCOSE, CAPILLARY     Status: Abnormal   Collection Time   07/31/11 12:18 PM      Component Value Range Comment   Glucose-Capillary 181 (*) 70 - 99 (mg/dL)    Comment 1 Notify RN     GLUCOSE, CAPILLARY     Status: Abnormal   Collection Time   07/31/11  3:41 PM      Component Value Range Comment   Glucose-Capillary 153 (*) 70 - 99 (mg/dL)       Component Value Date/Time   SDES WOUND ABDOMEN 07/29/2011 1308   SPECREQUEST NONE 07/29/2011 1308   CULT NO GROWTH 2 DAYS 07/29/2011 1308   REPTSTATUS PENDING 07/29/2011 1308   No results found.  Thank you so much for this interesting consult,    [name] [phone] 07/31/2011, 5:01 PM

## 2011-07-31 NOTE — Progress Notes (Signed)
The patient is seen and examined. I agree with the assessment and care plan as outlined by Mr. Marlyne Beards.  We will request infectious disease consult to advise regarding antibiotic selection and duration for line sepsis.  Once this is done hopefully she can get back to the nursing home and followup with [name]. Angelia Mould. Derrell Lolling, M.D., [facility] General and Minimally invasive Surgery Breast and Colorectal Surgery Office:   [phone] Pager:   [phone]

## 2011-07-31 NOTE — Evaluation (Signed)
Physical Therapy Evaluation and Discharge [name] MRN: [id number] DOB: [birth year] Today's Date: 07/31/2011  Problem List:  Patient Active Problem List  Diagnoses  . DIABETES MELLITUS, TYPE II  . HYPERLIPIDEMIA  . Obesity, unspecified  . DEPRESSION  . HYPERTENSION  . DIVERTICULOSIS OF COLON  . ACUTE CYSTITIS  . HEMATOMA  . Anemia  . Hyponatremia  . Perforated prepyloric gastric ulcer s/p antrectomy with Billroth II reconstruction  . Ulcer, colon  . Acute renal failure  . Confusion  . Metabolic acidosis  . Esophagitis, erosive  . 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 Wound healing, delayed    Past Medical History:  Past Medical History  Diagnosis Date  . Hypertension   . Reflux   . Right elbow pain     OTIF  . Dementia   . Depression   . Osteoarthritis   . Pancreatitis 11/2007    HOP  . Angiomyolipoma of kidney     right  . Ulcerative esophagitis 12/05/2007    hx elevated gastrin, severe on EGD by Dr Jena Gauss , h pylori negative  . Hiatal hernia   . S/P colonoscopy 2009    pt reports normal by Dr Lovell Sheehan  . Diabetes mellitus   . Anemia   . Hyponatremia   . Cellulitis   . Esophagitis    Past Surgical History:  Past Surgical History  Procedure Date  . Orif right hip 1999    [facility]  . Umbilical hernia repair 31 months old    [place]  . Esophagogastroduodenoscopy 01/28/2011    Procedure: ESOPHAGOGASTRODUODENOSCOPY (EGD);  Surgeon: Arlyce Harman, MD;  Location: [facility];  Service: Endoscopy;  Laterality: N/A;  . Colonoscopy 01/28/2011    Procedure: COLONOSCOPY;  Surgeon: Arlyce Harman, MD;  Location: [facility];  Service: Endoscopy;  Laterality: N/A;  . Laparotomy 02/04/2011    Procedure: EXPLORATORY LAPAROTOMY;  Surgeon: Fabio Bering;  Location: [facility] ORS;  Service: General;  Laterality: N/A;  . Laparotomy 05/07/2011    Procedure: EXPLORATORY LAPAROTOMY;  Surgeon: Ardeth Sportsman, MD;  Location: [facility] OR;   Service: General;  Laterality: N/A;  lysis of adhesions  . Gastrostomy 05/07/2011    Procedure: GASTROSTOMY;  Surgeon: Ardeth Sportsman, MD;  Location: [facility] OR;  Service: General;  Laterality: N/A;  g-tube / j -tube placement  . Gastrectomy 05/07/2011    Procedure: GASTRECTOMY;  Surgeon: Ardeth Sportsman, MD;  Location: [facility] OR;  Service: General;  Laterality: N/A;  Partial gastrectomy  . Cholecystectomy 05/07/2011    Procedure: CHOLECYSTECTOMY;  Surgeon: Ardeth Sportsman, MD;  Location: [facility] OR;  Service: General;  Laterality: N/A;  open    PT Assessment/Plan/Recommendation PT Assessment Clinical Impression Statement: Pt is a 78 y/o chronically debilitated female admited for abdominal abcess.  Pt is a resident at a SNF and will be returning upon discharge from hopital. Due to the chronic nature of  [name] functional  and cognitive impairments it is unlikely acute PT intervention will improve pt's mobiltiy.  Suggesting PT follow-up in SNF if pt is below her baseline level of function.  Instructed nurses in technique and amount of assistance pt requires for transfers.  Acute PT deferring  any further therapy to SNF. PT Recommendation/Assessment: All further PT needs can be met in the next venue of care PT Problem List: Decreased mobility;Decreased cognition;Decreased safety awareness PT Therapy Diagnosis : Other (comment) PT Recommendation Follow Up Recommendations: Skilled nursing facility  Equipment Recommended: Defer to next venue PT Goals  Acute Rehab PT Goals PT Goal Formulation: Patient unable to participate in goal setting  PT Evaluation Precautions/Restrictions  Precautions Precautions: Fall Restrictions Weight Bearing Restrictions: No Prior Functioning  Home Living Lives With: Other (Comment) Receives Help From: Other (Comment) Type of Home: Skilled Nursing Facility Home Layout: One level Home Access: Level entry Bathroom Shower/Tub: Walk-in shower;Curtain Bathroom Toilet:  Handicapped height Bathroom Accessibility: Yes How Accessible: Accessible via wheelchair;Accessible via walker Home Adaptive Equipment: Walker - rolling;Grab bars around toilet;Grab bars in shower;Hand-held shower hose;Bedside commode/3-in-1;Shower chair with back Prior Function Level of Independence: Other (comment);Needs assistance with tranfers;Needs assistance with ADLs;Needs assistance with gait (Unclear pt report independent with cane) Able to Take Stairs?: No Driving: No Vocation: Retired Leisure: Hobbies-no Cognition Cognition Arousal/Alertness: Awake/alert Overall Cognitive Status: Impaired Orientation Level: Oriented to person;Disoriented to place;Disoriented to time;Disoriented to situation Sensation/Coordination Sensation Light Touch: Appears Intact Stereognosis: Not tested Hot/Cold: Not tested Proprioception: Not tested Coordination Gross Motor Movements are Fluid and Coordinated: Yes Fine Motor Movements are Fluid and Coordinated: Not tested Extremity Assessment RUE Assessment RUE Assessment: Within Functional Limits LUE Assessment LUE Assessment: Within Functional Limits RLE Assessment RLE Assessment: Within Functional Limits LLE Assessment LLE Assessment: Within Functional Limits Mobility (including Balance) Bed Mobility Bed Mobility: Yes Supine to Sit: HOB flat;4: Min assist;Patient percentage (comment) (pt 80%) Supine to Sit Details (indicate cue type and reason): cues to initiate transistion. Assist to raise trunk from bed.   Transfers Transfers: Yes Sit to Stand: 4: Min assist;With upper extremity assist;From bed Sit to Stand Details (indicate cue type and reason): cues for hand placement, assist to stabilize pt and manage pt body wt to allow pt to extend bilateral knees and hips.   Stand to Sit: 4: Min assist;To chair/3-in-1;With upper extremity assist;With armrests Stand to Sit Details: Cues to prevent pt from sittiing prematurely.  Cues to place hands  on armrest of chair.  Assist for controlled descent. Stand Pivot Transfers: 2: Max assist;Patient percentage (comment) (pt 30%) Stand Pivot Transfer Details (indicate cue type and reason): Assist to manage walker, Assist to advance bilateral LEs, cues for sequencing, pt presents with flexed hip posture and unable to correct despite verbal and tactile cues.   Ambulation/Gait Ambulation/Gait: No Stairs: No Wheelchair Mobility Wheelchair Mobility: No  Posture/Postural Control Posture/Postural Control: Postural limitations Postural Limitations: Flexed hip posture in standing.  Balance Balance Assessed: No Exercise    End of Session PT - End of Session Equipment Utilized During Treatment: Gait belt Activity Tolerance: Patient limited by fatigue Patient left: in chair;with call bell in reach;Other (comment) (RN present with pt ) Nurse Communication: Mobility status for transfers General Behavior During Session: [facility] for tasks performed Cognition: Impaired, at baseline  Johnte Portnoy 07/31/2011, 5:02 PM Ciaira Natividad L. Chanise Habeck DPT [phone]

## 2011-08-01 LAB — CULTURE, BLOOD (ROUTINE X 2)
Culture  Setup Time: 201301301040
Culture  Setup Time: 201301301040

## 2011-08-01 LAB — GLUCOSE, CAPILLARY
Glucose-Capillary: 159 mg/dL — ABNORMAL HIGH (ref 70–99)
Glucose-Capillary: 185 mg/dL — ABNORMAL HIGH (ref 70–99)

## 2011-08-01 LAB — BASIC METABOLIC PANEL
BUN: 12 mg/dL (ref 6–23)
CO2: 21 mEq/L (ref 19–32)
Calcium: 8.5 mg/dL (ref 8.4–10.5)
Chloride: 100 mEq/L (ref 96–112)
Creatinine, Ser: 0.51 mg/dL (ref 0.50–1.10)
Glucose, Bld: 146 mg/dL — ABNORMAL HIGH (ref 70–99)

## 2011-08-01 LAB — CBC
HCT: 23.2 % — ABNORMAL LOW (ref 36.0–46.0)
Hemoglobin: 8 g/dL — ABNORMAL LOW (ref 12.0–15.0)
MCH: 27.1 pg (ref 26.0–34.0)
MCV: 78.6 fL (ref 78.0–100.0)
RBC: 2.95 MIL/uL — ABNORMAL LOW (ref 3.87–5.11)
WBC: 8.5 10*3/uL (ref 4.0–10.5)

## 2011-08-01 LAB — WOUND CULTURE

## 2011-08-01 LAB — MAGNESIUM: Magnesium: 1.7 mg/dL (ref 1.5–2.5)

## 2011-08-01 MED ORDER — WHITE PETROLATUM GEL
Status: AC
  Administered 2011-08-01: 11:00:00
  Filled 2011-08-01: qty 5

## 2011-08-01 MED ORDER — POTASSIUM CHLORIDE 10 MEQ/50ML IV SOLN
10.0000 meq | INTRAVENOUS | Status: AC
  Administered 2011-08-01 (×4): 10 meq via INTRAVENOUS
  Filled 2011-08-01 (×4): qty 50

## 2011-08-01 MED ORDER — INSULIN REGULAR HUMAN 100 UNIT/ML IJ SOLN
INTRAMUSCULAR | Status: AC
  Administered 2011-08-01: 18:00:00 via INTRAVENOUS
  Filled 2011-08-01: qty 2000

## 2011-08-01 MED ORDER — POTASSIUM CHLORIDE IN NACL 40-0.9 MEQ/L-% IV SOLN
INTRAVENOUS | Status: DC
  Administered 2011-08-01 – 2011-08-06 (×3): 20 mL/h via INTRAVENOUS
  Filled 2011-08-01 (×4): qty 1000

## 2011-08-01 NOTE — Progress Notes (Signed)
  Subjective: Feels better and says she is eating some. Says no diarrhea  Objective: Vital signs in last 24 hours: Temp:  [98.9 F (37.2 C)-99.8 F (37.7 C)] 99.8 F (37.7 C) (02/02 0624) Pulse Rate:  [69-105] 69  (02/02 0624) Resp:  [18-22] 22  (02/02 0624) BP: (126-173)/(44-103) 128/52 mmHg (02/02 0624) SpO2:  [97 %-98 %] 98 % (02/02 0624) Last BM Date: 07/31/11  Intake/Output from previous day: 02/01 0701 - 02/02 0700 In: 1775.7 [P.O.:474; I.V.:160.3; IV Piggyback:500; TPN:641.3] Out: -  Intake/Output this shift:    General appearance: alert and no distress GI: Soft and no tender wound healed  Lab Results:   Basename 08/01/11 0500 07/31/11 0615  WBC 8.5 8.6  HGB 8.0* 8.5*  HCT 23.2* 24.6*  PLT 211 197   BMET  Basename 08/01/11 0500 07/31/11 0615  NA 128* 129*  K 3.2* 3.1*  CL 100 100  CO2 21 19  GLUCOSE 146* 153*  BUN 12 13  CREATININE 0.51 0.48*  CALCIUM 8.5 8.2*   PT/INR No results found for this basename: LABPROT:2,INR:2 in the last 72 hours ABG No results found for this basename: PHART:2,PCO2:2,PO2:2,HCO3:2 in the last 72 hours  Studies/Results: No results found.  Anti-infectives: Anti-infectives     Start     Dose/Rate Route Frequency Ordered Stop   07/30/11 1400   metroNIDAZOLE (FLAGYL) tablet 500 mg        500 mg Oral 3 times per day 07/30/11 0829     07/30/11 1100   vancomycin (VANCOCIN) 750 mg in sodium chloride 0.9 % 150 mL IVPB        750 mg 150 mL/hr over 60 Minutes Intravenous Every 12 hours 07/30/11 1008     07/30/11 0930   ciprofloxacin (CIPRO) tablet 500 mg        500 mg Oral 2 times daily 07/30/11 0829     07/29/11 1130   ciprofloxacin (CIPRO) IVPB 400 mg  Status:  Discontinued        400 mg 200 mL/hr over 60 Minutes Intravenous Every 12 hours 07/29/11 1029 07/30/11 0829   07/28/11 2300   ceFEPIme (MAXIPIME) 1 g in dextrose 5 % 50 mL IVPB  Status:  Discontinued        1 g 100 mL/hr over 30 Minutes Intravenous Every 12  hours 07/28/11 2230 07/29/11 1029   07/28/11 2245   vancomycin (VANCOCIN) IVPB 1000 mg/200 mL premix        1,000 mg 200 mL/hr over 60 Minutes Intravenous  Once 07/28/11 2230 07/29/11 0211   07/28/11 2245   metroNIDAZOLE (FLAGYL) IVPB 500 mg  Status:  Discontinued        500 mg 100 mL/hr over 60 Minutes Intravenous Every 8 hours 07/28/11 2230 07/30/11 0829          Assessment/Plan: PIC infection, improved malnutrition   No change. management today  LOS: 4 days    Raman Featherston J 08/01/2011

## 2011-08-01 NOTE — Progress Notes (Signed)
PARENTERAL NUTRITION CONSULT NOTE - FOLLOW UP   Pharmacy Consult for TPN Indication: Short gut  Allergies  Allergen Reactions  . Other Swelling    Pinto beans cause swelling and hives  . Penicillins Swelling    Patient Measurements: Height: 5' 6.5" (168.9 cm) Weight: 162 lb (73.483 kg) IBW/kg (Calculated) : 60.45  Usual Weight: 73.5  Vital Signs: Temp: 99.8 F (37.7 C) (02/02 0624) Temp src: Oral (02/02 0624) BP: 128/52 mmHg (02/02 0624) Pulse Rate: 69  (02/02 0624) Intake/Output from previous day: 02/01 0701 - 02/02 0700 In: 1775.7 [P.O.:474; I.V.:160.3; IV Piggyback:500; TPN:641.3] Out: -  Intake/Output from this shift:    Labs:  Basename 08/01/11 0500 07/31/11 0615 07/30/11 0630  WBC 8.5 8.6 9.3  HGB 8.0* 8.5* 8.5*  HCT 23.2* 24.6* 25.4*  PLT 211 197 177  APTT -- -- --  INR -- -- --     Basename 08/01/11 0500 07/31/11 0615 07/30/11 0850 07/30/11 0630  NA 128* 129* -- 128*  K 3.2* 3.1* -- 3.0*  CL 100 100 -- 96  CO2 21 19 -- 21  GLUCOSE 146* 153* -- 180*  BUN 12 13 -- 11  CREATININE 0.51 0.48* -- 0.55  LABCREA -- -- -- --  CREAT24HRUR -- -- -- --  CALCIUM 8.5 8.2* -- 8.4  MG 1.7 1.6 -- 1.5  PHOS -- 3.9 -- 3.9  PROT -- -- -- 6.2  ALBUMIN -- -- -- 1.9*  AST -- -- -- 29  ALT -- -- -- 24  ALKPHOS -- -- -- 125*  BILITOT -- -- -- 0.3  BILIDIR -- -- -- --  IBILI -- -- -- --  PREALBUMIN -- -- -- 6.0*  TRIG -- -- 84 --  CHOLHDL -- -- -- --  CHOL -- -- 75 --   Estimated Creatinine Clearance: 61.1 ml/min (by C-G formula based on Cr of 0.51).    Basename 08/01/11 0810 08/01/11 0410 07/31/11 2344  GLUCAP 159* 149* 144*    Insulin Requirements in the past 24 hours:  18 units SSI  Current Nutrition:  PO: carb modified diet started 1/30: 474cc po recorded Clinimix E 5/15 at 80 ml/hr and lipids at 10 ml/hr MWF: provides weekly average of 1569 kcl and 96  protein per day.  Assessment: [name] admitted from [facility] where she is on chronic TPN  and takes POs only for pleasure.  History of erosive esophagitis with perf pyloric ulcer s/p lap 11/7. s/p gastrostomy, gastrectomy, choleycystectomy. Last d/c 07/06/11. 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 Endo: DM.CBg 142-182 requiring 18 units SSI Lytes: K remains low at 3.2 Renal: stable with Scr 0.51 Cards: HTN with max BP 173/103 with HR 69-105: Norvasc 5mg , benazepril 20mg , Clonidine 0.1 mg patch  Hepatobil: LFTs WNL, Trigl 84. Prealbumin down to 6 Neuro: Seroquel  ID: Tmax 99.8. Small wound/abscess on abdomen. Cultures negative. Patient has CNS bacteremia presumed due to PICC line infection on Vancomycin. Prophx: SQ heparin, po PPI  Nutritional Goals:  1500-1600 kCal, 90-105 grams of protein per day  Plan:  1.  Continue clinimix E5/15 at 80 ml/hr 2.  Increase insulin in tna to 45 units/bag 3.  Continue lipids, MVI, TE, MWF only due to national shortage 4. K runs IV x 4 and increase in IVF

## 2011-08-02 LAB — BASIC METABOLIC PANEL
BUN: 12 mg/dL (ref 6–23)
CO2: 21 mEq/L (ref 19–32)
Calcium: 8.6 mg/dL (ref 8.4–10.5)
Creatinine, Ser: 0.51 mg/dL (ref 0.50–1.10)
GFR calc non Af Amer: >90 mL/min (ref >90)
Glucose, Bld: 153 mg/dL — ABNORMAL HIGH (ref 70–99)
Sodium: 128 mEq/L — ABNORMAL LOW (ref 135–145)

## 2011-08-02 LAB — GLUCOSE, CAPILLARY
Glucose-Capillary: 161 mg/dL — ABNORMAL HIGH (ref 70–99)
Glucose-Capillary: 151 mg/dL — ABNORMAL HIGH (ref 70–99)
Glucose-Capillary: 141 mg/dL — ABNORMAL HIGH (ref 70–99)

## 2011-08-02 MED ORDER — INSULIN REGULAR HUMAN 100 UNIT/ML IJ SOLN
INTRAVENOUS | Status: AC
  Administered 2011-08-02: 18:00:00 via INTRAVENOUS
  Filled 2011-08-02: qty 2000

## 2011-08-02 MED ORDER — CIPROFLOXACIN IN D5W 400 MG/200ML IV SOLN
400.0000 mg | INTRAVENOUS | Status: DC
  Filled 2011-08-02: qty 200

## 2011-08-02 MED ORDER — VANCOMYCIN HCL 1000 MG IV SOLR
1250.0000 mg | Freq: Two times a day (BID) | INTRAVENOUS | Status: DC
  Administered 2011-08-02 – 2011-08-06 (×9): 1250 mg via INTRAVENOUS
  Filled 2011-08-02 (×10): qty 1250

## 2011-08-02 NOTE — Progress Notes (Signed)
<principal problem not specified>   Subjective: Fells a bit better. Eating some, but not much. No nausea no diarrhea  Objective: Vital signs in last 24 hours: Temp:  [99.5 F (37.5 C)-100.4 F (38 C)] 100.4 F (38 C) (02/03 0637) Pulse Rate:  [74-105] 105  (02/03 0637) Resp:  [20-24] 20  (02/03 0637) BP: (111-133)/(54-60) 133/54 mmHg (02/03 0637) SpO2:  [95 %-99 %] 95 % (02/03 0637) Last BM Date: 08/01/11  Intake/Output from previous day: 02/02 0701 - 02/03 0700 In: 1183 [P.O.:120; I.V.:143; IV Piggyback:600; TPN:320] Out: -  Intake/Output this shift:    General appearance: alert, fatigued and no distress Resp: clear to auscultation bilaterally GI: Abdomen soft, not tender. Small amount of wound drainage  Lab Results:  Results for orders placed during the hospital encounter of 07/28/11 (from the past 24 hour(s))  GLUCOSE, CAPILLARY     Status: Abnormal   Collection Time   08/01/11 12:22 PM      Component Value Range   Glucose-Capillary 185 (*) 70 - 99 (mg/dL)   Comment 1 Notify RN    GLUCOSE, CAPILLARY     Status: Abnormal   Collection Time   08/01/11  4:25 PM      Component Value Range   Glucose-Capillary 139 (*) 70 - 99 (mg/dL)   Comment 1 Notify RN    GLUCOSE, CAPILLARY     Status: Abnormal   Collection Time   08/01/11  7:57 PM      Component Value Range   Glucose-Capillary 157 (*) 70 - 99 (mg/dL)  GLUCOSE, CAPILLARY     Status: Abnormal   Collection Time   08/02/11 12:01 AM      Component Value Range   Glucose-Capillary 120 (*) 70 - 99 (mg/dL)  GLUCOSE, CAPILLARY     Status: Abnormal   Collection Time   08/02/11  4:06 AM      Component Value Range   Glucose-Capillary 144 (*) 70 - 99 (mg/dL)  BASIC METABOLIC PANEL     Status: Abnormal   Collection Time   08/02/11  5:25 AM      Component Value Range   Sodium 128 (*) 135 - 145 (mEq/L)   Potassium 3.9  3.5 - 5.1 (mEq/L)   Chloride 100  96 - 112 (mEq/L)   CO2 21  19 - 32 (mEq/L)   Glucose, Bld 153 (*) 70 - 99  (mg/dL)   BUN 12  6 - 23 (mg/dL)   Creatinine, Ser 1.61  0.50 - 1.10 (mg/dL)   Calcium 8.6  8.4 - 09.6 (mg/dL)   GFR calc non Af Amer >90  >90 (mL/min)   GFR calc Af Amer >90  >90 (mL/min)  GLUCOSE, CAPILLARY     Status: Abnormal   Collection Time   08/02/11  7:53 AM      Component Value Range   Glucose-Capillary 161 (*) 70 - 99 (mg/dL)   Comment 1 Notify RN          MEDS, Scheduled    . amLODipine  5 mg Oral Daily  . benazepril  20 mg Oral Daily  . ciprofloxacin  500 mg Oral BID  . cloNIDine  0.1 mg Transdermal Weekly  . darifenacin  15 mg Oral Daily  . Ensure Plus  1 Can Oral Daily  . feeding supplement  1 Container Oral TID WC  . heparin  5,000 Units Subcutaneous Q8H  . insulin aspart  0-15 Units Subcutaneous Q4H  . metoprolol tartrate  12.5 mg  Oral BID  . metroNIDAZOLE  500 mg Oral Q8H  . pantoprazole  40 mg Oral BID AC  . potassium chloride  10 mEq Intravenous Q1 Hr x 4  . QUEtiapine  25 mg Oral QHS  . vancomycin (VANCOCIN) 750 mg IVPB  750 mg Intravenous Q12H  . white petrolatum      . DISCONTD: ciprofloxacin  400 mg Intravenous 120 min pre-op     Assessment/Plan: Sepsis apparently secondary to PICC; improved Malnutrition, on TNA HypoNatremia Plan: Continue antibiotics, TNA etc. May need TNA adjustment for Na 128    LOS: 5 days    Quanika Solem J 08/02/2011

## 2011-08-02 NOTE — Progress Notes (Signed)
PARENTERAL NUTRITION CONSULT NOTE - FOLLOW UP   Pharmacy Consult for TPN Indication: Short gut  Allergies  Allergen Reactions  . Other Swelling    Pinto beans cause swelling and hives  . Penicillins Swelling    Patient Measurements: Height: 5' 6.5" (168.9 cm) Weight: 162 lb (73.483 kg) IBW/kg (Calculated) : 60.45  Usual Weight: 73.5  Vital Signs: Temp: 100.4 [name] (38 C) (02/03 0637) Temp src: Oral (02/03 0637) BP: 133/54 mmHg (02/03 0637) Pulse Rate: 105  (02/03 0637) Intake/Output from previous day: 02/02 0701 - 02/03 0700 In: 1183 [P.O.:120; I.V.:143; IV Piggyback:600; TPN:320] Out: -  Intake/Output from this shift:    Labs:  Basename 08/01/11 0500 07/31/11 0615  WBC 8.5 8.6  HGB 8.0* 8.5*  HCT 23.2* 24.6*  PLT 211 197  APTT -- --  INR -- --     Basename 08/02/11 0525 08/01/11 0500 07/31/11 0615  NA 128* 128* 129*  K 3.9 3.2* 3.1*  CL 100 100 100  CO2 21 21 19   GLUCOSE 153* 146* 153*  BUN 12 12 13   CREATININE 0.51 0.51 0.48*  LABCREA -- -- --  CREAT24HRUR -- -- --  CALCIUM 8.6 8.5 8.2*  MG -- 1.7 1.6  PHOS -- -- 3.9  PROT -- -- --  ALBUMIN -- -- --  AST -- -- --  ALT -- -- --  ALKPHOS -- -- --  BILITOT -- -- --  BILIDIR -- -- --  IBILI -- -- --  PREALBUMIN -- -- --  TRIG -- -- --  CHOLHDL -- -- --  CHOL -- -- --   Estimated Creatinine Clearance: 61.1 ml/min (by C-G formula based on Cr of 0.51).    Basename 08/02/11 0753 08/02/11 0406 08/02/11 0001  GLUCAP 161* 144* 120*    Insulin Requirements in the past 24 hours:  15 units SSI  Current Nutrition:  PO: carb modified diet started 1/30: 120  po recorded Clinimix E 5/15 at 80 ml/hr and lipids at 10 ml/hr MWF: provides weekly average of 1569 kcl and 96  protein per day.  Assessment: 78 yo [name] admitted from [facility] where she is on chronic TPN and takes POs only for pleasure.  History of erosive esophagitis with perf pyloric ulcer s/p lap 11/7. s/p gastrostomy, gastrectomy,  choleycystectomy. Last d/c 07/06/11. 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 Endo: DM.CBg 120-185 requiring 15 units SSI Lytes: Na low. K replaced to 3.9 Renal: stable with Scr 0.51 Cards: HTN on Norvasc 5mg , benazepril 20mg , Clonidine 0.1 mg patch  Hepatobil: LFTs WNL, Trigl 84. Prealbumin down to 6. Next Monday. Neuro: Seroquel  ID: Tmax 100.4. Small wound/abscess on abdomen. Cultures negative. Patient has CNS bacteremia presumed due to PICC line infection on Vancomycin. Prophx: SQ heparin, po PPI  Nutritional Goals:  1500-1600 kCal, 90-105 grams of protein per day  Plan:  1.  Continue clinimix E5/15 at 80 ml/hr 2.  Increase insulin in tna to 55 units/bag 3.  Continue lipids, MVI, TE, MWF only due to national shortage 4. K runs IV x 4 and increase in IVF

## 2011-08-02 NOTE — Progress Notes (Signed)
Pinpoint area of oozing noted approximately 2 cm distal to midline area of dressing change.

## 2011-08-02 NOTE — Progress Notes (Signed)
ANTIBIOTIC CONSULT NOTE - FOLLOW UP  Pharmacy Consult for Vancomycin Indication: PICC related bacteremia  Allergies  Allergen Reactions  . Other Swelling    Pinto beans cause swelling and hives  . Penicillins Swelling    Patient Measurements: Height: 5' 6.5" (168.9 cm) Weight: 162 lb (73.483 kg) IBW/kg (Calculated) : 60.45  Adjusted Body Weight:   Vital Signs: Temp: 100.4 F (38 C) (02/03 0637) Temp src: Oral (02/03 0637) BP: 133/54 mmHg (02/03 0637) Pulse Rate: 105  (02/03 0637) Intake/Output from previous day: 02/02 0701 - 02/03 0700 In: 1183 [P.O.:120; I.V.:143; IV Piggyback:600; TPN:320] Out: -  Intake/Output from this shift:    Labs:  Basename 08/02/11 0525 08/01/11 0500 07/31/11 0615  WBC -- 8.5 8.6  HGB -- 8.0* 8.5*  PLT -- 211 197  LABCREA -- -- --  CREATININE 0.51 0.51 0.48*   Estimated Creatinine Clearance: 61.1 ml/min (by C-G formula based on Cr of 0.51).  Basename 08/02/11 0940  VANCOTROUGH 8.6*  VANCOPEAK --  Drue Dun --  GENTTROUGH --  GENTPEAK --  GENTRANDOM --  TOBRATROUGH --  TOBRAPEAK --  TOBRARND --  AMIKACINPEAK --  AMIKACINTROU --  AMIKACIN --     Microbiology: Recent Results (from the past 720 hour(s))  CULTURE, BLOOD (ROUTINE X 2)     Status: Normal   Collection Time   07/28/11 11:30 PM      Component Value Range Status Comment   Specimen Description BLOOD RIGHT HAND   Final    Special Requests BOTTLES DRAWN AEROBIC ONLY 4CC   Final    Culture  Setup Time [id number]   Final    Culture     Final    Value: STAPHYLOCOCCUS SPECIES (COAGULASE NEGATIVE)     Note: SUSCEPTIBILITIES PERFORMED ON PREVIOUS CULTURE WITHIN THE LAST 5 DAYS.     Note: Gram Stain Report Called to,Read Back By and Verified With: PATTY MOSS 07/30/11 0900 BY SMITHERSJ   Report Status 08/01/2011 FINAL   Final   CULTURE, BLOOD (ROUTINE X 2)     Status: Normal   Collection Time   07/28/11 11:42 PM      Component Value Range Status Comment   Specimen  Description BLOOD HAND LEFT   Final    Special Requests BOTTLES DRAWN AEROBIC ONLY 5CC   Final    Culture  Setup Time [id number]   Final    Culture     Final    Value: STAPHYLOCOCCUS SPECIES (COAGULASE NEGATIVE)     Note: RIFAMPIN AND GENTAMICIN SHOULD NOT BE USED AS SINGLE DRUGS FOR TREATMENT OF STAPH INFECTIONS.     Note: Gram Stain Report Called to,Read Back By and Verified With: PATTI MOSS [id number] 07/30/2011 BY WOODB   Report Status 08/01/2011 FINAL   Final    Organism ID, Bacteria STAPHYLOCOCCUS SPECIES (COAGULASE NEGATIVE)   Final   WOUND CULTURE     Status: Normal   Collection Time   07/29/11  1:08 PM      Component Value Range Status Comment   Specimen Description WOUND ABDOMEN   Final    Special Requests NONE   Final    Gram Stain     Final    Value: RARE WBC PRESENT, PREDOMINANTLY PMN     NO SQUAMOUS EPITHELIAL CELLS SEEN     NO ORGANISMS SEEN   Culture NO GROWTH 3 DAYS   Final    Report Status 08/01/2011 FINAL   Final   MRSA PCR SCREENING  Status: Normal   Collection Time   07/29/11  8:56 PM      Component Value Range Status Comment   MRSA by PCR NEGATIVE  NEGATIVE  Final   CULTURE, BLOOD (ROUTINE X 2)     Status: Normal (Preliminary result)   Collection Time   07/31/11  5:31 PM      Component Value Range Status Comment   Specimen Description BLOOD RIGHT ARM   Final    Special Requests BOTTLES DRAWN AEROBIC ONLY 4.5CC   Final    Culture  Setup Time [id number]   Final    Culture     Final    Value:        BLOOD CULTURE RECEIVED NO GROWTH TO DATE CULTURE WILL BE HELD FOR 5 DAYS BEFORE ISSUING A FINAL NEGATIVE REPORT   Report Status PENDING   Incomplete     Anti-infectives     Start     Dose/Rate Route Frequency Ordered Stop   08/02/11 0345   ciprofloxacin (CIPRO) IVPB 400 mg  Status:  Discontinued        400 mg 200 mL/hr over 60 Minutes Intravenous 120 min pre-op 08/02/11 0340 08/02/11 0340   07/30/11 1400   metroNIDAZOLE (FLAGYL) tablet 500 mg        500 mg Oral  3 times per day 07/30/11 0829     07/30/11 1100   vancomycin (VANCOCIN) 750 mg in sodium chloride 0.9 % 150 mL IVPB        750 mg 150 mL/hr over 60 Minutes Intravenous Every 12 hours 07/30/11 1008     07/30/11 0930   ciprofloxacin (CIPRO) tablet 500 mg        500 mg Oral 2 times daily 07/30/11 0829     07/29/11 1130   ciprofloxacin (CIPRO) IVPB 400 mg  Status:  Discontinued        400 mg 200 mL/hr over 60 Minutes Intravenous Every 12 hours 07/29/11 1029 07/30/11 0829   07/28/11 2300   ceFEPIme (MAXIPIME) 1 g in dextrose 5 % 50 mL IVPB  Status:  Discontinued        1 g 100 mL/hr over 30 Minutes Intravenous Every 12 hours 07/28/11 2230 07/29/11 1029   07/28/11 2245   vancomycin (VANCOCIN) IVPB 1000 mg/200 mL premix        1,000 mg 200 mL/hr over 60 Minutes Intravenous  Once 07/28/11 2230 07/29/11 0211   07/28/11 2245   metroNIDAZOLE (FLAGYL) IVPB 500 mg  Status:  Discontinued        500 mg 100 mL/hr over 60 Minutes Intravenous Every 8 hours 07/28/11 2230 07/30/11 0829          Assessment: 79yof on Vancomycin Day 4 for Coag Negative Staph (CNS) bacteremia/sepsis. 2/2 blood cultures drawn on 1/29 grew CNS only sensitive to Vancomycin and Rifampin. Repeat blood cultures drawn 2/1 are NGTD. Pt's SCr remains stable (SCr 0.51, CrCl 61) and UOP is good. Vancomycin trough was drawn appropriately this AM and reported as 8.6 - subtherapeutic. With the calculated kinetics (ke 0.093, t1/2 7.5hrs), will keep the interval at 12hrs, increase the dose, and start the new regimen early. ID has been consulted and is advising on LOT.   Goal of Therapy:  Vancomycin trough level 15-20 mcg/ml  Plan:  1. Change Vancomycin to 1250mg  IV q12h - next dose @ 1400 today 2. Follow-up renal function, UOP, ID recommendations and order follow-up trough @ steady state   Cleon Dew [phone]  08/02/2011,11:46 AM

## 2011-08-03 ENCOUNTER — Encounter (HOSPITAL_COMMUNITY): Payer: Self-pay | Admitting: *Deleted

## 2011-08-03 DIAGNOSIS — K651 Peritoneal abscess: Secondary | ICD-10-CM

## 2011-08-03 LAB — CBC
HCT: 24.6 % — ABNORMAL LOW (ref 36.0–46.0)
Hemoglobin: 8.3 g/dL — ABNORMAL LOW (ref 12.0–15.0)
MCHC: 33.7 g/dL (ref 30.0–36.0)
RDW: 16.5 % — ABNORMAL HIGH (ref 11.5–15.5)
WBC: 8.1 10*3/uL (ref 4.0–10.5)

## 2011-08-03 LAB — GLUCOSE, CAPILLARY
Glucose-Capillary: 134 mg/dL — ABNORMAL HIGH (ref 70–99)
Glucose-Capillary: 142 mg/dL — ABNORMAL HIGH (ref 70–99)
Glucose-Capillary: 166 mg/dL — ABNORMAL HIGH (ref 70–99)

## 2011-08-03 LAB — COMPREHENSIVE METABOLIC PANEL
AST: 9 U/L (ref 0–37)
Albumin: 1.8 g/dL — ABNORMAL LOW (ref 3.5–5.2)
BUN: 13 mg/dL (ref 6–23)
Calcium: 8.6 mg/dL (ref 8.4–10.5)
Chloride: 102 mEq/L (ref 96–112)
Creatinine, Ser: 0.43 mg/dL — ABNORMAL LOW (ref 0.50–1.10)
Total Bilirubin: 0.2 mg/dL — ABNORMAL LOW (ref 0.3–1.2)

## 2011-08-03 LAB — DIFFERENTIAL
Basophils Absolute: 0 10*3/uL (ref 0.0–0.1)
Basophils Relative: 0 % (ref 0–1)
Lymphocytes Relative: 22 % (ref 12–46)
Neutro Abs: 5.4 10*3/uL (ref 1.7–7.7)
Neutrophils Relative %: 67 % (ref 43–77)

## 2011-08-03 LAB — PHOSPHORUS: Phosphorus: 4.1 mg/dL (ref 2.3–4.6)

## 2011-08-03 LAB — MAGNESIUM: Magnesium: 1.6 mg/dL (ref 1.5–2.5)

## 2011-08-03 LAB — TRIGLYCERIDES: Triglycerides: 75 mg/dL (ref <150)

## 2011-08-03 MED ORDER — TRACE MINERALS CR-CU-MN-SE-ZN 10-1000-500-60 MCG/ML IV SOLN
INTRAVENOUS | Status: AC
  Administered 2011-08-03: 18:00:00 via INTRAVENOUS
  Filled 2011-08-03: qty 2000

## 2011-08-03 MED ORDER — MAGNESIUM SULFATE 40 MG/ML IJ SOLN
2.0000 g | Freq: Once | INTRAMUSCULAR | Status: AC
  Administered 2011-08-03: 2 g via INTRAVENOUS
  Filled 2011-08-03: qty 50

## 2011-08-03 MED ORDER — FAT EMULSION 20 % IV EMUL
240.0000 mL | INTRAVENOUS | Status: AC
  Administered 2011-08-03: 240 mL via INTRAVENOUS
  Filled 2011-08-03: qty 250

## 2011-08-03 MED ORDER — WHITE PETROLATUM GEL
Status: AC
  Administered 2011-08-03: 20:00:00
  Filled 2011-08-03: qty 5

## 2011-08-03 NOTE — Progress Notes (Signed)
INFECTIOUS DISEASE PROGRESS NOTE  ID: [name] is a 78 y.o. female with  Active Problems:  * No active hospital problems. *   Subjective: Without complaints  Abtx:  Anti-infectives     Start     Dose/Rate Route Frequency Ordered Stop   08/02/11 1400   vancomycin (VANCOCIN) 1,250 mg in sodium chloride 0.9 % 250 mL IVPB        1,250 mg 166.7 mL/hr over 90 Minutes Intravenous Every 12 hours 08/02/11 1203     08/02/11 0345   ciprofloxacin (CIPRO) IVPB 400 mg  Status:  Discontinued        400 mg 200 mL/hr over 60 Minutes Intravenous 120 min pre-op 08/02/11 0340 08/02/11 0340   07/30/11 1400   metroNIDAZOLE (FLAGYL) tablet 500 mg        500 mg Oral 3 times per day 07/30/11 0829     07/30/11 1100   vancomycin (VANCOCIN) 750 mg in sodium chloride 0.9 % 150 mL IVPB  Status:  Discontinued        750 mg 150 mL/hr over 60 Minutes Intravenous Every 12 hours 07/30/11 1008 08/02/11 1155   07/30/11 0930   ciprofloxacin (CIPRO) tablet 500 mg        500 mg Oral 2 times daily 07/30/11 0829     07/29/11 1130   ciprofloxacin (CIPRO) IVPB 400 mg  Status:  Discontinued        400 mg 200 mL/hr over 60 Minutes Intravenous Every 12 hours 07/29/11 1029 07/30/11 0829   07/28/11 2300   ceFEPIme (MAXIPIME) 1 g in dextrose 5 % 50 mL IVPB  Status:  Discontinued        1 g 100 mL/hr over 30 Minutes Intravenous Every 12 hours 07/28/11 2230 07/29/11 1029   07/28/11 2245   vancomycin (VANCOCIN) IVPB 1000 mg/200 mL premix        1,000 mg 200 mL/hr over 60 Minutes Intravenous  Once 07/28/11 2230 07/29/11 0211   07/28/11 2245   metroNIDAZOLE (FLAGYL) IVPB 500 mg  Status:  Discontinued        500 mg 100 mL/hr over 60 Minutes Intravenous Every 8 hours 07/28/11 2230 07/30/11 0829          Medications: I have reviewed the patient's current medications.  Objective: Vital signs in last 24 hours: Temp:  [98.4 F (36.9 C)-101.3 F (38.5 C)] 100.6 F (38.1 C) (02/04 1445) Pulse Rate:  [101-105] 105   (02/04 1445) Resp:  [20] 20  (02/04 1445) BP: (128-142)/(57-64) 128/64 mmHg (02/04 1445) SpO2:  [94 %-99 %] 94 % (02/04 1445)   General appearance: alert and no distress Resp: clear to auscultation bilaterally Cardio: regular rate and rhythm and S1, S2 normal GI: normal findings: bowel sounds normal and soft, non-tender and abnormal findings:  wounds dressed.   Lab Results  Basename 08/03/11 0550 08/02/11 0525 08/01/11 0500  WBC 8.1 -- 8.5  HGB 8.3* -- 8.0*  HCT 24.6* -- 23.2*  NA 131* 128* --  K 3.8 3.9 --  CL 102 100 --  CO2 21 21 --  BUN 13 12 --  CREATININE 0.43* 0.51 --  GLU -- -- --   Liver Panel  Basename 08/03/11 0550  PROT 6.2  ALBUMIN 1.8*  AST 9  ALT 8  ALKPHOS 88  BILITOT 0.2*  BILIDIR --  IBILI --   Sedimentation Rate No results found for this basename: ESRSEDRATE in the last 72 hours C-Reactive Protein No results found  for this basename: CRP:2 in the last 72 hours  Microbiology: Recent Results (from the past 240 hour(s))  CULTURE, BLOOD (ROUTINE X 2)     Status: Normal   Collection Time   07/28/11 11:30 PM      Component Value Range Status Comment   Specimen Description BLOOD RIGHT HAND   Final    Special Requests BOTTLES DRAWN AEROBIC ONLY 4CC   Final    Culture  Setup Time [id number]   Final    Culture     Final    Value: STAPHYLOCOCCUS SPECIES (COAGULASE NEGATIVE)     Note: SUSCEPTIBILITIES PERFORMED ON PREVIOUS CULTURE WITHIN THE LAST 5 DAYS.     Note: Gram Stain Report Called to,Read Back By and Verified With: PATTY MOSS 07/30/11 0900 BY SMITHERSJ   Report Status 08/01/2011 FINAL   Final   CULTURE, BLOOD (ROUTINE X 2)     Status: Normal   Collection Time   07/28/11 11:42 PM      Component Value Range Status Comment   Specimen Description BLOOD HAND LEFT   Final    Special Requests BOTTLES DRAWN AEROBIC ONLY 5CC   Final    Culture  Setup Time [id number]   Final    Culture     Final    Value: STAPHYLOCOCCUS SPECIES (COAGULASE NEGATIVE)      Note: RIFAMPIN AND GENTAMICIN SHOULD NOT BE USED AS SINGLE DRUGS FOR TREATMENT OF STAPH INFECTIONS.     Note: Gram Stain Report Called to,Read Back By and Verified With: PATTI MOSS 0740 07/30/2011 BY WOODB   Report Status 08/01/2011 FINAL   Final    Organism ID, Bacteria STAPHYLOCOCCUS SPECIES (COAGULASE NEGATIVE)   Final   WOUND CULTURE     Status: Normal   Collection Time   07/29/11  1:08 PM      Component Value Range Status Comment   Specimen Description WOUND ABDOMEN   Final    Special Requests NONE   Final    Gram Stain     Final    Value: RARE WBC PRESENT, PREDOMINANTLY PMN     NO SQUAMOUS EPITHELIAL CELLS SEEN     NO ORGANISMS SEEN   Culture NO GROWTH 3 DAYS   Final    Report Status 08/01/2011 FINAL   Final   MRSA PCR SCREENING     Status: Normal   Collection Time   07/29/11  8:56 PM      Component Value Range Status Comment   MRSA by PCR NEGATIVE  NEGATIVE  Final   CULTURE, BLOOD (ROUTINE X 2)     Status: Normal (Preliminary result)   Collection Time   07/31/11  5:31 PM      Component Value Range Status Comment   Specimen Description BLOOD RIGHT ARM   Final    Special Requests BOTTLES DRAWN AEROBIC ONLY 4.5CC   Final    Culture  Setup Time [id number]   Final    Culture     Final    Value: GRAM POSITIVE COCCI IN CLUSTERS     Note: Gram Stain Report Called to,Read Back By and Verified With: Meade Maw RN on 08/03/11 at 05:20 by Christie Nottingham   Report Status PENDING   Incomplete     Studies/Results: No results found.   Assessment/Plan: Abdominal Abscess MRSE Bacteremia due to [facility] Day 7 of anbx Would continue vanco. With repeat BCx + on 2-1, she needs TEE to determine length of Rx.  No change in flagyl,  cipro.  Repeat BCx  [name] Infectious Diseases [phone] 08/03/2011, 4:00 PM

## 2011-08-03 NOTE — Progress Notes (Signed)
Report called from lab that blood cultures drawn 2-1 were positive for gram positive cocci in clusters in the aerobic bottle.

## 2011-08-03 NOTE — Progress Notes (Signed)
Some purulent drainage from upper midline wound. Await blood cultures. Patient examined and I agree with the assessment and plan  [name], MD, MPH, FACS Pager: [phone]  08/03/2011 10:22 AM

## 2011-08-03 NOTE — Progress Notes (Signed)
Clinical Social Worker met with pt at bedside.  CSW offered support and confirmed plan that pt is to return to SNF at dc.  Pt stated she was tired.  CSW to continue to follow and assist as needed.  Angelia Mould, MSW, [place] [phone]

## 2011-08-03 NOTE — Progress Notes (Signed)
PARENTERAL NUTRITION CONSULT NOTE - FOLLOW UP   Pharmacy Consult for TPN Indication: Short gut  Allergies  Allergen Reactions  . Other Swelling    Pinto beans cause swelling and hives  . Penicillins Swelling    Patient Measurements: Height: 5' 6.5" (168.9 cm) Weight: 162 lb (73.483 kg) IBW/kg (Calculated) : 60.45  Usual Weight: 73.5  Vital Signs: Temp: 98.4 F (36.9 C) (02/04 0529) Temp src: Oral (02/04 0529) BP: 134/57 mmHg (02/04 0529) Pulse Rate: 101  (02/04 0529) Intake/Output from previous day: 02/03 0701 - 02/04 0700 In: 4088 [P.O.:240; I.V.:1341; TPN:2507] Out: -  Intake/Output from this shift: Total I/O In: 120 [P.O.:120] Out: -   Labs:  Basename 08/03/11 0550 08/01/11 0500  WBC 8.1 8.5  HGB 8.3* 8.0*  HCT 24.6* 23.2*  PLT 295 211  APTT -- --  INR -- --     Basename 08/03/11 0550 08/02/11 0525 08/01/11 0500  NA 131* 128* 128*  K 3.8 3.9 3.2*  CL 102 100 100  CO2 21 21 21   GLUCOSE 129* 153* 146*  BUN 13 12 12   CREATININE 0.43* 0.51 0.51  LABCREA -- -- --  CREAT24HRUR -- -- --  CALCIUM 8.6 8.6 8.5  MG 1.6 -- 1.7  PHOS 4.1 -- --  PROT 6.2 -- --  ALBUMIN 1.8* -- --  AST 9 -- --  ALT 8 -- --  ALKPHOS 88 -- --  BILITOT 0.2* -- --  BILIDIR -- -- --  IBILI -- -- --  PREALBUMIN -- -- --  TRIG 75 -- --  CHOLHDL -- -- --  CHOL 55 -- --   Estimated Creatinine Clearance: 61.1 ml/min (by C-G formula based on Cr of 0.43).    Basename 08/03/11 0819 08/03/11 0356 08/03/11 0012  GLUCAP 134* 142* 166*    Insulin Requirements in the past 24 hours:  9 units SSI  Current Nutrition:  PO: carb modified diet started 1/30. Clinimix E 5/15 at 80 ml/hr and lipids at 10 ml/hr MWF: provides weekly average of 1569 kcl and 96  protein per day.  Assessment: [name] admitted from [facility] where she is on chronic TPN and takes POs only for pleasure.  History of erosive esophagitis with perf pyloric ulcer s/p lap 11/7. s/p gastrostomy, gastrectomy,  choleycystectomy. Last d/c 07/06/11. 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 Endo: DM.CBG OK, only 1 > goal of 150 mg/dl. 55 units insulin in TPN.  Lytes: Na low - but improved, Mg at low end Nl.  Renal: stable with Scr 0.51 Cards: HTN on Norvasc 5mg , benazepril 20mg , Clonidine 0.1 mg patch  Hepatobil: LFTs WNL, Trigl 75. Prealbumin down to 6. Next Monday. Neuro: Seroquel  ID: Tmax 100.4. Small wound/abscess on abdomen. Cultures negative. Patient has CNS bacteremia presumed due to PICC line infection on Vancomycin. Prophx: SQ heparin, po PPI  Nutritional Goals:  1500-1600 kCal, 90-105 grams of protein per day  Plan:  1.  Continue clinimix E5/15 at 80 ml/hr 2.  Continue lipids, MVI, TE, MWF only due to national shortage 3. Mag sulfate 2gm IV x 1.   Juliette Alcide, PharmD, BCPS.  Pager: [phone] 08/03/2011 12:08 PM

## 2011-08-03 NOTE — Progress Notes (Signed)
  Subjective: Pt feels ok. Minimal pain Appetite ok, ate some breakfast this am. Denies N/V  Objective: Vital signs in last 24 hours: Temp:  [98.4 F (36.9 C)-101.3 F (38.5 C)] 98.4 F (36.9 C) (02/04 0529) Pulse Rate:  [88-102] 101  (02/04 0529) Resp:  [18-20] 20  (02/04 0529) BP: (124-142)/(54-57) 134/57 mmHg (02/04 0529) SpO2:  [95 %-99 %] 99 % (02/04 0529) Last BM Date: 08/01/11  Intake/Output this shift:    Physical Exam: BP 134/57  Pulse 101  Temp(Src) 98.4 F (36.9 C) (Oral)  Resp 20  Ht 5' 6.5" (1.689 m)  Wt 73.483 kg (162 lb)  BMI 25.76 kg/m2  SpO2 99% Abdomen: small upper wound clean, smaller pustule inferior to main wound will likely need to be opened and drained. G-tube intact.  Labs: CBC  Basename 08/03/11 0550 08/01/11 0500  WBC 8.1 8.5  HGB 8.3* 8.0*  HCT 24.6* 23.2*  PLT 295 211   BMET  Basename 08/03/11 0550 08/02/11 0525  NA 131* 128*  K 3.8 3.9  CL 102 100  CO2 21 21  GLUCOSE 129* 153*  BUN 13 12  CREATININE 0.43* 0.51  CALCIUM 8.6 8.6   LFT  Basename 08/03/11 0550  PROT 6.2  ALBUMIN 1.8*  AST 9  ALT 8  ALKPHOS 88  BILITOT 0.2*  BILIDIR --  IBILI --  LIPASE --   PT/INR No results found for this basename: LABPROT:2,INR:2 in the last 72 hours ABG No results found for this basename: PHART:2,PCO2:2,PO2:2,HCO3:2 in the last 72 hours  Studies/Results: Blood culture 07/28/11--Coag negative Staph Blood culture 07/31/11----gram pos cocci, pending.  Assessment: 1. Bacteremia, presumed from line sepsis from previous PICC  2. Small wound on abdominal wall with adjacent pustule-will drain. 3.Hypokalemia  4.PCM/TNA 5. Hyponatremia improved.  Plan: Continue IV Vanc Wound care Cultures pending to determine abx course, appreciate ID recs/input.   LOS: 6 days    Alyse Low 08/03/2011 8:52 AM

## 2011-08-04 LAB — GLUCOSE, CAPILLARY
Glucose-Capillary: 136 mg/dL — ABNORMAL HIGH (ref 70–99)
Glucose-Capillary: 163 mg/dL — ABNORMAL HIGH (ref 70–99)

## 2011-08-04 LAB — BASIC METABOLIC PANEL
Calcium: 8.6 mg/dL (ref 8.4–10.5)
GFR calc Af Amer: >90 mL/min (ref >90)
GFR calc non Af Amer: >90 mL/min (ref >90)
Glucose, Bld: 135 mg/dL — ABNORMAL HIGH (ref 70–99)
Potassium: 3.8 mEq/L (ref 3.5–5.1)
Sodium: 130 mEq/L — ABNORMAL LOW (ref 135–145)

## 2011-08-04 LAB — CULTURE, BLOOD (ROUTINE X 2): Culture  Setup Time: 201302020142

## 2011-08-04 MED ORDER — METRONIDAZOLE IN NACL 5-0.79 MG/ML-% IV SOLN
500.0000 mg | INTRAVENOUS | Status: DC
  Filled 2011-08-04: qty 100

## 2011-08-04 MED ORDER — INSULIN REGULAR HUMAN 100 UNIT/ML IJ SOLN
INTRAMUSCULAR | Status: AC
  Administered 2011-08-04: 17:00:00 via INTRAVENOUS
  Filled 2011-08-04: qty 2000

## 2011-08-04 NOTE — Progress Notes (Signed)
INFECTIOUS DISEASE PROGRESS NOTE  ID: [name] is a 78 y.o. female with  Active Problems:  * No active hospital problems. *   Subjective: Without complaints  Abtx:  Anti-infectives     Start     Dose/Rate Route Frequency Ordered Stop   08/02/11 1400   vancomycin (VANCOCIN) 1,250 mg in sodium chloride 0.9 % 250 mL IVPB        1,250 mg 166.7 mL/hr over 90 Minutes Intravenous Every 12 hours 08/02/11 1203     08/02/11 0345   ciprofloxacin (CIPRO) IVPB 400 mg  Status:  Discontinued        400 mg 200 mL/hr over 60 Minutes Intravenous 120 min pre-op 08/02/11 0340 08/02/11 0340   07/30/11 1400   metroNIDAZOLE (FLAGYL) tablet 500 mg        500 mg Oral 3 times per day 07/30/11 0829     07/30/11 1100   vancomycin (VANCOCIN) 750 mg in sodium chloride 0.9 % 150 mL IVPB  Status:  Discontinued        750 mg 150 mL/hr over 60 Minutes Intravenous Every 12 hours 07/30/11 1008 08/02/11 1155   07/30/11 0930   ciprofloxacin (CIPRO) tablet 500 mg        500 mg Oral 2 times daily 07/30/11 0829     07/29/11 1130   ciprofloxacin (CIPRO) IVPB 400 mg  Status:  Discontinued        400 mg 200 mL/hr over 60 Minutes Intravenous Every 12 hours 07/29/11 1029 07/30/11 0829   07/28/11 2300   ceFEPIme (MAXIPIME) 1 g in dextrose 5 % 50 mL IVPB  Status:  Discontinued        1 g 100 mL/hr over 30 Minutes Intravenous Every 12 hours 07/28/11 2230 07/29/11 1029   07/28/11 2245   vancomycin (VANCOCIN) IVPB 1000 mg/200 mL premix        1,000 mg 200 mL/hr over 60 Minutes Intravenous  Once 07/28/11 2230 07/29/11 0211   07/28/11 2245   metroNIDAZOLE (FLAGYL) IVPB 500 mg  Status:  Discontinued        500 mg 100 mL/hr over 60 Minutes Intravenous Every 8 hours 07/28/11 2230 07/30/11 0829          Medications:  Scheduled:   . amLODipine  5 mg Oral Daily  . benazepril  20 mg Oral Daily  . ciprofloxacin  500 mg Oral BID  . cloNIDine  0.1 mg Transdermal Weekly  . darifenacin  15 mg Oral Daily  . Ensure Plus   1 Can Oral Daily  . feeding supplement  1 Container Oral TID WC  . heparin  5,000 Units Subcutaneous Q8H  . insulin aspart  0-15 Units Subcutaneous Q4H  . magnesium sulfate 1 - 4 g bolus IVPB  2 g Intravenous Once  . metoprolol tartrate  12.5 mg Oral BID  . metroNIDAZOLE  500 mg Oral Q8H  . pantoprazole  40 mg Oral BID AC  . QUEtiapine  25 mg Oral QHS  . vancomycin  1,250 mg Intravenous Q12H  . white petrolatum        Objective: Vital signs in last 24 hours: Temp:  [99.1 F (37.3 C)-100.6 F (38.1 C)] 99.1 F (37.3 C) (02/05 0550) Pulse Rate:  [104-108] 104  (02/05 0550) Resp:  [18-20] 18  (02/05 0550) BP: (128-139)/(61-64) 139/62 mmHg (02/05 0550) SpO2:  [94 %-95 %] 95 % (02/05 0550)   General appearance: alert, cooperative and no distress Resp: clear to auscultation  bilaterally Cardio: regular rate and rhythm GI: normal findings: bowel sounds normal and soft, non-tender Extremities: LUE PIC non-tender  Lab Results  Basename 08/04/11 0533 08/03/11 0550  WBC -- 8.1  HGB -- 8.3*  HCT -- 24.6*  NA 130* 131*  K 3.8 3.8  CL 101 102  CO2 22 21  BUN 15 13  CREATININE 0.49* 0.43*  GLU -- --   Liver Panel  Basename 08/03/11 0550  PROT 6.2  ALBUMIN 1.8*  AST 9  ALT 8  ALKPHOS 88  BILITOT 0.2*  BILIDIR --  IBILI --   Sedimentation Rate No results found for this basename: ESRSEDRATE in the last 72 hours C-Reactive Protein No results found for this basename: CRP:2 in the last 72 hours  Microbiology: Recent Results (from the past 240 hour(s))  CULTURE, BLOOD (ROUTINE X 2)     Status: Normal   Collection Time   07/28/11 11:30 PM      Component Value Range Status Comment   Specimen Description BLOOD RIGHT HAND   Final    Special Requests BOTTLES DRAWN AEROBIC ONLY 4CC   Final    Culture  Setup Time [id number]   Final    Culture     Final    Value: STAPHYLOCOCCUS SPECIES (COAGULASE NEGATIVE)     Note: SUSCEPTIBILITIES PERFORMED ON PREVIOUS CULTURE WITHIN THE  LAST 5 DAYS.     Note: Gram Stain Report Called to,Read Back By and Verified With: PATTY MOSS 07/30/11 0900 BY SMITHERSJ   Report Status 08/01/2011 FINAL   Final   CULTURE, BLOOD (ROUTINE X 2)     Status: Normal   Collection Time   07/28/11 11:42 PM      Component Value Range Status Comment   Specimen Description BLOOD HAND LEFT   Final    Special Requests BOTTLES DRAWN AEROBIC ONLY 5CC   Final    Culture  Setup Time [id number]   Final    Culture     Final    Value: STAPHYLOCOCCUS SPECIES (COAGULASE NEGATIVE)     Note: RIFAMPIN AND GENTAMICIN SHOULD NOT BE USED AS SINGLE DRUGS FOR TREATMENT OF STAPH INFECTIONS.     Note: Gram Stain Report Called to,Read Back By and Verified With: PATTI MOSS 0740 07/30/2011 BY WOODB   Report Status 08/01/2011 FINAL   Final    Organism ID, Bacteria STAPHYLOCOCCUS SPECIES (COAGULASE NEGATIVE)   Final   WOUND CULTURE     Status: Normal   Collection Time   07/29/11  1:08 PM      Component Value Range Status Comment   Specimen Description WOUND ABDOMEN   Final    Special Requests NONE   Final    Gram Stain     Final    Value: RARE WBC PRESENT, PREDOMINANTLY PMN     NO SQUAMOUS EPITHELIAL CELLS SEEN     NO ORGANISMS SEEN   Culture NO GROWTH 3 DAYS   Final    Report Status 08/01/2011 FINAL   Final   MRSA PCR SCREENING     Status: Normal   Collection Time   07/29/11  8:56 PM      Component Value Range Status Comment   MRSA by PCR NEGATIVE  NEGATIVE  Final   CULTURE, BLOOD (ROUTINE X 2)     Status: Normal   Collection Time   07/31/11  5:31 PM      Component Value Range Status Comment   Specimen Description BLOOD RIGHT ARM   Final  Special Requests BOTTLES DRAWN AEROBIC ONLY 4.5CC   Final    Culture  Setup Time [id number]   Final    Culture     Final    Value: STAPHYLOCOCCUS SPECIES (COAGULASE NEGATIVE)     Note: THE SIGNIFICANCE OF ISOLATING THIS ORGANISM FROM A SINGLE VENIPUNCTURE CANNOT BE PREDICTED WITHOUT FURTHER CLINICAL AND CULTURE CORRELATION.  SUSCEPTIBILITIES AVAILABLE ONLY ON REQUEST.     Note: Gram Stain Report Called to,Read Back By and Verified With: Meade Maw RN on 08/03/11 at 05:20 by Christie Nottingham   Report Status 08/04/2011 FINAL   Final   CULTURE, BLOOD (ROUTINE X 2)     Status: Normal (Preliminary result)   Collection Time   08/03/11  4:41 PM      Component Value Range Status Comment   Specimen Description BLOOD RIGHT HAND   Final    Special Requests BOTTLES DRAWN AEROBIC ONLY 5CC   Final    Culture  Setup Time [id number]   Final    Culture     Final    Value:        BLOOD CULTURE RECEIVED NO GROWTH TO DATE CULTURE WILL BE HELD FOR 5 DAYS BEFORE ISSUING A FINAL NEGATIVE REPORT   Report Status PENDING   Incomplete     Studies/Results: No results found.   Assessment/Plan: Abdominal Abscess  MRSE Bacteremia due to [facility]  Day 8 of anbx  Would continue vanco.  Appreciate CV eval/partnering with general surgery .  No change in flagyl, cipro.  Repeat BCx/TEE pending.  Could consider changing PIC if repeat BCx positive (ie PIC seeded at re-implantation).    [name] Infectious Diseases [phone] 08/04/2011, 10:54 AM

## 2011-08-04 NOTE — Progress Notes (Signed)
Patient ID: [name], female   DOB: 10-27-1931, 78 y.o.   MRN: [id number]    Subjective: Pt feels ok.  No complaints.  No diarrhea.  Eating some.  Objective: Vital signs in last 24 hours: Temp:  [99.1 F (37.3 C)-100.6 F (38.1 C)] 99.1 F (37.3 C) (02/05 0550) Pulse Rate:  [104-108] 104  (02/05 0550) Resp:  [18-20] 18  (02/05 0550) BP: (128-139)/(61-64) 139/62 mmHg (02/05 0550) SpO2:  [94 %-95 %] 95 % (02/05 0550) Last BM Date: 08/03/11  Intake/Output from previous day: 02/04 0701 - 02/05 0700 In: 3646.9 [P.O.:120; I.V.:1036.9; TPN:2489.9] Out: -  Intake/Output this shift:    PE: Abd: soft, some purulent drainage from small opening just below her packed wound. +BS, G tube in place.  Lab Results:   [facility] 08/03/11 0550  WBC 8.1  HGB 8.3*  HCT 24.6*  PLT 295   BMET  Basename 08/04/11 0533 08/03/11 0550  NA 130* 131*  K 3.8 3.8  CL 101 102  CO2 22 21  GLUCOSE 135* 129*  BUN 15 13  CREATININE 0.49* 0.43*  CALCIUM 8.6 8.6   PT/INR No results found for this basename: LABPROT:2,INR:2 in the last 72 hours   Studies/Results: No results found.  Anti-infectives: Anti-infectives     Start     Dose/Rate Route Frequency Ordered Stop   08/02/11 1400   vancomycin (VANCOCIN) 1,250 mg in sodium chloride 0.9 % 250 mL IVPB        1,250 mg 166.7 mL/hr over 90 Minutes Intravenous Every 12 hours 08/02/11 1203     08/02/11 0345   ciprofloxacin (CIPRO) IVPB 400 mg  Status:  Discontinued        400 mg 200 mL/hr over 60 Minutes Intravenous 120 min pre-op 08/02/11 0340 08/02/11 0340   07/30/11 1400   metroNIDAZOLE (FLAGYL) tablet 500 mg        500 mg Oral 3 times per day 07/30/11 0829     07/30/11 1100   vancomycin (VANCOCIN) 750 mg in sodium chloride 0.9 % 150 mL IVPB  Status:  Discontinued        750 mg 150 mL/hr over 60 Minutes Intravenous Every 12 hours 07/30/11 1008 08/02/11 1155   07/30/11 0930   ciprofloxacin (CIPRO) tablet 500 mg        500 mg Oral 2 times  daily 07/30/11 0829     07/29/11 1130   ciprofloxacin (CIPRO) IVPB 400 mg  Status:  Discontinued        400 mg 200 mL/hr over 60 Minutes Intravenous Every 12 hours 07/29/11 1029 07/30/11 0829   07/28/11 2300   ceFEPIme (MAXIPIME) 1 g in dextrose 5 % 50 mL IVPB  Status:  Discontinued        1 g 100 mL/hr over 30 Minutes Intravenous Every 12 hours 07/28/11 2230 07/29/11 1029   07/28/11 2245   vancomycin (VANCOCIN) IVPB 1000 mg/200 mL premix        1,000 mg 200 mL/hr over 60 Minutes Intravenous  Once 07/28/11 2230 07/29/11 0211   07/28/11 2245   metroNIDAZOLE (FLAGYL) IVPB 500 mg  Status:  Discontinued        500 mg 100 mL/hr over 60 Minutes Intravenous Every 8 hours 07/28/11 2230 07/30/11 0829           Assessment/Plan  1. Bacteremia (coag neg staph) likely secondary to PICC  Plan: 1. Greatly appreciate infectious disease's assistance with this patient's care. 2. I have consulted cardiology for  a TEE, per their recommendations. 3. Continue current abx therapy 4. Cont wound care to abd wound.   LOS: 7 days    Skya Mccullum E 08/04/2011

## 2011-08-04 NOTE — Progress Notes (Signed)
Clinical Social Worker updated SNF to possible dc date of Friday.  CSW to continue to follow and assist as needed.   Angelia Mould, MSW, [place] [phone]

## 2011-08-04 NOTE — Progress Notes (Signed)
PARENTERAL NUTRITION CONSULT NOTE - FOLLOW UP   Pharmacy Consult for TPN Indication: Short gut  Allergies  Allergen Reactions  . Other Swelling    Pinto beans cause swelling and hives  . Penicillins Swelling    Patient Measurements: Height: 5' 6.5" (168.9 cm) Weight: 162 lb (73.483 kg) IBW/kg (Calculated) : 60.45  Usual Weight: 73.5  Vital Signs: Temp: 99.1 F (37.3 C) (02/05 0550) BP: 139/62 mmHg (02/05 0550) Pulse Rate: 104  (02/05 0550) Intake/Output from previous day: 02/04 0701 - 02/05 0700 In: 3646.9 [P.O.:120; I.V.:1036.9; TPN:2489.9] Out: -  Intake/Output from this shift:    Labs:  [facility] 08/03/11 0550  WBC 8.1  HGB 8.3*  HCT 24.6*  PLT 295  APTT --  INR --     Basename 08/04/11 0533 08/03/11 0550 08/02/11 0525  NA 130* 131* 128*  K 3.8 3.8 3.9  CL 101 102 100  CO2 22 21 21   GLUCOSE 135* 129* 153*  BUN 15 13 12   CREATININE 0.49* 0.43* 0.51  LABCREA -- -- --  CREAT24HRUR -- -- --  CALCIUM 8.6 8.6 8.6  MG -- 1.6 --  PHOS -- 4.1 --  PROT -- 6.2 --  ALBUMIN -- 1.8* --  AST -- 9 --  ALT -- 8 --  ALKPHOS -- 88 --  BILITOT -- 0.2* --  BILIDIR -- -- --  IBILI -- -- --  PREALBUMIN -- 7.0* --  TRIG -- 75 --  CHOLHDL -- -- --  CHOL -- 55 --   Estimated Creatinine Clearance: 61.1 ml/min (by C-G formula based on Cr of 0.49).    Basename 08/04/11 0800 08/04/11 0402 08/03/11 2359  GLUCAP 163* 136* 155*    Insulin Requirements in the past 24 hours:  10 units SSI  Current Nutrition:  PO: carb modified diet started 1/30. Clinimix E 5/15 at 80 ml/hr and lipids at 10 ml/hr MWF: provides weekly average of 1569 kcl and 96  protein per day.  Assessment: [name] admitted from [facility] where she is on chronic TPN and takes POs only for pleasure.  History of erosive esophagitis with perf pyloric ulcer s/p lap 11/7. s/p gastrostomy, gastrectomy, choleycystectomy. Last d/c 07/06/11. 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 Endo: DM.CBG slightly > goal of <150 mg/dl. 55 units insulin in TPN.  Lytes: Na remains low, chronic Renal: stable  Cards: HTN on Norvasc 5mg , benazepril 20mg , Clonidine 0.1 mg patch  Hepatobil: LFTs WNL, Trigl 75. Prealbumin improving slowly 6>7 Neuro: Seroquel  ID: Small wound/abscess on abdomen. Cultures negative. Patient has CNS bacteremia presumed due to PICC line infection on Vancomycin. Prophx: SQ heparin, po PPI  Nutritional Goals:  1500-1600 kCal, 90-105 grams of protein per day  Plan:  1.  Continue clinimix E5/15 at 80 ml/hr 2.  Continue lipids, MVI, TE, MWF only due to national shortage 3.  Increase insulin slightly in tna 4.  F/u am bmp, cbg, f/u mag with tna labs thursday  Jill Side L. Illene Bolus, PharmD, BCPS Clinical Pharmacist Pager: [phone] 08/04/2011 11:16 AM

## 2011-08-04 NOTE — Progress Notes (Signed)
Patient examined and I agree with the assessment and plan  [name], MD, MPH, FACS Pager: [phone]  08/04/2011 1:39 PM

## 2011-08-05 ENCOUNTER — Encounter (HOSPITAL_COMMUNITY): Admission: EM | Disposition: A | Discharge status: Skilled Nursing Facility | Payer: Self-pay | Source: Home / Self Care

## 2011-08-05 ENCOUNTER — Encounter (INDEPENDENT_AMBULATORY_CARE_PROVIDER_SITE_OTHER): Payer: Medicare HMO | Admitting: Surgery

## 2011-08-05 DIAGNOSIS — I369 Nonrheumatic tricuspid valve disorder, unspecified: Secondary | ICD-10-CM

## 2011-08-05 LAB — GLUCOSE, CAPILLARY
Glucose-Capillary: 153 mg/dL — ABNORMAL HIGH (ref 70–99)
Glucose-Capillary: 136 mg/dL — ABNORMAL HIGH (ref 70–99)
Glucose-Capillary: 136 mg/dL — ABNORMAL HIGH (ref 70–99)

## 2011-08-05 LAB — BASIC METABOLIC PANEL
CO2: 22 mEq/L (ref 19–32)
Calcium: 8.6 mg/dL (ref 8.4–10.5)
Creatinine, Ser: 0.48 mg/dL — ABNORMAL LOW (ref 0.50–1.10)
Glucose, Bld: 134 mg/dL — ABNORMAL HIGH (ref 70–99)

## 2011-08-05 SURGERY — ECHOCARDIOGRAM, TRANSESOPHAGEAL
Anesthesia: Moderate Sedation

## 2011-08-05 MED ORDER — TRACE MINERALS CR-CU-MN-SE-ZN 10-1000-500-60 MCG/ML IV SOLN
INTRAVENOUS | Status: AC
  Administered 2011-08-05: 18:00:00 via INTRAVENOUS
  Filled 2011-08-05: qty 2000

## 2011-08-05 MED ORDER — FAT EMULSION 20 % IV EMUL
240.0000 mL | INTRAVENOUS | Status: AC
  Administered 2011-08-05: 240 mL via INTRAVENOUS
  Filled 2011-08-05: qty 250

## 2011-08-05 NOTE — Progress Notes (Signed)
Patient ID: [name], female   DOB: [birth year], 78 y.o.   MRN: [id number]    Subjective: Pt feels ok.  No complaints.  Wants breakfast.  Objective: Vital signs in last 24 hours: Temp:  [98.5 F (36.9 C)-99.3 F (37.4 C)] 98.5 F (36.9 C) (02/06 0443) Pulse Rate:  [95-106] 95  (02/06 0443) Resp:  [18-20] 18  (02/06 0443) BP: (118-148)/(55-62) 118/58 mmHg (02/06 0443) SpO2:  [95 %-98 %] 95 % (02/06 0443) Last BM Date: 08/04/11  Intake/Output from previous day: 02/05 0701 - 02/06 0700 In: 3314 [I.V.:714.7; IV Piggyback:500; TPN:2099.3] Out: -  Intake/Output this shift:    PE: Gen: No distress, eating fruit currently CV: RRR Resp: CTABL Abd: soft, minimal purulent drainage from small opening just below her packed wound. +BS, G tube in place. Ext: 2+ pulses, no edema  Lab Results:   Basename 08/03/11 0550  WBC 8.1  HGB 8.3*  HCT 24.6*  PLT 295   BMET  Basename 08/05/11 0550 08/04/11 0533  NA 132* 130*  K 3.6 3.8  CL 102 101  CO2 22 22  GLUCOSE 134* 135*  BUN 12 15  CREATININE 0.48* 0.49*  CALCIUM 8.6 8.6   PT/INR No results found for this basename: LABPROT:2,INR:2 in the last 72 hours   Studies/Results: No results found.  Anti-infectives: Anti-infectives     Start     Dose/Rate Route Frequency Ordered Stop   08/05/11 0600   metroNIDAZOLE (FLAGYL) IVPB 500 mg  Status:  Discontinued        500 mg 100 mL/hr over 60 Minutes Intravenous On call to O.R. 08/04/11 1445 08/04/11 1533   08/02/11 1400   vancomycin (VANCOCIN) 1,250 mg in sodium chloride 0.9 % 250 mL IVPB        1,250 mg 166.7 mL/hr over 90 Minutes Intravenous Every 12 hours 08/02/11 1203     08/02/11 0345   ciprofloxacin (CIPRO) IVPB 400 mg  Status:  Discontinued        400 mg 200 mL/hr over 60 Minutes Intravenous 120 min pre-op 08/02/11 0340 08/02/11 0340   07/30/11 1400   metroNIDAZOLE (FLAGYL) tablet 500 mg        500 mg Oral 3 times per day 07/30/11 0829     07/30/11 1100   vancomycin  (VANCOCIN) 750 mg in sodium chloride 0.9 % 150 mL IVPB  Status:  Discontinued        750 mg 150 mL/hr over 60 Minutes Intravenous Every 12 hours 07/30/11 1008 08/02/11 1155   07/30/11 0930   ciprofloxacin (CIPRO) tablet 500 mg        500 mg Oral 2 times daily 07/30/11 0829     07/29/11 1130   ciprofloxacin (CIPRO) IVPB 400 mg  Status:  Discontinued        400 mg 200 mL/hr over 60 Minutes Intravenous Every 12 hours 07/29/11 1029 07/30/11 0829   07/28/11 2300   ceFEPIme (MAXIPIME) 1 g in dextrose 5 % 50 mL IVPB  Status:  Discontinued        1 g 100 mL/hr over 30 Minutes Intravenous Every 12 hours 07/28/11 2230 07/29/11 1029   07/28/11 2245   vancomycin (VANCOCIN) IVPB 1000 mg/200 mL premix        1,000 mg 200 mL/hr over 60 Minutes Intravenous  Once 07/28/11 2230 07/29/11 0211   07/28/11 2245   metroNIDAZOLE (FLAGYL) IVPB 500 mg  Status:  Discontinued        500 mg 100  mL/hr over 60 Minutes Intravenous Every 8 hours 07/28/11 2230 07/30/11 0829           Assessment/Plan  1. Bacteremia (coag neg staph) likely secondary to PICC  Plan: 1. Greatly appreciate infectious disease's assistance with this patient's care. 2. Cardiology not comfortable with TEE due to recent gastric surgery.  Plan to perform TTE instead. 3. Continue current abx therapy, most recent BCx NGTD as of this morning 4. Cont wound care to abd wound.   LOS: 8 days    Leith Hedlund, MD 08/05/2011

## 2011-08-05 NOTE — Progress Notes (Signed)
  Echocardiogram 2D Echocardiogram has been performed.  [name] [facility] 08/05/2011, 3:06 PM

## 2011-08-05 NOTE — Plan of Care (Signed)
Problem: Phase III Progression Outcomes Goal: Voiding independently Outcome: Completed/Met Date Met:  08/05/11 Pt is incontinent

## 2011-08-05 NOTE — Progress Notes (Signed)
Nutrition Follow-up  Diet Order:  CHO MOD MED with Ensure Complete 1x daily, Resouce Fruit Beverage tid        Pt reports eating small amounts of meals.  Drinking 2 cans Ensure Daily.  Pt is not drinking Resouce.  Pt reports no diarrhea.  Pt only eats for pleasure.  She [facility] secondary to short gut.  TPN (CLINIMIX E 5/15) at 80 ml/hr, 20% lipids at 10 ml/hr 3 times weekly due to national backorder.  This provides a weekly average of 1569 kcals and 96 g protein daily.    Meds: Scheduled Meds:   . amLODipine  5 mg Oral Daily  . benazepril  20 mg Oral Daily  . ciprofloxacin  500 mg Oral BID  . cloNIDine  0.1 mg Transdermal Weekly  . darifenacin  15 mg Oral Daily  . Ensure Plus  1 Can Oral Daily  . feeding supplement  1 Container Oral TID WC  . heparin  5,000 Units Subcutaneous Q8H  . insulin aspart  0-15 Units Subcutaneous Q4H  . metoprolol tartrate  12.5 mg Oral BID  . metroNIDAZOLE  500 mg Oral Q8H  . pantoprazole  40 mg Oral BID AC  . QUEtiapine  25 mg Oral QHS  . vancomycin  1,250 mg Intravenous Q12H  . DISCONTD: metronidazole  500 mg Intravenous On Call to OR   Continuous Infusions:   . 0.9 % NaCl with KCl 40 mEq / L 20 mL/hr (08/03/11 1952)  . fat emulsion 240 mL (08/03/11 1808)  . fat emulsion    . TPN (CLINIMIX) +/- additives 80 mL/hr at 08/04/11 1720  . TPN (CLINIMIX) +/- additives 80 mL/hr at 08/03/11 1808  . TPN (CLINIMIX) +/- additives     PRN Meds:.acetaminophen, butalbital-acetaminophen-caffeine, diphenhydrAMINE, diphenhydrAMINE, loperamide, ondansetron, oxyCODONE, sodium chloride  Labs:  CMP     Component Value Date/Time   NA 132* 08/05/2011 0550   K 3.6 08/05/2011 0550   CL 102 08/05/2011 0550   CO2 22 08/05/2011 0550   GLUCOSE 134* 08/05/2011 0550   BUN 12 08/05/2011 0550   CREATININE 0.48* 08/05/2011 0550   CALCIUM 8.6 08/05/2011 0550   PROT 6.2 08/03/2011 0550   ALBUMIN 1.8* 08/03/2011 0550   AST 9 08/03/2011 0550   ALT 8 08/03/2011 0550   ALKPHOS 88 08/03/2011  0550   BILITOT 0.2* 08/03/2011 0550   GFRNONAA >90 08/05/2011 0550   GFRAA >90 08/05/2011 0550     Intake/Output Summary (Last 24 hours) at 08/05/11 1154 Last data filed at 08/05/11 0900  Gross per 24 hour  Intake 3473.96 ml  Output      0 ml  Net 3473.96 ml    Weight Status:  No new weight  Re-estimated needs:  1500-1600 kcals, 90-105 g protein  Nutrition Dx:  Altered GI function-ongoing  Goal:  TPN to meet 100% estimated needs with po intake for pleasure.  Intervention:  Continue TPN and current diet with Ensure as pt desires.  Monitor:  TPN, weight trends, labs, po tolerance.   Jeoffrey Massed Pager #:  [phone]

## 2011-08-05 NOTE — Progress Notes (Signed)
PARENTERAL NUTRITION CONSULT NOTE - FOLLOW UP   Pharmacy Consult for TPN Indication: Short gut  Allergies  Allergen Reactions  . Other Swelling    Pinto beans cause swelling and hives  . Penicillins Swelling    Patient Measurements: Height: 5' 6.5" (168.9 cm) Weight: 162 lb (73.483 kg) IBW/kg (Calculated) : 60.45  Usual Weight: 73.5  Vital Signs: Temp: 98.5 F (36.9 C) (02/06 0443) Temp src: Oral (02/06 0443) BP: 118/58 mmHg (02/06 0443) Pulse Rate: 95  (02/06 0443) Intake/Output from previous day: 02/05 0701 - 02/06 0700 In: 3314 [I.V.:714.7; IV Piggyback:500; TPN:2099.3] Out: -  Intake/Output from this shift: Total I/O In: 160 [P.O.:160] Out: -   Labs:  [facility] 08/03/11 0550  WBC 8.1  HGB 8.3*  HCT 24.6*  PLT 295  APTT --  INR --     Basename 08/05/11 0550 08/04/11 0533 08/03/11 0550  NA 132* 130* 131*  K 3.6 3.8 3.8  CL 102 101 102  CO2 22 22 21   GLUCOSE 134* 135* 129*  BUN 12 15 13   CREATININE 0.48* 0.49* 0.43*  LABCREA -- -- --  CREAT24HRUR -- -- --  CALCIUM 8.6 8.6 8.6  MG -- -- 1.6  PHOS -- -- 4.1  PROT -- -- 6.2  ALBUMIN -- -- 1.8*  AST -- -- 9  ALT -- -- 8  ALKPHOS -- -- 88  BILITOT -- -- 0.2*  BILIDIR -- -- --  IBILI -- -- --  PREALBUMIN -- -- 7.0*  TRIG -- -- 75  CHOLHDL -- -- --  CHOL -- -- 55   Estimated Creatinine Clearance: 61.1 ml/min (by C-G formula based on Cr of 0.48).    Basename 08/05/11 0752 08/05/11 0442 08/05/11 0029  GLUCAP 136* 153* 145*    Insulin Requirements in the past 24 hours:  10 units SSI with 60 units insulin in TNA  Current Nutrition:  PO: carb modified diet started 1/30. Clinimix E 5/15 at 80 ml/hr and lipids at 10 ml/hr MWF: provides weekly average of 1569 kcl and 96  protein per day.  Assessment: [name] admitted from [facility] where she is on chronic TPN and takes POs only for pleasure.  History of erosive esophagitis with perf pyloric ulcer s/p lap 11/7. s/p gastrostomy, gastrectomy,  choleycystectomy. Last d/c 07/06/11. 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  Endo: DM.CBG slightly > goal of <150 mg/dl. 55 units insulin in TPN.  Lytes: Na remains low, chronic Renal: stable  Cards: HTN on Norvasc 5mg , benazepril 20mg , Clonidine 0.1 mg patch  Hepatobil: LFTs WNL, Trigl 75. Prealbumin improving slowly 6>7 Neuro: Seroquel  ID: Small wound/abscess on abdomen. AF, WBC stable at 8.1.  Cultures negative. Patient has CNS bacteremia presumed due to PICC line infection on Vancomycin.  Plan to TTE today (instead of TEE due to recent surgery) Prophx: SQ heparin, po PPI  Nutritional Goals:  1500-1600 kCal, 90-105 grams of protein per day  Plan:  1.  Continue clinimix E5/15 at 80 ml/hr 2.  Continue lipids, MVI, TE, MWF only due to national shortage 3.  Increase insulin slightly in tna (60>65) 4.  F/u am tna labs  [employer]. Illene Bolus, PharmD, BCPS Clinical Pharmacist Pager: [phone] 08/05/2011 9:50 AM

## 2011-08-05 NOTE — Progress Notes (Signed)
ANTIBIOTIC CONSULT NOTE - FOLLOW UP  Pharmacy Consult for Vancomycin Indication: PICC related bacteremia  Allergies  Allergen Reactions  . Other Swelling    Pinto beans cause swelling and hives  . Penicillins Swelling    Patient Measurements: Height: 5' 6.5" (168.9 cm) Weight: 162 lb (73.483 kg) IBW/kg (Calculated) : 60.45  Adjusted Body Weight:   Vital Signs: Temp: 98.5 F (36.9 C) (02/06 0443) Temp src: Oral (02/06 0443) BP: 118/58 mmHg (02/06 0443) Pulse Rate: 95  (02/06 0443) Intake/Output from previous day: 02/05 0701 - 02/06 0700 In: 3314 [I.V.:714.7; IV Piggyback:500; TPN:2099.3] Out: -  Intake/Output from this shift: Total I/O In: 160 [P.O.:160] Out: -   Labs:  Basename 08/05/11 0550 08/04/11 0533 08/03/11 0550  WBC -- -- 8.1  HGB -- -- 8.3*  PLT -- -- 295  LABCREA -- -- --  CREATININE 0.48* 0.49* 0.43*   Estimated Creatinine Clearance: 61.1 ml/min (by C-G formula based on Cr of 0.48). No results found for this basename: VANCOTROUGH:2,VANCOPEAK:2,VANCORANDOM:2,GENTTROUGH:2,GENTPEAK:2,GENTRANDOM:2,TOBRATROUGH:2,TOBRAPEAK:2,TOBRARND:2,AMIKACINPEAK:2,AMIKACINTROU:2,AMIKACIN:2, in the last 72 hours   Microbiology: Recent Results (from the past 720 hour(s))  CULTURE, BLOOD (ROUTINE X 2)     Status: Normal   Collection Time   07/28/11 11:30 PM      Component Value Range Status Comment   Specimen Description BLOOD RIGHT HAND   Final    Special Requests BOTTLES DRAWN AEROBIC ONLY 4CC   Final    Culture  Setup Time [id number]   Final    Culture     Final    Value: STAPHYLOCOCCUS SPECIES (COAGULASE NEGATIVE)     Note: SUSCEPTIBILITIES PERFORMED ON PREVIOUS CULTURE WITHIN THE LAST 5 DAYS.     Note: Gram Stain Report Called to,Read Back By and Verified With: PATTY MOSS 07/30/11 0900 BY SMITHERSJ   Report Status 08/01/2011 FINAL   Final   CULTURE, BLOOD (ROUTINE X 2)     Status: Normal   Collection Time   07/28/11 11:42 PM      Component Value Range Status  Comment   Specimen Description BLOOD HAND LEFT   Final    Special Requests BOTTLES DRAWN AEROBIC ONLY 5CC   Final    Culture  Setup Time [id number]   Final    Culture     Final    Value: STAPHYLOCOCCUS SPECIES (COAGULASE NEGATIVE)     Note: RIFAMPIN AND GENTAMICIN SHOULD NOT BE USED AS SINGLE DRUGS FOR TREATMENT OF STAPH INFECTIONS.     Note: Gram Stain Report Called to,Read Back By and Verified With: PATTI MOSS 0740 07/30/2011 BY WOODB   Report Status 08/01/2011 FINAL   Final    Organism ID, Bacteria STAPHYLOCOCCUS SPECIES (COAGULASE NEGATIVE)   Final   WOUND CULTURE     Status: Normal   Collection Time   07/29/11  1:08 PM      Component Value Range Status Comment   Specimen Description WOUND ABDOMEN   Final    Special Requests NONE   Final    Gram Stain     Final    Value: RARE WBC PRESENT, PREDOMINANTLY PMN     NO SQUAMOUS EPITHELIAL CELLS SEEN     NO ORGANISMS SEEN   Culture NO GROWTH 3 DAYS   Final    Report Status 08/01/2011 FINAL   Final   MRSA PCR SCREENING     Status: Normal   Collection Time   07/29/11  8:56 PM      Component Value Range Status Comment   MRSA  by PCR NEGATIVE  NEGATIVE  Final   CULTURE, BLOOD (ROUTINE X 2)     Status: Normal   Collection Time   07/31/11  5:31 PM      Component Value Range Status Comment   Specimen Description BLOOD RIGHT ARM   Final    Special Requests BOTTLES DRAWN AEROBIC ONLY 4.5CC   Final    Culture  Setup Time [id number]   Final    Culture     Final    Value: STAPHYLOCOCCUS SPECIES (COAGULASE NEGATIVE)     Note: THE SIGNIFICANCE OF ISOLATING THIS ORGANISM FROM A SINGLE VENIPUNCTURE CANNOT BE PREDICTED WITHOUT FURTHER CLINICAL AND CULTURE CORRELATION. SUSCEPTIBILITIES AVAILABLE ONLY ON REQUEST.     Note: Gram Stain Report Called to,Read Back By and Verified With: Meade Maw RN on 08/03/11 at 05:20 by Christie Nottingham   Report Status 08/04/2011 FINAL   Final   CULTURE, BLOOD (ROUTINE X 2)     Status: Normal (Preliminary result)    Collection Time   08/03/11  4:41 PM      Component Value Range Status Comment   Specimen Description BLOOD RIGHT HAND   Final    Special Requests BOTTLES DRAWN AEROBIC ONLY 5CC   Final    Culture  Setup Time [id number]   Final    Culture     Final    Value:        BLOOD CULTURE RECEIVED NO GROWTH TO DATE CULTURE WILL BE HELD FOR 5 DAYS BEFORE ISSUING A FINAL NEGATIVE REPORT   Report Status PENDING   Incomplete     Anti-infectives     Start     Dose/Rate Route Frequency Ordered Stop   08/05/11 0600   metroNIDAZOLE (FLAGYL) IVPB 500 mg  Status:  Discontinued        500 mg 100 mL/hr over 60 Minutes Intravenous On call to O.R. 08/04/11 1445 08/04/11 1533   08/02/11 1400   vancomycin (VANCOCIN) 1,250 mg in sodium chloride 0.9 % 250 mL IVPB        1,250 mg 166.7 mL/hr over 90 Minutes Intravenous Every 12 hours 08/02/11 1203     08/02/11 0345   ciprofloxacin (CIPRO) IVPB 400 mg  Status:  Discontinued        400 mg 200 mL/hr over 60 Minutes Intravenous 120 min pre-op 08/02/11 0340 08/02/11 0340   07/30/11 1400   metroNIDAZOLE (FLAGYL) tablet 500 mg        500 mg Oral 3 times per day 07/30/11 0829     07/30/11 1100   vancomycin (VANCOCIN) 750 mg in sodium chloride 0.9 % 150 mL IVPB  Status:  Discontinued        750 mg 150 mL/hr over 60 Minutes Intravenous Every 12 hours 07/30/11 1008 08/02/11 1155   07/30/11 0930   ciprofloxacin (CIPRO) tablet 500 mg        500 mg Oral 2 times daily 07/30/11 0829     07/29/11 1130   ciprofloxacin (CIPRO) IVPB 400 mg  Status:  Discontinued        400 mg 200 mL/hr over 60 Minutes Intravenous Every 12 hours 07/29/11 1029 07/30/11 0829   07/28/11 2300   ceFEPIme (MAXIPIME) 1 g in dextrose 5 % 50 mL IVPB  Status:  Discontinued        1 g 100 mL/hr over 30 Minutes Intravenous Every 12 hours 07/28/11 2230 07/29/11 1029   07/28/11 2245   vancomycin (VANCOCIN) IVPB 1000 mg/200 mL premix  1,000 mg 200 mL/hr over 60 Minutes Intravenous  Once 07/28/11  2230 07/29/11 0211   07/28/11 2245   metroNIDAZOLE (FLAGYL) IVPB 500 mg  Status:  Discontinued        500 mg 100 mL/hr over 60 Minutes Intravenous Every 8 hours 07/28/11 2230 07/30/11 0829          Assessment: 79yof on Vancomycin Day  for Coag Negative Staph (CNS) bacteremia/sepsis. 2/2 blood cultures drawn on 1/29 grew CNS only sensitive to Vancomycin and Rifampin. Repeat blood cultures drawn 2/1 are NGTD. Pt's SCr remains stable (SCr 0.51, CrCl 61) and UOP is good. Vancomycin trough on 2/3 was 8.6 and dose adjusted. ID has been consulted and is advising on LOT.   Goal of Therapy:  Vancomycin trough level 15-20 mcg/ml  Plan:  1. Cont vanc 1250 mg IV q12 hours 2. Follow-up renal function, UOP, ID recommendations.  Recheck trough ~ 2/10.   Talbert Cage [place] [phone] 08/05/2011,10:46 AM

## 2011-08-05 NOTE — Progress Notes (Signed)
INFECTIOUS DISEASE PROGRESS NOTE  ID: [name] is a 78 y.o. female with  abd abscess, PIC bacteremia (MRSE) Active Problems:  * No active hospital problems. *   Subjective: Getting bath  Abtx:  Anti-infectives     Start     Dose/Rate Route Frequency Ordered Stop   08/05/11 0600   metroNIDAZOLE (FLAGYL) IVPB 500 mg  Status:  Discontinued        500 mg 100 mL/hr over 60 Minutes Intravenous On call to O.R. 08/04/11 1445 08/04/11 1533   08/02/11 1400   vancomycin (VANCOCIN) 1,250 mg in sodium chloride 0.9 % 250 mL IVPB        1,250 mg 166.7 mL/hr over 90 Minutes Intravenous Every 12 hours 08/02/11 1203     08/02/11 0345   ciprofloxacin (CIPRO) IVPB 400 mg  Status:  Discontinued        400 mg 200 mL/hr over 60 Minutes Intravenous 120 min pre-op 08/02/11 0340 08/02/11 0340   07/30/11 1400   metroNIDAZOLE (FLAGYL) tablet 500 mg        500 mg Oral 3 times per day 07/30/11 0829     07/30/11 1100   vancomycin (VANCOCIN) 750 mg in sodium chloride 0.9 % 150 mL IVPB  Status:  Discontinued        750 mg 150 mL/hr over 60 Minutes Intravenous Every 12 hours 07/30/11 1008 08/02/11 1155   07/30/11 0930   ciprofloxacin (CIPRO) tablet 500 mg        500 mg Oral 2 times daily 07/30/11 0829     07/29/11 1130   ciprofloxacin (CIPRO) IVPB 400 mg  Status:  Discontinued        400 mg 200 mL/hr over 60 Minutes Intravenous Every 12 hours 07/29/11 1029 07/30/11 0829   07/28/11 2300   ceFEPIme (MAXIPIME) 1 g in dextrose 5 % 50 mL IVPB  Status:  Discontinued        1 g 100 mL/hr over 30 Minutes Intravenous Every 12 hours 07/28/11 2230 07/29/11 1029   07/28/11 2245   vancomycin (VANCOCIN) IVPB 1000 mg/200 mL premix        1,000 mg 200 mL/hr over 60 Minutes Intravenous  Once 07/28/11 2230 07/29/11 0211   07/28/11 2245   metroNIDAZOLE (FLAGYL) IVPB 500 mg  Status:  Discontinued        500 mg 100 mL/hr over 60 Minutes Intravenous Every 8 hours 07/28/11 2230 07/30/11 0829          Medications:    Scheduled:   . amLODipine  5 mg Oral Daily  . benazepril  20 mg Oral Daily  . ciprofloxacin  500 mg Oral BID  . cloNIDine  0.1 mg Transdermal Weekly  . darifenacin  15 mg Oral Daily  . Ensure Plus  1 Can Oral Daily  . feeding supplement  1 Container Oral TID WC  . heparin  5,000 Units Subcutaneous Q8H  . insulin aspart  0-15 Units Subcutaneous Q4H  . metoprolol tartrate  12.5 mg Oral BID  . metroNIDAZOLE  500 mg Oral Q8H  . pantoprazole  40 mg Oral BID AC  . QUEtiapine  25 mg Oral QHS  . vancomycin  1,250 mg Intravenous Q12H    Objective: Vital signs in last 24 hours: Temp:  [98.5 F (36.9 C)-99.3 F (37.4 C)] 98.5 F (36.9 C) (02/06 1545) Pulse Rate:  [95-110] 110  (02/06 1545) Resp:  [18-20] 18  (02/06 1545) BP: (118-159)/(58-75) 159/75 mmHg (02/06 1545) SpO2:  [  95 %-100 %] 100 % (02/06 1545)   General appearance: no distress  Lab Results  Basename 08/05/11 0550 08/04/11 0533 08/03/11 0550  WBC -- -- 8.1  HGB -- -- 8.3*  HCT -- -- 24.6*  NA 132* 130* --  K 3.6 3.8 --  CL 102 101 --  CO2 22 22 --  BUN 12 15 --  CREATININE 0.48* 0.49* --  GLU -- -- --   Liver Panel  Basename 08/03/11 0550  PROT 6.2  ALBUMIN 1.8*  AST 9  ALT 8  ALKPHOS 88  BILITOT 0.2*  BILIDIR --  IBILI --   Sedimentation Rate No results found for this basename: ESRSEDRATE in the last 72 hours C-Reactive Protein No results found for this basename: CRP:2 in the last 72 hours  Microbiology: Recent Results (from the past 240 hour(s))  CULTURE, BLOOD (ROUTINE X 2)     Status: Normal   Collection Time   07/28/11 11:30 PM      Component Value Range Status Comment   Specimen Description BLOOD RIGHT HAND   Final    Special Requests BOTTLES DRAWN AEROBIC ONLY 4CC   Final    Culture  Setup Time [id number]   Final    Culture     Final    Value: STAPHYLOCOCCUS SPECIES (COAGULASE NEGATIVE)     Note: SUSCEPTIBILITIES PERFORMED ON PREVIOUS CULTURE WITHIN THE LAST 5 DAYS.     Note: Gram  Stain Report Called to,Read Back By and Verified With: PATTY MOSS 07/30/11 0900 BY SMITHERSJ   Report Status 08/01/2011 FINAL   Final   CULTURE, BLOOD (ROUTINE X 2)     Status: Normal   Collection Time   07/28/11 11:42 PM      Component Value Range Status Comment   Specimen Description BLOOD HAND LEFT   Final    Special Requests BOTTLES DRAWN AEROBIC ONLY 5CC   Final    Culture  Setup Time [id number]   Final    Culture     Final    Value: STAPHYLOCOCCUS SPECIES (COAGULASE NEGATIVE)     Note: RIFAMPIN AND GENTAMICIN SHOULD NOT BE USED AS SINGLE DRUGS FOR TREATMENT OF STAPH INFECTIONS.     Note: Gram Stain Report Called to,Read Back By and Verified With: PATTI MOSS 0740 07/30/2011 BY WOODB   Report Status 08/01/2011 FINAL   Final    Organism ID, Bacteria STAPHYLOCOCCUS SPECIES (COAGULASE NEGATIVE)   Final   WOUND CULTURE     Status: Normal   Collection Time   07/29/11  1:08 PM      Component Value Range Status Comment   Specimen Description WOUND ABDOMEN   Final    Special Requests NONE   Final    Gram Stain     Final    Value: RARE WBC PRESENT, PREDOMINANTLY PMN     NO SQUAMOUS EPITHELIAL CELLS SEEN     NO ORGANISMS SEEN   Culture NO GROWTH 3 DAYS   Final    Report Status 08/01/2011 FINAL   Final   MRSA PCR SCREENING     Status: Normal   Collection Time   07/29/11  8:56 PM      Component Value Range Status Comment   MRSA by PCR NEGATIVE  NEGATIVE  Final   CULTURE, BLOOD (ROUTINE X 2)     Status: Normal   Collection Time   07/31/11  5:31 PM      Component Value Range Status Comment   Specimen Description  BLOOD RIGHT ARM   Final    Special Requests BOTTLES DRAWN AEROBIC ONLY 4.5CC   Final    Culture  Setup Time [id number]   Final    Culture     Final    Value: STAPHYLOCOCCUS SPECIES (COAGULASE NEGATIVE)     Note: THE SIGNIFICANCE OF ISOLATING THIS ORGANISM FROM A SINGLE VENIPUNCTURE CANNOT BE PREDICTED WITHOUT FURTHER CLINICAL AND CULTURE CORRELATION. SUSCEPTIBILITIES AVAILABLE  ONLY ON REQUEST.     Note: Gram Stain Report Called to,Read Back By and Verified With: Meade Maw RN on 08/03/11 at 05:20 by Christie Nottingham   Report Status 08/04/2011 FINAL   Final   CULTURE, BLOOD (ROUTINE X 2)     Status: Normal (Preliminary result)   Collection Time   08/03/11  4:41 PM      Component Value Range Status Comment   Specimen Description BLOOD RIGHT HAND   Final    Special Requests BOTTLES DRAWN AEROBIC ONLY 5CC   Final    Culture  Setup Time [id number]   Final    Culture     Final    Value:        BLOOD CULTURE RECEIVED NO GROWTH TO DATE CULTURE WILL BE HELD FOR 5 DAYS BEFORE ISSUING A FINAL NEGATIVE REPORT   Report Status PENDING   Incomplete     Studies/Results: No results found.   Assessment/Plan: Abdominal Abscess  MRSE Bacteremia due to [facility]  Day 9 of anbx  Would continue vanco.  Got TTE due to recent abd surgery (significantly less sensitive than TEE).   No change in flagyl, cipro.  Repeat BCx (08-03-11) NGTD.  Could consider changing PIC if repeat BCx positive (ie PIC seeded at re-implantation).    [name] Infectious Diseases [phone] 08/05/2011, 5:10 PM

## 2011-08-05 NOTE — Progress Notes (Signed)
Pt's BP - 157/95, P - 123.  MD notified, no new orders received.  Will continue to monitor and pass info along to next shift RN.  Hector Shade Speed

## 2011-08-05 NOTE — Progress Notes (Signed)
Patient examined and I agree with the assessment and plan  [name], MD, MPH, FACS Pager: [phone]  08/05/2011 2:40 PM

## 2011-08-06 LAB — COMPREHENSIVE METABOLIC PANEL
Albumin: 1.8 g/dL — ABNORMAL LOW (ref 3.5–5.2)
BUN: 13 mg/dL (ref 6–23)
Chloride: 104 mEq/L (ref 96–112)
Creatinine, Ser: 0.47 mg/dL — ABNORMAL LOW (ref 0.50–1.10)
GFR calc Af Amer: >90 mL/min (ref >90)
Total Bilirubin: 0.1 mg/dL — ABNORMAL LOW (ref 0.3–1.2)
Total Protein: 6.2 g/dL (ref 6.0–8.3)

## 2011-08-06 LAB — CBC
Hemoglobin: 8.2 g/dL — ABNORMAL LOW (ref 12.0–15.0)
MCHC: 32.9 g/dL (ref 30.0–36.0)
Platelets: 327 10*3/uL (ref 150–400)

## 2011-08-06 LAB — GLUCOSE, CAPILLARY
Glucose-Capillary: 135 mg/dL — ABNORMAL HIGH (ref 70–99)
Glucose-Capillary: 140 mg/dL — ABNORMAL HIGH (ref 70–99)
Glucose-Capillary: 156 mg/dL — ABNORMAL HIGH (ref 70–99)
Glucose-Capillary: 157 mg/dL — ABNORMAL HIGH (ref 70–99)

## 2011-08-06 LAB — PHOSPHORUS: Phosphorus: 4.6 mg/dL (ref 2.3–4.6)

## 2011-08-06 LAB — MAGNESIUM: Magnesium: 1.5 mg/dL (ref 1.5–2.5)

## 2011-08-06 LAB — VANCOMYCIN, TROUGH: Vancomycin Tr: 14.6 ug/mL (ref 10.0–20.0)

## 2011-08-06 MED ORDER — VANCOMYCIN HCL 1000 MG IV SOLR
1500.0000 mg | Freq: Two times a day (BID) | INTRAVENOUS | Status: DC
  Administered 2011-08-07: 1500 mg via INTRAVENOUS
  Filled 2011-08-06 (×3): qty 1500

## 2011-08-06 MED ORDER — INSULIN REGULAR HUMAN 100 UNIT/ML IJ SOLN
INTRAVENOUS | Status: AC
  Administered 2011-08-06: 17:00:00 via INTRAVENOUS
  Filled 2011-08-06: qty 2000

## 2011-08-06 MED ORDER — MAGNESIUM SULFATE 40 MG/ML IJ SOLN
2.0000 g | Freq: Once | INTRAMUSCULAR | Status: AC
  Administered 2011-08-06: 2 g via INTRAVENOUS
  Filled 2011-08-06: qty 50

## 2011-08-06 NOTE — Progress Notes (Signed)
PARENTERAL NUTRITION CONSULT NOTE - FOLLOW UP   Pharmacy Consult for TPN Indication: Short gut  Allergies  Allergen Reactions  . Other Swelling    Pinto beans cause swelling and hives  . Penicillins Swelling    Patient Measurements: Height: 5' 6.5" (168.9 cm) Weight: 162 lb (73.483 kg) IBW/kg (Calculated) : 60.45  Usual Weight: 73.5  Vital Signs: Temp: 99.2 F (37.3 C) (02/07 0942) Temp src: Oral (02/07 0942) BP: 141/73 mmHg (02/07 0942) Pulse Rate: 96  (02/07 0942) Intake/Output from previous day: 02/06 0701 - 02/07 0700 In: 3051.7 [P.O.:160; I.V.:908.5; TPN:1983.2] Out: -  Intake/Output from this shift:    Labs:  [facility] 08/06/11 0645  WBC 8.9  HGB 8.2*  HCT 24.9*  PLT 327  APTT --  INR --     Basename 08/06/11 0645 08/05/11 0550 08/04/11 0533  NA 133* 132* 130*  K 3.9 3.6 3.8  CL 104 102 101  CO2 22 22 22   GLUCOSE 122* 134* 135*  BUN 13 12 15   CREATININE 0.47* 0.48* 0.49*  LABCREA -- -- --  CREAT24HRUR -- -- --  CALCIUM 8.4 8.6 8.6  MG 1.5 -- --  PHOS 4.6 -- --  PROT 6.2 -- --  ALBUMIN 1.8* -- --  AST 15 -- --  ALT 5 -- --  ALKPHOS 95 -- --  BILITOT 0.1* -- --  BILIDIR -- -- --  IBILI -- -- --  PREALBUMIN -- -- --  TRIG -- -- --  CHOLHDL -- -- --  CHOL -- -- --   Estimated Creatinine Clearance: 61.1 ml/min (by C-G formula based on Cr of 0.47).    Basename 08/06/11 0807 08/06/11 0445 08/05/11 2348  GLUCAP 140* 135* 157*    Insulin Requirements in the past 24 hours:  10 units SSI with 65 units insulin in TNA  Current Nutrition:  PO: carb modified diet started 1/30. Clinimix E 5/15 at 80 ml/hr and lipids at 10 ml/hr MWF: provides weekly average of 1569 kcl and 96  protein per day.  Assessment: [name] admitted from [facility] where she is on chronic TPN and takes POs only for pleasure.  History of erosive esophagitis with perf pyloric ulcer s/p lap 11/7. s/p gastrostomy, gastrectomy, choleycystectomy. Last d/c 07/06/11. 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  Endo: DM.CBG well controlled at goal of <150 mg/dl. Lytes: Na remains low, chronic; phos trending up - will leave lytes in for today but may need to remove from next bag.  Watch ca/phos pdt (currently 85); mag low normal Renal: stable  Cards: HTN on Norvasc 5mg , benazepril 20mg , Clonidine 0.1 mg patch  Hepatobil: LFTs WNL, Trigl 75. Prealbumin improving slowly 6>7 Neuro: Seroquel  ID: Small wound/abscess on abdomen. AF, WBC stable at 8.1.  Cultures negative. Patient has CNS bacteremia presumed due to PICC line infection on Vancomycin.  TTE negative Prophx: SQ heparin, po PPI  Nutritional Goals:  1500-1600 kCal, 90-105 grams of protein per day  Plan:  1.  Continue clinimix E5/15 at 80 ml/hr 2.  Continue lipids, MVI, TE, MWF only due to national shortage 3.  Replete mag with 2 g mag iv x1 4.  F/u am bmp, phos, mag, cbg  Airabella Barley L. Illene Bolus, PharmD, BCPS Clinical Pharmacist Pager: [phone] 08/06/2011 12:07 PM

## 2011-08-06 NOTE — Progress Notes (Signed)
  Subjective: Pt ok. No New c/o. Appetite fair.  Objective: Vital signs in last 24 hours: Temp:  [98.5 F (36.9 C)-100.4 F (38 C)] 99.1 F (37.3 C) (02/07 0449) Pulse Rate:  [94-123] 94  (02/07 0449) Resp:  [18-20] 18  (02/07 0449) BP: (145-159)/(60-95) 145/60 mmHg (02/07 0449) SpO2:  [96 %-100 %] 96 % (02/07 0449) Last BM Date: 08/05/11  Intake/Output this shift:    Physical Exam: BP 145/60  Pulse 94  Temp(Src) 99.1 F (37.3 C) (Oral)  Resp 18  Ht 5' 6.5" (1.689 m)  Wt 73.483 kg (162 lb)  BMI 25.76 kg/m2  SpO2 96% Abdomen: soft, NT G-tube intact Upper midline wounds still some mild drainage, but no erythema or tenderness.  Labs: CBC  Basename 08/06/11 0645  WBC 8.9  HGB 8.2*  HCT 24.9*  PLT 327   BMET  Basename 08/06/11 0645 08/05/11 0550  NA 133* 132*  K 3.9 3.6  CL 104 102  CO2 22 22  GLUCOSE 122* 134*  BUN 13 12  CREATININE 0.47* 0.48*  CALCIUM 8.4 8.6   LFT  Basename 08/06/11 0645  PROT 6.2  ALBUMIN 1.8*  AST 15  ALT 5  ALKPHOS 95  BILITOT 0.1*  BILIDIR --  IBILI --  LIPASE --   PT/INR No results found for this basename: LABPROT:2,INR:2 in the last 72 hours ABG No results found for this basename: PHART:2,PCO2:2,PO2:2,HCO3:2 in the last 72 hours  Studies/Results: No results found.  Assessment/Plan: 1. Bacteremia (coag neg staph) likely secondary to PICC  Plan:  1. Greatly appreciate infectious disease's assistance with this patient's care.  2. Cardiology not comfortable with TEE due to recent gastric surgery. Plan to perform TTE instead. No gross vegetations obvious. 3. Continue current abx therapy, repeat Blood Cx 2/4-No growth so far.  4. Cont wound care to abd wound.      LOS: 9 days    Alyse Low 08/06/2011 8:20 AM

## 2011-08-06 NOTE — Progress Notes (Signed)
Looking better Patient examined and I agree with the assessment and plan  [name], MD, MPH, FACS Pager: [phone]  08/06/2011 10:09 AM

## 2011-08-06 NOTE — Progress Notes (Signed)
ANTIBIOTIC CONSULT NOTE - FOLLOW UP  Pharmacy Consult for vancomycin Indication: PICC related MRSE bacteremia  Allergies  Allergen Reactions  . Other Swelling    Pinto beans cause swelling and hives  . Penicillins Swelling    Patient Measurements: Height: 5' 6.5" (168.9 cm) Weight: 162 lb (73.483 kg) IBW/kg (Calculated) : 60.45  Adjusted Body Weight:   Vital Signs: Temp: 98.9 F (37.2 C) (02/07 1300) Temp src: Oral (02/07 1300) BP: 154/79 mmHg (02/07 1300) Pulse Rate: 101  (02/07 1300) Intake/Output from previous day: 02/06 0701 - 02/07 0700 In: 3051.7 [P.O.:160; I.V.:908.5; TPN:1983.2] Out: -  Intake/Output from this shift: Total I/O In: 1520 [P.O.:480; I.V.:180; IV Piggyback:50; TPN:810] Out: -   Labs:  Basename 08/06/11 0645 08/05/11 0550 08/04/11 0533  WBC 8.9 -- --  HGB 8.2* -- --  PLT 327 -- --  LABCREA -- -- --  CREATININE 0.47* 0.48* 0.49*   Estimated Creatinine Clearance: 61.1 ml/min (by C-G formula based on Cr of 0.47).  Basename 08/06/11 1330  VANCOTROUGH 14.6  VANCOPEAK --  VANCORANDOM --  GENTTROUGH --  GENTPEAK --  GENTRANDOM --  TOBRATROUGH --  TOBRAPEAK --  TOBRARND --  AMIKACINPEAK --  AMIKACINTROU --  AMIKACIN --     Microbiology: Recent Results (from the past 720 hour(s))  CULTURE, BLOOD (ROUTINE X 2)     Status: Normal   Collection Time   07/28/11 11:30 PM      Component Value Range Status Comment   Specimen Description BLOOD RIGHT HAND   Final    Special Requests BOTTLES DRAWN AEROBIC ONLY 4CC   Final    Culture  Setup Time [id number]   Final    Culture     Final    Value: STAPHYLOCOCCUS SPECIES (COAGULASE NEGATIVE)     Note: SUSCEPTIBILITIES PERFORMED ON PREVIOUS CULTURE WITHIN THE LAST 5 DAYS.     Note: Gram Stain Report Called to,Read Back By and Verified With: PATTY MOSS 07/30/11 0900 BY SMITHERSJ   Report Status 08/01/2011 FINAL   Final   CULTURE, BLOOD (ROUTINE X 2)     Status: Normal   Collection Time   07/28/11  11:42 PM      Component Value Range Status Comment   Specimen Description BLOOD HAND LEFT   Final    Special Requests BOTTLES DRAWN AEROBIC ONLY 5CC   Final    Culture  Setup Time [id number]   Final    Culture     Final    Value: STAPHYLOCOCCUS SPECIES (COAGULASE NEGATIVE)     Note: RIFAMPIN AND GENTAMICIN SHOULD NOT BE USED AS SINGLE DRUGS FOR TREATMENT OF STAPH INFECTIONS.     Note: Gram Stain Report Called to,Read Back By and Verified With: PATTI MOSS 0740 07/30/2011 BY WOODB   Report Status 08/01/2011 FINAL   Final    Organism ID, Bacteria STAPHYLOCOCCUS SPECIES (COAGULASE NEGATIVE)   Final   WOUND CULTURE     Status: Normal   Collection Time   07/29/11  1:08 PM      Component Value Range Status Comment   Specimen Description WOUND ABDOMEN   Final    Special Requests NONE   Final    Gram Stain     Final    Value: RARE WBC PRESENT, PREDOMINANTLY PMN     NO SQUAMOUS EPITHELIAL CELLS SEEN     NO ORGANISMS SEEN   Culture NO GROWTH 3 DAYS   Final    Report Status 08/01/2011 FINAL   Final  MRSA PCR SCREENING     Status: Normal   Collection Time   07/29/11  8:56 PM      Component Value Range Status Comment   MRSA by PCR NEGATIVE  NEGATIVE  Final   CULTURE, BLOOD (ROUTINE X 2)     Status: Normal   Collection Time   07/31/11  5:31 PM      Component Value Range Status Comment   Specimen Description BLOOD RIGHT ARM   Final    Special Requests BOTTLES DRAWN AEROBIC ONLY 4.5CC   Final    Culture  Setup Time [id number]   Final    Culture     Final    Value: STAPHYLOCOCCUS SPECIES (COAGULASE NEGATIVE)     Note: THE SIGNIFICANCE OF ISOLATING THIS ORGANISM FROM A SINGLE VENIPUNCTURE CANNOT BE PREDICTED WITHOUT FURTHER CLINICAL AND CULTURE CORRELATION. SUSCEPTIBILITIES AVAILABLE ONLY ON REQUEST.     Note: Gram Stain Report Called to,Read Back By and Verified With: Meade Maw RN on 08/03/11 at 05:20 by Christie Nottingham   Report Status 08/04/2011 FINAL   Final   CULTURE, BLOOD (ROUTINE X 2)      Status: Normal (Preliminary result)   Collection Time   08/03/11  4:41 PM      Component Value Range Status Comment   Specimen Description BLOOD RIGHT HAND   Final    Special Requests BOTTLES DRAWN AEROBIC ONLY 5CC   Final    Culture  Setup Time [id number]   Final    Culture     Final    Value:        BLOOD CULTURE RECEIVED NO GROWTH TO DATE CULTURE WILL BE HELD FOR 5 DAYS BEFORE ISSUING A FINAL NEGATIVE REPORT   Report Status PENDING   Incomplete     Anti-infectives     Start     Dose/Rate Route Frequency Ordered Stop   08/07/11 0200   vancomycin (VANCOCIN) 1,500 mg in sodium chloride 0.9 % 500 mL IVPB        1,500 mg 250 mL/hr over 120 Minutes Intravenous Every 12 hours 08/06/11 1624     08/05/11 0600   metroNIDAZOLE (FLAGYL) IVPB 500 mg  Status:  Discontinued        500 mg 100 mL/hr over 60 Minutes Intravenous On call to O.R. 08/04/11 1445 08/04/11 1533   08/02/11 1400   vancomycin (VANCOCIN) 1,250 mg in sodium chloride 0.9 % 250 mL IVPB  Status:  Discontinued        1,250 mg 166.7 mL/hr over 90 Minutes Intravenous Every 12 hours 08/02/11 1203 08/06/11 1624   08/02/11 0345   ciprofloxacin (CIPRO) IVPB 400 mg  Status:  Discontinued        400 mg 200 mL/hr over 60 Minutes Intravenous 120 min pre-op 08/02/11 0340 08/02/11 0340   07/30/11 1400   metroNIDAZOLE (FLAGYL) tablet 500 mg        500 mg Oral 3 times per day 07/30/11 0829     07/30/11 1100   vancomycin (VANCOCIN) 750 mg in sodium chloride 0.9 % 150 mL IVPB  Status:  Discontinued        750 mg 150 mL/hr over 60 Minutes Intravenous Every 12 hours 07/30/11 1008 08/02/11 1155   07/30/11 0930   ciprofloxacin (CIPRO) tablet 500 mg        500 mg Oral 2 times daily 07/30/11 0829     07/29/11 1130   ciprofloxacin (CIPRO) IVPB 400 mg  Status:  Discontinued  400 mg 200 mL/hr over 60 Minutes Intravenous Every 12 hours 07/29/11 1029 07/30/11 0829   07/28/11 2300   ceFEPIme (MAXIPIME) 1 g in dextrose 5 % 50 mL IVPB  Status:   Discontinued        1 g 100 mL/hr over 30 Minutes Intravenous Every 12 hours 07/28/11 2230 07/29/11 1029   07/28/11 2245   vancomycin (VANCOCIN) IVPB 1000 mg/200 mL premix        1,000 mg 200 mL/hr over 60 Minutes Intravenous  Once 07/28/11 2230 07/29/11 0211   07/28/11 2245   metroNIDAZOLE (FLAGYL) IVPB 500 mg  Status:  Discontinued        500 mg 100 mL/hr over 60 Minutes Intravenous Every 8 hours 07/28/11 2230 07/30/11 0829         Assessment: 79yo F on D#9 IV Vancomycin for MRSE bacteremia. TTE shows no evidence of vegetation, but TTE is much less sensitive than TEE, so will still treat for ~6 weeks for high risk of endocarditis, per ID recs. Pt is also on D#9 of PO cipro and flagyl for abdominal wound infection - timing of fluoroquinolone spaced appropriately with multivalent cations in feeding supplement and ensure plus.   Vancomycin dose was increased on 2/3 to 1250mg  IV q12h, his current dose, for subtherapeutic trough value of 8.6. Vancomycin trough today at new steady state is 14.6, slightly under goal of 15-20.  Tm/24h 100.4, WBC 8.9, renal function stable, CLcr ~60.  Repeat BCx on 2/4 NGTD - will need to change PICC if positive.   Goal of Therapy:  Vancomycin trough level 15-20 mcg/ml for endocarditis coverage  Plan:  1. Increase vancomycin to 1500 mg IV q12 hours, starting with his 2am dose on 08/07/11 (D10 vanc on 2/8). If renal function remains stable, would recommend this dose upon discharge - anticipating ~6 weeks total vanc iv therapy.  2. Will follow renal function and clinical progress.  Wyline Copas [phone] 08/06/2011,4:27 PM

## 2011-08-06 NOTE — Progress Notes (Signed)
Clinical Social Worker met with CCS team to discuss dc date.  Possible dc date to SNF[facility] pending cultures are negative.  CSW updated SNF.  CSW to continue to follow and assist as needed.  Angelia Mould, MSW, [place] [phone]

## 2011-08-07 LAB — GLUCOSE, CAPILLARY
Glucose-Capillary: 153 mg/dL — ABNORMAL HIGH (ref 70–99)
Glucose-Capillary: 137 mg/dL — ABNORMAL HIGH (ref 70–99)

## 2011-08-07 LAB — BASIC METABOLIC PANEL
BUN: 13 mg/dL (ref 6–23)
CO2: 22 mEq/L (ref 19–32)
Chloride: 105 mEq/L (ref 96–112)
Creatinine, Ser: 0.44 mg/dL — ABNORMAL LOW (ref 0.50–1.10)
Glucose, Bld: 104 mg/dL — ABNORMAL HIGH (ref 70–99)

## 2011-08-07 MED ORDER — PARICALCITOL 5 MCG/ML IV SOLN
INTRAVENOUS | Status: AC
  Filled 2011-08-07: qty 1

## 2011-08-07 MED ORDER — FAT EMULSION 20 % IV EMUL
250.0000 mL | INTRAVENOUS | Status: DC
  Filled 2011-08-07: qty 250

## 2011-08-07 MED ORDER — VANCOMYCIN HCL 1000 MG IV SOLR: 1500.0000 mg | Freq: Two times a day (BID) | INTRAVENOUS | Status: AC

## 2011-08-07 MED ORDER — HEPARIN SOD (PORK) LOCK FLUSH 100 UNIT/ML IV SOLN
250.0000 [IU] | INTRAVENOUS | Status: AC | PRN
  Administered 2011-08-07: 500 [IU]

## 2011-08-07 MED ORDER — OXYCODONE HCL 5 MG PO TABS: 5.0000 mg | ORAL_TABLET | ORAL | Status: DC | PRN

## 2011-08-07 MED ORDER — M.V.I. ADULT IV INJ
INJECTION | INTRAVENOUS | Status: DC
  Filled 2011-08-07: qty 2000

## 2011-08-07 MED ORDER — VANCOMYCIN HCL 1000 MG IV SOLR: 1500.0000 mg | Freq: Two times a day (BID) | INTRAVENOUS | Status: DC

## 2011-08-07 MED ORDER — POTASSIUM CHLORIDE 20 MEQ/15ML (10%) PO LIQD
40.0000 meq | Freq: Once | ORAL | Status: AC
  Administered 2011-08-07: 40 meq via ORAL
  Filled 2011-08-07 (×2): qty 30

## 2011-08-07 NOTE — Progress Notes (Signed)
INFECTIOUS DISEASE PROGRESS NOTE  ID: [name] is a 78 y.o. female with  Active Problems:  * No active hospital problems. *   Subjective: C/o abd pain  Abtx:  Anti-infectives     Start     Dose/Rate Route Frequency Ordered Stop   08/07/11 0200   vancomycin (VANCOCIN) 1,500 mg in sodium chloride 0.9 % 500 mL IVPB        1,500 mg 250 mL/hr over 120 Minutes Intravenous Every 12 hours 08/06/11 1624     08/07/11 0000   sodium chloride 0.9 % SOLN 500 mL with vancomycin 1000 MG SOLR 1,500 mg  Status:  Discontinued        1,500 mg 250 mL/hr over 120 Minutes Intravenous Every 12 hours 08/07/11 0951 08/07/11    08/07/11 0000   sodium chloride 0.9 % SOLN 500 mL with vancomycin 1000 MG SOLR 1,500 mg        1,500 mg 250 mL/hr over 120 Minutes Intravenous Every 12 hours 08/07/11 1000 08/26/11 2359   08/05/11 0600   metroNIDAZOLE (FLAGYL) IVPB 500 mg  Status:  Discontinued        500 mg 100 mL/hr over 60 Minutes Intravenous On call to O.R. 08/04/11 1445 08/04/11 1533   08/02/11 1400   vancomycin (VANCOCIN) 1,250 mg in sodium chloride 0.9 % 250 mL IVPB  Status:  Discontinued        1,250 mg 166.7 mL/hr over 90 Minutes Intravenous Every 12 hours 08/02/11 1203 08/06/11 1624   08/02/11 0345   ciprofloxacin (CIPRO) IVPB 400 mg  Status:  Discontinued        400 mg 200 mL/hr over 60 Minutes Intravenous 120 min pre-op 08/02/11 0340 08/02/11 0340   07/30/11 1400   metroNIDAZOLE (FLAGYL) tablet 500 mg        500 mg Oral 3 times per day 07/30/11 0829     07/30/11 1100   vancomycin (VANCOCIN) 750 mg in sodium chloride 0.9 % 150 mL IVPB  Status:  Discontinued        750 mg 150 mL/hr over 60 Minutes Intravenous Every 12 hours 07/30/11 1008 08/02/11 1155   07/30/11 0930   ciprofloxacin (CIPRO) tablet 500 mg        500 mg Oral 2 times daily 07/30/11 0829     07/29/11 1130   ciprofloxacin (CIPRO) IVPB 400 mg  Status:  Discontinued        400 mg 200 mL/hr over 60 Minutes Intravenous Every 12  hours 07/29/11 1029 07/30/11 0829   07/28/11 2300   ceFEPIme (MAXIPIME) 1 g in dextrose 5 % 50 mL IVPB  Status:  Discontinued        1 g 100 mL/hr over 30 Minutes Intravenous Every 12 hours 07/28/11 2230 07/29/11 1029   07/28/11 2245   vancomycin (VANCOCIN) IVPB 1000 mg/200 mL premix        1,000 mg 200 mL/hr over 60 Minutes Intravenous  Once 07/28/11 2230 07/29/11 0211   07/28/11 2245   metroNIDAZOLE (FLAGYL) IVPB 500 mg  Status:  Discontinued        500 mg 100 mL/hr over 60 Minutes Intravenous Every 8 hours 07/28/11 2230 07/30/11 0829          Medications:  Scheduled:   . amLODipine  5 mg Oral Daily  . benazepril  20 mg Oral Daily  . ciprofloxacin  500 mg Oral BID  . cloNIDine  0.1 mg Transdermal Weekly  . darifenacin  15 mg  Oral Daily  . Ensure Plus  1 Can Oral Daily  . feeding supplement  1 Container Oral TID WC  . heparin  5,000 Units Subcutaneous Q8H  . insulin aspart  0-15 Units Subcutaneous Q4H  . magnesium sulfate 1 - 4 g bolus IVPB  2 g Intravenous Once  . metoprolol tartrate  12.5 mg Oral BID  . metroNIDAZOLE  500 mg Oral Q8H  . pantoprazole  40 mg Oral BID AC  . potassium chloride  40 mEq Oral Once  . QUEtiapine  25 mg Oral QHS  . vancomycin  1,500 mg Intravenous Q12H  . DISCONTD: vancomycin  1,250 mg Intravenous Q12H    Objective: Vital signs in last 24 hours: Temp:  [98.9 F (37.2 C)-100 F (37.8 C)] 100 F (37.8 C) (02/08 0532) Pulse Rate:  [101-110] 110  (02/08 0532) Resp:  [18-20] 18  (02/08 0532) BP: (121-154)/(56-79) 132/74 mmHg (02/08 0532) SpO2:  [96 %-97 %] 96 % (02/08 0532)   GI: soft, mild tenderness, non r/g, wounds are clean.   Lab Results  Basename 08/07/11 0500 08/06/11 0645  WBC -- 8.9  HGB -- 8.2*  HCT -- 24.9*  NA 135 133*  K 3.5 3.9  CL 105 104  CO2 22 22  BUN 13 13  CREATININE 0.44* 0.47*  GLU -- --   Liver Panel  Basename 08/06/11 0645  PROT 6.2  ALBUMIN 1.8*  AST 15  ALT 5  ALKPHOS 95  BILITOT 0.1*    BILIDIR --  IBILI --   Sedimentation Rate No results found for this basename: ESRSEDRATE in the last 72 hours C-Reactive Protein No results found for this basename: CRP:2 in the last 72 hours  Microbiology: Recent Results (from the past 240 hour(s))  CULTURE, BLOOD (ROUTINE X 2)     Status: Normal   Collection Time   07/28/11 11:30 PM      Component Value Range Status Comment   Specimen Description BLOOD RIGHT HAND   Final    Special Requests BOTTLES DRAWN AEROBIC ONLY 4CC   Final    Culture  Setup Time [id number]   Final    Culture     Final    Value: STAPHYLOCOCCUS SPECIES (COAGULASE NEGATIVE)     Note: SUSCEPTIBILITIES PERFORMED ON PREVIOUS CULTURE WITHIN THE LAST 5 DAYS.     Note: Gram Stain Report Called to,Read Back By and Verified With: PATTY MOSS 07/30/11 0900 BY SMITHERSJ   Report Status 08/01/2011 FINAL   Final   CULTURE, BLOOD (ROUTINE X 2)     Status: Normal   Collection Time   07/28/11 11:42 PM      Component Value Range Status Comment   Specimen Description BLOOD HAND LEFT   Final    Special Requests BOTTLES DRAWN AEROBIC ONLY 5CC   Final    Culture  Setup Time [id number]   Final    Culture     Final    Value: STAPHYLOCOCCUS SPECIES (COAGULASE NEGATIVE)     Note: RIFAMPIN AND GENTAMICIN SHOULD NOT BE USED AS SINGLE DRUGS FOR TREATMENT OF STAPH INFECTIONS.     Note: Gram Stain Report Called to,Read Back By and Verified With: PATTI MOSS 0740 07/30/2011 BY WOODB   Report Status 08/01/2011 FINAL   Final    Organism ID, Bacteria STAPHYLOCOCCUS SPECIES (COAGULASE NEGATIVE)   Final   WOUND CULTURE     Status: Normal   Collection Time   07/29/11  1:08 PM  Component Value Range Status Comment   Specimen Description WOUND ABDOMEN   Final    Special Requests NONE   Final    Gram Stain     Final    Value: RARE WBC PRESENT, PREDOMINANTLY PMN     NO SQUAMOUS EPITHELIAL CELLS SEEN     NO ORGANISMS SEEN   Culture NO GROWTH 3 DAYS   Final    Report Status 08/01/2011  FINAL   Final   MRSA PCR SCREENING     Status: Normal   Collection Time   07/29/11  8:56 PM      Component Value Range Status Comment   MRSA by PCR NEGATIVE  NEGATIVE  Final   CULTURE, BLOOD (ROUTINE X 2)     Status: Normal   Collection Time   07/31/11  5:31 PM      Component Value Range Status Comment   Specimen Description BLOOD RIGHT ARM   Final    Special Requests BOTTLES DRAWN AEROBIC ONLY 4.5CC   Final    Culture  Setup Time [id number]   Final    Culture     Final    Value: STAPHYLOCOCCUS SPECIES (COAGULASE NEGATIVE)     Note: THE SIGNIFICANCE OF ISOLATING THIS ORGANISM FROM A SINGLE VENIPUNCTURE CANNOT BE PREDICTED WITHOUT FURTHER CLINICAL AND CULTURE CORRELATION. SUSCEPTIBILITIES AVAILABLE ONLY ON REQUEST.     Note: Gram Stain Report Called to,Read Back By and Verified With: Meade Maw RN on 08/03/11 at 05:20 by Christie Nottingham   Report Status 08/04/2011 FINAL   Final   CULTURE, BLOOD (ROUTINE X 2)     Status: Normal (Preliminary result)   Collection Time   08/03/11  4:41 PM      Component Value Range Status Comment   Specimen Description BLOOD RIGHT HAND   Final    Special Requests BOTTLES DRAWN AEROBIC ONLY 5CC   Final    Culture  Setup Time [id number]   Final    Culture     Final    Value:        BLOOD CULTURE RECEIVED NO GROWTH TO DATE CULTURE WILL BE HELD FOR 5 DAYS BEFORE ISSUING A FINAL NEGATIVE REPORT   Report Status PENDING   Incomplete     Studies/Results: No results found.   Assessment/Plan: Abd abscess MRSE bacteremia Day 10/28 anbx. Her TTE was (-).  Repeat BCx 08-03-11 NGTD.  No change in cipro/flagyl. For placement today.   [name] Infectious Diseases [phone] 08/07/2011, 12:21 PM

## 2011-08-07 NOTE — Discharge Summary (Signed)
[name] Wickizer, MD, MPH, FACS Pager: [phone]  

## 2011-08-07 NOTE — Progress Notes (Signed)
Patient discharged to [facility] via EMS transportation. Paperwork sent via EMS. PICC line capped for transportation.

## 2011-08-07 NOTE — Progress Notes (Signed)
Clinical Social Worker submitted appropriate paperwork to [facility].  CSW to continue to follow and assist with dc.    Angelia Mould, MSW, [place] [phone]

## 2011-08-07 NOTE — Discharge Summary (Signed)
Patient ID: [name] MRN: [id number] DOB/AGE: 01/22/1932 78 y.o.  Admit date: 07/28/2011 Discharge date: 08/07/2011  Procedures:  None  Consults: ID  Reason for Admission:  This is a 78 yo female with a complex history who was seen in our office the day prior to admission due to abdominal wall drainage.  This area was opened up, however, upon return to SNF, she developed a fever of 104.  She was brought to the [facility] where she was admitted.  Admission Diagnoses: 1. Abdominal wall infection Patient Active Problem List  Diagnoses  . DIABETES MELLITUS, TYPE II  . HYPERLIPIDEMIA  . Obesity, unspecified  . DEPRESSION  . HYPERTENSION  . DIVERTICULOSIS OF COLON  . ACUTE CYSTITIS  . HEMATOMA  . Anemia  . Hyponatremia  . Perforated prepyloric gastric ulcer s/p antrectomy with Billroth II reconstruction  . Ulcer, colon  . Acute renal failure  . Confusion  . Metabolic acidosis  . Esophagitis, erosive  . 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 Wound healing, delayed    Hospital Course: She was admitted and initially placed on IV Cipro and Flagyl for abdominal wall infection.  However, due to her high fevers, it was felt the patient may be septic secondary to her PICC line.  This had actually been removed prior to admission.  She had blood cultures that revealed coagulase negative staph.  She was switched to IV vancomycin and her cipro and flagyl were changed to po to continue treating her abdominal wound.  Infectious disease was consulted for recommendations on length of abx treatment.  4 weeks of therapy was recommended.  A repeat set of blood cultures were obtained which still grew out the same bacteria.  Because of this, it was recommended the patient get a TEE to rule out endocarditis.  Because of recent GI surgery, cardiology was not comfortable with doing a TEE and recommended a 2-D ECHO.  This did not reveal any vegetation.  A third set of blood cultures was  obtained which is negative for growth at 96 hours.  It is currently felt the patient has cleared her bacteremia and is stable for discharge.  There patient has otherwise remained stable on TNA and a regular diet for comfort.  She has eaten some, but not great.  She did have some diarrhea, but minimal this stay.  Discharge Diagnoses:  Abdominal wound infection Coagulase negative staph bacteremia Patient Active Problem List  Diagnoses  . DIABETES MELLITUS, TYPE II  . HYPERLIPIDEMIA  . Obesity, unspecified  . DEPRESSION  . HYPERTENSION  . DIVERTICULOSIS OF COLON  . ACUTE CYSTITIS  . HEMATOMA  . Anemia  . Hyponatremia  . Perforated prepyloric gastric ulcer s/p antrectomy with Billroth II reconstruction  . Ulcer, colon  . Acute renal failure  . Confusion  . Metabolic acidosis  . Esophagitis, erosive  . 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 Wound healing, delayed     Discharge Medications: Medication List  As of 08/07/2011 10:02 AM   TAKE these medications         acetaminophen 500 MG tablet   Commonly known as: TYLENOL   Take 1 tablet (500 mg total) by mouth 3 (three) times daily as needed for pain.      amLODipine 5 MG tablet   Commonly known as: NORVASC   Take 1 tablet (5 mg total) by mouth daily.      benazepril 20 MG tablet   Commonly  known as: LOTENSIN   Take 20 mg by mouth daily.      butalbital-acetaminophen-caffeine 50-325-40 MG per tablet   Commonly known as: FIORICET, ESGIC   Take 1-2 tablets by mouth every 8 (eight) hours as needed for headache.      calcium-vitamin D 500-200 MG-UNIT per tablet   Commonly known as: OSCAL WITH D   Take 1 tablet by mouth 2 (two) times daily.      cloNIDine 0.1 mg/24hr patch   Commonly known as: CATAPRES - Dosed in mg/24 hr   Place 1 patch onto the skin once a week.      darifenacin 15 MG 24 hr tablet   Commonly known as: ENABLEX   Take 15 mg by mouth daily.      Ensure Plus Liqd   Take 1  Can by mouth daily.      feeding supplement Liqd   Take 1 Container by mouth 3 (three) times daily with meals.      fat emulsion 20 % infusion   Inject 240 mLs into the vein continuous.      IMODIUM PO   Take 2 mg by mouth every 4 (four) hours as needed. For diarrhea      insulin aspart 100 UNIT/ML injection   Commonly known as: novoLOG   Inject 0-9 Units into the skin 3 (three) times daily before meals. Sliding scale-give 3 units sq for CBG >150      metFORMIN 1000 MG tablet   Commonly known as: GLUCOPHAGE   Take 1,000 mg by mouth daily with breakfast.      metoprolol tartrate 12.5 mg Tabs   Commonly known as: LOPRESSOR   Take 12.5 mg by mouth 2 (two) times daily.      multivitamin tablet   Take 1 tablet by mouth daily.      ondansetron 4 MG tablet   Commonly known as: ZOFRAN   Take 4 mg by mouth every 8 (eight) hours as needed. For nausea        oxyCODONE 5 MG/5ML solution   Commonly known as: ROXICODONE   Take 5 mg by mouth every 4 (four) hours as needed. For pain      oxyCODONE 5 MG immediate release tablet   Commonly known as: Oxy IR/ROXICODONE   Take 1 tablet (5 mg total) by mouth every 4 (four) hours as needed.      pantoprazole 40 MG tablet   Commonly known as: PROTONIX   Take 40 mg by mouth 2 (two) times daily before a meal.      QUEtiapine 25 MG tablet   Commonly known as: SEROQUEL   Take 25 mg by mouth at bedtime.      sodium chloride 0.9 % SOLN 500 mL with vancomycin 1000 MG SOLR 1,500 mg   Inject 1,500 mg into the vein every 12 (twelve) hours.            Discharge Instructions: Follow-up Information    Follow up with [name], MD.      Follow up with Ardeth Sportsman., MD on 08/18/2011. (be there at 8:45am)    Contact information:   [facility] [address] [place] [address] [phone]          Signed: Letha Cape 08/07/2011, 10:02 AM

## 2011-08-07 NOTE — Progress Notes (Signed)
Patient examined and I agree with the assessment and plan  [name], MD, MPH, FACS Pager: [phone]  08/07/2011 1:26 PM

## 2011-08-07 NOTE — Progress Notes (Signed)
  Subjective: Doing well, no complaints  Objective: Vital signs in last 24 hours: Temp:  [98.9 F (37.2 C)-100 F (37.8 C)] 100 F (37.8 C) (02/08 0532) Pulse Rate:  [96-110] 110  (02/08 0532) Resp:  [18-20] 18  (02/08 0532) BP: (121-154)/(56-79) 132/74 mmHg (02/08 0532) SpO2:  [96 %-97 %] 96 % (02/08 0532) Last BM Date: 08/06/11  Intake/Output this shift:    Physical Exam: BP 132/74  Pulse 110  Temp(Src) 100 F (37.8 C) (Oral)  Resp 18  Ht 5' 6.5" (1.689 m)  Wt 162 lb (73.483 kg)  BMI 25.76 kg/m2  SpO2 96% Gen: NAD CV: RRR Resp: CTABL Abdomen: soft, NT, G-tube intact, Upper midline wounds still some minimal drainage, but no erythema or tenderness. Ext: No tenderness or edema  Labs: CBC  Basename 08/06/11 0645  WBC 8.9  HGB 8.2*  HCT 24.9*  PLT 327   BMET  Basename 08/07/11 0500 08/06/11 0645  NA 135 133*  K 3.5 3.9  CL 105 104  CO2 22 22  GLUCOSE 104* 122*  BUN 13 13  CREATININE 0.44* 0.47*  CALCIUM 8.4 8.4   LFT  Basename 08/06/11 0645  PROT 6.2  ALBUMIN 1.8*  AST 15  ALT 5  ALKPHOS 95  BILITOT 0.1*  BILIDIR --  IBILI --  LIPASE --   PT/INR No results found for this basename: LABPROT:2,INR:2 in the last 72 hours ABG No results found for this basename: PHART:2,PCO2:2,PO2:2,HCO3:2 in the last 72 hours  Studies/Results: No results found.  Assessment/Plan: 1. Bacteremia (coag neg staph) likely secondary to PICC  Plan:  1. Greatly appreciate infectious disease's assistance with this patient's care.  2. Cardiology not comfortable with TEE due to recent gastric surgery. TTE shows no gross vegetations. 3. Continue current abx therapy, repeat Blood Cx 2/4-No growth so far. Expect final results today, may consider d/c to [facility] today with 6 weeks total IV Vanc, likely 2 weeks cipro/flagyl for abdominal wound 4. Cont wound care to abd wound.      LOS: 10 days    Cane Dubray MD 08/07/2011 7:47 AM

## 2011-08-07 NOTE — Progress Notes (Signed)
PARENTERAL NUTRITION CONSULT NOTE - FOLLOW UP   Pharmacy Consult for TPN Indication: Short gut  Allergies  Allergen Reactions  . Other Swelling    Pinto beans cause swelling and hives  . Penicillins Swelling    Patient Measurements: Height: 5' 6.5" (168.9 cm) Weight: 162 lb (73.483 kg) IBW/kg (Calculated) : 60.45  Usual Weight: 73.5  Vital Signs: Temp: 100 F (37.8 C) (02/08 0532) Temp src: Oral (02/08 0532) BP: 132/74 mmHg (02/08 0532) Pulse Rate: 110  (02/08 0532) Intake/Output from previous day: 02/07 0701 - 02/08 0700 In: 2346.4 [P.O.:480; I.V.:520.8; IV Piggyback:50; TPN:1295.6] Out: -  Intake/Output from this shift:    Labs:  [facility] 08/06/11 0645  WBC 8.9  HGB 8.2*  HCT 24.9*  PLT 327  APTT --  INR --     Basename 08/07/11 0500 08/06/11 0645 08/05/11 0550  NA 135 133* 132*  K 3.5 3.9 3.6  CL 105 104 102  CO2 22 22 22   GLUCOSE 104* 122* 134*  BUN 13 13 12   CREATININE 0.44* 0.47* 0.48*  LABCREA -- -- --  CREAT24HRUR -- -- --  CALCIUM 8.4 8.4 8.6  MG 1.6 1.5 --  PHOS 3.8 4.6 --  PROT -- 6.2 --  ALBUMIN -- 1.8* --  AST -- 15 --  ALT -- 5 --  ALKPHOS -- 95 --  BILITOT -- 0.1* --  BILIDIR -- -- --  IBILI -- -- --  PREALBUMIN -- -- --  TRIG -- -- --  CHOLHDL -- -- --  CHOL -- -- --   Estimated Creatinine Clearance: 61.1 ml/min (by C-G formula based on Cr of 0.44).    Basename 08/07/11 0738 08/07/11 0336 08/06/11 2341  GLUCAP 153* 111* 156*    Insulin Requirements in the past 24 hours:  12 units SSI with 65 units insulin in TNA  Current Nutrition:  PO: carb modified diet started 1/30. Clinimix E 5/15 at 80 ml/hr and lipids at 10 ml/hr MWF: provides weekly average of 1569 kcl and 96  protein per day.  Assessment: [name] admitted from [facility] where she is on chronic TPN and takes POs only for pleasure.  History of erosive esophagitis with perf pyloric ulcer s/p lap 11/7. s/p gastrostomy, gastrectomy, choleycystectomy. Last d/c  07/06/11. 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  Endo: DM.CBGs slightly above goal of <150 mg/dl. Lytes: Na remains low, chronic; K=3.5, phos better today (no longer trendin up). Corr Ca=10.2.  Mag low end Nl.  Renal: stable  Cards: VSS. HTN on Norvasc 5mg , benazepril 20mg , Clonidine 0.1 mg patch  Hepatobil: LFTs WNL, Trigl 75. Prealbumin improving slowly 6>7 Neuro: Seroquel  ID: Small wound/abscess on abdomen. AF, WBC stable at 8.1.  Cultures negative. Patient has CNS bacteremia presumed due to PICC line infection on Vancomycin.  TTE negative Prophx: SQ heparin, po PPI  Nutritional Goals:  1500-1600 kCal, 90-105 grams of protein per day  Plan:  1.  Continue clinimix E5/15 at 80 ml/hr 2.  Continue lipids, MVI, TE, MWF only due to national shortage 3.  KCL soln x 1 po, on other PO meds 4. No labs in am  Juliette Alcide, PharmD, BCPS.  Pager: [phone] 08/07/2011 8:36 AM

## 2011-08-09 LAB — CULTURE, BLOOD (ROUTINE X 2)
Culture  Setup Time: 201302042150
Culture: NO GROWTH

## 2011-08-16 ENCOUNTER — Encounter (HOSPITAL_COMMUNITY): Payer: Self-pay | Admitting: *Deleted

## 2011-08-16 ENCOUNTER — Observation Stay (HOSPITAL_COMMUNITY)
Admission: EM | Admit: 2011-08-16 | Discharge: 2011-08-19 | Disposition: A | Discharge status: Skilled Nursing Facility | Payer: Medicare HMO | Attending: Internal Medicine | Admitting: Internal Medicine

## 2011-08-16 DIAGNOSIS — Y9210 Unspecified residential institution as the place of occurrence of the external cause: Secondary | ICD-10-CM | POA: Insufficient documentation

## 2011-08-16 DIAGNOSIS — T148XXA Other injury of unspecified body region, initial encounter: Secondary | ICD-10-CM

## 2011-08-16 DIAGNOSIS — E872 Acidosis, unspecified: Secondary | ICD-10-CM

## 2011-08-16 DIAGNOSIS — R410 Disorientation, unspecified: Secondary | ICD-10-CM

## 2011-08-16 DIAGNOSIS — A0472 Enterocolitis due to Clostridium difficile, not specified as recurrent: Secondary | ICD-10-CM

## 2011-08-16 DIAGNOSIS — K633 Ulcer of intestine: Secondary | ICD-10-CM

## 2011-08-16 DIAGNOSIS — Y849 Medical procedure, unspecified as the cause of abnormal reaction of the patient, or of later complication, without mention of misadventure at the time of the procedure: Secondary | ICD-10-CM | POA: Insufficient documentation

## 2011-08-16 DIAGNOSIS — T148XXD Other injury of unspecified body region, subsequent encounter: Secondary | ICD-10-CM

## 2011-08-16 DIAGNOSIS — D649 Anemia, unspecified: Secondary | ICD-10-CM

## 2011-08-16 DIAGNOSIS — E785 Hyperlipidemia, unspecified: Secondary | ICD-10-CM

## 2011-08-16 DIAGNOSIS — N3000 Acute cystitis without hematuria: Secondary | ICD-10-CM

## 2011-08-16 DIAGNOSIS — R630 Anorexia: Secondary | ICD-10-CM | POA: Insufficient documentation

## 2011-08-16 DIAGNOSIS — K316 Fistula of stomach and duodenum: Secondary | ICD-10-CM

## 2011-08-16 DIAGNOSIS — E119 Type 2 diabetes mellitus without complications: Secondary | ICD-10-CM

## 2011-08-16 DIAGNOSIS — K2210 Ulcer of esophagus without bleeding: Secondary | ICD-10-CM

## 2011-08-16 DIAGNOSIS — N179 Acute kidney failure, unspecified: Secondary | ICD-10-CM

## 2011-08-16 DIAGNOSIS — E669 Obesity, unspecified: Secondary | ICD-10-CM

## 2011-08-16 DIAGNOSIS — L0291 Cutaneous abscess, unspecified: Secondary | ICD-10-CM

## 2011-08-16 DIAGNOSIS — Z789 Other specified health status: Secondary | ICD-10-CM

## 2011-08-16 DIAGNOSIS — K5730 Diverticulosis of large intestine without perforation or abscess without bleeding: Secondary | ICD-10-CM

## 2011-08-16 DIAGNOSIS — I10 Essential (primary) hypertension: Secondary | ICD-10-CM

## 2011-08-16 DIAGNOSIS — T85898A Other specified complication of other internal prosthetic devices, implants and grafts, initial encounter: Principal | ICD-10-CM | POA: Insufficient documentation

## 2011-08-16 DIAGNOSIS — F329 Major depressive disorder, single episode, unspecified: Secondary | ICD-10-CM

## 2011-08-16 DIAGNOSIS — E871 Hypo-osmolality and hyponatremia: Secondary | ICD-10-CM

## 2011-08-16 DIAGNOSIS — B9681 Helicobacter pylori [H. pylori] as the cause of diseases classified elsewhere: Secondary | ICD-10-CM

## 2011-08-16 DIAGNOSIS — L02219 Cutaneous abscess of trunk, unspecified: Secondary | ICD-10-CM | POA: Insufficient documentation

## 2011-08-16 DIAGNOSIS — F3289 Other specified depressive episodes: Secondary | ICD-10-CM

## 2011-08-16 HISTORY — DX: Peptic ulcer, site unspecified, unspecified as acute or chronic, without hemorrhage or perforation: K27.9

## 2011-08-16 HISTORY — DX: Insomnia, unspecified: G47.00

## 2011-08-16 LAB — BASIC METABOLIC PANEL
CO2: 22 mEq/L (ref 19–32)
Calcium: 9.7 mg/dL (ref 8.4–10.5)
Creatinine, Ser: 0.59 mg/dL (ref 0.50–1.10)
GFR calc Af Amer: >90 mL/min (ref >90)
GFR calc non Af Amer: 86 mL/min — ABNORMAL LOW (ref >90)
Sodium: 132 mEq/L — ABNORMAL LOW (ref 135–145)

## 2011-08-16 LAB — GLUCOSE, CAPILLARY: Glucose-Capillary: 122 mg/dL — ABNORMAL HIGH (ref 70–99)

## 2011-08-16 LAB — CBC
MCH: 27.2 pg (ref 26.0–34.0)
MCHC: 33.4 g/dL (ref 30.0–36.0)
Platelets: 273 10*3/uL (ref 150–400)
RBC: 3.89 MIL/uL (ref 3.87–5.11)
RDW: 16.7 % — ABNORMAL HIGH (ref 11.5–15.5)

## 2011-08-16 MED ORDER — ACETAMINOPHEN 500 MG PO TABS
500.0000 mg | ORAL_TABLET | Freq: Three times a day (TID) | ORAL | Status: DC | PRN
  Filled 2011-08-16: qty 1

## 2011-08-16 MED ORDER — OXYCODONE HCL 5 MG/5ML PO SOLN: 5.0000 mg | ORAL | Status: DC | PRN

## 2011-08-16 MED ORDER — OXYCODONE HCL 5 MG PO TABS
5.0000 mg | ORAL_TABLET | ORAL | Status: DC | PRN
  Administered 2011-08-17 – 2011-08-19 (×5): 5 mg via ORAL
  Filled 2011-08-16 (×5): qty 1

## 2011-08-16 MED ORDER — ACETAMINOPHEN 325 MG PO TABS
650.0000 mg | ORAL_TABLET | Freq: Once | ORAL | Status: AC
  Administered 2011-08-16: 650 mg via ORAL
  Filled 2011-08-16: qty 2

## 2011-08-16 MED ORDER — TRAZODONE 25 MG HALF TABLET
25.0000 mg | ORAL_TABLET | Freq: Every evening | ORAL | Status: DC | PRN
  Filled 2011-08-16: qty 1

## 2011-08-16 MED ORDER — DARIFENACIN HYDROBROMIDE ER 15 MG PO TB24
15.0000 mg | ORAL_TABLET | Freq: Every day | ORAL | Status: DC
  Administered 2011-08-17 – 2011-08-19 (×3): 15 mg via ORAL
  Filled 2011-08-16 (×3): qty 1

## 2011-08-16 MED ORDER — BENAZEPRIL HCL 20 MG PO TABS: 20.0000 mg | ORAL_TABLET | Freq: Every day | ORAL | Status: DC

## 2011-08-16 MED ORDER — ENSURE CLINICAL ST REVIGOR PO LIQD: 237.0000 mL | Freq: Three times a day (TID) | ORAL | Status: DC

## 2011-08-16 MED ORDER — VANCOMYCIN HCL 1000 MG IV SOLR
1750.0000 mg | Freq: Once | INTRAVENOUS | Status: AC
  Administered 2011-08-16: 1750 mg via INTRAVENOUS
  Filled 2011-08-16 (×2): qty 1750

## 2011-08-16 MED ORDER — DARIFENACIN HYDROBROMIDE ER 15 MG PO TB24
15.0000 mg | ORAL_TABLET | Freq: Once | ORAL | Status: AC
  Administered 2011-08-16: 15 mg via ORAL
  Filled 2011-08-16: qty 1

## 2011-08-16 MED ORDER — SACCHAROMYCES BOULARDII 250 MG PO CAPS
250.0000 mg | ORAL_CAPSULE | Freq: Once | ORAL | Status: DC
  Filled 2011-08-16: qty 1

## 2011-08-16 MED ORDER — HYDROCODONE-ACETAMINOPHEN 5-325 MG PO TABS
1.0000 | ORAL_TABLET | Freq: Once | ORAL | Status: AC
  Administered 2011-08-16: 1 via ORAL
  Filled 2011-08-16: qty 1

## 2011-08-16 MED ORDER — INSULIN ASPART 100 UNIT/ML ~~LOC~~ SOLN
0.0000 [IU] | Freq: Three times a day (TID) | SUBCUTANEOUS | Status: DC
  Filled 2011-08-16: qty 3

## 2011-08-16 MED ORDER — SACCHAROMYCES BOULARDII 250 MG PO CAPS
250.0000 mg | ORAL_CAPSULE | Freq: Two times a day (BID) | ORAL | Status: DC
  Administered 2011-08-16: 250 mg via ORAL
  Filled 2011-08-16: qty 1

## 2011-08-16 MED ORDER — ONDANSETRON HCL 4 MG PO TABS
4.0000 mg | ORAL_TABLET | Freq: Three times a day (TID) | ORAL | Status: DC | PRN
  Administered 2011-08-17: 4 mg via ORAL
  Filled 2011-08-16: qty 1

## 2011-08-16 MED ORDER — METOPROLOL TARTRATE 12.5 MG HALF TABLET
12.5000 mg | ORAL_TABLET | Freq: Two times a day (BID) | ORAL | Status: DC
  Administered 2011-08-16: 12.5 mg via ORAL
  Filled 2011-08-16 (×2): qty 1

## 2011-08-16 MED ORDER — ENSURE CLINICAL ST REVIGOR PO LIQD
237.0000 mL | Freq: Once | ORAL | Status: AC
  Administered 2011-08-16: 237 mL via ORAL
  Filled 2011-08-16: qty 237

## 2011-08-16 MED ORDER — BUTALBITAL-APAP-CAFFEINE 50-325-40 MG PO TABS
2.0000 | ORAL_TABLET | Freq: Three times a day (TID) | ORAL | Status: DC | PRN
  Administered 2011-08-19: 2 via ORAL
  Filled 2011-08-16 (×2): qty 2

## 2011-08-16 MED ORDER — ENSURE PLUS PO LIQD
1.0000 | Freq: Every day | ORAL | Status: DC
  Administered 2011-08-16: 1 via ORAL
  Administered 2011-08-18: 09:00:00 via ORAL
  Administered 2011-08-19: 1 via ORAL
  Filled 2011-08-16 (×5): qty 237

## 2011-08-16 MED ORDER — ONDANSETRON HCL 4 MG/2ML IJ SOLN: 4.0000 mg | Freq: Three times a day (TID) | INTRAMUSCULAR | Status: AC | PRN

## 2011-08-16 MED ORDER — VANCOMYCIN HCL 1000 MG IV SOLR
1500.0000 mg | Freq: Two times a day (BID) | INTRAVENOUS | Status: DC
  Administered 2011-08-16: 1500 mg via INTRAVENOUS
  Filled 2011-08-16 (×3): qty 1500

## 2011-08-16 MED ORDER — METOPROLOL TARTRATE 12.5 MG HALF TABLET
12.5000 mg | ORAL_TABLET | Freq: Two times a day (BID) | ORAL | Status: DC
  Administered 2011-08-16 – 2011-08-19 (×6): 12.5 mg via ORAL
  Filled 2011-08-16 (×7): qty 1

## 2011-08-16 MED ORDER — QUETIAPINE FUMARATE 25 MG PO TABS
25.0000 mg | ORAL_TABLET | Freq: Every day | ORAL | Status: DC
  Filled 2011-08-16: qty 1

## 2011-08-16 MED ORDER — AMLODIPINE BESYLATE 5 MG PO TABS
5.0000 mg | ORAL_TABLET | Freq: Every day | ORAL | Status: DC
  Administered 2011-08-17 – 2011-08-19 (×3): 5 mg via ORAL
  Filled 2011-08-16 (×3): qty 1

## 2011-08-16 MED ORDER — ADULT MULTIVITAMIN W/MINERALS CH
1.0000 | ORAL_TABLET | Freq: Every day | ORAL | Status: DC
  Administered 2011-08-16 – 2011-08-19 (×4): 1 via ORAL
  Filled 2011-08-16 (×4): qty 1

## 2011-08-16 MED ORDER — SACCHAROMYCES BOULARDII 250 MG PO CAPS
250.0000 mg | ORAL_CAPSULE | Freq: Two times a day (BID) | ORAL | Status: DC
  Administered 2011-08-16 – 2011-08-19 (×6): 250 mg via ORAL
  Filled 2011-08-16 (×7): qty 1

## 2011-08-16 MED ORDER — BENAZEPRIL HCL 20 MG PO TABS
20.0000 mg | ORAL_TABLET | Freq: Once | ORAL | Status: AC
  Administered 2011-08-16: 20 mg via ORAL
  Filled 2011-08-16: qty 1

## 2011-08-16 MED ORDER — METFORMIN HCL 500 MG PO TABS
1000.0000 mg | ORAL_TABLET | Freq: Once | ORAL | Status: AC
  Administered 2011-08-16: 500 mg via ORAL
  Filled 2011-08-16: qty 2

## 2011-08-16 MED ORDER — CALCIUM CARBONATE-VITAMIN D 500-200 MG-UNIT PO TABS
1.0000 | ORAL_TABLET | Freq: Every day | ORAL | Status: DC
  Administered 2011-08-16 – 2011-08-19 (×4): 1 via ORAL
  Filled 2011-08-16 (×4): qty 1

## 2011-08-16 MED ORDER — CLONIDINE HCL 0.1 MG/24HR TD PTWK
0.1000 mg | MEDICATED_PATCH | TRANSDERMAL | Status: DC
  Administered 2011-08-16: 0.1 mg via TRANSDERMAL
  Filled 2011-08-16: qty 1

## 2011-08-16 MED ORDER — METFORMIN HCL 500 MG PO TABS
1000.0000 mg | ORAL_TABLET | Freq: Every day | ORAL | Status: DC
  Administered 2011-08-17 – 2011-08-19 (×3): 1000 mg via ORAL
  Filled 2011-08-16 (×4): qty 2

## 2011-08-16 MED ORDER — ENSURE CLINICAL ST REVIGOR PO LIQD
237.0000 mL | Freq: Once | ORAL | Status: AC
  Administered 2011-08-16: 11:00:00 via ORAL
  Filled 2011-08-16: qty 237

## 2011-08-16 MED ORDER — METFORMIN HCL 500 MG PO TABS: 1000.0000 mg | ORAL_TABLET | Freq: Every day | ORAL | Status: DC

## 2011-08-16 MED ORDER — SODIUM CHLORIDE 0.9 % IV SOLN: INTRAVENOUS | Status: AC

## 2011-08-16 MED ORDER — QUETIAPINE FUMARATE 25 MG PO TABS
25.0000 mg | ORAL_TABLET | Freq: Every day | ORAL | Status: DC
  Administered 2011-08-16 – 2011-08-18 (×3): 25 mg via ORAL
  Filled 2011-08-16 (×4): qty 1

## 2011-08-16 MED ORDER — VANCOMYCIN HCL 1000 MG IV SOLR
1750.0000 mg | Freq: Once | INTRAVENOUS | Status: AC
  Administered 2011-08-16: 1750 mg via INTRAVENOUS
  Filled 2011-08-16: qty 1750

## 2011-08-16 MED ORDER — PANTOPRAZOLE SODIUM 40 MG PO TBEC
40.0000 mg | DELAYED_RELEASE_TABLET | Freq: Once | ORAL | Status: AC
  Administered 2011-08-16: 40 mg via ORAL
  Filled 2011-08-16: qty 1

## 2011-08-16 MED ORDER — ONDANSETRON 4 MG PO TBDP
4.0000 mg | ORAL_TABLET | Freq: Once | ORAL | Status: AC
  Administered 2011-08-16: 4 mg via ORAL
  Filled 2011-08-16: qty 1

## 2011-08-16 MED ORDER — DARIFENACIN HYDROBROMIDE ER 15 MG PO TB24: 15.0000 mg | ORAL_TABLET | Freq: Every day | ORAL | Status: DC

## 2011-08-16 MED ORDER — PANTOPRAZOLE SODIUM 40 MG PO TBEC
40.0000 mg | DELAYED_RELEASE_TABLET | Freq: Two times a day (BID) | ORAL | Status: DC
  Administered 2011-08-17 – 2011-08-19 (×5): 40 mg via ORAL
  Filled 2011-08-16 (×6): qty 1

## 2011-08-16 MED ORDER — BENAZEPRIL HCL 20 MG PO TABS
20.0000 mg | ORAL_TABLET | Freq: Every day | ORAL | Status: DC
  Administered 2011-08-16 – 2011-08-19 (×4): 20 mg via ORAL
  Filled 2011-08-16 (×4): qty 1

## 2011-08-16 MED ORDER — AMLODIPINE BESYLATE 5 MG PO TABS
5.0000 mg | ORAL_TABLET | Freq: Once | ORAL | Status: AC
  Administered 2011-08-16: 5 mg via ORAL
  Filled 2011-08-16: qty 1

## 2011-08-16 MED ORDER — SERTRALINE HCL 25 MG PO TABS
25.0000 mg | ORAL_TABLET | Freq: Every day | ORAL | Status: DC
  Administered 2011-08-17 – 2011-08-19 (×3): 25 mg via ORAL
  Filled 2011-08-16 (×3): qty 1

## 2011-08-16 NOTE — ED Notes (Signed)
IV team contacted for IV access, will come to start IV.

## 2011-08-16 NOTE — ED Notes (Signed)
Spoke with Molly Maduro, RN at [facility].  States pt gets 1750 mg of Vancomycin in PICC line twice a day-at 06:00 and 18:00, last dose was last night at 18:00.

## 2011-08-16 NOTE — ED Provider Notes (Signed)
BP 140/73  Pulse 110  Temp(Src) 99.1 F (37.3 C) (Oral)  Resp 18  SpO2 96%   Home medications ordered.  Forbes Cellar, MD 08/16/11 [phone]

## 2011-08-16 NOTE — ED Notes (Signed)
Patient vomited clear liquid, and had an episode of diarrhea. Patient cleaned and linens changed. Patient resting comfortably.

## 2011-08-16 NOTE — ED Notes (Signed)
Nursing home called to ask pt CBG is checked frequently, states pt's sugar bottoms out easily.

## 2011-08-16 NOTE — ED Notes (Signed)
Verified w/Kellie, IV Team RN, that [employer], IV Team RN, will is planning to perform PICC line placement this afternoon after 1400.

## 2011-08-16 NOTE — ED Provider Notes (Signed)
History     CSN: [id number]  Arrival date & time 08/16/11  0455   First MD Initiated Contact with Patient 08/16/11 [phone]      Chief Complaint  Patient presents with  . Vascular Access Problem    (Consider location/radiation/quality/duration/timing/severity/associated sxs/prior treatment) HPI  Past Medical History  Diagnosis Date  . Hypertension   . Reflux   . Right elbow pain     OTIF  . Dementia   . Depression   . Osteoarthritis   . Pancreatitis 11/2007    HOP  . Angiomyolipoma of kidney     right  . Ulcerative esophagitis 12/05/2007    hx elevated gastrin, severe on EGD by Dr Jena Gauss , h pylori negative  . Hiatal hernia   . S/P colonoscopy 2009    pt reports normal by Dr Lovell Sheehan  . Diabetes mellitus   . Anemia   . Hyponatremia   . Cellulitis   . Esophagitis   . Peptic ulcer   . Insomnia     Past Surgical History  Procedure Date  . Orif right hip 1999    [facility]  . Umbilical hernia repair 98 months old    [place]  . Esophagogastroduodenoscopy 01/28/2011    Procedure: ESOPHAGOGASTRODUODENOSCOPY (EGD);  Surgeon: Arlyce Harman, MD;  Location: [facility]ITE;  Service: Endoscopy;  Laterality: N/A;  . Colonoscopy 01/28/2011    Procedure: COLONOSCOPY;  Surgeon: Arlyce Harman, MD;  Location: [facility]ITE;  Service: Endoscopy;  Laterality: N/A;  . Laparotomy 02/04/2011    Procedure: EXPLORATORY LAPAROTOMY;  Surgeon: Fabio Bering;  Location: [facility] ORS;  Service: General;  Laterality: N/A;  . Laparotomy 05/07/2011    Procedure: EXPLORATORY LAPAROTOMY;  Surgeon: Ardeth Sportsman, MD;  Location: [facility] OR;  Service: General;  Laterality: N/A;  lysis of adhesions  . Gastrostomy 05/07/2011    Procedure: GASTROSTOMY;  Surgeon: Ardeth Sportsman, MD;  Location: [facility] OR;  Service: General;  Laterality: N/A;  g-tube / j -tube placement  . Gastrectomy 05/07/2011    Procedure: GASTRECTOMY;  Surgeon: Ardeth Sportsman, MD;  Location: [facility] OR;  Service: General;  Laterality: N/A;  Partial gastrectomy  .  Cholecystectomy 05/07/2011    Procedure: CHOLECYSTECTOMY;  Surgeon: Ardeth Sportsman, MD;  Location: [facility] OR;  Service: General;  Laterality: N/A;  open    Family History  Problem Relation Age of Onset  . Cancer Mother     pelvic     History  Substance Use Topics  . Smoking status: Never Smoker   . Smokeless tobacco: Current User    Types: Chew  . Alcohol Use: No     Hx of Alcohol dependecy     OB History    Grav Para Term Preterm Abortions TAB SAB Ect Mult Living                  Review of Systems  Allergies  Other and Penicillins  Home Medications   Current Outpatient Rx  Name Route Sig Dispense Refill  . ACETAMINOPHEN 500 MG PO TABS Oral Take 500 mg by mouth daily as needed. For pain    . AMLODIPINE BESYLATE 5 MG PO TABS Oral Take 5 mg by mouth daily.    Marland Kitchen BENAZEPRIL HCL 20 MG PO TABS Oral Take 20 mg by mouth daily.     Marland Kitchen BUTALBITAL-APAP-CAFFEINE 50-325-40 MG PO TABS Oral Take 2 tablets by mouth every 8 (eight) hours as needed. For headache    . CALCIUM  CARBONATE-VITAMIN D 500-200 MG-UNIT PO TABS Oral Take 1 tablet by mouth daily.     Marland Kitchen CLONIDINE HCL 0.1 MG/24HR TD PTWK Transdermal Place 1 patch onto the skin once a week. Change on Mondays    . DARIFENACIN HYDROBROMIDE ER 15 MG PO TB24 Oral Take 15 mg by mouth daily.     Marland Kitchen ENSURE PLUS PO LIQD Oral Take 1 Can by mouth daily. 30 Can 2  . INSULIN ASPART 100 UNIT/ML [place] SOLN Subcutaneous Inject 0-9 Units into the skin 3 (three) times daily before meals. Sliding scale-give 3 units sq for CBG >150    . IMODIUM PO Oral Take 2 mg by mouth every 4 (four) hours as needed. For diarrhea    . METFORMIN HCL 1000 MG PO TABS Oral Take 1,000 mg by mouth daily with breakfast.     . METOPROLOL TARTRATE 12.5 MG HALF TABLET Oral Take 12.5 mg by mouth 2 (two) times daily.    . ADULT MULTIVITAMIN W/MINERALS CH Oral Take 1 tablet by mouth daily.    Marland Kitchen ONDANSETRON HCL 4 MG PO TABS Oral Take 4 mg by mouth every 8 (eight) hours as needed. For  nausea     . OXYCODONE HCL 5 MG PO TABS Oral Take 5 mg by mouth every 4 (four) hours as needed. For pain    . OXYCODONE HCL 5 MG/5ML PO SOLN Oral Take 5 mg by mouth every 4 (four) hours as needed. For pain    . PANTOPRAZOLE SODIUM 40 MG PO TBEC Oral Take 40 mg by mouth 2 (two) times daily before a meal.    . QUETIAPINE FUMARATE 25 MG PO TABS Oral Take 25 mg by mouth at bedtime.     Marland Kitchen SACCHAROMYCES BOULARDII 250 MG PO CAPS Oral Take 250 mg by mouth 2 (two) times daily.    . SERTRALINE HCL 25 MG PO TABS Oral Take 25 mg by mouth daily. Increase dose to 50mg  daily to start 08/26/11.    Marland Kitchen VANCOMYCIN 1500 MG IVPB Intravenous Inject 1,500 mg into the vein every 12 (twelve) hours.    . TRAZODONE HCL 50 MG PO TABS Oral Take 25 mg by mouth at bedtime as needed. For sleep. May repeat x1 if not asleep after 1 hour.      BP 151/75  Pulse 102  Temp(Src) 99.1 F (37.3 C) (Oral)  Resp 16  SpO2 96%  Physical Exam  ED Course  Procedures (including critical care time)  Labs Reviewed  CBC - Abnormal; Notable for the following:    Hemoglobin 10.6 (*)    HCT 31.7 (*)    RDW 16.7 (*)    All other components within normal limits  BASIC METABOLIC PANEL - Abnormal; Notable for the following:    Sodium 132 (*)    Glucose, Bld 141 (*)    GFR calc non Af Amer 86 (*)    All other components within normal limits  GLUCOSE, CAPILLARY - Abnormal; Notable for the following:    Glucose-Capillary 122 (*)    All other components within normal limits  GLUCOSE, CAPILLARY - Abnormal; Notable for the following:    Glucose-Capillary 104 (*)    All other components within normal limits   No results found.   No diagnosis found.    MDM  1100 report received from Dr.Webb.  Patient here from [facility] receiving IV antibiotics for abdominal abscess post op from prior surgery in 8/12 per [name].    Awaiting PICC line  insertion  to be done at 2 PM per IV therapy.  Attempting to reach her daughter for the PICC  line permit. Patient is alert and oriented but confused at times. Tolerating he has no nausea and vomiting at this time.  0200 Permit from daughter over the phone done.Having diarrhea. Will check for c-diff since she has been on antibiotics for so long.   1600 Spoke with radiologist and they cannot call interventional staff in to insert piccline today. IV therapy's machine is malfunctioning and cannot insert a PICC line in this facility.  We can either send patient home and she can come back tomorrow to have intervention to insert PICC in am or send patient to [facility] to have IV team to do it today.   16:30 IV nurse Hessie Diener states that he can not do a PICC line today at [facility] because he has too much chemo to give.  Suggests that the patient be scheduled with interventional in am.  Next vancomycin dose due at 6pm then 6am.  Will discuss with supervising physician.   1730 Pt will be admitted per hospitalist and have her PICC line inserted in the am.  Labs Reviewed  CBC - Abnormal; Notable for the following:    Hemoglobin 10.6 (*)    HCT 31.7 (*)    RDW 16.7 (*)    All other components within normal limits  BASIC METABOLIC PANEL - Abnormal; Notable for the following:    Sodium 132 (*)    Glucose, Bld 141 (*)    GFR calc non Af Amer 86 (*)    All other components within normal limits  GLUCOSE, CAPILLARY - Abnormal; Notable for the following:    Glucose-Capillary 122 (*)    All other components within normal limits  GLUCOSE, CAPILLARY - Abnormal; Notable for the following:    Glucose-Capillary 104 (*)    All other components within normal limits  CLOSTRIDIUM DIFFICILE BY PCR - Abnormal; Notable for the following:    C difficile by pcr POSITIVE (*)    All other components within normal limits  GLUCOSE, CAPILLARY - Abnormal; Notable for the following:    Glucose-Capillary 105 (*)    All other components within normal limits  GLUCOSE, CAPILLARY - Abnormal; Notable for the following:     Glucose-Capillary 116 (*)    All other components within normal limits  GLUCOSE, CAPILLARY  VANCOMYCIN, TROUGH    Jethro Bastos, NP 08/17/11 1203

## 2011-08-16 NOTE — H&P (Signed)
PCP:   [name], MD, MD   Chief Complaint:  IV access  HPI: Patient is a 78 year old African American female with past medical history of diabetes mellitus and hypertension who was discharged from the surgical service one week ago after a 10 day stay for an abdominal wall infection. Patient was discharged to skilled nursing facility on IV vancomycin via a PICC line for 4 weeks. The patient had received 8/32 days of IV vancomycin so far when her PICC line became unusable today and she was brought into the emergency room for placement. Unfortunately given that this is a Sunday, they're unable to have a PICC line placed in the late of the evening here here at [facility] or [place] [facility]. A peripheral IV was able to place a patient can get her vancomycin tonight and tomorrow morning and the PICC line can be placed and patient can be discharged back to the facility tomorrow.  Review of Systems:  The patient denies fever, weight loss,, vision loss, decreased hearing, hoarseness, chest pain, syncope, dyspnea on exertion, peripheral edema, balance deficits, hemoptysis, abdominal pain, melena, hematochezia, severe indigestion/heartburn, hematuria, incontinence, genital sores, muscle weakness, suspicious skin lesions, transient blindness, difficulty walking, depression, unusual weight change, abnormal bleeding, enlarged lymph nodes, angioedema, and breast masses. Her only complaint is lack of appetite.  Past Medical History: Past Medical History  Diagnosis Date  . Hypertension   . Reflux   . Right elbow pain     OTIF  . Dementia   . Depression   . Osteoarthritis   . Pancreatitis 11/2007    HOP  . Angiomyolipoma of kidney     right  . Ulcerative esophagitis 12/05/2007    hx elevated gastrin, severe on EGD by Dr Jena Gauss , h pylori negative  . Hiatal hernia   . S/P colonoscopy 2009    pt reports normal by Dr Lovell Sheehan  . Diabetes mellitus   . Anemia   . Hyponatremia   . Cellulitis   .  Esophagitis   . Peptic ulcer   . Insomnia    Past Surgical History  Procedure Date  . Orif right hip 1999    [facility]  . Umbilical hernia repair 24 months old    [place]  . Esophagogastroduodenoscopy 01/28/2011    Procedure: ESOPHAGOGASTRODUODENOSCOPY (EGD);  Surgeon: Arlyce Harman, MD;  [facility];  Service: Endoscopy;  Laterality: N/A;  . Colonoscopy 01/28/2011    Procedure: COLONOSCOPY;  Surgeon: Arlyce Harman, MD;  [facility];  Service: Endoscopy;  Laterality: N/A;  . Laparotomy 02/04/2011    Procedure: EXPLORATORY LAPAROTOMY;  Surgeon: Fabio Bering;  Location: [facility] ORS;  Service: General;  Laterality: N/A;  . Laparotomy 05/07/2011    Procedure: EXPLORATORY LAPAROTOMY;  Surgeon: Ardeth Sportsman, MD;  Location: [facility] OR;  Service: General;  Laterality: N/A;  lysis of adhesions  . Gastrostomy 05/07/2011    Procedure: GASTROSTOMY;  Surgeon: Ardeth Sportsman, MD;  Location[facility] OR;  Service: General;  Laterality: N/A;  g-tube / j -tube placement  . Gastrectomy 05/07/2011    Procedure: GASTRECTOMY;  Surgeon: Ardeth Sportsman, MD;  Location: [facility] OR;  Service: General;  Laterality: N/A;  Partial gastrectomy  . Cholecystectomy 05/07/2011    Procedure: CHOLECYSTECTOMY;  Surgeon: Ardeth Sportsman, MD;  Location: [facility] OR;  Service: General;  Laterality: N/A;  open    Medications: Prior to Admission medications   Medication Sig Start Date End Date Taking? Authorizing Provider  acetaminophen (  TYLENOL) 500 MG tablet Take 500 mg by mouth daily as needed. For pain 04/10/11 04/09/12 Yes [name], MD  amLODipine (NORVASC) 5 MG tablet Take 5 mg by mouth daily.   Yes Milinda Antis, MD  benazepril (LOTENSIN) 20 MG tablet Take 20 mg by mouth daily.    Yes Historical Provider, MD  butalbital-acetaminophen-caffeine (FIORICET, ESGIC) 50-325-40 MG per tablet Take 2 tablets by mouth every 8 (eight) hours as needed. For headache 05/03/11 05/02/12 Yes Felisa Bonier, MD  calcium-vitamin D  (OSCAL WITH D) 500-200 MG-UNIT per tablet Take 1 tablet by mouth daily.    Yes Historical Provider, MD  cloNIDine (CATAPRES - DOSED IN MG/24 HR) 0.1 mg/24hr patch Place 1 patch onto the skin once a week. Change on Mondays 07/06/11 07/05/12 Yes Meredeth Ide, MD  darifenacin (ENABLEX) 15 MG 24 hr tablet Take 15 mg by mouth daily.    Yes Historical Provider, MD  Ensure Plus (ENSURE PLUS) LIQD Take 1 Can by mouth daily. 04/16/11  Yes [name], MD  insulin aspart (NOVOLOG) 100 UNIT/ML injection Inject 0-9 Units into the skin 3 (three) times daily before meals. Sliding scale-give 3 units sq for CBG >150 07/06/11 07/05/12 Yes Meredeth Ide, MD  Loperamide HCl (IMODIUM PO) Take 2 mg by mouth every 4 (four) hours as needed. For diarrhea   Yes Historical Provider, MD  metFORMIN (GLUCOPHAGE) 1000 MG tablet Take 1,000 mg by mouth daily with breakfast.    Yes Historical Provider, MD  metoprolol tartrate (LOPRESSOR) 12.5 mg TABS Take 12.5 mg by mouth 2 (two) times daily. 07/06/11  Yes Meredeth Ide, MD  Multiple Vitamin (MULITIVITAMIN WITH MINERALS) TABS Take 1 tablet by mouth daily.   Yes Historical Provider, MD  ondansetron (ZOFRAN) 4 MG tablet Take 4 mg by mouth every 8 (eight) hours as needed. For nausea    Yes Historical Provider, MD  oxyCODONE (OXY IR/ROXICODONE) 5 MG immediate release tablet Take 5 mg by mouth every 4 (four) hours as needed. For pain 08/07/11 08/17/11 Yes Letha Cape, PA  oxyCODONE (ROXICODONE) 5 MG/5ML solution Take 5 mg by mouth every 4 (four) hours as needed. For pain   Yes Historical Provider, MD  pantoprazole (PROTONIX) 40 MG tablet Take 40 mg by mouth 2 (two) times daily before a meal.   Yes Historical Provider, MD  QUEtiapine (SEROQUEL) 25 MG tablet Take 25 mg by mouth at bedtime.    Yes Historical Provider, MD  saccharomyces boulardii (FLORASTOR) 250 MG capsule Take 250 mg by mouth 2 (two) times daily. 08/11/11 09/01/11 Yes Historical Provider, MD  sertraline (ZOLOFT) 25 MG tablet Take 25  mg by mouth daily. Increase dose to 50mg  daily to start 08/26/11. 08/12/11 08/25/11 Yes Historical Provider, MD  sodium chloride 0.9 % SOLN 500 mL with vancomycin 1000 MG SOLR 1,500 mg Inject 1,500 mg into the vein every 12 (twelve) hours. 08/07/11 08/26/11 Yes Letha Cape, PA  traZODone (DESYREL) 50 MG tablet Take 25 mg by mouth at bedtime as needed. For sleep. May repeat x1 if not asleep after 1 hour.   Yes Historical Provider, MD    Allergies:   Allergies  Allergen Reactions  . Other Swelling    Pinto beans cause swelling and hives  . Penicillins Swelling    Social History:  reports that she has never smoked. Her smokeless tobacco use includes Chew. She reports that she does not drink alcohol or use illicit drugs. The patient is normally at baseline able  to participate in a few ADLs, limited by weakness but she needs assistance. She currently resides in a skilled nursing facility.  Family History: Family History  Problem Relation Age of Onset  . Cancer Mother     pelvic     Physical Exam: Filed Vitals:   08/16/11 1206 08/16/11 1304 08/16/11 1606 08/16/11 1900  BP: 151/75 150/77 128/70 133/68  Pulse: 102 96 83 89  Temp:  98.9 F (37.2 C) 99.2 F (37.3 C) 99.3 F (37.4 C)  TempSrc:  Oral Oral Oral  Resp: 16 18 20 18   SpO2: 96% 97% 100% 97%   General: Alert and oriented x2, not sure about the date. Fatigued. No appetite, no acute distress looks older than stated age HEENT, [place], atraumatic, mucous her meds are dry Cardiovascular: Regular rate and rhythm, S1-S2 Lungs: Clear bilaterally Abdomen: Soft, mild distended, nonspecific tenderness, few bowel sounds Extremity, clubbing or cyanosis or edema   Labs on Admission:   [facility] 08/16/11 0520  NA 132*  K 4.0  CL 100  CO2 22  GLUCOSE 141*  BUN 17  CREATININE 0.59  CALCIUM 9.7  MG --  PHOS --    Basename 08/16/11 0520  WBC 7.1  NEUTROABS --  HGB 10.6*  HCT 31.7*  MCV 81.5  PLT 273      Assessment/Plan Present on Admission:  .DIABETES MELLITUS, TYPE II: Continue home insulin  .HYPERLIPIDEMIA: Continue home meds  .DEPRESSION: Continue home antidepressant  .HYPERTENSION: Continue home meds  .Perforated prepyloric gastric ulcer s/p antrectomy with Billroth II reconstruction:: See below. Continue IV antibiotics for now. If patient stays longer than tomorrow, we'll resume TPN  .Difficult intravenous access: Principal problem. Continue IV vancomycin for a peripheral line tonight. PICC line to be placed tomorrow and then patient can be discharged back to skilled nursing facility.   After discussion with the patient, she is to be a full code.  We will respect these wishes.  I anticipate her length of stay to be one day based on issues and plan.  Time spent on this patient including examination and decision-making process: 30 minutes.  Hollice Espy [phone] 08/16/2011, 9:58 PM

## 2011-08-16 NOTE — ED Notes (Signed)
Per EMS: pt from [facility].  Had PICC line in the left upper extremity, came out this morning when pt was walking.  No bleeding when EMS arrived on scene, dressing placed over site.  Pt has no complaints, been alert and oriented.

## 2011-08-16 NOTE — ED Provider Notes (Signed)
There is a problem with a PICC line equipment at [facility]. Patient is unable to receive her PICC line until tomorrow afternoon.  PAC Crawford discussed with PICC line nurse at [facility].  they are not available afternoon on the weekends. Patient's next dose of vancomycin is due to 6 PM. This could not be completed until 10:30 PM. Her next dose to be due at 6am.  As patient has already been in the ED for 12 hours ago it is reasonable to admit  [name], MD 08/16/11 [phone]

## 2011-08-16 NOTE — ED Provider Notes (Signed)
History     CSN: [id number]  Arrival date & time 08/16/11  0455   First MD Initiated Contact with Patient 08/16/11 [phone]      Chief Complaint  Patient presents with  . Vascular Access Problem    (Consider location/radiation/quality/duration/timing/severity/associated sxs/prior treatment) HPI [facility] nursing [facility] patient with recent admission for abdominal abscess. Since nursing home with PICC line is getting vancomycin twice daily. Today she woke up with PICC line completely dislodged. She was sent here for evaluation and treatment. Patient has no complaints. No arm swelling. No bleeding. No recent fevers. No recent vomiting. No abdominal pain or diarrhea. Moderate in severity. Location left upper ext. no chest pain or shortness of breath.  Past Medical History  Diagnosis Date  . Hypertension   . Reflux   . Right elbow pain     OTIF  . Dementia   . Depression   . Osteoarthritis   . Pancreatitis 11/2007    [facility]  . Angiomyolipoma of kidney     right  . Ulcerative esophagitis 12/05/2007    hx elevated gastrin, severe on EGD by Dr Jena Gauss , h pylori negative  . Hiatal hernia   . S/P colonoscopy 2009    pt reports normal by Dr Lovell Sheehan  . Diabetes mellitus   . Anemia   . Hyponatremia   . Cellulitis   . Esophagitis   . Peptic ulcer   . Insomnia     Past Surgical History  Procedure Date  . Orif right hip 1999    [facility]  . Umbilical hernia repair 53 months old    [place]  . Esophagogastroduodenoscopy 01/28/2011    Procedure: ESOPHAGOGASTRODUODENOSCOPY (EGD);  Surgeon: Arlyce Harman, MD;  [facility];  Service: Endoscopy;  Laterality: N/A;  . Colonoscopy 01/28/2011    Procedure: COLONOSCOPY;  Surgeon: Arlyce Harman, MD;  [facility];  Service: Endoscopy;  Laterality: N/A;  . Laparotomy 02/04/2011    Procedure: EXPLORATORY LAPAROTOMY;  Surgeon: Fabio Bering;  Location: [facility]S;  Service: General;  Laterality: N/A;  . Laparotomy 05/07/2011   Procedure: EXPLORATORY LAPAROTOMY;  Surgeon: Ardeth Sportsman, MD;  Location: [facility] OR;  Service: General;  Laterality: N/A;  lysis of adhesions  . Gastrostomy 05/07/2011    Procedure: GASTROSTOMY;  Surgeon: Ardeth Sportsman, MD;  Location: [facility] OR;  Service: General;  Laterality: N/A;  g-tube / j -tube placement  . Gastrectomy 05/07/2011    Procedure: GASTRECTOMY;  Surgeon: Ardeth Sportsman, MD;  Location: [facility] OR;  Service: General;  Laterality: N/A;  Partial gastrectomy  . Cholecystectomy 05/07/2011    Procedure: CHOLECYSTECTOMY;  Surgeon: Ardeth Sportsman, MD;  Location: [facility] OR;  Service: General;  Laterality: N/A;  open    Family History  Problem Relation Age of Onset  . Cancer Mother     pelvic     History  Substance Use Topics  . Smoking status: Never Smoker   . Smokeless tobacco: Current User    Types: Chew  . Alcohol Use: No     Hx of Alcohol dependecy     OB History    Grav Para Term Preterm Abortions TAB SAB Ect Mult Living                  Review of Systems  Constitutional: Negative for fever and chills.  HENT: Negative for neck pain and neck stiffness.   Eyes: Negative for pain.  Respiratory: Negative for shortness of breath.  Cardiovascular: Negative for chest pain.  Gastrointestinal: Negative for abdominal pain.  Genitourinary: Negative for dysuria.  Musculoskeletal: Negative for back pain.  Skin: Negative for rash.  Neurological: Negative for headaches.  All other systems reviewed and are negative.    Allergies  Other and Penicillins  Home Medications   Current Outpatient Rx  Name Route Sig Dispense Refill  . ACETAMINOPHEN 500 MG PO TABS Oral Take 500 mg by mouth daily as needed. For pain    . AMLODIPINE BESYLATE 5 MG PO TABS Oral Take 5 mg by mouth daily.    Marland Kitchen BENAZEPRIL HCL 20 MG PO TABS Oral Take 20 mg by mouth daily.     Marland Kitchen BUTALBITAL-APAP-CAFFEINE 50-325-40 MG PO TABS Oral Take 2 tablets by mouth every 8 (eight) hours as needed. For headache    . CALCIUM  CARBONATE-VITAMIN D 500-200 MG-UNIT PO TABS Oral Take 1 tablet by mouth daily.     Marland Kitchen CLONIDINE HCL 0.1 MG/24HR TD PTWK Transdermal Place 1 patch onto the skin once a week. Change on Mondays    . DARIFENACIN HYDROBROMIDE ER 15 MG PO TB24 Oral Take 15 mg by mouth daily.     Marland Kitchen ENSURE PLUS PO LIQD Oral Take 1 Can by mouth daily. 30 Can 2  . INSULIN ASPART 100 UNIT/ML [place] SOLN Subcutaneous Inject 0-9 Units into the skin 3 (three) times daily before meals. Sliding scale-give 3 units sq for CBG >150    . IMODIUM PO Oral Take 2 mg by mouth every 4 (four) hours as needed. For diarrhea    . METFORMIN HCL 1000 MG PO TABS Oral Take 1,000 mg by mouth daily with breakfast.     . METOPROLOL TARTRATE 12.5 MG HALF TABLET Oral Take 12.5 mg by mouth 2 (two) times daily.    . ADULT MULTIVITAMIN W/MINERALS CH Oral Take 1 tablet by mouth daily.    Marland Kitchen ONDANSETRON HCL 4 MG PO TABS Oral Take 4 mg by mouth every 8 (eight) hours as needed. For nausea     . OXYCODONE HCL 5 MG PO TABS Oral Take 5 mg by mouth every 4 (four) hours as needed. For pain    . OXYCODONE HCL 5 MG/5ML PO SOLN Oral Take 5 mg by mouth every 4 (four) hours as needed. For pain    . PANTOPRAZOLE SODIUM 40 MG PO TBEC Oral Take 40 mg by mouth 2 (two) times daily before a meal.    . QUETIAPINE FUMARATE 25 MG PO TABS Oral Take 25 mg by mouth at bedtime.     Marland Kitchen SACCHAROMYCES BOULARDII 250 MG PO CAPS Oral Take 250 mg by mouth 2 (two) times daily.    . SERTRALINE HCL 25 MG PO TABS Oral Take 25 mg by mouth daily. Increase dose to 50mg  daily to start 08/26/11.    Marland Kitchen VANCOMYCIN 1500 MG IVPB Intravenous Inject 1,500 mg into the vein every 12 (twelve) hours.    . TRAZODONE HCL 50 MG PO TABS Oral Take 25 mg by mouth at bedtime as needed. For sleep. May repeat x1 if not asleep after 1 hour.      BP 157/78  Pulse 95  Temp(Src) 98.4 F (36.9 C) (Oral)  SpO2 97%  Physical Exam  Constitutional: She is oriented to person, place, and time. She appears well-developed and  well-nourished.  HENT:  Head: Normocephalic and atraumatic.  Eyes: Conjunctivae and EOM are normal. Pupils are equal, round, and reactive to light.  Neck: Trachea normal. Neck supple. No  thyromegaly present.  Cardiovascular: Normal rate, regular rhythm, S1 normal, S2 normal and normal pulses.     No systolic murmur is present   No diastolic murmur is present  Pulses:      Radial pulses are 2+ on the right side, and 2+ on the left side.  Pulmonary/Chest: Effort normal and breath sounds normal. She has no wheezes. She has no rhonchi. She has no rales. She exhibits no tenderness.  Abdominal: Soft. Normal appearance and bowel sounds are normal. There is no tenderness. There is no CVA tenderness and negative Murphy's sign.  Musculoskeletal:       Left upper extremity: PICC line site appears well with no associated edema. Distal neurovascular and pulses intact.  Neurological: She is alert and oriented to person, place, and time. She has normal strength. No cranial nerve deficit or sensory deficit. GCS eye subscore is 4. GCS verbal subscore is 5. GCS motor subscore is 6.  Skin: Skin is warm and dry. No rash noted. She is not diaphoretic.  Psychiatric: Her speech is normal.       Cooperative and appropriate    ED Course  Procedures (including critical care time)  Labs Reviewed  CBC - Abnormal; Notable for the following:    Hemoglobin 10.6 (*)    HCT 31.7 (*)    RDW 16.7 (*)    All other components within normal limits  BASIC METABOLIC PANEL - Abnormal; Notable for the following:    Sodium 132 (*)    Glucose, Bld 141 (*)    GFR calc non Af Amer 86 (*)    All other components within normal limits  GLUCOSE, CAPILLARY - Abnormal; Notable for the following:    Glucose-Capillary 122 (*)    All other components within normal limits    I called Nursing home and got patient's vancomycin dose 750 mg, scheduled 6 AM with twice a day dosing. Patient was getting her morning dose via peripheral  IV  Case discussed with radiology at 6 AM and there is no staff and radiology at this hour replacement. I called radiology at 7 AM and there is no daytime staff for PICC line placement in radiology. Interventional available today.   In-house IV team consulted and patient has been placed on the list for possible PICC line today depending on staff and availability. Given the option for discharge home and return at 6 PM for next dose and then return again in the a.m. versus admitted now for possible PICC line, decision made to admit.   7:46 AM IV team called back and believes they can get a PICC line placed at 2 PM. She was placed in the CDU.  MDM   PICC line loss at nursing facility and requires replacement for IV antibiotics status post abdominal abscess.    Once PICC line established, can be discharged back to facility.      Sunnie Nielsen, MD 08/16/11 [phone]

## 2011-08-17 LAB — GLUCOSE, CAPILLARY: Glucose-Capillary: 112 mg/dL — ABNORMAL HIGH (ref 70–99)

## 2011-08-17 LAB — VANCOMYCIN, TROUGH: Vancomycin Tr: 40.9 ug/mL (ref 10.0–20.0)

## 2011-08-17 MED ORDER — SODIUM CHLORIDE 0.9 % IJ SOLN
10.0000 mL | INTRAMUSCULAR | Status: DC | PRN
  Administered 2011-08-18 – 2011-08-19 (×2): 10 mL

## 2011-08-17 MED ORDER — METRONIDAZOLE 500 MG PO TABS
500.0000 mg | ORAL_TABLET | Freq: Three times a day (TID) | ORAL | Status: DC
  Administered 2011-08-17 – 2011-08-19 (×7): 500 mg via ORAL
  Filled 2011-08-17 (×9): qty 1

## 2011-08-17 MED ORDER — INSULIN ASPART 100 UNIT/ML ~~LOC~~ SOLN
0.0000 [IU] | Freq: Three times a day (TID) | SUBCUTANEOUS | Status: DC
  Administered 2011-08-19: 1 [IU] via SUBCUTANEOUS
  Filled 2011-08-17: qty 3

## 2011-08-17 NOTE — Progress Notes (Signed)
Clinical Social Worker completed psychosocial assessment, which can be found in the shadow chart. [name] is from [facility], and plan is to be discharged back to facilit, when medically stable. [name] was pleasant to speak with, but appeared disoriented when speaking with CSW. [name] could not tell the name of the facility she was from or the relationship between her and Langston Reusing. [name] intially stated that [name] was her niece and then changed it to granddaughter when CSW asked more information about [name] to confirm the relationship. CSW will complete FL2 to put in shadow chart for MD's signature and facilitate discharge back to facility when medically ready.   Rozetta Nunnery MSW, [employer] [phone]

## 2011-08-17 NOTE — Progress Notes (Signed)
Utilization Review Completed.[name] T2/18/2013   

## 2011-08-17 NOTE — Progress Notes (Signed)
ANTIBIOTIC CONSULT NOTE - INITIAL  Pharmacy Consult for Vancomycin Indication: MRSE bacteremia, abdominal wall infection  Assessment: 78 yo female admitted from NH for IV access issues. Patient has been on vancomycin since 07/28/11 for MRSE bacteremia.   Spoke with pharmacist from [facility] who has been monitoring the vancomycin at the [facility]. Vancomycin trough 2/12 was 11.2 on 1500 mg q12h and adjusted to 1750 mg q12h. There was another level checked on 2/13 that was 25.2 but felt to be drawn incorrectly and patient was kept on vancomycin 1750 mg q12h.  Vancomycin level is supratherapeutic. Patient received 3 doses of vancomycin yesterday totaling 5 g explaining the high level.  Also spoke with Dr. Brien Few and received an order for pharmacy to take over vancomycin dosing.  Goal of Therapy:  Vancomycin trough level 15-20 mcg/ml  Plan:  1. Hold vancomycin 2. Check random vancomycin level 2/20 with am labs 3. Will f/u renal fxn  Allergies  Allergen Reactions  . Other Swelling    Pinto beans cause swelling and hives  . Penicillins Swelling    Patient Measurements: Height: 5\' 6"  (167.6 cm) Weight: 162 lb (73.483 kg) IBW/kg (Calculated) : 59.3   Vital Signs: Temp: 100.2 F (37.9 C) (02/18 0621) BP: 138/64 mmHg (02/18 1040) Pulse Rate: 118  (02/18 1040) Intake/Output from previous day: 02/17 0701 - 02/18 0700 In: 1833 [I.V.:1833] Out: -  Intake/Output from this shift:    Labs:  [facility] 08/16/11 0520  WBC 7.1  HGB 10.6*  PLT 273  LABCREA --  CREATININE 0.59   Estimated Creatinine Clearance: 60.4 ml/min (by C-G formula based on Cr of 0.59).  Basename 08/17/11 1015  VANCOTROUGH 40.9*  VANCOPEAK --  VANCORANDOM --  GENTTROUGH --  GENTPEAK --  GENTRANDOM --  TOBRATROUGH --  TOBRAPEAK --  TOBRARND --  AMIKACINPEAK --  AMIKACINTROU --  AMIKACIN --     Microbiology: Recent Results (from the past 720 hour(s))  CULTURE, BLOOD (ROUTINE X 2)     Status: Normal   Collection Time   07/28/11 11:30 PM      Component Value Range Status Comment   Specimen Description BLOOD RIGHT HAND   Final    Special Requests BOTTLES DRAWN AEROBIC ONLY 4CC   Final    Culture  Setup Time [id number]   Final    Culture     Final    Value: STAPHYLOCOCCUS SPECIES (COAGULASE NEGATIVE)     Note: SUSCEPTIBILITIES PERFORMED ON PREVIOUS CULTURE WITHIN THE LAST 5 DAYS.     Note: Gram Stain Report Called to,Read Back By and Verified With: PATTY MOSS 07/30/11 0900 BY SMITHERSJ   Report Status 08/01/2011 FINAL   Final   CULTURE, BLOOD (ROUTINE X 2)     Status: Normal   Collection Time   07/28/11 11:42 PM      Component Value Range Status Comment   Specimen Description BLOOD HAND LEFT   Final    Special Requests BOTTLES DRAWN AEROBIC ONLY 5CC   Final    Culture  Setup Time [id number]   Final    Culture     Final    Value: STAPHYLOCOCCUS SPECIES (COAGULASE NEGATIVE)     Note: RIFAMPIN AND GENTAMICIN SHOULD NOT BE USED AS SINGLE DRUGS FOR TREATMENT OF STAPH INFECTIONS.     Note: Gram Stain Report Called to,Read Back By and Verified With: PATTI MOSS 0740 07/30/2011 BY WOODB   Report Status 08/01/2011 FINAL   Final    Organism ID, Bacteria STAPHYLOCOCCUS SPECIES (  COAGULASE NEGATIVE)   Final   WOUND CULTURE     Status: Normal   Collection Time   07/29/11  1:08 PM      Component Value Range Status Comment   Specimen Description WOUND ABDOMEN   Final    Special Requests NONE   Final    Gram Stain     Final    Value: RARE WBC PRESENT, PREDOMINANTLY PMN     NO SQUAMOUS EPITHELIAL CELLS SEEN     NO ORGANISMS SEEN   Culture NO GROWTH 3 DAYS   Final    Report Status 08/01/2011 FINAL   Final   MRSA PCR SCREENING     Status: Normal   Collection Time   07/29/11  8:56 PM      Component Value Range Status Comment   MRSA by PCR NEGATIVE  NEGATIVE  Final   CULTURE, BLOOD (ROUTINE X 2)     Status: Normal   Collection Time   07/31/11  5:31 PM      Component Value Range Status Comment    Specimen Description BLOOD RIGHT ARM   Final    Special Requests BOTTLES DRAWN AEROBIC ONLY 4.5CC   Final    Culture  Setup Time [id number]   Final    Culture     Final    Value: STAPHYLOCOCCUS SPECIES (COAGULASE NEGATIVE)     Note: THE SIGNIFICANCE OF ISOLATING THIS ORGANISM FROM A SINGLE VENIPUNCTURE CANNOT BE PREDICTED WITHOUT FURTHER CLINICAL AND CULTURE CORRELATION. SUSCEPTIBILITIES AVAILABLE ONLY ON REQUEST.     Note: Gram Stain Report Called to,Read Back By and Verified With: Meade Maw RN on 08/03/11 at 05:20 by Christie Nottingham   Report Status 08/04/2011 FINAL   Final   CULTURE, BLOOD (ROUTINE X 2)     Status: Normal   Collection Time   08/03/11  4:41 PM      Component Value Range Status Comment   Specimen Description BLOOD RIGHT HAND   Final    Special Requests BOTTLES DRAWN AEROBIC ONLY 5CC   Final    Culture  Setup Time [id number]   Final    Culture NO GROWTH 5 DAYS   Final    Report Status 08/09/2011 FINAL   Final   CLOSTRIDIUM DIFFICILE BY PCR     Status: Abnormal   Collection Time   08/16/11  1:25 PM      Component Value Range Status Comment   C difficile by pcr POSITIVE (*) NEGATIVE  Final     Medical History: Past Medical History  Diagnosis Date  . Hypertension   . Reflux   . Right elbow pain     OTIF  . Dementia   . Depression   . Osteoarthritis   . Pancreatitis 11/2007    HOP  . Angiomyolipoma of kidney     right  . Ulcerative esophagitis 12/05/2007    hx elevated gastrin, severe on EGD by Dr Jena Gauss , h pylori negative  . Hiatal hernia   . S/P colonoscopy 2009    pt reports normal by Dr Lovell Sheehan  . Diabetes mellitus   . Anemia   . Hyponatremia   . Cellulitis   . Esophagitis   . Peptic ulcer   . Insomnia     Medications:  Prescriptions prior to admission  Medication Sig Dispense Refill  . acetaminophen (TYLENOL) 500 MG tablet Take 500 mg by mouth daily as needed. For pain      . amLODipine (NORVASC) 5 MG tablet  Take 5 mg by mouth daily.      .  benazepril (LOTENSIN) 20 MG tablet Take 20 mg by mouth daily.       . butalbital-acetaminophen-caffeine (FIORICET, ESGIC) 50-325-40 MG per tablet Take 2 tablets by mouth every 8 (eight) hours as needed. For headache      . calcium-vitamin D (OSCAL WITH D) 500-200 MG-UNIT per tablet Take 1 tablet by mouth daily.       . cloNIDine (CATAPRES - DOSED IN MG/24 HR) 0.1 mg/24hr patch Place 1 patch onto the skin once a week. Change on Mondays      . darifenacin (ENABLEX) 15 MG 24 hr tablet Take 15 mg by mouth daily.       . Ensure Plus (ENSURE PLUS) LIQD Take 1 Can by mouth daily.  30 Can  2  . insulin aspart (NOVOLOG) 100 UNIT/ML injection Inject 0-9 Units into the skin 3 (three) times daily before meals. Sliding scale-give 3 units sq for CBG >150      . Loperamide HCl (IMODIUM PO) Take 2 mg by mouth every 4 (four) hours as needed. For diarrhea      . metFORMIN (GLUCOPHAGE) 1000 MG tablet Take 1,000 mg by mouth daily with breakfast.       . metoprolol tartrate (LOPRESSOR) 12.5 mg TABS Take 12.5 mg by mouth 2 (two) times daily.      . Multiple Vitamin (MULITIVITAMIN WITH MINERALS) TABS Take 1 tablet by mouth daily.      . ondansetron (ZOFRAN) 4 MG tablet Take 4 mg by mouth every 8 (eight) hours as needed. For nausea       . oxyCODONE (OXY IR/ROXICODONE) 5 MG immediate release tablet Take 5 mg by mouth every 4 (four) hours as needed. For pain      . oxyCODONE (ROXICODONE) 5 MG/5ML solution Take 5 mg by mouth every 4 (four) hours as needed. For pain      . pantoprazole (PROTONIX) 40 MG tablet Take 40 mg by mouth 2 (two) times daily before a meal.      . QUEtiapine (SEROQUEL) 25 MG tablet Take 25 mg by mouth at bedtime.       . saccharomyces boulardii (FLORASTOR) 250 MG capsule Take 250 mg by mouth 2 (two) times daily.      . sertraline (ZOLOFT) 25 MG tablet Take 25 mg by mouth daily. Increase dose to 50mg  daily to start 08/26/11.      . sodium chloride 0.9 % SOLN 500 mL with vancomycin 1000 MG SOLR 1,500 mg  Inject 1,500 mg into the vein every 12 (twelve) hours.      . traZODone (DESYREL) 50 MG tablet Take 25 mg by mouth at bedtime as needed. For sleep. May repeat x1 if not asleep after 1 hour.        [place], Alanta Scobey Danielle 08/17/2011,12:52 PM

## 2011-08-17 NOTE — Progress Notes (Signed)
Subjective: No new issues overnight.  Occasionally has loose stools. None today.  Objective: Vital signs in last 24 hours: Temp:  [98.7 F (37.1 C)-100.2 F (37.9 C)] 100.2 F (37.9 C) (02/18 0621) Pulse Rate:  [83-118] 118  (02/18 1040) Resp:  [18-20] 19  (02/18 0621) BP: (128-138)/(61-70) 138/64 mmHg (02/18 1040) SpO2:  [94 %-100 %] 96 % (02/18 0621) Weight:  [73.483 kg (162 lb)] 73.483 kg (162 lb) (02/18 1230) Weight change:  Last BM Date: 08/16/11  Intake/Output from previous day: 02/17 0701 - 02/18 0700 In: 1833 [I.V.:1833] Out: -      Physical Exam: General: Sitting in chair, comfortable, alert, communicative, not short of breath at rest.  HEENT:  Mild clinical pallor, no jaundice, no conjunctival injection or discharge. Hydration status is satisfactory. NECK:  Supple, JVP not seen, no carotid bruits, no palpable lymphadenopathy, no palpable goiter. CHEST:  Clinically clear to auscultation, no wheezes, no crackles. HEART:  Sounds 1 and 2 heard, normal, regular, no murmurs. ABDOMEN:  Full, soft, has midline laparotomy scar, which is healed, apart from the upper fourth,  Which is under dry, clean dressings. PEG tube is noted, is clamped, but otherwise, unremarkable. No palpable organomegaly, no palpable masses, normal bowel sounds. GENITALIA:  Not examined. LOWER EXTREMITIES:  No pitting edema, palpable peripheral pulses. MUSCULOSKELETAL SYSTEM:  Generalized osteoarthritic changes, otherwise, normal. CENTRAL NERVOUS SYSTEM:  No focal neurologic deficit on gross examination.  Lab Results:  [facility] 08/16/11 0520  WBC 7.1  HGB 10.6*  HCT 31.7*  PLT 273    Basename 08/16/11 0520  NA 132*  K 4.0  CL 100  CO2 22  GLUCOSE 141*  BUN 17  CREATININE 0.59  CALCIUM 9.7   Recent Results (from the past 240 hour(s))  CLOSTRIDIUM DIFFICILE BY PCR     Status: Abnormal   Collection Time   08/16/11  1:25 PM      Component Value Range Status Comment   C difficile by pcr  POSITIVE (*) NEGATIVE  Final      Studies/Results: No results found.  Medications: Scheduled Meds:   . sodium chloride   Intravenous STAT  . acetaminophen  650 mg Oral Once  . amLODipine  5 mg Oral Daily  . benazepril  20 mg Oral Daily  . calcium-vitamin D  1 tablet Oral Daily  . darifenacin  15 mg Oral Daily  . Ensure Plus  1 Can Oral Daily  . feeding supplement  237 mL Oral Once  . insulin aspart  0-9 Units Subcutaneous TID AC  . metFORMIN  1,000 mg Oral Q breakfast  . metoprolol tartrate  12.5 mg Oral BID  . metroNIDAZOLE  500 mg Oral Q8H  . mulitivitamin with minerals  1 tablet Oral Daily  . pantoprazole  40 mg Oral BID AC  . QUEtiapine  25 mg Oral QHS  . saccharomyces boulardii  250 mg Oral BID  . sertraline  25 mg Oral Daily  . vancomycin  1,750 mg Intravenous Once  . DISCONTD: cloNIDine  0.1 mg Transdermal Weekly  . DISCONTD: metFORMIN  1,000 mg Oral Q breakfast  . DISCONTD: metoprolol tartrate  12.5 mg Oral BID  . DISCONTD: QUEtiapine  25 mg Oral QHS  . DISCONTD: saccharomyces boulardii  250 mg Oral BID  . DISCONTD: vancomycin (VANCOCIN) 1500 mg IVPB  1,500 mg Intravenous Q12H   Continuous Infusions:  PRN Meds:.acetaminophen, butalbital-acetaminophen-caffeine, ondansetron (ZOFRAN) IV, ondansetron, oxyCODONE, oxyCODONE, traZODone  Assessment/Plan:  Principal Problem:  *Difficult intravenous access:  Patient had PICC line for planned several week course of Vancomycin therapy, now in day# 9/32. This line has become unusable, so she is now awaiting new PICC. Awaiting iv team. Active Problems:  1. DIABETES MELLITUS, TYPE II: Controlled on Metformin and SSI.  2. HYPERLIPIDEMIA: Per history. Not currently on statin. Will check lipids.  3. DEPRESSION: Stable on pre-admission psychotropics.  4. HYPERTENSION: Controlled on Amlodipine, beta-blocker and ACE-i.  5. Perforated prepyloric gastric ulcer s/p antrectomy with Billroth II reconstruction. This was complicated by  abdominal wound infection as described above. Patient is otherwise, stable.  6. C. Difficile Colitis: Patient admits to occasional loose bowel movements,, sometimes twice a day or more. No diarrhea noted today, but C.Diff PCR is positive. Will commence 10-day course of oral Flagyl.    LOS: 1 day   [name],[name] 08/17/2011, 3:49 PM

## 2011-08-18 ENCOUNTER — Encounter (INDEPENDENT_AMBULATORY_CARE_PROVIDER_SITE_OTHER): Payer: Medicare HMO | Admitting: Surgery

## 2011-08-18 DIAGNOSIS — A0472 Enterocolitis due to Clostridium difficile, not specified as recurrent: Secondary | ICD-10-CM | POA: Diagnosis not present

## 2011-08-18 LAB — GLUCOSE, CAPILLARY
Glucose-Capillary: 124 mg/dL — ABNORMAL HIGH (ref 70–99)
Glucose-Capillary: 107 mg/dL — ABNORMAL HIGH (ref 70–99)
Glucose-Capillary: 100 mg/dL — ABNORMAL HIGH (ref 70–99)
Glucose-Capillary: 116 mg/dL — ABNORMAL HIGH (ref 70–99)

## 2011-08-18 LAB — COMPREHENSIVE METABOLIC PANEL
ALT: 13 U/L (ref 0–35)
Alkaline Phosphatase: 99 U/L (ref 39–117)
BUN: 7 mg/dL (ref 6–23)
CO2: 26 mEq/L (ref 19–32)
GFR calc Af Amer: >90 mL/min (ref >90)
GFR calc non Af Amer: 85 mL/min — ABNORMAL LOW (ref >90)
Glucose, Bld: 108 mg/dL — ABNORMAL HIGH (ref 70–99)
Potassium: 3.3 mEq/L — ABNORMAL LOW (ref 3.5–5.1)
Total Bilirubin: 0.1 mg/dL — ABNORMAL LOW (ref 0.3–1.2)
Total Protein: 7 g/dL (ref 6.0–8.3)

## 2011-08-18 LAB — CBC
Hemoglobin: 8.9 g/dL — ABNORMAL LOW (ref 12.0–15.0)
MCH: 26.3 pg (ref 26.0–34.0)
MCV: 81.7 fL (ref 78.0–100.0)
Platelets: 259 10*3/uL (ref 150–400)
RBC: 3.38 MIL/uL — ABNORMAL LOW (ref 3.87–5.11)
WBC: 6.2 10*3/uL (ref 4.0–10.5)

## 2011-08-18 LAB — VANCOMYCIN, RANDOM: Vancomycin Rm: 10.8 ug/mL

## 2011-08-18 MED ORDER — SODIUM CHLORIDE 0.9 % IV SOLN
1750.0000 mg | INTRAVENOUS | Status: AC
  Administered 2011-08-18: 1750 mg via INTRAVENOUS
  Filled 2011-08-18 (×2): qty 1750

## 2011-08-18 MED ORDER — METRONIDAZOLE 500 MG PO TABS: 500.0000 mg | ORAL_TABLET | Freq: Three times a day (TID) | ORAL | Status: AC

## 2011-08-18 MED ORDER — VANCOMYCIN HCL 1000 MG IV SOLR
1750.0000 mg | Freq: Two times a day (BID) | INTRAVENOUS | Status: DC
  Administered 2011-08-19: 1750 mg via INTRAVENOUS
  Filled 2011-08-18 (×3): qty 1750

## 2011-08-18 MED ORDER — BUTALBITAL-APAP-CAFFEINE 50-325-40 MG PO TABS: 2.0000 | ORAL_TABLET | Freq: Three times a day (TID) | ORAL | Status: DC | PRN

## 2011-08-18 MED ORDER — OXYCODONE HCL 5 MG/5ML PO SOLN: 5.0000 mg | ORAL | Status: DC | PRN

## 2011-08-18 NOTE — Discharge Summary (Addendum)
Physician Discharge Summary  Patient ID: [name] MRN: [id number] DOB/AGE: 76-Oct-1933 76 y.o.  Admit date: 08/16/2011 Discharge date: 08/18/2011  Primary Care Physician:  [name], MD, MD   Discharge Diagnoses:    Patient Active Problem List  Diagnoses  . DIABETES MELLITUS, TYPE II  . HYPERLIPIDEMIA  . Obesity, unspecified  . DEPRESSION  . HYPERTENSION  . DIVERTICULOSIS OF COLON  . ACUTE CYSTITIS  . HEMATOMA  . Anemia  . Hyponatremia  . Perforated prepyloric gastric ulcer s/p antrectomy with Billroth II reconstruction  . Ulcer, colon  . Acute renal failure  . Confusion  . Metabolic acidosis  . Esophagitis, erosive  . 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 Wound healing, delayed  . Difficult intravenous access  . C. difficile colitis    Medication List  As of 08/18/2011  2:20 PM   STOP taking these medications         oxyCODONE 5 MG immediate release tablet         TAKE these medications         acetaminophen 500 MG tablet   Commonly known as: TYLENOL   Take 500 mg by mouth daily as needed. For pain      amLODipine 5 MG tablet   Commonly known as: NORVASC   Take 5 mg by mouth daily.      benazepril 20 MG tablet   Commonly known as: LOTENSIN   Take 20 mg by mouth daily.      butalbital-acetaminophen-caffeine 50-325-40 MG per tablet   Commonly known as: FIORICET, ESGIC   Take 2 tablets by mouth every 8 (eight) hours as needed. For headache      calcium-vitamin D 500-200 MG-UNIT per tablet   Commonly known as: OSCAL WITH D   Take 1 tablet by mouth daily.      cloNIDine 0.1 mg/24hr patch   Commonly known as: CATAPRES - Dosed in mg/24 hr   Place 1 patch onto the skin once a week. Change on Mondays      darifenacin 15 MG 24 hr tablet   Commonly known as: ENABLEX   Take 15 mg by mouth daily.      Ensure Plus Liqd   Take 1 Can by mouth daily.      IMODIUM PO   Take 2 mg by mouth every 4 (four) hours as needed.  For diarrhea      insulin aspart 100 UNIT/ML injection   Commonly known as: novoLOG   Inject 0-9 Units into the skin 3 (three) times daily before meals. Sliding scale-give 3 units sq for CBG >150      metFORMIN 1000 MG tablet   Commonly known as: GLUCOPHAGE   Take 1,000 mg by mouth daily with breakfast.      metoprolol tartrate 12.5 mg Tabs   Commonly known as: LOPRESSOR   Take 12.5 mg by mouth 2 (two) times daily.      metroNIDAZOLE 500 MG tablet   Commonly known as: FLAGYL   Take 1 tablet (500 mg total) by mouth every 8 (eight) hours.      mulitivitamin with minerals Tabs   Take 1 tablet by mouth daily.      ondansetron 4 MG tablet   Commonly known as: ZOFRAN   Take 4 mg by mouth every 8 (eight) hours as needed. For nausea        oxyCODONE 5 MG/5ML solution   Commonly known as: ROXICODONE   Take  5 mLs (5 mg total) by mouth every 4 (four) hours as needed. For pain      pantoprazole 40 MG tablet   Commonly known as: PROTONIX   Take 40 mg by mouth 2 (two) times daily before a meal.      PRESCRIPTION MEDICATION   Inject into the vein continuous. Home TPN.  Patient gets supply from [facility] [phone]).      QUEtiapine 25 MG tablet   Commonly known as: SEROQUEL   Take 25 mg by mouth at bedtime.      saccharomyces boulardii 250 MG capsule   Commonly known as: FLORASTOR   Take 250 mg by mouth 2 (two) times daily.      sertraline 25 MG tablet   Commonly known as: ZOLOFT   Take 25 mg by mouth daily. Increase dose to 50mg  daily to start 08/26/11.      sodium chloride 0.9 % SOLN 500 mL with vancomycin 1000 MG SOLR 1,500 mg   Inject 1,500 mg into the vein every 12 (twelve) hours.      traZODone 50 MG tablet   Commonly known as: DESYREL   Take 25 mg by mouth at bedtime as needed. For sleep. May repeat x1 if not asleep after 1 hour.             Disposition and Follow-up:  Follow up with primary MD and SNF MD.  Consults:  None    Significant  Diagnostic Studies:  No results found.  Brief H and P: For complete details, refer to admission H and P. However, in brief, this is a  78 year old female, with known history of HTN, DM 2, history of perforated duodenal ulcer, status post antrectomy, duodenal resection, Billroth 2 reconstruction, omental patching of Duodenal bulb, open cholecystectomy, gastrostomy and jejunostomy on 05/07/11. Her course was complicated by epigastric abscess requiring percutaneous drain per IR 05/31/11. Her clinical course was complicated by S. Aureus bacteremia and anterior abdominal wall infection and readmissions. She was eventually discharged from the surgical service on to SNF on 08/07/11, after a 10-day stay, and has been on iv Vancomycin via PICC. Her PICC line became unusable on 08/16/11, she was brought into the ED and was subsequently admitted.  Physical Exam: O 08/18/11. General: Sitting in chair, comfortable, alert, communicative, not short of breath at rest.  HEENT: Mild clinical pallor, no jaundice, no conjunctival injection or discharge. Hydration status is satisfactory.  NECK: Supple, JVP not seen, no carotid bruits, no palpable lymphadenopathy, no palpable goiter.  CHEST: Clinically clear to auscultation, no wheezes, no crackles.  HEART: Sounds 1 and 2 heard, normal, regular, no murmurs.  ABDOMEN: Full, soft, has midline laparotomy scar, which is healed, apart from the upper fourth, Which is under dry, clean dressings. PEG tube is noted, is clamped, but otherwise, unremarkable. No palpable organomegaly, no palpable masses, normal bowel sounds.  GENITALIA: Not examined.  LOWER EXTREMITIES: No pitting edema, palpable peripheral pulses.  MUSCULOSKELETAL SYSTEM: Generalized osteoarthritic changes, otherwise, normal.  CENTRAL NERVOUS SYSTEM: No focal neurologic deficit on gross examination.   Hospital Course:  Principal Problem:  *Difficult intravenous access: Patient had PICC line for a planned several  week course of Vancomycin therapy, now in day# 10/32. This line had become unusable, and was replaced on 08/17/2011, without incident. Antibiotics continue, per original plan. Active Problems:  1. DIABETES MELLITUS, TYPE II: This was controlled on Metformin and SSI, and patient remained euglycemic.  2. HYPERLIPIDEMIA: Per history. Not currently  on statin. We did check lipid profile, but the report was not available at the time of discharge.  3. DEPRESSION: Stable on pre-admission psychotropics.  4. HYPERTENSION: Patient's blood pressure was controlled with Amlodipine, beta-blocker and ACE-i.  5. Perforated prepyloric gastric ulcer s/p antrectomy with Billroth II reconstruction. This was complicated by abdominal wound infection as described above. Patient is otherwise, stable and wound appears to be healing well.  6. C. Difficile Colitis: Patient admits to occasional loose bowel movements, sometimes twice a day or more. No diarrhea noted during patient's hospitalization, but C.Diff PCR was positive. She was commenced on a 10-day course of oral Flagyl, to be concluded on 08/27/11. She was already on probiotic pre-admission, and this will be continued on discharge. 7. Anemia - chronic. P1robably multifactorial . Needs outpatient workup with anemia panel. Start empiric iron   Comment: patient was considered clinically stable for discharge on 08/18/11.  Time spent on Discharge: 45 mins.  Signed: OTI,CHRISTOPHER 08/18/2011, 2:20 PM

## 2011-08-18 NOTE — Progress Notes (Signed)
Utilization Review Completed.[name] T2/19/2013

## 2011-08-18 NOTE — ED Provider Notes (Signed)
Medical screening examination/treatment/procedure(s) were conducted as a shared visit with non-physician practitioner(s) and myself.  I personally evaluated the patient during the encounter   Forbes Cellar, MD 08/18/11 [phone]

## 2011-08-18 NOTE — Progress Notes (Signed)
ANTIBIOTIC CONSULT NOTE - INITIAL  Pharmacy Consult for Vancomycin Indication: MRSE bacteremia, abdominal wall infection  Assessment: 78 yo female admitted from NH for IV access issues. Patient has been on vancomycin since 07/28/11 for MRSE bacteremia. Elevated vanc random level of 40.9 mcg/mL yesterday convinces me that the patient might have gotten 5gm of vanc (2gm extra from normal daily dose). With patient's stable renal function, it is logical that the vanc has been cleared (last dose on 08/16/11 at ~2330).  Random vanc levels 16.5 and 10.8 mcg/mL, consistent with patient's calculated Ke = 0.0454 and half-life ~14 hours.  Noted pt to be d/c'ed.  Goal of Therapy:  Vancomycin trough level 15-20 mcg/ml  Plan:  - Restart vanc at 1750mg  IV Q12H - Monitor renal fxn, vanc trough at steady-state   Phillips Climes, PharmD 08/18/2011    Allergies  Allergen Reactions  . Other Swelling    Pinto beans cause swelling and hives  . Penicillins Swelling    Patient Measurements: Height: 5\' 6"  (167.6 cm) Weight: 162 lb (73.483 kg) IBW/kg (Calculated) : 59.3   Vital Signs: Temp: 98.6 F (37 C) (02/19 0618) Temp src: Oral (02/19 0618) BP: 128/59 mmHg (02/19 0902) Pulse Rate: 96  (02/19 0902) Intake/Output from previous day:   Intake/Output from this shift:    Labs:  [facility] 08/18/11 0608 08/16/11 0520  WBC 6.2 7.1  HGB 8.9* 10.6*  PLT 259 273  LABCREA -- --  CREATININE 0.62 0.59   Estimated Creatinine Clearance: 60.4 ml/min (by C-G formula based on Cr of 0.62).  Basename 08/18/11 1425 08/18/11 0608 08/17/11 1015  VANCOTROUGH -- -- 40.9*  VANCOPEAK -- -- --  VANCORANDOM 10.8 16.5 --  GENTTROUGH -- -- --  GENTPEAK -- -- --  GENTRANDOM -- -- --  TOBRATROUGH -- -- --  TOBRAPEAK -- -- --  TOBRARND -- -- --  AMIKACINPEAK -- -- --  AMIKACINTROU -- -- --  AMIKACIN -- -- --     Microbiology: Recent Results (from the past 720 hour(s))  CULTURE, BLOOD (ROUTINE X 2)     Status:  Normal   Collection Time   07/28/11 11:30 PM      Component Value Range Status Comment   Specimen Description BLOOD RIGHT HAND   Final    Special Requests BOTTLES DRAWN AEROBIC ONLY 4CC   Final    Culture  Setup Time [id number]   Final    Culture     Final    Value: STAPHYLOCOCCUS SPECIES (COAGULASE NEGATIVE)     Note: SUSCEPTIBILITIES PERFORMED ON PREVIOUS CULTURE WITHIN THE LAST 5 DAYS.     Note: Gram Stain Report Called to,Read Back By and Verified With: PATTY MOSS 07/30/11 0900 BY SMITHERSJ   Report Status 08/01/2011 FINAL   Final   CULTURE, BLOOD (ROUTINE X 2)     Status: Normal   Collection Time   07/28/11 11:42 PM      Component Value Range Status Comment   Specimen Description BLOOD HAND LEFT   Final    Special Requests BOTTLES DRAWN AEROBIC ONLY 5CC   Final    Culture  Setup Time [id number]   Final    Culture     Final    Value: STAPHYLOCOCCUS SPECIES (COAGULASE NEGATIVE)     Note: RIFAMPIN AND GENTAMICIN SHOULD NOT BE USED AS SINGLE DRUGS FOR TREATMENT OF STAPH INFECTIONS.     Note: Gram Stain Report Called to,Read Back By and Verified With: PATTI MOSS [id number] 07/30/2011 BY WOODB  Report Status 08/01/2011 FINAL   Final    Organism ID, Bacteria STAPHYLOCOCCUS SPECIES (COAGULASE NEGATIVE)   Final   WOUND CULTURE     Status: Normal   Collection Time   07/29/11  1:08 PM      Component Value Range Status Comment   Specimen Description WOUND ABDOMEN   Final    Special Requests NONE   Final    Gram Stain     Final    Value: RARE WBC PRESENT, PREDOMINANTLY PMN     NO SQUAMOUS EPITHELIAL CELLS SEEN     NO ORGANISMS SEEN   Culture NO GROWTH 3 DAYS   Final    Report Status 08/01/2011 FINAL   Final   MRSA PCR SCREENING     Status: Normal   Collection Time   07/29/11  8:56 PM      Component Value Range Status Comment   MRSA by PCR NEGATIVE  NEGATIVE  Final   CULTURE, BLOOD (ROUTINE X 2)     Status: Normal   Collection Time   07/31/11  5:31 PM      Component Value Range Status  Comment   Specimen Description BLOOD RIGHT ARM   Final    Special Requests BOTTLES DRAWN AEROBIC ONLY 4.5CC   Final    Culture  Setup Time [id number]   Final    Culture     Final    Value: STAPHYLOCOCCUS SPECIES (COAGULASE NEGATIVE)     Note: THE SIGNIFICANCE OF ISOLATING THIS ORGANISM FROM A SINGLE VENIPUNCTURE CANNOT BE PREDICTED WITHOUT FURTHER CLINICAL AND CULTURE CORRELATION. SUSCEPTIBILITIES AVAILABLE ONLY ON REQUEST.     Note: Gram Stain Report Called to,Read Back By and Verified With: Meade Maw RN on 08/03/11 at 05:20 by Christie Nottingham   Report Status 08/04/2011 FINAL   Final   CULTURE, BLOOD (ROUTINE X 2)     Status: Normal   Collection Time   08/03/11  4:41 PM      Component Value Range Status Comment   Specimen Description BLOOD RIGHT HAND   Final    Special Requests BOTTLES DRAWN AEROBIC ONLY 5CC   Final    Culture  Setup Time [id number]   Final    Culture NO GROWTH 5 DAYS   Final    Report Status 08/09/2011 FINAL   Final   CLOSTRIDIUM DIFFICILE BY PCR     Status: Abnormal   Collection Time   08/16/11  1:25 PM      Component Value Range Status Comment   C difficile by pcr POSITIVE (*) NEGATIVE  Final     Medical History: Past Medical History  Diagnosis Date  . Hypertension   . Reflux   . Right elbow pain     OTIF  . Dementia   . Depression   . Osteoarthritis   . Pancreatitis 11/2007    [facility]  . Angiomyolipoma of kidney     right  . Ulcerative esophagitis 12/05/2007    hx elevated gastrin, severe on EGD by Dr Jena Gauss , h pylori negative  . Hiatal hernia   . S/P colonoscopy 2009    pt reports normal by Dr Lovell Sheehan  . Diabetes mellitus   . Anemia   . Hyponatremia   . Cellulitis   . Esophagitis   . Peptic ulcer   . Insomnia     Medications:  Prescriptions prior to admission  Medication Sig Dispense Refill  . acetaminophen (TYLENOL) 500 MG tablet Take 500 mg by mouth daily  as needed. For pain      . amLODipine (NORVASC) 5 MG tablet Take 5 mg by mouth daily.        . benazepril (LOTENSIN) 20 MG tablet Take 20 mg by mouth daily.       . calcium-vitamin D (OSCAL WITH D) 500-200 MG-UNIT per tablet Take 1 tablet by mouth daily.       . cloNIDine (CATAPRES - DOSED IN MG/24 HR) 0.1 mg/24hr patch Place 1 patch onto the skin once a week. Change on Mondays      . darifenacin (ENABLEX) 15 MG 24 hr tablet Take 15 mg by mouth daily.       . Ensure Plus (ENSURE PLUS) LIQD Take 1 Can by mouth daily.  30 Can  2  . insulin aspart (NOVOLOG) 100 UNIT/ML injection Inject 0-9 Units into the skin 3 (three) times daily before meals. Sliding scale-give 3 units sq for CBG >150      . Loperamide HCl (IMODIUM PO) Take 2 mg by mouth every 4 (four) hours as needed. For diarrhea      . metFORMIN (GLUCOPHAGE) 1000 MG tablet Take 1,000 mg by mouth daily with breakfast.       . metoprolol tartrate (LOPRESSOR) 12.5 mg TABS Take 12.5 mg by mouth 2 (two) times daily.      . Multiple Vitamin (MULITIVITAMIN WITH MINERALS) TABS Take 1 tablet by mouth daily.      . ondansetron (ZOFRAN) 4 MG tablet Take 4 mg by mouth every 8 (eight) hours as needed. For nausea       . oxyCODONE (OXY IR/ROXICODONE) 5 MG immediate release tablet Take 5 mg by mouth every 4 (four) hours as needed. For pain      . pantoprazole (PROTONIX) 40 MG tablet Take 40 mg by mouth 2 (two) times daily before a meal.      . PRESCRIPTION MEDICATION Inject into the vein continuous. Home TPN.  Patient gets supply from [facility] [phone]).      . QUEtiapine (SEROQUEL) 25 MG tablet Take 25 mg by mouth at bedtime.       . saccharomyces boulardii (FLORASTOR) 250 MG capsule Take 250 mg by mouth 2 (two) times daily.      . sertraline (ZOLOFT) 25 MG tablet Take 25 mg by mouth daily. Increase dose to 50mg  daily to start 08/26/11.      . sodium chloride 0.9 % SOLN 500 mL with vancomycin 1000 MG SOLR 1,500 mg Inject 1,500 mg into the vein every 12 (twelve) hours.      . traZODone (DESYREL) 50 MG tablet Take 25 mg by mouth at  bedtime as needed. For sleep. May repeat x1 if not asleep after 1 hour.      Marland Kitchen DISCONTD: butalbital-acetaminophen-caffeine (FIORICET, ESGIC) 50-325-40 MG per tablet Take 2 tablets by mouth every 8 (eight) hours as needed. For headache      . DISCONTD: oxyCODONE (ROXICODONE) 5 MG/5ML solution Take 5 mg by mouth every 4 (four) hours as needed. For pain

## 2011-08-18 NOTE — Progress Notes (Addendum)
Clinical Social Worker facilitated discharge by contacting facility and family (could not get in contact with [facility]). Facility, [employer] is working on authorization from [employer] and will need authorization before patient can be returned. Patient will be transported via EMS once authorization is obtained.   Patient's printed packet is located in [place] with PTAR's form and patient's facesheet.   Rozetta Nunnery MSW, [employer] [phone]

## 2011-08-19 MED ORDER — HEPARIN SOD (PORK) LOCK FLUSH 100 UNIT/ML IV SOLN
250.0000 [IU] | INTRAVENOUS | Status: AC | PRN
  Administered 2011-08-19: 250 [IU]

## 2011-08-19 MED ORDER — FERROUS GLUCONATE IRON 246 (28 FE) MG PO TABS: 246.0000 mg | ORAL_TABLET | Freq: Every day | ORAL | Status: DC

## 2011-08-19 NOTE — Progress Notes (Signed)
Clinical Social Worker received verification from facility that they received authorization from [employer] company for patient to return back today. CSW notified RN and patient will be transported via EMS. CSW will sign off.   Rozetta Nunnery MSW, [employer] [phone]

## 2011-08-19 NOTE — Progress Notes (Signed)
Pt has wound on midline abd at the top of an old healed incision. Wound is 1.0 x 1.0 cm; base of wound is red; Rn placed dry gauze on top. No tunneling or undermining.

## 2011-08-26 ENCOUNTER — Other Ambulatory Visit: Payer: Self-pay | Admitting: Internal Medicine

## 2011-08-26 ENCOUNTER — Ambulatory Visit (HOSPITAL_COMMUNITY)
Admission: RE | Admit: 2011-08-26 | Discharge: 2011-08-26 | Disposition: A | Discharge status: Home / Self Care | Payer: Medicare HMO | Source: Ambulatory Visit | Attending: Internal Medicine | Admitting: Internal Medicine

## 2011-08-26 DIAGNOSIS — K2210 Ulcer of esophagus without bleeding: Secondary | ICD-10-CM

## 2011-08-26 DIAGNOSIS — F329 Major depressive disorder, single episode, unspecified: Secondary | ICD-10-CM

## 2011-08-26 DIAGNOSIS — R410 Disorientation, unspecified: Secondary | ICD-10-CM

## 2011-08-26 DIAGNOSIS — T8112XA Postprocedural septic shock, initial encounter: Secondary | ICD-10-CM

## 2011-08-26 DIAGNOSIS — N3000 Acute cystitis without hematuria: Secondary | ICD-10-CM

## 2011-08-26 DIAGNOSIS — G4700 Insomnia, unspecified: Secondary | ICD-10-CM

## 2011-08-26 DIAGNOSIS — D649 Anemia, unspecified: Secondary | ICD-10-CM

## 2011-08-26 DIAGNOSIS — N179 Acute kidney failure, unspecified: Secondary | ICD-10-CM

## 2011-08-26 DIAGNOSIS — K261 Acute duodenal ulcer with perforation: Secondary | ICD-10-CM

## 2011-08-26 DIAGNOSIS — R110 Nausea: Secondary | ICD-10-CM

## 2011-08-26 DIAGNOSIS — E871 Hypo-osmolality and hyponatremia: Secondary | ICD-10-CM

## 2011-08-26 DIAGNOSIS — K5730 Diverticulosis of large intestine without perforation or abscess without bleeding: Secondary | ICD-10-CM

## 2011-08-26 DIAGNOSIS — K633 Ulcer of intestine: Secondary | ICD-10-CM

## 2011-08-26 DIAGNOSIS — F3289 Other specified depressive episodes: Secondary | ICD-10-CM

## 2011-08-26 DIAGNOSIS — I10 Essential (primary) hypertension: Secondary | ICD-10-CM

## 2011-08-26 DIAGNOSIS — Z789 Other specified health status: Secondary | ICD-10-CM

## 2011-08-26 DIAGNOSIS — E669 Obesity, unspecified: Secondary | ICD-10-CM

## 2011-08-26 DIAGNOSIS — E119 Type 2 diabetes mellitus without complications: Secondary | ICD-10-CM

## 2011-08-26 DIAGNOSIS — E872 Acidosis, unspecified: Secondary | ICD-10-CM

## 2011-08-26 DIAGNOSIS — B9681 Helicobacter pylori [H. pylori] as the cause of diseases classified elsewhere: Secondary | ICD-10-CM

## 2011-08-26 DIAGNOSIS — T148XXA Other injury of unspecified body region, initial encounter: Secondary | ICD-10-CM

## 2011-08-26 DIAGNOSIS — A0472 Enterocolitis due to Clostridium difficile, not specified as recurrent: Secondary | ICD-10-CM

## 2011-08-26 DIAGNOSIS — K316 Fistula of stomach and duodenum: Secondary | ICD-10-CM

## 2011-08-26 DIAGNOSIS — E785 Hyperlipidemia, unspecified: Secondary | ICD-10-CM

## 2011-08-26 DIAGNOSIS — T148XXD Other injury of unspecified body region, subsequent encounter: Secondary | ICD-10-CM

## 2011-08-26 DIAGNOSIS — Z452 Encounter for adjustment and management of vascular access device: Secondary | ICD-10-CM | POA: Insufficient documentation

## 2011-08-26 MED ORDER — CHLORHEXIDINE GLUCONATE 4 % EX LIQD
CUTANEOUS | Status: AC
  Filled 2011-08-26: qty 30

## 2011-08-26 NOTE — Procedures (Signed)
Success RUE Picc exchg for a DBL lumen power PICC Tip svc/ra Ready to use No comp Full report in PACS

## 2011-08-27 ENCOUNTER — Telehealth (HOSPITAL_COMMUNITY): Payer: Self-pay

## 2011-09-04 ENCOUNTER — Encounter (INDEPENDENT_AMBULATORY_CARE_PROVIDER_SITE_OTHER): Payer: Self-pay | Admitting: Surgery

## 2011-09-10 ENCOUNTER — Other Ambulatory Visit: Payer: Self-pay | Admitting: Adult Health

## 2011-09-10 DIAGNOSIS — E46 Unspecified protein-calorie malnutrition: Secondary | ICD-10-CM

## 2011-09-11 ENCOUNTER — Ambulatory Visit (HOSPITAL_COMMUNITY)
Admission: RE | Admit: 2011-09-11 | Discharge: 2011-09-11 | Disposition: A | Discharge status: Home / Self Care | Payer: Medicare HMO | Source: Ambulatory Visit | Attending: Adult Health | Admitting: Adult Health

## 2011-09-11 DIAGNOSIS — E46 Unspecified protein-calorie malnutrition: Secondary | ICD-10-CM | POA: Insufficient documentation

## 2011-09-11 MED ORDER — LIDOCAINE HCL 1 % IJ SOLN
INTRAMUSCULAR | Status: AC
  Filled 2011-09-11: qty 20

## 2011-09-11 NOTE — Procedures (Signed)
Fluoroscopic guided exchange of right SL brachial vein PICC performed. Length 39 cm . Tip SVC/RA junction. No immediate complications.

## 2011-09-29 ENCOUNTER — Ambulatory Visit (HOSPITAL_COMMUNITY)
Admission: RE | Admit: 2011-09-29 | Discharge: 2011-09-29 | Disposition: A | Discharge status: Home / Self Care | Payer: Medicare HMO | Source: Ambulatory Visit | Attending: Internal Medicine | Admitting: Internal Medicine

## 2011-09-29 ENCOUNTER — Other Ambulatory Visit (HOSPITAL_BASED_OUTPATIENT_CLINIC_OR_DEPARTMENT_OTHER): Payer: Self-pay | Admitting: Internal Medicine

## 2011-09-29 DIAGNOSIS — R627 Adult failure to thrive: Secondary | ICD-10-CM

## 2011-09-29 DIAGNOSIS — E119 Type 2 diabetes mellitus without complications: Secondary | ICD-10-CM | POA: Insufficient documentation

## 2011-09-29 MED ORDER — LIDOCAINE HCL 1 % IJ SOLN
INTRAMUSCULAR | Status: AC
  Filled 2011-09-29: qty 20

## 2011-10-12 ENCOUNTER — Emergency Department (HOSPITAL_COMMUNITY): Payer: Medicare HMO

## 2011-10-12 ENCOUNTER — Encounter (HOSPITAL_COMMUNITY): Payer: Self-pay | Admitting: Emergency Medicine

## 2011-10-12 ENCOUNTER — Inpatient Hospital Stay (HOSPITAL_COMMUNITY)
Admission: EM | Admit: 2011-10-12 | Discharge: 2011-10-27 | DRG: 314 | Disposition: A | Discharge status: Skilled Nursing Facility | Payer: Medicare HMO | Attending: Internal Medicine | Admitting: Internal Medicine

## 2011-10-12 DIAGNOSIS — N179 Acute kidney failure, unspecified: Secondary | ICD-10-CM

## 2011-10-12 DIAGNOSIS — E872 Acidosis, unspecified: Secondary | ICD-10-CM

## 2011-10-12 DIAGNOSIS — E669 Obesity, unspecified: Secondary | ICD-10-CM

## 2011-10-12 DIAGNOSIS — G9340 Encephalopathy, unspecified: Secondary | ICD-10-CM

## 2011-10-12 DIAGNOSIS — A419 Sepsis, unspecified organism: Secondary | ICD-10-CM

## 2011-10-12 DIAGNOSIS — J9601 Acute respiratory failure with hypoxia: Secondary | ICD-10-CM

## 2011-10-12 DIAGNOSIS — E785 Hyperlipidemia, unspecified: Secondary | ICD-10-CM

## 2011-10-12 DIAGNOSIS — T148XXD Other injury of unspecified body region, subsequent encounter: Secondary | ICD-10-CM

## 2011-10-12 DIAGNOSIS — R7881 Bacteremia: Secondary | ICD-10-CM

## 2011-10-12 DIAGNOSIS — E871 Hypo-osmolality and hyponatremia: Secondary | ICD-10-CM

## 2011-10-12 DIAGNOSIS — IMO0002 Reserved for concepts with insufficient information to code with codable children: Secondary | ICD-10-CM | POA: Diagnosis present

## 2011-10-12 DIAGNOSIS — N3000 Acute cystitis without hematuria: Secondary | ICD-10-CM

## 2011-10-12 DIAGNOSIS — J90 Pleural effusion, not elsewhere classified: Secondary | ICD-10-CM | POA: Diagnosis not present

## 2011-10-12 DIAGNOSIS — R6521 Severe sepsis with septic shock: Secondary | ICD-10-CM | POA: Diagnosis present

## 2011-10-12 DIAGNOSIS — K632 Fistula of intestine: Secondary | ICD-10-CM | POA: Diagnosis present

## 2011-10-12 DIAGNOSIS — E876 Hypokalemia: Secondary | ICD-10-CM | POA: Diagnosis present

## 2011-10-12 DIAGNOSIS — N390 Urinary tract infection, site not specified: Secondary | ICD-10-CM

## 2011-10-12 DIAGNOSIS — K2210 Ulcer of esophagus without bleeding: Secondary | ICD-10-CM

## 2011-10-12 DIAGNOSIS — K7200 Acute and subacute hepatic failure without coma: Secondary | ICD-10-CM | POA: Diagnosis not present

## 2011-10-12 DIAGNOSIS — I10 Essential (primary) hypertension: Secondary | ICD-10-CM

## 2011-10-12 DIAGNOSIS — K633 Ulcer of intestine: Secondary | ICD-10-CM

## 2011-10-12 DIAGNOSIS — Y849 Medical procedure, unspecified as the cause of abnormal reaction of the patient, or of later complication, without mention of misadventure at the time of the procedure: Secondary | ICD-10-CM | POA: Diagnosis present

## 2011-10-12 DIAGNOSIS — M7989 Other specified soft tissue disorders: Secondary | ICD-10-CM

## 2011-10-12 DIAGNOSIS — Z789 Other specified health status: Secondary | ICD-10-CM | POA: Diagnosis present

## 2011-10-12 DIAGNOSIS — I742 Embolism and thrombosis of arteries of the upper extremities: Secondary | ICD-10-CM | POA: Diagnosis not present

## 2011-10-12 DIAGNOSIS — T80219A Unspecified infection due to central venous catheter, initial encounter: Secondary | ICD-10-CM | POA: Diagnosis present

## 2011-10-12 DIAGNOSIS — A0472 Enterocolitis due to Clostridium difficile, not specified as recurrent: Secondary | ICD-10-CM | POA: Diagnosis not present

## 2011-10-12 DIAGNOSIS — I959 Hypotension, unspecified: Secondary | ICD-10-CM | POA: Diagnosis present

## 2011-10-12 DIAGNOSIS — F329 Major depressive disorder, single episode, unspecified: Secondary | ICD-10-CM

## 2011-10-12 DIAGNOSIS — F0390 Unspecified dementia without behavioral disturbance: Secondary | ICD-10-CM | POA: Diagnosis present

## 2011-10-12 DIAGNOSIS — F17200 Nicotine dependence, unspecified, uncomplicated: Secondary | ICD-10-CM | POA: Diagnosis present

## 2011-10-12 DIAGNOSIS — J9600 Acute respiratory failure, unspecified whether with hypoxia or hypercapnia: Secondary | ICD-10-CM | POA: Diagnosis not present

## 2011-10-12 DIAGNOSIS — F3289 Other specified depressive episodes: Secondary | ICD-10-CM

## 2011-10-12 DIAGNOSIS — T148XXA Other injury of unspecified body region, initial encounter: Secondary | ICD-10-CM

## 2011-10-12 DIAGNOSIS — E119 Type 2 diabetes mellitus without complications: Secondary | ICD-10-CM

## 2011-10-12 DIAGNOSIS — R410 Disorientation, unspecified: Secondary | ICD-10-CM

## 2011-10-12 DIAGNOSIS — K5730 Diverticulosis of large intestine without perforation or abscess without bleeding: Secondary | ICD-10-CM

## 2011-10-12 DIAGNOSIS — K316 Fistula of stomach and duodenum: Secondary | ICD-10-CM

## 2011-10-12 DIAGNOSIS — D649 Anemia, unspecified: Secondary | ICD-10-CM

## 2011-10-12 DIAGNOSIS — T80212A Local infection due to central venous catheter, initial encounter: Principal | ICD-10-CM | POA: Diagnosis present

## 2011-10-12 DIAGNOSIS — B9681 Helicobacter pylori [H. pylori] as the cause of diseases classified elsewhere: Secondary | ICD-10-CM

## 2011-10-12 LAB — URINALYSIS, ROUTINE W REFLEX MICROSCOPIC
Glucose, UA: NEGATIVE mg/dL
Specific Gravity, Urine: >1.03 — ABNORMAL HIGH (ref 1.005–1.030)
pH: 5.5 (ref 5.0–8.0)

## 2011-10-12 LAB — GLUCOSE, CAPILLARY: Glucose-Capillary: 208 mg/dL — ABNORMAL HIGH (ref 70–99)

## 2011-10-12 LAB — COMPREHENSIVE METABOLIC PANEL
ALT: 40 U/L — ABNORMAL HIGH (ref 0–35)
AST: 47 U/L — ABNORMAL HIGH (ref 0–37)
Albumin: 2.6 g/dL — ABNORMAL LOW (ref 3.5–5.2)
CO2: 18 mEq/L — ABNORMAL LOW (ref 19–32)
Calcium: 8.9 mg/dL (ref 8.4–10.5)
Chloride: 93 mEq/L — ABNORMAL LOW (ref 96–112)
Creatinine, Ser: 1.31 mg/dL — ABNORMAL HIGH (ref 0.50–1.10)
GFR calc non Af Amer: 38 mL/min — ABNORMAL LOW (ref >90)
Sodium: 129 mEq/L — ABNORMAL LOW (ref 135–145)

## 2011-10-12 LAB — URINE MICROSCOPIC-ADD ON

## 2011-10-12 LAB — DIFFERENTIAL
Basophils Absolute: 0 10*3/uL (ref 0.0–0.1)
Basophils Relative: 0 % (ref 0–1)
Eosinophils Relative: 0 % (ref 0–5)
Lymphocytes Relative: 6 % — ABNORMAL LOW (ref 12–46)
Monocytes Absolute: 0.9 10*3/uL (ref 0.1–1.0)
Neutro Abs: 13.3 10*3/uL — ABNORMAL HIGH (ref 1.7–7.7)

## 2011-10-12 LAB — CBC
HCT: 30.7 % — ABNORMAL LOW (ref 36.0–46.0)
MCHC: 33.2 g/dL (ref 30.0–36.0)
Platelets: 236 10*3/uL (ref 150–400)
RDW: 18.9 % — ABNORMAL HIGH (ref 11.5–15.5)
WBC: 15.1 10*3/uL — ABNORMAL HIGH (ref 4.0–10.5)

## 2011-10-12 MED ORDER — SODIUM CHLORIDE 0.9 % IV BOLUS (SEPSIS)
1000.0000 mL | Freq: Once | INTRAVENOUS | Status: AC
  Administered 2011-10-12: 1000 mL via INTRAVENOUS

## 2011-10-12 MED ORDER — SODIUM CHLORIDE 0.9 % IV SOLN
INTRAVENOUS | Status: DC
  Administered 2011-10-12: 21:00:00 via INTRAVENOUS

## 2011-10-12 MED ORDER — ONDANSETRON HCL 4 MG/2ML IJ SOLN
4.0000 mg | Freq: Once | INTRAMUSCULAR | Status: AC
  Administered 2011-10-12: 4 mg via INTRAVENOUS
  Filled 2011-10-12: qty 2

## 2011-10-12 MED ORDER — ACETAMINOPHEN 650 MG RE SUPP
RECTAL | Status: AC
  Filled 2011-10-12: qty 1

## 2011-10-12 MED ORDER — ACETAMINOPHEN 650 MG RE SUPP
650.0000 mg | Freq: Once | RECTAL | Status: AC
  Administered 2011-10-12: 650 mg via RECTAL

## 2011-10-12 MED ORDER — DEXTROSE 5 % IV SOLN
2.0000 g | INTRAVENOUS | Status: AC
  Administered 2011-10-12: 2 g via INTRAVENOUS
  Filled 2011-10-12: qty 2

## 2011-10-12 MED ORDER — VANCOMYCIN HCL IN DEXTROSE 1-5 GM/200ML-% IV SOLN
1000.0000 mg | Freq: Once | INTRAVENOUS | Status: AC
  Administered 2011-10-12: 1000 mg via INTRAVENOUS
  Filled 2011-10-12: qty 200

## 2011-10-12 NOTE — ED Notes (Signed)
Old and new EKG handed to Dr. Brooke Dare.  Extra copy placed in pt chart

## 2011-10-12 NOTE — ED Notes (Signed)
IV team paged for IV placement.

## 2011-10-12 NOTE — ED Notes (Signed)
Blood cultures are drawn per phlebotomy.

## 2011-10-12 NOTE — ED Notes (Signed)
Md notified of pt BP

## 2011-10-12 NOTE — ED Notes (Signed)
Staff at nursing home stated to EMS 3 days ago left side picc redness and swelling today fever 102 and green pus from picc line Alert to patient normal.

## 2011-10-12 NOTE — ED Notes (Signed)
Pt's blood pressure palpated at 70

## 2011-10-12 NOTE — ED Provider Notes (Signed)
History     CSN: [id number]  Arrival date & time 10/12/11  4098   First MD Initiated Contact with Patient 10/12/11 1848      Chief Complaint  Patient presents with  . Fever    (Consider location/radiation/quality/duration/timing/severity/associated sxs/prior treatment) HPI Comments: Sent for PICC line infection  Patient is a 77 y.o. female presenting with fever. The history is provided by the patient and the nursing home. No language interpreter was used.  Fever Primary symptoms of the febrile illness include fever, fatigue, nausea and vomiting. Primary symptoms do not include visual change, headaches, cough, wheezing, shortness of breath, abdominal pain or diarrhea. The current episode started yesterday. This is a new problem. The problem has been gradually worsening.  The fever began today. The fever has been gradually worsening since its onset. The maximum temperature recorded prior to her arrival was 102 to 102.9 F. The temperature was taken by an oral thermometer.  The fatigue began yesterday. The fatigue has been worsening since its onset.   Nausea began yesterday.   The vomiting began yesterday. Vomiting occurs 2 to 5 times per day. The emesis contains stomach contents.     Past Medical History  Diagnosis Date  . Hypertension   . Reflux   . Right elbow pain     OTIF  . Dementia   . Depression   . Osteoarthritis   . Pancreatitis 11/2007    [facility]  . Angiomyolipoma of kidney     right  . Ulcerative esophagitis 12/05/2007    hx elevated gastrin, severe on EGD by Dr Jena Gauss , h pylori negative  . Hiatal hernia   . S/P colonoscopy 2009    pt reports normal by Dr Lovell Sheehan  . Diabetes mellitus   . Anemia   . Hyponatremia   . Cellulitis   . Esophagitis   . Peptic ulcer   . Insomnia     Past Surgical History  Procedure Date  . Orif right hip 1999    [facility]  . Umbilical hernia repair 55 months old    [place]  . Esophagogastroduodenoscopy 01/28/2011    Procedure:  ESOPHAGOGASTRODUODENOSCOPY (EGD);  Surgeon: Arlyce Harman, MD;  Location: [facility];  Service: Endoscopy;  Laterality: N/A;  . Colonoscopy 01/28/2011    Procedure: COLONOSCOPY;  Surgeon: Arlyce Harman, MD;  Location: [facility];  Service: Endoscopy;  Laterality: N/A;  . Laparotomy 02/04/2011    Procedure: EXPLORATORY LAPAROTOMY;  Surgeon: Fabio Bering;  Location: [facility] ORS;  Service: General;  Laterality: N/A;  . Laparotomy 05/07/2011    Procedure: EXPLORATORY LAPAROTOMY;  Surgeon: Ardeth Sportsman, MD;  Location: [facility] OR;  Service: General;  Laterality: N/A;  lysis of adhesions  . Gastrostomy 05/07/2011    Procedure: GASTROSTOMY;  Surgeon: Ardeth Sportsman, MD;  Location: [facility] OR;  Service: General;  Laterality: N/A;  g-tube / j -tube placement  . Gastrectomy 05/07/2011    Procedure: GASTRECTOMY;  Surgeon: Ardeth Sportsman, MD;  Location: [facility] OR;  Service: General;  Laterality: N/A;  Partial gastrectomy  . Cholecystectomy 05/07/2011    Procedure: CHOLECYSTECTOMY;  Surgeon: Ardeth Sportsman, MD;  Location[facility] OR;  Service: General;  Laterality: N/A;  open    Family History  Problem Relation Age of Onset  . Cancer Mother     pelvic     History  Substance Use Topics  . Smoking status: Never Smoker   . Smokeless tobacco: Current User    Types: Chew  .  Alcohol Use: No     Hx of Alcohol dependecy     OB History    Grav Para Term Preterm Abortions TAB SAB Ect Mult Living                  Review of Systems  Unable to perform ROS: Dementia  Constitutional: Positive for fever and fatigue.  Respiratory: Negative for cough, shortness of breath and wheezing.   Gastrointestinal: Positive for nausea and vomiting. Negative for abdominal pain and diarrhea.  Neurological: Negative for headaches.    Allergies  Other and Penicillins  Home Medications   Current Outpatient Rx  Name Route Sig Dispense Refill  . ACETAMINOPHEN 500 MG PO TABS Oral Take 500 mg by mouth daily as needed. For pain      . AMLODIPINE BESYLATE 5 MG PO TABS Oral Take 5 mg by mouth daily.    Marland Kitchen BENAZEPRIL HCL 20 MG PO TABS Oral Take 20 mg by mouth daily.     Marland Kitchen BUTALBITAL-APAP-CAFFEINE 50-325-40 MG PO TABS Oral Take 2 tablets by mouth every 8 (eight) hours as needed. For headache    . CALCIUM CARBONATE-VITAMIN D 500-200 MG-UNIT PO TABS Oral Take 1 tablet by mouth daily.     Marland Kitchen CLONIDINE HCL 0.1 MG/24HR TD PTWK Transdermal Place 1 patch onto the skin once a week. Change on Mondays    . DARIFENACIN HYDROBROMIDE ER 15 MG PO TB24 Oral Take 15 mg by mouth daily.     . ERGOCALCIFEROL 50000 UNITS PO CAPS Oral Take 50,000 Units by mouth once a week.    . INSULIN ASPART 100 UNIT/ML [place] SOLN Subcutaneous Inject 3 Units into the skin 3 (three) times daily before meals. Sliding scale-give 3 units sq for CBG >150    . INSULIN REGULAR HUMAN 100 UNIT/ML IJ SOLN Subcutaneous Inject 65 Units into the skin daily.    . IMODIUM PO Oral Take 2 mg by mouth every 4 (four) hours as needed. For diarrhea    . METFORMIN HCL 1000 MG PO TABS Oral Take 1,000 mg by mouth daily with breakfast.     . METOPROLOL TARTRATE 12.5 MG HALF TABLET Oral Take 12.5 mg by mouth 2 (two) times daily.    . ADULT MULTIVITAMIN W/MINERALS CH Oral Take 1 tablet by mouth daily.    Marland Kitchen ONDANSETRON HCL 4 MG PO TABS Oral Take 4 mg by mouth every 8 (eight) hours as needed. For nausea     . OXYCODONE HCL 5 MG/5ML PO SOLN Oral Take 5 mg by mouth every 4 (four) hours as needed. For pain    . PANTOPRAZOLE SODIUM 40 MG PO TBEC Oral Take 40 mg by mouth 2 (two) times daily before a meal.    . PRESCRIPTION MEDICATION Intravenous Inject into the vein continuous. Home TPN.  Patient gets supply from [facility] [phone]).    . QUETIAPINE FUMARATE 25 MG PO TABS Oral Take 25 mg by mouth at bedtime.     . TRAZODONE HCL 50 MG PO TABS Oral Take 25-50 mg by mouth at bedtime as needed. For sleep      BP 90/20  Pulse 116  Temp(Src) 102.6 F (39.2 C) (Core (Comment))  Resp 28   SpO2 99%  Physical Exam  Nursing note and vitals reviewed. Constitutional: She appears well-developed and well-nourished.  HENT:  Head: Normocephalic and atraumatic.  Mouth/Throat: Oropharynx is clear and moist.  Eyes: Conjunctivae and EOM are normal. Pupils are equal, round, and reactive to  light.  Neck: Normal range of motion. Neck supple.  Cardiovascular: Regular rhythm, normal heart sounds and intact distal pulses.  Exam reveals no gallop and no friction rub.   No murmur heard.      Tachycardic rate  Pulmonary/Chest: Effort normal and breath sounds normal. No respiratory distress.  Abdominal: Soft. Bowel sounds are normal. There is no tenderness. There is no rebound and no guarding.  Musculoskeletal: Normal range of motion. She exhibits no edema and no tenderness.  Neurological:       Alert to normal  Skin: Skin is warm and dry. No rash noted.       Erythema around PICC line    ED Course  Procedures (including critical care time)  CRITICAL CARE Performed by: Dayton Bailiff   Total critical care time: 45 min  Critical care time was exclusive of separately billable procedures and treating other patients.  Critical care was necessary to treat or prevent imminent or life-threatening deterioration.  Critical care was time spent personally by me on the following activities: development of treatment plan with patient and/or surrogate as well as nursing, discussions with consultants, evaluation of patient's response to treatment, examination of patient, obtaining history from patient or surrogate, ordering and performing treatments and interventions, ordering and review of laboratory studies, ordering and review of radiographic studies, pulse oximetry and re-evaluation of patient's condition.   Date: 10/12/2011  Rate: 137  Rhythm: sinus tachycardia  QRS Axis: normal  Intervals: normal  ST/T Wave abnormalities: normal  Conduction Disutrbances:none  Narrative Interpretation:    Old EKG Reviewed: unchanged  Labs Reviewed  CBC - Abnormal; Notable for the following:    WBC 15.1 (*)    RBC 3.82 (*)    Hemoglobin 10.2 (*)    HCT 30.7 (*)    RDW 18.9 (*)    All other components within normal limits  DIFFERENTIAL - Abnormal; Notable for the following:    Neutrophils Relative 88 (*)    Neutro Abs 13.3 (*)    Lymphocytes Relative 6 (*)    All other components within normal limits  COMPREHENSIVE METABOLIC PANEL - Abnormal; Notable for the following:    Sodium 129 (*)    Chloride 93 (*)    CO2 18 (*)    Glucose, Bld 216 (*)    BUN 38 (*)    Creatinine, Ser 1.31 (*)    Albumin 2.6 (*)    AST 47 (*)    ALT 40 (*)    Alkaline Phosphatase 174 (*)    GFR calc non Af Amer 38 (*)    GFR calc Af Amer 44 (*)    All other components within normal limits  URINALYSIS, ROUTINE W REFLEX MICROSCOPIC - Abnormal; Notable for the following:    APPearance TURBID (*)    Specific Gravity, Urine >1.030 (*)    Hgb urine dipstick LARGE (*)    Bilirubin Urine SMALL (*)    Protein, ur 100 (*)    Leukocytes, UA MODERATE (*)    All other components within normal limits  LACTIC ACID, PLASMA - Abnormal; Notable for the following:    Lactic Acid, Venous 4.0 (*)    All other components within normal limits  GLUCOSE, CAPILLARY - Abnormal; Notable for the following:    Glucose-Capillary 208 (*)    All other components within normal limits  URINE MICROSCOPIC-ADD ON - Abnormal; Notable for the following:    Bacteria, UA FEW (*)    Casts GRANULAR CAST (*)  All other components within normal limits  URINE CULTURE  CULTURE, BLOOD (ROUTINE X 2)  CULTURE, BLOOD (ROUTINE X 2)  PROCALCITONIN  LACTIC ACID, PLASMA   Dg Chest Port 1 View  10/12/2011  *RADIOLOGY REPORT*  Clinical Data: Fever.  Drainage about a PICC.  PORTABLE CHEST - 1 VIEW  Comparison: Fluoroscopic spot view from PICC placement 09/29/2011. Plain films of the chest 07/28/2011.  CT chest 01/26/2011.  Findings: The patient's  right PICC has backed out with the tip in the right axilla.  Lungs are clear.  Heart size is normal.  No pneumothorax or pleural effusion.  IMPRESSION:  1.  Tip of the patient's right PICC is now in the right axilla. 2.  No acute cardiopulmonary disease.  Original Report Authenticated By: Bernadene Bell. D'ALESSIO, M.D.     1. Metabolic acidosis   2. Sepsis   3. UTI (lower urinary tract infection)   4. PICC line infection   5. Hyponatremia   6. ARF (acute renal failure)       MDM  Patient with sepsis likely secondary to a possible PICC line source as well as a urinary tract infection. She is receiving antibiotics for C. difficile infections. She'll require admission for likely PICC line removal and additional treatment of sepsis. Was placed on broad spectrum antibiotics on arrival. She'll receive vancomycin and cefepime. She's had a lactate of 4.0 and hypotension. Discussed the case with the critical care physician who recommended aggressive fluid administration and admission to triad hospitalist service on the step down unit. She'll receive a total of 4 L of IV fluids. She has use of the PICC line as well as 2 peripheral IVs. Critical care will be recalled if there is worsening of her condition        Dayton Bailiff, MD 10/12/11 2216

## 2011-10-13 ENCOUNTER — Encounter (HOSPITAL_COMMUNITY): Payer: Self-pay | Admitting: Internal Medicine

## 2011-10-13 ENCOUNTER — Inpatient Hospital Stay (HOSPITAL_COMMUNITY): Payer: Medicare HMO

## 2011-10-13 DIAGNOSIS — A419 Sepsis, unspecified organism: Secondary | ICD-10-CM | POA: Diagnosis present

## 2011-10-13 DIAGNOSIS — G9340 Encephalopathy, unspecified: Secondary | ICD-10-CM | POA: Diagnosis present

## 2011-10-13 DIAGNOSIS — J9601 Acute respiratory failure with hypoxia: Secondary | ICD-10-CM | POA: Diagnosis present

## 2011-10-13 DIAGNOSIS — R7881 Bacteremia: Secondary | ICD-10-CM | POA: Diagnosis present

## 2011-10-13 DIAGNOSIS — I959 Hypotension, unspecified: Secondary | ICD-10-CM | POA: Diagnosis present

## 2011-10-13 DIAGNOSIS — T80219A Unspecified infection due to central venous catheter, initial encounter: Secondary | ICD-10-CM | POA: Diagnosis present

## 2011-10-13 DIAGNOSIS — N390 Urinary tract infection, site not specified: Secondary | ICD-10-CM | POA: Diagnosis present

## 2011-10-13 DIAGNOSIS — M7989 Other specified soft tissue disorders: Secondary | ICD-10-CM | POA: Diagnosis present

## 2011-10-13 LAB — COMPREHENSIVE METABOLIC PANEL
ALT: 76 U/L — ABNORMAL HIGH (ref 0–35)
AST: 89 U/L — ABNORMAL HIGH (ref 0–37)
Albumin: 2.1 g/dL — ABNORMAL LOW (ref 3.5–5.2)
BUN: 30 mg/dL — ABNORMAL HIGH (ref 6–23)
Calcium: 7.7 mg/dL — ABNORMAL LOW (ref 8.4–10.5)
Calcium: 7.9 mg/dL — ABNORMAL LOW (ref 8.4–10.5)
Creatinine, Ser: 1.02 mg/dL (ref 0.50–1.10)
GFR calc Af Amer: >90 mL/min (ref >90)
Glucose, Bld: 115 mg/dL — ABNORMAL HIGH (ref 70–99)
Sodium: 133 mEq/L — ABNORMAL LOW (ref 135–145)
Total Bilirubin: 0.2 mg/dL — ABNORMAL LOW (ref 0.3–1.2)
Total Protein: 5.9 g/dL — ABNORMAL LOW (ref 6.0–8.3)
Total Protein: 6.1 g/dL (ref 6.0–8.3)

## 2011-10-13 LAB — CBC
Hemoglobin: 8.1 g/dL — ABNORMAL LOW (ref 12.0–15.0)
Hemoglobin: 8.3 g/dL — ABNORMAL LOW (ref 12.0–15.0)
MCH: 26.3 pg (ref 26.0–34.0)
MCH: 26.5 pg (ref 26.0–34.0)
MCHC: 33.5 g/dL (ref 30.0–36.0)
MCV: 81.2 fL (ref 78.0–100.0)
Platelets: 209 10*3/uL (ref 150–400)
Platelets: 172 10*3/uL (ref 150–400)
RBC: 3.08 MIL/uL — ABNORMAL LOW (ref 3.87–5.11)
WBC: 13.3 10*3/uL — ABNORMAL HIGH (ref 4.0–10.5)

## 2011-10-13 LAB — URINALYSIS, ROUTINE W REFLEX MICROSCOPIC
Leukocytes, UA: NEGATIVE
Nitrite: NEGATIVE
Specific Gravity, Urine: 1.018 (ref 1.005–1.030)
Urobilinogen, UA: 1 mg/dL (ref 0.0–1.0)

## 2011-10-13 LAB — DIFFERENTIAL
Eosinophils Absolute: 0 10*3/uL (ref 0.0–0.7)
Eosinophils Relative: 0 % (ref 0–5)
Lymphocytes Relative: 12 % (ref 12–46)
Lymphs Abs: 1.7 10*3/uL (ref 0.7–4.0)
Monocytes Relative: 8 % (ref 3–12)

## 2011-10-13 LAB — POCT I-STAT 3, ART BLOOD GAS (G3+)
Bicarbonate: 15.8 mEq/L — ABNORMAL LOW (ref 20.0–24.0)
TCO2: 17 mmol/L (ref 0–100)
pCO2 arterial: 27.1 mmHg — ABNORMAL LOW (ref 35.0–45.0)
pH, Arterial: 7.374 (ref 7.350–7.400)

## 2011-10-13 LAB — PROTIME-INR: Prothrombin Time: 19.2 seconds — ABNORMAL HIGH (ref 11.6–15.2)

## 2011-10-13 LAB — LACTIC ACID, PLASMA: Lactic Acid, Venous: 2.1 mmol/L (ref 0.5–2.2)

## 2011-10-13 LAB — CARDIAC PANEL(CRET KIN+CKTOT+MB+TROPI)
CK, MB: 2.4 ng/mL (ref 0.3–4.0)
Relative Index: 1.1 (ref 0.0–2.5)
Total CK: 213 U/L — ABNORMAL HIGH (ref 7–177)
Troponin I: <0.3 ng/mL (ref <0.30)

## 2011-10-13 LAB — URINE MICROSCOPIC-ADD ON

## 2011-10-13 LAB — GLUCOSE, CAPILLARY
Glucose-Capillary: 120 mg/dL — ABNORMAL HIGH (ref 70–99)
Glucose-Capillary: 95 mg/dL (ref 70–99)

## 2011-10-13 LAB — CARBOXYHEMOGLOBIN
Methemoglobin: 1.1 % (ref 0.0–1.5)
O2 Saturation: 56.9 %
Total hemoglobin: 7.8 g/dL — ABNORMAL LOW (ref 12.5–16.0)

## 2011-10-13 LAB — MRSA PCR SCREENING: MRSA by PCR: NEGATIVE

## 2011-10-13 MED ORDER — ACETAMINOPHEN 650 MG RE SUPP
650.0000 mg | Freq: Four times a day (QID) | RECTAL | Status: DC | PRN
  Administered 2011-10-13 – 2011-10-18 (×4): 650 mg via RECTAL
  Filled 2011-10-13 (×7): qty 1

## 2011-10-13 MED ORDER — AZTREONAM 1 G IJ SOLR
1.0000 g | Freq: Three times a day (TID) | INTRAMUSCULAR | Status: DC
  Filled 2011-10-13 (×2): qty 1

## 2011-10-13 MED ORDER — VANCOMYCIN HCL 1000 MG IV SOLR
1250.0000 mg | Freq: Two times a day (BID) | INTRAVENOUS | Status: DC
  Administered 2011-10-13 – 2011-10-15 (×5): 1250 mg via INTRAVENOUS
  Filled 2011-10-13 (×6): qty 1250

## 2011-10-13 MED ORDER — ACETAMINOPHEN 325 MG PO TABS
650.0000 mg | ORAL_TABLET | Freq: Four times a day (QID) | ORAL | Status: DC | PRN
  Administered 2011-10-13 – 2011-10-27 (×11): 650 mg via ORAL
  Filled 2011-10-13 (×9): qty 2

## 2011-10-13 MED ORDER — ONDANSETRON HCL 4 MG PO TABS: 4.0000 mg | ORAL_TABLET | Freq: Four times a day (QID) | ORAL | Status: DC | PRN

## 2011-10-13 MED ORDER — IOHEXOL 300 MG/ML  SOLN
80.0000 mL | Freq: Once | INTRAMUSCULAR | Status: AC | PRN
  Administered 2011-10-13: 11:00:00 80 mL via INTRAVENOUS

## 2011-10-13 MED ORDER — ENOXAPARIN SODIUM 40 MG/0.4ML ~~LOC~~ SOLN
40.0000 mg | SUBCUTANEOUS | Status: DC
  Administered 2011-10-13 – 2011-10-14 (×2): 40 mg via SUBCUTANEOUS
  Filled 2011-10-13 (×2): qty 0.4

## 2011-10-13 MED ORDER — DEXTROSE 5 % IV SOLN: 2.0000 g | Freq: Once | INTRAVENOUS | Status: DC

## 2011-10-13 MED ORDER — DEXTROSE 5 % IV SOLN
1.0000 g | Freq: Three times a day (TID) | INTRAVENOUS | Status: DC
  Administered 2011-10-13 (×2): 1 g via INTRAVENOUS
  Filled 2011-10-13 (×4): qty 1

## 2011-10-13 MED ORDER — DOBUTAMINE IN D5W 4-5 MG/ML-% IV SOLN
2.5000 ug/kg/min | INTRAVENOUS | Status: DC | PRN
  Administered 2011-10-13: 2.5 ug/kg/min via INTRAVENOUS
  Filled 2011-10-13: qty 250

## 2011-10-13 MED ORDER — IOHEXOL 300 MG/ML  SOLN
20.0000 mL | INTRAMUSCULAR | Status: AC
  Administered 2011-10-13 (×2): 20 mL via ORAL

## 2011-10-13 MED ORDER — SODIUM CHLORIDE 0.9 % IV BOLUS (SEPSIS)
25.0000 mL/kg | Freq: Once | INTRAVENOUS | Status: AC
  Administered 2011-10-13: 1000 mL via INTRAVENOUS

## 2011-10-13 MED ORDER — SODIUM CHLORIDE 0.9 % IV SOLN
INTRAVENOUS | Status: DC
  Administered 2011-10-13: 14:00:00 via INTRAVENOUS
  Administered 2011-10-13: 150 mL/h via INTRAVENOUS
  Administered 2011-10-20: 500 mL via INTRAVENOUS

## 2011-10-13 MED ORDER — VANCOMYCIN HCL IN DEXTROSE 1-5 GM/200ML-% IV SOLN: 1000.0000 mg | Freq: Once | INTRAVENOUS | Status: DC

## 2011-10-13 MED ORDER — SODIUM CHLORIDE 0.9 % IV SOLN: INTRAVENOUS | Status: DC

## 2011-10-13 MED ORDER — ONDANSETRON HCL 4 MG/2ML IJ SOLN
4.0000 mg | Freq: Four times a day (QID) | INTRAMUSCULAR | Status: DC | PRN
  Administered 2011-10-23: 4 mg via INTRAVENOUS
  Filled 2011-10-13: qty 2

## 2011-10-13 MED ORDER — SODIUM CHLORIDE 0.9 % IJ SOLN: 3.0000 mL | Freq: Two times a day (BID) | INTRAMUSCULAR | Status: DC

## 2011-10-13 MED ORDER — SODIUM CHLORIDE 0.9 % IV SOLN: 3.0000 g | INTRAVENOUS | Status: DC

## 2011-10-13 MED ORDER — VASOPRESSIN 20 UNIT/ML IJ SOLN
0.0300 [IU]/min | INTRAVENOUS | Status: DC | PRN
  Filled 2011-10-13: qty 2.5

## 2011-10-13 MED ORDER — PANTOPRAZOLE SODIUM 40 MG IV SOLR
40.0000 mg | INTRAVENOUS | Status: DC
  Administered 2011-10-13 – 2011-10-26 (×14): 40 mg via INTRAVENOUS
  Filled 2011-10-13 (×16): qty 40

## 2011-10-13 MED ORDER — SODIUM CHLORIDE 0.9 % IV BOLUS (SEPSIS): 500.0000 mL | Freq: Once | INTRAVENOUS | Status: DC

## 2011-10-13 MED ORDER — NOREPINEPHRINE BITARTRATE 1 MG/ML IJ SOLN
2.0000 ug/min | INTRAVENOUS | Status: DC | PRN
  Filled 2011-10-13: qty 4

## 2011-10-13 MED ORDER — SODIUM CHLORIDE 0.9 % IV SOLN
INTRAVENOUS | Status: DC
  Administered 2011-10-13: 17:00:00 via INTRAVENOUS

## 2011-10-13 MED ORDER — SODIUM CHLORIDE 0.9 % IV BOLUS (SEPSIS): 500.0000 mL | INTRAVENOUS | Status: DC | PRN

## 2011-10-13 MED ORDER — DEXTROSE 5 % IV SOLN
1.0000 g | Freq: Three times a day (TID) | INTRAVENOUS | Status: DC
  Administered 2011-10-13 – 2011-10-16 (×9): 1 g via INTRAVENOUS
  Filled 2011-10-13 (×10): qty 1

## 2011-10-13 MED ORDER — ONDANSETRON HCL 4 MG/2ML IJ SOLN: 4.0000 mg | Freq: Three times a day (TID) | INTRAMUSCULAR | Status: DC | PRN

## 2011-10-13 MED ORDER — METRONIDAZOLE IN NACL 5-0.79 MG/ML-% IV SOLN: 500.0000 mg | Freq: Once | INTRAVENOUS | Status: DC

## 2011-10-13 MED ORDER — INSULIN ASPART 100 UNIT/ML ~~LOC~~ SOLN: 0.0000 [IU] | Freq: Three times a day (TID) | SUBCUTANEOUS | Status: DC

## 2011-10-13 MED ORDER — METRONIDAZOLE IN NACL 5-0.79 MG/ML-% IV SOLN
500.0000 mg | Freq: Three times a day (TID) | INTRAVENOUS | Status: DC
  Administered 2011-10-13 – 2011-10-16 (×10): 500 mg via INTRAVENOUS
  Filled 2011-10-13 (×13): qty 100

## 2011-10-13 NOTE — H&P (Signed)
[name] is an 78 y.o. female.   PCP - Dr.B.Patel. History obtained from ER Physician and Old chart. Discussed with Patients grandson over the phone.  Chief Complaint: Fever. HPI: 78 year old female with history of perforated prepyloric gastric ulcer status post surgery and PEG tube placement, diabetes mellitus2, hypertension was brought to the ER because of fever. There was a concern about patient's PICC line being infected. Patient in the ER was found to be hypotensive with high lactic acid levels and eventually was found to have high procalcitonin levels too. Critical care was initially consulted by the ER physician. At this time as patient's blood pressure was responding to fluids critical care has requested hospitalist admission. Patient in the ER is found to be very confused to give any definite history. When I examined the patient she does not look to be in distress. Labs reveal patient may have had a UTI. Patient has already received 5 L normal saline. Patient will be admitted for sepsis source most likely urinary tract/abdomen. The PICC line site does not look infected but is displaced.  Past Medical History  Diagnosis Date  . Hypertension   . Reflux   . Right elbow pain     OTIF  . Dementia   . Depression   . Osteoarthritis   . Pancreatitis 11/2007    HOP  . Angiomyolipoma of kidney     right  . Ulcerative esophagitis 12/05/2007    hx elevated gastrin, severe on EGD by Dr Jena Gauss , h pylori negative  . Hiatal hernia   . S/P colonoscopy 2009    pt reports normal by Dr Lovell Sheehan  . Diabetes mellitus   . Anemia   . Hyponatremia   . Cellulitis   . Esophagitis   . Peptic ulcer   . Insomnia     Past Surgical History  Procedure Date  . Orif right hip 1999    [facility]  . Umbilical hernia repair 69 months old    [place]  . Esophagogastroduodenoscopy 01/28/2011    Procedure: ESOPHAGOGASTRODUODENOSCOPY (EGD);  Surgeon: Arlyce Harman, MD;  Location: [facility];  Service: Endoscopy;   Laterality: N/A;  . Colonoscopy 01/28/2011    Procedure: COLONOSCOPY;  Surgeon: Arlyce Harman, MD;  Location: [facility];  Service: Endoscopy;  Laterality: N/A;  . Laparotomy 02/04/2011    Procedure: EXPLORATORY LAPAROTOMY;  Surgeon: Fabio Bering;  Location: [facility];  Service: General;  Laterality: N/A;  . Laparotomy 05/07/2011    Procedure: EXPLORATORY LAPAROTOMY;  Surgeon: Ardeth Sportsman, MD;  Location: [facility] OR;  Service: General;  Laterality: N/A;  lysis of adhesions  . Gastrostomy 05/07/2011    Procedure: GASTROSTOMY;  Surgeon: Ardeth Sportsman, MD;  Location: [facility] OR;  Service: General;  Laterality: N/A;  g-tube / j -tube placement  . Gastrectomy 05/07/2011    Procedure: GASTRECTOMY;  Surgeon: Ardeth Sportsman, MD;  Location: [facility] OR;  Service: General;  Laterality: N/A;  Partial gastrectomy  . Cholecystectomy 05/07/2011    Procedure: CHOLECYSTECTOMY;  Surgeon: Ardeth Sportsman, MD;  Location: [facility] OR;  Service: General;  Laterality: N/A;  open    Family History  Problem Relation Age of Onset  . Cancer Mother     pelvic    Social History:  reports that she has never smoked. Her smokeless tobacco use includes Chew. She reports that she does not drink alcohol or use illicit drugs.  Allergies:  Allergies  Allergen Reactions  . Other Swelling  Pinto beans cause swelling and hives  . Penicillins Swelling    Medications Prior to Admission  Medication Dose Route Frequency Provider Last Rate Last Dose  . 0.9 %  sodium chloride infusion   Intravenous Continuous Dayton Bailiff, MD 125 mL/hr at 10/12/11 2037    . 0.9 %  sodium chloride infusion   Intravenous STAT Dayton Bailiff, MD      . acetaminophen (TYLENOL) suppository 650 mg  650 mg Rectal Once Dayton Bailiff, MD   650 mg at 10/12/11 1927  . ceFEPIme (MAXIPIME) 2 g in dextrose 5 % 50 mL IVPB  2 g Intravenous To Minor Dayton Bailiff, MD   2 g at 10/12/11 2035  . ondansetron (ZOFRAN) injection 4 mg  4 mg Intravenous Once Dayton Bailiff, MD   4 mg at  10/12/11 2033  . ondansetron (ZOFRAN) injection 4 mg  4 mg Intravenous Q8H PRN Dayton Bailiff, MD      . sodium chloride 0.9 % bolus 1,000 mL  1,000 mL Intravenous Once Dayton Bailiff, MD   1,000 mL at 10/12/11 2030  . sodium chloride 0.9 % bolus 1,000 mL  1,000 mL Intravenous Once Dayton Bailiff, MD   1,000 mL at 10/12/11 2053  . sodium chloride 0.9 % bolus 1,000 mL  1,000 mL Intravenous Once Dayton Bailiff, MD   1,000 mL at 10/12/11 2152  . sodium chloride 0.9 % bolus 1,000 mL  1,000 mL Intravenous Once Dayton Bailiff, MD   1,000 mL at 10/12/11 2152  . vancomycin (VANCOCIN) IVPB 1000 mg/200 mL premix  1,000 mg Intravenous Once Dayton Bailiff, MD   1,000 mg at 10/12/11 2127   Medications Prior to Admission  Medication Sig Dispense Refill  . acetaminophen (TYLENOL) 500 MG tablet Take 500 mg by mouth daily as needed. For pain      . amLODipine (NORVASC) 5 MG tablet Take 5 mg by mouth daily.      . benazepril (LOTENSIN) 20 MG tablet Take 20 mg by mouth daily.       . butalbital-acetaminophen-caffeine (FIORICET, ESGIC) 50-325-40 MG per tablet Take 2 tablets by mouth every 8 (eight) hours as needed. For headache      . calcium-vitamin D (OSCAL WITH D) 500-200 MG-UNIT per tablet Take 1 tablet by mouth daily.       . cloNIDine (CATAPRES - DOSED IN MG/24 HR) 0.1 mg/24hr patch Place 1 patch onto the skin once a week. Change on Mondays      . darifenacin (ENABLEX) 15 MG 24 hr tablet Take 15 mg by mouth daily.       . insulin aspart (NOVOLOG) 100 UNIT/ML injection Inject 3 Units into the skin 3 (three) times daily before meals. Sliding scale-give 3 units sq for CBG >150      . Loperamide HCl (IMODIUM PO) Take 2 mg by mouth every 4 (four) hours as needed. For diarrhea      . metFORMIN (GLUCOPHAGE) 1000 MG tablet Take 1,000 mg by mouth daily with breakfast.       . metoprolol tartrate (LOPRESSOR) 12.5 mg TABS Take 12.5 mg by mouth 2 (two) times daily.      . Multiple Vitamin (MULITIVITAMIN WITH MINERALS) TABS Take 1 tablet  by mouth daily.      . ondansetron (ZOFRAN) 4 MG tablet Take 4 mg by mouth every 8 (eight) hours as needed. For nausea       . oxyCODONE (ROXICODONE) 5 MG/5ML solution Take 5 mg by mouth every 4 (four)  hours as needed. For pain      . pantoprazole (PROTONIX) 40 MG tablet Take 40 mg by mouth 2 (two) times daily before a meal.      . PRESCRIPTION MEDICATION Inject into the vein continuous. Home TPN.  Patient gets supply from [facility] [phone]).      . QUEtiapine (SEROQUEL) 25 MG tablet Take 25 mg by mouth at bedtime.         Results for orders placed during the hospital encounter of 10/12/11 (from the past 48 hour(s))  GLUCOSE, CAPILLARY     Status: Abnormal   Collection Time   10/12/11  7:20 PM      Component Value Range Comment   Glucose-Capillary 208 (*) 70 - 99 (mg/dL)    Comment 1 Documented in Chart      Comment 2 Notify RN     URINALYSIS, ROUTINE W REFLEX MICROSCOPIC     Status: Abnormal   Collection Time   10/12/11  7:52 PM      Component Value Range Comment   Color, Urine YELLOW  YELLOW     APPearance TURBID (*) CLEAR     Specific Gravity, Urine >1.030 (*) 1.005 - 1.030     pH 5.5  5.0 - 8.0     Glucose, UA NEGATIVE  NEGATIVE (mg/dL)    Hgb urine dipstick LARGE (*) NEGATIVE     Bilirubin Urine SMALL (*) NEGATIVE     Ketones, ur NEGATIVE  NEGATIVE (mg/dL)    Protein, ur 098 (*) NEGATIVE (mg/dL)    Urobilinogen, UA 1.0  0.0 - 1.0 (mg/dL)    Nitrite NEGATIVE  NEGATIVE     Leukocytes, UA MODERATE (*) NEGATIVE    URINE MICROSCOPIC-ADD ON     Status: Abnormal   Collection Time   10/12/11  7:52 PM      Component Value Range Comment   Squamous Epithelial / LPF RARE  RARE     WBC, UA 3-6  <3 (WBC/hpf)    RBC / HPF TOO NUMEROUS TO COUNT  <3 (RBC/hpf)    Bacteria, UA FEW (*) RARE     Casts GRANULAR CAST (*) NEGATIVE     Urine-Other AMORPHOUS URATES/PHOSPHATES     CBC     Status: Abnormal   Collection Time   10/12/11  8:15 PM      Component Value Range Comment    WBC 15.1 (*) 4.0 - 10.5 (K/uL)    RBC 3.82 (*) 3.87 - 5.11 (MIL/uL)    Hemoglobin 10.2 (*) 12.0 - 15.0 (g/dL)    HCT 11.9 (*) 14.7 - 46.0 (%)    MCV 80.4  78.0 - 100.0 (fL)    MCH 26.7  26.0 - 34.0 (pg)    MCHC 33.2  30.0 - 36.0 (g/dL)    RDW 82.9 (*) 56.2 - 15.5 (%)    Platelets 236  150 - 400 (K/uL)   DIFFERENTIAL     Status: Abnormal   Collection Time   10/12/11  8:15 PM      Component Value Range Comment   Neutrophils Relative 88 (*) 43 - 77 (%)    Neutro Abs 13.3 (*) 1.7 - 7.7 (K/uL)    Lymphocytes Relative 6 (*) 12 - 46 (%)    Lymphs Abs 0.9  0.7 - 4.0 (K/uL)    Monocytes Relative 6  3 - 12 (%)    Monocytes Absolute 0.9  0.1 - 1.0 (K/uL)    Eosinophils Relative 0  0 -  5 (%)    Eosinophils Absolute 0.0  0.0 - 0.7 (K/uL)    Basophils Relative 0  0 - 1 (%)    Basophils Absolute 0.0  0.0 - 0.1 (K/uL)   COMPREHENSIVE METABOLIC PANEL     Status: Abnormal   Collection Time   10/12/11  8:15 PM      Component Value Range Comment   Sodium 129 (*) 135 - 145 (mEq/L)    Potassium 4.8  3.5 - 5.1 (mEq/L)    Chloride 93 (*) 96 - 112 (mEq/L)    CO2 18 (*) 19 - 32 (mEq/L)    Glucose, Bld 216 (*) 70 - 99 (mg/dL)    BUN 38 (*) 6 - 23 (mg/dL)    Creatinine, Ser 1.61 (*) 0.50 - 1.10 (mg/dL)    Calcium 8.9  8.4 - 10.5 (mg/dL)    Total Protein 7.2  6.0 - 8.3 (g/dL)    Albumin 2.6 (*) 3.5 - 5.2 (g/dL)    AST 47 (*) 0 - 37 (U/L)    ALT 40 (*) 0 - 35 (U/L)    Alkaline Phosphatase 174 (*) 39 - 117 (U/L)    Total Bilirubin 0.3  0.3 - 1.2 (mg/dL)    GFR calc non Af Amer 38 (*) >90 (mL/min)    GFR calc Af Amer 44 (*) >90 (mL/min)   PROCALCITONIN     Status: Normal   Collection Time   10/12/11  8:15 PM      Component Value Range Comment   Procalcitonin 59.17     LACTIC ACID, PLASMA     Status: Abnormal   Collection Time   10/12/11  8:19 PM      Component Value Range Comment   Lactic Acid, Venous 4.0 (*) 0.5 - 2.2 (mmol/L)    Dg Chest Port 1 View  10/12/2011  *RADIOLOGY REPORT*  Clinical  Data: Fever.  Drainage about a PICC.  PORTABLE CHEST - 1 VIEW  Comparison: Fluoroscopic spot view from PICC placement 09/29/2011. Plain films of the chest 07/28/2011.  CT chest 01/26/2011.  Findings: The patient's right PICC has backed out with the tip in the right axilla.  Lungs are clear.  Heart size is normal.  No pneumothorax or pleural effusion.  IMPRESSION:  1.  Tip of the patient's right PICC is now in the right axilla. 2.  No acute cardiopulmonary disease.  Original Report Authenticated By: Bernadene Bell. Maricela Curet, M.D.    Review of Systems  Constitutional: Positive for fever.  HENT: Negative.   Eyes: Negative.   Respiratory: Negative.   Cardiovascular: Negative.   Gastrointestinal: Negative.   Genitourinary: Negative.   Musculoskeletal: Negative.   Skin: Negative.   Neurological: Negative.   Endo/Heme/Allergies: Negative.     Blood pressure 111/92, pulse 121, temperature 100.8 F (38.2 C), temperature source Core (Comment), resp. rate 20, SpO2 100.00%. Physical Exam  Constitutional: She appears well-developed and well-nourished. No distress.  HENT:  Head: Normocephalic and atraumatic.  Right Ear: External ear normal.  Left Ear: External ear normal.  Mouth/Throat: No oropharyngeal exudate.  Eyes: Conjunctivae are normal. Pupils are equal, round, and reactive to light. Right eye exhibits no discharge. Left eye exhibits no discharge.  Neck: Normal range of motion. Neck supple.  Cardiovascular: Normal rate and regular rhythm.   Respiratory: Effort normal and breath sounds normal. No respiratory distress. She has no wheezes. She has no rales.  GI: Soft. Bowel sounds are normal. She exhibits no distension. There is no tenderness. There  is no rebound.       Peg tube in place.No skin excoriations around the Peg tube.  Musculoskeletal: Normal range of motion. She exhibits no edema and no tenderness.       PICC line in right arm. I don't see any obvious skin excoriations around the PICC  line or I don't see any discharge from the PICC line.  Neurological: She is alert.       Only oriented to her name.  Skin: She is not diaphoretic.     Assessment/Plan #1. Sepsis most likely source intra-abdominal/UTI - patient has been started on vancomycin and cefepime. We will continue with vancomycin and Zosyn and Flagyl. Flagyl is being added as patient has history of C. difficile colitis. Blood cultures and urine cultures have been sent. If patient has diarrhea we have to look for C. difficile colitis. The PICC line is placed not used. The PICC line site and the PEG tube sites looks clean. Continue aggressive hydration. CT abdomen and pelvis has been ordered to rule out any intra-abdominal source. I am also ordering to discontinue the PICC line. #2. Acute renal failure - patient's creatinine has increased from baseline probably related to the hypotension and dehydration. I think this will improve with hydration. Closely follow intake and output. #3. Encephalopathy - patient was admitted in February 2013. And at that time reviewing her H&P it looks like patient was alert and oriented. At this time patient is completely confused probably related to her sepsis. Closely follow patient's mental status. #4. History of hypertension presently hypotensive so we will hold all antihypertensives. #5. Diabetes mellitus2 - as I have kept patient n.p.o. we will check CBGs every 4 hours with sliding scale coverage. #6. Anemia - probably chronic, closely follow CBC.  CODE STATUS - full code.   Chistopher Mangino N. 10/13/2011, 2:59 AM

## 2011-10-13 NOTE — Consult Note (Signed)
Name: RYLAN KAUFMANN MRN: [id number] DOB: 02-17-1932    LOS: 1    History of Present Illness:  78 year old female with PMH perforated duodenal ulcer, status post antrectomy, duodenal resection, Billroth 2 reconstruction, omental patching of Duodenal bulb, open cholecystectomy, gastrostomy and jejunostomy on 05/07/11. Her course was complicated by epigastric abscess requiring percutaneous drain per IR 05/31/11. At discharge she was noted to have malnutrition and short-gut syndrome requiring chronic TNA. Also most recently treated for C diff 07/2011. Brought to the ER on 4/15 w/ fever and purulent discharge from PICC insertion site, abd pain and UA concerning for UTI. On presentation was hypotensive and confused. Her BP  Initially responded to IVFs. She was started on antibiotics, PICC line was removed, and she was admitted to SDU. Later in the afternoon on 4/16 she again became hypotensive w/ SBP in 40s-60s (had already gotten 5 liters in ER of IVFs). PCCM was asked to see.   Lines / Drains:  Cultures: cdiff + (back on 2/17) mrsa pcr 4/16: negative bcx2 4/15 1/2 showing gpc and gpc chains picc tip 4/16>>> PCT 4/15: 59.17  Antibiotics: vanc (Infected PICC)4/16>>> Flagyl (h/o cdiff) 4/16>>> azactam (possible UTI) 4/16>>>  Tests / Events: CT abd/pelvis 4/16: Postsurgical changes in the upper abdomen. Residual low density  fluid or edema around the liver has minimally changed since 07/29/2011.  Tiny pleural effusions with bibasilar atelectasis RUE [place] 4/16>>> 2 view abd 4/16>>>  The patient is confused and unable to provide history, which was obtained for available medical records.    Past Medical History  Diagnosis Date  . Hypertension   . Reflux   . Right elbow pain     OTIF  . Dementia   . Depression   . Osteoarthritis   . Pancreatitis 11/2007    HOP  . Angiomyolipoma of kidney     right  . Ulcerative esophagitis 12/05/2007    hx elevated gastrin, severe on EGD by Dr  Jena Gauss , h pylori negative  . Hiatal hernia   . S/P colonoscopy 2009    pt reports normal by Dr Lovell Sheehan  . Diabetes mellitus   . Anemia   . Hyponatremia   . Cellulitis   . Esophagitis   . 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 Her course was complicated by epigastric abscess requiring percutaneous drain per IR 05/31/11.     Bacteremia 07/2011   . Insomnia    Past Surgical History  Procedure Date  . Orif right hip 1999    [facility]  . Umbilical hernia repair 9 months old    [place]  . Esophagogastroduodenoscopy 01/28/2011    Procedure: ESOPHAGOGASTRODUODENOSCOPY (EGD);  Surgeon: Arlyce Harman, MD;  Location: [facility];  Service: Endoscopy;  Laterality: N/A;  . Colonoscopy 01/28/2011    Procedure: COLONOSCOPY;  Surgeon: Arlyce Harman, MD;  Location: [facility];  Service: Endoscopy;  Laterality: N/A;  . Laparotomy 02/04/2011    Procedure: EXPLORATORY LAPAROTOMY;  Surgeon: Fabio Bering;  Location: [facility] ORS;  Service: General;  Laterality: N/A;  . Laparotomy 05/07/2011    Procedure: EXPLORATORY LAPAROTOMY;  Surgeon: Ardeth Sportsman, MD;  Location: [facility] OR;  Service: General;  Laterality: N/A;  lysis of adhesions  . Gastrostomy 05/07/2011    Procedure: GASTROSTOMY;  Surgeon: Ardeth Sportsman, MD;  Location: [facility] OR;  Service: General;  Laterality: N/A;  g-tube / j -tube placement  . Gastrectomy 05/07/2011    Procedure: GASTRECTOMY;  Surgeon: Ardeth Sportsman, MD;  Location: [facility] OR;  Service: General;  Laterality: N/A;  Partial gastrectomy  . Cholecystectomy 05/07/2011    Procedure: CHOLECYSTECTOMY;  Surgeon: Ardeth Sportsman, MD;  Location: [facility] OR;  Service: General;  Laterality: N/A;  open    Prior to Admission medications   Medication Sig Start Date End Date Taking? Authorizing Provider  acetaminophen (TYLENOL) 500 MG tablet Take 500 mg by mouth daily as needed. For pain 04/10/11 04/09/12 Yes Kerri Perches, MD  amLODipine (NORVASC) 5 MG tablet Take 5 mg by mouth daily.   Yes Salley Scarlet, MD  benazepril (LOTENSIN) 20 MG tablet Take 20 mg by mouth daily.    Yes Historical Provider, MD  butalbital-acetaminophen-caffeine (FIORICET, ESGIC) 50-325-40 MG per tablet Take 2 tablets by mouth every 8 (eight) hours as needed. For headache 08/18/11 08/17/12 Yes Laveda Norman, MD  calcium-vitamin D (OSCAL WITH D) 500-200 MG-UNIT per tablet Take 1 tablet by mouth daily.    Yes Historical Provider, MD  cloNIDine (CATAPRES - DOSED IN MG/24 HR) 0.1 mg/24hr patch Place 1 patch onto the skin once a week. Change on Mondays 07/06/11 07/05/12 Yes Meredeth Ide, MD  darifenacin (ENABLEX) 15 MG 24 hr tablet Take 15 mg by mouth daily.    Yes Historical Provider, MD  ergocalciferol (VITAMIN D2) 50000 UNITS capsule Take 50,000 Units by mouth once a week.   Yes Historical Provider, MD  insulin aspart (NOVOLOG) 100 UNIT/ML injection Inject 3 Units into the skin 3 (three) times daily before meals. Sliding scale-give 3 units sq for CBG >150 07/06/11 07/05/12 Yes Meredeth Ide, MD  insulin regular (NOVOLIN R,HUMULIN R) 100 units/mL injection Inject 65 Units into the skin daily.   Yes Historical Provider, MD  Loperamide HCl (IMODIUM PO) Take 2 mg by mouth every 4 (four) hours as needed. For diarrhea   Yes Historical Provider, MD  metFORMIN (GLUCOPHAGE) 1000 MG tablet Take 1,000 mg by mouth daily with breakfast.    Yes Historical Provider, MD  metoprolol tartrate (LOPRESSOR) 12.5 mg TABS Take 12.5 mg by mouth 2 (two) times daily. 07/06/11  Yes Meredeth Ide, MD  Multiple Vitamin (MULITIVITAMIN WITH MINERALS) TABS Take 1 tablet by mouth daily.   Yes Historical Provider, MD  ondansetron (ZOFRAN) 4 MG tablet Take 4 mg by mouth  every 8 (eight) hours as needed. For nausea    Yes Historical Provider, MD  oxyCODONE (ROXICODONE) 5 MG/5ML solution Take 5 mg by mouth every 4 (four) hours as needed. For pain 08/18/11  Yes Laveda Norman, MD  pantoprazole (PROTONIX) 40 MG tablet Take 40 mg by mouth 2 (two) times daily before a meal.   Yes Historical Provider, MD  PRESCRIPTION MEDICATION Inject into the vein continuous. Home TPN.  Patient gets supply from [facility] [phone]).   Yes Historical Provider, MD  QUEtiapine (SEROQUEL) 25 MG tablet Take 25 mg by mouth at bedtime.    Yes Historical Provider, MD  traZODone (DESYREL) 50 MG tablet Take 25-50 mg by mouth at bedtime as needed. For sleep   Yes Historical Provider, MD   Allergies Allergies  Allergen Reactions  . Other Swelling    Claris Gladden  beans cause swelling and hives  . Penicillins Swelling    Family History Family History  Problem Relation Age of Onset  . Cancer Mother     pelvic     Social History  reports that she has never smoked. Her smokeless tobacco use includes Chew. She reports that she does not drink alcohol or use illicit drugs.  Review Of Systems  Unable, confused.  Vital Signs: BP 86/42  Pulse 111  Temp(Src) 102.5 F (39.2 C) (Oral)  Resp 26  Ht 5' 4.17" (1.63 m)  Wt 80.5 kg (177 lb 7.5 oz)  BMI 30.30 kg/m2  SpO2 93%       . sodium chloride 999 mL/hr at 10/13/11 1420  . DISCONTD: sodium chloride 125 mL/hr at 10/12/11 2037     Intake/Output Summary (Last 24 hours) at 10/13/11 1451 Last data filed at 10/13/11 1332  Gross per 24 hour  Intake   6220 ml  Output   1350 ml  Net   4870 ml    Physical Examination: General:  Chronically ill appearing elderly female. Confused but not in distress.  Neuro:  Oriented to self. Has generalized weakness.  HEENT:  Mucous membranes dry, no JVD Neck:  NV flat Cardiovascular:  rrr Lungs: occational rhonchi Abdomen:  PEG unremarkable. Reports pain to palp. + bowel  sounds Musculoskeletal:  intact Skin:  Intact GU: f/c in place.   Ventilator settings:    Labs and Imaging:   Lab 10/13/11 0530 10/12/11 2015  NA 135 129*  K 4.3 4.8  CL 106 93*  CO2 19 18*  BUN 30* 38*  CREATININE 1.02 1.31*  GLUCOSE 146* 216*    Lab 10/13/11 0530 10/12/11 2015  HGB 8.1* 10.2*  HCT 25.0* 30.7*  WBC 13.3* 15.1*  PLT 209 236    Assessment and Plan: Septic shock, most likely due to Bacteremia due to gram positive rods and cocci from infected RUE PICC (removed on 4/16). She is now status post over 5 L of crystalloid resuscitation and he once again is hypotensive. She has limited IV access, currently her resuscitation is in her left thumb. Given her right upper extremity swelling also worry about abscess, versus infected clot, versus phlebitis.  Plan: -Transferred to the intensive care place and critical care service -Continue broad-spectrum antibiotics, infectious disease is involved in her case and we will defer selection to them -Initiate goal-directed therapy -Aggressive IV fluid resuscitation efforts -Will need vasoactive drips to maintain a mean arterial pressure greater than 65   Internal gastroileal fistula with functional short gut syndrome, on chronic TNA Plan- -Nutritional consult -Will likely need to resume TNA  Abd pain: etiology unclear. CT abd negative. But states getting worse Plan: -check lipase, 2 V of abd -repeat lactate.  Acute renal failure:  better with IV fluid resuscitation efforts  Recent Labs  Basename 10/13/11 0530 10/12/11 2015   CREATININE 1.02 1.31*  plan: -Continue IV fluids, resuscitation goals to be outlined by early goal-directed therapy protocol -Strict I&O -Renal adjustments -Followup chemistry   Metabolic acidosis: Improving, this was initially a positive anion gap metabolic acidosis, her presenting lactate was 4, this cleared to 2.1 in the emergency room Plan: -continue IV fluids -Followup  chemistry   Hyponatremia:  improved with IV fluid resuscitation efforts   Lab 10/13/11 0530 10/12/11 2015  NA 135 129*  Plan: -cont IVFs  Acute encephalopathy in setting of sepsis Plan: -supportive care  DIABETES MELLITUS, TYPE II  CBG (last 3)   Basename  10/13/11 1324 10/13/11 0828 10/13/11 0344  GLUCAP 116* 120* 149*  plan: -ssi  C. difficile colitis- recent/still under treatment (07/2011) Plan: recheck, will cont flagyl for now UTI (lower urinary tract infection)   Swelling of limb, right upper extremity where PICC was.  Plan: [place] right Upper ext. R/O DVT, also absess.   Best practices / Disposition: -->ICU status under PCCM -->full code -->Heparin for DVT Px -->Protonix for GI Px -->ventilator bundle -->diet -->family updated at bedside  The patient is critically ill with multiple organ systems failure and requires high complexity decision making for assessment and support, frequent evaluation and titration of therapies, application of advanced monitoring technologies and extensive interpretation of multiple databases. Critical Care Time devoted to patient care services described in this note is 45 minutes.  BABCOCK,PETE 10/13/2011, 2:51 PM  Patient likely has septic shock from right arm PICC line with cellulitis and line infection.  Patient was fluid resuscitated but developed additional septic shock with ? Of cellulitis, thrombophlebitis vs abscess.  Patient is delirious and belly pain is questionable.  Will order U/S studies of the RUQ.  Abd CT without hydro but UTI is a possibility.  Will place TLC and start septic shock protocol.  Abx per ID.  CC time 60 min.  Patient seen and examined, agree with above note.  I dictated the care and orders written for this patient under my direction.  Koren Bound, M.D. [phone]

## 2011-10-13 NOTE — Progress Notes (Signed)
Utilization Review Completed.[name] T4/16/2013   

## 2011-10-13 NOTE — Progress Notes (Signed)
Patient transferred to 2110.

## 2011-10-13 NOTE — ED Notes (Signed)
Transported to 6500 via stretcher with portable monitor

## 2011-10-13 NOTE — Significant Event (Signed)
Bedside US RUE.  Prelim: no obvious abscess. Does appear be venous clot.  Plan: -check formal US of UE.   Patient seen and examined, agree with above note.  I dictated the care and orders written for this patient under my direction.  Koren Bound, M.D. [phone]

## 2011-10-13 NOTE — Progress Notes (Signed)
eLink Physician-Brief Progress Note Patient Name: [name] DOB: Oct 31, 1931 MRN: [id number]  Date of Service  10/13/2011   HPI/Events of Note   Not on sup, coagulopathic, septic  eICU Interventions  ppi Rx   Intervention Category Intermediate Interventions: Best-practice therapies (e.g. DVT, beta blocker, etc.)  Mary Secord 10/13/2011, 9:48 PM

## 2011-10-13 NOTE — Progress Notes (Signed)
ANTIBIOTIC CONSULT NOTE - INITIAL  Pharmacy Consult for vancomcyin, flagyl and azactam  Indication: sepsis  Allergies  Allergen Reactions  . Other Swelling    Pinto beans cause swelling and hives  . Penicillins Swelling    Patient Measurements: Height: 5' 4.17" (163 cm) Weight: 177 lb 7.5 oz (80.5 kg) IBW/kg (Calculated) : 55.1  Adjusted Body Weight:   Vital Signs: Temp: 98.8 F (37.1 C) (04/16 0437) Temp src: Oral (04/16 0437) BP: 114/47 mmHg (04/16 0437) Pulse Rate: 96  (04/16 0437) Intake/Output from previous day: 04/15 0701 - 04/16 0700 In: 5250 [I.V.:5250] Out: 350 [Urine:350] Intake/Output from this shift: Total I/O In: 5250 [I.V.:5250] Out: 350 [Urine:350]  Labs:  [facility] 10/12/11 2015  WBC 15.1*  HGB 10.2*  PLT 236  LABCREA --  CREATININE 1.31*   Estimated Creatinine Clearance: 36.5 ml/min (by C-G formula based on Cr of 1.31). No results found for this basename: VANCOTROUGH:2,VANCOPEAK:2,VANCORANDOM:2,GENTTROUGH:2,GENTPEAK:2,GENTRANDOM:2,TOBRATROUGH:2,TOBRAPEAK:2,TOBRARND:2,AMIKACINPEAK:2,AMIKACINTROU:2,AMIKACIN:2, in the last 72 hours   Microbiology: No results found for this or any previous visit (from the past 720 hour(s)).  Medical History: Past Medical History  Diagnosis Date  . Hypertension   . Reflux   . Right elbow pain     OTIF  . Dementia   . Depression   . Osteoarthritis   . Pancreatitis 11/2007    [facility]OP  . Angiomyolipoma of kidney     right  . Ulcerative esophagitis 12/05/2007    hx elevated gastrin, severe on EGD by Dr Jena Gauss , h pylori negative  . Hiatal hernia   . S/P colonoscopy 2009    pt reports normal by Dr Lovell Sheehan  . Diabetes mellitus   . Anemia   . Hyponatremia   . Cellulitis   . Esophagitis   . Peptic ulcer   . Insomnia     Medications:  Prescriptions prior to admission  Medication Sig Dispense Refill  . acetaminophen (TYLENOL) 500 MG tablet Take 500 mg by mouth daily as needed. For pain      . amLODipine  (NORVASC) 5 MG tablet Take 5 mg by mouth daily.      . benazepril (LOTENSIN) 20 MG tablet Take 20 mg by mouth daily.       . butalbital-acetaminophen-caffeine (FIORICET, ESGIC) 50-325-40 MG per tablet Take 2 tablets by mouth every 8 (eight) hours as needed. For headache      . calcium-vitamin D (OSCAL WITH D) 500-200 MG-UNIT per tablet Take 1 tablet by mouth daily.       . cloNIDine (CATAPRES - DOSED IN MG/24 HR) 0.1 mg/24hr patch Place 1 patch onto the skin once a week. Change on Mondays      . darifenacin (ENABLEX) 15 MG 24 hr tablet Take 15 mg by mouth daily.       . ergocalciferol (VITAMIN D2) 50000 UNITS capsule Take 50,000 Units by mouth once a week.      . insulin aspart (NOVOLOG) 100 UNIT/ML injection Inject 3 Units into the skin 3 (three) times daily before meals. Sliding scale-give 3 units sq for CBG >150      . insulin regular (NOVOLIN R,HUMULIN R) 100 units/mL injection Inject 65 Units into the skin daily.      . Loperamide HCl (IMODIUM PO) Take 2 mg by mouth every 4 (four) hours as needed. For diarrhea      . metFORMIN (GLUCOPHAGE) 1000 MG tablet Take 1,000 mg by mouth daily with breakfast.       . metoprolol tartrate (LOPRESSOR) 12.5 mg TABS  Take 12.5 mg by mouth 2 (two) times daily.      . Multiple Vitamin (MULITIVITAMIN WITH MINERALS) TABS Take 1 tablet by mouth daily.      . ondansetron (ZOFRAN) 4 MG tablet Take 4 mg by mouth every 8 (eight) hours as needed. For nausea       . oxyCODONE (ROXICODONE) 5 MG/5ML solution Take 5 mg by mouth every 4 (four) hours as needed. For pain      . pantoprazole (PROTONIX) 40 MG tablet Take 40 mg by mouth 2 (two) times daily before a meal.      . PRESCRIPTION MEDICATION Inject into the vein continuous. Home TPN.  Patient gets supply from [facility] [phone]).      . QUEtiapine (SEROQUEL) 25 MG tablet Take 25 mg by mouth at bedtime.       . traZODone (DESYREL) 50 MG tablet Take 25-50 mg by mouth at bedtime as needed. For sleep        Assessment: 78 yo female with sepsis. Pt has responded to 5 L of fluid repletion. Has peg tube placement s/p pre-pyloric gastric ulcer. PICC line as well as peg tube site look clean with no excoriations or exudates. Sepsis source is most likely intrabdominal vs UTI requiring broad abx cvg with vanc and zosyn. Received 2 gm cefepime in ED  Goal of Therapy:  Vancomycin trough level 15-20 mcg/ml  Plan:  Pt is allergic to penicillins (swelling) contacting md for alternative  Vancomycin 1250 mg q12 hours (please note this is based on dose [facility] feb [facility] that was 1500-1750 q12h --see jennifer [place] note 2/19) adjusting this dose downward based on current renal function and critical dx. F/u trough before 4th dose  and cx's  Alternative chosen azactam 1gm q8h and flagyl 500 mg q8h (anaerobe cvg with intrabdominal source)   Janice Coffin 10/13/2011,5:03 AM

## 2011-10-13 NOTE — Progress Notes (Addendum)
ANTIBIOTIC CONSULT NOTE - INITIAL  Pharmacy Consult for vancomcyin and zosyn Indication: sepsis  Allergies  Allergen Reactions  . Other Swelling    Pinto beans cause swelling and hives  . Penicillins Swelling    Patient Measurements: Height: 5' 4.17" (163 cm) Weight: 177 lb 7.5 oz (80.5 kg) IBW/kg (Calculated) : 55.1  Adjusted Body Weight:   Vital Signs: Temp: 100 F (37.8 C) (04/16 0319) Temp src: Oral (04/16 0319) BP: 102/48 mmHg (04/16 0319) Pulse Rate: 121  (04/16 0220) Intake/Output from previous day: 04/15 0701 - 04/16 0700 In: 5250 [I.V.:5250] Out: 350 [Urine:350] Intake/Output from this shift: Total I/O In: 5250 [I.V.:5250] Out: 350 [Urine:350]  Labs:  [facility] 10/12/11 2015  WBC 15.1*  HGB 10.2*  PLT 236  LABCREA --  CREATININE 1.31*   Estimated Creatinine Clearance: 36.5 ml/min (by C-G formula based on Cr of 1.31). No results found for this basename: VANCOTROUGH:2,VANCOPEAK:2,VANCORANDOM:2,GENTTROUGH:2,GENTPEAK:2,GENTRANDOM:2,TOBRATROUGH:2,TOBRAPEAK:2,TOBRARND:2,AMIKACINPEAK:2,AMIKACINTROU:2,AMIKACIN:2, in the last 72 hours   Microbiology: No results found for this or any previous visit (from the past 720 hour(s)).  Medical History: Past Medical History  Diagnosis Date  . Hypertension   . Reflux   . Right elbow pain     OTIF  . Dementia   . Depression   . Osteoarthritis   . Pancreatitis 11/2007    HOP  . Angiomyolipoma of kidney     right  . Ulcerative esophagitis 12/05/2007    hx elevated gastrin, severe on EGD by Dr Jena Gauss , h pylori negative  . Hiatal hernia   . S/P colonoscopy 2009    pt reports normal by Dr Lovell Sheehan  . Diabetes mellitus   . Anemia   . Hyponatremia   . Cellulitis   . Esophagitis   . Peptic ulcer   . Insomnia     Medications:  Prescriptions prior to admission  Medication Sig Dispense Refill  . acetaminophen (TYLENOL) 500 MG tablet Take 500 mg by mouth daily as needed. For pain      . amLODipine (NORVASC) 5 MG  tablet Take 5 mg by mouth daily.      . benazepril (LOTENSIN) 20 MG tablet Take 20 mg by mouth daily.       . butalbital-acetaminophen-caffeine (FIORICET, ESGIC) 50-325-40 MG per tablet Take 2 tablets by mouth every 8 (eight) hours as needed. For headache      . calcium-vitamin D (OSCAL WITH D) 500-200 MG-UNIT per tablet Take 1 tablet by mouth daily.       . cloNIDine (CATAPRES - DOSED IN MG/24 HR) 0.1 mg/24hr patch Place 1 patch onto the skin once a week. Change on Mondays      . darifenacin (ENABLEX) 15 MG 24 hr tablet Take 15 mg by mouth daily.       . ergocalciferol (VITAMIN D2) 50000 UNITS capsule Take 50,000 Units by mouth once a week.      . insulin aspart (NOVOLOG) 100 UNIT/ML injection Inject 3 Units into the skin 3 (three) times daily before meals. Sliding scale-give 3 units sq for CBG >150      . insulin regular (NOVOLIN R,HUMULIN R) 100 units/mL injection Inject 65 Units into the skin daily.      . Loperamide HCl (IMODIUM PO) Take 2 mg by mouth every 4 (four) hours as needed. For diarrhea      . metFORMIN (GLUCOPHAGE) 1000 MG tablet Take 1,000 mg by mouth daily with breakfast.       . metoprolol tartrate (LOPRESSOR) 12.5 mg TABS Take 12.5  mg by mouth 2 (two) times daily.      . Multiple Vitamin (MULITIVITAMIN WITH MINERALS) TABS Take 1 tablet by mouth daily.      . ondansetron (ZOFRAN) 4 MG tablet Take 4 mg by mouth every 8 (eight) hours as needed. For nausea       . oxyCODONE (ROXICODONE) 5 MG/5ML solution Take 5 mg by mouth every 4 (four) hours as needed. For pain      . pantoprazole (PROTONIX) 40 MG tablet Take 40 mg by mouth 2 (two) times daily before a meal.      . PRESCRIPTION MEDICATION Inject into the vein continuous. Home TPN.  Patient gets supply from [facility] [phone]).      . QUEtiapine (SEROQUEL) 25 MG tablet Take 25 mg by mouth at bedtime.       . traZODone (DESYREL) 50 MG tablet Take 25-50 mg by mouth at bedtime as needed. For sleep        Assessment: 78 yo female with sepsis. Pt has responded to 5 L of fluid repletion. Has peg tube placement s/p pre-pyloric gastric ulcer. PICC line as well as peg tube site look clean with no excoriations or exudates. Sepsis source is most likely intrabdominal vs UTI requiring broad abx cvg with vanc and zosyn. Received 2 gm cefepime in ED  Goal of Therapy:  Vancomycin trough level 15-20 mcg/ml  Plan:  Pt is allergic to penicillins (swelling) contacting md for alternative  Vancomycin 1250 mg q12 hours (please note this is based on dose [facility] feb [facility] that was 1500-1750 q12h --see jennifer [place] note 2/19) adjusting this dose downward based on current renal function and critical dx. F/u trough before 4th dose  and cx's  Janice Coffin 10/13/2011,4:31 AM

## 2011-10-13 NOTE — Progress Notes (Signed)
[facility]   Subjective:  Denies any diarrhea since admission. Endorses generalized malaise.  Objective: Vital signs in last 24 hours: Temp:  [98.8 F (37.1 C)-104.5 F (40.3 C)] 102.5 F (39.2 C) (04/16 1324) Pulse Rate:  [67-135] 111  (04/16 1324) Resp:  [14-32] 26  (04/16 1324) BP: (85-152)/(20-92) 86/42 mmHg (04/16 1324) SpO2:  [77 %-100 %] 93 % (04/16 1324) Weight:  [80.5 kg (177 lb 7.5 oz)] 80.5 kg (177 lb 7.5 oz) (04/16 0319) Weight change:     Intake/Output from previous day: 04/15 0701 - 04/16 0700 In: 5410 [I.V.:5410] Out: 350 [Urine:350] Intake/Output this shift: Total I/O In: 810 [P.O.:650; I.V.:160] Out: 1000 [Urine:1000]  General appearance: alert, appears stated age, mild distress and toxic, temperature maximum 104.5 over the past 4 hours with most recent temperature once again increased to 102.5 Resp: clear to auscultation bilaterally, on 3 L nasal cannula oxygen with O2 saturations initially in the 98-100% range now decreased to 93%. Cardio: regular rate and rhythm, S1, S2 normal, no murmur, click, rub or gallop, IV fluids at 150 cc per hour; systolic blood pressure has trended downward from the 110-120 range to 86 at most recent reading GI: soft, non-tender; bowel sounds normal; no masses,  no organomegaly, PEG tube in place, small abdominal wound in mid abdomend about 2 cm across, with yellowish discharge on dressing.  Extremities: extremities normal, atraumatic, no cyanosis or edema Neurologic: Grossly normal but sleepy  Lab Results:  Basename 10/13/11 0530 10/12/11 2015  WBC 13.3* 15.1*  HGB 8.1* 10.2*  HCT 25.0* 30.7*  PLT 209 236   BMET  Basename 10/13/11 0530 10/12/11 2015  NA 135 129*  K 4.3 4.8  CL 106 93*  CO2 19 18*  GLUCOSE 146* 216*  BUN 30* 38*  CREATININE 1.02 1.31*  CALCIUM 7.7* 8.9    Studies/Results: Ct Abdomen Pelvis W Contrast  10/13/2011  *RADIOLOGY REPORT*  Clinical Data: 78 year old with sepsis.  CT ABDOMEN  AND PELVIS WITH CONTRAST  Technique:  Multidetector CT imaging of the abdomen and pelvis was performed following the standard protocol during bolus administration of intravenous contrast.  Contrast: 80mL OMNIPAQUE IOHEXOL 300 MG/ML  SOLN  Comparison: 07/29/2011  Findings: There are tiny bilateral pleural effusions with basilar atelectasis.  A small amount of focal atelectasis or round atelectasis at the right lung base on sequence 3, image 7.  No evidence for free air.  Coronary artery calcifications.  The gallbladder has been removed. Again noted is low density fluid or postsurgical changes between the stomach and left hepatic lobe, best seen on sequence 2, image 23.  This area roughly measures 2.1 cm and similar to the prior examination.  There is residual fluid or edema in the gallbladder fossa and along the inferior liver margin.  No evidence for a drainable fluid collection.  Gastrostomy tube is present.  Again noted is a large calcification near the pancreatic head and neck. There is oral contrast within the stomach and large bowel.  No gross abnormality to the liver, portal venous system, adrenal glands, kidneys or spleen.  Again noted are low density structures within the kidneys which are too small to definitively characterize but could represent cysts.  Calcifications associated with the uterus and cannot exclude fibroids.  No significant free fluid in the pelvis.  There is a right hip replacement.  No significant lymphadenopathy. A Foley catheter within the urinary bladder.  IMPRESSION:  Postsurgical changes in the upper abdomen.  Residual low density fluid or  edema around the liver has minimally changed since 07/29/2011.  Tiny pleural effusions with bibasilar atelectasis.  Original Report Authenticated By: Richarda Overlie, M.D.   Dg Chest Port 1 View  10/12/2011  *RADIOLOGY REPORT*  Clinical Data: Fever.  Drainage about a PICC.  PORTABLE CHEST - 1 VIEW  Comparison: Fluoroscopic spot view from PICC placement  09/29/2011. Plain films of the chest 07/28/2011.  CT chest 01/26/2011.  Findings: The patient's right PICC has backed out with the tip in the right axilla.  Lungs are clear.  Heart size is normal.  No pneumothorax or pleural effusion.  IMPRESSION:  1.  Tip of the patient's right PICC is now in the right axilla. 2.  No acute cardiopulmonary disease.  Original Report Authenticated By: Bernadene Bell. Maricela Curet, M.D.    Medications: I have reviewed the patient's current medications.  Assessment/Plan:  Principal Problem:  *Sepsis *Likely primary source is bacteremia secondary to PICC line which had pus draining from around the insertion- PICC line was removed at admission *Also appears to have associated urinary tract infection *Despite broad-spectrum antibiotic coverage remains hemodynamically labile with recurrent fevers and relative hypotension - we are now concerned regarding progressive sepsis therefore we have consulted the pulmonary critical care medicine team *Procalcitonin level at presentation 59.17  Active Problems:  Acute renal failure *Improved after aggressive fluid resuscitation *Creatinine has decreased from 1.31-1.2 and GFR has increased from 38-51 *Continue hydration and follow electrolyte panel   Acute respiratory failure with hypoxia *Likely primary reflection of evolving sepsis process *CT scan shows only. Small bilateral pleural effusions and no apparent infiltrative or pneumonia process   Metabolic acidosis *Multifactorial secondary to acute renal failure in the setting of low perfusion from sepsis and dehydration *Renal function currently has improved but also has transaminitis which has slightly worsened likely related to shock liver *Lactic acid presentation was 4 with most recent check early this morning after hydration down to 2.1   Hypotension *Remains recurrent despite aggressive fluid resuscitation has received greater than 5 L so far *May require pressor agents  therefore we have requested a PCCm eval.    UTI (lower urinary tract infection) *Continue current antibiotic regimen please see below *Followup on culture results   Bacteremia due to gram positive rods and cocci *Likely source from PICC line. It is documented that several day prior to admission the PICC line was assessed and was noted to have purulent fluid around the insertion *Infectious disease team/Dr. Leodis Liverpool has been consulted and recommendations regarding antibiotic coverage and other workup are pending *For now we will continue vancomycin, Flagyl, and Azactam.   DIABETES MELLITUS, TYPE II *Continue sliding scale insulin watching for hypoglycemia in the setting of sepsis *Home medications included no coverage, early a.m. 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 Please note she has a complex surgical history over the past 12 months   C. difficile colitis- recent/still under treatment?-  - unable to tell from med list sent from nursing home if she was still on IV Flagyl  *Continue IV Flagyl *Currently not experiencing any diarrhea but was profoundly dehydrated prior to  admission *CT the abdomen and pelvis was completed this admission and shows no evidence of colitis  H/o  HYPERTENSION *Home medications of Norvasc, benazepril, Catapres, and metoprolol remain on hold because of hypotension as well as acute renal failure and sepsis   Disposition *Will remain in stepdown but given current physiologic status likely will transfer to the intensive care unit under the  management of the pulmonary critical care medicine team *Await critical care medicine evaluation    LOS: 1 day   [name], ANP pager [phone]  [facility] [email or web address] Password: [email or web address]  10/13/2011, 2:38 PM  I have examined the patient, reviewed the chart, called Dr Molli Knock and Dr Daiva Eves. I have modified the above note and agree with it.   Calvert Cantor, MD [phone]

## 2011-10-13 NOTE — Consult Note (Signed)
Date of Admission:  10/12/2011  Date of Consult:  10/13/2011  Reason for Consult: polymicrobial bacteremia and sepsis Referring Physician: Dr. Butler Denmark   HPI: [name] is an 78 y.o. female with PMH perforated duodenal ulcer, status post antrectomy, duodenal resection, Billroth 2 reconstruction, omental patching of Duodenal bulb, open cholecystectomy, gastrostomy and jejunostomy on 05/07/11. Her course was complicated by epigastric abscess requiring percutaneous drain per IR 05/31/11. At discharge she was noted to have malnutrition and short-gut syndrome requiring chronic TNA. Also  recently treated for C diff 07/2011. Brought to the ER on 4/15 w/ fever and purulent discharge from PICC insertion site, abd pain and UA with pyuria. On presentation was hypotensive and confused. Her BP Initially responded to IVFs. She was started on vancomycin, cefepime (later changed to aztreoanm) flagyl , PICC line was removed, and she was admitted to SDU. Later in the afternoon on 4/16 she again became hypotensive w/ SBP in 40s-60s (had already gotten 5 liters in ER of IVFs). ONE of her two blood cultures is growing GPcocci in pairs an chains and GPR (from the anaerobic bottle only) THe other blood culture, the picc culture are still incubating and without growth. We were consulted to help assist in management of this septic patient.    Past Medical History  Diagnosis Date  . Hypertension   . Reflux   . Right elbow pain     OTIF  . Dementia   . Depression   . Osteoarthritis   . Pancreatitis 11/2007    HOP  . Angiomyolipoma of kidney     right  . Ulcerative esophagitis 12/05/2007    hx elevated gastrin, severe on EGD by Dr Jena Gauss , h pylori negative  . Hiatal hernia   . S/P colonoscopy 2009    pt reports normal by Dr Lovell Sheehan  . Diabetes mellitus   . Anemia   . Hyponatremia   . Cellulitis   . Esophagitis   . Peptic ulcer   . Insomnia     Past Surgical History  Procedure Date  . Orif right hip 1999   [facility]  . Umbilical hernia repair 65 months old    [place]  . Esophagogastroduodenoscopy 01/28/2011    Procedure: ESOPHAGOGASTRODUODENOSCOPY (EGD);  Surgeon: Arlyce Harman, MD;  Location: [facility];  Service: Endoscopy;  Laterality: N/A;  . Colonoscopy 01/28/2011    Procedure: COLONOSCOPY;  Surgeon: Arlyce Harman, MD;  Location: [facility];  Service: Endoscopy;  Laterality: N/A;  . Laparotomy 02/04/2011    Procedure: EXPLORATORY LAPAROTOMY;  Surgeon: Fabio Bering;  Location: [facility] ORS;  Service: General;  Laterality: N/A;  . Laparotomy 05/07/2011    Procedure: EXPLORATORY LAPAROTOMY;  Surgeon: Ardeth Sportsman, MD;  Location: [facility] OR;  Service: General;  Laterality: N/A;  lysis of adhesions  . Gastrostomy 05/07/2011    Procedure: GASTROSTOMY;  Surgeon: Ardeth Sportsman, MD;  Location: [facility] OR;  Service: General;  Laterality: N/A;  g-tube / j -tube placement  . Gastrectomy 05/07/2011    Procedure: GASTRECTOMY;  Surgeon: Ardeth Sportsman, MD;  Location: [facility] OR;  Service: General;  Laterality: N/A;  Partial gastrectomy  . Cholecystectomy 05/07/2011    Procedure: CHOLECYSTECTOMY;  Surgeon: Ardeth Sportsman, MD;  Location: [facility] OR;  Service: General;  Laterality: N/A;  open  ergies:   Allergies  Allergen Reactions  . Other Swelling    Pinto beans cause swelling and hives  . Penicillins Swelling     Medications: I have  reviewed patients current medications as documented in Epic Anti-infectives     Start     Dose/Rate Route Frequency Ordered Stop   10/13/11 2200   aztreonam (AZACTAM) 1 g in dextrose 5 % 50 mL IVPB        1 g 100 mL/hr over 30 Minutes Intravenous 3 times per day 10/13/11 1619     10/13/11 1615   aztreonam (AZACTAM) 2 g in dextrose 5 % 50 mL IVPB  Status:  Discontinued        2 g 100 mL/hr over 30 Minutes Intravenous  Once 10/13/11 1611 10/13/11 1612   10/13/11 1615   vancomycin (VANCOCIN) IVPB 1000 mg/200 mL premix  Status:  Discontinued        1,000 mg 200 mL/hr over 60 Minutes  Intravenous  Once 10/13/11 1611 10/13/11 1612   10/13/11 1615   metroNIDAZOLE (FLAGYL) IVPB 500 mg  Status:  Discontinued        500 mg 100 mL/hr over 60 Minutes Intravenous  Once 10/13/11 1611 10/13/11 1612   10/13/11 1430   Ampicillin-Sulbactam (UNASYN) 3 g in sodium chloride 0.9 % 100 mL IVPB  Status:  Discontinued        3 g 100 mL/hr over 60 Minutes Intravenous STAT 10/13/11 1417 10/13/11 1444   10/13/11 0600   vancomycin (VANCOCIN) 1,250 mg in sodium chloride 0.9 % 250 mL IVPB        1,250 mg 166.7 mL/hr over 90 Minutes Intravenous Every 12 hours 10/13/11 0457     10/13/11 0600   aztreonam (AZACTAM) 1 g in dextrose 5 % 50 mL IVPB  Status:  Discontinued        1 g 100 mL/hr over 30 Minutes Intravenous 3 times per day 10/13/11 0507 10/13/11 1526   10/13/11 0400   metroNIDAZOLE (FLAGYL) IVPB 500 mg        500 mg 100 mL/hr over 60 Minutes Intravenous Every 8 hours 10/13/11 0323     10/12/11 1915   vancomycin (VANCOCIN) IVPB 1000 mg/200 mL premix        1,000 mg 200 mL/hr over 60 Minutes Intravenous  Once 10/12/11 1901 10/12/11 2227   10/12/11 1915   ceFEPIme (MAXIPIME) 2 g in dextrose 5 % 50 mL IVPB        2 g 100 mL/hr over 30 Minutes Intravenous To [facility] 10/12/11 1901 10/12/11 2105          Social History:  reports that she has never smoked. Her smokeless tobacco use includes Chew. She reports that she does not drink alcohol or use illicit drugs.  Family History  Problem Relation Age of Onset  . Cancer Mother     pelvic     As in HPI and primary teams notes otherwise 12 point review of systems is negative  Blood pressure 121/83, pulse 104, temperature 101.7 F (38.7 C), temperature source Core (Comment), resp. rate 27, height 5\' 5"  (1.651 m), weight 179 lb 7.3 oz (81.4 kg), SpO2 100.00%.  General: Alert and awake,  Chronically ill appearing, fatigued oriented x3,  HEENT: anicteric sclera, pupils reactive to light and accommodation, EOMI, oropharynx  clear and without exudate CVS tachycardic, normal r,  no murmur rubs or gallops Chest: clear to auscultation bilaterally, no wheezing, rales or rhonchi Abdomen: soft tender in lower quadratns, nondistended, normal bowel sounds, Extremities: right upper extremity with induration at picc insertion site and pt unable to fully extend arm secondary to pain  Neuro: nonfocal,  strength and sensation intact   Results for orders placed during the hospital encounter of 10/12/11 (from the past 48 hour(s))  GLUCOSE, CAPILLARY     Status: Abnormal   Collection Time   10/12/11  7:20 PM      Component Value Range Comment   Glucose-Capillary 208 (*) 70 - 99 (mg/dL)    Comment 1 Documented in Chart      Comment 2 Notify RN     URINALYSIS, ROUTINE W REFLEX MICROSCOPIC     Status: Abnormal   Collection Time   10/12/11  7:52 PM      Component Value Range Comment   Color, Urine YELLOW  YELLOW     APPearance TURBID (*) CLEAR     Specific Gravity, Urine >1.030 (*) 1.005 - 1.030     pH 5.5  5.0 - 8.0     Glucose, UA NEGATIVE  NEGATIVE (mg/dL)    Hgb urine dipstick LARGE (*) NEGATIVE     Bilirubin Urine SMALL (*) NEGATIVE     Ketones, ur NEGATIVE  NEGATIVE (mg/dL)    Protein, ur 578 (*) NEGATIVE (mg/dL)    Urobilinogen, UA 1.0  0.0 - 1.0 (mg/dL)    Nitrite NEGATIVE  NEGATIVE     Leukocytes, UA MODERATE (*) NEGATIVE    URINE MICROSCOPIC-ADD ON     Status: Abnormal   Collection Time   10/12/11  7:52 PM      Component Value Range Comment   Squamous Epithelial / LPF RARE  RARE     WBC, UA 3-6  <3 (WBC/hpf)    RBC / HPF TOO NUMEROUS TO COUNT  <3 (RBC/hpf)    Bacteria, UA FEW (*) RARE     Casts GRANULAR CAST (*) NEGATIVE     Urine-Other AMORPHOUS URATES/PHOSPHATES     CULTURE, BLOOD (ROUTINE X 2)     Status: Normal (Preliminary result)   Collection Time   10/12/11  8:00 PM      Component Value Range Comment   Specimen Description BLOOD HAND LEFT      Special Requests        Value: BOTTLES DRAWN AEROBIC  AND ANAEROBIC AERO 10CC, ANAE 5CC   Culture  Setup Time [id number]      Culture        Value: GRAM POSITIVE RODS     GRAM POSITIVE COCCI IN CHAINS     Note: Gram Stain Report Called to,Read Back By and Verified With: CHRISTY FRALEY @1132  10/13/11 BY KRAWS   Report Status PENDING     CBC     Status: Abnormal   Collection Time   10/12/11  8:15 PM      Component Value Range Comment   WBC 15.1 (*) 4.0 - 10.5 (K/uL)    RBC 3.82 (*) 3.87 - 5.11 (MIL/uL)    Hemoglobin 10.2 (*) 12.0 - 15.0 (g/dL)    HCT 24.4 (*) 01.0 - 46.0 (%)    MCV 80.4  78.0 - 100.0 (fL)    MCH 26.7  26.0 - 34.0 (pg)    MCHC 33.2  30.0 - 36.0 (g/dL)    RDW 27.2 (*) 53.6 - 15.5 (%)    Platelets 236  150 - 400 (K/uL)   DIFFERENTIAL     Status: Abnormal   Collection Time   10/12/11  8:15 PM      Component Value Range Comment   Neutrophils Relative 88 (*) 43 - 77 (%)    Neutro Abs 13.3 (*) 1.7 -  7.7 (K/uL)    Lymphocytes Relative 6 (*) 12 - 46 (%)    Lymphs Abs 0.9  0.7 - 4.0 (K/uL)    Monocytes Relative 6  3 - 12 (%)    Monocytes Absolute 0.9  0.1 - 1.0 (K/uL)    Eosinophils Relative 0  0 - 5 (%)    Eosinophils Absolute 0.0  0.0 - 0.7 (K/uL)    Basophils Relative 0  0 - 1 (%)    Basophils Absolute 0.0  0.0 - 0.1 (K/uL)   COMPREHENSIVE METABOLIC PANEL     Status: Abnormal   Collection Time   10/12/11  8:15 PM      Component Value Range Comment   Sodium 129 (*) 135 - 145 (mEq/L)    Potassium 4.8  3.5 - 5.1 (mEq/L)    Chloride 93 (*) 96 - 112 (mEq/L)    CO2 18 (*) 19 - 32 (mEq/L)    Glucose, Bld 216 (*) 70 - 99 (mg/dL)    BUN 38 (*) 6 - 23 (mg/dL)    Creatinine, Ser 7.82 (*) 0.50 - 1.10 (mg/dL)    Calcium 8.9  8.4 - 10.5 (mg/dL)    Total Protein 7.2  6.0 - 8.3 (g/dL)    Albumin 2.6 (*) 3.5 - 5.2 (g/dL)    AST 47 (*) 0 - 37 (U/L)    ALT 40 (*) 0 - 35 (U/L)    Alkaline Phosphatase 174 (*) 39 - 117 (U/L)    Total Bilirubin 0.3  0.3 - 1.2 (mg/dL)    GFR calc non Af Amer 38 (*) >90 (mL/min)    GFR calc Af Amer 44  (*) >90 (mL/min)   PROCALCITONIN     Status: Normal   Collection Time   10/12/11  8:15 PM      Component Value Range Comment   Procalcitonin 59.17     LACTIC ACID, PLASMA     Status: Abnormal   Collection Time   10/12/11  8:19 PM      Component Value Range Comment   Lactic Acid, Venous 4.0 (*) 0.5 - 2.2 (mmol/L)   GLUCOSE, CAPILLARY     Status: Abnormal   Collection Time   10/13/11  3:44 AM      Component Value Range Comment   Glucose-Capillary 149 (*) 70 - 99 (mg/dL)   MRSA PCR SCREENING     Status: Normal   Collection Time   10/13/11  3:51 AM      Component Value Range Comment   MRSA by PCR NEGATIVE  NEGATIVE    LACTIC ACID, PLASMA     Status: Normal   Collection Time   10/13/11  5:30 AM      Component Value Range Comment   Lactic Acid, Venous 2.1  0.5 - 2.2 (mmol/L)   COMPREHENSIVE METABOLIC PANEL     Status: Abnormal   Collection Time   10/13/11  5:30 AM      Component Value Range Comment   Sodium 135  135 - 145 (mEq/L)    Potassium 4.3  3.5 - 5.1 (mEq/L)    Chloride 106  96 - 112 (mEq/L) DELTA CHECK NOTED   CO2 19  19 - 32 (mEq/L)    Glucose, Bld 146 (*) 70 - 99 (mg/dL)    BUN 30 (*) 6 - 23 (mg/dL)    Creatinine, Ser 9.56  0.50 - 1.10 (mg/dL)    Calcium 7.7 (*) 8.4 - 10.5 (mg/dL)    Total Protein 5.9 (*)  6.0 - 8.3 (g/dL)    Albumin 2.1 (*) 3.5 - 5.2 (g/dL)    AST 89 (*) 0 - 37 (U/L)    ALT 72 (*) 0 - 35 (U/L)    Alkaline Phosphatase 133 (*) 39 - 117 (U/L)    Total Bilirubin 0.2 (*) 0.3 - 1.2 (mg/dL)    GFR calc non Af Amer 51 (*) >90 (mL/min)    GFR calc Af Amer 59 (*) >90 (mL/min)   CBC     Status: Abnormal   Collection Time   10/13/11  5:30 AM      Component Value Range Comment   WBC 13.3 (*) 4.0 - 10.5 (K/uL)    RBC 3.08 (*) 3.87 - 5.11 (MIL/uL)    Hemoglobin 8.1 (*) 12.0 - 15.0 (g/dL)    HCT 16.1 (*) 09.6 - 46.0 (%)    MCV 81.2  78.0 - 100.0 (fL)    MCH 26.3  26.0 - 34.0 (pg)    MCHC 32.4  30.0 - 36.0 (g/dL)    RDW 04.5 (*) 40.9 - 15.5 (%)    Platelets 209   150 - 400 (K/uL)   DIFFERENTIAL     Status: Abnormal   Collection Time   10/13/11  5:30 AM      Component Value Range Comment   Neutrophils Relative 79 (*) 43 - 77 (%)    Neutro Abs 10.5 (*) 1.7 - 7.7 (K/uL)    Lymphocytes Relative 12  12 - 46 (%)    Lymphs Abs 1.7  0.7 - 4.0 (K/uL)    Monocytes Relative 8  3 - 12 (%)    Monocytes Absolute 1.1 (*) 0.1 - 1.0 (K/uL)    Eosinophils Relative 0  0 - 5 (%)    Eosinophils Absolute 0.0  0.0 - 0.7 (K/uL)    Basophils Relative 0  0 - 1 (%)    Basophils Absolute 0.0  0.0 - 0.1 (K/uL)   GLUCOSE, CAPILLARY     Status: Abnormal   Collection Time   10/13/11  8:28 AM      Component Value Range Comment   Glucose-Capillary 120 (*) 70 - 99 (mg/dL)   GLUCOSE, CAPILLARY     Status: Abnormal   Collection Time   10/13/11  1:24 PM      Component Value Range Comment   Glucose-Capillary 116 (*) 70 - 99 (mg/dL)       Component Value Date/Time   SDES BLOOD HAND LEFT 10/12/2011 2000   SPECREQUEST BOTTLES DRAWN AEROBIC AND ANAEROBIC AERO 10CC, ANAE 5CC 10/12/2011 2000   CULT  Value: GRAM POSITIVE RODS GRAM POSITIVE COCCI IN CHAINS Note: Gram Stain Report Called to,Read Back By and Verified With: CHRISTY FRALEY @1132  10/13/11 BY KRAWS 10/12/2011 2000   REPTSTATUS PENDING 10/12/2011 2000   Ct Abdomen Pelvis W Contrast  10/13/2011  *RADIOLOGY REPORT*  Clinical Data: 78 year old with sepsis.  CT ABDOMEN AND PELVIS WITH CONTRAST  Technique:  Multidetector CT imaging of the abdomen and pelvis was performed following the standard protocol during bolus administration of intravenous contrast.  Contrast: 80mL OMNIPAQUE IOHEXOL 300 MG/ML  SOLN  Comparison: 07/29/2011  Findings: There are tiny bilateral pleural effusions with basilar atelectasis.  A small amount of focal atelectasis or round atelectasis at the right lung base on sequence 3, image 7.  No evidence for free air.  Coronary artery calcifications.  The gallbladder has been removed. Again noted is low density fluid or  postsurgical changes between the stomach and  left hepatic lobe, best seen on sequence 2, image 23.  This area roughly measures 2.1 cm and similar to the prior examination.  There is residual fluid or edema in the gallbladder fossa and along the inferior liver margin.  No evidence for a drainable fluid collection.  Gastrostomy tube is present.  Again noted is a large calcification near the pancreatic head and neck. There is oral contrast within the stomach and large bowel.  No gross abnormality to the liver, portal venous system, adrenal glands, kidneys or spleen.  Again noted are low density structures within the kidneys which are too small to definitively characterize but could represent cysts.  Calcifications associated with the uterus and cannot exclude fibroids.  No significant free fluid in the pelvis.  There is a right hip replacement.  No significant lymphadenopathy. A Foley catheter within the urinary bladder.  IMPRESSION:  Postsurgical changes in the upper abdomen.  Residual low density fluid or edema around the liver has minimally changed since 07/29/2011.  Tiny pleural effusions with bibasilar atelectasis.  Original Report Authenticated By: Richarda Overlie, M.D.   Dg Chest Port 1 View  10/12/2011  *RADIOLOGY REPORT*  Clinical Data: Fever.  Drainage about a PICC.  PORTABLE CHEST - 1 VIEW  Comparison: Fluoroscopic spot view from PICC placement 09/29/2011. Plain films of the chest 07/28/2011.  CT chest 01/26/2011.  Findings: The patient's right PICC has backed out with the tip in the right axilla.  Lungs are clear.  Heart size is normal.  No pneumothorax or pleural effusion.  IMPRESSION:  1.  Tip of the patient's right PICC is now in the right axilla. 2.  No acute cardiopulmonary disease.  Original Report Authenticated By: Bernadene Bell. Maricela Curet, M.D.     Recent Results (from the past 720 hour(s))  CULTURE, BLOOD (ROUTINE X 2)     Status: Normal (Preliminary result)   Collection Time   10/12/11  8:00 PM       Component Value Range Status Comment   Specimen Description BLOOD HAND LEFT   Final    Special Requests     Final    Value: BOTTLES DRAWN AEROBIC AND ANAEROBIC AERO 10CC, ANAE 5CC   Culture  Setup Time [id number]   Final    Culture     Final    Value: GRAM POSITIVE RODS     GRAM POSITIVE COCCI IN CHAINS     Note: Gram Stain Report Called to,Read Back By and Verified With: CHRISTY FRALEY @1132  10/13/11 BY KRAWS   Report Status PENDING   Incomplete   MRSA PCR SCREENING     Status: Normal   Collection Time   10/13/11  3:51 AM      Component Value Range Status Comment   MRSA by PCR NEGATIVE  NEGATIVE  Final      Impression/Recommendation  78 year old admittted with purulent discharge from PICC line and now with GPCChains and pairs, and GPR in 1/2 bloood cultures with septic physiology  1) Sepsis: Confusing picture. I initially understood pt to have GPCpairs and GPR in 2/2 cultures but this was due to the way the data was presented in EPIC. They only received the urine and cath tip today at [address] so there are no results to date. Differential for her sepsis includes infected PICC line possibly with septic thrombophlebitis, but also her urine  --continue vancomycin, flagyl (these would cover the organisms isolated from 1/2 cultures blood which may actually be contaminants) --add back gram negative  coverage with ceftazidime for now --if pt worsens further would change from ceftaz and flagyl to imipenem --avoid fluoroquinolones   Thank you so much for this interesting consult,   Acey Lav 10/13/2011, 4:25 PM   [phone] (pager) [phone] (office)

## 2011-10-13 NOTE — Progress Notes (Addendum)
ANTIBIOTIC CONSULT NOTE - FOLLOW UP  Pharmacy Consult for Fortaz, Vancomycin, Flagyl Indication: sepsis  Allergies  Allergen Reactions  . Other Swelling    Pinto beans cause swelling and hives  . Penicillins Swelling    Patient Measurements: Height: 5\' 5"  (165.1 cm) Weight: 179 lb 7.3 oz (81.4 kg) IBW/kg (Calculated) : 57    Vital Signs: Temp: 101.7 F (38.7 C) (04/16 1600) Temp src: Core (Comment) (04/16 1540) BP: 121/83 mmHg (04/16 1600) Pulse Rate: 104  (04/16 1600) Intake/Output from previous day: 04/15 0701 - 04/16 0700 In: 5410 [I.V.:5410] Out: 350 [Urine:350] Intake/Output from this shift: Total I/O In: 2829 [P.O.:650; I.V.:1929; Other:250] Out: 1150 [Urine:1150]  Labs:  [facility] 10/13/11 0530 10/12/11 2015  WBC 13.3* 15.1*  HGB 8.1* 10.2*  PLT 209 236  LABCREA -- --  CREATININE 1.02 1.31*   Estimated Creatinine Clearance: 47.9 ml/min (by C-G formula based on Cr of 1.02). No results found for this basename: VANCOTROUGH:2,VANCOPEAK:2,VANCORANDOM:2,GENTTROUGH:2,GENTPEAK:2,GENTRANDOM:2,TOBRATROUGH:2,TOBRAPEAK:2,TOBRARND:2,AMIKACINPEAK:2,AMIKACINTROU:2,AMIKACIN:2, in the last 72 hours   Microbiology: Recent Results (from the past 720 hour(s))  CULTURE, BLOOD (ROUTINE X 2)     Status: Normal (Preliminary result)   Collection Time   10/12/11  8:00 PM      Component Value Range Status Comment   Specimen Description BLOOD HAND LEFT   Final    Special Requests     Final    Value: BOTTLES DRAWN AEROBIC AND ANAEROBIC AERO 10CC, ANAE 5CC   Culture  Setup Time [id number]   Final    Culture     Final    Value: GRAM POSITIVE RODS     GRAM POSITIVE COCCI IN CHAINS     Note: Gram Stain Report Called to,Read Back By and Verified With: CHRISTY FRALEY @1132  10/13/11 BY KRAWS   Report Status PENDING   Incomplete   MRSA PCR SCREENING     Status: Normal   Collection Time   10/13/11  3:51 AM      Component Value Range Status Comment   MRSA by PCR NEGATIVE  NEGATIVE   Final     Anti-infectives     Start     Dose/Rate Route Frequency Ordered Stop   10/13/11 2200   aztreonam (AZACTAM) 1 g in dextrose 5 % 50 mL IVPB        1 g 100 mL/hr over 30 Minutes Intravenous 3 times per day 10/13/11 1619     10/13/11 1615   aztreonam (AZACTAM) 2 g in dextrose 5 % 50 mL IVPB  Status:  Discontinued        2 g 100 mL/hr over 30 Minutes Intravenous  Once 10/13/11 1611 10/13/11 1612   10/13/11 1615   vancomycin (VANCOCIN) IVPB 1000 mg/200 mL premix  Status:  Discontinued        1,000 mg 200 mL/hr over 60 Minutes Intravenous  Once 10/13/11 1611 10/13/11 1612   10/13/11 1615   metroNIDAZOLE (FLAGYL) IVPB 500 mg  Status:  Discontinued        500 mg 100 mL/hr over 60 Minutes Intravenous  Once 10/13/11 1611 10/13/11 1612   10/13/11 1430   Ampicillin-Sulbactam (UNASYN) 3 g in sodium chloride 0.9 % 100 mL IVPB  Status:  Discontinued        3 g 100 mL/hr over 60 Minutes Intravenous STAT 10/13/11 1417 10/13/11 1444   10/13/11 0600   vancomycin (VANCOCIN) 1,250 mg in sodium chloride 0.9 % 250 mL IVPB        1,250 mg 166.7 mL/hr  over 90 Minutes Intravenous Every 12 hours 10/13/11 0457     10/13/11 0600   aztreonam (AZACTAM) 1 g in dextrose 5 % 50 mL IVPB  Status:  Discontinued        1 g 100 mL/hr over 30 Minutes Intravenous 3 times per day 10/13/11 0507 10/13/11 1526   10/13/11 0400   metroNIDAZOLE (FLAGYL) IVPB 500 mg        500 mg 100 mL/hr over 60 Minutes Intravenous Every 8 hours 10/13/11 0323     10/12/11 1915   vancomycin (VANCOCIN) IVPB 1000 mg/200 mL premix        1,000 mg 200 mL/hr over 60 Minutes Intravenous  Once 10/12/11 1901 10/12/11 2227   10/12/11 1915   ceFEPIme (MAXIPIME) 2 g in dextrose 5 % 50 mL IVPB        2 g 100 mL/hr over 30 Minutes Intravenous To [facility] Dept 10/12/11 1901 10/12/11 2105          Assessment: Azactam, vancomycin, Flagyl started early this AM  Azactam now changed to [place]  Now transferred to 2100 with  sepsis  CrCl=48 ml/min  Goal of Therapy:  Appropriate dosing Vancomycin trough = 15 to 20 mcg / dL  Plan:  1) Continue Fortaz 1 Gram IV Q 8 hours 2) Continue Flagyl 500 mg IV Q 8 hours 3) Continue Vancomycin 1250 mg IV Q 12 hours 4) Follow up plan, renal function, cultures  Thank you. Elwin Sleight 10/13/2011,4:20 PM

## 2011-10-13 NOTE — Procedures (Signed)
Central Venous Catheter Insertion Procedure Note [name] [id number] 1931/08/19  Procedure: Insertion of Central Venous Catheter Indications: Assessment of intravascular volume  Procedure Details Consent: emergent due to shock Time Out: Verified patient identification, verified procedure, site/side was marked, verified correct patient position, special equipment/implants available, medications/allergies/relevent history reviewed, required imaging and test results available.  Performed  Maximum sterile technique was used including antiseptics, cap, gloves, gown, hand hygiene, mask and sheet. Skin prep: Chlorhexidine; local anesthetic administered A antimicrobial bonded/coated triple lumen catheter was placed in the left subclavian vein using the Seldinger technique.  Evaluation Blood flow good Complications: No apparent complications Patient did tolerate procedure well. Chest X-ray ordered to verify placement.  CXR: normal.  U/S used in placement and I was present bedside for procedure.  BABCOCK,PETE 10/13/2011, 4:19 PM  Patient seen and examined, agree with above note.  I dictated the care and orders written for this patient under my direction.  Koren Bound, M.D. [phone]

## 2011-10-13 NOTE — Progress Notes (Signed)
Patients bp 86/42 and temp 102.5. md aware and new orders given.

## 2011-10-14 DIAGNOSIS — G9340 Encephalopathy, unspecified: Secondary | ICD-10-CM

## 2011-10-14 DIAGNOSIS — M79609 Pain in unspecified limb: Secondary | ICD-10-CM

## 2011-10-14 DIAGNOSIS — N3000 Acute cystitis without hematuria: Secondary | ICD-10-CM

## 2011-10-14 DIAGNOSIS — J9600 Acute respiratory failure, unspecified whether with hypoxia or hypercapnia: Secondary | ICD-10-CM

## 2011-10-14 DIAGNOSIS — E872 Acidosis: Secondary | ICD-10-CM

## 2011-10-14 DIAGNOSIS — N179 Acute kidney failure, unspecified: Secondary | ICD-10-CM

## 2011-10-14 LAB — COMPREHENSIVE METABOLIC PANEL
ALT: 57 U/L — ABNORMAL HIGH (ref 0–35)
AST: 46 U/L — ABNORMAL HIGH (ref 0–37)
CO2: 19 mEq/L (ref 19–32)
Calcium: 7.8 mg/dL — ABNORMAL LOW (ref 8.4–10.5)
Chloride: 106 mEq/L (ref 96–112)
Creatinine, Ser: 0.64 mg/dL (ref 0.50–1.10)
GFR calc Af Amer: >90 mL/min (ref >90)
GFR calc non Af Amer: 83 mL/min — ABNORMAL LOW (ref >90)
Glucose, Bld: 112 mg/dL — ABNORMAL HIGH (ref 70–99)
Sodium: 134 mEq/L — ABNORMAL LOW (ref 135–145)
Total Bilirubin: 0.3 mg/dL (ref 0.3–1.2)

## 2011-10-14 LAB — CBC
Hemoglobin: 7 g/dL — ABNORMAL LOW (ref 12.0–15.0)
Hemoglobin: 8.5 g/dL — ABNORMAL LOW (ref 12.0–15.0)
MCH: 26.9 pg (ref 26.0–34.0)
MCH: 26.9 pg (ref 26.0–34.0)
MCV: 80.8 fL (ref 78.0–100.0)
MCV: 80.1 fL (ref 78.0–100.0)
RBC: 2.6 MIL/uL — ABNORMAL LOW (ref 3.87–5.11)
RBC: 3.16 MIL/uL — ABNORMAL LOW (ref 3.87–5.11)
WBC: 8.4 10*3/uL (ref 4.0–10.5)

## 2011-10-14 LAB — POCT I-STAT 3, ART BLOOD GAS (G3+)
Acid-base deficit: 7 mmol/L — ABNORMAL HIGH (ref 0.0–2.0)
Bicarbonate: 17.1 mEq/L — ABNORMAL LOW (ref 20.0–24.0)
O2 Saturation: 96 %
TCO2: 18 mmol/L (ref 0–100)
pCO2 arterial: 30.1 mmHg — ABNORMAL LOW (ref 35.0–45.0)
pO2, Arterial: 84 mmHg (ref 80.0–100.0)

## 2011-10-14 LAB — GLUCOSE, CAPILLARY
Glucose-Capillary: 109 mg/dL — ABNORMAL HIGH (ref 70–99)
Glucose-Capillary: 105 mg/dL — ABNORMAL HIGH (ref 70–99)
Glucose-Capillary: 124 mg/dL — ABNORMAL HIGH (ref 70–99)

## 2011-10-14 LAB — URINE CULTURE

## 2011-10-14 LAB — MAGNESIUM: Magnesium: 1.5 mg/dL (ref 1.5–2.5)

## 2011-10-14 MED ORDER — HEPARIN (PORCINE) IN NACL 100-0.45 UNIT/ML-% IJ SOLN
1550.0000 [IU]/h | INTRAMUSCULAR | Status: DC
  Administered 2011-10-14: 1100 [IU]/h via INTRAVENOUS
  Administered 2011-10-15: 1250 [IU]/h via INTRAVENOUS
  Filled 2011-10-14 (×3): qty 250

## 2011-10-14 MED ORDER — HEPARIN SODIUM (PORCINE) 5000 UNIT/ML IJ SOLN
5000.0000 [IU] | Freq: Three times a day (TID) | INTRAMUSCULAR | Status: DC
  Administered 2011-10-14: 5000 [IU] via SUBCUTANEOUS
  Filled 2011-10-14 (×3): qty 1

## 2011-10-14 MED ORDER — TRACE MINERALS CR-CU-MN-SE-ZN 10-1000-500-60 MCG/ML IV SOLN
INTRAVENOUS | Status: AC
  Administered 2011-10-14: 18:00:00 via INTRAVENOUS
  Filled 2011-10-14: qty 2000

## 2011-10-14 MED ORDER — INSULIN ASPART 100 UNIT/ML ~~LOC~~ SOLN
0.0000 [IU] | SUBCUTANEOUS | Status: DC
  Administered 2011-10-14: 1 [IU] via SUBCUTANEOUS
  Administered 2011-10-15: 2 [IU] via SUBCUTANEOUS
  Administered 2011-10-15: 1 [IU] via SUBCUTANEOUS
  Administered 2011-10-15 (×2): 2 [IU] via SUBCUTANEOUS
  Administered 2011-10-15: 1 [IU] via SUBCUTANEOUS
  Administered 2011-10-15: 2 [IU] via SUBCUTANEOUS
  Administered 2011-10-16: 1 [IU] via SUBCUTANEOUS
  Administered 2011-10-16: 2 [IU] via SUBCUTANEOUS
  Administered 2011-10-16: 1 [IU] via SUBCUTANEOUS
  Administered 2011-10-16 (×2): 2 [IU] via SUBCUTANEOUS
  Administered 2011-10-17 – 2011-10-18 (×8): 1 [IU] via SUBCUTANEOUS
  Administered 2011-10-18: 2 [IU] via SUBCUTANEOUS
  Administered 2011-10-19 – 2011-10-20 (×5): 1 [IU] via SUBCUTANEOUS
  Administered 2011-10-20: 2 [IU] via SUBCUTANEOUS
  Administered 2011-10-20 – 2011-10-21 (×5): 1 [IU] via SUBCUTANEOUS
  Administered 2011-10-21: 2 [IU] via SUBCUTANEOUS
  Administered 2011-10-22 (×4): 1 [IU] via SUBCUTANEOUS
  Administered 2011-10-22: 2 [IU] via SUBCUTANEOUS
  Administered 2011-10-23: 1 [IU] via SUBCUTANEOUS
  Administered 2011-10-23 (×3): 2 [IU] via SUBCUTANEOUS
  Administered 2011-10-23: 1 [IU] via SUBCUTANEOUS
  Administered 2011-10-23: 2 [IU] via SUBCUTANEOUS
  Administered 2011-10-24 (×2): 1 [IU] via SUBCUTANEOUS
  Administered 2011-10-24: 2 [IU] via SUBCUTANEOUS
  Administered 2011-10-24: 1 [IU] via SUBCUTANEOUS
  Administered 2011-10-25: 3 [IU] via SUBCUTANEOUS
  Administered 2011-10-25 (×2): 1 [IU] via SUBCUTANEOUS
  Administered 2011-10-25 – 2011-10-26 (×5): 2 [IU] via SUBCUTANEOUS
  Administered 2011-10-26 (×2): 1 [IU] via SUBCUTANEOUS
  Administered 2011-10-27: 2 [IU] via SUBCUTANEOUS
  Administered 2011-10-27: 1 [IU] via SUBCUTANEOUS
  Administered 2011-10-27 (×2): 2 [IU] via SUBCUTANEOUS

## 2011-10-14 MED ORDER — POTASSIUM CHLORIDE 10 MEQ/50ML IV SOLN
INTRAVENOUS | Status: AC
  Filled 2011-10-14: qty 50

## 2011-10-14 MED ORDER — HEPARIN SODIUM (PORCINE) 1000 UNIT/ML IJ SOLN: 5000.0000 [IU] | Freq: Three times a day (TID) | INTRAMUSCULAR | Status: DC

## 2011-10-14 MED ORDER — POTASSIUM CHLORIDE CRYS ER 20 MEQ PO TBCR: 40.0000 meq | EXTENDED_RELEASE_TABLET | ORAL | Status: DC

## 2011-10-14 MED ORDER — POTASSIUM CHLORIDE 10 MEQ/50ML IV SOLN
10.0000 meq | INTRAVENOUS | Status: AC
  Administered 2011-10-14 (×5): 10 meq via INTRAVENOUS
  Filled 2011-10-14 (×4): qty 50

## 2011-10-14 MED ORDER — FAT EMULSION 20 % IV EMUL
250.0000 mL | INTRAVENOUS | Status: AC
  Administered 2011-10-14: 250 mL via INTRAVENOUS
  Filled 2011-10-14: qty 250

## 2011-10-14 NOTE — Progress Notes (Signed)
Clinical Social Work Department BRIEF PSYCHOSOCIAL ASSESSMENT 10/14/2011  Patient:  JAZZY, PARMER     Account Number:  [id number]     Admit date:  10/12/2011  Clinical Social Worker:  Margaree Mackintosh  Date/Time:  10/14/2011 02:36 PM  Referred by:  CSW  Date Referred:  10/14/2011 Referred for  SNF Placement   Other Referral:   Interview type:  Family Other interview type:    PSYCHOSOCIAL DATA Living Status:  FACILITY Admitted from facility:  [facility], [place] Level of care:  Skilled Nursing Facility Primary support name:  Langston Reusing [phone] Primary support relationship to patient:  CHILD, ADULT Degree of support available:    CURRENT CONCERNS  Other Concerns:    SOCIAL WORK ASSESSMENT / PLAN Clinical Social Worker phoned Clara's home phone; Clara's son Waller answered.  Carmell Austria stated that Lewanda Rife is currently at [facility] with her dtr due to dtr recieving cancer treatment.  Jarrod expressed that Clara had "a lot going on right now". Carmell Austria acknowledged that pt was at the hospital and would let Clara know that this CSW phoned.  CSW to continue to follow and assist as needed.   Assessment/plan status:  Psychosocial Support/Ongoing Assessment of Needs Other assessment/ plan:   Information/referral to community resources:    PATIENT'S/FAMILY'S RESPONSE TO PLAN OF CARE: Carmell Austria was receptive to CSW intervention.        Angelia Mould, MSW, [place] [phone]

## 2011-10-14 NOTE — Progress Notes (Signed)
ANTICOAGULATION CONSULT NOTE - Initial Consult  Pharmacy Consult for heparin Indication: DVT  Allergies  Allergen Reactions  . Other Swelling    Pinto beans cause swelling and hives  . Penicillins Swelling    Patient Measurements: Height: 5\' 5"  (165.1 cm) Weight: 184 lb 1.4 oz (83.5 kg) IBW/kg (Calculated) : 57  Heparin Dosing Weight: 74.9kg  Vital Signs: Temp: 100.7 F (38.2 C) (04/17 1500) Temp src: Core (Comment) (04/17 1200) BP: 141/54 mmHg (04/17 1700) Pulse Rate: 95  (04/17 1600)  Labs:  Basename 10/14/11 1030 10/14/11 0500 10/13/11 1630 10/13/11 0530  HGB 8.5* 7.0* -- --  HCT 25.3* 21.0* 24.8* --  PLT 154 153 172 --  APTT -- -- 53* --  LABPROT -- -- 19.2* --  INR -- -- 1.58* --  HEPARINUNFRC -- -- -- --  CREATININE -- 0.64 0.75 1.02  CKTOTAL -- -- 213* --  CKMB -- -- 2.4 --  TROPONINI -- -- <0.30 --   Estimated Creatinine Clearance: 61.8 ml/min (by C-G formula based on Cr of 0.64).  Medical History: Past Medical History  Diagnosis Date  . Hypertension   . Reflux   . Right elbow pain     OTIF  . Dementia   . Depression   . Osteoarthritis   . Pancreatitis 11/2007    HOP  . Angiomyolipoma of kidney     right  . Ulcerative esophagitis 12/05/2007    hx elevated gastrin, severe on EGD by Dr Jena Gauss , h pylori negative  . Hiatal hernia   . S/P colonoscopy 2009    pt reports normal by Dr Lovell Sheehan  . Diabetes mellitus   . Anemia   . Hyponatremia   . Cellulitis   . Esophagitis   . Peptic ulcer   . Insomnia     Medications:  Scheduled:    . cefTAZidime (FORTAZ)  IV  1 g Intravenous Q8H  . heparin subcutaneous  5,000 Units Subcutaneous Q8H  . insulin aspart  0-9 Units Subcutaneous Q4H  . metronidazole  500 mg Intravenous Q8H  . pantoprazole (PROTONIX) IV  40 mg Intravenous Q24H  . potassium chloride  10 mEq Intravenous Q1 Hr x 5  . potassium chloride      . vancomycin  1,250 mg Intravenous Q12H  . DISCONTD: enoxaparin  40 mg Subcutaneous Q24H  .  DISCONTD: heparin  5,000 Units Intravenous TID  . DISCONTD: potassium chloride  40 mEq Oral Q4H  . DISCONTD: sodium chloride  500 mL Intravenous Once   Infusions:    . sodium chloride 20 mL/hr at 10/14/11 1113  . sodium chloride 20 mL/hr at 10/14/11 1100  . TPN (CLINIMIX) +/- additives     And  . fat emulsion    . vasopressin (PITRESSIN) infusion - *FOR SHOCK*      Assessment: 78 yo female with new upper extremity DVT on doppler. H/H 8.5/25.3; Plt 154; INR on 04/16 1.58 (not on coumadin PTA). Had one dose of lovenox 40mg  x1 at 1017 today. Also had, heparin SQ at 1453 Per MD, no coumadin now 'cause may have intervention in the acute setting. Goal of Therapy:  Heparin level 0.3-0.7 units/ml   Plan:  1) Heparin at 1100 units/hr (=11 ml/hr). No bolus. 2) Check an 8 hour heparin level and an INR after drip is started. 3) Daily heparin level and CBC 4) d/c Sq heparin.  Elmira Olkowski, Tsz-Yin 10/14/2011,5:26 PM

## 2011-10-14 NOTE — Progress Notes (Signed)
Physician-Brief Progress Note Patient Name: [name] DOB: 26-Feb-1932 MRN: [id number]  Date of Service  10/14/2011   HPI/Events of Note   Upper extremity DVT on doppler  eICU Interventions  Started on full dose heparin per pharmacy. Not started on coumadin at this time due to potential need for interventional procedures in the acute setting.   Intervention Category Intermediate Interventions: Best-practice therapies (e.g. DVT, beta blocker, etc.)  Siboney Requejo 10/14/2011, 5:17 PM

## 2011-10-14 NOTE — Progress Notes (Signed)
Name: [name] MRN: [id number] DOB: 08-19-1931 LOS: 2  PCCM PROGRESS NOTE  Brief Hx: 78 year old female with PMH perforated duodenal ulcer, status post antrectomy, duodenal resection, Billroth 2 reconstruction, omental patching of Duodenal bulb, open cholecystectomy, gastrostomy and jejunostomy on 05/07/11. Her course was complicated by epigastric abscess requiring percutaneous drain per IR 05/31/11. At discharge she was noted to have malnutrition and short-gut syndrome requiring chronic TNA. Also most recently treated for C diff 07/2011. Brought to the ER on 4/15 w/ fever and purulent discharge from PICC insertion site, abd pain and UA concerning for UTI. On presentation was hypotensive and confused. Her BP Initially responded to IVFs. She was started on antibiotics, PICC line was removed, and she was admitted to SDU. Later in the afternoon on 4/16 she again became hypotensive w/ SBP in 40s-60s (had already gotten 5 liters in ER of IVFs). PCCM was asked to see.   Subjective:  Pt is awake and answering questions this AM, sounds confused. Told nurse she was at home and then was able to tell me she was at hospital. Denying pain at this time except minor headache. No nausea/vomiting/diarrhea. States she slept well. Still on levophed and vaso.   Vital Signs: Filed Vitals:   10/14/11 0700  BP: 129/47  Pulse: 96  Temp: 98.3 F (36.8 C)  Resp: 22   I/O last 3 completed shifts: In: 78295.6 [P.O.:650; I.V.:8857.4; Other:250; IV Piggyback:910] Out: 3315 [Urine:2915; Drains:400]  Physical Examination: General:  Awake and oriented times 1-2, knows name and place Neuro:  Moves all four extremities, no focal deficits, HEENT:  EOM intact, PERRLA Neck:  Supple, no JVD Cardiovascular:  S1 S2 heard Lungs:  Good air flow bilaterally, no coarse sounds Abdomen: PEG in place, set to suction draining bilious fluid, multiple scars, some pus around midline scar, no erythema Musculoskeletal:  No focal  weakness Skin:  No breakdown noted  Labs: Basic Metabolic Panel:  Basename 10/14/11 0500 10/13/11 1630  NA 134* 133*  K 3.1* 3.7  CL 106 104  CO2 19 18*  GLUCOSE 112* 115*  BUN 15 19  CREATININE 0.64 0.75  CALCIUM 7.8* 7.9*  MG -- --  PHOS -- --   Liver Function Tests:  Basename 10/14/11 0500 10/13/11 1630  AST 46* 73*  ALT 57* 76*  ALKPHOS 113 145*  BILITOT 0.3 0.4  PROT 5.2* 6.1  ALBUMIN 1.8* 2.1*    Basename 10/13/11 1630  LIPASE 18  AMYLASE --   CBC:  Basename 10/14/11 0500 10/13/11 1630 10/13/11 0530 10/12/11 2015  WBC 8.4 11.2* -- --  NEUTROABS -- -- 10.5* 13.3*  HGB 7.0* 8.3* -- --  HCT 21.0* 24.8* -- --  MCV 80.8 79.2 -- --  PLT 153 172 -- --   Cardiac Enzymes:  Basename 10/13/11 1630  CKTOTAL 213*  CKMB 2.4  CKMBINDEX --  TROPONINI <0.30   CBG:  Basename 10/13/11 1643 10/13/11 1324 10/13/11 0828 10/13/11 0344 10/12/11 1920  GLUCAP 95 116* 120* 149* 208*   Coagulation:  Basename 10/13/11 1630  LABPROT 19.2*  INR 1.58*   Lines, Tubes, etc: PICC - removed 4/16 CVC - 4/83 PEG - 102 days old  Microbiology: Blood cultures: Gram + cocci, gram positive rods Urine: pending Catheter tip: pending  Antibiotics:  Vancomycin Flagyl Ceftazidime  Studies/Events: 4/16 tx to ICU for worsening shock CT abd/pelvis 4/16: Postsurgical changes in the upper abdomen. Residual low density  fluid or edema around the liver has minimally changed since  07/29/2011.  Tiny pleural effusions with bibasilar atelectasis  RUE [place] (informal)4/16>>>no abscess, probable clot  Consults:  ID  Assessment and Plan: Pulmonary:  A: Some vascular congestion  P: Will follow for signs of distress, getting fluids for resuscitation and will slow today.   Cardiac:  A: No acute AMI - CE taken on admission negative and no significant heart history.  P: Continue to monitor, tele.  Neurologic:  A: Delirium/Acute Encephalopathy - A and O times 1-2, will continue to  monitor with fluids  Dementia - at baseline, no family to talk to today. Will watch. P: Continue to monitor, avoid benzo, PT when able  GI:  A: S/P multiple abdominal surgeries - PEG to suction, draining bilious fluid, non-bloody. One wound has some purulent material on it, does not appear to be open. No signs of obstruction and + BS today. Denies abdominal pain currently. 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 Dif - Treated in Feb, no active diarrhea. P: Hydrate aggresively, levophed, PEG to suction, nutrition for TNA, Ct reviewed, no changes  CVS  A: Shock - Likely due to bacteremia is on levophed and vasopressin.  Acute Anemia - Likely anemia of critical illness. One unit of PRBC ordered this AM for Hg 7. No signs active bleeding. ? Clot in R UE - bedside US showed no abscess but possible clot. Will get formal doppler when stable. P: Fluids aggressive, cvp, levophed to MAP goal 60 now off, dobut added for EGDT, dc at 24 hrs  1 unit PRBC this AM for EGDT , no signs of active bleeding. Will monitor closely.. At 24 hrs change hgb goal to 7 less cvp 13 noted, kvo  Renal:  A: Hypokalemia - Repleted, will follow  AKI - Resolved, Cr 0.64 today. Hyponatremia - Better with fluids, will continue. Likely due to volume loss from GI tract/short gut syndrome. P: BMP in AM, small NON AG from stools loss, kvo, follow I/O closely at risk neg balance from output  Endocrine:  A: DM II - CBG check with SSI as needed. No thyroid disease.  Metabolic acidosis - AG on admission, none currently (AG 9 this AM). From lactate levels which have resolved.  P: CBG monitoring with SSI as needed.  Cortisol 47 , dc roids  Infectious Disease:  A: Bacteremia - blood cultures Gram + cocci clusters and GPR. Some likely contaminant but covered. On Vanc/flagyl/ceftaz. ID seeing, await urine and catheter tip cultures. Repeat blood cultures pending. C. Dif - Treated in Feb, no  active diarrhea this admission. Will watch closely. At risk septic thrombophlebitis P:  Vanc/flagyl/Ceftaz continue and await cultures. Doppler arm Clusters noted, likely the pathogen, GPRod , likley contam  Best Practice: DVT lovenox dc , to sub q hep SUP: protonix Nutrition: NPO, suction from PEG Glycemic control: CBG monitoring SSI as needed Sedation/analgesia: tylenol  Genella Mech 10/14/2011, 7:54 AM  I have fully examined this patient and agree with above findings.    And edited.   Mcarthur Rossetti. Tyson Alias, MD, FACP Pgr: [phone] [facility]

## 2011-10-14 NOTE — Progress Notes (Signed)
This CSW familiar with pt due to previous admits.  CSW contacted pt's SNF to obtain contact information:  [name] [phone]   Assessment to follow.   Angelia Mould, MSW, [place] [phone]

## 2011-10-14 NOTE — Progress Notes (Signed)
INITIAL ADULT NUTRITION ASSESSMENT Date: 10/14/2011   Time: 11:25 AM  Reason for Assessment: MD Consult; New TPN  ASSESSMENT: Female 78 y.o.  Dx: Sepsis  Hx:  Past Medical History  Diagnosis Date  . Hypertension   . Reflux   . Right elbow pain     OTIF  . Dementia   . Depression   . Osteoarthritis   . Pancreatitis 11/2007    HOP  . Angiomyolipoma of kidney     right  . Ulcerative esophagitis 12/05/2007    hx elevated gastrin, severe on EGD by Dr Jena Gauss , h pylori negative  . Hiatal hernia   . S/P colonoscopy 2009    pt reports normal by Dr Lovell Sheehan  . Diabetes mellitus   . Anemia   . Hyponatremia   . Cellulitis   . Esophagitis   . Peptic ulcer   . Insomnia     Related Meds:  Scheduled Meds:   . cefTAZidime (FORTAZ)  IV  1 g Intravenous Q8H  . heparin subcutaneous  5,000 Units Subcutaneous Q8H  . metronidazole  500 mg Intravenous Q8H  . pantoprazole (PROTONIX) IV  40 mg Intravenous Q24H  . potassium chloride  10 mEq Intravenous Q1 Hr x 5  . sodium chloride  25 mL/kg Intravenous Once  . vancomycin  1,250 mg Intravenous Q12H  . DISCONTD: sodium chloride   Intravenous STAT  . DISCONTD: ampicillin-sulbactam (UNASYN) IV  3 g Intravenous STAT  . DISCONTD: aztreonam  1 g Intravenous Q8H  . DISCONTD: aztreonam  1 g Intravenous Q8H  . DISCONTD: aztreonam  2 g Intravenous Once  . DISCONTD: enoxaparin  40 mg Subcutaneous Q24H  . DISCONTD: heparin  5,000 Units Intravenous TID  . DISCONTD: insulin aspart  0-9 Units Subcutaneous TID WC  . DISCONTD: metronidazole  500 mg Intravenous Once  . DISCONTD: potassium chloride  40 mEq Oral Q4H  . DISCONTD: sodium chloride  500 mL Intravenous Once  . DISCONTD: sodium chloride  3 mL Intravenous Q12H  . DISCONTD: vancomycin  1,000 mg Intravenous Once   Continuous Infusions:   . sodium chloride 20 mL/hr at 10/14/11 1113  . sodium chloride 10 mL/hr at 10/14/11 1100  . vasopressin (PITRESSIN) infusion - *FOR SHOCK*     PRN  Meds:.acetaminophen, acetaminophen, DOBUTamine, norepinephrine (LEVOPHED) Adult infusion, ondansetron (ZOFRAN) IV, ondansetron, sodium chloride, vasopressin (PITRESSIN) infusion - *FOR SHOCK*   Ht: 5\' 5"  (165.1 cm)  Wt: 184 lb 1.4 oz (83.5 kg)  Ideal Wt: 56.8 kg % Ideal Wt: 147%  Usual Wt: 155-162 lb in January 2013 % Usual Wt: 115%  Weight fluctuation may be related to fluid shifts.  Body mass index is 30.63 kg/(m^2).  Food/Nutrition Related Hx: Has a PEG, but was receiving TPN via PICC PTA; No TF, only TPN due to short gut syndrome.  Has previously been on TF via PEG, but unsure when this was stopped.  Labs:  CMP     Component Value Date/Time   NA 134* 10/14/2011 0500   K 3.1* 10/14/2011 0500   CL 106 10/14/2011 0500   CO2 19 10/14/2011 0500   GLUCOSE 112* 10/14/2011 0500   BUN 15 10/14/2011 0500   CREATININE 0.64 10/14/2011 0500   CALCIUM 7.8* 10/14/2011 0500   PROT 5.2* 10/14/2011 0500   ALBUMIN 1.8* 10/14/2011 0500   AST 46* 10/14/2011 0500   ALT 57* 10/14/2011 0500   ALKPHOS 113 10/14/2011 0500   BILITOT 0.3 10/14/2011 0500   GFRNONAA 83* 10/14/2011 0500  GFRAA >90 10/14/2011 0500    CBG (last 3)   Basename 10/14/11 0811 10/13/11 1643 10/13/11 1324  GLUCAP 109* 95 116*    Intake/Output Summary (Last 24 hours) at 10/14/11 1141 Last data filed at 10/14/11 1125  Gross per 24 hour  Intake 4980.8 ml  Output   3765 ml  Net 1215.8 ml    Diet Order:  NPO  TPN being resumed today.  IVF:    sodium chloride Last Rate: 20 mL/hr at 10/14/11 1113  sodium chloride Last Rate: 10 mL/hr at 10/14/11 1100  vasopressin (PITRESSIN) infusion - *FOR SHOCK*     Estimated Nutritional Needs:   Kcal: 1650-1850 Protein: 85-100 grams Fluid: 1.65-1.85 liters  Currently PEG is to suction with bilious drainage.  Patient receiving home TPN due to gastroileal fistula with short gut syndrome.  TPN being resumed today.  Unsure of usual TPN prescription--is supplied by [facility].   Noted recent weight gain--possibly related to positive fluid balance versus receiving too many calories with TPN.  Calculated estimated nutritional needs using usual weight of 162 lb.  NUTRITION DIAGNOSIS: -Altered GI function (NI-1.4).  Status: Ongoing  RELATED TO: gastroileal fistula with short gut syndrome  AS EVIDENCED BY: NPO status with need for TPN  MONITORING/EVALUATION(Goals): Goal:  TPN to meet nutrition needs. Monitor TPN with Pharmacy, labs, weight trend/fluid status.  EDUCATION NEEDS: -Education not appropriate at this time  INTERVENTION: RD to monitor TPN with Pharmacy. Will assist with transition to enteral nutrition when/if possible.  Dietitian #:  [phone]  DOCUMENTATION CODES Per approved criteria  -Not Applicable    Hettie Holstein 10/14/2011, 11:25 AM

## 2011-10-14 NOTE — Progress Notes (Signed)
Subjective: Pt is c/o wanting water  Antibiotics:  Anti-infectives     Start     Dose/Rate Route Frequency Ordered Stop   10/13/11 2200   aztreonam (AZACTAM) 1 g in dextrose 5 % 50 mL IVPB  Status:  Discontinued        1 g 100 mL/hr over 30 Minutes Intravenous 3 times per day 10/13/11 1619 10/13/11 1639   10/13/11 1800   cefTAZidime (FORTAZ) 1 g in dextrose 5 % 50 mL IVPB        1 g 100 mL/hr over 30 Minutes Intravenous Every 8 hours 10/13/11 1640     10/13/11 1615   aztreonam (AZACTAM) 2 g in dextrose 5 % 50 mL IVPB  Status:  Discontinued        2 g 100 mL/hr over 30 Minutes Intravenous  Once 10/13/11 1611 10/13/11 1612   10/13/11 1615   vancomycin (VANCOCIN) IVPB 1000 mg/200 mL premix  Status:  Discontinued        1,000 mg 200 mL/hr over 60 Minutes Intravenous  Once 10/13/11 1611 10/13/11 1612   10/13/11 1615   metroNIDAZOLE (FLAGYL) IVPB 500 mg  Status:  Discontinued        500 mg 100 mL/hr over 60 Minutes Intravenous  Once 10/13/11 1611 10/13/11 1612   10/13/11 1430   Ampicillin-Sulbactam (UNASYN) 3 g in sodium chloride 0.9 % 100 mL IVPB  Status:  Discontinued        3 g 100 mL/hr over 60 Minutes Intravenous STAT 10/13/11 1417 10/13/11 1444   10/13/11 0600   vancomycin (VANCOCIN) 1,250 mg in sodium chloride 0.9 % 250 mL IVPB        1,250 mg 166.7 mL/hr over 90 Minutes Intravenous Every 12 hours 10/13/11 0457     10/13/11 0600   aztreonam (AZACTAM) 1 g in dextrose 5 % 50 mL IVPB  Status:  Discontinued        1 g 100 mL/hr over 30 Minutes Intravenous 3 times per day 10/13/11 0507 10/13/11 1526   10/13/11 0400   metroNIDAZOLE (FLAGYL) IVPB 500 mg        500 mg 100 mL/hr over 60 Minutes Intravenous Every 8 hours 10/13/11 0323     10/12/11 1915   vancomycin (VANCOCIN) IVPB 1000 mg/200 mL premix        1,000 mg 200 mL/hr over 60 Minutes Intravenous  Once 10/12/11 1901 10/12/11 2227   10/12/11 1915   ceFEPIme (MAXIPIME) 2 g in dextrose 5 % 50 mL IVPB        2 g 100 mL/hr  over 30 Minutes Intravenous To Minor Emergency Dept 10/12/11 1901 10/12/11 2105          Medications: Scheduled Meds:   . cefTAZidime (FORTAZ)  IV  1 g Intravenous Q8H  . heparin subcutaneous  5,000 Units Subcutaneous Q8H  . insulin aspart  0-9 Units Subcutaneous Q4H  . metronidazole  500 mg Intravenous Q8H  . pantoprazole (PROTONIX) IV  40 mg Intravenous Q24H  . potassium chloride  10 mEq Intravenous Q1 Hr x 5  . sodium chloride  25 mL/kg Intravenous Once  . vancomycin  1,250 mg Intravenous Q12H  . DISCONTD: aztreonam  1 g Intravenous Q8H  . DISCONTD: aztreonam  2 g Intravenous Once  . DISCONTD: enoxaparin  40 mg Subcutaneous Q24H  . DISCONTD: heparin  5,000 Units Intravenous TID  . DISCONTD: metronidazole  500 mg Intravenous Once  . DISCONTD: potassium chloride  40 mEq Oral  Q4H  . DISCONTD: sodium chloride  500 mL Intravenous Once  . DISCONTD: vancomycin  1,000 mg Intravenous Once   Continuous Infusions:   . sodium chloride 20 mL/hr at 10/14/11 1113  . sodium chloride 20 mL/hr at 10/14/11 1100  . TPN (CLINIMIX) +/- additives     And  . fat emulsion    . vasopressin (PITRESSIN) infusion - *FOR SHOCK*     PRN Meds:.acetaminophen, acetaminophen, DOBUTamine, norepinephrine (LEVOPHED) Adult infusion, ondansetron (ZOFRAN) IV, ondansetron, sodium chloride, vasopressin (PITRESSIN) infusion - *FOR SHOCK*   Objective: Weight change: 1 lb 15.8 oz (0.9 kg)  Intake/Output Summary (Last 24 hours) at 10/14/11 1609 Last data filed at 10/14/11 1500  Gross per 24 hour  Intake 3524.2 ml  Output   2915 ml  Net  609.2 ml   Blood pressure 157/72, pulse 103, temperature 100.7 F (38.2 C), temperature source Core (Comment), resp. rate 26, height 5\' 5"  (1.651 m), weight 184 lb 1.4 oz (83.5 kg), SpO2 100.00%. Temp:  [98 F (36.7 C)-102 F (38.9 C)] 100.7 F (38.2 C) (04/17 1500) Pulse Rate:  [86-127] 103  (04/17 1500) Resp:  [22-32] 26  (04/17 1500) BP: (95-157)/(45-134) 157/72 mmHg  (04/17 1500) SpO2:  [96 %-100 %] 100 % (04/17 1500) Weight:  [184 lb 1.4 oz (83.5 kg)] 184 lb 1.4 oz (83.5 kg) (04/17 0500)  Physical Exam: General: Alert and awake, Chronically ill appearing, fatigued oriented x3,  HEENT: anicteric sclera, pupils reactive to light and accommodation, EOMI, oropharynx clear and without exudate  CVS tachycardic, normal r, no murmur rubs or gallops  Chest: clear to auscultation bilaterally, no wheezing, rales or rhonchi  Abdomen: soft tender in lower quadratns, nondistended, normal bowel sounds,  Extremities: right upper extremity with induration at picc insertion site and pt unable to fully extend arm secondary to pain  Neuro: nonfocal, strength and sensation intact   Lab Results:  Basename 10/14/11 1030 10/14/11 0500  WBC 8.8 8.4  HGB 8.5* 7.0*  HCT 25.3* 21.0*  PLT 154 153    BMET  Basename 10/14/11 0500 10/13/11 1630  NA 134* 133*  K 3.1* 3.7  CL 106 104  CO2 19 18*  GLUCOSE 112* 115*  BUN 15 19  CREATININE 0.64 0.75  CALCIUM 7.8* 7.9*    Micro Results: Recent Results (from the past 240 hour(s))  URINE CULTURE     Status: Normal (Preliminary result)   Collection Time   10/12/11  7:52 PM      Component Value Range Status Comment   Specimen Description URINE, CATHETERIZED   Final    Special Requests ADDED 10/12/11 2017   Final    Culture  Setup Time [id number]   Final    Colony Count 65,000 COLONIES/ML   Final    Culture ESCHERICHIA COLI   Final    Report Status PENDING   Incomplete   CULTURE, BLOOD (ROUTINE X 2)     Status: Normal (Preliminary result)   Collection Time   10/12/11  8:00 PM      Component Value Range Status Comment   Specimen Description BLOOD HAND LEFT   Final    Special Requests     Final    Value: BOTTLES DRAWN AEROBIC AND ANAEROBIC AERO 10CC, ANAE 5CC   Culture  Setup Time [id number]   Final    Culture     Final    Value: GRAM POSITIVE RODS     GRAM POSITIVE COCCI IN CHAINS     Note:  Gram Stain Report  Called to,Read Back By and Verified With: CHRISTY FRALEY @1132  10/13/11 BY KRAWS   Report Status PENDING   Incomplete   CULTURE, BLOOD (ROUTINE X 2)     Status: Normal (Preliminary result)   Collection Time   10/12/11  8:13 PM      Component Value Range Status Comment   Specimen Description BLOOD HAND RIGHT   Final    Special Requests BOTTLES DRAWN AEROBIC ONLY 10CC   Final    Culture  Setup Time [id number]   Final    Culture     Final    Value: GRAM POSITIVE COCCI IN CLUSTERS     Note: Gram Stain Report Called to,Read Back By and Verified With: LAUREN CONNALLY @ 1828 ON 10/13/11 BY GOLLD   Report Status PENDING   Incomplete   CATH TIP CULTURE     Status: Normal (Preliminary result)   Collection Time   10/13/11  3:47 AM      Component Value Range Status Comment   Specimen Description CATH TIP   Final    Special Requests NONE   Final    Culture Culture reincubated for better growth   Final    Report Status PENDING   Incomplete   MRSA PCR SCREENING     Status: Normal   Collection Time   10/13/11  3:51 AM      Component Value Range Status Comment   MRSA by PCR NEGATIVE  NEGATIVE  Final   MRSA PCR SCREENING     Status: Normal   Collection Time   10/13/11  3:35 PM      Component Value Range Status Comment   MRSA by PCR NEGATIVE  NEGATIVE  Final     Studies/Results: Ct Abdomen Pelvis W Contrast  10/13/2011  *RADIOLOGY REPORT*  Clinical Data: 78 year old with sepsis.  CT ABDOMEN AND PELVIS WITH CONTRAST  Technique:  Multidetector CT imaging of the abdomen and pelvis was performed following the standard protocol during bolus administration of intravenous contrast.  Contrast: 80mL OMNIPAQUE IOHEXOL 300 MG/ML  SOLN  Comparison: 07/29/2011  Findings: There are tiny bilateral pleural effusions with basilar atelectasis.  A small amount of focal atelectasis or round atelectasis at the right lung base on sequence 3, image 7.  No evidence for free air.  Coronary artery calcifications.  The gallbladder  has been removed. Again noted is low density fluid or postsurgical changes between the stomach and left hepatic lobe, best seen on sequence 2, image 23.  This area roughly measures 2.1 cm and similar to the prior examination.  There is residual fluid or edema in the gallbladder fossa and along the inferior liver margin.  No evidence for a drainable fluid collection.  Gastrostomy tube is present.  Again noted is a large calcification near the pancreatic head and neck. There is oral contrast within the stomach and large bowel.  No gross abnormality to the liver, portal venous system, adrenal glands, kidneys or spleen.  Again noted are low density structures within the kidneys which are too small to definitively characterize but could represent cysts.  Calcifications associated with the uterus and cannot exclude fibroids.  No significant free fluid in the pelvis.  There is a right hip replacement.  No significant lymphadenopathy. A Foley catheter within the urinary bladder.  IMPRESSION:  Postsurgical changes in the upper abdomen.  Residual low density fluid or edema around the liver has minimally changed since 07/29/2011.  Tiny pleural effusions with bibasilar atelectasis.  Original Report Authenticated By: Richarda Overlie, M.D.   Dg Chest Port 1 View  10/13/2011  *RADIOLOGY REPORT*  Clinical Data: Line placement.  PORTABLE CHEST - 1 VIEW  Comparison: 10/12/2011  Findings: Cardiomegaly with vascular congestion.  Diffuse interstitial prominence may reflect early interstitial edema.  No confluent opacities or effusions.  No acute bony abnormality.  Left central line is in place.  The tip is in the upper right atrium.  No pneumothorax.  IMPRESSION: Cardiomegaly, vascular congestion.  Question early interstitial edema.  Original Report Authenticated By: Cyndie Chime, M.D.   Dg Chest Port 1 View  10/12/2011  *RADIOLOGY REPORT*  Clinical Data: Fever.  Drainage about a PICC.  PORTABLE CHEST - 1 VIEW  Comparison:  Fluoroscopic spot view from PICC placement 09/29/2011. Plain films of the chest 07/28/2011.  CT chest 01/26/2011.  Findings: The patient's right PICC has backed out with the tip in the right axilla.  Lungs are clear.  Heart size is normal.  No pneumothorax or pleural effusion.  IMPRESSION:  1.  Tip of the patient's right PICC is now in the right axilla. 2.  No acute cardiopulmonary disease.  Original Report Authenticated By: Bernadene Bell. Maricela Curet, M.D.   Dg Abd Portable 2v  10/13/2011  *RADIOLOGY REPORT*  Clinical Data: Abdominal pain.  PORTABLE ABDOMEN - 2 VIEW  Comparison: CT 10/13/2011  Findings: Oral contrast material seen within the colon.  There is a nonobstructive bowel gas pattern.  No free air on the decubitus views.  No organomegaly.  IMPRESSION: No obstruction or free air.  Original Report Authenticated By: Cyndie Chime, M.D.      Assessment/Plan: [name] is a 78 y.o. female admtited withseptic physiology and confusing micro findings  1) Sepsis: Confusing picture. She has GPCpairs and GPR in one culture from blood and GPCC in the other and 65k E coli in urine, Cath tip culture still incubating. Differential for her sepsis includes infected PICC line (most likely) possibly with septic thrombophlebitis --continue vancomycin, flagyl ceftazidime --would consider dropping GNR coverage of urine hopefully as picture becomes more clear    LOS: 2 days   Acey Lav 10/14/2011, 4:09 PM

## 2011-10-14 NOTE — Progress Notes (Signed)
VASCULAR LAB PRELIMINARY  PRELIMINARY  PRELIMINARY  PRELIMINARY  Right upper extremity venous duplex completed.    Preliminary report:  DVT noted in the right subclavian vein, axillary vein and brachial vein.  Radial and ulnar veins patent.  Cephalic and basilic veins patent.  Terance Hart, RVT 10/14/2011, 5:06 PM

## 2011-10-14 NOTE — Progress Notes (Signed)
MD notified of doppler results-DVT found in R subclavian, axillary, and brachial veins.  Plan to start heparin gtt.  Will continue to monitor.

## 2011-10-14 NOTE — Progress Notes (Signed)
PARENTERAL NUTRITION CONSULT NOTE - INITIAL  Pharmacy Consult for TPN Indication: chronic TPN at home for short - gut syndrome  Allergies  Allergen Reactions  . Other Swelling    Pinto beans cause swelling and hives  . Penicillins Swelling    Patient Measurements: Height: 5\' 5"  (165.1 cm) Weight: 184 lb 1.4 oz (83.5 kg) IBW/kg (Calculated) : 57  Adjusted Body Weight:  Usual Weight: 162 lbs  Vital Signs: Temp: 99.1 F (37.3 C) (04/17 1200) Temp src: Core (Comment) (04/17 1200) BP: 152/68 mmHg (04/17 1200) Pulse Rate: 100  (04/17 1200) Intake/Output from previous day: 04/16 0701 - 04/17 0700 In: 5257.4 [P.O.:650; I.V.:3447.4; IV Piggyback:910] Out: 2965 [Urine:2565; Drains:400] Intake/Output from this shift: Total I/O In: 1126.5 [I.V.:475.5; Blood:351; IV Piggyback:300] Out: 800 [Urine:400; Drains:400]  Labs:  [facility] 10/14/11 1030 10/14/11 0500 10/13/11 1630  WBC 8.8 8.4 11.2*  HGB 8.5* 7.0* 8.3*  HCT 25.3* 21.0* 24.8*  PLT 154 153 172  APTT -- -- 53*  INR -- -- 1.58*     Basename 10/14/11 1100 10/14/11 0500 10/13/11 1630 10/13/11 0530  NA -- 134* 133* 135  K -- 3.1* 3.7 4.3  CL -- 106 104 106  CO2 -- 19 18* 19  GLUCOSE -- 112* 115* 146*  BUN -- 15 19 30*  CREATININE -- 0.64 0.75 1.02  LABCREA -- -- -- --  CREAT24HRUR -- -- -- --  CALCIUM -- 7.8* 7.9* 7.7*  MG 1.5 -- -- --  PHOS 2.8 -- -- --  PROT -- 5.2* 6.1 5.9*  ALBUMIN -- 1.8* 2.1* 2.1*  AST -- 46* 73* 89*  ALT -- 57* 76* 72*  ALKPHOS -- 113 145* 133*  BILITOT -- 0.3 0.4 0.2*  BILIDIR -- -- -- --  IBILI -- -- -- --  PREALBUMIN -- -- -- --  TRIG -- -- -- --  CHOLHDL -- -- -- --  CHOL -- -- -- --   Estimated Creatinine Clearance: 61.8 ml/min (by C-G formula based on Cr of 0.64).    Basename 10/14/11 1206 10/14/11 0811 10/13/11 1643  GLUCAP 114* 109* 95    Medical History: Past Medical History  Diagnosis Date  . Hypertension   . Reflux   . Right elbow pain     OTIF  . Dementia     . Depression   . Osteoarthritis   . Pancreatitis 11/2007    HOP  . Angiomyolipoma of kidney     right  . Ulcerative esophagitis 12/05/2007    hx elevated gastrin, severe on EGD by Dr Jena Gauss , h pylori negative  . Hiatal hernia   . S/P colonoscopy 2009    pt reports normal by Dr Lovell Sheehan  . Diabetes mellitus   . Anemia   . Hyponatremia   . Cellulitis   . Esophagitis   . Peptic ulcer   . Insomnia     Medications:  Prescriptions prior to admission  Medication Sig Dispense Refill  . acetaminophen (TYLENOL) 500 MG tablet Take 500 mg by mouth daily as needed. For pain      . amLODipine (NORVASC) 5 MG tablet Take 5 mg by mouth daily.      . benazepril (LOTENSIN) 20 MG tablet Take 20 mg by mouth daily.       . butalbital-acetaminophen-caffeine (FIORICET, ESGIC) 50-325-40 MG per tablet Take 2 tablets by mouth every 8 (eight) hours as needed. For headache      . calcium-vitamin D (OSCAL WITH D) 500-200 MG-UNIT per tablet  Take 1 tablet by mouth daily.       . cloNIDine (CATAPRES - DOSED IN MG/24 HR) 0.1 mg/24hr patch Place 1 patch onto the skin once a week. Change on Mondays      . darifenacin (ENABLEX) 15 MG 24 hr tablet Take 15 mg by mouth daily.       . ergocalciferol (VITAMIN D2) 50000 UNITS capsule Take 50,000 Units by mouth once a week.      . insulin aspart (NOVOLOG) 100 UNIT/ML injection Inject 3 Units into the skin 3 (three) times daily before meals. Sliding scale-give 3 units sq for CBG >150      . insulin regular (NOVOLIN R,HUMULIN R) 100 units/mL injection Inject 65 Units into the skin daily.      . Loperamide HCl (IMODIUM PO) Take 2 mg by mouth every 4 (four) hours as needed. For diarrhea      . metFORMIN (GLUCOPHAGE) 1000 MG tablet Take 1,000 mg by mouth daily with breakfast.       . metoprolol tartrate (LOPRESSOR) 12.5 mg TABS Take 12.5 mg by mouth 2 (two) times daily.      . Multiple Vitamin (MULITIVITAMIN WITH MINERALS) TABS Take 1 tablet by mouth daily.      . ondansetron  (ZOFRAN) 4 MG tablet Take 4 mg by mouth every 8 (eight) hours as needed. For nausea       . oxyCODONE (ROXICODONE) 5 MG/5ML solution Take 5 mg by mouth every 4 (four) hours as needed. For pain      . pantoprazole (PROTONIX) 40 MG tablet Take 40 mg by mouth 2 (two) times daily before a meal.      . PRESCRIPTION MEDICATION Inject into the vein continuous. Home TPN.  Patient gets supply from [facility] [phone]).      . QUEtiapine (SEROQUEL) 25 MG tablet Take 25 mg by mouth at bedtime.       . traZODone (DESYREL) 50 MG tablet Take 25-50 mg by mouth at bedtime as needed. For sleep        Insulin Requirements in the past 24 hours:  None.  Current Nutrition: No diet ordered.  PO: carb modified diet started 1/30.  Clinimix E 5/15 at 80 ml/hr and lipids at 10 ml/hr MWF: provides weekly average of 1569 kcl and 96 protein per day.   Nutritional Goals:  1650-1850 kCal, 85-100 grams of protein per day   Assessment:  [name] admitted from [facility] where she is on chronic TPN and takes POs only for pleasure. History of erosive esophagitis with perf pyloric ulcer s/p lap 11/7. s/p gastrostomy, gastrectomy, choleycystectomy. 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 Treated for c diff 2/13.  On 4/15 brought to ER w/ fever - PICC line infection and possible UTI.  Currently, PEG is to suction draining bilious, non-bloody fluid.  Plan:  1. Start clinimix E5/15 at 80 ml/hr with lipids at 8 ml/hr, to closely match home formula  2. MVI, TE, MWF only due to national shortage  3. Home formula noted to have 65 units/bag ( ) which equals 29 units/L of insulin.  Will start with slightly decreased insulin of 40 units/bag (20 units/L)  4. Start SSI sens q4h while on tna  5. F/u am tna labs  [employer]. Illene Bolus, PharmD, BCPS Clinical Pharmacist Pager: [phone] 10/14/2011 1:26 PM

## 2011-10-15 LAB — URINE CULTURE
Colony Count: >=100000
Culture  Setup Time: 201304152144

## 2011-10-15 LAB — GLUCOSE, CAPILLARY
Glucose-Capillary: 163 mg/dL — ABNORMAL HIGH (ref 70–99)
Glucose-Capillary: 161 mg/dL — ABNORMAL HIGH (ref 70–99)
Glucose-Capillary: 163 mg/dL — ABNORMAL HIGH (ref 70–99)
Glucose-Capillary: 138 mg/dL — ABNORMAL HIGH (ref 70–99)

## 2011-10-15 LAB — PROTIME-INR
INR: 1.47 (ref 0.00–1.49)
Prothrombin Time: 18.1 seconds — ABNORMAL HIGH (ref 11.6–15.2)

## 2011-10-15 LAB — BASIC METABOLIC PANEL
BUN: 9 mg/dL (ref 6–23)
CO2: 23 mEq/L (ref 19–32)
Calcium: 8.4 mg/dL (ref 8.4–10.5)
Chloride: 96 mEq/L (ref 96–112)
Creatinine, Ser: 0.53 mg/dL (ref 0.50–1.10)
Glucose, Bld: 159 mg/dL — ABNORMAL HIGH (ref 70–99)

## 2011-10-15 LAB — TYPE AND SCREEN

## 2011-10-15 LAB — CBC
MCH: 26.6 pg (ref 26.0–34.0)
Platelets: 167 10*3/uL (ref 150–400)
RBC: 3.2 MIL/uL — ABNORMAL LOW (ref 3.87–5.11)
RDW: 18.1 % — ABNORMAL HIGH (ref 11.5–15.5)
WBC: 7.8 10*3/uL (ref 4.0–10.5)

## 2011-10-15 LAB — COMPREHENSIVE METABOLIC PANEL
ALT: 40 U/L — ABNORMAL HIGH (ref 0–35)
AST: 20 U/L (ref 0–37)
Albumin: 1.9 g/dL — ABNORMAL LOW (ref 3.5–5.2)
Alkaline Phosphatase: 119 U/L — ABNORMAL HIGH (ref 39–117)
CO2: 22 mEq/L (ref 19–32)
Chloride: 98 mEq/L (ref 96–112)
Creatinine, Ser: 0.5 mg/dL (ref 0.50–1.10)
GFR calc non Af Amer: >90 mL/min (ref >90)
Potassium: 2.4 mEq/L — CL (ref 3.5–5.1)
Total Bilirubin: 0.4 mg/dL (ref 0.3–1.2)

## 2011-10-15 LAB — DIFFERENTIAL
Basophils Absolute: 0 10*3/uL (ref 0.0–0.1)
Basophils Relative: 0 % (ref 0–1)
Eosinophils Absolute: 0.1 10*3/uL (ref 0.0–0.7)
Lymphs Abs: 1.1 10*3/uL (ref 0.7–4.0)
Neutrophils Relative %: 79 % — ABNORMAL HIGH (ref 43–77)

## 2011-10-15 LAB — PHOSPHORUS
Phosphorus: 3.2 mg/dL (ref 2.3–4.6)
Phosphorus: 3.1 mg/dL (ref 2.3–4.6)

## 2011-10-15 LAB — PREALBUMIN: Prealbumin: 5 mg/dL — ABNORMAL LOW (ref 17.0–34.0)

## 2011-10-15 LAB — MAGNESIUM: Magnesium: 2.6 mg/dL — ABNORMAL HIGH (ref 1.5–2.5)

## 2011-10-15 LAB — CHOLESTEROL, TOTAL: Cholesterol: 79 mg/dL (ref 0–200)

## 2011-10-15 LAB — HEPARIN LEVEL (UNFRACTIONATED): Heparin Unfractionated: 0.13 IU/mL — ABNORMAL LOW (ref 0.30–0.70)

## 2011-10-15 MED ORDER — HEPARIN BOLUS VIA INFUSION
2500.0000 [IU] | Freq: Once | INTRAVENOUS | Status: AC
  Administered 2011-10-15: 2500 [IU] via INTRAVENOUS
  Filled 2011-10-15: qty 2500

## 2011-10-15 MED ORDER — INSULIN REGULAR HUMAN 100 UNIT/ML IJ SOLN
INTRAVENOUS | Status: DC
  Administered 2011-10-15: 17:00:00 via INTRAVENOUS
  Filled 2011-10-15: qty 2000

## 2011-10-15 MED ORDER — FAT EMULSION 20 % IV EMUL
250.0000 mL | INTRAVENOUS | Status: DC
  Administered 2011-10-15: 250 mL via INTRAVENOUS
  Filled 2011-10-15: qty 250

## 2011-10-15 MED ORDER — POTASSIUM CHLORIDE 10 MEQ/50ML IV SOLN
10.0000 meq | INTRAVENOUS | Status: AC
  Administered 2011-10-15 (×6): 10 meq via INTRAVENOUS
  Filled 2011-10-15 (×6): qty 50

## 2011-10-15 MED ORDER — POTASSIUM CHLORIDE 10 MEQ/100ML IV SOLN: 10.0000 meq | INTRAVENOUS | Status: DC

## 2011-10-15 MED ORDER — MAGNESIUM SULFATE 40 MG/ML IJ SOLN
2.0000 g | Freq: Once | INTRAMUSCULAR | Status: DC
  Filled 2011-10-15: qty 50

## 2011-10-15 MED ORDER — MAGNESIUM SULFATE 50 % IJ SOLN
6.0000 g | Freq: Once | INTRAVENOUS | Status: AC
  Administered 2011-10-15: 6 g via INTRAVENOUS
  Filled 2011-10-15: qty 12

## 2011-10-15 MED ORDER — VANCOMYCIN HCL IN DEXTROSE 1-5 GM/200ML-% IV SOLN
1000.0000 mg | Freq: Three times a day (TID) | INTRAVENOUS | Status: DC
  Administered 2011-10-15 – 2011-10-18 (×9): 1000 mg via INTRAVENOUS
  Filled 2011-10-15 (×12): qty 200

## 2011-10-15 MED ORDER — HEPARIN (PORCINE) IN NACL 100-0.45 UNIT/ML-% IJ SOLN
1700.0000 [IU]/h | INTRAMUSCULAR | Status: DC
  Administered 2011-10-16: 1850 [IU]/h via INTRAVENOUS
  Administered 2011-10-16: 1750 [IU]/h via INTRAVENOUS
  Administered 2011-10-17 – 2011-10-20 (×6): 1850 [IU]/h via INTRAVENOUS
  Administered 2011-10-21 – 2011-10-25 (×8): 1700 [IU]/h via INTRAVENOUS
  Filled 2011-10-15 (×21): qty 250

## 2011-10-15 MED ORDER — POTASSIUM CHLORIDE 10 MEQ/50ML IV SOLN
10.0000 meq | INTRAVENOUS | Status: AC
  Administered 2011-10-15 (×2): 10 meq via INTRAVENOUS
  Filled 2011-10-15 (×2): qty 50

## 2011-10-15 MED ORDER — FENTANYL CITRATE 0.05 MG/ML IJ SOLN
12.5000 ug | INTRAMUSCULAR | Status: DC | PRN
  Administered 2011-10-15: 25 ug via INTRAVENOUS
  Administered 2011-10-15: 12.5 ug via INTRAVENOUS
  Administered 2011-10-15 (×2): 25 ug via INTRAVENOUS
  Administered 2011-10-15: 12.5 ug via INTRAVENOUS
  Filled 2011-10-15 (×6): qty 2

## 2011-10-15 NOTE — Progress Notes (Signed)
Night Float CCM Resident Progress Note.  RN called to notify K= 2.4.  Magnesium = 1.3, d/t GI losses?  Plan: Replete Potassium ( IV x 6, patient also receiving TNA) Replete Magnesium Repeat BMET at 5pm

## 2011-10-15 NOTE — Progress Notes (Signed)
ANTICOAGULATION CONSULT NOTE - Follow-Up Consult  Pharmacy Consult for heparin Indication: DVT  Allergies  Allergen Reactions  . Other Swelling    Pinto beans cause swelling and hives  . Penicillins Swelling    Patient Measurements: Height: 5\' 5"  (165.1 cm) Weight: 180 lb 5.4 oz (81.8 kg) IBW/kg (Calculated) : 57  Heparin Dosing Weight: 74.9kg  Vital Signs: Temp: 100 F (37.8 C) (04/18 2000) Temp src: Oral (04/18 2000) BP: 151/73 mmHg (04/18 2000) Pulse Rate: 92  (04/18 2000)  Labs:  Basename 10/15/11 2055 10/15/11 1244 10/15/11 0500 10/15/11 0215 10/14/11 1030 10/14/11 0500 10/13/11 1630  HGB -- -- 8.5* -- 8.5* -- --  HCT -- -- 24.9* -- 25.3* 21.0* --  PLT -- -- 167 -- 154 153 --  APTT -- -- -- -- -- -- 53*  LABPROT -- -- -- 18.1* -- -- 19.2*  INR -- -- -- 1.47 -- -- 1.58*  HEPARINUNFRC 0.23* 0.13* -- 0.23* -- -- --  CREATININE -- -- 0.50 -- -- 0.64 0.75  CKTOTAL -- -- -- -- -- -- 213*  CKMB -- -- -- -- -- -- 2.4  TROPONINI -- -- -- -- -- -- <0.30   Estimated Creatinine Clearance: 61.2 ml/min (by C-G formula based on Cr of 0.5).  Medical History: Past Medical History  Diagnosis Date  . Hypertension   . Reflux   . Right elbow pain     OTIF  . Dementia   . Depression   . Osteoarthritis   . Pancreatitis 11/2007    HOP  . Angiomyolipoma of kidney     right  . Ulcerative esophagitis 12/05/2007    hx elevated gastrin, severe on EGD by Dr Jena Gauss , h pylori negative  . Hiatal hernia   . S/P colonoscopy 2009    pt reports normal by Dr Lovell Sheehan  . Diabetes mellitus   . Anemia   . Hyponatremia   . Cellulitis   . Esophagitis   . Peptic ulcer   . Insomnia     Medications:  Scheduled:     . cefTAZidime (FORTAZ)  IV  1 g Intravenous Q8H  . insulin aspart  0-9 Units Subcutaneous Q4H  . magnesium sulfate LVP 250-500 ml  6 g Intravenous Once  . metronidazole  500 mg Intravenous Q8H  . pantoprazole (PROTONIX) IV  40 mg Intravenous Q24H  . potassium chloride  10  mEq Intravenous Q1 Hr x 6  . potassium chloride  10 mEq Intravenous Q1 Hr x 2  . potassium chloride      . vancomycin  1,000 mg Intravenous Q8H  . DISCONTD: magnesium sulfate 1 - 4 g bolus IVPB  2 g Intravenous Once  . DISCONTD: potassium chloride  10 mEq Intravenous Q1 Hr x 6  . DISCONTD: vancomycin  1,250 mg Intravenous Q12H   Infusions:     . sodium chloride 20 mL/hr at 10/14/11 1113  . sodium chloride 20 mL/hr at 10/14/11 1100  . TPN (CLINIMIX) +/- additives 80 mL/hr at 10/14/11 1733   And  . fat emulsion 250 mL (10/14/11 1733)  . TPN (CLINIMIX) +/- additives 80 mL/hr at 10/15/11 1720   And  . fat emulsion 250 mL (10/15/11 1720)  . heparin 1,550 Units/hr (10/15/11 1400)  . vasopressin (PITRESSIN) infusion - *FOR SHOCK*      Assessment: 78 yo female with new upper extremity DVT on doppler. H/H 8.5/24.9; Plt 167; INR on 04/16 1.58 (not on coumadin PTA).  Heparin level still subtherapeutic  despite large dose increase. Will adjust again to get to goal.  Goal of Therapy:  Heparin level 0.3-0.7 units/ml   Plan:  1) Heparin bolus 2500 units x1 2) Heparin drip at 1750 units/hr 3) F/u with heparin level in AM   Ulyses Southward [place], [place] [phone] 10/15/2011,9:49 PM

## 2011-10-15 NOTE — Progress Notes (Signed)
PARENTERAL NUTRITION CONSULT NOTE - FOLLOW UP  Pharmacy Consult for TPN Indication: chronic TPN at home for short - gut syndrome  Allergies  Allergen Reactions  . Other Swelling    Pinto beans cause swelling and hives  . Penicillins Swelling    Patient Measurements: Height: 5\' 5"  (165.1 cm) Weight: 180 lb 5.4 oz (81.8 kg) IBW/kg (Calculated) : 57  Adjusted Body Weight:  Usual Weight: 162 lbs  Vital Signs: Temp: 99.2 F (37.3 C) (04/18 0812) Temp src: Oral (04/18 0812) BP: 146/79 mmHg (04/18 0800) Pulse Rate: 96  (04/18 0800) Intake/Output from previous day: 04/17 0701 - 04/18 0700 In: 3764.6 [I.V.:1020; Blood:351; IV Piggyback:1210; TPN:1183.6] Out: 1600 [Urine:900; Drains:700] Intake/Output from this shift: Total I/O In: 50 [IV Piggyback:50] Out: -   Labs:  Basename 10/15/11 0500 10/15/11 0215 10/14/11 1030 10/14/11 0500 10/13/11 1630  WBC 7.8 -- 8.8 8.4 --  HGB 8.5* -- 8.5* 7.0* --  HCT 24.9* -- 25.3* 21.0* --  PLT 167 -- 154 153 --  APTT -- -- -- -- 53*  INR -- 1.47 -- -- 1.58*     Basename 10/15/11 0500 10/14/11 1100 10/14/11 0500 10/13/11 1630  NA 129* -- 134* 133*  K 2.4* -- 3.1* 3.7  CL 98 -- 106 104  CO2 22 -- 19 18*  GLUCOSE 170* -- 112* 115*  BUN 10 -- 15 19  CREATININE 0.50 -- 0.64 0.75  LABCREA -- -- -- --  CREAT24HRUR -- -- -- --  CALCIUM 8.2* -- 7.8* 7.9*  MG 1.3* 1.5 -- --  PHOS 3.2 2.8 -- --  PROT 5.7* -- 5.2* 6.1  ALBUMIN 1.9* -- 1.8* 2.1*  AST 20 -- 46* 73*  ALT 40* -- 57* 76*  ALKPHOS 119* -- 113 145*  BILITOT 0.4 -- 0.3 0.4  BILIDIR -- -- -- --  IBILI -- -- -- --  PREALBUMIN -- -- -- --  TRIG 89 -- -- --  CHOLHDL -- -- -- --  CHOL 79 -- -- --   Estimated Creatinine Clearance: 61.2 ml/min (by C-G formula based on Cr of 0.5).    Basename 10/15/11 0748 10/15/11 0416 10/14/11 2002  GLUCAP 144* 163* 124*    Medical History: Past Medical History  Diagnosis Date  . Hypertension   . Reflux   . Right elbow pain     OTIF    . Dementia   . Depression   . Osteoarthritis   . Pancreatitis 11/2007    HOP  . Angiomyolipoma of kidney     right  . Ulcerative esophagitis 12/05/2007    hx elevated gastrin, severe on EGD by Dr Jena Gauss , h pylori negative  . Hiatal hernia   . S/P colonoscopy 2009    pt reports normal by Dr Lovell Sheehan  . Diabetes mellitus   . Anemia   . Hyponatremia   . Cellulitis   . Esophagitis   . Peptic ulcer   . Insomnia     Medications:  Prescriptions prior to admission  Medication Sig Dispense Refill  . acetaminophen (TYLENOL) 500 MG tablet Take 500 mg by mouth daily as needed. For pain      . amLODipine (NORVASC) 5 MG tablet Take 5 mg by mouth daily.      . benazepril (LOTENSIN) 20 MG tablet Take 20 mg by mouth daily.       . butalbital-acetaminophen-caffeine (FIORICET, ESGIC) 50-325-40 MG per tablet Take 2 tablets by mouth every 8 (eight) hours as needed. For headache      .  calcium-vitamin D (OSCAL WITH D) 500-200 MG-UNIT per tablet Take 1 tablet by mouth daily.       . cloNIDine (CATAPRES - DOSED IN MG/24 HR) 0.1 mg/24hr patch Place 1 patch onto the skin once a week. Change on Mondays      . darifenacin (ENABLEX) 15 MG 24 hr tablet Take 15 mg by mouth daily.       . ergocalciferol (VITAMIN D2) 50000 UNITS capsule Take 50,000 Units by mouth once a week.      . insulin aspart (NOVOLOG) 100 UNIT/ML injection Inject 3 Units into the skin 3 (three) times daily before meals. Sliding scale-give 3 units sq for CBG >150      . insulin regular (NOVOLIN R,HUMULIN R) 100 units/mL injection Inject 65 Units into the skin daily.      . Loperamide HCl (IMODIUM PO) Take 2 mg by mouth every 4 (four) hours as needed. For diarrhea      . metFORMIN (GLUCOPHAGE) 1000 MG tablet Take 1,000 mg by mouth daily with breakfast.       . metoprolol tartrate (LOPRESSOR) 12.5 mg TABS Take 12.5 mg by mouth 2 (two) times daily.      . Multiple Vitamin (MULITIVITAMIN WITH MINERALS) TABS Take 1 tablet by mouth daily.      .  ondansetron (ZOFRAN) 4 MG tablet Take 4 mg by mouth every 8 (eight) hours as needed. For nausea       . oxyCODONE (ROXICODONE) 5 MG/5ML solution Take 5 mg by mouth every 4 (four) hours as needed. For pain      . pantoprazole (PROTONIX) 40 MG tablet Take 40 mg by mouth 2 (two) times daily before a meal.      . PRESCRIPTION MEDICATION Inject into the vein continuous. Home TPN.  Patient gets supply from [facility] [phone]).      . QUEtiapine (SEROQUEL) 25 MG tablet Take 25 mg by mouth at bedtime.       . traZODone (DESYREL) 50 MG tablet Take 25-50 mg by mouth at bedtime as needed. For sleep        Insulin Requirements in the past 24 hours:  5 units  Current Nutrition: Ice chips   Nutritional Goals:  1650-1850 kCal, 85-100 grams of protein per day   Assessment:  [name] admitted from [facility] where she is on chronic TPN and takes POs only for pleasure.   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  Heparin gtt for new DVT Hepatobil: alk phos/ALT mildly elevated, AST/tbili WNL - monitor Neuro: hx depression (seroquel / trazodone PTA) ID:   Fortaz/Flagyl/Vanc D#3 for sepsis, PICC line infxn with ?UTI, PICC line replaced, Tmax 100.7, afebrile today, WBC WNL Best Practices: PPI IV, heparin  gtt   Plan:  - Continue Clinimix E5/15 at 80 ml/hr with lipids at 8 ml/hr, to closely  match home formula and meeting 100% of patient's needs - MVI and TE MWF only due to national shortage  - Increase insulin slightly to 45 units/bag - Monitor I/O's, lytes     Antoneo Ghrist D. Laney Potash, PharmD, BCPS Pager:  [phone] 10/15/2011, 8:50 AM

## 2011-10-15 NOTE — Progress Notes (Signed)
Subjective: Yesterday mx DVTs found in right arm Having foley inserted Antibiotics:  Anti-infectives     Start     Dose/Rate Route Frequency Ordered Stop   10/15/11 1400   vancomycin (VANCOCIN) IVPB 1000 mg/200 mL premix        1,000 mg 200 mL/hr over 60 Minutes Intravenous Every 8 hours 10/15/11 0634     10/13/11 2200   aztreonam (AZACTAM) 1 g in dextrose 5 % 50 mL IVPB  Status:  Discontinued        1 g 100 mL/hr over 30 Minutes Intravenous 3 times per day 10/13/11 1619 10/13/11 1639   10/13/11 1800   cefTAZidime (FORTAZ) 1 g in dextrose 5 % 50 mL IVPB        1 g 100 mL/hr over 30 Minutes Intravenous Every 8 hours 10/13/11 1640     10/13/11 1615   aztreonam (AZACTAM) 2 g in dextrose 5 % 50 mL IVPB  Status:  Discontinued        2 g 100 mL/hr over 30 Minutes Intravenous  Once 10/13/11 1611 10/13/11 1612   10/13/11 1615   vancomycin (VANCOCIN) IVPB 1000 mg/200 mL premix  Status:  Discontinued        1,000 mg 200 mL/hr over 60 Minutes Intravenous  Once 10/13/11 1611 10/13/11 1612   10/13/11 1615   metroNIDAZOLE (FLAGYL) IVPB 500 mg  Status:  Discontinued        500 mg 100 mL/hr over 60 Minutes Intravenous  Once 10/13/11 1611 10/13/11 1612   10/13/11 1430   Ampicillin-Sulbactam (UNASYN) 3 g in sodium chloride 0.9 % 100 mL IVPB  Status:  Discontinued        3 g 100 mL/hr over 60 Minutes Intravenous STAT 10/13/11 1417 10/13/11 1444   10/13/11 0600   vancomycin (VANCOCIN) 1,250 mg in sodium chloride 0.9 % 250 mL IVPB  Status:  Discontinued        1,250 mg 166.7 mL/hr over 90 Minutes Intravenous Every 12 hours 10/13/11 0457 10/15/11 0632   10/13/11 0600   aztreonam (AZACTAM) 1 g in dextrose 5 % 50 mL IVPB  Status:  Discontinued        1 g 100 mL/hr over 30 Minutes Intravenous 3 times per day 10/13/11 0507 10/13/11 1526   10/13/11 0400   metroNIDAZOLE (FLAGYL) IVPB 500 mg        500 mg 100 mL/hr over 60 Minutes Intravenous Every 8 hours 10/13/11 0323     10/12/11 1915    vancomycin (VANCOCIN) IVPB 1000 mg/200 mL premix        1,000 mg 200 mL/hr over 60 Minutes Intravenous  Once 10/12/11 1901 10/12/11 2227   10/12/11 1915   ceFEPIme (MAXIPIME) 2 g in dextrose 5 % 50 mL IVPB        2 g 100 mL/hr over 30 Minutes Intravenous To [facility] Emergency Dept 10/12/11 1901 10/12/11 2105          Medications: Scheduled Meds:    . cefTAZidime (FORTAZ)  IV  1 g Intravenous Q8H  . insulin aspart  0-9 Units Subcutaneous Q4H  . magnesium sulfate 1 - 4 g bolus IVPB  2 g Intravenous Once  . metronidazole  500 mg Intravenous Q8H  . pantoprazole (PROTONIX) IV  40 mg Intravenous Q24H  . potassium chloride  10 mEq Intravenous Q1 Hr x 5  . potassium chloride  10 mEq Intravenous Q1 Hr x 6  . potassium chloride      .  vancomycin  1,000 mg Intravenous Q8H  . DISCONTD: enoxaparin  40 mg Subcutaneous Q24H  . DISCONTD: heparin  5,000 Units Intravenous TID  . DISCONTD: heparin subcutaneous  5,000 Units Subcutaneous Q8H  . DISCONTD: potassium chloride  10 mEq Intravenous Q1 Hr x 6  . DISCONTD: vancomycin  1,250 mg Intravenous Q12H   Continuous Infusions:    . sodium chloride 20 mL/hr at 10/14/11 1113  . sodium chloride 20 mL/hr at 10/14/11 1100  . TPN (CLINIMIX) +/- additives 80 mL/hr at 10/14/11 1733   And  . fat emulsion 250 mL (10/14/11 1733)  . TPN (CLINIMIX) +/- additives     And  . fat emulsion    . heparin 1,250 Units/hr (10/15/11 0400)  . vasopressin (PITRESSIN) infusion - *FOR SHOCK*     PRN Meds:.acetaminophen, acetaminophen, DOBUTamine, norepinephrine (LEVOPHED) Adult infusion, ondansetron (ZOFRAN) IV, ondansetron, sodium chloride, vasopressin (PITRESSIN) infusion - *FOR SHOCK*   Objective: Weight change: 14.1 oz (0.4 kg)  Intake/Output Summary (Last 24 hours) at 10/15/11 0956 Last data filed at 10/15/11 0900  Gross per 24 hour  Intake 3393.4 ml  Output   1025 ml  Net 2368.4 ml   Blood pressure 146/79, pulse 101, temperature 99.2 F (37.3 C),  temperature source Oral, resp. rate 27, height 5\' 5"  (1.651 m), weight 180 lb 5.4 oz (81.8 kg), SpO2 97.00%. Temp:  [98.6 F (37 C)-100.7 F (38.2 C)] 99.2 F (37.3 C) (04/18 0812) Pulse Rate:  [73-121] 101  (04/18 0900) Resp:  [21-31] 27  (04/18 0800) BP: (82-170)/(18-100) 146/79 mmHg (04/18 0800) SpO2:  [92 %-100 %] 97 % (04/18 0900) Weight:  [180 lb 5.4 oz (81.8 kg)] 180 lb 5.4 oz (81.8 kg) (04/18 0500)  Physical Exam: General: Alert and awake, Chronically ill appearing, fatigued oriented having foley changed   Lab Results:  Basename 10/15/11 0500 10/14/11 1030  WBC 7.8 8.8  HGB 8.5* 8.5*  HCT 24.9* 25.3*  PLT 167 154    BMET  Basename 10/15/11 0500 10/14/11 0500  NA 129* 134*  K 2.4* 3.1*  CL 98 106  CO2 22 19  GLUCOSE 170* 112*  BUN 10 15  CREATININE 0.50 0.64  CALCIUM 8.2* 7.8*    Micro Results: Recent Results (from the past 240 hour(s))  URINE CULTURE     Status: Normal   Collection Time   10/12/11  7:52 PM      Component Value Range Status Comment   Specimen Description URINE, CATHETERIZED   Final    Special Requests ADDED 10/12/11 2017   Final    Culture  Setup Time [id number]   Final    Colony Count >=100,000 COLONIES/ML   Final    Culture ESCHERICHIA COLI   Final    Report Status 10/15/2011 FINAL   Final    Organism ID, Bacteria ESCHERICHIA COLI   Final   CULTURE, BLOOD (ROUTINE X 2)     Status: Normal (Preliminary result)   Collection Time   10/12/11  8:00 PM      Component Value Range Status Comment   Specimen Description BLOOD HAND LEFT   Final    Special Requests     Final    Value: BOTTLES DRAWN AEROBIC AND ANAEROBIC AERO 10CC, ANAE 5CC   Culture  Setup Time [id number]   Final    Culture     Final    Value: GRAM POSITIVE RODS     GRAM POSITIVE COCCI IN CHAINS     Note: Gram Stain  Report Called to,Read Back By and Verified With: CHRISTY FRALEY @1132  10/13/11 BY KRAWS   Report Status PENDING   Incomplete   CULTURE, BLOOD (ROUTINE X 2)      Status: Normal (Preliminary result)   Collection Time   10/12/11  8:13 PM      Component Value Range Status Comment   Specimen Description BLOOD HAND RIGHT   Final    Special Requests BOTTLES DRAWN AEROBIC ONLY 10CC   Final    Culture  Setup Time [id number]   Final    Culture     Final    Value: GRAM POSITIVE COCCI IN CLUSTERS     Note: Gram Stain Report Called to,Read Back By and Verified With: LAUREN CONNALLY @ 1828 ON 10/13/11 BY GOLLD   Report Status PENDING   Incomplete   CATH TIP CULTURE     Status: Normal (Preliminary result)   Collection Time   10/13/11  3:47 AM      Component Value Range Status Comment   Specimen Description CATH TIP   Final    Special Requests NONE   Final    Culture     Final    Value: >100 COLONIES STAPHYLOCOCCUS SPECIES (COAGULASE NEGATIVE)     Note: RIFAMPIN AND GENTAMICIN SHOULD NOT BE USED AS SINGLE DRUGS FOR TREATMENT OF STAPH INFECTIONS.   Report Status PENDING   Incomplete   MRSA PCR SCREENING     Status: Normal   Collection Time   10/13/11  3:51 AM      Component Value Range Status Comment   MRSA by PCR NEGATIVE  NEGATIVE  Final   MRSA PCR SCREENING     Status: Normal   Collection Time   10/13/11  3:35 PM      Component Value Range Status Comment   MRSA by PCR NEGATIVE  NEGATIVE  Final   CULTURE, BLOOD (ROUTINE X 2)     Status: Normal (Preliminary result)   Collection Time   10/13/11  5:08 PM      Component Value Range Status Comment   Specimen Description BLOOD RIGHT HAND   Final    Special Requests BOTTLES DRAWN AEROBIC ONLY 5CC   Final    Culture  Setup Time [id number]   Final    Culture     Final    Value: GRAM POSITIVE COCCI IN CLUSTERS     Note: Gram Stain Report Called to,Read Back By and Verified With: ASHLEY HARRISON @0627  ON 10/15/11 BY MCLET   Report Status PENDING   Incomplete   URINE CULTURE     Status: Normal   Collection Time   10/13/11  5:12 PM      Component Value Range Status Comment   Specimen Description URINE,  CATHETERIZED   Final    Special Requests NONE   Final    Culture  Setup Time [id number]   Final    Colony Count NO GROWTH   Final    Culture NO GROWTH   Final    Report Status 10/14/2011 FINAL   Final     Studies/Results: Ct Abdomen Pelvis W Contrast  10/13/2011  *RADIOLOGY REPORT*  Clinical Data: 78 year old with sepsis.  CT ABDOMEN AND PELVIS WITH CONTRAST  Technique:  Multidetector CT imaging of the abdomen and pelvis was performed following the standard protocol during bolus administration of intravenous contrast.  Contrast: 80mL OMNIPAQUE IOHEXOL 300 MG/ML  SOLN  Comparison: 07/29/2011  Findings: There are tiny bilateral pleural effusions with basilar  atelectasis.  A small amount of focal atelectasis or round atelectasis at the right lung base on sequence 3, image 7.  No evidence for free air.  Coronary artery calcifications.  The gallbladder has been removed. Again noted is low density fluid or postsurgical changes between the stomach and left hepatic lobe, best seen on sequence 2, image 23.  This area roughly measures 2.1 cm and similar to the prior examination.  There is residual fluid or edema in the gallbladder fossa and along the inferior liver margin.  No evidence for a drainable fluid collection.  Gastrostomy tube is present.  Again noted is a large calcification near the pancreatic head and neck. There is oral contrast within the stomach and large bowel.  No gross abnormality to the liver, portal venous system, adrenal glands, kidneys or spleen.  Again noted are low density structures within the kidneys which are too small to definitively characterize but could represent cysts.  Calcifications associated with the uterus and cannot exclude fibroids.  No significant free fluid in the pelvis.  There is a right hip replacement.  No significant lymphadenopathy. A Foley catheter within the urinary bladder.  IMPRESSION:  Postsurgical changes in the upper abdomen.  Residual low density fluid or  edema around the liver has minimally changed since 07/29/2011.  Tiny pleural effusions with bibasilar atelectasis.  Original Report Authenticated By: Richarda Overlie, M.D.   Dg Chest Port 1 View  10/13/2011  *RADIOLOGY REPORT*  Clinical Data: Line placement.  PORTABLE CHEST - 1 VIEW  Comparison: 10/12/2011  Findings: Cardiomegaly with vascular congestion.  Diffuse interstitial prominence may reflect early interstitial edema.  No confluent opacities or effusions.  No acute bony abnormality.  Left central line is in place.  The tip is in the upper right atrium.  No pneumothorax.  IMPRESSION: Cardiomegaly, vascular congestion.  Question early interstitial edema.  Original Report Authenticated By: Cyndie Chime, M.D.   Dg Abd Portable 2v  10/13/2011  *RADIOLOGY REPORT*  Clinical Data: Abdominal pain.  PORTABLE ABDOMEN - 2 VIEW  Comparison: CT 10/13/2011  Findings: Oral contrast material seen within the colon.  There is a nonobstructive bowel gas pattern.  No free air on the decubitus views.  No organomegaly.  IMPRESSION: No obstruction or free air.  Original Report Authenticated By: Cyndie Chime, M.D.      Assessment/Plan: [name] is a 78 y.o. female admtited withseptic physiology and confusing micro findings with DVT;s multiple right UE from PICC line  1) Sepsis: Still remains a bit of a  Confusing picture. She has GPCpairs and GPR in one culture from blood and GPCC in the other and  E coli in urine, Cath tip culture with CNS. Cultures are not all lining up. Still suspect the PICC line as source but will not ignore e coli >100k in her urine given her abdominal pain and septic picture and confusing micro data  --continue vancomycin, flagyl ceftazidime     LOS: 3 days   Acey Lav 10/15/2011, 9:56 AM

## 2011-10-15 NOTE — Progress Notes (Signed)
Name: [name] MRN: [id number] DOB: 1932/05/28 LOS: 3  PCCM PROGRESS NOTE  Brief Hx: 78 year old female with PMH perforated duodenal ulcer, status post antrectomy, duodenal resection, Billroth 2 reconstruction, omental patching of Duodenal bulb, open cholecystectomy, gastrostomy and jejunostomy on 05/07/11. Her course was complicated by epigastric abscess requiring percutaneous drain per IR 05/31/11. At discharge she was noted to have malnutrition and short-gut syndrome requiring chronic TNA. Also most recently treated for C diff 07/2011. Brought to the ER on 4/15 w/ fever and purulent discharge from PICC insertion site, abd pain and UA concerning for UTI. On presentation was hypotensive and confused. Her BP Initially responded to IVFs. She was started on antibiotics, PICC line was removed, and she was admitted to SDU. Later in the afternoon on 4/16 she again became hypotensive w/ SBP in 40s-60s (had already gotten 5 liters in ER of IVFs). PCCM was asked to see.   Subjective:  Pt is awake and answering questions this AM, sounds confused. Has history of dementia. HCPOA was called and is going through cancer treatment with her daughter and cannot come.  Denying pain at this time. Having large volumes of incontinent urine voids. No nausea/vomiting/diarrhea. States she slept well.   Vital Signs: Filed Vitals:   10/15/11 0630  BP:   Pulse: 99  Temp:   Resp: 26   I/O last 3 completed shifts: In: 5441.3 [I.V.:2324.7; Blood:351; IV Piggyback:1670] Out: 2565 [Urine:1865; Drains:700]  Physical Examination: General:  Awake and oriented times 1-2, knows name and place Neuro:  Moves all four extremities, no focal deficits, dementia HEENT:  EOM intact, PERRLA Neck:  Supple, no JVD Cardiovascular:  S1 S2 heard Lungs:  Good air flow bilaterally, no coarse sounds Abdomen: PEG in place, set to suction, multiple scars, some pus around midline scar, no erythema Musculoskeletal:  No focal  weakness Skin:  No breakdown noted  Labs: Basic Metabolic Panel:  Basename 10/15/11 0500 10/14/11 1100 10/14/11 0500  NA 129* -- 134*  K 2.4* -- 3.1*  CL 98 -- 106  CO2 22 -- 19  GLUCOSE 170* -- 112*  BUN 10 -- 15  CREATININE 0.50 -- 0.64  CALCIUM 8.2* -- 7.8*  MG 1.3* 1.5 --  PHOS 3.2 2.8 --   Liver Function Tests:  Basename 10/15/11 0500 10/14/11 0500  AST 20 46*  ALT 40* 57*  ALKPHOS 119* 113  BILITOT 0.4 0.3  PROT 5.7* 5.2*  ALBUMIN 1.9* 1.8*    Basename 10/13/11 1630  LIPASE 18  AMYLASE --   CBC:  Basename 10/15/11 0500 10/14/11 1030 10/13/11 0530  WBC 7.8 8.8 --  NEUTROABS 6.2 -- 10.5*  HGB 8.5* 8.5* --  HCT 24.9* 25.3* --  MCV 77.8* 80.1 --  PLT 167 154 --   CBG:  Basename 10/15/11 0416 10/14/11 2002 10/14/11 1602 10/14/11 1206 10/14/11 0811 10/13/11 1643  GLUCAP 163* 124* 105* 114* 109* 95   Coagulation:  Basename 10/15/11 0215 10/13/11 1630  LABPROT 18.1* 19.2*  INR 1.47 1.58*   Lines, Tubes, etc: PICC - removed 4/16 CVC - 4/33 PEG - 31 days old  Microbiology: Blood cultures: GPC clusters>>> Cath tip>>>coag neg staph Urine:  ecoli  Antibiotics:  Vancomycin Flagyl>>> 4/18 Ceftazidime  Studies/Events: 4/16 tx to ICU for worsening shock CT abd/pelvis 4/16: Postsurgical changes in the upper abdomen. Residual low density  fluid or edema around the liver has minimally changed since 07/29/2011.  Tiny pleural effusions with bibasilar atelectasis  RUE [place] 4/17>>>DVT noted in  the right subclavian vein, axillary vein and brachial vein. Radial and ulnar veins patent. Cephalic and basilic veins patent.  Consults:  ID  Assessment and Plan: Pulmonary:  A: Some vascular congestion  P: consider lasix in am was pos 2 liters, correct lytes, KCO  Cardiac:  A: No acute AMI - CE taken on admission negative and no significant heart history.  P: Continue to monitor, tele.  Neurologic:  A: Delirium/Acute Encephalopathy - A and O times 1-2, will  continue to monitor with fluids  Dementia - at baseline, no family to talk to today. Will watch. P: Continue to monitor, avoid benzo PT consult  GI:  A: S/P multiple abdominal surgeries - PEG to suction, draining bilious fluid, non-bloody. One wound has some purulent material on it, does not appear to be open. No signs of obstruction and + BS today. Denies abdominal pain currently. 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 Dif - Treated in Feb, no active diarrhea. P: PEG to suction, nutrition for TNA, folow output  [facility]  A: Shock - Likely due to bacteremia is on levophed and vasopressin.  Acute Anemia - Likely anemia of critical illness. One unit of PRBC ordered this AM for Hg 7. No signs active bleeding. ? Clot in R UE - US showed no abscess but clot. Started heparin, may need vascular consult. P: no signs of active bleeding. Will monitor closely..  hgb goal to less than 7  Renal:  A: Hypokalemia - 2.3 today, will replete. Hypomagnesemia - 1.3, will replete.  AKI - Resolved, Cr 0.5 today. Hyponatremia - Likely due to volume loss from GI tract/short gut syndrome, off pressors P: bmet in pm and am , repplace mg, k aggressive, kvo  Endocrine:  A: DM II - CBG check with SSI as needed. No thyroid disease.  Metabolic acidosis - AG on admission, none currently (AG 9 this AM). From lactate levels which have resolved.  P: CBG monitoring with SSI as needed.  Cortisol 47 , dc steroids  Infectious Disease:  A: Bacteremia - blood cultures Gram + cocci clusters and GPR. Some likely contaminant but covered. On Vanc/flagyl/ceftaz. ID seeing, await urine and catheter tip cultures. Repeat blood cultures pending. C. Dif - Treated in Feb, no active diarrhea this admission. Will watch closely. At risk septic thrombophlebitis P:  Vanc/flagyl/Ceftaz was ono, cath tip not a pathogen gpc likely a pathogen continue vanc Dc flagyl Echo? Needed picc out Concern  septic thrombophlebitis, hep drip of concern vasc consult Treating e coli Ct neg  Best Practice: DVT heparin SUP: protonix Nutrition: NPO c chips, suction from PEG, tna Glycemic control: CBG monitoring SSI as needed Sedation/analgesia: tylenol  Genella Mech 10/15/2011, 7:18 AM [phone] PGY-1  I have fully examined this patient and agree with above findings.    And edited.  Mcarthur Rossetti. Tyson Alias, MD, FACP Pgr: [phone] [facility]

## 2011-10-15 NOTE — Progress Notes (Signed)
ANTICOAGULATION CONSULT NOTE - Follow-Up Consult  Pharmacy Consult for heparin Indication: DVT  Allergies  Allergen Reactions  . Other Swelling    Pinto beans cause swelling and hives  . Penicillins Swelling    Patient Measurements: Height: 5\' 5"  (165.1 cm) Weight: 180 lb 5.4 oz (81.8 kg) IBW/kg (Calculated) : 57  Heparin Dosing Weight: 74.9kg  Vital Signs: Temp: 99 F (37.2 C) (04/18 1204) Temp src: Oral (04/18 1204) BP: 147/65 mmHg (04/18 1200) Pulse Rate: 93  (04/18 1200)  Labs:  Basename 10/15/11 1244 10/15/11 0500 10/15/11 0215 10/14/11 1030 10/14/11 0500 10/13/11 1630  HGB -- 8.5* -- 8.5* -- --  HCT -- 24.9* -- 25.3* 21.0* --  PLT -- 167 -- 154 153 --  APTT -- -- -- -- -- 53*  LABPROT -- -- 18.1* -- -- 19.2*  INR -- -- 1.47 -- -- 1.58*  HEPARINUNFRC 0.13* -- 0.23* -- -- --  CREATININE -- 0.50 -- -- 0.64 0.75  CKTOTAL -- -- -- -- -- 213*  CKMB -- -- -- -- -- 2.4  TROPONINI -- -- -- -- -- <0.30   Estimated Creatinine Clearance: 61.2 ml/min (by C-G formula based on Cr of 0.5).  Medical History: Past Medical History  Diagnosis Date  . Hypertension   . Reflux   . Right elbow pain     OTIF  . Dementia   . Depression   . Osteoarthritis   . Pancreatitis 11/2007    HOP  . Angiomyolipoma of kidney     right  . Ulcerative esophagitis 12/05/2007    hx elevated gastrin, severe on EGD by Dr Jena Gauss , h pylori negative  . Hiatal hernia   . S/P colonoscopy 2009    pt reports normal by Dr Lovell Sheehan  . Diabetes mellitus   . Anemia   . Hyponatremia   . Cellulitis   . Esophagitis   . Peptic ulcer   . Insomnia     Medications:  Scheduled:     . cefTAZidime (FORTAZ)  IV  1 g Intravenous Q8H  . insulin aspart  0-9 Units Subcutaneous Q4H  . magnesium sulfate LVP 250-500 ml  6 g Intravenous Once  . metronidazole  500 mg Intravenous Q8H  . pantoprazole (PROTONIX) IV  40 mg Intravenous Q24H  . potassium chloride  10 mEq Intravenous Q1 Hr x 5  . potassium chloride   10 mEq Intravenous Q1 Hr x 6  . potassium chloride  10 mEq Intravenous Q1 Hr x 2  . potassium chloride      . vancomycin  1,000 mg Intravenous Q8H  . DISCONTD: heparin subcutaneous  5,000 Units Subcutaneous Q8H  . DISCONTD: magnesium sulfate 1 - 4 g bolus IVPB  2 g Intravenous Once  . DISCONTD: potassium chloride  10 mEq Intravenous Q1 Hr x 6  . DISCONTD: vancomycin  1,250 mg Intravenous Q12H   Infusions:     . sodium chloride 20 mL/hr at 10/14/11 1113  . sodium chloride 20 mL/hr at 10/14/11 1100  . TPN (CLINIMIX) +/- additives 80 mL/hr at 10/14/11 1733   And  . fat emulsion 250 mL (10/14/11 1733)  . TPN (CLINIMIX) +/- additives     And  . fat emulsion    . heparin 1,250 Units/hr (10/15/11 1300)  . vasopressin (PITRESSIN) infusion - *FOR SHOCK*      Assessment: 78 yo female with new upper extremity DVT on doppler. H/H 8.5/24.9; Plt 167; INR on 04/16 1.58 (not on coumadin PTA).  no coumadin now because may have intervention in the acute setting. Heparin level subtherapeutic today @ 0.13. No bleeding reported, Hgb increased after transfusion  Goal of Therapy:  Heparin level 0.3-0.7 units/ml   Plan:  1) Increase heparin to 1550 units/hr (=15.5 ml/hr). No bolus. 2) Check an 8 hour heparin level 3) Daily heparin level and CBC   Janace Litten, PharmD [phone] 10/15/2011,1:40 PM

## 2011-10-15 NOTE — Progress Notes (Signed)
ANTICOAGULATION CONSULT NOTE - Follow Up Consult  Pharmacy Consult for heparin Indication: DVT  Labs:  Basename 10/15/11 0215 10/14/11 1030 10/14/11 0500 10/13/11 1630 10/13/11 0530  HGB -- 8.5* 7.0* -- --  HCT -- 25.3* 21.0* 24.8* --  PLT -- 154 153 172 --  APTT -- -- -- 53* --  LABPROT 18.1* -- -- 19.2* --  INR 1.47 -- -- 1.58* --  HEPARINUNFRC 0.23* -- -- -- --  CREATININE -- -- 0.64 0.75 1.02  CKTOTAL -- -- -- 213* --  CKMB -- -- -- 2.4 --  TROPONINI -- -- -- <0.30 --    Assessment: 78yo female subtherapeutic on heparin with initial dosing for DVT.  Goal of Therapy:  Heparin level 0.3-0.7 units/ml   Plan:  Will increase heparin gtt by 2 units/kg/hr to 1250 units/hr and check level in 8hr.  Colleen Can PharmD BCPS 10/15/2011,3:42 AM

## 2011-10-15 NOTE — Progress Notes (Signed)
ANTIBIOTIC CONSULT NOTE - FOLLOW UP  Pharmacy Consult for vancomycin Indication: rule out sepsis  Labs:  Basename 10/15/11 0500 10/14/11 1030 10/14/11 0500 10/13/11 1630  WBC 7.8 8.8 8.4 --  HGB 8.5* 8.5* 7.0* --  PLT 167 154 153 --  LABCREA -- -- -- --  CREATININE 0.50 -- 0.64 0.75   Estimated Creatinine Clearance: 61.2 ml/min (by C-G formula based on Cr of 0.5).  Basename 10/15/11 0500  VANCOTROUGH 11.7  VANCOPEAK --  VANCORANDOM --  GENTTROUGH --  GENTPEAK --  GENTRANDOM --  TOBRATROUGH --  TOBRAPEAK --  TOBRARND --  AMIKACINPEAK --  AMIKACINTROU --  AMIKACIN --     Microbiology: Recent Results (from the past 720 hour(s))  URINE CULTURE     Status: Normal   Collection Time   10/12/11  7:52 PM      Component Value Range Status Comment   Specimen Description URINE, CATHETERIZED   Final    Special Requests ADDED 10/12/11 2017   Final    Culture  Setup Time [id number]   Final    Colony Count >=100,000 COLONIES/ML   Final    Culture ESCHERICHIA COLI   Final    Report Status 10/15/2011 FINAL   Final    Organism ID, Bacteria ESCHERICHIA COLI   Final   CULTURE, BLOOD (ROUTINE X 2)     Status: Normal (Preliminary result)   Collection Time   10/12/11  8:00 PM      Component Value Range Status Comment   Specimen Description BLOOD HAND LEFT   Final    Special Requests     Final    Value: BOTTLES DRAWN AEROBIC AND ANAEROBIC AERO 10CC, ANAE 5CC   Culture  Setup Time [id number]   Final    Culture     Final    Value: GRAM POSITIVE RODS     GRAM POSITIVE COCCI IN CHAINS     Note: Gram Stain Report Called to,Read Back By and Verified With: CHRISTY FRALEY @1132  10/13/11 BY KRAWS   Report Status PENDING   Incomplete   CULTURE, BLOOD (ROUTINE X 2)     Status: Normal (Preliminary result)   Collection Time   10/12/11  8:13 PM      Component Value Range Status Comment   Specimen Description BLOOD HAND RIGHT   Final    Special Requests BOTTLES DRAWN AEROBIC ONLY 10CC   Final      Culture  Setup Time [id number]   Final    Culture     Final    Value: GRAM POSITIVE COCCI IN CLUSTERS     Note: Gram Stain Report Called to,Read Back By and Verified With: LAUREN CONNALLY @ 1828 ON 10/13/11 BY GOLLD   Report Status PENDING   Incomplete   CATH TIP CULTURE     Status: Normal (Preliminary result)   Collection Time   10/13/11  3:47 AM      Component Value Range Status Comment   Specimen Description CATH TIP   Final    Special Requests NONE   Final    Culture Culture reincubated for better growth   Final    Report Status PENDING   Incomplete   MRSA PCR SCREENING     Status: Normal   Collection Time   10/13/11  3:51 AM      Component Value Range Status Comment   MRSA by PCR NEGATIVE  NEGATIVE  Final   MRSA PCR SCREENING     Status: Normal  Collection Time   10/13/11  3:35 PM      Component Value Range Status Comment   MRSA by PCR NEGATIVE  NEGATIVE  Final   CULTURE, BLOOD (ROUTINE X 2)     Status: Normal (Preliminary result)   Collection Time   10/13/11  5:08 PM      Component Value Range Status Comment   Specimen Description BLOOD RIGHT HAND   Final    Special Requests BOTTLES DRAWN AEROBIC ONLY 5CC   Final    Culture  Setup Time [id number]   Final    Culture     Final    Value: GRAM POSITIVE COCCI IN CLUSTERS     Note: Gram Stain Report Called to,Read Back By and Verified With: ASHLEY HARRISON @0627  ON 10/15/11 BY MCLET   Report Status PENDING   Incomplete   URINE CULTURE     Status: Normal   Collection Time   10/13/11  5:12 PM      Component Value Range Status Comment   Specimen Description URINE, CATHETERIZED   Final    Special Requests NONE   Final    Culture  Setup Time [id number]   Final    Colony Count NO GROWTH   Final    Culture NO GROWTH   Final    Report Status 10/14/2011 FINAL   Final     Anti-infectives     Start     Dose/Rate Route Frequency Ordered Stop   10/15/11 1400   vancomycin (VANCOCIN) IVPB 1000 mg/200 mL premix        1,000  mg 200 mL/hr over 60 Minutes Intravenous Every 8 hours 10/15/11 0634     10/13/11 2200   aztreonam (AZACTAM) 1 g in dextrose 5 % 50 mL IVPB  Status:  Discontinued        1 g 100 mL/hr over 30 Minutes Intravenous 3 times per day 10/13/11 1619 10/13/11 1639   10/13/11 1800   cefTAZidime (FORTAZ) 1 g in dextrose 5 % 50 mL IVPB        1 g 100 mL/hr over 30 Minutes Intravenous Every 8 hours 10/13/11 1640     10/13/11 1615   aztreonam (AZACTAM) 2 g in dextrose 5 % 50 mL IVPB  Status:  Discontinued        2 g 100 mL/hr over 30 Minutes Intravenous  Once 10/13/11 1611 10/13/11 1612   10/13/11 1615   vancomycin (VANCOCIN) IVPB 1000 mg/200 mL premix  Status:  Discontinued        1,000 mg 200 mL/hr over 60 Minutes Intravenous  Once 10/13/11 1611 10/13/11 1612   10/13/11 1615   metroNIDAZOLE (FLAGYL) IVPB 500 mg  Status:  Discontinued        500 mg 100 mL/hr over 60 Minutes Intravenous  Once 10/13/11 1611 10/13/11 1612   10/13/11 1430   Ampicillin-Sulbactam (UNASYN) 3 g in sodium chloride 0.9 % 100 mL IVPB  Status:  Discontinued        3 g 100 mL/hr over 60 Minutes Intravenous STAT 10/13/11 1417 10/13/11 1444   10/13/11 0600   vancomycin (VANCOCIN) 1,250 mg in sodium chloride 0.9 % 250 mL IVPB  Status:  Discontinued        1,250 mg 166.7 mL/hr over 90 Minutes Intravenous Every 12 hours 10/13/11 0457 10/15/11 0632   10/13/11 0600   aztreonam (AZACTAM) 1 g in dextrose 5 % 50 mL IVPB  Status:  Discontinued  1 g 100 mL/hr over 30 Minutes Intravenous 3 times per day 10/13/11 0507 10/13/11 1526   10/13/11 0400   metroNIDAZOLE (FLAGYL) IVPB 500 mg        500 mg 100 mL/hr over 60 Minutes Intravenous Every 8 hours 10/13/11 0323     10/12/11 1915   vancomycin (VANCOCIN) IVPB 1000 mg/200 mL premix        1,000 mg 200 mL/hr over 60 Minutes Intravenous  Once 10/12/11 1901 10/12/11 2227   10/12/11 1915   ceFEPIme (MAXIPIME) 2 g in dextrose 5 % 50 mL IVPB        2 g 100 mL/hr over 30 Minutes  Intravenous To [facility] 10/12/11 1901 10/12/11 2105          Assessment: 78yo female subtherapeutic on vancomycin with initial dosing for sepsis.  Noted that blood Gram stain shows GPC in clusters.  Goal of Therapy:  Vancomycin trough level 15-20 mcg/ml  Plan:  Will increase dosing of vanc to 1000mg  IV Q8H for calculated trough of ~16 and continue to monitor.  Colleen Can PharmD BCPS 10/15/2011,6:34 AM

## 2011-10-16 ENCOUNTER — Encounter (HOSPITAL_COMMUNITY): Payer: Self-pay | Admitting: Radiology

## 2011-10-16 ENCOUNTER — Inpatient Hospital Stay (HOSPITAL_COMMUNITY): Payer: Medicare HMO

## 2011-10-16 DIAGNOSIS — I339 Acute and subacute endocarditis, unspecified: Secondary | ICD-10-CM

## 2011-10-16 DIAGNOSIS — T827XXA Infection and inflammatory reaction due to other cardiac and vascular devices, implants and grafts, initial encounter: Secondary | ICD-10-CM

## 2011-10-16 DIAGNOSIS — R6521 Severe sepsis with septic shock: Secondary | ICD-10-CM

## 2011-10-16 DIAGNOSIS — A419 Sepsis, unspecified organism: Secondary | ICD-10-CM

## 2011-10-16 LAB — CULTURE, BLOOD (ROUTINE X 2): Culture  Setup Time: 201304170133

## 2011-10-16 LAB — CBC
HCT: 24.8 % — ABNORMAL LOW (ref 36.0–46.0)
Platelets: 182 10*3/uL (ref 150–400)
RDW: 17.4 % — ABNORMAL HIGH (ref 11.5–15.5)
WBC: 6.1 10*3/uL (ref 4.0–10.5)

## 2011-10-16 LAB — BASIC METABOLIC PANEL
BUN: 11 mg/dL (ref 6–23)
Chloride: 97 mEq/L (ref 96–112)
GFR calc Af Amer: >90 mL/min (ref >90)
GFR calc non Af Amer: 87 mL/min — ABNORMAL LOW (ref >90)
Potassium: 2.8 mEq/L — ABNORMAL LOW (ref 3.5–5.1)
Sodium: 130 mEq/L — ABNORMAL LOW (ref 135–145)

## 2011-10-16 LAB — CATH TIP CULTURE: Culture: >100

## 2011-10-16 LAB — GLUCOSE, CAPILLARY
Glucose-Capillary: 154 mg/dL — ABNORMAL HIGH (ref 70–99)
Glucose-Capillary: 162 mg/dL — ABNORMAL HIGH (ref 70–99)
Glucose-Capillary: 84 mg/dL (ref 70–99)

## 2011-10-16 LAB — MAGNESIUM: Magnesium: 2 mg/dL (ref 1.5–2.5)

## 2011-10-16 LAB — HEPARIN LEVEL (UNFRACTIONATED): Heparin Unfractionated: 0.33 IU/mL (ref 0.30–0.70)

## 2011-10-16 MED ORDER — FAT EMULSION 20 % IV EMUL
250.0000 mL | INTRAVENOUS | Status: DC
  Filled 2011-10-16: qty 250

## 2011-10-16 MED ORDER — POTASSIUM CHLORIDE 10 MEQ/100ML IV SOLN
10.0000 meq | INTRAVENOUS | Status: AC
  Administered 2011-10-16 (×6): 10 meq via INTRAVENOUS
  Filled 2011-10-16 (×6): qty 100

## 2011-10-16 MED ORDER — GENTAMICIN SULFATE 40 MG/ML IJ SOLN
70.0000 mg | Freq: Three times a day (TID) | INTRAVENOUS | Status: DC
  Administered 2011-10-16 – 2011-10-17 (×2): 70 mg via INTRAVENOUS
  Filled 2011-10-16 (×6): qty 1.75

## 2011-10-16 MED ORDER — TRACE MINERALS CR-CU-MN-SE-ZN 10-1000-500-60 MCG/ML IV SOLN
INTRAVENOUS | Status: DC
  Filled 2011-10-16: qty 2000

## 2011-10-16 MED ORDER — ONDANSETRON HCL 4 MG/2ML IJ SOLN
4.0000 mg | INTRAMUSCULAR | Status: DC | PRN
  Administered 2011-10-23: 4 mg via INTRAVENOUS
  Filled 2011-10-16: qty 2

## 2011-10-16 MED ORDER — FENTANYL CITRATE 0.05 MG/ML IJ SOLN
12.5000 ug | INTRAMUSCULAR | Status: DC | PRN
  Administered 2011-10-16 – 2011-10-18 (×13): 25 ug via INTRAVENOUS
  Filled 2011-10-16 (×14): qty 2

## 2011-10-16 MED ORDER — IOHEXOL 300 MG/ML  SOLN
80.0000 mL | Freq: Once | INTRAMUSCULAR | Status: AC | PRN
  Administered 2011-10-16: 80 mL via INTRAVENOUS

## 2011-10-16 MED ORDER — POTASSIUM CHLORIDE 10 MEQ/100ML IV SOLN
10.0000 meq | INTRAVENOUS | Status: AC
  Administered 2011-10-16 (×6): 10 meq via INTRAVENOUS
  Filled 2011-10-16: qty 600

## 2011-10-16 NOTE — Progress Notes (Addendum)
Lab called reporting gram +ve cocci in clusters grown in the aerobic bottle from the 16th of this month.Notified Dr Darrick Penna in [place].

## 2011-10-16 NOTE — Progress Notes (Signed)
PARENTERAL NUTRITION CONSULT NOTE - FOLLOW UP  Pharmacy Consult for TPN Indication: chronic TPN at home for short - gut syndrome  Allergies  Allergen Reactions  . Other Swelling    Pinto beans cause swelling and hives  . Penicillins Swelling    Patient Measurements: Height: 5\' 5"  (165.1 cm) Weight: 179 lb 10.8 oz (81.5 kg) IBW/kg (Calculated) : 57  Adjusted Body Weight:  Usual Weight: 162 lbs  Vital Signs: Temp: 100 F (37.8 C) (04/19 0405) Temp src: Oral (04/19 0405) BP: 148/68 mmHg (04/19 0800) Pulse Rate: 97  (04/19 0800) Intake/Output from previous day: 04/18 0701 - 04/19 0700 In: 4856.7 [I.V.:884.7; IV Piggyback:1860; TPN:2112] Out: 4145 [Urine:4145] Intake/Output from this shift: Total I/O In: 316.5 [I.V.:28.5; IV Piggyback:200; TPN:88] Out: 100 [Urine:100]  Labs:  [facility] 10/16/11 0450 10/15/11 0500 10/15/11 0215 10/14/11 1030 10/13/11 1630  WBC 6.1 7.8 -- 8.8 --  HGB 8.3* 8.5* -- 8.5* --  HCT 24.8* 24.9* -- 25.3* --  PLT 182 167 -- 154 --  APTT -- -- -- -- 53*  INR -- -- 1.47 -- 1.58*     Basename 10/16/11 0450 10/15/11 2055 10/15/11 0500 10/14/11 1100 10/14/11 0500 10/13/11 1630  NA 130* 129* 129* -- -- --  K 2.8* 2.8* 2.4* -- -- --  CL 97 96 98 -- -- --  CO2 24 23 22  -- -- --  GLUCOSE 122* 159* 170* -- -- --  BUN 11 9 10  -- -- --  CREATININE 0.56 0.53 0.50 -- -- --  LABCREA -- -- -- -- -- --  CREAT24HRUR -- -- -- -- -- --  CALCIUM 8.0* 8.4 8.2* -- -- --  MG 2.0 2.6* 1.3* -- -- --  PHOS -- 3.1 3.2 2.8 -- --  PROT -- -- 5.7* -- 5.2* 6.1  ALBUMIN -- -- 1.9* -- 1.8* 2.1*  AST -- -- 20 -- 46* 73*  ALT -- -- 40* -- 57* 76*  ALKPHOS -- -- 119* -- 113 145*  BILITOT -- -- 0.4 -- 0.3 0.4  BILIDIR -- -- -- -- -- --  IBILI -- -- -- -- -- --  PREALBUMIN -- -- 5.0* -- -- --  TRIG -- -- 89 -- -- --  CHOLHDL -- -- -- -- -- --  CHOL -- -- 79 -- -- --   Estimated Creatinine Clearance: 61.1 ml/min (by C-G formula based on Cr of 0.56).    Basename  10/16/11 0357 10/16/11 0019 10/15/11 2044  GLUCAP 125* 154* 138*    Medical History: Past Medical History  Diagnosis Date  . Hypertension   . Reflux   . Right elbow pain     OTIF  . Dementia   . Depression   . Osteoarthritis   . Pancreatitis 11/2007    HOP  . Angiomyolipoma of kidney     right  . Ulcerative esophagitis 12/05/2007    hx elevated gastrin, severe on EGD by Dr Jena Gauss , h pylori negative  . Hiatal hernia   . S/P colonoscopy 2009    pt reports normal by Dr Lovell Sheehan  . Diabetes mellitus   . Anemia   . Hyponatremia   . Cellulitis   . Esophagitis   . Peptic ulcer   . Insomnia     Medications:  Prescriptions prior to admission  Medication Sig Dispense Refill  . acetaminophen (TYLENOL) 500 MG tablet Take 500 mg by mouth daily as needed. For pain      . amLODipine (NORVASC) 5 MG tablet  Take 5 mg by mouth daily.      . benazepril (LOTENSIN) 20 MG tablet Take 20 mg by mouth daily.       . butalbital-acetaminophen-caffeine (FIORICET, ESGIC) 50-325-40 MG per tablet Take 2 tablets by mouth every 8 (eight) hours as needed. For headache      . calcium-vitamin D (OSCAL WITH D) 500-200 MG-UNIT per tablet Take 1 tablet by mouth daily.       . cloNIDine (CATAPRES - DOSED IN MG/24 HR) 0.1 mg/24hr patch Place 1 patch onto the skin once a week. Change on Mondays      . darifenacin (ENABLEX) 15 MG 24 hr tablet Take 15 mg by mouth daily.       . ergocalciferol (VITAMIN D2) 50000 UNITS capsule Take 50,000 Units by mouth once a week.      . insulin aspart (NOVOLOG) 100 UNIT/ML injection Inject 3 Units into the skin 3 (three) times daily before meals. Sliding scale-give 3 units sq for CBG >150      . insulin regular (NOVOLIN R,HUMULIN R) 100 units/mL injection Inject 65 Units into the skin daily.      . Loperamide HCl (IMODIUM PO) Take 2 mg by mouth every 4 (four) hours as needed. For diarrhea      . metFORMIN (GLUCOPHAGE) 1000 MG tablet Take 1,000 mg by mouth daily with breakfast.         . metoprolol tartrate (LOPRESSOR) 12.5 mg TABS Take 12.5 mg by mouth 2 (two) times daily.      . Multiple Vitamin (MULITIVITAMIN WITH MINERALS) TABS Take 1 tablet by mouth daily.      . ondansetron (ZOFRAN) 4 MG tablet Take 4 mg by mouth every 8 (eight) hours as needed. For nausea       . oxyCODONE (ROXICODONE) 5 MG/5ML solution Take 5 mg by mouth every 4 (four) hours as needed. For pain      . pantoprazole (PROTONIX) 40 MG tablet Take 40 mg by mouth 2 (two) times daily before a meal.      . PRESCRIPTION MEDICATION Inject into the vein continuous. Home TPN.  Patient gets supply from [facility] [phone]).      . QUEtiapine (SEROQUEL) 25 MG tablet Take 25 mg by mouth at bedtime.       . traZODone (DESYREL) 50 MG tablet Take 25-50 mg by mouth at bedtime as needed. For sleep        Insulin Requirements in the past 24 hours:  10 units  Current Nutrition: Ice chips   Nutritional Goals:  1650-1850 kCal, 85-100 grams of protein per day   Assessment:  [name] admitted from [facility] where she is on chronic TPN and takes POs only for pleasure.   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  Heparin gtt for new DVT Hepatobil: alk phos/ALT mildly elevated, AST/tbili WNL - monitor Neuro: hx depression (seroquel /  trazodone PTA), some confusion ID:   Fortaz/Flagyl/Vanc  D#4 for sepsis, GPC/GNR bacteremia, PICC line infxn with  E.coli UTI, PICC line replaced, Tmax 100.7, afebrile today, WBC WNL Best Practices: PPI IV, heparin gtt   Plan:  - Continue Clinimix E5/15 at 80 ml/hr with lipids at 8 ml/hr, to closely match home formula and meeting 100% of patient's needs - MVI and TE MWF only due to national shortage  - Increase insulin slightly to 50 units/bag - Monitor I/O's, lytes - Consider rechecking BMET as expect additional K+ supplementation needed     [name], PharmD, BCPS Pager:  [phone] 10/16/2011, 8:22 AM

## 2011-10-16 NOTE — Evaluation (Signed)
Physical Therapy Evaluation [name] MRN: [id number] DOB: 1931-12-10 Today's Date: 10/16/2011 Time: 7829-5621 PT Time Calculation (min): 18 min  PT Assessment / Plan / Recommendation Clinical Impression  Pt admitted with sepsis and RUE DVT. Pt with limited mobility at SNF at baseline and will benefit from acute therapy to maximize mobility and gait before discharge to return pt to PLOF and decrease burden of care. Recommend OOB daily with nursing assist.     PT Assessment  Patient needs continued PT services    Follow Up Recommendations  Skilled nursing facility    Equipment Recommendations  Defer to next venue    Frequency Min 2X/week    Precautions / Restrictions Precautions Precautions: Fall Precaution Comments: PEG to suction   Pertinent Vitals/Pain RUE pain with RN notified      Mobility  Bed Mobility Bed Mobility: Supine to Sit Supine to Sit: HOB elevated;4: Min guard Details for Bed Mobility Assistance: cueing to sequence and for safety Transfers Transfers: Sit to Stand;Stand to Sit Sit to Stand: 3: Mod assist;From bed Stand to Sit: 4: Min assist;To chair/3-in-1;With armrests Details for Transfer Assistance: Pt educated for hand placement and safety with assist for anterior translation and to control descent to chair Ambulation/Gait Ambulation/Gait Assistance: 4: Min assist Ambulation Distance (Feet): 12 Feet Assistive device: Rolling walker Ambulation/Gait Assistance Details: cueing to step into RW and extend trunk with max encouragement to increase distance Gait Pattern: Step-through pattern;Decreased stride length;Trunk flexed Gait velocity: decreased Stairs: No    Exercises     PT Goals Acute Rehab PT Goals PT Goal Formulation: With patient Time For Goal Achievement: 10/30/11 Potential to Achieve Goals: Good Pt will go Supine/Side to Sit: with supervision PT Goal: Supine/Side to Sit - Progress: Goal set today Pt will go Sit  to Supine/Side: with supervision PT Goal: Sit to Supine/Side - Progress: Goal set today Pt will go Sit to Stand: with supervision PT Goal: Sit to Stand - Progress: Goal set today Pt will go Stand to Sit: with supervision PT Goal: Stand to Sit - Progress: Goal set today Pt will Ambulate: 16 - 50 feet;with min assist;with rolling walker PT Goal: Ambulate - Progress: Goal set today  Visit Information  Last PT Received On: 10/16/11 Assistance Needed: +1    Subjective Data  Subjective: I can walk some. I'm cold. Patient Stated Goal: return to SNF   Prior Functioning  Home Living Lives With: Other (Comment) Available Help at Discharge: Skilled Nursing Facility Type of Home: Skilled Nursing Facility Home Layout: One level Prior Function Level of Independence: Needs assistance Needs Assistance: Bathing;Dressing;Light Housekeeping;Gait;Transfers Bath: Moderate Dressing: Moderate Toileting: Moderate Meal Prep: Total Light Housekeeping: Total Gait Assistance: Pt states she can walk short distances with RW and assist Transfer Assistance: pt stated she at times can get up with supervision and other times needs assist Able to Take Stairs?: No Driving: No Communication Communication: No difficulties    Cognition  Overall Cognitive Status: History of cognitive impairments - at baseline Area of Impairment: Memory;Problem solving Arousal/Alertness: Awake/alert Orientation Level: Time;Situation Behavior During Session: Flat affect Memory: Decreased recall of precautions Memory Deficits: Pt unable to fully recall PLOF or what facility she resides in  Problem Solving: Pt requried assistance for problem solving as she was unable to complete    Extremity/Trunk Assessment Right Lower Extremity Assessment RLE ROM/Strength/Tone: [facility] for tasks assessed Left Lower Extremity Assessment LLE ROM/Strength/Tone: [facility] for tasks assessed   Balance Balance Balance Assessed: No  End of Session PT -  End of Session Equipment Utilized During Treatment: Gait belt Activity Tolerance: Patient tolerated treatment well Patient left: in chair;with call bell/phone within reach Nurse Communication: Mobility status   Delorse Lek 10/16/2011, 12:49 PM  Delaney Meigs, PT [phone]

## 2011-10-16 NOTE — Progress Notes (Addendum)
ANTIBIOTIC/ANTICOAGULANT CONSULT NOTE - FOLLOW UP  Pharmacy Consult for Vancomycin, Gentamicin, Heparin Indication: rule out sepsis and r/o endocarditis and new DVT  Allergies  Allergen Reactions  . Other Swelling    Pinto beans cause swelling and hives  . Penicillins Swelling    Patient Measurements: Height: 5\' 5"  (165.1 cm) Weight: 179 lb 10.8 oz (81.5 kg) IBW/kg (Calculated) : 57  Heparin Dosing Weight: 74.9kg  Vital Signs: Temp: 98.2 F (36.8 C) (04/19 1159) Temp src: Oral (04/19 1159) BP: 126/65 mmHg (04/19 1300) Pulse Rate: 81  (04/19 1300) Intake/Output from previous day: 04/18 0701 - 04/19 0700 In: 4856.7 [I.V.:884.7; IV Piggyback:1860; TPN:2112] Out: 4145 [Urine:4145] Intake/Output from this shift: Total I/O In: 949 [I.V.:171; IV Piggyback:250; TPN:528] Out: 250 [Urine:250]  Labs:  [place] Day 10/16/11 0450 10/15/11 2055 10/15/11 0500 10/14/11 1030  WBC 6.1 -- 7.8 8.8  HGB 8.3* -- 8.5* 8.5*  PLT 182 -- 167 154  LABCREA -- -- -- --  CREATININE 0.56 0.53 0.50 --   Estimated Creatinine Clearance: 61.1 ml/min (by C-G formula based on Cr of 0.56).  Basename 10/16/11 1300 10/15/11 0500  VANCOTROUGH 16.2 11.7  VANCOPEAK -- --  VANCORANDOM -- --  GENTTROUGH -- --  GENTPEAK -- --  GENTRANDOM -- --  TOBRATROUGH -- --  TOBRAPEAK -- --  TOBRARND -- --  AMIKACINPEAK -- --  AMIKACINTROU -- --  AMIKACIN -- --     Microbiology: Recent Results (from the past 720 hour(s))  URINE CULTURE     Status: Normal   Collection Time   10/12/11  7:52 PM      Component Value Range Status Comment   Specimen Description URINE, CATHETERIZED   Final    Special Requests ADDED 10/12/11 2017   Final    Culture  Setup Time [id number]   Final    Colony Count >=100,000 COLONIES/ML   Final    Culture ESCHERICHIA COLI   Final    Report Status 10/15/2011 FINAL   Final    Organism ID, Bacteria ESCHERICHIA COLI   Final   CULTURE, BLOOD (ROUTINE X 2)     Status: Normal (Preliminary  result)   Collection Time   10/12/11  8:00 PM      Component Value Range Status Comment   Specimen Description BLOOD HAND LEFT   Final    Special Requests     Final    Value: BOTTLES DRAWN AEROBIC AND ANAEROBIC AERO 10CC, ANAE 5CC   Culture  Setup Time     Final    Value: [id number] This organism DOES NOT demonstrate inducible Clindamycin resistance in vitro.   Culture     Final    Value: STREPTOCOCCUS SPECIES     STAPHYLOCOCCUS SPECIES (COAGULASE NEGATIVE)     Note: Gram Stain Report Called to,Read Back By and Verified With: CHRISTY FRALEY @1132  10/13/11 BY KRAWS   Report Status PENDING   Incomplete    Organism ID, Bacteria STAPHYLOCOCCUS SPECIES (COAGULASE NEGATIVE)   Final   CULTURE, BLOOD (ROUTINE X 2)     Status: Normal (Preliminary result)   Collection Time   10/12/11  8:13 PM      Component Value Range Status Comment   Specimen Description BLOOD HAND RIGHT   Final    Special Requests BOTTLES DRAWN AEROBIC ONLY 10CC   Final    Culture  Setup Time [id number]   Final    Culture     Final    Value: STREPTOCOCCUS SPECIES  STAPHYLOCOCCUS SPECIES (COAGULASE NEGATIVE)     Note: Gram Stain Report Called to,Read Back By and Verified With: LAUREN CONNALLY @ 1828 ON 10/13/11 BY GOLLD   Report Status PENDING   Incomplete   CATH TIP CULTURE     Status: Normal   Collection Time   10/13/11  3:47 AM      Component Value Range Status Comment   Specimen Description CATH TIP   Final    Special Requests NONE   Final    Culture     Final    Value: >100 COLONIES STAPHYLOCOCCUS SPECIES (COAGULASE NEGATIVE)     Note: RIFAMPIN AND GENTAMICIN SHOULD NOT BE USED AS SINGLE DRUGS FOR TREATMENT OF STAPH INFECTIONS. This organism DOES NOT demonstrate inducible Clindamycin resistance in vitro.   Report Status 10/16/2011 FINAL   Final    Organism ID, Bacteria STAPHYLOCOCCUS SPECIES (COAGULASE NEGATIVE)   Final   MRSA PCR SCREENING     Status: Normal   Collection Time   10/13/11  3:51 AM      Component  Value Range Status Comment   MRSA by PCR NEGATIVE  NEGATIVE  Final   MRSA PCR SCREENING     Status: Normal   Collection Time   10/13/11  3:35 PM      Component Value Range Status Comment   MRSA by PCR NEGATIVE  NEGATIVE  Final   CULTURE, BLOOD (ROUTINE X 2)     Status: Normal (Preliminary result)   Collection Time   10/13/11  5:00 PM      Component Value Range Status Comment   Specimen Description BLOOD RIGHT ARM   Final    Special Requests BOTTLES DRAWN AEROBIC ONLY 10CC   Final    Culture  Setup Time [id number]   Final    Culture     Final    Value: GRAM POSITIVE COCCI IN CLUSTERS     Note: Gram Stain Report Called to,Read Back By and Verified With: MARK SIVNGE @0436  ON 10/16/11 BY MCLET   Report Status PENDING   Incomplete   CULTURE, BLOOD (ROUTINE X 2)     Status: Normal   Collection Time   10/13/11  5:08 PM      Component Value Range Status Comment   Specimen Description BLOOD RIGHT HAND   Final    Special Requests BOTTLES DRAWN AEROBIC ONLY 5CC   Final    Culture  Setup Time [id number]   Final    Culture     Final    Value: STAPHYLOCOCCUS SPECIES (COAGULASE NEGATIVE)     Note: SUSCEPTIBILITIES PERFORMED ON PREVIOUS CULTURE WITHIN THE LAST 5 DAYS.     Note: Gram Stain Report Called to,Read Back By and Verified With: ASHLEY HARRISON @0627  ON 10/15/11 BY MCLET   Report Status 10/16/2011 FINAL   Final   URINE CULTURE     Status: Normal   Collection Time   10/13/11  5:12 PM      Component Value Range Status Comment   Specimen Description URINE, CATHETERIZED   Final    Special Requests NONE   Final    Culture  Setup Time [id number]   Final    Colony Count NO GROWTH   Final    Culture NO GROWTH   Final    Report Status 10/14/2011 FINAL   Final     Anti-infectives     Start     Dose/Rate Route Frequency Ordered Stop   10/16/11 1500   gentamicin (GARAMYCIN)  70 mg in dextrose 5 % 50 mL IVPB        70 mg 103.5 mL/hr over 30 Minutes Intravenous Every 8 hours 10/16/11 1425      10/15/11 1400   vancomycin (VANCOCIN) IVPB 1000 mg/200 mL premix        1,000 mg 200 mL/hr over 60 Minutes Intravenous Every 8 hours 10/15/11 0634     10/13/11 2200   aztreonam (AZACTAM) 1 g in dextrose 5 % 50 mL IVPB  Status:  Discontinued        1 g 100 mL/hr over 30 Minutes Intravenous 3 times per day 10/13/11 1619 10/13/11 1639   10/13/11 1800   cefTAZidime (FORTAZ) 1 g in dextrose 5 % 50 mL IVPB  Status:  Discontinued        1 g 100 mL/hr over 30 Minutes Intravenous Every 8 hours 10/13/11 1640 10/16/11 1201   10/13/11 1615   aztreonam (AZACTAM) 2 g in dextrose 5 % 50 mL IVPB  Status:  Discontinued        2 g 100 mL/hr over 30 Minutes Intravenous  Once 10/13/11 1611 10/13/11 1612   10/13/11 1615   vancomycin (VANCOCIN) IVPB 1000 mg/200 mL premix  Status:  Discontinued        1,000 mg 200 mL/hr over 60 Minutes Intravenous  Once 10/13/11 1611 10/13/11 1612   10/13/11 1615   metroNIDAZOLE (FLAGYL) IVPB 500 mg  Status:  Discontinued        500 mg 100 mL/hr over 60 Minutes Intravenous  Once 10/13/11 1611 10/13/11 1612   10/13/11 1430   Ampicillin-Sulbactam (UNASYN) 3 g in sodium chloride 0.9 % 100 mL IVPB  Status:  Discontinued        3 g 100 mL/hr over 60 Minutes Intravenous STAT 10/13/11 1417 10/13/11 1444   10/13/11 0600   vancomycin (VANCOCIN) 1,250 mg in sodium chloride 0.9 % 250 mL IVPB  Status:  Discontinued        1,250 mg 166.7 mL/hr over 90 Minutes Intravenous Every 12 hours 10/13/11 0457 10/15/11 0632   10/13/11 0600   aztreonam (AZACTAM) 1 g in dextrose 5 % 50 mL IVPB  Status:  Discontinued        1 g 100 mL/hr over 30 Minutes Intravenous 3 times per day 10/13/11 0507 10/13/11 1526   10/13/11 0400   metroNIDAZOLE (FLAGYL) IVPB 500 mg  Status:  Discontinued        500 mg 100 mL/hr over 60 Minutes Intravenous Every 8 hours 10/13/11 0323 10/16/11 1201   10/12/11 1915   vancomycin (VANCOCIN) IVPB 1000 mg/200 mL premix        1,000 mg 200 mL/hr over 60 Minutes  Intravenous  Once 10/12/11 1901 10/12/11 2227   10/12/11 1915   ceFEPIme (MAXIPIME) 2 g in dextrose 5 % 50 mL IVPB        2 g 100 mL/hr over 30 Minutes Intravenous To Minor Emergency Dept 10/12/11 1901 10/12/11 2105          Assessment: 78 yo female with new upper extremity DVT on doppler. H/H 8.3/24.8; Plt 182; INR on 04/16 1.58 (not on coumadin PTA). Heparin level therapeutic but at lower end of goal and may still be experiencing effects from loading dose so will increase slightly. Patient also on vancomycin with coag negative staph as well as gram positive cocci in clusters found in blood cultures. Fortaz and flagyl d/c/d today. ID and CCM teams concerned for potential endocarditis. Pharmacy  consulted to add on low dose gentamicin for synergy. Vancomycin trough today is therapeutic @ 16.2. Renal function is stable.  Abx: Vanc>>4/15 Gentamicin>>4/19 Flagyl >>4/16>>4/19 Elita Quick 4/16>>4/19  Cx: BC x 2 4/16 BC x 2 4/15 1/2 GPC rods and GPC chains; 1/2 GPC clusters  PICC tip 4/16>>>CNS  Ur cx> E. coli Cdiff + (back on 2/17)  MRSA pcr 4/16: negative     Goal of Therapy:  Vancomycin trough level 15-20 mcg/ml Heparin level: 0.3-0.7 units/ml  Plan:  1) Increase Heparin to 1850 units/hr 2) Continue Vancomycin 1 gram IV q8hrs 3) Start Gentamycin 1mg /kg IV q8hrs  4) F/u AM Heparin Level and daily CBC 5) F/u renal fx, cxs, plan to check gentamycin trough on Sunday or Monday with boarderline renal function  Janace Litten, PharmD [phone] 10/16/2011,2:45 PM

## 2011-10-16 NOTE — Progress Notes (Signed)
  Echocardiogram 2D Echocardiogram has been performed.  [name] 10/16/2011, 4:15 PM

## 2011-10-16 NOTE — Progress Notes (Signed)
Name: [name] MRN: [id number] DOB: 1932/06/15    LOS: 4  PCCM ADMISSION NOTE  History of Present Illness: 78 year old female with PMH perforated duodenal ulcer, status post antrectomy, duodenal resection, Billroth 2 reconstruction, omental patching of Duodenal bulb, open cholecystectomy, gastrostomy and jejunostomy on 05/07/11. Her course was complicated by epigastric abscess requiring percutaneous drain per IR 05/31/11. At discharge she was noted to have malnutrition and short-gut syndrome requiring chronic TNA. Also most recently treated for C diff 07/2011. Brought to the ER on 4/15 w/ fever and purulent discharge from PICC insertion site, abd pain and UA concerning for UTI. On presentation was hypotensive and confused. Her BP Initially responded to IVFs. She was started on antibiotics, PICC line was removed, and she was admitted to SDU. Later in the afternoon on 4/16 she again became hypotensive w/ SBP in 40s-60s (had already gotten 5 liters in ER of IVFs). PCCM was asked to see.    Lines, Tubes, etc:  PICC - removed 4/16  CVC - 4/16 >>> PEG - 82 days old   Microbiology:  Blood cultures: GPC clusters>>> coag neg staph Cath tip>>>coag neg staph  Urine: ecoli   Antibiotics:  Vancomycin 4/16 >> Flagyl 4/16 >>> 4/18 Ceftazidime 4/16 >>> 4/19  Studies/Events:  4/16 tx to ICU for worsening shock  CT arm 4/19>>>  CT abd/pelvis 4/16: Postsurgical changes in the upper abdomen. Residual low density  fluid or edema around the liver has minimally changed since 07/29/2011.  Tiny pleural effusions with bibasilar atelectasis   RUE [place] 4/17>>>DVT noted in the right subclavian vein, axillary vein and brachial vein. Radial and ulnar veins patent. Cephalic and basilic veins patent.   Consults:  ID     . cefTAZidime (FORTAZ)  IV  1 g Intravenous Q8H  . heparin  2,500 Units Intravenous Once  . insulin aspart  0-9 Units Subcutaneous Q4H  . magnesium sulfate LVP 250-500 ml  6 g Intravenous  Once  . metronidazole  500 mg Intravenous Q8H  . pantoprazole (PROTONIX) IV  40 mg Intravenous Q24H  . potassium chloride  10 mEq Intravenous Q1 Hr x 6  . potassium chloride  10 mEq Intravenous Q1 Hr x 6  . potassium chloride  10 mEq Intravenous Q1 Hr x 2  . vancomycin  1,000 mg Intravenous Q8H  . DISCONTD: magnesium sulfate 1 - 4 g bolus IVPB  2 g Intravenous Once  . DISCONTD: potassium chloride  10 mEq Intravenous Q1 Hr x 6   Subjective: patient complaining of pain inadequately controlled. Mainly abdominal pain around the wound site. No open wounds or pus noted around the PEG tube.  Vital Signs: Temp:  [98.3 F (36.8 C)-100.3 F (37.9 C)] 100 F (37.8 C) (04/19 0405) Pulse Rate:  [81-102] 89  (04/19 0700) Resp:  [7-44] 22  (04/19 0700) BP: (112-170)/(52-89) 134/71 mmHg (04/19 0700) SpO2:  [95 %-99 %] 96 % (04/19 0700) Weight:  [179 lb 10.8 oz (81.5 kg)] 179 lb 10.8 oz (81.5 kg) (04/19 0500) I/O last 3 completed shifts: In: 6800.7 [I.V.:1262.7; IV Piggyback:2370] Out: 1191 [YNWGN:5621; Drains:100]  Physical Examination:  General: Awake and oriented times 2, knows name and place  Neuro: Moves all four extremities, no focal deficits, dementia  HEENT: EOM intact, PERRLA  Neck: Supple, no JVD  Cardiovascular: S1 S2 heard  Lungs: Good air flow bilaterally, no coarse sounds  Abdomen: PEG in place, set to suction, multiple scars, some pus around midline scar, no erythema  Musculoskeletal:  No focal weakness, left arm is swollen/edematous as compared to right and much warm as compared to left, tender to touch Skin: No breakdown noted   Labs and Imaging:  Reviewed.  Please refer to the Assessment and Plan section for relevant results.  ASSESSMENT AND PLAN  NEUROLOGIC  A: Delirium/Acute Encephalopathy - A and O times 2, will continue to monitor with fluids  Dementia - at baseline, no family to talk to today. Will watch.  P:  better pain control PT consult  yesterday   PULMONARY  Lab 10/14/11 0409 10/13/11 2045 10/13/11 1800 10/13/11 1658  PHART 7.365 -- -- 7.374  PCO2ART 30.1* -- -- 27.1*  PO2ART 84.0 -- -- 63.0*  HCO3 17.1* -- -- 15.8*  O2SAT 96.0 74.6 56.9 92.0   A: Some vascular congestion on 4/16 CXR P: Overall positive 700cc, kvo, low threshold lasix   CARDIOVASCULAR  Lab 10/14/11 0500 10/13/11 1630 10/13/11 0530 10/12/11 2019  TROPONINI -- <0.30 -- --  LATICACIDVEN 0.8 1.3 2.1 4.0*  PROBNP -- -- -- --   A: No acute AMI - CE taken on admission negative and no significant heart history.  P: dc tele  RENAL  Lab 10/16/11 0450 10/15/11 2055 10/15/11 0500 10/14/11 1100 10/14/11 0500 10/13/11 1630  NA 130* 129* 129* -- 134* 133*  K 2.8* 2.8* -- -- -- --  CL 97 96 98 -- 106 104  CO2 24 23 22  -- 19 18*  BUN 11 9 10  -- 15 19  CREATININE 0.56 0.53 0.50 -- 0.64 0.75  CALCIUM 8.0* 8.4 8.2* -- 7.8* 7.9*  MG 2.0 2.6* 1.3* 1.5 -- --  PHOS -- 3.1 3.2 2.8 -- --   A: Hypokalemia - 2.8 today, will replete. Daily pos balance Hypomagnesemia - 2.0 today.  AKI - Resolved, Cr 0.5 today.  Hyponatremia - resuscitated, low threshold lasix, bmet in am after K supp kvo  GASTROINTESTINAL  Lab 10/15/11 0500 10/14/11 0500 10/13/11 1630 10/13/11 0530 10/12/11 2015  AST 20 46* 73* 89* 47*  ALT 40* 57* 76* 72* 40*  ALKPHOS 119* 113 145* 133* 174*  BILITOT 0.4 0.3 0.4 0.2* 0.3  PROT 5.7* 5.2* 6.1 5.9* 7.2  ALBUMIN 1.9* 1.8* 2.1* 2.1* 2.6*   A: S/P multiple abdominal surgeries - PEG to suction, draining bilious fluid, non-bloody. One wound has some purulent material on it, does not appear to be open. No signs of obstruction and + BS today. Denies abdominal pain currently.  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 Dif - Treated in Feb, no active diarrhea.  P: PEG to suction, nutrition for TNA, follow output Will need a longer PICC access new on left for TPN pre dc  HEMATOLOGIC  Lab 10/16/11 0450  10/15/11 0500 10/15/11 0215 10/14/11 1030 10/14/11 0500 10/13/11 1630  HGB 8.3* 8.5* -- 8.5* 7.0* 8.3*  HCT 24.8* 24.9* -- 25.3* 21.0* 24.8*  PLT 182 167 -- 154 153 172  INR -- -- 1.47 -- -- 1.58*  APTT -- -- -- -- -- 53*   A: Shock - Likely due to bacteremia now resolved.  Acute Anemia - Likely anemia of critical illness. One unit of PRBC ordered this AM for Hg 7. No signs active bleeding.  DVT  in R UE - US showed no abscess but clot. Started heparin, may need vascular consult.  P: no signs of active bleeding. Will monitor closely..  hgb goal to less than 7 Hep drip, see ID  INFECTIOUS  Lab 10/16/11 0450 10/15/11 0500 10/14/11 1030 10/14/11 0500 10/13/11 1630 10/12/11 2015  WBC 6.1 7.8 8.8 8.4 11.2* --  PROCALCITON -- -- -- 22.86 33.63 59.17   Infectious Disease:  A: Bacteremia - blood cultures Gram + cocci clusters and GPR. Some likely contaminant but covered. On Vanc/flagyl/ceftaz. ID seeing, await urine and catheter tip cultures.   C. Dif - Treated in Feb, no active diarrhea this admission. Will watch closely.  ???  septic thrombophlebitis abscess not seen on [place] Coag n eg staph is pathogen P: Vanc continue Echo assess veg Concern septic thrombophlebitis, CT arm and consult vascular,  Will need to dc CVL as at risk seeded this, then picc to left arm when needed Would favor holiday  Line, plan dc tpn x 3 days , dc line neck then replace picc to left arm in 72 hrs  ENDOCRINE  Lab 10/16/11 0357 10/16/11 0019 10/15/11 2044 10/15/11 1642 10/15/11 1147  GLUCAP 125* 154* 138* 163* 161*   A: DM II - CBG check with SSI as needed.  No thyroid disease.  Metabolic acidosis - AG on admission, none currently (AG 9 this AM). From lactate levels which have resolved.  P: CBG monitoring with SSI as needed.   BEST PRACTICE / DISPOSITION DVT heparin  SUP: protonix  Nutrition: NPO c chips, suction from PEG, tna  Glycemic control: CBG monitoring SSI as needed  Sedation/analgesia:  tylenol   Lars Mage MD [facility] Resident Pager [phone]  10/16/2011, 7:22 AM  To floor, triad  I have fully examined this patient and agree with above findings.    And edited in full.  Mcarthur Rossetti. Tyson Alias, MD, FACP Pgr: [phone] [facility]

## 2011-10-16 NOTE — Progress Notes (Signed)
Subjective: No new complaints  Antibiotics:  Anti-infectives     Start     Dose/Rate Route Frequency Ordered Stop   10/15/11 1400   vancomycin (VANCOCIN) IVPB 1000 mg/200 mL premix        1,000 mg 200 mL/hr over 60 Minutes Intravenous Every 8 hours 10/15/11 0634     10/13/11 2200   aztreonam (AZACTAM) 1 g in dextrose 5 % 50 mL IVPB  Status:  Discontinued        1 g 100 mL/hr over 30 Minutes Intravenous 3 times per day 10/13/11 1619 10/13/11 1639   10/13/11 1800   cefTAZidime (FORTAZ) 1 g in dextrose 5 % 50 mL IVPB        1 g 100 mL/hr over 30 Minutes Intravenous Every 8 hours 10/13/11 1640     10/13/11 1615   aztreonam (AZACTAM) 2 g in dextrose 5 % 50 mL IVPB  Status:  Discontinued        2 g 100 mL/hr over 30 Minutes Intravenous  Once 10/13/11 1611 10/13/11 1612   10/13/11 1615   vancomycin (VANCOCIN) IVPB 1000 mg/200 mL premix  Status:  Discontinued        1,000 mg 200 mL/hr over 60 Minutes Intravenous  Once 10/13/11 1611 10/13/11 1612   10/13/11 1615   metroNIDAZOLE (FLAGYL) IVPB 500 mg  Status:  Discontinued        500 mg 100 mL/hr over 60 Minutes Intravenous  Once 10/13/11 1611 10/13/11 1612   10/13/11 1430   Ampicillin-Sulbactam (UNASYN) 3 g in sodium chloride 0.9 % 100 mL IVPB  Status:  Discontinued        3 g 100 mL/hr over 60 Minutes Intravenous STAT 10/13/11 1417 10/13/11 1444   10/13/11 0600   vancomycin (VANCOCIN) 1,250 mg in sodium chloride 0.9 % 250 mL IVPB  Status:  Discontinued        1,250 mg 166.7 mL/hr over 90 Minutes Intravenous Every 12 hours 10/13/11 0457 10/15/11 0632   10/13/11 0600   aztreonam (AZACTAM) 1 g in dextrose 5 % 50 mL IVPB  Status:  Discontinued        1 g 100 mL/hr over 30 Minutes Intravenous 3 times per day 10/13/11 0507 10/13/11 1526   10/13/11 0400   metroNIDAZOLE (FLAGYL) IVPB 500 mg        500 mg 100 mL/hr over 60 Minutes Intravenous Every 8 hours 10/13/11 0323     10/12/11 1915   vancomycin (VANCOCIN) IVPB 1000 mg/200 mL premix         1,000 mg 200 mL/hr over 60 Minutes Intravenous  Once 10/12/11 1901 10/12/11 2227   10/12/11 1915   ceFEPIme (MAXIPIME) 2 g in dextrose 5 % 50 mL IVPB        2 g 100 mL/hr over 30 Minutes Intravenous To [facility] 10/12/11 1901 10/12/11 2105          Medications: Scheduled Meds:    . cefTAZidime (FORTAZ)  IV  1 g Intravenous Q8H  . heparin  2,500 Units Intravenous Once  . insulin aspart  0-9 Units Subcutaneous Q4H  . magnesium sulfate LVP 250-500 ml  6 g Intravenous Once  . metronidazole  500 mg Intravenous Q8H  . pantoprazole (PROTONIX) IV  40 mg Intravenous Q24H  . potassium chloride  10 mEq Intravenous Q1 Hr x 6  . potassium chloride  10 mEq Intravenous Q1 Hr x 6  . potassium chloride  10 mEq Intravenous Q1 Hr  x 2  . vancomycin  1,000 mg Intravenous Q8H  . DISCONTD: magnesium sulfate 1 - 4 g bolus IVPB  2 g Intravenous Once   Continuous Infusions:    . sodium chloride 20 mL/hr at 10/14/11 1113  . sodium chloride 20 mL/hr at 10/14/11 1100  . TPN (CLINIMIX) +/- additives 80 mL/hr at 10/14/11 1733   And  . fat emulsion 250 mL (10/14/11 1733)  . TPN (CLINIMIX) +/- additives 80 mL/hr at 10/15/11 1720   And  . fat emulsion 250 mL (10/15/11 1720)  . TPN (CLINIMIX) +/- additives     And  . fat emulsion    . heparin 1,850 Units/hr (10/16/11 0800)  . vasopressin (PITRESSIN) infusion - *FOR SHOCK*    . DISCONTD: heparin 1,550 Units/hr (10/15/11 1400)   PRN Meds:.acetaminophen, acetaminophen, DOBUTamine, fentaNYL, norepinephrine (LEVOPHED) Adult infusion, ondansetron (ZOFRAN) IV, ondansetron, sodium chloride, vasopressin (PITRESSIN) infusion - *FOR SHOCK*, DISCONTD: fentaNYL   Objective: Weight change: -10.6 oz (-0.3 kg)  Intake/Output Summary (Last 24 hours) at 10/16/11 1155 Last data filed at 10/16/11 1100  Gross per 24 hour  Intake 4970.74 ml  Output   3965 ml  Net 1005.74 ml   Blood pressure 139/71, pulse 89, temperature 98.8 F (37.1 C),  temperature source Oral, resp. rate 21, height 5\' 5"  (1.651 m), weight 179 lb 10.8 oz (81.5 kg), SpO2 96.00%. Temp:  [98.3 F (36.8 C)-100.3 F (37.9 C)] 98.8 F (37.1 C) (04/19 0829) Pulse Rate:  [81-102] 89  (04/19 1100) Resp:  [7-44] 21  (04/19 1100) BP: (112-170)/(52-89) 139/71 mmHg (04/19 1100) SpO2:  [95 %-99 %] 96 % (04/19 1100) Weight:  [179 lb 10.8 oz (81.5 kg)] 179 lb 10.8 oz (81.5 kg) (04/19 0500)  Physical Exam: General: Alert and awake, Chronically ill appearing, fatigued oriented having foley changed   Lab Results:  Basename 10/16/11 0450 10/15/11 0500  WBC 6.1 7.8  HGB 8.3* 8.5*  HCT 24.8* 24.9*  PLT 182 167    BMET  Basename 10/16/11 0450 10/15/11 2055  NA 130* 129*  K 2.8* 2.8*  CL 97 96  CO2 24 23  GLUCOSE 122* 159*  BUN 11 9  CREATININE 0.56 0.53  CALCIUM 8.0* 8.4    Micro Results: Recent Results (from the past 240 hour(s))  URINE CULTURE     Status: Normal   Collection Time   10/12/11  7:52 PM      Component Value Range Status Comment   Specimen Description URINE, CATHETERIZED   Final    Special Requests ADDED 10/12/11 2017   Final    Culture  Setup Time [id number]   Final    Colony Count >=100,000 COLONIES/ML   Final    Culture ESCHERICHIA COLI   Final    Report Status 10/15/2011 FINAL   Final    Organism ID, Bacteria ESCHERICHIA COLI   Final   CULTURE, BLOOD (ROUTINE X 2)     Status: Normal (Preliminary result)   Collection Time   10/12/11  8:00 PM      Component Value Range Status Comment   Specimen Description BLOOD HAND LEFT   Final    Special Requests     Final    Value: BOTTLES DRAWN AEROBIC AND ANAEROBIC AERO 10CC, ANAE 5CC   Culture  Setup Time     Final    Value: [id number] This organism DOES NOT demonstrate inducible Clindamycin resistance in vitro.   Culture     Final    Value: STREPTOCOCCUS  SPECIES     STAPHYLOCOCCUS SPECIES (COAGULASE NEGATIVE)     Note: Gram Stain Report Called to,Read Back By and Verified With:  CHRISTY FRALEY @1132  10/13/11 BY KRAWS   Report Status PENDING   Incomplete    Organism ID, Bacteria STAPHYLOCOCCUS SPECIES (COAGULASE NEGATIVE)   Final   CULTURE, BLOOD (ROUTINE X 2)     Status: Normal (Preliminary result)   Collection Time   10/12/11  8:13 PM      Component Value Range Status Comment   Specimen Description BLOOD HAND RIGHT   Final    Special Requests BOTTLES DRAWN AEROBIC ONLY 10CC   Final    Culture  Setup Time [id number]   Final    Culture     Final    Value: STREPTOCOCCUS SPECIES     STAPHYLOCOCCUS SPECIES (COAGULASE NEGATIVE)     Note: Gram Stain Report Called to,Read Back By and Verified With: LAUREN CONNALLY @ 1828 ON 10/13/11 BY GOLLD   Report Status PENDING   Incomplete   CATH TIP CULTURE     Status: Normal   Collection Time   10/13/11  3:47 AM      Component Value Range Status Comment   Specimen Description CATH TIP   Final    Special Requests NONE   Final    Culture     Final    Value: >100 COLONIES STAPHYLOCOCCUS SPECIES (COAGULASE NEGATIVE)     Note: RIFAMPIN AND GENTAMICIN SHOULD NOT BE USED AS SINGLE DRUGS FOR TREATMENT OF STAPH INFECTIONS. This organism DOES NOT demonstrate inducible Clindamycin resistance in vitro.   Report Status 10/16/2011 FINAL   Final    Organism ID, Bacteria STAPHYLOCOCCUS SPECIES (COAGULASE NEGATIVE)   Final   MRSA PCR SCREENING     Status: Normal   Collection Time   10/13/11  3:51 AM      Component Value Range Status Comment   MRSA by PCR NEGATIVE  NEGATIVE  Final   MRSA PCR SCREENING     Status: Normal   Collection Time   10/13/11  3:35 PM      Component Value Range Status Comment   MRSA by PCR NEGATIVE  NEGATIVE  Final   CULTURE, BLOOD (ROUTINE X 2)     Status: Normal (Preliminary result)   Collection Time   10/13/11  5:00 PM      Component Value Range Status Comment   Specimen Description BLOOD RIGHT ARM   Final    Special Requests BOTTLES DRAWN AEROBIC ONLY 10CC   Final    Culture  Setup Time [id number]   Final     Culture     Final    Value: GRAM POSITIVE COCCI IN CLUSTERS     Note: Gram Stain Report Called to,Read Back By and Verified With: MARK SIVNGE @0436  ON 10/16/11 BY MCLET   Report Status PENDING   Incomplete   CULTURE, BLOOD (ROUTINE X 2)     Status: Normal   Collection Time   10/13/11  5:08 PM      Component Value Range Status Comment   Specimen Description BLOOD RIGHT HAND   Final    Special Requests BOTTLES DRAWN AEROBIC ONLY 5CC   Final    Culture  Setup Time [id number]   Final    Culture     Final    Value: STAPHYLOCOCCUS SPECIES (COAGULASE NEGATIVE)     Note: SUSCEPTIBILITIES PERFORMED ON PREVIOUS CULTURE WITHIN THE LAST 5 DAYS.     Note: Gram  Stain Report Called to,Read Back By and Verified With: ASHLEY HARRISON @0627  ON 10/15/11 BY MCLET   Report Status 10/16/2011 FINAL   Final   URINE CULTURE     Status: Normal   Collection Time   10/13/11  5:12 PM      Component Value Range Status Comment   Specimen Description URINE, CATHETERIZED   Final    Special Requests NONE   Final    Culture  Setup Time [id number]   Final    Colony Count NO GROWTH   Final    Culture NO GROWTH   Final    Report Status 10/14/2011 FINAL   Final     Studies/Results: No results found.    Assessment/Plan: [name] is a 78 y.o. female admtited withseptic physiology and confusing micro findings  Inititally. Now data showing CNS bacteremia that is persistent and concerning for septic thrombophlebitis and endovascular infection   1) CNS bactermia possible septic thrombophebitis: She also reportedly may have a 2nd organism in 2/2 admission cultures. GPR only in 1/2  --continue vancomycin --agree with vascular surgery consult and imaging of arm --DC central line and repeat surveillance cultures tomorrow -dc flagyl  2) E Coli in urine: likely a colonizer rather than culprit and she has already had 4 days of abx --dc gram negative coverage  Dr. Drue Second will be covering this weekend and is  available for questions.    LOS: 4 days   Acey Lav 10/16/2011, 11:55 AM

## 2011-10-16 NOTE — Progress Notes (Signed)
Pt has order for subclavian CVC to be discontinued. Attempts to start PIV were made and unsuccessful. Pt is getting runs of K+ and continuous IV antibiotics. MD was notified and orders to leave CVC in were given to continue treatment.   Dmonte Maher Charity fundraiser

## 2011-10-16 NOTE — Consult Note (Signed)
[facility] [place] CONSULT NOTE 10/16/2011 DOB: [id number] MRN : [id number]  CC: DVT RUE, PICC line infection with staph aureus coag neg Referring Physician: D.  Tyson Alias, MD  History of Present Illness:  78 year old female with PMH perforated duodenal ulcer, status post antrectomy, duodenal resection, Billroth 2 reconstruction, omental patching of Duodenal bulb, open cholecystectomy, gastrostomy and jejunostomy on 05/07/11. Her course was complicated by epigastric abscess 05/31/11.  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  Brought to the ER on 4/15 w/ fever and purulent discharge from PICC insertion site, abd pain and UA concerning for UTI.   She was started on antibiotics, PICC line was removed, and tip grew staph aureus coag negative. She has had swelling of RUE and USG revealed right SCV, Axillary and brachial DVT. Basilic and cephalic veins compressible. Pt was started on IV heparin. She also had erythema at the medial antecubital space which has been increasing in size. Pt states pain in the area is better. Denies numbness/tingling in the hand and arm.   Past Medical History  Diagnosis Date  . Hypertension   . Reflux   . Right elbow pain     OTIF  . Dementia   . Depression   . Osteoarthritis   . Pancreatitis 11/2007    HOP  . Angiomyolipoma of kidney     right  . Ulcerative esophagitis 12/05/2007    hx elevated gastrin, severe on EGD by Dr Jena Gauss , h pylori negative  . Hiatal hernia   . S/P colonoscopy 2009    pt reports normal by Dr Lovell Sheehan  . Diabetes mellitus   . Anemia   . Hyponatremia   . Cellulitis   . Esophagitis   . Peptic ulcer   . Insomnia     Past Surgical History  Procedure Date  . Orif right hip 1999    [facility]  . Umbilical hernia repair 80 months old    [place]  . Esophagogastroduodenoscopy 01/28/2011    Procedure: ESOPHAGOGASTRODUODENOSCOPY (EGD);  Surgeon: Arlyce Harman, MD;   Location: [facility];  Service: Endoscopy;  Laterality: N/A;  . Colonoscopy 01/28/2011    Procedure: COLONOSCOPY;  Surgeon: Arlyce Harman, MD;  Location: [facility];  Service: Endoscopy;  Laterality: N/A;  . Laparotomy 02/04/2011    Procedure: EXPLORATORY LAPAROTOMY;  Surgeon: Fabio Bering;  Location: [facility];  Service: General;  Laterality: N/A;  . Laparotomy 05/07/2011    Procedure: EXPLORATORY LAPAROTOMY;  Surgeon: Ardeth Sportsman, MD;  Location: [facility] OR;  Service: General;  Laterality: N/A;  lysis of adhesions  . Gastrostomy 05/07/2011    Procedure: GASTROSTOMY;  Surgeon: Ardeth Sportsman, MD;  Location: [facility] OR;  Service: General;  Laterality: N/A;  g-tube / j -tube placement  . Gastrectomy 05/07/2011    Procedure: GASTRECTOMY;  Surgeon: Ardeth Sportsman, MD;  Location: [facility] OR;  Service: General;  Laterality: N/A;  Partial gastrectomy  . Cholecystectomy 05/07/2011    Procedure: CHOLECYSTECTOMY;  Surgeon: Ardeth Sportsman, MD;  Location: [facility] OR;  Service: General;  Laterality: N/A;  open     ROS: [x]  Positive  [ ]  Denies    General: [ ]  Weight loss, [ ]  Fever, [ ]  chills Neurologic: [ ]  Dizziness, [ ]  Blackouts, [ ]  Seizure [ ]  Stroke, [ ]  "Mini stroke", [ ]  Slurred speech, [ ]  Temporary blindness; [ ]  weakness in arms or legs, [ ]  Hoarseness  Cardiac: [ ]  Chest pain/pressure, [ ]  Shortness of breath at rest [ ]  Shortness of breath with exertion, [ ]  Atrial fibrillation or irregular heartbeat Vascular: [ ]  Pain in legs with walking, [ ]  Pain in legs at rest, [ ]  Pain in legs at night,  [ ]  Non-healing ulcer, [x ] Blood clot in vein/DVT,   Pulmonary: [ ]  Home oxygen, [ ]  Productive cough, [ ]  Coughing up blood, [ ]  Asthma,  [ ]  Wheezing Musculoskeletal:  [ ]  Arthritis, [ ]  Low back pain, [ ]  Joint pain Hematologic: [ ]  Easy Bruising, [x ] Anemia; [ ]  Hepatitis Gastrointestinal: [ ]  Blood in stool, [ ]  Gastroesophageal Reflux/heartburn, [ ]  Trouble swallowing Urinary: [ ]  chronic Kidney  disease, [ ]  on HD - [ ]  MWF or [ ]  TTHS, [ ]  Burning with urination, [ ]  Difficulty urinating Skin: [ ]  Rashes, [ ]  Wounds Psychological: [ ]  Anxiety, [ ]  Depression  Social History History  Substance Use Topics  . Smoking status: Never Smoker   . Smokeless tobacco: Current User    Types: Chew  . Alcohol Use: No     Hx of Alcohol dependecy     Family History Family History  Problem Relation Age of Onset  . Cancer Mother     pelvic     Allergies  Allergen Reactions  . Other Swelling    Pinto beans cause swelling and hives  . Penicillins Swelling    Current Facility-Administered Medications  Medication Dose Route Frequency Provider Last Rate Last Dose  . 0.9 %  sodium chloride infusion   Intravenous Continuous Judie Bonus, MD 20 mL/hr at 10/14/11 1113    . acetaminophen (TYLENOL) tablet 650 mg  650 mg Oral Q6H PRN Eduard Clos, MD   650 mg at 10/13/11 1729   Or  . acetaminophen (TYLENOL) suppository 650 mg  650 mg Rectal Q6H PRN Eduard Clos, MD   650 mg at 10/15/11 0122  . tpn solution (CLINIMIX E 5/15) 2,000 mL with multivitamins adult 10 mL, trace elements Cr-Cu-Mn-Se-Zn 1 mL, insulin regular 40 Units infusion   Intravenous Continuous TPN Drusilla Kanner, PHARMD 80 mL/hr at 10/14/11 1733     And  . fat emulsion 20 % infusion 250 mL  250 mL Intravenous Continuous TPN Drusilla Kanner, PHARMD 8 mL/hr at 10/14/11 1733 250 mL at 10/14/11 1733  . fentaNYL (SUBLIMAZE) injection 12.5-25 mcg  12.5-25 mcg Intravenous Q2H PRN Lonia Farber, MD   25 mcg at 10/16/11 1309  . gentamicin (GARAMYCIN) 70 mg in dextrose 5 % 50 mL IVPB  70 mg Intravenous Q8H Christopher T Elder, PHARMD      . heparin ADULT infusion 100 units/mL (25000 units/250 mL)  1,850 Units/hr Intravenous Continuous Alyson Reedy, MD 18.5 mL/hr at 10/16/11 0800 1,850 Units/hr at 10/16/11 0800  . heparin bolus via infusion 2,500 Units  2,500 Units Intravenous Once Alyson Reedy,  MD   2,500 Units at 10/15/11 2300  . insulin aspart (novoLOG) injection 0-9 Units  0-9 Units Subcutaneous Q4H Gildardo Cranker Pinedale, PHARMD   2 Units at 10/16/11 1200  . magnesium sulfate 6 g in dextrose 5 % 250 mL  6 g Intravenous Once Nelda Bucks, MD 43.7 mL/hr at 10/15/11 1500 6 g at 10/15/11 1500  . ondansetron (ZOFRAN) tablet 4 mg  4 mg Oral Q6H PRN Eduard Clos, MD       Or  . ondansetron [facility]) injection 4  mg  4 mg Intravenous Q6H PRN Eduard Clos, MD      . ondansetron [facility]) injection 4 mg  4 mg Intravenous Q4H PRN Lars Mage, MD      . pantoprazole (PROTONIX) injection 40 mg  40 mg Intravenous Q24H Kalman Shan, MD   40 mg at 10/15/11 2126  . potassium chloride 10 mEq in 100 mL IVPB  10 mEq Intravenous Q1 Hr x 6 Alanson Puls, MD   10 mEq at 10/16/11 0816  . potassium chloride 10 mEq in 100 mL IVPB  10 mEq Intravenous Q1 Hr x 6 Ankit Garg, MD      . potassium chloride 10 mEq in 50 mL *CENTRAL LINE* IVPB  10 mEq Intravenous Q1 Hr x 2 Nelda Bucks, MD   10 mEq at 10/15/11 1717  . vancomycin (VANCOCIN) IVPB 1000 mg/200 mL premix  1,000 mg Intravenous Q8H Colleen Can, PHARMD   1,000 mg at 10/16/11 0502  . DISCONTD: 0.9 %  sodium chloride infusion   Intravenous Continuous Simonne Martinet, NP 20 mL/hr at 10/14/11 1100    . DISCONTD: cefTAZidime (FORTAZ) 1 g in dextrose 5 % 50 mL IVPB  1 g Intravenous Q8H Alyson Reedy, MD   1 g at 10/16/11 1053  . DISCONTD: DOBUTamine (DOBUTREX) infusion 4000 mcg/mL  2.5-10 mcg/kg/min Intravenous PRN Simonne Martinet, NP   2.5 mcg/kg/min at 10/13/11 1802  . DISCONTD: fat emulsion 20 % infusion 250 mL  250 mL Intravenous Continuous TPN Lennon Alstrom, PHARMD 8 mL/hr at 10/15/11 1720 250 mL at 10/15/11 1720  . DISCONTD: fat emulsion 20 % infusion 250 mL  250 mL Intravenous Continuous TPN Thuy Dien Dang, PHARMD      . DISCONTD: fentaNYL (SUBLIMAZE) injection 12.5-25 mcg  12.5-25 mcg Intravenous Q4H PRN Judie Bonus, MD   25 mcg at 10/15/11 2312  . DISCONTD: heparin ADULT infusion 100 units/mL (25000 units/250 mL)  1,550 Units/hr Intravenous Continuous Burlene Arnt Elder, PHARMD 15.5 mL/hr at 10/15/11 1400 1,550 Units/hr at 10/15/11 1400  . DISCONTD: metroNIDAZOLE (FLAGYL) IVPB 500 mg  500 mg Intravenous Q8H Eduard Clos, MD   500 mg at 10/16/11 0354  . DISCONTD: norepinephrine (LEVOPHED) 4,000 mcg in dextrose 5 % 250 mL infusion  2-30 mcg/min Intravenous PRN Simonne Martinet, NP      . DISCONTD: sodium chloride 0.9 % bolus 500 mL  500 mL Intravenous Q30 min PRN Simonne Martinet, NP      . DISCONTD: tpn solution (CLINIMIX E 5/15) 2,000 mL with insulin regular 45 Units infusion   Intravenous Continuous TPN Lennon Alstrom, PHARMD 80 mL/hr at 10/15/11 1720    . DISCONTD: tpn solution (CLINIMIX E 5/15) 2,000 mL with multivitamins adult 10 mL, trace elements Cr-Cu-Mn-Se-Zn 1 mL, insulin regular 50 Units infusion   Intravenous Continuous TPN Thuy Earlie Raveling, PHARMD      . DISCONTD: vasopressin (PITRESSIN) 50 Units in sodium chloride 0.9 % 250 mL infusion  0.03 Units/min Intravenous Continuous PRN Simonne Martinet, NP         Imaging: No results found.  Significant Diagnostic Studies: CBC Lab Results  Component Value Date   WBC 6.1 10/16/2011   HGB 8.3* 10/16/2011   HCT 24.8* 10/16/2011   MCV 77.0* 10/16/2011   PLT 182 10/16/2011    BMET    Component Value Date/Time   NA 130* 10/16/2011 0450   K 2.8* 10/16/2011 0450   CL 97  10/16/2011 0450   CO2 24 10/16/2011 0450   GLUCOSE 122* 10/16/2011 0450   BUN 11 10/16/2011 0450   CREATININE 0.56 10/16/2011 0450   CALCIUM 8.0* 10/16/2011 0450   GFRNONAA 87* 10/16/2011 0450   GFRAA >90 10/16/2011 0450    COAG Lab Results  Component Value Date   INR 1.47 10/15/2011   INR 1.58* 10/13/2011   INR 1.18 05/12/2011   No results found for this basename: PTT     Physical Examination  Patient Vitals for the past 24 hrs:  BP Temp Temp src Pulse Resp SpO2  Weight  10/16/11 1500 150/80 mmHg - - 101  25  99 % -  10/16/11 1400 138/58 mmHg - - 81  22  96 % -  10/16/11 1300 126/65 mmHg - - 81  22  98 % -  10/16/11 1200 133/67 mmHg - - 85  23  96 % -  10/16/11 1159 - 98.2 F (36.8 C) Oral - - - -  10/16/11 1100 139/71 mmHg - - 89  21  96 % -  10/16/11 1000 139/69 mmHg - - 96  25  97 % -  10/16/11 0900 145/72 mmHg - - 89  22  96 % -  10/16/11 0829 - 98.8 F (37.1 C) Oral - - - -  10/16/11 0800 148/68 mmHg - - 97  20  98 % -  10/16/11 0700 134/71 mmHg - - 89  22  96 % -  10/16/11 0600 125/59 mmHg - - 81  26  98 % -  10/16/11 0500 115/58 mmHg - - 84  11  96 % 179 lb 10.8 oz (81.5 kg)  10/16/11 0405 - 100 F (37.8 C) Oral - - - -  10/16/11 0400 146/67 mmHg - - 91  25  98 % -  10/16/11 0300 140/64 mmHg - - 82  37  99 % -  10/16/11 0200 113/60 mmHg - - 83  25  98 % -  10/16/11 0100 116/52 mmHg - - 82  27  98 % -  10/16/11 0020 - 98.3 F (36.8 C) Oral - - - -  10/16/11 0000 152/71 mmHg - - 84  7  96 % -  10/15/11 2300 121/54 mmHg - - 81  27  96 % -  10/15/11 2200 112/56 mmHg - - 84  26  96 % -  10/15/11 2100 156/70 mmHg - - 87  24  97 % -  10/15/11 2000 151/73 mmHg 100 F (37.8 C) Oral 92  27  99 % -  10/15/11 1900 138/73 mmHg - - 92  26  95 % -  10/15/11 1830 - - - 102  26  97 % -  10/15/11 1800 147/74 mmHg - - 87  44  96 % -  10/15/11 1730 - - - 99  24  97 % -  10/15/11 1700 143/65 mmHg - - 91  22  98 % -  10/15/11 1630 - - - 95  23  97 % -   Pulse Readings from Last 3 Encounters:  10/16/11 101  08/19/11 87  08/07/11 94    General:  Pt awake alert, appears comfortable Gait: Normal HENT: WNL Pulmonary: normal non-labored breathing Cardiac: RRR, Abdomen: soft, NT, no masses Skin: no rashes, ulcers noted Vascular Exam/Pulses:2+ right radial pulse palp. 1+ ulnar pulse palp. Extremities without ischemic changes, no Gangrene ,positive erythema and induration medial antecubital space; tender to touch ; no open wounds;  Musculoskeletal: no muscle wasting or atrophy  Neurologic: A&O X 3; Appropriate Affect ;  SENSATION: normal; MOTOR FUNCTION:  moving all extremities equally.  Speech is fluent/normal  Non-Invasive Vascular Imaging:  Right Upper Extremity Venous Duplex Evaluation  Patient: Mayela, Bullard MR #: [id number] Study Date: 10/14/2011 Room: 2110  SONOGRAPHER Thereasa Parkin, RVT ATTENDING Koren Bound Johny Blamer Reports also to:  ------------------------------------------------------------ History and indications:  History  Diagnostic evaluation.  ------------------------------------------------------------ Study information:  Portable. Study status: Routine. Procedure: A vascular evaluation was performed with the patient in the supine position. Image quality was adequate. The right internal jugular, right subclavian, right axillary, right basilic, right cephalic, right brachial, right radial, right ulnar veins were studied. Right upper extremity venous duplex study. Doppler flow study including B-mode compression maneuvers of all visualized segments, color flow Doppler and selected views of pulsed wave Doppler. Location: MICU Patient status: Inpatient. Venous flow:  +---------------------+----------+-------------------------+ Location Overall Flow properties  +---------------------+----------+-------------------------+ Left internal jugularPatent Phasic; spontaneous;    compressible  +---------------------+----------+-------------------------+ Left subclavian ThrombosedNoncompressible  +---------------------+----------+-------------------------+ Left axillary ThrombosedNoncompressible  +---------------------+----------+-------------------------+ Left brachial ThrombosedNoncompressible  +---------------------+----------+-------------------------+ Left cephalic Patent Compressible   +---------------------+----------+-------------------------+ Left basilic Patent Compressible  +---------------------+----------+-------------------------+ Left radial Patent Compressible  +---------------------+----------+-------------------------+ Left ulnar Patent Compressible  +---------------------+----------+-------------------------+  ------------------------------------------------------------ Summary: Findings consistent with deep vein thrombosis involving the right upper extremity. All other veins patent.  Other specific details can be found in the table(s) above. Prepared and Electronically Authenticated by   ASSESSMENT:  DVT SCV, axillary and brachial  Veins  RUE  Erythema/induration right antecubital space sec infected PICC line  Plan:  Continue IV heparin and Antibiotics  Will review MRI of right arm  No need for drainage of antecubital space at this time  Will continue to follow with you  ROCZNIAK,REGINA J 10/16/2011 5:47 PM   Pt seen and examined history and details as above The right arm has about a 7 cm area of erythema and edema near the antecubital area without fluctuance Ultrasound shows DVT of entire axillary subclavian system.  Could represent septic thrombophlebitis but would continue current management with antibiotics and heparin for now because this process is not limited to a superficial vein.  We would not recommend excision of her deep veins and certainly not her subclavian system and there is no way to tell where the process begins or ends if this is a septic thrombophlebitis.    Will review MRI but if no abscess would continue current care especially in light of the fact that she is getting better overall.  Fabienne Bruns, MD [facility] [place] Office: [phone] Pager: [phone]

## 2011-10-16 NOTE — Progress Notes (Signed)
Clinical Social Worker submitted H&P, med list, and progress note to [facility].  CSW updated SNF on pt's progress, CSW to continue to follow and assist as needed.   Angelia Mould, MSW, [place] [phone]

## 2011-10-17 DIAGNOSIS — R7881 Bacteremia: Secondary | ICD-10-CM

## 2011-10-17 LAB — BASIC METABOLIC PANEL
CO2: 23 mEq/L (ref 19–32)
Calcium: 8.4 mg/dL (ref 8.4–10.5)
Glucose, Bld: 130 mg/dL — ABNORMAL HIGH (ref 70–99)
Potassium: 3.3 mEq/L — ABNORMAL LOW (ref 3.5–5.1)
Sodium: 128 mEq/L — ABNORMAL LOW (ref 135–145)

## 2011-10-17 LAB — CULTURE, BLOOD (ROUTINE X 2)
Culture  Setup Time: 201304160129
Culture  Setup Time: 201304170133

## 2011-10-17 LAB — CBC
Hemoglobin: 9 g/dL — ABNORMAL LOW (ref 12.0–15.0)
MCHC: 33.5 g/dL (ref 30.0–36.0)
RBC: 3.48 MIL/uL — ABNORMAL LOW (ref 3.87–5.11)
WBC: 9.2 10*3/uL (ref 4.0–10.5)

## 2011-10-17 LAB — GLUCOSE, CAPILLARY: Glucose-Capillary: 141 mg/dL — ABNORMAL HIGH (ref 70–99)

## 2011-10-17 LAB — HEPARIN LEVEL (UNFRACTIONATED): Heparin Unfractionated: 0.38 IU/mL (ref 0.30–0.70)

## 2011-10-17 MED ORDER — ALTEPLASE 2 MG IJ SOLR: 2.0000 mg | Freq: Once | INTRAMUSCULAR | Status: DC

## 2011-10-17 MED ORDER — INSULIN REGULAR HUMAN 100 UNIT/ML IJ SOLN
INTRAVENOUS | Status: AC
  Administered 2011-10-17: 21:00:00 via INTRAVENOUS
  Filled 2011-10-17: qty 2000

## 2011-10-17 MED ORDER — FAT EMULSION 20 % IV EMUL
250.0000 mL | INTRAVENOUS | Status: AC
  Administered 2011-10-17: 250 mL via INTRAVENOUS
  Filled 2011-10-17: qty 250

## 2011-10-17 MED ORDER — POTASSIUM CHLORIDE 10 MEQ/50ML IV SOLN
10.0000 meq | INTRAVENOUS | Status: AC
  Administered 2011-10-17 (×5): 10 meq via INTRAVENOUS
  Filled 2011-10-17 (×5): qty 50

## 2011-10-17 NOTE — Progress Notes (Signed)
ID PROGRESS NOTE 78 year old female with PMH perforated duodenal ulcer, status post antrectomy, duodenal resection, Billroth 2 reconstruction, omental patching of Duodenal bulb, open cholecystectomy, gastrostomy and jejunostomy on 05/07/11. complicated by epigastric abscess s/p percutaneous drain 05/31/11; malnutrition and short-gut syndrome on chronic TPN& hx of C diff 07/2011. She presented on 4/15 w/ fever, hypotension, AMS and purulent discharge from PICC insertion site, abd pain and UA concerning for UTI vs. Sepsis from PICC line infection  Subjective: Afebrile, denies fever, chills, nightsweats, no nausea, vomiting, no abdominal pain  24hr events-> evaluated by vascular surgery, felt that imaging consistent with superficial clot and that no surgical intervention needed at this time, to continue with antibiotics and anticoagulation with heparin   Abtx: vanco and gent  Has received ctx, and cefepime adn carbapenems during this hospitalization, tolerated without difficulty, despite pcn allergy Objective: Vital signs in last 24 hours: Temp:  [98.2 F (36.8 C)-99.5 F (37.5 C)] 98.7 F (37.1 C) (04/20 0600) Pulse Rate:  [81-113] 100  (04/20 0600) Resp:  [18-33] 22  (04/20 0600) BP: (126-178)/(58-120) 154/78 mmHg (04/20 0600) SpO2:  [93 %-100 %] 94 % (04/20 0600)  Gen= elderly female in NAD , A x O by 3 HEENT= op clear, [place]/AT, perrla, eomi, no scleral icterus, pale conjunctiva Neck = supple, no lad Pulm= ctab in ant lung fields, no w/c/r Cors= nl s1 ,s2, no g/m/r Abd= ntnd, bs slow, peg nontender Ext= trace edema  Lab Results  [facility] 10/17/11 0858 10/16/11 0450 10/15/11 2055  WBC 9.2 6.1 --  HGB 9.0* 8.3* --  HCT 26.9* 24.8* --  NA -- 130* 129*  K -- 2.8* 2.8*  CL -- 97 96  CO2 -- 24 23  BUN -- 11 9  CREATININE -- 0.56 0.53  GLU -- -- --   Liver Panel  Basename 10/15/11 0500  PROT 5.7*  ALBUMIN 1.9*  AST 20  ALT 40*  ALKPHOS 119*  BILITOT 0.4  BILIDIR --  IBILI  --    Microbiology: 4/15 urine cx E.coli (ctx S, bactrim S)  4/15 perc blood x 2/2 CoNS and strep 4/16 (am) cath tip CoNS 4/16 (PM) perc blood x 2/2 CoNS 4/20 perc blood 2/2 pending  Studies/Results: Ct Humerus Right W Contrast  10/16/2011  *RADIOLOGY REPORT*  Clinical Data: Upper extremity venous thrombosis.  Redness about the elbow.  Pain.  CT OF THE RIGHT HUMERUS WITH CONTRAST  Technique:  Multidetector CT imaging was performed following the standard protocol during bolus administration of intravenous contrast.  Contrast: 80mL OMNIPAQUE IOHEXOL 300 MG/ML  SOLN  Comparison: Plain films of the elbow 12/09/2007. Report of upper extremity ultrasound 10/14/2011 reviewed.  Findings: As described report of comparison ultrasound, clot is seen in the left subclavian, axillary and brachial veins. Subclavian vein is not well demonstrated on this examination as it is not scanned in its entirety.  Clot is well demonstrated extending to a point 18 cm below the top of the humeral head. Extensive subcutaneous edema is present.  There is no focal fluid collection to suggest abscess.  No focal bony abnormality is identified.  There is some dependent atelectasis in the right lung. Imaged intra-abdominal contents are unremarkable.  IMPRESSION: Clot in the left subclavian, axillary and brachial veins as described above with associated subcutaneous edema.  Negative for abscess.  Original Report Authenticated By: Bernadene Bell. D'ALESSIO, M.D.   4/19 TTE - negative for vegetation  Assessment/Plan: 78 yo Female presented with sepsis found to have CoNS  bacteremia, persisting after PICC line removal which is concerning for endovascular infection, from septic thrombophlebitis vs possible endocarditis  1) will follow up on repeat blood cultures since we have not documented clearance of her bacteremia  2) will discontinue gentamicin, she only needs to be on vancomycin, goal 15-20. For CoNS infection  3) leukocytosis= wbc  increased from 6 to 9 today. Will add on differential to see if having left shift  Will continue to follow to provide more recs tomorrow.   LOS: 5 days    Judyann Munson 10/17/2011, 10:17 AM

## 2011-10-17 NOTE — Progress Notes (Signed)
Unable to flush brown port (proximal) on central line.  Pt. Has one other port for heparin and the other for potential TNA.  Pt. Is in need of IV antibiotics which are not compitable with either running IV.  Unable to TPA line due to it's placement (it is a central line).  Dr. Sharl Ma notified.  Will hold TNA for tonight secondary to need for that port for IV antibiotics and lab draws.  Also, IV therapy attempted several times unsuccessfully to place a peripheral site for the heparin, thus avoiding needless peripheral blood draws for heparin levels.  MD informed nursing that PICC line will be ordered for the following day in the am.  TNA and lipids to be on hold for now.

## 2011-10-17 NOTE — Progress Notes (Signed)
Brown port clotted to left IJ central line.  Heparin infusing to one port and antibiotics infusing to other port.  Attempted x2 to start peripheral IV using ultrasound but unable. Unable to hang TNA due to no available port. Staff RN to notify physician

## 2011-10-17 NOTE — Progress Notes (Signed)
[facility]  Subjective  - Arm still sore but better, complains of abdominal pain  Objective 154/78 100 98.7 F (37.1 C) (Oral) 22 94%  Intake/Output Summary (Last 24 hours) at 10/17/11 0830 Last data filed at 10/16/11 2300  Gross per 24 hour  Intake 2307.5 ml  Output   1300 ml  Net 1007.5 ml   Right arm with persistant but improved erythema/edema Abdomen soft mild left side pain with palpation, no rebound  Data: CT arm reviewed, tissue edema, no abscess, right axillary subclavian DVT WBC 6 and improved from 11 on 4/16  Assessment/Planning: Cellulitis right arm, with DVT No Abscess  Continue heparin and IV antibiotics Reconsult if clinical condition deteriorates  [name] 10/17/2011 8:30 AM --  Laboratory Lab Results:  Basename 10/16/11 0450 10/15/11 0500  WBC 6.1 7.8  HGB 8.3* 8.5*  HCT 24.8* 24.9*  PLT 182 167   BMET  Basename 10/16/11 0450 10/15/11 2055  NA 130* 129*  K 2.8* 2.8*  CL 97 96  CO2 24 23  GLUCOSE 122* 159*  BUN 11 9  CREATININE 0.56 0.53  CALCIUM 8.0* 8.4    COAG Lab Results  Component Value Date   INR 1.47 10/15/2011   INR 1.58* 10/13/2011   INR 1.18 05/12/2011   No results found for this basename: PTT    Antibiotics Anti-infectives     Start     Dose/Rate Route Frequency Ordered Stop   10/16/11 1500   gentamicin (GARAMYCIN) 70 mg in dextrose 5 % 50 mL IVPB        70 mg 103.5 mL/hr over 30 Minutes Intravenous Every 8 hours 10/16/11 1425     10/15/11 1400   vancomycin (VANCOCIN) IVPB 1000 mg/200 mL premix        1,000 mg 200 mL/hr over 60 Minutes Intravenous Every 8 hours 10/15/11 0634     10/13/11 2200   aztreonam (AZACTAM) 1 g in dextrose 5 % 50 mL IVPB  Status:  Discontinued        1 g 100 mL/hr over 30 Minutes Intravenous 3 times per day 10/13/11 1619 10/13/11 1639   10/13/11 1800   cefTAZidime (FORTAZ) 1 g in dextrose 5 % 50 mL IVPB  Status:  Discontinued        1 g 100  mL/hr over 30 Minutes Intravenous Every 8 hours 10/13/11 1640 10/16/11 1201   10/13/11 1615   aztreonam (AZACTAM) 2 g in dextrose 5 % 50 mL IVPB  Status:  Discontinued        2 g 100 mL/hr over 30 Minutes Intravenous  Once 10/13/11 1611 10/13/11 1612   10/13/11 1615   vancomycin (VANCOCIN) IVPB 1000 mg/200 mL premix  Status:  Discontinued        1,000 mg 200 mL/hr over 60 Minutes Intravenous  Once 10/13/11 1611 10/13/11 1612   10/13/11 1615   metroNIDAZOLE (FLAGYL) IVPB 500 mg  Status:  Discontinued        500 mg 100 mL/hr over 60 Minutes Intravenous  Once 10/13/11 1611 10/13/11 1612   10/13/11 1430   Ampicillin-Sulbactam (UNASYN) 3 g in sodium chloride 0.9 % 100 mL IVPB  Status:  Discontinued        3 g 100 mL/hr over 60 Minutes Intravenous STAT 10/13/11 1417 10/13/11 1444   10/13/11 0600   vancomycin (VANCOCIN) 1,250 mg in sodium chloride 0.9 % 250 mL IVPB  Status:  Discontinued  1,250 mg 166.7 mL/hr over 90 Minutes Intravenous Every 12 hours 10/13/11 0457 10/15/11 0632   10/13/11 0600   aztreonam (AZACTAM) 1 g in dextrose 5 % 50 mL IVPB  Status:  Discontinued        1 g 100 mL/hr over 30 Minutes Intravenous 3 times per day 10/13/11 0507 10/13/11 1526   10/13/11 0400   metroNIDAZOLE (FLAGYL) IVPB 500 mg  Status:  Discontinued        500 mg 100 mL/hr over 60 Minutes Intravenous Every 8 hours 10/13/11 0323 10/16/11 1201   10/12/11 1915   vancomycin (VANCOCIN) IVPB 1000 mg/200 mL premix        1,000 mg 200 mL/hr over 60 Minutes Intravenous  Once 10/12/11 1901 10/12/11 2227   10/12/11 1915   ceFEPIme (MAXIPIME) 2 g in dextrose 5 % 50 mL IVPB        2 g 100 mL/hr over 30 Minutes Intravenous To Minor Emergency Dept 10/12/11 1901 10/12/11 2105

## 2011-10-17 NOTE — Progress Notes (Signed)
Subjective: Patient seen and examined, she has history of perforated duodenal ulcer, status post antrectomy, duodenal resection, Billroth 2 reconstruction, omental patching of Duodenal bulb, open cholecystectomy, gastrostomy and jejunostomy on 05/07/11. Her course was complicated by epigastric abscess 05/31/11. 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 Brought to the ER on 4/15 w/ fever and purulent discharge from PICC insertion site, abd pain and UA concerning for UTI. She was started on broad spectrum abx, ID has been consulted. Blood cultures grew coagulase negative staph , she also had e coli growing from urine. At this time the right picc has been removed and TNA is off for three days, plan is to reinsert Picc in left arm and restart TNA at discharge   Objective: Vital signs in last 24 hours: Temp:  [98.2 F (36.8 C)-99.5 F (37.5 C)] 98.7 F (37.1 C) (04/20 0600) Pulse Rate:  [81-113] 100  (04/20 0600) Resp:  [18-33] 22  (04/20 0600) BP: (126-178)/(58-120) 154/78 mmHg (04/20 0600) SpO2:  [93 %-100 %] 94 % (04/20 0600) Weight change:  Last BM Date: 10/15/11  Intake/Output from previous day: 04/19 0701 - 04/20 0700 In: 2624 [I.V.:406; IV Piggyback:1250; TPN:968] Out: 1400 [Urine:1400]     Physical Exam: HEENT: Atraumatic, normocephalic Neck: Supple Chest : Clear to auscultation bilaterally, no wheezing, no crackles Heart : S1 S2 Regular, no murmurs Abdomen: Soft, Nontender, no organomegaly, peg tube is in place with suction Ext : No cyanosis, clubbing, edema   Lab Results: Basic Metabolic Panel:  Basename 10/16/11 0450 10/15/11 2055 10/15/11 0500  NA 130* 129* --  K 2.8* 2.8* --  CL 97 96 --  CO2 24 23 --  GLUCOSE 122* 159* --  BUN 11 9 --  CREATININE 0.56 0.53 --  CALCIUM 8.0* 8.4 --  MG 2.0 2.6* --  PHOS -- 3.1 3.2   Liver Function Tests:  Basename 10/15/11 0500  AST 20  ALT 40*  ALKPHOS 119*  BILITOT  0.4  PROT 5.7*  ALBUMIN 1.9*    Basename 10/17/11 0858 10/16/11 0450 10/15/11 0500  WBC 9.2 6.1 --  NEUTROABS -- -- 6.2  HGB 9.0* 8.3* --  HCT 26.9* 24.8* --  MCV 77.3* 77.0* --  PLT 215 182 --   CBG:  Basename 10/17/11 0823 10/17/11 0342 10/17/11 0002 10/16/11 2008 10/16/11 1743 10/16/11 1154  GLUCAP 145* 127* 141* 84 162* 162*   Hemoglobin A1C: No results found for this basename: HGBA1C in the last 72 hours Fasting Lipid Panel:  Basename 10/15/11 0500  CHOL 79  HDL --  LDLCALC --  TRIG 89  CHOLHDL --  LDLDIRECT --   Thyroid Function Tests: No results found for this basename: TSH,T4TOTAL,FREET4,T3FREE,THYROIDAB in the last 72 hours Anemia Panel: No results found for this basename: VITAMINB12,FOLATE,FERRITIN,TIBC,IRON,RETICCTPCT in the last 72 hours Coagulation:  Basename 10/15/11 0215  LABPROT 18.1*  INR 1.47   Urine Drug Screen: Drugs of Abuse  No results found for this basename: labopia, cocainscrnur, labbenz, amphetmu, thcu, labbarb    Alcohol Level: No results found for this basename: ETH:2 in the last 72 hours Urinalysis: No results found for this basename: COLORURINE:2,APPERANCEUR:2,LABSPEC:2,PHURINE:2,GLUCOSEU:2,HGBUR:2,BILIRUBINUR:2,KETONESUR:2,PROTEINUR:2,UROBILINOGEN:2,NITRITE:2,LEUKOCYTESUR:2 in the last 72 hours Misc. Labs:  Recent Results (from the past 240 hour(s))  URINE CULTURE     Status: Normal   Collection Time   10/12/11  7:52 PM      Component Value Range Status Comment   Specimen Description URINE, CATHETERIZED   Final  Special Requests ADDED 10/12/11 2017   Final    Culture  Setup Time [id number]   Final    Colony Count >=100,000 COLONIES/ML   Final    Culture ESCHERICHIA COLI   Final    Report Status 10/15/2011 FINAL   Final    Organism ID, Bacteria ESCHERICHIA COLI   Final   CULTURE, BLOOD (ROUTINE X 2)     Status: Normal (Preliminary result)   Collection Time   10/12/11  8:00 PM      Component Value Range Status Comment    Specimen Description BLOOD HAND LEFT   Final    Special Requests     Final    Value: BOTTLES DRAWN AEROBIC AND ANAEROBIC AERO 10CC, ANAE 5CC   Culture  Setup Time     Final    Value: [id number] This organism DOES NOT demonstrate inducible Clindamycin resistance in vitro.   Culture     Final    Value: STREPTOCOCCUS SPECIES     STAPHYLOCOCCUS SPECIES (COAGULASE NEGATIVE)     Note: Gram Stain Report Called to,Read Back By and Verified With: CHRISTY FRALEY @1132  10/13/11 BY KRAWS   Report Status PENDING   Incomplete    Organism ID, Bacteria STAPHYLOCOCCUS SPECIES (COAGULASE NEGATIVE)   Final   CULTURE, BLOOD (ROUTINE X 2)     Status: Normal (Preliminary result)   Collection Time   10/12/11  8:13 PM      Component Value Range Status Comment   Specimen Description BLOOD HAND RIGHT   Final    Special Requests BOTTLES DRAWN AEROBIC ONLY 10CC   Final    Culture  Setup Time [id number]   Final    Culture     Final    Value: STREPTOCOCCUS SPECIES     STAPHYLOCOCCUS SPECIES (COAGULASE NEGATIVE)     Note: Gram Stain Report Called to,Read Back By and Verified With: LAUREN CONNALLY @ 1828 ON 10/13/11 BY GOLLD   Report Status PENDING   Incomplete   CATH TIP CULTURE     Status: Normal   Collection Time   10/13/11  3:47 AM      Component Value Range Status Comment   Specimen Description CATH TIP   Final    Special Requests NONE   Final    Culture     Final    Value: >100 COLONIES STAPHYLOCOCCUS SPECIES (COAGULASE NEGATIVE)     Note: RIFAMPIN AND GENTAMICIN SHOULD NOT BE USED AS SINGLE DRUGS FOR TREATMENT OF STAPH INFECTIONS. This organism DOES NOT demonstrate inducible Clindamycin resistance in vitro.   Report Status 10/16/2011 FINAL   Final    Organism ID, Bacteria STAPHYLOCOCCUS SPECIES (COAGULASE NEGATIVE)   Final   MRSA PCR SCREENING     Status: Normal   Collection Time   10/13/11  3:51 AM      Component Value Range Status Comment   MRSA by PCR NEGATIVE  NEGATIVE  Final   MRSA PCR SCREENING      Status: Normal   Collection Time   10/13/11  3:35 PM      Component Value Range Status Comment   MRSA by PCR NEGATIVE  NEGATIVE  Final   CULTURE, BLOOD (ROUTINE X 2)     Status: Normal (Preliminary result)   Collection Time   10/13/11  5:00 PM      Component Value Range Status Comment   Specimen Description BLOOD RIGHT ARM   Final    Special Requests BOTTLES DRAWN AEROBIC ONLY 10CC  Final    Culture  Setup Time [id number]   Final    Culture     Final    Value: GRAM POSITIVE COCCI IN CLUSTERS     Note: Gram Stain Report Called to,Read Back By and Verified With: MARK SIVNGE @0436  ON 10/16/11 BY MCLET   Report Status PENDING   Incomplete   CULTURE, BLOOD (ROUTINE X 2)     Status: Normal   Collection Time   10/13/11  5:08 PM      Component Value Range Status Comment   Specimen Description BLOOD RIGHT HAND   Final    Special Requests BOTTLES DRAWN AEROBIC ONLY 5CC   Final    Culture  Setup Time [id number]   Final    Culture     Final    Value: STAPHYLOCOCCUS SPECIES (COAGULASE NEGATIVE)     Note: SUSCEPTIBILITIES PERFORMED ON PREVIOUS CULTURE WITHIN THE LAST 5 DAYS.     Note: Gram Stain Report Called to,Read Back By and Verified With: ASHLEY HARRISON @0627  ON 10/15/11 BY MCLET   Report Status 10/16/2011 FINAL   Final   URINE CULTURE     Status: Normal   Collection Time   10/13/11  5:12 PM      Component Value Range Status Comment   Specimen Description URINE, CATHETERIZED   Final    Special Requests NONE   Final    Culture  Setup Time [id number]   Final    Colony Count NO GROWTH   Final    Culture NO GROWTH   Final    Report Status 10/14/2011 FINAL   Final     Studies/Results: Ct Humerus Right W Contrast  10/16/2011  *RADIOLOGY REPORT*  Clinical Data: Upper extremity venous thrombosis.  Redness about the elbow.  Pain.  CT OF THE RIGHT HUMERUS WITH CONTRAST  Technique:  Multidetector CT imaging was performed following the standard protocol during bolus administration of  intravenous contrast.  Contrast: 80mL OMNIPAQUE IOHEXOL 300 MG/ML  SOLN  Comparison: Plain films of the elbow 12/09/2007. Report of upper extremity ultrasound 10/14/2011 reviewed.  Findings: As described report of comparison ultrasound, clot is seen in the left subclavian, axillary and brachial veins. Subclavian vein is not well demonstrated on this examination as it is not scanned in its entirety.  Clot is well demonstrated extending to a point 18 cm below the top of the humeral head. Extensive subcutaneous edema is present.  There is no focal fluid collection to suggest abscess.  No focal bony abnormality is identified.  There is some dependent atelectasis in the right lung. Imaged intra-abdominal contents are unremarkable.  IMPRESSION: Clot in the left subclavian, axillary and brachial veins as described above with associated subcutaneous edema.  Negative for abscess.  Original Report Authenticated By: Bernadene Bell. Maricela Curet, M.D.    Medications: Scheduled Meds:   . insulin aspart  0-9 Units Subcutaneous Q4H  . pantoprazole (PROTONIX) IV  40 mg Intravenous Q24H  . potassium chloride  10 mEq Intravenous Q1 Hr x 6  . vancomycin  1,000 mg Intravenous Q8H  . DISCONTD: cefTAZidime (FORTAZ)  IV  1 g Intravenous Q8H  . DISCONTD: gentamicin  70 mg Intravenous Q8H  . DISCONTD: metronidazole  500 mg Intravenous Q8H   Continuous Infusions:   . sodium chloride 20 mL/hr at 10/14/11 1113  . heparin 1,850 Units/hr (10/16/11 1950)  . DISCONTD: sodium chloride 20 mL/hr at 10/14/11 1100  . DISCONTD: fat emulsion 250 mL (10/15/11 1720)  . DISCONTD: fat  emulsion    . DISCONTD: TPN (CLINIMIX) +/- additives 80 mL/hr at 10/15/11 1720  . DISCONTD: TPN (CLINIMIX) +/- additives    . DISCONTD: vasopressin (PITRESSIN) infusion - *FOR SHOCK*     PRN Meds:.acetaminophen, acetaminophen, fentaNYL, iohexol, ondansetron (ZOFRAN) IV, ondansetron (ZOFRAN) IV, ondansetron, DISCONTD: DOBUTamine, DISCONTD: norepinephrine  (LEVOPHED) Adult infusion, DISCONTD: sodium chloride, DISCONTD: vasopressin (PITRESSIN) infusion - *FOR SHOCK*  Assessment/Plan:   Sepsis Resolved. Continue vancomycin for coag neg staph   DIABETES MELLITUS, TYPE II Cont SSI  Hyponatremia Sec to GI losses. Repeat BMP today.  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 At this time the right picc has been removed and TNA is off for three days, plan is to reinsert Picc in left arm and restart TNA at discharge  DVT of  Right upper extremity Cont IV Heparin     UTI (lower urinary tract infection) Most likely colonization as per ID  Bacteremia due to gram positive rods and cocci Sec to infected picc line. Cont Vancomycin ID following  H/O C diff Treated in February. No diarrhea at this time  Hypokalemia: Will check potassium today and replace as needed.   Dementia; Pleasantly confused.    LOS: 5 days   [facility] Pager: [phone] 10/17/2011, 10:16 AM

## 2011-10-17 NOTE — Progress Notes (Signed)
ANTICOAGULATION/ANTIBIOTIC CONSULT NOTE - Follow Up Consult  Pharmacy Consult for vancomycin/heparin Indication: septic thrombophlebitis, r/o endocarditis/  DVT at site of PICC  Allergies  Allergen Reactions  . Other Swelling    Pinto beans cause swelling and hives  . Penicillins Swelling    Patient Measurements: Height: 5\' 5"  (165.1 cm) Weight: 179 lb 10.8 oz (81.5 kg) IBW/kg (Calculated) : 57  Heparin Dosing Weight: 75kg  Vital Signs: Temp: 99.8 F (37.7 C) (04/20 1300) Temp src: Oral (04/20 1300) BP: 149/83 mmHg (04/20 1300) Pulse Rate: 93  (04/20 1300)  Labs:  Basename 10/17/11 0858 10/16/11 0450 10/15/11 2055 10/15/11 0500 10/15/11 0215  HGB 9.0* 8.3* -- -- --  HCT 26.9* 24.8* -- 24.9* --  PLT 215 182 -- 167 --  APTT -- -- -- -- --  LABPROT -- -- -- -- 18.1*  INR -- -- -- -- 1.47  HEPARINUNFRC 0.38 0.33 0.23* -- --  CREATININE 0.49* 0.56 0.53 -- --  CKTOTAL -- -- -- -- --  CKMB -- -- -- -- --  TROPONINI -- -- -- -- --   Estimated Creatinine Clearance: 61.1 ml/min (by C-G formula based on Cr of 0.49).   Medications:  Scheduled:    . insulin aspart  0-9 Units Subcutaneous Q4H  . pantoprazole (PROTONIX) IV  40 mg Intravenous Q24H  . potassium chloride  10 mEq Intravenous Q1 Hr x 6  . potassium chloride  10 mEq Intravenous Q1 Hr x 5  . vancomycin  1,000 mg Intravenous Q8H  . DISCONTD: gentamicin  70 mg Intravenous Q8H    Assessment: 78 yo female with new upper extremity DVT on doppler. Patient is on long-term TPN and developed Septic thrombophlebitis at PICC site; INR on 04/16 1.58 (not on coumadin PTA). Heparin level therapeutic but at lower end of goal, CBC is stable. Patient also on vancomycin with coag negative staph as well as gram positive cocci in clusters found in blood cultures. Gentamicin stopped today. ID and CCM teams concerned for potential endocarditis  Goal of Therapy:  Heparin level 0.3-0.7 units/ml Vancomycin trough 15-95mcg/ml   Plan:    -Continue heparin at current rate -Currently on vancomycin 1gm IV q8h, dose increased for trough of 58mcg/ml and trough recheck with new dose was 17mcg/ml.  I am concerned for drug accumulation in this 78 yo.  Check trough with am labs  Dannielle Huh 10/17/2011,3:02 PM

## 2011-10-17 NOTE — Progress Notes (Addendum)
Lab unable to obtain access for lab draw. MD notified. Order for labs to be drawn via central line. IV team notified to come to draw labs. IV nurse informed RN that they could not get Blood cultures and heparin level from central line. No labs drawn by IV team. Laboratory called to inform that they needed to send another phlebotomist. Confirmed to RN that someone would come. Day shift notified.

## 2011-10-17 NOTE — Progress Notes (Signed)
PARENTERAL NUTRITION CONSULT NOTE - FOLLOW UP  Pharmacy Consult for TPN Indication: chronic TPN at home for short - gut syndrome  Allergies  Allergen Reactions  . Other Swelling    Pinto beans cause swelling and hives  . Penicillins Swelling    Patient Measurements: Height: 5\' 5"  (165.1 cm) Weight: 179 lb 10.8 oz (81.5 kg) IBW/kg (Calculated) : 57  Adjusted Body Weight:  Usual Weight: 162 lbs  Vital Signs: Temp: 98.7 F (37.1 C) (04/20 0600) Temp src: Oral (04/20 0600) BP: 154/78 mmHg (04/20 0600) Pulse Rate: 100  (04/20 0600) Intake/Output from previous day: 04/19 0701 - 04/20 0700 In: 2624 [I.V.:406; IV Piggyback:1250; TPN:968] Out: 1400 [Urine:1400] Intake/Output from this shift:    Labs:  [facility] 10/16/11 0450 10/15/11 0500 10/15/11 0215 10/14/11 1030  WBC 6.1 7.8 -- 8.8  HGB 8.3* 8.5* -- 8.5*  HCT 24.8* 24.9* -- 25.3*  PLT 182 167 -- 154  APTT -- -- -- --  INR -- -- 1.47 --     Basename 10/16/11 0450 10/15/11 2055 10/15/11 0500 10/14/11 1100  NA 130* 129* 129* --  K 2.8* 2.8* 2.4* --  CL 97 96 98 --  CO2 24 23 22  --  GLUCOSE 122* 159* 170* --  BUN 11 9 10  --  CREATININE 0.56 0.53 0.50 --  LABCREA -- -- -- --  CREAT24HRUR -- -- -- --  CALCIUM 8.0* 8.4 8.2* --  MG 2.0 2.6* 1.3* --  PHOS -- 3.1 3.2 2.8  PROT -- -- 5.7* --  ALBUMIN -- -- 1.9* --  AST -- -- 20 --  ALT -- -- 40* --  ALKPHOS -- -- 119* --  BILITOT -- -- 0.4 --  BILIDIR -- -- -- --  IBILI -- -- -- --  PREALBUMIN -- -- 5.0* --  TRIG -- -- 89 --  CHOLHDL -- -- -- --  CHOL -- -- 79 --   Estimated Creatinine Clearance: 61.1 ml/min (by C-G formula based on Cr of 0.56).    Basename 10/17/11 0342 10/17/11 0002 10/16/11 2008  GLUCAP 127* 141* 84    Medical History: Past Medical History  Diagnosis Date  . Hypertension   . Reflux   . Right elbow pain     OTIF  . Dementia   . Depression   . Osteoarthritis   . Pancreatitis 11/2007    HOP  . Angiomyolipoma of kidney     right    . Ulcerative esophagitis 12/05/2007    hx elevated gastrin, severe on EGD by Dr Jena Gauss , h pylori negative  . Hiatal hernia   . S/P colonoscopy 2009    pt reports normal by Dr Lovell Sheehan  . Diabetes mellitus   . Anemia   . Hyponatremia   . Cellulitis   . Esophagitis   . Peptic ulcer   . Insomnia     Medications:  Prescriptions prior to admission  Medication Sig Dispense Refill  . acetaminophen (TYLENOL) 500 MG tablet Take 500 mg by mouth daily as needed. For pain      . amLODipine (NORVASC) 5 MG tablet Take 5 mg by mouth daily.      . benazepril (LOTENSIN) 20 MG tablet Take 20 mg by mouth daily.       . butalbital-acetaminophen-caffeine (FIORICET, ESGIC) 50-325-40 MG per tablet Take 2 tablets by mouth every 8 (eight) hours as needed. For headache      . calcium-vitamin D (OSCAL WITH D) 500-200 MG-UNIT per tablet Take 1 tablet  by mouth daily.       . cloNIDine (CATAPRES - DOSED IN MG/24 HR) 0.1 mg/24hr patch Place 1 patch onto the skin once a week. Change on Mondays      . darifenacin (ENABLEX) 15 MG 24 hr tablet Take 15 mg by mouth daily.       . ergocalciferol (VITAMIN D2) 50000 UNITS capsule Take 50,000 Units by mouth once a week.      . insulin aspart (NOVOLOG) 100 UNIT/ML injection Inject 3 Units into the skin 3 (three) times daily before meals. Sliding scale-give 3 units sq for CBG >150      . insulin regular (NOVOLIN R,HUMULIN R) 100 units/mL injection Inject 65 Units into the skin daily.      . Loperamide HCl (IMODIUM PO) Take 2 mg by mouth every 4 (four) hours as needed. For diarrhea      . metFORMIN (GLUCOPHAGE) 1000 MG tablet Take 1,000 mg by mouth daily with breakfast.       . metoprolol tartrate (LOPRESSOR) 12.5 mg TABS Take 12.5 mg by mouth 2 (two) times daily.      . Multiple Vitamin (MULITIVITAMIN WITH MINERALS) TABS Take 1 tablet by mouth daily.      . ondansetron (ZOFRAN) 4 MG tablet Take 4 mg by mouth every 8 (eight) hours as needed. For nausea       . oxyCODONE  (ROXICODONE) 5 MG/5ML solution Take 5 mg by mouth every 4 (four) hours as needed. For pain      . pantoprazole (PROTONIX) 40 MG tablet Take 40 mg by mouth 2 (two) times daily before a meal.      . PRESCRIPTION MEDICATION Inject into the vein continuous. Home TPN.  Patient gets supply from [facility] [id number]).      . QUEtiapine (SEROQUEL) 25 MG tablet Take 25 mg by mouth at bedtime.       . traZODone (DESYREL) 50 MG tablet Take 25-50 mg by mouth at bedtime as needed. For sleep        Insulin Requirements in the past 24 hours:  6 units Novolog SSI and 50 unit regular insulin in TPN  Current Nutrition: Ice chips   Nutritional Goals:  1650-1850 kCal, 85-100 grams of protein per day   Assessment:  [name] admitted from [facility] where she is on chronic TPN and takes POs only for pleasure.   Admit: abd pain and fever/purulent discharge from PICC line GI: erosive esophagitis with perf pyloric ulcer s/p ex-lap 05/06/11, s/p gastrostomy, gastrectomy, cholecystectomy, 11/26 jejunal-ileal fistula, gastroileostomy resulting in short gut, PEG to suction, on ice chipss Endo: hx DM - CBGs 84-145, adequately controlled on SSI and insulin in TPN (was on metformin + 65 units insulin daily PTA) Lytes: Na=128, K=3.3, hyponatremia - unable to correct with pre-made Clinimix (hyponatremic on previous admits with Clinimix), others WNL Renal: SCr stable, good UOP, I/O per 24h=+1.3L, NS at [facility] Pulm: 98% on RA Cards: hx HTN - BP mildly elevated, HR stable, off pressors.  Heparin gtt for new DVT Hepatobil: alk phos/ALT mildly elevated, AST/tbili WNL - monitor Neuro: hx depression (seroquel / trazodone PTA), some confusion ID:  Vanc D#5/Gent D#1 for septic thrombophlebitis (r/o IE) and CRBSI with CoNS, E.coli UTI, PICC line replaced, afebrile today, WBC WNL Best Practices: PPI IV, heparin gtt   Plan:  Continue Clinimix E5/15 at 80 ml/hr with lipids at 8 ml/hr, to closely match home formula and  meeting 100% of patient's needs  Spoke with Dr. Sharl Ma to clarify if TPN to be orders as order for TPN stopped yesterday, TPN is to be re-ordered. Patient has CVC (triple lumen) - MVI and TE MWF only due to national shortage  - Monitor I/O's, lytes  -replace K+ 5 runs central line KCl -labs in am  Juliette Alcide, PharmD, BCPS.  Pager: [phone] 10/17/2011, 8:31 AM

## 2011-10-18 LAB — BASIC METABOLIC PANEL
Calcium: 8.5 mg/dL (ref 8.4–10.5)
Creatinine, Ser: 0.53 mg/dL (ref 0.50–1.10)
GFR calc Af Amer: >90 mL/min (ref >90)

## 2011-10-18 LAB — CBC
MCH: 26.1 pg (ref 26.0–34.0)
MCHC: 32.9 g/dL (ref 30.0–36.0)
MCV: 79.2 fL (ref 78.0–100.0)
Platelets: 269 10*3/uL (ref 150–400)
RDW: 17.4 % — ABNORMAL HIGH (ref 11.5–15.5)

## 2011-10-18 LAB — GLUCOSE, CAPILLARY
Glucose-Capillary: 149 mg/dL — ABNORMAL HIGH (ref 70–99)
Glucose-Capillary: 143 mg/dL — ABNORMAL HIGH (ref 70–99)
Glucose-Capillary: 136 mg/dL — ABNORMAL HIGH (ref 70–99)
Glucose-Capillary: 172 mg/dL — ABNORMAL HIGH (ref 70–99)

## 2011-10-18 LAB — MAGNESIUM: Magnesium: 1.8 mg/dL (ref 1.5–2.5)

## 2011-10-18 MED ORDER — FENTANYL 25 MCG/HR TD PT72
25.0000 ug | MEDICATED_PATCH | Freq: Once | TRANSDERMAL | Status: DC
  Administered 2011-10-18: 25 ug via TRANSDERMAL

## 2011-10-18 MED ORDER — INSULIN REGULAR HUMAN 100 UNIT/ML IJ SOLN
INTRAVENOUS | Status: AC
  Administered 2011-10-18: 18:00:00 via INTRAVENOUS
  Filled 2011-10-18: qty 2000

## 2011-10-18 MED ORDER — FAT EMULSION 20 % IV EMUL
250.0000 mL | INTRAVENOUS | Status: AC
  Administered 2011-10-18: 250 mL via INTRAVENOUS
  Filled 2011-10-18: qty 250

## 2011-10-18 MED ORDER — SODIUM CHLORIDE 0.9 % IJ SOLN
10.0000 mL | Freq: Two times a day (BID) | INTRAMUSCULAR | Status: DC
  Administered 2011-10-27: 3 mL

## 2011-10-18 MED ORDER — WHITE PETROLATUM GEL
Status: AC
  Administered 2011-10-18: 16:00:00
  Filled 2011-10-18: qty 5

## 2011-10-18 MED ORDER — SODIUM CHLORIDE 0.9 % IJ SOLN
10.0000 mL | INTRAMUSCULAR | Status: DC | PRN
  Administered 2011-10-22: 3 mL
  Administered 2011-10-22: 10 mL
  Administered 2011-10-22: 3 mL
  Administered 2011-10-23 – 2011-10-27 (×12): 10 mL

## 2011-10-18 MED ORDER — VANCOMYCIN HCL 1000 MG IV SOLR
1250.0000 mg | Freq: Two times a day (BID) | INTRAVENOUS | Status: DC
  Administered 2011-10-18 – 2011-10-27 (×18): 1250 mg via INTRAVENOUS
  Filled 2011-10-18 (×19): qty 1250

## 2011-10-18 MED ORDER — FENTANYL 25 MCG/HR TD PT72
25.0000 ug | MEDICATED_PATCH | TRANSDERMAL | Status: DC
  Administered 2011-10-18 – 2011-10-27 (×4): 25 ug via TRANSDERMAL
  Filled 2011-10-18 (×6): qty 1

## 2011-10-18 MED ORDER — OXYCODONE HCL 5 MG PO TABS
5.0000 mg | ORAL_TABLET | ORAL | Status: DC | PRN
  Administered 2011-10-20 – 2011-10-27 (×19): 5 mg via ORAL
  Filled 2011-10-18 (×23): qty 1

## 2011-10-18 NOTE — Progress Notes (Addendum)
ANTICOAGULATION/ANTIBIOTIC CONSULT NOTE - Follow Up Consult  Pharmacy Consult for vancomycin/heparin Indication: septic thrombophlebitis, r/o endocarditis/  DVT at site of PICC  Allergies  Allergen Reactions  . Other Swelling    Pinto beans cause swelling and hives  . Penicillins Swelling    Patient Measurements: Height: 5\' 5"  (165.1 cm) Weight: 177 lb 14.6 oz (80.7 kg) IBW/kg (Calculated) : 57  Heparin Dosing Weight: 75kg  Vital Signs: Temp: 99.2 F (37.3 C) (04/21 0600) Temp src: Oral (04/21 0600) BP: 137/83 mmHg (04/21 0600) Pulse Rate: 92  (04/21 0600)  Labs:  Basename 10/18/11 0650 10/17/11 0858 10/16/11 0450  HGB 8.4* 9.0* --  HCT 25.5* 26.9* 24.8*  PLT 269 215 182  APTT -- -- --  LABPROT -- -- --  INR -- -- --  HEPARINUNFRC 0.38 0.38 0.33  CREATININE 0.53 0.49* 0.56  CKTOTAL -- -- --  CKMB -- -- --  TROPONINI -- -- --   Estimated Creatinine Clearance: 60.8 ml/min (by C-G formula based on Cr of 0.53).   Medications:  Scheduled:     . fentaNYL  25 mcg Transdermal Q72H  . insulin aspart  0-9 Units Subcutaneous Q4H  . pantoprazole (PROTONIX) IV  40 mg Intravenous Q24H  . potassium chloride  10 mEq Intravenous Q1 Hr x 5  . vancomycin  1,000 mg Intravenous Q8H  . DISCONTD: alteplase  2 mg Intracatheter Once  . DISCONTD: gentamicin  70 mg Intravenous Q8H    Assessment: 78 yo female with new upper extremity DVT on doppler. Patient is on long-term TPN and developed Septic thrombophlebitis at PICC site; INR on 04/16 1.58 (not on coumadin PTA). Heparin level therapeutic but at lower end of goal, CBC is stable. Patient also on vancomycin with coag negative staph as well as gram positive cocci in clusters found in blood cultures. Vancomycin trough drawn this am = 19.5 mcg/ml on 1gm IV q8h, previous trough on same dose 4/19 = 16.55mcg/ml. Gentamicin stopped 4/20. ID and CCM teams concerned for potential endocarditis  Goal of Therapy:  Heparin level 0.3-0.7  units/ml Vancomycin trough 15-93mcg/ml   Plan:  -Continue heparin at current rate as heparin level stable **? Plan for long-term AC, will PO be appropriate w/ short gut** -Currently on vancomycin 1gm IV q8h, trough this am shows she may be accumulating drug as I would suspect in 78 Y.O.  on this dose.  I am more comfortable with vancomycin 1250mg  IV q12h for est pk=35, trough = 14.   Dannielle Huh 10/18/2011,9:12 AM

## 2011-10-18 NOTE — Progress Notes (Signed)
PARENTERAL NUTRITION CONSULT NOTE - FOLLOW UP  Pharmacy Consult for TPN Indication: chronic TPN at home for short - gut syndrome  Allergies  Allergen Reactions  . Other Swelling    Pinto beans cause swelling and hives  . Penicillins Swelling    Patient Measurements: Height: 5\' 5"  (165.1 cm) Weight: 177 lb 14.6 oz (80.7 kg) IBW/kg (Calculated) : 57  Adjusted Body Weight:  Usual Weight: 162 lbs  Vital Signs: Temp: 99.2 F (37.3 C) (04/21 0600) Temp src: Oral (04/21 0600) BP: 137/83 mmHg (04/21 0600) Pulse Rate: 92  (04/21 0600) Intake/Output from previous day: 04/20 0701 - 04/21 0700 In: 3248 [I.V.:296; IV Piggyback:400; TPN:2552] Out: -  Intake/Output from this shift:    Labs:  [facility] 10/18/11 0650 10/17/11 0858 10/16/11 0450  WBC 6.2 9.2 6.1  HGB 8.4* 9.0* 8.3*  HCT 25.5* 26.9* 24.8*  PLT 269 215 182  APTT -- -- --  INR -- -- --     Alvira Philips 10/18/11 0650 10/17/11 0858 10/16/11 0450 10/15/11 2055  NA 131* 128* 130* --  K 3.7 3.3* 2.8* --  CL 97 95* 97 --  CO2 23 23 24  --  GLUCOSE 130* 130* 122* --  BUN 11 8 11  --  CREATININE 0.53 0.49* 0.56 --  LABCREA -- -- -- --  CREAT24HRUR -- -- -- --  CALCIUM 8.5 8.4 8.0* --  MG 1.8 -- 2.0 2.6*  PHOS -- -- -- 3.1  PROT -- -- -- --  ALBUMIN -- -- -- --  AST -- -- -- --  ALT -- -- -- --  ALKPHOS -- -- -- --  BILITOT -- -- -- --  BILIDIR -- -- -- --  IBILI -- -- -- --  PREALBUMIN -- -- -- --  TRIG -- -- -- --  CHOLHDL -- -- -- --  CHOL -- -- -- --   Estimated Creatinine Clearance: 60.8 ml/min (by C-G formula based on Cr of 0.53).    Basename 10/18/11 0831 10/18/11 0349 10/17/11 2348  GLUCAP 144* 120* 149*    Medical History: Past Medical History  Diagnosis Date  . Hypertension   . Reflux   . Right elbow pain     OTIF  . Dementia   . Depression   . Osteoarthritis   . Pancreatitis 11/2007    HOP  . Angiomyolipoma of kidney     right  . Ulcerative esophagitis 12/05/2007    hx elevated gastrin,  severe on EGD by Dr Jena Gauss , h pylori negative  . Hiatal hernia   . S/P colonoscopy 2009    pt reports normal by Dr Lovell Sheehan  . Diabetes mellitus   . Anemia   . Hyponatremia   . Cellulitis   . Esophagitis   . Peptic ulcer   . Insomnia     Medications:  Prescriptions prior to admission  Medication Sig Dispense Refill  . acetaminophen (TYLENOL) 500 MG tablet Take 500 mg by mouth daily as needed. For pain      . amLODipine (NORVASC) 5 MG tablet Take 5 mg by mouth daily.      . benazepril (LOTENSIN) 20 MG tablet Take 20 mg by mouth daily.       . butalbital-acetaminophen-caffeine (FIORICET, ESGIC) 50-325-40 MG per tablet Take 2 tablets by mouth every 8 (eight) hours as needed. For headache      . calcium-vitamin D (OSCAL WITH D) 500-200 MG-UNIT per tablet Take 1 tablet by mouth daily.       Marland Kitchen  cloNIDine (CATAPRES - DOSED IN MG/24 HR) 0.1 mg/24hr patch Place 1 patch onto the skin once a week. Change on Mondays      . darifenacin (ENABLEX) 15 MG 24 hr tablet Take 15 mg by mouth daily.       . ergocalciferol (VITAMIN D2) 50000 UNITS capsule Take 50,000 Units by mouth once a week.      . insulin aspart (NOVOLOG) 100 UNIT/ML injection Inject 3 Units into the skin 3 (three) times daily before meals. Sliding scale-give 3 units sq for CBG >150      . insulin regular (NOVOLIN R,HUMULIN R) 100 units/mL injection Inject 65 Units into the skin daily.      . Loperamide HCl (IMODIUM PO) Take 2 mg by mouth every 4 (four) hours as needed. For diarrhea      . metFORMIN (GLUCOPHAGE) 1000 MG tablet Take 1,000 mg by mouth daily with breakfast.       . metoprolol tartrate (LOPRESSOR) 12.5 mg TABS Take 12.5 mg by mouth 2 (two) times daily.      . Multiple Vitamin (MULITIVITAMIN WITH MINERALS) TABS Take 1 tablet by mouth daily.      . ondansetron (ZOFRAN) 4 MG tablet Take 4 mg by mouth every 8 (eight) hours as needed. For nausea       . oxyCODONE (ROXICODONE) 5 MG/5ML solution Take 5 mg by mouth every 4 (four)  hours as needed. For pain      . pantoprazole (PROTONIX) 40 MG tablet Take 40 mg by mouth 2 (two) times daily before a meal.      . PRESCRIPTION MEDICATION Inject into the vein continuous. Home TPN.  Patient gets supply from [facility] [phone]).      . QUEtiapine (SEROQUEL) 25 MG tablet Take 25 mg by mouth at bedtime.       . traZODone (DESYREL) 50 MG tablet Take 25-50 mg by mouth at bedtime as needed. For sleep        Insulin Requirements in the past 24 hours:  1 units Novolog SSI and 50 unit regular insulin in TPN  Current Nutrition: Ice chips   Nutritional Goals:  1650-1850 kCal, 85-100 grams of protein per day   Assessment:  [name] admitted from [facility] where she is on chronic TPN and takes POs only for pleasure.   TPN initially not started last night d/t occluded port and other ports needed for other meds.  Peripheral line started and vancomycin given peripheral and TPN started ~7am via CVC. Of note, TPN not started 4/19 PM as d/c'd by MD but I have clarified with [facility] that TPN is to be ordered.  Plan for PICC to be placed today for long-term TPN to continue.    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 NS at [facility] Pulm: 98% on RA Cards: hx HTN - BP mildly elevated, HR stable, off pressors.  Heparin gtt for new DVT Hepatobil: alk phos/ALT mildly  elevated,  AST/tbili WNL - monitor Neuro: hx depression (seroquel / trazodone PTA), some confusion ID:  Vanc D#6 for septic thrombophlebitis (r/o IE) and CRBSI with CoNS, E.coli UTI, afebrile today, WBC WNL Best Practices: PPI IV, heparin gtt   Plan:  Continue Clinimix E5/15 at 80 ml/hr with lipids at 8 ml/hr, to closely match home formula and meeting 100% of patient's needs  - MVI and TE MWF only due to national shortage  - Monitor I/O's, lytes  - Labs in am  Juliette Alcide, PharmD, BCPS.  Pager: [phone] 10/18/2011, 9:32 AM

## 2011-10-18 NOTE — Progress Notes (Signed)
Subjective: Patient seen and examined, she has history of perforated duodenal ulcer, status post antrectomy, duodenal resection, Billroth 2 reconstruction, omental patching of Duodenal bulb, open cholecystectomy, gastrostomy and jejunostomy on 05/07/11. Her course was complicated by epigastric abscess 05/31/11. 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 Brought to the ER on 4/15 w/ fever and purulent discharge from PICC insertion site, abd pain and UA concerning for UTI. She was started on broad spectrum abx, ID has been consulted. Blood cultures grew coagulase negative staph , she also had e coli growing from urine.  At this time the right picc has been removed and she is getting TNA wire peripheral IV. She also has central line in place. Patient has been getting IV fentanyl when necessary for pain Objective: Vital signs in last 24 hours: Temp:  [98.7 F (37.1 C)-99.8 F (37.7 C)] 99.2 F (37.3 C) (04/21 0600) Pulse Rate:  [90-93] 92  (04/21 0600) Resp:  [20] 20  (04/21 0600) BP: (137-149)/(76-83) 137/83 mmHg (04/21 0600) SpO2:  [97 %-99 %] 97 % (04/21 0600) Weight:  [80.7 kg (177 lb 14.6 oz)] 80.7 kg (177 lb 14.6 oz) (04/21 0600) Weight change:  Last BM Date: 10/15/11  Intake/Output from previous day: 04/20 0701 - 04/21 0700 In: 3248 [I.V.:296; IV Piggyback:400; TPN:2552] Out: -      Physical Exam: HEENT: Atraumatic, normocephalic Neck: Supple Chest : Clear to auscultation bilaterally, no wheezing, no crackles Heart : S1 S2 Regular, no murmurs Abdomen: Soft, Nontender, no organomegaly, peg tube is in place with suction Ext : No cyanosis, clubbing, edema   Lab Results: Basic Metabolic Panel:  Basename 10/18/11 0650 10/17/11 0858 10/16/11 0450 10/15/11 2055  NA 131* 128* -- --  K 3.7 3.3* -- --  CL 97 95* -- --  CO2 23 23 -- --  GLUCOSE 130* 130* -- --  BUN 11 8 -- --  CREATININE 0.53 0.49* -- --  CALCIUM 8.5 8.4 --  --  MG 1.8 -- 2.0 --  PHOS -- -- -- 3.1   Liver Function Tests: No results found for this basename: AST:2,ALT:2,ALKPHOS:2,BILITOT:2,PROT:2,ALBUMIN:2 in the last 72 hours  Basename 10/18/11 0650 10/17/11 0858  WBC 6.2 9.2  NEUTROABS -- --  HGB 8.4* 9.0*  HCT 25.5* 26.9*  MCV 79.2 77.3*  PLT 269 215   CBG:  Basename 10/18/11 0831 10/18/11 0349 10/17/11 2348 10/17/11 2039 10/17/11 1623 10/17/11 1223  GLUCAP 144* 120* 149* 105* 126* 122*   Hemoglobin A1C: No results found for this basename: HGBA1C in the last 72 hours Fasting Lipid Panel: No results found for this basename: CHOL,HDL,LDLCALC,TRIG,CHOLHDL,LDLDIRECT in the last 72 hours Thyroid Function Tests: No results found for this basename: TSH,T4TOTAL,FREET4,T3FREE,THYROIDAB in the last 72 hours Anemia Panel: No results found for this basename: VITAMINB12,FOLATE,FERRITIN,TIBC,IRON,RETICCTPCT in the last 72 hours Coagulation: No results found for this basename: LABPROT:2,INR:2 in the last 72 hours Urine Drug Screen: Drugs of Abuse  No results found for this basename: labopia,  cocainscrnur,  labbenz,  amphetmu,  thcu,  labbarb    Alcohol Level: No results found for this basename: ETH:2 in the last 72 hours Urinalysis: No results found for this basename: COLORURINE:2,APPERANCEUR:2,LABSPEC:2,PHURINE:2,GLUCOSEU:2,HGBUR:2,BILIRUBINUR:2,KETONESUR:2,PROTEINUR:2,UROBILINOGEN:2,NITRITE:2,LEUKOCYTESUR:2 in the last 72 hours Misc. Labs:  Recent Results (from the past 240 hour(s))  URINE CULTURE     Status: Normal   Collection Time   10/12/11  7:52 PM      Component Value Range Status Comment   Specimen Description URINE, CATHETERIZED  Final    Special Requests ADDED 10/12/11 2017   Final    Culture  Setup Time [id number]   Final    Colony Count >=100,000 COLONIES/ML   Final    Culture ESCHERICHIA COLI   Final    Report Status 10/15/2011 FINAL   Final    Organism ID, Bacteria ESCHERICHIA COLI   Final   CULTURE, BLOOD (ROUTINE  X 2)     Status: Normal   Collection Time   10/12/11  8:00 PM      Component Value Range Status Comment   Specimen Description BLOOD HAND LEFT   Final    Special Requests     Final    Value: BOTTLES DRAWN AEROBIC AND ANAEROBIC AERO 10CC, ANAE 5CC   Culture  Setup Time     Final    Value: [id number] This organism DOES NOT demonstrate inducible Clindamycin resistance in vitro.   Culture     Final    Value: STREPTOCOCCUS SPECIES     STAPHYLOCOCCUS SPECIES (COAGULASE NEGATIVE)     Note: Identified as Streptococcus alactolyticus     Note: Gram Stain Report Called to,Read Back By and Verified With: CHRISTY FRALEY @1132  10/13/11 BY KRAWS   Report Status 10/17/2011 FINAL   Final    Organism ID, Bacteria STAPHYLOCOCCUS SPECIES (COAGULASE NEGATIVE)   Final    Organism ID, Bacteria STREPTOCOCCUS SPECIES   Final   CULTURE, BLOOD (ROUTINE X 2)     Status: Normal   Collection Time   10/12/11  8:13 PM      Component Value Range Status Comment   Specimen Description BLOOD HAND RIGHT   Final    Special Requests BOTTLES DRAWN AEROBIC ONLY 10CC   Final    Culture  Setup Time [id number]   Final    Culture     Final    Value: STREPTOCOCCUS SPECIES     STAPHYLOCOCCUS SPECIES (COAGULASE NEGATIVE)     Note: Identified as Streptococcus alactolyticus SUSCEPTIBILITIES PERFORMED ON PREVIOUS CULTURE WITHIN THE LAST 5 DAYS.     Note: Gram Stain Report Called to,Read Back By and Verified With: LAUREN CONNALLY @ 1828 ON 10/13/11 BY GOLLD   Report Status 10/17/2011 FINAL   Final   CATH TIP CULTURE     Status: Normal   Collection Time   10/13/11  3:47 AM      Component Value Range Status Comment   Specimen Description CATH TIP   Final    Special Requests NONE   Final    Culture     Final    Value: >100 COLONIES STAPHYLOCOCCUS SPECIES (COAGULASE NEGATIVE)     Note: RIFAMPIN AND GENTAMICIN SHOULD NOT BE USED AS SINGLE DRUGS FOR TREATMENT OF STAPH INFECTIONS. This organism DOES NOT demonstrate inducible Clindamycin  resistance in vitro.   Report Status 10/16/2011 FINAL   Final    Organism ID, Bacteria STAPHYLOCOCCUS SPECIES (COAGULASE NEGATIVE)   Final   MRSA PCR SCREENING     Status: Normal   Collection Time   10/13/11  3:51 AM      Component Value Range Status Comment   MRSA by PCR NEGATIVE  NEGATIVE  Final   MRSA PCR SCREENING     Status: Normal   Collection Time   10/13/11  3:35 PM      Component Value Range Status Comment   MRSA by PCR NEGATIVE  NEGATIVE  Final   CULTURE, BLOOD (ROUTINE X 2)     Status: Normal  Collection Time   10/13/11  5:00 PM      Component Value Range Status Comment   Specimen Description BLOOD RIGHT ARM   Final    Special Requests BOTTLES DRAWN AEROBIC ONLY 10CC   Final    Culture  Setup Time [id number]   Final    Culture     Final    Value: STAPHYLOCOCCUS SPECIES (COAGULASE NEGATIVE)     Note: SUSCEPTIBILITIES PERFORMED ON PREVIOUS CULTURE WITHIN THE LAST 5 DAYS.     Note: Gram Stain Report Called to,Read Back By and Verified With: MARK SIVNGE @0436  ON 10/16/11 BY MCLET   Report Status 10/17/2011 FINAL   Final   CULTURE, BLOOD (ROUTINE X 2)     Status: Normal   Collection Time   10/13/11  5:08 PM      Component Value Range Status Comment   Specimen Description BLOOD RIGHT HAND   Final    Special Requests BOTTLES DRAWN AEROBIC ONLY 5CC   Final    Culture  Setup Time [id number]   Final    Culture     Final    Value: STAPHYLOCOCCUS SPECIES (COAGULASE NEGATIVE)     Note: SUSCEPTIBILITIES PERFORMED ON PREVIOUS CULTURE WITHIN THE LAST 5 DAYS.     Note: Gram Stain Report Called to,Read Back By and Verified With: ASHLEY HARRISON @0627  ON 10/15/11 BY MCLET   Report Status 10/16/2011 FINAL   Final   URINE CULTURE     Status: Normal   Collection Time   10/13/11  5:12 PM      Component Value Range Status Comment   Specimen Description URINE, CATHETERIZED   Final    Special Requests NONE   Final    Culture  Setup Time [id number]   Final    Colony Count NO GROWTH    Final    Culture NO GROWTH   Final    Report Status 10/14/2011 FINAL   Final     Studies/Results: Ct Humerus Right W Contrast  10/16/2011  *RADIOLOGY REPORT*  Clinical Data: Upper extremity venous thrombosis.  Redness about the elbow.  Pain.  CT OF THE RIGHT HUMERUS WITH CONTRAST  Technique:  Multidetector CT imaging was performed following the standard protocol during bolus administration of intravenous contrast.  Contrast: 80mL OMNIPAQUE IOHEXOL 300 MG/ML  SOLN  Comparison: Plain films of the elbow 12/09/2007. Report of upper extremity ultrasound 10/14/2011 reviewed.  Findings: As described report of comparison ultrasound, clot is seen in the left subclavian, axillary and brachial veins. Subclavian vein is not well demonstrated on this examination as it is not scanned in its entirety.  Clot is well demonstrated extending to a point 18 cm below the top of the humeral head. Extensive subcutaneous edema is present.  There is no focal fluid collection to suggest abscess.  No focal bony abnormality is identified.  There is some dependent atelectasis in the right lung. Imaged intra-abdominal contents are unremarkable.  IMPRESSION: Clot in the left subclavian, axillary and brachial veins as described above with associated subcutaneous edema.  Negative for abscess.  Original Report Authenticated By: Bernadene Bell. Maricela Curet, M.D.    Medications: Scheduled Meds:    . insulin aspart  0-9 Units Subcutaneous Q4H  . pantoprazole (PROTONIX) IV  40 mg Intravenous Q24H  . potassium chloride  10 mEq Intravenous Q1 Hr x 5  . vancomycin  1,000 mg Intravenous Q8H  . DISCONTD: alteplase  2 mg Intracatheter Once  . DISCONTD: gentamicin  70 mg Intravenous Q8H  Continuous Infusions:    . sodium chloride 20 mL/hr at 10/14/11 1113  . TPN (CLINIMIX) +/- additives 80 mL/hr at 10/18/11 0700   And  . fat emulsion 250 mL (10/18/11 0700)  . heparin 1,850 Units/hr (10/18/11 0700)   PRN Meds:.acetaminophen, acetaminophen,  fentaNYL, ondansetron (ZOFRAN) IV, ondansetron (ZOFRAN) IV, ondansetron  Assessment/Plan:   Sepsis Resolved. Continue vancomycin for coag neg staph ID is following   DIABETES MELLITUS, TYPE II Cont SSI  Hyponatremia Improving Sec to GI losses.   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 At this time the right picc has been removed, will order the PICC line in the left arm.  DVT of  Right upper extremity Cont IV Heparin    UTI (lower urinary tract infection) Most likely colonization as per ID  Bacteremia due to gram positive rods and cocci Sec to infected picc line. Cont Vancomycin ID following  H/O C diff Treated in February. No diarrhea at this time  Hypokalemia: Resolved  Chronic pain Will start Fentanyl patch 25 mcg/hr Q 72 hr.   Dementia; Pleasantly confused.    LOS: 6 days   [facility] Pager: [phone] 10/18/2011, 8:57 AM

## 2011-10-19 LAB — CBC
Hemoglobin: 8 g/dL — ABNORMAL LOW (ref 12.0–15.0)
MCH: 26.7 pg (ref 26.0–34.0)
MCV: 79 fL (ref 78.0–100.0)
RBC: 3 MIL/uL — ABNORMAL LOW (ref 3.87–5.11)
WBC: 5.8 10*3/uL (ref 4.0–10.5)

## 2011-10-19 LAB — COMPREHENSIVE METABOLIC PANEL
ALT: 10 U/L (ref 0–35)
Albumin: 1.9 g/dL — ABNORMAL LOW (ref 3.5–5.2)
Alkaline Phosphatase: 89 U/L (ref 39–117)
Calcium: 8.5 mg/dL (ref 8.4–10.5)
Potassium: 3.7 mEq/L (ref 3.5–5.1)
Sodium: 130 mEq/L — ABNORMAL LOW (ref 135–145)
Total Protein: 6 g/dL (ref 6.0–8.3)

## 2011-10-19 LAB — DIFFERENTIAL
Eosinophils Absolute: 0.2 10*3/uL (ref 0.0–0.7)
Lymphocytes Relative: 29 % (ref 12–46)
Lymphs Abs: 1.7 10*3/uL (ref 0.7–4.0)
Monocytes Relative: 11 % (ref 3–12)
Neutrophils Relative %: 57 % (ref 43–77)

## 2011-10-19 LAB — GLUCOSE, CAPILLARY
Glucose-Capillary: 127 mg/dL — ABNORMAL HIGH (ref 70–99)
Glucose-Capillary: 132 mg/dL — ABNORMAL HIGH (ref 70–99)
Glucose-Capillary: 138 mg/dL — ABNORMAL HIGH (ref 70–99)

## 2011-10-19 LAB — MAGNESIUM: Magnesium: 1.7 mg/dL (ref 1.5–2.5)

## 2011-10-19 LAB — CHOLESTEROL, TOTAL: Cholesterol: 88 mg/dL (ref 0–200)

## 2011-10-19 LAB — HEPARIN LEVEL (UNFRACTIONATED): Heparin Unfractionated: 0.56 IU/mL (ref 0.30–0.70)

## 2011-10-19 LAB — PREALBUMIN: Prealbumin: 8.2 mg/dL — ABNORMAL LOW (ref 17.0–34.0)

## 2011-10-19 MED ORDER — MORPHINE SULFATE 2 MG/ML IJ SOLN
2.0000 mg | INTRAMUSCULAR | Status: DC | PRN
  Administered 2011-10-19 – 2011-10-27 (×22): 2 mg via INTRAVENOUS
  Filled 2011-10-19 (×24): qty 1

## 2011-10-19 MED ORDER — FAT EMULSION 20 % IV EMUL
250.0000 mL | INTRAVENOUS | Status: AC
  Administered 2011-10-19: 250 mL via INTRAVENOUS
  Filled 2011-10-19: qty 250

## 2011-10-19 MED ORDER — TRACE MINERALS CR-CU-MN-SE-ZN 10-1000-500-60 MCG/ML IV SOLN
INTRAVENOUS | Status: AC
  Administered 2011-10-19: 18:00:00 via INTRAVENOUS
  Filled 2011-10-19: qty 2000

## 2011-10-19 NOTE — Progress Notes (Signed)
Nutrition Follow-up  Diet Order:  NPO  Currently PEG is to suction for epigastric abscess. Patient receiving home TPN due to gastroileal fistula with short gut syndrome. Last BM: 4/18 TPN: Clinimix E 5/15 @ 80 ml/hr with 20% lipids @ 8 ml/hr, MVI/Trace elements MWF due to shortage. Current prescription provides: 1747 kcals (>100% of minimum needs), 96 grams protein (> 100% of minimum needs), with GIR of 2.48.  Meds: Scheduled Meds:   . fentaNYL  25 mcg Transdermal Q72H  . fentaNYL  25 mcg Transdermal Once  . insulin aspart  0-9 Units Subcutaneous Q4H  . pantoprazole (PROTONIX) IV  40 mg Intravenous Q24H  . sodium chloride  10-40 mL Intracatheter Q12H  . vancomycin  1,250 mg Intravenous Q12H   Continuous Infusions:   . sodium chloride 20 mL/hr at 10/14/11 1113  . TPN (CLINIMIX) +/- additives 80 mL/hr at 10/18/11 0700   And  . fat emulsion 250 mL (10/18/11 0700)  . TPN (CLINIMIX) +/- additives 80 mL/hr at 10/18/11 1731   And  . fat emulsion 250 mL (10/18/11 1732)  . TPN (CLINIMIX) +/- additives     And  . fat emulsion    . heparin 1,850 Units/hr (10/19/11 0647)   PRN Meds:.acetaminophen, acetaminophen, morphine injection, ondansetron (ZOFRAN) IV, ondansetron (ZOFRAN) IV, ondansetron, oxyCODONE, sodium chloride  Labs:  CMP     Component Value Date/Time   NA 130* 10/19/2011 1125   K 3.7 10/19/2011 1125   CL 100 10/19/2011 1125   CO2 22 10/19/2011 1125   GLUCOSE 126* 10/19/2011 1125   BUN 13 10/19/2011 1125   CREATININE 0.54 10/19/2011 1125   CALCIUM 8.5 10/19/2011 1125   PROT 6.0 10/19/2011 1125   ALBUMIN 1.9* 10/19/2011 1125   AST 10 10/19/2011 1125   ALT 10 10/19/2011 1125   ALKPHOS 89 10/19/2011 1125   BILITOT 0.2* 10/19/2011 1125   GFRNONAA 88* 10/19/2011 1125   GFRAA >90 10/19/2011 1125   CBG (last 3)   Basename 10/19/11 1135 10/19/11 0739 10/19/11 0359  GLUCAP 119* 137* 138*    Lab Results  Component Value Date   HGBA1C 6.7* 05/27/2011     Intake/Output Summary  (Last 24 hours) at 10/19/11 1602 Last data filed at 10/18/11 2200  Gross per 24 hour  Intake      0 ml  Output      1 ml  Net     -1 ml    Weight Status:   177 lbs 4/22 177 lbs 4/21 179 lbs 4/19  Re-estimated needs:  Kcal: 1650-1850 Protein: 85-100 grams  Nutrition Dx:  Altered GI function (NI-1.4) related to gastroileal fistula with short gut syndrome AEB NPO status/need for long term TPN. Status: Ongoing  Goal:  TPN to meet nutrition needs; met  Intervention:   None at this time  Monitor:   RD to monitor TPN with Pharmacy.  Will assist with transition to enteral nutrition when/if possible   Kendell Bane Cornelison Pager #:  [phone]

## 2011-10-19 NOTE — Progress Notes (Addendum)
PARENTERAL NUTRITION CONSULT NOTE - FOLLOW UP  Pharmacy Consult for TPN Indication: chronic TPN at home for short - gut syndrome  Allergies  Allergen Reactions  . Other Swelling    Pinto beans cause swelling and hives  . Penicillins Swelling    Patient Measurements: Height: 5\' 5"  (165.1 cm) Weight: 177 lb 7.5 oz (80.5 kg) IBW/kg (Calculated) : 57  Adjusted Body Weight:  Usual Weight: 162 lbs  Vital Signs: Temp: 100 F (37.8 C) (04/22 0620) Temp src: Oral (04/22 0620) BP: 149/79 mmHg (04/22 0620) Pulse Rate: 93  (04/22 0620) Intake/Output from previous day: 04/21 0701 - 04/22 0700 In: 1210 [IV Piggyback:250; TPN:960] Out: 1 [Urine:1] Intake/Output from this shift:    Labs:  [facility] 10/19/11 1125 10/18/11 0650 10/17/11 0858  WBC 5.8 6.2 9.2  HGB 8.0* 8.4* 9.0*  HCT 23.7* 25.5* 26.9*  PLT 247 269 215  APTT -- -- --  INR -- -- --     Basename 10/19/11 1125 10/18/11 0650 10/17/11 0858  NA 130* 131* 128*  K 3.7 3.7 3.3*  CL 100 97 95*  CO2 22 23 23   GLUCOSE 126* 130* 130*  BUN 13 11 8   CREATININE 0.54 0.53 0.49*  LABCREA -- -- --  CREAT24HRUR -- -- --  CALCIUM 8.5 8.5 8.4  MG 1.7 1.8 --  PHOS 4.2 -- --  PROT 6.0 -- --  ALBUMIN 1.9* -- --  AST 10 -- --  ALT 10 -- --  ALKPHOS 89 -- --  BILITOT 0.2* -- --  BILIDIR -- -- --  IBILI -- -- --  PREALBUMIN -- -- --  TRIG 82 -- --  CHOLHDL -- -- --  CHOL 88 -- --   Estimated Creatinine Clearance: 60.8 ml/min (by C-G formula based on Cr of 0.54).    Basename 10/19/11 1135 10/19/11 0739 10/19/11 0359  GLUCAP 119* 137* 138*    Medical History: Past Medical History  Diagnosis Date  . Hypertension   . Reflux   . Right elbow pain     OTIF  . Dementia   . Depression   . Osteoarthritis   . Pancreatitis 11/2007    HOP  . Angiomyolipoma of kidney     right  . Ulcerative esophagitis 12/05/2007    hx elevated gastrin, severe on EGD by Dr Jena Gauss , h pylori negative  . Hiatal hernia   . S/P colonoscopy  2009    pt reports normal by Dr Lovell Sheehan  . Diabetes mellitus   . Anemia   . Hyponatremia   . Cellulitis   . Esophagitis   . Peptic ulcer   . Insomnia     Medications:  Prescriptions prior to admission  Medication Sig Dispense Refill  . acetaminophen (TYLENOL) 500 MG tablet Take 500 mg by mouth daily as needed. For pain      . amLODipine (NORVASC) 5 MG tablet Take 5 mg by mouth daily.      . benazepril (LOTENSIN) 20 MG tablet Take 20 mg by mouth daily.       . butalbital-acetaminophen-caffeine (FIORICET, ESGIC) 50-325-40 MG per tablet Take 2 tablets by mouth every 8 (eight) hours as needed. For headache      . calcium-vitamin D (OSCAL WITH D) 500-200 MG-UNIT per tablet Take 1 tablet by mouth daily.       . cloNIDine (CATAPRES - DOSED IN MG/24 HR) 0.1 mg/24hr patch Place 1 patch onto the skin once a week. Change on Mondays      .  darifenacin (ENABLEX) 15 MG 24 hr tablet Take 15 mg by mouth daily.       . ergocalciferol (VITAMIN D2) 50000 UNITS capsule Take 50,000 Units by mouth once a week.      . insulin aspart (NOVOLOG) 100 UNIT/ML injection Inject 3 Units into the skin 3 (three) times daily before meals. Sliding scale-give 3 units sq for CBG >150      . insulin regular (NOVOLIN R,HUMULIN R) 100 units/mL injection Inject 65 Units into the skin daily.      . Loperamide HCl (IMODIUM PO) Take 2 mg by mouth every 4 (four) hours as needed. For diarrhea      . metFORMIN (GLUCOPHAGE) 1000 MG tablet Take 1,000 mg by mouth daily with breakfast.       . metoprolol tartrate (LOPRESSOR) 12.5 mg TABS Take 12.5 mg by mouth 2 (two) times daily.      . Multiple Vitamin (MULITIVITAMIN WITH MINERALS) TABS Take 1 tablet by mouth daily.      . ondansetron (ZOFRAN) 4 MG tablet Take 4 mg by mouth every 8 (eight) hours as needed. For nausea       . oxyCODONE (ROXICODONE) 5 MG/5ML solution Take 5 mg by mouth every 4 (four) hours as needed. For pain      . pantoprazole (PROTONIX) 40 MG tablet Take 40 mg by  mouth 2 (two) times daily before a meal.      . PRESCRIPTION MEDICATION Inject into the vein continuous. Home TPN.  Patient gets supply from [facility] [phone]).      . QUEtiapine (SEROQUEL) 25 MG tablet Take 25 mg by mouth at bedtime.       . traZODone (DESYREL) 50 MG tablet Take 25-50 mg by mouth at bedtime as needed. For sleep        Insulin Requirements in the past 24 hours:  2 units Novolog SSI and 50 unit regular insulin in TPN  Current Nutrition: Ice chips   Nutritional Goals:  1650-1850 kCal, 85-100 grams of protein per day   Assessment:  [name] admitted from [facility] where she is on chronic TPN and takes POs only for pleasure.   PICC line placed in left arm.  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 NS at [facility] Pulm: 98% on RA Cards: hx HTN - BP mildly elevated, HR 91-106, off pressors.  Heparin gtt for new DVT Hepatobil: LFTs WNL - monitor Neuro: hx depression (seroquel / trazodone PTA), some confusion ID:  Vanc D#6 for septic thrombophlebitis (r/o IE) and CRBSI with CoNS, E.coli UTI, afebrile today, WBC WNL Best Practices: PPI IV, heparin gtt   Plan:  Continue Clinimix E5/15 at 80 ml/hr with lipids at 8 ml/hr, to closely match home formula and meeting 100% of patient's needs  - MVI and TE MWF only due to national shortage  - Monitor I/O's, lytes   Talbert Cage, PharmD 10/19/2011, 12:40  PM

## 2011-10-19 NOTE — Progress Notes (Signed)
ANTICOAGULATION CONSULT NOTE - Follow Up Consult  Pharmacy Consult for Heparin Indication: DVT at site of PICC  Allergies  Allergen Reactions  . Other Swelling    Pinto beans cause swelling and hives  . Penicillins Swelling    Patient Measurements: Height: 5\' 5"  (165.1 cm) Weight: 177 lb 7.5 oz (80.5 kg) IBW/kg (Calculated) : 57  Heparin Dosing Weight: 75 kg  Vital Signs: Temp: 100.6 F (38.1 C) (04/22 1400) Temp src: Oral (04/22 1400) BP: 165/85 mmHg (04/22 1400) Pulse Rate: 101  (04/22 1400)  Labs:  Basename 10/19/11 1125 10/18/11 0650 10/17/11 0858  HGB 8.0* 8.4* --  HCT 23.7* 25.5* 26.9*  PLT 247 269 215  APTT -- -- --  LABPROT -- -- --  INR -- -- --  HEPARINUNFRC 0.56 0.38 0.38  CREATININE 0.54 0.53 0.49*  CKTOTAL -- -- --  CKMB -- -- --  TROPONINI -- -- --   Estimated Creatinine Clearance: 60.8 ml/min (by C-G formula based on Cr of 0.54).   Medications:  Infusions:    . sodium chloride 20 mL/hr at 10/14/11 1113  . TPN (CLINIMIX) +/- additives 80 mL/hr at 10/18/11 0700   And  . fat emulsion 250 mL (10/18/11 0700)  . TPN (CLINIMIX) +/- additives 80 mL/hr at 10/18/11 1731   And  . fat emulsion 250 mL (10/18/11 1732)  . TPN (CLINIMIX) +/- additives     And  . fat emulsion    . heparin 1,850 Units/hr (10/19/11 4098)    Assessment: 78 yo female with new upper extremity DVT at PICC site. Heparin level is therapeutic. No bleeding noted however Hb seems to be trending down slowly.   Goal of Therapy:  Heparin level 0.3-0.7 units/ml   Plan:  1. Continue heparin at 1850 units/hr (18.5 ml/hr) 2. Will follow daily heparin level and CBC 3. Plan for long-term po anticoagulation with short gut?  [name] 10/19/2011,2:25 PM

## 2011-10-19 NOTE — Progress Notes (Signed)
Clinical Social Worker staffed case with [employer].  CSW updated [facility] [place].  CSW to continue to follow and assist as needed.   Angelia Mould, MSW, [place] [phone]

## 2011-10-19 NOTE — Progress Notes (Signed)
Patient ID: [name]  female  RUE:[id number]    DOB: March 25, 1932    DOA: 10/12/2011  PCP: [name], MD, MD  Interim history she has history of perforated duodenal ulcer, status post antrectomy, duodenal resection, Billroth 2 reconstruction, omental patching of Duodenal bulb, open cholecystectomy, gastrostomy and jejunostomy on 05/07/11. Her course was complicated by epigastric abscess 05/31/11. 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 Brought to the ER on 4/15 w/ fever and purulent discharge from PICC insertion site, abd pain and UA concerning for UTI. She was started on broad spectrum abx, ID has been consulted. Blood cultures grew coagulase negative staph and Streptococcus alactolyticus, she also had e coli growing from urine. Repeat blood cultures on 10/17/2011 has been negative so far. At this time the right picc has been removed and she is getting TNA wire peripheral IV. She also has central line in place. Patient has been getting IV fentanyl when necessary for pain   Subjective: Alert and awake, denies any specific complaints, PEG tube to intermittent wall suction, on TNA  Objective: Weight change: -0.2 kg (-7.1 oz)  Intake/Output Summary (Last 24 hours) at 10/19/11 0911 Last data filed at 10/18/11 2200  Gross per 24 hour  Intake      0 ml  Output      1 ml  Net     -1 ml   Blood pressure 149/79, pulse 93, temperature 100 F (37.8 C), temperature source Oral, resp. rate 18, height 5\' 5"  (1.651 m), weight 80.5 kg (177 lb 7.5 oz), SpO2 97.00%.  Physical Exam: General: Alert and awake, not in any acute distress. HEENT: anicteric sclera, pupils reactive to light and accommodation, EOMI CVS: S1-S2 clear, no murmur rubs or gallops Chest: clear to auscultation bilaterally, no wheezing, rales or rhonchi Abdomen: soft nontender, PEG tube to intermittent wall suction Extremities: no cyanosis, clubbing or edema noted  bilaterally   Lab Results: Basic Metabolic Panel:  Lab 10/18/11 1478 10/17/11 0858 10/15/11 2055  NA 131* 128* --  K 3.7 3.3* --  CL 97 95* --  CO2 23 23 --  GLUCOSE 130* 130* --  BUN 11 8 --  CREATININE 0.53 0.49* --  CALCIUM 8.5 8.4 --  MG 1.8 -- --  PHOS -- -- 3.1   Liver Function Tests:  Lab 10/15/11 0500 10/14/11 0500  AST 20 46*  ALT 40* 57*  ALKPHOS 119* 113  BILITOT 0.4 0.3  PROT 5.7* 5.2*  ALBUMIN 1.9* 1.8*    Lab 10/13/11 1630  LIPASE 18  AMYLASE --   No results found for this basename: AMMONIA:2 in the last 168 hours CBC:  Lab 10/18/11 0650 10/17/11 0858 10/15/11 0500  WBC 6.2 9.2 --  NEUTROABS -- -- 6.2  HGB 8.4* 9.0* --  HCT 25.5* 26.9* --  MCV 79.2 77.3* --  PLT 269 215 --   Cardiac Enzymes:  Lab 10/13/11 1630  CKTOTAL 213*  CKMB 2.4  CKMBINDEX --  TROPONINI <0.30   BNP: No components found with this basename: POCBNP:2 CBG:  Lab 10/19/11 0359 10/19/11 0009 10/18/11 2035 10/18/11 1726 10/18/11 1144  GLUCAP 138* 109* 172* 136* 143*     Micro Results: Recent Results (from the past 240 hour(s))  URINE CULTURE     Status: Normal   Collection Time   10/12/11  7:52 PM      Component Value Range Status Comment   Specimen Description URINE, CATHETERIZED   Final  Special Requests ADDED 10/12/11 2017   Final    Culture  Setup Time [id number]   Final    Colony Count >=100,000 COLONIES/ML   Final    Culture ESCHERICHIA COLI   Final    Report Status 10/15/2011 FINAL   Final    Organism ID, Bacteria ESCHERICHIA COLI   Final   CULTURE, BLOOD (ROUTINE X 2)     Status: Normal   Collection Time   10/12/11  8:00 PM      Component Value Range Status Comment   Specimen Description BLOOD HAND LEFT   Final    Special Requests     Final    Value: BOTTLES DRAWN AEROBIC AND ANAEROBIC AERO 10CC, ANAE 5CC   Culture  Setup Time     Final    Value: [id number] This organism DOES NOT demonstrate inducible Clindamycin resistance in vitro.   Culture      Final    Value: STREPTOCOCCUS SPECIES     STAPHYLOCOCCUS SPECIES (COAGULASE NEGATIVE)     Note: Identified as Streptococcus alactolyticus     Note: Gram Stain Report Called to,Read Back By and Verified With: CHRISTY FRALEY @1132  10/13/11 BY KRAWS   Report Status 10/17/2011 FINAL   Final    Organism ID, Bacteria STAPHYLOCOCCUS SPECIES (COAGULASE NEGATIVE)   Final    Organism ID, Bacteria STREPTOCOCCUS SPECIES   Final   CULTURE, BLOOD (ROUTINE X 2)     Status: Normal   Collection Time   10/12/11  8:13 PM      Component Value Range Status Comment   Specimen Description BLOOD HAND RIGHT   Final    Special Requests BOTTLES DRAWN AEROBIC ONLY 10CC   Final    Culture  Setup Time [id number]   Final    Culture     Final    Value: STREPTOCOCCUS SPECIES     STAPHYLOCOCCUS SPECIES (COAGULASE NEGATIVE)     Note: Identified as Streptococcus alactolyticus SUSCEPTIBILITIES PERFORMED ON PREVIOUS CULTURE WITHIN THE LAST 5 DAYS.     Note: Gram Stain Report Called to,Read Back By and Verified With: LAUREN CONNALLY @ 1828 ON 10/13/11 BY GOLLD   Report Status 10/17/2011 FINAL   Final   CATH TIP CULTURE     Status: Normal   Collection Time   10/13/11  3:47 AM      Component Value Range Status Comment   Specimen Description CATH TIP   Final    Special Requests NONE   Final    Culture     Final    Value: >100 COLONIES STAPHYLOCOCCUS SPECIES (COAGULASE NEGATIVE)     Note: RIFAMPIN AND GENTAMICIN SHOULD NOT BE USED AS SINGLE DRUGS FOR TREATMENT OF STAPH INFECTIONS. This organism DOES NOT demonstrate inducible Clindamycin resistance in vitro.   Report Status 10/16/2011 FINAL   Final    Organism ID, Bacteria STAPHYLOCOCCUS SPECIES (COAGULASE NEGATIVE)   Final   MRSA PCR SCREENING     Status: Normal   Collection Time   10/13/11  3:51 AM      Component Value Range Status Comment   MRSA by PCR NEGATIVE  NEGATIVE  Final   MRSA PCR SCREENING     Status: Normal   Collection Time   10/13/11  3:35 PM      Component  Value Range Status Comment   MRSA by PCR NEGATIVE  NEGATIVE  Final   CULTURE, BLOOD (ROUTINE X 2)     Status: Normal   Collection Time  10/13/11  5:00 PM      Component Value Range Status Comment   Specimen Description BLOOD RIGHT ARM   Final    Special Requests BOTTLES DRAWN AEROBIC ONLY 10CC   Final    Culture  Setup Time [id number]   Final    Culture     Final    Value: STAPHYLOCOCCUS SPECIES (COAGULASE NEGATIVE)     Note: SUSCEPTIBILITIES PERFORMED ON PREVIOUS CULTURE WITHIN THE LAST 5 DAYS.     Note: Gram Stain Report Called to,Read Back By and Verified With: MARK SIVNGE @0436  ON 10/16/11 BY MCLET   Report Status 10/17/2011 FINAL   Final   CULTURE, BLOOD (ROUTINE X 2)     Status: Normal   Collection Time   10/13/11  5:08 PM      Component Value Range Status Comment   Specimen Description BLOOD RIGHT HAND   Final    Special Requests BOTTLES DRAWN AEROBIC ONLY 5CC   Final    Culture  Setup Time [id number]   Final    Culture     Final    Value: STAPHYLOCOCCUS SPECIES (COAGULASE NEGATIVE)     Note: SUSCEPTIBILITIES PERFORMED ON PREVIOUS CULTURE WITHIN THE LAST 5 DAYS.     Note: Gram Stain Report Called to,Read Back By and Verified With: ASHLEY HARRISON @0627  ON 10/15/11 BY MCLET   Report Status 10/16/2011 FINAL   Final   URINE CULTURE     Status: Normal   Collection Time   10/13/11  5:12 PM      Component Value Range Status Comment   Specimen Description URINE, CATHETERIZED   Final    Special Requests NONE   Final    Culture  Setup Time [id number]   Final    Colony Count NO GROWTH   Final    Culture NO GROWTH   Final    Report Status 10/14/2011 FINAL   Final   CULTURE, BLOOD (ROUTINE X 2)     Status: Normal (Preliminary result)   Collection Time   10/17/11  9:15 AM      Component Value Range Status Comment   Specimen Description BLOOD RIGHT HAND   Final    Special Requests BOTTLES DRAWN AEROBIC ONLY 10CC   Final    Culture  Setup Time [id number]   Final    Culture      Final    Value:        BLOOD CULTURE RECEIVED NO GROWTH TO DATE CULTURE WILL BE HELD FOR 5 DAYS BEFORE ISSUING A FINAL NEGATIVE REPORT   Report Status PENDING   Incomplete   CULTURE, BLOOD (ROUTINE X 2)     Status: Normal (Preliminary result)   Collection Time   10/17/11  9:25 AM      Component Value Range Status Comment   Specimen Description BLOOD LEFT HAND   Final    Special Requests BOTTLES DRAWN AEROBIC ONLY [facility]   Final    Culture  Setup Time [id number]   Final    Culture     Final    Value:        BLOOD CULTURE RECEIVED NO GROWTH TO DATE CULTURE WILL BE HELD FOR 5 DAYS BEFORE ISSUING A FINAL NEGATIVE REPORT   Report Status PENDING   Incomplete     Studies/Results: Ct Humerus Right W Contrast  10/16/2011  *RADIOLOGY REPORT*  Clinical Data: Upper extremity venous thrombosis.  Redness about the elbow.  Pain.  CT OF THE RIGHT HUMERUS WITH  CONTRAST  Technique:  Multidetector CT imaging was performed following the standard protocol during bolus administration of intravenous contrast.  Contrast: 80mL OMNIPAQUE IOHEXOL 300 MG/ML  SOLN  Comparison: Plain films of the elbow 12/09/2007. Report of upper extremity ultrasound 10/14/2011 reviewed.  Findings: As described report of comparison ultrasound, clot is seen in the left subclavian, axillary and brachial veins. Subclavian vein is not well demonstrated on this examination as it is not scanned in its entirety.  Clot is well demonstrated extending to a point 18 cm below the top of the humeral head. Extensive subcutaneous edema is present.  There is no focal fluid collection to suggest abscess.  No focal bony abnormality is identified.  There is some dependent atelectasis in the right lung. Imaged intra-abdominal contents are unremarkable.  IMPRESSION: Clot in the left subclavian, axillary and brachial veins as described above with associated subcutaneous edema.  Negative for abscess.  Original Report Authenticated By: Bernadene Bell. Maricela Curet, M.D.   Ct  Abdomen Pelvis W Contrast  10/13/2011  *RADIOLOGY REPORT*  Clinical Data: 78 year old with sepsis.  CT ABDOMEN AND PELVIS WITH CONTRAST  Technique:  Multidetector CT imaging of the abdomen and pelvis was performed following the standard protocol during bolus administration of intravenous contrast.  Contrast: 80mL OMNIPAQUE IOHEXOL 300 MG/ML  SOLN  Comparison: 07/29/2011  Findings: There are tiny bilateral pleural effusions with basilar atelectasis.  A small amount of focal atelectasis or round atelectasis at the right lung base on sequence 3, image 7.  No evidence for free air.  Coronary artery calcifications.  The gallbladder has been removed. Again noted is low density fluid or postsurgical changes between the stomach and left hepatic lobe, best seen on sequence 2, image 23.  This area roughly measures 2.1 cm and similar to the prior examination.  There is residual fluid or edema in the gallbladder fossa and along the inferior liver margin.  No evidence for a drainable fluid collection.  Gastrostomy tube is present.  Again noted is a large calcification near the pancreatic head and neck. There is oral contrast within the stomach and large bowel.  No gross abnormality to the liver, portal venous system, adrenal glands, kidneys or spleen.  Again noted are low density structures within the kidneys which are too small to definitively characterize but could represent cysts.  Calcifications associated with the uterus and cannot exclude fibroids.  No significant free fluid in the pelvis.  There is a right hip replacement.  No significant lymphadenopathy. A Foley catheter within the urinary bladder.  IMPRESSION:  Postsurgical changes in the upper abdomen.  Residual low density fluid or edema around the liver has minimally changed since 07/29/2011.  Tiny pleural effusions with bibasilar atelectasis.  Original Report Authenticated By: Richarda Overlie, M.D.   Ir Fluoro Guide Cv Line Right  09/29/2011  *RADIOLOGY REPORT*   Clinical Data: Diabetes  PICC LINE PLACEMENT WITH ULTRASOUND AND FLUOROSCOPIC  GUIDANCE  Fluoroscopy Time: 0.5 minutes.  The right arm was prepped with chlorhexidine, draped in the usual sterile fashion using maximum barrier technique (cap and mask, sterile gown, sterile gloves, large sterile sheet, hand hygiene and cutaneous antisepsis) and infiltrated locally with 1% Lidocaine.  Ultrasound demonstrated patency of the right basilic vein, and this was documented with an image.  Under real-time ultrasound guidance, this vein was accessed with a 21 gauge micropuncture needle and image documentation was performed.  The needle was exchanged over a guidewire for a peel-away sheath through which a five [place] double lumen  PICC trimmed to 40 cm was advanced, positioned with its tip at the lower SVC/right atrial junction.  Fluoroscopy during the procedure and fluoro spot radiograph confirms appropriate catheter position.  The catheter was flushed, secured to the skin with Prolene sutures, and covered with a sterile dressing.  Complications:  None  IMPRESSION: Successful right arm PICC line placement with ultrasound and fluoroscopic guidance.  The catheter is ready for use.  Original Report Authenticated By: Donavan Burnet, M.D.   Ir US Guide Vasc Access Right  09/29/2011  *RADIOLOGY REPORT*  Clinical Data: Diabetes  PICC LINE PLACEMENT WITH ULTRASOUND AND FLUOROSCOPIC  GUIDANCE  Fluoroscopy Time: 0.5 minutes.  The right arm was prepped with chlorhexidine, draped in the usual sterile fashion using maximum barrier technique (cap and mask, sterile gown, sterile gloves, large sterile sheet, hand hygiene and cutaneous antisepsis) and infiltrated locally with 1% Lidocaine.  Ultrasound demonstrated patency of the right basilic vein, and this was documented with an image.  Under real-time ultrasound guidance, this vein was accessed with a 21 gauge micropuncture needle and image documentation was performed.  The needle was  exchanged over a guidewire for a peel-away sheath through which a five [place] double lumen PICC trimmed to 40 cm was advanced, positioned with its tip at the lower SVC/right atrial junction.  Fluoroscopy during the procedure and fluoro spot radiograph confirms appropriate catheter position.  The catheter was flushed, secured to the skin with Prolene sutures, and covered with a sterile dressing.  Complications:  None  IMPRESSION: Successful right arm PICC line placement with ultrasound and fluoroscopic guidance.  The catheter is ready for use.  Original Report Authenticated By: Donavan Burnet, M.D.   Dg Chest Port 1 View  10/13/2011  *RADIOLOGY REPORT*  Clinical Data: Line placement.  PORTABLE CHEST - 1 VIEW  Comparison: 10/12/2011  Findings: Cardiomegaly with vascular congestion.  Diffuse interstitial prominence may reflect early interstitial edema.  No confluent opacities or effusions.  No acute bony abnormality.  Left central line is in place.  The tip is in the upper right atrium.  No pneumothorax.  IMPRESSION: Cardiomegaly, vascular congestion.  Question early interstitial edema.  Original Report Authenticated By: Cyndie Chime, M.D.   Dg Chest Port 1 View  10/12/2011  *RADIOLOGY REPORT*  Clinical Data: Fever.  Drainage about a PICC.  PORTABLE CHEST - 1 VIEW  Comparison: Fluoroscopic spot view from PICC placement 09/29/2011. Plain films of the chest 07/28/2011.  CT chest 01/26/2011.  Findings: The patient's right PICC has backed out with the tip in the right axilla.  Lungs are clear.  Heart size is normal.  No pneumothorax or pleural effusion.  IMPRESSION:  1.  Tip of the patient's right PICC is now in the right axilla. 2.  No acute cardiopulmonary disease.  Original Report Authenticated By: Bernadene Bell. Maricela Curet, M.D.   Dg Abd Portable 2v  10/13/2011  *RADIOLOGY REPORT*  Clinical Data: Abdominal pain.  PORTABLE ABDOMEN - 2 VIEW  Comparison: CT 10/13/2011  Findings: Oral contrast material seen within the  colon.  There is a nonobstructive bowel gas pattern.  No free air on the decubitus views.  No organomegaly.  IMPRESSION: No obstruction or free air.  Original Report Authenticated By: Cyndie Chime, M.D.    Medications: Scheduled Meds:   . fentaNYL  25 mcg Transdermal Q72H  . fentaNYL  25 mcg Transdermal Once  . insulin aspart  0-9 Units Subcutaneous Q4H  . pantoprazole (PROTONIX) IV  40 mg Intravenous  Q24H  . sodium chloride  10-40 mL Intracatheter Q12H  . vancomycin  1,250 mg Intravenous Q12H  . white petrolatum      . DISCONTD: vancomycin  1,000 mg Intravenous Q8H   Continuous Infusions:   . sodium chloride 20 mL/hr at 10/14/11 1113  . TPN (CLINIMIX) +/- additives 80 mL/hr at 10/18/11 0700   And  . fat emulsion 250 mL (10/18/11 0700)  . TPN (CLINIMIX) +/- additives 80 mL/hr at 10/18/11 1731   And  . fat emulsion 250 mL (10/18/11 1732)  . heparin 1,850 Units/hr (10/19/11 1610)     Assessment/Plan:  Sepsis:Resolved.  Continue vancomycin for coag neg staph  ID is following   DIABETES MELLITUS, TYPE RU:EAVW SSI   Hyponatremia: Improving, Sec to GI losses.   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 Sharl Ma discussed with Dr Lindie Spruce, who stated that someone will see the patient soon, but no new recommendation at this time.  At this time the right picc has been removed, PICC line ordered in the left arm.   DVT of Right upper extremity: Cont IV Heparin   Escherichia coli UTI (lower urinary tract infection): Most likely colonization as per ID   Coag negative staph and strept Bacteremia:  Sec to infected picc line.  - Cont Vancomycin, ID following   H/O C diff:Treated in February:No diarrhea at this time   Hypokalemia: Resolved   Chronic pain: On Fentanyl patch 25 mcg/hr Q 72 hr.   Dementia; pleasantly confused.  DVT Prophylaxis: On heparin drip  Code Status: Full  code  Disposition: Not medically ready   LOS: 7 days   Jacqulyne Gladue M.D. [facility] 10/19/2011, 9:11 AM Pager: [phone]

## 2011-10-19 NOTE — Progress Notes (Signed)
Subjective: No new complaints  Antibiotics:  Anti-infectives     Start     Dose/Rate Route Frequency Ordered Stop   10/18/11 2100   vancomycin (VANCOCIN) 1,250 mg in sodium chloride 0.9 % 250 mL IVPB        1,250 mg 166.7 mL/hr over 90 Minutes Intravenous Every 12 hours 10/18/11 0927     10/16/11 1500   gentamicin (GARAMYCIN) 70 mg in dextrose 5 % 50 mL IVPB  Status:  Discontinued        70 mg 103.5 mL/hr over 30 Minutes Intravenous Every 8 hours 10/16/11 1425 10/17/11 1015   10/15/11 1400   vancomycin (VANCOCIN) IVPB 1000 mg/200 mL premix  Status:  Discontinued        1,000 mg 200 mL/hr over 60 Minutes Intravenous Every 8 hours 10/15/11 0634 10/18/11 0928   10/13/11 2200   aztreonam (AZACTAM) 1 g in dextrose 5 % 50 mL IVPB  Status:  Discontinued        1 g 100 mL/hr over 30 Minutes Intravenous 3 times per day 10/13/11 1619 10/13/11 1639   10/13/11 1800   cefTAZidime (FORTAZ) 1 g in dextrose 5 % 50 mL IVPB  Status:  Discontinued        1 g 100 mL/hr over 30 Minutes Intravenous Every 8 hours 10/13/11 1640 10/16/11 1201   10/13/11 1615   aztreonam (AZACTAM) 2 g in dextrose 5 % 50 mL IVPB  Status:  Discontinued        2 g 100 mL/hr over 30 Minutes Intravenous  Once 10/13/11 1611 10/13/11 1612   10/13/11 1615   vancomycin (VANCOCIN) IVPB 1000 mg/200 mL premix  Status:  Discontinued        1,000 mg 200 mL/hr over 60 Minutes Intravenous  Once 10/13/11 1611 10/13/11 1612   10/13/11 1615   metroNIDAZOLE (FLAGYL) IVPB 500 mg  Status:  Discontinued        500 mg 100 mL/hr over 60 Minutes Intravenous  Once 10/13/11 1611 10/13/11 1612   10/13/11 1430   Ampicillin-Sulbactam (UNASYN) 3 g in sodium chloride 0.9 % 100 mL IVPB  Status:  Discontinued        3 g 100 mL/hr over 60 Minutes Intravenous STAT 10/13/11 1417 10/13/11 1444   10/13/11 0600   vancomycin (VANCOCIN) 1,250 mg in sodium chloride 0.9 % 250 mL IVPB  Status:  Discontinued        1,250 mg 166.7 mL/hr over 90 Minutes  Intravenous Every 12 hours 10/13/11 0457 10/15/11 0632   10/13/11 0600   aztreonam (AZACTAM) 1 g in dextrose 5 % 50 mL IVPB  Status:  Discontinued        1 g 100 mL/hr over 30 Minutes Intravenous 3 times per day 10/13/11 0507 10/13/11 1526   10/13/11 0400   metroNIDAZOLE (FLAGYL) IVPB 500 mg  Status:  Discontinued        500 mg 100 mL/hr over 60 Minutes Intravenous Every 8 hours 10/13/11 0323 10/16/11 1201   10/12/11 1915   vancomycin (VANCOCIN) IVPB 1000 mg/200 mL premix        1,000 mg 200 mL/hr over 60 Minutes Intravenous  Once 10/12/11 1901 10/12/11 2227   10/12/11 1915   ceFEPIme (MAXIPIME) 2 g in dextrose 5 % 50 mL IVPB        2 g 100 mL/hr over 30 Minutes Intravenous To [facility] 10/12/11 1901 10/12/11 2105          Medications: Scheduled  Meds:    . fentaNYL  25 mcg Transdermal Q72H  . fentaNYL  25 mcg Transdermal Once  . insulin aspart  0-9 Units Subcutaneous Q4H  . pantoprazole (PROTONIX) IV  40 mg Intravenous Q24H  . sodium chloride  10-40 mL Intracatheter Q12H  . vancomycin  1,250 mg Intravenous Q12H  . white petrolatum       Continuous Infusions:    . sodium chloride 20 mL/hr at 10/14/11 1113  . TPN (CLINIMIX) +/- additives 80 mL/hr at 10/18/11 0700   And  . fat emulsion 250 mL (10/18/11 0700)  . TPN (CLINIMIX) +/- additives 80 mL/hr at 10/18/11 1731   And  . fat emulsion 250 mL (10/18/11 1732)  . heparin 1,850 Units/hr (10/19/11 0647)   PRN Meds:.acetaminophen, acetaminophen, morphine injection, ondansetron (ZOFRAN) IV, ondansetron (ZOFRAN) IV, ondansetron, oxyCODONE, sodium chloride   Objective: Weight change: -7.1 oz (-0.2 kg)  Intake/Output Summary (Last 24 hours) at 10/19/11 1216 Last data filed at 10/18/11 2200  Gross per 24 hour  Intake      0 ml  Output      1 ml  Net     -1 ml   Blood pressure 149/79, pulse 93, temperature 100 F (37.8 C), temperature source Oral, resp. rate 18, height 5\' 5"  (1.651 m), weight 177 lb 7.5 oz  (80.5 kg), SpO2 97.00%. Temp:  [97.8 F (36.6 C)-100 F (37.8 C)] 100 F (37.8 C) (04/22 0620) Pulse Rate:  [91-106] 93  (04/22 0620) Resp:  [18-20] 18  (04/22 0620) BP: (138-176)/(79-110) 149/79 mmHg (04/22 0620) SpO2:  [96 %-98 %] 97 % (04/22 0620) Weight:  [177 lb 7.5 oz (80.5 kg)] 177 lb 7.5 oz (80.5 kg) (04/22 1610)  Physical Exam: General: Alert and awake, Chronically ill appearin CV: tachycardic no mgr Pulm: fairly clear GI; soft nondistended nontender EXT: RUE with swelling and difficulty flexing extending about elbow   Lab Results:  Basename 10/19/11 1125 10/18/11 0650  WBC 5.8 6.2  HGB 8.0* 8.4*  HCT 23.7* 25.5*  PLT 247 269    BMET  Basename 10/19/11 1125 10/18/11 0650  NA 130* 131*  K 3.7 3.7  CL 100 97  CO2 22 23  GLUCOSE 126* 130*  BUN 13 11  CREATININE 0.54 0.53  CALCIUM 8.5 8.5    Micro Results: Recent Results (from the past 240 hour(s))  URINE CULTURE     Status: Normal   Collection Time   10/12/11  7:52 PM      Component Value Range Status Comment   Specimen Description URINE, CATHETERIZED   Final    Special Requests ADDED 10/12/11 2017   Final    Culture  Setup Time [id number]   Final    Colony Count >=100,000 COLONIES/ML   Final    Culture ESCHERICHIA COLI   Final    Report Status 10/15/2011 FINAL   Final    Organism ID, Bacteria ESCHERICHIA COLI   Final   CULTURE, BLOOD (ROUTINE X 2)     Status: Normal   Collection Time   10/12/11  8:00 PM      Component Value Range Status Comment   Specimen Description BLOOD HAND LEFT   Final    Special Requests     Final    Value: BOTTLES DRAWN AEROBIC AND ANAEROBIC AERO 10CC, ANAE 5CC   Culture  Setup Time     Final    Value: [id number] This organism DOES NOT demonstrate inducible Clindamycin resistance in vitro.  Culture     Final    Value: STREPTOCOCCUS SPECIES     STAPHYLOCOCCUS SPECIES (COAGULASE NEGATIVE)     Note: Identified as Streptococcus alactolyticus     Note: Gram Stain Report  Called to,Read Back By and Verified With: CHRISTY FRALEY @1132  10/13/11 BY KRAWS   Report Status 10/17/2011 FINAL   Final    Organism ID, Bacteria STAPHYLOCOCCUS SPECIES (COAGULASE NEGATIVE)   Final    Organism ID, Bacteria STREPTOCOCCUS SPECIES   Final   CULTURE, BLOOD (ROUTINE X 2)     Status: Normal   Collection Time   10/12/11  8:13 PM      Component Value Range Status Comment   Specimen Description BLOOD HAND RIGHT   Final    Special Requests BOTTLES DRAWN AEROBIC ONLY 10CC   Final    Culture  Setup Time [id number]   Final    Culture     Final    Value: STREPTOCOCCUS SPECIES     STAPHYLOCOCCUS SPECIES (COAGULASE NEGATIVE)     Note: Identified as Streptococcus alactolyticus SUSCEPTIBILITIES PERFORMED ON PREVIOUS CULTURE WITHIN THE LAST 5 DAYS.     Note: Gram Stain Report Called to,Read Back By and Verified With: LAUREN CONNALLY @ 1828 ON 10/13/11 BY GOLLD   Report Status 10/17/2011 FINAL   Final   CATH TIP CULTURE     Status: Normal   Collection Time   10/13/11  3:47 AM      Component Value Range Status Comment   Specimen Description CATH TIP   Final    Special Requests NONE   Final    Culture     Final    Value: >100 COLONIES STAPHYLOCOCCUS SPECIES (COAGULASE NEGATIVE)     Note: RIFAMPIN AND GENTAMICIN SHOULD NOT BE USED AS SINGLE DRUGS FOR TREATMENT OF STAPH INFECTIONS. This organism DOES NOT demonstrate inducible Clindamycin resistance in vitro.   Report Status 10/16/2011 FINAL   Final    Organism ID, Bacteria STAPHYLOCOCCUS SPECIES (COAGULASE NEGATIVE)   Final   MRSA PCR SCREENING     Status: Normal   Collection Time   10/13/11  3:51 AM      Component Value Range Status Comment   MRSA by PCR NEGATIVE  NEGATIVE  Final   MRSA PCR SCREENING     Status: Normal   Collection Time   10/13/11  3:35 PM      Component Value Range Status Comment   MRSA by PCR NEGATIVE  NEGATIVE  Final   CULTURE, BLOOD (ROUTINE X 2)     Status: Normal   Collection Time   10/13/11  5:00 PM       Component Value Range Status Comment   Specimen Description BLOOD RIGHT ARM   Final    Special Requests BOTTLES DRAWN AEROBIC ONLY 10CC   Final    Culture  Setup Time [id number]   Final    Culture     Final    Value: STAPHYLOCOCCUS SPECIES (COAGULASE NEGATIVE)     Note: SUSCEPTIBILITIES PERFORMED ON PREVIOUS CULTURE WITHIN THE LAST 5 DAYS.     Note: Gram Stain Report Called to,Read Back By and Verified With: MARK SIVNGE @0436  ON 10/16/11 BY MCLET   Report Status 10/17/2011 FINAL   Final   CULTURE, BLOOD (ROUTINE X 2)     Status: Normal   Collection Time   10/13/11  5:08 PM      Component Value Range Status Comment   Specimen Description BLOOD RIGHT HAND  Final    Special Requests BOTTLES DRAWN AEROBIC ONLY 5CC   Final    Culture  Setup Time [id number]   Final    Culture     Final    Value: STAPHYLOCOCCUS SPECIES (COAGULASE NEGATIVE)     Note: SUSCEPTIBILITIES PERFORMED ON PREVIOUS CULTURE WITHIN THE LAST 5 DAYS.     Note: Gram Stain Report Called to,Read Back By and Verified With: ASHLEY HARRISON @0627  ON 10/15/11 BY MCLET   Report Status 10/16/2011 FINAL   Final   URINE CULTURE     Status: Normal   Collection Time   10/13/11  5:12 PM      Component Value Range Status Comment   Specimen Description URINE, CATHETERIZED   Final    Special Requests NONE   Final    Culture  Setup Time [id number]   Final    Colony Count NO GROWTH   Final    Culture NO GROWTH   Final    Report Status 10/14/2011 FINAL   Final   CULTURE, BLOOD (ROUTINE X 2)     Status: Normal (Preliminary result)   Collection Time   10/17/11  9:15 AM      Component Value Range Status Comment   Specimen Description BLOOD RIGHT HAND   Final    Special Requests BOTTLES DRAWN AEROBIC ONLY 10CC   Final    Culture  Setup Time [id number]   Final    Culture     Final    Value:        BLOOD CULTURE RECEIVED NO GROWTH TO DATE CULTURE WILL BE HELD FOR 5 DAYS BEFORE ISSUING A FINAL NEGATIVE REPORT   Report Status PENDING    Incomplete   CULTURE, BLOOD (ROUTINE X 2)     Status: Normal (Preliminary result)   Collection Time   10/17/11  9:25 AM      Component Value Range Status Comment   Specimen Description BLOOD LEFT HAND   Final    Special Requests BOTTLES DRAWN AEROBIC ONLY [facility]   Final    Culture  Setup Time [id number]   Final    Culture     Final    Value:        BLOOD CULTURE RECEIVED NO GROWTH TO DATE CULTURE WILL BE HELD FOR 5 DAYS BEFORE ISSUING A FINAL NEGATIVE REPORT   Report Status PENDING   Incomplete     Studies/Results: No results found.    Assessment/Plan: [name] is a 78 y.o. female admtited withseptic physiology and confusing micro findings  Inititally. Data showing CNS bacteremia that is persistent and concerning for septic thrombophlebitis and endovascular infection. She also grew PCN sensitive Strep species but not clear to me is in both or 1/2 admission blood cultures. Her blood cultures finally on the 20th seem to be clearing   1) CNS bactermia possible septic thrombophebitis: She also reportedly may have a 2nd organism PCN sensitive strep in 1/2 or both  admission cultures. Vascular surgery have seen and dont think she needs intervention from them at the time. IF she fails to clear her cultures we will have to call them back and I am going to treat as endovascular infection in terms of duration of therapy  --continue vancomycin --would also get TEE given persistently positive blood cultures --would treat for 6 weeks minimum --need clarificatio from micro re PCN sensitive strep 1 or both admission cxs     LOS: 7 days   Acey Lav 10/19/2011,  12:16 PM

## 2011-10-20 ENCOUNTER — Encounter (HOSPITAL_COMMUNITY): Payer: Self-pay | Admitting: General Surgery

## 2011-10-20 DIAGNOSIS — R109 Unspecified abdominal pain: Secondary | ICD-10-CM

## 2011-10-20 LAB — CBC
HCT: 27.4 % — ABNORMAL LOW (ref 36.0–46.0)
Hemoglobin: 9.1 g/dL — ABNORMAL LOW (ref 12.0–15.0)
MCV: 79.7 fL (ref 78.0–100.0)
RBC: 3.44 MIL/uL — ABNORMAL LOW (ref 3.87–5.11)
RDW: 17.5 % — ABNORMAL HIGH (ref 11.5–15.5)
WBC: 6.9 10*3/uL (ref 4.0–10.5)

## 2011-10-20 LAB — GLUCOSE, CAPILLARY
Glucose-Capillary: 135 mg/dL — ABNORMAL HIGH (ref 70–99)
Glucose-Capillary: 142 mg/dL — ABNORMAL HIGH (ref 70–99)
Glucose-Capillary: 114 mg/dL — ABNORMAL HIGH (ref 70–99)
Glucose-Capillary: 138 mg/dL — ABNORMAL HIGH (ref 70–99)
Glucose-Capillary: 181 mg/dL — ABNORMAL HIGH (ref 70–99)

## 2011-10-20 LAB — HEPARIN LEVEL (UNFRACTIONATED): Heparin Unfractionated: 0.75 IU/mL — ABNORMAL HIGH (ref 0.30–0.70)

## 2011-10-20 MED ORDER — FAT EMULSION 20 % IV EMUL
250.0000 mL | INTRAVENOUS | Status: AC
  Administered 2011-10-20: 250 mL via INTRAVENOUS
  Filled 2011-10-20: qty 250

## 2011-10-20 MED ORDER — INSULIN REGULAR HUMAN 100 UNIT/ML IJ SOLN
INTRAVENOUS | Status: AC
  Administered 2011-10-20: 18:00:00 via INTRAVENOUS
  Filled 2011-10-20: qty 2000

## 2011-10-20 MED ORDER — DEXTROSE 5 % IV SOLN
2.0000 g | INTRAVENOUS | Status: DC
  Administered 2011-10-20 – 2011-10-26 (×7): 2 g via INTRAVENOUS
  Filled 2011-10-20 (×8): qty 2

## 2011-10-20 MED ORDER — METRONIDAZOLE 500 MG PO TABS
500.0000 mg | ORAL_TABLET | Freq: Three times a day (TID) | ORAL | Status: DC
  Administered 2011-10-21 – 2011-10-27 (×19): 500 mg via ORAL
  Filled 2011-10-20 (×24): qty 1

## 2011-10-20 NOTE — Progress Notes (Signed)
Clinical Social Worker continuing to follow for disposition planning. Pt is from [facility] and plan is to return at discharge. Clinical Social Worker updated FL2 and sent pt facility updated clinical information and contacted facility. Per MD, pt not yet medically ready for discharge and MD considering palliative medicine for goals of care if no significant improvement in next few days. Clinical Social Worker to continue to follow.  Jacklynn Lewis, MSW, LCSWA  Clinical Social Work [phone]

## 2011-10-20 NOTE — Progress Notes (Signed)
Subjective: No new complaints, pt to get TEE  Antibiotics:  Anti-infectives     Start     Dose/Rate Route Frequency Ordered Stop   10/18/11 2100   vancomycin (VANCOCIN) 1,250 mg in sodium chloride 0.9 % 250 mL IVPB        1,250 mg 166.7 mL/hr over 90 Minutes Intravenous Every 12 hours 10/18/11 0927     10/16/11 1500   gentamicin (GARAMYCIN) 70 mg in dextrose 5 % 50 mL IVPB  Status:  Discontinued        70 mg 103.5 mL/hr over 30 Minutes Intravenous Every 8 hours 10/16/11 1425 10/17/11 1015   10/15/11 1400   vancomycin (VANCOCIN) IVPB 1000 mg/200 mL premix  Status:  Discontinued        1,000 mg 200 mL/hr over 60 Minutes Intravenous Every 8 hours 10/15/11 0634 10/18/11 0928   10/13/11 2200   aztreonam (AZACTAM) 1 g in dextrose 5 % 50 mL IVPB  Status:  Discontinued        1 g 100 mL/hr over 30 Minutes Intravenous 3 times per day 10/13/11 1619 10/13/11 1639   10/13/11 1800   cefTAZidime (FORTAZ) 1 g in dextrose 5 % 50 mL IVPB  Status:  Discontinued        1 g 100 mL/hr over 30 Minutes Intravenous Every 8 hours 10/13/11 1640 10/16/11 1201   10/13/11 1615   aztreonam (AZACTAM) 2 g in dextrose 5 % 50 mL IVPB  Status:  Discontinued        2 g 100 mL/hr over 30 Minutes Intravenous  Once 10/13/11 1611 10/13/11 1612   10/13/11 1615   vancomycin (VANCOCIN) IVPB 1000 mg/200 mL premix  Status:  Discontinued        1,000 mg 200 mL/hr over 60 Minutes Intravenous  Once 10/13/11 1611 10/13/11 1612   10/13/11 1615   metroNIDAZOLE (FLAGYL) IVPB 500 mg  Status:  Discontinued        500 mg 100 mL/hr over 60 Minutes Intravenous  Once 10/13/11 1611 10/13/11 1612   10/13/11 1430   Ampicillin-Sulbactam (UNASYN) 3 g in sodium chloride 0.9 % 100 mL IVPB  Status:  Discontinued        3 g 100 mL/hr over 60 Minutes Intravenous STAT 10/13/11 1417 10/13/11 1444   10/13/11 0600   vancomycin (VANCOCIN) 1,250 mg in sodium chloride 0.9 % 250 mL IVPB  Status:  Discontinued        1,250 mg 166.7 mL/hr over 90  Minutes Intravenous Every 12 hours 10/13/11 0457 10/15/11 0632   10/13/11 0600   aztreonam (AZACTAM) 1 g in dextrose 5 % 50 mL IVPB  Status:  Discontinued        1 g 100 mL/hr over 30 Minutes Intravenous 3 times per day 10/13/11 0507 10/13/11 1526   10/13/11 0400   metroNIDAZOLE (FLAGYL) IVPB 500 mg  Status:  Discontinued        500 mg 100 mL/hr over 60 Minutes Intravenous Every 8 hours 10/13/11 0323 10/16/11 1201   10/12/11 1915   vancomycin (VANCOCIN) IVPB 1000 mg/200 mL premix        1,000 mg 200 mL/hr over 60 Minutes Intravenous  Once 10/12/11 1901 10/12/11 2227   10/12/11 1915   ceFEPIme (MAXIPIME) 2 g in dextrose 5 % 50 mL IVPB        2 g 100 mL/hr over 30 Minutes Intravenous To [facility] 10/12/11 1901 10/12/11 2105  Medications: Scheduled Meds:    . fentaNYL  25 mcg Transdermal Q72H  . fentaNYL  25 mcg Transdermal Once  . insulin aspart  0-9 Units Subcutaneous Q4H  . pantoprazole (PROTONIX) IV  40 mg Intravenous Q24H  . sodium chloride  10-40 mL Intracatheter Q12H  . vancomycin  1,250 mg Intravenous Q12H   Continuous Infusions:    . sodium chloride 500 mL (10/20/11 1127)  . TPN (CLINIMIX) +/- additives 80 mL/hr at 10/19/11 1742   And  . fat emulsion 250 mL (10/19/11 1742)  . TPN (CLINIMIX) +/- additives 80 mL/hr at 10/20/11 1734   And  . fat emulsion 250 mL (10/20/11 1734)  . heparin 1,700 Units/hr (10/20/11 1405)   PRN Meds:.acetaminophen, acetaminophen, morphine injection, ondansetron (ZOFRAN) IV, ondansetron (ZOFRAN) IV, ondansetron, oxyCODONE, sodium chloride   Objective: Weight change:   Intake/Output Summary (Last 24 hours) at 10/20/11 1815 Last data filed at 10/20/11 1500  Gross per 24 hour  Intake      0 ml  Output   2500 ml  Net  -2500 ml   Blood pressure 158/90, pulse 101, temperature 99.4 F (37.4 C), temperature source Oral, resp. rate 20, height 5\' 5"  (1.651 m), weight 177 lb 7.5 oz (80.5 kg), SpO2 94.00%. Temp:  [98.6  F (37 C)-99.9 F (37.7 C)] 99.4 F (37.4 C) (04/23 1400) Pulse Rate:  [100-105] 101  (04/23 1400) Resp:  [20] 20  (04/23 1400) BP: (158-160)/(82-90) 158/90 mmHg (04/23 1400) SpO2:  [94 %-96 %] 94 % (04/23 1400)  Physical Exam: General: Alert and awake, Chronically ill appearin CV: tachycardic no mgr Pulm: fairly clear GI; soft nondistended nontender EXT: RUE with swelling and difficulty flexing extending about elbow   Lab Results:  Basename 10/20/11 1230 10/19/11 1125  WBC 6.9 5.8  HGB 9.1* 8.0*  HCT 27.4* 23.7*  PLT 287 247    BMET  Basename 10/19/11 1125 10/18/11 0650  NA 130* 131*  K 3.7 3.7  CL 100 97  CO2 22 23  GLUCOSE 126* 130*  BUN 13 11  CREATININE 0.54 0.53  CALCIUM 8.5 8.5    Micro Results: Recent Results (from the past 240 hour(s))  URINE CULTURE     Status: Normal   Collection Time   10/12/11  7:52 PM      Component Value Range Status Comment   Specimen Description URINE, CATHETERIZED   Final    Special Requests ADDED 10/12/11 2017   Final    Culture  Setup Time [id number]   Final    Colony Count >=100,000 COLONIES/ML   Final    Culture ESCHERICHIA COLI   Final    Report Status 10/15/2011 FINAL   Final    Organism ID, Bacteria ESCHERICHIA COLI   Final   CULTURE, BLOOD (ROUTINE X 2)     Status: Normal   Collection Time   10/12/11  8:00 PM      Component Value Range Status Comment   Specimen Description BLOOD HAND LEFT   Final    Special Requests     Final    Value: BOTTLES DRAWN AEROBIC AND ANAEROBIC AERO 10CC, ANAE 5CC   Culture  Setup Time     Final    Value: [id number] This organism DOES NOT demonstrate inducible Clindamycin resistance in vitro.   Culture     Final    Value: STREPTOCOCCUS SPECIES     STAPHYLOCOCCUS SPECIES (COAGULASE NEGATIVE)     Note: Identified as Streptococcus alactolyticus  Note: Gram Stain Report Called to,Read Back By and Verified With: CHRISTY FRALEY @1132  10/13/11 BY KRAWS   Report Status 10/17/2011 FINAL    Final    Organism ID, Bacteria STAPHYLOCOCCUS SPECIES (COAGULASE NEGATIVE)   Final    Organism ID, Bacteria STREPTOCOCCUS SPECIES   Final   CULTURE, BLOOD (ROUTINE X 2)     Status: Normal   Collection Time   10/12/11  8:13 PM      Component Value Range Status Comment   Specimen Description BLOOD HAND RIGHT   Final    Special Requests BOTTLES DRAWN AEROBIC ONLY 10CC   Final    Culture  Setup Time [id number]   Final    Culture     Final    Value: STREPTOCOCCUS SPECIES     STAPHYLOCOCCUS SPECIES (COAGULASE NEGATIVE)     Note: Identified as Streptococcus alactolyticus SUSCEPTIBILITIES PERFORMED ON PREVIOUS CULTURE WITHIN THE LAST 5 DAYS.     Note: Gram Stain Report Called to,Read Back By and Verified With: LAUREN CONNALLY @ 1828 ON 10/13/11 BY GOLLD   Report Status 10/17/2011 FINAL   Final   CATH TIP CULTURE     Status: Normal   Collection Time   10/13/11  3:47 AM      Component Value Range Status Comment   Specimen Description CATH TIP   Final    Special Requests NONE   Final    Culture     Final    Value: >100 COLONIES STAPHYLOCOCCUS SPECIES (COAGULASE NEGATIVE)     Note: RIFAMPIN AND GENTAMICIN SHOULD NOT BE USED AS SINGLE DRUGS FOR TREATMENT OF STAPH INFECTIONS. This organism DOES NOT demonstrate inducible Clindamycin resistance in vitro.   Report Status 10/16/2011 FINAL   Final    Organism ID, Bacteria STAPHYLOCOCCUS SPECIES (COAGULASE NEGATIVE)   Final   MRSA PCR SCREENING     Status: Normal   Collection Time   10/13/11  3:51 AM      Component Value Range Status Comment   MRSA by PCR NEGATIVE  NEGATIVE  Final   MRSA PCR SCREENING     Status: Normal   Collection Time   10/13/11  3:35 PM      Component Value Range Status Comment   MRSA by PCR NEGATIVE  NEGATIVE  Final   CULTURE, BLOOD (ROUTINE X 2)     Status: Normal   Collection Time   10/13/11  5:00 PM      Component Value Range Status Comment   Specimen Description BLOOD RIGHT ARM   Final    Special Requests BOTTLES DRAWN  AEROBIC ONLY 10CC   Final    Culture  Setup Time [id number]   Final    Culture     Final    Value: STAPHYLOCOCCUS SPECIES (COAGULASE NEGATIVE)     Note: SUSCEPTIBILITIES PERFORMED ON PREVIOUS CULTURE WITHIN THE LAST 5 DAYS.     Note: Gram Stain Report Called to,Read Back By and Verified With: MARK SIVNGE @0436  ON 10/16/11 BY MCLET   Report Status 10/17/2011 FINAL   Final   CULTURE, BLOOD (ROUTINE X 2)     Status: Normal   Collection Time   10/13/11  5:08 PM      Component Value Range Status Comment   Specimen Description BLOOD RIGHT HAND   Final    Special Requests BOTTLES DRAWN AEROBIC ONLY [facility]   Final    Culture  Setup Time [id number]   Final    Culture  Final    Value: STAPHYLOCOCCUS SPECIES (COAGULASE NEGATIVE)     Note: SUSCEPTIBILITIES PERFORMED ON PREVIOUS CULTURE WITHIN THE LAST 5 DAYS.     Note: Gram Stain Report Called to,Read Back By and Verified With: ASHLEY HARRISON @0627  ON 10/15/11 BY MCLET   Report Status 10/16/2011 FINAL   Final   URINE CULTURE     Status: Normal   Collection Time   10/13/11  5:12 PM      Component Value Range Status Comment   Specimen Description URINE, CATHETERIZED   Final    Special Requests NONE   Final    Culture  Setup Time [id number]   Final    Colony Count NO GROWTH   Final    Culture NO GROWTH   Final    Report Status 10/14/2011 FINAL   Final   CULTURE, BLOOD (ROUTINE X 2)     Status: Normal (Preliminary result)   Collection Time   10/17/11  9:15 AM      Component Value Range Status Comment   Specimen Description BLOOD RIGHT HAND   Final    Special Requests BOTTLES DRAWN AEROBIC ONLY 10CC   Final    Culture  Setup Time [id number]   Final    Culture     Final    Value:        BLOOD CULTURE RECEIVED NO GROWTH TO DATE CULTURE WILL BE HELD FOR 5 DAYS BEFORE ISSUING A FINAL NEGATIVE REPORT   Report Status PENDING   Incomplete   CULTURE, BLOOD (ROUTINE X 2)     Status: Normal (Preliminary result)   Collection Time   10/17/11  9:25  AM      Component Value Range Status Comment   Specimen Description BLOOD LEFT HAND   Final    Special Requests BOTTLES DRAWN AEROBIC ONLY [facility]   Final    Culture  Setup Time [id number]   Final    Culture     Final    Value:        BLOOD CULTURE RECEIVED NO GROWTH TO DATE CULTURE WILL BE HELD FOR 5 DAYS BEFORE ISSUING A FINAL NEGATIVE REPORT   Report Status PENDING   Incomplete     Studies/Results: No results found.    Assessment/Plan: [name] is a 78 y.o. female admtited withseptic physiology and confusing micro findings  Inititally. Data showing CNS bacteremia that is persistent and concerning for septic thrombophlebitis and endovascular infection. She also grew PCN sensitive Strep BOVIS species from 2/2 admission cultures, and a Clostridium perfringens species from 1/2 cultures  1) Polymicrobial bactermia  And persistent CNS bacteremia with possible septic thrombophebitis:   --continue vancomycin --add rocephin for better cidal activity vs Streptococcus bovis --add flagyl orally -- TEE is pending given persistently positive blood cultures --would treat for 6 weeks minimum  2) Streptococcus bovis bactermia: Can be a marker for Colon cancer. Should inquire re most recent colonoscopy    LOS: 8 days   Acey Lav 10/20/2011, 6:15 PM

## 2011-10-20 NOTE — Progress Notes (Signed)
Patient ID: [name]  female  WUJ:[id number]    DOB: 1931-12-31    DOA: 10/12/2011  PCP: [name], MD, MD  Interim history she has history of perforated duodenal ulcer, status post antrectomy, duodenal resection, Billroth 2 reconstruction, omental patching of Duodenal bulb, open cholecystectomy, gastrostomy and jejunostomy on 05/07/11. Her course was complicated by epigastric abscess 05/31/11. 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 Brought to the ER on 4/15 w/ fever and purulent discharge from PICC insertion site, abd pain and UA concerning for UTI. She was started on broad spectrum abx, ID has been consulted. Blood cultures grew coagulase negative staph and Streptococcus alactolyticus, she also had e coli growing from urine. Repeat blood cultures on 10/17/2011 has been negative so far. At this time the right picc has been removed and she is getting TNA wire peripheral IV. She also has central line in place. Patient has been getting IV fentanyl when necessary for pain.    Subjective: Alert and awake, appears to be pleasantly confused, complaining of abdominal pain throughout the night (confirmed by RN), PEG tube to intermittent wall suction, on TNA  Objective: Weight change:   Intake/Output Summary (Last 24 hours) at 10/20/11 1031 Last data filed at 10/20/11 0606  Gross per 24 hour  Intake      0 ml  Output   1500 ml  Net  -1500 ml   Blood pressure 158/86, pulse 100, temperature 99 F (37.2 C), temperature source Oral, resp. rate 20, height 5\' 5"  (1.651 m), weight 80.5 kg (177 lb 7.5 oz), SpO2 96.00%.  Physical Exam: General: Alert and awake, not in any acute distress. HEENT: anicteric sclera, pupils reactive to light and accommodation, EOMI CVS: S1-S2 clear, no murmur rubs or gallops Chest: clear to auscultation bilaterally, no wheezing, rales or rhonchi Abdomen: soft nontender, PEG tube to intermittent wall  suction Extremities: no cyanosis, clubbing or edema noted bilaterally   Lab Results: Basic Metabolic Panel:  Lab 10/19/11 9562 10/18/11 0650  NA 130* 131*  K 3.7 3.7  CL 100 97  CO2 22 23  GLUCOSE 126* 130*  BUN 13 11  CREATININE 0.54 0.53  CALCIUM 8.5 8.5  MG 1.7 --  PHOS 4.2 --   Liver Function Tests:  Lab 10/19/11 1125 10/15/11 0500  AST 10 20  ALT 10 40*  ALKPHOS 89 119*  BILITOT 0.2* 0.4  PROT 6.0 5.7*  ALBUMIN 1.9* 1.9*    Lab 10/13/11 1630  LIPASE 18  AMYLASE --   No results found for this basename: AMMONIA:2 in the last 168 hours CBC:  Lab 10/19/11 1125 10/18/11 0650  WBC 5.8 6.2  NEUTROABS 3.3 --  HGB 8.0* 8.4*  HCT 23.7* 25.5*  MCV 79.0 79.2  PLT 247 269   Cardiac Enzymes:  Lab 10/13/11 1630  CKTOTAL 213*  CKMB 2.4  CKMBINDEX --  TROPONINI <0.30   BNP: No components found with this basename: POCBNP:2 CBG:  Lab 10/20/11 0741 10/20/11 0354 10/19/11 2353 10/19/11 2106 10/19/11 1656  GLUCAP 142* 135* 127* 132* 150*     Micro Results: Recent Results (from the past 240 hour(s))  URINE CULTURE     Status: Normal   Collection Time   10/12/11  7:52 PM      Component Value Range Status Comment   Specimen Description URINE, CATHETERIZED   Final    Special Requests ADDED 10/12/11 2017   Final    Culture  Setup  Time [id number]   Final    Colony Count >=100,000 COLONIES/ML   Final    Culture ESCHERICHIA COLI   Final    Report Status 10/15/2011 FINAL   Final    Organism ID, Bacteria ESCHERICHIA COLI   Final   CULTURE, BLOOD (ROUTINE X 2)     Status: Normal   Collection Time   10/12/11  8:00 PM      Component Value Range Status Comment   Specimen Description BLOOD HAND LEFT   Final    Special Requests     Final    Value: BOTTLES DRAWN AEROBIC AND ANAEROBIC AERO 10CC, ANAE 5CC   Culture  Setup Time     Final    Value: [id number] This organism DOES NOT demonstrate inducible Clindamycin resistance in vitro.   Culture     Final    Value:  STREPTOCOCCUS SPECIES     STAPHYLOCOCCUS SPECIES (COAGULASE NEGATIVE)     Note: Identified as Streptococcus alactolyticus     Note: Gram Stain Report Called to,Read Back By and Verified With: CHRISTY FRALEY @1132  10/13/11 BY KRAWS   Report Status 10/17/2011 FINAL   Final    Organism ID, Bacteria STAPHYLOCOCCUS SPECIES (COAGULASE NEGATIVE)   Final    Organism ID, Bacteria STREPTOCOCCUS SPECIES   Final   CULTURE, BLOOD (ROUTINE X 2)     Status: Normal   Collection Time   10/12/11  8:13 PM      Component Value Range Status Comment   Specimen Description BLOOD HAND RIGHT   Final    Special Requests BOTTLES DRAWN AEROBIC ONLY 10CC   Final    Culture  Setup Time [id number]   Final    Culture     Final    Value: STREPTOCOCCUS SPECIES     STAPHYLOCOCCUS SPECIES (COAGULASE NEGATIVE)     Note: Identified as Streptococcus alactolyticus SUSCEPTIBILITIES PERFORMED ON PREVIOUS CULTURE WITHIN THE LAST 5 DAYS.     Note: Gram Stain Report Called to,Read Back By and Verified With: LAUREN CONNALLY @ 1828 ON 10/13/11 BY GOLLD   Report Status 10/17/2011 FINAL   Final   CATH TIP CULTURE     Status: Normal   Collection Time   10/13/11  3:47 AM      Component Value Range Status Comment   Specimen Description CATH TIP   Final    Special Requests NONE   Final    Culture     Final    Value: >100 COLONIES STAPHYLOCOCCUS SPECIES (COAGULASE NEGATIVE)     Note: RIFAMPIN AND GENTAMICIN SHOULD NOT BE USED AS SINGLE DRUGS FOR TREATMENT OF STAPH INFECTIONS. This organism DOES NOT demonstrate inducible Clindamycin resistance in vitro.   Report Status 10/16/2011 FINAL   Final    Organism ID, Bacteria STAPHYLOCOCCUS SPECIES (COAGULASE NEGATIVE)   Final   MRSA PCR SCREENING     Status: Normal   Collection Time   10/13/11  3:51 AM      Component Value Range Status Comment   MRSA by PCR NEGATIVE  NEGATIVE  Final   MRSA PCR SCREENING     Status: Normal   Collection Time   10/13/11  3:35 PM      Component Value Range Status  Comment   MRSA by PCR NEGATIVE  NEGATIVE  Final   CULTURE, BLOOD (ROUTINE X 2)     Status: Normal   Collection Time   10/13/11  5:00 PM      Component Value Range Status Comment  Specimen Description BLOOD RIGHT ARM   Final    Special Requests BOTTLES DRAWN AEROBIC ONLY 10CC   Final    Culture  Setup Time [id number]   Final    Culture     Final    Value: STAPHYLOCOCCUS SPECIES (COAGULASE NEGATIVE)     Note: SUSCEPTIBILITIES PERFORMED ON PREVIOUS CULTURE WITHIN THE LAST 5 DAYS.     Note: Gram Stain Report Called to,Read Back By and Verified With: MARK SIVNGE @0436  ON 10/16/11 BY MCLET   Report Status 10/17/2011 FINAL   Final   CULTURE, BLOOD (ROUTINE X 2)     Status: Normal   Collection Time   10/13/11  5:08 PM      Component Value Range Status Comment   Specimen Description BLOOD RIGHT HAND   Final    Special Requests BOTTLES DRAWN AEROBIC ONLY 5CC   Final    Culture  Setup Time [id number]   Final    Culture     Final    Value: STAPHYLOCOCCUS SPECIES (COAGULASE NEGATIVE)     Note: SUSCEPTIBILITIES PERFORMED ON PREVIOUS CULTURE WITHIN THE LAST 5 DAYS.     Note: Gram Stain Report Called to,Read Back By and Verified With: ASHLEY HARRISON @0627  ON 10/15/11 BY MCLET   Report Status 10/16/2011 FINAL   Final   URINE CULTURE     Status: Normal   Collection Time   10/13/11  5:12 PM      Component Value Range Status Comment   Specimen Description URINE, CATHETERIZED   Final    Special Requests NONE   Final    Culture  Setup Time [id number]   Final    Colony Count NO GROWTH   Final    Culture NO GROWTH   Final    Report Status 10/14/2011 FINAL   Final   CULTURE, BLOOD (ROUTINE X 2)     Status: Normal (Preliminary result)   Collection Time   10/17/11  9:15 AM      Component Value Range Status Comment   Specimen Description BLOOD RIGHT HAND   Final    Special Requests BOTTLES DRAWN AEROBIC ONLY 10CC   Final    Culture  Setup Time [id number]   Final    Culture     Final    Value:         BLOOD CULTURE RECEIVED NO GROWTH TO DATE CULTURE WILL BE HELD FOR 5 DAYS BEFORE ISSUING A FINAL NEGATIVE REPORT   Report Status PENDING   Incomplete   CULTURE, BLOOD (ROUTINE X 2)     Status: Normal (Preliminary result)   Collection Time   10/17/11  9:25 AM      Component Value Range Status Comment   Specimen Description BLOOD LEFT HAND   Final    Special Requests BOTTLES DRAWN AEROBIC ONLY [facility]   Final    Culture  Setup Time [id number]   Final    Culture     Final    Value:        BLOOD CULTURE RECEIVED NO GROWTH TO DATE CULTURE WILL BE HELD FOR 5 DAYS BEFORE ISSUING A FINAL NEGATIVE REPORT   Report Status PENDING   Incomplete     Studies/Results: Ct Humerus Right W Contrast  10/16/2011  *RADIOLOGY REPORT*  Clinical Data: Upper extremity venous thrombosis.  Redness about the elbow.  Pain.  CT OF THE RIGHT HUMERUS WITH CONTRAST  Technique:  Multidetector CT imaging was performed following the standard protocol during bolus administration  of intravenous contrast.  Contrast: 80mL OMNIPAQUE IOHEXOL 300 MG/ML  SOLN  Comparison: Plain films of the elbow 12/09/2007. Report of upper extremity ultrasound 10/14/2011 reviewed.  Findings: As described report of comparison ultrasound, clot is seen in the left subclavian, axillary and brachial veins. Subclavian vein is not well demonstrated on this examination as it is not scanned in its entirety.  Clot is well demonstrated extending to a point 18 cm below the top of the humeral head. Extensive subcutaneous edema is present.  There is no focal fluid collection to suggest abscess.  No focal bony abnormality is identified.  There is some dependent atelectasis in the right lung. Imaged intra-abdominal contents are unremarkable.  IMPRESSION: Clot in the left subclavian, axillary and brachial veins as described above with associated subcutaneous edema.  Negative for abscess.  Original Report Authenticated By: Bernadene Bell. Maricela Curet, M.D.   Ct Abdomen Pelvis W  Contrast  10/13/2011  *RADIOLOGY REPORT*  Clinical Data: 78 year old with sepsis.  CT ABDOMEN AND PELVIS WITH CONTRAST  Technique:  Multidetector CT imaging of the abdomen and pelvis was performed following the standard protocol during bolus administration of intravenous contrast.  Contrast: 80mL OMNIPAQUE IOHEXOL 300 MG/ML  SOLN  Comparison: 07/29/2011  Findings: There are tiny bilateral pleural effusions with basilar atelectasis.  A small amount of focal atelectasis or round atelectasis at the right lung base on sequence 3, image 7.  No evidence for free air.  Coronary artery calcifications.  The gallbladder has been removed. Again noted is low density fluid or postsurgical changes between the stomach and left hepatic lobe, best seen on sequence 2, image 23.  This area roughly measures 2.1 cm and similar to the prior examination.  There is residual fluid or edema in the gallbladder fossa and along the inferior liver margin.  No evidence for a drainable fluid collection.  Gastrostomy tube is present.  Again noted is a large calcification near the pancreatic head and neck. There is oral contrast within the stomach and large bowel.  No gross abnormality to the liver, portal venous system, adrenal glands, kidneys or spleen.  Again noted are low density structures within the kidneys which are too small to definitively characterize but could represent cysts.  Calcifications associated with the uterus and cannot exclude fibroids.  No significant free fluid in the pelvis.  There is a right hip replacement.  No significant lymphadenopathy. A Foley catheter within the urinary bladder.  IMPRESSION:  Postsurgical changes in the upper abdomen.  Residual low density fluid or edema around the liver has minimally changed since 07/29/2011.  Tiny pleural effusions with bibasilar atelectasis.  Original Report Authenticated By: Richarda Overlie, M.D.   Ir Fluoro Guide Cv Line Right  09/29/2011  *RADIOLOGY REPORT*  Clinical Data: Diabetes   PICC LINE PLACEMENT WITH ULTRASOUND AND FLUOROSCOPIC  GUIDANCE  Fluoroscopy Time: 0.5 minutes.  The right arm was prepped with chlorhexidine, draped in the usual sterile fashion using maximum barrier technique (cap and mask, sterile gown, sterile gloves, large sterile sheet, hand hygiene and cutaneous antisepsis) and infiltrated locally with 1% Lidocaine.  Ultrasound demonstrated patency of the right basilic vein, and this was documented with an image.  Under real-time ultrasound guidance, this vein was accessed with a 21 gauge micropuncture needle and image documentation was performed.  The needle was exchanged over a guidewire for a peel-away sheath through which a five [place] double lumen PICC trimmed to 40 cm was advanced, positioned with its tip at the lower SVC/right atrial  junction.  Fluoroscopy during the procedure and fluoro spot radiograph confirms appropriate catheter position.  The catheter was flushed, secured to the skin with Prolene sutures, and covered with a sterile dressing.  Complications:  None  IMPRESSION: Successful right arm PICC line placement with ultrasound and fluoroscopic guidance.  The catheter is ready for use.  Original Report Authenticated By: Donavan Burnet, M.D.   Ir US Guide Vasc Access Right  09/29/2011  *RADIOLOGY REPORT*  Clinical Data: Diabetes  PICC LINE PLACEMENT WITH ULTRASOUND AND FLUOROSCOPIC  GUIDANCE  Fluoroscopy Time: 0.5 minutes.  The right arm was prepped with chlorhexidine, draped in the usual sterile fashion using maximum barrier technique (cap and mask, sterile gown, sterile gloves, large sterile sheet, hand hygiene and cutaneous antisepsis) and infiltrated locally with 1% Lidocaine.  Ultrasound demonstrated patency of the right basilic vein, and this was documented with an image.  Under real-time ultrasound guidance, this vein was accessed with a 21 gauge micropuncture needle and image documentation was performed.  The needle was exchanged over a guidewire for  a peel-away sheath through which a five [place] double lumen PICC trimmed to 40 cm was advanced, positioned with its tip at the lower SVC/right atrial junction.  Fluoroscopy during the procedure and fluoro spot radiograph confirms appropriate catheter position.  The catheter was flushed, secured to the skin with Prolene sutures, and covered with a sterile dressing.  Complications:  None  IMPRESSION: Successful right arm PICC line placement with ultrasound and fluoroscopic guidance.  The catheter is ready for use.  Original Report Authenticated By: Donavan Burnet, M.D.   Dg Chest Port 1 View  10/13/2011  *RADIOLOGY REPORT*  Clinical Data: Line placement.  PORTABLE CHEST - 1 VIEW  Comparison: 10/12/2011  Findings: Cardiomegaly with vascular congestion.  Diffuse interstitial prominence may reflect early interstitial edema.  No confluent opacities or effusions.  No acute bony abnormality.  Left central line is in place.  The tip is in the upper right atrium.  No pneumothorax.  IMPRESSION: Cardiomegaly, vascular congestion.  Question early interstitial edema.  Original Report Authenticated By: Cyndie Chime, M.D.   Dg Chest Port 1 View  10/12/2011  *RADIOLOGY REPORT*  Clinical Data: Fever.  Drainage about a PICC.  PORTABLE CHEST - 1 VIEW  Comparison: Fluoroscopic spot view from PICC placement 09/29/2011. Plain films of the chest 07/28/2011.  CT chest 01/26/2011.  Findings: The patient's right PICC has backed out with the tip in the right axilla.  Lungs are clear.  Heart size is normal.  No pneumothorax or pleural effusion.  IMPRESSION:  1.  Tip of the patient's right PICC is now in the right axilla. 2.  No acute cardiopulmonary disease.  Original Report Authenticated By: Bernadene Bell. Maricela Curet, M.D.   Dg Abd Portable 2v  10/13/2011  *RADIOLOGY REPORT*  Clinical Data: Abdominal pain.  PORTABLE ABDOMEN - 2 VIEW  Comparison: CT 10/13/2011  Findings: Oral contrast material seen within the colon.  There is a  nonobstructive bowel gas pattern.  No free air on the decubitus views.  No organomegaly.  IMPRESSION: No obstruction or free air.  Original Report Authenticated By: Cyndie Chime, M.D.    Medications: Scheduled Meds:    . fentaNYL  25 mcg Transdermal Q72H  . fentaNYL  25 mcg Transdermal Once  . insulin aspart  0-9 Units Subcutaneous Q4H  . pantoprazole (PROTONIX) IV  40 mg Intravenous Q24H  . sodium chloride  10-40 mL Intracatheter Q12H  . vancomycin  1,250  mg Intravenous Q12H   Continuous Infusions:    . sodium chloride 20 mL/hr at 10/14/11 1113  . TPN (CLINIMIX) +/- additives 80 mL/hr at 10/18/11 1731   And  . fat emulsion 250 mL (10/18/11 1732)  . TPN (CLINIMIX) +/- additives 80 mL/hr at 10/19/11 1742   And  . fat emulsion 250 mL (10/19/11 1742)  . TPN (CLINIMIX) +/- additives     And  . fat emulsion    . heparin 1,850 Units/hr (10/19/11 1954)     Assessment/Plan:  Sepsis, Coag negative staph and strept Bacteremia:, Continue vancomycin for coag neg staph and strept  ID is following, reviewed Dr. Lattie Haw recommendations  - I have called cardiology consultation for TEE given persistently positive blood cultures and low-grade fevers (with the possibility of endo carditis, septic thrombophlebitis)  DIABETES MELLITUS, TYPE ZO:XWRU SSI , stable  Hyponatremia: Stable for now, Sec to GI losses.   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   Dementia; pleasantly confused    DVT Prophylaxis: On heparin drip  Code  Status: Full code  Disposition: Not medically ready, if no significant improvement in next few days, palliative medicine consult for goals of care should be considered.   LOS: 8 days   Asanti Craigo M.D. [facility] 10/20/2011, 10:31 AM Pager: [phone]

## 2011-10-20 NOTE — Progress Notes (Signed)
10/20/2011 Khaden Gater Elizabeth PTA [phone] pager [phone] office    

## 2011-10-20 NOTE — Progress Notes (Signed)
ANTICOAGULATION CONSULT NOTE - Follow Up Consult  Pharmacy Consult for Heparin Indication: DVT at site of PICC  Allergies  Allergen Reactions  . Other Swelling    Pinto beans cause swelling and hives  . Penicillins Swelling    Patient Measurements: Height: 5\' 5"  (165.1 cm) Weight: 177 lb 7.5 oz (80.5 kg) IBW/kg (Calculated) : 57  Heparin Dosing Weight: 75 kg  Vital Signs: Temp: 99 F (37.2 C) (04/23 0605) Temp src: Oral (04/23 0605) BP: 158/86 mmHg (04/23 0605) Pulse Rate: 100  (04/23 0605)  Labs:  Basename 10/20/11 1230 10/19/11 1125 10/18/11 0650  HGB 9.1* 8.0* --  HCT 27.4* 23.7* 25.5*  PLT 287 247 269  APTT -- -- --  LABPROT -- -- --  INR -- -- --  HEPARINUNFRC 0.75* 0.56 0.38  CREATININE -- 0.54 0.53  CKTOTAL -- -- --  CKMB -- -- --  TROPONINI -- -- --   Estimated Creatinine Clearance: 60.8 ml/min (by C-G formula based on Cr of 0.54).   Medications:  Infusions:     . sodium chloride 500 mL (10/20/11 1127)  . TPN (CLINIMIX) +/- additives 80 mL/hr at 10/18/11 1731   And  . fat emulsion 250 mL (10/18/11 1732)  . TPN (CLINIMIX) +/- additives 80 mL/hr at 10/19/11 1742   And  . fat emulsion 250 mL (10/19/11 1742)  . TPN (CLINIMIX) +/- additives     And  . fat emulsion    . heparin 1,850 Units/hr (10/20/11 1128)    Assessment: 78 yo female with new upper extremity DVT at PICC site. Difficulty obtaining blood for labs this AM.  Heparin level = 0.75 on 1850 units/hr . No bleeding noted . Hgb increased to 9.1. No bleeding reported.   Goal of Therapy:  Heparin level 0.3-0.7 units/ml   Plan:  1. Decrease heparin to 1700 units/hr 2. Will follow daily heparin level and CBC 3. Plan for long-term po anticoagulation with short gut?  Arman Filter 10/20/2011,1:57 PM

## 2011-10-20 NOTE — Progress Notes (Signed)
Physical Therapy Treatment [name] MRN: [id number] DOB: Oct 06, 1931 Today's Date: 10/20/2011 Time:  -     PT Assessment / Plan / Recommendation Comments on Treatment Session  Pt was able to increase activity but not able to amb due to fatigue. Pt required VC's to correct breathing technique when pt felt lightheaded during transfers and therex.     Follow Up Recommendations  Skilled nursing facility    Equipment Recommendations  Defer to next venue    Frequency Min 2X/week   Plan      Precautions / Restrictions Precautions Precautions: Fall Restrictions Weight Bearing Restrictions: No   Pertinent Vitals/Pain     Mobility  Bed Mobility Bed Mobility: Rolling Right;Right Sidelying to Sit;Sitting - Scoot to [place] of Bed Rolling Right: 4: Min assist;With rail (assistance with trunk/legs) Right Sidelying to Sit: 4: Min assist;With rails;HOB flat (assistance with trunk/legs) Sitting - Scoot to Edge of Bed: 4: Min guard Details for Bed Mobility Assistance: Pt needed assistance with LE to initate log roll and to sitting postition.  Transfers Transfers: Sit to Stand;Stand to Sit;Stand Pivot Transfers Sit to Stand: 1: +2 Total assist;With upper extremity assist;From bed (+2 for safety due to no RW) Sit to Stand: Patient Percentage: 80% Stand to Sit: 3: Mod assist;With upper extremity assist;1: +2 Total assist (pt fatigued before descent ) Stand to Sit: Patient Percentage: 50% (pt fatigued before descent/ +2 A due to no RW ) Stand Pivot Transfers: 3: Mod assist;1: +2 Total assist Stand Pivot Transfers: Patient Percentage: 50% (Pt fatigued before descent/ +2 A due to no RW) Details for Transfer Assistance: Pt required 2+ assistance due to no RW in room. Pt needed assistance to control descent due to fatigue when attempting to amb.    Exercises General Exercises - Lower Extremity Long Arc Quad: AAROM;Strengthening;10 reps;Limitations (2x5 ) Long Arc Quad  Limitations: decreased B knee ext Hip Flexion/Marching: Strengthening;10 reps;Other reps (comment) (B 2x10 )   PT Goals Acute Rehab PT Goals PT Goal: Supine/Side to Sit - Progress: Progressing toward goal PT Goal: Sit to Stand - Progress: Progressing toward goal PT Goal: Stand to Sit - Progress: Progressing toward goal  Visit Information  Last PT Received On: 10/20/11 Assistance Needed: +2    Subjective Data  Subjective: pt c/o being cold    Cognition  Overall Cognitive Status: History of cognitive impairments - at baseline Behavior During Session: [facility] for tasks performed    Balance     End of Session PT - End of Session Activity Tolerance: Patient tolerated treatment well;Patient limited by fatigue Patient left: in chair;with call bell/phone within reach Nurse Communication: Mobility status    [name] 10/20/2011, 12:30 PM

## 2011-10-20 NOTE — Progress Notes (Signed)
PARENTERAL NUTRITION CONSULT NOTE - FOLLOW UP  Pharmacy Consult for TPN Indication: chronic TPN at home for short - gut syndrome  Allergies  Allergen Reactions  . Other Swelling    Pinto beans cause swelling and hives  . Penicillins Swelling    Patient Measurements: Height: 5\' 5"  (165.1 cm) Weight: 177 lb 7.5 oz (80.5 kg) IBW/kg (Calculated) : 57  Adjusted Body Weight:  Usual Weight: 162 lbs  Vital Signs: Temp: 99 F (37.2 C) (04/23 0605) Temp src: Oral (04/23 0605) BP: 158/86 mmHg (04/23 0605) Pulse Rate: 100  (04/23 0605) Intake/Output from previous day: 04/22 0701 - 04/23 0700 In: -  Out: 1500 [Urine:1500] Intake/Output from this shift:    Labs:  [facility] 10/19/11 1125 10/18/11 0650 10/17/11 0858  WBC 5.8 6.2 9.2  HGB 8.0* 8.4* 9.0*  HCT 23.7* 25.5* 26.9*  PLT 247 269 215  APTT -- -- --  INR -- -- --     Basename 10/19/11 1125 10/18/11 0650 10/17/11 0858  NA 130* 131* 128*  K 3.7 3.7 3.3*  CL 100 97 95*  CO2 22 23 23   GLUCOSE 126* 130* 130*  BUN 13 11 8   CREATININE 0.54 0.53 0.49*  LABCREA -- -- --  CREAT24HRUR -- -- --  CALCIUM 8.5 8.5 8.4  MG 1.7 1.8 --  PHOS 4.2 -- --  PROT 6.0 -- --  ALBUMIN 1.9* -- --  AST 10 -- --  ALT 10 -- --  ALKPHOS 89 -- --  BILITOT 0.2* -- --  BILIDIR -- -- --  IBILI -- -- --  PREALBUMIN 8.2* -- --  TRIG 82 -- --  CHOLHDL -- -- --  CHOL 88 -- --   Estimated Creatinine Clearance: 60.8 ml/min (by C-G formula based on Cr of 0.54).    Basename 10/20/11 0354 10/19/11 2353 10/19/11 2106  GLUCAP 135* 127* 132*    Medical History: Past Medical History  Diagnosis Date  . Hypertension   . Reflux   . Right elbow pain     OTIF  . Dementia   . Depression   . Osteoarthritis   . Pancreatitis 11/2007    HOP  . Angiomyolipoma of kidney     right  . Ulcerative esophagitis 12/05/2007    hx elevated gastrin, severe on EGD by Dr Jena Gauss , h pylori negative  . Hiatal hernia   . S/P colonoscopy 2009    pt reports  normal by Dr Lovell Sheehan  . Diabetes mellitus   . Anemia   . Hyponatremia   . Cellulitis   . Esophagitis   . Peptic ulcer   . Insomnia     Medications:  Prescriptions prior to admission  Medication Sig Dispense Refill  . acetaminophen (TYLENOL) 500 MG tablet Take 500 mg by mouth daily as needed. For pain      . amLODipine (NORVASC) 5 MG tablet Take 5 mg by mouth daily.      . benazepril (LOTENSIN) 20 MG tablet Take 20 mg by mouth daily.       . butalbital-acetaminophen-caffeine (FIORICET, ESGIC) 50-325-40 MG per tablet Take 2 tablets by mouth every 8 (eight) hours as needed. For headache      . calcium-vitamin D (OSCAL WITH D) 500-200 MG-UNIT per tablet Take 1 tablet by mouth daily.       . cloNIDine (CATAPRES - DOSED IN MG/24 HR) 0.1 mg/24hr patch Place 1 patch onto the skin once a week. Change on Mondays      .  darifenacin (ENABLEX) 15 MG 24 hr tablet Take 15 mg by mouth daily.       . ergocalciferol (VITAMIN D2) 50000 UNITS capsule Take 50,000 Units by mouth once a week.      . insulin aspart (NOVOLOG) 100 UNIT/ML injection Inject 3 Units into the skin 3 (three) times daily before meals. Sliding scale-give 3 units sq for CBG >150      . insulin regular (NOVOLIN R,HUMULIN R) 100 units/mL injection Inject 65 Units into the skin daily.      . Loperamide HCl (IMODIUM PO) Take 2 mg by mouth every 4 (four) hours as needed. For diarrhea      . metFORMIN (GLUCOPHAGE) 1000 MG tablet Take 1,000 mg by mouth daily with breakfast.       . metoprolol tartrate (LOPRESSOR) 12.5 mg TABS Take 12.5 mg by mouth 2 (two) times daily.      . Multiple Vitamin (MULITIVITAMIN WITH MINERALS) TABS Take 1 tablet by mouth daily.      . ondansetron (ZOFRAN) 4 MG tablet Take 4 mg by mouth every 8 (eight) hours as needed. For nausea       . oxyCODONE (ROXICODONE) 5 MG/5ML solution Take 5 mg by mouth every 4 (four) hours as needed. For pain      . pantoprazole (PROTONIX) 40 MG tablet Take 40 mg by mouth 2 (two) times  daily before a meal.      . PRESCRIPTION MEDICATION Inject into the vein continuous. Home TPN.  Patient gets supply from [facility] [phone]).      . QUEtiapine (SEROQUEL) 25 MG tablet Take 25 mg by mouth at bedtime.       . traZODone (DESYREL) 50 MG tablet Take 25-50 mg by mouth at bedtime as needed. For sleep        Insulin Requirements in the past 24 hours:  4 units Novolog SSI and 50 unit regular insulin in TPN  Current Nutrition: Ice chips   Nutritional Goals:  1650-1850 kCal, 85-100 grams of protein per day   Assessment:  [name] admitted from [facility] where she is on chronic TPN and takes POs only for pleasure.   PICC line placed in left arm.  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 NS at [facility] Pulm: 98% on RA Cards: hx HTN - BP mildly elevated, HR 100-105, off pressors.  Heparin gtt for new DVT Hepatobil: LFTs WNL - monitor Neuro: hx depression (seroquel / trazodone PTA), some confusion ID:  Vanc D#7 for septic thrombophlebitis (r/o IE) and CRBSI with CoNS, E.coli UTI, Tmax 100.6, WBC WNL Best Practices: PPI IV, heparin gtt   Plan:  Continue Clinimix E5/15 at 80 ml/hr with lipids at 8 ml/hr, to closely match home formula and meeting 100% of patient's needs  - MVI and TE MWF only due to national shortage  - Monitor I/O's, lytes   Talbert Cage, PharmD 10/20/2011, 7:38 AM

## 2011-10-20 NOTE — Consult Note (Signed)
IOWA KAPPES [birth year]  [id number].   Requesting MD: Dr. Isidoro Donning Chief Complaint/Reason for Consult: rule out PEG tube infection HPI: This is a 78 yo female who is well known to [place] for her past surgical history.  She also had a bacteremia secondary to a PICC infection earlier this year that she was treated for.    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  This has left her with a functional short gut syndrome.   She is on chronic TNA and allowed a diet for palliation.  Her G-tube is normally clamped and not to suction.   The patient is really unable to provide a history.  Apparently, she was brought to the ED for fevers.  She was found to have bacteremia, likely secondary to her PICC, as well as a UTI.  She did complain of some abdominal pain, but her CT was negative.  We have been asked to see for further recommendations and not make sure she does not have an infection around her g-tube site.  Review of systems: Please see HPI, otherwise all other systems are currently negative.  She currently denies abdominal pain, nausea, vomiting, or diarrhea.  Family History  Problem Relation Age of Onset  . Cancer Mother     pelvic     Past Medical History  Diagnosis Date  . Hypertension   . Reflux   . Right elbow pain     OTIF  . Dementia   . Depression   . Osteoarthritis   . Pancreatitis 11/2007    HOP  . Angiomyolipoma of kidney     right  . Ulcerative esophagitis 12/05/2007    hx elevated gastrin, severe on EGD by Dr Jena Gauss , h pylori negative  . Hiatal hernia   . S/P colonoscopy 2009    pt reports normal by Dr Lovell Sheehan  . Diabetes mellitus   . Anemia   . Hyponatremia   . Cellulitis   . Esophagitis   . Peptic ulcer   . Insomnia     Past Surgical History  Procedure Date  . Orif right hip 1999    [facility]  . Umbilical hernia repair 75 months old    [place]  . Esophagogastroduodenoscopy 01/28/2011    Procedure:  ESOPHAGOGASTRODUODENOSCOPY (EGD);  Surgeon: Arlyce Harman, MD;  Location: [facility]ITE;  Service: Endoscopy;  Laterality: N/A;  . Colonoscopy 01/28/2011    Procedure: COLONOSCOPY;  Surgeon: Arlyce Harman, MD;  Location: [facility]ITE;  Service: Endoscopy;  Laterality: N/A;  . Laparotomy 02/04/2011    Procedure: EXPLORATORY LAPAROTOMY;  Surgeon: Fabio Bering;  Location: [facility] ORS;  Service: General;  Laterality: N/A;  . Laparotomy 05/07/2011    Procedure: EXPLORATORY LAPAROTOMY;  Surgeon: Ardeth Sportsman, MD;  Location: [facility] OR;  Service: General;  Laterality: N/A;  lysis of adhesions  . Gastrostomy 05/07/2011    Procedure: GASTROSTOMY;  Surgeon: Ardeth Sportsman, MD;  Location: [facility] OR;  Service: General;  Laterality: N/A;  g-tube / j -tube placement  . Gastrectomy 05/07/2011    Procedure: GASTRECTOMY;  Surgeon: Ardeth Sportsman, MD;  Location: [facility] OR;  Service: General;  Laterality: N/A;  Partial gastrectomy  . Cholecystectomy 05/07/2011    Procedure: CHOLECYSTECTOMY;  Surgeon: Ardeth Sportsman, MD;  Location: [facility] OR;  Service: General;  Laterality: N/A;  open    Social History:  reports that she has never smoked. Her smokeless tobacco use  includes Chew. She reports that she does not drink alcohol or use illicit drugs.  Allergies:  Allergies  Allergen Reactions  . Other Swelling    Pinto beans cause swelling and hives  . Penicillins Swelling    Medications Prior to Admission  Medication Sig Dispense Refill  . acetaminophen (TYLENOL) 500 MG tablet Take 500 mg by mouth daily as needed. For pain      . amLODipine (NORVASC) 5 MG tablet Take 5 mg by mouth daily.      . benazepril (LOTENSIN) 20 MG tablet Take 20 mg by mouth daily.       . butalbital-acetaminophen-caffeine (FIORICET, ESGIC) 50-325-40 MG per tablet Take 2 tablets by mouth every 8 (eight) hours as needed. For headache      . calcium-vitamin D (OSCAL WITH D) 500-200 MG-UNIT per tablet Take 1 tablet by mouth daily.       . cloNIDine  (CATAPRES - DOSED IN MG/24 HR) 0.1 mg/24hr patch Place 1 patch onto the skin once a week. Change on Mondays      . darifenacin (ENABLEX) 15 MG 24 hr tablet Take 15 mg by mouth daily.       . ergocalciferol (VITAMIN D2) 50000 UNITS capsule Take 50,000 Units by mouth once a week.      . insulin aspart (NOVOLOG) 100 UNIT/ML injection Inject 3 Units into the skin 3 (three) times daily before meals. Sliding scale-give 3 units sq for CBG >150      . insulin regular (NOVOLIN R,HUMULIN R) 100 units/mL injection Inject 65 Units into the skin daily.      . Loperamide HCl (IMODIUM PO) Take 2 mg by mouth every 4 (four) hours as needed. For diarrhea      . metFORMIN (GLUCOPHAGE) 1000 MG tablet Take 1,000 mg by mouth daily with breakfast.       . metoprolol tartrate (LOPRESSOR) 12.5 mg TABS Take 12.5 mg by mouth 2 (two) times daily.      . Multiple Vitamin (MULITIVITAMIN WITH MINERALS) TABS Take 1 tablet by mouth daily.      . ondansetron (ZOFRAN) 4 MG tablet Take 4 mg by mouth every 8 (eight) hours as needed. For nausea       . oxyCODONE (ROXICODONE) 5 MG/5ML solution Take 5 mg by mouth every 4 (four) hours as needed. For pain      . pantoprazole (PROTONIX) 40 MG tablet Take 40 mg by mouth 2 (two) times daily before a meal.      . PRESCRIPTION MEDICATION Inject into the vein continuous. Home TPN.  Patient gets supply from [facility] [phone]).      . QUEtiapine (SEROQUEL) 25 MG tablet Take 25 mg by mouth at bedtime.       . traZODone (DESYREL) 50 MG tablet Take 25-50 mg by mouth at bedtime as needed. For sleep        Blood pressure 158/86, pulse 100, temperature 99 F (37.2 C), temperature source Oral, resp. rate 20, height 5\' 5"  (1.651 m), weight 177 lb 7.5 oz (80.5 kg), SpO2 96.00%. Physical Exam: General: pleasant, obese black female who is laying in bed in NAD HEENT: head is normocephalic, atraumatic.  Sclera are noninjected.  PERRL.  Ears and nose without any masses or lesions.  Mouth is  pink and moist Heart: regular, rate, and rhythm.  Normal s1,s2. No obvious murmurs, gallops, or rubs noted.  Palpable radial and pedal pulses bilaterally Lungs: CTAB, no wheezes, rhonchi, or rales noted.  Respiratory effort nonlabored Abd: soft, NT, ND, +BS, no masses, hernias, or organomegaly.  Her G-tube in place with no drainage, erythema, or evidence of infection.  Along the midline portion of her scar she does have a chronic opening that drains cloudy material.  This is chronic and stable.  No evidence of infection here either. Skin: warm and dry with no masses, lesions, or rashes Psych: Alert and oriented, but unable to provide full details about why she is here.    Results for orders placed during the hospital encounter of 10/12/11 (from the past 48 hour(s))  GLUCOSE, CAPILLARY     Status: Abnormal   Collection Time   10/18/11 11:44 AM      Component Value Range Comment   Glucose-Capillary 143 (*) 70 - 99 (mg/dL)    Comment 1 Notify RN      Comment 2 Documented in Chart     GLUCOSE, CAPILLARY     Status: Abnormal   Collection Time   10/18/11  5:26 PM      Component Value Range Comment   Glucose-Capillary 136 (*) 70 - 99 (mg/dL)    Comment 1 Notify RN      Comment 2 Documented in Chart     GLUCOSE, CAPILLARY     Status: Abnormal   Collection Time   10/18/11  8:35 PM      Component Value Range Comment   Glucose-Capillary 172 (*) 70 - 99 (mg/dL)   GLUCOSE, CAPILLARY     Status: Abnormal   Collection Time   10/19/11 12:09 AM      Component Value Range Comment   Glucose-Capillary 109 (*) 70 - 99 (mg/dL)   GLUCOSE, CAPILLARY     Status: Abnormal   Collection Time   10/19/11  3:59 AM      Component Value Range Comment   Glucose-Capillary 138 (*) 70 - 99 (mg/dL)   GLUCOSE, CAPILLARY     Status: Abnormal   Collection Time   10/19/11  7:39 AM      Component Value Range Comment   Glucose-Capillary 137 (*) 70 - 99 (mg/dL)   COMPREHENSIVE METABOLIC PANEL     Status: Abnormal    Collection Time   10/19/11 11:25 AM      Component Value Range Comment   Sodium 130 (*) 135 - 145 (mEq/L)    Potassium 3.7  3.5 - 5.1 (mEq/L)    Chloride 100  96 - 112 (mEq/L)    CO2 22  19 - 32 (mEq/L)    Glucose, Bld 126 (*) 70 - 99 (mg/dL)    BUN 13  6 - 23 (mg/dL)    Creatinine, Ser 1.61  0.50 - 1.10 (mg/dL)    Calcium 8.5  8.4 - 10.5 (mg/dL)    Total Protein 6.0  6.0 - 8.3 (g/dL)    Albumin 1.9 (*) 3.5 - 5.2 (g/dL)    AST 10  0 - 37 (U/L)    ALT 10  0 - 35 (U/L)    Alkaline Phosphatase 89  39 - 117 (U/L)    Total Bilirubin 0.2 (*) 0.3 - 1.2 (mg/dL)    GFR calc non Af Amer 88 (*) >90 (mL/min)    GFR calc Af Amer >90  >90 (mL/min)   MAGNESIUM     Status: Normal   Collection Time   10/19/11 11:25 AM      Component Value Range Comment   Magnesium 1.7  1.5 - 2.5 (mg/dL)   PHOSPHORUS  Status: Normal   Collection Time   10/19/11 11:25 AM      Component Value Range Comment   Phosphorus 4.2  2.3 - 4.6 (mg/dL)   CBC     Status: Abnormal   Collection Time   10/19/11 11:25 AM      Component Value Range Comment   WBC 5.8  4.0 - 10.5 (K/uL)    RBC 3.00 (*) 3.87 - 5.11 (MIL/uL)    Hemoglobin 8.0 (*) 12.0 - 15.0 (g/dL)    HCT 21.3 (*) 08.6 - 46.0 (%)    MCV 79.0  78.0 - 100.0 (fL)    MCH 26.7  26.0 - 34.0 (pg)    MCHC 33.8  30.0 - 36.0 (g/dL)    RDW 57.8 (*) 46.9 - 15.5 (%)    Platelets 247  150 - 400 (K/uL)   DIFFERENTIAL     Status: Normal   Collection Time   10/19/11 11:25 AM      Component Value Range Comment   Neutrophils Relative 57  43 - 77 (%)    Neutro Abs 3.3  1.7 - 7.7 (K/uL)    Lymphocytes Relative 29  12 - 46 (%)    Lymphs Abs 1.7  0.7 - 4.0 (K/uL)    Monocytes Relative 11  3 - 12 (%)    Monocytes Absolute 0.6  0.1 - 1.0 (K/uL)    Eosinophils Relative 3  0 - 5 (%)    Eosinophils Absolute 0.2  0.0 - 0.7 (K/uL)    Basophils Relative 0  0 - 1 (%)    Basophils Absolute 0.0  0.0 - 0.1 (K/uL)   PREALBUMIN     Status: Abnormal   Collection Time   10/19/11 11:25 AM       Component Value Range Comment   Prealbumin 8.2 (*) 17.0 - 34.0 (mg/dL)   HEPARIN LEVEL (UNFRACTIONATED)     Status: Normal   Collection Time   10/19/11 11:25 AM      Component Value Range Comment   Heparin Unfractionated 0.56  0.30 - 0.70 (IU/mL)   CHOLESTEROL, TOTAL     Status: Normal   Collection Time   10/19/11 11:25 AM      Component Value Range Comment   Cholesterol 88  0 - 200 (mg/dL)   TRIGLYCERIDES     Status: Normal   Collection Time   10/19/11 11:25 AM      Component Value Range Comment   Triglycerides 82  <150 (mg/dL)   GLUCOSE, CAPILLARY     Status: Abnormal   Collection Time   10/19/11 11:35 AM      Component Value Range Comment   Glucose-Capillary 119 (*) 70 - 99 (mg/dL)   GLUCOSE, CAPILLARY     Status: Abnormal   Collection Time   10/19/11  4:56 PM      Component Value Range Comment   Glucose-Capillary 150 (*) 70 - 99 (mg/dL)   GLUCOSE, CAPILLARY     Status: Abnormal   Collection Time   10/19/11  9:06 PM      Component Value Range Comment   Glucose-Capillary 132 (*) 70 - 99 (mg/dL)   GLUCOSE, CAPILLARY     Status: Abnormal   Collection Time   10/19/11 11:53 PM      Component Value Range Comment   Glucose-Capillary 127 (*) 70 - 99 (mg/dL)   GLUCOSE, CAPILLARY     Status: Abnormal   Collection Time   10/20/11  3:54 AM  Component Value Range Comment   Glucose-Capillary 135 (*) 70 - 99 (mg/dL)   GLUCOSE, CAPILLARY     Status: Abnormal   Collection Time   10/20/11  7:41 AM      Component Value Range Comment   Glucose-Capillary 142 (*) 70 - 99 (mg/dL)    No results found.     Assessment/Plan 1. Sepsis with bacteremia, likely secondary to her PICC line 2. Complex surgical history with gastroileal fistula causing a short gut syndrome requiring long term TNA (followed by [name] in our office) 3. UTI  Plan: 1. The patient's abdominal is completely stable.  Her g-tube can be clamped off and she can be given a regular diet from our standpoint as she  normally eats this for palliation.  She should continue on her TNA as her short gut syndrome will not give her the ability to absorb all the nutrients she needs with po intake. 2. Sepsis/bacteremia per medicine and infectious disease. 3. [name] is following her in the office and trying to get her albumin above 2.5 and her stabilized enough to go back and do another procedure to correct her short gut syndrome.  This would involve open SB resection & probable reconstruction of her gastrojejunostomy - a big operation.  Not low risk.  Need cardiac & medical clearance 1st.    I do ultimately agree that a palliative care medicine consult is appropriate if she does not improve.  Need to get a sense of long term goals / QOL  4. No surgical interventions at this time.  OSBORNE,KELLY E 10/20/2011, 11:06 AM  Ardeth Sportsman, M.D., F.A.C.S. Gastrointestinal and Minimally Invasive Surgery [facility] [address] [place], [place] [address] [phone] Main / Paging [phone] Voice Mail

## 2011-10-21 ENCOUNTER — Encounter (HOSPITAL_COMMUNITY)
Admission: EM | Disposition: A | Discharge status: Skilled Nursing Facility | Payer: Self-pay | Source: Home / Self Care | Attending: Internal Medicine

## 2011-10-21 DIAGNOSIS — I339 Acute and subacute endocarditis, unspecified: Secondary | ICD-10-CM

## 2011-10-21 HISTORY — PX: TEE WITHOUT CARDIOVERSION: SHX5443

## 2011-10-21 LAB — GLUCOSE, CAPILLARY
Glucose-Capillary: 146 mg/dL — ABNORMAL HIGH (ref 70–99)
Glucose-Capillary: 164 mg/dL — ABNORMAL HIGH (ref 70–99)

## 2011-10-21 LAB — CULTURE, BLOOD (ROUTINE X 2)

## 2011-10-21 LAB — BASIC METABOLIC PANEL
GFR calc Af Amer: >90 mL/min (ref >90)
GFR calc non Af Amer: >90 mL/min (ref >90)
Glucose, Bld: 150 mg/dL — ABNORMAL HIGH (ref 70–99)
Potassium: 3.5 mEq/L (ref 3.5–5.1)
Sodium: 132 mEq/L — ABNORMAL LOW (ref 135–145)

## 2011-10-21 LAB — HEPARIN LEVEL (UNFRACTIONATED): Heparin Unfractionated: 0.68 IU/mL (ref 0.30–0.70)

## 2011-10-21 LAB — CBC
Hemoglobin: 9.3 g/dL — ABNORMAL LOW (ref 12.0–15.0)
MCHC: 33.3 g/dL (ref 30.0–36.0)
Platelets: 281 10*3/uL (ref 150–400)
RDW: 17.7 % — ABNORMAL HIGH (ref 11.5–15.5)

## 2011-10-21 SURGERY — ECHOCARDIOGRAM, TRANSESOPHAGEAL
Anesthesia: Moderate Sedation

## 2011-10-21 MED ORDER — BUTAMBEN-TETRACAINE-BENZOCAINE 2-2-14 % EX AERO
INHALATION_SPRAY | CUTANEOUS | Status: DC | PRN
  Administered 2011-10-21: 2 via TOPICAL

## 2011-10-21 MED ORDER — MIDAZOLAM HCL 10 MG/2ML IJ SOLN
INTRAMUSCULAR | Status: AC
  Filled 2011-10-21: qty 2

## 2011-10-21 MED ORDER — DIPHENHYDRAMINE HCL 50 MG/ML IJ SOLN
INTRAMUSCULAR | Status: AC
  Filled 2011-10-21: qty 1

## 2011-10-21 MED ORDER — MIDAZOLAM HCL 10 MG/2ML IJ SOLN
INTRAMUSCULAR | Status: DC | PRN
  Administered 2011-10-21 (×3): 2 mg via INTRAVENOUS

## 2011-10-21 MED ORDER — METOPROLOL TARTRATE 1 MG/ML IV SOLN
5.0000 mg | Freq: Once | INTRAVENOUS | Status: DC
  Filled 2011-10-21: qty 5

## 2011-10-21 MED ORDER — FENTANYL CITRATE 0.05 MG/ML IJ SOLN
INTRAMUSCULAR | Status: AC
  Filled 2011-10-21: qty 2

## 2011-10-21 MED ORDER — METOPROLOL TARTRATE 1 MG/ML IV SOLN
INTRAVENOUS | Status: DC | PRN
  Administered 2011-10-21: 5 mg via INTRAVENOUS

## 2011-10-21 MED ORDER — FAT EMULSION 20 % IV EMUL
250.0000 mL | INTRAVENOUS | Status: AC
  Administered 2011-10-21: 250 mL via INTRAVENOUS
  Filled 2011-10-21: qty 250

## 2011-10-21 MED ORDER — METOPROLOL TARTRATE 1 MG/ML IV SOLN
INTRAVENOUS | Status: AC
  Filled 2011-10-21: qty 5

## 2011-10-21 MED ORDER — TRACE MINERALS CR-CU-MN-SE-ZN 10-1000-500-60 MCG/ML IV SOLN
INTRAVENOUS | Status: AC
  Administered 2011-10-21: 18:00:00 via INTRAVENOUS
  Filled 2011-10-21: qty 2000

## 2011-10-21 MED ORDER — FENTANYL CITRATE 0.05 MG/ML IJ SOLN
INTRAMUSCULAR | Status: DC | PRN
  Administered 2011-10-21 (×2): 25 ug via INTRAVENOUS

## 2011-10-21 NOTE — CV Procedure (Signed)
Transesophageal Echocardiogram: Indication: R/O SBE Bacteremia Sedation: Versed: 6, Fentanyl: 50, Other: 0 ASA: 3, Airway: 2  Procedure:  The patient was moderately sedated with the above doses of versed and fentanyl.  Using digital technique an omniplane probe was advanced into the distal esophagus without incident. Transgastric imaging revealed normal LV function with no RWMA;s and no mural apical thrombus..  Estimated ejection fraction was 65%.  Right sided cardiac chambers were normal with no evidence of pulmonary hypertension.  The pulmonary and tricuspid valves were structurally normal.  There was trivial TR The mitral valve was structurally normal with no mitral regurgitation.    The aortic valve was trileaflet with no AS/AR The aortic root was normal.    Imaging of the septum showed no ASD or VSD  2D and color flow confirmed no PFO  The LAE was well visualized in orthogonal views.  There was no spontaneous contrast and no thrombus.    The descending thoracic aorta had no mural aortic debris with no evidence of aneurysmal dilation or disection  Impression:  1) No SBE normal cardiac valves 2) Trivial MR 3) No ASD 4) EF 65% 5) Normal RV/RA 6) Trileaflet AV with no AS/AR   Charlton Haws 10/21/2011 11:17 AM

## 2011-10-21 NOTE — Progress Notes (Signed)
SLP Note  Received bedside swallow evaluation order, however patient in a procedure.  Aware of PO for palliation per surgeon's note.  Will re-attempt this afternoon, as patient is available.  Myra Rude, M.S.,CCC-SLP Pager [phone] 10/21/2011, 12:25 PM

## 2011-10-21 NOTE — Progress Notes (Signed)
ANTICOAGULATION CONSULT NOTE - Follow Up Consult  Pharmacy Consult for Heparin Indication: DVT at site of PICC  Allergies  Allergen Reactions  . Other Swelling    Pinto beans cause swelling and hives  . Penicillins Swelling    Patient Measurements: Height: 5\' 5"  (165.1 cm) Weight: 177 lb 7.5 oz (80.5 kg) IBW/kg (Calculated) : 57  Heparin Dosing Weight: 75 kg  Vital Signs: Temp: 99.4 F (37.4 C) (04/24 1335) Temp src: Oral (04/24 1335) BP: 164/88 mmHg (04/24 1335) Pulse Rate: 90  (04/24 1335)  Labs:  Basename 10/21/11 1325 10/20/11 1230 10/19/11 1125  HGB 9.3* 9.1* --  HCT 27.9* 27.4* 23.7*  PLT 281 287 247  APTT -- -- --  LABPROT -- -- --  INR -- -- --  HEPARINUNFRC 0.68 0.75* 0.56  CREATININE 0.48* -- 0.54  CKTOTAL -- -- --  CKMB -- -- --  TROPONINI -- -- --   Estimated Creatinine Clearance: 60.8 ml/min (by C-G formula based on Cr of 0.48).   Medications:  Infusions:     . sodium chloride 500 mL (10/20/11 1127)  . TPN (CLINIMIX) +/- additives 80 mL/hr at 10/19/11 1742   And  . fat emulsion 250 mL (10/19/11 1742)  . TPN (CLINIMIX) +/- additives 80 mL/hr at 10/20/11 1734   And  . fat emulsion 250 mL (10/20/11 1734)  . TPN (CLINIMIX) +/- additives     And  . fat emulsion    . heparin 1,700 Units/hr (10/21/11 0128)    Assessment: 78 yo female with new upper extremity DVT at PICC site. Labs drawn from foot stick. Heparin level = 0.68 on 1700 units/hr . No bleeding noted . CBC stable. No bleeding reported.   Goal of Therapy:  Heparin level 0.3-0.7 units/ml   Plan:  1. Continue IV heparin 1700 units/hr 2. Daily heparin level and CBC 3. Plan for long-term po anticoagulation with short gut?  Arman Filter 10/21/2011,3:15 PM

## 2011-10-21 NOTE — Interval H&P Note (Signed)
History and Physical Interval Note:  10/21/2011 11:15 AM  [name]  has presented today for surgery, with the diagnosis of cva  The various methods of treatment have been discussed with the patient and family. After consideration of risks, benefits and other options for treatment, the patient has consented to  Procedure(s) (LRB): TRANSESOPHAGEAL ECHOCARDIOGRAM (TEE) (N/A) as a surgical intervention .  The patients' history has been reviewed, patient examined, no change in status, stable for surgery.  I have reviewed the patients' chart and labs.  Questions were answered to the patient's satisfaction.     Charlton Haws   Asked top do TEE due to bacteremia.  R/O SBE.  TTE was normal.  Consent obtained by phone with relative

## 2011-10-21 NOTE — Progress Notes (Signed)
Patient ID: [name]  female  ZOX:[id number]    DOB: 1932/05/30    DOA: 10/12/2011  PCP: [name], MD, MD  Interim history she has history of perforated duodenal ulcer, status post antrectomy, duodenal resection, Billroth 2 reconstruction, omental patching of Duodenal bulb, open cholecystectomy, gastrostomy and jejunostomy on 05/07/11. Her course was complicated by epigastric abscess 05/31/11. 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 Brought to the ER on 4/15 w/ fever and purulent discharge from PICC insertion site, abd pain and UA concerning for UTI. She was started on broad spectrum abx, ID has been consulted. Blood cultures grew coagulase negative staph and Streptococcus alactolyticus, she also had e coli growing from urine. Repeat blood cultures on 10/17/2011 has been negative so far. At this time the right picc has been removed and she is getting TNA wire peripheral IV. She also has central line in place. Patient has been getting IV fentanyl when necessary for pain.    Subjective: Alert and awake, appears to be pleasantly confused, no complaints currently, watching TV  Objective: Weight change:   Intake/Output Summary (Last 24 hours) at 10/21/11 1017 Last data filed at 10/20/11 1500  Gross per 24 hour  Intake      0 ml  Output   1000 ml  Net  -1000 ml   Blood pressure 193/83, pulse 108, temperature 99.7 F (37.6 C), temperature source Oral, resp. rate 20, height 5\' 5"  (1.651 m), weight 80.5 kg (177 lb 7.5 oz), SpO2 94.00%.  Physical Exam: General: Alert and awake, not in any acute distress. HEENT: anicteric sclera, pupils reactive to light and accommodation, EOMI CVS: S1-S2 clear, no murmur rubs or gallops Chest: clear to auscultation bilaterally, no wheezing, rales or rhonchi Abdomen: soft nontender, PEG tube clamped Extremities: no cyanosis, clubbing or edema noted bilaterally   Lab Results: Basic Metabolic  Panel:  Lab 10/19/11 1125 10/18/11 0650  NA 130* 131*  K 3.7 3.7  CL 100 97  CO2 22 23  GLUCOSE 126* 130*  BUN 13 11  CREATININE 0.54 0.53  CALCIUM 8.5 8.5  MG 1.7 --  PHOS 4.2 --   Liver Function Tests:  Lab 10/19/11 1125 10/15/11 0500  AST 10 20  ALT 10 40*  ALKPHOS 89 119*  BILITOT 0.2* 0.4  PROT 6.0 5.7*  ALBUMIN 1.9* 1.9*     Lab 10/20/11 1230 10/19/11 1125  WBC 6.9 5.8  NEUTROABS -- 3.3  HGB 9.1* 8.0*  HCT 27.4* 23.7*  MCV 79.7 79.0  PLT 287 247  CBG:  Lab 10/21/11 0944 10/21/11 0412 10/21/11 0042 10/20/11 2002 10/20/11 1705  GLUCAP 144* 139* 143* 181* 138*     Micro Results: Recent Results (from the past 240 hour(s))  URINE CULTURE     Status: Normal   Collection Time   10/12/11  7:52 PM      Component Value Range Status Comment   Specimen Description URINE, CATHETERIZED   Final    Special Requests ADDED 10/12/11 2017   Final    Culture  Setup Time [id number]   Final    Colony Count >=100,000 COLONIES/ML   Final    Culture ESCHERICHIA COLI   Final    Report Status 10/15/2011 FINAL   Final    Organism ID, Bacteria ESCHERICHIA COLI   Final   CULTURE, BLOOD (ROUTINE X 2)     Status: Normal   Collection Time   10/12/11  8:00 PM  Component Value Range Status Comment   Specimen Description BLOOD HAND LEFT   Final    Special Requests     Final    Value: BOTTLES DRAWN AEROBIC AND ANAEROBIC AERO 10CC, ANAE 5CC   Culture  Setup Time     Final    Value: [id number] This organism DOES NOT demonstrate inducible Clindamycin resistance in vitro.   Culture     Final    Value: STREPTOCOCCUS SPECIES     STAPHYLOCOCCUS SPECIES (COAGULASE NEGATIVE)     Note: Identified as Streptococcus alactolyticus     Note: Gram Stain Report Called to,Read Back By and Verified With: CHRISTY FRALEY @1132  10/13/11 BY KRAWS   Report Status 10/17/2011 FINAL   Final    Organism ID, Bacteria STAPHYLOCOCCUS SPECIES (COAGULASE NEGATIVE)   Final    Organism ID, Bacteria  STREPTOCOCCUS SPECIES   Final   CULTURE, BLOOD (ROUTINE X 2)     Status: Normal   Collection Time   10/12/11  8:13 PM      Component Value Range Status Comment   Specimen Description BLOOD HAND RIGHT   Final    Special Requests BOTTLES DRAWN AEROBIC ONLY 10CC   Final    Culture  Setup Time [id number]   Final    Culture     Final    Value: STREPTOCOCCUS SPECIES     STAPHYLOCOCCUS SPECIES (COAGULASE NEGATIVE)     Note: Identified as Streptococcus alactolyticus SUSCEPTIBILITIES PERFORMED ON PREVIOUS CULTURE WITHIN THE LAST 5 DAYS.     Note: Gram Stain Report Called to,Read Back By and Verified With: LAUREN CONNALLY @ 1828 ON 10/13/11 BY GOLLD   Report Status 10/17/2011 FINAL   Final   CATH TIP CULTURE     Status: Normal   Collection Time   10/13/11  3:47 AM      Component Value Range Status Comment   Specimen Description CATH TIP   Final    Special Requests NONE   Final    Culture     Final    Value: >100 COLONIES STAPHYLOCOCCUS SPECIES (COAGULASE NEGATIVE)     Note: RIFAMPIN AND GENTAMICIN SHOULD NOT BE USED AS SINGLE DRUGS FOR TREATMENT OF STAPH INFECTIONS. This organism DOES NOT demonstrate inducible Clindamycin resistance in vitro.   Report Status 10/16/2011 FINAL   Final    Organism ID, Bacteria STAPHYLOCOCCUS SPECIES (COAGULASE NEGATIVE)   Final   MRSA PCR SCREENING     Status: Normal   Collection Time   10/13/11  3:51 AM      Component Value Range Status Comment   MRSA by PCR NEGATIVE  NEGATIVE  Final   MRSA PCR SCREENING     Status: Normal   Collection Time   10/13/11  3:35 PM      Component Value Range Status Comment   MRSA by PCR NEGATIVE  NEGATIVE  Final   CULTURE, BLOOD (ROUTINE X 2)     Status: Normal   Collection Time   10/13/11  5:00 PM      Component Value Range Status Comment   Specimen Description BLOOD RIGHT ARM   Final    Special Requests BOTTLES DRAWN AEROBIC ONLY 10CC   Final    Culture  Setup Time [id number]   Final    Culture     Final    Value:  STAPHYLOCOCCUS SPECIES (COAGULASE NEGATIVE)     Note: SUSCEPTIBILITIES PERFORMED ON PREVIOUS CULTURE WITHIN THE LAST 5 DAYS.     Note: Gram Stain  Report Called to,Read Back By and Verified With: MARK SIVNGE @0436  ON 10/16/11 BY MCLET   Report Status 10/17/2011 FINAL   Final   CULTURE, BLOOD (ROUTINE X 2)     Status: Normal   Collection Time   10/13/11  5:08 PM      Component Value Range Status Comment   Specimen Description BLOOD RIGHT HAND   Final    Special Requests BOTTLES DRAWN AEROBIC ONLY 5CC   Final    Culture  Setup Time [id number]   Final    Culture     Final    Value: STAPHYLOCOCCUS SPECIES (COAGULASE NEGATIVE)     Note: SUSCEPTIBILITIES PERFORMED ON PREVIOUS CULTURE WITHIN THE LAST 5 DAYS.     Note: Gram Stain Report Called to,Read Back By and Verified With: ASHLEY HARRISON @0627  ON 10/15/11 BY MCLET   Report Status 10/16/2011 FINAL   Final   URINE CULTURE     Status: Normal   Collection Time   10/13/11  5:12 PM      Component Value Range Status Comment   Specimen Description URINE, CATHETERIZED   Final    Special Requests NONE   Final    Culture  Setup Time [id number]   Final    Colony Count NO GROWTH   Final    Culture NO GROWTH   Final    Report Status 10/14/2011 FINAL   Final   CULTURE, BLOOD (ROUTINE X 2)     Status: Normal (Preliminary result)   Collection Time   10/17/11  9:15 AM      Component Value Range Status Comment   Specimen Description BLOOD RIGHT HAND   Final    Special Requests BOTTLES DRAWN AEROBIC ONLY 10CC   Final    Culture  Setup Time [id number]   Final    Culture     Final    Value:        BLOOD CULTURE RECEIVED NO GROWTH TO DATE CULTURE WILL BE HELD FOR 5 DAYS BEFORE ISSUING A FINAL NEGATIVE REPORT   Report Status PENDING   Incomplete   CULTURE, BLOOD (ROUTINE X 2)     Status: Normal (Preliminary result)   Collection Time   10/17/11  9:25 AM      Component Value Range Status Comment   Specimen Description BLOOD LEFT HAND   Final    Special  Requests BOTTLES DRAWN AEROBIC ONLY [facility]   Final    Culture  Setup Time [id number]   Final    Culture     Final    Value:        BLOOD CULTURE RECEIVED NO GROWTH TO DATE CULTURE WILL BE HELD FOR 5 DAYS BEFORE ISSUING A FINAL NEGATIVE REPORT   Report Status PENDING   Incomplete     Studies/Results: Ct Humerus Right W Contrast  10/16/2011  *RADIOLOGY REPORT*  Clinical Data: Upper extremity venous thrombosis.  Redness about the elbow.  Pain.  CT OF THE RIGHT HUMERUS WITH CONTRAST  Technique:  Multidetector CT imaging was performed following the standard protocol during bolus administration of intravenous contrast.  Contrast: 80mL OMNIPAQUE IOHEXOL 300 MG/ML  SOLN  Comparison: Plain films of the elbow 12/09/2007. Report of upper extremity ultrasound 10/14/2011 reviewed.  Findings: As described report of comparison ultrasound, clot is seen in the left subclavian, axillary and brachial veins. Subclavian vein is not well demonstrated on this examination as it is not scanned in its entirety.  Clot is well demonstrated extending to a  point 18 cm below the top of the humeral head. Extensive subcutaneous edema is present.  There is no focal fluid collection to suggest abscess.  No focal bony abnormality is identified.  There is some dependent atelectasis in the right lung. Imaged intra-abdominal contents are unremarkable.  IMPRESSION: Clot in the left subclavian, axillary and brachial veins as described above with associated subcutaneous edema.  Negative for abscess.  Original Report Authenticated By: Bernadene Bell. Maricela Curet, M.D.   Ct Abdomen Pelvis W Contrast  10/13/2011  *RADIOLOGY REPORT*  Clinical Data: 78 year old with sepsis.  CT ABDOMEN AND PELVIS WITH CONTRAST  Technique:  Multidetector CT imaging of the abdomen and pelvis was performed following the standard protocol during bolus administration of intravenous contrast.  Contrast: 80mL OMNIPAQUE IOHEXOL 300 MG/ML  SOLN  Comparison: 07/29/2011  Findings: There  are tiny bilateral pleural effusions with basilar atelectasis.  A small amount of focal atelectasis or round atelectasis at the right lung base on sequence 3, image 7.  No evidence for free air.  Coronary artery calcifications.  The gallbladder has been removed. Again noted is low density fluid or postsurgical changes between the stomach and left hepatic lobe, best seen on sequence 2, image 23.  This area roughly measures 2.1 cm and similar to the prior examination.  There is residual fluid or edema in the gallbladder fossa and along the inferior liver margin.  No evidence for a drainable fluid collection.  Gastrostomy tube is present.  Again noted is a large calcification near the pancreatic head and neck. There is oral contrast within the stomach and large bowel.  No gross abnormality to the liver, portal venous system, adrenal glands, kidneys or spleen.  Again noted are low density structures within the kidneys which are too small to definitively characterize but could represent cysts.  Calcifications associated with the uterus and cannot exclude fibroids.  No significant free fluid in the pelvis.  There is a right hip replacement.  No significant lymphadenopathy. A Foley catheter within the urinary bladder.  IMPRESSION:  Postsurgical changes in the upper abdomen.  Residual low density fluid or edema around the liver has minimally changed since 07/29/2011.  Tiny pleural effusions with bibasilar atelectasis.  Original Report Authenticated By: Richarda Overlie, M.D.   Ir Fluoro Guide Cv Line Right  09/29/2011  *RADIOLOGY REPORT*  Clinical Data: Diabetes  PICC LINE PLACEMENT WITH ULTRASOUND AND FLUOROSCOPIC  GUIDANCE  Fluoroscopy Time: 0.5 minutes.  The right arm was prepped with chlorhexidine, draped in the usual sterile fashion using maximum barrier technique (cap and mask, sterile gown, sterile gloves, large sterile sheet, hand hygiene and cutaneous antisepsis) and infiltrated locally with 1% Lidocaine.  Ultrasound  demonstrated patency of the right basilic vein, and this was documented with an image.  Under real-time ultrasound guidance, this vein was accessed with a 21 gauge micropuncture needle and image documentation was performed.  The needle was exchanged over a guidewire for a peel-away sheath through which a five [place] double lumen PICC trimmed to 40 cm was advanced, positioned with its tip at the lower SVC/right atrial junction.  Fluoroscopy during the procedure and fluoro spot radiograph confirms appropriate catheter position.  The catheter was flushed, secured to the skin with Prolene sutures, and covered with a sterile dressing.  Complications:  None  IMPRESSION: Successful right arm PICC line placement with ultrasound and fluoroscopic guidance.  The catheter is ready for use.  Original Report Authenticated By: Donavan Burnet, M.D.   Ir US Guide Vasc  Access Right  09/29/2011  *RADIOLOGY REPORT*  Clinical Data: Diabetes  PICC LINE PLACEMENT WITH ULTRASOUND AND FLUOROSCOPIC  GUIDANCE  Fluoroscopy Time: 0.5 minutes.  The right arm was prepped with chlorhexidine, draped in the usual sterile fashion using maximum barrier technique (cap and mask, sterile gown, sterile gloves, large sterile sheet, hand hygiene and cutaneous antisepsis) and infiltrated locally with 1% Lidocaine.  Ultrasound demonstrated patency of the right basilic vein, and this was documented with an image.  Under real-time ultrasound guidance, this vein was accessed with a 21 gauge micropuncture needle and image documentation was performed.  The needle was exchanged over a guidewire for a peel-away sheath through which a five [place] double lumen PICC trimmed to 40 cm was advanced, positioned with its tip at the lower SVC/right atrial junction.  Fluoroscopy during the procedure and fluoro spot radiograph confirms appropriate catheter position.  The catheter was flushed, secured to the skin with Prolene sutures, and covered with a sterile dressing.   Complications:  None  IMPRESSION: Successful right arm PICC line placement with ultrasound and fluoroscopic guidance.  The catheter is ready for use.  Original Report Authenticated By: Donavan Burnet, M.D.   Dg Chest Port 1 View  10/13/2011  *RADIOLOGY REPORT*  Clinical Data: Line placement.  PORTABLE CHEST - 1 VIEW  Comparison: 10/12/2011  Findings: Cardiomegaly with vascular congestion.  Diffuse interstitial prominence may reflect early interstitial edema.  No confluent opacities or effusions.  No acute bony abnormality.  Left central line is in place.  The tip is in the upper right atrium.  No pneumothorax.  IMPRESSION: Cardiomegaly, vascular congestion.  Question early interstitial edema.  Original Report Authenticated By: Cyndie Chime, M.D.   Dg Chest Port 1 View  10/12/2011  *RADIOLOGY REPORT*  Clinical Data: Fever.  Drainage about a PICC.  PORTABLE CHEST - 1 VIEW  Comparison: Fluoroscopic spot view from PICC placement 09/29/2011. Plain films of the chest 07/28/2011.  CT chest 01/26/2011.  Findings: The patient's right PICC has backed out with the tip in the right axilla.  Lungs are clear.  Heart size is normal.  No pneumothorax or pleural effusion.  IMPRESSION:  1.  Tip of the patient's right PICC is now in the right axilla. 2.  No acute cardiopulmonary disease.  Original Report Authenticated By: Bernadene Bell. Maricela Curet, M.D.   Dg Abd Portable 2v  10/13/2011  *RADIOLOGY REPORT*  Clinical Data: Abdominal pain.  PORTABLE ABDOMEN - 2 VIEW  Comparison: CT 10/13/2011  Findings: Oral contrast material seen within the colon.  There is a nonobstructive bowel gas pattern.  No free air on the decubitus views.  No organomegaly.  IMPRESSION: No obstruction or free air.  Original Report Authenticated By: Cyndie Chime, M.D.    Medications: Scheduled Meds:    . cefTRIAXone (ROCEPHIN)  IV  2 g Intravenous Q24H  . fentaNYL  25 mcg Transdermal Q72H  . insulin aspart  0-9 Units Subcutaneous Q4H  .  metroNIDAZOLE  500 mg Oral Q8H  . pantoprazole (PROTONIX) IV  40 mg Intravenous Q24H  . sodium chloride  10-40 mL Intracatheter Q12H  . vancomycin  1,250 mg Intravenous Q12H  . DISCONTD: fentaNYL  25 mcg Transdermal Once   Continuous Infusions:    . sodium chloride 500 mL (10/20/11 1127)  . TPN (CLINIMIX) +/- additives 80 mL/hr at 10/19/11 1742   And  . fat emulsion 250 mL (10/19/11 1742)  . TPN (CLINIMIX) +/- additives 80 mL/hr at 10/20/11 1734  And  . fat emulsion 250 mL (10/20/11 1734)  . heparin 1,700 Units/hr (10/21/11 0128)     Assessment/Plan:  Sepsis, Coag negative staph and strept Bacteremia:, Continue vancomycin for coag neg staph and strept  ID is following, reviewed Dr. Lattie Haw recommendations  - TEE pending, TEE for persistently positive blood cultures and low-grade fevers (with the possibility of endo carditis, septic thrombophlebitis)  DIABETES MELLITUS, TYPE [id number] SSI , stable  Hyponatremia: Stable for now, Sec to GI losses.   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 - Per general surgery, she can be given a regular diet from surgical standpoint for palliation. I will place swallow evaluation for appropriate diet recommendations.  DVT of Right upper extremity: Cont IV Heparin   Escherichia coli UTI (lower urinary tract infection): Most likely colonization as per ID   H/O C diff:Treated in February:No diarrhea at this time   Hypokalemia: Resolved   Chronic pain: On Fentanyl patch 25 mcg/hr Q 72 hr.   Dementia; pleasantly confused    DVT Prophylaxis: On heparin drip  Code Status: Full code  Disposition: Not medically ready    LOS: 9 days   Ajdin Macke M.D. [facility] 10/21/2011, 10:17 AM Pager: [phone]

## 2011-10-21 NOTE — Progress Notes (Signed)
Clinical Social Worker continuing to follow for disposition planning. Pt is from [facility] and current plan is for pt to return when medically stable for discharge. Clinical Social Worker communicated to facility and facility requested information in regard to pt tna. Clinical Social Worker faxed information to facility. Clinical Social Worker to continue to follow and update facility. Clinical Social Worker to facilitate pt discharge needs when pt medically stable for discharge.  Jacklynn Lewis, MSW, LCSWA  Clinical Social Work [phone]

## 2011-10-21 NOTE — Evaluation (Signed)
Clinical/Bedside Swallow Evaluation Patient Details  Name: [name] MRN: [id number] DOB: 09-03-1931 Today's Date: 10/21/2011  Past Medical History:  Past Medical History  Diagnosis Date  . Hypertension   . Reflux   . Right elbow pain     OTIF  . Dementia   . Depression   . Osteoarthritis   . Pancreatitis 11/2007    HOP  . Angiomyolipoma of kidney     right  . Ulcerative esophagitis 12/05/2007    hx elevated gastrin, severe on EGD by Dr Jena Gauss , h pylori negative  . Hiatal hernia   . S/P colonoscopy 2009    pt reports normal by Dr Lovell Sheehan  . Diabetes mellitus   . Anemia   . Hyponatremia   . Cellulitis   . Esophagitis   . Peptic ulcer   . Insomnia    Past Surgical History:  Past Surgical History  Procedure Date  . Orif right hip 1999    [facility]  . Umbilical hernia repair 52 months old    [place]  . Esophagogastroduodenoscopy 01/28/2011    Procedure: ESOPHAGOGASTRODUODENOSCOPY (EGD);  Surgeon: Arlyce Harman, MD;  Location: [facility];  Service: Endoscopy;  Laterality: N/A;  . Colonoscopy 01/28/2011    Procedure: COLONOSCOPY;  Surgeon: Arlyce Harman, MD;  Location: [facility];  Service: Endoscopy;  Laterality: N/A;  . Laparotomy 02/04/2011    Procedure: EXPLORATORY LAPAROTOMY;  Surgeon: Fabio Bering;  Location: [facility] ORS;  Service: General;  Laterality: N/A;  . Laparotomy 05/07/2011    Procedure: EXPLORATORY LAPAROTOMY;  Surgeon: Ardeth Sportsman, MD;  Location: [facility] OR;  Service: General;  Laterality: N/A;  lysis of adhesions  . Gastrostomy 05/07/2011    Procedure: GASTROSTOMY;  Surgeon: Ardeth Sportsman, MD;  Location: [facility] OR;  Service: General;  Laterality: N/A;  g-tube / j -tube placement  . Gastrectomy 05/07/2011    Procedure: GASTRECTOMY;  Surgeon: Ardeth Sportsman, MD;  Location: [facility] OR;  Service: General;  Laterality: N/A;  Partial gastrectomy  . Cholecystectomy 05/07/2011    Procedure: CHOLECYSTECTOMY;  Surgeon: Ardeth Sportsman, MD;  Location: [facility] OR;  Service: General;   Laterality: N/A;  open   HPI:  78 y.o. female s/p sepsis from PICC line, has a gastroileal fistula from Billroth II distal gastrectomy / GJ for perforated duodenal ulcer in Nov2012.  This has left her with a functional short gut syndrome with chronic TNA and allowed a diet for palliation.  Her G-tube is normally clamped and not to suction per surgeon.  Bedside swallow ordered to assess her ability for PO for palliation.   Assessment/Recommendations/Treatment Plan Clinical Impression Statement: Demonstrated consistent, immediate cough burst with thin liquid trials with no actual swallow due to thin liquids likely reaching airway before the swallow, with a resultant cough and ejection of all liquid.  Clinical impression for this etiology appears to be pressure related from impairment further down in GI system.  Patient refused any other consistencies due to the aggressive nature of her coughing episodes.  Await MD guidance as to whether there is a desire for further work-up versus eating/drinking with known aspiration, although the aspiration patient demonstrated today did not appear comfortable.  Risk for Aspiration: Severe Other Related Risk Factors:  (GI issues)  Swallow Evaluation Recommendations Diet Recommendations: NPO (Continue TNA) Oral Care Recommendations: Oral care QID  Treatment Plan Treatment Plan Recommendations: Other (Comment) (Await MD input for plan of care.  Defer goals until 4/25)   Myra Rude,  M.S.,CCC-SLP Pager [phone] 10/21/2011,2:21 PM

## 2011-10-21 NOTE — Progress Notes (Signed)
Subjective: No new complaints, pt to get TEE  Antibiotics:  Anti-infectives     Start     Dose/Rate Route Frequency Ordered Stop   10/20/11 2200   metroNIDAZOLE (FLAGYL) tablet 500 mg        500 mg Oral 3 times per day 10/20/11 1821     10/20/11 2000   cefTRIAXone (ROCEPHIN) 2 g in dextrose 5 % 50 mL IVPB        2 g 100 mL/hr over 30 Minutes Intravenous Every 24 hours 10/20/11 1821     10/18/11 2100   vancomycin (VANCOCIN) 1,250 mg in sodium chloride 0.9 % 250 mL IVPB        1,250 mg 166.7 mL/hr over 90 Minutes Intravenous Every 12 hours 10/18/11 0927     10/16/11 1500   gentamicin (GARAMYCIN) 70 mg in dextrose 5 % 50 mL IVPB  Status:  Discontinued        70 mg 103.5 mL/hr over 30 Minutes Intravenous Every 8 hours 10/16/11 1425 10/17/11 1015   10/15/11 1400   vancomycin (VANCOCIN) IVPB 1000 mg/200 mL premix  Status:  Discontinued        1,000 mg 200 mL/hr over 60 Minutes Intravenous Every 8 hours 10/15/11 0634 10/18/11 0928   10/13/11 2200   aztreonam (AZACTAM) 1 g in dextrose 5 % 50 mL IVPB  Status:  Discontinued        1 g 100 mL/hr over 30 Minutes Intravenous 3 times per day 10/13/11 1619 10/13/11 1639   10/13/11 1800   cefTAZidime (FORTAZ) 1 g in dextrose 5 % 50 mL IVPB  Status:  Discontinued        1 g 100 mL/hr over 30 Minutes Intravenous Every 8 hours 10/13/11 1640 10/16/11 1201   10/13/11 1615   aztreonam (AZACTAM) 2 g in dextrose 5 % 50 mL IVPB  Status:  Discontinued        2 g 100 mL/hr over 30 Minutes Intravenous  Once 10/13/11 1611 10/13/11 1612   10/13/11 1615   vancomycin (VANCOCIN) IVPB 1000 mg/200 mL premix  Status:  Discontinued        1,000 mg 200 mL/hr over 60 Minutes Intravenous  Once 10/13/11 1611 10/13/11 1612   10/13/11 1615   metroNIDAZOLE (FLAGYL) IVPB 500 mg  Status:  Discontinued        500 mg 100 mL/hr over 60 Minutes Intravenous  Once 10/13/11 1611 10/13/11 1612   10/13/11 1430   Ampicillin-Sulbactam (UNASYN) 3 g in sodium chloride 0.9 % 100  mL IVPB  Status:  Discontinued        3 g 100 mL/hr over 60 Minutes Intravenous STAT 10/13/11 1417 10/13/11 1444   10/13/11 0600   vancomycin (VANCOCIN) 1,250 mg in sodium chloride 0.9 % 250 mL IVPB  Status:  Discontinued        1,250 mg 166.7 mL/hr over 90 Minutes Intravenous Every 12 hours 10/13/11 0457 10/15/11 0632   10/13/11 0600   aztreonam (AZACTAM) 1 g in dextrose 5 % 50 mL IVPB  Status:  Discontinued        1 g 100 mL/hr over 30 Minutes Intravenous 3 times per day 10/13/11 0507 10/13/11 1526   10/13/11 0400   metroNIDAZOLE (FLAGYL) IVPB 500 mg  Status:  Discontinued        500 mg 100 mL/hr over 60 Minutes Intravenous Every 8 hours 10/13/11 0323 10/16/11 1201   10/12/11 1915   vancomycin (VANCOCIN) IVPB 1000 mg/200 mL premix  1,000 mg 200 mL/hr over 60 Minutes Intravenous  Once 10/12/11 1901 10/12/11 2227   10/12/11 1915   ceFEPIme (MAXIPIME) 2 g in dextrose 5 % 50 mL IVPB        2 g 100 mL/hr over 30 Minutes Intravenous To [facility] Emergency Dept 10/12/11 1901 10/12/11 2105          Medications: Scheduled Meds:    . cefTRIAXone (ROCEPHIN)  IV  2 g Intravenous Q24H  . fentaNYL  25 mcg Transdermal Q72H  . insulin aspart  0-9 Units Subcutaneous Q4H  . metroNIDAZOLE  500 mg Oral Q8H  . pantoprazole (PROTONIX) IV  40 mg Intravenous Q24H  . sodium chloride  10-40 mL Intracatheter Q12H  . vancomycin  1,250 mg Intravenous Q12H  . DISCONTD: fentaNYL  25 mcg Transdermal Once   Continuous Infusions:    . sodium chloride 500 mL (10/20/11 1127)  . TPN (CLINIMIX) +/- additives 80 mL/hr at 10/20/11 1734   And  . fat emulsion 250 mL (10/20/11 1734)  . TPN (CLINIMIX) +/- additives 80 mL/hr at 10/21/11 1758   And  . fat emulsion 250 mL (10/21/11 1759)  . heparin 1,700 Units/hr (10/21/11 1900)   PRN Meds:.acetaminophen, acetaminophen, butamben-tetracaine-benzocaine, fentaNYL, metoprolol, midazolam, morphine injection, ondansetron (ZOFRAN) IV, ondansetron (ZOFRAN) IV,  ondansetron, oxyCODONE, sodium chloride   Objective: Weight change:   Intake/Output Summary (Last 24 hours) at 10/21/11 2008 Last data filed at 10/21/11 1900  Gross per 24 hour  Intake      0 ml  Output   1650 ml  Net  -1650 ml   Blood pressure 164/88, pulse 90, temperature 99.4 F (37.4 C), temperature source Oral, resp. rate 22, height 5\' 5"  (1.651 m), weight 177 lb 7.5 oz (80.5 kg), SpO2 95.00%. Temp:  [99.4 F (37.4 C)-100.1 F (37.8 C)] 99.4 F (37.4 C) (04/24 1335) Pulse Rate:  [90-108] 90  (04/24 1335) Resp:  [17-27] 22  (04/24 1335) BP: (146-238)/(69-122) 164/88 mmHg (04/24 1335) SpO2:  [93 %-100 %] 95 % (04/24 1335)  Physical Exam: General: Alert and awake, Chronically ill appearin CV: tachycardic no mgr Pulm: fairly clear GI; soft nondistended nontender EXT: RUE with swelling and difficulty flexing extending about elbow   Lab Results:  Basename 10/21/11 1325 10/20/11 1230  WBC 6.3 6.9  HGB 9.3* 9.1*  HCT 27.9* 27.4*  PLT 281 287    BMET  Basename 10/21/11 1325 10/19/11 1125  NA 132* 130*  K 3.5 3.7  CL 99 100  CO2 24 22  GLUCOSE 150* 126*  BUN 12 13  CREATININE 0.48* 0.54  CALCIUM 9.0 8.5    Micro Results: Recent Results (from the past 240 hour(s))  URINE CULTURE     Status: Normal   Collection Time   10/12/11  7:52 PM      Component Value Range Status Comment   Specimen Description URINE, CATHETERIZED   Final    Special Requests ADDED 10/12/11 2017   Final    Culture  Setup Time [id number]   Final    Colony Count >=100,000 COLONIES/ML   Final    Culture ESCHERICHIA COLI   Final    Report Status 10/15/2011 FINAL   Final    Organism ID, Bacteria ESCHERICHIA COLI   Final   CULTURE, BLOOD (ROUTINE X 2)     Status: Normal   Collection Time   10/12/11  8:00 PM      Component Value Range Status Comment   Specimen Description BLOOD HAND  LEFT   Final    Special Requests     Final    Value: BOTTLES DRAWN AEROBIC AND ANAEROBIC AERO 10CC, ANAE  5CC   Culture  Setup Time     Final    Value: [id number] This organism DOES NOT demonstrate inducible Clindamycin resistance in vitro.   Culture     Final    Value: CLOSTRIDIUM PERFRINGENS     STREPTOCOCCUS SPECIES     Note: Identified as Streptococcus alactolyticus     STAPHYLOCOCCUS SPECIES (COAGULASE NEGATIVE)     Note: RIFAMPIN AND GENTAMICIN SHOULD NOT BE USED AS SINGLE DRUGS FOR TREATMENT OF STAPH INFECTIONS. This organism DOES NOT demonstrate inducible Clindamycin resistance in vitro.   Report Status 10/21/2011 FINAL   Final    Organism ID, Bacteria STAPHYLOCOCCUS SPECIES (COAGULASE NEGATIVE)   Final    Organism ID, Bacteria STREPTOCOCCUS SPECIES   Final    Organism ID, Bacteria STREPTOCOCCUS SPECIES   Final    Organism ID, Bacteria STAPHYLOCOCCUS SPECIES (COAGULASE NEGATIVE)   Final   CULTURE, BLOOD (ROUTINE X 2)     Status: Normal   Collection Time   10/12/11  8:13 PM      Component Value Range Status Comment   Specimen Description BLOOD HAND RIGHT   Final    Special Requests BOTTLES DRAWN AEROBIC ONLY 10CC   Final    Culture  Setup Time [id number]   Final    Culture     Final    Value: STREPTOCOCCUS SPECIES     STAPHYLOCOCCUS SPECIES (COAGULASE NEGATIVE)     Note: Identified as Streptococcus alactolyticus SUSCEPTIBILITIES PERFORMED ON PREVIOUS CULTURE WITHIN THE LAST 5 DAYS.     Note: Gram Stain Report Called to,Read Back By and Verified With: LAUREN CONNALLY @ 1828 ON 10/13/11 BY GOLLD   Report Status 10/17/2011 FINAL   Final   CATH TIP CULTURE     Status: Normal   Collection Time   10/13/11  3:47 AM      Component Value Range Status Comment   Specimen Description CATH TIP   Final    Special Requests NONE   Final    Culture     Final    Value: >100 COLONIES STAPHYLOCOCCUS SPECIES (COAGULASE NEGATIVE)     Note: RIFAMPIN AND GENTAMICIN SHOULD NOT BE USED AS SINGLE DRUGS FOR TREATMENT OF STAPH INFECTIONS. This organism DOES NOT demonstrate inducible Clindamycin resistance  in vitro.   Report Status 10/16/2011 FINAL   Final    Organism ID, Bacteria STAPHYLOCOCCUS SPECIES (COAGULASE NEGATIVE)   Final   MRSA PCR SCREENING     Status: Normal   Collection Time   10/13/11  3:51 AM      Component Value Range Status Comment   MRSA by PCR NEGATIVE  NEGATIVE  Final   MRSA PCR SCREENING     Status: Normal   Collection Time   10/13/11  3:35 PM      Component Value Range Status Comment   MRSA by PCR NEGATIVE  NEGATIVE  Final   CULTURE, BLOOD (ROUTINE X 2)     Status: Normal   Collection Time   10/13/11  5:00 PM      Component Value Range Status Comment   Specimen Description BLOOD RIGHT ARM   Final    Special Requests BOTTLES DRAWN AEROBIC ONLY 10CC   Final    Culture  Setup Time [id number]   Final    Culture     Final  Value: STAPHYLOCOCCUS SPECIES (COAGULASE NEGATIVE)     Note: SUSCEPTIBILITIES PERFORMED ON PREVIOUS CULTURE WITHIN THE LAST 5 DAYS.     Note: Gram Stain Report Called to,Read Back By and Verified With: MARK SIVNGE @0436  ON 10/16/11 BY MCLET   Report Status 10/17/2011 FINAL   Final   CULTURE, BLOOD (ROUTINE X 2)     Status: Normal   Collection Time   10/13/11  5:08 PM      Component Value Range Status Comment   Specimen Description BLOOD RIGHT HAND   Final    Special Requests BOTTLES DRAWN AEROBIC ONLY 5CC   Final    Culture  Setup Time [id number]   Final    Culture     Final    Value: STAPHYLOCOCCUS SPECIES (COAGULASE NEGATIVE)     Note: SUSCEPTIBILITIES PERFORMED ON PREVIOUS CULTURE WITHIN THE LAST 5 DAYS.     Note: Gram Stain Report Called to,Read Back By and Verified With: ASHLEY HARRISON @0627  ON 10/15/11 BY MCLET   Report Status 10/16/2011 FINAL   Final   URINE CULTURE     Status: Normal   Collection Time   10/13/11  5:12 PM      Component Value Range Status Comment   Specimen Description URINE, CATHETERIZED   Final    Special Requests NONE   Final    Culture  Setup Time [id number]   Final    Colony Count NO GROWTH   Final     Culture NO GROWTH   Final    Report Status 10/14/2011 FINAL   Final   CULTURE, BLOOD (ROUTINE X 2)     Status: Normal (Preliminary result)   Collection Time   10/17/11  9:15 AM      Component Value Range Status Comment   Specimen Description BLOOD RIGHT HAND   Final    Special Requests BOTTLES DRAWN AEROBIC ONLY 10CC   Final    Culture  Setup Time [id number]   Final    Culture     Final    Value:        BLOOD CULTURE RECEIVED NO GROWTH TO DATE CULTURE WILL BE HELD FOR 5 DAYS BEFORE ISSUING A FINAL NEGATIVE REPORT   Report Status PENDING   Incomplete   CULTURE, BLOOD (ROUTINE X 2)     Status: Normal (Preliminary result)   Collection Time   10/17/11  9:25 AM      Component Value Range Status Comment   Specimen Description BLOOD LEFT HAND   Final    Special Requests BOTTLES DRAWN AEROBIC ONLY [facility]   Final    Culture  Setup Time [id number]   Final    Culture     Final    Value:        BLOOD CULTURE RECEIVED NO GROWTH TO DATE CULTURE WILL BE HELD FOR 5 DAYS BEFORE ISSUING A FINAL NEGATIVE REPORT   Report Status PENDING   Incomplete     Studies/Results: No results found.    Assessment/Plan: [name] is a 78 y.o. female admtited withseptic physiology and confusing micro findings  Inititally. Data showing CNS bacteremia that is persistent and concerning for septic thrombophlebitis and endovascular infection. She also grew PCN sensitive Strep BOVIS species from 2/2 admission cultures, and a Clostridium perfringens species from 1/2 cultures TEE was negative for vegetations  1) Polymicrobial bactermia  And difficult to clear  CNS bacteremia with possible septic thrombophebitis: TEE was negative for vegeations  GREATLY APPRECIATE CARDIOLOGY'S help here   --  continue vancomycin, rocephin and flagyl for minimum of 4  weeks of therapy   2) Streptococcus bovis bactermia: Can be a marker for Colon cancer. Should inquire re most recent colonoscopy   I will make sure she has followup with  me in [facility] in  Next 2-3  weeks   Please call with further questions.    LOS: 9 days   Acey Lav 10/21/2011, 8:08 PM

## 2011-10-21 NOTE — Progress Notes (Signed)
  Echocardiogram Echocardiogram Transesophageal has been performed.  [name] [facility] 10/21/2011, 12:02 PM

## 2011-10-21 NOTE — Progress Notes (Signed)
Patient ID: [name], female   DOB: [birth year], 78 y.o.   MRN: [id number] Patient is currently getting her TEE.  When she no longer has to be NPO, she has start a regular diet for palliative eating from our standpoint.  She needs her G -tube flushed q 6-8 hrs.  Gaberiel Youngblood E 11:00 AM

## 2011-10-22 ENCOUNTER — Encounter (HOSPITAL_COMMUNITY): Payer: Self-pay | Admitting: Cardiovascular Disease

## 2011-10-22 LAB — GLUCOSE, CAPILLARY
Glucose-Capillary: 125 mg/dL — ABNORMAL HIGH (ref 70–99)
Glucose-Capillary: 133 mg/dL — ABNORMAL HIGH (ref 70–99)
Glucose-Capillary: 113 mg/dL — ABNORMAL HIGH (ref 70–99)
Glucose-Capillary: 140 mg/dL — ABNORMAL HIGH (ref 70–99)
Glucose-Capillary: 168 mg/dL — ABNORMAL HIGH (ref 70–99)

## 2011-10-22 LAB — COMPREHENSIVE METABOLIC PANEL
ALT: 9 U/L (ref 0–35)
AST: 10 U/L (ref 0–37)
Albumin: 2.1 g/dL — ABNORMAL LOW (ref 3.5–5.2)
Calcium: 8.9 mg/dL (ref 8.4–10.5)
Chloride: 100 mEq/L (ref 96–112)
Creatinine, Ser: 0.49 mg/dL — ABNORMAL LOW (ref 0.50–1.10)
Sodium: 133 mEq/L — ABNORMAL LOW (ref 135–145)

## 2011-10-22 LAB — PHOSPHORUS: Phosphorus: 4.6 mg/dL (ref 2.3–4.6)

## 2011-10-22 LAB — CBC
Hemoglobin: 9.2 g/dL — ABNORMAL LOW (ref 12.0–15.0)
RBC: 3.47 MIL/uL — ABNORMAL LOW (ref 3.87–5.11)

## 2011-10-22 LAB — HEPARIN LEVEL (UNFRACTIONATED): Heparin Unfractionated: 0.65 IU/mL (ref 0.30–0.70)

## 2011-10-22 LAB — VANCOMYCIN, TROUGH: Vancomycin Tr: 17.8 ug/mL (ref 10.0–20.0)

## 2011-10-22 MED ORDER — METOPROLOL TARTRATE 12.5 MG HALF TABLET
12.5000 mg | ORAL_TABLET | Freq: Two times a day (BID) | ORAL | Status: DC
  Filled 2011-10-22 (×2): qty 1

## 2011-10-22 MED ORDER — METOPROLOL TARTRATE 25 MG PO TABS
25.0000 mg | ORAL_TABLET | Freq: Two times a day (BID) | ORAL | Status: DC
  Administered 2011-10-22 (×2): 25 mg
  Filled 2011-10-22 (×4): qty 1

## 2011-10-22 MED ORDER — POTASSIUM CHLORIDE 10 MEQ/50ML IV SOLN
10.0000 meq | INTRAVENOUS | Status: AC
  Administered 2011-10-22 (×4): 10 meq via INTRAVENOUS
  Filled 2011-10-22 (×4): qty 50

## 2011-10-22 MED ORDER — BENAZEPRIL HCL 20 MG PO TABS
20.0000 mg | ORAL_TABLET | Freq: Every day | ORAL | Status: DC
  Filled 2011-10-22: qty 1

## 2011-10-22 MED ORDER — CLONIDINE HCL 0.1 MG/24HR TD PTWK: 0.1000 mg | MEDICATED_PATCH | TRANSDERMAL | Status: DC

## 2011-10-22 MED ORDER — AMLODIPINE BESYLATE 5 MG PO TABS
5.0000 mg | ORAL_TABLET | Freq: Every day | ORAL | Status: DC
  Filled 2011-10-22: qty 1

## 2011-10-22 MED ORDER — FAT EMULSION 20 % IV EMUL
250.0000 mL | INTRAVENOUS | Status: AC
  Administered 2011-10-22: 250 mL via INTRAVENOUS
  Filled 2011-10-22: qty 250

## 2011-10-22 MED ORDER — INSULIN REGULAR HUMAN 100 UNIT/ML IJ SOLN
INTRAVENOUS | Status: AC
  Administered 2011-10-22: 18:00:00 via INTRAVENOUS
  Filled 2011-10-22: qty 2000

## 2011-10-22 MED ORDER — AMLODIPINE BESYLATE 5 MG PO TABS
5.0000 mg | ORAL_TABLET | Freq: Every day | ORAL | Status: DC
  Administered 2011-10-22 – 2011-10-23 (×2): 5 mg
  Filled 2011-10-22 (×3): qty 1

## 2011-10-22 MED ORDER — MAGNESIUM SULFATE 40 MG/ML IJ SOLN
2.0000 g | Freq: Once | INTRAMUSCULAR | Status: AC
  Administered 2011-10-22: 2 g via INTRAVENOUS
  Filled 2011-10-22: qty 50

## 2011-10-22 MED ORDER — CLONIDINE HCL 0.2 MG/24HR TD PTWK
0.2000 mg | MEDICATED_PATCH | TRANSDERMAL | Status: DC
  Administered 2011-10-26: 0.2 mg via TRANSDERMAL
  Filled 2011-10-22: qty 1

## 2011-10-22 NOTE — Progress Notes (Signed)
As above See prior note - no new recs.  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

## 2011-10-22 NOTE — Progress Notes (Signed)
Clinical Social Worker continuing to follow for disposition planning. Pt is from [facility]. Per MD, consult to Palliative Medicine Team for goals of care today. Clinical Social Worker to follow up after recommendations for Palliative Medicine Team goals of care meeting. Clinical Social Worker to continue to follow.  Jacklynn Lewis, MSW, LCSWA  Clinical Social Work [phone]

## 2011-10-22 NOTE — Progress Notes (Signed)
SLP Note  Await MD input and patient/family decision as to whether SLP needs to proceed with an objective swallow evaluation, etc., following yesterday's overt, immediate signs of aspiration with patient discomfort.    Available to reassess if patient's medical status acutely improves.  Suspect this new aspiration is due to lower esophageal/GI issues contributing to higher pharyngeal pressures, altering airway protection.  Myra Rude, M.S.,CCC-SLP Pager [phone] 10/22/2011, 8:56 AM

## 2011-10-22 NOTE — Progress Notes (Signed)
Room  3022 - [name]    PMT-Palliative Medicine Team - RN Liaison Visit  PMT consult ordered for GOC.  Discussed with dtr Langston Reusing @ [phone].  Scheduled at family's request for Saturday 4/27 @ 12 noon.  Please call PMT phone @ [phone] with any questions.  Thank you.  Chalmers Cater, RN Palliative Medicine Team RN Liaison [phone]

## 2011-10-22 NOTE — Progress Notes (Signed)
Patient ID: [name], female   DOB: 11/14/1931, 78 y.o.   MRN: [id number] 1 Day Post-Op  Subjective: Pt without complaints.    Objective: Vital signs in last 24 hours: Temp:  [99.2 F (37.3 C)-99.4 F (37.4 C)] 99.4 F (37.4 C) (04/25 0618) Pulse Rate:  [90-102] 102  (04/25 0618) Resp:  [20-25] 20  (04/25 0618) BP: (142-238)/(50-122) 177/59 mmHg (04/25 0618) SpO2:  [93 %-100 %] 93 % (04/25 0618) Last BM Date: 10/15/11  Intake/Output from previous day: 04/24 0701 - 04/25 0700 In: -  Out: 2650 [Urine:2650] Intake/Output this shift:    PE: Abd: soft, Nt, ND, g tube in place and clamped.  Midline wound has it's normal drainage present.  Lab Results:   Basename 10/22/11 0645 10/21/11 1325  WBC 5.7 6.3  HGB 9.2* 9.3*  HCT 28.1* 27.9*  PLT 309 281   BMET  Basename 10/22/11 0645 10/21/11 1325  NA 133* 132*  K 3.3* 3.5  CL 100 99  CO2 24 24  GLUCOSE 147* 150*  BUN 12 12  CREATININE 0.49* 0.48*  CALCIUM 8.9 9.0   PT/INR No results found for this basename: LABPROT:2,INR:2 in the last 72 hours CMP     Component Value Date/Time   NA 133* 10/22/2011 0645   K 3.3* 10/22/2011 0645   CL 100 10/22/2011 0645   CO2 24 10/22/2011 0645   GLUCOSE 147* 10/22/2011 0645   BUN 12 10/22/2011 0645   CREATININE 0.49* 10/22/2011 0645   CALCIUM 8.9 10/22/2011 0645   PROT 6.3 10/22/2011 0645   ALBUMIN 2.1* 10/22/2011 0645   AST 10 10/22/2011 0645   ALT 9 10/22/2011 0645   ALKPHOS 95 10/22/2011 0645   BILITOT 0.1* 10/22/2011 0645   GFRNONAA >90 10/22/2011 0645   GFRAA >90 10/22/2011 0645   Lipase     Component Value Date/Time   LIPASE 18 10/13/2011 1630       Studies/Results: No results found.  Anti-infectives: Anti-infectives     Start     Dose/Rate Route Frequency Ordered Stop   10/20/11 2200   metroNIDAZOLE (FLAGYL) tablet 500 mg        500 mg Oral 3 times per day 10/20/11 1821     10/20/11 2000   cefTRIAXone (ROCEPHIN) 2 g in dextrose 5 % 50 mL IVPB        2 g 100 mL/hr  over 30 Minutes Intravenous Every 24 hours 10/20/11 1821     10/18/11 2100   vancomycin (VANCOCIN) 1,250 mg in sodium chloride 0.9 % 250 mL IVPB        1,250 mg 166.7 mL/hr over 90 Minutes Intravenous Every 12 hours 10/18/11 0927     10/16/11 1500   gentamicin (GARAMYCIN) 70 mg in dextrose 5 % 50 mL IVPB  Status:  Discontinued        70 mg 103.5 mL/hr over 30 Minutes Intravenous Every 8 hours 10/16/11 1425 10/17/11 1015   10/15/11 1400   vancomycin (VANCOCIN) IVPB 1000 mg/200 mL premix  Status:  Discontinued        1,000 mg 200 mL/hr over 60 Minutes Intravenous Every 8 hours 10/15/11 0634 10/18/11 0928   10/13/11 2200   aztreonam (AZACTAM) 1 g in dextrose 5 % 50 mL IVPB  Status:  Discontinued        1 g 100 mL/hr over 30 Minutes Intravenous 3 times per day 10/13/11 1619 10/13/11 1639   10/13/11 1800   cefTAZidime (FORTAZ) 1 g in dextrose  5 % 50 mL IVPB  Status:  Discontinued        1 g 100 mL/hr over 30 Minutes Intravenous Every 8 hours 10/13/11 1640 10/16/11 1201   10/13/11 1615   aztreonam (AZACTAM) 2 g in dextrose 5 % 50 mL IVPB  Status:  Discontinued        2 g 100 mL/hr over 30 Minutes Intravenous  Once 10/13/11 1611 10/13/11 1612   10/13/11 1615   vancomycin (VANCOCIN) IVPB 1000 mg/200 mL premix  Status:  Discontinued        1,000 mg 200 mL/hr over 60 Minutes Intravenous  Once 10/13/11 1611 10/13/11 1612   10/13/11 1615   metroNIDAZOLE (FLAGYL) IVPB 500 mg  Status:  Discontinued        500 mg 100 mL/hr over 60 Minutes Intravenous  Once 10/13/11 1611 10/13/11 1612   10/13/11 1430   Ampicillin-Sulbactam (UNASYN) 3 g in sodium chloride 0.9 % 100 mL IVPB  Status:  Discontinued        3 g 100 mL/hr over 60 Minutes Intravenous STAT 10/13/11 1417 10/13/11 1444   10/13/11 0600   vancomycin (VANCOCIN) 1,250 mg in sodium chloride 0.9 % 250 mL IVPB  Status:  Discontinued        1,250 mg 166.7 mL/hr over 90 Minutes Intravenous Every 12 hours 10/13/11 0457 10/15/11 0632   10/13/11  0600   aztreonam (AZACTAM) 1 g in dextrose 5 % 50 mL IVPB  Status:  Discontinued        1 g 100 mL/hr over 30 Minutes Intravenous 3 times per day 10/13/11 0507 10/13/11 1526   10/13/11 0400   metroNIDAZOLE (FLAGYL) IVPB 500 mg  Status:  Discontinued        500 mg 100 mL/hr over 60 Minutes Intravenous Every 8 hours 10/13/11 0323 10/16/11 1201   10/12/11 1915   vancomycin (VANCOCIN) IVPB 1000 mg/200 mL premix        1,000 mg 200 mL/hr over 60 Minutes Intravenous  Once 10/12/11 1901 10/12/11 2227   10/12/11 1915   ceFEPIme (MAXIPIME) 2 g in dextrose 5 % 50 mL IVPB        2 g 100 mL/hr over 30 Minutes Intravenous To [facility] Emergency Dept 10/12/11 1901 10/12/11 2105           Assessment/Plan  1. 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 Dysphagia 3. Bacteremia  Plan: 1. No immediate surgical plans.  Will d/w [name] who actually follows her in the office.  Agree with palliative care consult at least for goals of care.     LOS: 10 days    Ceaira Ernster E 10/22/2011

## 2011-10-22 NOTE — Progress Notes (Signed)
Nutrition Follow-up  Diet Order:  NPO  Per chart review pt was previously on CHO Modified medium diet during recent hospitalizations. Per pt she ate for pleasure and did not always eat something every day. SLP to eval swallow if family desires. Per notes pt had an aspiration event yesterday 4/24. Noted that palliative care consult with plans to meet with pt/family about GOC on Saturday 4/27.  TPN: Clinimix E 5/15 @ 80 ml/hr with 20% lipids @ 8 ml/hr, MVI/Trace elements MWF due to shortage.  Current prescription provides: 1747 kcals (>100% of minimum needs), 96 grams protein (> 100% of minimum needs), with GIR of 2.48. Per [name] note pt will need to improve before any more possible surgery to correct the gastroileal fistula.   Meds: Scheduled Meds:   . amLODipine  5 mg Per Tube Daily  . cefTRIAXone (ROCEPHIN)  IV  2 g Intravenous Q24H  . cloNIDine  0.2 mg Transdermal Weekly  . fentaNYL  25 mcg Transdermal Q72H  . insulin aspart  0-9 Units Subcutaneous Q4H  . magnesium sulfate 1 - 4 g bolus IVPB  2 g Intravenous Once  . metoprolol  5 mg Intravenous Once  . metoprolol tartrate  25 mg Per Tube BID  . metroNIDAZOLE  500 mg Oral Q8H  . pantoprazole (PROTONIX) IV  40 mg Intravenous Q24H  . potassium chloride  10 mEq Intravenous Q1 Hr x 4  . sodium chloride  10-40 mL Intracatheter Q12H  . vancomycin  1,250 mg Intravenous Q12H  . DISCONTD: amLODipine  5 mg Oral Daily  . DISCONTD: benazepril  20 mg Oral Daily  . DISCONTD: cloNIDine  0.1 mg Transdermal Weekly  . DISCONTD: metoprolol tartrate  12.5 mg Oral BID   Continuous Infusions:   . sodium chloride 500 mL (10/20/11 1127)  . TPN (CLINIMIX) +/- additives 80 mL/hr at 10/20/11 1734   And  . fat emulsion 250 mL (10/20/11 1734)  . TPN (CLINIMIX) +/- additives 80 mL/hr at 10/21/11 1758   And  . fat emulsion 250 mL (10/21/11 1759)  . TPN (CLINIMIX) +/- additives     And  . fat emulsion    . heparin 1,700 Units/hr (10/22/11 0840)   PRN  Meds:.acetaminophen, acetaminophen, morphine injection, ondansetron (ZOFRAN) IV, ondansetron (ZOFRAN) IV, ondansetron, oxyCODONE, sodium chloride, DISCONTD: butamben-tetracaine-benzocaine, DISCONTD: fentaNYL, DISCONTD: metoprolol, DISCONTD: midazolam  Labs:  CMP     Component Value Date/Time   NA 133* 10/22/2011 0645   K 3.3* 10/22/2011 0645   CL 100 10/22/2011 0645   CO2 24 10/22/2011 0645   GLUCOSE 147* 10/22/2011 0645   BUN 12 10/22/2011 0645   CREATININE 0.49* 10/22/2011 0645   CALCIUM 8.9 10/22/2011 0645   PROT 6.3 10/22/2011 0645   ALBUMIN 2.1* 10/22/2011 0645   AST 10 10/22/2011 0645   ALT 9 10/22/2011 0645   ALKPHOS 95 10/22/2011 0645   BILITOT 0.1* 10/22/2011 0645   GFRNONAA >90 10/22/2011 0645   GFRAA >90 10/22/2011 0645     Intake/Output Summary (Last 24 hours) at 10/22/11 1552 Last data filed at 10/22/11 1433  Gross per 24 hour  Intake      0 ml  Output   2750 ml  Net  -2750 ml    Weight Status:   179 lbs 4/25 177 lbs 4/22  Estimated needs:  Kcal: 1650-1850 Protein: 85-100 grams  Nutrition Dx:  Altered GI function (NI-1.4) related to gastroileal fistula with short gut syndrome AEB NPO status/need for long term TPN. Status:  Ongoing  Goal:  TPN to meet nutrition needs; met  Intervention:  None at this time   Monitor:  RD to monitor TPN with Pharmacy.  Will assist with transition to enteral nutrition when/if possible   Kendell Bane Cornelison Pager #:  [phone]

## 2011-10-22 NOTE — Progress Notes (Signed)
 Patient ID: [name]  female  ZOX:[id number]    DOB: 09-18-1931    DOA: 10/12/2011  PCP: [name], MD, MD  Interim history she has history of perforated duodenal ulcer, status post antrectomy, duodenal resection, Billroth 2 reconstruction, omental patching of Duodenal bulb, open cholecystectomy, gastrostomy and jejunostomy on 05/07/11. Her course was complicated by epigastric abscess 05/31/11. 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 Brought to the ER on 4/15 w/ fever and purulent discharge from PICC insertion site, abd pain and UA concerning for UTI. She was started on broad spectrum abx, ID has been consulted. Blood cultures grew coagulase negative staph and Streptococcus alactolyticus, she also had e coli growing from urine. Repeat blood cultures on 10/17/2011 has been negative so far. At this time the right picc has been removed and she is getting TNA wire peripheral IV. She also has central line in place. Patient has been getting IV fentanyl when necessary for pain.  10/20/2011: General surgery consulted, PEG tube clamped by surgery service 10/21/2011: Patient underwent TEE, did not show any endocarditis. Speech therapy was consulted for swallowing evaluation, however patient immediately aspirated on every consistency.  10/22/2011: I have called for palliative medicine consult for goals of care, patient will remain n.p.o., Continue TNA. I have called for medical records from patient's gastroenterologist, Dr. Raj Janus in [place].  Subjective: Alert and awake, appears to be pleasantly confused, no complaints per patient. No issues overnight per RN.  Objective: Weight change:   Intake/Output Summary (Last 24 hours) at 10/22/11 1048 Last data filed at 10/22/11 0400  Gross per 24 hour  Intake      0 ml  Output   2650 ml  Net  -2650 ml   Blood pressure 177/59, pulse 102, temperature 99.4 F (37.4 C), temperature source  Oral, resp. rate 20, height 5\' 5"  (1.651 m), weight 80.5 kg (177 lb 7.5 oz), SpO2 93.00%.  Physical Exam: General: Alert and awake, not in any acute distress. HEENT: anicteric sclera, pupils reactive to light and accommodation, EOMI CVS: S1-S2 clear, no murmur rubs or gallops Chest: clear to auscultation bilaterally, no wheezing, rales or rhonchi Abdomen: soft nontender, PEG tube clamped Extremities: no cyanosis, clubbing or edema noted bilaterally   Lab Results: Basic Metabolic Panel:  Lab 10/22/11 8119 10/21/11 1325  NA 133* 132*  K 3.3* 3.5  CL 100 99  CO2 24 24  GLUCOSE 147* 150*  BUN 12 12  CREATININE 0.49* 0.48*  CALCIUM 8.9 9.0  MG 1.5 --  PHOS 4.6 --   Liver Function Tests:  Lab 10/22/11 0645 10/19/11 1125  AST 10 10  ALT 9 10  ALKPHOS 95 89  BILITOT 0.1* 0.2*  PROT 6.3 6.0  ALBUMIN 2.1* 1.9*     Lab 10/22/11 0645 10/21/11 1325 10/19/11 1125  WBC 5.7 6.3 --  NEUTROABS -- -- 3.3  HGB 9.2* 9.3* --  HCT 28.1* 27.9* --  MCV 81.0 80.2 --  PLT 309 281 --  CBG:  Lab 10/22/11 0740 10/22/11 0410 10/22/11 0031 10/21/11 2010 10/21/11 1634  GLUCAP 134* 133* 125* 164* 146*     Micro Results: Recent Results (from the past 240 hour(s))  URINE CULTURE     Status: Normal   Collection Time   10/12/11  7:52 PM      Component Value Range Status Comment   Specimen Description URINE, CATHETERIZED   Final    Special Requests ADDED 10/12/11 2017  Final    Culture  Setup Time [id number]   Final    Colony Count >=100,000 COLONIES/ML   Final    Culture ESCHERICHIA COLI   Final    Report Status 10/15/2011 FINAL   Final    Organism ID, Bacteria ESCHERICHIA COLI   Final   CULTURE, BLOOD (ROUTINE X 2)     Status: Normal   Collection Time   10/12/11  8:00 PM      Component Value Range Status Comment   Specimen Description BLOOD HAND LEFT   Final    Special Requests     Final    Value: BOTTLES DRAWN AEROBIC AND ANAEROBIC AERO 10CC, ANAE 5CC   Culture  Setup Time      Final    Value: [id number] This organism DOES NOT demonstrate inducible Clindamycin resistance in vitro.   Culture     Final    Value: CLOSTRIDIUM PERFRINGENS     STREPTOCOCCUS SPECIES     Note: Identified as Streptococcus alactolyticus     STAPHYLOCOCCUS SPECIES (COAGULASE NEGATIVE)     Note: RIFAMPIN AND GENTAMICIN SHOULD NOT BE USED AS SINGLE DRUGS FOR TREATMENT OF STAPH INFECTIONS. This organism DOES NOT demonstrate inducible Clindamycin resistance in vitro.   Report Status 10/21/2011 FINAL   Final    Organism ID, Bacteria STAPHYLOCOCCUS SPECIES (COAGULASE NEGATIVE)   Final    Organism ID, Bacteria STREPTOCOCCUS SPECIES   Final    Organism ID, Bacteria STREPTOCOCCUS SPECIES   Final    Organism ID, Bacteria STAPHYLOCOCCUS SPECIES (COAGULASE NEGATIVE)   Final   CULTURE, BLOOD (ROUTINE X 2)     Status: Normal   Collection Time   10/12/11  8:13 PM      Component Value Range Status Comment   Specimen Description BLOOD HAND RIGHT   Final    Special Requests BOTTLES DRAWN AEROBIC ONLY 10CC   Final    Culture  Setup Time [id number]   Final    Culture     Final    Value: STREPTOCOCCUS SPECIES     STAPHYLOCOCCUS SPECIES (COAGULASE NEGATIVE)     Note: Identified as Streptococcus alactolyticus SUSCEPTIBILITIES PERFORMED ON PREVIOUS CULTURE WITHIN THE LAST 5 DAYS.     Note: Gram Stain Report Called to,Read Back By and Verified With: LAUREN CONNALLY @ 1828 ON 10/13/11 BY GOLLD   Report Status 10/17/2011 FINAL   Final   CATH TIP CULTURE     Status: Normal   Collection Time   10/13/11  3:47 AM      Component Value Range Status Comment   Specimen Description CATH TIP   Final    Special Requests NONE   Final    Culture     Final    Value: >100 COLONIES STAPHYLOCOCCUS SPECIES (COAGULASE NEGATIVE)     Note: RIFAMPIN AND GENTAMICIN SHOULD NOT BE USED AS SINGLE DRUGS FOR TREATMENT OF STAPH INFECTIONS. This organism DOES NOT demonstrate inducible Clindamycin resistance in vitro.   Report Status  10/16/2011 FINAL   Final    Organism ID, Bacteria STAPHYLOCOCCUS SPECIES (COAGULASE NEGATIVE)   Final   MRSA PCR SCREENING     Status: Normal   Collection Time   10/13/11  3:51 AM      Component Value Range Status Comment   MRSA by PCR NEGATIVE  NEGATIVE  Final   MRSA PCR SCREENING     Status: Normal   Collection Time   10/13/11  3:35 PM      Component Value  Range Status Comment   MRSA by PCR NEGATIVE  NEGATIVE  Final   CULTURE, BLOOD (ROUTINE X 2)     Status: Normal   Collection Time   10/13/11  5:00 PM      Component Value Range Status Comment   Specimen Description BLOOD RIGHT ARM   Final    Special Requests BOTTLES DRAWN AEROBIC ONLY 10CC   Final    Culture  Setup Time [id number]   Final    Culture     Final    Value: STAPHYLOCOCCUS SPECIES (COAGULASE NEGATIVE)     Note: SUSCEPTIBILITIES PERFORMED ON PREVIOUS CULTURE WITHIN THE LAST 5 DAYS.     Note: Gram Stain Report Called to,Read Back By and Verified With: MARK SIVNGE @0436  ON 10/16/11 BY MCLET   Report Status 10/17/2011 FINAL   Final   CULTURE, BLOOD (ROUTINE X 2)     Status: Normal   Collection Time   10/13/11  5:08 PM      Component Value Range Status Comment   Specimen Description BLOOD RIGHT HAND   Final    Special Requests BOTTLES DRAWN AEROBIC ONLY 5CC   Final    Culture  Setup Time [id number]   Final    Culture     Final    Value: STAPHYLOCOCCUS SPECIES (COAGULASE NEGATIVE)     Note: SUSCEPTIBILITIES PERFORMED ON PREVIOUS CULTURE WITHIN THE LAST 5 DAYS.     Note: Gram Stain Report Called to,Read Back By and Verified With: ASHLEY HARRISON @0627  ON 10/15/11 BY MCLET   Report Status 10/16/2011 FINAL   Final   URINE CULTURE     Status: Normal   Collection Time   10/13/11  5:12 PM      Component Value Range Status Comment   Specimen Description URINE, CATHETERIZED   Final    Special Requests NONE   Final    Culture  Setup Time [id number]   Final    Colony Count NO GROWTH   Final    Culture NO GROWTH   Final     Report Status 10/14/2011 FINAL   Final   CULTURE, BLOOD (ROUTINE X 2)     Status: Normal (Preliminary result)   Collection Time   10/17/11  9:15 AM      Component Value Range Status Comment   Specimen Description BLOOD RIGHT HAND   Final    Special Requests BOTTLES DRAWN AEROBIC ONLY 10CC   Final    Culture  Setup Time [id number]   Final    Culture     Final    Value:        BLOOD CULTURE RECEIVED NO GROWTH TO DATE CULTURE WILL BE HELD FOR 5 DAYS BEFORE ISSUING A FINAL NEGATIVE REPORT   Report Status PENDING   Incomplete   CULTURE, BLOOD (ROUTINE X 2)     Status: Normal (Preliminary result)   Collection Time   10/17/11  9:25 AM      Component Value Range Status Comment   Specimen Description BLOOD LEFT HAND   Final    Special Requests BOTTLES DRAWN AEROBIC ONLY [facility]   Final    Culture  Setup Time [id number]   Final    Culture     Final    Value:        BLOOD CULTURE RECEIVED NO GROWTH TO DATE CULTURE WILL BE HELD FOR 5 DAYS BEFORE ISSUING A FINAL NEGATIVE REPORT   Report Status PENDING   Incomplete  Studies/Results: Ct Humerus Right W Contrast  10/16/2011  *RADIOLOGY REPORT*  Clinical Data: Upper extremity venous thrombosis.  Redness about the elbow.  Pain.  CT OF THE RIGHT HUMERUS WITH CONTRAST  Technique:  Multidetector CT imaging was performed following the standard protocol during bolus administration of intravenous contrast.  Contrast: 80mL OMNIPAQUE IOHEXOL 300 MG/ML  SOLN  Comparison: Plain films of the elbow 12/09/2007. Report of upper extremity ultrasound 10/14/2011 reviewed.  Findings: As described report of comparison ultrasound, clot is seen in the left subclavian, axillary and brachial veins. Subclavian vein is not well demonstrated on this examination as it is not scanned in its entirety.  Clot is well demonstrated extending to a point 18 cm below the top of the humeral head. Extensive subcutaneous edema is present.  There is no focal fluid collection to suggest abscess.   No focal bony abnormality is identified.  There is some dependent atelectasis in the right lung. Imaged intra-abdominal contents are unremarkable.  IMPRESSION: Clot in the left subclavian, axillary and brachial veins as described above with associated subcutaneous edema.  Negative for abscess.  Original Report Authenticated By: Bernadene Bell. Maricela Curet, M.D.   Ct Abdomen Pelvis W Contrast  10/13/2011  *RADIOLOGY REPORT*  Clinical Data: 78 year old with sepsis.  CT ABDOMEN AND PELVIS WITH CONTRAST  Technique:  Multidetector CT imaging of the abdomen and pelvis was performed following the standard protocol during bolus administration of intravenous contrast.  Contrast: 80mL OMNIPAQUE IOHEXOL 300 MG/ML  SOLN  Comparison: 07/29/2011  Findings: There are tiny bilateral pleural effusions with basilar atelectasis.  A small amount of focal atelectasis or round atelectasis at the right lung base on sequence 3, image 7.  No evidence for free air.  Coronary artery calcifications.  The gallbladder has been removed. Again noted is low density fluid or postsurgical changes between the stomach and left hepatic lobe, best seen on sequence 2, image 23.  This area roughly measures 2.1 cm and similar to the prior examination.  There is residual fluid or edema in the gallbladder fossa and along the inferior liver margin.  No evidence for a drainable fluid collection.  Gastrostomy tube is present.  Again noted is a large calcification near the pancreatic head and neck. There is oral contrast within the stomach and large bowel.  No gross abnormality to the liver, portal venous system, adrenal glands, kidneys or spleen.  Again noted are low density structures within the kidneys which are too small to definitively characterize but could represent cysts.  Calcifications associated with the uterus and cannot exclude fibroids.  No significant free fluid in the pelvis.  There is a right hip replacement.  No significant lymphadenopathy. A Foley  catheter within the urinary bladder.  IMPRESSION:  Postsurgical changes in the upper abdomen.  Residual low density fluid or edema around the liver has minimally changed since 07/29/2011.  Tiny pleural effusions with bibasilar atelectasis.  Original Report Authenticated By: Richarda Overlie, M.D.   Ir Fluoro Guide Cv Line Right  09/29/2011  *RADIOLOGY REPORT*  Clinical Data: Diabetes  PICC LINE PLACEMENT WITH ULTRASOUND AND FLUOROSCOPIC  GUIDANCE  Fluoroscopy Time: 0.5 minutes.  The right arm was prepped with chlorhexidine, draped in the usual sterile fashion using maximum barrier technique (cap and mask, sterile gown, sterile gloves, large sterile sheet, hand hygiene and cutaneous antisepsis) and infiltrated locally with 1% Lidocaine.  Ultrasound demonstrated patency of the right basilic vein, and this was documented with an image.  Under real-time ultrasound guidance, this vein  was accessed with a 21 gauge micropuncture needle and image documentation was performed.  The needle was exchanged over a guidewire for a peel-away sheath through which a five [place] double lumen PICC trimmed to 40 cm was advanced, positioned with its tip at the lower SVC/right atrial junction.  Fluoroscopy during the procedure and fluoro spot radiograph confirms appropriate catheter position.  The catheter was flushed, secured to the skin with Prolene sutures, and covered with a sterile dressing.  Complications:  None  IMPRESSION: Successful right arm PICC line placement with ultrasound and fluoroscopic guidance.  The catheter is ready for use.  Original Report Authenticated By: Donavan Burnet, M.D.   Ir US Guide Vasc Access Right  09/29/2011  *RADIOLOGY REPORT*  Clinical Data: Diabetes  PICC LINE PLACEMENT WITH ULTRASOUND AND FLUOROSCOPIC  GUIDANCE  Fluoroscopy Time: 0.5 minutes.  The right arm was prepped with chlorhexidine, draped in the usual sterile fashion using maximum barrier technique (cap and mask, sterile gown, sterile gloves,  large sterile sheet, hand hygiene and cutaneous antisepsis) and infiltrated locally with 1% Lidocaine.  Ultrasound demonstrated patency of the right basilic vein, and this was documented with an image.  Under real-time ultrasound guidance, this vein was accessed with a 21 gauge micropuncture needle and image documentation was performed.  The needle was exchanged over a guidewire for a peel-away sheath through which a five [place] double lumen PICC trimmed to 40 cm was advanced, positioned with its tip at the lower SVC/right atrial junction.  Fluoroscopy during the procedure and fluoro spot radiograph confirms appropriate catheter position.  The catheter was flushed, secured to the skin with Prolene sutures, and covered with a sterile dressing.  Complications:  None  IMPRESSION: Successful right arm PICC line placement with ultrasound and fluoroscopic guidance.  The catheter is ready for use.  Original Report Authenticated By: Donavan Burnet, M.D.   Dg Chest Port 1 View  10/13/2011  *RADIOLOGY REPORT*  Clinical Data: Line placement.  PORTABLE CHEST - 1 VIEW  Comparison: 10/12/2011  Findings: Cardiomegaly with vascular congestion.  Diffuse interstitial prominence may reflect early interstitial edema.  No confluent opacities or effusions.  No acute bony abnormality.  Left central line is in place.  The tip is in the upper right atrium.  No pneumothorax.  IMPRESSION: Cardiomegaly, vascular congestion.  Question early interstitial edema.  Original Report Authenticated By: Cyndie Chime, M.D.   Dg Chest Port 1 View  10/12/2011  *RADIOLOGY REPORT*  Clinical Data: Fever.  Drainage about a PICC.  PORTABLE CHEST - 1 VIEW  Comparison: Fluoroscopic spot view from PICC placement 09/29/2011. Plain films of the chest 07/28/2011.  CT chest 01/26/2011.  Findings: The patient's right PICC has backed out with the tip in the right axilla.  Lungs are clear.  Heart size is normal.  No pneumothorax or pleural effusion.  IMPRESSION:   1.  Tip of the patient's right PICC is now in the right axilla. 2.  No acute cardiopulmonary disease.  Original Report Authenticated By: Bernadene Bell. Maricela Curet, M.D.   Dg Abd Portable 2v  10/13/2011  *RADIOLOGY REPORT*  Clinical Data: Abdominal pain.  PORTABLE ABDOMEN - 2 VIEW  Comparison: CT 10/13/2011  Findings: Oral contrast material seen within the colon.  There is a nonobstructive bowel gas pattern.  No free air on the decubitus views.  No organomegaly.  IMPRESSION: No obstruction or free air.  Original Report Authenticated By: Cyndie Chime, M.D.    Medications: Scheduled Meds:    .  cefTRIAXone (ROCEPHIN)  IV  2 g Intravenous Q24H  . fentaNYL  25 mcg Transdermal Q72H  . insulin aspart  0-9 Units Subcutaneous Q4H  . magnesium sulfate 1 - 4 g bolus IVPB  2 g Intravenous Once  . metoprolol  5 mg Intravenous Once  . metroNIDAZOLE  500 mg Oral Q8H  . pantoprazole (PROTONIX) IV  40 mg Intravenous Q24H  . potassium chloride  10 mEq Intravenous Q1 Hr x 4  . sodium chloride  10-40 mL Intracatheter Q12H  . vancomycin  1,250 mg Intravenous Q12H   Continuous Infusions:    . sodium chloride 500 mL (10/20/11 1127)  . TPN (CLINIMIX) +/- additives 80 mL/hr at 10/20/11 1734   And  . fat emulsion 250 mL (10/20/11 1734)  . TPN (CLINIMIX) +/- additives 80 mL/hr at 10/21/11 1758   And  . fat emulsion 250 mL (10/21/11 1759)  . TPN (CLINIMIX) +/- additives     And  . fat emulsion    . heparin 1,700 Units/hr (10/22/11 0840)     Assessment/Plan:  Sepsis, Coag negative staph and strept Bacteremia,( PCN sensitive Strep BOVIS species from 2/2 admission cultures, and a Clostridium perfringens species from 1/2 cultures per ID):  - Continue vancomycin and Rocephin per ID  - ID is following, reviewed Dr. Lattie Haw recommendations, ordered medical records from Dr. Raj Janus' office (patient's gastroenterologist on chart review) for any prior endoscopies - TEE negative for any  endocarditis  Hypertension:  - Placed on clonidine patch, restart her Norvasc and beta blocker via Gtube  DIABETES MELLITUS, TYPE [id number] SSI , stable  Hyponatremia: Stable for now, Sec to GI losses.   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 - Per general surgery, she can be given a regular diet from surgical standpoint for palliation. However patient failed with every consistency with a swallow evaluation -I have consulted palliative medicine for goals of care given the complexity of the medical condition and future guidelines.   DVT of Right upper extremity: Cont IV Heparin   Escherichia coli UTI (lower urinary tract infection): Most likely colonization as per ID   H/O C diff:Treated in February:No diarrhea at this time   Hypokalemia: Replaced    Chronic pain: On Fentanyl patch 25 mcg/hr Q 72 hr.   Dementia; pleasantly confused    DVT Prophylaxis: On heparin drip  Code Status: Full code  Disposition: Not medically ready, await palliative medicine goals of care    LOS: 10 days   , M.D. [facility] 10/22/2011, 10:48 AM Pager: [phone]

## 2011-10-22 NOTE — Progress Notes (Signed)
10/22/2011 Audre Cenci SPARKS Case Management Note [phone]  Utilization review completed.  

## 2011-10-22 NOTE — Progress Notes (Addendum)
PARENTERAL NUTRITION CONSULT NOTE - FOLLOW UP   Pharmacy Consult for TPN Indication: chronic TPN at home for short - gut syndrome  Allergies  Allergen Reactions  . Other Swelling    Pinto beans cause swelling and hives  . Penicillins Swelling    Patient Measurements: Height: 5\' 5"  (165.1 cm) Weight: 177 lb 7.5 oz (80.5 kg) IBW/kg (Calculated) : 57  Usual Weight: 162 lbs  Vital Signs: Temp: 99.4 F (37.4 C) (04/25 0618) Temp src: Oral (04/25 0618) BP: 177/59 mmHg (04/25 0618) Pulse Rate: 102  (04/25 0618) Intake/Output from previous day: 04/24 0701 - 04/25 0700 In: -  Out: 2650 [Urine:2650]  Labs:  [facility] 10/22/11 0645 10/21/11 1325 10/20/11 1230  WBC 5.7 6.3 6.9  HGB 9.2* 9.3* 9.1*  HCT 28.1* 27.9* 27.4*  PLT 309 281 287  APTT -- -- --  INR -- -- --     Basename 10/21/11 1325 10/19/11 1125  NA 132* 130*  K 3.5 3.7  CL 99 100  CO2 24 22  GLUCOSE 150* 126*  BUN 12 13  CREATININE 0.48* 0.54  LABCREA -- --  CREAT24HRUR -- --  CALCIUM 9.0 8.5  MG -- 1.7  PHOS -- 4.2  PROT -- 6.0  ALBUMIN -- 1.9*  AST -- 10  ALT -- 10  ALKPHOS -- 89  BILITOT -- 0.2*  BILIDIR -- --  IBILI -- --  PREALBUMIN -- 8.2*  TRIG -- 82  CHOLHDL -- --  CHOL -- 88   Estimated Creatinine Clearance: 60.8 ml/min (by C-G formula based on Cr of 0.48).    Basename 10/22/11 0410 10/22/11 0031 10/21/11 2010  GLUCAP 133* 125* 164*     Insulin Requirements in the past 24 hours:  5 units Novolog SSI and 50 unit regular insulin in TPN  Current Nutrition: Ice chips   Nutritional Goals:  1650-1850 kCal, 85-100 grams of protein per day   Assessment:  5 YOF with h/o short-gut syndrome admitted from [facility] where she is on chronic TPN and takes POs only for pleasure.   PICC line placed in left arm.  Admit: abd pain and fever/purulent discharge from PICC line GI: erosive esophagitis with epigastric abscess and perf pyloric ulcer s/p ex-lap 05/06/11, s/p gastrostomy,  gastrectomy, cholecystectomy, 11/26 jejunal-ileal fistula, gastroileostomy resulting in short gut, PEG to suction, on ice chips.  Severe risk for aspiration and Speech recommends continuing on TNA. Endo: hx DM, CBGs 125-164, adequately controlled on SSI and insulin in TPN (was on metformin + 65 units insulin daily PTA) Lytes: mild hypokalemia, persistent mild hyponatremia--unable to correct with pre-made Clinimix, corrected Calcium toward high end of normal (10.4), magnesium at low end of goal Renal: SCr stable, NS at [facility], ?accuracy of I/O's Pulm: stable on RA Cards: hx HTN - BP elevated, HR 90-100's, on Lopressor PRN. Heparin gtt for new DVT Hepatobil: LFTs/tbili WNL, hgb/plts stable Neuro: hx depression (seroquel / trazodone PTA), on fentanyl patch ID:  Vanc D#[id number], CTX MD#3, Flagyl MD#2 (s/p Fortaz, Flagyl, gent, Cefepime, Azactam) for septic thrombophlebitis, CoNS + Strep bovis bacteremia, and E.coli UTI.  Afebrile today, WBC WNL, TEE negative for vegetations, ID recommends at least 4 weeks of antibiotics. Best Practices: PPI IV, heparin gtt   Plan:  - Continue Clinimix E 5/15 at 80 ml/hr with lipids at 8 ml/hr, to closely match home formula and meeting 100% of patient's needs  - MVI and TE MWF only due to national shortage  - KCL x 4 runs -  Magnesium sulfate 2gm IV x 1 - Monitor I/O's, lytes     Deshara Rossi D. Laney Potash, PharmD, BCPS Pager:  [phone] 10/22/2011, 7:39 AM

## 2011-10-22 NOTE — Progress Notes (Signed)
ANTIBIOTIC and ANTICOAGULATION CONSULT NOTE - Follow Up Consult  Pharmacy Consult for Heparin Indication: DVT at site of PICC; CNS and strep bacteremia    Allergies  Allergen Reactions  . Other Swelling    Pinto beans cause swelling and hives  . Penicillins Swelling    Patient Measurements: Height: 5\' 5"  (165.1 cm) Weight: 177 lb 7.5 oz (80.5 kg) IBW/kg (Calculated) : 57  Heparin Dosing Weight: 75 kg  Vital Signs: Temp: 99.4 F (37.4 C) (04/25 0618) Temp src: Oral (04/25 0618) BP: 177/59 mmHg (04/25 0618) Pulse Rate: 102  (04/25 0618)  Labs: Vancomycin Trough =17.8 mcg/ml  @0830  10/22/11   Basename 10/22/11 0645 10/21/11 1325 10/20/11 1230 10/19/11 1125  HGB 9.2* 9.3* -- --  HCT 28.1* 27.9* 27.4* --  PLT 309 281 287 --  APTT -- -- -- --  LABPROT -- -- -- --  INR -- -- -- --  HEPARINUNFRC 0.65 0.68 0.75* --  CREATININE 0.49* 0.48* -- 0.54  CKTOTAL -- -- -- --  CKMB -- -- -- --  TROPONINI -- -- -- --   Estimated Creatinine Clearance: 60.8 ml/min (by C-G formula based on Cr of 0.49).   Medications:  Infusions:     . sodium chloride 500 mL (10/20/11 1127)  . TPN (CLINIMIX) +/- additives 80 mL/hr at 10/20/11 1734   And  . fat emulsion 250 mL (10/20/11 1734)  . TPN (CLINIMIX) +/- additives 80 mL/hr at 10/21/11 1758   And  . fat emulsion 250 mL (10/21/11 1759)  . TPN (CLINIMIX) +/- additives     And  . fat emulsion    . heparin 1,700 Units/hr (10/22/11 0840)    Assessment:  Vancomycin Trough =17.8 mcg/ml  @0830  10/22/11 on 1250 mg IV q12hr for polymicrobial bacteremia (CNS and Strep Bovis) with possible septic thrombophlebitis. Therapeutic trough.  TEE was negative for vegetation. SCr stable. Tm 99.4  ID notes patient is to continue on Ceftriaxone 2gm IV q24h , Flagyl and Vancomycin x minimum of 4 weeks.   Heparin level = 0.65, therapeutic on IV heparin 1700 units/hr for new RUE DVT at PICC site in this 78 y.o female.  Labs drawn from foot stick.  CBC  stable. No bleeding reported.   Goal of Therapy:  Heparin level 0.3-0.7 units/ml Vancomycin trough = 15-20 mcg/ml   Plan:  Continue Vancomycin 1250mg  IV q12hr.  Check weekly vancomycin trough.  Continue IV heparin 1700 units/hr Daily heparin level and CBC What is pllan for long-term po anticoagulation with short gut?  Arman Filter, RPh 10/22/2011,10:07 AM

## 2011-10-23 LAB — CBC
MCH: 26.1 pg (ref 26.0–34.0)
MCHC: 32.1 g/dL (ref 30.0–36.0)
Platelets: 337 10*3/uL (ref 150–400)
RDW: 17.6 % — ABNORMAL HIGH (ref 11.5–15.5)

## 2011-10-23 LAB — GLUCOSE, CAPILLARY: Glucose-Capillary: 153 mg/dL — ABNORMAL HIGH (ref 70–99)

## 2011-10-23 LAB — CULTURE, BLOOD (ROUTINE X 2)
Culture  Setup Time: 201304201217
Culture: NO GROWTH

## 2011-10-23 LAB — BASIC METABOLIC PANEL
Calcium: 9.2 mg/dL (ref 8.4–10.5)
GFR calc Af Amer: >90 mL/min (ref >90)
GFR calc non Af Amer: 88 mL/min — ABNORMAL LOW (ref >90)
Potassium: 3.6 mEq/L (ref 3.5–5.1)
Sodium: 133 mEq/L — ABNORMAL LOW (ref 135–145)

## 2011-10-23 LAB — HEPARIN LEVEL (UNFRACTIONATED): Heparin Unfractionated: 0.44 IU/mL (ref 0.30–0.70)

## 2011-10-23 MED ORDER — FAT EMULSION 20 % IV EMUL
250.0000 mL | INTRAVENOUS | Status: AC
  Administered 2011-10-23: 250 mL via INTRAVENOUS
  Filled 2011-10-23: qty 250

## 2011-10-23 MED ORDER — METOPROLOL TARTRATE 50 MG PO TABS
50.0000 mg | ORAL_TABLET | Freq: Two times a day (BID) | ORAL | Status: DC
  Administered 2011-10-23 – 2011-10-27 (×8): 50 mg
  Filled 2011-10-23 (×9): qty 1

## 2011-10-23 MED ORDER — TRACE MINERALS CR-CU-MN-SE-ZN 10-1000-500-60 MCG/ML IV SOLN
INTRAVENOUS | Status: AC
  Administered 2011-10-23: 17:00:00 via INTRAVENOUS
  Filled 2011-10-23: qty 2000

## 2011-10-23 MED ORDER — MAGNESIUM SULFATE 40 MG/ML IJ SOLN
4.0000 g | Freq: Once | INTRAMUSCULAR | Status: AC
  Administered 2011-10-23: 4 g via INTRAVENOUS
  Filled 2011-10-23 (×2): qty 100

## 2011-10-23 NOTE — Progress Notes (Signed)
PARENTERAL NUTRITION CONSULT NOTE - FOLLOW UP   Pharmacy Consult for TPN Indication: chronic TPN at home for short - gut syndrome  Allergies  Allergen Reactions  . Other Swelling    Pinto beans cause swelling and hives  . Penicillins Swelling    Patient Measurements: Height: 5\' 5"  (165.1 cm) Weight: 177 lb 7.5 oz (80.5 kg) IBW/kg (Calculated) : 57  Usual Weight: 162 lbs  Vital Signs: Temp: 99.2 F (37.3 C) (04/26 0500) BP: 144/74 mmHg (04/26 0500) Pulse Rate: 98  (04/26 0500) Intake/Output from previous day: 04/25 0701 - 04/26 0700 In: -  Out: 3050 [Urine:3050]  Labs:  [facility] 10/23/11 0600 10/22/11 0645 10/21/11 1325  WBC 5.9 5.7 6.3  HGB 9.2* 9.2* 9.3*  HCT 28.7* 28.1* 27.9*  PLT 337 309 281  APTT -- -- --  INR -- -- --     Basename 10/23/11 0600 10/22/11 0645 10/21/11 1325  NA 133* 133* 132*  K 3.6 3.3* 3.5  CL 99 100 99  CO2 24 24 24   GLUCOSE 133* 147* 150*  BUN 12 12 12   CREATININE 0.55 0.49* 0.48*  LABCREA -- -- --  CREAT24HRUR -- -- --  CALCIUM 9.2 8.9 9.0  MG 1.5 1.5 --  PHOS -- 4.6 --  PROT -- 6.3 --  ALBUMIN -- 2.1* --  AST -- 10 --  ALT -- 9 --  ALKPHOS -- 95 --  BILITOT -- 0.1* --  BILIDIR -- -- --  IBILI -- -- --  PREALBUMIN -- -- --  TRIG -- -- --  CHOLHDL -- -- --  CHOL -- -- --   Estimated Creatinine Clearance: 60.8 ml/min (by C-G formula based on Cr of 0.55).    Basename 10/23/11 0446 10/23/11 0033 10/22/11 1920  GLUCAP 141* 153* 168*     Insulin Requirements in the past 24 hours:  6 units Novolog SSI and 50 unit regular insulin in TPN  Current Nutrition: Ice chips   Nutritional Goals:  1650-1850 kCal, 85-100 grams of protein per day   Assessment:  7 YOF with h/o short-gut syndrome admitted from [facility] where she is on chronic TPN and takes POs only for pleasure.   PICC line placed in left arm.  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  Severe risk for aspiration and Speech recommends continuing on TNA. Endo: hx DM, CBGs 113-168, adequately controlled on SSI and insulin in TPN (was on metformin + 65 units insulin daily PTA) Lytes:  persistent mild hyponatremia--unable to correct with pre-made Clinimix, corrected Calcium 10.72, magnesium remains at 1.5 despite 2gm Mag bolus yesterday, Phos x corr Ca = 49 Renal: SCr stable, NS at [facility], UOP 1.6 ml/kg/hr Pulm: stable on RA Cards: hx HTN - BP elevated, HR 90-100's, on Lopressor PRN. Heparin gtt for new DVT Hepatobil: LFTs/tbili WNL, hgb/plts stable Neuro: hx depression (seroquel / trazodone PTA), on fentanyl patch ID:  Vanc D#9, CTX MD#4, Flagyl MD#3(s/p Fortaz, Flagyl, gent, Cefepime, Azactam) for septic thrombophlebitis, CoNS + Strep bovis bacteremia, and E.coli UTI.  Tmax 99.3, WBC WNL, TEE negative for vegetations, ID recommends at least 4 weeks of antibiotics. Best Practices: PPI IV, heparin gtt   Plan:  - Continue Clinimix E 5/15 at 80 ml/hr with lipids at 8 ml/hr, to closely match home formula and  meeting 100% of patient's needs  - MVI and TE MWF only due to national shortage  - Magnesium sulfate 4gm IV x 1 - Monitor I/O's, lytes     Talbert Cage, PharmD Pager:  [phone] 10/23/2011, 7:41 AM

## 2011-10-23 NOTE — Progress Notes (Signed)
SLP Cancellation Note  Await direction from M.D. and Palliative care Goals of Care decisions to determine plan of care for swallowing objective assessment versus signing off.  Myra Rude, M.S.,CCC-SLP Pager [phone] 10/23/2011, 1:26 PM

## 2011-10-23 NOTE — Progress Notes (Signed)
Patient ID: [name]  female  ZOX:[id number]    DOB: [birth year]    DOA: 10/12/2011  PCP: [name], MD, MD  Interim history she has history of perforated duodenal ulcer, status post antrectomy, duodenal resection, Billroth 2 reconstruction, omental patching of Duodenal bulb, open cholecystectomy, gastrostomy and jejunostomy on 05/07/11. Her course was complicated by epigastric abscess 05/31/11. 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 Brought to the ER on 4/15 w/ fever and purulent discharge from PICC insertion site, abd pain and UA concerning for UTI. She was started on broad spectrum abx, ID has been consulted. Blood cultures grew coagulase negative staph and Streptococcus alactolyticus, she also had e coli growing from urine. Repeat blood cultures on 10/17/2011 has been negative so far. At this time the right picc has been removed and she is getting TNA wire peripheral IV. She also has central line in place. Patient has been getting IV fentanyl when necessary for pain.  10/20/2011: General surgery consulted, PEG tube clamped by surgery service 10/21/2011: Patient underwent TEE, did not show any endocarditis. Speech therapy was consulted for swallowing evaluation, however patient immediately aspirated on every consistency.  10/22/2011: I have called for palliative medicine consult for goals of care, patient will remain n.p.o., Continue TNA. I have called for medical records from patient's gastroenterologist, Dr. Raj Janus in [place].  Subjective: Alert and awake, no complaints or issues overnight   Objective: Weight change:   Intake/Output Summary (Last 24 hours) at 10/23/11 1308 Last data filed at 10/23/11 0500  Gross per 24 hour  Intake      0 ml  Output   3050 ml  Net  -3050 ml   Blood pressure 161/69, pulse 99, temperature 98.2 F (36.8 C), temperature source Oral, resp. rate 20, height 5\' 5"  (1.651 m), weight 80.5  kg (177 lb 7.5 oz), SpO2 99.00%.  Physical Exam: General: Alert and awake, not in any acute distress. HEENT: anicteric sclera, pupils reactive to light and accommodation, EOMI CVS: S1-S2 clear, no murmur rubs or gallops Chest: clear to auscultation bilaterally, no wheezing, rales or rhonchi Abdomen: soft nontender, PEG tube clamped Extremities: no cyanosis, clubbing or edema noted bilaterally   Lab Results: Basic Metabolic Panel:  Lab 10/23/11 8119 10/22/11 0645  NA 133* 133*  K 3.6 3.3*  CL 99 100  CO2 24 24  GLUCOSE 133* 147*  BUN 12 12  CREATININE 0.55 0.49*  CALCIUM 9.2 8.9  MG 1.5 --  PHOS -- 4.6   Liver Function Tests:  Lab 10/22/11 0645 10/19/11 1125  AST 10 10  ALT 9 10  ALKPHOS 95 89  BILITOT 0.1* 0.2*  PROT 6.3 6.0  ALBUMIN 2.1* 1.9*     Lab 10/23/11 0600 10/22/11 0645 10/19/11 1125  WBC 5.9 5.7 --  NEUTROABS -- -- 3.3  HGB 9.2* 9.2* --  HCT 28.7* 28.1* --  MCV 81.3 81.0 --  PLT 337 309 --  CBG:  Lab 10/23/11 1204 10/23/11 0827 10/23/11 0446 10/23/11 0033 10/22/11 1920  GLUCAP 135* 157* 141* 153* 168*     Micro Results: Recent Results (from the past 240 hour(s))  MRSA PCR SCREENING     Status: Normal   Collection Time   10/13/11  3:35 PM      Component Value Range Status Comment   MRSA by PCR NEGATIVE  NEGATIVE  Final   CULTURE, BLOOD (ROUTINE X 2)     Status: Normal  Collection Time   10/13/11  5:00 PM      Component Value Range Status Comment   Specimen Description BLOOD RIGHT ARM   Final    Special Requests BOTTLES DRAWN AEROBIC ONLY 10CC   Final    Culture  Setup Time [id number]   Final    Culture     Final    Value: STAPHYLOCOCCUS SPECIES (COAGULASE NEGATIVE)     Note: SUSCEPTIBILITIES PERFORMED ON PREVIOUS CULTURE WITHIN THE LAST 5 DAYS.     Note: Gram Stain Report Called to,Read Back By and Verified With: MARK SIVNGE @0436  ON 10/16/11 BY MCLET   Report Status 10/17/2011 FINAL   Final   CULTURE, BLOOD (ROUTINE X 2)     Status:  Normal   Collection Time   10/13/11  5:08 PM      Component Value Range Status Comment   Specimen Description BLOOD RIGHT HAND   Final    Special Requests BOTTLES DRAWN AEROBIC ONLY 5CC   Final    Culture  Setup Time [id number]   Final    Culture     Final    Value: STAPHYLOCOCCUS SPECIES (COAGULASE NEGATIVE)     Note: SUSCEPTIBILITIES PERFORMED ON PREVIOUS CULTURE WITHIN THE LAST 5 DAYS.     Note: Gram Stain Report Called to,Read Back By and Verified With: ASHLEY HARRISON @0627  ON 10/15/11 BY MCLET   Report Status 10/16/2011 FINAL   Final   URINE CULTURE     Status: Normal   Collection Time   10/13/11  5:12 PM      Component Value Range Status Comment   Specimen Description URINE, CATHETERIZED   Final    Special Requests NONE   Final    Culture  Setup Time [id number]   Final    Colony Count NO GROWTH   Final    Culture NO GROWTH   Final    Report Status 10/14/2011 FINAL   Final   CULTURE, BLOOD (ROUTINE X 2)     Status: Normal   Collection Time   10/17/11  9:15 AM      Component Value Range Status Comment   Specimen Description BLOOD RIGHT HAND   Final    Special Requests BOTTLES DRAWN AEROBIC ONLY 10CC   Final    Culture  Setup Time [id number]   Final    Culture NO GROWTH 5 DAYS   Final    Report Status 10/23/2011 FINAL   Final   CULTURE, BLOOD (ROUTINE X 2)     Status: Normal   Collection Time   10/17/11  9:25 AM      Component Value Range Status Comment   Specimen Description BLOOD LEFT HAND   Final    Special Requests BOTTLES DRAWN AEROBIC ONLY [facility]   Final    Culture  Setup Time [id number]   Final    Culture NO GROWTH 5 DAYS   Final    Report Status 10/23/2011 FINAL   Final     Studies/Results: Ct Humerus Right W Contrast  10/16/2011  *RADIOLOGY REPORT*  Clinical Data: Upper extremity venous thrombosis.  Redness about the elbow.  Pain.  CT OF THE RIGHT HUMERUS WITH CONTRAST  Technique:  Multidetector CT imaging was performed following the standard protocol during  bolus administration of intravenous contrast.  Contrast: 80mL OMNIPAQUE IOHEXOL 300 MG/ML  SOLN  Comparison: Plain films of the elbow 12/09/2007. Report of upper extremity ultrasound 10/14/2011 reviewed.  Findings: As described report of comparison ultrasound, clot is  seen in the left subclavian, axillary and brachial veins. Subclavian vein is not well demonstrated on this examination as it is not scanned in its entirety.  Clot is well demonstrated extending to a point 18 cm below the top of the humeral head. Extensive subcutaneous edema is present.  There is no focal fluid collection to suggest abscess.  No focal bony abnormality is identified.  There is some dependent atelectasis in the right lung. Imaged intra-abdominal contents are unremarkable.  IMPRESSION: Clot in the left subclavian, axillary and brachial veins as described above with associated subcutaneous edema.  Negative for abscess.  Original Report Authenticated By: Bernadene Bell. Maricela Curet, M.D.   Ct Abdomen Pelvis W Contrast  10/13/2011  *RADIOLOGY REPORT*  Clinical Data: 78 year old with sepsis.  CT ABDOMEN AND PELVIS WITH CONTRAST  Technique:  Multidetector CT imaging of the abdomen and pelvis was performed following the standard protocol during bolus administration of intravenous contrast.  Contrast: 80mL OMNIPAQUE IOHEXOL 300 MG/ML  SOLN  Comparison: 07/29/2011  Findings: There are tiny bilateral pleural effusions with basilar atelectasis.  A small amount of focal atelectasis or round atelectasis at the right lung base on sequence 3, image 7.  No evidence for free air.  Coronary artery calcifications.  The gallbladder has been removed. Again noted is low density fluid or postsurgical changes between the stomach and left hepatic lobe, best seen on sequence 2, image 23.  This area roughly measures 2.1 cm and similar to the prior examination.  There is residual fluid or edema in the gallbladder fossa and along the inferior liver margin.  No evidence  for a drainable fluid collection.  Gastrostomy tube is present.  Again noted is a large calcification near the pancreatic head and neck. There is oral contrast within the stomach and large bowel.  No gross abnormality to the liver, portal venous system, adrenal glands, kidneys or spleen.  Again noted are low density structures within the kidneys which are too small to definitively characterize but could represent cysts.  Calcifications associated with the uterus and cannot exclude fibroids.  No significant free fluid in the pelvis.  There is a right hip replacement.  No significant lymphadenopathy. A Foley catheter within the urinary bladder.  IMPRESSION:  Postsurgical changes in the upper abdomen.  Residual low density fluid or edema around the liver has minimally changed since 07/29/2011.  Tiny pleural effusions with bibasilar atelectasis.  Original Report Authenticated By: Richarda Overlie, M.D.   Ir Fluoro Guide Cv Line Right  09/29/2011  *RADIOLOGY REPORT*  Clinical Data: Diabetes  PICC LINE PLACEMENT WITH ULTRASOUND AND FLUOROSCOPIC  GUIDANCE  Fluoroscopy Time: 0.5 minutes.  The right arm was prepped with chlorhexidine, draped in the usual sterile fashion using maximum barrier technique (cap and mask, sterile gown, sterile gloves, large sterile sheet, hand hygiene and cutaneous antisepsis) and infiltrated locally with 1% Lidocaine.  Ultrasound demonstrated patency of the right basilic vein, and this was documented with an image.  Under real-time ultrasound guidance, this vein was accessed with a 21 gauge micropuncture needle and image documentation was performed.  The needle was exchanged over a guidewire for a peel-away sheath through which a five [place] double lumen PICC trimmed to 40 cm was advanced, positioned with its tip at the lower SVC/right atrial junction.  Fluoroscopy during the procedure and fluoro spot radiograph confirms appropriate catheter position.  The catheter was flushed, secured to the skin  with Prolene sutures, and covered with a sterile dressing.  Complications:  None  IMPRESSION: Successful right arm PICC line placement with ultrasound and fluoroscopic guidance.  The catheter is ready for use.  Original Report Authenticated By: Donavan Burnet, M.D.   Ir US Guide Vasc Access Right  09/29/2011  *RADIOLOGY REPORT*  Clinical Data: Diabetes  PICC LINE PLACEMENT WITH ULTRASOUND AND FLUOROSCOPIC  GUIDANCE  Fluoroscopy Time: 0.5 minutes.  The right arm was prepped with chlorhexidine, draped in the usual sterile fashion using maximum barrier technique (cap and mask, sterile gown, sterile gloves, large sterile sheet, hand hygiene and cutaneous antisepsis) and infiltrated locally with 1% Lidocaine.  Ultrasound demonstrated patency of the right basilic vein, and this was documented with an image.  Under real-time ultrasound guidance, this vein was accessed with a 21 gauge micropuncture needle and image documentation was performed.  The needle was exchanged over a guidewire for a peel-away sheath through which a five [place] double lumen PICC trimmed to 40 cm was advanced, positioned with its tip at the lower SVC/right atrial junction.  Fluoroscopy during the procedure and fluoro spot radiograph confirms appropriate catheter position.  The catheter was flushed, secured to the skin with Prolene sutures, and covered with a sterile dressing.  Complications:  None  IMPRESSION: Successful right arm PICC line placement with ultrasound and fluoroscopic guidance.  The catheter is ready for use.  Original Report Authenticated By: Donavan Burnet, M.D.   Dg Chest Port 1 View  10/13/2011  *RADIOLOGY REPORT*  Clinical Data: Line placement.  PORTABLE CHEST - 1 VIEW  Comparison: 10/12/2011  Findings: Cardiomegaly with vascular congestion.  Diffuse interstitial prominence may reflect early interstitial edema.  No confluent opacities or effusions.  No acute bony abnormality.  Left central line is in place.  The tip is in the  upper right atrium.  No pneumothorax.  IMPRESSION: Cardiomegaly, vascular congestion.  Question early interstitial edema.  Original Report Authenticated By: Cyndie Chime, M.D.   Dg Chest Port 1 View  10/12/2011  *RADIOLOGY REPORT*  Clinical Data: Fever.  Drainage about a PICC.  PORTABLE CHEST - 1 VIEW  Comparison: Fluoroscopic spot view from PICC placement 09/29/2011. Plain films of the chest 07/28/2011.  CT chest 01/26/2011.  Findings: The patient's right PICC has backed out with the tip in the right axilla.  Lungs are clear.  Heart size is normal.  No pneumothorax or pleural effusion.  IMPRESSION:  1.  Tip of the patient's right PICC is now in the right axilla. 2.  No acute cardiopulmonary disease.  Original Report Authenticated By: Bernadene Bell. Maricela Curet, M.D.   Dg Abd Portable 2v  10/13/2011  *RADIOLOGY REPORT*  Clinical Data: Abdominal pain.  PORTABLE ABDOMEN - 2 VIEW  Comparison: CT 10/13/2011  Findings: Oral contrast material seen within the colon.  There is a nonobstructive bowel gas pattern.  No free air on the decubitus views.  No organomegaly.  IMPRESSION: No obstruction or free air.  Original Report Authenticated By: Cyndie Chime, M.D.    Medications: Scheduled Meds:    . amLODipine  5 mg Per Tube Daily  . cefTRIAXone (ROCEPHIN)  IV  2 g Intravenous Q24H  . cloNIDine  0.2 mg Transdermal Weekly  . fentaNYL  25 mcg Transdermal Q72H  . insulin aspart  0-9 Units Subcutaneous Q4H  . magnesium sulfate 1 - 4 g bolus IVPB  4 g Intravenous Once  . metoprolol  5 mg Intravenous Once  . metoprolol tartrate  25 mg Per Tube BID  . metroNIDAZOLE  500 mg Oral Q8H  .  pantoprazole (PROTONIX) IV  40 mg Intravenous Q24H  . potassium chloride  10 mEq Intravenous Q1 Hr x 4  . sodium chloride  10-40 mL Intracatheter Q12H  . vancomycin  1,250 mg Intravenous Q12H   Continuous Infusions:    . sodium chloride 500 mL (10/20/11 1127)  . TPN (CLINIMIX) +/- additives 80 mL/hr at 10/21/11 1758   And  .  fat emulsion 250 mL (10/21/11 1759)  . TPN (CLINIMIX) +/- additives 80 mL/hr at 10/22/11 1732   And  . fat emulsion 250 mL (10/22/11 1732)  . TPN (CLINIMIX) +/- additives     And  . fat emulsion    . heparin 1,700 Units/hr (10/23/11 0119)     Assessment/Plan:  Sepsis, Coag negative staph and strept Bacteremia,( PCN sensitive Strep BOVIS species from 2/2 admission cultures, and a Clostridium perfringens species from 1/2 cultures per ID, Dr Daiva Eves):  - Continue vancomycin and Rocephin per ID  - ID is following, reviewed Dr. Lattie Haw recommendations, medical records from Dr. Raj Janus' office pending  - TEE negative for any endocarditis  Hypertension:  -Continue clonidine patch, Norvasc and beta blocker (increased to 50 mg twice a day) via Gtube  DIABETES MELLITUS, TYPE NW:GNFA SSI , stable  Hyponatremia: Stable for now, Sec to GI losses.   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 - palliative medicine consulted for goals of care given the complexity of the medical condition and future guidelines.   DVT of Right upper extremity: Cont IV Heparin   Escherichia coli UTI (lower urinary tract infection): Most likely colonization as per ID   H/O C diff:Treated in February:No diarrhea at this time   Chronic pain: On Fentanyl patch 25 mcg/hr Q 72 hr.   Dementia; pleasantly confused    DVT Prophylaxis: On heparin drip  Code Status: Full code  Disposition: Not medically ready, await palliative medicine goals of care. Also exploring the possibility of LTAC as patient appears to be stable but needs continuous medical management.   LOS: 11 days   Johanna Stafford M.D. [facility] 10/23/2011, 1:08 PM Pager: [phone]

## 2011-10-23 NOTE — Progress Notes (Signed)
ANTICOAGULATION CONSULT NOTE - Follow Up Consult  Pharmacy Consult for Heparin Indication: DVT at site of PICC    Allergies  Allergen Reactions  . Other Swelling    Pinto beans cause swelling and hives  . Penicillins Swelling    Patient Measurements: Height: 5\' 5"  (165.1 cm) Weight: 177 lb 7.5 oz (80.5 kg) IBW/kg (Calculated) : 57  Heparin Dosing Weight: 75 kg  Vital Signs: Temp: 98.2 F (36.8 C) (04/26 1100) Temp src: Oral (04/26 1100) BP: 161/69 mmHg (04/26 1100) Pulse Rate: 99  (04/26 1100)  Labs: Vancomycin Trough =17.8 mcg/ml  @0830  10/22/11   Basename 10/23/11 0600 10/22/11 0645 10/21/11 1325  HGB 9.2* 9.2* --  HCT 28.7* 28.1* 27.9*  PLT 337 309 281  APTT -- -- --  LABPROT -- -- --  INR -- -- --  HEPARINUNFRC 0.44 0.65 0.68  CREATININE 0.55 0.49* 0.48*  CKTOTAL -- -- --  CKMB -- -- --  TROPONINI -- -- --   Estimated Creatinine Clearance: 60.8 ml/min (by C-G formula based on Cr of 0.55).   Medications:  Infusions:     . sodium chloride 500 mL (10/20/11 1127)  . TPN (CLINIMIX) +/- additives 80 mL/hr at 10/21/11 1758   And  . fat emulsion 250 mL (10/21/11 1759)  . TPN (CLINIMIX) +/- additives 80 mL/hr at 10/22/11 1732   And  . fat emulsion 250 mL (10/22/11 1732)  . TPN (CLINIMIX) +/- additives     And  . fat emulsion    . heparin 1,700 Units/hr (10/23/11 0119)    Assessment:  78yo F on heparin for RUE DVT at PICC site.  Heparin level remains therapeutic (0.44) on 1700 units/hr.  CBC remains stable with no bleeding reported.   Goal of Therapy:  Heparin level 0.3-0.7 units/ml   Plan:  Continue IV heparin 1700 units/hr Daily heparin level and CBC What is pllan for long-term anticoagulation with short gut?  Rolland Porter, Pharm.D., BCPS Clinical Pharmacist Pager: [phone]

## 2011-10-23 NOTE — Progress Notes (Addendum)
Clinical Social Worker continuing to follow for disposition planning. Pt is from [facility]. Clinical Social Worker reviewed chart and noted PMT GOC meeting scheduled for noon on Saturday, April 27. Also per CM, exploring pt appropriateness for LTACH. Pt can return to [facility] and facility can manage pt tpn. Clinical Social Worker to follow up on PMT GOC recommendations and with CM. Clinical Social Worker updated facility. Clinical Social Worker to continue to follow.  Jacklynn Lewis, MSW, LCSWA  Clinical Social Work [phone]

## 2011-10-24 LAB — BASIC METABOLIC PANEL
BUN: 13 mg/dL (ref 6–23)
Chloride: 101 mEq/L (ref 96–112)
GFR calc Af Amer: >90 mL/min (ref >90)
GFR calc non Af Amer: 86 mL/min — ABNORMAL LOW (ref >90)
Glucose, Bld: 136 mg/dL — ABNORMAL HIGH (ref 70–99)
Potassium: 3.6 mEq/L (ref 3.5–5.1)
Sodium: 134 mEq/L — ABNORMAL LOW (ref 135–145)

## 2011-10-24 LAB — GLUCOSE, CAPILLARY
Glucose-Capillary: 114 mg/dL — ABNORMAL HIGH (ref 70–99)
Glucose-Capillary: 131 mg/dL — ABNORMAL HIGH (ref 70–99)
Glucose-Capillary: 141 mg/dL — ABNORMAL HIGH (ref 70–99)
Glucose-Capillary: 142 mg/dL — ABNORMAL HIGH (ref 70–99)
Glucose-Capillary: 156 mg/dL — ABNORMAL HIGH (ref 70–99)

## 2011-10-24 LAB — HEPARIN LEVEL (UNFRACTIONATED): Heparin Unfractionated: 0.67 IU/mL (ref 0.30–0.70)

## 2011-10-24 LAB — CBC
HCT: 28.7 % — ABNORMAL LOW (ref 36.0–46.0)
Hemoglobin: 9.4 g/dL — ABNORMAL LOW (ref 12.0–15.0)
MCHC: 32.8 g/dL (ref 30.0–36.0)
RDW: 17.4 % — ABNORMAL HIGH (ref 11.5–15.5)
WBC: 5.7 10*3/uL (ref 4.0–10.5)

## 2011-10-24 MED ORDER — AMLODIPINE BESYLATE 10 MG PO TABS
10.0000 mg | ORAL_TABLET | Freq: Every day | ORAL | Status: DC
  Administered 2011-10-24: 10 mg
  Filled 2011-10-24: qty 1

## 2011-10-24 MED ORDER — ZOLPIDEM TARTRATE 5 MG PO TABS
5.0000 mg | ORAL_TABLET | Freq: Every evening | ORAL | Status: DC | PRN
  Administered 2011-10-26: 5 mg via ORAL
  Filled 2011-10-24: qty 1

## 2011-10-24 MED ORDER — AMLODIPINE BESYLATE 10 MG PO TABS
10.0000 mg | ORAL_TABLET | Freq: Every day | ORAL | Status: DC
  Administered 2011-10-25 – 2011-10-27 (×3): 10 mg
  Filled 2011-10-24 (×4): qty 1

## 2011-10-24 MED ORDER — NIFEDIPINE ER 30 MG PO TB24
30.0000 mg | ORAL_TABLET | Freq: Every day | ORAL | Status: DC
  Filled 2011-10-24 (×2): qty 1

## 2011-10-24 MED ORDER — FAT EMULSION 20 % IV EMUL
250.0000 mL | INTRAVENOUS | Status: AC
  Administered 2011-10-24: 250 mL via INTRAVENOUS
  Filled 2011-10-24: qty 250

## 2011-10-24 MED ORDER — HYDRALAZINE HCL 20 MG/ML IJ SOLN
10.0000 mg | Freq: Four times a day (QID) | INTRAMUSCULAR | Status: DC | PRN
  Administered 2011-10-24: 10 mg via INTRAVENOUS
  Filled 2011-10-24: qty 0.5

## 2011-10-24 MED ORDER — INSULIN REGULAR HUMAN 100 UNIT/ML IJ SOLN
INTRAVENOUS | Status: AC
  Administered 2011-10-24: 18:00:00 via INTRAVENOUS
  Filled 2011-10-24: qty 2000

## 2011-10-24 NOTE — Progress Notes (Signed)
Patient ID: [name]  female  NWG:[id number]    DOB: 18-Aug-1931    DOA: 10/12/2011  PCP: [name], MD, MD  Interim history she has history of perforated duodenal ulcer, status post antrectomy, duodenal resection, Billroth 2 reconstruction, omental patching of Duodenal bulb, open cholecystectomy, gastrostomy and jejunostomy on 05/07/11. Her course was complicated by epigastric abscess 05/31/11. 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 Brought to the ER on 4/15 w/ fever and purulent discharge from PICC insertion site, abd pain and UA concerning for UTI. She was started on broad spectrum abx, ID has been consulted. Blood cultures grew coagulase negative staph and Streptococcus alactolyticus, she also had e coli growing from urine. Repeat blood cultures on 10/17/2011 has been negative so far. At this time the right picc has been removed and she is getting TNA wire peripheral IV. She also has central line in place. Patient has been getting IV fentanyl when necessary for pain.  10/20/2011: General surgery consulted, PEG tube clamped by surgery service 10/21/2011: Patient underwent TEE, did not show any endocarditis. Speech therapy was consulted for swallowing evaluation, however patient immediately aspirated on every consistency.  10/22/2011: I have called for palliative medicine consult for goals of care, patient will remain n.p.o., Continue TNA. I have called for medical records from patient's gastroenterologist, Dr. Raj Janus in [place].  Subjective: Alert and awake, no complaints or issues overnight   Objective: Weight change:   Intake/Output Summary (Last 24 hours) at 10/24/11 0835 Last data filed at 10/24/11 0016  Gross per 24 hour  Intake    350 ml  Output   2450 ml  Net  -2100 ml   Blood pressure 193/123, pulse 98, temperature 98.7 F (37.1 C), temperature source Oral, resp. rate 18, height 5\' 5"  (1.651 m), weight  80.5 kg (177 lb 7.5 oz), SpO2 96.00%.  Physical Exam: General: Alert and awake, not in any acute distress. HEENT: anicteric sclera, pupils reactive to light and accommodation, EOMI CVS: S1-S2 clear, no murmur rubs or gallops Chest: clear to auscultation bilaterally, no wheezing, rales or rhonchi Abdomen: soft nontender, PEG tube clamped Extremities: no cyanosis, clubbing or edema noted bilaterally   Lab Results: Basic Metabolic Panel:  Lab 10/24/11 5784 10/23/11 0600 10/22/11 0645  NA 134* 133* --  K 3.6 3.6 --  CL 101 99 --  CO2 26 24 --  GLUCOSE 136* 133* --  BUN 13 12 --  CREATININE 0.58 0.55 --  CALCIUM 9.3 9.2 --  MG -- 1.5 --  PHOS -- -- 4.6   Liver Function Tests:  Lab 10/22/11 0645 10/19/11 1125  AST 10 10  ALT 9 10  ALKPHOS 95 89  BILITOT 0.1* 0.2*  PROT 6.3 6.0  ALBUMIN 2.1* 1.9*     Lab 10/24/11 0515 10/23/11 0600 10/19/11 1125  WBC 5.7 5.9 --  NEUTROABS -- -- 3.3  HGB 9.4* 9.2* --  HCT 28.7* 28.7* --  MCV 82.0 81.3 --  PLT 370 337 --  CBG:  Lab 10/24/11 0409 10/24/11 0005 10/23/11 2012 10/23/11 1633 10/23/11 1204  GLUCAP 131* 142* 167* 164* 135*     Micro Results: Recent Results (from the past 240 hour(s))  CULTURE, BLOOD (ROUTINE X 2)     Status: Normal   Collection Time   10/17/11  9:15 AM      Component Value Range Status Comment   Specimen Description BLOOD RIGHT HAND   Final  Special Requests BOTTLES DRAWN AEROBIC ONLY 10CC   Final    Culture  Setup Time [id number]   Final    Culture NO GROWTH 5 DAYS   Final    Report Status 10/23/2011 FINAL   Final   CULTURE, BLOOD (ROUTINE X 2)     Status: Normal   Collection Time   10/17/11  9:25 AM      Component Value Range Status Comment   Specimen Description BLOOD LEFT HAND   Final    Special Requests BOTTLES DRAWN AEROBIC ONLY [facility]   Final    Culture  Setup Time [id number]   Final    Culture NO GROWTH 5 DAYS   Final    Report Status 10/23/2011 FINAL   Final     Studies/Results: Ct  Humerus Right W Contrast  10/16/2011  *RADIOLOGY REPORT*  Clinical Data: Upper extremity venous thrombosis.  Redness about the elbow.  Pain.  CT OF THE RIGHT HUMERUS WITH CONTRAST  Technique:  Multidetector CT imaging was performed following the standard protocol during bolus administration of intravenous contrast.  Contrast: 80mL OMNIPAQUE IOHEXOL 300 MG/ML  SOLN  Comparison: Plain films of the elbow 12/09/2007. Report of upper extremity ultrasound 10/14/2011 reviewed.  Findings: As described report of comparison ultrasound, clot is seen in the left subclavian, axillary and brachial veins. Subclavian vein is not well demonstrated on this examination as it is not scanned in its entirety.  Clot is well demonstrated extending to a point 18 cm below the top of the humeral head. Extensive subcutaneous edema is present.  There is no focal fluid collection to suggest abscess.  No focal bony abnormality is identified.  There is some dependent atelectasis in the right lung. Imaged intra-abdominal contents are unremarkable.  IMPRESSION: Clot in the left subclavian, axillary and brachial veins as described above with associated subcutaneous edema.  Negative for abscess.  Original Report Authenticated By: Bernadene Bell. Maricela Curet, M.D.   Ct Abdomen Pelvis W Contrast  10/13/2011  *RADIOLOGY REPORT*  Clinical Data: 78 year old with sepsis.  CT ABDOMEN AND PELVIS WITH CONTRAST  Technique:  Multidetector CT imaging of the abdomen and pelvis was performed following the standard protocol during bolus administration of intravenous contrast.  Contrast: 80mL OMNIPAQUE IOHEXOL 300 MG/ML  SOLN  Comparison: 07/29/2011  Findings: There are tiny bilateral pleural effusions with basilar atelectasis.  A small amount of focal atelectasis or round atelectasis at the right lung base on sequence 3, image 7.  No evidence for free air.  Coronary artery calcifications.  The gallbladder has been removed. Again noted is low density fluid or  postsurgical changes between the stomach and left hepatic lobe, best seen on sequence 2, image 23.  This area roughly measures 2.1 cm and similar to the prior examination.  There is residual fluid or edema in the gallbladder fossa and along the inferior liver margin.  No evidence for a drainable fluid collection.  Gastrostomy tube is present.  Again noted is a large calcification near the pancreatic head and neck. There is oral contrast within the stomach and large bowel.  No gross abnormality to the liver, portal venous system, adrenal glands, kidneys or spleen.  Again noted are low density structures within the kidneys which are too small to definitively characterize but could represent cysts.  Calcifications associated with the uterus and cannot exclude fibroids.  No significant free fluid in the pelvis.  There is a right hip replacement.  No significant lymphadenopathy. A Foley catheter within the urinary  bladder.  IMPRESSION:  Postsurgical changes in the upper abdomen.  Residual low density fluid or edema around the liver has minimally changed since 07/29/2011.  Tiny pleural effusions with bibasilar atelectasis.  Original Report Authenticated By: Richarda Overlie, M.D.   Ir Fluoro Guide Cv Line Right  09/29/2011  *RADIOLOGY REPORT*  Clinical Data: Diabetes  PICC LINE PLACEMENT WITH ULTRASOUND AND FLUOROSCOPIC  GUIDANCE  Fluoroscopy Time: 0.5 minutes.  The right arm was prepped with chlorhexidine, draped in the usual sterile fashion using maximum barrier technique (cap and mask, sterile gown, sterile gloves, large sterile sheet, hand hygiene and cutaneous antisepsis) and infiltrated locally with 1% Lidocaine.  Ultrasound demonstrated patency of the right basilic vein, and this was documented with an image.  Under real-time ultrasound guidance, this vein was accessed with a 21 gauge micropuncture needle and image documentation was performed.  The needle was exchanged over a guidewire for a peel-away sheath through  which a five [place] double lumen PICC trimmed to 40 cm was advanced, positioned with its tip at the lower SVC/right atrial junction.  Fluoroscopy during the procedure and fluoro spot radiograph confirms appropriate catheter position.  The catheter was flushed, secured to the skin with Prolene sutures, and covered with a sterile dressing.  Complications:  None  IMPRESSION: Successful right arm PICC line placement with ultrasound and fluoroscopic guidance.  The catheter is ready for use.  Original Report Authenticated By: Donavan Burnet, M.D.   Ir US Guide Vasc Access Right  09/29/2011  *RADIOLOGY REPORT*  Clinical Data: Diabetes  PICC LINE PLACEMENT WITH ULTRASOUND AND FLUOROSCOPIC  GUIDANCE  Fluoroscopy Time: 0.5 minutes.  The right arm was prepped with chlorhexidine, draped in the usual sterile fashion using maximum barrier technique (cap and mask, sterile gown, sterile gloves, large sterile sheet, hand hygiene and cutaneous antisepsis) and infiltrated locally with 1% Lidocaine.  Ultrasound demonstrated patency of the right basilic vein, and this was documented with an image.  Under real-time ultrasound guidance, this vein was accessed with a 21 gauge micropuncture needle and image documentation was performed.  The needle was exchanged over a guidewire for a peel-away sheath through which a five [place] double lumen PICC trimmed to 40 cm was advanced, positioned with its tip at the lower SVC/right atrial junction.  Fluoroscopy during the procedure and fluoro spot radiograph confirms appropriate catheter position.  The catheter was flushed, secured to the skin with Prolene sutures, and covered with a sterile dressing.  Complications:  None  IMPRESSION: Successful right arm PICC line placement with ultrasound and fluoroscopic guidance.  The catheter is ready for use.  Original Report Authenticated By: Donavan Burnet, M.D.   Dg Chest Port 1 View  10/13/2011  *RADIOLOGY REPORT*  Clinical Data: Line placement.   PORTABLE CHEST - 1 VIEW  Comparison: 10/12/2011  Findings: Cardiomegaly with vascular congestion.  Diffuse interstitial prominence may reflect early interstitial edema.  No confluent opacities or effusions.  No acute bony abnormality.  Left central line is in place.  The tip is in the upper right atrium.  No pneumothorax.  IMPRESSION: Cardiomegaly, vascular congestion.  Question early interstitial edema.  Original Report Authenticated By: Cyndie Chime, M.D.   Dg Chest Port 1 View  10/12/2011  *RADIOLOGY REPORT*  Clinical Data: Fever.  Drainage about a PICC.  PORTABLE CHEST - 1 VIEW  Comparison: Fluoroscopic spot view from PICC placement 09/29/2011. Plain films of the chest 07/28/2011.  CT chest 01/26/2011.  Findings: The patient's right PICC has backed  out with the tip in the right axilla.  Lungs are clear.  Heart size is normal.  No pneumothorax or pleural effusion.  IMPRESSION:  1.  Tip of the patient's right PICC is now in the right axilla. 2.  No acute cardiopulmonary disease.  Original Report Authenticated By: Bernadene Bell. Maricela Curet, M.D.   Dg Abd Portable 2v  10/13/2011  *RADIOLOGY REPORT*  Clinical Data: Abdominal pain.  PORTABLE ABDOMEN - 2 VIEW  Comparison: CT 10/13/2011  Findings: Oral contrast material seen within the colon.  There is a nonobstructive bowel gas pattern.  No free air on the decubitus views.  No organomegaly.  IMPRESSION: No obstruction or free air.  Original Report Authenticated By: Cyndie Chime, M.D.    Medications: Scheduled Meds:    . amLODipine  10 mg Per Tube Daily  . cefTRIAXone (ROCEPHIN)  IV  2 g Intravenous Q24H  . cloNIDine  0.2 mg Transdermal Weekly  . fentaNYL  25 mcg Transdermal Q72H  . insulin aspart  0-9 Units Subcutaneous Q4H  . magnesium sulfate 1 - 4 g bolus IVPB  4 g Intravenous Once  . metoprolol  5 mg Intravenous Once  . metoprolol tartrate  50 mg Per Tube BID  . metroNIDAZOLE  500 mg Oral Q8H  . pantoprazole (PROTONIX) IV  40 mg Intravenous Q24H   . sodium chloride  10-40 mL Intracatheter Q12H  . vancomycin  1,250 mg Intravenous Q12H  . DISCONTD: amLODipine  5 mg Per Tube Daily  . DISCONTD: metoprolol tartrate  25 mg Per Tube BID   Continuous Infusions:    . sodium chloride 500 mL (10/20/11 1127)  . TPN (CLINIMIX) +/- additives 80 mL/hr at 10/22/11 1732   And  . fat emulsion 250 mL (10/22/11 1732)  . TPN (CLINIMIX) +/- additives 80 mL/hr at 10/23/11 1716   And  . fat emulsion 250 mL (10/23/11 1716)  . TPN (CLINIMIX) +/- additives     And  . fat emulsion    . heparin 1,700 Units/hr (10/24/11 0636)     Assessment/Plan:  Sepsis, Coag negative staph and strept Bacteremia,( PCN sensitive Strep BOVIS species from 2/2 admission cultures, and a Clostridium perfringens species from 1/2 cultures per ID, Dr Daiva Eves):  - Continue vancomycin and Rocephin per ID  - ID is following, reviewed Dr. Lattie Haw recommendations, medical records from Dr. Raj Janus' office pending  - TEE negative for any endocarditis  Hypertension: Still uncontrolled -Continue clonidine patch, increased Norvasc, continue beta blocker via Gtube  DIABETES MELLITUS, TYPE EA:VWUJ SSI , stable  Hyponatremia: Stable for now, Sec to GI losses.   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 - palliative medicine consulted for goals of care given the complexity of the medical condition and future guidelines, meeting today.   DVT of Right upper extremity: Cont IV Heparin   Escherichia coli UTI (lower urinary tract infection): Most likely colonization as per ID   H/O C diff:Treated in February:No diarrhea at this time   Chronic pain: On Fentanyl patch 25 mcg/hr Q 72 hr.   Dementia; pleasantly confused    DVT Prophylaxis: On heparin drip  Code Status: Full  code  Disposition: palliative medicine goals of care meeting today. ?possibility of LTAC for disposition as patient appears to be stable but needs continuous medical management, will  check with social Investment banker, operational on Monday.   LOS: 12 days   Maimouna Rondeau M.D. [facility] 10/24/2011, 8:35 AM Pager: [phone]

## 2011-10-24 NOTE — Progress Notes (Signed)
#  16 Foley catheter placed.

## 2011-10-24 NOTE — Progress Notes (Signed)
PARENTERAL NUTRITION CONSULT NOTE - FOLLOW UP   Pharmacy Consult for TPN Indication: chronic TPN at home for short - gut syndrome  Allergies  Allergen Reactions  . Other Swelling    Pinto beans cause swelling and hives  . Penicillins Swelling    Patient Measurements: Height: 5\' 5"  (165.1 cm) Weight: 177 lb 7.5 oz (80.5 kg) IBW/kg (Calculated) : 57  Usual Weight: 162 lbs  Vital Signs: Temp: 98.7 F (37.1 C) (04/27 0500) Temp src: Oral (04/27 0500) BP: 193/123 mmHg (04/27 0500) Pulse Rate: 98  (04/27 0500) Intake/Output from previous day: 04/26 0701 - 04/27 0700 In: 350 [I.V.:350] Out: 2450 [Urine:2450]  Labs:  [facility] 10/24/11 0515 10/23/11 0600 10/22/11 0645  WBC 5.7 5.9 5.7  HGB 9.4* 9.2* 9.2*  HCT 28.7* 28.7* 28.1*  PLT 370 337 309  APTT -- -- --  INR -- -- --     Basename 10/24/11 0515 10/23/11 0600 10/22/11 0645  NA 134* 133* 133*  K 3.6 3.6 3.3*  CL 101 99 100  CO2 26 24 24   GLUCOSE 136* 133* 147*  BUN 13 12 12   CREATININE 0.58 0.55 0.49*  LABCREA -- -- --  CREAT24HRUR -- -- --  CALCIUM 9.3 9.2 8.9  MG -- 1.5 1.5  PHOS -- -- 4.6  PROT -- -- 6.3  ALBUMIN -- -- 2.1*  AST -- -- 10  ALT -- -- 9  ALKPHOS -- -- 95  BILITOT -- -- 0.1*  BILIDIR -- -- --  IBILI -- -- --  PREALBUMIN -- -- --  TRIG -- -- --  CHOLHDL -- -- --  CHOL -- -- --   Estimated Creatinine Clearance: 60.8 ml/min (by C-G formula based on Cr of 0.58).    Basename 10/24/11 0409 10/24/11 0005 10/23/11 2012  GLUCAP 131* 142* 167*     Insulin Requirements in the past 24 hours:  6 units Novolog SSI and 50 unit regular insulin in TPN  Current Nutrition: Ice chips   Nutritional Goals:  1650-1850 kCal, 85-100 grams of protein per day   Assessment:  32 YOF with h/o short-gut syndrome admitted from [facility] where she is on chronic TPN and takes POs only for pleasure.   PICC line placed in left arm.  Admit: abd pain and fever/purulent discharge from PICC line GI:  erosive esophagitis with epigastric abscess and perf pyloric ulcer s/p ex-lap 05/06/11, s/p gastrostomy, gastrectomy, cholecystectomy, 11/26 jejunal-ileal fistula, gastroileostomy resulting in short gut, PEG to suction, on ice chips.  Severe risk for aspiration and Speech recommends continuing on TNA. Endo: hx DM, CBGs 131-167, adequately controlled on SSI and insulin in TPN (was on metformin + 65 units insulin daily PTA) Lytes:  persistent mild hyponatremia--unable to correct with pre-made Clinimix, K 3.6, 4/26 Phos x corr Ca = 49 Renal: SCr stable, NS at [facility], UOP 1.3 ml/kg/hr Pulm: stable on RA Cards: hx HTN - BP elevated, HR 90-100's, on scheduled lopressor and amlodipine. Heparin gtt for new DVT Hepatobil: LFTs/tbili WNL, hgb/plts stable Neuro: hx depression (seroquel / trazodone PTA), on fentanyl patch ID:  Vanc D#10, CTX MD#5, Flagyl MD#4(s/p Fortaz, Flagyl, gent, Cefepime, Azactam) for septic thrombophlebitis, CoNS + Strep bovis bacteremia, and E.coli UTI.  Tmax 99.6, WBC WNL, TEE negative for vegetations, ID recommends at least 4 weeks of antibiotics. Best Practices: PPI IV, heparin gtt   Plan:  - Cycle Clinimix E 5/15  Over 18 hours to meet 100% of patient's needs.  Will decrease infusion time to  12 hours if tolerated. - MVI and TE MWF only due to national shortage  - - Monitor I/O's, lytes, cbgs.    Talbert Cage, PharmD Pager:  [phone] 10/24/2011, 6:59 AM

## 2011-10-24 NOTE — Consult Note (Signed)
Consult Note from the Palliative Medicine Team at [facility] Patient [name]      DOB: [birth year]      AVW:[id number]   Consult Requested by: Dr. Isidoro Donning     PCP: [name] Overman, MD, MD Reason for Consultation: Goals of care    Phone Number:[phone]  Assessment and Plan:  Discussed in detail with patient and her daughter and grandson, at this time patient wants to continue current medical treatment but she has stated that she does not want future surgery, she would like to get the nutrition via TNA but if she ends up in hospital again in a similar situation with sepsis and altered mental status, at that time she would like to be kept comfortable. The daughter should be called at that time who is supportive of mother's decision.  1. Code Status: DO NOT INTUBATE, but wants compressions, defibrillation, antiarrhythmics 2. Symptom Control: Pain well controlled with medications morphine 2 mg IV every 4 hours when necessary and fentanyl patch 25 mcg q. 72 hours, insomnia-will add when necessary ambien. 3. Psycho/Social: Daughter and grandson are supportive 4. Disposition: Skilled nursing facility  Patient Documents Completed or Given: Document Given Completed  Advanced Directives Pkt    MOST    DNR    Gone from My Sight    Hard Choices      Brief HPI: she has history of perforated duodenal ulcer, status post antrectomy, duodenal resection, Billroth 2 reconstruction, omental patching of Duodenal bulb, open cholecystectomy, gastrostomy and jejunostomy on 05/07/11. Her course was complicated by epigastric abscess 05/31/11. 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 Brought to the ER on 4/15 w/ fever and purulent discharge from PICC insertion site, abd pain and UA concerning for UTI. She was started on broad spectrum abx, ID was  consulted. Blood cultures grew coagulase negative staph and Streptococcus alactolyticus, she also had e coli  growing from urine.  Patient's right PICC line was removed, and left PICC line was inserted as patient was getting TNA. General surgery was consulted and PEG tube was clamped by surgery, goals of care meeting was done with the patient's family, and patient. Patient's daughter and grandson were present at the meeting. Discussed about patient's complicated medical course, her limitation with long-term nutrition because of Gastrolieal  Fistula, currently she is on TNA. Discussed about the CODE STATUS, further medical interventions including surgery for gastro-ileal fistula.  ROS: Anxiety-negative Dyspnea-negative Depression-negative Insomnia-positive Nausea-negative Vomiting-negative Pain-controlled with medications  PMH:  Past Medical History  Diagnosis Date  . Hypertension   . Reflux   . Right elbow pain     OTIF  . Dementia   . Depression   . Osteoarthritis   . Pancreatitis 11/2007    HOP  . Angiomyolipoma of kidney     right  . Ulcerative esophagitis 12/05/2007    hx elevated gastrin, severe on EGD by Dr Jena Gauss , h pylori negative  . Hiatal hernia   . S/P colonoscopy 2009    pt reports normal by Dr Lovell Sheehan  . Diabetes mellitus   . Anemia   . Hyponatremia   . Cellulitis   . Esophagitis   . Peptic ulcer   . Insomnia      PSH: Past Surgical History  Procedure Date  . Orif right hip 1999    [facility]  . Umbilical hernia repair 74 months old    [place]  . Esophagogastroduodenoscopy 01/28/2011    Procedure: ESOPHAGOGASTRODUODENOSCOPY (  EGD);  Surgeon: Arlyce Harman, MD;  Location: [facility];  Service: Endoscopy;  Laterality: N/A;  . Colonoscopy 01/28/2011    Procedure: COLONOSCOPY;  Surgeon: Arlyce Harman, MD;  Location: [facility];  Service: Endoscopy;  Laterality: N/A;  . Laparotomy 02/04/2011    Procedure: EXPLORATORY LAPAROTOMY;  Surgeon: Fabio Bering;  Location: [facility] ORS;  Service: General;  Laterality: N/A;  . Laparotomy 05/07/2011    Procedure: EXPLORATORY  LAPAROTOMY;  Surgeon: Ardeth Sportsman, MD;  Location: [facility] OR;  Service: General;  Laterality: N/A;  lysis of adhesions  . Gastrostomy 05/07/2011    Procedure: GASTROSTOMY;  Surgeon: Ardeth Sportsman, MD;  Location: [facility] OR;  Service: General;  Laterality: N/A;  g-tube / j -tube placement  . Gastrectomy 05/07/2011    Procedure: GASTRECTOMY;  Surgeon: Ardeth Sportsman, MD;  Location[facility] OR;  Service: General;  Laterality: N/A;  Partial gastrectomy  . Cholecystectomy 05/07/2011    Procedure: CHOLECYSTECTOMY;  Surgeon: Ardeth Sportsman, MD;  Location: [facility] OR;  Service: General;  Laterality: N/A;  open  . Tee without cardioversion 10/21/2011    Procedure: TRANSESOPHAGEAL ECHOCARDIOGRAM (TEE);  Surgeon: Wendall Stade, MD;  Location: [facility] ENDOSCOPY;  Service: Cardiovascular;  Laterality: N/A;   I have reviewed the FH and SH and  If appropriate update it with new information. Allergies  Allergen Reactions  . Other Swelling    Pinto beans cause swelling and hives  . Penicillins Swelling   Scheduled Meds:   . amLODipine  10 mg Per Tube Daily  . cefTRIAXone (ROCEPHIN)  IV  2 g Intravenous Q24H  . cloNIDine  0.2 mg Transdermal Weekly  . fentaNYL  25 mcg Transdermal Q72H  . insulin aspart  0-9 Units Subcutaneous Q4H  . magnesium sulfate 1 - 4 g bolus IVPB  4 g Intravenous Once  . metoprolol  5 mg Intravenous Once  . metoprolol tartrate  50 mg Per Tube BID  . metroNIDAZOLE  500 mg Oral Q8H  . pantoprazole (PROTONIX) IV  40 mg Intravenous Q24H  . sodium chloride  10-40 mL Intracatheter Q12H  . vancomycin  1,250 mg Intravenous Q12H  . DISCONTD: amLODipine  5 mg Per Tube Daily   Continuous Infusions:   . sodium chloride 500 mL (10/20/11 1127)  . TPN (CLINIMIX) +/- additives 80 mL/hr at 10/22/11 1732   And  . fat emulsion 250 mL (10/22/11 1732)  . TPN (CLINIMIX) +/- additives 80 mL/hr at 10/23/11 1716   And  . fat emulsion 250 mL (10/23/11 1716)  . TPN (CLINIMIX) +/- additives     And  . fat emulsion     . heparin 1,700 Units/hr (10/24/11 0636)   PRN Meds:.acetaminophen, acetaminophen, hydrALAZINE, morphine injection, ondansetron (ZOFRAN) IV, ondansetron (ZOFRAN) IV, ondansetron, oxyCODONE, sodium chloride    BP 193/123  Pulse 98  Temp(Src) 98.7 F (37.1 C) (Oral)  Resp 18  Ht 5\' 5"  (1.651 m)  Wt 80.5 kg (177 lb 7.5 oz)  BMI 29.53 kg/m2  SpO2 96%      Intake/Output Summary (Last 24 hours) at 10/24/11 1334 Last data filed at 10/24/11 1211  Gross per 24 hour  Intake    350 ml  Output   2702 ml  Net  -2352 ml    Physical Exam:  General: 78 year-old female in no apparent distress HEENT:  Oral mucosa is pink and moist Chest:   Clear to auscultation bilaterally CVS: S1-S2 is regular in rate and rhythm  Abdomen: Soft nontender, PEG tube in place Ext: No cyanosis no clubbing no edema Neuro: Alert oriented x3, has decision-making capacity  Labs: CBC    Component Value Date/Time   WBC 5.7 10/24/2011 0515   RBC 3.50* 10/24/2011 0515   HGB 9.4* 10/24/2011 0515   HCT 28.7* 10/24/2011 0515   PLT 370 10/24/2011 0515   MCV 82.0 10/24/2011 0515   MCH 26.9 10/24/2011 0515   MCHC 32.8 10/24/2011 0515   RDW 17.4* 10/24/2011 0515   LYMPHSABS 1.7 10/19/2011 1125   MONOABS 0.6 10/19/2011 1125   EOSABS 0.2 10/19/2011 1125   BASOSABS 0.0 10/19/2011 1125    BMET    Component Value Date/Time   NA 134* 10/24/2011 0515   K 3.6 10/24/2011 0515   CL 101 10/24/2011 0515   CO2 26 10/24/2011 0515   GLUCOSE 136* 10/24/2011 0515   BUN 13 10/24/2011 0515   CREATININE 0.58 10/24/2011 0515   CALCIUM 9.3 10/24/2011 0515   GFRNONAA 86* 10/24/2011 0515   GFRAA >90 10/24/2011 0515    CMP     Component Value Date/Time   NA 134* 10/24/2011 0515   K 3.6 10/24/2011 0515   CL 101 10/24/2011 0515   CO2 26 10/24/2011 0515   GLUCOSE 136* 10/24/2011 0515   BUN 13 10/24/2011 0515   CREATININE 0.58 10/24/2011 0515   CALCIUM 9.3 10/24/2011 0515   PROT 6.3 10/22/2011 0645   ALBUMIN 2.1* 10/22/2011 0645   AST 10  10/22/2011 0645   ALT 9 10/22/2011 0645   ALKPHOS 95 10/22/2011 0645   BILITOT 0.1* 10/22/2011 0645   GFRNONAA 86* 10/24/2011 0515   GFRAA >90 10/24/2011 0515        Time In Time Out Total Time Spent with Patient Total Overall Time         Greater than 50%  of this time was spent counseling and coordinating care related to the above assessment and plan.

## 2011-10-25 LAB — GLUCOSE, CAPILLARY
Glucose-Capillary: 160 mg/dL — ABNORMAL HIGH (ref 70–99)
Glucose-Capillary: 109 mg/dL — ABNORMAL HIGH (ref 70–99)

## 2011-10-25 LAB — CBC
MCH: 26.2 pg (ref 26.0–34.0)
MCHC: 32.8 g/dL (ref 30.0–36.0)
Platelets: 346 10*3/uL (ref 150–400)

## 2011-10-25 LAB — HEPARIN LEVEL (UNFRACTIONATED): Heparin Unfractionated: 0.88 IU/mL — ABNORMAL HIGH (ref 0.30–0.70)

## 2011-10-25 MED ORDER — INSULIN REGULAR HUMAN 100 UNIT/ML IJ SOLN
INTRAVENOUS | Status: AC
  Administered 2011-10-25: 18:00:00 via INTRAVENOUS
  Filled 2011-10-25: qty 2000

## 2011-10-25 MED ORDER — FAT EMULSION 20 % IV EMUL
250.0000 mL | INTRAVENOUS | Status: AC
  Administered 2011-10-25: 250 mL via INTRAVENOUS
  Filled 2011-10-25: qty 250

## 2011-10-25 NOTE — Progress Notes (Signed)
Patient ID: [name]  female  QIO:[id number]    DOB: March 23, 1932    DOA: 10/12/2011  PCP: [name], MD, MD  Interim history she has history of perforated duodenal ulcer, status post antrectomy, duodenal resection, Billroth 2 reconstruction, omental patching of Duodenal bulb, open cholecystectomy, gastrostomy and jejunostomy on 05/07/11. Her course was complicated by epigastric abscess 05/31/11. 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 Brought to the ER on 4/15 w/ fever and purulent discharge from PICC insertion site, abd pain and UA concerning for UTI. She was started on broad spectrum abx, ID has been consulted. Blood cultures grew coagulase negative staph and Streptococcus alactolyticus, she also had e coli growing from urine. Repeat blood cultures on 10/17/2011 has been negative so far. At this time the right picc has been removed and she is getting TNA wire peripheral IV. She also has central line in place. Patient has been getting IV fentanyl when necessary for pain.  10/20/2011: General surgery consulted, PEG tube clamped by surgery service 10/21/2011: Patient underwent TEE, did not show any endocarditis. Speech therapy was consulted for swallowing evaluation, however patient immediately aspirated on every consistency.  10/22/2011: I have called for palliative medicine consult for goals of care, patient will remain n.p.o., Continue TNA. I have called for medical records from patient's gastroenterologist, Dr. Raj Janus in [employer]. 10/24/2011: Palliative medicine goals of care done, DNI but continue current medical management  Subjective: Alert and awake, complaining of headache. BP much better controlled now  Objective: Weight change:   Intake/Output Summary (Last 24 hours) at 10/25/11 0855 Last data filed at 10/25/11 0500  Gross per 24 hour  Intake      0 ml  Output   1802 ml  Net  -1802 ml   Blood pressure 124/78,  pulse 92, temperature 98 F (36.7 C), temperature source Oral, resp. rate 18, height 5\' 5"  (1.651 m), weight 81.7 kg (180 lb 1.9 oz), SpO2 96.00%.  Physical Exam: General: Alert and awake, not in any acute distress. HEENT: anicteric sclera, pupils reactive to light and accommodation, EOMI CVS: S1-S2 clear, no murmur rubs or gallops Chest: clear to auscultation bilaterally, no wheezing, rales or rhonchi Abdomen: soft nontender, PEG tube clamped Extremities: no cyanosis, clubbing or edema noted bilaterally   Lab Results: Basic Metabolic Panel:  Lab 10/24/11 3244 10/23/11 0600 10/22/11 0645  NA 134* 133* --  K 3.6 3.6 --  CL 101 99 --  CO2 26 24 --  GLUCOSE 136* 133* --  BUN 13 12 --  CREATININE 0.58 0.55 --  CALCIUM 9.3 9.2 --  MG -- 1.5 --  PHOS -- -- 4.6   Liver Function Tests:  Lab 10/22/11 0645 10/19/11 1125  AST 10 10  ALT 9 10  ALKPHOS 95 89  BILITOT 0.1* 0.2*  PROT 6.3 6.0  ALBUMIN 2.1* 1.9*     Lab 10/24/11 0515 10/23/11 0600 10/19/11 1125  WBC 5.7 5.9 --  NEUTROABS -- -- 3.3  HGB 9.4* 9.2* --  HCT 28.7* 28.7* --  MCV 82.0 81.3 --  PLT 370 337 --  CBG:  Lab 10/25/11 0408 10/24/11 2344 10/24/11 2017 10/24/11 1608 10/24/11 1159  GLUCAP 144* 173* 114* 141* 156*     Micro Results: Recent Results (from the past 240 hour(s))  CULTURE, BLOOD (ROUTINE X 2)     Status: Normal   Collection Time   10/17/11  9:15 AM      Component  Value Range Status Comment   Specimen Description BLOOD RIGHT HAND   Final    Special Requests BOTTLES DRAWN AEROBIC ONLY 10CC   Final    Culture  Setup Time [id number]   Final    Culture NO GROWTH 5 DAYS   Final    Report Status 10/23/2011 FINAL   Final   CULTURE, BLOOD (ROUTINE X 2)     Status: Normal   Collection Time   10/17/11  9:25 AM      Component Value Range Status Comment   Specimen Description BLOOD LEFT HAND   Final    Special Requests BOTTLES DRAWN AEROBIC ONLY [facility]   Final    Culture  Setup Time [id number]    Final    Culture NO GROWTH 5 DAYS   Final    Report Status 10/23/2011 FINAL   Final     Studies/Results: Ct Humerus Right W Contrast  10/16/2011  *RADIOLOGY REPORT*  Clinical Data: Upper extremity venous thrombosis.  Redness about the elbow.  Pain.  CT OF THE RIGHT HUMERUS WITH CONTRAST  Technique:  Multidetector CT imaging was performed following the standard protocol during bolus administration of intravenous contrast.  Contrast: 80mL OMNIPAQUE IOHEXOL 300 MG/ML  SOLN  Comparison: Plain films of the elbow 12/09/2007. Report of upper extremity ultrasound 10/14/2011 reviewed.  Findings: As described report of comparison ultrasound, clot is seen in the left subclavian, axillary and brachial veins. Subclavian vein is not well demonstrated on this examination as it is not scanned in its entirety.  Clot is well demonstrated extending to a point 18 cm below the top of the humeral head. Extensive subcutaneous edema is present.  There is no focal fluid collection to suggest abscess.  No focal bony abnormality is identified.  There is some dependent atelectasis in the right lung. Imaged intra-abdominal contents are unremarkable.  IMPRESSION: Clot in the left subclavian, axillary and brachial veins as described above with associated subcutaneous edema.  Negative for abscess.  Original Report Authenticated By: Bernadene Bell. Maricela Curet, M.D.   Ct Abdomen Pelvis W Contrast  10/13/2011  *RADIOLOGY REPORT*  Clinical Data: 78 year old with sepsis.  CT ABDOMEN AND PELVIS WITH CONTRAST  Technique:  Multidetector CT imaging of the abdomen and pelvis was performed following the standard protocol during bolus administration of intravenous contrast.  Contrast: 80mL OMNIPAQUE IOHEXOL 300 MG/ML  SOLN  Comparison: 07/29/2011  Findings: There are tiny bilateral pleural effusions with basilar atelectasis.  A small amount of focal atelectasis or round atelectasis at the right lung base on sequence 3, image 7.  No evidence for free air.   Coronary artery calcifications.  The gallbladder has been removed. Again noted is low density fluid or postsurgical changes between the stomach and left hepatic lobe, best seen on sequence 2, image 23.  This area roughly measures 2.1 cm and similar to the prior examination.  There is residual fluid or edema in the gallbladder fossa and along the inferior liver margin.  No evidence for a drainable fluid collection.  Gastrostomy tube is present.  Again noted is a large calcification near the pancreatic head and neck. There is oral contrast within the stomach and large bowel.  No gross abnormality to the liver, portal venous system, adrenal glands, kidneys or spleen.  Again noted are low density structures within the kidneys which are too small to definitively characterize but could represent cysts.  Calcifications associated with the uterus and cannot exclude fibroids.  No significant free fluid in the pelvis.  There is a right hip replacement.  No significant lymphadenopathy. A Foley catheter within the urinary bladder.  IMPRESSION:  Postsurgical changes in the upper abdomen.  Residual low density fluid or edema around the liver has minimally changed since 07/29/2011.  Tiny pleural effusions with bibasilar atelectasis.  Original Report Authenticated By: Richarda Overlie, M.D.   Ir Fluoro Guide Cv Line Right  09/29/2011  *RADIOLOGY REPORT*  Clinical Data: Diabetes  PICC LINE PLACEMENT WITH ULTRASOUND AND FLUOROSCOPIC  GUIDANCE  Fluoroscopy Time: 0.5 minutes.  The right arm was prepped with chlorhexidine, draped in the usual sterile fashion using maximum barrier technique (cap and mask, sterile gown, sterile gloves, large sterile sheet, hand hygiene and cutaneous antisepsis) and infiltrated locally with 1% Lidocaine.  Ultrasound demonstrated patency of the right basilic vein, and this was documented with an image.  Under real-time ultrasound guidance, this vein was accessed with a 21 gauge micropuncture needle and image  documentation was performed.  The needle was exchanged over a guidewire for a peel-away sheath through which a five [place] double lumen PICC trimmed to 40 cm was advanced, positioned with its tip at the lower SVC/right atrial junction.  Fluoroscopy during the procedure and fluoro spot radiograph confirms appropriate catheter position.  The catheter was flushed, secured to the skin with Prolene sutures, and covered with a sterile dressing.  Complications:  None  IMPRESSION: Successful right arm PICC line placement with ultrasound and fluoroscopic guidance.  The catheter is ready for use.  Original Report Authenticated By: Donavan Burnet, M.D.   Ir US Guide Vasc Access Right  09/29/2011  *RADIOLOGY REPORT*  Clinical Data: Diabetes  PICC LINE PLACEMENT WITH ULTRASOUND AND FLUOROSCOPIC  GUIDANCE  Fluoroscopy Time: 0.5 minutes.  The right arm was prepped with chlorhexidine, draped in the usual sterile fashion using maximum barrier technique (cap and mask, sterile gown, sterile gloves, large sterile sheet, hand hygiene and cutaneous antisepsis) and infiltrated locally with 1% Lidocaine.  Ultrasound demonstrated patency of the right basilic vein, and this was documented with an image.  Under real-time ultrasound guidance, this vein was accessed with a 21 gauge micropuncture needle and image documentation was performed.  The needle was exchanged over a guidewire for a peel-away sheath through which a five [place] double lumen PICC trimmed to 40 cm was advanced, positioned with its tip at the lower SVC/right atrial junction.  Fluoroscopy during the procedure and fluoro spot radiograph confirms appropriate catheter position.  The catheter was flushed, secured to the skin with Prolene sutures, and covered with a sterile dressing.  Complications:  None  IMPRESSION: Successful right arm PICC line placement with ultrasound and fluoroscopic guidance.  The catheter is ready for use.  Original Report Authenticated By: Donavan Burnet, M.D.   Dg Chest Port 1 View  10/13/2011  *RADIOLOGY REPORT*  Clinical Data: Line placement.  PORTABLE CHEST - 1 VIEW  Comparison: 10/12/2011  Findings: Cardiomegaly with vascular congestion.  Diffuse interstitial prominence may reflect early interstitial edema.  No confluent opacities or effusions.  No acute bony abnormality.  Left central line is in place.  The tip is in the upper right atrium.  No pneumothorax.  IMPRESSION: Cardiomegaly, vascular congestion.  Question early interstitial edema.  Original Report Authenticated By: Cyndie Chime, M.D.   Dg Chest Port 1 View  10/12/2011  *RADIOLOGY REPORT*  Clinical Data: Fever.  Drainage about a PICC.  PORTABLE CHEST - 1 VIEW  Comparison: Fluoroscopic spot view from PICC placement 09/29/2011. Plain films  of the chest 07/28/2011.  CT chest 01/26/2011.  Findings: The patient's right PICC has backed out with the tip in the right axilla.  Lungs are clear.  Heart size is normal.  No pneumothorax or pleural effusion.  IMPRESSION:  1.  Tip of the patient's right PICC is now in the right axilla. 2.  No acute cardiopulmonary disease.  Original Report Authenticated By: Bernadene Bell. Maricela Curet, M.D.   Dg Abd Portable 2v  10/13/2011  *RADIOLOGY REPORT*  Clinical Data: Abdominal pain.  PORTABLE ABDOMEN - 2 VIEW  Comparison: CT 10/13/2011  Findings: Oral contrast material seen within the colon.  There is a nonobstructive bowel gas pattern.  No free air on the decubitus views.  No organomegaly.  IMPRESSION: No obstruction or free air.  Original Report Authenticated By: Cyndie Chime, M.D.    Medications: Scheduled Meds:    . amLODipine  10 mg Per Tube Daily  . cefTRIAXone (ROCEPHIN)  IV  2 g Intravenous Q24H  . cloNIDine  0.2 mg Transdermal Weekly  . fentaNYL  25 mcg Transdermal Q72H  . insulin aspart  0-9 Units Subcutaneous Q4H  . metoprolol  5 mg Intravenous Once  . metoprolol tartrate  50 mg Per Tube BID  . metroNIDAZOLE  500 mg Oral Q8H  .  pantoprazole (PROTONIX) IV  40 mg Intravenous Q24H  . sodium chloride  10-40 mL Intracatheter Q12H  . vancomycin  1,250 mg Intravenous Q12H  . DISCONTD: amLODipine  10 mg Per Tube Daily  . DISCONTD: NIFEdipine  30 mg Oral Daily   Continuous Infusions:    . sodium chloride 500 mL (10/20/11 1127)  . TPN (CLINIMIX) +/- additives 80 mL/hr at 10/23/11 1716   And  . fat emulsion 250 mL (10/23/11 1716)  . TPN (CLINIMIX) +/- additives 80 mL/hr at 10/24/11 1809   And  . fat emulsion 250 mL (10/24/11 1808)  . TPN (CLINIMIX) +/- additives     And  . fat emulsion    . heparin 1,700 Units/hr (10/24/11 2234)     Assessment/Plan:  Sepsis, Coag negative staph and strept Bacteremia,( PCN sensitive Strep BOVIS species from 2/2 admission cultures, and a Clostridium perfringens species from 1/2 cultures per ID, Dr Daiva Eves):  - Continue vancomycin and Rocephin per ID  - TEE negative for any endocarditis, GI records pending  Hypertension: Much better controlled now -Continue clonidine patch, Norvasc,  beta blocker via Gtube  DIABETES MELLITUS, TYPE TJ:QZES SSI , stable  Hyponatremia: Stable for now, Sec to GI losses.   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   Dementia; pleasantly confused    DVT Prophylaxis: On heparin drip  Code Status: Full code  Disposition: palliative medicine goals done, family requested continue medical management.  ?possibility of LTAC for disposition as patient appears to be stable but  needs continuous medical management, will check with social worker/case manager on Monday.   LOS: 13 days   RAI,RIPUDEEP M.D. [facility] 10/25/2011, 8:55 AM Pager: [phone]

## 2011-10-25 NOTE — Progress Notes (Signed)
CSW noted GOC recommendations for SNF.  CSW continue to follow. Milus Banister MSW,LCSW w/e Coverage [phone]

## 2011-10-25 NOTE — Progress Notes (Signed)
PARENTERAL NUTRITION CONSULT NOTE - FOLLOW UP   Pharmacy Consult for TPN Indication: chronic TPN at home for short - gut syndrome  Allergies  Allergen Reactions  . Other Swelling    Pinto beans cause swelling and hives  . Penicillins Swelling    Patient Measurements: Height: 5\' 5"  (165.1 cm) Weight: 180 lb 1.9 oz (81.7 kg) IBW/kg (Calculated) : 57  Usual Weight: 162 lbs  Vital Signs: Temp: 98 F (36.7 C) (04/28 0600) Temp src: Oral (04/28 0600) BP: 124/78 mmHg (04/28 0600) Pulse Rate: 92  (04/28 0600) Intake/Output from previous day: 04/27 0701 - 04/28 0700 In: 0  Out: 1802 [Urine:1800]  Labs:  Basename 10/24/11 0515 10/23/11 0600  WBC 5.7 5.9  HGB 9.4* 9.2*  HCT 28.7* 28.7*  PLT 370 337  APTT -- --  INR -- --     Basename 10/24/11 0515 10/23/11 0600  NA 134* 133*  K 3.6 3.6  CL 101 99  CO2 26 24  GLUCOSE 136* 133*  BUN 13 12  CREATININE 0.58 0.55  LABCREA -- --  CREAT24HRUR -- --  CALCIUM 9.3 9.2  MG -- 1.5  PHOS -- --  PROT -- --  ALBUMIN -- --  AST -- --  ALT -- --  ALKPHOS -- --  BILITOT -- --  BILIDIR -- --  IBILI -- --  PREALBUMIN -- --  TRIG -- --  CHOLHDL -- --  CHOL -- --   Estimated Creatinine Clearance: 61.2 ml/min (by C-G formula based on Cr of 0.58).    Basename 10/25/11 0408 10/24/11 2344 10/24/11 2017  GLUCAP 144* 173* 114*     Insulin Requirements in the past 24 hours:  6 units Novolog SSI and 50 unit regular insulin in TPN  Current Nutrition: Ice chips   Nutritional Goals:  1650-1850 kCal, 85-100 grams of protein per day   Assessment:  55 YOF with h/o short-gut syndrome admitted from [facility] where she is on chronic TPN and takes POs only for pleasure. TPN was not cycled last pm.  PICC line placed in left arm.  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  Severe risk for aspiration and Speech recommends continuing on TNA. Endo: hx DM, CBGs 114-173, adequately controlled on SSI and insulin in TPN (was on metformin + 65 units insulin daily PTA) Lytes:  persistent mild hyponatremia--unable to correct with pre-made Clinimix, K 3.6, 4/26 Phos x corr Ca = 49 Renal: SCr stable, NS at [facility], UOP 0.9 ml/kg/hr Pulm: stable on RA Cards: hx HTN - BP elevated, HR 90-100's, on scheduled lopressor and amlodipine. Heparin gtt for new DVT Hepatobil: LFTs/tbili WNL, hgb/plts stable Neuro: hx depression (seroquel / trazodone PTA), on fentanyl patch ID:  Vanc D#11, CTX MD#6, Flagyl MD#5(s/p Fortaz, Flagyl, gent, Cefepime, Azactam) for septic thrombophlebitis, CoNS + Strep bovis bacteremia, and E.coli UTI.  Afeb, WBC WNL, TEE negative for vegetations, ID recommends at least 4 weeks of antibiotics. Best Practices: PPI IV, heparin gtt   Plan:  - Cycle Clinimix E 5/15  Over 18 hours to meet 100% of patient's needs.  Will decrease infusion time to 12 hours if tolerated. - MVI and TE MWF only due to national shortage  - - Monitor I/O's, lytes, cbgs.    Talbert Cage, PharmD Pager:  [phone]  [phone] 10/25/2011, 8:29 AM

## 2011-10-26 LAB — COMPREHENSIVE METABOLIC PANEL
ALT: 7 U/L (ref 0–35)
AST: 16 U/L (ref 0–37)
Albumin: 2.3 g/dL — ABNORMAL LOW (ref 3.5–5.2)
Calcium: 9.3 mg/dL (ref 8.4–10.5)
GFR calc Af Amer: >90 mL/min (ref >90)
Glucose, Bld: 178 mg/dL — ABNORMAL HIGH (ref 70–99)
Sodium: 134 mEq/L — ABNORMAL LOW (ref 135–145)
Total Protein: 6.3 g/dL (ref 6.0–8.3)

## 2011-10-26 LAB — GLUCOSE, CAPILLARY
Glucose-Capillary: 129 mg/dL — ABNORMAL HIGH (ref 70–99)
Glucose-Capillary: 160 mg/dL — ABNORMAL HIGH (ref 70–99)
Glucose-Capillary: 176 mg/dL — ABNORMAL HIGH (ref 70–99)
Glucose-Capillary: 199 mg/dL — ABNORMAL HIGH (ref 70–99)
Glucose-Capillary: 175 mg/dL — ABNORMAL HIGH (ref 70–99)
Glucose-Capillary: 95 mg/dL (ref 70–99)

## 2011-10-26 LAB — DIFFERENTIAL
Eosinophils Relative: 3 % (ref 0–5)
Lymphocytes Relative: 35 % (ref 12–46)
Lymphs Abs: 1.7 10*3/uL (ref 0.7–4.0)

## 2011-10-26 LAB — CBC
Hemoglobin: 9.5 g/dL — ABNORMAL LOW (ref 12.0–15.0)
MCV: 81.7 fL (ref 78.0–100.0)
Platelets: 369 10*3/uL (ref 150–400)
RBC: 3.6 MIL/uL — ABNORMAL LOW (ref 3.87–5.11)
WBC: 4.8 10*3/uL (ref 4.0–10.5)

## 2011-10-26 LAB — HEPARIN LEVEL (UNFRACTIONATED): Heparin Unfractionated: 0.71 IU/mL — ABNORMAL HIGH (ref 0.30–0.70)

## 2011-10-26 LAB — PREALBUMIN: Prealbumin: 13.8 mg/dL — ABNORMAL LOW (ref 17.0–34.0)

## 2011-10-26 LAB — CHOLESTEROL, TOTAL: Cholesterol: 94 mg/dL (ref 0–200)

## 2011-10-26 LAB — TRIGLYCERIDES: Triglycerides: 64 mg/dL (ref <150)

## 2011-10-26 MED ORDER — ENOXAPARIN SODIUM 80 MG/0.8ML ~~LOC~~ SOLN
80.0000 mg | Freq: Two times a day (BID) | SUBCUTANEOUS | Status: DC
  Filled 2011-10-26 (×2): qty 0.8

## 2011-10-26 MED ORDER — MAGNESIUM SULFATE 40 MG/ML IJ SOLN
2.0000 g | Freq: Once | INTRAMUSCULAR | Status: AC
  Administered 2011-10-26: 2 g via INTRAVENOUS
  Filled 2011-10-26: qty 50

## 2011-10-26 MED ORDER — TRACE MINERALS CR-CU-MN-SE-ZN 10-1000-500-60 MCG/ML IV SOLN
INTRAVENOUS | Status: AC
  Administered 2011-10-26: 18:00:00 via INTRAVENOUS
  Filled 2011-10-26: qty 2000

## 2011-10-26 MED ORDER — FAT EMULSION 20 % IV EMUL
250.0000 mL | INTRAVENOUS | Status: AC
  Administered 2011-10-26: 250 mL via INTRAVENOUS
  Filled 2011-10-26: qty 250

## 2011-10-26 MED ORDER — ENOXAPARIN SODIUM 80 MG/0.8ML ~~LOC~~ SOLN
80.0000 mg | Freq: Two times a day (BID) | SUBCUTANEOUS | Status: DC
  Administered 2011-10-26 – 2011-10-27 (×3): 80 mg via SUBCUTANEOUS
  Filled 2011-10-26 (×5): qty 0.8

## 2011-10-26 NOTE — Progress Notes (Addendum)
Patient ID: [name]  female  YQM:[id number]    DOB: 1931/09/19    DOA: 10/12/2011  PCP: [name], MD, MD  Interim history she has history of perforated duodenal ulcer, status post antrectomy, duodenal resection, Billroth 2 reconstruction, omental patching of Duodenal bulb, open cholecystectomy, gastrostomy and jejunostomy on 05/07/11. Her course was complicated by epigastric abscess 05/31/11. 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 Brought to the ER on 4/15 w/ fever and purulent discharge from PICC insertion site, abd pain and UA concerning for UTI. She was started on broad spectrum abx, ID has been consulted. Blood cultures grew coagulase negative staph and Streptococcus alactolyticus, she also had e coli growing from urine. Repeat blood cultures on 10/17/2011 has been negative so far. At this time the right picc has been removed and she is getting TNA wire peripheral IV. She also has central line in place. Patient has been getting IV fentanyl when necessary for pain.  10/20/2011: General surgery consulted, PEG tube clamped by surgery service 10/21/2011: Patient underwent TEE, did not show any endocarditis. Speech therapy was consulted for swallowing evaluation, however patient immediately aspirated on every consistency.  10/22/2011: I have called for palliative medicine consult for goals of care, patient will remain n.p.o., Continue TNA. I have called for medical records from patient's gastroenterologist, Dr. Raj Janus in [place]. 10/24/2011: Palliative medicine goals of care done, DNI but continue current medical management  Subjective: Per RN, did not sleep last night. No other major issues  Objective: Weight change: 0.537 kg (1 lb 3 oz)  Intake/Output Summary (Last 24 hours) at 10/26/11 0749 Last data filed at 10/26/11 0719  Gross per 24 hour  Intake   3550 ml  Output   2800 ml  Net    750 ml   Blood pressure  150/91, pulse 89, temperature 98.8 F (37.1 C), temperature source Oral, resp. rate 18, height 5\' 5"  (1.651 m), weight 82.237 kg (181 lb 4.8 oz), SpO2 96.00%.  Physical Exam: General: Alert and awake, not in any acute distress. HEENT: anicteric sclera, pupils reactive to light and accommodation, EOMI CVS: S1-S2 clear, no murmur rubs or gallops Chest: clear to auscultation bilaterally, no wheezing, rales or rhonchi Abdomen: soft nontender, PEG tube clamped Extremities: no cyanosis, clubbing or edema noted bilaterally   Lab Results: Basic Metabolic Panel:  Lab 10/24/11 5284 10/23/11 0600 10/22/11 0645  NA 134* 133* --  K 3.6 3.6 --  CL 101 99 --  CO2 26 24 --  GLUCOSE 136* 133* --  BUN 13 12 --  CREATININE 0.58 0.55 --  CALCIUM 9.3 9.2 --  MG -- 1.5 --  PHOS -- -- 4.6   Liver Function Tests:  Lab 10/22/11 0645 10/19/11 1125  AST 10 10  ALT 9 10  ALKPHOS 95 89  BILITOT 0.1* 0.2*  PROT 6.3 6.0  ALBUMIN 2.1* 1.9*     Lab 10/26/11 0630 10/25/11 0845  WBC 4.8 5.5  NEUTROABS 2.6 --  HGB 9.5* 9.6*  HCT 29.4* 29.3*  MCV 81.7 80.1  PLT 369 346  CBG:  Lab 10/26/11 0658 10/26/11 0349 10/26/11 0008 10/25/11 2043 10/25/11 1627  GLUCAP 176* 129* 160* 207* 109*     Micro Results: Recent Results (from the past 240 hour(s))  CULTURE, BLOOD (ROUTINE X 2)     Status: Normal   Collection Time   10/17/11  9:15 AM      Component Value Range Status Comment  Specimen Description BLOOD RIGHT HAND   Final    Special Requests BOTTLES DRAWN AEROBIC ONLY 10CC   Final    Culture  Setup Time [id number]   Final    Culture NO GROWTH 5 DAYS   Final    Report Status 10/23/2011 FINAL   Final   CULTURE, BLOOD (ROUTINE X 2)     Status: Normal   Collection Time   10/17/11  9:25 AM      Component Value Range Status Comment   Specimen Description BLOOD LEFT HAND   Final    Special Requests BOTTLES DRAWN AEROBIC ONLY [facility]   Final    Culture  Setup Time [id number]   Final    Culture NO  GROWTH 5 DAYS   Final    Report Status 10/23/2011 FINAL   Final     Studies/Results: Ct Humerus Right W Contrast  10/16/2011  *RADIOLOGY REPORT*  Clinical Data: Upper extremity venous thrombosis.  Redness about the elbow.  Pain.  CT OF THE RIGHT HUMERUS WITH CONTRAST  Technique:  Multidetector CT imaging was performed following the standard protocol during bolus administration of intravenous contrast.  Contrast: 80mL OMNIPAQUE IOHEXOL 300 MG/ML  SOLN  Comparison: Plain films of the elbow 12/09/2007. Report of upper extremity ultrasound 10/14/2011 reviewed.  Findings: As described report of comparison ultrasound, clot is seen in the left subclavian, axillary and brachial veins. Subclavian vein is not well demonstrated on this examination as it is not scanned in its entirety.  Clot is well demonstrated extending to a point 18 cm below the top of the humeral head. Extensive subcutaneous edema is present.  There is no focal fluid collection to suggest abscess.  No focal bony abnormality is identified.  There is some dependent atelectasis in the right lung. Imaged intra-abdominal contents are unremarkable.  IMPRESSION: Clot in the left subclavian, axillary and brachial veins as described above with associated subcutaneous edema.  Negative for abscess.  Original Report Authenticated By: Bernadene Bell. Maricela Curet, M.D.   Ct Abdomen Pelvis W Contrast  10/13/2011  *RADIOLOGY REPORT*  Clinical Data: 78 year old with sepsis.  CT ABDOMEN AND PELVIS WITH CONTRAST  Technique:  Multidetector CT imaging of the abdomen and pelvis was performed following the standard protocol during bolus administration of intravenous contrast.  Contrast: 80mL OMNIPAQUE IOHEXOL 300 MG/ML  SOLN  Comparison: 07/29/2011  Findings: There are tiny bilateral pleural effusions with basilar atelectasis.  A small amount of focal atelectasis or round atelectasis at the right lung base on sequence 3, image 7.  No evidence for free air.  Coronary artery  calcifications.  The gallbladder has been removed. Again noted is low density fluid or postsurgical changes between the stomach and left hepatic lobe, best seen on sequence 2, image 23.  This area roughly measures 2.1 cm and similar to the prior examination.  There is residual fluid or edema in the gallbladder fossa and along the inferior liver margin.  No evidence for a drainable fluid collection.  Gastrostomy tube is present.  Again noted is a large calcification near the pancreatic head and neck. There is oral contrast within the stomach and large bowel.  No gross abnormality to the liver, portal venous system, adrenal glands, kidneys or spleen.  Again noted are low density structures within the kidneys which are too small to definitively characterize but could represent cysts.  Calcifications associated with the uterus and cannot exclude fibroids.  No significant free fluid in the pelvis.  There is a right hip  replacement.  No significant lymphadenopathy. A Foley catheter within the urinary bladder.  IMPRESSION:  Postsurgical changes in the upper abdomen.  Residual low density fluid or edema around the liver has minimally changed since 07/29/2011.  Tiny pleural effusions with bibasilar atelectasis.  Original Report Authenticated By: Richarda Overlie, M.D.   Ir Fluoro Guide Cv Line Right  09/29/2011  *RADIOLOGY REPORT*  Clinical Data: Diabetes  PICC LINE PLACEMENT WITH ULTRASOUND AND FLUOROSCOPIC  GUIDANCE  Fluoroscopy Time: 0.5 minutes.  The right arm was prepped with chlorhexidine, draped in the usual sterile fashion using maximum barrier technique (cap and mask, sterile gown, sterile gloves, large sterile sheet, hand hygiene and cutaneous antisepsis) and infiltrated locally with 1% Lidocaine.  Ultrasound demonstrated patency of the right basilic vein, and this was documented with an image.  Under real-time ultrasound guidance, this vein was accessed with a 21 gauge micropuncture needle and image documentation was  performed.  The needle was exchanged over a guidewire for a peel-away sheath through which a five [place] double lumen PICC trimmed to 40 cm was advanced, positioned with its tip at the lower SVC/right atrial junction.  Fluoroscopy during the procedure and fluoro spot radiograph confirms appropriate catheter position.  The catheter was flushed, secured to the skin with Prolene sutures, and covered with a sterile dressing.  Complications:  None  IMPRESSION: Successful right arm PICC line placement with ultrasound and fluoroscopic guidance.  The catheter is ready for use.  Original Report Authenticated By: Donavan Burnet, M.D.   Ir US Guide Vasc Access Right  09/29/2011  *RADIOLOGY REPORT*  Clinical Data: Diabetes  PICC LINE PLACEMENT WITH ULTRASOUND AND FLUOROSCOPIC  GUIDANCE  Fluoroscopy Time: 0.5 minutes.  The right arm was prepped with chlorhexidine, draped in the usual sterile fashion using maximum barrier technique (cap and mask, sterile gown, sterile gloves, large sterile sheet, hand hygiene and cutaneous antisepsis) and infiltrated locally with 1% Lidocaine.  Ultrasound demonstrated patency of the right basilic vein, and this was documented with an image.  Under real-time ultrasound guidance, this vein was accessed with a 21 gauge micropuncture needle and image documentation was performed.  The needle was exchanged over a guidewire for a peel-away sheath through which a five [place] double lumen PICC trimmed to 40 cm was advanced, positioned with its tip at the lower SVC/right atrial junction.  Fluoroscopy during the procedure and fluoro spot radiograph confirms appropriate catheter position.  The catheter was flushed, secured to the skin with Prolene sutures, and covered with a sterile dressing.  Complications:  None  IMPRESSION: Successful right arm PICC line placement with ultrasound and fluoroscopic guidance.  The catheter is ready for use.  Original Report Authenticated By: Donavan Burnet, M.D.   Dg  Chest Port 1 View  10/13/2011  *RADIOLOGY REPORT*  Clinical Data: Line placement.  PORTABLE CHEST - 1 VIEW  Comparison: 10/12/2011  Findings: Cardiomegaly with vascular congestion.  Diffuse interstitial prominence may reflect early interstitial edema.  No confluent opacities or effusions.  No acute bony abnormality.  Left central line is in place.  The tip is in the upper right atrium.  No pneumothorax.  IMPRESSION: Cardiomegaly, vascular congestion.  Question early interstitial edema.  Original Report Authenticated By: Cyndie Chime, M.D.   Dg Chest Port 1 View  10/12/2011  *RADIOLOGY REPORT*  Clinical Data: Fever.  Drainage about a PICC.  PORTABLE CHEST - 1 VIEW  Comparison: Fluoroscopic spot view from PICC placement 09/29/2011. Plain films of the chest 07/28/2011.  CT chest 01/26/2011.  Findings: The patient's right PICC has backed out with the tip in the right axilla.  Lungs are clear.  Heart size is normal.  No pneumothorax or pleural effusion.  IMPRESSION:  1.  Tip of the patient's right PICC is now in the right axilla. 2.  No acute cardiopulmonary disease.  Original Report Authenticated By: Bernadene Bell. Maricela Curet, M.D.   Dg Abd Portable 2v  10/13/2011  *RADIOLOGY REPORT*  Clinical Data: Abdominal pain.  PORTABLE ABDOMEN - 2 VIEW  Comparison: CT 10/13/2011  Findings: Oral contrast material seen within the colon.  There is a nonobstructive bowel gas pattern.  No free air on the decubitus views.  No organomegaly.  IMPRESSION: No obstruction or free air.  Original Report Authenticated By: Cyndie Chime, M.D.    Medications: Scheduled Meds:    . amLODipine  10 mg Per Tube Daily  . cefTRIAXone (ROCEPHIN)  IV  2 g Intravenous Q24H  . cloNIDine  0.2 mg Transdermal Weekly  . fentaNYL  25 mcg Transdermal Q72H  . insulin aspart  0-9 Units Subcutaneous Q4H  . metoprolol  5 mg Intravenous Once  . metoprolol tartrate  50 mg Per Tube BID  . metroNIDAZOLE  500 mg Oral Q8H  . pantoprazole (PROTONIX) IV   40 mg Intravenous Q24H  . sodium chloride  10-40 mL Intracatheter Q12H  . vancomycin  1,250 mg Intravenous Q12H   Continuous Infusions:    . sodium chloride 500 mL (10/20/11 1127)  . TPN (CLINIMIX) +/- additives 80 mL/hr at 10/24/11 1809   And  . fat emulsion 250 mL (10/24/11 1808)  . TPN (CLINIMIX) +/- additives 114 mL/hr at 10/25/11 2300   And  . fat emulsion 250 mL (10/25/11 2300)  . heparin 1,700 Units/hr (10/25/11 2300)     Assessment/Plan:  Sepsis, Coag negative staph and strept Bacteremia,( PCN sensitive Strep BOVIS species from 2/2 admission cultures, and a Clostridium perfringens species from 1/2 cultures per ID, Dr Daiva Eves):  - Continue vancomycin and Rocephin per ID  - TEE negative for any endocarditis, GI records pending  Hypertension: Much better controlled now -Continue clonidine patch, Norvasc,  beta blocker via Gtube  DIABETES MELLITUS, TYPE ZO:XWRU SSI , stable  Hyponatremia: Stable for now, Sec to GI losses.   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 Patient may not be a good candidate for coumadin currently, she is on TNA, NPO, PEG tube is clamped   Escherichia coli UTI (lower urinary tract infection): Most likely colonization as per ID   H/O C diff:Treated in February:No diarrhea at this time   Chronic pain: controlled, On Fentanyl patch 25 mcg/hr Q 72 hr, PRN morphine   Dementia; pleasantly confused    DVT Prophylaxis: On heparin drip, to change to lovenox today  Code Status: Full code  Disposition: palliative medicine goals done, family  requested continue medical management.  ?possibility of LTAC for disposition as patient appears to be stable but needs continuous medical management, I will check with social Investment banker, operational today. If not, then disposition to SNF if TNA can  be done at SNF.   LOS: 14 days   Hurshel Bouillon M.D. [facility] 10/26/2011, 7:49 AM Pager: [phone]   Addendum: I discussed with Dr. Algis Liming in detail from ID service, recommended to continue the antibiotics for 4-6 weeks, follow up as an outpatient in the ID clinic.  Patient is medically stable, heparin drip discontinued, placed on full dose Lovenox. Hopefully will DC tomorrow to skilled nursing facility if bed available.   Carlyon Nolasco M.D. [facility] 10/26/2011, 5:26 PM  Pager: [phone]

## 2011-10-26 NOTE — Progress Notes (Signed)
Utilization review completed.  

## 2011-10-26 NOTE — Progress Notes (Signed)
PARENTERAL NUTRITION CONSULT NOTE - FOLLOW UP   Pharmacy Consult for TPN Indication: chronic TPN at home for short - gut syndrome  Allergies  Allergen Reactions  . Other Swelling    Pinto beans cause swelling and hives  . Penicillins Swelling    Patient Measurements: Height: 5\' 5"  (165.1 cm) Weight: 181 lb 4.8 oz (82.237 kg) IBW/kg (Calculated) : 57  Usual Weight: 162 lbs  Vital Signs: Temp: 98.8 F (37.1 C) (04/29 0532) Temp src: Oral (04/29 0532) BP: 150/91 mmHg (04/29 0532) Pulse Rate: 89  (04/29 0532) Intake/Output from previous day: 04/28 0701 - 04/29 0700 In: 3550 [IV Piggyback:3550] Out: 2300 [Urine:2300]  Labs:  Basename 10/26/11 0630 10/25/11 0845 10/24/11 0515  WBC 4.8 5.5 5.7  HGB 9.5* 9.6* 9.4*  HCT 29.4* 29.3* 28.7*  PLT 369 346 370  APTT -- -- --  INR -- -- --     Basename 10/26/11 0630 10/24/11 0515  NA 134* 134*  K 3.7 3.6  CL 101 101  CO2 24 26  GLUCOSE 178* 136*  BUN 14 13  CREATININE 0.50 0.58  LABCREA -- --  CREAT24HRUR -- --  CALCIUM 9.3 9.3  MG 1.4* --  PHOS 4.5 --  PROT 6.3 --  ALBUMIN 2.3* --  AST 16 --  ALT 7 --  ALKPHOS 80 --  BILITOT 0.1* --  BILIDIR -- --  IBILI -- --  PREALBUMIN 13.8* --  TRIG 64 --  CHOLHDL -- --  CHOL 94 --   Estimated Creatinine Clearance: 61.4 ml/min (by C-G formula based on Cr of 0.5).    Basename 10/26/11 1302 10/26/11 1133 10/26/11 0849  GLUCAP 95 175* 199*     Insulin Requirements in the past 24 hours:  9 units Novolog SSI and 50 unit regular insulin in TPN  Current Nutrition: Ice chips   Nutritional Goals:  1650-1850 kCal, 85-100 grams of protein per day   Assessment:  48 YOF with h/o short-gut syndrome admitted from [facility] where she is on chronic TPN and takes POs only for pleasure. TPN cycled over 18 hours last night.  CBG on TPN 199, cbg off TPN 95.  PICC line placed in left arm.  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  Severe risk for aspiration and Speech recommends continuing on TNA. Endo: hx DM, CBGs 95-199, adequately controlled on SSI and insulin in TPN (was on metformin + 65 units insulin daily PTA) Lytes:  persistent mild hyponatremia--unable to correct with pre-made Clinimix, K 3.7, Phos 4.5, mag 1.4, corr  Ca = 10.66, corr Ca x phos= 48 Renal: SCr stable, NS at [facility], UOP 0.9 ml/kg/hr Pulm: stable on RA Cards: hx HTN - BP elevated, HR 90's, on scheduled lopressor and amlodipine. Heparin gtt changed to lovenox Hepatobil: LFTs/tbili WNL, hgb/plts stable Neuro: hx depression (seroquel / trazodone PTA), on fentanyl patch ID:  Vanc D#12, CTX MD#7, Flagyl MD#6 (s/p Fortaz, Flagyl, gent, Cefepime, Azactam) for septic thrombophlebitis, CoNS + Strep bovis bacteremia, and E.coli UTI.  Afeb, WBC WNL, TEE negative for vegetations, ID recommends at least 4 weeks of antibiotics. Best Practices: PPI IV, lovenox   Plan:  - Cont cycle Clinimix E 5/15  Over 18 hours to meet 100% of patient's needs.  - MVI and TE MWF only due to  national shortage  - - Monitor I/O's, lytes, cbgs.    [name], PharmD Pager:  [phone] 10/26/2011, 1:12 PM

## 2011-10-26 NOTE — Progress Notes (Signed)
Patient ID: [name], female   DOB: 04/21/1932, 78 y.o.   MRN: [id number] I have reviewed the palliative goals of care.  The patient and family to do want any further surgery at this time and to remain on TNA.  We will have her follow up with [name] as an outpatient.  No further surgical intervention wanted.  Will sign off.  [name] E 11:09 AM 10/26/2011

## 2011-10-26 NOTE — Progress Notes (Signed)
ANTICOAGULATION CONSULT NOTE - Follow Up Consult  Pharmacy Consult : Changing from IV Heparin drip to SQ Lovenox Indication: DVT at site of PICC    Allergies  Allergen Reactions  . Other Swelling    Pinto beans cause swelling and hives  . Penicillins Swelling    Patient Measurements: Height: 5\' 5"  (165.1 cm) Weight: 181 lb 4.8 oz (82.237 kg) IBW/kg (Calculated) : 57  Heparin Dosing Weight: 75 kg  Vital Signs: Temp: 98.8 F (37.1 C) (04/29 0532) Temp src: Oral (04/29 0532) BP: 150/91 mmHg (04/29 0532) Pulse Rate: 89  (04/29 0532)  Labs: Vancomycin Trough =17.8 mcg/ml  @0830  10/22/11   Basename 10/26/11 0630 10/25/11 0845 10/24/11 0515  HGB 9.5* 9.6* --  HCT 29.4* 29.3* 28.7*  PLT 369 346 370  APTT -- -- --  LABPROT -- -- --  INR -- -- --  HEPARINUNFRC 0.71* 0.88* 0.67  CREATININE 0.50 -- 0.58  CKTOTAL -- -- --  CKMB -- -- --  TROPONINI -- -- --   Estimated Creatinine Clearance: 61.4 ml/min (by C-G formula based on Cr of 0.5).   Medications:  Infusions:     . sodium chloride 500 mL (10/20/11 1127)  . TPN (CLINIMIX) +/- additives 80 mL/hr at 10/24/11 1809   And  . fat emulsion 250 mL (10/24/11 1808)  . TPN (CLINIMIX) +/- additives 114 mL/hr at 10/25/11 2300   And  . fat emulsion 250 mL (10/25/11 2300)  . DISCONTD: heparin 1,700 Units/hr (10/25/11 2300)    Assessment:  78yo F on heparin for RUE DVT at PICC site.  Heparin level = 0.71 on 1700 units/hr.  CBC remains stable with no bleeding reported. MD request to change IV heparin to SQ Lovenox.   Goal of Therapy:  4hr heparin level (LMWH) = 0.6-1.2 units/ml   Plan:  DC IV heparin drip now.   1 hour after IV heparin dc'd , start SQ Lovenox 80mg  SQ q12hr. CBC q72hr   Noah Delaine, RPh Clinical Pharmacist 10/26/2011 08:47

## 2011-10-27 LAB — CBC
HCT: 28.1 % — ABNORMAL LOW (ref 36.0–46.0)
MCH: 25.8 pg — ABNORMAL LOW (ref 26.0–34.0)
MCV: 80.5 fL (ref 78.0–100.0)
Platelets: 308 10*3/uL (ref 150–400)
RDW: 17.7 % — ABNORMAL HIGH (ref 11.5–15.5)

## 2011-10-27 LAB — GLUCOSE, CAPILLARY
Glucose-Capillary: 144 mg/dL — ABNORMAL HIGH (ref 70–99)
Glucose-Capillary: 165 mg/dL — ABNORMAL HIGH (ref 70–99)

## 2011-10-27 MED ORDER — DEXTROSE 5 % IV SOLN: 2.0000 g | INTRAVENOUS | Status: DC

## 2011-10-27 MED ORDER — OXYCODONE HCL 5 MG PO TABS: 5.0000 mg | ORAL_TABLET | ORAL | Status: AC | PRN

## 2011-10-27 MED ORDER — ACETAMINOPHEN 500 MG PO TABS: 500.0000 mg | ORAL_TABLET | Freq: Every day | ORAL | Status: DC | PRN

## 2011-10-27 MED ORDER — AMLODIPINE BESYLATE 10 MG PO TABS: 10.0000 mg | ORAL_TABLET | Freq: Every day | ORAL | Status: DC

## 2011-10-27 MED ORDER — FLUCONAZOLE 150 MG PO TABS
150.0000 mg | ORAL_TABLET | Freq: Once | ORAL | Status: AC
  Administered 2011-10-27: 150 mg
  Filled 2011-10-27: qty 1

## 2011-10-27 MED ORDER — ONDANSETRON HCL 4 MG PO TABS: 4.0000 mg | ORAL_TABLET | Freq: Three times a day (TID) | ORAL | Status: DC | PRN

## 2011-10-27 MED ORDER — VANCOMYCIN HCL 1000 MG IV SOLR: 1250.0000 mg | Freq: Two times a day (BID) | INTRAVENOUS | Status: DC

## 2011-10-27 MED ORDER — METOPROLOL TARTRATE 50 MG PO TABS: 50.0000 mg | ORAL_TABLET | Freq: Two times a day (BID) | ORAL | Status: DC

## 2011-10-27 MED ORDER — HEPARIN SOD (PORK) LOCK FLUSH 100 UNIT/ML IV SOLN
500.0000 [IU] | Freq: Once | INTRAVENOUS | Status: AC
  Administered 2011-10-27: 250 [IU] via INTRAVENOUS

## 2011-10-27 MED ORDER — TRAZODONE HCL 50 MG PO TABS: 25.0000 mg | ORAL_TABLET | Freq: Every evening | ORAL | Status: DC | PRN

## 2011-10-27 MED ORDER — PANTOPRAZOLE SODIUM 40 MG PO TBEC: 40.0000 mg | DELAYED_RELEASE_TABLET | Freq: Every day | ORAL | Status: DC

## 2011-10-27 MED ORDER — INSULIN ASPART 100 UNIT/ML ~~LOC~~ SOLN: 0.0000 [IU] | SUBCUTANEOUS | Status: DC

## 2011-10-27 MED ORDER — FENTANYL 25 MCG/HR TD PT72: 1.0000 | MEDICATED_PATCH | TRANSDERMAL | Status: DC

## 2011-10-27 MED ORDER — ENOXAPARIN SODIUM 80 MG/0.8ML ~~LOC~~ SOLN: 80.0000 mg | Freq: Two times a day (BID) | SUBCUTANEOUS | Status: DC

## 2011-10-27 MED ORDER — METRONIDAZOLE 500 MG PO TABS: 500.0000 mg | ORAL_TABLET | Freq: Three times a day (TID) | ORAL | Status: DC

## 2011-10-27 MED ORDER — CLOTRIMAZOLE 1 % VA CREA
1.0000 | TOPICAL_CREAM | Freq: Two times a day (BID) | VAGINAL | Status: DC
  Administered 2011-10-27: 1 via VAGINAL
  Filled 2011-10-27: qty 45

## 2011-10-27 MED ORDER — CLOTRIMAZOLE 1 % VA CREA: 1.0000 | TOPICAL_CREAM | Freq: Two times a day (BID) | VAGINAL | Status: AC

## 2011-10-27 MED ORDER — CLONIDINE HCL 0.2 MG/24HR TD PTWK: 1.0000 | MEDICATED_PATCH | TRANSDERMAL | Status: DC

## 2011-10-27 NOTE — Progress Notes (Signed)
10/27/2011 Shimon Trow[place] Elizabeth PTA [phone] pager [phone] office    

## 2011-10-27 NOTE — Discharge Summary (Signed)
Physician Discharge Summary  Patient ID: [name] MRN: [id number] DOB/AGE: 06/30/1931 78 y.o.  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 right upper extremity DVT on therapeutic Lovenox Nutrition: On TNA  Consults:  General surgery, Dr. Karie Soda                     Infectious disease, Dr. Algis Liming                     Cardiology                         Palliative medicine, Dr. Sharl Ma                      Critical care service       Discharge Medications: Medication List  As of 10/27/2011  9:52 AM   STOP taking these medications         benazepril 20 MG tablet      calcium-vitamin D 500-200 MG-UNIT per tablet      cloNIDine 0.1 mg/24hr patch      IMODIUM PO      insulin regular 100 units/mL injection      metFORMIN 1000 MG tablet      mulitivitamin with minerals Tabs      oxyCODONE 5 MG/5ML solution      QUEtiapine 25 MG tablet         TAKE these medications         acetaminophen 500 MG tablet   Commonly known as: TYLENOL   Place 1 tablet (500 mg total) into feeding tube daily as needed. For pain      amLODipine 10 MG tablet   Commonly known as: NORVASC   Place 1 tablet (10 mg total) into feeding tube daily.      butalbital-acetaminophen-caffeine 50-325-40 MG per tablet   Commonly known as: FIORICET, ESGIC   Take 2 tablets by mouth every 8 (eight) hours as needed. For headache      cloNIDine 0.2  mg/24hr patch   Commonly known as: CATAPRES - Dosed in mg/24 hr   Place 1 patch (0.2 mg total) onto the skin once a week.      clotrimazole 1 % vaginal cream   Commonly known as: GYNE-LOTRIMIN   Place 1 Applicatorful vaginally 2 (two) times daily.      darifenacin 15 MG 24 hr tablet   Commonly known as: ENABLEX   Take 15 mg by mouth daily.      dextrose 5 % SOLN 50 mL with cefTRIAXone 2 G SOLR 2 g   Inject 2 g into the vein daily. X 4 weeks, follow-up with Dr. Daiva Eves (ID) prior to stopping it      enoxaparin 80 MG/0.8ML injection   Commonly known as: LOVENOX   Inject 0.8 mLs (80 mg total) into the skin every 12 (twelve) hours.      ergocalciferol 50000 UNITS capsule   Commonly known as: VITAMIN D2  Take 50,000 Units by mouth once a week.      fentaNYL 25 MCG/HR   Commonly known as: DURAGESIC - dosed mcg/hr   Place 1 patch (25 mcg total) onto the skin every 3 (three) days.      insulin aspart 100 UNIT/ML injection   Commonly known as: novoLOG   Inject 0-15 Units into the skin every 4 (four) hours. CBG 70 - 120: 0 units: CBG 121 - 150: 2 units; CBG 151 - 200: 3 units; CBG 201 - 250: 5 units; CBG 251 - 300: 8 units;CBG 301 - 350: 11 units; CBG 351 - 400: 15 units; CBG > 400 : 15 units and Call MD      metoprolol 50 MG tablet   Commonly known as: LOPRESSOR   Place 1 tablet (50 mg total) into feeding tube 2 (two) times daily.      metroNIDAZOLE 500 MG tablet   Commonly known as: FLAGYL   Take 1 tablet (500 mg total) by mouth every 8 (eight) hours. X 4 weeks, please donot stop prior to Dr. Daiva Eves (ID) follow-up      ondansetron 4 MG tablet   Commonly known as: ZOFRAN   Place 1 tablet (4 mg total) into feeding tube every 8 (eight) hours as needed. For nausea      oxyCODONE 5 MG immediate release tablet   Commonly known as: Oxy IR/ROXICODONE   Place 1 tablet (5 mg total) into feeding tube every 4 (four) hours as needed.      pantoprazole 40 MG tablet   Commonly known as:  PROTONIX   Take 1 tablet (40 mg total) by mouth daily.      PRESCRIPTION MEDICATION   Inject into the vein continuous. Home TPN.  Patient gets supply from [facility] [phone]).      sodium chloride 0.9 % SOLN 250 mL with vancomycin 1000 MG SOLR 1,250 mg   Inject 1,250 mg into the vein every 12 (twelve) hours. X 4 weeks, please donot stop prior to Dr. Daiva Eves (ID) follow-up      traZODone 50 MG tablet   Commonly known as: DESYREL   Place 0.5-1 tablets (25-50 mg total) into feeding tube at bedtime as needed. For sleep             Brief H and P: For complete details please refer to admission H and P, but in brief, 78 year old female with history of perforated prepyloric gastric ulcer status post surgery and PEG tube placement, diabetes mellitus2, hypertension was brought to the ER because of fever. There was a concern about patient's PICC line being infected. Patient in the ER was found to be hypotensive with high lactic acid levels and eventually was found to have high procalcitonin levels too. Critical care was initially consulted by the ER physician but patient's blood pressure was responding to fluids so critical care requested hospitalist admission.  Labs revealed patient may have had a UTI. Patient has already received 5 L normal saline. Patient was admitted for sepsis source most likely urinary tract/abdomen.    Hospital Course:  Patient is a 78 year old female with history of perforated duodenal ulcer, status post antrectomy, duodenal resection, Billroth 2 reconstruction, omental patching of Duodenal bulb, open cholecystectomy, gastrostomy and jejunostomy on 05/07/11. Her course was complicated by epigastric abscess 05/31/11. She was noted to have malnutrition and short-gut syndrome requiring chronic TNA through PICC line.  Patient was brought to the ER on 4/15/20134 with fevers/sepsis/septic  shock  and purulent discharge from PICC insertion site, abdominal pain and UA  concerning for UTI. She was started on broad spectrum abx and patient was admitted to ICU. ID was consulted as Blood cultures grew coagulase negative staph and Streptococcus alactolyticus, she also had e coli growing from urine. Patient was closely followed by Dr. Algis Liming throughout the hospitalization. PEG tube was also placed to intermittent wall suction due to purulent discharge. There was also question of infected right PICC line possibly with septic thrombophlebitis. However Doppler ultrasound was positive for DVT and patient was started on heparin drip. Critical care service was consulted for septic shock on 10/13/2011 when she again became hypotensive with SBP in 40s to 60s and acuity of patient's illness. PICC line was removed and the tip grew staph aureus coag negative bacteria. There was a question of septic thrombophlebitis, patient was seen by vascular surgery, felt patient had a cellulitis of the right arm with DVT but no abscess and no surgical intervention is needed. Vascular surgery recommended continuing with antibiotics and anticoagulation. PICC line was placed on the left arm and TNA was restarted. The patient was transferred back to hospitalist service on 10/17/2011. Repeat blood cultures on 10/17/2011 has been negative so far.  10/20/2011: General surgery was consulted, PEG tube was clamped by surgery service  10/21/2011: Given the initial persistent bacteremia, cardiology was consulted. Patient underwent TEE which did not show any endocarditis. Speech therapy was consulted for swallowing evaluation, however patient immediately aspirated on every consistency.  10/22/2011: Palliative medicine consult for goals of care was placed.  10/24/2011: Palliative medicine goals of care were done. Per patient's family's wishes she was placed on limited code, DNI but they want compression, defibrillation, antiarrhythmics. Continue TNA for nutrition and they requested no surgical intervention.    Day of  Discharge BP 128/74  Pulse 76  Temp(Src) 98.3 F (36.8 C) (Oral)  Resp 18  Ht 5\' 5"  (1.651 m)  Wt 83.7 kg (184 lb 8.4 oz)  BMI 30.71 kg/m2  SpO2 97%  Physical Exam:  General: Alert and awake, not in any acute distress.  HEENT: anicteric sclera, pupils reactive to light and accommodation, EOMI  CVS: S1-S2 clear, no murmur rubs or gallops  Chest: clear to auscultation bilaterally, no wheezing, rales or rhonchi  Abdomen: soft nontender, PEG tube clamped  Extremities: no cyanosis, clubbing or edema noted bilaterally   The results of significant diagnostics from this hospitalization (including imaging, microbiology, ancillary and laboratory) are listed below for reference.    LAB RESULTS: Basic Metabolic Panel:  Lab 10/26/11 3086 10/24/11 0515  NA 134* 134*  K 3.7 3.6  CL 101 101  CO2 24 26  GLUCOSE 178* 136*  BUN 14 13  CREATININE 0.50 0.58  CALCIUM 9.3 9.3  MG 1.4* --  PHOS 4.5 --   Liver Function Tests:  Lab 10/26/11 0630 10/22/11 0645  AST 16 10  ALT 7 9  ALKPHOS 80 95  BILITOT 0.1* 0.1*  PROT 6.3 6.3  ALBUMIN 2.3* 2.1*   CBC:  Lab 10/27/11 0545 10/26/11 0630  WBC 4.2 4.8  NEUTROABS -- 2.6  HGB 9.0* 9.5*  HCT 28.1* 29.4*  MCV 80.5 --  PLT 308 369  CBG:  Lab 10/27/11 0742 10/27/11 0331  GLUCAP 165* 144*    Significant Diagnostic Studies:  Ct Abdomen Pelvis W Contrast  10/13/2011  *RADIOLOGY REPORT*  Clinical Data: 78 year old with sepsis.  CT ABDOMEN AND PELVIS WITH CONTRAST  Technique:  Multidetector CT imaging  of the abdomen and pelvis was performed following the standard protocol during bolus administration of intravenous contrast.  Contrast: 80mL OMNIPAQUE IOHEXOL 300 MG/ML  SOLN  Comparison: 07/29/2011  Findings: There are tiny bilateral pleural effusions with basilar atelectasis.  A small amount of focal atelectasis or round atelectasis at the right lung base on sequence 3, image 7.  No evidence for free air.  Coronary artery calcifications.  The  gallbladder has been removed. Again noted is low density fluid or postsurgical changes between the stomach and left hepatic lobe, best seen on sequence 2, image 23.  This area roughly measures 2.1 cm and similar to the prior examination.  There is residual fluid or edema in the gallbladder fossa and along the inferior liver margin.  No evidence for a drainable fluid collection.  Gastrostomy tube is present.  Again noted is a large calcification near the pancreatic head and neck. There is oral contrast within the stomach and large bowel.  No gross abnormality to the liver, portal venous system, adrenal glands, kidneys or spleen.  Again noted are low density structures within the kidneys which are too small to definitively characterize but could represent cysts.  Calcifications associated with the uterus and cannot exclude fibroids.  No significant free fluid in the pelvis.  There is a right hip replacement.  No significant lymphadenopathy. A Foley catheter within the urinary bladder.  IMPRESSION:  Postsurgical changes in the upper abdomen.  Residual low density fluid or edema around the liver has minimally changed since 07/29/2011.  Tiny pleural effusions with bibasilar atelectasis.  Original Report Authenticated By: Richarda Overlie, M.D.   Dg Chest Port 1 View  10/13/2011  *RADIOLOGY REPORT*  Clinical Data: Line placement.  PORTABLE CHEST - 1 VIEW  Comparison: 10/12/2011  Findings: Cardiomegaly with vascular congestion.  Diffuse interstitial prominence may reflect early interstitial edema.  No confluent opacities or effusions.  No acute bony abnormality.  Left central line is in place.  The tip is in the upper right atrium.  No pneumothorax.  IMPRESSION: Cardiomegaly, vascular congestion.  Question early interstitial edema.  Original Report Authenticated By: Cyndie Chime, M.D.   Dg Chest Port 1 View  10/12/2011  *RADIOLOGY REPORT*  Clinical Data: Fever.  Drainage about a PICC.  PORTABLE CHEST - 1 VIEW  Comparison:  Fluoroscopic spot view from PICC placement 09/29/2011. Plain films of the chest 07/28/2011.  CT chest 01/26/2011.  Findings: The patient's right PICC has backed out with the tip in the right axilla.  Lungs are clear.  Heart size is normal.  No pneumothorax or pleural effusion.  IMPRESSION:  1.  Tip of the patient's right PICC is now in the right axilla. 2.  No acute cardiopulmonary disease.  Original Report Authenticated By: Bernadene Bell. Maricela Curet, M.D.   Dg Abd Portable 2v  10/13/2011  *RADIOLOGY REPORT*  Clinical Data: Abdominal pain.  PORTABLE ABDOMEN - 2 VIEW  Comparison: CT 10/13/2011  Findings: Oral contrast material seen within the colon.  There is a nonobstructive bowel gas pattern.  No free air on the decubitus views.  No organomegaly.  IMPRESSION: No obstruction or free air.  Original Report Authenticated By: Cyndie Chime, M.D.     Disposition and Follow-up: Discharge Orders    Future Appointments: Provider: Department: Dept Phone: Center:          11/17/2011 2:00 PM Randall Hiss, MD [facility] [phone] [facility]     Future Orders Please Complete By Expires  Increase activity slowly      Discharge instructions      Comments:   1) Strict NPO, continue meds via Tube, continue nutrition via TPN.    2) weekly BMET, CBC, vanc trough       DISPOSITION:Skilled nursing facility  DIET: TNA  ACTIVITY: As tolerated   DISCHARGE FOLLOW-UP Follow-up Information    Call Ardeth Sportsman., MD. (As needed)    Contact information:   [facility] [address] [place] [address] [phone]       Follow up with Acey Lav, MD on 11/17/2011. (at 2:00 PM)    Contact information:   [address] [place] [address] [phone]          Time spent on Discharge: 45 minutes   Signed:  Isiah Scheel M.D. [facility] 10/27/2011, 9:52 AM

## 2011-10-27 NOTE — Progress Notes (Signed)
10/27/2011 [facility], [facility]ie Clos [facility] Case Management Note [phone]    CARE MANAGEMENT NOTE 10/27/2011  Patient:  [name], [name]   Account Number:  [id number]  Date Initiated:  10/13/2011  Documentation initiated by:  Fransico Michael  Subjective/Objective Assessment:   admitted on 10/12/11 with c/o fever     Action/Plan:   BLD cultures  urine Cultures   Anticipated DC Date:  10/27/2011   Anticipated DC Plan:  SKILLED NURSING FACILITY      DC Planning Services  CM consult      Choice offered to / List presented to:             Status of service:  Completed, signed off Medicare Important Message given?   (If response is "NO", the following Medicare IM given date fields will be blank) Date Medicare IM given:   Date Additional Medicare IM given:    Discharge Disposition:  SKILLED NURSING FACILITY  Per UR Regulation:  Reviewed for med. necessity/level of care/duration of stay  If discussed at Long Length of Stay Meetings, dates discussed:    Comments:  PCP: Bing PlumeLangston Reusing [phone]  10/27/11-0953- J.Kyani Simkin,RN,BSN [phone]      Patient being discharged today to SNF. No further discharge or home needs identified.  10/13/11-1213-J.Lutricia Horsfall [phone]      78yo female patient admitted from [facility] with c/o fevers. Noted to be septic. Blood and urine cultures obtained. Patient was admitted with a PICC line from facility and also has a PEG tube. Anticipated return to SNF when medically stable. CM will continue to follow for discharge needs.

## 2011-10-27 NOTE — Progress Notes (Signed)
Clinical Social Worker facilitated pt discharge needs including contacting facility, pt HCPOA, Clara, and arranging ambulance transportation for pt at 3:30pm to [facility]. Pt HCPOA asked if pt RN can contact her to update her, RN notified and plans to contact HCPOA. No further social work needs identified at this time. Clinical Social Worker signing off.   Jacklynn Lewis, MSW, LCSWA  Clinical Social Work [phone]

## 2011-10-27 NOTE — Progress Notes (Signed)
Physical Therapy Treatment [name] MRN: [id number] DOB: January 11, 1932 Today's Date: 10/27/2011 Time: 4010-2725 PT Time Calculation (min): 17 min  PT Assessment / Plan / Recommendation Comments on Treatment Session  Pt was more alert and able to participate with transfer.    Follow Up Recommendations  Skilled nursing facility    Equipment Recommendations  Defer to next venue    Frequency Min 2X/week   Plan Discharge plan remains appropriate;Frequency remains appropriate    Precautions / Restrictions Precautions Precautions: Fall   Pertinent Vitals/Pain     Mobility  Bed Mobility Bed Mobility: Left Sidelying to Sit;Rolling Left;Sitting - Scoot to Edge of Bed Rolling Left: 5: Supervision;With rail Left Sidelying to Sit: 5: Supervision;With rails;HOB flat Sitting - Scoot to Edge of Bed: 5: Supervision Details for Bed Mobility Assistance: Pt needed VC's for sequencing with bed mobility  Transfers Transfers: Sit to Stand;Stand to Sit;Stand Pivot Transfers Sit to Stand: With upper extremity assist;From bed;4: Min guard Stand to Sit: To chair/3-in-1;With upper extremity assist;4: Min guard;With armrests Stand Pivot Transfers: 4: Min assist Details for Transfer Assistance: Pt able to perform task with VC's and assistance to control descent to chair.     Exercises     PT Goals Acute Rehab PT Goals PT Goal: Supine/Side to Sit - Progress: Progressing toward goal PT Goal: Sit to Stand - Progress: Progressing toward goal PT Goal: Stand to Sit - Progress: Progressing toward goal  Visit Information  Last PT Received On: 10/27/11 Assistance Needed: +1    Subjective Data  Subjective: Pt c/o of being cold. Pt more alert during treatment    Cognition  Overall Cognitive Status: History of cognitive impairments - at baseline Area of Impairment: Memory;Problem solving Arousal/Alertness: Awake/alert Behavior During Session: [facility] for tasks performed     Balance     End of Session      [name] 10/27/2011, 1:25 PM

## 2011-10-27 NOTE — Progress Notes (Signed)
SLP Discharge Note  SLP services are signing off as patient is NPO with plan for D/C to SNF.  Consider PO for pleasure at SNF level if patient desires, with known aspiration risks.  Myra Rude, M.S.,CCC-SLP Pager [phone] 10/27/2011, 12:38 PM

## 2011-11-02 ENCOUNTER — Encounter (HOSPITAL_COMMUNITY): Payer: Self-pay | Admitting: *Deleted

## 2011-11-02 ENCOUNTER — Emergency Department (HOSPITAL_COMMUNITY)
Admission: EM | Admit: 2011-11-02 | Discharge: 2011-11-02 | Disposition: A | Discharge status: Home / Self Care | Payer: Medicare HMO | Attending: Emergency Medicine | Admitting: Emergency Medicine

## 2011-11-02 DIAGNOSIS — I10 Essential (primary) hypertension: Secondary | ICD-10-CM | POA: Insufficient documentation

## 2011-11-02 DIAGNOSIS — F068 Other specified mental disorders due to known physiological condition: Secondary | ICD-10-CM | POA: Insufficient documentation

## 2011-11-02 DIAGNOSIS — R1110 Vomiting, unspecified: Secondary | ICD-10-CM | POA: Insufficient documentation

## 2011-11-02 DIAGNOSIS — K219 Gastro-esophageal reflux disease without esophagitis: Secondary | ICD-10-CM | POA: Insufficient documentation

## 2011-11-02 DIAGNOSIS — Z794 Long term (current) use of insulin: Secondary | ICD-10-CM | POA: Insufficient documentation

## 2011-11-02 DIAGNOSIS — R109 Unspecified abdominal pain: Secondary | ICD-10-CM | POA: Insufficient documentation

## 2011-11-02 DIAGNOSIS — M1990 Unspecified osteoarthritis, unspecified site: Secondary | ICD-10-CM | POA: Insufficient documentation

## 2011-11-02 DIAGNOSIS — E119 Type 2 diabetes mellitus without complications: Secondary | ICD-10-CM | POA: Insufficient documentation

## 2011-11-02 LAB — CBC
HCT: 33.9 % — ABNORMAL LOW (ref 36.0–46.0)
MCH: 26.5 pg (ref 26.0–34.0)
MCV: 80.1 fL (ref 78.0–100.0)
RDW: 17 % — ABNORMAL HIGH (ref 11.5–15.5)
WBC: 5.7 10*3/uL (ref 4.0–10.5)

## 2011-11-02 LAB — URINALYSIS, ROUTINE W REFLEX MICROSCOPIC
Ketones, ur: NEGATIVE mg/dL
Nitrite: NEGATIVE
Protein, ur: NEGATIVE mg/dL

## 2011-11-02 LAB — COMPREHENSIVE METABOLIC PANEL
AST: 14 U/L (ref 0–37)
CO2: 23 mEq/L (ref 19–32)
Calcium: 9.6 mg/dL (ref 8.4–10.5)
Creatinine, Ser: 0.49 mg/dL — ABNORMAL LOW (ref 0.50–1.10)
GFR calc Af Amer: >90 mL/min (ref >90)
GFR calc non Af Amer: >90 mL/min (ref >90)
Glucose, Bld: 163 mg/dL — ABNORMAL HIGH (ref 70–99)

## 2011-11-02 LAB — DIFFERENTIAL
Basophils Absolute: 0 10*3/uL (ref 0.0–0.1)
Eosinophils Absolute: 0.2 10*3/uL (ref 0.0–0.7)
Eosinophils Relative: 4 % (ref 0–5)
Lymphocytes Relative: 36 % (ref 12–46)
Monocytes Absolute: 0.4 10*3/uL (ref 0.1–1.0)

## 2011-11-02 LAB — LACTIC ACID, PLASMA: Lactic Acid, Venous: 1.5 mmol/L (ref 0.5–2.2)

## 2011-11-02 MED ORDER — ACETAMINOPHEN 325 MG PO TABS
650.0000 mg | ORAL_TABLET | Freq: Once | ORAL | Status: AC
  Administered 2011-11-02: 650 mg via ORAL
  Filled 2011-11-02: qty 2

## 2011-11-02 MED ORDER — ONDANSETRON HCL 4 MG/2ML IJ SOLN
4.0000 mg | Freq: Once | INTRAMUSCULAR | Status: AC
  Administered 2011-11-02: 4 mg via INTRAVENOUS
  Filled 2011-11-02: qty 2

## 2011-11-02 MED ORDER — SODIUM CHLORIDE 0.9 % IV SOLN
Freq: Once | INTRAVENOUS | Status: AC
  Administered 2011-11-02: 13:00:00 via INTRAVENOUS

## 2011-11-02 NOTE — ED Notes (Signed)
ptar here to take back to [facility]

## 2011-11-02 NOTE — ED Notes (Signed)
Waiting for ptar to transport back to [facility] on [address]

## 2011-11-02 NOTE — ED Notes (Signed)
Per EMS pt from [facility]- staff report pt vomited x 1 today. Pt being treated for c-diff, on antibiotics for 7 days. Denies pain. VSS. CBG 152. PICC Line to left arm. Alert and oriented per norm.

## 2011-11-02 NOTE — ED Notes (Signed)
Pt c/o a headache.  Wants med for the same

## 2011-11-02 NOTE — Discharge Instructions (Signed)
Continue using Zofran as needed for nausea. Return if symptoms worsen.  Abdominal Pain Abdominal pain can be caused by many things. Your caregiver decides the seriousness of your pain by an examination and possibly blood tests and X-rays. Many cases can be observed and treated at home. Most abdominal pain is not caused by a disease and will probably improve without treatment. However, in many cases, more time must pass before a clear cause of the pain can be found. Before that point, it may not be known if you need more testing, or if hospitalization or surgery is needed. HOME CARE INSTRUCTIONS   Do not take laxatives unless directed by your caregiver.   Take pain medicine only as directed by your caregiver.   Only take over-the-counter or prescription medicines for pain, discomfort, or fever as directed by your caregiver.   Try a clear liquid diet (broth, tea, or water) for as long as directed by your caregiver. Slowly move to a bland diet as tolerated.  SEEK IMMEDIATE MEDICAL CARE IF:   The pain does not go away.   You have a fever.   You keep throwing up (vomiting).   The pain is felt only in portions of the abdomen. Pain in the right side could possibly be appendicitis. In an adult, pain in the left lower portion of the abdomen could be colitis or diverticulitis.   You pass bloody or black tarry stools.  MAKE SURE YOU:   Understand these instructions.   Will watch your condition.   Will get help right away if you are not doing well or get worse.  Document Released: 03/25/2005 Document Revised: 06/04/2011 Document Reviewed: 02/01/2008 [facility] Patient Information 2012 [place], [place].  Nausea and Vomiting Nausea is a sick feeling that often comes before throwing up (vomiting). Vomiting is a reflex where stomach contents come out of your mouth. Vomiting can cause severe loss of body fluids (dehydration). Children and elderly adults can become dehydrated quickly, especially if  they also have diarrhea. Nausea and vomiting are symptoms of a condition or disease. It is important to find the cause of your symptoms. CAUSES   Direct irritation of the stomach lining. This irritation can result from increased acid production (gastroesophageal reflux disease), infection, food poisoning, taking certain medicines (such as nonsteroidal anti-inflammatory drugs), alcohol use, or tobacco use.   Signals from the brain.These signals could be caused by a headache, heat exposure, an inner ear disturbance, increased pressure in the brain from injury, infection, a tumor, or a concussion, pain, emotional stimulus, or metabolic problems.   An obstruction in the gastrointestinal tract (bowel obstruction).   Illnesses such as diabetes, hepatitis, gallbladder problems, appendicitis, kidney problems, cancer, sepsis, atypical symptoms of a heart attack, or eating disorders.   Medical treatments such as chemotherapy and radiation.   Receiving medicine that makes you sleep (general anesthetic) during surgery.  DIAGNOSIS Your caregiver may ask for tests to be done if the problems do not improve after a few days. Tests may also be done if symptoms are severe or if the reason for the nausea and vomiting is not clear. Tests may include:  Urine tests.   Blood tests.   Stool tests.   Cultures (to look for evidence of infection).   X-rays or other imaging studies.  Test results can help your caregiver make decisions about treatment or the need for additional tests. TREATMENT You need to stay well hydrated. Drink frequently but in small amounts.You may wish to drink water, sports  drinks, clear broth, or eat frozen ice pops or gelatin dessert to help stay hydrated.When you eat, eating slowly may help prevent nausea.There are also some antinausea medicines that may help prevent nausea. HOME CARE INSTRUCTIONS   Take all medicine as directed by your caregiver.   If you do not have an appetite,  do not force yourself to eat. However, you must continue to drink fluids.   If you have an appetite, eat a normal diet unless your caregiver tells you differently.   Eat a variety of complex carbohydrates (rice, wheat, potatoes, bread), lean meats, yogurt, fruits, and vegetables.   Avoid high-fat foods because they are more difficult to digest.   Drink enough water and fluids to keep your urine clear or pale yellow.   If you are dehydrated, ask your caregiver for specific rehydration instructions. Signs of dehydration may include:   Severe thirst.   Dry lips and mouth.   Dizziness.   Dark urine.   Decreasing urine frequency and amount.   Confusion.   Rapid breathing or pulse.  SEEK IMMEDIATE MEDICAL CARE IF:   You have blood or brown flecks (like coffee grounds) in your vomit.   You have black or bloody stools.   You have a severe headache or stiff neck.   You are confused.   You have severe abdominal pain.   You have chest pain or trouble breathing.   You do not urinate at least once every 8 hours.   You develop cold or clammy skin.   You continue to vomit for longer than 24 to 48 hours.   You have a fever.  MAKE SURE YOU:   Understand these instructions.   Will watch your condition.   Will get help right away if you are not doing well or get worse.  Document Released: 06/15/2005 Document Revised: 06/04/2011 Document Reviewed: 11/12/2010 [facility] Patient Information 2012 [place], [place].

## 2011-11-02 NOTE — ED Notes (Signed)
Report called to [facility].  ptar called to transport pt back to [facility]

## 2011-11-02 NOTE — ED Provider Notes (Signed)
History     CSN: [id number]  Arrival date & time 11/02/11  1145   First MD Initiated Contact with Patient 11/02/11 1202      Chief Complaint  Patient presents with  . Emesis    (Consider location/radiation/quality/duration/timing/severity/associated sxs/prior treatment) Patient is a 78 y.o. female presenting with vomiting. The history is provided by the nursing home. The history is limited by the condition of the patient (poor historian).  Emesis   She reportedly vomited earlier today. She is being treated for Clostridium difficile infection with metronidazole. She initially stated she was not having any abdominal pain but then stated she was having abdominal pain. She states she no longer feels nauseated. She has not been running a fever that anyone has been aware of.  Past Medical History  Diagnosis Date  . Hypertension   . Reflux   . Right elbow pain     OTIF  . Dementia   . Depression   . Osteoarthritis   . Pancreatitis 11/2007    HOP  . Angiomyolipoma of kidney     right  . Ulcerative esophagitis 12/05/2007    hx elevated gastrin, severe on EGD by Dr Jena Gauss , h pylori negative  . Hiatal hernia   . S/P colonoscopy 2009    pt reports normal by Dr Lovell Sheehan  . Diabetes mellitus   . Anemia   . Hyponatremia   . Cellulitis   . Esophagitis   . Peptic ulcer   . Insomnia     Past Surgical History  Procedure Date  . Orif right hip 1999    [facility]  . Umbilical hernia repair 32 months old    [place]  . Esophagogastroduodenoscopy 01/28/2011    Procedure: ESOPHAGOGASTRODUODENOSCOPY (EGD);  Surgeon: Arlyce Harman, MD;  [facility]: [facility];  Service: Endoscopy;  Laterality: N/A;  . Colonoscopy 01/28/2011    Procedure: COLONOSCOPY;  Surgeon: Arlyce Harman, MD;  [facility]: [facility];  Service: Endoscopy;  Laterality: N/A;  . Laparotomy 02/04/2011    Procedure: EXPLORATORY LAPAROTOMY;  Surgeon: Fabio Bering;  Location: [facility];  Service: General;  Laterality: N/A;  .  Laparotomy 05/07/2011    Procedure: EXPLORATORY LAPAROTOMY;  Surgeon: Ardeth Sportsman, MD;  Location: [facility] OR;  Service: General;  Laterality: N/A;  lysis of adhesions  . Gastrostomy 05/07/2011    Procedure: GASTROSTOMY;  Surgeon: Ardeth Sportsman, MD;  Location: [facility] OR;  Service: General;  Laterality: N/A;  g-tube / j -tube placement  . Gastrectomy 05/07/2011    Procedure: GASTRECTOMY;  Surgeon: Ardeth Sportsman, MD;  Location: [facility] OR;  Service: General;  Laterality: N/A;  Partial gastrectomy  . Cholecystectomy 05/07/2011    Procedure: CHOLECYSTECTOMY;  Surgeon: Ardeth Sportsman, MD;  Location: [facility] OR;  Service: General;  Laterality: N/A;  open  . Tee without cardioversion 10/21/2011    Procedure: TRANSESOPHAGEAL ECHOCARDIOGRAM (TEE);  Surgeon: Wendall Stade, MD;  Location: [facility] ENDOSCOPY;  Service: Cardiovascular;  Laterality: N/A;    Family History  Problem Relation Age of Onset  . Cancer Mother     pelvic     History  Substance Use Topics  . Smoking status: Never Smoker   . Smokeless tobacco: Current User    Types: Chew  . Alcohol Use: No     Hx of Alcohol dependecy     OB History    Grav Para Term Preterm Abortions TAB SAB Ect Mult Living  Review of Systems  Unable to perform ROS: Dementia  Gastrointestinal: Positive for vomiting.    Allergies  Other and Penicillins  Home Medications   Current Outpatient Rx  Name Route Sig Dispense Refill  . ACETAMINOPHEN 500 MG PO TABS Per Tube Place 1 tablet (500 mg total) into feeding tube daily as needed. For pain 30 tablet   . AMLODIPINE BESYLATE 10 MG PO TABS Per Tube Place 1 tablet (10 mg total) into feeding tube daily.    Marland Kitchen BUTALBITAL-APAP-CAFFEINE 50-325-40 MG PO TABS Oral Take 2 tablets by mouth every 8 (eight) hours as needed. For headache    . CLONIDINE HCL 0.2 MG/24HR TD PTWK Transdermal Place 1 patch (0.2 mg total) onto the skin once a week. 4 patch   . CLOTRIMAZOLE 1 % VA CREA Vaginal Place 1 Applicatorful  vaginally 2 (two) times daily. 45 g   . DARIFENACIN HYDROBROMIDE ER 15 MG PO TB24 Oral Take 15 mg by mouth daily.     Marland Kitchen CEFTRIAXONE 2 G/50 ML IVPB MIXTURE Intravenous Inject 2 g into the vein daily. X 4 weeks, follow-up with Dr. Daiva Eves (ID) prior to stopping it    . ENOXAPARIN SODIUM 80 MG/0.8ML [place] SOLN Subcutaneous Inject 0.8 mLs (80 mg total) into the skin every 12 (twelve) hours. 22.4 Syringe   . ERGOCALCIFEROL 50000 UNITS PO CAPS Oral Take 50,000 Units by mouth once a week.    . FENTANYL 25 MCG/HR TD PT72 Transdermal Place 1 patch (25 mcg total) onto the skin every 3 (three) days. 5 patch 0  . INSULIN ASPART 100 UNIT/ML [place] SOLN Subcutaneous Inject 0-15 Units into the skin every 4 (four) hours. CBG 70 - 120: 0 units: CBG 121 - 150: 2 units; CBG 151 - 200: 3 units; CBG 201 - 250: 5 units; CBG 251 - 300: 8 units;CBG 301 - 350: 11 units; CBG 351 - 400: 15 units; CBG > 400 : 15 units and Call MD 1 vial   . METOPROLOL TARTRATE 50 MG PO TABS Per Tube Place 1 tablet (50 mg total) into feeding tube 2 (two) times daily.    Marland Kitchen METRONIDAZOLE 500 MG PO TABS Oral Take 1 tablet (500 mg total) by mouth every 8 (eight) hours. X 4 weeks, please donot stop prior to Dr. Daiva Eves (ID) follow-up    . ONDANSETRON HCL 4 MG PO TABS Per Tube Place 1 tablet (4 mg total) into feeding tube every 8 (eight) hours as needed. For nausea 20 tablet   . OXYCODONE HCL 5 MG PO TABS Per Tube Place 1 tablet (5 mg total) into feeding tube every 4 (four) hours as needed. 30 tablet   . PANTOPRAZOLE SODIUM 40 MG PO TBEC Oral Take 1 tablet (40 mg total) by mouth daily.    Marland Kitchen PRESCRIPTION MEDICATION Intravenous Inject into the vein continuous. Home TPN.  Patient gets supply from [facility] [phone]).    . VANCOMYCIN 1250 MG IVPB Intravenous Inject 1,250 mg into the vein every 12 (twelve) hours. X 4 weeks, please donot stop prior to Dr. Daiva Eves (ID) follow-up    . TRAZODONE HCL 50 MG PO TABS Per Tube Place 0.5-1 tablets (25-50 mg  total) into feeding tube at bedtime as needed. For sleep      BP 158/82  Pulse 115  Temp(Src) 99.2 F (37.3 C) (Oral)  Resp 14  SpO2 96%  Physical Exam  Nursing note and vitals reviewed.  78 year old female who is awake and alert and  in no acute distress. Vital signs are significant for moderate tachycardia with heart rate 115, and mild hypertension with blood pressure 158/82. Oxygen saturation is 96% which is normal. Head is normocephalic and atraumatic. PERRLA, EOMI. Neck is nontender and supple. Back is nontender. Lungs are clear without rales, wheezes, or rhonchi. Heart has a regular rate and rhythm which is tachycardic. No murmurs are heard. Abdomen is soft, flat, with moderate upper abdominal tenderness which is poorly localized. There is no rebound or guarding. There is no hepatosplenomegaly. Peristalsis is diminished. Extremities have no cyanosis or edema, full passive range of motion is present. Skin is warm and dry without rash. Neurologic: She is awake and alert. Cranial nerves are intact. There are no focal motor or sensory deficits.  ED Course  Procedures (including critical care time)  Results for orders placed during the hospital encounter of 11/02/11  CBC      Component Value Range   WBC 5.7  4.0 - 10.5 (K/uL)   RBC 4.23  3.87 - 5.11 (MIL/uL)   Hemoglobin 11.2 (*) 12.0 - 15.0 (g/dL)   HCT 96.2 (*) 95.2 - 46.0 (%)   MCV 80.1  78.0 - 100.0 (fL)   MCH 26.5  26.0 - 34.0 (pg)   MCHC 33.0  30.0 - 36.0 (g/dL)   RDW 84.1 (*) 32.4 - 15.5 (%)   Platelets 256  150 - 400 (K/uL)  DIFFERENTIAL      Component Value Range   Neutrophils Relative 54  43 - 77 (%)   Neutro Abs 3.1  1.7 - 7.7 (K/uL)   Lymphocytes Relative 36  12 - 46 (%)   Lymphs Abs 2.0  0.7 - 4.0 (K/uL)   Monocytes Relative 6  3 - 12 (%)   Monocytes Absolute 0.4  0.1 - 1.0 (K/uL)   Eosinophils Relative 4  0 - 5 (%)   Eosinophils Absolute 0.2  0.0 - 0.7 (K/uL)   Basophils Relative 0  0 - 1 (%)   Basophils Absolute  0.0  0.0 - 0.1 (K/uL)  COMPREHENSIVE METABOLIC PANEL      Component Value Range   Sodium 134 (*) 135 - 145 (mEq/L)   Potassium 4.0  3.5 - 5.1 (mEq/L)   Chloride 100  96 - 112 (mEq/L)   CO2 23  19 - 32 (mEq/L)   Glucose, Bld 163 (*) 70 - 99 (mg/dL)   BUN 14  6 - 23 (mg/dL)   Creatinine, Ser 4.01 (*) 0.50 - 1.10 (mg/dL)   Calcium 9.6  8.4 - 02.7 (mg/dL)   Total Protein 7.4  6.0 - 8.3 (g/dL)   Albumin 2.9 (*) 3.5 - 5.2 (g/dL)   AST 14  0 - 37 (U/L)   ALT 8  0 - 35 (U/L)   Alkaline Phosphatase 83  39 - 117 (U/L)   Total Bilirubin 0.2 (*) 0.3 - 1.2 (mg/dL)   GFR calc non Af Amer >90  >90 (mL/min)   GFR calc Af Amer >90  >90 (mL/min)  LIPASE, BLOOD      Component Value Range   Lipase 20  11 - 59 (U/L)  LACTIC ACID, PLASMA      Component Value Range   Lactic Acid, Venous 1.5  0.5 - 2.2 (mmol/L)  URINALYSIS, ROUTINE W REFLEX MICROSCOPIC      Component Value Range   Color, Urine YELLOW  YELLOW    APPearance CLEAR  CLEAR    Specific Gravity, Urine 1.020  1.005 - 1.030  pH 6.0  5.0 - 8.0    Glucose, UA NEGATIVE  NEGATIVE (mg/dL)   Hgb urine dipstick NEGATIVE  NEGATIVE    Bilirubin Urine NEGATIVE  NEGATIVE    Ketones, ur NEGATIVE  NEGATIVE (mg/dL)   Protein, ur NEGATIVE  NEGATIVE (mg/dL)   Urobilinogen, UA 0.2  0.0 - 1.0 (mg/dL)   Nitrite NEGATIVE  NEGATIVE    Leukocytes, UA SMALL (*) NEGATIVE   URINE MICROSCOPIC-ADD ON      Component Value Range   Squamous Epithelial / LPF RARE  RARE    WBC, UA 3-6  <3 (WBC/hpf)   RBC / HPF 0-2  <3 (RBC/hpf)   Bacteria, UA RARE  RARE    She feels much better after IV hydration and IV ondansetron. When asked, she states she would feel comfortable going back to her nursing home. WBC remains normal without any left shift. There is no evidence of any serious abdominal pathology at this time. Ondansetron is one of her current medications and she is to continue using that as needed and return if her symptoms worsen.   1. Nausea and vomiting   2.  Abdominal pain       MDM  Old records are reviewed and she was discharged from the hospital 6 days ago after being hospitalized for a urinary tract infection and sepsis. She had a CT scan of her abdomen on April 16. I will try and avoid repeat CT scan. Laboratory workup will be initiated. She had a metabolic acidosis at her last hospital medicine so lactic acid will be rechecked even though she shows no signs of SIRS.        Dione Booze, MD 11/02/11 1525

## 2011-11-06 ENCOUNTER — Encounter (INDEPENDENT_AMBULATORY_CARE_PROVIDER_SITE_OTHER): Payer: Self-pay | Admitting: Surgery

## 2011-11-06 ENCOUNTER — Ambulatory Visit (INDEPENDENT_AMBULATORY_CARE_PROVIDER_SITE_OTHER): Payer: Medicare HMO | Admitting: Surgery

## 2011-11-06 VITALS — BP 110/77 | HR 128 | Temp 97.3°F | Ht 67.0 in | Wt 163.8 lb

## 2011-11-06 DIAGNOSIS — K316 Fistula of stomach and duodenum: Secondary | ICD-10-CM

## 2011-11-06 NOTE — Progress Notes (Signed)
[facility]  Ovidio Kin, MD,  FACS [address] [place]  [address] [place], [place]    [address] Phone:  [phone] FAX:  [phone]   Re:   [name] DOB:   08/31/1931 MRN:   [id number]  URGENT OFFICE  ASSESSMENT AND PLAN: 1.  Abdominal wound draining  -superficial wound which is fairly clean  2.  EXPLORATORY LAPAROTOMY, GASTROSTOMY, GASTRECTOMY (BII), CHOLECYSTECTOMY - 05/07/2011 - S. Gross.  3.  Diabetes Mellitus. 4.  Right upper extremity DVT - on Lovenox 5.  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 Earl Gala 10/22/2011)  Odd office visit.  The patient comes to the Urgent Office, but has no one with her.  She is not able to give a history or list what is wrong with her.  She has a left arm PICC line in place.  Unknown reason for the PICC line.  She has a gastrostomy tube in place.  I am unsure if it is being used.  And there is no one with the patient to advocate on her behalf. I am at a loss of what to do.  She is a very complex patient. I think she is best brought back to Dr. Gordy Savers office (with someone who knows what is going on with her) and review a plan going forward.  There are notes in the chart about "palliative care".  I do not know where that is.  HISTORY OF PRESENT ILLNESS: Chief Complaint  Patient presents with  . Follow-up    reck abd inc..leaking fluid    [name] is a 78 y.o. (DOB: Apr 09, 1932)  AA female who is a patient of [name], MD, MD and comes to me today for draining abdominal wound.  She has a complex history of having a EXPLORATORY LAPAROTOMY, GASTROSTOMY, GASTRECTOMY (BII), CHOLECYSTECTOMY - 05/07/2011 - S. Gross.   She was recently hospitalized from 10/12/2011 - 10/27/2011.  She was seen in the hospital by [facility]. [facility] on 10/22/2011.  It is unclear why she is in the urgent office today.  The patient cannot provide a history.  Her incisions look good.  I am not sure of  the status of her G tube.  PHYSICAL EXAM: BP 110/77  Pulse 128  Temp(Src) 97.3 F (36.3 C) (Temporal)  Ht 5\' 7"  (1.702 m)  Wt 163 lb 12.8 oz (74.299 kg)  BMI 25.65 kg/m2  SpO2 96%  Lungs:  Clear Heart:  RRR Abdomen:  LUQ gastrostomy tube, clamped.  Pin hole draining sinus from midline incision.  Probed with hemostat.  No obvious suture material. Extremity:  PICC in left upper arm  DATA REVIEWED: Hospital records.  Ovidio Kin, MD, FACS Office:  [phone]

## 2011-11-13 ENCOUNTER — Inpatient Hospital Stay: Payer: Medicare HMO | Admitting: Internal Medicine

## 2011-11-16 ENCOUNTER — Telehealth: Payer: Self-pay | Admitting: *Deleted

## 2011-11-16 NOTE — Telephone Encounter (Signed)
I called the number to remind her of tomorrow's appt. It has been d/c

## 2011-11-17 ENCOUNTER — Inpatient Hospital Stay: Payer: Medicare HMO | Admitting: Infectious Disease

## 2011-11-21 ENCOUNTER — Emergency Department (HOSPITAL_COMMUNITY): Payer: Medicare HMO

## 2011-11-21 ENCOUNTER — Encounter (HOSPITAL_COMMUNITY): Payer: Self-pay

## 2011-11-21 ENCOUNTER — Emergency Department (HOSPITAL_COMMUNITY)
Admission: EM | Admit: 2011-11-21 | Discharge: 2011-11-21 | Disposition: A | Discharge status: Home / Self Care | Payer: Medicare HMO | Attending: Emergency Medicine | Admitting: Emergency Medicine

## 2011-11-21 DIAGNOSIS — Y849 Medical procedure, unspecified as the cause of abnormal reaction of the patient, or of later complication, without mention of misadventure at the time of the procedure: Secondary | ICD-10-CM | POA: Insufficient documentation

## 2011-11-21 DIAGNOSIS — T82898A Other specified complication of vascular prosthetic devices, implants and grafts, initial encounter: Secondary | ICD-10-CM | POA: Insufficient documentation

## 2011-11-21 DIAGNOSIS — E119 Type 2 diabetes mellitus without complications: Secondary | ICD-10-CM | POA: Insufficient documentation

## 2011-11-21 DIAGNOSIS — Z794 Long term (current) use of insulin: Secondary | ICD-10-CM | POA: Insufficient documentation

## 2011-11-21 DIAGNOSIS — I10 Essential (primary) hypertension: Secondary | ICD-10-CM | POA: Insufficient documentation

## 2011-11-21 MED ORDER — ALTEPLASE 2 MG IJ SOLR
2.0000 mg | INTRAMUSCULAR | Status: DC
  Filled 2011-11-21: qty 2

## 2011-11-21 NOTE — ED Notes (Signed)
IV team paged and will be at the bedside shortly

## 2011-11-21 NOTE — ED Provider Notes (Signed)
History     CSN: [id number]  Arrival date & time 11/21/11  1728   First MD Initiated Contact with Patient 11/21/11 1731      Chief Complaint  Patient presents with  . Vascular Access Problem    (Consider location/radiation/quality/duration/timing/severity/associated sxs/prior treatment) HPI Comments: Sent from nursing home because pt's PICC line which is used for TPN is not working today.  Last functional 5 hours ago.  Pt otherwise doing well and says she wants to go home.  The history is provided by the patient and the EMS personnel.    Past Medical History  Diagnosis Date  . Hypertension   . Reflux   . Right elbow pain     OTIF  . Dementia   . Depression   . Osteoarthritis   . Pancreatitis 11/2007    H[place]  . Angiomyolipoma of kidney     right  . Ulcerative esophagitis 12/05/2007    hx elevated gastrin, severe on EGD by Dr Jena Gauss , h pylori negative  . Hiatal hernia   . S/P colonoscopy 2009    pt reports normal by Dr Lovell Sheehan  . Diabetes mellitus   . Anemia   . Hyponatremia   . Cellulitis   . Esophagitis   . Peptic ulcer   . Insomnia     Past Surgical History  Procedure Date  . Orif right hip 1999    [facility]  . Umbilical hernia repair 80 months old    [place]  . Esophagogastroduodenoscopy 01/28/2011    Procedure: ESOPHAGOGASTRODUODENOSCOPY (EGD);  Surgeon: Arlyce Harman, MD;  [facility];  Service: Endoscopy;  Laterality: N/A;  . Colonoscopy 01/28/2011    Procedure: COLONOSCOPY;  Surgeon: Arlyce Harman, MD;  [facility];  Service: Endoscopy;  Laterality: N/A;  . Laparotomy 02/04/2011    Procedure: EXPLORATORY LAPAROTOMY;  Surgeon: Fabio Bering;  Location: [facility];  Service: General;  Laterality: N/A;  . Laparotomy 05/07/2011    Procedure: EXPLORATORY LAPAROTOMY;  Surgeon: Ardeth Sportsman, MD;  Location: [facility] OR;  Service: General;  Laterality: N/A;  lysis of adhesions  . Gastrostomy 05/07/2011    Procedure: GASTROSTOMY;  Surgeon: Ardeth Sportsman, MD;  Location: [facility] OR;  Service: General;  Laterality: N/A;  g-tube / j -tube placement  . Gastrectomy 05/07/2011    Procedure: GASTRECTOMY;  Surgeon: Ardeth Sportsman, MD;  Location: [facility] OR;  Service: General;  Laterality: N/A;  Partial gastrectomy  . Cholecystectomy 05/07/2011    Procedure: CHOLECYSTECTOMY;  Surgeon: Ardeth Sportsman, MD;  Location: [facility] OR;  Service: General;  Laterality: N/A;  open  . Tee without cardioversion 10/21/2011    Procedure: TRANSESOPHAGEAL ECHOCARDIOGRAM (TEE);  Surgeon: Wendall Stade, MD;  Location: [facility] ENDOSCOPY;  Service: Cardiovascular;  Laterality: N/A;    Family History  Problem Relation Age of Onset  . Cancer Mother     pelvic     History  Substance Use Topics  . Smoking status: Never Smoker   . Smokeless tobacco: Current User    Types: Chew  . Alcohol Use: No     Hx of Alcohol dependecy     OB History    Grav Para Term Preterm Abortions TAB SAB Ect Mult Living                  Review of Systems  Constitutional: Negative for activity change.  HENT: Negative for congestion.   Eyes: Negative for visual disturbance.  Respiratory: Negative for  chest tightness and shortness of breath.   Cardiovascular: Negative for chest pain and leg swelling.  Gastrointestinal: Negative for abdominal pain.  Genitourinary: Negative for dysuria.  Skin: Negative for rash.  Neurological: Negative for syncope.  Psychiatric/Behavioral: Negative for behavioral problems.    Allergies  Other and Penicillins  Home Medications   Current Outpatient Rx  Name Route Sig Dispense Refill  . ACETAMINOPHEN 500 MG PO TABS Feeding Tube 500 mg by Feeding Tube route daily as needed. For pain    . CLINIMIX E/DEXTROSE (5/15) 5 % IV SOLN Intravenous Inject 93 mL/hr into the vein continuous.    . AMLODIPINE BESYLATE 10 MG PO TABS Feeding Tube 10 mg by Feeding Tube route daily.    Marland Kitchen BUTALBITAL-APAP-CAFFEINE 50-325-40 MG PO TABS Feeding Tube 2 tablets by Feeding Tube route  every 8 (eight) hours as needed. For headache    . CLONIDINE HCL 0.2 MG/24HR TD PTWK Transdermal Place 1 patch (0.2 mg total) onto the skin once a week. 4 patch   . DARIFENACIN HYDROBROMIDE ER 15 MG PO TB24 Feeding Tube 15 mg by Feeding Tube route daily.     Marland Kitchen CEFTRIAXONE 2 G/50 ML IVPB MIXTURE Intravenous Inject 2 g into the vein daily. X 4 weeks, follow-up with Dr. Daiva Eves (ID) prior to stopping it    . ENOXAPARIN SODIUM 80 MG/0.8ML [place] SOLN Subcutaneous Inject 0.8 mLs (80 mg total) into the skin every 12 (twelve) hours. 22.4 Syringe   . ERGOCALCIFEROL 50000 UNITS PO CAPS Feeding Tube 50,000 Units by Feeding Tube route once a week.     Marland Kitchen FAT EMULSION IV Intravenous Inject 2 % into the vein continuous. With TPN    . FENTANYL 50 MCG/HR TD PT72 Transdermal Place 1 patch onto the skin every 3 (three) days.    . INSULIN ASPART 100 UNIT/ML [place] SOLN Subcutaneous Inject 3-5 Units into the skin 3 (three) times daily as needed. For cbg >150    . METOPROLOL TARTRATE 50 MG PO TABS Feeding Tube 50 mg by Feeding Tube route 2 (two) times daily.    Marland Kitchen METRONIDAZOLE 500 MG PO TABS Oral Take 500 mg by mouth every 8 (eight) hours.    . ONDANSETRON HCL 4 MG PO TABS Feeding Tube 4 mg by Feeding Tube route every 8 (eight) hours as needed. For nausea    . OXYCODONE HCL 5 MG PO TABS Per Tube Place 5 mg into feeding tube every 4 (four) hours as needed. For pain    . PANTOPRAZOLE SODIUM 40 MG PO TBEC Feeding Tube 40 mg by Feeding Tube route daily.    Marland Kitchen SACCHAROMYCES BOULARDII 250 MG PO CAPS Oral Take 250 mg by mouth 2 (two) times daily.    Marland Kitchen VANCOMYCIN 1000 MG IVPB Intravenous Inject 1,000 mg into the vein every evening.    . TRAZODONE HCL 50 MG PO TABS Per Tube Place 0.5-1 tablets (25-50 mg total) into feeding tube at bedtime as needed. For sleep      BP 123/94  Pulse 78  Temp(Src) 98.6 F (37 C) (Oral)  Resp 18  SpO2 98%  Physical Exam  Constitutional: She is oriented to person, place, and time. She appears  well-developed and well-nourished.  HENT:  Head: Normocephalic and atraumatic.  Eyes: Conjunctivae and EOM are normal. Pupils are equal, round, and reactive to light. No scleral icterus.  Neck: Normal range of motion. Neck supple.  Pulmonary/Chest: Effort normal. No respiratory distress.  Abdominal: Soft. She exhibits no distension and  no mass. There is no tenderness. There is no rebound and no guarding.  Musculoskeletal: Normal range of motion.       PICC left arm.  No erythema or swelling.  Neurological: She is alert and oriented to person, place, and time. She has normal reflexes. No cranial nerve deficit.  Skin: Skin is warm and dry. No rash noted.  Psychiatric: She has a normal mood and affect. Her behavior is normal. Judgment and thought content normal.    ED Course  Procedures (including critical care time)   Labs Reviewed  GLUCOSE, CAPILLARY   Dg Chest Portable 1 View  11/21/2011  *RADIOLOGY REPORT*  Clinical Data: Vascular access problem.  PORTABLE CHEST - 1 VIEW  Comparison: 10/13/2011  Findings: Low lung volumes are now seen, with mild bibasilar atelectasis versus infiltrates which are new since previous study.  Heart size is stable.  Left arm PICC line remains in place.  IMPRESSION:  Decreased lung volumes, with new bibasilar atelectasis versus infiltrates.  Original Report Authenticated By: Danae Orleans, M.D.     1. Occluded PICC line       MDM  Sent from nursing home because pt;s PICC line which is used for TPN is not working.  Pt otherwise doing well and says she wants to go home.  VSS and well appearing.  IV team came and flushed PICC.  Is working now.  Placement confirmed by CXR.  Pt ready for d/c home.        Army Chaco, MD 11/21/11 [phone]

## 2011-11-21 NOTE — ED Notes (Signed)
CBG 89 

## 2011-11-21 NOTE — Discharge Instructions (Signed)
Peripherally Inserted Central Catheter (PICC)  Home Guide  A peripherally inserted central catheter (PICC) is a long, thin, flexible tube that is inserted into a vein in the upper arm. It is a form of intravenous (IV) access. It is considered to be a "central" line because the tip of the PICC ends in a large vein in your chest. This large vein is called the superior vena cava (SVC). The PICC tip ends in the SVC because there is a lot of blood flow in the SVC. This allows medicines and IV fluids to be quickly distributed throughout the body. The PICC is inserted using a sterile technique by a specially trained nurse or physician. After the PICC is inserted, a chest X-ray is done to be sure it is in the correct place.   A PICC may be placed for different reasons, such as:   To give medicines and liquid nutrition that can only be given through a central line. Examples are:   Certain antibiotic treatments.   Chemotherapy.   Total parenteral nutrition (TPN).   To take frequent blood samples.   To give IV fluids and blood products.   If there is difficulty placing a peripheral intravenous (PIV) catheter.  If taken care of properly, a PICC can remain in place for several months. A PICC can also allow patients to go home early. Medicine and PICC care can be managed at home by a family member or home healthcare team.  RISKS AND COMPLICATIONS  Possible problems with a PICC can occasionally occur. This may include:   A clot (thrombus) forming in or at the tip of the PICC. This can cause the PICC to become clogged. A "clot-busting" medicine called tissue plasminogen activator (tPA) can be inserted into the PICC to help break up the clot.   Inflammation of the vein (phlebitis) in which the PICC is placed. Signs of inflammation may include redness, pain at the insertion site, red streaks, or being able to feel a "cord" in the vein where the PICC is located.   Infection in the PICC or at the insertion site. Signs of  infection may include fever, chills, redness, swelling, or pus drainage from the PICC insertion site.   PICC movement (malposition). The PICC tip may migrate from its original position due to excessive physical activity, forceful coughing, sneezing, or vomiting.   A break or cut in the PICC. It is important to not use scissors near the PICC.   Nerve or tendon irritation or injury during PICC insertion.  HOME CARE INSTRUCTIONS  Activity   You may bend your arm and move it freely. If your PICC is near or at the bend of your elbow, avoid activity with repeated motion at the elbow.   Avoid lifting heavy objects as instructed by your caregiver.   Avoid using a crutch with the arm on the same side as your PICC. You may need to use a walker.  PICC Dressing   Keep your PICC bandage (dressing) clean and dry to prevent infection.   Ask your caregiver when you may shower. Ask your caregiver to teach you how to wrap the PICC when you do take a shower.   Do not bathe, swim, or use hot tubs when you have a PICC.   Change the PICC dressing as instructed by your caregiver.   Change your PICC dressing if it becomes loose or wet.  General PICC Care   Check the PICC insertion site daily for leakage, redness, swelling,   or pain.   Flush the PICC as directed by your caregiver. Let your caregiver know right away if the PICC is difficult to flush or does not flush. Do not use force to flush the PICC.   Do not use a syringe that is less than 10 mLs to flush the PICC.   Never pull or tug on the PICC.   Avoid blood pressure checks on the arm with the PICC.   Keep your PICC identification card with you at all times.   Do not take the PICC out yourself. Only a trained clinical professional should remove the PICC.  SEEK IMMEDIATE MEDICAL CARE IF:   Your PICC is accidently pulled all the way out. If this happens, cover the insertion site with a bandage or gauze dressing. Do not throw the PICC away. Your caregiver will need to  inspect it.   Your PICC was tugged or pulled and has partially come out. Do not  push the PICC back in.   There is any type of drainage, redness, or swelling where the PICC enters the skin.   You cannot flush the PICC, it is difficult to flush, or the PICC leaks around the insertion site when it is flushed.   You hear a "flushing" sound when the PICC is flushed.   You have pain, discomfort, or numbness in your arm, shoulder, or jaw on the same side as the PICC .   You feel your heart "racing" or skipping beats.   You notice a hole or tear in the PICC.   You develop chills or a fever.  MAKE SURE YOU:    Understand these instructions.   Will watch your condition.   Will get help right away if you are not doing well or get worse.  Document Released: 12/20/2002 Document Revised: 06/04/2011 Document Reviewed: 10/20/2010  Exit[employer] Patient Information 2012 [employer].

## 2011-11-21 NOTE — ED Notes (Signed)
Pt  From [facility], PICC line clogged on tpa, and gets antibiotics

## 2011-11-21 NOTE — ED Notes (Signed)
Portable chest x-ray at the bedside.  

## 2011-11-21 NOTE — ED Notes (Signed)
IV nurse at the patient bedside,

## 2011-11-21 NOTE — ED Notes (Signed)
Per IV team, Cathflo not given before discharge.  MD notified.

## 2011-11-21 NOTE — ED Notes (Signed)
Report called to [facility].  Patient to go back via [facility].

## 2011-11-22 NOTE — ED Provider Notes (Signed)
I saw and evaluated the patient, reviewed the resident's note and I agree with the findings and plan.   .Face to face Exam:  General:  Awake HEENT:  Atraumatic Resp:  Normal effort Abd:  Nondistended Neuro:No focal weakness Lymph: No adenopathy   Nelia Shi, MD 11/22/11 1007

## 2011-11-26 ENCOUNTER — Encounter (INDEPENDENT_AMBULATORY_CARE_PROVIDER_SITE_OTHER): Payer: Self-pay | Admitting: Surgery

## 2011-11-26 ENCOUNTER — Ambulatory Visit (INDEPENDENT_AMBULATORY_CARE_PROVIDER_SITE_OTHER): Payer: Medicare HMO | Admitting: Surgery

## 2011-11-26 VITALS — BP 110/72 | HR 116 | Temp 98.4°F | Resp 16 | Ht 66.5 in

## 2011-11-26 DIAGNOSIS — A048 Other specified bacterial intestinal infections: Secondary | ICD-10-CM

## 2011-11-26 DIAGNOSIS — K316 Fistula of stomach and duodenum: Secondary | ICD-10-CM

## 2011-11-26 DIAGNOSIS — B9681 Helicobacter pylori [H. pylori] as the cause of diseases classified elsewhere: Secondary | ICD-10-CM

## 2011-11-26 DIAGNOSIS — K259 Gastric ulcer, unspecified as acute or chronic, without hemorrhage or perforation: Secondary | ICD-10-CM

## 2011-11-26 NOTE — Progress Notes (Signed)
Subjective:     Patient ID: [name], female   DOB: August 27, 1931, 78 y.o.   MRN: [id number]  Wound Check    [name]  July 17, 1931 [id number]  Patient Care Team: Kerri Perches, MD as PCP - General Fabio Bering, MD as Consulting Physician (General Surgery) West Bali, MD as Consulting Physician (Gastroenterology) Ardeth Sportsman, MD as Consulting Physician (General Surgery) Maxwell Caul, MD as Attending Physician (Internal Medicine)  This patient is a 78 y.o.female who presents today for surgical evaluation.   Diagnosis: Perforation of prepyloric ulcer  Procedure: Exploratory laparotomy. Cholecystectomy. Antrectomy and resection of duodenal bulb. Billroth II reconstruction with loop gastrojejunostomy.  Tube gastrostomy & jejunostomy.  The patient comes today from [facility]. She comes today with her daughter. She was readmitted last month. It worsening dysphasia. Has a gastrostomy tube. His teenage dependent given her persistent gastro-ileal fistula which acts as a functional short gut. Still getting pills though.  No severe diarrhea. No nausea or vomiting. On parenteral T&A nutrition.  No fevers or chills. Minimal abdominal discomfort.  It is a struggle to get any history of the patient. She's not speaking much and tends to mumble. The daughter is trying to understand things but confesses that she's not had as much interaction with physicians.  She was readmitted to the hospital last month. There was concern that she was deteriorating. She was transitioned a more palliative care. Now the patient is more interested in surgery. A month ago everyone was against it.  Patient Active Problem List  Diagnoses  . DIABETES MELLITUS, TYPE II  . HYPERLIPIDEMIA  . Obesity, unspecified  . DEPRESSION  . HYPERTENSION  . DIVERTICULOSIS OF COLON  . ACUTE CYSTITIS  . HEMATOMA  . Anemia  . Hyponatremia  . Perforated prepyloric gastric ulcer s/p antrectomy with Billroth  II reconstruction  . Ulcer, colon  . Acute renal failure  . Confusion  . Metabolic acidosis  . Esophagitis, erosive  . 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 Wound healing, delayed  . Difficult intravenous access  . C. difficile colitis- recent/still under treatment  . Sepsis  . Hypotension  . UTI (lower urinary tract infection)  . Bacteremia due to gram positive rods and cocci  . Acute respiratory failure with hypoxia  . Septic shock  . Acute encephalopathy  . Swelling of limb, right  . PICC line infection    Past Medical History  Diagnosis Date  . Hypertension   . Reflux   . Right elbow pain     OTIF  . Dementia   . Depression   . Osteoarthritis   . Pancreatitis 11/2007    HOP  . Angiomyolipoma of kidney     right  . Ulcerative esophagitis 12/05/2007    hx elevated gastrin, severe on EGD by Dr Jena Gauss , h pylori negative  . Hiatal hernia   . S/P colonoscopy 2009    pt reports normal by Dr Lovell Sheehan  . Diabetes mellitus   . Anemia   . Hyponatremia   . Cellulitis   . Esophagitis   . Peptic ulcer   . Insomnia     Past Surgical History  Procedure Date  . Orif right hip 1999    [facility]  . Umbilical hernia repair 65 months old    [place]  . Esophagogastroduodenoscopy 01/28/2011    Procedure: ESOPHAGOGASTRODUODENOSCOPY (EGD);  Surgeon: Arlyce Harman, MD;  Location: [facility]O [facility]ITE;  Service: Endoscopy;  Laterality: N/A;  . Colonoscopy 01/28/2011    Procedure: COLONOSCOPY;  Surgeon: Arlyce Harman, MD;  Location: [facility];  Service: Endoscopy;  Laterality: N/A;  . Laparotomy 02/04/2011    Procedure: EXPLORATORY LAPAROTOMY;  Surgeon: Fabio Bering;  Location: [facility] ORS;  Service: General;  Laterality: N/A;  . Laparotomy 05/07/2011    Procedure: EXPLORATORY LAPAROTOMY;  Surgeon: Ardeth Sportsman, MD;  Location: [facility] OR;  Service: General;  Laterality: N/A;  lysis of adhesions  . Gastrostomy 05/07/2011    Procedure: GASTROSTOMY;  Surgeon: Ardeth Sportsman, MD;  Location: [facility] OR;  Service: General;  Laterality: N/A;  g-tube / j -tube placement  . Gastrectomy 05/07/2011    Procedure: GASTRECTOMY;  Surgeon: Ardeth Sportsman, MD;  Location: [facility] OR;  Service: General;  Laterality: N/A;  Partial gastrectomy  . Cholecystectomy 05/07/2011    Procedure: CHOLECYSTECTOMY;  Surgeon: Ardeth Sportsman, MD;  Location: [facility] OR;  Service: General;  Laterality: N/A;  open  . Tee without cardioversion 10/21/2011    Procedure: TRANSESOPHAGEAL ECHOCARDIOGRAM (TEE);  Surgeon: Wendall Stade, MD;  Location: [facility] ENDOSCOPY;  Service: Cardiovascular;  Laterality: N/A;    History   Social History  . Marital Status: Widowed    Spouse Name: N/A    Number of Children: 1  . Years of Education: N/A   Occupational History  . retired     Social History Main Topics  . Smoking status: Never Smoker   . Smokeless tobacco: Current User    Types: Chew  . Alcohol Use: No     Hx of Alcohol dependecy   . Drug Use: No  . Sexually Active: No   Other Topics Concern  . Not on file   Social History Narrative  . No narrative on file    Family History  Problem Relation Age of Onset  . Cancer Mother     pelvic     Current outpatient prescriptions:acetaminophen (TYLENOL) 500 MG tablet, 500 mg by Feeding Tube route daily as needed. For pain, Disp: , Rfl: ;  Amino Ac Elect-Calc in D15W (TPN SOLUTION, CLINIMIX E 5/15,) 5 % SOLN, Inject 93 mL/hr into the vein continuous., Disp: , Rfl: ;  amLODipine (NORVASC) 10 MG tablet, 10 mg by Feeding Tube route daily., Disp: , Rfl:  butalbital-acetaminophen-caffeine (FIORICET, ESGIC) 50-325-40 MG per tablet, 2 tablets by Feeding Tube route every 8 (eight) hours as needed. For headache, Disp: , Rfl: ;  cloNIDine (CATAPRES - DOSED IN MG/24 HR) 0.2 mg/24hr patch, Place 1 patch (0.2 mg total) onto the skin once a week., Disp: 4 patch, Rfl: ;  darifenacin (ENABLEX) 15 MG 24 hr tablet, 15 mg by Feeding Tube route daily. , Disp: , Rfl:  dextrose 5 %  SOLN 50 mL with cefTRIAXone 2 G SOLR 2 g, Inject 2 g into the vein daily. X 4 weeks, follow-up with Dr. Daiva Eves (ID) prior to stopping it, Disp: , Rfl: ;  enoxaparin (LOVENOX) 80 MG/0.8ML injection, Inject 0.8 mLs (80 mg total) into the skin every 12 (twelve) hours., Disp: 22.4 Syringe, Rfl: ;  ergocalciferol (VITAMIN D2) 50000 UNITS capsule, 50,000 Units by Feeding Tube route once a week. , Disp: , Rfl:  FAT EMULSION IV, Inject 2 % into the vein continuous. With TPN, Disp: , Rfl: ;  fentaNYL (DURAGESIC - DOSED MCG/HR) 50 MCG/HR, Place 1 patch onto the skin every 3 (three) days., Disp: , Rfl: ;  insulin aspart (NOVOLOG) 100 UNIT/ML injection, Inject 3-5  Units into the skin 3 (three) times daily as needed. For cbg >150, Disp: , Rfl: ;  metoprolol (LOPRESSOR) 50 MG tablet, 50 mg by Feeding Tube route 2 (two) times daily., Disp: , Rfl:  ondansetron (ZOFRAN) 4 MG tablet, 4 mg by Feeding Tube route every 8 (eight) hours as needed. For nausea, Disp: , Rfl: ;  pantoprazole (PROTONIX) 40 MG tablet, 40 mg by Feeding Tube route daily., Disp: , Rfl: ;  saccharomyces boulardii (FLORASTOR) 250 MG capsule, Take 250 mg by mouth 2 (two) times daily., Disp: , Rfl:  sodium chloride 0.9 % SOLN 250 mL with vancomycin 1000 MG SOLR 1,000 mg, Inject 1,000 mg into the vein every evening., Disp: , Rfl: ;  traZODone (DESYREL) 50 MG tablet, Place 0.5-1 tablets (25-50 mg total) into feeding tube at bedtime as needed. For sleep, Disp: , Rfl: ;  oxyCODONE (OXY IR/ROXICODONE) 5 MG immediate release tablet, Place 5 mg into feeding tube every 4 (four) hours as needed. For pain, Disp: , Rfl:   Allergies  Allergen Reactions  . Other Swelling    Pinto beans cause swelling and hives  . Penicillins Swelling    BP 110/72  Pulse 116  Temp(Src) 98.4 F (36.9 C) (Temporal)  Resp 16  Ht 5' 6.5" (1.689 m)     Review of Systems  Constitutional: Positive for fatigue. Negative for fever, chills, diaphoresis and appetite change.  HENT:  Negative for ear pain, sore throat and trouble swallowing.   Eyes: Negative for photophobia and visual disturbance.  Respiratory: Negative for cough and choking.   Cardiovascular: Negative for chest pain and palpitations.  Gastrointestinal: Negative for nausea, vomiting, abdominal pain, constipation, blood in stool, abdominal distention, anal bleeding and rectal pain.  Genitourinary: Negative for dysuria, frequency and difficulty urinating.  Musculoskeletal: Positive for myalgias and arthralgias. Negative for gait problem.  Skin: Negative for color change, pallor and rash.  Neurological: Negative for dizziness, speech difficulty, weakness and numbness.  Hematological: Negative for adenopathy.  Psychiatric/Behavioral: Negative for confusion and agitation. The patient is not nervous/anxious.        Objective:   Physical Exam  Constitutional: She is oriented to person, place, and time. She appears well-developed. She is cooperative. She does not have a sickly appearance. She does not appear ill. No distress.  HENT:  Head: Normocephalic.  Mouth/Throat: Oropharynx is clear and moist. No oropharyngeal exudate.  Eyes: Conjunctivae and EOM are normal. Pupils are equal, round, and reactive to light. No scleral icterus.  Neck: Normal range of motion. No tracheal deviation present.  Cardiovascular: Normal rate and intact distal pulses.   Pulmonary/Chest: Effort normal. No respiratory distress. She exhibits no tenderness.  Abdominal: Soft. She exhibits no distension and no mass. There is no tenderness. Hernia confirmed negative in the right inguinal area and confirmed negative in the left inguinal area.         Incisions clean with normal healing ridges.  No hernias  Gtube LUQ flushes OK  All drains out - scant granulation at openings  Genitourinary: No vaginal discharge found.  Musculoskeletal: Normal range of motion. She exhibits no tenderness.  Lymphadenopathy:       Right: No inguinal  adenopathy present.       Left: No inguinal adenopathy present.  Neurological: She is oriented to person, place, and time. No cranial nerve deficit. She exhibits normal muscle tone. Coordination normal.       Prefers sitting in wheelchair  Skin: Skin is warm and dry. No rash  noted. She is not diaphoretic.  Psychiatric: She has a normal mood and affect. Her behavior is normal.   Ct Abdomen Pelvis W Contrast  06/26/2011  *RADIOLOGY REPORT*  Clinical Data: Intra-abdominal abscess  CT ABDOMEN AND PELVIS WITH CONTRAST  Technique:  Multidetector CT imaging of the abdomen and pelvis was performed following the standard protocol during bolus administration of intravenous contrast.  Contrast: 80mL OMNIPAQUE IOHEXOL 300 MG/ML IV SOLN  Comparison: 06/15/2011  Findings: Trace bilateral pleural effusions with associated lower lobe atelectasis.  Mild cardiomegaly with a small pericardial effusion.  Coronary atherosclerosis.  Liver, spleen, and adrenal glands are within normal limits.  Pancreas is notable for a subcentimeter hypodense lesion in the pancreatic tail (series 2/image 30).  Stable hypodense bilateral renal lesions.  No hydronephrosis.  Gastrostomy tube.  Status post Billroth II.  No evidence of bowel obstruction.  Normal appendix.  Colonic diverticulosis, without associated inflammatory changes.  Atherosclerotic calcifications of the abdominal aorta and branch vessels.  Near complete resolution of fluid collection anterior to the lateral left hepatic lobe, measuring approximately 3.8 x 0.9 cm (series 2/image 25), with indwelling pigtail drain.  Additional 2.0 x 2.9 cm fluid collection just medial to the left hepatic lobe is improved (series 2/image 31).  No suspicious abdominopelvic lymphadenopathy.  Peroneal dialysis catheter.  Uterus and bilateral ovaries are unremarkable.  Bladder is decompressed by indwelling Foley catheter.  Degenerative changes of the visualized thoracolumbar spine.  Right total hip  arthroplasty without evidence of hardware complication.  IMPRESSION: Near complete resolution of fluid collection anterior to the left hepatic level with indwelling pigtail drain, now measuring 3.8 x 0.9 cm.  Additional 2.0 x 2.9 cm fluid collection medial to the left hepatic lobe, improved.  Stable ancillary findings as above.  Original Report Authenticated By: Charline Bills, M.D.   Dg [name] W/water Sol Cm  07/02/2011  *RADIOLOGY REPORT*  Clinical Data: History of Billroth II procedure for peptic ulcer. Prior upper GI demonstrate rapid emptying from the stomach with opacification of the cecum.  Evaluate for small bowel fistula or short gut.  UPPER GIWITH WATER SOLUTION CONTRAST  Technique: Upper GI series performed with water soluble contrast.  Comparison: Upper GI of 05/25/2011.  CT of 05/27/2011.  Findings: Pre procedure scout film demonstrates right hip arthroplasty.  Moderate stool within the rectum.  Injection of a total of 150 ml of Omnipaque-300 into the gastrostomy tube.  Normal postoperative appearance of the stomach. Gastroesophageal reflux again identified.  Promptly arising from the medial side of the stomach, at approximately the 8 o'clock position, contrast fills a small bowel loop which is likely in the mid to distal small bowel.  This promptly fills the cecum/terminal ileum.  Subsequently, primarily with RPO positioning, a small bowel loop fills from the gastric remnant at approximately the 6 o'clock position.  Example series 58.  This also is likely the mid to distal small bowel, promptly filling the cecum.  Postprocedure overhead film demonstrates no free perforation or extravasation of contrast.  IMPRESSION: 2 foci of communication between the residual stomach and small bowel.  The more inferior communication is felt to represent the gastrojejunostomy site.  Anastomosis is likely within the mid to distal small bowel, given relative small amount of remaining bowel between the stomach and  terminal ileum.  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  This communication is also to the mid to distal small bowel, promptly filling the cecum.  Gastroesophageal reflux.  Original Report Authenticated By:  Consuello Bossier, M.D.     Assessment:     S/p Resection of perforated ulcer with gastroileal fistula and shortgut-like symdrome    Plan:     Continue TNA/TPN IV nutrition  Flush G tube  Will need exploration of abdomen with takedown of gastroileal fistula, EGD (& possible revision of loop gastrojejunostomy) if she wishes to get off of TNA nutrition.  She is high risk for morbidity, so would be best to minimize with better nutrition/health.  She would need cardiac clearance first. The patient seemed more interested in pursuing with this. However I am skeptical at how good the inside is. The daughter initially skeptical but was willing to do whatever the patient wanted to do.  I noted there were no easy solutions. However I know she will not get off of TNA and tail she has surgery. My concern is what is reported life. I am concerned if she'll be able to physically he can or should be tube feed dependent. I did not force the to make a decision with surgery and I do not think strongly for it. However they seem more open to it now than when I saw him a month ago.  I would wait another month before proceeding. The CT scan did not show any new abscess or fluid collection.  We'll tentatively get cardiac clearance to see what her operative risk would be. If it strongly prohibitive, then I would not be strongly for surgery upon her. No path is easy for her  I see no open tract or fistula. Wound looks healed to me  RTC 3-4 weeks to see if improving

## 2011-11-26 NOTE — Patient Instructions (Signed)
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 We will need to stay on chronic IV nutrition arrest life.  The other option is to consider another operation to redo year anatomy such that he couldn't get off the IV nutrition.  Need help from her primary care physician and sent her about to eat again. What is the long-term prognosis?  Ulcer Disease You have an ulcer. This may be in your stomach (gastric ulcer) or in the first part of your small bowel, the duodenum (duodenal ulcer). An ulcer is a break in the lining of the stomach or duodenum. The ulcer causes erosion into the deeper tissue. CAUSES  The stomach has a lining to protect itself from the acid that digests food. The lining can be damaged in two main ways:  The Helico Pylori bacteria (H. Pyolori) can infect the lining of the stomach and cause ulcers.   Nonsteroidal, anti-inflammatory medications (NSAIDS) can cause gastric ulcerations.   Smoking tobacco can increase the acid in the stomach. This can lead to ulcers, and will impair healing of ulcers.  Other factors, such as alcohol use and stress may contribute to ulcer formation. Rarely, a tumor or cancer can cause an ulcer.  SYMPTOMS  The problems (symptoms) of ulcer disease are usually a burning or gnawing of the mid-upper belly (abdomen). This is often worse on an empty stomach and may get better with food. This may be associated with feeling sick to your stomach (nausea), bloating, and vomiting. If the ulcer results in bleeding, it can cause:  Black, tarry stools.   Vomiting of bright red blood.   Vomiting coffee-ground-looking materials.  With severe bleeding, there may be loss of consciousness and shock.  DIAGNOSIS  Learning what is wrong (diagnosis) is usually made based upon your history and an exam. Medications are so effective that further tests may not be necessary. If needed, other tests may  include:  Blood tests, x-rays, or heart tests. These are done to be sure that no other conditions are causing your symptoms.   X-rays (Barium studies) such as an upper GI series.   Most commonly, an upper GI endoscopy can confirm your diagnosis. This is when a flexible tube is passed through the mouth (using sedative medication). The tube is used to look at the inside of esophagus, stomach, and small bowel. Abnormal pieces of tissue may be removed to examine under the microscope (biopsy).  TREATMENT  Bleeding from ulcers can usually be treated via endoscopy. Rarely, surgery is needed for ulcers when bleeding cannot be stopped. Surgery is needed if the ulcer goes through the wall of the stomach or duodenum.  After any bleeding is stopped, medications are the main treatment:  If your ulcer was caused by the bacteria H. Pylori, then you will need antibiotics to kill the infection. Usually, a program involving more than one antibiotic, and a medicine for stomach acid, is prescribed.   Medicine to decrease acid production is used for almost all ulcers. Your caregiver will make a recommendation. They will also tell you how long you must use the medication.   Stop using any medications or substances that may have contributed to your ulcer (alcohol, tobacco, caffeine, for example).   Medications are available that protect the lining of the bowel.  HOME CARE INSTRUCTIONS   Continue regular work and usual activities unless advised otherwise by your caregiver.   Avoid tobacco, alcohol, and caffeine. Tobacco use will  decrease and slow the rates of healing.   Avoid foods that seem to aggravate or cause discomfort.   There are many over-the-counter products available to control stomach acid and other symptoms. Discuss these with your caregiver before using them. DO NOT substitute over-the-counter medications for prescription medications without discussing with your caregiver.   Special diets are not  usually needed.   Keep any follow-up appointments and blood tests, as directed.  SEEK MEDICAL CARE IF:   Your pain or other ulcer symptoms do not improve within a few days of starting treatment.   You develop diarrhea. This can be a complication of certain treatments.   You have ongoing indigestion or heartburn, even if your main ulcer symptoms are improved.  SEEK IMMEDIATE MEDICAL CARE IF:   You develop bright red, rectal bleeding; dark black, tarry stools; or vomit blood.   You become light-headed, weak, have fainting episodes, or become sweaty, cold and clammy.   You experience severe abdominal pain not controlled by medications. DO NOT take pain medications unless ordered by your caregiver.  Document Released: 03/25/2005 Document Revised: 06/04/2011 Document Reviewed: 02/10/2007 [facility] Patient Information 2012 [place], [place].

## 2011-12-01 ENCOUNTER — Encounter: Payer: Self-pay | Admitting: Internal Medicine

## 2011-12-01 ENCOUNTER — Other Ambulatory Visit: Payer: Self-pay | Admitting: Internal Medicine

## 2011-12-01 ENCOUNTER — Ambulatory Visit (HOSPITAL_COMMUNITY)
Admission: RE | Admit: 2011-12-01 | Discharge: 2011-12-01 | Disposition: A | Discharge status: Home / Self Care | Payer: Medicare HMO | Source: Ambulatory Visit | Attending: Internal Medicine | Admitting: Internal Medicine

## 2011-12-01 ENCOUNTER — Ambulatory Visit (INDEPENDENT_AMBULATORY_CARE_PROVIDER_SITE_OTHER): Payer: Medicare HMO | Admitting: Internal Medicine

## 2011-12-01 ENCOUNTER — Telehealth: Payer: Self-pay | Admitting: *Deleted

## 2011-12-01 VITALS — BP 117/75 | HR 89 | Temp 98.2°F | Ht 66.5 in | Wt 165.8 lb

## 2011-12-01 DIAGNOSIS — K316 Fistula of stomach and duodenum: Secondary | ICD-10-CM

## 2011-12-01 DIAGNOSIS — I871 Compression of vein: Secondary | ICD-10-CM | POA: Insufficient documentation

## 2011-12-01 DIAGNOSIS — R7881 Bacteremia: Secondary | ICD-10-CM

## 2011-12-01 DIAGNOSIS — T827XXA Infection and inflammatory reaction due to other cardiac and vascular devices, implants and grafts, initial encounter: Secondary | ICD-10-CM

## 2011-12-01 DIAGNOSIS — K912 Postsurgical malabsorption, not elsewhere classified: Secondary | ICD-10-CM | POA: Insufficient documentation

## 2011-12-01 NOTE — Telephone Encounter (Signed)
Thayer Ohm from [facility] called [phone] to verify she is to be at [facility] today at 3pm for placement of PICC for the enteral nutrition .  They will have her there

## 2011-12-01 NOTE — Progress Notes (Addendum)
RN received order to discontinue the patient's PICC line.  Patient identified with name and date of birth. PICC dressing removed, site unremarkable.  PICC line removed using sterile procedure @ 1200. PICC length equal to that noted in patient's hospital chart of 50 cm. Sterile petroleum gauze + sterile 4X4 applied to PICC site, pressure applied for 10 minutes and covered with Medipore tape as a pressure dressing. Patient tolerated procedure without complaints.  Patient instructed to limit use of arm for 1 hour. Patient/caregiver instructed that the pressure dressing should remain in place for 24 hours.  Written instructions included on patient's discharge paperwork. Patient/caregiver verbalized understanding of these instructions.  Paperwork sent with caregiver to be taken back to [facility].

## 2011-12-01 NOTE — Discharge Instructions (Signed)
Peripherally Inserted Central Catheter (PICC)  Home Guide  A peripherally inserted central catheter (PICC) is a long, thin, flexible tube that is inserted into a vein in the upper arm. It is a form of intravenous (IV) access. It is considered to be a "central" line because the tip of the PICC ends in a large vein in your chest. This large vein is called the superior vena cava (SVC). The PICC tip ends in the SVC because there is a lot of blood flow in the SVC. This allows medicines and IV fluids to be quickly distributed throughout the body. The PICC is inserted using a sterile technique by a specially trained nurse or physician. After the PICC is inserted, a chest X-ray is done to be sure it is in the correct place.   A PICC may be placed for different reasons, such as:   To give medicines and liquid nutrition that can only be given through a central line. Examples are:   Certain antibiotic treatments.   Chemotherapy.   Total parenteral nutrition (TPN).   To take frequent blood samples.   To give IV fluids and blood products.   If there is difficulty placing a peripheral intravenous (PIV) catheter.  If taken care of properly, a PICC can remain in place for several months. A PICC can also allow patients to go home early. Medicine and PICC care can be managed at home by a family member or home healthcare team.  RISKS AND COMPLICATIONS  Possible problems with a PICC can occasionally occur. This may include:   A clot (thrombus) forming in or at the tip of the PICC. This can cause the PICC to become clogged. A "clot-busting" medicine called tissue plasminogen activator (tPA) can be inserted into the PICC to help break up the clot.   Inflammation of the vein (phlebitis) in which the PICC is placed. Signs of inflammation may include redness, pain at the insertion site, red streaks, or being able to feel a "cord" in the vein where the PICC is located.   Infection in the PICC or at the insertion site. Signs of  infection may include fever, chills, redness, swelling, or pus drainage from the PICC insertion site.   PICC movement (malposition). The PICC tip may migrate from its original position due to excessive physical activity, forceful coughing, sneezing, or vomiting.   A break or cut in the PICC. It is important to not use scissors near the PICC.   Nerve or tendon irritation or injury during PICC insertion.  HOME CARE INSTRUCTIONS  Activity   You may bend your arm and move it freely. If your PICC is near or at the bend of your elbow, avoid activity with repeated motion at the elbow.   Avoid lifting heavy objects as instructed by your caregiver.   Avoid using a crutch with the arm on the same side as your PICC. You may need to use a walker.  PICC Dressing   Keep your PICC bandage (dressing) clean and dry to prevent infection.   Ask your caregiver when you may shower. Ask your caregiver to teach you how to wrap the PICC when you do take a shower.   Do not bathe, swim, or use hot tubs when you have a PICC.   Change the PICC dressing as instructed by your caregiver.   Change your PICC dressing if it becomes loose or wet.  General PICC Care   Check the PICC insertion site daily for leakage, redness, swelling,   or pain.   Flush the PICC as directed by your caregiver. Let your caregiver know right away if the PICC is difficult to flush or does not flush. Do not use force to flush the PICC.   Do not use a syringe that is less than 10 mLs to flush the PICC.   Never pull or tug on the PICC.   Avoid blood pressure checks on the arm with the PICC.   Keep your PICC identification card with you at all times.   Do not take the PICC out yourself. Only a trained clinical professional should remove the PICC.  SEEK IMMEDIATE MEDICAL CARE IF:   Your PICC is accidently pulled all the way out. If this happens, cover the insertion site with a bandage or gauze dressing. Do not throw the PICC away. Your caregiver will need to  inspect it.   Your PICC was tugged or pulled and has partially come out. Do not  push the PICC back in.   There is any type of drainage, redness, or swelling where the PICC enters the skin.   You cannot flush the PICC, it is difficult to flush, or the PICC leaks around the insertion site when it is flushed.   You hear a "flushing" sound when the PICC is flushed.   You have pain, discomfort, or numbness in your arm, shoulder, or jaw on the same side as the PICC .   You feel your heart "racing" or skipping beats.   You notice a hole or tear in the PICC.   You develop chills or a fever.  MAKE SURE YOU:    Understand these instructions.   Will watch your condition.   Will get help right away if you are not doing well or get worse.  Document Released: 12/20/2002 Document Revised: 06/04/2011 Document Reviewed: 10/20/2010  Exit[employer] Patient Information 2012 [employer].

## 2011-12-01 NOTE — Progress Notes (Addendum)
Patient ID: [name], female   DOB: June 30, 1931, 78 y.o.   MRN: [id number]     [facility] for Infectious Disease  Patient Active Problem List  Diagnoses  . DIABETES MELLITUS, TYPE II  . HYPERLIPIDEMIA  . Obesity, unspecified  . DEPRESSION  . HYPERTENSION  . DIVERTICULOSIS OF COLON  . HEMATOMA  . Anemia  . Perforated prepyloric gastric ulcer s/p antrectomy with Billroth II reconstruction  . Ulcer, colon  . Confusion  . Esophagitis, erosive  . 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 Difficult intravenous access  . C. difficile colitis- recent/still under treatment  . UTI (lower urinary tract infection)  . Acute encephalopathy  . Swelling of limb, right  . PICC line infection  . Bacteremia associated with intravascular line    Patient's Medications  New Prescriptions   No medications on file  Previous Medications   ACETAMINOPHEN (TYLENOL) 500 MG TABLET    500 mg by Feeding Tube route daily as needed. For pain   AMINO AC ELECT-CALC IN D15W (TPN SOLUTION, CLINIMIX E 5/15,) 5 % SOLN    Inject 93 mL/hr into the vein continuous.   AMLODIPINE (NORVASC) 10 MG TABLET    10 mg by Feeding Tube route daily.   BUTALBITAL-ACETAMINOPHEN-CAFFEINE (FIORICET, ESGIC) 50-325-40 MG PER TABLET    2 tablets by Feeding Tube route every 8 (eight) hours as needed. For headache   CLONIDINE (CATAPRES - DOSED IN MG/24 HR) 0.2 MG/24HR PATCH    Place 1 patch (0.2 mg total) onto the skin once a week.   DARIFENACIN (ENABLEX) 15 MG 24 HR TABLET    15 mg by Feeding Tube route daily.    ENOXAPARIN (LOVENOX) 80 MG/0.8ML INJECTION    Inject 0.8 mLs (80 mg total) into the skin every 12 (twelve) hours.   ERGOCALCIFEROL (VITAMIN D2) 50000 UNITS CAPSULE    50,000 Units by Feeding Tube route once a week.    FAT EMULSION IV    Inject 2 % into the vein continuous. With TPN   FENTANYL (DURAGESIC - DOSED MCG/HR) 50 MCG/HR    Place 1 patch onto the skin every 3 (three) days.   INSULIN ASPART  (NOVOLOG) 100 UNIT/ML INJECTION    Inject 3-5 Units into the skin 3 (three) times daily as needed. For cbg >150   METOPROLOL (LOPRESSOR) 50 MG TABLET    50 mg by Feeding Tube route 2 (two) times daily.   ONDANSETRON (ZOFRAN) 4 MG TABLET    4 mg by Feeding Tube route every 8 (eight) hours as needed. For nausea   OXYCODONE (OXY IR/ROXICODONE) 5 MG IMMEDIATE RELEASE TABLET    Place 5 mg into feeding tube every 4 (four) hours as needed. For pain   PANTOPRAZOLE (PROTONIX) 40 MG TABLET    40 mg by Feeding Tube route daily.   SACCHAROMYCES BOULARDII (FLORASTOR) 250 MG CAPSULE    Take 250 mg by mouth 2 (two) times daily.   SODIUM CHLORIDE 0.9 % SOLN 250 ML WITH VANCOMYCIN 1000 MG SOLR 1,000 MG    Inject 1,000 mg into the vein every evening.   TRAZODONE (DESYREL) 50 MG TABLET    Place 0.5-1 tablets (25-50 mg total) into feeding tube at bedtime as needed. For sleep  Modified Medications   No medications on file  Discontinued Medications   DEXTROSE 5 % SOLN 50 ML WITH CEFTRIAXONE 2 G SOLR 2 G    Inject 2 g into the vein daily. X 4 weeks,  follow-up with Dr. Daiva Eves (ID) prior to stopping it    Subjective: [name] is in for her hospital followup visit. She has a complicated medical history including a perforated duodenal ulcer last November that required a Billroth II distal gastrectomy. She was left with a short gut syndrome and has been receiving TNA through a PICC line ever since. She was treated for C. difficile colitis in February and this appears to have resolved. She was readmitted to the hospital in mid April with sepsis and was found to have polymicrobial bacteremia with methicillin-resistant coagulase-negative staph, Streptococcus bovis and Clostridium perfringens. Hospital notes indicate that she appeared to have right arm cellulitis and possible thrombophlebitis related to her PICC line. The PICC line was changed to her left arm. An abdominal CT scan showed postoperative changes but no  intra-abdominal infection. She was discharged on IV vancomycin, IV ceftriaxone and oral metronidazole. She followed up with her surgeon, [name], on May 30. His note indicates that she was still receiving IV vancomycin and ceftriaxone at that time but did not list metronidazole. Notes from [facility] skilled nursing facility that arrived with her today show that she is still on IV vancomycin but does not list any other antibiotics. My nurse called the skilled nursing facility but the nurse there wasn't able to tell us when her other antibiotics were stopped.  Mrs. Achey states that she is feeling better. She says that she has occasional mid abdominal pain. She denies any nausea, vomiting or diarrhea. She is unable to give more specific information and the caregiver with her from the skilled nursing facility does not know much about her condition.  Objective: Temp: 98.2 F (36.8 C) (06/04 1115) Temp src: Oral (06/04 1115) BP: 117/75 mmHg (06/04 1115) Pulse Rate: 89  (06/04 1115)  General: She is in no distress sitting in a wheelchair Skin: No rash. She has no cellulitis or evidence of thrombophlebitis in her right arm. Her left arm PICC site appears normal Lungs: Clear Cor: Regular S1 and S2 and no murmurs Abdomen: Healed midline incision. There is a scant amount of yellow drainage on the gauze dressing over her incision. I do not see any sinus tract. She has a left upper quadrant G-tube.   Assessment: I suspect that her polymicrobial bacteremia is now resolved. Repeat blood cultures in the hospital turned up negative fairly quickly and she had no evidence of intra-abdominal infection. I will stop the vancomycin today and have her PICC removed.  Although Streptococcus bovis is strongly associated with colonic malignancy I am not sure that with her advanced age and comorbid conditions further investigation for cancer is warranted. I will leave that decision up to her primary care  physician.  Plan: 1. Discontinue vancomycin and remove PICC 2. Observe off of antibiotics 3. Followup here as needed   Cliffton Asters, MD [facility] for Infectious Disease [facility] [phone] pager   [phone] cell 12/01/2011, 11:59 AM  Addendum: [name] promptly and correctly notify me that she will need a PICC line for her chronic TNA. I will arrange for replacement of the PICC as soon as possible and notify the skilled nursing facility.  Cliffton Asters, MD [facility] for Infectious Disease [facility] [phone] pager   [phone] cell 12/01/2011, 1:50 PM

## 2011-12-01 NOTE — Progress Notes (Signed)
Addended by: Jennet Maduro D on: 12/01/2011 02:07 PM   Modules accepted: Orders

## 2011-12-01 NOTE — Procedures (Signed)
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  Successful placement of left arm PICC via brachial vein access. 42 cm, 6 Fr triple lumen PICC with tip at cavoatrial junction.

## 2011-12-01 NOTE — Patient Instructions (Signed)
Call to [facility]  Pt scheduled for PICC placement at [facility] for 3 PM today.  Nursing Staff verbalized back the order and will provide transportation for the pt to [facility] for the PICC placement.

## 2011-12-01 NOTE — Progress Notes (Signed)
Addended by: Jennet Maduro D on: 12/01/2011 01:50 PM   Modules accepted: Orders

## 2011-12-20 ENCOUNTER — Inpatient Hospital Stay (HOSPITAL_COMMUNITY)
Admission: EM | Admit: 2011-12-20 | Discharge: 2011-12-25 | DRG: 315 | Disposition: A | Discharge status: Skilled Nursing Facility | Payer: Medicare HMO | Attending: Internal Medicine | Admitting: Internal Medicine

## 2011-12-20 ENCOUNTER — Encounter (HOSPITAL_COMMUNITY): Payer: Self-pay | Admitting: Emergency Medicine

## 2011-12-20 ENCOUNTER — Emergency Department (HOSPITAL_COMMUNITY): Payer: Medicare HMO

## 2011-12-20 DIAGNOSIS — D7289 Other specified disorders of white blood cells: Secondary | ICD-10-CM

## 2011-12-20 DIAGNOSIS — I10 Essential (primary) hypertension: Secondary | ICD-10-CM | POA: Diagnosis present

## 2011-12-20 DIAGNOSIS — D638 Anemia in other chronic diseases classified elsewhere: Secondary | ICD-10-CM | POA: Diagnosis present

## 2011-12-20 DIAGNOSIS — IMO0002 Reserved for concepts with insufficient information to code with codable children: Secondary | ICD-10-CM

## 2011-12-20 DIAGNOSIS — R509 Fever, unspecified: Secondary | ICD-10-CM

## 2011-12-20 DIAGNOSIS — K208 Other esophagitis without bleeding: Secondary | ICD-10-CM | POA: Diagnosis present

## 2011-12-20 DIAGNOSIS — K316 Fistula of stomach and duodenum: Secondary | ICD-10-CM | POA: Diagnosis present

## 2011-12-20 DIAGNOSIS — R7881 Bacteremia: Secondary | ICD-10-CM | POA: Diagnosis present

## 2011-12-20 DIAGNOSIS — E1165 Type 2 diabetes mellitus with hyperglycemia: Secondary | ICD-10-CM

## 2011-12-20 DIAGNOSIS — T80218A Other infection due to central venous catheter, initial encounter: Principal | ICD-10-CM | POA: Diagnosis present

## 2011-12-20 DIAGNOSIS — D649 Anemia, unspecified: Secondary | ICD-10-CM | POA: Diagnosis present

## 2011-12-20 DIAGNOSIS — T80219A Unspecified infection due to central venous catheter, initial encounter: Secondary | ICD-10-CM | POA: Diagnosis present

## 2011-12-20 DIAGNOSIS — E118 Type 2 diabetes mellitus with unspecified complications: Secondary | ICD-10-CM

## 2011-12-20 DIAGNOSIS — K632 Fistula of intestine: Secondary | ICD-10-CM | POA: Diagnosis present

## 2011-12-20 DIAGNOSIS — K912 Postsurgical malabsorption, not elsewhere classified: Secondary | ICD-10-CM | POA: Diagnosis present

## 2011-12-20 DIAGNOSIS — I82409 Acute embolism and thrombosis of unspecified deep veins of unspecified lower extremity: Secondary | ICD-10-CM | POA: Diagnosis present

## 2011-12-20 DIAGNOSIS — Y849 Medical procedure, unspecified as the cause of abnormal reaction of the patient, or of later complication, without mention of misadventure at the time of the procedure: Secondary | ICD-10-CM | POA: Diagnosis present

## 2011-12-20 DIAGNOSIS — E119 Type 2 diabetes mellitus without complications: Secondary | ICD-10-CM | POA: Diagnosis present

## 2011-12-20 DIAGNOSIS — A0472 Enterocolitis due to Clostridium difficile, not specified as recurrent: Secondary | ICD-10-CM | POA: Diagnosis present

## 2011-12-20 DIAGNOSIS — K2210 Ulcer of esophagus without bleeding: Secondary | ICD-10-CM | POA: Diagnosis present

## 2011-12-20 DIAGNOSIS — F0390 Unspecified dementia without behavioral disturbance: Secondary | ICD-10-CM | POA: Diagnosis present

## 2011-12-20 HISTORY — DX: Sepsis, unspecified organism: A41.9

## 2011-12-20 LAB — CBC
HCT: 32.1 % — ABNORMAL LOW (ref 36.0–46.0)
Hemoglobin: 10.7 g/dL — ABNORMAL LOW (ref 12.0–15.0)
MCH: 26.6 pg (ref 26.0–34.0)
MCHC: 33.3 g/dL (ref 30.0–36.0)
MCV: 79.9 fL (ref 78.0–100.0)
Platelets: 188 10*3/uL (ref 150–400)
RBC: 4.02 MIL/uL (ref 3.87–5.11)
RDW: 16.4 % — ABNORMAL HIGH (ref 11.5–15.5)
WBC: 10.6 10*3/uL — ABNORMAL HIGH (ref 4.0–10.5)

## 2011-12-20 LAB — COMPREHENSIVE METABOLIC PANEL
ALT: 45 U/L — ABNORMAL HIGH (ref 0–35)
AST: 59 U/L — ABNORMAL HIGH (ref 0–37)
Albumin: 2.9 g/dL — ABNORMAL LOW (ref 3.5–5.2)
Alkaline Phosphatase: 187 U/L — ABNORMAL HIGH (ref 39–117)
BUN: 25 mg/dL — ABNORMAL HIGH (ref 6–23)
CO2: 22 mEq/L (ref 19–32)
Calcium: 9.8 mg/dL (ref 8.4–10.5)
Chloride: 97 mEq/L (ref 96–112)
Creatinine, Ser: 0.78 mg/dL (ref 0.50–1.10)
GFR calc Af Amer: >90 mL/min (ref >90)
GFR calc non Af Amer: 78 mL/min — ABNORMAL LOW (ref >90)
Glucose, Bld: 151 mg/dL — ABNORMAL HIGH (ref 70–99)
Potassium: 4.7 mEq/L (ref 3.5–5.1)
Sodium: 128 mEq/L — ABNORMAL LOW (ref 135–145)
Total Bilirubin: 0.2 mg/dL — ABNORMAL LOW (ref 0.3–1.2)
Total Protein: 7.7 g/dL (ref 6.0–8.3)

## 2011-12-20 LAB — URINALYSIS, ROUTINE W REFLEX MICROSCOPIC
Bilirubin Urine: NEGATIVE
Glucose, UA: NEGATIVE mg/dL
Hgb urine dipstick: NEGATIVE
Ketones, ur: NEGATIVE mg/dL
Leukocytes, UA: NEGATIVE
Nitrite: NEGATIVE
Protein, ur: NEGATIVE mg/dL
Specific Gravity, Urine: 1.024 (ref 1.005–1.030)
Urobilinogen, UA: 0.2 mg/dL (ref 0.0–1.0)
pH: 5.5 (ref 5.0–8.0)

## 2011-12-20 LAB — LACTIC ACID, PLASMA: Lactic Acid, Venous: 1.2 mmol/L (ref 0.5–2.2)

## 2011-12-20 LAB — LIPASE, BLOOD: Lipase: 21 U/L (ref 11–59)

## 2011-12-20 LAB — DIFFERENTIAL
Basophils Absolute: 0 10*3/uL (ref 0.0–0.1)
Basophils Relative: 0 % (ref 0–1)
Eosinophils Absolute: 0 10*3/uL (ref 0.0–0.7)
Eosinophils Relative: 0 % (ref 0–5)
Lymphocytes Relative: 9 % — ABNORMAL LOW (ref 12–46)
Lymphs Abs: 1 10*3/uL (ref 0.7–4.0)
Monocytes Absolute: 0.5 10*3/uL (ref 0.1–1.0)
Monocytes Relative: 5 % (ref 3–12)
Neutro Abs: 9 10*3/uL — ABNORMAL HIGH (ref 1.7–7.7)
Neutrophils Relative %: 85 % — ABNORMAL HIGH (ref 43–77)

## 2011-12-20 LAB — GLUCOSE, CAPILLARY: Glucose-Capillary: 139 mg/dL — ABNORMAL HIGH (ref 70–99)

## 2011-12-20 MED ORDER — ACETAMINOPHEN 500 MG PO TABS
1000.0000 mg | ORAL_TABLET | Freq: Once | ORAL | Status: AC
  Administered 2011-12-20: 1000 mg via ORAL
  Filled 2011-12-20: qty 2

## 2011-12-20 MED ORDER — SODIUM CHLORIDE 0.9 % IV BOLUS (SEPSIS)
1000.0000 mL | Freq: Once | INTRAVENOUS | Status: AC
  Administered 2011-12-20: 1000 mL via INTRAVENOUS

## 2011-12-20 NOTE — ED Notes (Signed)
WGN:[id number]<HY> Expected date:12/20/11<BR> Expected time: 5:18 PM<BR> Means of arrival:Ambulance<BR> Comments:<BR> M51. 79 f. From facilyt. Elevated WBC and fever 102.2. 10 mins

## 2011-12-20 NOTE — ED Notes (Signed)
Bolus hung due to patients low blood pressure.

## 2011-12-20 NOTE — H&P (Addendum)
[facility] History and Physical  THAYER INABINET WUJ:[id number] DOB: Apr 20, 1932 DOA: 12/20/2011  Referring physician: ED, Dr. Effie Shy PCP: [name], MD   Chief Complaint: fever  HPI:  78 year old female with past medical history as indicated below who presented from nursing home after having a fever associated with poor oral intake. Patient is poor historian due to history of dementia so history was mainly obtained from ED physician. At this time patient has no complaints of chest pain, no shortness of breath, no abdominal pain but there was 1 episode of non bloody vomiting.   Review of Systems:   Constitutional: positive for  fever, negative for chills and malaise/fatigue. Negative for diaphoresis.  HENT: Negative for hearing loss, ear pain, nosebleeds, congestion, sore throat, neck pain, tinnitus and ear discharge.   Eyes: Negative for blurred vision, double vision, photophobia, pain, discharge and redness.  Respiratory: Negative for cough, hemoptysis, sputum production, shortness of breath, wheezing and stridor.   Cardiovascular: Negative for chest pain, palpitations, orthopnea, claudication and leg swelling.  Gastrointestinal: Negative for nausea, vomiting and abdominal pain. Negative for heartburn, constipation, blood in stool and melena.  Genitourinary: Negative for dysuria, urgency, frequency, hematuria and flank pain.  Musculoskeletal: Negative for myalgias, back pain, joint pain and falls.  Skin: Negative for itching and rash.  Neurological: Negative for dizziness and weakness. Negative for tingling, tremors, sensory change, speech change, focal weakness, loss of consciousness and headaches.  Endo/Heme/Allergies: Negative for environmental allergies and polydipsia. Does not bruise/bleed easily.  Psychiatric/Behavioral: Negative for suicidal ideas. The patient is not nervous/anxious.      Past Medical History  Diagnosis Date  . Hypertension   . Reflux   . Right  elbow pain     OTIF  . Dementia   . Depression   . Osteoarthritis   . Pancreatitis 11/2007    HOP  . Angiomyolipoma of kidney     right  . Ulcerative esophagitis 12/05/2007    hx elevated gastrin, severe on EGD by Dr Jena Gauss , h pylori negative  . Hiatal hernia   . S/P colonoscopy 2009    pt reports normal by Dr Lovell Sheehan  . Diabetes mellitus   . Anemia   . Hyponatremia   . Cellulitis   . Esophagitis   . Peptic ulcer   . Insomnia   . Sepsis    Past Surgical History  Procedure Date  . Orif right hip 1999    [facility]  . Umbilical hernia repair 98 months old    [place]  . Esophagogastroduodenoscopy 01/28/2011    Procedure: ESOPHAGOGASTRODUODENOSCOPY (EGD);  Surgeon: Arlyce Harman, MD;  Location: [facility];  Service: Endoscopy;  Laterality: N/A;  . Colonoscopy 01/28/2011    Procedure: COLONOSCOPY;  Surgeon: Arlyce Harman, MD;  Location: [facility];  Service: Endoscopy;  Laterality: N/A;  . Laparotomy 02/04/2011    Procedure: EXPLORATORY LAPAROTOMY;  Surgeon: Fabio Bering;  Location: [facility] ORS;  Service: General;  Laterality: N/A;  . Laparotomy 05/07/2011    Procedure: EXPLORATORY LAPAROTOMY;  Surgeon: Ardeth Sportsman, MD;  Location: [facility] OR;  Service: General;  Laterality: N/A;  lysis of adhesions  . Gastrostomy 05/07/2011    Procedure: GASTROSTOMY;  Surgeon: Ardeth Sportsman, MD;  Location: [facility] OR;  Service: General;  Laterality: N/A;  g-tube / j -tube placement  . Gastrectomy 05/07/2011    Procedure: GASTRECTOMY;  Surgeon: Ardeth Sportsman, MD;  Location: [facility] OR;  Service: General;  Laterality: N/A;  Partial gastrectomy  . Cholecystectomy 05/07/2011    Procedure: CHOLECYSTECTOMY;  Surgeon: Ardeth Sportsman, MD;  Location: [facility] OR;  Service: General;  Laterality: N/A;  open  . Tee without cardioversion 10/21/2011    Procedure: TRANSESOPHAGEAL ECHOCARDIOGRAM (TEE);  Surgeon: Wendall Stade, MD;  Location: [facility] ENDOSCOPY;  Service: Cardiovascular;  Laterality: N/A;   Social History:  reports that  she has never smoked. Her smokeless tobacco use includes Chew. She reports that she does not drink alcohol or use illicit drugs.  Allergies  Allergen Reactions  . Other Swelling    Pinto beans cause swelling and hives  . Penicillins Swelling    Family History  Problem Relation Age of Onset  . Cancer Mother     pelvic     Prior to Admission medications   Medication Sig Start Date End Date Taking? Authorizing Provider  acetaminophen (TYLENOL) 500 MG tablet 500 mg by Feeding Tube route daily as needed. For pain 10/27/11 10/26/12 Yes Ripudeep Jenna Luo, MD  Amino Ac Elect-Calc in D15W (TPN SOLUTION, CLINIMIX E 5/15,) 5 % SOLN Inject 93 mL/hr into the vein continuous.   Yes Historical Provider, MD  amLODipine (NORVASC) 10 MG tablet 10 mg by Feeding Tube route daily. 10/27/11 10/26/12 Yes Ripudeep Jenna Luo, MD  butalbital-acetaminophen-caffeine (FIORICET, ESGIC) 50-325-40 MG per tablet 2 tablets by Feeding Tube route every 8 (eight) hours as needed. For headache 08/18/11 08/17/12 Yes Laveda Norman, MD  Cholecalciferol (VITAMIN D3) 5000 UNITS CAPS Take 1 capsule by mouth once a week.   Yes Historical Provider, MD  cloNIDine (CATAPRES - DOSED IN MG/24 HR) 0.2 mg/24hr patch Place 1 patch (0.2 mg total) onto the skin once a week. 10/27/11 10/26/12 Yes Ripudeep Jenna Luo, MD  darifenacin (ENABLEX) 15 MG 24 hr tablet 15 mg by Feeding Tube route daily.    Yes Historical Provider, MD  enoxaparin (LOVENOX) 80 MG/0.8ML injection Inject 0.8 mLs (80 mg total) into the skin every 12 (twelve) hours. 10/27/11  Yes Ripudeep K Rai, MD  FAT EMULSION IV Inject 2 % into the vein continuous. With TPN   Yes Historical Provider, MD  fentaNYL (DURAGESIC - DOSED MCG/HR) 50 MCG/HR Place 1 patch onto the skin every 3 (three) days.   Yes Historical Provider, MD  insulin aspart (NOVOLOG) 100 UNIT/ML injection Inject 3-5 Units into the skin 3 (three) times daily as needed. For cbg >150   Yes Historical Provider, MD  metoprolol (LOPRESSOR) 50 MG  tablet 50 mg by Feeding Tube route 2 (two) times daily. 10/27/11 10/26/12 Yes Ripudeep K Rai, MD  ondansetron (ZOFRAN) 4 MG tablet 4 mg by Feeding Tube route every 8 (eight) hours as needed. For nausea 10/27/11  Yes Ripudeep Jenna Luo, MD  oxyCODONE (OXY IR/ROXICODONE) 5 MG immediate release tablet Place 5 mg into feeding tube every 4 (four) hours as needed. For pain   Yes Historical Provider, MD  pantoprazole (PROTONIX) 40 MG tablet 40 mg by Feeding Tube route daily. 10/27/11  Yes Ripudeep Jenna Luo, MD  saccharomyces boulardii (FLORASTOR) 250 MG capsule Take 250 mg by mouth 2 (two) times daily.   Yes Historical Provider, MD  traZODone (DESYREL) 50 MG tablet Place 0.5-1 tablets (25-50 mg total) into feeding tube at bedtime as needed. For sleep 10/27/11  Yes Ripudeep Jenna Luo, MD   Physical Exam: Filed Vitals:   12/20/11 2200 12/20/11 2218 12/20/11 2230 12/20/11 2239  BP: 106/57  85/39 99/43  Pulse: 89  87 86  Temp:  100.2 F (37.9 C)    TempSrc:  Rectal    Resp:      SpO2: 99%  99% 98%     General:  No acute distress, alert and awake   Eyes: PERRLA< no scleral icterus  ENT: no tonsillar erythema or exudates  Neck:supple, no lymphadenopathy  Cardiovascular: S1, S2, regular rate and rhythm  Respiratory: bilateral air entry, no wheezing  Abdomen: (+) BS, non tender, non distended  Skin: warm, dry  Musculoskeletal: no joint erythema or tenderness  Psychiatric: unable to assess due to patient's medical status  Neurologic: no focal neurologic deficits, sensory intact  Labs on Admission:  Basic Metabolic Panel:  Lab 12/20/11 1610  NA 128*  K 4.7  CL 97  CO2 22  GLUCOSE 151*  BUN 25*  CREATININE 0.78  CALCIUM 9.8  MG --  PHOS --   Liver Function Tests:  Lab 12/20/11 1856  AST 59*  ALT 45*  ALKPHOS 187*  BILITOT 0.2*  PROT 7.7  ALBUMIN 2.9*    Lab 12/20/11 1856  LIPASE 21  AMYLASE --   No results found for this basename: AMMONIA:5 in the last 168 hours CBC:  Lab  12/20/11 1856  WBC 10.6*  NEUTROABS 9.0*  HGB 10.7*  HCT 32.1*  MCV 79.9  PLT 188   CBG:  Lab 12/20/11 1729  GLUCAP 139*    Radiological Exams on Admission: Dg Chest Port 1 View 12/20/2011    IMPRESSION: Suboptimal inspiration accounts for bibasilar atelectasis.  Stable cardiomegaly without pulmonary edema.     EKG: Independently reviewed. Non acute  Assessment/Plan  Principal  Problems: * Fever and possible PICC line infection - PICC line needs to be removed and tip sent for culture - obtain urinalysis and urine culture results, follow up blood culture results - start vanco and zosyn empirically - follow up procalcitonin level - if blood cultures are negative for an infection then PICC line can be reinserted for TNA nutrition   Active Problems: Leukocytosis - secondary to possible PICC line infection (for TNA) - will remove picc line - trend WBC count - antibiotics as above   Esophagitis, erosive - continue protonix 40 mg daily   DIABETES MELLITUS, TYPE II - sliding scale insulin and CBG monitoring   HYPERTENSION - continue home medications  DVT - continue lovenox per home regimen  Antibiotics: 1. vanco 12/20/2011 --> 2. Zosyn 12/20/2011 -->  Code Status: full code Family Communication: none at bedside Disposition Plan: SNF when stable  Time spent 45 minutes  Manson Passey, MD  [facility] Pager [phone]  If 7PM-7AM, please contact night-coverage [email or web address] Password [facility] 12/20/2011, 11:49 PM

## 2011-12-20 NOTE — ED Notes (Signed)
Per EMS, pt developed fever 102.2 and emesis x 1 this am at [facility]. Hx long term vanc for sepsis but has been off vanc x 2 weeks.

## 2011-12-20 NOTE — ED Notes (Signed)
Per EMS, pt has been off vanc X 2 weeks. This am, developed fever of 102 and emesis x 1. Long term hx vans for recurrent sepsis.

## 2011-12-20 NOTE — ED Notes (Signed)
Pt sts she has been on long term vacomycin viw PICC line due to chronic recurrent infections. Patient PICC line in left upper arm. Patient sts no abdominal pain or nausea. Sts she feels like she "needs to go to the play."  Patient aware of her birthday but not location, time, date or president.

## 2011-12-20 NOTE — ED Provider Notes (Signed)
Medical screening examination/treatment/procedure(s) were conducted as a shared visit with non-physician practitioner(s) and myself.  I personally evaluated the patient during the encounter  Flint Melter, MD 12/20/11 2358

## 2011-12-20 NOTE — ED Provider Notes (Signed)
History     CSN: [id number]  Arrival date & time 12/20/11  1726   First MD Initiated Contact with Patient 12/20/11 1835      Chief Complaint  Patient presents with  . Fever  . Emesis    (Consider location/radiation/quality/duration/timing/severity/associated sxs/prior treatment) HPI Patient presents emergency department with fever, from [place] [facility] nursing [facility].  Patient, recently had been given vancomycin, but that has been discontinued.  Patient has had sepsis, multiple times in the past.  Patient is unable to give me much information.  She is awake and alert and will talk, but does not answer questions in a normal fashion.  She does have a history of dementia.  Nursing home reported 1 episode of emesis this morning. Past Medical History  Diagnosis Date  . Hypertension   . Reflux   . Right elbow pain     OTIF  . Dementia   . Depression   . Osteoarthritis   . Pancreatitis 11/2007    [facility]  . Angiomyolipoma of kidney     right  . Ulcerative esophagitis 12/05/2007    hx elevated gastrin, severe on EGD by Dr Jena Gauss , h pylori negative  . Hiatal hernia   . S/P colonoscopy 2009    pt reports normal by Dr Lovell Sheehan  . Diabetes mellitus   . Anemia   . Hyponatremia   . Cellulitis   . Esophagitis   . Peptic ulcer   . Insomnia   . Sepsis     Past Surgical History  Procedure Date  . Orif right hip 1999    [facility]  . Umbilical hernia repair 44 months old    [place]  . Esophagogastroduodenoscopy 01/28/2011    Procedure: ESOPHAGOGASTRODUODENOSCOPY (EGD);  Surgeon: Arlyce Harman, MD;  Location: [facility];  Service: Endoscopy;  Laterality: N/A;  . Colonoscopy 01/28/2011    Procedure: COLONOSCOPY;  Surgeon: Arlyce Harman, MD;  Location: [facility];  Service: Endoscopy;  Laterality: N/A;  . Laparotomy 02/04/2011    Procedure: EXPLORATORY LAPAROTOMY;  Surgeon: Fabio Bering;  Location: [facility] ORS;  Service: General;  Laterality: N/A;  . Laparotomy 05/07/2011    Procedure: EXPLORATORY  LAPAROTOMY;  Surgeon: Ardeth Sportsman, MD;  Location: [facility] OR;  Service: General;  Laterality: N/A;  lysis of adhesions  . Gastrostomy 05/07/2011    Procedure: GASTROSTOMY;  Surgeon: Ardeth Sportsman, MD;  [facility];  Service: General;  Laterality: N/A;  g-tube / j -tube placement  . Gastrectomy 05/07/2011    Procedure: GASTRECTOMY;  Surgeon: Ardeth Sportsman, MD;  Location: [facility] OR;  Service: General;  Laterality: N/A;  Partial gastrectomy  . Cholecystectomy 05/07/2011    Procedure: CHOLECYSTECTOMY;  Surgeon: Ardeth Sportsman, MD;  Location: [facility] OR;  Service: General;  Laterality: N/A;  open  . Tee without cardioversion 10/21/2011    Procedure: TRANSESOPHAGEAL ECHOCARDIOGRAM (TEE);  Surgeon: Wendall Stade, MD;  Location: [facility] ENDOSCOPY;  Service: Cardiovascular;  Laterality: N/A;    Family History  Problem Relation Age of Onset  . Cancer Mother     pelvic     History  Substance Use Topics  . Smoking status: Never Smoker   . Smokeless tobacco: Current User    Types: Chew   Comment: taken away while she is a SNF  . Alcohol Use: No     Hx of Alcohol dependecy     OB History    Grav Para Term Preterm Abortions TAB SAB Ect Mult Living  Review of Systems All other systems negative except as documented in the HPI. All pertinent positives and negatives as reviewed in the HPI.  Allergies  Other and Penicillins  Home Medications   Current Outpatient Rx  Name Route Sig Dispense Refill  . ACETAMINOPHEN 500 MG PO TABS Feeding Tube 500 mg by Feeding Tube route daily as needed. For pain    . CLINIMIX E/DEXTROSE (5/15) 5 % IV SOLN Intravenous Inject 93 mL/hr into the vein continuous.    . AMLODIPINE BESYLATE 10 MG PO TABS Feeding Tube 10 mg by Feeding Tube route daily.    Marland Kitchen BUTALBITAL-APAP-CAFFEINE 50-325-40 MG PO TABS Feeding Tube 2 tablets by Feeding Tube route every 8 (eight) hours as needed. For headache    . VITAMIN D3 5000 UNITS PO CAPS Oral Take 1 capsule by mouth once  a week.    Marland Kitchen CLONIDINE HCL 0.2 MG/24HR TD PTWK Transdermal Place 1 patch (0.2 mg total) onto the skin once a week. 4 patch   . DARIFENACIN HYDROBROMIDE ER 15 MG PO TB24 Feeding Tube 15 mg by Feeding Tube route daily.     Marland Kitchen ENOXAPARIN SODIUM 80 MG/0.8ML [place] SOLN Subcutaneous Inject 0.8 mLs (80 mg total) into the skin every 12 (twelve) hours. 22.4 Syringe   . FAT EMULSION IV Intravenous Inject 2 % into the vein continuous. With TPN    . FENTANYL 50 MCG/HR TD PT72 Transdermal Place 1 patch onto the skin every 3 (three) days.    . INSULIN ASPART 100 UNIT/ML [place] SOLN Subcutaneous Inject 3-5 Units into the skin 3 (three) times daily as needed. For cbg >150    . METOPROLOL TARTRATE 50 MG PO TABS Feeding Tube 50 mg by Feeding Tube route 2 (two) times daily.    Marland Kitchen ONDANSETRON HCL 4 MG PO TABS Feeding Tube 4 mg by Feeding Tube route every 8 (eight) hours as needed. For nausea    . OXYCODONE HCL 5 MG PO TABS Per Tube Place 5 mg into feeding tube every 4 (four) hours as needed. For pain    . PANTOPRAZOLE SODIUM 40 MG PO TBEC Feeding Tube 40 mg by Feeding Tube route daily.    Marland Kitchen SACCHAROMYCES BOULARDII 250 MG PO CAPS Oral Take 250 mg by mouth 2 (two) times daily.    . TRAZODONE HCL 50 MG PO TABS Per Tube Place 0.5-1 tablets (25-50 mg total) into feeding tube at bedtime as needed. For sleep      BP 127/52  Pulse 103  Temp 100.8 F (38.2 C) (Rectal)  Resp 18  SpO2 100%  Physical Exam  Nursing note and vitals reviewed. Constitutional: She appears well-developed and well-nourished. No distress.  HENT:  Head: Normocephalic and atraumatic.  Mouth/Throat: Oropharynx is clear and moist.  Eyes: Pupils are equal, round, and reactive to light.  Neck: Normal range of motion. Neck supple.  Cardiovascular: Normal rate, regular rhythm and normal heart sounds.  Exam reveals no gallop and no friction rub.   No murmur heard. Pulmonary/Chest: Effort normal and breath sounds normal. No respiratory distress.  Abdominal:  Soft. Bowel sounds are normal. She exhibits no distension. There is no tenderness.  Neurological: She is alert.  Skin: Skin is warm and dry. No rash noted.    ED Course  Procedures (including critical care time)  Labs Reviewed  GLUCOSE, CAPILLARY - Abnormal; Notable for the following:    Glucose-Capillary 139 (*)     All other components within normal limits  CBC - Abnormal; Notable  for the following:    WBC 10.6 (*)     Hemoglobin 10.7 (*)     HCT 32.1 (*)     RDW 16.4 (*)     All other components within normal limits  DIFFERENTIAL - Abnormal; Notable for the following:    Neutrophils Relative 85 (*)     Neutro Abs 9.0 (*)     Lymphocytes Relative 9 (*)     All other components within normal limits  COMPREHENSIVE METABOLIC PANEL - Abnormal; Notable for the following:    Sodium 128 (*)     Glucose, Bld 151 (*)     BUN 25 (*)     Albumin 2.9 (*)     AST 59 (*)     ALT 45 (*)     Alkaline Phosphatase 187 (*)     Total Bilirubin 0.2 (*)     GFR calc non Af Amer 78 (*)     All other components within normal limits  URINALYSIS, ROUTINE W REFLEX MICROSCOPIC  LACTIC ACID, PLASMA   Dg Chest Port 1 View  12/20/2011  *RADIOLOGY REPORT*  Clinical Data: Fever.  History of diabetes hypertension.  PORTABLE CHEST - 1 VIEW 12/20/2011 1851 hours:  Comparison: Portable chest x-ray 11/21/2011, 10/13/2011, 05/19/2011.  Findings: Suboptimal inspiration due to body habitus which accounts for atelectasis at the bases and accentuates the cardiac silhouette.  Taking this into account, cardiac silhouette moderately enlarged but stable.  Pulmonary vascularity normal without evidence of pulmonary edema.  Lungs otherwise clear.  IMPRESSION: Suboptimal inspiration accounts for bibasilar atelectasis.  Stable cardiomegaly without pulmonary edema.  Original Report Authenticated By: Arnell Sieving, M.D.    Patient has been stable here in the emergency department on her blood pressures.  She is not having  signs of sepsis based on her testing here in the emergency department.    MDM  MDM Reviewed: nursing note, previous chart and vitals Reviewed previous: labs and x-ray Interpretation: labs and x-ray          Carlyle Dolly, PA-C 12/20/11 2059

## 2011-12-20 NOTE — ED Provider Notes (Signed)
Patient with fever at her SNF. No evident source by history or on exam. I rolled the patient and examine her back. There are no wounds or areas concerning for cellulitis. The patient is generally stable and has improved temperature. The PICC line in the left arm is infusing well. The insertion site appears, normal.  Decision-making: Recurrent infection, without clear source. Doubt sepsis at this time. She has a PICC line that is a potential source for infection. The patient is not eating and drinking well at this time, so the PICC line cannot be removed. We will attempt to get another peripheral access prior to pulling the PICC line; we'll arrange for admission to complete the care.   Medical screening examination/treatment/procedure(s) were conducted as a shared visit with non-physician practitioner(s) and myself.  I personally evaluated the patient during the encounter  Flint Melter, MD 12/20/11 [phone]

## 2011-12-20 NOTE — ED Notes (Signed)
MD at bedside. 

## 2011-12-20 NOTE — ED Notes (Signed)
Turned patient on side to do rectal temp, she said leave me alone, I'm hurting!

## 2011-12-21 DIAGNOSIS — I82409 Acute embolism and thrombosis of unspecified deep veins of unspecified lower extremity: Secondary | ICD-10-CM | POA: Diagnosis present

## 2011-12-21 DIAGNOSIS — E118 Type 2 diabetes mellitus with unspecified complications: Secondary | ICD-10-CM

## 2011-12-21 DIAGNOSIS — I10 Essential (primary) hypertension: Secondary | ICD-10-CM

## 2011-12-21 DIAGNOSIS — R509 Fever, unspecified: Secondary | ICD-10-CM

## 2011-12-21 DIAGNOSIS — D7289 Other specified disorders of white blood cells: Secondary | ICD-10-CM

## 2011-12-21 DIAGNOSIS — E1165 Type 2 diabetes mellitus with hyperglycemia: Secondary | ICD-10-CM

## 2011-12-21 LAB — COMPREHENSIVE METABOLIC PANEL
ALT: 44 U/L — ABNORMAL HIGH (ref 0–35)
Alkaline Phosphatase: 156 U/L — ABNORMAL HIGH (ref 39–117)
BUN: 21 mg/dL (ref 6–23)
CO2: 21 mEq/L (ref 19–32)
Chloride: 102 mEq/L (ref 96–112)
GFR calc Af Amer: >90 mL/min (ref >90)
GFR calc non Af Amer: 80 mL/min — ABNORMAL LOW (ref >90)
Glucose, Bld: 122 mg/dL — ABNORMAL HIGH (ref 70–99)
Potassium: 4.3 mEq/L (ref 3.5–5.1)
Sodium: 133 mEq/L — ABNORMAL LOW (ref 135–145)
Total Bilirubin: 0.2 mg/dL — ABNORMAL LOW (ref 0.3–1.2)

## 2011-12-21 LAB — GLUCOSE, CAPILLARY: Glucose-Capillary: 99 mg/dL (ref 70–99)

## 2011-12-21 LAB — DIFFERENTIAL
Basophils Relative: 0 % (ref 0–1)
Eosinophils Absolute: 0.1 10*3/uL (ref 0.0–0.7)
Eosinophils Relative: 2 % (ref 0–5)
Lymphs Abs: 1.5 10*3/uL (ref 0.7–4.0)

## 2011-12-21 LAB — MAGNESIUM: Magnesium: 1.6 mg/dL (ref 1.5–2.5)

## 2011-12-21 LAB — CBC
MCH: 25.6 pg — ABNORMAL LOW (ref 26.0–34.0)
MCHC: 31.4 g/dL (ref 30.0–36.0)
MCV: 81.6 fL (ref 78.0–100.0)
Platelets: 161 10*3/uL (ref 150–400)
RBC: 3.86 MIL/uL — ABNORMAL LOW (ref 3.87–5.11)
RDW: 16.7 % — ABNORMAL HIGH (ref 11.5–15.5)

## 2011-12-21 LAB — HEMOGLOBIN A1C
Hgb A1c MFr Bld: 6.5 % — ABNORMAL HIGH (ref <5.7)
Mean Plasma Glucose: 140 mg/dL — ABNORMAL HIGH (ref <117)

## 2011-12-21 LAB — PROTIME-INR: Prothrombin Time: 16.1 seconds — ABNORMAL HIGH (ref 11.6–15.2)

## 2011-12-21 LAB — PROCALCITONIN: Procalcitonin: 26.06 ng/mL

## 2011-12-21 MED ORDER — BIOTENE DRY MOUTH MT LIQD
15.0000 mL | Freq: Two times a day (BID) | OROMUCOSAL | Status: DC
  Administered 2011-12-22 – 2011-12-25 (×7): 15 mL via OROMUCOSAL

## 2011-12-21 MED ORDER — PANTOPRAZOLE SODIUM 40 MG PO TBEC
40.0000 mg | DELAYED_RELEASE_TABLET | Freq: Every day | ORAL | Status: DC
  Administered 2011-12-21 – 2011-12-22 (×2): 40 mg via ORAL
  Filled 2011-12-21 (×3): qty 1

## 2011-12-21 MED ORDER — CHLORHEXIDINE GLUCONATE 0.12 % MT SOLN
15.0000 mL | Freq: Two times a day (BID) | OROMUCOSAL | Status: DC
  Administered 2011-12-21 – 2011-12-25 (×8): 15 mL via OROMUCOSAL
  Filled 2011-12-21 (×11): qty 15

## 2011-12-21 MED ORDER — MAGNESIUM SULFATE 40 MG/ML IJ SOLN
2.0000 g | Freq: Once | INTRAMUSCULAR | Status: AC
  Administered 2011-12-21: 2 g via INTRAVENOUS
  Filled 2011-12-21: qty 50

## 2011-12-21 MED ORDER — SODIUM CHLORIDE 0.9 % IV SOLN
750.0000 mg | Freq: Two times a day (BID) | INTRAVENOUS | Status: DC
  Administered 2011-12-21 – 2011-12-25 (×9): 750 mg via INTRAVENOUS
  Filled 2011-12-21 (×11): qty 750

## 2011-12-21 MED ORDER — AMLODIPINE BESYLATE 10 MG PO TABS
10.0000 mg | ORAL_TABLET | Freq: Every day | ORAL | Status: DC
  Administered 2011-12-21 – 2011-12-25 (×5): 10 mg via ORAL
  Filled 2011-12-21 (×5): qty 1

## 2011-12-21 MED ORDER — BUTALBITAL-APAP-CAFFEINE 50-325-40 MG PO TABS
2.0000 | ORAL_TABLET | Freq: Three times a day (TID) | ORAL | Status: DC | PRN
  Administered 2011-12-21 (×3): 2 via ORAL
  Administered 2011-12-25: 1 via ORAL
  Filled 2011-12-21 (×2): qty 1
  Filled 2011-12-21: qty 2
  Filled 2011-12-21: qty 1
  Filled 2011-12-21: qty 2

## 2011-12-21 MED ORDER — DARIFENACIN HYDROBROMIDE ER 15 MG PO TB24
15.0000 mg | ORAL_TABLET | Freq: Every day | ORAL | Status: DC
  Administered 2011-12-21 – 2011-12-25 (×5): 15 mg via ORAL
  Filled 2011-12-21 (×5): qty 1

## 2011-12-21 MED ORDER — METOPROLOL TARTRATE 50 MG PO TABS
50.0000 mg | ORAL_TABLET | Freq: Two times a day (BID) | ORAL | Status: DC
  Administered 2011-12-21 – 2011-12-25 (×8): 50 mg via ORAL
  Filled 2011-12-21 (×11): qty 1

## 2011-12-21 MED ORDER — PRO-STAT SUGAR FREE PO LIQD
30.0000 mL | Freq: Every day | ORAL | Status: DC
  Administered 2011-12-21 – 2011-12-25 (×5): 30 mL
  Filled 2011-12-21 (×5): qty 30

## 2011-12-21 MED ORDER — VITAMIN D3 25 MCG (1000 UNIT) PO TABS
5000.0000 [IU] | ORAL_TABLET | ORAL | Status: DC
  Administered 2011-12-21: 5000 [IU] via ORAL
  Filled 2011-12-21: qty 5

## 2011-12-21 MED ORDER — SODIUM CHLORIDE 0.9 % IV SOLN
INTRAVENOUS | Status: DC
  Administered 2011-12-21 – 2011-12-22 (×5): via INTRAVENOUS
  Administered 2011-12-23: 1000 mL via INTRAVENOUS
  Administered 2011-12-24: 03:00:00 via INTRAVENOUS

## 2011-12-21 MED ORDER — FENTANYL 50 MCG/HR TD PT72
50.0000 ug | MEDICATED_PATCH | TRANSDERMAL | Status: DC
  Administered 2011-12-21 – 2011-12-24 (×2): 50 ug via TRANSDERMAL
  Filled 2011-12-21 (×2): qty 1

## 2011-12-21 MED ORDER — CEFEPIME HCL 2 G IJ SOLR
2.0000 g | Freq: Two times a day (BID) | INTRAMUSCULAR | Status: DC
  Administered 2011-12-21 – 2011-12-25 (×9): 2 g via INTRAVENOUS
  Filled 2011-12-21 (×11): qty 2

## 2011-12-21 MED ORDER — OXYCODONE HCL 5 MG PO TABS
5.0000 mg | ORAL_TABLET | ORAL | Status: DC | PRN
  Administered 2011-12-21 – 2011-12-25 (×8): 5 mg
  Filled 2011-12-21 (×8): qty 1

## 2011-12-21 MED ORDER — JEVITY 1.2 CAL PO LIQD
1000.0000 mL | ORAL | Status: DC
Start: 2011-12-21 — End: 2011-12-25
  Administered 2011-12-21 – 2011-12-25 (×4): 1000 mL
  Filled 2011-12-21 (×2): qty 1000

## 2011-12-21 MED ORDER — CLONIDINE HCL 0.2 MG/24HR TD PTWK
0.2000 mg | MEDICATED_PATCH | TRANSDERMAL | Status: DC
  Administered 2011-12-21: 0.2 mg via TRANSDERMAL
  Filled 2011-12-21 (×2): qty 1

## 2011-12-21 MED ORDER — INSULIN ASPART 100 UNIT/ML ~~LOC~~ SOLN
0.0000 [IU] | Freq: Three times a day (TID) | SUBCUTANEOUS | Status: DC
Start: 2011-12-21 — End: 2011-12-25
  Administered 2011-12-21 – 2011-12-25 (×7): 2 [IU] via SUBCUTANEOUS

## 2011-12-21 MED ORDER — ENOXAPARIN SODIUM 80 MG/0.8ML ~~LOC~~ SOLN
80.0000 mg | Freq: Two times a day (BID) | SUBCUTANEOUS | Status: DC
  Administered 2011-12-21 – 2011-12-25 (×9): 80 mg via SUBCUTANEOUS
  Filled 2011-12-21 (×10): qty 0.8

## 2011-12-21 MED ORDER — INSULIN ASPART 100 UNIT/ML ~~LOC~~ SOLN: 0.0000 [IU] | Freq: Every day | SUBCUTANEOUS | Status: DC

## 2011-12-21 MED ORDER — TRAZODONE 25 MG HALF TABLET
25.0000 mg | ORAL_TABLET | Freq: Every evening | ORAL | Status: DC | PRN
  Administered 2011-12-23: 25 mg
  Filled 2011-12-21 (×2): qty 2

## 2011-12-21 MED ORDER — MORPHINE SULFATE 2 MG/ML IJ SOLN
1.0000 mg | INTRAMUSCULAR | Status: DC | PRN
  Administered 2011-12-21 – 2011-12-24 (×8): 1 mg via INTRAVENOUS
  Filled 2011-12-21 (×12): qty 1

## 2011-12-21 MED ORDER — SACCHAROMYCES BOULARDII 250 MG PO CAPS
250.0000 mg | ORAL_CAPSULE | Freq: Two times a day (BID) | ORAL | Status: DC
  Administered 2011-12-21 – 2011-12-25 (×9): 250 mg via ORAL
  Filled 2011-12-21 (×11): qty 1

## 2011-12-21 NOTE — Progress Notes (Signed)
INITIAL ADULT NUTRITION ASSESSMENT Date: 12/21/2011   Time: 1:52 PM Reason for Assessment: Consult for new TNA  ASSESSMENT: Female 78 y.o.  Dx: Fever  Hx:  Past Medical History  Diagnosis Date  . Hypertension   . Reflux   . Right elbow pain     OTIF  . Dementia   . Depression   . Osteoarthritis   . Pancreatitis 11/2007    [facility]  . Angiomyolipoma of kidney     right  . Ulcerative esophagitis 12/05/2007    hx elevated gastrin, severe on EGD by Dr Jena Gauss , h pylori negative  . Hiatal hernia   . S/P colonoscopy 2009    pt reports normal by Dr Lovell Sheehan  . Diabetes mellitus   . Anemia   . Hyponatremia   . Cellulitis   . Esophagitis   . Peptic ulcer   . Insomnia   . Sepsis     Related Meds:  Scheduled Meds:   . acetaminophen  1,000 mg Oral Once  . amLODipine  10 mg Oral Daily  . antiseptic oral rinse  15 mL Mouth Rinse q12n4p  . ceFEPime (MAXIPIME) IV  2 g Intravenous Q12H  . chlorhexidine  15 mL Mouth Rinse BID  . cholecalciferol  5,000 Units Oral Weekly  . cloNIDine  0.2 mg Transdermal Weekly  . darifenacin  15 mg Oral Daily  . enoxaparin  80 mg Subcutaneous Q12H  . fentaNYL  50 mcg Transdermal Q72H  . insulin aspart  0-15 Units Subcutaneous TID WC  . magnesium sulfate 1 - 4 g bolus IVPB  2 g Intravenous Once  . metoprolol  50 mg Oral BID  . pantoprazole  40 mg Oral Daily  . saccharomyces boulardii  250 mg Oral BID  . sodium chloride  1,000 mL Intravenous Once  . vancomycin  750 mg Intravenous Q12H  . DISCONTD: insulin aspart  0-5 Units Subcutaneous QHS   Continuous Infusions:   . sodium chloride 50 mL/hr at 12/21/11 1209   PRN Meds:.butalbital-acetaminophen-caffeine, morphine injection, oxyCODONE, traZODone   Ht: 5' 6.53" (169 cm)  Wt: 165 lb 12.6 oz (75.2 kg)  Ideal Wt: 59.09 kg % Ideal Wt: 126.9% Wt Readings from Last 10 Encounters:  12/21/11 165 lb 12.6 oz (75.2 kg)  12/01/11 165 lb 12 oz (75.184 kg)  11/06/11 163 lb 12.8 oz (74.299 kg)  10/27/11  184 lb 8.4 oz (83.7 kg)  10/27/11 184 lb 8.4 oz (83.7 kg)  08/17/11 162 lb (73.483 kg)  07/29/11 162 lb (73.483 kg)  07/29/11 162 lb (73.483 kg)  07/20/11 162 lb (73.483 kg)  07/03/11 155 lb 6.8 oz (70.5 kg)    Usual Wt: Unknown BMI: 26.62 kg/m^2 (Overweight)  Food/Nutrition Related Hx: Patient with G-tube. Patient has been receiving long term TPN. PICC line infection. Per RN, G-tube is flushed and is used to pass medications without complications. Received Dr. Venetia Constable  approval to initiate G-tube feedings.   Labs:  CMP     Component Value Date/Time   NA 133* 12/21/2011 0400   K 4.3 12/21/2011 0400   CL 102 12/21/2011 0400   CO2 21 12/21/2011 0400   GLUCOSE 122* 12/21/2011 0400   BUN 21 12/21/2011 0400   CREATININE 0.73 12/21/2011 0400   CALCIUM 9.4 12/21/2011 0400   PROT 6.9 12/21/2011 0400   ALBUMIN 2.4* 12/21/2011 0400   AST 39* 12/21/2011 0400   ALT 44* 12/21/2011 0400   ALKPHOS 156* 12/21/2011 0400   BILITOT 0.2* 12/21/2011 0400  GFRNONAA 80* 12/21/2011 0400   GFRAA >90 12/21/2011 0400     Intake/Output Summary (Last 24 hours) at 12/21/11 1353 Last data filed at 12/21/11 1211  Gross per 24 hour  Intake 716.75 ml  Output    100 ml  Net 616.75 ml     Diet Order:    Supplements/Tube Feeding: none at this time  IVF:    sodium chloride Last Rate: 50 mL/hr at 12/21/11 1209    Estimated Nutritional Needs:   Kcal: 1300-1477 Protein: 82.5-97.5 Fluid: 1 ml per kcal intake  NUTRITION DIAGNOSIS: -Inadequate oral intake (NI-2.1).  Status: Ongoing  RELATED TO: inability to eat  AS EVIDENCE BY: patient without diet order  MONITORING/EVALUATION(Goals): TF initiation/ tolerance, weights, labs, i/o's 1. Meet > 90% of estimated energy needs with nutrition support. 2. Positive tolerance of TF.   EDUCATION NEEDS: -No education needs identified at this time  INTERVENTION: 1. Initiate TF of Jevity 1.2 @ 15 ml/hr and increase by 10 ml every 8 hours to a goal rate of 45 ml/hr.  Plus 1 packet prostat daily. TOTAL enteral nutrition to provide 1396 kcal (meets 100% of estimated energy needs), 75 grams of protein (meets 91% of estimated protein needs), and 875 ml free water. Will closely monitor TF tolerance.  2. RD available for nutrition needs.   Dietitian [phone]  DOCUMENTATION CODES Per approved criteria  -Not Applicable    [name] 12/21/2011, 1:52 PM

## 2011-12-21 NOTE — Progress Notes (Addendum)
[name] is a 78 y.o. female with suspected picc line infection(long term picc line for tpn). She was transferred from snf with fever. Unfortunately, patient not able to give me a meaningful hx due to dementia. I have reviewed her chart, seen and examined her at bed side.    1. Fever     Past Medical History  Diagnosis Date  . Hypertension   . Reflux   . Right elbow pain     OTIF  . Dementia   . Depression   . Osteoarthritis   . Pancreatitis 11/2007    [facility]  . Angiomyolipoma of kidney     right  . Ulcerative esophagitis 12/05/2007    hx elevated gastrin, severe on EGD by Dr Jena Gauss , h pylori negative  . Hiatal hernia   . S/P colonoscopy 2009    pt reports normal by Dr Lovell Sheehan  . Diabetes mellitus   . Anemia   . Hyponatremia   . Cellulitis   . Esophagitis   . Peptic ulcer   . Insomnia   . Sepsis    Current Facility-Administered Medications  Medication Dose Route Frequency Provider Last Rate Last Dose  . 0.9 %  sodium chloride infusion   Intravenous Continuous Geovana Gebel, MD 75 mL/hr at 12/21/11 0113    . acetaminophen (TYLENOL) tablet 1,000 mg  1,000 mg Oral Once Carlyle Dolly, PA-C   1,000 mg at 12/20/11 2119  . amLODipine (NORVASC) tablet 10 mg  10 mg Oral Daily Alison Murray, MD   10 mg at 12/21/11 0957  . antiseptic oral rinse (BIOTENE) solution 15 mL  15 mL Mouth Rinse q12n4p Kellyanne Ellwanger, MD      . butalbital-acetaminophen-caffeine (FIORICET, ESGIC) 50-325-40 MG per tablet 2 tablet  2 tablet Oral Q8H PRN Alison Murray, MD   2 tablet at 12/21/11 0349  . ceFEPIme (MAXIPIME) 2 g in dextrose 5 % 50 mL IVPB  2 g Intravenous Q12H Leann Trefz Poindexter, PHARMD   2 g at 12/21/11 0147  . chlorhexidine (PERIDEX) 0.12 % solution 15 mL  15 mL Mouth Rinse BID Tarius Stangelo, MD   15 mL at 12/21/11 0734  . cholecalciferol (VITAMIN D) tablet 5,000 Units  5,000 Units Oral Weekly Alison Murray, MD   5,000 Units at 12/21/11 0957  . cloNIDine (CATAPRES - Dosed in mg/24 hr)  patch 0.2 mg  0.2 mg Transdermal Weekly Alison Murray, MD   0.2 mg at 12/21/11 1013  . darifenacin (ENABLEX) 24 hr tablet 15 mg  15 mg Oral Daily Alison Murray, MD   15 mg at 12/21/11 0957  . enoxaparin (LOVENOX) injection 80 mg  80 mg Subcutaneous Q12H Alison Murray, MD   80 mg at 12/21/11 1012  . fentaNYL (DURAGESIC - dosed mcg/hr) 50 mcg  50 mcg Transdermal Q72H Alison Murray, MD   50 mcg at 12/21/11 1018  . insulin aspart (novoLOG) injection 0-15 Units  0-15 Units Subcutaneous TID WC Alison Murray, MD      . magnesium sulfate IVPB 2 g 50 mL  2 g Intravenous Once Ixel Boehning, MD      . metoprolol (LOPRESSOR) tablet 50 mg  50 mg Oral BID Alison Murray, MD   50 mg at 12/21/11 0958  . morphine 2 MG/ML injection 1 mg  1 mg Intravenous Q4H PRN Alison Murray, MD   1 mg at 12/21/11 0734  . oxyCODONE (Oxy IR/ROXICODONE) immediate release tablet 5  mg  5 mg Per Tube Q4H PRN Alison Murray, MD   5 mg at 12/21/11 0901  . pantoprazole (PROTONIX) EC tablet 40 mg  40 mg Oral Daily Alison Murray, MD   40 mg at 12/21/11 0958  . saccharomyces boulardii (FLORASTOR) capsule 250 mg  250 mg Oral BID Alison Murray, MD   250 mg at 12/21/11 0957  . sodium chloride 0.9 % bolus 1,000 mL  1,000 mL Intravenous Once Jamesetta Orleans Lawyer, PA-C   1,000 mL at 12/20/11 1859  . traZODone (DESYREL) tablet 25-50 mg  25-50 mg Per Tube QHS PRN Alison Murray, MD      . vancomycin (VANCOCIN) 750 mg in sodium chloride 0.9 % 150 mL IVPB  750 mg Intravenous Q12H Leann Trefz Poindexter, PHARMD   750 mg at 12/21/11 0303  . DISCONTD: insulin aspart (novoLOG) injection 0-5 Units  0-5 Units Subcutaneous QHS Alison Murray, MD       Allergies  Allergen Reactions  . Other Swelling    Pinto beans cause swelling and hives  . Penicillins Swelling   Active Problems:  DIABETES MELLITUS, TYPE II  HYPERTENSION  Anemia  Esophagitis, erosive  PICC line infection  DVT (deep venous thrombosis)   Vital signs in last 24 hours: Temp:  [98.5 F  (36.9 C)-102.2 F (39 C)] 98.6 F (37 C) (06/24 0645) Pulse Rate:  [81-114] 81  (06/24 0645) Resp:  [16-22] 18  (06/24 0645) BP: (85-130)/(39-75) 118/75 mmHg (06/24 0645) SpO2:  [91 %-100 %] 96 % (06/24 0645) Weight:  [75.2 kg (165 lb 12.6 oz)] 75.2 kg (165 lb 12.6 oz) (06/24 0109) Weight change:  Last BM Date:  (unknown)  Intake/Output from previous day: 06/23 0701 - 06/24 0700 In: 388.8 [I.V.:358.8] Out: 100 [Urine:100] Intake/Output this shift:    Lab Results:  Basename 12/21/11 0400 12/20/11 1856  WBC 6.4 10.6*  HGB 9.9* 10.7*  HCT 31.5* 32.1*  PLT 161 188   BMET  Basename 12/21/11 0400 12/20/11 1856  NA 133* 128*  K 4.3 4.7  CL 102 97  CO2 21 22  GLUCOSE 122* 151*  BUN 21 25*  CREATININE 0.73 0.78  CALCIUM 9.4 9.8    Studies/Results: Dg Chest Port 1 View  12/20/2011  *RADIOLOGY REPORT*  Clinical Data: Fever.  History of diabetes hypertension.  PORTABLE CHEST - 1 VIEW 12/20/2011 1851 hours:  Comparison: Portable chest x-ray 11/21/2011, 10/13/2011, 05/19/2011.  Findings: Suboptimal inspiration due to body habitus which accounts for atelectasis at the bases and accentuates the cardiac silhouette.  Taking this into account, cardiac silhouette moderately enlarged but stable.  Pulmonary vascularity normal without evidence of pulmonary edema.  Lungs otherwise clear.  IMPRESSION: Suboptimal inspiration accounts for bibasilar atelectasis.  Stable cardiomegaly without pulmonary edema.  Original Report Authenticated By: Arnell Sieving, M.D.    Medications: I have reviewed the patient's current medications.   Physical exam GENERAL- alert HEAD- normal atraumatic, no neck masses, normal thyroid, no jvd RESPIRATORY- appears well, vitals normal, no respiratory distress, acyanotic, normal RR, ear and throat exam is normal, neck free of mass or lymphadenopathy, chest clear, no wheezing, crepitations, rhonchi, normal symmetric air entry CVS- regular rate and rhythm, S1, S2  normal, no murmur, click, rub or gallop ABDOMEN- abdomen is soft without significant tenderness, masses, organomegaly or guarding, gtube. NEURO- no focal deficits. EXTREMITIES- extremities normal, atraumatic, no cyanosis or edema  Plan  * PICC line infection, suspected- follow catheter tip cultures. Continue current abx.  May have to wait before placing another line. *  DIABETES MELLITUS, TYPE II- seems controlled. ? Feeding long term- ? Need for continued parenteral feeding as has g tube, especially with recurrent complications related to picc line.. * HYPERTENSION- controlled.  Anemia * Esophagitis, erosive- on ppi. * DVT (deep venous thrombosis)- lovenox as at home. * will need to address nutrition needs long term- maybe stick with gtube only.   Aamira Bischoff 12/21/2011 11:47 AM Pager: [phone].

## 2011-12-21 NOTE — Evaluation (Signed)
Physical Therapy Evaluation [name] MRN: [id number] DOB: 1932-04-27 Today's Date: 12/21/2011 Time: 1324-4010 PT Time Calculation (min): 10 min  PT Assessment / Plan / Recommendation Clinical Impression  78 yo female admitted with PICC line infection, fever. Unsure of pt's PLOF at SNF. Limited evaluation due to pt c/o abdominal and LE(L) pain with activity. Recommend return to SNF at discharge.     PT Assessment  Patient needs continued PT services    Follow Up Recommendations  Skilled nursing facility    Barriers to Discharge        lEquipment Recommendations  Defer to next venue    Recommendations for Other Services     Frequency Min 2X/week    Precautions / Restrictions Precautions Precautions: Fall Restrictions Weight Bearing Restrictions: No   Pertinent Vitals/Pain       Mobility  Bed Mobility Bed Mobility: Supine to Sit Supine to Sit: 4: Min guard Details for Bed Mobility Assistance: VCs safety, encouragment/instruction for pt to complete task. Transfers Transfers: Sit to Stand;Stand to Sit Sit to Stand: 3: Mod assist;With upper extremity assist;From bed Stand to Sit: 3: Mod assist;With upper extremity assist;To bed Details for Transfer Assistance: Multimodal cues for safety, hand placement. Assist to rise, stabilize, control descent. Pt c/o "legs hurting" once standing.  Ambulation/Gait Ambulation/Gait Assistance: 3: Mod assist Assistive device: Rolling walker Ambulation/Gait Assistance Details: 3-4 side steps to [facility] with RW. Deferred ambulation due to pt c/o abdomen and L LE pain with activity.     Exercises     PT Diagnosis: Difficulty walking;Generalized weakness;Acute pain  PT Problem List: Decreased strength;Decreased mobility;Pain;Decreased cognition;Decreased knowledge of use of DME;Decreased activity tolerance PT Treatment Interventions: DME instruction;Gait training;Functional mobility training;Therapeutic  activities;Therapeutic exercise;Patient/family education   PT Goals Acute Rehab PT Goals PT Goal Formulation: Patient unable to participate in goal setting Time For Goal Achievement: 01/04/12 Potential to Achieve Goals: Fair Pt will go Supine/Side to Sit: with supervision PT Goal: Supine/Side to Sit - Progress: Goal set today Pt will go Sit to Supine/Side: with supervision PT Goal: Sit to Supine/Side - Progress: Goal set today Pt will go Sit to Stand: with supervision PT Goal: Sit to Stand - Progress: Goal set today Pt will Transfer Bed to Chair/Chair to Bed: with min assist PT Transfer Goal: Bed to Chair/Chair to Bed - Progress: Goal set today Pt will Ambulate: 1 - 15 feet;with min assist;with rolling walker PT Goal: Ambulate - Progress: Goal set today  Visit Information  Last PT Received On: 12/21/11 Assistance Needed: +2 (safety)    Subjective Data  Subjective: "My stomach hurts" Patient Stated Goal: None stated   Prior Functioning  Home Living Available Help at Discharge: Skilled Nursing Facility Prior Function Comments: When questioning pt about prior functioning, pt stated no for all questions. Denied receiving any assistance or using walker at SNF Communication Communication: No difficulties    Cognition  Overall Cognitive Status: History of cognitive impairments - at baseline Arousal/Alertness: Awake/alert Orientation Level: Person Behavior During Session: Restless    Extremity/Trunk Assessment Right Lower Extremity Assessment RLE ROM/Strength/Tone: Due to impaired cognition;Unable to fully assess RLE ROM/Strength/Tone Deficits: Pt able to move LEs against gravity. Able to weightbear in standing.  Left Lower Extremity Assessment LLE ROM/Strength/Tone: Due to impaired cognition;Unable to fully assess LLE ROM/Strength/Tone Deficits: Pt able to move LEs against gravity. Able to weightbear in standing. Pt did c/o pain once standing. Trunk Assessment Trunk  Assessment: Normal   Balance    End  of Session PT - End of Session Equipment Utilized During Treatment: Gait belt Activity Tolerance: Patient limited by pain Patient left: in bed;with call bell/phone within reach;with bed alarm set   Seleena, Re[facility]ers [facility] 12/21/2011, 10:17 AM [phone]

## 2011-12-21 NOTE — Progress Notes (Signed)
ANTIBIOTIC CONSULT NOTE - INITIAL  Pharmacy Consult for Vancomycin & Cefepime Indication: Central line infection  Allergies  Allergen Reactions  . Other Swelling    Pinto beans cause swelling and hives  . Penicillins Swelling    Patient Measurements: Height: 5' 6.53" (169 cm) Weight: 165 lb 12.6 oz (75.2 kg) IBW/kg (Calculated) : 60.53    Vital Signs: Temp: 98.6 F (37 C) (06/24 0045) Temp src: Oral (06/24 0045) BP: 118/75 mmHg (06/24 0045) Pulse Rate: 82  (06/24 0045) Intake/Output from previous day: 06/23 0701 - 06/24 0700 In: -  Out: 100 [Urine:100] Intake/Output from this shift: Total I/O In: -  Out: 100 [Urine:100]  Labs:  [facility] 12/20/11 1856  WBC 10.6*  HGB 10.7*  PLT 188  LABCREA --  CREATININE 0.78   Estimated Creatinine Clearance: 60.8 ml/min (by C-G formula based on Cr of 0.78). No results found for this basename: VANCOTROUGH:2,VANCOPEAK:2,VANCORANDOM:2,GENTTROUGH:2,GENTPEAK:2,GENTRANDOM:2,TOBRATROUGH:2,TOBRAPEAK:2,TOBRARND:2,AMIKACINPEAK:2,AMIKACINTROU:2,AMIKACIN:2, in the last 72 hours   Microbiology: No results found for this or any previous visit (from the past 720 hour(s)).  Medical History: Past Medical History  Diagnosis Date  . Hypertension   . Reflux   . Right elbow pain     OTIF  . Dementia   . Depression   . Osteoarthritis   . Pancreatitis 11/2007    HOP  . Angiomyolipoma of kidney     right  . Ulcerative esophagitis 12/05/2007    hx elevated gastrin, severe on EGD by Dr Jena Gauss , h pylori negative  . Hiatal hernia   . S/P colonoscopy 2009    pt reports normal by Dr Lovell Sheehan  . Diabetes mellitus   . Anemia   . Hyponatremia   . Cellulitis   . Esophagitis   . Peptic ulcer   . Insomnia   . Sepsis     Medications:  Scheduled:    . acetaminophen  1,000 mg Oral Once  . amLODipine  10 mg Oral Daily  . cholecalciferol  5,000 Units Oral Weekly  . cloNIDine  0.2 mg Transdermal Weekly  . darifenacin  15 mg Oral Daily  .  enoxaparin  80 mg Subcutaneous Q12H  . fentaNYL  50 mcg Transdermal Q72H  . insulin aspart  0-15 Units Subcutaneous TID WC  . insulin aspart  0-5 Units Subcutaneous QHS  . metoprolol  50 mg Oral BID  . pantoprazole  40 mg Oral Daily  . saccharomyces boulardii  250 mg Oral BID  . sodium chloride  1,000 mL Intravenous Once   Infusions:    . sodium chloride     Assessment:  78 year old female presents with fever  IV Vancomycin and Zosyn originally ordered per pharmacy for central line infection.  Patient with noted PCN allergy with "swelling" listed as reaction.  Spoke with Dr Elisabeth Pigeon, who requested patient receive Cefepime and to d/c order for Zosyn.  Poor oral intake and on TNA prior to admission  Plan to remove PICC line and sent tip for culture  Plan then to reinsert PICC line for TNA if blood cultures show no infection  Goal of Therapy:  Vancomycin trough level 15-20 mcg/ml  Plan:   Vancomycin 750mg  IV q12h  Cefepime 2gm IV q12h  Check vancomycin trough level as needed  Follow cultures & sensitivities, renal function  Josecarlos Harriott Trefz 12/21/2011,1:11 AM

## 2011-12-21 NOTE — Progress Notes (Signed)
PARENTERAL NUTRITION CONSULT NOTE - INITIAL  Pharmacy Consult for TPN Indication: Continuation of long-term TPN  Allergies  Allergen Reactions  . Other Swelling    Pinto beans cause swelling and hives  . Penicillins Swelling    Patient Measurements: Height: 5' 6.53" (169 cm) Weight: 165 lb 12.6 oz (75.2 kg) IBW/kg (Calculated) : 60.53  Usual Weight:   Vital Signs: Temp: 98.6 F (37 C) (06/24 0645) Temp src: Oral (06/24 0645) BP: 118/75 mmHg (06/24 0645) Pulse Rate: 81  (06/24 0645) Intake/Output from previous day: 06/23 0701 - 06/24 0700 In: 388.8 [I.V.:358.8] Out: 100 [Urine:100]  Labs:  [facility] 12/21/11 0400 12/20/11 1856  WBC 6.4 10.6*  HGB 9.9* 10.7*  HCT 31.5* 32.1*  PLT 161 188  APTT 51* --  INR 1.26 --    Basename 12/21/11 0400 12/20/11 1856  NA 133* 128*  K 4.3 4.7  CL 102 97  CO2 21 22  GLUCOSE 122* 151*  BUN 21 25*  CREATININE 0.73 0.78  LABCREA -- --  CREAT24HRUR -- --  CALCIUM 9.4 9.8  MG 1.6 --  PHOS 4.6 --  PROT 6.9 7.7  ALBUMIN 2.4* 2.9*  AST 39* 59*  ALT 44* 45*  ALKPHOS 156* 187*  BILITOT 0.2* 0.2*  BILIDIR -- --  IBILI -- --  PREALBUMIN -- --  TRIG -- --  CHOLHDL -- --  CHOL -- --   Estimated Creatinine Clearance: 60.8 ml/min (by C-G formula based on Cr of 0.73).    Basename 12/20/11 1729  GLUCAP 139*    Medical History: Past Medical History  Diagnosis Date  . Hypertension   . Reflux   . Right elbow pain     OTIF  . Dementia   . Depression   . Osteoarthritis   . Pancreatitis 11/2007    HOP  . Angiomyolipoma of kidney     right  . Ulcerative esophagitis 12/05/2007    hx elevated gastrin, severe on EGD by Dr Jena Gauss , h pylori negative  . Hiatal hernia   . S/P colonoscopy 2009    pt reports normal by Dr Lovell Sheehan  . Diabetes mellitus   . Anemia   . Hyponatremia   . Cellulitis   . Esophagitis   . Peptic ulcer   . Insomnia   . Sepsis     Medications:  Scheduled:    . acetaminophen  1,000 mg Oral Once  .  amLODipine  10 mg Oral Daily  . antiseptic oral rinse  15 mL Mouth Rinse q12n4p  . ceFEPime (MAXIPIME) IV  2 g Intravenous Q12H  . chlorhexidine  15 mL Mouth Rinse BID  . cholecalciferol  5,000 Units Oral Weekly  . cloNIDine  0.2 mg Transdermal Weekly  . darifenacin  15 mg Oral Daily  . enoxaparin  80 mg Subcutaneous Q12H  . fentaNYL  50 mcg Transdermal Q72H  . insulin aspart  0-15 Units Subcutaneous TID WC  . insulin aspart  0-5 Units Subcutaneous QHS  . metoprolol  50 mg Oral BID  . pantoprazole  40 mg Oral Daily  . saccharomyces boulardii  250 mg Oral BID  . sodium chloride  1,000 mL Intravenous Once  . vancomycin  750 mg Intravenous Q12H   Infusions:    . sodium chloride 75 mL/hr at 12/21/11 0113    Insulin Requirements in the past 24 hours:  None  Current Nutrition:  Home TPN on hold d/t central line infection  Assessment:  80 YOF admit d/t central line  infection.  TNA appears to have been initiated for short gut syndrome d/t perforated duodenal ulcer with distal gastrectomy 04/2011.  PTA: Clinimix E 5/15 at 93 ml/hr continuous over 24 hours/day  PICC line to be removed, cultured, and a new PICC line to be inserted if blood cultures are negative.  Labs: Na low (unable to adjust in pre-mix TPN), Alk phos elevated at 156 (6/24) and LFTs mildly elevated at 39/44 (6/24)  Nutritional Goals:  Pending  Plan:   PICC line will not be placed today d/t waiting for blood culture results  Follow daily and resume TPN when able.  Lynann Beaver PharmD, BCPS Pager [phone] 12/21/2011 8:11 AM

## 2011-12-21 NOTE — ED Notes (Signed)
Report given to [facility] 1523.

## 2011-12-21 NOTE — ED Notes (Signed)
RN unavailable for report at this time. Will try again in 15 minutes.

## 2011-12-22 DIAGNOSIS — R509 Fever, unspecified: Secondary | ICD-10-CM

## 2011-12-22 DIAGNOSIS — IMO0002 Reserved for concepts with insufficient information to code with codable children: Secondary | ICD-10-CM

## 2011-12-22 DIAGNOSIS — E1165 Type 2 diabetes mellitus with hyperglycemia: Secondary | ICD-10-CM

## 2011-12-22 DIAGNOSIS — E118 Type 2 diabetes mellitus with unspecified complications: Secondary | ICD-10-CM

## 2011-12-22 DIAGNOSIS — D7289 Other specified disorders of white blood cells: Secondary | ICD-10-CM

## 2011-12-22 DIAGNOSIS — I10 Essential (primary) hypertension: Secondary | ICD-10-CM

## 2011-12-22 LAB — CBC
MCHC: 32.4 g/dL (ref 30.0–36.0)
Platelets: 180 10*3/uL (ref 150–400)
RDW: 16.7 % — ABNORMAL HIGH (ref 11.5–15.5)
WBC: 3.4 10*3/uL — ABNORMAL LOW (ref 4.0–10.5)

## 2011-12-22 LAB — COMPREHENSIVE METABOLIC PANEL
ALT: 39 U/L — ABNORMAL HIGH (ref 0–35)
AST: 41 U/L — ABNORMAL HIGH (ref 0–37)
Albumin: 2.3 g/dL — ABNORMAL LOW (ref 3.5–5.2)
Alkaline Phosphatase: 137 U/L — ABNORMAL HIGH (ref 39–117)
BUN: 15 mg/dL (ref 6–23)
Chloride: 102 mEq/L (ref 96–112)
Potassium: 4 mEq/L (ref 3.5–5.1)
Sodium: 132 mEq/L — ABNORMAL LOW (ref 135–145)
Total Protein: 6.5 g/dL (ref 6.0–8.3)

## 2011-12-22 LAB — URINE CULTURE
Colony Count: NO GROWTH
Culture  Setup Time: 201306241222

## 2011-12-22 LAB — GLUCOSE, CAPILLARY: Glucose-Capillary: 121 mg/dL — ABNORMAL HIGH (ref 70–99)

## 2011-12-22 NOTE — Progress Notes (Signed)
Clinical Social Work Department BRIEF PSYCHOSOCIAL ASSESSMENT 12/22/2011  Patient:  [name], [name]     Account Number:  [id number]     Admit date:  12/20/2011  Clinical Social Worker:  Candie Chroman  Date/Time:  12/22/2011 02:00 PM  Referred by:  Physician  Date Referred:  12/21/2011 Referred for  SNF Placement   Other Referral:   Interview type:  Family Other interview type:    PSYCHOSOCIAL DATA Living Status:  FACILITY Admitted from facility:  [facility] [place] Level of care:  Skilled Nursing Facility Primary support name:  Langston Reusing Primary support relationship to patient:  CHILD, ADULT Degree of support available:   supportive    CURRENT CONCERNS Current Concerns  Post-Acute Placement   Other Concerns:    SOCIAL WORK ASSESSMENT / PLAN Pt is a 78 yr old female admitted from [facility]. CSW contacted pt's daughter to assist with d/c planning. Family would like pt to return to [facility] upon hospital d/c . SNF contacted and able to readmit ( earliest may be Thurs . ). CSW will follow to assist with d/c planning back to SNF when stable.   Assessment/plan status:  Psychosocial Support/Ongoing Assessment of Needs Other assessment/ plan:   Information/referral to community resources:   None needed at this time.    PATIENT'S/FAMILY'S RESPONSE TO PLAN OF CARE: Family would like pt to return to [facility] when ready for d/c.    Cori Razor LCSW [phone]

## 2011-12-22 NOTE — Progress Notes (Signed)
[name] is a 78 y.o. female admitted with fever, with suspicion for infected picc line, which was in place for tpn as patient has suspected short gut syndrome after Billroth procedure in 11/12 for perforated duodenal ulcer. She apparently has had FTT. Unfortunately, the line has been replaced before after developing dvts. Patient may have to contend with gtube feeding only. We await picc line tip culture results, to determine duration of abx.  SUBJECTIVE Feels ok. No complaints.  1. Fever     Past Medical History  Diagnosis Date  . Hypertension   . Reflux   . Right elbow pain     OTIF  . Dementia   . Depression   . Osteoarthritis   . Pancreatitis 11/2007    HOP  . Angiomyolipoma of kidney     right  . Ulcerative esophagitis 12/05/2007    hx elevated gastrin, severe on EGD by Dr Jena Gauss , h pylori negative  . Hiatal hernia   . S/P colonoscopy 2009    pt reports normal by Dr Lovell Sheehan  . Diabetes mellitus   . Anemia   . Hyponatremia   . Cellulitis   . Esophagitis   . Peptic ulcer   . Insomnia   . Sepsis    Current Facility-Administered Medications  Medication Dose Route Frequency Provider Last Rate Last Dose  . 0.9 %  sodium chloride infusion   Intravenous Continuous Lind Ausley, MD 50 mL/hr at 12/21/11 2111    . amLODipine (NORVASC) tablet 10 mg  10 mg Oral Daily Alison Murray, MD   10 mg at 12/22/11 1017  . antiseptic oral rinse (BIOTENE) solution 15 mL  15 mL Mouth Rinse q12n4p Mardy Lucier, MD   15 mL at 12/22/11 1222  . butalbital-acetaminophen-caffeine (FIORICET, ESGIC) 50-325-40 MG per tablet 2 tablet  2 tablet Oral Q8H PRN Alison Murray, MD   2 tablet at 12/21/11 2140  . ceFEPIme (MAXIPIME) 2 g in dextrose 5 % 50 mL IVPB  2 g Intravenous Q12H Leann Trefz Poindexter, PHARMD   2 g at 12/22/11 1348  . chlorhexidine (PERIDEX) 0.12 % solution 15 mL  15 mL Mouth Rinse BID Unique Searfoss, MD   15 mL at 12/21/11 2053  . cholecalciferol (VITAMIN D) tablet 5,000 Units  5,000  Units Oral Weekly Alison Murray, MD   5,000 Units at 12/21/11 0957  . cloNIDine (CATAPRES - Dosed in mg/24 hr) patch 0.2 mg  0.2 mg Transdermal Weekly Alison Murray, MD   0.2 mg at 12/21/11 1013  . darifenacin (ENABLEX) 24 hr tablet 15 mg  15 mg Oral Daily Alison Murray, MD   15 mg at 12/22/11 1018  . enoxaparin (LOVENOX) injection 80 mg  80 mg Subcutaneous Q12H Alison Murray, MD   80 mg at 12/22/11 1019  . feeding supplement (JEVITY 1.2 CAL) liquid 1,000 mL  1,000 mL Per Tube Continuous Anastasio Champion, RD 25 mL/hr at 12/22/11 0100 1,000 mL at 12/22/11 0100  . feeding supplement (PRO-STAT SUGAR FREE 64) liquid 30 mL  30 mL Per Tube Q1200 Anastasio Champion, RD   30 mL at 12/22/11 1222  . fentaNYL (DURAGESIC - dosed mcg/hr) 50 mcg  50 mcg Transdermal Q72H Alison Murray, MD   50 mcg at 12/21/11 1018  . insulin aspart (novoLOG) injection 0-15 Units  0-15 Units Subcutaneous TID WC Alison Murray, MD   2 Units at 12/22/11 1306  . metoprolol (LOPRESSOR) tablet 50 mg  50 mg Oral BID Alison Murray, MD   50 mg at 12/22/11 1017  . morphine 2 MG/ML injection 1 mg  1 mg Intravenous Q4H PRN Alison Murray, MD   1 mg at 12/22/11 0546  . oxyCODONE (Oxy IR/ROXICODONE) immediate release tablet 5 mg  5 mg Per Tube Q4H PRN Alison Murray, MD   5 mg at 12/21/11 0901  . pantoprazole (PROTONIX) EC tablet 40 mg  40 mg Oral Daily Alison Murray, MD   40 mg at 12/22/11 1017  . saccharomyces boulardii (FLORASTOR) capsule 250 mg  250 mg Oral BID Alison Murray, MD   250 mg at 12/22/11 1017  . traZODone (DESYREL) tablet 25-50 mg  25-50 mg Per Tube QHS PRN Alison Murray, MD      . vancomycin (VANCOCIN) 750 mg in sodium chloride 0.9 % 150 mL IVPB  750 mg Intravenous Q12H Leann Trefz Poindexter, PHARMD   750 mg at 12/22/11 0302   Allergies  Allergen Reactions  . Other Swelling    Pinto beans cause swelling and hives  . Penicillins Swelling   Active Problems:  DIABETES MELLITUS, TYPE II  HYPERTENSION  Anemia  Esophagitis,  erosive  PICC line infection  DVT (deep venous thrombosis)   Vital signs in last 24 hours: Temp:  [98 F (36.7 C)-98.9 F (37.2 C)] 98.2 F (36.8 C) (06/25 1436) Pulse Rate:  [68-73] 68  (06/25 1436) Resp:  [18-20] 20  (06/25 1436) BP: (106-132)/(69-78) 132/75 mmHg (06/25 1436) SpO2:  [96 %-99 %] 99 % (06/25 1436) Weight change:  Last BM Date:  (unknown)  Intake/Output from previous day: 06/24 0701 - 06/25 0700 In: 1443 [I.V.:928; IV Piggyback:200] Out: 0  Intake/Output this shift: Total I/O In: 640 [I.V.:380; Other:260] Out: -   Lab Results:  Basename 12/22/11 0400 12/21/11 0400  WBC 3.4* 6.4  HGB 9.1* 9.9*  HCT 28.1* 31.5*  PLT 180 161   BMET  Basename 12/22/11 0400 12/21/11 0400  NA 132* 133*  K 4.0 4.3  CL 102 102  CO2 19 21  GLUCOSE 94 122*  BUN 15 21  CREATININE 0.69 0.73  CALCIUM 9.2 9.4    Studies/Results: Dg Chest Port 1 View  12/20/2011  *RADIOLOGY REPORT*  Clinical Data: Fever.  History of diabetes hypertension.  PORTABLE CHEST - 1 VIEW 12/20/2011 1851 hours:  Comparison: Portable chest x-ray 11/21/2011, 10/13/2011, 05/19/2011.  Findings: Suboptimal inspiration due to body habitus which accounts for atelectasis at the bases and accentuates the cardiac silhouette.  Taking this into account, cardiac silhouette moderately enlarged but stable.  Pulmonary vascularity normal without evidence of pulmonary edema.  Lungs otherwise clear.  IMPRESSION: Suboptimal inspiration accounts for bibasilar atelectasis.  Stable cardiomegaly without pulmonary edema.  Original Report Authenticated By: Arnell Sieving, M.D.    Medications: I have reviewed the patient's current medications.   Physical exam GENERAL- alert HEAD- normal atraumatic, no neck masses, normal thyroid, no jvd RESPIRATORY- appears well, vitals normal, no respiratory distress, acyanotic, normal RR, ear and throat exam is normal, neck free of mass or lymphadenopathy, chest clear, no wheezing,  crepitations, rhonchi, normal symmetric air entry CVS- regular rate and rhythm, S1, S2 normal, no murmur, click, rub or gallop ABDOMEN-gtube in place. NEURO- Grossly normal EXTREMITIES- extremities normal, atraumatic, no cyanosis or edema  Plan   * PICC line infection, suspected- follow catheter tip cultures. Continue current abx. Would not place another line for tpn, continue g tube feeds. * DIABETES MELLITUS,  TYPE II-  controlled.* HYPERTENSION- controlled.  * Anemia of chronic disease- stable. * Esophagitis, erosive- on ppi.  * DVT (deep venous thrombosis)- lovenox as at snf.     Juni Glaab 12/22/2011 2:44 PM Pager: [phone].

## 2011-12-22 NOTE — Progress Notes (Signed)
Patient arrived to 1311,alert, oriented to self,kept in bed comfortably.

## 2011-12-22 NOTE — Progress Notes (Signed)
Dr> Venetia Constable made aware re: episodes of diarrhea, new orders received, also told MD that I decreased TF rate to 15cc/hr.

## 2011-12-22 NOTE — Progress Notes (Signed)
Physical Therapy Treatment [name] MRN: [id number] DOB: 04-26-1932 Today's Date: 12/22/2011 Time: 6578-4696 PT Time Calculation (min): 8 min  PT Assessment / Plan / Recommendation Comments on Treatment Session  Pt stated she had to use bathroom so assisted pt to [facility]. After sitting for a few minutes, pt abruptly stood up and began returning to bed (without having used BSC). Assisted pt back to bed. Continue to recommend SNF.    Follow Up Recommendations  Skilled nursing facility    Barriers to Discharge        Equipment Recommendations  Defer to next venue    Recommendations for Other Services    Frequency Min 2X/week   Plan Discharge plan remains appropriate    Precautions / Restrictions Precautions Precautions: Fall Restrictions Weight Bearing Restrictions: No   Pertinent Vitals/Pain     Mobility  Bed Mobility Bed Mobility: Supine to Sit;Sit to Supine Supine to Sit: 5: Supervision;HOB elevated Sit to Supine: 5: Supervision;HOB elevated Details for Bed Mobility Assistance: VCs safety, encouragment/instruction for pt to complete task. Supervision to manage lines.  Transfers Transfers: Sit to Stand;Stand to [employer] Transfers Sit to Stand: 4: Min assist;From bed;From chair/3-in-1 Stand to Sit: 4: Min assist;To bed;To chair/3-in-1 Stand Pivot Transfers: 4: Min assist Details for Transfer Assistance: x 2. Multimodal cues for safety, hand placement. Assist to rise, stabilize, control descent, navigate with RW.  Pivot bed<>BSC with RW, however pt unable to toilet, so assisted back to bed.  Ambulation/Gait Ambulation/Gait Assistance: Not tested (comment)    Exercises     PT Diagnosis:    PT Problem List:   PT Treatment Interventions:     PT Goals Acute Rehab PT Goals PT Goal: Supine/Side to Sit - Progress: Progressing toward goal PT Goal: Sit to Supine/Side - Progress: Progressing toward goal PT Goal: Sit to Stand - Progress:  Progressing toward goal PT Transfer Goal: Bed to Chair/Chair to Bed - Progress: Progressing toward goal  Visit Information  Last PT Received On: 12/22/11 Assistance Needed: +1    Subjective Data  Subjective: "My stomach hurts" Patient Stated Goal: None stated   Cognition  Overall Cognitive Status: History of cognitive impairments - at baseline Arousal/Alertness: Awake/alert Orientation Level: Person Behavior During Session: WFL for tasks performed    Balance     End of Session PT - End of Session Activity Tolerance: Patient tolerated treatment well Patient left: in bed;with call bell/phone within reach;with bed alarm set    [name], [name] [facility] 12/22/2011, 9:57 AM [phone]

## 2011-12-22 NOTE — Progress Notes (Signed)
Patient had 2 loose BM's since she was transferred to 1311, per 5 west RN Brianna she increased TF to 35 cc/hour at 9 am, will decrease TF rate to 15cc/hr per previous order.Will continue to monitor.

## 2011-12-23 DIAGNOSIS — A0472 Enterocolitis due to Clostridium difficile, not specified as recurrent: Secondary | ICD-10-CM | POA: Diagnosis present

## 2011-12-23 DIAGNOSIS — E118 Type 2 diabetes mellitus with unspecified complications: Secondary | ICD-10-CM

## 2011-12-23 DIAGNOSIS — D7289 Other specified disorders of white blood cells: Secondary | ICD-10-CM

## 2011-12-23 DIAGNOSIS — E1165 Type 2 diabetes mellitus with hyperglycemia: Secondary | ICD-10-CM

## 2011-12-23 DIAGNOSIS — I10 Essential (primary) hypertension: Secondary | ICD-10-CM

## 2011-12-23 LAB — GLUCOSE, CAPILLARY
Glucose-Capillary: 104 mg/dL — ABNORMAL HIGH (ref 70–99)
Glucose-Capillary: 117 mg/dL — ABNORMAL HIGH (ref 70–99)
Glucose-Capillary: 121 mg/dL — ABNORMAL HIGH (ref 70–99)

## 2011-12-23 LAB — CBC
Hemoglobin: 8.4 g/dL — ABNORMAL LOW (ref 12.0–15.0)
MCH: 25.8 pg — ABNORMAL LOW (ref 26.0–34.0)
RBC: 3.26 MIL/uL — ABNORMAL LOW (ref 3.87–5.11)

## 2011-12-23 LAB — VANCOMYCIN, TROUGH: Vancomycin Tr: 17 ug/mL (ref 10.0–20.0)

## 2011-12-23 MED ORDER — PANTOPRAZOLE SODIUM 40 MG PO PACK
40.0000 mg | PACK | Freq: Every day | ORAL | Status: DC
  Administered 2011-12-23 – 2011-12-25 (×3): 40 mg
  Filled 2011-12-23 (×3): qty 20

## 2011-12-23 MED ORDER — METRONIDAZOLE 50 MG/ML ORAL SUSPENSION
500.0000 mg | Freq: Three times a day (TID) | ORAL | Status: DC
  Administered 2011-12-23 – 2011-12-25 (×8): 500 mg
  Filled 2011-12-23 (×9): qty 10

## 2011-12-23 NOTE — Progress Notes (Signed)
Brief Nutrition Note  RN reported positive tolerance of TF, no residuals. Patient currently on Jevity 1.2 @ 15 ml/hr and increasing by 10 ml every 8 hours to a goal rate of 45 ml/hr. Plus 1 packet prostat daily. RN reported patient with diarrhea and positive for C. Diff..   Once patient off IV fluid, recommend 150 ml water flush every 8 hours (TID).   RD to follow for nutrition plan of care.   [name] [facility] [phone]

## 2011-12-23 NOTE — Progress Notes (Signed)
Physical Therapy Treatment [name] MRN: [id number] DOB: 1932-04-19 Today's Date: 12/23/2011 Time: 1478-2956 PT Time Calculation (min): 11 min  PT Assessment / Plan / Recommendation Comments on Treatment Session  Initiated ambulation this session. Distance limited by fatigue and pt c/o R foot pain. Recommend return to SNF.     Follow Up Recommendations  Skilled nursing facility    Barriers to Discharge        Equipment Recommendations  Defer to next venue    Recommendations for Other Services    Frequency Min 2X/week   Plan Discharge plan remains appropriate    Precautions / Restrictions Precautions Precautions: Fall Restrictions Weight Bearing Restrictions: No   Pertinent Vitals/Pain C/o R foot pain with ambulation    Mobility  Bed Mobility Bed Mobility: Supine to Sit;Sit to Supine Supine to Sit: 5: Supervision Sit to Supine: 5: Supervision Transfers Transfers: Sit to Stand;Stand to Sit Sit to Stand: 4: Min assist;From bed;With upper extremity assist Stand to Sit: 3: Mod assist;To bed;With upper extremity assist Details for Transfer Assistance: Multimodal cues for safety, hand placement. Assist to rise, stabilize, control descent. Pt attempted to sit before safely positioned in front of bed.  Ambulation/Gait Ambulation/Gait Assistance: 1: +2 Total assist Ambulation/Gait: Patient Percentage: 70% Ambulation Distance (Feet): 40 Feet Assistive device: Rolling walker Ambulation/Gait Assistance Details: VCs safety, distance from RW. Assist to stabilize and navigate with RW throughout ambulation. Pt c/o R foot pain mid distance.  Difficulty advancing L LE Gait Pattern: Trunk flexed;Step-through pattern;Decreased stride length;Decreased step length - right;Decreased step length - left;Wide base of support    Exercises     PT Diagnosis:    PT Problem List:   PT Treatment Interventions:     PT Goals Acute Rehab PT Goals PT Goal: Supine/Side to  Sit - Progress: Met PT Goal: Sit to Supine/Side - Progress: Met PT Goal: Sit to Stand - Progress: Progressing toward goal PT Goal: Ambulate - Progress: Progressing toward goal  Visit Information  Last PT Received On: 12/23/11 Assistance Needed: +2 (safety)    Subjective Data  Subjective: "Can you put my head up some more?" Patient Stated Goal: None stated   Cognition  Overall Cognitive Status: History of cognitive impairments - at baseline Arousal/Alertness: Awake/alert Orientation Level: Person Behavior During Session: WFL for tasks performed    Balance     End of Session PT - End of Session Equipment Utilized During Treatment: Gait belt Activity Tolerance: Patient limited by pain;Patient limited by fatigue Patient left: in bed;with call bell/phone within reach;with bed alarm set   GP     [place], Flesch [facility] 12/23/2011, 2:39 PM [phone]

## 2011-12-23 NOTE — Progress Notes (Signed)
CRITICAL VALUE ALERT  Critical value received:  Positive C-diff  Date of notification:  12/23/11  Time of notification:  0915  Critical value read back:yes  Nurse who received alert:  Hulda Marin  MD notified (1st page):  Dr. Arthor Captain  Time of first page:  [phone]  MD notified (2nd page):  Time of second page:  Responding MD:  Dr Arthor Captain  Time MD responded:  [phone]

## 2011-12-23 NOTE — Progress Notes (Signed)
DAILY PROGRESS NOTE                              [facility]AL [facility]  SUBJECTIVE: Pleasantly demented, disoriented. But denies acute complaints.  OBJECTIVE: BP 123/58  Pulse 67  Temp 99.2 F (37.3 C) (Oral)  Resp 18  Ht 5' 6.54" (1.69 m)  Wt 75.297 kg (166 lb)  BMI 26.36 kg/m2  SpO2 99%  Intake/Output Summary (Last 24 hours) at 12/23/11 1522 Last data filed at 12/23/11 1437  Gross per 24 hour  Intake    698 ml  Output      0 ml  Net    698 ml                      Weight change:  Physical Exam: General: Awake, but disoriented. not in any acute distress. HEENT: anicteric sclera, pupils equal reactive to light and accommodation CVS: S1-S2 heard, no murmur rubs or gallops Chest: clear to auscultation bilaterally, no wheezing rales or rhonchi Abdomen:  normal bowel sounds, soft, nontender, nondistended, no organomegaly Neuro: Cranial nerves II-XII intact, no focal neurological deficits Extremities: no cyanosis, no clubbing or edema noted bilaterally   Lab Results:  Basename 12/22/11 0400 12/21/11 0400  NA 132* 133*  K 4.0 4.3  CL 102 102  CO2 19 21  GLUCOSE 94 122*  BUN 15 21  CREATININE 0.69 0.73  CALCIUM 9.2 9.4  MG -- 1.6  PHOS -- 4.6    Basename 12/22/11 0400 12/21/11 0400  AST 41* 39*  ALT 39* 44*  ALKPHOS 137* 156*  BILITOT 0.1* 0.2*  PROT 6.5 6.9  ALBUMIN 2.3* 2.4*    Basename 12/20/11 1856  LIPASE 21  AMYLASE --    Basename 12/22/11 0400 12/21/11 0400 12/20/11 1856  WBC 3.4* 6.4 --  NEUTROABS -- 4.3 9.0*  HGB 9.1* 9.9* --  HCT 28.1* 31.5* --  MCV 81.0 81.6 --  PLT 180 161 --   No results found for this basename: CKTOTAL:3,CKMB:3,CKMBINDEX:3,TROPONINI:3 in the last 72 hours No components found with this basename: POCBNP:3 No results found for this basename: DDIMER:2 in the last 72 hours  Basename 12/21/11 0400  HGBA1C 6.5*   No results found for this basename: CHOL:2,HDL:2,LDLCALC:2,TRIG:2,CHOLHDL:2,LDLDIRECT:2  in the last 72 hours  Basename 12/21/11 0400  TSH 3.265  T4TOTAL --  T3FREE --  THYROIDAB --   No results found for this basename: VITAMINB12:2,FOLATE:2,FERRITIN:2,TIBC:2,IRON:2,RETICCTPCT:2 in the last 72 hours  Micro Results: Recent Results (from the past 240 hour(s))  URINE CULTURE     Status: Normal   Collection Time   12/20/11  7:03 PM      Component Value Range Status Comment   Specimen Description URINE, RANDOM   Final    Special Requests NONE   Final    Culture  Setup Time [id number]   Final    Colony Count NO GROWTH   Final    Culture NO GROWTH   Final    Report Status 12/22/2011 FINAL   Final   CATH TIP CULTURE     Status: Normal (Preliminary result)   Collection Time   12/21/11  3:43 AM      Component Value Range Status Comment   Specimen Description CATH TIP   Final    Special Requests NONE   Final    Culture     Final    Value: 30 COLONIES STAPHYLOCOCCUS SPECIES (  COAGULASE NEGATIVE)     Note: RIFAMPIN AND GENTAMICIN SHOULD NOT BE USED AS SINGLE DRUGS FOR TREATMENT OF STAPH INFECTIONS.   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  A left arm PICC line has inadvertently been pulled out entirely.  PICC LINE PLACEMENT WITH ULTRASOUND AND FLUOROSCOPIC  GUIDANCE  Fluoroscopy Time: 0.3 minutes.  The right upper arm was initially prepped with chlorhexidine. Access of the right brachial vein was performed under ultrasound guidance with a 21 gauge needle.  A  guidewire was advanced under fluoroscopy.  The left arm was prepped with chlorhexidine, draped in the usual sterile fashion using maximum barrier technique (cap and mask, sterile gown, sterile gloves, large sterile sheet, hand hygiene and cutaneous antisepsis) and infiltrated locally with 1% Lidocaine.  Ultrasound demonstrated patency of the left brachial vein, and this was documented with an image.  Under real-time ultrasound guidance, this vein was accessed with a 21 gauge micropuncture needle and image documentation was performed.  The needle was exchanged over a guidewire for a peel-away sheath through which a 6 [place] triple lumen PICC trimmed to 42 cm was advanced, positioned with its tip at the lower SVC/right atrial junction.  Fluoroscopy during the procedure and fluoro spot radiograph confirms appropriate catheter position.  The catheter was flushed, secured to the skin with Prolene sutures, and covered with a sterile dressing.  Complications:  None  Findings:  After successful access of the right brachial vein, the guide wire advancement persistently demonstrated an area of venous occlusion or critical stenosis in the mid upper arm.  For this reason, right arm access was abandoned and a left upper extremity PICC line placed.  The patient is not a good candidate for future PICC line placement from the right arm.  IMPRESSION:  1.  Obstruction versus critical stenosis of the right brachial vein in the upper arm.  Initial attempted right PICC line placement was therefore abandoned.  The patient is not a good candidate for future right-sided PICC lines. 2.  Successful left arm PICC line placement with ultrasound and fluoroscopic guidance.  The catheter is ready for use with its tip at the cavoatrial junction.  Original Report Authenticated By: Reola Calkins, M.D.   Ir US Guide Vasc Access Left  12/02/2011  *RADIOLOGY REPORT*  Clinical Data: Short gut syndrome with chronic need for central venous access  for parenteral nutrition.  A left arm PICC line has inadvertently been pulled out entirely.  PICC LINE PLACEMENT WITH ULTRASOUND AND FLUOROSCOPIC  GUIDANCE  Fluoroscopy Time: 0.3 minutes.  The right upper arm was initially prepped with chlorhexidine. Access of the right brachial vein was performed under ultrasound guidance with a 21 gauge needle.  A guidewire was advanced under fluoroscopy.  The left arm was prepped with chlorhexidine, draped in the usual sterile fashion using maximum barrier technique (  cap and mask, sterile gown, sterile gloves, large sterile sheet, hand hygiene and cutaneous antisepsis) and infiltrated locally with 1% Lidocaine.  Ultrasound demonstrated patency of the left brachial vein, and this was documented with an image.  Under real-time ultrasound guidance, this vein was accessed with a 21 gauge micropuncture needle and image documentation was performed.  The needle was exchanged over a guidewire for a peel-away sheath through which a 6 [place] triple lumen PICC trimmed to 42 cm was advanced, positioned with its tip at the lower SVC/right atrial junction.  Fluoroscopy during the procedure and fluoro spot radiograph confirms appropriate catheter position.  The catheter was flushed, secured to the skin with Prolene sutures, and covered with a sterile dressing.  Complications:  None  Findings:  After successful access of the right brachial vein, the guide wire advancement persistently demonstrated an area of venous occlusion or critical stenosis in the mid upper arm.  For this reason, right arm access was abandoned and a left upper extremity PICC line placed.  The patient is not a good candidate for future PICC line placement from the right arm.  IMPRESSION:  1.  Obstruction versus critical stenosis of the right brachial vein in the upper arm.  Initial attempted right PICC line placement was therefore abandoned.  The patient is not a good candidate for future right-sided PICC lines. 2.   Successful left arm PICC line placement with ultrasound and fluoroscopic guidance.  The catheter is ready for use with its tip at the cavoatrial junction.  Original Report Authenticated By: Reola Calkins, M.D.   Dg Chest Port 1 View  12/20/2011  *RADIOLOGY REPORT*  Clinical Data: Fever.  History of diabetes hypertension.  PORTABLE CHEST - 1 VIEW 12/20/2011 1851 hours:  Comparison: Portable chest x-ray 11/21/2011, 10/13/2011, 05/19/2011.  Findings: Suboptimal inspiration due to body habitus which accounts for atelectasis at the bases and accentuates the cardiac silhouette.  Taking this into account, cardiac silhouette moderately enlarged but stable.  Pulmonary vascularity normal without evidence of pulmonary edema.  Lungs otherwise clear.  IMPRESSION: Suboptimal inspiration accounts for bibasilar atelectasis.  Stable cardiomegaly without pulmonary edema.  Original Report Authenticated By: Arnell Sieving, M.D.   Medications: Scheduled Meds:   . amLODipine  10 mg Oral Daily  . antiseptic oral rinse  15 mL Mouth Rinse q12n4p  . ceFEPime (MAXIPIME) IV  2 g Intravenous Q12H  . chlorhexidine  15 mL Mouth Rinse BID  . cholecalciferol  5,000 Units Oral Weekly  . cloNIDine  0.2 mg Transdermal Weekly  . darifenacin  15 mg Oral Daily  . enoxaparin  80 mg Subcutaneous Q12H  . feeding supplement  30 mL Per Tube Q1200  . fentaNYL  50 mcg Transdermal Q72H  . insulin aspart  0-15 Units Subcutaneous TID WC  . metoprolol  50 mg Oral BID  . metroNIDAZOLE  500 mg Per Tube TID  . pantoprazole sodium  40 mg Per Tube Q1200  . saccharomyces boulardii  250 mg Oral BID  . vancomycin  750 mg Intravenous Q12H  . DISCONTD: pantoprazole  40 mg Oral Daily   Continuous Infusions:   . sodium chloride 1,000 mL (12/23/11 1358)  . feeding supplement (JEVITY 1.2 CAL) 1,000 mL (12/23/11 0703)   PRN Meds:.butalbital-acetaminophen-caffeine, morphine injection, oxyCODONE, traZODone  ASSESSMENT & PLAN: Active  Problems:  DIABETES MELLITUS, TYPE II  HYPERTENSION  Anemia  Esophagitis, erosive  PICC line infection  DVT (deep venous thrombosis)  C. difficile colitis   C. difficile  colitis -Patient did have diarrhea, tested positive for C. difficile by PCR. -Started on Flagyl on 12/23/2011. -She is on IV antibiotics including vancomycin and Zosyn, she is also on Protonix.  Suspected PICC line-related bacteremia -Had chronic PICC line for TPN, this was removed and the the tip was sent for the culture. -Has procalcitonin of 26, patient is on vancomycin and Zosyn. -Final blood culture is pending.  Diabetes mellitus type 2 -This is been stable, patient is on tube feeding, no intention to restart her TPN. -Reasonable CBGs during this hospital stay.  Anemia of chronic disease -Stable, no symptoms or signs of anemia no evidence of worsening.  Esophagitis -Has erosive esophagitis, on Protonix continue.  Dementia -Patient is currently nursing facility resident secondary to dementia.    LOS: 3 days   Telesha Deguzman A 12/23/2011, 3:22 PM

## 2011-12-23 NOTE — Progress Notes (Signed)
ANTIBIOTIC CONSULT NOTE - F/U  Pharmacy Consult for Vancomycin Indication: Central line infection  Allergies  Allergen Reactions  . Other Swelling    Pinto beans cause swelling and hives  . Penicillins Swelling    Patient Measurements: Height: 5' 6.53" (169 cm) Weight: 165 lb 12.6 oz (75.2 kg) IBW/kg (Calculated) : 60.53    Vital Signs: Temp: 100 F (37.8 C) (06/25 2220) Temp src: Oral (06/25 2220) BP: 122/70 mmHg (06/25 2220) Pulse Rate: 77  (06/25 2220) Intake/Output from previous day: 06/25 0701 - 06/26 0700 In: 640 [I.V.:380] Out: -  Intake/Output from this shift:    Labs:  Basename 12/22/11 0400 12/21/11 0400 12/20/11 1856  WBC 3.4* 6.4 10.6*  HGB 9.1* 9.9* 10.7*  PLT 180 161 188  LABCREA -- -- --  CREATININE 0.69 0.73 0.78   Estimated Creatinine Clearance: 60.8 ml/min (by C-G formula based on Cr of 0.69).  Basename 12/23/11 0208  VANCOTROUGH 17.0  VANCOPEAK --  VANCORANDOM --  GENTTROUGH --  GENTPEAK --  GENTRANDOM --  TOBRATROUGH --  TOBRAPEAK --  TOBRARND --  AMIKACINPEAK --  AMIKACINTROU --  AMIKACIN --     Microbiology: Recent Results (from the past 720 hour(s))  URINE CULTURE     Status: Normal   Collection Time   12/20/11  7:03 PM      Component Value Range Status Comment   Specimen Description URINE, RANDOM   Final    Special Requests NONE   Final    Culture  Setup Time [id number]   Final    Colony Count NO GROWTH   Final    Culture NO GROWTH   Final    Report Status 12/22/2011 FINAL   Final   CATH TIP CULTURE     Status: Normal (Preliminary result)   Collection Time   12/21/11  3:43 AM      Component Value Range Status Comment   Specimen Description CATH TIP   Final    Special Requests NONE   Final    Culture Culture reincubated for better growth   Final    Report Status PENDING   Incomplete     Medical History: Past Medical History  Diagnosis Date  . Hypertension   . Reflux   . Right elbow pain     OTIF  . Dementia     . Depression   . Osteoarthritis   . Pancreatitis 11/2007    HOP  . Angiomyolipoma of kidney     right  . Ulcerative esophagitis 12/05/2007    hx elevated gastrin, severe on EGD by Dr Jena Gauss , h pylori negative  . Hiatal hernia   . S/P colonoscopy 2009    pt reports normal by Dr Lovell Sheehan  . Diabetes mellitus   . Anemia   . Hyponatremia   . Cellulitis   . Esophagitis   . Peptic ulcer   . Insomnia   . Sepsis     Medications:  Scheduled:     . amLODipine  10 mg Oral Daily  . antiseptic oral rinse  15 mL Mouth Rinse q12n4p  . ceFEPime (MAXIPIME) IV  2 g Intravenous Q12H  . chlorhexidine  15 mL Mouth Rinse BID  . cholecalciferol  5,000 Units Oral Weekly  . cloNIDine  0.2 mg Transdermal Weekly  . darifenacin  15 mg Oral Daily  . enoxaparin  80 mg Subcutaneous Q12H  . feeding supplement  30 mL Per Tube Q1200  . fentaNYL  50 mcg Transdermal Q72H  .  insulin aspart  0-15 Units Subcutaneous TID WC  . metoprolol  50 mg Oral BID  . pantoprazole  40 mg Oral Daily  . saccharomyces boulardii  250 mg Oral BID  . vancomycin  750 mg Intravenous Q12H   Infusions:     . sodium chloride 50 mL/hr at 12/22/11 2143  . feeding supplement (JEVITY 1.2 CAL) 1,000 mL (12/22/11 0100)   Assessment:  78 year old female presents with fever  IV Vancomycin and Zosyn originally ordered per pharmacy for central line infection.  Patient with noted PCN allergy with "swelling" listed as reaction.  Spoke with Dr Elisabeth Pigeon, who requested patient receive Cefepime and to d/c order for Zosyn.  Poor oral intake and on TNA prior to admission  Plan to remove PICC line and sent tip for culture  Plan then to reinsert PICC line for TNA if blood cultures show no infection  VT = 17 mg/L, @ goal.  SCr stable.  Goal of Therapy:  Vancomycin trough level 15-20 mcg/ml  Plan:   Continue Vancomycin 750mg  IV 12h.  F/u SCr/additional levels if needed.  Lorenza Evangelist 12/23/2011,3:26 AM

## 2011-12-23 NOTE — Progress Notes (Signed)
Patient TF increased to 25 cc/hr as ordered, o residual noted,no diarrhea at this time.

## 2011-12-24 DIAGNOSIS — D7289 Other specified disorders of white blood cells: Secondary | ICD-10-CM

## 2011-12-24 DIAGNOSIS — E118 Type 2 diabetes mellitus with unspecified complications: Secondary | ICD-10-CM

## 2011-12-24 DIAGNOSIS — E1165 Type 2 diabetes mellitus with hyperglycemia: Secondary | ICD-10-CM

## 2011-12-24 DIAGNOSIS — R197 Diarrhea, unspecified: Secondary | ICD-10-CM

## 2011-12-24 DIAGNOSIS — I10 Essential (primary) hypertension: Secondary | ICD-10-CM

## 2011-12-24 LAB — GLUCOSE, CAPILLARY

## 2011-12-24 NOTE — Progress Notes (Signed)
CSW consulted with the doctor. [facility] said they had a bed available for the patient. CSW was informed the patient is not ready today and may be ready tomorrow.   [name]. [name] [phone] '

## 2011-12-24 NOTE — Progress Notes (Signed)
UR done. 

## 2011-12-24 NOTE — Progress Notes (Addendum)
DAILY PROGRESS NOTE                              [facility]  SUBJECTIVE: Pleasantly demented, disoriented. But denies acute complaints. Per nursing staff has [name] big loose bowel movement.  OBJECTIVE: BP 153/72  Pulse 66  Temp 98.8 F (37.1 C) (Oral)  Resp 16  Ht 5' 6.54" (1.69 m)  Wt 75.887 kg (167 lb 4.8 oz)  BMI 26.57 kg/m2  SpO2 99%  Intake/Output Summary (Last 24 hours) at 12/24/11 1217 Last data filed at 12/24/11 0911  Gross per 24 hour  Intake   2671 ml  Output      0 ml  Net   2671 ml                      Weight change:  Physical Exam: General: Awake, but disoriented. not in any acute distress. HEENT: anicteric sclera, pupils equal reactive to light and accommodation CVS: S1-S2 heard, no murmur rubs or gallops Chest: clear to auscultation bilaterally, no wheezing rales or rhonchi Abdomen:  normal bowel sounds, soft, nontender, nondistended, no organomegaly Neuro: Cranial nerves II-XII intact, no focal neurological deficits Extremities: no cyanosis, no clubbing or edema noted bilaterally   Lab Results:  Basename 12/22/11 0400  NA 132*  K 4.0  CL 102  CO2 19  GLUCOSE 94  BUN 15  CREATININE 0.69  CALCIUM 9.2  MG --  PHOS --    Basename 12/22/11 0400  AST 41*  ALT 39*  ALKPHOS 137*  BILITOT 0.1*  PROT 6.5  ALBUMIN 2.3*   No results found for this basename: LIPASE:2,AMYLASE:2 in the last 72 hours  Basename 12/23/11 1740 12/22/11 0400  WBC 3.2* 3.4*  NEUTROABS -- --  HGB 8.4* 9.1*  HCT 25.8* 28.1*  MCV 79.1 81.0  PLT 197 180   No results found for this basename: CKTOTAL:3,CKMB:3,CKMBINDEX:3,TROPONINI:3 in the last 72 hours No components found with this basename: POCBNP:3 No results found for this basename: DDIMER:2 in the last 72 hours No results found for this basename: HGBA1C:2 in the last 72 hours No results found for this basename: CHOL:2,HDL:2,LDLCALC:2,TRIG:2,CHOLHDL:2,LDLDIRECT:2 in the last 72 hours No  results found for this basename: TSH,T4TOTAL,FREET3,T3FREE,THYROIDAB in the last 72 hours No results found for this basename: VITAMINB12:2,FOLATE:2,FERRITIN:2,TIBC:2,IRON:2,RETICCTPCT:2 in the last 72 hours  Micro Results: Recent Results (from the past 240 hour(s))  URINE CULTURE     Status: Normal   Collection Time   12/20/11  7:03 PM      Component Value Range Status Comment   Specimen Description URINE, RANDOM   Final    Special Requests NONE   Final    Culture  Setup Time [id number]   Final    Colony Count NO GROWTH   Final    Culture NO GROWTH   Final    Report Status 12/22/2011 FINAL   Final   CATH TIP CULTURE     Status: Normal (Preliminary result)   Collection Time   12/21/11  3:43 AM      Component Value Range Status Comment   Specimen Description CATH TIP   Final    Special Requests NONE   Final    Culture     Final    Value: 30 COLONIES STAPHYLOCOCCUS SPECIES (COAGULASE NEGATIVE)     Note: RIFAMPIN AND GENTAMICIN SHOULD NOT BE USED AS SINGLE DRUGS FOR TREATMENT OF STAPH INFECTIONS.  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  [name] left arm PICC line has inadvertently been pulled out entirely.  PICC LINE PLACEMENT WITH ULTRASOUND AND FLUOROSCOPIC  GUIDANCE  Fluoroscopy Time: 0.3 minutes.  The right upper arm was initially prepped with chlorhexidine. Access of the right brachial vein was performed under ultrasound guidance with [name] 21 gauge needle.  [name] guidewire was  advanced under fluoroscopy.  The left arm was prepped with chlorhexidine, draped in the usual sterile fashion using maximum barrier technique (cap and mask, sterile gown, sterile gloves, large sterile sheet, hand hygiene and cutaneous antisepsis) and infiltrated locally with 1% Lidocaine.  Ultrasound demonstrated patency of the left brachial vein, and this was documented with an image.  Under real-time ultrasound guidance, this vein was accessed with [name] 21 gauge micropuncture needle and image documentation was performed.  The needle was exchanged over [name] guidewire for [name] peel-away sheath through which [name] 6 [place] triple lumen PICC trimmed to 42 cm was advanced, positioned with its tip at the lower SVC/right atrial junction.  Fluoroscopy during the procedure and fluoro spot radiograph confirms appropriate catheter position.  The catheter was flushed, secured to the skin with Prolene sutures, and covered with [name] sterile dressing.  Complications:  None  Findings:  After successful access of the right brachial vein, the guide wire advancement persistently demonstrated an area of venous occlusion or critical stenosis in the mid upper arm.  For this reason, right arm access was abandoned and [name] left upper extremity PICC line placed.  The patient is not [name] good candidate for future PICC line placement from the right arm.  IMPRESSION:  1.  Obstruction versus critical stenosis of the right brachial vein in the upper arm.  Initial attempted right PICC line placement was therefore abandoned.  The patient is not [name] good candidate for future right-sided PICC lines. 2.  Successful left arm PICC line placement with ultrasound and fluoroscopic guidance.  The catheter is ready for use with its tip at the cavoatrial junction.  Original Report Authenticated By: Reola Calkins, M.D.   Ir US Guide Vasc Access Left  12/02/2011  *RADIOLOGY REPORT*  Clinical Data: Short gut syndrome with chronic need for central venous access for parenteral  nutrition.  [name] left arm PICC line has inadvertently been pulled out entirely.  PICC LINE PLACEMENT WITH ULTRASOUND AND FLUOROSCOPIC  GUIDANCE  Fluoroscopy Time: 0.3 minutes.  The right upper arm was initially prepped with chlorhexidine. Access of the right brachial vein was performed under ultrasound guidance with [name] 21 gauge needle.  [name] guidewire was advanced under fluoroscopy.  The left arm was prepped with chlorhexidine, draped in the usual sterile fashion using maximum barrier technique (cap and mask, sterile gown, sterile gloves, large sterile sheet, hand hygiene and cutaneous antisepsis) and infiltrated locally with 1% Lidocaine.  Ultrasound demonstrated  patency of the left brachial vein, and this was documented with an image.  Under real-time ultrasound guidance, this vein was accessed with [name] 21 gauge micropuncture needle and image documentation was performed.  The needle was exchanged over [name] guidewire for [name] peel-away sheath through which [name] 6 [place] triple lumen PICC trimmed to 42 cm was advanced, positioned with its tip at the lower SVC/right atrial junction.  Fluoroscopy during the procedure and fluoro spot radiograph confirms appropriate catheter position.  The catheter was flushed, secured to the skin with Prolene sutures, and covered with [name] sterile dressing.  Complications:  None  Findings:  After successful access of the right brachial vein, the guide wire advancement persistently demonstrated an area of venous occlusion or critical stenosis in the mid upper arm.  For this reason, right arm access was abandoned and [name] left upper extremity PICC line placed.  The patient is not [name] good candidate for future PICC line placement from the right arm.  IMPRESSION:  1.  Obstruction versus critical stenosis of the right brachial vein in the upper arm.  Initial attempted right PICC line placement was therefore abandoned.  The patient is not [name] good candidate for future right-sided PICC lines. 2.  Successful left arm  PICC line placement with ultrasound and fluoroscopic guidance.  The catheter is ready for use with its tip at the cavoatrial junction.  Original Report Authenticated By: Reola Calkins, M.D.   Dg Chest Port 1 View  12/20/2011  *RADIOLOGY REPORT*  Clinical Data: Fever.  History of diabetes hypertension.  PORTABLE CHEST - 1 VIEW 12/20/2011 1851 hours:  Comparison: Portable chest x-ray 11/21/2011, 10/13/2011, 05/19/2011.  Findings: Suboptimal inspiration due to body habitus which accounts for atelectasis at the bases and accentuates the cardiac silhouette.  Taking this into account, cardiac silhouette moderately enlarged but stable.  Pulmonary vascularity normal without evidence of pulmonary edema.  Lungs otherwise clear.  IMPRESSION: Suboptimal inspiration accounts for bibasilar atelectasis.  Stable cardiomegaly without pulmonary edema.  Original Report Authenticated By: Arnell Sieving, M.D.   Medications: Scheduled Meds:    . amLODipine  10 mg Oral Daily  . antiseptic oral rinse  15 mL Mouth Rinse q12n4p  . ceFEPime (MAXIPIME) IV  2 g Intravenous Q12H  . chlorhexidine  15 mL Mouth Rinse BID  . cholecalciferol  5,000 Units Oral Weekly  . cloNIDine  0.2 mg Transdermal Weekly  . darifenacin  15 mg Oral Daily  . enoxaparin  80 mg Subcutaneous Q12H  . feeding supplement  30 mL Per Tube Q1200  . fentaNYL  50 mcg Transdermal Q72H  . insulin aspart  0-15 Units Subcutaneous TID WC  . metoprolol  50 mg Oral BID  . metroNIDAZOLE  500 mg Per Tube TID  . pantoprazole sodium  40 mg Per Tube Q1200  . saccharomyces boulardii  250 mg Oral BID  . vancomycin  750 mg Intravenous Q12H   Continuous Infusions:    . sodium chloride 50 mL/hr at 12/24/11 0234  . feeding supplement (JEVITY 1.2 CAL) 1,000 mL (12/24/11 0739)   PRN Meds:.butalbital-acetaminophen-caffeine, morphine injection, oxyCODONE, traZODone  ASSESSMENT & PLAN: Active Problems:  DIABETES MELLITUS, TYPE II  HYPERTENSION  Anemia   Esophagitis, erosive  PICC line infection  DVT (deep venous thrombosis)  C. difficile colitis   C. difficile colitis -Patient did have diarrhea, tested positive for C. difficile by PCR. -Started on Flagyl on 12/23/2011. -She is on IV antibiotics including vancomycin and cefepime, she is also on  Protonix. -In the culture continued to be negative tomorrow I will discontinue all antibiotics except Flagyl of course.  Suspected PICC line-related bacteremia -Had chronic PICC line for TPN, this was removed and the the tip was sent for the culture. -Has procalcitonin of 26, patient is on vancomycin and Zosyn. 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 -We will restart the TPN, when she discharge in the morning.  Diabetes mellitus type 2 -This is been stable, patient is on tube feeding and she will be on TPN at discharge. -Reasonable CBGs during this hospital stay.  Anemia of chronic disease -Stable, no symptoms or signs of anemia no evidence of worsening.  Esophagitis -Has erosive esophagitis, on Protonix continue.  Dementia -Patient is currently nursing facility resident secondary to dementia.  Disposition -Likely to be discharged back to the SNF in the morning.   LOS: 4 days   [name] 12/24/2011, 12:17 PM

## 2011-12-25 ENCOUNTER — Inpatient Hospital Stay (HOSPITAL_COMMUNITY): Payer: Medicare HMO

## 2011-12-25 DIAGNOSIS — R197 Diarrhea, unspecified: Secondary | ICD-10-CM

## 2011-12-25 DIAGNOSIS — E118 Type 2 diabetes mellitus with unspecified complications: Secondary | ICD-10-CM

## 2011-12-25 DIAGNOSIS — E1165 Type 2 diabetes mellitus with hyperglycemia: Secondary | ICD-10-CM

## 2011-12-25 DIAGNOSIS — I10 Essential (primary) hypertension: Secondary | ICD-10-CM

## 2011-12-25 DIAGNOSIS — D7289 Other specified disorders of white blood cells: Secondary | ICD-10-CM

## 2011-12-25 LAB — BASIC METABOLIC PANEL
BUN: 6 mg/dL (ref 6–23)
CO2: 22 mEq/L (ref 19–32)
Calcium: 8.2 mg/dL — ABNORMAL LOW (ref 8.4–10.5)
Creatinine, Ser: 0.52 mg/dL (ref 0.50–1.10)
Glucose, Bld: 108 mg/dL — ABNORMAL HIGH (ref 70–99)

## 2011-12-25 LAB — MAGNESIUM: Magnesium: 1.3 mg/dL — ABNORMAL LOW (ref 1.5–2.5)

## 2011-12-25 LAB — GLUCOSE, CAPILLARY
Glucose-Capillary: 119 mg/dL — ABNORMAL HIGH (ref 70–99)
Glucose-Capillary: 126 mg/dL — ABNORMAL HIGH (ref 70–99)

## 2011-12-25 LAB — CBC
MCH: 25.7 pg — ABNORMAL LOW (ref 26.0–34.0)
MCV: 79 fL (ref 78.0–100.0)
Platelets: 243 10*3/uL (ref 150–400)
RDW: 16 % — ABNORMAL HIGH (ref 11.5–15.5)

## 2011-12-25 MED ORDER — MAGNESIUM SULFATE 40 MG/ML IJ SOLN
2.0000 g | Freq: Once | INTRAMUSCULAR | Status: AC
  Administered 2011-12-25: 2 g via INTRAVENOUS
  Filled 2011-12-25: qty 50

## 2011-12-25 MED ORDER — CLONIDINE HCL 0.2 MG/24HR TD PTWK
0.2000 mg | MEDICATED_PATCH | TRANSDERMAL | Status: DC
  Administered 2011-12-25: 0.2 mg via TRANSDERMAL
  Filled 2011-12-25: qty 1

## 2011-12-25 MED ORDER — METRONIDAZOLE 50 MG/ML ORAL SUSPENSION: 500.0000 mg | Freq: Three times a day (TID) | ORAL | Status: DC

## 2011-12-25 MED ORDER — POTASSIUM CHLORIDE 10 MEQ/100ML IV SOLN
10.0000 meq | INTRAVENOUS | Status: AC
  Administered 2011-12-25 (×2): 10 meq via INTRAVENOUS
  Filled 2011-12-25 (×5): qty 100

## 2011-12-25 MED ORDER — POTASSIUM CHLORIDE 20 MEQ/15ML (10%) PO LIQD
60.0000 meq | Freq: Four times a day (QID) | ORAL | Status: AC
  Administered 2011-12-25 (×2): 60 meq
  Filled 2011-12-25 (×2): qty 45

## 2011-12-25 MED ORDER — HEPARIN SOD (PORK) LOCK FLUSH 100 UNIT/ML IV SOLN
INTRAVENOUS | Status: AC
  Administered 2011-12-25: 500 [IU]
  Filled 2011-12-25: qty 5

## 2011-12-25 NOTE — Progress Notes (Signed)
Physical Therapy Treatment [name] MRN: [id number] DOB: 15-Sep-1931 Today's Date: 12/25/2011 Time: 4098-1191 PT Time Calculation (min): 33 min  PT Assessment / Plan / Recommendation Comments on Treatment Session  Pt assisted to bathroom and performed multiple sit to stands for hygiene while reviewing correct technique.  Pt reported dizziness while on toilet and BP checked once back to supine: 157/83 mmHg.    Follow Up Recommendations  Skilled nursing facility    Barriers to Discharge        Equipment Recommendations  Defer to next venue    Recommendations for Other Services    Frequency     Plan Discharge plan remains appropriate;Frequency remains appropriate    Precautions / Restrictions Precautions Precautions: Fall   Pertinent Vitals/Pain Pt reported pain back of head from previous fall but when asked upon leaving room reports no pain.    Mobility  Bed Mobility Bed Mobility: Supine to Sit;Sit to Supine Supine to Sit: 5: Supervision;HOB elevated;With rails Sit to Supine: 5: Supervision;HOB elevated Details for Bed Mobility Assistance: verbal cues for technique, supervision for feeding line Transfers Transfers: Sit to Stand;Stand to Sit Sit to Stand: 4: Min assist;From bed;With upper extremity assist;From chair/3-in-1 Stand to Sit: 3: Mod assist;To bed;With upper extremity assist;To chair/3-in-1 Details for Transfer Assistance: verbal and visual cues for safety and technique.  assist for weakness and uncontrolled descent. multiple sit to stands for hygiene and technique education Ambulation/Gait Ambulation/Gait Assistance: 3: Mod assist Ambulation Distance (Feet): 10 Feet (total) Assistive device: Rolling walker Ambulation/Gait Assistance Details: pt ambulated to bathroom with increased verbal cues for technique, safety and correct RW use, bed brought closer to bathroom door due to pt reporting dizziness once sitting on toilet, pt also reports  previous fall and now fearful of falling Gait Pattern: Trunk flexed;Step-through pattern;Decreased stride length;Wide base of support    Exercises     PT Diagnosis:    PT Problem List:   PT Treatment Interventions:     PT Goals Acute Rehab PT Goals PT Goal: Sit to Stand - Progress: Progressing toward goal PT Goal: Ambulate - Progress: Progressing toward goal  Visit Information  Last PT Received On: 12/25/11 Assistance Needed: +2    Subjective Data  Subjective: "I'm dizzy."   Cognition  Overall Cognitive Status: History of cognitive impairments - at baseline Arousal/Alertness: Awake/alert Orientation Level: Person Behavior During Session: WFL for tasks performed    Balance     End of Session PT - End of Session Equipment Utilized During Treatment: Gait belt Activity Tolerance: Other (comment) (pt reported dizziness) Patient left: in bed;with call bell/phone within reach   GP     [facility] 12/25/2011, 2:47 PM Pager: [phone]

## 2011-12-25 NOTE — Progress Notes (Addendum)
CRITICAL VALUE ALERT  Critical value received:  K 2.4  Date of notification:  6/28 Time of notification:  0446  Critical value read back:yes  Nurse who received alert:  Gwen yates, rn ; Sandria Bales, rn   MD notified (1st page):  Lenny Pastel, np   Time of first page:  0505  MD notified (2nd page):  Time of second page:  Responding MD:  Lenny Pastel   Time MD responded:  0515--also paged about magnesium being 1.3 after lab resulted.

## 2011-12-25 NOTE — Progress Notes (Signed)
CSW consulted with the doctor and was informed the patient was ready for Discharge. CSW consulted with the facility, [facility] and they were okay to take her back. CSW is awaiting discharge paper work.  [name]. [name] [phone]'

## 2011-12-25 NOTE — Progress Notes (Signed)
Patient around 2300 was found sitting on the floor with back up against the door. Bed alarm was not on. Patient assisted back to feet and back to bed. C/o back of head being sore and feeling dizzy. Paged NP. No new orders except to call if any neuro changes. Frequent vitals started. Spoke to patient's daughter [name] as well.

## 2011-12-25 NOTE — Procedures (Signed)
Successful image guided (L)UE DL PICC to brachial vein. Length 43cm, tip at SVC/RA Ready for use.  Brayton El PA-C 12/25/2011 5:07 PM

## 2011-12-25 NOTE — Discharge Summary (Signed)
HOSPITAL DISCHARGE SUMMARY  [name]  MRN: [id number]  DOB:06-04-1932  Date of Admission: 12/20/2011 Date of Discharge: 12/25/2011         LOS: 5 days   Attending Physician:  [name] A  Patient's PCP:  [name] Overman, MD  Consults: None  Discharge Diagnoses: Present on Admission:  .PICC line infection .DIABETES MELLITUS, TYPE II .Esophagitis, erosive .HYPERTENSION .Anemia .DVT (deep venous thrombosis) .C. 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 For pain      amLODipine 10 MG tablet   Commonly known as: NORVASC   10 mg by Feeding Tube route daily.      butalbital-acetaminophen-caffeine 50-325-40 MG per tablet   Commonly known as: FIORICET, ESGIC   2 tablets by Feeding Tube route every 8 (eight) hours as needed. For headache      cloNIDine 0.2 mg/24hr patch   Commonly known as: CATAPRES - Dosed in mg/24 hr   Place 1 patch (0.2 mg total) onto the skin once a week.      darifenacin 15 MG 24 hr tablet   Commonly known as: ENABLEX   15 mg by Feeding Tube route daily.      enoxaparin 80 MG/0.8ML injection   Commonly known as: LOVENOX   Inject 0.8 mLs (80 mg total) into the skin every 12 (twelve) hours.      FAT EMULSION IV   Inject 2 % into the vein continuous. With TPN      fentaNYL 50 MCG/HR   Commonly known as: DURAGESIC - dosed mcg/hr   Place 1 patch onto the skin every 3 (three) days.      insulin aspart 100 UNIT/ML injection   Commonly known as: novoLOG   Inject 3-5 Units into the skin 3 (three) times daily as needed. For cbg >150      metoprolol 50 MG tablet   Commonly known as: LOPRESSOR   50 mg by Feeding Tube route 2 (two) times daily.      metroNIDAZOLE 50 mg/ml oral suspension   Commonly known as: FLAGYL   Place  10 mLs (500 mg total) into feeding tube 3 (three) times daily.      ondansetron 4 MG tablet   Commonly known as: ZOFRAN   4 mg by Feeding Tube route every 8 (eight) hours as needed. For nausea      oxyCODONE 5 MG immediate release tablet   Commonly known as: Oxy IR/ROXICODONE   Place 5 mg into feeding tube every 4 (four) hours as needed. For pain      pantoprazole 40 MG tablet   Commonly known as: PROTONIX   40 mg by Feeding Tube route daily.      saccharomyces boulardii 250 MG capsule   Commonly known as: FLORASTOR   Take 250 mg by mouth 2 (two) times daily.      tpn solution (CLINIMIX E 5/15) 5 % Soln   Inject 93 mL/hr into the vein continuous.      traZODone 50 MG tablet   Commonly known as: DESYREL   Place 0.5-1 tablets (25-50 mg total) into feeding tube at bedtime as needed. For sleep      Vitamin D3 5000 UNITS Caps   Take 1  capsule by mouth once a week.             Brief Admission History: 78 year old female with past medical history as indicated below who presented from nursing home after having a fever associated with poor oral intake. Patient is poor historian due to history of dementia so history was mainly obtained from ED physician. At this time patient has no complaints of chest pain, no shortness of breath, no abdominal pain but there was 1 episode of non bloody vomiting.  Hospital Course: Present on Admission:  .PICC line infection .DIABETES MELLITUS, TYPE II .Esophagitis, erosive .HYPERTENSION .Anemia .DVT (deep venous thrombosis) .C. 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 C. difficile colitis: As mentioned above patient came in to the hospital because of fever, poor oral intake and upon initial evaluation in the emergency department it was thought her fever was secondary to PICC line-related infection. The PICC line was discontinued and the catheter tip was sent for culture. While she is in the hospital  here she was having a lot of diarrhea, this is was thought to be initially secondary to her gastroduodenal fistula, then her stool studies were sent and it came back positive for C. difficile by PCR. Other antibiotics were discontinued and patient was started on Flagyl 500 mg through the tube every 8 hours. Patient is going to have 10 more days of antibiotics as outpatient in her skilled nursing facility. Patient also is on fluoroscopy store.  2. Suspected PICC line-related bacteremia: Patient has chronic PICC line for TPN, at the time of admission it was the only suspected source of infection that can cause fever. Her procalcitonin was 26 she was started on vancomycin and Zosyn at the time of admission. The catheter tip grew about 30 colonies of coagulase-negative staph, for some reason blood culture were not done at the time of admission. I curbsided infectious disease service and [name] recommended not to use any antibiotics, as the fever is likely secondary to the C. difficile colitis. The patient returned to the hospital with the same problem patient should get blood cultures x2.  3. Diabetes mellitus type 2 this is been stable patient is on tube feeding, she is on TPN as well. She has reasonable CBGs during this hospital stay.  4. Esophagitis: Patient has history of erosive esophagitis patient is on Protonix, although this is a risk factor for C. difficile colitis but I could not discontinued because of history of severe erosive esophagitis which caused bleeding before.  5. Short gut syndrome: Per general surgery notes, patient did have history of perforation of prepyloric ulcer. Patient ended up with exploratory laparotomy with cholecystectomy, antrectomy and resection of the duodenal bulb, patient has Billroth II reconstruction with loop gastrojejunostomy, tube gastrostomy and jejunostomy. According to Dr. Michaell [name] notes the gastrojejunostomy functions as short gut syndrome, patient should be on  chronic TPN. She was seen by Dr. Michaell [name] on 11/26/2011 and he wanted to consider takedown of the gastroileostomy, but she needs cardiac clearance before that. This is the only way she can get off of the TPN and either start oral intake or she can be dependent on the tube feeding.   Day of Discharge BP 143/95  Pulse 104  Temp 98.6 F (37 C) (Oral)  Resp 18  Ht 5' 6.54" (1.69 m)  Wt 75.887 kg (167 lb 4.8 oz)  BMI 26.57 kg/m2  SpO2 99% Physical Exam:  General: Alert, disoriented not in  any acute distress.  HEENT: anicteric sclera, pupils equal reactive to light and accommodation  CVS: S1-S2 heard, no murmur rubs or gallops  Chest: clear to auscultation bilaterally, no wheezing rales or rhonchi  Abdomen: normal bowel sounds, soft, nontender, nondistended, no organomegaly  Neuro: Cranial nerves II-XII intact, no focal neurological deficits  Extremities: no cyanosis, no clubbing or edema noted bilaterally  Results for orders placed during the hospital encounter of 12/20/11 (from the past 24 hour(s))  GLUCOSE, CAPILLARY     Status: Abnormal   Collection Time   12/25/11  1:18 AM      Component Value Range   Glucose-Capillary 141 (*) 70 - 99 mg/dL   Comment 1 Notify RN     Comment 2 Documented in Chart    BASIC METABOLIC PANEL     Status: Abnormal   Collection Time   12/25/11  3:44 AM      Component Value Range   Sodium 134 (*) 135 - 145 mEq/L   Potassium 2.4 (*) 3.5 - 5.1 mEq/L   Chloride 102  96 - 112 mEq/L   CO2 22  19 - 32 mEq/L   Glucose, Bld 108 (*) 70 - 99 mg/dL   BUN 6  6 - 23 mg/dL   Creatinine, Ser 1.61  0.50 - 1.10 mg/dL   Calcium 8.2 (*) 8.4 - 10.5 mg/dL   GFR calc non Af Amer 89 (*) >90 mL/min   GFR calc Af Amer >90  >90 mL/min  CBC     Status: Abnormal   Collection Time   12/25/11  3:44 AM      Component Value Range   WBC 5.1  4.0 - 10.5 K/uL   RBC 3.77 (*) 3.87 - 5.11 MIL/uL   Hemoglobin 9.7 (*) 12.0 - 15.0 g/dL   HCT 09.6 (*) 04.5 - 40.9 %   MCV 79.0  78.0 - 100.0  fL   MCH 25.7 (*) 26.0 - 34.0 pg   MCHC 32.6  30.0 - 36.0 g/dL   RDW 81.1 (*) 91.4 - 78.2 %   Platelets 243  150 - 400 K/uL  MAGNESIUM     Status: Abnormal   Collection Time   12/25/11  3:44 AM      Component Value Range   Magnesium 1.3 (*) 1.5 - 2.5 mg/dL  GLUCOSE, CAPILLARY     Status: Abnormal   Collection Time   12/25/11  8:30 AM      Component Value Range   Glucose-Capillary 120 (*) 70 - 99 mg/dL   Comment 1 Documented in Chart     Comment 2 Notify RN    GLUCOSE, CAPILLARY     Status: Abnormal   Collection Time   12/25/11  1:03 PM      Component Value Range   Glucose-Capillary 119 (*) 70 - 99 mg/dL    Disposition: SNF   Follow-up Appts: Discharge Orders    Future Orders Please Complete By Expires   Increase activity slowly         Follow-up Information    Follow up with [name] Overman, MD in 1 week.   Contact information:   [address] [place] [address] [phone]          I spent 40 minutes completing paperwork and coordinating discharge efforts.  SignedClydia [name] A 12/25/2011, 3:14 PM

## 2011-12-25 NOTE — Progress Notes (Signed)
Report called to [facility] of [name]. [name]

## 2011-12-27 ENCOUNTER — Emergency Department (HOSPITAL_COMMUNITY): Payer: Medicare HMO

## 2011-12-27 ENCOUNTER — Encounter (HOSPITAL_COMMUNITY): Payer: Self-pay | Admitting: Nurse Practitioner

## 2011-12-27 ENCOUNTER — Emergency Department (HOSPITAL_COMMUNITY)
Admission: EM | Admit: 2011-12-27 | Discharge: 2011-12-27 | Disposition: A | Discharge status: Skilled Nursing Facility | Payer: Medicare HMO | Attending: Emergency Medicine | Admitting: Emergency Medicine

## 2011-12-27 ENCOUNTER — Encounter (HOSPITAL_COMMUNITY): Payer: Self-pay | Admitting: Emergency Medicine

## 2011-12-27 DIAGNOSIS — F3289 Other specified depressive episodes: Secondary | ICD-10-CM | POA: Insufficient documentation

## 2011-12-27 DIAGNOSIS — E119 Type 2 diabetes mellitus without complications: Secondary | ICD-10-CM | POA: Insufficient documentation

## 2011-12-27 DIAGNOSIS — F0390 Unspecified dementia without behavioral disturbance: Secondary | ICD-10-CM | POA: Insufficient documentation

## 2011-12-27 DIAGNOSIS — I10 Essential (primary) hypertension: Secondary | ICD-10-CM | POA: Insufficient documentation

## 2011-12-27 DIAGNOSIS — K9429 Other complications of gastrostomy: Secondary | ICD-10-CM

## 2011-12-27 DIAGNOSIS — F329 Major depressive disorder, single episode, unspecified: Secondary | ICD-10-CM | POA: Insufficient documentation

## 2011-12-27 DIAGNOSIS — K219 Gastro-esophageal reflux disease without esophagitis: Secondary | ICD-10-CM | POA: Insufficient documentation

## 2011-12-27 DIAGNOSIS — Z431 Encounter for attention to gastrostomy: Secondary | ICD-10-CM | POA: Insufficient documentation

## 2011-12-27 DIAGNOSIS — Z79899 Other long term (current) drug therapy: Secondary | ICD-10-CM | POA: Insufficient documentation

## 2011-12-27 HISTORY — DX: Urinary tract infection, site not specified: N39.0

## 2011-12-27 HISTORY — DX: Respiratory failure, unspecified, unspecified whether with hypoxia or hypercapnia: J96.90

## 2011-12-27 HISTORY — DX: Encephalitis and encephalomyelitis, unspecified: G04.90

## 2011-12-27 HISTORY — DX: Esophagitis, unspecified: K20.9

## 2011-12-27 HISTORY — DX: Other bacterial infections of unspecified site: A49.8

## 2011-12-27 HISTORY — DX: Esophagitis, unspecified without bleeding: K20.90

## 2011-12-27 HISTORY — DX: Bacteremia: R78.81

## 2011-12-27 MED ORDER — ACETAMINOPHEN 325 MG PO TABS
650.0000 mg | ORAL_TABLET | Freq: Once | ORAL | Status: AC
  Administered 2011-12-27: 650 mg
  Filled 2011-12-27: qty 2

## 2011-12-27 MED ORDER — LIDOCAINE HCL 2 % EX GEL
CUTANEOUS | Status: AC
  Filled 2011-12-27: qty 10

## 2011-12-27 NOTE — Discharge Instructions (Signed)
Gastrostomy Tube, Adult  A gastrostomy tube is a tube that is placed into the stomach. It is also called a "G-tube." This tube is used for:   Feeding.   Giving medication.  CLEANING THE G-TUBE SITE   Wash your hands with soap and water.   Remove the old dressing (if any). Some styles of G-tubes may need a dressing inserted between the skin and the G-tube. Other types of G-tubes do not require a dressing. Ask your caregiver if a dressing is needed.   Check the area where the tube enters the skin (insertion site) for redness, swelling, or pus-like (purulent) drainage. A small amount of clear or tan liquid drainage is normal. Check to make sure scar tissue (skin) is not growing around the insertion site. This could have a raised, bumpy appearance.   A cotton swab can be used to clean the skin around the tube:   When the G-tube is first put in, a normal saline solution or water can be used to clean the skin.   Mild soap and warm water can be used when the skin around the G-tube site has healed.   Roll the cotton swab around the G-tube insertion site to remove any drainage or crusting at the insertion site.  RESIDUALS  Feeding tube residuals are the amount of liquids that are in the stomach at any given time. Residuals may be checked before giving feedings, medications, or as instructed by your caregiver.   Ask your caregiver if there are instances when you would not start tube feedings depending on the amount or type of contents withdrawn from the stomach.   Check residuals by attaching a syringe to the G-tube and pull back on the syringe plunger. Note the amount and return the residual back into the stomach.  FLUSHING THE G-TUBE   The G-tube should be periodically flushed with clean warm water to keep it from clogging.   Flush the G-tube after feedings or medications. Draw up 30 mLs of warm water in a syringe. Connect the syringe to the G-tube and slowly push the water into the tube.   Do not push  feedings, medications, or flushes rapidly. Flush the G-tube gently and slowly.   Only use syringes made for G-tubes to flush medications or feedings.   Your caregiver may want the G-tube flushed more often or with more water. If this is the case, follow your caregiver's instructions.  FEEDINGS  Your caregiver will determine whether feedings are given as a bolus (a certain amount given at one time and at scheduled times) or whether feedings will be given continuously on a feeding pump.    Formulas should be given at room temperature.   If feedings are continuous, no more than 4 hours worth of feedings should be placed in the feeding bag. This helps prevent spoilage or accidental excess infusion.   Cover and place unused formula in the refrigerator.   If feedings are continuous, stop the feedings when medications or flushes are given. Be sure to restart the feedings.   Feeding bags and syringes should be replaced as instructed by your caregiver.  GIVING MEDICATION    In general, it is best if all medications are in a liquid form for G-tube administration. Liquid medications are less likely to clog the G-tube.   Mix the liquid medication with 30 mLs (or amount recommended by your caregiver) of warm water.   Draw up the medication into the syringe.   Attach the syringe to   the G-tube and slowly push the mixture into the G-tube.   After giving the medication, draw up 30 mLs of warm water in the syringe and slowly flush the G-tube.   For pills or capsules, check with your caregiver first before crushing medications. Some pills are not effective if they are crushed. Some capsules are sustained release medications.   If appropriate, crush the pill or capsule and mix with 30 mLs of warm water. Using the syringe, slowly push the medication through the tube, then flush the tube with another 30 mLs of tap water.  G-TUBE PROBLEMS  G-tube was pulled out.   Cause: May have been pulled out accidentally.   Solutions:  Cover the opening with clean dressing and tape. Call your caregiver right away. The G-tube should be put in as soon as possible (within 4 hours) so the G-tube opening (tract) does not close. The G-tube needs to be put in at a healthcare setting. An X-ray needs to be done to confirm placement before the G-tube can be used again.  Redness, irritation, soreness, or foul odor around the gastrostomy site.   Cause: May be caused by leakage or infection.   Solutions: Call your caregiver right away.  Large amount of leakage of fluid or mucus-like liquid present (a large amount means it soaks clothing).   Cause: Many reasons could cause the G-tube to leak.   Solutions: Call your caregiver to discuss the amount of leakage.  Skin or scar tissue appears to be growing where tube enters skin.    Cause: Tissue growth may develop around the insertion site if the G-tube is moved or pulled on excessively.   Solutions: Secure tube with tape so that excess movement does not occur. Call your caregiver.  G-tube is clogged.   Cause: Thick formula or medication.   Solutions: Try to slowly push warm water into the tube with a large syringe. Never try to push any object into the tube to unclog it. Do not force fluid into the G-tube. If you are unable to unclog the tube, call your caregiver right away.  TIPS   Head of Bed (HOB) position refers to the upright position of a person's upper body.   When giving medications or a feeding bolus, keep the HOB up as told by your caregiver. Do this during the feeding and for 1 hour after the feeding or medication administration.   If continuous feedings are being given, it is best to keep the HOB up as told by your caregiver. When ADLs (Activities of Daily Living) are performed and the HOB needs to be flat, be sure to turn the feeding pump off. Restart the feeding pump when the HOB is returned to the recommended height.   Do not pull or put tension on the tube.   To prevent fluid backflow,  kink the G-tube before removing the cap or disconnecting a syringe.   Check the G-tube length every day. Measure from the insertion site to the end of the G-tube. If the length is longer than previous measurements, the tube may be coming out. Call your caregiver if you notice increasing G-tube length.   Oral care, such as brushing teeth, must be continued.   You may need to remove excess air (vent) from the G-tube. Your caregiver will tell you if this is needed.   Always call your caregiver if you have questions or problems with the G-tube.  SEEK IMMEDIATE MEDICAL CARE IF:    You have severe   abdominal pain, tenderness, or abdominal bloating(distension).   You have nausea or vomiting.   You are constipated or have problems moving your bowels.   The G-tube insertion site is red, swollen, has a foul smell, or has yellow or brown drainage.   You have difficulty breathing or shortness of breath.   You have a fever.   You have a large amount of feeding tube residuals.   The G-tube is clogged and cannot be flushed.  MAKE SURE YOU:    Understand these instructions.   Will watch your condition.   Will get help right away if you are not doing well or get worse.  Document Released: 08/24/2001 Document Revised: 06/04/2011 Document Reviewed: 10/11/2007  [employer] Patient Information 2012 [employer]

## 2011-12-27 NOTE — ED Notes (Signed)
Per ems: pt from [facility] [address] after pulling g-tube out today. Pt denies any complaints. Pt alert and stable en route

## 2011-12-27 NOTE — ED Provider Notes (Addendum)
History     CSN: [id number]  Arrival date & time 12/27/11  1122   First MD Initiated Contact with Patient 12/27/11 1124      Chief Complaint  Patient presents with  . GI Problem    (Consider location/radiation/quality/duration/timing/severity/associated sxs/prior treatment) Patient is a 78 y.o. female presenting with GI illness. The history is provided by the patient, the EMS personnel and the nursing home.  GI Problem  This is a new problem. The current episode started less than 1 hour ago. Episode frequency: pulled out G-tube today. The problem has not changed since onset.Diarrhea characteristics: no abdominal pain. There has been no fever. Pertinent negatives include no abdominal pain, no chills, no URI and no cough. She has tried nothing for the symptoms. The treatment provided no relief. Her past medical history is significant for inflammatory bowel disease.    Past Medical History  Diagnosis Date  . Hypertension   . Reflux   . Right elbow pain     OTIF  . Dementia   . Depression   . Osteoarthritis   . Pancreatitis 11/2007    HOP  . Angiomyolipoma of kidney     right  . Ulcerative esophagitis 12/05/2007    hx elevated gastrin, severe on EGD by Dr Jena Gauss , h pylori negative  . Hiatal hernia   . S/P colonoscopy 2009    pt reports normal by Dr Lovell Sheehan  . Diabetes mellitus   . Anemia   . Hyponatremia   . Cellulitis   . Esophagitis   . Peptic ulcer   . Insomnia   . Sepsis   . Respiratory failure   . Sepsis   . Clostridium difficile infection   . Esophagitis   . Depression   . Encephalitis   . Urinary tract infection   . Bacteremia     Past Surgical History  Procedure Date  . Orif right hip 1999    [facility]  . Umbilical hernia repair 16 months old    [place]  . Esophagogastroduodenoscopy 01/28/2011    Procedure: ESOPHAGOGASTRODUODENOSCOPY (EGD);  Surgeon: Arlyce Harman, MD;  Location: [facility];  Service: Endoscopy;  Laterality: N/A;  . Colonoscopy 01/28/2011      Procedure: COLONOSCOPY;  Surgeon: Arlyce Harman, MD;  Location: [facility];  Service: Endoscopy;  Laterality: N/A;  . Laparotomy 02/04/2011    Procedure: EXPLORATORY LAPAROTOMY;  Surgeon: Fabio Bering;  Location: [facility] ORS;  Service: General;  Laterality: N/A;  . Laparotomy 05/07/2011    Procedure: EXPLORATORY LAPAROTOMY;  Surgeon: Ardeth Sportsman, MD;  Location: [facility] OR;  Service: General;  Laterality: N/A;  lysis of adhesions  . Gastrostomy 05/07/2011    Procedure: GASTROSTOMY;  Surgeon: Ardeth Sportsman, MD;  Location[facility] OR;  Service: General;  Laterality: N/A;  g-tube / j -tube placement  . Gastrectomy 05/07/2011    Procedure: GASTRECTOMY;  Surgeon: Ardeth Sportsman, MD;  Location: [facility] OR;  Service: General;  Laterality: N/A;  Partial gastrectomy  . Cholecystectomy 05/07/2011    Procedure: CHOLECYSTECTOMY;  Surgeon: Ardeth Sportsman, MD;  Location: [facility] OR;  Service: General;  Laterality: N/A;  open  . Tee without cardioversion 10/21/2011    Procedure: TRANSESOPHAGEAL ECHOCARDIOGRAM (TEE);  Surgeon: Wendall Stade, MD;  Location: [facility] ENDOSCOPY;  Service: Cardiovascular;  Laterality: N/A;    Family History  Problem Relation Age of Onset  . Cancer Mother     pelvic     History  Substance Use Topics  .  Smoking status: Never Smoker   . Smokeless tobacco: Current User    Types: Chew   Comment: taken away while she is a SNF  . Alcohol Use: No     Hx of Alcohol dependecy     OB History    Grav Para Term Preterm Abortions TAB SAB Ect Mult Living                  Review of Systems  Constitutional: Negative for chills.  Respiratory: Negative for cough.   Gastrointestinal: Negative for abdominal pain.  All other systems reviewed and are negative.    Allergies  Other and Penicillins  Home Medications   Current Outpatient Rx  Name Route Sig Dispense Refill  . ACETAMINOPHEN 500 MG PO TABS Feeding Tube 500 mg by Feeding Tube route daily as needed. For pain    . CLINIMIX E/DEXTROSE  (5/15) 5 % IV SOLN Intravenous Inject 93 mL/hr into the vein continuous.    . AMLODIPINE BESYLATE 10 MG PO TABS Feeding Tube 10 mg by Feeding Tube route daily.    Marland Kitchen BUTALBITAL-APAP-CAFFEINE 50-325-40 MG PO TABS Feeding Tube 2 tablets by Feeding Tube route every 8 (eight) hours as needed. For headache    . VITAMIN D3 5000 UNITS PO CAPS Oral Take 1 capsule by mouth once a week.    Marland Kitchen CLONIDINE HCL 0.2 MG/24HR TD PTWK Transdermal Place 1 patch (0.2 mg total) onto the skin once a week. 4 patch   . DARIFENACIN HYDROBROMIDE ER 15 MG PO TB24 Feeding Tube 15 mg by Feeding Tube route daily.     Marland Kitchen ENOXAPARIN SODIUM 80 MG/0.8ML [place] SOLN Subcutaneous Inject 0.8 mLs (80 mg total) into the skin every 12 (twelve) hours. 22.4 Syringe   . FAT EMULSION IV Intravenous Inject 2 % into the vein continuous. With TPN    . FENTANYL 50 MCG/HR TD PT72 Transdermal Place 1 patch onto the skin every 3 (three) days.    . INSULIN ASPART 100 UNIT/ML [place] SOLN Subcutaneous Inject 3-5 Units into the skin 3 (three) times daily as needed. For cbg >150    . METOPROLOL TARTRATE 50 MG PO TABS Feeding Tube 50 mg by Feeding Tube route 2 (two) times daily.    Marland Kitchen METRONIDAZOLE 50 MG/ML ORAL SUSPENSION Per Tube Place 10 mLs (500 mg total) into feeding tube 3 (three) times daily.    Marland Kitchen ONDANSETRON HCL 4 MG PO TABS Feeding Tube 4 mg by Feeding Tube route every 8 (eight) hours as needed. For nausea    . OXYCODONE HCL 5 MG PO TABS Per Tube Place 5 mg into feeding tube every 4 (four) hours as needed. For pain    . PANTOPRAZOLE SODIUM 40 MG PO TBEC Feeding Tube 40 mg by Feeding Tube route daily.    Marland Kitchen SACCHAROMYCES BOULARDII 250 MG PO CAPS Oral Take 250 mg by mouth 2 (two) times daily.    . TRAZODONE HCL 50 MG PO TABS Per Tube Place 0.5-1 tablets (25-50 mg total) into feeding tube at bedtime as needed. For sleep      BP 112/62  Pulse 105  Temp 98.3 F (36.8 C) (Oral)  Resp 20  SpO2 97%  Physical Exam  Nursing note and vitals  reviewed. Constitutional: She appears well-developed and well-nourished. No distress.  HENT:  Head: Normocephalic and atraumatic.  Eyes: EOM are normal. Pupils are equal, round, and reactive to light.  Cardiovascular: Normal rate, regular rhythm, normal heart sounds and intact distal pulses.  Exam reveals no friction rub.   No murmur heard. Pulmonary/Chest: Effort normal and breath sounds normal. She has no wheezes. She has no rales.  Abdominal: Soft. Bowel sounds are normal. She exhibits no distension. There is no tenderness. There is no rebound and no guarding.       g-tube site well appearing and non-erythematous.  Healed surgical scar down middle of the abdomen with small area of opening in the upper abd with dressing in place.  Musculoskeletal: Normal range of motion. She exhibits no tenderness.       No edema  Neurological: She is alert.  Skin: Skin is warm and dry. No rash noted.  Psychiatric: She has a normal mood and affect. Her behavior is normal.    ED Course  Gastrostomy tube replacement Date/Time: 12/27/2011 12:13 PM Performed by: Gwyneth Sprout Authorized by: Gwyneth Sprout Consent: Verbal consent obtained. Local anesthesia used: no Patient sedated: no Patient tolerance: Patient tolerated the procedure well with no immediate complications. Comments: 52f g-tube placed without difficulty   (including critical care time)  Labs Reviewed - No data to display Dg Abd 1 View  12/27/2011  *RADIOLOGY REPORT*  Clinical Data: Confirmed G-tube placement.  ABDOMEN - 1 VIEW  Comparison: No priors.  Findings: A single radiograph of the abdomen was obtained after infusion of 30 ml of Omnipaque-300 into the patient's percutaneous enterogastric tube.  The injected contrast opacifies the stomach. Gas and stool is seen scattered throughout the colon extending to the level of the rectum.  No pathologic distension of small bowel is noted.  Multiple surgical clips and coils project over  the upper abdomen.  IMPRESSION:  1.  Tip of the percutaneous enterogastric tube is within the lumen of the stomach.  Original Report Authenticated By: Florencia Reasons, M.D.   Ir Fluoro Guide Cv Line Left  12/25/2011  *RADIOLOGY REPORT*  Clinical Data: Chronic TPN and needs new PICC line  PICC LINE PLACEMENT WITH ULTRASOUND AND FLUOROSCOPIC  GUIDANCE  Fluoroscopy Time: 0.6 minutes.  The left arm was prepped with chlorhexidine, draped in the usual sterile fashion using maximum barrier technique (cap and mask, sterile gown, sterile gloves, large sterile sheet, hand hygiene and cutaneous antisepsis) and infiltrated locally with 1% Lidocaine.  Ultrasound demonstrated patency of the left brachial vein, and this was documented with an image.  Under real-time ultrasound guidance, this vein was accessed with a 21 gauge micropuncture needle and image documentation was performed.  The needle was exchanged over a guidewire for a peel-away sheath through which a five [place] dual lumen PICC trimmed to 43 cm was advanced, positioned with its tip at the lower SVC/right atrial junction.  Fluoroscopy during the procedure and fluoro spot radiograph confirms appropriate catheter position.  The catheter was flushed, secured to the skin with Prolene sutures, and covered with a sterile dressing.  Complications:  None  IMPRESSION: Successful left arm PICC line placement with ultrasound and fluoroscopic guidance.  The catheter is ready for use.  Original Report Authenticated By: Richarda Overlie, M.D.   Ir US Guide Vasc Access Left  12/25/2011  *RADIOLOGY REPORT*  Clinical Data: Chronic TPN and needs new PICC line  PICC LINE PLACEMENT WITH ULTRASOUND AND FLUOROSCOPIC  GUIDANCE  Fluoroscopy Time: 0.6 minutes.  The left arm was prepped with chlorhexidine, draped in the usual sterile fashion using maximum barrier technique (cap and mask, sterile gown, sterile gloves, large sterile sheet, hand hygiene and cutaneous antisepsis) and infiltrated  locally with 1% Lidocaine.  Ultrasound  demonstrated patency of the left brachial vein, and this was documented with an image.  Under real-time ultrasound guidance, this vein was accessed with a 21 gauge micropuncture needle and image documentation was performed.  The needle was exchanged over a guidewire for a peel-away sheath through which a five [place] dual lumen PICC trimmed to 43 cm was advanced, positioned with its tip at the lower SVC/right atrial junction.  Fluoroscopy during the procedure and fluoro spot radiograph confirms appropriate catheter position.  The catheter was flushed, secured to the skin with Prolene sutures, and covered with a sterile dressing.  Complications:  None  IMPRESSION: Successful left arm PICC line placement with ultrasound and fluoroscopic guidance.  The catheter is ready for use.  Original Report Authenticated By: Richarda Overlie, M.D.     1. Dislodged gastrostomy tube       MDM   Patient here because she pulled her G-tube out today. Patient is not complaining of any abnormalities other than having a mild headache. Her G-tube without the site is clean and intact the patient has no abdominal pain.      Will replace the G-tube in discharge patient back to the nursing facility  Plain film confirms placement and pt d/ced home.     Gwyneth Sprout, MD 12/27/11 1307  Gwyneth Sprout, MD 12/27/11 1309

## 2011-12-27 NOTE — ED Notes (Signed)
Called [facility] to transport back to [facility]

## 2011-12-27 NOTE — ED Notes (Signed)
Pt from [facility] w/ dislodged G-tube. Pt at [facility] earlier today for same

## 2011-12-27 NOTE — Discharge Instructions (Signed)
Unfortunately the feeding tube site seems to have become constricted. We were unable to replace the tube appeared in the emergency department. This procedure will need to be done by interventional radiology. They will be available on Monday.  Care of a Feeding Tube Site Individuals who have trouble swallowing or cannot take food or medication by mouth are sometimes given feeding tubes. A feeding tube can go into the nose and down to the stomach or through the skin in the abdomen and into the stomach or small bowel. Some of the names of these feeding tubes are gastrostomy tubes, PEG lines, nasogastric tubes, and gastrojejunostomy tubes.  Liquids or foods that have been made into a thick, smooth soup consistency (pureed), along with medications, may be given through the tube. There are several ways to give the liquid food (formula), and there are several kinds of prescribed formulas. Your caregiver will arrange for you to get nutrition in the right amounts for you. EQUIPMENT NEEDED FOR TUBE FEEDING  A 60 mL syringe.   Formula suggested by your caregiver or dietician.   A specific plug to put into the feeding tube tip during the times when you are not getting food or medications.   A feeding pump or a place to hang your food container (an IV pole or a wall hook) so it can operate by gravity while you are getting your feedings. You attach the tube from the food container to the end of the feeding tube. Your caregivers will help you with these supplies or tell you where to get them.  PROCEDURE FOR TUBE FEEDING 1. Wash your hands before touching the equipment, food, medications, or the site (where the tube enters your body).  2. Check the tube placement before starting the feeding by removing the plug in the end of the feeding tube and attaching a syringe to the feeding tube. Pull the plunger back and you will usually see some yellowish or greenish fluid. This tells you the tube is in the correct place.  Note the amount and gently push the residual back into the tube.  Ask your caregiver if there are instances when you would not start tube feedings depending on the amount or type of contents withdrawn from the stomach.   If gastrointestinal (GI) contents do not show when you pull back on the plunger, measure the length from the stoma site (the opening in your body made for feeding) to the end of your feeding tube. If this different from the previous measurement, you should call your caregiver.   If at any point the feeding tube seems blocked, use your syringe and pull back on the plunger. If this does not work, try to gently force some warm water through the tube. If none of these methods work, call your caregiver. It is important not to miss your medications, food, or water.  3. If everything with the tube seems to be okay, insert the tip of the tube from the food container into your feeding tube.  4. While sitting up or with your head propped up at least 30 degrees to 45 degrees, start the feeding. If you develop coughing or have trouble breathing, stop the feeding immediately.  5. Administer the feeding for the length of time instructed by your caregiver.  6. To keep the tube open, following the feeding, flush the tube with 30 mLs of water using a syringe, and replace the plug at the end of the feeding tube.  7. Do not leave  unused food out between feedings. Refrigerate or store it as directed.  GIVING YOUR MEDICATIONS THROUGH THE TUBE  Use liquid forms of your medications, if available.   Some pills or tablets may be crushed and put into warm water. Do not use hot water, which could affect the contents.   Ask the pharmacist if you can crush your pills. Do not crush capsules that have SR (sustained release), XR (extended release), or CD (controlled release) printed on them unless your caregiver or pharmacist says it is okay.   After you have crushed your pills or capsules into small pieces or  a powder, let the pieces dissolve in warm (not hot) water so that no pieces will clog your tube. Draw medication up into your syringe by pulling back on the plunger.   Attach the syringe to the end of the feeding tube and push on the plunger to give your medication.   Flush the tube with 30 mLs of water after giving your medication. This makes sure you have received all of your medications.  CARE OF THE SKIN AROUND THE ENTRANCE OF THE TUBE  Check the skin daily where the tube enters the abdomen for redness, irritation, drainage, or tenderness.   Clean around the tube daily with water or soap and water using cotton-tipped applicators or gauze squares. Dry completely after cleaning.   Change the dressing around the tube insertion site daily or as instructed.   Apply antibiotic ointment around the site, if directed by your caregiver.   If the dressing becomes wet or soiled, change it as soon as convenient.   You may use tape to fasten your feeding tube to your skin for comfort or do as directed.  SEEK IMMEDIATE MEDICAL CARE IF:   You develop coughing or have trouble breathing during a feeding. Stop the feeding and call your caregiver immediately.   You notice swelling, redness, drainage, or tenderness at the tube insertion site.   You develop pain,nausea, vomiting, diarrhea, constipation, or bleeding around the tube insertion site.   The tube seems plugged and you are unable to get water through the tube.   There is food leaking from around the tube.   The tube falls out.  Document Released: 06/15/2005 Document Revised: 06/04/2011 Document Reviewed: 09/10/2006 [facility] Patient Information 2012 [place], [place].

## 2011-12-27 NOTE — ED Notes (Signed)
[facility] here to take pt back to [facility].

## 2011-12-27 NOTE — ED Provider Notes (Signed)
History     CSN: [id number]  Arrival date & time 12/27/11  1652   First MD Initiated Contact with Patient 12/27/11 1749      Chief Complaint  Patient presents with  . GI Problem    (Consider location/radiation/quality/duration/timing/severity/associated sxs/prior treatment) HPI The patient presents to the emergency room because of dislodged G-tube. Patient apparently has had a G-tube for approximately 2 weeks. She was seen in the emergency department earlier today 1122. The tube was accidentally pulled out earlier in the day and replaced at [facility]. Unfortunately the tube became dislodged again .  Facility was concerned that the balloon may have been deflated. Past Medical History  Diagnosis Date  . Hypertension   . Reflux   . Right elbow pain     OTIF  . Dementia   . Depression   . Osteoarthritis   . Pancreatitis 11/2007    HOP  . Angiomyolipoma of kidney     right  . Ulcerative esophagitis 12/05/2007    hx elevated gastrin, severe on EGD by Dr Jena Gauss , h pylori negative  . Hiatal hernia   . S/P colonoscopy 2009    pt reports normal by Dr Lovell Sheehan  . Diabetes mellitus   . Anemia   . Hyponatremia   . Cellulitis   . Esophagitis   . Peptic ulcer   . Insomnia   . Sepsis   . Respiratory failure   . Sepsis   . Clostridium difficile infection   . Esophagitis   . Depression   . Encephalitis   . Urinary tract infection   . Bacteremia     Past Surgical History  Procedure Date  . Orif right hip 1999    [facility]  . Umbilical hernia repair 51 months old    [place]  . Esophagogastroduodenoscopy 01/28/2011    Procedure: ESOPHAGOGASTRODUODENOSCOPY (EGD);  Surgeon: Arlyce Harman, MD;  [facility];  Service: Endoscopy;  Laterality: N/A;  . Colonoscopy 01/28/2011    Procedure: COLONOSCOPY;  Surgeon: Arlyce Harman, MD;  [facility];  Service: Endoscopy;  Laterality: N/A;  . Laparotomy 02/04/2011    Procedure: EXPLORATORY LAPAROTOMY;  Surgeon: Fabio Bering;  Location: [facility] ORS;  Service: General;  Laterality: N/A;  . Laparotomy 05/07/2011    Procedure: EXPLORATORY LAPAROTOMY;  Surgeon: Ardeth Sportsman, MD;  Location: [facility] OR;  Service: General;  Laterality: N/A;  lysis of adhesions  . Gastrostomy 05/07/2011    Procedure: GASTROSTOMY;  Surgeon: Ardeth Sportsman, MD;  Location: [facility] OR;  Service: General;  Laterality: N/A;  g-tube / j -tube placement  . Gastrectomy 05/07/2011    Procedure: GASTRECTOMY;  Surgeon: Ardeth Sportsman, MD;  Location: [facility] OR;  Service: General;  Laterality: N/A;  Partial gastrectomy  . Cholecystectomy 05/07/2011    Procedure: CHOLECYSTECTOMY;  Surgeon: Ardeth Sportsman, MD;  Location: [facility] OR;  Service: General;  Laterality: N/A;  open  . Tee without cardioversion 10/21/2011    Procedure: TRANSESOPHAGEAL ECHOCARDIOGRAM (TEE);  Surgeon: Wendall Stade, MD;  Location: [facility] ENDOSCOPY;  Service: Cardiovascular;  Laterality: N/A;    Family History  Problem Relation Age of Onset  . Cancer Mother     pelvic     History  Substance Use Topics  . Smoking status: Never Smoker   . Smokeless tobacco: Current User    Types: Chew   Comment: taken away while she is a SNF  . Alcohol Use: No     Hx of  Alcohol dependecy     OB History    Grav Para Term Preterm Abortions TAB SAB Ect Mult Living                  Review of Systems  Constitutional: Negative for fever.  Respiratory: Negative for cough.   All other systems reviewed and are negative.    Allergies  Other and Penicillins  Home Medications   Current Outpatient Rx  Name Route Sig Dispense Refill  . ACETAMINOPHEN 500 MG PO TABS Feeding Tube 500 mg by Feeding Tube route daily as needed. For pain    . AMLODIPINE BESYLATE 10 MG PO TABS Feeding Tube 10 mg by Feeding Tube route daily.    Marland Kitchen BUTALBITAL-APAP-CAFFEINE 50-325-40 MG PO TABS Feeding Tube 2 tablets by Feeding Tube route every 8 (eight) hours as needed. For headache    . VITAMIN D3 5000 UNITS PO CAPS Oral Take 1  capsule by mouth once a week.    Marland Kitchen CLONIDINE HCL 0.2 MG/24HR TD PTWK Transdermal Place 1 patch onto the skin once a week. On monday    . DARIFENACIN HYDROBROMIDE ER 15 MG PO TB24 Feeding Tube 15 mg by Feeding Tube route daily.     Marland Kitchen ENOXAPARIN SODIUM 80 MG/0.8ML [place] SOLN Subcutaneous Inject 0.8 mLs (80 mg total) into the skin every 12 (twelve) hours. 22.4 Syringe   . FENTANYL 50 MCG/HR TD PT72 Transdermal Place 1 patch onto the skin every 3 (three) days.    . INSULIN ASPART 100 UNIT/ML [place] SOLN Subcutaneous Inject 5 Units into the skin 3 (three) times daily as needed. For cbg >150    . METOPROLOL TARTRATE 50 MG PO TABS Feeding Tube 50 mg by Feeding Tube route 2 (two) times daily.    Marland Kitchen METRONIDAZOLE 50 MG/ML ORAL SUSPENSION Per Tube Place 10 mLs (500 mg total) into feeding tube 3 (three) times daily.    Marland Kitchen JEVITY 1.2 CAL PO LIQD Per Tube Place 1,000 mLs into feeding tube continuous.    Marland Kitchen ONDANSETRON HCL 4 MG/5ML PO SOLN Oral Take 4 mg by mouth every 8 (eight) hours as needed. For nausea    . OXYCODONE HCL 5 MG PO TABS Per Tube Place 5 mg into feeding tube every 4 (four) hours as needed. For pain    . PANTOPRAZOLE SODIUM 40 MG PO TBEC Feeding Tube 40 mg by Feeding Tube route daily.    Marland Kitchen SACCHAROMYCES BOULARDII 250 MG PO CAPS Feeding Tube 250 mg by Feeding Tube route 2 (two) times daily.     . TRAZODONE HCL 50 MG PO TABS Per Tube Place 50 mg into feeding tube at bedtime as needed. For sleep    . TUBERCULIN PPD 5 UNIT/0.1ML [place] SOLN Intradermal Inject 5 Units into the skin every evening.      BP 125/64  Pulse 92  Temp 98.6 F (37 C) (Oral)  SpO2 96%  Physical Exam  Nursing note and vitals reviewed. Constitutional: She appears well-developed and well-nourished. No distress.  HENT:  Head: Normocephalic and atraumatic.  Right Ear: External ear normal.  Left Ear: External ear normal.  Eyes: Conjunctivae are normal. Right eye exhibits no discharge. Left eye exhibits no discharge. No scleral icterus.   Neck: Neck supple. No tracheal deviation present.  Cardiovascular: Normal rate.   Pulmonary/Chest: Effort normal. No stridor. No respiratory distress.  Abdominal: She exhibits no distension. There is no tenderness.       G-tube ostomy site without erythema  Musculoskeletal: She  exhibits no edema.  Neurological: She is alert. Cranial nerve deficit: no gross deficits.  Skin: Skin is warm and dry. No rash noted.  Psychiatric: She has a normal mood and affect.    ED Course  Procedures (including critical care time) I attempted to place a feeding tube. 24 [place]. There was resistance. There is a mild amount of bleeding from the granulation tissue around the site. I made a second attempt with a 20 French Foley catheter but was still unable to pass the tube easily. Patient experienced some discomfort with this.  The procedure was discontinued at this point. Labs Reviewed - No data to display Dg Abd 1 View  12/27/2011  *RADIOLOGY REPORT*  Clinical Data: Confirmed G-tube placement.  ABDOMEN - 1 VIEW  Comparison: No priors.  Findings: A single radiograph of the abdomen was obtained after infusion of 30 ml of Omnipaque-300 into the patient's percutaneous enterogastric tube.  The injected contrast opacifies the stomach. Gas and stool is seen scattered throughout the colon extending to the level of the rectum.  No pathologic distension of small bowel is noted.  Multiple surgical clips and coils project over the upper abdomen.  IMPRESSION:  1.  Tip of the percutaneous enterogastric tube is within the lumen of the stomach.  Original Report Authenticated By: Florencia Reasons, M.D.     1. Gastric tube granulation tissue       MDM  Was unsuccessful at replacing the feeding tube. I will make arrangements for interventional radiology to place the tube as an outpatient. First thing tomorrow morning        Celene Kras, MD 12/27/11 [phone]

## 2011-12-27 NOTE — ED Notes (Signed)
Called [facility] to take pt back to [facility].

## 2011-12-28 ENCOUNTER — Ambulatory Visit (HOSPITAL_COMMUNITY)
Admission: RE | Admit: 2011-12-28 | Discharge: 2011-12-28 | Disposition: A | Discharge status: Home / Self Care | Payer: Medicare HMO | Source: Ambulatory Visit | Attending: Emergency Medicine | Admitting: Emergency Medicine

## 2011-12-28 ENCOUNTER — Encounter (INDEPENDENT_AMBULATORY_CARE_PROVIDER_SITE_OTHER): Payer: Self-pay | Admitting: Surgery

## 2011-12-28 ENCOUNTER — Other Ambulatory Visit (HOSPITAL_COMMUNITY): Payer: Self-pay | Admitting: Emergency Medicine

## 2011-12-28 DIAGNOSIS — Z431 Encounter for attention to gastrostomy: Secondary | ICD-10-CM | POA: Insufficient documentation

## 2011-12-28 DIAGNOSIS — R1310 Dysphagia, unspecified: Secondary | ICD-10-CM

## 2011-12-28 DIAGNOSIS — R633 Feeding difficulties: Secondary | ICD-10-CM

## 2011-12-28 MED ORDER — IOHEXOL 300 MG/ML  SOLN
50.0000 mL | Freq: Once | INTRAMUSCULAR | Status: AC | PRN
  Administered 2011-12-28: 10 mL

## 2011-12-28 NOTE — Procedures (Signed)
Fluoro replacement of an 106fr Gtube No comp Confirmed in stomach Ready for use Full report in PACS

## 2011-12-29 NOTE — Progress Notes (Signed)
Pt for D/C today to   [facility]    . Plan transport via EMS. Pt and family are agreeable to plans.  Elisabel Hanover C. Haleema Vanderheyden, LCSWA [phone]   

## 2011-12-29 NOTE — Progress Notes (Signed)
Pt for D/C today to   [facility]    . Plan transport via EMS. Pt and family are agreeable to plans.  [name]. [name] [phone]

## 2011-12-29 NOTE — Progress Notes (Signed)
Clinical Social Work Department CLINICAL SOCIAL WORK PLACEMENT NOTE 12/29/2011  Patient:  [name], [name]  Account Number:  [id number] Admit date:  12/20/2011  Clinical Social Worker:  Tommi Emery, CLINICAL SOCIAL WORKER  Date/time:  01/19/2012 07:57 AM  Clinical Social Work is seeking post-discharge placement for this patient at the following level of care:   SKILLED NURSING   (*CSW will update this form in Epic as items are completed)   01/19/2012  Patient/family provided with [facility] Department of Clinical Social Work's list of facilities offering this level of care within the geographic area requested by the patient (or if unable, by the patient's family).  01/19/2012  Patient/family informed of their freedom to choose among providers that offer the needed level of care, that participate in Medicare, Medicaid or managed care program needed by the patient, have an available bed and are willing to accept the patient.  01/19/2012  Patient/family informed of [facility]' ownership interest in [facility], as well as of the fact that they are under no obligation to receive care at this facility.  PASARR submitted to EDS on 01/19/2012 PASARR number received from EDS on 01/19/2012  FL2 transmitted to all facilities in geographic area requested by pt/family on  01/19/2012 FL2 transmitted to all facilities within larger geographic area on 01/19/2012  Patient informed that his/her managed care company has contracts with or will negotiate with  certain facilities, including the following:     Patient/family informed of bed offers received:  01/19/2012 Patient chooses bed at [facility] [place] Physician recommends and patient chooses bed at  [facility] [place]  Patient to be transferred to [facility] [place] on  01/22/2012 Patient to be transferred to facility by [facility]  The following physician request were entered in  Epic:   Additional Comments:  Chamika Cunanan C. [name] [phone]

## 2012-01-08 ENCOUNTER — Emergency Department (HOSPITAL_COMMUNITY): Payer: Medicare HMO

## 2012-01-08 ENCOUNTER — Inpatient Hospital Stay (HOSPITAL_COMMUNITY)
Admission: EM | Admit: 2012-01-08 | Discharge: 2012-01-15 | DRG: 871 | Disposition: A | Discharge status: Skilled Nursing Facility | Payer: Medicare HMO | Attending: Internal Medicine | Admitting: Internal Medicine

## 2012-01-08 ENCOUNTER — Encounter (HOSPITAL_COMMUNITY): Payer: Self-pay | Admitting: *Deleted

## 2012-01-08 ENCOUNTER — Other Ambulatory Visit: Payer: Self-pay

## 2012-01-08 DIAGNOSIS — B961 Klebsiella pneumoniae [K. pneumoniae] as the cause of diseases classified elsewhere: Secondary | ICD-10-CM | POA: Diagnosis present

## 2012-01-08 DIAGNOSIS — E785 Hyperlipidemia, unspecified: Secondary | ICD-10-CM | POA: Diagnosis present

## 2012-01-08 DIAGNOSIS — Y95 Nosocomial condition: Secondary | ICD-10-CM

## 2012-01-08 DIAGNOSIS — K316 Fistula of stomach and duodenum: Secondary | ICD-10-CM | POA: Diagnosis present

## 2012-01-08 DIAGNOSIS — F068 Other specified mental disorders due to known physiological condition: Secondary | ICD-10-CM | POA: Diagnosis present

## 2012-01-08 DIAGNOSIS — R7881 Bacteremia: Principal | ICD-10-CM | POA: Diagnosis present

## 2012-01-08 DIAGNOSIS — E876 Hypokalemia: Secondary | ICD-10-CM | POA: Diagnosis not present

## 2012-01-08 DIAGNOSIS — Z66 Do not resuscitate: Secondary | ICD-10-CM | POA: Diagnosis present

## 2012-01-08 DIAGNOSIS — Z880 Allergy status to penicillin: Secondary | ICD-10-CM

## 2012-01-08 DIAGNOSIS — R0902 Hypoxemia: Secondary | ICD-10-CM

## 2012-01-08 DIAGNOSIS — D649 Anemia, unspecified: Secondary | ICD-10-CM | POA: Diagnosis present

## 2012-01-08 DIAGNOSIS — A0472 Enterocolitis due to Clostridium difficile, not specified as recurrent: Secondary | ICD-10-CM

## 2012-01-08 DIAGNOSIS — Z794 Long term (current) use of insulin: Secondary | ICD-10-CM

## 2012-01-08 DIAGNOSIS — R509 Fever, unspecified: Secondary | ICD-10-CM

## 2012-01-08 DIAGNOSIS — F329 Major depressive disorder, single episode, unspecified: Secondary | ICD-10-CM | POA: Diagnosis present

## 2012-01-08 DIAGNOSIS — F3289 Other specified depressive episodes: Secondary | ICD-10-CM | POA: Diagnosis present

## 2012-01-08 DIAGNOSIS — R7989 Other specified abnormal findings of blood chemistry: Secondary | ICD-10-CM

## 2012-01-08 DIAGNOSIS — Z931 Gastrostomy status: Secondary | ICD-10-CM

## 2012-01-08 DIAGNOSIS — J189 Pneumonia, unspecified organism: Secondary | ICD-10-CM | POA: Diagnosis present

## 2012-01-08 DIAGNOSIS — Z79899 Other long term (current) drug therapy: Secondary | ICD-10-CM

## 2012-01-08 DIAGNOSIS — E119 Type 2 diabetes mellitus without complications: Secondary | ICD-10-CM | POA: Diagnosis present

## 2012-01-08 DIAGNOSIS — F0390 Unspecified dementia without behavioral disturbance: Secondary | ICD-10-CM | POA: Diagnosis present

## 2012-01-08 DIAGNOSIS — Z903 Acquired absence of stomach [part of]: Secondary | ICD-10-CM

## 2012-01-08 DIAGNOSIS — I10 Essential (primary) hypertension: Secondary | ICD-10-CM | POA: Diagnosis present

## 2012-01-08 DIAGNOSIS — B9681 Helicobacter pylori [H. pylori] as the cause of diseases classified elsewhere: Secondary | ICD-10-CM | POA: Diagnosis present

## 2012-01-08 DIAGNOSIS — Z8719 Personal history of other diseases of the digestive system: Secondary | ICD-10-CM

## 2012-01-08 DIAGNOSIS — K912 Postsurgical malabsorption, not elsewhere classified: Secondary | ICD-10-CM | POA: Diagnosis present

## 2012-01-08 LAB — URINALYSIS, ROUTINE W REFLEX MICROSCOPIC
Bilirubin Urine: NEGATIVE
Glucose, UA: NEGATIVE mg/dL
Hgb urine dipstick: NEGATIVE
Ketones, ur: NEGATIVE mg/dL
Leukocytes, UA: NEGATIVE
Nitrite: NEGATIVE
Protein, ur: 100 mg/dL — AB
Specific Gravity, Urine: 1.021 (ref 1.005–1.030)
Urobilinogen, UA: 0.2 mg/dL (ref 0.0–1.0)
pH: 6 (ref 5.0–8.0)

## 2012-01-08 LAB — COMPREHENSIVE METABOLIC PANEL
ALT: 22 U/L (ref 0–35)
AST: 27 U/L (ref 0–37)
Albumin: 3 g/dL — ABNORMAL LOW (ref 3.5–5.2)
Alkaline Phosphatase: 167 U/L — ABNORMAL HIGH (ref 39–117)
BUN: 18 mg/dL (ref 6–23)
CO2: 21 mEq/L (ref 19–32)
Calcium: 8.8 mg/dL (ref 8.4–10.5)
Chloride: 98 mEq/L (ref 96–112)
Creatinine, Ser: 0.63 mg/dL (ref 0.50–1.10)
GFR calc Af Amer: >90 mL/min (ref >90)
GFR calc non Af Amer: 84 mL/min — ABNORMAL LOW (ref >90)
Glucose, Bld: 133 mg/dL — ABNORMAL HIGH (ref 70–99)
Potassium: 3.9 mEq/L (ref 3.5–5.1)
Sodium: 131 mEq/L — ABNORMAL LOW (ref 135–145)
Total Bilirubin: 0.2 mg/dL — ABNORMAL LOW (ref 0.3–1.2)
Total Protein: 7.4 g/dL (ref 6.0–8.3)

## 2012-01-08 LAB — URINE MICROSCOPIC-ADD ON

## 2012-01-08 LAB — CBC WITH DIFFERENTIAL/PLATELET
Basophils Absolute: 0 10*3/uL (ref 0.0–0.1)
Basophils Relative: 0 % (ref 0–1)
Eosinophils Absolute: 0.1 10*3/uL (ref 0.0–0.7)
Eosinophils Relative: 1 % (ref 0–5)
HCT: 32.3 % — ABNORMAL LOW (ref 36.0–46.0)
Hemoglobin: 10.4 g/dL — ABNORMAL LOW (ref 12.0–15.0)
Lymphocytes Relative: 12 % (ref 12–46)
Lymphs Abs: 0.5 10*3/uL — ABNORMAL LOW (ref 0.7–4.0)
MCH: 25.6 pg — ABNORMAL LOW (ref 26.0–34.0)
MCHC: 32.2 g/dL (ref 30.0–36.0)
MCV: 79.6 fL (ref 78.0–100.0)
Monocytes Absolute: 0.1 10*3/uL (ref 0.1–1.0)
Monocytes Relative: 2 % — ABNORMAL LOW (ref 3–12)
Neutro Abs: 3.4 10*3/uL (ref 1.7–7.7)
Neutrophils Relative %: 85 % — ABNORMAL HIGH (ref 43–77)
Platelets: 235 10*3/uL (ref 150–400)
RBC: 4.06 MIL/uL (ref 3.87–5.11)
RDW: 16.3 % — ABNORMAL HIGH (ref 11.5–15.5)
WBC: 3.9 10*3/uL — ABNORMAL LOW (ref 4.0–10.5)

## 2012-01-08 LAB — LACTIC ACID, PLASMA: Lactic Acid, Venous: 3.1 mmol/L — ABNORMAL HIGH (ref 0.5–2.2)

## 2012-01-08 LAB — PROCALCITONIN: Procalcitonin: 1.85 ng/mL

## 2012-01-08 MED ORDER — VANCOMYCIN HCL IN DEXTROSE 1-5 GM/200ML-% IV SOLN
1000.0000 mg | Freq: Once | INTRAVENOUS | Status: AC
  Administered 2012-01-09: 1000 mg via INTRAVENOUS
  Filled 2012-01-08 (×2): qty 200

## 2012-01-08 MED ORDER — SODIUM CHLORIDE 0.9 % IV BOLUS (SEPSIS)
1000.0000 mL | Freq: Once | INTRAVENOUS | Status: AC
  Administered 2012-01-09: 1000 mL via INTRAVENOUS

## 2012-01-08 MED ORDER — SODIUM CHLORIDE 0.9 % IV BOLUS (SEPSIS)
1000.0000 mL | Freq: Once | INTRAVENOUS | Status: AC
  Administered 2012-01-08: 1000 mL via INTRAVENOUS

## 2012-01-08 MED ORDER — LEVOFLOXACIN IN D5W 500 MG/100ML IV SOLN
500.0000 mg | INTRAVENOUS | Status: DC
  Administered 2012-01-09: 500 mg via INTRAVENOUS
  Filled 2012-01-08: qty 100

## 2012-01-08 MED ORDER — ACETAMINOPHEN 325 MG PO TABS
650.0000 mg | ORAL_TABLET | Freq: Once | ORAL | Status: AC
  Administered 2012-01-09: 650 mg
  Filled 2012-01-08: qty 2

## 2012-01-08 MED ORDER — AZTREONAM 1 G IJ SOLR: 1.0000 g | Freq: Once | INTRAMUSCULAR | Status: DC

## 2012-01-08 NOTE — ED Provider Notes (Addendum)
History    78 year old female sent from nursing home for evaluation of hypoxia. Patient apparently woke up earlier this evening and seemed very anxious. Noted to be hypoxic. Patient chronically debilitated. Unfortunately patient is demented and unable to provide much useful history. Recent hospitalizations. Left arm PICC which placed on 6/28. No coughing. No recent medication changes. No report rash.  CSN: [id number]  Arrival date & time 01/08/12  2119   First MD Initiated Contact with Patient 01/08/12 2201      No chief complaint on file.   (Consider location/radiation/quality/duration/timing/severity/associated sxs/prior treatment) HPI  Past Medical History  Diagnosis Date  . Hypertension   . Reflux   . Right elbow pain     OTIF  . Dementia   . Depression   . Osteoarthritis   . Pancreatitis 11/2007    HOP  . Angiomyolipoma of kidney     right  . Ulcerative esophagitis 12/05/2007    hx elevated gastrin, severe on EGD by Dr Jena Gauss , h pylori negative  . Hiatal hernia   . S/P colonoscopy 2009    pt reports normal by Dr Lovell Sheehan  . Diabetes mellitus   . Anemia   . Hyponatremia   . Cellulitis   . Esophagitis   . Peptic ulcer   . Insomnia   . Sepsis   . Respiratory failure   . Sepsis   . Clostridium difficile infection   . Esophagitis   . Depression   . Encephalitis   . Urinary tract infection   . Bacteremia   . 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 Perforated prepyloric gastric ulcer s/p antrectomy with Billroth II reconstruction 01/30/2011    Past Surgical History  Procedure Date  . Orif right hip 1999    [facility]  . Umbilical hernia repair 96 months old    [place]  . Esophagogastroduodenoscopy 01/28/2011    Procedure: ESOPHAGOGASTRODUODENOSCOPY (EGD);  Surgeon: Arlyce Harman, MD;  [facility];  Service: Endoscopy;  Laterality: N/A;  . Colonoscopy 01/28/2011    Procedure: COLONOSCOPY;  Surgeon: Arlyce Harman, MD;   [facility];  Service: Endoscopy;  Laterality: N/A;  . Laparotomy 02/04/2011    Procedure: EXPLORATORY LAPAROTOMY;  Surgeon: Fabio Bering;  Location: [facility] ORS;  Service: General;  Laterality: N/A;  . Laparotomy 05/07/2011    Procedure: EXPLORATORY LAPAROTOMY;  Surgeon: Ardeth Sportsman, MD;  Location: [facility] OR;  Service: General;  Laterality: N/A;  lysis of adhesions  . Gastrostomy 05/07/2011    Procedure: GASTROSTOMY;  Surgeon: Ardeth Sportsman, MD;  Location: [facility] OR;  Service: General;  Laterality: N/A;  g-tube / j -tube placement  . Gastrectomy 05/07/2011    Procedure: GASTRECTOMY;  Surgeon: Ardeth Sportsman, MD;  Location: [facility] OR;  Service: General;  Laterality: N/A;  Partial gastrectomy  . Cholecystectomy 05/07/2011    Procedure: CHOLECYSTECTOMY;  Surgeon: Ardeth Sportsman, MD;  Location: [facility] OR;  Service: General;  Laterality: N/A;  open  . Tee without cardioversion 10/21/2011    Procedure: TRANSESOPHAGEAL ECHOCARDIOGRAM (TEE);  Surgeon: Wendall Stade, MD;  Location: [facility] ENDOSCOPY;  Service: Cardiovascular;  Laterality: N/A;    Family History  Problem Relation Age of Onset  . Cancer Mother     pelvic     History  Substance Use Topics  . Smoking status: Never Smoker   . Smokeless tobacco: Current User    Types: Chew   Comment: taken away while she is a  SNF  . Alcohol Use: No     Hx of Alcohol dependecy     OB History    Grav Para Term Preterm Abortions TAB SAB Ect Mult Living                  Review of Systems  Level V caveat applies because patient is demented.  Allergies  Other and Penicillins  Home Medications   Current Outpatient Rx  Name Route Sig Dispense Refill  . ACETAMINOPHEN 500 MG PO TABS Feeding Tube 500 mg by Feeding Tube route daily as needed. For pain    . AMLODIPINE BESYLATE 10 MG PO TABS Feeding Tube 10 mg by Feeding Tube route daily.    Marland Kitchen BUTALBITAL-APAP-CAFFEINE 50-325-40 MG PO TABS Feeding Tube 2 tablets by Feeding Tube route every 8 (eight)  hours as needed. For headache    . VITAMIN D3 5000 UNITS PO CAPS Oral Take 1 capsule by mouth once a week.    Marland Kitchen CLONIDINE HCL 0.2 MG/24HR TD PTWK Transdermal Place 1 patch onto the skin once a week. On monday    . DARIFENACIN HYDROBROMIDE ER 15 MG PO TB24 Feeding Tube 15 mg by Feeding Tube route daily.     Marland Kitchen ENOXAPARIN SODIUM 80 MG/0.8ML [place] SOLN Subcutaneous Inject 0.8 mLs (80 mg total) into the skin every 12 (twelve) hours. 22.4 Syringe   . FENTANYL 50 MCG/HR TD PT72 Transdermal Place 1 patch onto the skin every 3 (three) days.    . INSULIN ASPART 100 UNIT/ML [place] SOLN Subcutaneous Inject 5 Units into the skin 3 (three) times daily as needed. For cbg >150    . METOPROLOL TARTRATE 50 MG PO TABS Feeding Tube 50 mg by Feeding Tube route 2 (two) times daily.    Marland Kitchen METRONIDAZOLE 50 MG/ML ORAL SUSPENSION Per Tube Place 10 mLs (500 mg total) into feeding tube 3 (three) times daily.    Marland Kitchen JEVITY 1.2 CAL PO LIQD Per Tube Place 1,000 mLs into feeding tube continuous.    Marland Kitchen ONDANSETRON HCL 4 MG/5ML PO SOLN Oral Take 4 mg by mouth every 8 (eight) hours as needed. For nausea    . OXYCODONE HCL 5 MG PO TABS Per Tube Place 5 mg into feeding tube every 4 (four) hours as needed. For pain    . PANTOPRAZOLE SODIUM 40 MG PO TBEC Feeding Tube 40 mg by Feeding Tube route daily.    Marland Kitchen SACCHAROMYCES BOULARDII 250 MG PO CAPS Feeding Tube 250 mg by Feeding Tube route 2 (two) times daily.     . TRAZODONE HCL 50 MG PO TABS Per Tube Place 50 mg into feeding tube at bedtime as needed. For sleep    . TUBERCULIN PPD 5 UNIT/0.1ML [place] SOLN Intradermal Inject 5 Units into the skin every evening.      BP 134/63  Pulse 114  Temp 104 F (40 C) (Rectal)  Resp 28  SpO2 97%  Physical Exam  Nursing note and vitals reviewed. Constitutional:       Chronically debilitated appearing.  HENT:  Mouth/Throat: Oropharynx is clear and moist.  Eyes: Conjunctivae are normal. Pupils are equal, round, and reactive to light. Right eye exhibits no  discharge. Left eye exhibits no discharge.  Cardiovascular: Regular rhythm and normal heart sounds.        Tachycardic with a regular rhythm  Pulmonary/Chest: No stridor. She has no wheezes. She has no rales. She exhibits no tenderness.       Mild tachypnea  Abdominal:  Soft.       G-tube  Neurological:       Doesn't consistently follow commands.  Skin: Skin is warm and dry.    ED Course  Procedures (including critical care time)  Labs Reviewed  LACTIC ACID, PLASMA - Abnormal; Notable for the following:    Lactic Acid, Venous 3.1 (*)     All other components within normal limits  CBC WITH DIFFERENTIAL - Abnormal; Notable for the following:    WBC 3.9 (*)     Hemoglobin 10.4 (*)     HCT 32.3 (*)     MCH 25.6 (*)     RDW 16.3 (*)     Neutrophils Relative 85 (*)     Lymphs Abs 0.5 (*)     Monocytes Relative 2 (*)     All other components within normal limits  COMPREHENSIVE METABOLIC PANEL - Abnormal; Notable for the following:    Sodium 131 (*)     Glucose, Bld 133 (*)     Albumin 3.0 (*)     Alkaline Phosphatase 167 (*)     Total Bilirubin 0.2 (*)     GFR calc non Af Amer 84 (*)     All other components within normal limits  URINALYSIS, ROUTINE W REFLEX MICROSCOPIC - Abnormal; Notable for the following:    APPearance CLOUDY (*)     Protein, ur 100 (*)     All other components within normal limits  URINE MICROSCOPIC-ADD ON - Abnormal; Notable for the following:    Squamous Epithelial / LPF FEW (*)     All other components within normal limits  PROCALCITONIN  CULTURE, BLOOD (ROUTINE X 2)  CULTURE, BLOOD (ROUTINE X 2)  CLOSTRIDIUM DIFFICILE BY PCR   No results found.  EKG:  Rhythm: sinus tach Rate: 119 Axis: Right Intervals/Conduction: RV hypertrophy. ST segments: Ns St changes   1. Fever   2. Hypoxia   3. Elevated lactic acid level   4. HAP (hospital-acquired pneumonia)       MDM  80yF with with hypoxia. O2 sats good with supplemental o2 via [place].   Febrile. Eval for source of fever. L arm PICC without obvious external signs of infection. UA ok. Normotensive but markedly febrile and elevated lactic acid so will tx empirically at this time although source not completely clear. CXR equivocal. Questionable opacities.  Given recent admit with cover for HAP. Will require admit for further tx and eval.  Pt DNR.        Raeford Razor, MD 01/09/12 0003  Raeford Razor, MD 01/14/12 1610  Raeford Razor, MD 02/16/12 1246

## 2012-01-08 NOTE — ED Notes (Signed)
[id number]<QI> Expected date:01/08/12<BR> Expected time: 9:12 PM<BR> Means of arrival:Ambulance<BR> Comments:<BR> Hypoxia, anxiety

## 2012-01-08 NOTE — ED Notes (Addendum)
Per EMS:  Pt woke up with anxiety, initial 02 sat was in 80s on RA, EMS placed pt on 4L [place] and got 02 sats up to 100.  Pt is not in resp distress, lung sounds clear and equal.  Pt is a neuro baseline which.  Pt has PICC line in L arm and fentanyl patch on R shoulder.

## 2012-01-09 DIAGNOSIS — J189 Pneumonia, unspecified organism: Secondary | ICD-10-CM | POA: Diagnosis present

## 2012-01-09 DIAGNOSIS — K316 Fistula of stomach and duodenum: Secondary | ICD-10-CM

## 2012-01-09 DIAGNOSIS — F0390 Unspecified dementia without behavioral disturbance: Secondary | ICD-10-CM | POA: Diagnosis present

## 2012-01-09 LAB — CBC
Platelets: 219 10*3/uL (ref 150–400)
RBC: 3.33 MIL/uL — ABNORMAL LOW (ref 3.87–5.11)
RDW: 16.4 % — ABNORMAL HIGH (ref 11.5–15.5)
WBC: 6.8 10*3/uL (ref 4.0–10.5)

## 2012-01-09 LAB — BASIC METABOLIC PANEL
CO2: 19 mEq/L (ref 19–32)
Calcium: 8.1 mg/dL — ABNORMAL LOW (ref 8.4–10.5)
GFR calc Af Amer: >90 mL/min (ref >90)
GFR calc non Af Amer: 80 mL/min — ABNORMAL LOW (ref >90)
Sodium: 129 mEq/L — ABNORMAL LOW (ref 135–145)

## 2012-01-09 LAB — MRSA PCR SCREENING: MRSA by PCR: NEGATIVE

## 2012-01-09 LAB — GLUCOSE, CAPILLARY: Glucose-Capillary: 118 mg/dL — ABNORMAL HIGH (ref 70–99)

## 2012-01-09 LAB — EXPECTORATED SPUTUM ASSESSMENT W GRAM STAIN, RFLX TO RESP C

## 2012-01-09 LAB — STREP PNEUMONIAE URINARY ANTIGEN: Strep Pneumo Urinary Antigen: NEGATIVE

## 2012-01-09 LAB — CLOSTRIDIUM DIFFICILE BY PCR: Toxigenic C. Difficile by PCR: POSITIVE — AB

## 2012-01-09 MED ORDER — DEXTROSE 5 % IV SOLN
1.0000 g | Freq: Once | INTRAVENOUS | Status: AC
  Administered 2012-01-09: 1 g via INTRAVENOUS
  Filled 2012-01-09: qty 1

## 2012-01-09 MED ORDER — AMLODIPINE BESYLATE 10 MG PO TABS
10.0000 mg | ORAL_TABLET | Freq: Every day | ORAL | Status: DC
  Administered 2012-01-11 – 2012-01-15 (×5): 10 mg
  Filled 2012-01-09 (×7): qty 1

## 2012-01-09 MED ORDER — SODIUM CHLORIDE 0.9 % IV SOLN
INTRAVENOUS | Status: AC
  Administered 2012-01-09: 04:00:00 via INTRAVENOUS

## 2012-01-09 MED ORDER — VANCOMYCIN 50 MG/ML ORAL SOLUTION: 125.0000 mg | Freq: Two times a day (BID) | ORAL | Status: DC

## 2012-01-09 MED ORDER — FENTANYL 50 MCG/HR TD PT72
50.0000 ug | MEDICATED_PATCH | TRANSDERMAL | Status: DC
  Administered 2012-01-11 – 2012-01-14 (×2): 50 ug via TRANSDERMAL
  Filled 2012-01-09 (×2): qty 1

## 2012-01-09 MED ORDER — JEVITY 1.2 CAL PO LIQD
1000.0000 mL | ORAL | Status: DC
  Filled 2012-01-09: qty 1000

## 2012-01-09 MED ORDER — LEVOFLOXACIN IN D5W 750 MG/150ML IV SOLN
750.0000 mg | INTRAVENOUS | Status: DC
  Filled 2012-01-09: qty 150

## 2012-01-09 MED ORDER — VANCOMYCIN 50 MG/ML ORAL SOLUTION: 125.0000 mg | ORAL | Status: DC

## 2012-01-09 MED ORDER — TRAZODONE HCL 50 MG PO TABS
50.0000 mg | ORAL_TABLET | Freq: Every evening | ORAL | Status: DC | PRN
  Administered 2012-01-09 – 2012-01-14 (×3): 50 mg
  Filled 2012-01-09 (×3): qty 1

## 2012-01-09 MED ORDER — DARIFENACIN HYDROBROMIDE ER 15 MG PO TB24
15.0000 mg | ORAL_TABLET | Freq: Every day | ORAL | Status: DC
  Administered 2012-01-09 – 2012-01-15 (×7): 15 mg via ORAL
  Filled 2012-01-09 (×7): qty 1

## 2012-01-09 MED ORDER — SODIUM CHLORIDE 0.9 % IV SOLN
INTRAVENOUS | Status: DC
  Administered 2012-01-10: 20 mL/h via INTRAVENOUS
  Administered 2012-01-12 – 2012-01-14 (×2): via INTRAVENOUS

## 2012-01-09 MED ORDER — DEXTROSE 5 % IV SOLN
2.0000 g | Freq: Three times a day (TID) | INTRAVENOUS | Status: DC
  Administered 2012-01-09 – 2012-01-10 (×4): 2 g via INTRAVENOUS
  Filled 2012-01-09 (×5): qty 2

## 2012-01-09 MED ORDER — ONDANSETRON HCL 4 MG/2ML IJ SOLN: 4.0000 mg | Freq: Three times a day (TID) | INTRAMUSCULAR | Status: AC | PRN

## 2012-01-09 MED ORDER — VANCOMYCIN 50 MG/ML ORAL SOLUTION
125.0000 mg | Freq: Four times a day (QID) | ORAL | Status: DC
  Administered 2012-01-09 – 2012-01-15 (×22): 125 mg via ORAL
  Filled 2012-01-09 (×26): qty 2.5

## 2012-01-09 MED ORDER — ENOXAPARIN SODIUM 80 MG/0.8ML ~~LOC~~ SOLN
80.0000 mg | Freq: Two times a day (BID) | SUBCUTANEOUS | Status: DC
  Administered 2012-01-09 – 2012-01-15 (×13): 80 mg via SUBCUTANEOUS
  Filled 2012-01-09 (×16): qty 0.8

## 2012-01-09 MED ORDER — TUBERCULIN PPD 5 UNIT/0.1ML ID SOLN: 5.0000 [IU] | Freq: Every evening | INTRADERMAL | Status: DC

## 2012-01-09 MED ORDER — PANTOPRAZOLE SODIUM 40 MG PO TBEC
40.0000 mg | DELAYED_RELEASE_TABLET | Freq: Every day | ORAL | Status: DC
  Administered 2012-01-09 – 2012-01-14 (×6): 40 mg via ORAL
  Filled 2012-01-09 (×6): qty 1

## 2012-01-09 MED ORDER — OXYCODONE HCL 5 MG PO TABS
5.0000 mg | ORAL_TABLET | ORAL | Status: DC | PRN
  Administered 2012-01-09 – 2012-01-14 (×15): 5 mg
  Filled 2012-01-09 (×17): qty 1

## 2012-01-09 MED ORDER — CLONIDINE HCL 0.2 MG/24HR TD PTWK
0.2000 mg | MEDICATED_PATCH | TRANSDERMAL | Status: DC
  Administered 2012-01-11: 0.2 mg via TRANSDERMAL
  Filled 2012-01-09: qty 1

## 2012-01-09 MED ORDER — VANCOMYCIN 50 MG/ML ORAL SOLUTION: 125.0000 mg | Freq: Every day | ORAL | Status: DC

## 2012-01-09 MED ORDER — JEVITY 1.2 CAL PO LIQD
1000.0000 mL | ORAL | Status: DC
  Administered 2012-01-09 – 2012-01-10 (×2): 1000 mL
  Filled 2012-01-09 (×2): qty 1000

## 2012-01-09 MED ORDER — LEVOFLOXACIN IN D5W 750 MG/150ML IV SOLN: 750.0000 mg | INTRAVENOUS | Status: DC

## 2012-01-09 MED ORDER — ACETAMINOPHEN 500 MG PO TABS
500.0000 mg | ORAL_TABLET | Freq: Every day | ORAL | Status: DC | PRN
  Administered 2012-01-09 (×2): 500 mg
  Filled 2012-01-09 (×3): qty 1

## 2012-01-09 MED ORDER — LEVOFLOXACIN IN D5W 750 MG/150ML IV SOLN
750.0000 mg | INTRAVENOUS | Status: DC
  Administered 2012-01-09: 750 mg via INTRAVENOUS
  Filled 2012-01-09: qty 150

## 2012-01-09 MED ORDER — METOPROLOL TARTRATE 50 MG PO TABS
50.0000 mg | ORAL_TABLET | Freq: Two times a day (BID) | ORAL | Status: DC
  Administered 2012-01-09 – 2012-01-15 (×13): 50 mg
  Filled 2012-01-09 (×16): qty 1

## 2012-01-09 MED ORDER — INSULIN ASPART 100 UNIT/ML ~~LOC~~ SOLN
0.0000 [IU] | SUBCUTANEOUS | Status: DC
Start: 2012-01-09 — End: 2012-01-15
  Administered 2012-01-09: 2 [IU] via SUBCUTANEOUS
  Administered 2012-01-09 – 2012-01-12 (×4): 1 [IU] via SUBCUTANEOUS
  Administered 2012-01-12: 2 [IU] via SUBCUTANEOUS
  Administered 2012-01-13 – 2012-01-14 (×8): 1 [IU] via SUBCUTANEOUS

## 2012-01-09 MED ORDER — VANCOMYCIN HCL 1000 MG IV SOLR
750.0000 mg | Freq: Two times a day (BID) | INTRAVENOUS | Status: DC
  Administered 2012-01-09 – 2012-01-11 (×4): 750 mg via INTRAVENOUS
  Filled 2012-01-09 (×5): qty 750

## 2012-01-09 NOTE — Progress Notes (Addendum)
Patient seen and examined by me. Please refer to H&p done by Dr. Rito Ehrlich.   Restarted nutrition.  ? HCAPNA Await c-diff. Poor overall prognosis Gram negative rods in blood- on abx- will get ID consult in AM Marlin Canary DO

## 2012-01-09 NOTE — H&P (Signed)
[facility] History and Physical  [name] ZOX:[id number] DOB: Mar 03, 1932 DOA: 01/08/2012  Referring physician: Belinda Block, emergency room physician  PCP: [name], DO   Chief Complaint: Hypoxia  HPI:  Patient is a 78 year old African American female with past medical history of diabetes mellitus, hypertension, DVT and short gut syndrome from an internal gastroileitis fistula as well as multiple admissions to the hospital who was just discharged from the hospitalist service 2 weeks ago for a hospitalization for C. difficile colitis and recurrent fevers with a possible PICC line infection. Patient was discharged back to nursing facility but was brought into the emergency room today when she was noted to be hypoxic with oxygen saturations of 80% on room air.  In the emergency room, the patient was noted to have a white count of only 3.9 but with an 85% shift. She documented temperature of 104 rectally and was satting 97% on 3 L. She was noted to have mild lung base of pacer this left greater than right which are felt to be atelectasis versus infiltrate. Because no other source could be identified (urinalysis was unremarkable), she was being treated as a healthcare associated pneumonia. Blood cultures were drawn and patient was started on IV antibiotics. Hospitalists were called for further evaluation and admission.  Review of Systems:  The patient herself has dementia and is a very poor historian. I started asking her if her head hurt and she said yes however she answers yes to every single question I ask her some unable to get any kind of pertinent review of systems.  Past Medical History  Diagnosis Date  . Hypertension   . Reflux   . Right elbow pain     OTIF  . Dementia   . Depression   . Osteoarthritis   . Pancreatitis 11/2007    HOP  . Angiomyolipoma of kidney     right  . Ulcerative esophagitis 12/05/2007    hx elevated gastrin, severe on EGD by Dr Jena Gauss , h pylori  negative  . Hiatal hernia   . S/P colonoscopy 2009    pt reports normal by Dr Lovell Sheehan  . Diabetes mellitus   . Anemia   . Hyponatremia   . Cellulitis   . Esophagitis   . Peptic ulcer   . Insomnia   . Sepsis   . Respiratory failure   . Sepsis   . Clostridium difficile infection   . Esophagitis   . Depression   . Encephalitis   . Urinary tract infection   . Bacteremia   . 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 Perforated prepyloric gastric ulcer s/p antrectomy with Billroth II reconstruction 01/30/2011   Past Surgical History  Procedure Date  . Orif right hip 1999    [facility]  . Umbilical hernia repair 54 months old    [place]  . Esophagogastroduodenoscopy 01/28/2011    Procedure: ESOPHAGOGASTRODUODENOSCOPY (EGD);  Surgeon: Arlyce Harman, MD;  Location: [facility];  Service: Endoscopy;  Laterality: N/A;  . Colonoscopy 01/28/2011    Procedure: COLONOSCOPY;  Surgeon: Arlyce Harman, MD;  Location: [facility];  Service: Endoscopy;  Laterality: N/A;  . Laparotomy 02/04/2011    Procedure: EXPLORATORY LAPAROTOMY;  Surgeon: Fabio Bering;  Location: [facility] ORS;  Service: General;  Laterality: N/A;  . Laparotomy 05/07/2011    Procedure: EXPLORATORY LAPAROTOMY;  Surgeon: Ardeth Sportsman, MD;  Location: [facility] OR;  Service: General;  Laterality: N/A;  lysis of adhesions  .  Gastrostomy 05/07/2011    Procedure: GASTROSTOMY;  Surgeon: Ardeth Sportsman, MD;  Location: [facility] OR;  Service: General;  Laterality: N/A;  g-tube / j -tube placement  . Gastrectomy 05/07/2011    Procedure: GASTRECTOMY;  Surgeon: Ardeth Sportsman, MD;  Location: [facility] OR;  Service: General;  Laterality: N/A;  Partial gastrectomy  . Cholecystectomy 05/07/2011    Procedure: CHOLECYSTECTOMY;  Surgeon: Ardeth Sportsman, MD;  [facility] OR;  Service: General;  Laterality: N/A;  open  . Tee without cardioversion 10/21/2011    Procedure: TRANSESOPHAGEAL ECHOCARDIOGRAM (TEE);  Surgeon: Wendall Stade, MD;   Location: [facility] ENDOSCOPY;  Service: Cardiovascular;  Laterality: N/A;   Social History:  reports that she has never smoked. Her smokeless tobacco use includes Chew. She reports that she does not drink alcohol or use illicit drugs. patient resides in a nursing facility.  Allergies  Allergen Reactions  . Other Swelling    Pinto beans cause swelling and hives  . Penicillins Swelling    Family History  Problem Relation Age of Onset  . Cancer Mother     pelvic     Prior to Admission medications   Medication Sig Start Date End Date Taking? Authorizing Provider  amLODipine (NORVASC) 10 MG tablet 10 mg by Feeding Tube route daily. 10/27/11 10/26/12 Yes Ripudeep Jenna Luo, MD  insulin aspart (NOVOLOG) 100 UNIT/ML injection Inject 5 Units into the skin 3 (three) times daily as needed. For cbg >150   Yes Historical Provider, MD  metoprolol (LOPRESSOR) 50 MG tablet 50 mg by Feeding Tube route 2 (two) times daily. 10/27/11 10/26/12 Yes Ripudeep Jenna Luo, MD  metroNIDAZOLE (FLAGYL) 50 mg/ml oral suspension Place 10 mLs (500 mg total) into feeding tube 3 (three) times daily. 12/25/11 02/04/12 Yes Clydia Llano, MD  acetaminophen (TYLENOL) 500 MG tablet 500 mg by Feeding Tube route daily as needed. For pain 10/27/11 10/26/12  Ripudeep Jenna Luo, MD  butalbital-acetaminophen-caffeine (FIORICET, ESGIC) 50-325-40 MG per tablet 2 tablets by Feeding Tube route every 8 (eight) hours as needed. For headache 08/18/11 08/17/12  Laveda Norman, MD  cloNIDine (CATAPRES - DOSED IN MG/24 HR) 0.2 mg/24hr patch Place 1 patch onto the skin once a week. On monday 10/27/11 10/26/12  Ripudeep Jenna Luo, MD  darifenacin (ENABLEX) 15 MG 24 hr tablet 15 mg by Feeding Tube route daily.     Historical Provider, MD  enoxaparin (LOVENOX) 80 MG/0.8ML injection Inject 0.8 mLs (80 mg total) into the skin every 12 (twelve) hours. 10/27/11   Ripudeep Jenna Luo, MD  fentaNYL (DURAGESIC - DOSED MCG/HR) 50 MCG/HR Place 1 patch onto the skin every 3 (three) days.    Historical  Provider, MD  Nutritional Supplements (FEEDING SUPPLEMENT, JEVITY 1.2 CAL,) LIQD Place 1,000 mLs into feeding tube continuous.    Historical Provider, MD  ondansetron (ZOFRAN) 4 MG/5ML solution Take 4 mg by mouth every 8 (eight) hours as needed. For nausea    Historical Provider, MD  oxyCODONE (OXY IR/ROXICODONE) 5 MG immediate release tablet Place 5 mg into feeding tube every 4 (four) hours as needed. For pain    Historical Provider, MD  pantoprazole (PROTONIX) 40 MG tablet 40 mg by Feeding Tube route daily. 10/27/11   Ripudeep Jenna Luo, MD  saccharomyces boulardii (FLORASTOR) 250 MG capsule 250 mg by Feeding Tube route 2 (two) times daily.     Historical Provider, MD  traZODone (DESYREL) 50 MG tablet Place 50 mg into feeding tube at bedtime as needed. For sleep  10/27/11   Ripudeep Jenna Luo, MD  tuberculin (APLISOL) 5 UNIT/0.1ML injection Inject 5 Units into the skin every evening.    Historical Provider, MD   Physical Exam: Filed Vitals:   01/08/12 2125 01/08/12 2128 01/08/12 2252 01/09/12 0042  BP:  134/63    Pulse:  114    Temp:  99.1 F (37.3 C) 104 F (40 C) 103.9 F (39.9 C)  TempSrc:   Rectal Rectal  Resp:  28    SpO2: 100% 97%       General:  Fatigued, looks about stated age, alert and oriented x1  Eyes: Sclera is nonicteric, extraocular movements appear to be intact  ENT: Normocephalic, atraumatic, mucous membranes are slightly dry  Neck: Supple, no JVD no thyromegaly  Cardiovascular: Regular rate and rhythm, S1-S2  Respiratory: Clear to auscultation bilaterally  Abdomen: Soft, nontender?, nondistended, hypoactive bowel sounds  Skin: No obvious skin tears or lesions or rashes  Musculoskeletal: Some overall deficiency but, but nonfocal  Psychiatric: She has chronic dementia but no signs of acute psychoses  Neurologic: No evidence of focal neurological deficit  Labs on Admission:  Basic Metabolic Panel:  Lab 01/08/12 1191  NA 131*  K 3.9  CL 98  CO2 21  GLUCOSE  133*  BUN 18  CREATININE 0.63  CALCIUM 8.8  MG --  PHOS --   Liver Function Tests:  Lab 01/08/12 2305  AST 27  ALT 22  ALKPHOS 167*  BILITOT 0.2*  PROT 7.4  ALBUMIN 3.0*   CBC:  Lab 01/08/12 2305  WBC 3.9*  NEUTROABS 3.4  HGB 10.4*  HCT 32.3*  MCV 79.6  PLT 235    Radiological Exams on Admission:  Dg Chest Portable 1 View 01/08/2012    IMPRESSION: Hypoaeration results in interstitial and vascular crowding.  Mild lung base opacity; atelectasis versus infiltrate.  Original Report Authenticated By: Waneta Martins, M.D.    EKG: Independently reviewed. Sinus tachycardia  Assessment/Plan Principal Problem:  *PNA (pneumonia): We'll treat S. healthcare associated pneumonia. Noted penicillin allergy so treated with aztreonam and vancomycin per pharmacy. We'll provide oxygen support. It is of note that with the patient's recent C. difficile colitis and persistent fevers, there may be another source of infection. We'll continue to monitor closely.  Active Problems:  DIABETES MELLITUS, TYPE II: Sliding scale   HYPERLIPIDEMIA: Stable medical issues   DEPRESSION: Appears to be stable medical issues  Dementia: Stable.   HYPERTENSION: Continuing antihypertensive medications   Anemia: Hemoglobin is actually improved from previous hospitalization.   Perforated prepyloric gastric ulcer s/p antrectomy with Billroth II reconstruction: History of short gut syndrome. Continue tube feedings.   Code Status: DO NOT RESUSCITATE  Family CommunicationLangston Reusing Daughter  Home: [phone] cell: [phone]  Disposition Plan: Stable. If she responds appropriately to antibiotics she may be able to go back to the nursing facility in a few days.  Hollice Espy [facility] Pager [phone]  If 7PM-7AM, please contact night-coverage [email or web address] Password [email or web address] 01/09/2012, 1:05 AM

## 2012-01-09 NOTE — ED Notes (Signed)
Attempted to call report to 5E.  Nurse said she didn't know she was getting a pt, st's they will call back.

## 2012-01-09 NOTE — Progress Notes (Signed)
ANTIBIOTIC CONSULT NOTE - INITIAL  Pharmacy Consult for vancomycin/antibiotic monitoring Indication: rule out pneumonia  Allergies  Allergen Reactions  . Other Swelling    Pinto beans cause swelling and hives  . Penicillins Swelling    Patient Measurements: Height: 5' 6.5" (168.9 cm) Weight: 164 lb 3.2 oz (74.481 kg) IBW/kg (Calculated) : 60.45  Adjusted Body Weight:   Vital Signs: Temp: 99.6 F (37.6 C) (07/13 0200) Temp src: Oral (07/13 0200) BP: 135/75 mmHg (07/13 0300) Pulse Rate: 99  (07/13 0200) Intake/Output from previous day: 07/12 0701 - 07/13 0700 In: -  Out: 200 [Urine:200] Intake/Output from this shift: Total I/O In: -  Out: 200 [Urine:200]  Labs:  [facility] 01/08/12 2305  WBC 3.9*  HGB 10.4*  PLT 235  LABCREA --  CREATININE 0.63   Estimated Creatinine Clearance: 60.5 ml/min (by C-G formula based on Cr of 0.63). No results found for this basename: VANCOTROUGH:2,VANCOPEAK:2,VANCORANDOM:2,GENTTROUGH:2,GENTPEAK:2,GENTRANDOM:2,TOBRATROUGH:2,TOBRAPEAK:2,TOBRARND:2,AMIKACINPEAK:2,AMIKACINTROU:2,AMIKACIN:2, in the last 72 hours   Microbiology: Recent Results (from the past 720 hour(s))  URINE CULTURE     Status: Normal   Collection Time   12/20/11  7:03 PM      Component Value Range Status Comment   Specimen Description URINE, RANDOM   Final    Special Requests NONE   Final    Culture  Setup Time [id number]   Final    Colony Count NO GROWTH   Final    Culture NO GROWTH   Final    Report Status 12/22/2011 FINAL   Final   CATH TIP CULTURE     Status: Normal   Collection Time   12/21/11  3:43 AM      Component Value Range Status Comment   Specimen Description CATH TIP   Final    Special Requests NONE   Final    Culture     Final    Value: 30 COLONIES STAPHYLOCOCCUS SPECIES (COAGULASE NEGATIVE)     Note: RIFAMPIN AND GENTAMICIN SHOULD NOT BE USED AS SINGLE DRUGS FOR TREATMENT OF STAPH INFECTIONS.   Report Status 12/25/2011 FINAL   Final    Organism  ID, Bacteria STAPHYLOCOCCUS SPECIES (COAGULASE NEGATIVE)   Final   CLOSTRIDIUM DIFFICILE BY PCR     Status: Abnormal   Collection Time   12/22/11  7:18 PM      Component Value Range Status Comment   C difficile by pcr POSITIVE (*) NEGATIVE Final   MRSA PCR SCREENING     Status: Normal   Collection Time   12/23/11  2:55 AM      Component Value Range Status Comment   MRSA by PCR NEGATIVE  NEGATIVE Final     Medical History: Past Medical History  Diagnosis Date  . Hypertension   . Reflux   . Right elbow pain     OTIF  . Dementia   . Depression   . Osteoarthritis   . Pancreatitis 11/2007    [facility]  . Angiomyolipoma of kidney     right  . Ulcerative esophagitis 12/05/2007    hx elevated gastrin, severe on EGD by Dr Jena Gauss , h pylori negative  . Hiatal hernia   . S/P colonoscopy 2009    pt reports normal by Dr Lovell Sheehan  . Diabetes mellitus   . Anemia   . Hyponatremia   . Cellulitis   . Esophagitis   . Peptic ulcer   . Insomnia   . Sepsis   . Respiratory failure   . Sepsis   . Clostridium  difficile infection   . Esophagitis   . Depression   . Encephalitis   . Urinary tract infection   . Bacteremia   . 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 Perforated prepyloric gastric ulcer s/p antrectomy with Billroth II reconstruction 01/30/2011    Medications:  Anti-infectives     Start     Dose/Rate Route Frequency Ordered Stop   01/09/12 1800   levofloxacin (LEVAQUIN) IVPB 750 mg  Status:  Discontinued        750 mg 100 mL/hr over 90 Minutes Intravenous Every 24 hours 01/09/12 0310 01/09/12 0311   01/09/12 1800   levofloxacin (LEVAQUIN) IVPB 750 mg        750 mg 100 mL/hr over 90 Minutes Intravenous Every 24 hours 01/09/12 0311 01/12/12 1759   01/09/12 1800   vancomycin (VANCOCIN) 750 mg in sodium chloride 0.9 % 150 mL IVPB        750 mg 150 mL/hr over 60 Minutes Intravenous Every 12 hours 01/09/12 0348     01/09/12 0600   aztreonam (AZACTAM)  2 g in dextrose 5 % 50 mL IVPB        2 g 100 mL/hr over 30 Minutes Intravenous 3 times per day 01/09/12 0216 01/17/12 0559   01/09/12 0230   levofloxacin (LEVAQUIN) IVPB 750 mg  Status:  Discontinued        750 mg 100 mL/hr over 90 Minutes Intravenous Every 24 hours 01/09/12 0216 01/09/12 0311   01/09/12 0015   aztreonam (AZACTAM) 1 g in dextrose 5 % 50 mL IVPB        1 g 100 mL/hr over 30 Minutes Intravenous  Once 01/09/12 0002 01/09/12 0225   01/09/12 0000   vancomycin (VANCOCIN) IVPB 1000 mg/200 mL premix        1,000 mg 200 mL/hr over 60 Minutes Intravenous  Once 01/08/12 2350     01/09/12 0000   levofloxacin (LEVAQUIN) IVPB 500 mg  Status:  Discontinued        500 mg 100 mL/hr over 60 Minutes Intravenous Every 24 hours 01/08/12 2350 01/09/12 0219   01/09/12 0000   aztreonam (AZACTAM) injection 1 g  Status:  Discontinued        1 g Intramuscular  Once 01/08/12 2358 01/09/12 0001         Assessment: Patient with PNA.  First dose of antibiotics already given in ED.  Goal of Therapy:  Vancomycin trough level 15-20 mcg/ml Monitor antibiotics for renal adjustment  Plan:  Measure antibiotic drug levels at steady state Follow up culture results Vancomycin 750mg  iv q12hr  Aleene Davidson Crowford 01/09/2012,3:49 AM

## 2012-01-09 NOTE — Progress Notes (Addendum)
Nutrition Brief Note:  RD consult for initiation and management TF noted. RD called [facility] 5E and spoke with RN.  Pt with PEG, home TF, dementia.  Pt unable to provide nutrition-related hx at this time.  RN unaware of pt's home regimen at this time (recent admission).  MD has ordered Jevity 1.2 @ 30 mL/hr goal for pt which provides 864 kcal, 39 g protein, 590 mL free water. Labs and meds reviewed.  RD to initiate Adult Enteral Nutrition Protocol and to adjust protocol for MD's preference of notification at residuals >150 mL.  Additional adjustments in pt's regimen by RD to be made based on nutrition hx, home regimen, and care plan.  Notified RN of after hours pager number and RD availability.  Loyce Dys, MS RD LDN, weekend coverage Weekend/After hours pager number: [phone]  -- Addendum (7/13, 1315):  RD paged by MD, ok to initiate residual management per protocol.  RD to adjust order set. Loyce Dys, MS RD LDN, weekend coverage  Weekend/After hours pager number: [phone]

## 2012-01-10 ENCOUNTER — Inpatient Hospital Stay (HOSPITAL_COMMUNITY): Payer: Medicare HMO

## 2012-01-10 DIAGNOSIS — A0472 Enterocolitis due to Clostridium difficile, not specified as recurrent: Secondary | ICD-10-CM

## 2012-01-10 DIAGNOSIS — R7881 Bacteremia: Principal | ICD-10-CM

## 2012-01-10 LAB — LEGIONELLA ANTIGEN, URINE: Legionella Antigen, Urine: NEGATIVE

## 2012-01-10 LAB — GLUCOSE, CAPILLARY: Glucose-Capillary: 110 mg/dL — ABNORMAL HIGH (ref 70–99)

## 2012-01-10 MED ORDER — JEVITY 1.2 CAL PO LIQD
1000.0000 mL | ORAL | Status: DC
  Administered 2012-01-11 – 2012-01-14 (×3): 1000 mL
  Filled 2012-01-10 (×7): qty 1000

## 2012-01-10 MED ORDER — LEVOFLOXACIN IN D5W 750 MG/150ML IV SOLN
750.0000 mg | INTRAVENOUS | Status: DC
  Filled 2012-01-10: qty 150

## 2012-01-10 MED ORDER — ACETAMINOPHEN 325 MG PO TABS
650.0000 mg | ORAL_TABLET | Freq: Four times a day (QID) | ORAL | Status: DC | PRN
  Administered 2012-01-11 – 2012-01-13 (×4): 650 mg via ORAL
  Filled 2012-01-10 (×6): qty 2
  Filled 2012-01-10: qty 1

## 2012-01-10 MED ORDER — FREE WATER
200.0000 mL | Freq: Four times a day (QID) | Status: DC
  Administered 2012-01-10 – 2012-01-15 (×19): 200 mL

## 2012-01-10 MED ORDER — PRO-STAT SUGAR FREE PO LIQD
30.0000 mL | Freq: Every day | ORAL | Status: DC
  Administered 2012-01-10 – 2012-01-15 (×6): 30 mL
  Filled 2012-01-10 (×6): qty 30

## 2012-01-10 MED ORDER — DEXTROSE 5 % IV SOLN
2.0000 g | Freq: Three times a day (TID) | INTRAVENOUS | Status: DC
  Administered 2012-01-10 – 2012-01-11 (×4): 2 g via INTRAVENOUS
  Filled 2012-01-10 (×5): qty 2

## 2012-01-10 NOTE — Progress Notes (Signed)
[facility] PROGRESS NOTE  [name]:[id number] DOB: 28-Jan-1932 DOA: 01/08/2012 PCP: [name], DO  Assessment/Plan: Principal Problem:  *PNA (pneumonia) Active Problems:  DIABETES MELLITUS, TYPE II  HYPERLIPIDEMIA  DEPRESSION  HYPERTENSION  Anemia  Perforated prepyloric gastric ulcer s/p antrectomy with Billroth II reconstruction  Dementia  1. c-diff- had on previous admission- place on vanc taper 2. Gram negative rod bacteremia- on triple abx for ? PNA- will d/c levaquin continue vanc and azactam Other medical problems: DIABETES MELLITUS, TYPE II: Sliding scale  HYPERLIPIDEMIA: Stable medical issues  DEPRESSION: Appears to be stable medical issues  Dementia: Stable.  HYPERTENSION: Continuing antihypertensive medications  Anemia: Hemoglobin is actually improved from previous hospitalization.  Perforated prepyloric gastric ulcer s/p antrectomy with Billroth II reconstruction: History of short gut syndrome. Continue tube feedings.  May need ID consult in AM as patient has c-diff so abx should be limited and has gram neg bacteremia needing IV abx ?recent hospitalization with possible PICC line infection- staph coag negative  Code Status: dnr Family Communication: none Disposition Plan: back to SNF    HPI/Subjective: Patient feeling better today, head ache gone BC came back positive yesterday    Objective: Filed Vitals:   01/09/12 1439 01/09/12 2216 01/10/12 0654 01/10/12 0829  BP: 105/65 117/78 129/76 114/41  Pulse: 79 82 79 79  Temp: 98.1 F (36.7 C) 98.4 F (36.9 C) 98.7 F (37.1 C)   TempSrc: Oral Oral Oral   Resp: 18 18 18 18   Height:      Weight:   75.6 kg (166 lb 10.7 oz)   SpO2: 100% 100% 100% 100%    Intake/Output Summary (Last 24 hours) at 01/10/12 1157 Last data filed at 01/09/12 1440  Gross per 24 hour  Intake      0 ml  Output      0 ml  Net      0 ml    Exam:  General: pleasant/cooperative, alert and oriented x1  Eyes:  Sclera is nonicteric, extraocular movements appear to be intact  ENT: Normocephalic, atraumatic, mucous membranes are slightly dry  Neck: Supple, no JVD no thyromegaly  Cardiovascular: Regular rate and rhythm, S1-S2  Respiratory: Clear to auscultation bilaterally  Abdomen: Soft, nontender?, nondistended, hypoactive bowel sounds - peg tube placed Skin: No obvious skin tears or lesions or rashes  Musculoskeletal: Some overall deficiency but, but nonfocal  Psychiatric: She has chronic dementia but no signs of acute psychoses  Neurologic: No evidence of focal neurological deficit  Data Reviewed: Basic Metabolic Panel:  Lab 01/09/12 8119 01/08/12 2305  NA 129* 131*  K 3.8 3.9  CL 100 98  CO2 19 21  GLUCOSE 144* 133*  BUN 20 18  CREATININE 0.72 0.63  CALCIUM 8.1* 8.8  MG -- --  PHOS -- --   Liver Function Tests:  Lab 01/08/12 2305  AST 27  ALT 22  ALKPHOS 167*  BILITOT 0.2*  PROT 7.4  ALBUMIN 3.0*   No results found for this basename: LIPASE:5,AMYLASE:5 in the last 168 hours No results found for this basename: AMMONIA:5 in the last 168 hours CBC:  Lab 01/09/12 0619 01/08/12 2305  WBC 6.8 3.9*  NEUTROABS -- 3.4  HGB 8.4* 10.4*  HCT 26.0* 32.3*  MCV 78.1 79.6  PLT 219 235   Cardiac Enzymes: No results found for this basename: CKTOTAL:5,CKMB:5,CKMBINDEX:5,TROPONINI:5 in the last 168 hours BNP (last 3 results)  Basename 05/11/11 0520 05/07/11 1950 05/07/11 1041  PROBNP 702.4* 959.3* 1403.0*  CBG:  Lab 01/10/12 0824 01/10/12 0403 01/09/12 2355 01/09/12 2017 01/09/12 1648  GLUCAP 120* 107* 105* 118* 115*    Recent Results (from the past 240 hour(s))  CULTURE, BLOOD (ROUTINE X 2)     Status: Normal (Preliminary result)   Collection Time   01/08/12 11:05 PM      Component Value Range Status Comment   Specimen Description BLOOD RIGHT ANTECUBITAL   Final    Special Requests BOTTLES DRAWN AEROBIC AND ANAEROBIC 5CC   Final    Culture  Setup Time 01/09/2012 03:57    Final    Culture     Final    Value: GRAM NEGATIVE RODS     Note: Gram Stain Report Called to,Read Back By and Verified With: RN B. HALE ON 01/09/12 AT 1640 BY DTERRY   Report Status PENDING   Incomplete   CULTURE, BLOOD (ROUTINE X 2)     Status: Normal (Preliminary result)   Collection Time   01/08/12 11:15 PM      Component Value Range Status Comment   Specimen Description BLOOD RIGHT FINGER   Final    Special Requests BOTTLES DRAWN AEROBIC ONLY [facility]   Final    Culture  Setup Time 01/09/2012 03:57   Final    Culture     Final    Value: GRAM NEGATIVE RODS     Note: Gram Stain Report Called to,Read Back By and Verified With: RN B. HALE ON 01/09/12 AT 1640 BY DTERRY   Report Status PENDING   Incomplete   MRSA PCR SCREENING     Status: Normal   Collection Time   01/09/12  2:38 AM      Component Value Range Status Comment   MRSA by PCR NEGATIVE  NEGATIVE Final   CULTURE, EXPECTORATED SPUTUM-ASSESSMENT     Status: Normal   Collection Time   01/09/12  6:50 AM      Component Value Range Status Comment   Specimen Description SPUTUM   Final    Special Requests NONE   Final    Sputum evaluation     Final    Value: THIS SPECIMEN IS ACCEPTABLE. RESPIRATORY CULTURE REPORT TO FOLLOW.   Report Status 01/09/2012 FINAL   Final   CULTURE, RESPIRATORY     Status: Normal (Preliminary result)   Collection Time   01/09/12  6:50 AM      Component Value Range Status Comment   Specimen Description SPUTUM   Final    Special Requests NONE   Final    Gram Stain PENDING   Incomplete    Culture Culture reincubated for better growth   Final    Report Status PENDING   Incomplete   CLOSTRIDIUM DIFFICILE BY PCR     Status: Abnormal   Collection Time   01/09/12 12:58 PM      Component Value Range Status Comment   C difficile by pcr POSITIVE (*) NEGATIVE Final      Studies: Dg Abd 1 View  12/27/2011  *RADIOLOGY REPORT*  Clinical Data: Confirmed G-tube placement.  ABDOMEN - 1 VIEW  Comparison: No priors.   Findings: A single radiograph of the abdomen was obtained after infusion of 30 ml of Omnipaque-300 into the patient's percutaneous enterogastric tube.  The injected contrast opacifies the stomach. Gas and stool is seen scattered throughout the colon extending to the level of the rectum.  No pathologic distension of small bowel is noted.  Multiple surgical clips and coils project over the  upper abdomen.  IMPRESSION:  1.  Tip of the percutaneous enterogastric tube is within the lumen of the stomach.  Original Report Authenticated By: Florencia Reasons, M.D.   Ir Fluoro Guide Cv Line Left  12/25/2011  *RADIOLOGY REPORT*  Clinical Data: Chronic TPN and needs new PICC line  PICC LINE PLACEMENT WITH ULTRASOUND AND FLUOROSCOPIC  GUIDANCE  Fluoroscopy Time: 0.6 minutes.  The left arm was prepped with chlorhexidine, draped in the usual sterile fashion using maximum barrier technique (cap and mask, sterile gown, sterile gloves, large sterile sheet, hand hygiene and cutaneous antisepsis) and infiltrated locally with 1% Lidocaine.  Ultrasound demonstrated patency of the left brachial vein, and this was documented with an image.  Under real-time ultrasound guidance, this vein was accessed with a 21 gauge micropuncture needle and image documentation was performed.  The needle was exchanged over a guidewire for a peel-away sheath through which a five [place] dual lumen PICC trimmed to 43 cm was advanced, positioned with its tip at the lower SVC/right atrial junction.  Fluoroscopy during the procedure and fluoro spot radiograph confirms appropriate catheter position.  The catheter was flushed, secured to the skin with Prolene sutures, and covered with a sterile dressing.  Complications:  None  IMPRESSION: Successful left arm PICC line placement with ultrasound and fluoroscopic guidance.  The catheter is ready for use.  Original Report Authenticated By: Richarda Overlie, M.D.   Ir Replc Gastro/colonic Tube Percut W/fluoro  12/28/2011   *RADIOLOGY REPORT*  Clinical Data: Chronic tube feeds, accidental removal  GASTROSTOMY CATHETER REPLACEMENT  Comparison: 12/27/2011  Findings: Under sterile conditions, the existing percutaneous tract was cannulated with a 40 cm Kumpe catheter.  Catheter advanced easily into the stomach lumen.  This was confirmed with contrast injection.  Images obtained documentation.  Amplatz guide wire inserted.  Tract dilatation performed to advance an 18-French balloon retention gastrostomy.  Balloon tip was inflated with 10 ml saline containing 1 ml contrast.  This was retracted against into gastric wall.  Contrast injection reconfirms position.  Gastrostomy ready for use.  IMPRESSION: Fluoroscopic replacement of an 18-French balloon retention gastrostomy.  Confirmed in the stomach ready for use.  Original Report Authenticated By: Judie Petit. Ruel Favors, M.D.   Ir US Guide Vasc Access Left  12/25/2011  *RADIOLOGY REPORT*  Clinical Data: Chronic TPN and needs new PICC line  PICC LINE PLACEMENT WITH ULTRASOUND AND FLUOROSCOPIC  GUIDANCE  Fluoroscopy Time: 0.6 minutes.  The left arm was prepped with chlorhexidine, draped in the usual sterile fashion using maximum barrier technique (cap and mask, sterile gown, sterile gloves, large sterile sheet, hand hygiene and cutaneous antisepsis) and infiltrated locally with 1% Lidocaine.  Ultrasound demonstrated patency of the left brachial vein, and this was documented with an image.  Under real-time ultrasound guidance, this vein was accessed with a 21 gauge micropuncture needle and image documentation was performed.  The needle was exchanged over a guidewire for a peel-away sheath through which a five [place] dual lumen PICC trimmed to 43 cm was advanced, positioned with its tip at the lower SVC/right atrial junction.  Fluoroscopy during the procedure and fluoro spot radiograph confirms appropriate catheter position.  The catheter was flushed, secured to the skin with Prolene sutures, and  covered with a sterile dressing.  Complications:  None  IMPRESSION: Successful left arm PICC line placement with ultrasound and fluoroscopic guidance.  The catheter is ready for use.  Original Report Authenticated By: Richarda Overlie, M.D.   Dg Chest Portable 1 View  01/08/2012  *RADIOLOGY REPORT*  Clinical Data: Fever, hypoxia.  PORTABLE CHEST - 1 VIEW  Comparison: 12/20/2011  Findings: Hypoaeration results in interstitial and vascular crowding.  Mild lung base opacities, left greater than right.  No pneumothorax or pleural effusion.  Heart size upper normal to mildly enlarged.  Aortic arch atherosclerosis.  Diffuse osteopenia. No acute osseous change identified.  IMPRESSION: Hypoaeration results in interstitial and vascular crowding.  Mild lung base opacity; atelectasis versus infiltrate.  Original Report Authenticated By: Waneta Martins, M.D.   Dg Chest Port 1 View  12/20/2011  *RADIOLOGY REPORT*  Clinical Data: Fever.  History of diabetes hypertension.  PORTABLE CHEST - 1 VIEW 12/20/2011 1851 hours:  Comparison: Portable chest x-ray 11/21/2011, 10/13/2011, 05/19/2011.  Findings: Suboptimal inspiration due to body habitus which accounts for atelectasis at the bases and accentuates the cardiac silhouette.  Taking this into account, cardiac silhouette moderately enlarged but stable.  Pulmonary vascularity normal without evidence of pulmonary edema.  Lungs otherwise clear.  IMPRESSION: Suboptimal inspiration accounts for bibasilar atelectasis.  Stable cardiomegaly without pulmonary edema.  Original Report Authenticated By: Arnell Sieving, M.D.    Scheduled Meds:   . sodium chloride   Intravenous STAT  . amLODipine  10 mg Per Tube Daily  . aztreonam  2 g Intravenous Q8H  . cloNIDine  0.2 mg Transdermal Weekly  . darifenacin  15 mg Oral Daily  . enoxaparin  80 mg Subcutaneous Q12H  . feeding supplement (JEVITY 1.2 CAL)  1,000 mL Per Tube Q24H  . fentaNYL  50 mcg Transdermal Q72H  . insulin aspart   0-9 Units Subcutaneous Q4H  . levofloxacin (LEVAQUIN) IV  750 mg Intravenous Q24H  . metoprolol  50 mg Per Tube BID  . pantoprazole  40 mg Oral Q1200  . vancomycin  125 mg Oral QID   Followed by  . vancomycin  125 mg Oral BID   Followed by  . vancomycin  125 mg Oral Daily   Followed by  . vancomycin  125 mg Oral QODAY   Followed by  . vancomycin  125 mg Oral Q3 days  . vancomycin  750 mg Intravenous Q12H  . DISCONTD: aztreonam  2 g Intravenous Q8H  . DISCONTD: levofloxacin (LEVAQUIN) IV  750 mg Intravenous Q24H   Continuous Infusions:   . sodium chloride 20 mL/hr (01/10/12 0606)    Principal Problem:  *PNA (pneumonia) Active Problems:  DIABETES MELLITUS, TYPE II  HYPERLIPIDEMIA  DEPRESSION  HYPERTENSION  Anemia  Perforated prepyloric gastric ulcer s/p antrectomy with Billroth II reconstruction  Dementia     [name]  [facility] Pager [phone]. If 8PM-8AM, please contact night-coverage at [email or web address], password [facility] 01/10/2012, 11:57 AM  LOS: 2 days

## 2012-01-10 NOTE — Progress Notes (Signed)
INITIAL ADULT NUTRITION ASSESSMENT Date: 01/10/2012   Time: 2:20 PM Reason for Assessment: TF management consult  INTERVENTION:  Increase Jevity 1.2 to goal rate of 45 ml/hr with 30 ml Prostat daily.   Above regimen will provide: 1396 kcal, 75 grams protein, and 875 ml H2O.  200 ml H2O QID; Total free water: 1675 ml  ASSESSMENT: Female 78 y.o.  Dx: Bacteremia  Hx:  Past Medical History  Diagnosis Date  . Hypertension   . Reflux   . Right elbow pain     OTIF  . Dementia   . Depression   . Osteoarthritis   . Pancreatitis 11/2007    HOP  . Angiomyolipoma of kidney     right  . Ulcerative esophagitis 12/05/2007    hx elevated gastrin, severe on EGD by Dr Jena Gauss , h pylori negative  . Hiatal hernia   . S/P colonoscopy 2009    pt reports normal by Dr Lovell Sheehan  . Diabetes mellitus   . Anemia   . Hyponatremia   . Cellulitis   . Esophagitis   . Peptic ulcer   . Insomnia   . Sepsis   . Respiratory failure   . Sepsis   . Clostridium difficile infection   . Esophagitis   . Depression   . Encephalitis   . Urinary tract infection   . Bacteremia   . 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 Perforated prepyloric gastric ulcer s/p antrectomy with Billroth II reconstruction 01/30/2011   Related Meds:     . sodium chloride   Intravenous STAT  . amLODipine  10 mg Per Tube Daily  . aztreonam  2 g Intravenous Q8H  . cloNIDine  0.2 mg Transdermal Weekly  . darifenacin  15 mg Oral Daily  . enoxaparin  80 mg Subcutaneous Q12H  . feeding supplement (JEVITY 1.2 CAL)  1,000 mL Per Tube Q24H  . fentaNYL  50 mcg Transdermal Q72H  . insulin aspart  0-9 Units Subcutaneous Q4H  . metoprolol  50 mg Per Tube BID  . pantoprazole  40 mg Oral Q1200  . vancomycin  125 mg Oral QID   Followed by  . vancomycin  125 mg Oral BID   Followed by  . vancomycin  125 mg Oral Daily   Followed by  . vancomycin  125 mg Oral QODAY   Followed by  . vancomycin  125  mg Oral Q3 days  . vancomycin  750 mg Intravenous Q12H  . DISCONTD: aztreonam  2 g Intravenous Q8H  . DISCONTD: levofloxacin (LEVAQUIN) IV  750 mg Intravenous Q24H  . DISCONTD: levofloxacin (LEVAQUIN) IV  750 mg Intravenous Q24H   Ht: 5' 6.5" (168.9 cm)  Wt: 166 lb 10.7 oz (75.6 kg)  Ideal Wt: 60.2 kg % Ideal Wt: 126%  Usual Wt:  Wt Readings from Last 10 Encounters:  01/10/12 166 lb 10.7 oz (75.6 kg)  12/24/11 167 lb 4.8 oz (75.887 kg)  12/01/11 165 lb 12 oz (75.184 kg)  11/06/11 163 lb 12.8 oz (74.299 kg)  10/27/11 184 lb 8.4 oz (83.7 kg)  10/27/11 184 lb 8.4 oz (83.7 kg)  08/17/11 162 lb (73.483 kg)  07/29/11 162 lb (73.483 kg)  07/29/11 162 lb (73.483 kg)  07/20/11 162 lb (73.483 kg)   % Usual Wt: 100%  Body mass index is 26.50 kg/(m^2). Overweight  Food/Nutrition Related Hx: Pt unable to answer questions. Pt has dementia. Pt with PEG. Hx of being on TPN. Started  on PEG TF last admission 11/2011. Weight appears to be stable.   Labs:  CMP     Component Value Date/Time   NA 129* 01/09/2012 0619   K 3.8 01/09/2012 0619   CL 100 01/09/2012 0619   CO2 19 01/09/2012 0619   GLUCOSE 144* 01/09/2012 0619   BUN 20 01/09/2012 0619   CREATININE 0.72 01/09/2012 0619   CALCIUM 8.1* 01/09/2012 0619   PROT 7.4 01/08/2012 2305   ALBUMIN 3.0* 01/08/2012 2305   AST 27 01/08/2012 2305   ALT 22 01/08/2012 2305   ALKPHOS 167* 01/08/2012 2305   BILITOT 0.2* 01/08/2012 2305   GFRNONAA 80* 01/09/2012 0619   GFRAA >90 01/09/2012 0619   CBG (last 3)   Basename 01/10/12 1215 01/10/12 0824 01/10/12 0403  GLUCAP 110* 120* 107*    Phosphorus  Date/Time Value Range Status  12/21/2011  4:00 AM 4.6  2.3 - 4.6 mg/dL Final  1/61/0960  4:54 AM 4.5  2.3 - 4.6 mg/dL Final  0/98/1191  4:78 AM 4.6  2.3 - 4.6 mg/dL Final    Intake/Output Summary (Last 24 hours) at 01/10/12 1422 Last data filed at 01/09/12 1440  Gross per 24 hour  Intake      0 ml  Output      0 ml  Net      0 ml   Diet Order:  NPO  Supplements/Tube Feeding: Jevity 1.2  IVF:    sodium chloride Last Rate: 20 mL/hr (01/10/12 0606)   Patient has PEG in place.  Jevity 1.2 is infusing @ 30 ml/hr.  Tube feeding regimen currently providing 864 kcal, 40 grams protein, and 583 ml H2O.   Pt with current c diff infection.  Estimated Nutritional Needs:   Kcal:  1300-1450 Protein:  70-80 grams protein Fluid:  >1.5 L/day  NUTRITION DIAGNOSIS: -Inadequate oral intake (NI-2.1).  Status: Ongoing  RELATED TO: inability to eat  AS EVIDENCE BY: NPO status  MONITORING/EVALUATION(Goals): Goal: Pt to meet >/= 90% of their estimated nutrition needs. Monitor: TF tolerance-can change to vital product if needed, labs, weight  EDUCATION NEEDS: -No education needs identified at this time   DOCUMENTATION CODES Per approved criteria  -Not Applicable    Kendell Bane RD, LDN, CNSC [phone] Weekend/After Hours Pager  01/10/2012, 2:20 PM

## 2012-01-11 DIAGNOSIS — R509 Fever, unspecified: Secondary | ICD-10-CM

## 2012-01-11 DIAGNOSIS — D649 Anemia, unspecified: Secondary | ICD-10-CM

## 2012-01-11 LAB — CULTURE, BLOOD (ROUTINE X 2)

## 2012-01-11 LAB — GLUCOSE, CAPILLARY
Glucose-Capillary: 102 mg/dL — ABNORMAL HIGH (ref 70–99)
Glucose-Capillary: 110 mg/dL — ABNORMAL HIGH (ref 70–99)
Glucose-Capillary: 112 mg/dL — ABNORMAL HIGH (ref 70–99)
Glucose-Capillary: 119 mg/dL — ABNORMAL HIGH (ref 70–99)
Glucose-Capillary: 127 mg/dL — ABNORMAL HIGH (ref 70–99)

## 2012-01-11 LAB — CBC
HCT: 26.7 % — ABNORMAL LOW (ref 36.0–46.0)
Hemoglobin: 8.6 g/dL — ABNORMAL LOW (ref 12.0–15.0)
MCHC: 32.2 g/dL (ref 30.0–36.0)

## 2012-01-11 LAB — BASIC METABOLIC PANEL
BUN: 8 mg/dL (ref 6–23)
Chloride: 102 mEq/L (ref 96–112)
Glucose, Bld: 106 mg/dL — ABNORMAL HIGH (ref 70–99)
Potassium: 3.1 mEq/L — ABNORMAL LOW (ref 3.5–5.1)

## 2012-01-11 MED ORDER — HYDRALAZINE HCL 20 MG/ML IJ SOLN: 10.0000 mg | Freq: Four times a day (QID) | INTRAMUSCULAR | Status: DC | PRN

## 2012-01-11 MED ORDER — SODIUM CHLORIDE 0.9 % IV SOLN
500.0000 mg | Freq: Three times a day (TID) | INTRAVENOUS | Status: DC
  Administered 2012-01-11 – 2012-01-14 (×9): 500 mg via INTRAVENOUS
  Filled 2012-01-11 (×11): qty 500

## 2012-01-11 MED ORDER — POTASSIUM CHLORIDE 10 MEQ/100ML IV SOLN
10.0000 meq | INTRAVENOUS | Status: AC
  Administered 2012-01-11 (×3): 10 meq via INTRAVENOUS
  Filled 2012-01-11 (×3): qty 100

## 2012-01-11 NOTE — Progress Notes (Signed)
ANTIBIOTIC CONSULT NOTE - INITIAL  Pharmacy Consult for Primaxin Indication: blood culture--ESBL  Allergies  Allergen Reactions  . Other Swelling    Pinto beans cause swelling and hives  . Penicillins Swelling    Patient Measurements: Height: 5' 6.5" (168.9 cm) Weight: 161 lb 11.2 oz (73.347 kg) IBW/kg (Calculated) : 60.45  Adjusted Body Weight:   Vital Signs: Temp: 98.6 F (37 C) (07/15 1400) Temp src: Oral (07/15 1400) BP: 170/82 mmHg (07/15 1400) Pulse Rate: 85  (07/15 1400) Intake/Output from previous day: 07/14 0701 - 07/15 0700 In: -  Out: 2100 [Urine:2100] Intake/Output from this shift: Total I/O In: -  Out: 400 [Urine:400]  Labs:  [facility] 01/11/12 0510 01/09/12 0619 01/08/12 2305  WBC 3.8* 6.8 3.9*  HGB 8.6* 8.4* 10.4*  PLT 266 219 235  LABCREA -- -- --  CREATININE 0.49* 0.72 0.63   Estimated Creatinine Clearance: 60 ml/min (by C-G formula based on Cr of 0.49). No results found for this basename: VANCOTROUGH:2,VANCOPEAK:2,VANCORANDOM:2,GENTTROUGH:2,GENTPEAK:2,GENTRANDOM:2,TOBRATROUGH:2,TOBRAPEAK:2,TOBRARND:2,AMIKACINPEAK:2,AMIKACINTROU:2,AMIKACIN:2, in the last 72 hours   Microbiology: Recent Results (from the past 720 hour(s))  URINE CULTURE     Status: Normal   Collection Time   12/20/11  7:03 PM      Component Value Range Status Comment   Specimen Description URINE, RANDOM   Final    Special Requests NONE   Final    Culture  Setup Time [id number]   Final    Colony Count NO GROWTH   Final    Culture NO GROWTH   Final    Report Status 12/22/2011 FINAL   Final   CATH TIP CULTURE     Status: Normal   Collection Time   12/21/11  3:43 AM      Component Value Range Status Comment   Specimen Description CATH TIP   Final    Special Requests NONE   Final    Culture     Final    Value: 30 COLONIES STAPHYLOCOCCUS SPECIES (COAGULASE NEGATIVE)     Note: RIFAMPIN AND GENTAMICIN SHOULD NOT BE USED AS SINGLE DRUGS FOR TREATMENT OF STAPH INFECTIONS.   Report Status 12/25/2011 FINAL   Final    Organism ID, Bacteria STAPHYLOCOCCUS SPECIES (COAGULASE NEGATIVE)   Final   CLOSTRIDIUM DIFFICILE BY PCR     Status: Abnormal   Collection Time   12/22/11  7:18 PM      Component Value Range Status Comment   C difficile by pcr POSITIVE (*) NEGATIVE Final   MRSA PCR SCREENING     Status: Normal   Collection Time   12/23/11  2:55 AM      Component Value Range Status Comment   MRSA by PCR NEGATIVE  NEGATIVE Final   CULTURE, BLOOD (ROUTINE X 2)     Status: Normal   Collection Time   01/08/12 11:05 PM      Component Value Range Status Comment   Specimen Description BLOOD RIGHT ANTECUBITAL   Final    Special Requests BOTTLES DRAWN AEROBIC AND ANAEROBIC 5CC   Final    Culture  Setup Time 01/09/2012 03:57   Final    Culture     Final    Value: KLEBSIELLA PNEUMONIAE     Note: Confirmed Extended Spectrum Beta-Lactamase Producer (ESBL)     Note: Gram Stain Report Called to,Read Back By and Verified With: RN B. HALE ON 01/09/12 AT 1640 BY DTERRY   Report Status 01/11/2012 FINAL   Final    Organism ID, Bacteria KLEBSIELLA PNEUMONIAE  Final   CULTURE, BLOOD (ROUTINE X 2)     Status: Normal   Collection Time   01/08/12 11:15 PM      Component Value Range Status Comment   Specimen Description BLOOD RIGHT FINGER   Final    Special Requests BOTTLES DRAWN AEROBIC ONLY [facility]   Final    Culture  Setup Time 01/09/2012 03:57   Final    Culture     Final    Value: KLEBSIELLA PNEUMONIAE     Note: SUSCEPTIBILITIES PERFORMED ON PREVIOUS CULTURE WITHIN THE LAST 5 DAYS.     Note: Gram Stain Report Called to,Read Back By and Verified With: RN B. HALE ON 01/09/12 AT 1640 BY DTERRY   Report Status 01/11/2012 FINAL   Final   MRSA PCR SCREENING     Status: Normal   Collection Time   01/09/12  2:38 AM      Component Value Range Status Comment   MRSA by PCR NEGATIVE  NEGATIVE Final   CULTURE, EXPECTORATED SPUTUM-ASSESSMENT     Status: Normal   Collection Time   01/09/12  6:50  AM      Component Value Range Status Comment   Specimen Description SPUTUM   Final    Special Requests NONE   Final    Sputum evaluation     Final    Value: THIS SPECIMEN IS ACCEPTABLE. RESPIRATORY CULTURE REPORT TO FOLLOW.   Report Status 01/09/2012 FINAL   Final   CULTURE, RESPIRATORY     Status: Normal (Preliminary result)   Collection Time   01/09/12  6:50 AM      Component Value Range Status Comment   Specimen Description SPUTUM   Final    Special Requests NONE   Final    Gram Stain PENDING   Incomplete    Culture     Final    Value: NORMAL OROPHARYNGEAL FLORA     Note: FINAL WHEN GS IS COMPLETE   Report Status PENDING   Incomplete   CLOSTRIDIUM DIFFICILE BY PCR     Status: Abnormal   Collection Time   01/09/12 12:58 PM      Component Value Range Status Comment   C difficile by pcr POSITIVE (*) NEGATIVE Final     Medical History: Past Medical History  Diagnosis Date  . Hypertension   . Reflux   . Right elbow pain     OTIF  . Dementia   . Depression   . Osteoarthritis   . Pancreatitis 11/2007    HOP  . Angiomyolipoma of kidney     right  . Ulcerative esophagitis 12/05/2007    hx elevated gastrin, severe on EGD by Dr Jena Gauss , h pylori negative  . Hiatal hernia   . S/P colonoscopy 2009    pt reports normal by Dr Lovell Sheehan  . Diabetes mellitus   . Anemia   . Hyponatremia   . Cellulitis   . Esophagitis   . Peptic ulcer   . Insomnia   . Sepsis   . Respiratory failure   . Sepsis   . Clostridium difficile infection   . Esophagitis   . Depression   . Encephalitis   . Urinary tract infection   . Bacteremia   . 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 Perforated prepyloric gastric ulcer s/p antrectomy with Billroth II reconstruction 01/30/2011    Medications:  Scheduled:    . amLODipine  10 mg Per Tube Daily  . cloNIDine  0.2 mg Transdermal Weekly  . darifenacin  15 mg Oral Daily  . enoxaparin  80 mg Subcutaneous Q12H  .  feeding supplement  30 mL Per Tube Daily  . fentaNYL  50 mcg Transdermal Q72H  . free water  200 mL Per Tube QID  . imipenem-cilastatin  500 mg Intravenous Q8H  . insulin aspart  0-9 Units Subcutaneous Q4H  . metoprolol  50 mg Per Tube BID  . pantoprazole  40 mg Oral Q1200  . potassium chloride  10 mEq Intravenous Q1 Hr x 3  . vancomycin  125 mg Oral QID   Followed by  . vancomycin  125 mg Oral BID   Followed by  . vancomycin  125 mg Oral Daily   Followed by  . vancomycin  125 mg Oral QODAY   Followed by  . vancomycin  125 mg Oral Q3 days  . DISCONTD: aztreonam  2 g Intravenous Q8H  . DISCONTD: vancomycin  750 mg Intravenous Q12H   Infusions:    . sodium chloride 20 mL/hr (01/10/12 0606)  . feeding supplement (JEVITY 1.2 CAL) 1,000 mL (01/10/12 1530)   PRN: acetaminophen, oxyCODONE, traZODone Assessment: 78 YO female now with infection (bl culture growing ESBL  Producing isolate of Klebsiella pneumoniae )  Goal of Therapy:  Eradication of infection  Plan:  Begin Primaxin 500mg  IV q 8 hours Follow up culture results  Loletta Specter 01/11/2012,3:37 PM

## 2012-01-11 NOTE — Clinical Documentation Improvement (Signed)
PNEUMONIA DOCUMENTATION CLARIFICATION QUERY  THIS DOCUMENT IS NOT A PERMANENT PART OF THE MEDICAL RECORD  TO RESPOND TO THE THIS QUERY, FOLLOW THE INSTRUCTIONS BELOW:  1. If needed, update documentation for the patient's encounter via the notes activity.  2. Access this query again and click edit on the [employer].  3. After updating, or not, click F2 to complete all highlighted (required) fields concerning your review. Select "additional documentation in the medical record" OR "no additional documentation provided".  4. Click Sign note button.  5. The deficiency will fall out of your InBasket *Please let us know if you are not able to complete this workflow by phone or e-mail (listed below).  Please update your documentation within the medical record to reflect your response to this query.                                                                                    01/11/12  Dear Dr.Arantxa Piercey / Associates  In a better effort to capture your patient's severity of illness, reflect appropriate length of stay and utilization of resources, a review of the patient medical record has revealed the following indicators.    Based on your clinical judgment, please clarify and document in a progress note and/or discharge summary the clinical condition associated with the following supporting information:  In responding to this query please exercise your independent judgment.  The fact that a query is asked, does not imply that any particular answer is desired or expected. Pt  with PNA. According to  blood culture drawn on 01/08/2012 KLEBSIELLA PNEUMONIAE /GRAM NEGATIVE RODS  For accurate  specificity & severity, if possible , please specify if " pneumonia" could be further clarified in progress notes as any of listings below  Possible Clinical Conditions?  Gram Negative Pneumonia (POA?)  Bacterial pneumonia, specify type if known (POA?)  Klebsiella PNA  E Coli PNA Staph/MRSA PNA Strep  PNA Pneumococcal PNA  Viral PNA (POA?)  Pneumonia (CAP, HAP) (POA?)  Other Condition (please specify)  Cannot Clinically Determine  Supporting Information: Risk Factors: pneumonia, History of  Respiratory failure,  Sepsis,Bacteremia    Signs & Symptoms: per pn 01/10/2012 >"Gram negative rod bacteremia- on triple abx for ? PNA"  "has gram neg bacteremia needing IV abx" H&P: "white count of only 3.9 but with an 85% shift. She documented temperature of 104 rectally " Diagnostics: -Radiology: 01/08/2012     IMPRESSION: Hypoaeration results in interstitial and vascular crowding.  Mild lung base opacity; atelectasis versus infiltrate - Microbiology: -Blood C&S: Gram negative rod  - Treatments: -Current ABX:  Aztreonam IVQ 8hr, Vancomycin IV QID,  LEVAQUIN IV  q 24hr  -O2 per nasal cannula    You may use possible, probable, or suspect with inpatient documentation. possible, probable, suspected diagnoses MUST be documented at the time of discharge  Reviewed: additional documentation in the medical record  Thank You,  Andy Gauss RN  Clinical Documentation Specialist:  Pager [phone] E-mail [email or web address]   Health Information Management [place]

## 2012-01-11 NOTE — Consult Note (Signed)
Date of Admission:  01/08/2012  Date of Consult:  01/11/2012  Reason for Consult: ESBL Bacteremia (Kleb pneumoniae) Referring Physician: Benjamine Mola  Impression/Recommendation Klebsiella pneumoniae  (ESBL) Bacteremia Would- continue imipenem Continue vancomycin taper  Comment- plan for 2 weeks of carbapenem therapy. Could be changed to Invanz at d/c if wishing to get back to SNF Plan for 6 weeks of vanco po. The source of her bacteremia is ? Urine? Her lungs appear clear and her CXR 7-14 is clear.   Will follow with you. Thank you so much for this interesting consult,   [name] [phone]  [name] is an 78 y.o. female.  HPI: [name] with hx of DM, DVT, Bilroth II after perforated gastric ulcer, recently hospitalized  (6-23 to 28) for C diff and possible PIC infection (BCx not done, cath tip MRSE ).  Returns to [facility] 7-13 with hypoxia, temp 104, WBC 3.9, and possible HCAP (CXR- lung base opacity, infiltrate vs atx). She was started on vanco/aztreonam/levaquin. She defervesced in the hospital. Her BCx have since returned 2/2 Kleb pneumo (ESBL). She was again C diff pcr+ and started on vancomycin taper .    Past Medical History  Diagnosis Date  . Hypertension   . Reflux   . Right elbow pain     OTIF  . Dementia   . Depression   . Osteoarthritis   . Pancreatitis 11/2007    HOP  . Angiomyolipoma of kidney     right  . Ulcerative esophagitis 12/05/2007    hx elevated gastrin, severe on EGD by Dr Jena Gauss , h pylori negative  . Hiatal hernia   . S/P colonoscopy 2009    pt reports normal by Dr Lovell Sheehan  . Diabetes mellitus   . Anemia   . Hyponatremia   . Cellulitis   . Esophagitis   . Peptic ulcer   . Insomnia   . Sepsis   . Respiratory failure   . Sepsis   . Clostridium difficile infection   . Esophagitis   . Depression   . Encephalitis   . Urinary tract infection   . Bacteremia   . 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 Perforated  prepyloric gastric ulcer s/p antrectomy with Billroth II reconstruction 01/30/2011    Past Surgical History  Procedure Date  . Orif right hip 1999    [facility]  . Umbilical hernia repair 49 months old    [place]  . Esophagogastroduodenoscopy 01/28/2011    Procedure: ESOPHAGOGASTRODUODENOSCOPY (EGD);  Surgeon: Arlyce Harman, MD;  Location: [facility];  Service: Endoscopy;  Laterality: N/A;  . Colonoscopy 01/28/2011    Procedure: COLONOSCOPY;  Surgeon: Arlyce Harman, MD;  Location: [facility];  Service: Endoscopy;  Laterality: N/A;  . Laparotomy 02/04/2011    Procedure: EXPLORATORY LAPAROTOMY;  Surgeon: Fabio Bering;  Location: [facility] ORS;  Service: General;  Laterality: N/A;  . Laparotomy 05/07/2011    Procedure: EXPLORATORY LAPAROTOMY;  Surgeon: Ardeth Sportsman, MD;  Location: [facility] OR;  Service: General;  Laterality: N/A;  lysis of adhesions  . Gastrostomy 05/07/2011    Procedure: GASTROSTOMY;  Surgeon: Ardeth Sportsman, MD;  Location: [facility] OR;  Service: General;  Laterality: N/A;  g-tube / j -tube placement  . Gastrectomy 05/07/2011    Procedure: GASTRECTOMY;  Surgeon: Ardeth Sportsman, MD;  Location: [facility] OR;  Service: General;  Laterality: N/A;  Partial gastrectomy  . Cholecystectomy 05/07/2011    Procedure: CHOLECYSTECTOMY;  Surgeon: Ardeth Sportsman, MD;  Location[facility] OR;  Service: General;  Laterality: N/A;  open  . Tee without cardioversion 10/21/2011    Procedure: TRANSESOPHAGEAL ECHOCARDIOGRAM (TEE);  Surgeon: Wendall Stade, MD;  Location: [facility] ENDOSCOPY;  Service: Cardiovascular;  Laterality: N/A;  ergies:   Allergies  Allergen Reactions  . Other Swelling    Pinto beans cause swelling and hives  . Penicillins Swelling    Medications:  Scheduled:   . amLODipine  10 mg Per Tube Daily  . cloNIDine  0.2 mg Transdermal Weekly  . darifenacin  15 mg Oral Daily  . enoxaparin  80 mg Subcutaneous Q12H  . feeding supplement  30 mL Per Tube Daily  . fentaNYL  50 mcg Transdermal Q72H  . free  water  200 mL Per Tube QID  . imipenem-cilastatin  500 mg Intravenous Q8H  . insulin aspart  0-9 Units Subcutaneous Q4H  . metoprolol  50 mg Per Tube BID  . pantoprazole  40 mg Oral Q1200  . potassium chloride  10 mEq Intravenous Q1 Hr x 3  . vancomycin  125 mg Oral QID   Followed by  . vancomycin  125 mg Oral BID   Followed by  . vancomycin  125 mg Oral Daily   Followed by  . vancomycin  125 mg Oral QODAY   Followed by  . vancomycin  125 mg Oral Q3 days  . DISCONTD: aztreonam  2 g Intravenous Q8H  . DISCONTD: vancomycin  750 mg Intravenous Q12H    Social History:  reports that she has never smoked. Her smokeless tobacco use includes Chew. She reports that she does not drink alcohol or use illicit drugs.  Family History  Problem Relation Age of Onset  . Cancer Mother     pelvic     General ROS: states she has loose BM BIW, normal urination (no burning), see HPI.  Blood pressure 170/82, pulse 85, temperature 98.6 F (37 C), temperature source Oral, resp. rate 20, height 5' 6.5" (1.689 m), weight 73.347 kg (161 lb 11.2 oz), SpO2 95.00%. General appearance: alert, cooperative and no distress Eyes: negative findings: conjunctivae and sclerae normal and pupils equal, round, reactive to light and accomodation Throat: normal findings: oropharynx pink & moist without lesions or evidence of thrush and abnormal findings: edentulous Neck: no adenopathy, supple, symmetrical, trachea midline and thyroid not enlarged, symmetric, no tenderness/mass/nodules Lungs: clear to auscultation bilaterally Heart: regular rate and rhythm Abdomen: normal findings: bowel sounds normal and soft, non-tender Extremities: edema trace; onychomycosis Neurologic: Grossly normal light touch in LE LUQ PEG site is clean, non-tender, no d/c. Her LUE has small wounds. The site of her previous PIC has no d/c or fluctuance.   Results for orders placed during the hospital encounter of 01/08/12 (from the past 48  hour(s))  GLUCOSE, CAPILLARY     Status: Abnormal   Collection Time   01/09/12  8:17 PM      Component Value Range Comment   Glucose-Capillary 118 (*) 70 - 99 mg/dL   GLUCOSE, CAPILLARY     Status: Abnormal   Collection Time   01/09/12 11:55 PM      Component Value Range Comment   Glucose-Capillary 105 (*) 70 - 99 mg/dL   GLUCOSE, CAPILLARY     Status: Abnormal   Collection Time   01/10/12  4:03 AM      Component Value Range Comment   Glucose-Capillary 107 (*) 70 - 99 mg/dL   GLUCOSE, CAPILLARY  Status: Abnormal   Collection Time   01/10/12  8:24 AM      Component Value Range Comment   Glucose-Capillary 120 (*) 70 - 99 mg/dL   GLUCOSE, CAPILLARY     Status: Abnormal   Collection Time   01/10/12 12:15 PM      Component Value Range Comment   Glucose-Capillary 110 (*) 70 - 99 mg/dL   GLUCOSE, CAPILLARY     Status: Abnormal   Collection Time   01/10/12  4:39 PM      Component Value Range Comment   Glucose-Capillary 107 (*) 70 - 99 mg/dL   GLUCOSE, CAPILLARY     Status: Normal   Collection Time   01/10/12  8:33 PM      Component Value Range Comment   Glucose-Capillary 94  70 - 99 mg/dL   GLUCOSE, CAPILLARY     Status: Abnormal   Collection Time   01/11/12 12:03 AM      Component Value Range Comment   Glucose-Capillary 112 (*) 70 - 99 mg/dL   GLUCOSE, CAPILLARY     Status: Abnormal   Collection Time   01/11/12  4:22 AM      Component Value Range Comment   Glucose-Capillary 119 (*) 70 - 99 mg/dL   CBC     Status: Abnormal   Collection Time   01/11/12  5:10 AM      Component Value Range Comment   WBC 3.8 (*) 4.0 - 10.5 K/uL    RBC 3.44 (*) 3.87 - 5.11 MIL/uL    Hemoglobin 8.6 (*) 12.0 - 15.0 g/dL    HCT 16.1 (*) 09.6 - 46.0 %    MCV 77.6 (*) 78.0 - 100.0 fL    MCH 25.0 (*) 26.0 - 34.0 pg    MCHC 32.2  30.0 - 36.0 g/dL    RDW 04.5 (*) 40.9 - 15.5 %    Platelets 266  150 - 400 K/uL   BASIC METABOLIC PANEL     Status: Abnormal   Collection Time   01/11/12  5:10 AM       Component Value Range Comment   Sodium 134 (*) 135 - 145 mEq/L    Potassium 3.1 (*) 3.5 - 5.1 mEq/L DELTA CHECK NOTED   Chloride 102  96 - 112 mEq/L    CO2 23  19 - 32 mEq/L    Glucose, Bld 106 (*) 70 - 99 mg/dL    BUN 8  6 - 23 mg/dL DELTA CHECK NOTED   Creatinine, Ser 0.49 (*) 0.50 - 1.10 mg/dL    Calcium 8.7  8.4 - 81.1 mg/dL    GFR calc non Af Amer >90  >90 mL/min    GFR calc Af Amer >90  >90 mL/min   GLUCOSE, CAPILLARY     Status: Abnormal   Collection Time   01/11/12  8:26 AM      Component Value Range Comment   Glucose-Capillary 130 (*) 70 - 99 mg/dL   GLUCOSE, CAPILLARY     Status: Abnormal   Collection Time   01/11/12 12:17 PM      Component Value Range Comment   Glucose-Capillary 102 (*) 70 - 99 mg/dL   GLUCOSE, CAPILLARY     Status: Abnormal   Collection Time   01/11/12  4:47 PM      Component Value Range Comment   Glucose-Capillary 127 (*) 70 - 99 mg/dL       Component Value Date/Time   SDES SPUTUM  01/09/2012 0650   SDES SPUTUM 01/09/2012 0650   SPECREQUEST NONE 01/09/2012 0650   SPECREQUEST NONE 01/09/2012 0650   CULT NORMAL OROPHARYNGEAL FLORA 01/09/2012 0650   REPTSTATUS 01/09/2012 FINAL 01/09/2012 0650   REPTSTATUS PENDING 01/09/2012 0650   Dg Chest Port 1 View  01/10/2012  *RADIOLOGY REPORT*  Clinical Data: PICC line placement.  PORTABLE CHEST - 1 VIEW  Comparison: 01/08/2012.  Findings: 1906 hours.  Left arm PICC position is similar to the prior study, near the cavoatrial junction.  There is improved aeration of the lungs.  No confluent airspace opacity or pleural effusion is present.  Small nodular density overlapping the right lung base is stable and may reflect a bone island or granuloma.  IMPRESSION: Stable PICC line position.  No acute cardiopulmonary process.  Original Report Authenticated By: Gerrianne Scale, M.D.   Recent Results (from the past 240 hour(s))  CULTURE, BLOOD (ROUTINE X 2)     Status: Normal   Collection Time   01/08/12 11:05 PM       Component Value Range Status Comment   Specimen Description BLOOD RIGHT ANTECUBITAL   Final    Special Requests BOTTLES DRAWN AEROBIC AND ANAEROBIC 5CC   Final    Culture  Setup Time 01/09/2012 03:57   Final    Culture     Final    Value: KLEBSIELLA PNEUMONIAE     Note: Confirmed Extended Spectrum Beta-Lactamase Producer (ESBL)     Note: Gram Stain Report Called to,Read Back By and Verified With: RN B. HALE ON 01/09/12 AT 1640 BY DTERRY   Report Status 01/11/2012 FINAL   Final    Organism ID, Bacteria KLEBSIELLA PNEUMONIAE   Final   CULTURE, BLOOD (ROUTINE X 2)     Status: Normal   Collection Time   01/08/12 11:15 PM      Component Value Range Status Comment   Specimen Description BLOOD RIGHT FINGER   Final    Special Requests BOTTLES DRAWN AEROBIC ONLY [facility]   Final    Culture  Setup Time 01/09/2012 03:57   Final    Culture     Final    Value: KLEBSIELLA PNEUMONIAE     Note: SUSCEPTIBILITIES PERFORMED ON PREVIOUS CULTURE WITHIN THE LAST 5 DAYS.     Note: Gram Stain Report Called to,Read Back By and Verified With: RN B. HALE ON 01/09/12 AT 1640 BY DTERRY   Report Status 01/11/2012 FINAL   Final   MRSA PCR SCREENING     Status: Normal   Collection Time   01/09/12  2:38 AM      Component Value Range Status Comment   MRSA by PCR NEGATIVE  NEGATIVE Final   CULTURE, EXPECTORATED SPUTUM-ASSESSMENT     Status: Normal   Collection Time   01/09/12  6:50 AM      Component Value Range Status Comment   Specimen Description SPUTUM   Final    Special Requests NONE   Final    Sputum evaluation     Final    Value: THIS SPECIMEN IS ACCEPTABLE. RESPIRATORY CULTURE REPORT TO FOLLOW.   Report Status 01/09/2012 FINAL   Final   CULTURE, RESPIRATORY     Status: Normal (Preliminary result)   Collection Time   01/09/12  6:50 AM      Component Value Range Status Comment   Specimen Description SPUTUM   Final    Special Requests NONE   Final    Gram Stain PENDING   Incomplete  Culture NORMAL OROPHARYNGEAL  FLORA   Final    Report Status PENDING   Incomplete   CLOSTRIDIUM DIFFICILE BY PCR     Status: Abnormal   Collection Time   01/09/12 12:58 PM      Component Value Range Status Comment   C difficile by pcr POSITIVE (*) NEGATIVE Final       01/11/2012, 5:03 PM     LOS: 3 days

## 2012-01-11 NOTE — Progress Notes (Signed)
Received call, patient positive for ESBL in blood. Patient to be put on orange precautions as well as brown

## 2012-01-11 NOTE — Progress Notes (Signed)
[facility] PROGRESS NOTE  AHTZIRI JEFFRIES ZOX:[id number] DOB: 1932-02-24 DOA: 01/08/2012 PCP: [name], DO  Assessment/Plan: Principal Problem:  *Bacteremia Active Problems:  DIABETES MELLITUS, TYPE II  HYPERLIPIDEMIA  DEPRESSION  HYPERTENSION  Anemia  Perforated prepyloric gastric ulcer s/p antrectomy with Billroth II reconstruction  PNA (pneumonia)  Dementia  1. c-diff- had on previous admission- place on vanc taper 2.  Gram negative rod bacteremia (ESBL) on triple abx for ? PNA- will d/c levaquin d/c vanc and aztreonam, add IV primaxin, ID consulted  Other medical problems: DIABETES MELLITUS, TYPE II: Sliding scale  HYPERLIPIDEMIA: Stable medical issues  DEPRESSION: Appears to be stable medical issues  Dementia: Stable.  HYPERTENSION: Continuing antihypertensive medications  Anemia: Hemoglobin is actually improved from previous hospitalization.  Perforated prepyloric gastric ulcer s/p antrectomy with Billroth II reconstruction: History of short gut syndrome. Continue tube feedings. Hypokalemia- k dur   ID consult as patient has c-diff so abx should be limited and has gram neg bacteremia needing IV abx ?recent hospitalization with possible PICC line infection- D/C PICC line  Code Status: dnr Family Communication: called daughter Disposition Plan: back to SNF    HPI/Subjective: Patient feeling better today, head ache gone No ne    Objective: Filed Vitals:   01/10/12 1435 01/10/12 2243 01/11/12 0633 01/11/12 1400  BP: 139/72 154/72 150/59 170/82  Pulse: 79 83 92 85  Temp: 98.8 F (37.1 C) 98.9 F (37.2 C) 98.6 F (37 C) 98.6 F (37 C)  TempSrc: Oral Oral Oral Oral  Resp: 18 20 20 20   Height:      Weight:   73.347 kg (161 lb 11.2 oz)   SpO2: 100% 97% 90% 95%    Intake/Output Summary (Last 24 hours) at 01/11/12 1540 Last data filed at 01/11/12 1100  Gross per 24 hour  Intake      0 ml  Output   2500 ml  Net  -2500 ml    Exam:  General:  pleasant/cooperative, alert and oriented x1  Eyes: Sclera is nonicteric, extraocular movements appear to be intact  ENT: Normocephalic, atraumatic, mucous membranes are slightly dry  Neck: Supple, no JVD no thyromegaly  Cardiovascular: Regular rate and rhythm, S1-S2  Respiratory: Clear to auscultation bilaterally  Abdomen: Soft, nontender?, nondistended, hypoactive bowel sounds - peg tube placed Skin: No obvious skin tears or lesions or rashes  Musculoskeletal: Some overall deficiency but, but nonfocal  Psychiatric: She has chronic dementia but no signs of acute psychoses  Neurologic: No evidence of focal neurological deficit  Data Reviewed: Basic Metabolic Panel:  Lab 01/11/12 8119 01/09/12 0619 01/08/12 2305  NA 134* 129* 131*  K 3.1* 3.8 3.9  CL 102 100 98  CO2 23 19 21   GLUCOSE 106* 144* 133*  BUN 8 20 18   CREATININE 0.49* 0.72 0.63  CALCIUM 8.7 8.1* 8.8  MG -- -- --  PHOS -- -- --   Liver Function Tests:  Lab 01/08/12 2305  AST 27  ALT 22  ALKPHOS 167*  BILITOT 0.2*  PROT 7.4  ALBUMIN 3.0*   No results found for this basename: LIPASE:5,AMYLASE:5 in the last 168 hours No results found for this basename: AMMONIA:5 in the last 168 hours CBC:  Lab 01/11/12 0510 01/09/12 0619 01/08/12 2305  WBC 3.8* 6.8 3.9*  NEUTROABS -- -- 3.4  HGB 8.6* 8.4* 10.4*  HCT 26.7* 26.0* 32.3*  MCV 77.6* 78.1 79.6  PLT 266 219 235   Cardiac Enzymes: No results found for this basename: CKTOTAL:5,CKMB:5,CKMBINDEX:5,TROPONINI:5  in the last 168 hours BNP (last 3 results)  Basename 05/11/11 0520 05/07/11 1950 05/07/11 1041  PROBNP 702.4* 959.3* 1403.0*   CBG:  Lab 01/11/12 1217 01/11/12 0826 01/11/12 0422 01/11/12 0003 01/10/12 2033  GLUCAP 102* 130* 119* 112* 94    Recent Results (from the past 240 hour(s))  CULTURE, BLOOD (ROUTINE X 2)     Status: Normal   Collection Time   01/08/12 11:05 PM      Component Value Range Status Comment   Specimen Description BLOOD RIGHT  ANTECUBITAL   Final    Special Requests BOTTLES DRAWN AEROBIC AND ANAEROBIC 5CC   Final    Culture  Setup Time 01/09/2012 03:57   Final    Culture     Final    Value: KLEBSIELLA PNEUMONIAE     Note: Confirmed Extended Spectrum Beta-Lactamase Producer (ESBL)     Note: Gram Stain Report Called to,Read Back By and Verified With: RN B. HALE ON 01/09/12 AT 1640 BY DTERRY   Report Status 01/11/2012 FINAL   Final    Organism ID, Bacteria KLEBSIELLA PNEUMONIAE   Final   CULTURE, BLOOD (ROUTINE X 2)     Status: Normal   Collection Time   01/08/12 11:15 PM      Component Value Range Status Comment   Specimen Description BLOOD RIGHT FINGER   Final    Special Requests BOTTLES DRAWN AEROBIC ONLY [facility]   Final    Culture  Setup Time 01/09/2012 03:57   Final    Culture     Final    Value: KLEBSIELLA PNEUMONIAE     Note: SUSCEPTIBILITIES PERFORMED ON PREVIOUS CULTURE WITHIN THE LAST 5 DAYS.     Note: Gram Stain Report Called to,Read Back By and Verified With: RN B. HALE ON 01/09/12 AT 1640 BY DTERRY   Report Status 01/11/2012 FINAL   Final   MRSA PCR SCREENING     Status: Normal   Collection Time   01/09/12  2:38 AM      Component Value Range Status Comment   MRSA by PCR NEGATIVE  NEGATIVE Final   CULTURE, EXPECTORATED SPUTUM-ASSESSMENT     Status: Normal   Collection Time   01/09/12  6:50 AM      Component Value Range Status Comment   Specimen Description SPUTUM   Final    Special Requests NONE   Final    Sputum evaluation     Final    Value: THIS SPECIMEN IS ACCEPTABLE. RESPIRATORY CULTURE REPORT TO FOLLOW.   Report Status 01/09/2012 FINAL   Final   CULTURE, RESPIRATORY     Status: Normal (Preliminary result)   Collection Time   01/09/12  6:50 AM      Component Value Range Status Comment   Specimen Description SPUTUM   Final    Special Requests NONE   Final    Gram Stain PENDING   Incomplete    Culture     Final    Value: NORMAL OROPHARYNGEAL FLORA     Note: FINAL WHEN GS IS COMPLETE   Report  Status PENDING   Incomplete   CLOSTRIDIUM DIFFICILE BY PCR     Status: Abnormal   Collection Time   01/09/12 12:58 PM      Component Value Range Status Comment   C difficile by pcr POSITIVE (*) NEGATIVE Final      Studies: Dg Abd 1 View  12/27/2011  *RADIOLOGY REPORT*  Clinical Data: Confirmed G-tube placement.  ABDOMEN -  1 VIEW  Comparison: No priors.  Findings: A single radiograph of the abdomen was obtained after infusion of 30 ml of Omnipaque-300 into the patient's percutaneous enterogastric tube.  The injected contrast opacifies the stomach. Gas and stool is seen scattered throughout the colon extending to the level of the rectum.  No pathologic distension of small bowel is noted.  Multiple surgical clips and coils project over the upper abdomen.  IMPRESSION:  1.  Tip of the percutaneous enterogastric tube is within the lumen of the stomach.  Original Report Authenticated By: Florencia Reasons, M.D.   Ir Fluoro Guide Cv Line Left  12/25/2011  *RADIOLOGY REPORT*  Clinical Data: Chronic TPN and needs new PICC line  PICC LINE PLACEMENT WITH ULTRASOUND AND FLUOROSCOPIC  GUIDANCE  Fluoroscopy Time: 0.6 minutes.  The left arm was prepped with chlorhexidine, draped in the usual sterile fashion using maximum barrier technique (cap and mask, sterile gown, sterile gloves, large sterile sheet, hand hygiene and cutaneous antisepsis) and infiltrated locally with 1% Lidocaine.  Ultrasound demonstrated patency of the left brachial vein, and this was documented with an image.  Under real-time ultrasound guidance, this vein was accessed with a 21 gauge micropuncture needle and image documentation was performed.  The needle was exchanged over a guidewire for a peel-away sheath through which a five [place] dual lumen PICC trimmed to 43 cm was advanced, positioned with its tip at the lower SVC/right atrial junction.  Fluoroscopy during the procedure and fluoro spot radiograph confirms appropriate catheter position.   The catheter was flushed, secured to the skin with Prolene sutures, and covered with a sterile dressing.  Complications:  None  IMPRESSION: Successful left arm PICC line placement with ultrasound and fluoroscopic guidance.  The catheter is ready for use.  Original Report Authenticated By: Richarda Overlie, M.D.   Ir Replc Gastro/colonic Tube Percut W/fluoro  12/28/2011  *RADIOLOGY REPORT*  Clinical Data: Chronic tube feeds, accidental removal  GASTROSTOMY CATHETER REPLACEMENT  Comparison: 12/27/2011  Findings: Under sterile conditions, the existing percutaneous tract was cannulated with a 40 cm Kumpe catheter.  Catheter advanced easily into the stomach lumen.  This was confirmed with contrast injection.  Images obtained documentation.  Amplatz guide wire inserted.  Tract dilatation performed to advance an 18-French balloon retention gastrostomy.  Balloon tip was inflated with 10 ml saline containing 1 ml contrast.  This was retracted against into gastric wall.  Contrast injection reconfirms position.  Gastrostomy ready for use.  IMPRESSION: Fluoroscopic replacement of an 18-French balloon retention gastrostomy.  Confirmed in the stomach ready for use.  Original Report Authenticated By: Judie Petit. Ruel Favors, M.D.   Ir US Guide Vasc Access Left  12/25/2011  *RADIOLOGY REPORT*  Clinical Data: Chronic TPN and needs new PICC line  PICC LINE PLACEMENT WITH ULTRASOUND AND FLUOROSCOPIC  GUIDANCE  Fluoroscopy Time: 0.6 minutes.  The left arm was prepped with chlorhexidine, draped in the usual sterile fashion using maximum barrier technique (cap and mask, sterile gown, sterile gloves, large sterile sheet, hand hygiene and cutaneous antisepsis) and infiltrated locally with 1% Lidocaine.  Ultrasound demonstrated patency of the left brachial vein, and this was documented with an image.  Under real-time ultrasound guidance, this vein was accessed with a 21 gauge micropuncture needle and image documentation was performed.  The needle was  exchanged over a guidewire for a peel-away sheath through which a five [place] dual lumen PICC trimmed to 43 cm was advanced, positioned with its tip at the lower SVC/right atrial junction.  Fluoroscopy during the procedure and fluoro spot radiograph confirms appropriate catheter position.  The catheter was flushed, secured to the skin with Prolene sutures, and covered with a sterile dressing.  Complications:  None  IMPRESSION: Successful left arm PICC line placement with ultrasound and fluoroscopic guidance.  The catheter is ready for use.  Original Report Authenticated By: Richarda Overlie, M.D.   Dg Chest Portable 1 View  01/08/2012  *RADIOLOGY REPORT*  Clinical Data: Fever, hypoxia.  PORTABLE CHEST - 1 VIEW  Comparison: 12/20/2011  Findings: Hypoaeration results in interstitial and vascular crowding.  Mild lung base opacities, left greater than right.  No pneumothorax or pleural effusion.  Heart size upper normal to mildly enlarged.  Aortic arch atherosclerosis.  Diffuse osteopenia. No acute osseous change identified.  IMPRESSION: Hypoaeration results in interstitial and vascular crowding.  Mild lung base opacity; atelectasis versus infiltrate.  Original Report Authenticated By: Waneta Martins, M.D.   Dg Chest Port 1 View  12/20/2011  *RADIOLOGY REPORT*  Clinical Data: Fever.  History of diabetes hypertension.  PORTABLE CHEST - 1 VIEW 12/20/2011 1851 hours:  Comparison: Portable chest x-ray 11/21/2011, 10/13/2011, 05/19/2011.  Findings: Suboptimal inspiration due to body habitus which accounts for atelectasis at the bases and accentuates the cardiac silhouette.  Taking this into account, cardiac silhouette moderately enlarged but stable.  Pulmonary vascularity normal without evidence of pulmonary edema.  Lungs otherwise clear.  IMPRESSION: Suboptimal inspiration accounts for bibasilar atelectasis.  Stable cardiomegaly without pulmonary edema.  Original Report Authenticated By: Arnell Sieving, M.D.     Scheduled Meds:    . amLODipine  10 mg Per Tube Daily  . cloNIDine  0.2 mg Transdermal Weekly  . darifenacin  15 mg Oral Daily  . enoxaparin  80 mg Subcutaneous Q12H  . feeding supplement  30 mL Per Tube Daily  . fentaNYL  50 mcg Transdermal Q72H  . free water  200 mL Per Tube QID  . imipenem-cilastatin  500 mg Intravenous Q8H  . insulin aspart  0-9 Units Subcutaneous Q4H  . metoprolol  50 mg Per Tube BID  . pantoprazole  40 mg Oral Q1200  . potassium chloride  10 mEq Intravenous Q1 Hr x 3  . vancomycin  125 mg Oral QID   Followed by  . vancomycin  125 mg Oral BID   Followed by  . vancomycin  125 mg Oral Daily   Followed by  . vancomycin  125 mg Oral QODAY   Followed by  . vancomycin  125 mg Oral Q3 days  . DISCONTD: aztreonam  2 g Intravenous Q8H  . DISCONTD: vancomycin  750 mg Intravenous Q12H   Continuous Infusions:    . sodium chloride 20 mL/hr (01/10/12 0606)  . feeding supplement (JEVITY 1.2 CAL) 1,000 mL (01/10/12 1530)    Principal Problem:  *Bacteremia Active Problems:  DIABETES MELLITUS, TYPE II  HYPERLIPIDEMIA  DEPRESSION  HYPERTENSION  Anemia  Perforated prepyloric gastric ulcer s/p antrectomy with Billroth II reconstruction  PNA (pneumonia)  Dementia     VANN, JESSICA  [facility] Pager [phone]. If 8PM-8AM, please contact night-coverage at [email or web address], password [facility]

## 2012-01-11 NOTE — Progress Notes (Signed)
FL2 on chart for MD signature.  Amanda Abed Schar, LCSWA Clinical Social Work [phone]  

## 2012-01-11 NOTE — Progress Notes (Signed)
Clinical Social Work Department BRIEF PSYCHOSOCIAL ASSESSMENT 01/11/2012  Patient:  [name], [name]     Account Number:  [id number]     Admit date:  01/08/2012  Clinical Social Worker:  Doroteo Glassman  Date/Time:  01/11/2012 11:23 AM  Referred by:  Physician  Date Referred:  01/11/2012 Referred for  SNF Placement   Other Referral:   Interview type:  Patient Other interview type:   T/c with Pt's daughter, Clara Hairston    PSYCHOSOCIAL DATA Living Status:  FACILITY Admitted from facility:  [facility], [place] Level of care:   Primary support name:  Clara Primary support relationship to patient:  CHILD, ADULT Degree of support available:   Adequate    CURRENT CONCERNS Current Concerns  Post-Acute Placement   Other Concerns:    SOCIAL WORK ASSESSMENT / PLAN CSW met with Pt to discuss d/c plans.    Pt unable to remember the name of the facility but states that she's happy there and would like to return.    CSW informed Pt that the chart says that she's from [facility], to which Pt replies, "Oh, yes! That's it." Pt states that she hasn't been at [facility] for long but can't remember where she was prior.    Pt gave CSW permission to contact her daugther to discuss d/c plans.    CSW thanked Pt for her time.    Spoke with Pt's daughter, Mrs. Jenelle Mages.  Confirmed that Pt is to return to [facility] upon d/c.    Spoke with Melissa at [facility].  Per Melissa, facility able to accept Pt upon d/c, provided that a bed is available. Melissa requesting H&P.  Faxed H&P to Melissa.   Assessment/plan status:  Psychosocial Support/Ongoing Assessment of Needs Other assessment/ plan:   Information/referral to community resources:   n/a    PATIENT'S/FAMILY'S RESPONSE TO PLAN OF CARE: Pt and Pt's daughter thanked CSW for time and assistance.  CSW to continue to follow.  Providence Crosby, LCSWA Clinical Social Work [phone]

## 2012-01-12 LAB — GLUCOSE, CAPILLARY
Glucose-Capillary: 115 mg/dL — ABNORMAL HIGH (ref 70–99)
Glucose-Capillary: 110 mg/dL — ABNORMAL HIGH (ref 70–99)

## 2012-01-12 NOTE — Progress Notes (Signed)
Per MD, Pt not medically ready for d/c.  Amanda Zakariyya Helfman, LCSWA Clinical Social Work [phone]  

## 2012-01-12 NOTE — Progress Notes (Addendum)
[facility] PROGRESS NOTE  [name] WJX:[id number] DOB: 1932-04-18 DOA: 01/08/2012 PCP: [name], DO  Assessment/Plan: Principal Problem:  *Bacteremia Active Problems:  DIABETES MELLITUS, TYPE II  HYPERLIPIDEMIA  DEPRESSION  HYPERTENSION  Anemia  Perforated prepyloric gastric ulcer s/p antrectomy with Billroth II reconstruction  PNA (pneumonia)  Dementia  1. c-diff- had on previous admission- place on vanc taper 2.  Gram negative rod bacteremia (ESBL) on triple abx for ? PNA- does not appear to be PNA will d/c levaquin d/c vanc and d/c aztreonam, add IV primaxin, ID consulted: plan for 2 weeks of carbapenem therapy. Could be changed to Invanz at d/c if wishing to get back to SNF- will need PICC Line placed for d/c/midline   Other medical problems: DIABETES MELLITUS, TYPE II: Sliding scale  HYPERLIPIDEMIA: Stable medical issues  DEPRESSION: Appears to be stable medical issues  Dementia: Stable.  HYPERTENSION: Continuing antihypertensive medications  Anemia: Hemoglobin is actually improved from previous hospitalization.  Perforated prepyloric gastric ulcer s/p antrectomy with Billroth II reconstruction: History of short gut syndrome. Continue tube feedings. Hypokalemia- k dur   Code Status: dnr Family Communication: daughter called and updated Disposition Plan: back to SNF    HPI/Subjective: Patient feeling better today, head ache gone but still c/o abdominal pain     Objective: Filed Vitals:   01/11/12 0633 01/11/12 1400 01/11/12 2110 01/12/12 0605  BP: 150/59 170/82 169/83 176/98  Pulse: 92 85 87 89  Temp: 98.6 F (37 C) 98.6 F (37 C) 98.8 F (37.1 C) 98.1 F (36.7 C)  TempSrc: Oral Oral Oral Oral  Resp: 20 20 20 19   Height:      Weight: 73.347 kg (161 lb 11.2 oz)     SpO2: 90% 95% 98% 99%    Intake/Output Summary (Last 24 hours) at 01/12/12 1005 Last data filed at 01/12/12 0856  Gross per 24 hour  Intake   2630 ml  Output    550 ml    Net   2080 ml    Exam:  General: pleasant/cooperative, alert and oriented x1  Cardiovascular: Regular rate and rhythm, S1-S2  Respiratory: Clear to auscultation bilaterally  Abdomen: Soft, nontender?, nondistended, hypoactive bowel sounds - peg tube placed Skin: No obvious skin tears or lesions or rashes  Musculoskeletal: Some overall deficiency but, but nonfocal  Psychiatric: She has chronic dementia but no signs of acute psychoses  Neurologic: No evidence of focal neurological deficit  Data Reviewed: Basic Metabolic Panel:  Lab 01/11/12 2130 01/09/12 0619 01/08/12 2305  NA 134* 129* 131*  K 3.1* 3.8 3.9  CL 102 100 98  CO2 23 19 21   GLUCOSE 106* 144* 133*  BUN 8 20 18   CREATININE 0.49* 0.72 0.63  CALCIUM 8.7 8.1* 8.8  MG -- -- --  PHOS -- -- --   Liver Function Tests:  Lab 01/08/12 2305  AST 27  ALT 22  ALKPHOS 167*  BILITOT 0.2*  PROT 7.4  ALBUMIN 3.0*   No results found for this basename: LIPASE:5,AMYLASE:5 in the last 168 hours No results found for this basename: AMMONIA:5 in the last 168 hours CBC:  Lab 01/11/12 0510 01/09/12 0619 01/08/12 2305  WBC 3.8* 6.8 3.9*  NEUTROABS -- -- 3.4  HGB 8.6* 8.4* 10.4*  HCT 26.7* 26.0* 32.3*  MCV 77.6* 78.1 79.6  PLT 266 219 235   Cardiac Enzymes: No results found for this basename: CKTOTAL:5,CKMB:5,CKMBINDEX:5,TROPONINI:5 in the last 168 hours BNP (last 3 results)  Basename 05/11/11 0520 05/07/11 1950  05/07/11 1041  PROBNP 702.4* 959.3* 1403.0*   CBG:  Lab 01/12/12 0852 01/12/12 0353 01/12/12 0003 01/11/12 2005 01/11/12 1647  GLUCAP 128* 115* 109* 110* 127*    Recent Results (from the past 240 hour(s))  CULTURE, BLOOD (ROUTINE X 2)     Status: Normal   Collection Time   01/08/12 11:05 PM      Component Value Range Status Comment   Specimen Description BLOOD RIGHT ANTECUBITAL   Final    Special Requests BOTTLES DRAWN AEROBIC AND ANAEROBIC 5CC   Final    Culture  Setup Time 01/09/2012 03:57   Final     Culture     Final    Value: KLEBSIELLA PNEUMONIAE     Note: Confirmed Extended Spectrum Beta-Lactamase Producer (ESBL)     Note: Gram Stain Report Called to,Read Back By and Verified With: RN B. HALE ON 01/09/12 AT 1640 BY DTERRY   Report Status 01/11/2012 FINAL   Final    Organism ID, Bacteria KLEBSIELLA PNEUMONIAE   Final   CULTURE, BLOOD (ROUTINE X 2)     Status: Normal   Collection Time   01/08/12 11:15 PM      Component Value Range Status Comment   Specimen Description BLOOD RIGHT FINGER   Final    Special Requests BOTTLES DRAWN AEROBIC ONLY [facility]   Final    Culture  Setup Time 01/09/2012 03:57   Final    Culture     Final    Value: KLEBSIELLA PNEUMONIAE     Note: SUSCEPTIBILITIES PERFORMED ON PREVIOUS CULTURE WITHIN THE LAST 5 DAYS.     Note: Gram Stain Report Called to,Read Back By and Verified With: RN B. HALE ON 01/09/12 AT 1640 BY DTERRY   Report Status 01/11/2012 FINAL   Final   MRSA PCR SCREENING     Status: Normal   Collection Time   01/09/12  2:38 AM      Component Value Range Status Comment   MRSA by PCR NEGATIVE  NEGATIVE Final   CULTURE, EXPECTORATED SPUTUM-ASSESSMENT     Status: Normal   Collection Time   01/09/12  6:50 AM      Component Value Range Status Comment   Specimen Description SPUTUM   Final    Special Requests NONE   Final    Sputum evaluation     Final    Value: THIS SPECIMEN IS ACCEPTABLE. RESPIRATORY CULTURE REPORT TO FOLLOW.   Report Status 01/09/2012 FINAL   Final   CULTURE, RESPIRATORY     Status: Normal (Preliminary result)   Collection Time   01/09/12  6:50 AM      Component Value Range Status Comment   Specimen Description SPUTUM   Final    Special Requests NONE   Final    Gram Stain     Final    Value: FEW WBC PRESENT,BOTH PMN AND MONONUCLEAR     MODERATE SQUAMOUS EPITHELIAL CELLS PRESENT     NO ORGANISMS SEEN   Culture NORMAL OROPHARYNGEAL FLORA   Final    Report Status PENDING   Incomplete   CLOSTRIDIUM DIFFICILE BY PCR     Status: Abnormal    Collection Time   01/09/12 12:58 PM      Component Value Range Status Comment   C difficile by pcr POSITIVE (*) NEGATIVE Final      Studies: Dg Abd 1 View  12/27/2011  *RADIOLOGY REPORT*  Clinical Data: Confirmed G-tube placement.  ABDOMEN - 1 VIEW  Comparison:  No priors.  Findings: A single radiograph of the abdomen was obtained after infusion of 30 ml of Omnipaque-300 into the patient's percutaneous enterogastric tube.  The injected contrast opacifies the stomach. Gas and stool is seen scattered throughout the colon extending to the level of the rectum.  No pathologic distension of small bowel is noted.  Multiple surgical clips and coils project over the upper abdomen.  IMPRESSION:  1.  Tip of the percutaneous enterogastric tube is within the lumen of the stomach.  Original Report Authenticated By: Florencia Reasons, M.D.   Ir Fluoro Guide Cv Line Left  12/25/2011  *RADIOLOGY REPORT*  Clinical Data: Chronic TPN and needs new PICC line  PICC LINE PLACEMENT WITH ULTRASOUND AND FLUOROSCOPIC  GUIDANCE  Fluoroscopy Time: 0.6 minutes.  The left arm was prepped with chlorhexidine, draped in the usual sterile fashion using maximum barrier technique (cap and mask, sterile gown, sterile gloves, large sterile sheet, hand hygiene and cutaneous antisepsis) and infiltrated locally with 1% Lidocaine.  Ultrasound demonstrated patency of the left brachial vein, and this was documented with an image.  Under real-time ultrasound guidance, this vein was accessed with a 21 gauge micropuncture needle and image documentation was performed.  The needle was exchanged over a guidewire for a peel-away sheath through which a five [place] dual lumen PICC trimmed to 43 cm was advanced, positioned with its tip at the lower SVC/right atrial junction.  Fluoroscopy during the procedure and fluoro spot radiograph confirms appropriate catheter position.  The catheter was flushed, secured to the skin with Prolene sutures, and covered  with a sterile dressing.  Complications:  None  IMPRESSION: Successful left arm PICC line placement with ultrasound and fluoroscopic guidance.  The catheter is ready for use.  Original Report Authenticated By: Richarda Overlie, M.D.   Ir Replc Gastro/colonic Tube Percut W/fluoro  12/28/2011  *RADIOLOGY REPORT*  Clinical Data: Chronic tube feeds, accidental removal  GASTROSTOMY CATHETER REPLACEMENT  Comparison: 12/27/2011  Findings: Under sterile conditions, the existing percutaneous tract was cannulated with a 40 cm Kumpe catheter.  Catheter advanced easily into the stomach lumen.  This was confirmed with contrast injection.  Images obtained documentation.  Amplatz guide wire inserted.  Tract dilatation performed to advance an 18-French balloon retention gastrostomy.  Balloon tip was inflated with 10 ml saline containing 1 ml contrast.  This was retracted against into gastric wall.  Contrast injection reconfirms position.  Gastrostomy ready for use.  IMPRESSION: Fluoroscopic replacement of an 18-French balloon retention gastrostomy.  Confirmed in the stomach ready for use.  Original Report Authenticated By: Judie Petit. Ruel Favors, M.D.   Ir US Guide Vasc Access Left  12/25/2011  *RADIOLOGY REPORT*  Clinical Data: Chronic TPN and needs new PICC line  PICC LINE PLACEMENT WITH ULTRASOUND AND FLUOROSCOPIC  GUIDANCE  Fluoroscopy Time: 0.6 minutes.  The left arm was prepped with chlorhexidine, draped in the usual sterile fashion using maximum barrier technique (cap and mask, sterile gown, sterile gloves, large sterile sheet, hand hygiene and cutaneous antisepsis) and infiltrated locally with 1% Lidocaine.  Ultrasound demonstrated patency of the left brachial vein, and this was documented with an image.  Under real-time ultrasound guidance, this vein was accessed with a 21 gauge micropuncture needle and image documentation was performed.  The needle was exchanged over a guidewire for a peel-away sheath through which a five [place]  dual lumen PICC trimmed to 43 cm was advanced, positioned with its tip at the lower SVC/right atrial junction.  Fluoroscopy during the  procedure and fluoro spot radiograph confirms appropriate catheter position.  The catheter was flushed, secured to the skin with Prolene sutures, and covered with a sterile dressing.  Complications:  None  IMPRESSION: Successful left arm PICC line placement with ultrasound and fluoroscopic guidance.  The catheter is ready for use.  Original Report Authenticated By: Richarda Overlie, M.D.   Dg Chest Portable 1 View  01/08/2012  *RADIOLOGY REPORT*  Clinical Data: Fever, hypoxia.  PORTABLE CHEST - 1 VIEW  Comparison: 12/20/2011  Findings: Hypoaeration results in interstitial and vascular crowding.  Mild lung base opacities, left greater than right.  No pneumothorax or pleural effusion.  Heart size upper normal to mildly enlarged.  Aortic arch atherosclerosis.  Diffuse osteopenia. No acute osseous change identified.  IMPRESSION: Hypoaeration results in interstitial and vascular crowding.  Mild lung base opacity; atelectasis versus infiltrate.  Original Report Authenticated By: Waneta Martins, M.D.   Dg Chest Port 1 View  12/20/2011  *RADIOLOGY REPORT*  Clinical Data: Fever.  History of diabetes hypertension.  PORTABLE CHEST - 1 VIEW 12/20/2011 1851 hours:  Comparison: Portable chest x-ray 11/21/2011, 10/13/2011, 05/19/2011.  Findings: Suboptimal inspiration due to body habitus which accounts for atelectasis at the bases and accentuates the cardiac silhouette.  Taking this into account, cardiac silhouette moderately enlarged but stable.  Pulmonary vascularity normal without evidence of pulmonary edema.  Lungs otherwise clear.  IMPRESSION: Suboptimal inspiration accounts for bibasilar atelectasis.  Stable cardiomegaly without pulmonary edema.  Original Report Authenticated By: Arnell Sieving, M.D.    Scheduled Meds:    . amLODipine  10 mg Per Tube Daily  . cloNIDine  0.2 mg  Transdermal Weekly  . darifenacin  15 mg Oral Daily  . enoxaparin  80 mg Subcutaneous Q12H  . feeding supplement  30 mL Per Tube Daily  . fentaNYL  50 mcg Transdermal Q72H  . free water  200 mL Per Tube QID  . imipenem-cilastatin  500 mg Intravenous Q8H  . insulin aspart  0-9 Units Subcutaneous Q4H  . metoprolol  50 mg Per Tube BID  . pantoprazole  40 mg Oral Q1200  . potassium chloride  10 mEq Intravenous Q1 Hr x 3  . vancomycin  125 mg Oral QID   Followed by  . vancomycin  125 mg Oral BID   Followed by  . vancomycin  125 mg Oral Daily   Followed by  . vancomycin  125 mg Oral QODAY   Followed by  . vancomycin  125 mg Oral Q3 days  . DISCONTD: aztreonam  2 g Intravenous Q8H  . DISCONTD: vancomycin  750 mg Intravenous Q12H   Continuous Infusions:    . sodium chloride 20 mL/hr (01/10/12 0606)  . feeding supplement (JEVITY 1.2 CAL) 1,000 mL (01/11/12 1648)    Principal Problem:  *Bacteremia Active Problems:  DIABETES MELLITUS, TYPE II  HYPERLIPIDEMIA  DEPRESSION  HYPERTENSION  Anemia  Perforated prepyloric gastric ulcer s/p antrectomy with Billroth II reconstruction  PNA (pneumonia)  Dementia     Trenee Igoe  [facility] Pager [phone]. If 8PM-8AM, please contact night-coverage at [email or web address], password [facility]

## 2012-01-12 NOTE — Progress Notes (Signed)
IV Team asking if facility will be able to accommodate a mid-line cath.  Contacted Melissa at [employer].  Melissa to discuss with DON.  Awaiting call back from facility with answer.  CSW to continue to follow.  Providence Crosby, LCSWA Clinical Social Work [phone]

## 2012-01-13 ENCOUNTER — Other Ambulatory Visit: Payer: Self-pay | Admitting: Family Medicine

## 2012-01-13 LAB — BASIC METABOLIC PANEL
BUN: 7 mg/dL (ref 6–23)
Chloride: 102 mEq/L (ref 96–112)
Creatinine, Ser: 0.51 mg/dL (ref 0.50–1.10)
GFR calc Af Amer: >90 mL/min (ref >90)
GFR calc non Af Amer: 90 mL/min — ABNORMAL LOW (ref >90)
Potassium: 2.8 mEq/L — ABNORMAL LOW (ref 3.5–5.1)

## 2012-01-13 LAB — GLUCOSE, CAPILLARY
Glucose-Capillary: 106 mg/dL — ABNORMAL HIGH (ref 70–99)
Glucose-Capillary: 123 mg/dL — ABNORMAL HIGH (ref 70–99)
Glucose-Capillary: 127 mg/dL — ABNORMAL HIGH (ref 70–99)
Glucose-Capillary: 122 mg/dL — ABNORMAL HIGH (ref 70–99)
Glucose-Capillary: 129 mg/dL — ABNORMAL HIGH (ref 70–99)

## 2012-01-13 LAB — CBC
MCHC: 31.8 g/dL (ref 30.0–36.0)
MCV: 76.9 fL — ABNORMAL LOW (ref 78.0–100.0)
Platelets: 325 10*3/uL (ref 150–400)
RDW: 16.2 % — ABNORMAL HIGH (ref 11.5–15.5)
WBC: 3.6 10*3/uL — ABNORMAL LOW (ref 4.0–10.5)

## 2012-01-13 MED ORDER — SODIUM CHLORIDE 0.9 % IJ SOLN
10.0000 mL | INTRAMUSCULAR | Status: DC | PRN
  Administered 2012-01-15: 10 mL

## 2012-01-13 MED ORDER — POTASSIUM CHLORIDE 10 MEQ/100ML IV SOLN
10.0000 meq | INTRAVENOUS | Status: AC
  Administered 2012-01-13 (×3): 10 meq via INTRAVENOUS
  Filled 2012-01-13 (×3): qty 100

## 2012-01-13 NOTE — Progress Notes (Signed)
[name] is a 78 y.o. female admitted with hypoxia. She was found to have Klebsiella(ESBL) bacteremia, while already on treatment for c.diff colitis. I have seen and examined [name] at bed side, and reviewed her chart. Appreciate ID input. She denies any complaints.  1. Fever   2. Hypoxia   3. Elevated lactic acid level   4. HAP (hospital-acquired pneumonia)   5. Dementia   6. Internal gastrointestinal fistula   7. PNA (pneumonia)   8. Bacteremia   9. C. difficile colitis   10. Anemia     Past Medical History  Diagnosis Date  . Hypertension   . Reflux   . Right elbow pain     OTIF  . Dementia   . Depression   . Osteoarthritis   . Pancreatitis 11/2007    HOP  . Angiomyolipoma of kidney     right  . Ulcerative esophagitis 12/05/2007    hx elevated gastrin, severe on EGD by Dr Jena Gauss , h pylori negative  . Hiatal hernia   . S/P colonoscopy 2009    pt reports normal by Dr Lovell Sheehan  . Diabetes mellitus   . Anemia   . Hyponatremia   . Cellulitis   . Esophagitis   . Peptic ulcer   . Insomnia   . Sepsis   . Respiratory failure   . Sepsis   . Clostridium difficile infection   . Esophagitis   . Depression   . Encephalitis   . Urinary tract infection   . Bacteremia   . 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 Perforated prepyloric gastric ulcer s/p antrectomy with Billroth II reconstruction 01/30/2011   Current Facility-Administered Medications  Medication Dose Route Frequency Provider Last Rate Last Dose  . 0.9 %  sodium chloride infusion   Intravenous Continuous Hollice Espy, MD 20 mL/hr at 01/12/12 2049    . acetaminophen (TYLENOL) tablet 650 mg  650 mg Oral Q6H PRN Joseph Art, DO   650 mg at 01/13/12 1243  . amLODipine (NORVASC) tablet 10 mg  10 mg Per Tube Daily Hollice Espy, MD   10 mg at 01/13/12 0925  . cloNIDine (CATAPRES - Dosed in mg/24 hr) patch 0.2 mg  0.2 mg Transdermal Weekly Hollice Espy, MD   0.2 mg at  01/11/12 0911  . darifenacin (ENABLEX) 24 hr tablet 15 mg  15 mg Oral Daily Hollice Espy, MD   15 mg at 01/13/12 0925  . enoxaparin (LOVENOX) injection 80 mg  80 mg Subcutaneous Q12H Hollice Espy, MD   80 mg at 01/13/12 0924  . feeding supplement (JEVITY 1.2 CAL) liquid 1,000 mL  1,000 mL Per Tube Continuous Heather Cornelison Pitts, RD 45 mL/hr at 01/11/12 1648 1,000 mL at 01/11/12 1648  . feeding supplement (PRO-STAT SUGAR FREE 64) liquid 30 mL  30 mL Per Tube Daily Heather Cornelison Pitts, RD   30 mL at 01/13/12 0925  . fentaNYL (DURAGESIC - dosed mcg/hr) 50 mcg  50 mcg Transdermal Q72H Hollice Espy, MD   50 mcg at 01/11/12 0913  . free water 200 mL  200 mL Per Tube QID Heather Cornelison Pitts, RD   200 mL at 01/13/12 1200  . hydrALAZINE (APRESOLINE) injection 10 mg  10 mg Intravenous Q6H PRN Joseph Art, DO      . imipenem-cilastatin (PRIMAXIN) 500 mg in sodium chloride 0.9 % 100 mL IVPB  500 mg Intravenous Q8H Loletta Specter,  RPH   500 mg at 01/13/12 1542  . insulin aspart (novoLOG) injection 0-9 Units  0-9 Units Subcutaneous Q4H Hollice Espy, MD   1 Units at 01/13/12 1243  . metoprolol tartrate (LOPRESSOR) tablet 50 mg  50 mg Per Tube BID Hollice Espy, MD   50 mg at 01/13/12 0925  . oxyCODONE (Oxy IR/ROXICODONE) immediate release tablet 5 mg  5 mg Per Tube Q4H PRN Hollice Espy, MD   5 mg at 01/13/12 0529  . pantoprazole (PROTONIX) EC tablet 40 mg  40 mg Oral Q1200 Hollice Espy, MD   40 mg at 01/13/12 1242  . potassium chloride 10 mEq in 100 mL IVPB  10 mEq Intravenous Q1 Hr x 3 Roma Kayser Schorr, NP   10 mEq at 01/13/12 0925  . sodium chloride 0.9 % injection 10-40 mL  10-40 mL Intracatheter PRN Ginnie Smart, MD      . traZODone (DESYREL) tablet 50 mg  50 mg Per Tube QHS PRN Hollice Espy, MD   50 mg at 01/09/12 2329  . vancomycin (VANCOCIN) 50 mg/mL oral solution 125 mg  125 mg Oral QID Joseph Art, DO   125 mg at 01/13/12 1541    Followed by  . vancomycin (VANCOCIN) 50 mg/mL oral solution 125 mg  125 mg Oral BID Joseph Art, DO       Followed by  . vancomycin (VANCOCIN) 50 mg/mL oral solution 125 mg  125 mg Oral Daily Joseph Art, DO       Followed by  . vancomycin (VANCOCIN) 50 mg/mL oral solution 125 mg  125 mg Oral QODAY Joseph Art, DO       Followed by  . vancomycin (VANCOCIN) 50 mg/mL oral solution 125 mg  125 mg Oral Q3 days Joseph Art, DO       Allergies  Allergen Reactions  . Other Swelling    Pinto beans cause swelling and hives  . Penicillins Swelling   Principal Problem:  *Bacteremia Active Problems:  DIABETES MELLITUS, TYPE II  HYPERLIPIDEMIA  DEPRESSION  HYPERTENSION  Anemia  Perforated prepyloric gastric ulcer s/p antrectomy with Billroth II reconstruction  PNA (pneumonia)  Dementia   Vital signs in last 24 hours: Temp:  [98.3 F (36.8 C)-98.4 F (36.9 C)] 98.3 F (36.8 C) (07/17 1341) Pulse Rate:  [71-80] 71  (07/17 1341) Resp:  [17-18] 18  (07/17 1341) BP: (125-161)/(58-93) 151/93 mmHg (07/17 1341) SpO2:  [95 %-96 %] 96 % (07/17 1341) Weight:  [74.844 kg (165 lb)] 74.844 kg (165 lb) (07/17 0500) Weight change:  Last BM Date: 01/11/12  Intake/Output from previous day: 07/16 0701 - 07/17 0700 In: 4343 [I.V.:1298] Out: -  Intake/Output this shift: Total I/O In: 60 [Other:60] Out: -   Lab Results:  Basename 01/13/12 0500 01/11/12 0510  WBC 3.6* 3.8*  HGB 9.1* 8.6*  HCT 28.6* 26.7*  PLT 325 266   BMET  Basename 01/13/12 0500 01/11/12 0510  NA 137 134*  K 2.8* 3.1*  CL 102 102  CO2 25 23  GLUCOSE 123* 106*  BUN 7 8  CREATININE 0.51 0.49*  CALCIUM 8.5 8.7    Studies/Results: No results found.  Medications: I have reviewed the patient's current medications.   Physical exam GENERAL- alert HEAD- normal atraumatic, no neck masses, normal thyroid, no jvd RESPIRATORY- appears well, vitals normal, no respiratory distress, acyanotic, normal RR, ear  and throat exam is normal, neck free of mass  or lymphadenopathy, chest clear, no wheezing, crepitations, rhonchi, normal symmetric air entry CVS- regular rate and rhythm, S1, S2 normal, no murmur, click, rub or gallop ABDOMEN- peg in place. Belly soft. NEURO- Grossly normal EXTREMITIES- extremities normal, atraumatic, no cyanosis or edema  Plan   * Klebsiella(ESBL) Bacteremia- ?source. Afebrile. Appreciate ID. Now has picc. To continue carbapenem for total of 2 weeks(d/c on invanz)  * c.diff colitis- to complete vanc taper as planned. Active Problems:  DIABETES MELLITUS, TYPE II  HYPERLIPIDEMIA  DEPRESSION  HYPERTENSION  Anemia  Perforated prepyloric gastric ulcer s/p antrectomy with Billroth II reconstruction  PNA (pneumonia)- on imipenem.  Dementia Disposition- likely d/c to snf in am.   Anhad Sheeley 01/13/2012 3:45 PM Pager: [phone].

## 2012-01-13 NOTE — Progress Notes (Signed)
INFECTIOUS DISEASE PROGRESS NOTE  ID: [name] is a 78 y.o. female with   Principal Problem:  *Bacteremia Active Problems:  DIABETES MELLITUS, TYPE II  HYPERLIPIDEMIA  DEPRESSION  HYPERTENSION  Anemia  Perforated prepyloric gastric ulcer s/p antrectomy with Billroth II reconstruction  PNA (pneumonia)  Dementia  Subjective: States she is having normal BM.   Abtx:  Anti-infectives     Start     Dose/Rate Route Frequency Ordered Stop   02/08/12 1000   vancomycin (VANCOCIN) 50 mg/mL oral solution 125 mg        125 mg Oral Every 3 DAYS 01/09/12 1826 02/23/12 0959   01/31/12 1000   vancomycin (VANCOCIN) 50 mg/mL oral solution 125 mg        125 mg Oral Every other day 01/09/12 1826 02/08/12 0959   01/24/12 1000   vancomycin (VANCOCIN) 50 mg/mL oral solution 125 mg        125 mg Oral Daily 01/09/12 1826 01/31/12 0959   01/16/12 2200   vancomycin (VANCOCIN) 50 mg/mL oral solution 125 mg        125 mg Oral 2 times daily 01/09/12 1826 01/23/12 2159   01/11/12 1600   imipenem-cilastatin (PRIMAXIN) 500 mg in sodium chloride 0.9 % 100 mL IVPB        500 mg 200 mL/hr over 30 Minutes Intravenous Every 8 hours 01/11/12 1533     01/10/12 1800   levofloxacin (LEVAQUIN) IVPB 750 mg  Status:  Discontinued        750 mg 100 mL/hr over 90 Minutes Intravenous Every 24 hours 01/10/12 0907 01/10/12 1207   01/10/12 1400   aztreonam (AZACTAM) 2 g in dextrose 5 % 50 mL IVPB  Status:  Discontinued        2 g 100 mL/hr over 30 Minutes Intravenous 3 times per day 01/10/12 0907 01/11/12 1505   01/09/12 1830   vancomycin (VANCOCIN) 50 mg/mL oral solution 125 mg        125 mg Oral 4 times daily 01/09/12 1826 01/16/12 1759   01/09/12 1800   levofloxacin (LEVAQUIN) IVPB 750 mg  Status:  Discontinued        750 mg 100 mL/hr over 90 Minutes Intravenous Every 24 hours 01/09/12 0310 01/09/12 0311   01/09/12 1800   levofloxacin (LEVAQUIN) IVPB 750 mg  Status:  Discontinued        750 mg 100 mL/hr  over 90 Minutes Intravenous Every 24 hours 01/09/12 0311 01/10/12 0907   01/09/12 1800   vancomycin (VANCOCIN) 750 mg in sodium chloride 0.9 % 150 mL IVPB  Status:  Discontinued        750 mg 150 mL/hr over 60 Minutes Intravenous Every 12 hours 01/09/12 0348 01/11/12 1505   01/09/12 0600   aztreonam (AZACTAM) 2 g in dextrose 5 % 50 mL IVPB  Status:  Discontinued        2 g 100 mL/hr over 30 Minutes Intravenous 3 times per day 01/09/12 0216 01/10/12 0907   01/09/12 0230   levofloxacin (LEVAQUIN) IVPB 750 mg  Status:  Discontinued        750 mg 100 mL/hr over 90 Minutes Intravenous Every 24 hours 01/09/12 0216 01/09/12 0311   01/09/12 0015   aztreonam (AZACTAM) 1 g in dextrose 5 % 50 mL IVPB        1 g 100 mL/hr over 30 Minutes Intravenous  Once 01/09/12 0002 01/09/12 0225   01/09/12 0000   vancomycin (VANCOCIN) IVPB  1000 mg/200 mL premix        1,000 mg 200 mL/hr over 60 Minutes Intravenous  Once 01/08/12 2350 01/09/12 0421   01/09/12 0000   levofloxacin (LEVAQUIN) IVPB 500 mg  Status:  Discontinued        500 mg 100 mL/hr over 60 Minutes Intravenous Every 24 hours 01/08/12 2350 01/09/12 0219   01/09/12 0000   aztreonam (AZACTAM) injection 1 g  Status:  Discontinued        1 g Intramuscular  Once 01/08/12 2358 01/09/12 0001          Medications:  Scheduled:   . amLODipine  10 mg Per Tube Daily  . cloNIDine  0.2 mg Transdermal Weekly  . darifenacin  15 mg Oral Daily  . enoxaparin  80 mg Subcutaneous Q12H  . feeding supplement  30 mL Per Tube Daily  . fentaNYL  50 mcg Transdermal Q72H  . free water  200 mL Per Tube QID  . imipenem-cilastatin  500 mg Intravenous Q8H  . insulin aspart  0-9 Units Subcutaneous Q4H  . metoprolol  50 mg Per Tube BID  . pantoprazole  40 mg Oral Q1200  . potassium chloride  10 mEq Intravenous Q1 Hr x 3  . vancomycin  125 mg Oral QID   Followed by  . vancomycin  125 mg Oral BID   Followed by  . vancomycin  125 mg Oral Daily   Followed by  .  vancomycin  125 mg Oral QODAY   Followed by  . vancomycin  125 mg Oral Q3 days    Objective: Vital signs in last 24 hours: Temp:  [98.3 F (36.8 C)-98.4 F (36.9 C)] 98.3 F (36.8 C) (07/17 1341) Pulse Rate:  [71-80] 71  (07/17 1341) Resp:  [17-18] 18  (07/17 1341) BP: (125-161)/(58-93) 151/93 mmHg (07/17 1341) SpO2:  [95 %-96 %] 96 % (07/17 1341) Weight:  [74.844 kg (165 lb)] 74.844 kg (165 lb) (07/17 0500)   General appearance: alert and cooperative Resp: clear to auscultation bilaterally Cardio: regular rate and rhythm GI: normal findings: bowel sounds normal and soft, non-tender  Lab Results  Basename 01/13/12 0500 01/11/12 0510  WBC 3.6* 3.8*  HGB 9.1* 8.6*  HCT 28.6* 26.7*  NA 137 134*  K 2.8* 3.1*  CL 102 102  CO2 25 23  BUN 7 8  CREATININE 0.51 0.49*  GLU -- --   Liver Panel No results found for this basename: PROT:2,ALBUMIN:2,AST:2,ALT:2,ALKPHOS:2,BILITOT:2,BILIDIR:2,IBILI:2 in the last 72 hours Sedimentation Rate No results found for this basename: ESRSEDRATE in the last 72 hours C-Reactive Protein No results found for this basename: CRP:2 in the last 72 hours  Microbiology: Recent Results (from the past 240 hour(s))  CULTURE, BLOOD (ROUTINE X 2)     Status: Normal   Collection Time   01/08/12 11:05 PM      Component Value Range Status Comment   Specimen Description BLOOD RIGHT ANTECUBITAL   Final    Special Requests BOTTLES DRAWN AEROBIC AND ANAEROBIC 5CC   Final    Culture  Setup Time 01/09/2012 03:57   Final    Culture     Final    Value: KLEBSIELLA PNEUMONIAE     Note: Confirmed Extended Spectrum Beta-Lactamase Producer (ESBL)     Note: Gram Stain Report Called to,Read Back By and Verified With: RN B. HALE ON 01/09/12 AT 1640 BY DTERRY   Report Status 01/11/2012 FINAL   Final    Organism ID, Bacteria KLEBSIELLA PNEUMONIAE  Final   CULTURE, BLOOD (ROUTINE X 2)     Status: Normal   Collection Time   01/08/12 11:15 PM      Component Value Range  Status Comment   Specimen Description BLOOD RIGHT FINGER   Final    Special Requests BOTTLES DRAWN AEROBIC ONLY [facility]   Final    Culture  Setup Time 01/09/2012 03:57   Final    Culture     Final    Value: KLEBSIELLA PNEUMONIAE     Note: SUSCEPTIBILITIES PERFORMED ON PREVIOUS CULTURE WITHIN THE LAST 5 DAYS.     Note: Gram Stain Report Called to,Read Back By and Verified With: RN B. HALE ON 01/09/12 AT 1640 BY DTERRY   Report Status 01/11/2012 FINAL   Final   MRSA PCR SCREENING     Status: Normal   Collection Time   01/09/12  2:38 AM      Component Value Range Status Comment   MRSA by PCR NEGATIVE  NEGATIVE Final   CULTURE, EXPECTORATED SPUTUM-ASSESSMENT     Status: Normal   Collection Time   01/09/12  6:50 AM      Component Value Range Status Comment   Specimen Description SPUTUM   Final    Special Requests NONE   Final    Sputum evaluation     Final    Value: THIS SPECIMEN IS ACCEPTABLE. RESPIRATORY CULTURE REPORT TO FOLLOW.   Report Status 01/09/2012 FINAL   Final   CULTURE, RESPIRATORY     Status: Normal (Preliminary result)   Collection Time   01/09/12  6:50 AM      Component Value Range Status Comment   Specimen Description SPUTUM   Final    Special Requests NONE   Final    Gram Stain     Final    Value: FEW WBC PRESENT,BOTH PMN AND MONONUCLEAR     MODERATE SQUAMOUS EPITHELIAL CELLS PRESENT     NO ORGANISMS SEEN   Culture NORMAL OROPHARYNGEAL FLORA   Final    Report Status PENDING   Incomplete   CLOSTRIDIUM DIFFICILE BY PCR     Status: Abnormal   Collection Time   01/09/12 12:58 PM      Component Value Range Status Comment   C difficile by pcr POSITIVE (*) NEGATIVE Final     Studies/Results: No results found.   Assessment/Plan: Kleb pneumo bacteremia (ESBL) C diff Continue vanco taper for 6 weeks Complete 2 weeks of rx with carbapenem (today is day 2) Please call if questions   [name] Infectious Diseases [phone] 01/13/2012, 6:53 PM   LOS: 5 days

## 2012-01-13 NOTE — Progress Notes (Signed)
Per Melissa at [facility], facility can accommodate midline.  Notified IV team.  IV team to meet with Pt later today.  Providence Crosby, LCSWA Clinical Social Work [phone]

## 2012-01-13 NOTE — Progress Notes (Signed)
Peripherally Inserted Central Catheter/Midline Placement  The IV Nurse has discussed with the patient and/or persons authorized to consent for the patient, the purpose of this procedure and the potential benefits and risks involved with this procedure.  The benefits include less needle sticks, lab draws from the catheter and patient may be discharged home with the catheter.  Risks include, but not limited to, infection, bleeding, blood clot (thrombus formation), and puncture of an artery; nerve damage and irregular heat beat.  Alternatives to this procedure were also discussed.  PICC/Midline Placement Documentation     Telephone consent from daughter, Lissa Merlin 01/13/2012, 3:35 PM

## 2012-01-14 LAB — GLUCOSE, CAPILLARY
Glucose-Capillary: 135 mg/dL — ABNORMAL HIGH (ref 70–99)
Glucose-Capillary: 116 mg/dL — ABNORMAL HIGH (ref 70–99)

## 2012-01-14 LAB — CULTURE, RESPIRATORY W GRAM STAIN

## 2012-01-14 MED ORDER — VANCOMYCIN 50 MG/ML ORAL SOLUTION: 125.0000 mg | Freq: Every day | ORAL | Status: AC

## 2012-01-14 MED ORDER — SODIUM CHLORIDE 0.9 % IV SOLN: 1.0000 g | INTRAVENOUS | Status: AC

## 2012-01-14 MED ORDER — OXYCODONE HCL 5 MG PO TABS
5.0000 mg | ORAL_TABLET | Freq: Once | ORAL | Status: AC
  Administered 2012-01-14: 5 mg via ORAL

## 2012-01-14 MED ORDER — VITAMINS A & D EX OINT
TOPICAL_OINTMENT | CUTANEOUS | Status: AC
  Administered 2012-01-14: 21:00:00
  Filled 2012-01-14: qty 5

## 2012-01-14 MED ORDER — SODIUM CHLORIDE 0.9 % IV SOLN
1.0000 g | INTRAVENOUS | Status: DC
  Administered 2012-01-14: 1 g via INTRAVENOUS
  Filled 2012-01-14 (×2): qty 1

## 2012-01-14 MED ORDER — VANCOMYCIN 50 MG/ML ORAL SOLUTION: 125.0000 mg | ORAL | Status: DC

## 2012-01-14 MED ORDER — VANCOMYCIN 50 MG/ML ORAL SOLUTION: 125.0000 mg | Freq: Two times a day (BID) | ORAL | Status: AC

## 2012-01-14 NOTE — Progress Notes (Signed)
ANTIBIOTIC CONSULT NOTE  Pharmacy Consult for Primaxin Indication: blood culture--ESBL  Allergies  Allergen Reactions  . Other Swelling    Pinto beans cause swelling and hives  . Penicillins Swelling    Patient Measurements: Height: 5' 6.5" (168.9 cm) Weight: 163 lb 1.6 oz (73.982 kg) IBW/kg (Calculated) : 60.45   Vital Signs: Temp: 98.3 F (36.8 C) (07/18 0525) Temp src: Oral (07/18 0525) BP: 144/78 mmHg (07/18 0525) Pulse Rate: 75  (07/18 0525) Intake/Output from previous day: 07/17 0701 - 07/18 0700 In: 2080 [I.V.:240; IV Piggyback:100] Out: -  Intake/Output from this shift:    Labs:  Basename 01/13/12 0500  WBC 3.6*  HGB 9.1*  PLT 325  LABCREA --  CREATININE 0.51   Estimated Creatinine Clearance: 60.3 ml/min (by C-G formula based on Cr of 0.51).  65 ml/mim/1.79m2 (normalized)  Microbiology: Recent Results (from the past 720 hour(s))  URINE CULTURE     Status: Normal   Collection Time   12/20/11  7:03 PM      Component Value Range Status Comment   Specimen Description URINE, RANDOM   Final    Special Requests NONE   Final    Culture  Setup Time [id number]   Final    Colony Count NO GROWTH   Final    Culture NO GROWTH   Final    Report Status 12/22/2011 FINAL   Final   CATH TIP CULTURE     Status: Normal   Collection Time   12/21/11  3:43 AM      Component Value Range Status Comment   Specimen Description CATH TIP   Final    Special Requests NONE   Final    Culture     Final    Value: 30 COLONIES STAPHYLOCOCCUS SPECIES (COAGULASE NEGATIVE)     Note: RIFAMPIN AND GENTAMICIN SHOULD NOT BE USED AS SINGLE DRUGS FOR TREATMENT OF STAPH INFECTIONS.   Report Status 12/25/2011 FINAL   Final    Organism ID, Bacteria STAPHYLOCOCCUS SPECIES (COAGULASE NEGATIVE)   Final   CLOSTRIDIUM DIFFICILE BY PCR     Status: Abnormal   Collection Time   12/22/11  7:18 PM      Component Value Range Status Comment   C difficile by pcr POSITIVE (*) NEGATIVE Final   MRSA PCR  SCREENING     Status: Normal   Collection Time   12/23/11  2:55 AM      Component Value Range Status Comment   MRSA by PCR NEGATIVE  NEGATIVE Final   CULTURE, BLOOD (ROUTINE X 2)     Status: Normal   Collection Time   01/08/12 11:05 PM      Component Value Range Status Comment   Specimen Description BLOOD RIGHT ANTECUBITAL   Final    Special Requests BOTTLES DRAWN AEROBIC AND ANAEROBIC 5CC   Final    Culture  Setup Time 01/09/2012 03:57   Final    Culture     Final    Value: KLEBSIELLA PNEUMONIAE     Note: Confirmed Extended Spectrum Beta-Lactamase Producer (ESBL)     Note: Gram Stain Report Called to,Read Back By and Verified With: RN B. HALE ON 01/09/12 AT 1640 BY DTERRY   Report Status 01/11/2012 FINAL   Final    Organism ID, Bacteria KLEBSIELLA PNEUMONIAE   Final   CULTURE, BLOOD (ROUTINE X 2)     Status: Normal   Collection Time   01/08/12 11:15 PM      Component Value  Range Status Comment   Specimen Description BLOOD RIGHT FINGER   Final    Special Requests BOTTLES DRAWN AEROBIC ONLY [facility]   Final    Culture  Setup Time 01/09/2012 03:57   Final    Culture     Final    Value: KLEBSIELLA PNEUMONIAE     Note: SUSCEPTIBILITIES PERFORMED ON PREVIOUS CULTURE WITHIN THE LAST 5 DAYS.     Note: Gram Stain Report Called to,Read Back By and Verified With: RN B. HALE ON 01/09/12 AT 1640 BY DTERRY   Report Status 01/11/2012 FINAL   Final   MRSA PCR SCREENING     Status: Normal   Collection Time   01/09/12  2:38 AM      Component Value Range Status Comment   MRSA by PCR NEGATIVE  NEGATIVE Final   CULTURE, EXPECTORATED SPUTUM-ASSESSMENT     Status: Normal   Collection Time   01/09/12  6:50 AM      Component Value Range Status Comment   Specimen Description SPUTUM   Final    Special Requests NONE   Final    Sputum evaluation     Final    Value: THIS SPECIMEN IS ACCEPTABLE. RESPIRATORY CULTURE REPORT TO FOLLOW.   Report Status 01/09/2012 FINAL   Final   CULTURE, RESPIRATORY     Status: Normal  (Preliminary result)   Collection Time   01/09/12  6:50 AM      Component Value Range Status Comment   Specimen Description SPUTUM   Final    Special Requests NONE   Final    Gram Stain     Final    Value: FEW WBC PRESENT,BOTH PMN AND MONONUCLEAR     MODERATE SQUAMOUS EPITHELIAL CELLS PRESENT     NO ORGANISMS SEEN   Culture NORMAL OROPHARYNGEAL FLORA   Final    Report Status PENDING   Incomplete   CLOSTRIDIUM DIFFICILE BY PCR     Status: Abnormal   Collection Time   01/09/12 12:58 PM      Component Value Range Status Comment   C difficile by pcr POSITIVE (*) NEGATIVE Final     Medications:  Scheduled:     . amLODipine  10 mg Per Tube Daily  . cloNIDine  0.2 mg Transdermal Weekly  . darifenacin  15 mg Oral Daily  . enoxaparin  80 mg Subcutaneous Q12H  . feeding supplement  30 mL Per Tube Daily  . fentaNYL  50 mcg Transdermal Q72H  . free water  200 mL Per Tube QID  . imipenem-cilastatin  500 mg Intravenous Q8H  . insulin aspart  0-9 Units Subcutaneous Q4H  . metoprolol  50 mg Per Tube BID  . oxyCODONE  5 mg Oral Once  . pantoprazole  40 mg Oral Q1200  . vancomycin  125 mg Oral QID   Followed by  . vancomycin  125 mg Oral BID   Followed by  . vancomycin  125 mg Oral Daily   Followed by  . vancomycin  125 mg Oral QODAY   Followed by  . vancomycin  125 mg Oral Q3 days   Infusions:     . sodium chloride 20 mL/hr at 01/12/12 2049  . feeding supplement (JEVITY 1.2 CAL) 1,000 mL (01/13/12 2227)   Assessment:  78 YO [name] now with infection (bl culture growing ESBL  Producing isolate of Klebsiella pneumoniae )   Primaxin recommended by infectious disease, started 7/15 pm.  Plan is to complete 2 weeks  carbapenem therapy  Afeb, WBC wnl, renal function stable  Goal of Therapy:  Eradication of infection Appropriate dosing for renal function  Plan:   Continue Primaxin 500mg  IV q 8 hours Follow up renal function & cultures   Loralee Pacas, PharmD, BCPS Pager:  [phone] 01/14/2012,11:05 AM

## 2012-01-14 NOTE — Discharge Summary (Addendum)
DISCHARGE SUMMARY  [name]  MR#: [id number]  DOB:28-Sep-1931  Date of Admission: 01/08/2012 Date of Discharge: 01/15/2012  Attending Physician:Moises Terpstra  Patient's WUJ:WJXB, TIFFANY, DO  Consults:Treatment Team:  Ginnie Smart, MD  Discharge Diagnoses: Present on Admission:  .DIABETES MELLITUS, TYPE II .Perforated prepyloric gastric ulcer s/p antrectomy with Billroth II reconstruction .HYPERLIPIDEMIA .DEPRESSION .HYPERTENSION .Anemia .PNA (pneumonia) .Dementia .Bacteremia  Hospital Course: [name] was admitted on 01/09/12 with hypoxia. She was found to have klebsiella(ESBL) bacteremia, and possible pna. Dr Ninetta Lights saw her and recommended 2 weeks of carbapenem, and for her to continue  Oral vancomycin taper for c.diff. She had picc line placed in left upper arm, and should continue invanz for 12 more days. She denies any complaints today.  Medication List  As of 01/15/2012  9:08 AM   STOP taking these medications         fat emulsion 20 % infusion      feeding supplement (JEVITY 1.5 CAL) Liqd      metroNIDAZOLE 50 mg/ml oral suspension      oxyCODONE 5 MG immediate release tablet      tpn solution (CLINIMIX E 5/15) 5 % Soln         TAKE these medications         acetaminophen 500 MG tablet   Commonly known as: TYLENOL   500 mg by Feeding Tube route daily as needed. For pain      amLODipine 10 MG tablet   Commonly known as: NORVASC   10 mg by Feeding Tube route daily.      butalbital-acetaminophen-caffeine 50-325-40 MG per tablet   Commonly known as: FIORICET, ESGIC   2 tablets by Feeding Tube route every 8 (eight) hours as needed. For headache      cloNIDine 0.2 mg/24hr patch   Commonly known as: CATAPRES - Dosed in mg/24 hr   Place 1 patch onto the skin once a week. On monday      darifenacin 15 MG 24 hr tablet   Commonly known as: ENABLEX   15 mg by Feeding Tube route daily.      enoxaparin 80 MG/0.8ML injection   Commonly known as: LOVENOX    Inject 0.8 mLs (80 mg total) into the skin every 12 (twelve) hours.      fentaNYL 50 MCG/HR   Commonly known as: DURAGESIC - dosed mcg/hr   Place 1 patch onto the skin every 3 (three) days.      Glucerna 1.5 Cal Liqd   Take 1,000 mLs by mouth continuous. Rate:61ml/hr  (rate increased on tube feed on 01/01/12)      insulin aspart 100 UNIT/ML injection   Commonly known as: novoLOG   Inject 5 Units into the skin 3 (three) times daily as needed. For cbg >150      metoprolol 50 MG tablet   Commonly known as: LOPRESSOR   50 mg by Feeding Tube route 2 (two) times daily.      ondansetron 4 MG/5ML solution   Commonly known as: ZOFRAN   Take 4 mg by mouth every 8 (eight) hours as needed. For nausea      pantoprazole 40 MG tablet   Commonly known as: PROTONIX   40 mg by Feeding Tube route daily.      sodium chloride 0.9 % SOLN 50 mL with ertapenem 1 G SOLR 1 g   Inject 1 g into the vein daily.      traZODone 50 MG tablet  Commonly known as: DESYREL   Place 50 mg into feeding tube at bedtime as needed. For sleep      vancomycin 50 mg/mL oral solution   Commonly known as: VANCOCIN   Take 2.5 mLs (125 mg total) by mouth 2 (two) times daily.      vancomycin 50 mg/mL oral solution   Commonly known as: VANCOCIN   Take 2.5 mLs (125 mg total) by mouth daily.   Start taking on: 01/22/2012      vancomycin 50 mg/mL oral solution   Commonly known as: VANCOCIN   Take 2.5 mLs (125 mg total) by mouth every other day.   Start taking on: 01/30/2012      vancomycin 50 mg/mL oral solution   Commonly known as: VANCOCIN   Take 2.5 mLs (125 mg total) by mouth every 3 (three) days.   Start taking on: 02/07/2012      Vitamin D-3 5000 UNITS Tabs   Take 1 tablet by mouth daily.             Day of Discharge BP 120/59  Pulse 80  Temp 98.4 F (36.9 C) (Oral)  Resp 18  Ht 5' 6.5" (1.689 m)  Wt 76.658 kg (169 lb)  BMI 26.87 kg/m2  SpO2 95%  Physical Exam: At baseline.  Results for  orders placed during the hospital encounter of 01/08/12 (from the past 24 hour(s))  GLUCOSE, CAPILLARY     Status: Abnormal   Collection Time   01/14/12 11:58 AM      Component Value Range   Glucose-Capillary 135 (*) 70 - 99 mg/dL   Comment 1 Notify RN    GLUCOSE, CAPILLARY     Status: Abnormal   Collection Time   01/14/12  4:10 PM      Component Value Range   Glucose-Capillary 116 (*) 70 - 99 mg/dL  GLUCOSE, CAPILLARY     Status: Abnormal   Collection Time   01/14/12  8:00 PM      Component Value Range   Glucose-Capillary 102 (*) 70 - 99 mg/dL   Comment 1 Notify RN    GLUCOSE, CAPILLARY     Status: Normal   Collection Time   01/14/12 11:44 PM      Component Value Range   Glucose-Capillary 90  70 - 99 mg/dL   Comment 1 Notify RN    GLUCOSE, CAPILLARY     Status: Abnormal   Collection Time   01/15/12  4:07 AM      Component Value Range   Glucose-Capillary 104 (*) 70 - 99 mg/dL   Comment 1 Notify RN      Disposition: snf   Follow-up Appts: Discharge Orders    Future Orders Please Complete By Expires   Diet - low sodium heart healthy      Increase activity slowly           Tests Needing Follow-up: Cbc/bmp in 1 week.  Time spent in discharge (includes decision making & examination of pt): 35 minutes  Signed: Chavie Kolinski 01/15/2012, 9:08 AM

## 2012-01-15 LAB — GLUCOSE, CAPILLARY: Glucose-Capillary: 90 mg/dL (ref 70–99)

## 2012-01-15 MED ORDER — HEPARIN SOD (PORK) LOCK FLUSH 100 UNIT/ML IV SOLN
250.0000 [IU] | Freq: Every day | INTRAVENOUS | Status: DC
  Filled 2012-01-15: qty 3

## 2012-01-15 MED ORDER — HEPARIN SOD (PORK) LOCK FLUSH 100 UNIT/ML IV SOLN
250.0000 [IU] | INTRAVENOUS | Status: DC | PRN
  Administered 2012-01-15: 250 [IU]
  Filled 2012-01-15: qty 3

## 2012-02-01 ENCOUNTER — Encounter (HOSPITAL_COMMUNITY): Payer: Self-pay | Admitting: Emergency Medicine

## 2012-02-01 ENCOUNTER — Emergency Department (HOSPITAL_COMMUNITY)
Admission: EM | Admit: 2012-02-01 | Discharge: 2012-02-01 | Disposition: A | Discharge status: Home / Self Care | Payer: Medicare HMO | Attending: Emergency Medicine | Admitting: Emergency Medicine

## 2012-02-01 ENCOUNTER — Emergency Department (HOSPITAL_COMMUNITY): Payer: Medicare HMO

## 2012-02-01 DIAGNOSIS — I10 Essential (primary) hypertension: Secondary | ICD-10-CM | POA: Insufficient documentation

## 2012-02-01 DIAGNOSIS — S0100XA Unspecified open wound of scalp, initial encounter: Secondary | ICD-10-CM | POA: Insufficient documentation

## 2012-02-01 DIAGNOSIS — Z794 Long term (current) use of insulin: Secondary | ICD-10-CM | POA: Insufficient documentation

## 2012-02-01 DIAGNOSIS — IMO0002 Reserved for concepts with insufficient information to code with codable children: Secondary | ICD-10-CM

## 2012-02-01 DIAGNOSIS — Y9210 Unspecified residential institution as the place of occurrence of the external cause: Secondary | ICD-10-CM | POA: Insufficient documentation

## 2012-02-01 DIAGNOSIS — E119 Type 2 diabetes mellitus without complications: Secondary | ICD-10-CM | POA: Insufficient documentation

## 2012-02-01 DIAGNOSIS — S0990XA Unspecified injury of head, initial encounter: Secondary | ICD-10-CM | POA: Insufficient documentation

## 2012-02-01 DIAGNOSIS — M5030 Other cervical disc degeneration, unspecified cervical region: Secondary | ICD-10-CM | POA: Insufficient documentation

## 2012-02-01 DIAGNOSIS — Z931 Gastrostomy status: Secondary | ICD-10-CM | POA: Insufficient documentation

## 2012-02-01 DIAGNOSIS — R21 Rash and other nonspecific skin eruption: Secondary | ICD-10-CM | POA: Insufficient documentation

## 2012-02-01 DIAGNOSIS — E860 Dehydration: Secondary | ICD-10-CM | POA: Insufficient documentation

## 2012-02-01 DIAGNOSIS — F0390 Unspecified dementia without behavioral disturbance: Secondary | ICD-10-CM | POA: Insufficient documentation

## 2012-02-01 DIAGNOSIS — W19XXXA Unspecified fall, initial encounter: Secondary | ICD-10-CM | POA: Insufficient documentation

## 2012-02-01 LAB — COMPREHENSIVE METABOLIC PANEL
ALT: 21 U/L (ref 0–35)
AST: 24 U/L (ref 0–37)
Albumin: 3.1 g/dL — ABNORMAL LOW (ref 3.5–5.2)
Calcium: 9.2 mg/dL (ref 8.4–10.5)
GFR calc Af Amer: >90 mL/min (ref >90)
Potassium: 4.2 mEq/L (ref 3.5–5.1)
Sodium: 132 mEq/L — ABNORMAL LOW (ref 135–145)
Total Protein: 7.6 g/dL (ref 6.0–8.3)

## 2012-02-01 LAB — CBC WITH DIFFERENTIAL/PLATELET
Basophils Absolute: 0 10*3/uL (ref 0.0–0.1)
Eosinophils Absolute: 0.3 10*3/uL (ref 0.0–0.7)
Eosinophils Relative: 5 % (ref 0–5)
Hemoglobin: 10.2 g/dL — ABNORMAL LOW (ref 12.0–15.0)
MCH: 24.4 pg — ABNORMAL LOW (ref 26.0–34.0)
MCV: 78 fL (ref 78.0–100.0)
Neutrophils Relative %: 49 % (ref 43–77)
Platelets: 342 10*3/uL (ref 150–400)
RBC: 4.18 MIL/uL (ref 3.87–5.11)
RDW: 16.7 % — ABNORMAL HIGH (ref 11.5–15.5)

## 2012-02-01 LAB — URINALYSIS, ROUTINE W REFLEX MICROSCOPIC
Glucose, UA: NEGATIVE mg/dL
Leukocytes, UA: NEGATIVE
Protein, ur: NEGATIVE mg/dL
Specific Gravity, Urine: 1.015 (ref 1.005–1.030)
pH: 5 (ref 5.0–8.0)

## 2012-02-01 LAB — POCT I-STAT TROPONIN I: Troponin i, poc: 0 ng/mL (ref 0.00–0.08)

## 2012-02-01 MED ORDER — MORPHINE SULFATE 4 MG/ML IJ SOLN
4.0000 mg | Freq: Once | INTRAMUSCULAR | Status: AC
  Administered 2012-02-01: 4 mg via INTRAVENOUS
  Filled 2012-02-01: qty 1

## 2012-02-01 MED ORDER — HEPARIN SOD (PORK) LOCK FLUSH 100 UNIT/ML IV SOLN
250.0000 [IU] | INTRAVENOUS | Status: DC | PRN
  Administered 2012-02-01: 250 [IU]
  Filled 2012-02-01: qty 3

## 2012-02-01 MED ORDER — ONDANSETRON HCL 4 MG/2ML IJ SOLN
4.0000 mg | INTRAMUSCULAR | Status: AC
  Administered 2012-02-01: 4 mg via INTRAVENOUS
  Filled 2012-02-01: qty 2

## 2012-02-01 MED ORDER — HEPARIN SOD (PORK) LOCK FLUSH 100 UNIT/ML IV SOLN
250.0000 [IU] | Freq: Every day | INTRAVENOUS | Status: DC
  Filled 2012-02-01: qty 3

## 2012-02-01 MED ORDER — SODIUM CHLORIDE 0.9 % IV SOLN
20.0000 mL | INTRAVENOUS | Status: DC
  Administered 2012-02-01: 500 mL via INTRAVENOUS

## 2012-02-01 NOTE — ED Provider Notes (Signed)
Medical screening examination/treatment/procedure(s) were conducted as a shared visit with non-physician practitioner(s) and myself.  I personally evaluated the patient during the encounter Patient with an unwitnessed fall at the nursing home but was awake when staff arrived. Small laceration to the head with negative workup. Patient discharged home.  Gwyneth Sprout, MD 02/01/12 [phone]

## 2012-02-01 NOTE — ED Notes (Signed)
Pt has DNR

## 2012-02-01 NOTE — ED Notes (Signed)
Pt arrived by EMS. Pt from [facility].  Reports that she got up to shut her door and she fell hitting her head on the floor with approx 1cm lac on back of head. Facility concerned because this is her 3rd in 36 hrs. Was not evaluated for other falls.

## 2012-02-01 NOTE — ED Notes (Signed)
PTAR paged. 

## 2012-02-01 NOTE — ED Notes (Signed)
Pt a&o to self only which is baseline. Pt c/o of headache to the front of head. No deformities. No loc.

## 2012-02-01 NOTE — ED Provider Notes (Signed)
History     CSN: [id number]  Arrival date & time 02/01/12  0008   First MD Initiated Contact with Patient 02/01/12 0008      Chief Complaint  Patient presents with  . Fall    (Consider location/radiation/quality/duration/timing/severity/associated sxs/prior treatment) HPI  78 y/o demented female INAD presenting with fall causing laceration to occiput and questionable syncope. As per [facility] facility, this is her third fall in 36 hours. Pt is taking lovenox.   Past Medical History  Diagnosis Date  . Hypertension   . Reflux   . Right elbow pain     OTIF  . Dementia   . Depression   . Osteoarthritis   . Pancreatitis 11/2007    HOP  . Angiomyolipoma of kidney     right  . Ulcerative esophagitis 12/05/2007    hx elevated gastrin, severe on EGD by Dr Jena Gauss , h pylori negative  . Hiatal hernia   . S/P colonoscopy 2009    pt reports normal by Dr Lovell Sheehan  . Diabetes mellitus   . Anemia   . Hyponatremia   . Cellulitis   . Esophagitis   . Peptic ulcer   . Insomnia   . Sepsis   . Respiratory failure   . Sepsis   . Clostridium difficile infection   . Esophagitis   . Depression   . Encephalitis   . Urinary tract infection   . Bacteremia   . 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 Perforated prepyloric gastric ulcer s/p antrectomy with Billroth II reconstruction 01/30/2011    Past Surgical History  Procedure Date  . Orif right hip 1999    [facility]  . Umbilical hernia repair 65 months old    [place]  . Esophagogastroduodenoscopy 01/28/2011    Procedure: ESOPHAGOGASTRODUODENOSCOPY (EGD);  Surgeon: Arlyce Harman, MD;  [facility];  Service: Endoscopy;  Laterality: N/A;  . Colonoscopy 01/28/2011    Procedure: COLONOSCOPY;  Surgeon: Arlyce Harman, MD;  [facility];  Service: Endoscopy;  Laterality: N/A;  . Laparotomy 02/04/2011    Procedure: EXPLORATORY LAPAROTOMY;  Surgeon: Fabio Bering;  Location: [facility] ORS;   Service: General;  Laterality: N/A;  . Laparotomy 05/07/2011    Procedure: EXPLORATORY LAPAROTOMY;  Surgeon: Ardeth Sportsman, MD;  Location: [facility] OR;  Service: General;  Laterality: N/A;  lysis of adhesions  . Gastrostomy 05/07/2011    Procedure: GASTROSTOMY;  Surgeon: Ardeth Sportsman, MD;  Location: [facility] OR;  Service: General;  Laterality: N/A;  g-tube / j -tube placement  . Gastrectomy 05/07/2011    Procedure: GASTRECTOMY;  Surgeon: Ardeth Sportsman, MD;  Location: [facility] OR;  Service: General;  Laterality: N/A;  Partial gastrectomy  . Cholecystectomy 05/07/2011    Procedure: CHOLECYSTECTOMY;  Surgeon: Ardeth Sportsman, MD;  Location: [facility] OR;  Service: General;  Laterality: N/A;  open  . Tee without cardioversion 10/21/2011    Procedure: TRANSESOPHAGEAL ECHOCARDIOGRAM (TEE);  Surgeon: Wendall Stade, MD;  Location: [facility] ENDOSCOPY;  Service: Cardiovascular;  Laterality: N/A;    Family History  Problem Relation Age of Onset  . Cancer Mother     pelvic     History  Substance Use Topics  . Smoking status: Never Smoker   . Smokeless tobacco: Current User    Types: Chew   Comment: taken away while she is a SNF  . Alcohol Use: No     Hx of Alcohol dependecy  OB History    Grav Para Term Preterm Abortions TAB SAB Ect Mult Living                  Review of Systems  Allergies  Other and Penicillins  Home Medications   Current Outpatient Rx  Name Route Sig Dispense Refill  . ACETAMINOPHEN 500 MG PO TABS Feeding Tube 500 mg by Feeding Tube route daily as needed. For pain    . AMLODIPINE BESYLATE 10 MG PO TABS Feeding Tube 10 mg by Feeding Tube route daily.    Marland Kitchen VITAMIN D-3 5000 UNITS PO TABS Oral Take 1 tablet by mouth daily.    Marland Kitchen CLONIDINE HCL 0.2 MG/24HR TD PTWK Transdermal Place 1 patch onto the skin once a week. On monday    . DARIFENACIN HYDROBROMIDE ER 15 MG PO TB24 Feeding Tube 15 mg by Feeding Tube route daily.     Marland Kitchen ENOXAPARIN SODIUM 80 MG/0.8ML [place] SOLN Subcutaneous Inject 0.8 mLs  (80 mg total) into the skin every 12 (twelve) hours. 22.4 Syringe   . FENTANYL 50 MCG/HR TD PT72 Transdermal Place 1 patch onto the skin every 3 (three) days.    Marland Kitchen FIORICET 50-325-40 MG PO TABS  TAKE 1 TABLET TWICE DAILY AS NEEDED FOR SEVERE HEADACHE 40 each 1  . INSULIN ASPART 100 UNIT/ML [place] SOLN Subcutaneous Inject 5 Units into the skin 3 (three) times daily as needed. For cbg >150    . METOPROLOL TARTRATE 50 MG PO TABS Feeding Tube 50 mg by Feeding Tube route 2 (two) times daily.    Marland Kitchen GLUCERNA 1.5 CAL PO LIQD Oral Take 1,000 mLs by mouth continuous. Rate:20ml/hr  (rate increased on tube feed on 01/01/12)    . ONDANSETRON HCL 4 MG/5ML PO SOLN Oral Take 4 mg by mouth every 8 (eight) hours as needed. For nausea    . PANTOPRAZOLE SODIUM 40 MG PO TBEC Feeding Tube 40 mg by Feeding Tube route daily.    . TRAZODONE HCL 50 MG PO TABS Per Tube Place 50 mg into feeding tube at bedtime as needed. For sleep    . VANCOMYCIN 50 MG/ML [place] SOLUTION Oral Take 2.5 mLs (125 mg total) by mouth every other day. 2.5 mL 0  . VANCOMYCIN 50 MG/ML [place] SOLUTION Oral Take 2.5 mLs (125 mg total) by mouth every 3 (three) days. 2.5 mL 0    BP 146/69  Pulse 88  Temp 98.2 F (36.8 C) (Oral)  Resp 18  SpO2 98%  Physical Exam  Nursing note and vitals reviewed. Constitutional: She appears well-developed and well-nourished. No distress.  HENT:  Head: Normocephalic.  Right Ear: External ear normal.  Left Ear: External ear normal.  Mouth/Throat: Oropharynx is clear and moist.       3 cm. This laceration to posterior scalp the occiput no galeal involvement.  Eyes: Conjunctivae and EOM are normal. Pupils are equal, round, and reactive to light.  Neck: Normal range of motion.       No midline tenderness or step-offs  Cardiovascular: Normal rate, regular rhythm and normal heart sounds.   Pulmonary/Chest: Effort normal and breath sounds normal.  Abdominal: Soft. Bowel sounds are normal. She exhibits no distension and no  mass. There is no tenderness. There is no guarding.       G-tube in place  Musculoskeletal: Normal range of motion.  Neurological: She is alert.       Patient is alert answers questions appropriately and follows directions. Cranial nerves III  through XII intact with coordinated finger to nose and heel to shin. Strength 5 out of 5x4 extremities.   Skin:       Rash to perineal area, patient normally wears diapers.  Psychiatric: She has a normal mood and affect.    ED Course  Procedures (including critical care time)  LACERATION REPAIR Performed by: Wynetta Emery Authorized by: Wynetta Emery Consent: Verbal consent obtained. Risks and benefits: risks, benefits and alternatives were discussed Consent given by: patient Patient identity confirmed: provided demographic data Prepped and Draped in normal sterile fashion Wound explored  Laceration Location: Occiput  Laceration Length: 3cm  No Foreign Bodies seen or palpated  Anesthesia: local infiltration  Local anesthetic: lidocaine 2% without epinephrine  Anesthetic total: 2ml  Irrigation method: syringe Amount of cleaning: standard  Skin closure: Staples   Number of sutures: 2    Patient tolerance: Patient tolerated the procedure well with no immediate complications.  Labs Reviewed  CBC WITH DIFFERENTIAL - Abnormal; Notable for the following:    Hemoglobin 10.2 (*)     HCT 32.6 (*)     MCH 24.4 (*)     RDW 16.7 (*)     All other components within normal limits  COMPREHENSIVE METABOLIC PANEL - Abnormal; Notable for the following:    Sodium 132 (*)     Glucose, Bld 121 (*)     Albumin 3.1 (*)     Alkaline Phosphatase 147 (*)     Total Bilirubin 0.1 (*)     GFR calc non Af Amer 81 (*)     All other components within normal limits  PROTIME-INR - Abnormal; Notable for the following:    Prothrombin Time 16.7 (*)     All other components within normal limits  POCT I-STAT TROPONIN I  POCT CBG (FASTING -  GLUCOSE)-MANUAL ENTRY  URINALYSIS, ROUTINE W REFLEX MICROSCOPIC   Ct Head Wo Contrast  02/01/2012  *RADIOLOGY REPORT*  Clinical Data:  Fall, trauma, occipital injury  CT HEAD WITHOUT CONTRAST CT CERVICAL SPINE WITHOUT CONTRAST  Technique:  Multidetector CT imaging of the head and cervical spine was performed following the standard protocol without intravenous contrast.  Multiplanar CT image reconstructions of the cervical spine were also generated.  Comparison:  04/26/2011  CT HEAD  Findings: Stable diffuse brain atrophy and chronic white matter microvascular ischemic changes throughout the periventricular white matter.  No acute infarction, mass lesion, intracranial hemorrhage, midline shift, herniation, hydrocephalus, or extra-axial fluid collection.  Gray-white matter differentiation maintained. Cisterns patent.  Cerebellar atrophy as well.  Symmetric appearing orbits.  Mastoids and sinuses clear.  Skull appears intact.  IMPRESSION: Stable atrophy and microvascular ischemic white matter changes.  No acute intracranial process.  CT CERVICAL SPINE  Findings: There is extensive cervical spondylosis, degenerative disc disease and facet arthropathy.  These changes are most pronounced at C5-6 and C6-7.  No subluxation or dislocation. Facets aligned.  Negative for fracture, compression deformity, or focal kyphosis.  Degenerative changes at C1-2 articulation also. Normal prevertebral soft tissues.  IMPRESSION: Advanced degenerative changes.  No acute finding by CT.  Original Report Authenticated By: Judie Petit. Ruel Favors, M.D.   Ct Cervical Spine Wo Contrast  02/01/2012  *RADIOLOGY REPORT*  Clinical Data:  Fall, trauma, occipital injury  CT HEAD WITHOUT CONTRAST CT CERVICAL SPINE WITHOUT CONTRAST  Technique:  Multidetector CT imaging of the head and cervical spine was performed following the standard protocol without intravenous contrast.  Multiplanar CT image reconstructions of the cervical spine were  also generated.   Comparison:  04/26/2011  CT HEAD  Findings: Stable diffuse brain atrophy and chronic white matter microvascular ischemic changes throughout the periventricular white matter.  No acute infarction, mass lesion, intracranial hemorrhage, midline shift, herniation, hydrocephalus, or extra-axial fluid collection.  Gray-white matter differentiation maintained. Cisterns patent.  Cerebellar atrophy as well.  Symmetric appearing orbits.  Mastoids and sinuses clear.  Skull appears intact.  IMPRESSION: Stable atrophy and microvascular ischemic white matter changes.  No acute intracranial process.  CT CERVICAL SPINE  Findings: There is extensive cervical spondylosis, degenerative disc disease and facet arthropathy.  These changes are most pronounced at C5-6 and C6-7.  No subluxation or dislocation. Facets aligned.  Negative for fracture, compression deformity, or focal kyphosis.  Degenerative changes at C1-2 articulation also. Normal prevertebral soft tissues.  IMPRESSION: Advanced degenerative changes.  No acute finding by CT.  Original Report Authenticated By: Judie Petit. Ruel Favors, M.D.     1. Dehydration   2. Fall   3. Laceration       MDM  78 year old female with laceration s/p unwitnessed fall at a nursing home. CT head and neck normal. Bloodwork, EKG, tropinin unremarkable.   Pt is dehydrated, I will give small, serial boluses of 500 mL   Date: 02/01/2012  Rate:84   Rhythm: normal sinus rhythm  QRS Axis: normal  Intervals: normal  ST/T Wave abnormalities: normal  Conduction Disutrbances:none  Narrative Interpretation:   Old EKG Reviewed: none available  UA clean  Discussed case with attending who agrees with plan and stability to d/c to home.      Joni Reining Jartavious Mckimmy, PA-C 02/01/12 0230

## 2012-02-24 ENCOUNTER — Other Ambulatory Visit: Payer: Self-pay | Admitting: Internal Medicine

## 2012-02-24 DIAGNOSIS — E559 Vitamin D deficiency, unspecified: Secondary | ICD-10-CM

## 2012-02-24 DIAGNOSIS — R7881 Bacteremia: Secondary | ICD-10-CM

## 2012-02-24 DIAGNOSIS — I742 Embolism and thrombosis of arteries of the upper extremities: Secondary | ICD-10-CM

## 2012-02-24 DIAGNOSIS — I10 Essential (primary) hypertension: Secondary | ICD-10-CM

## 2012-02-24 DIAGNOSIS — R197 Diarrhea, unspecified: Secondary | ICD-10-CM

## 2012-02-24 DIAGNOSIS — E785 Hyperlipidemia, unspecified: Secondary | ICD-10-CM

## 2012-02-24 DIAGNOSIS — I959 Hypotension, unspecified: Secondary | ICD-10-CM

## 2012-02-24 DIAGNOSIS — G9349 Other encephalopathy: Secondary | ICD-10-CM

## 2012-02-24 DIAGNOSIS — D3000 Benign neoplasm of unspecified kidney: Secondary | ICD-10-CM

## 2012-02-24 DIAGNOSIS — K271 Acute peptic ulcer, site unspecified, with perforation: Secondary | ICD-10-CM

## 2012-02-24 DIAGNOSIS — A0472 Enterocolitis due to Clostridium difficile, not specified as recurrent: Secondary | ICD-10-CM

## 2012-02-24 DIAGNOSIS — A419 Sepsis, unspecified organism: Secondary | ICD-10-CM

## 2012-02-24 DIAGNOSIS — E119 Type 2 diabetes mellitus without complications: Secondary | ICD-10-CM

## 2012-02-24 DIAGNOSIS — L0390 Cellulitis, unspecified: Secondary | ICD-10-CM

## 2012-02-24 DIAGNOSIS — Z87440 Personal history of urinary (tract) infections: Secondary | ICD-10-CM

## 2012-02-24 DIAGNOSIS — R1310 Dysphagia, unspecified: Secondary | ICD-10-CM

## 2012-02-24 DIAGNOSIS — F0390 Unspecified dementia without behavioral disturbance: Secondary | ICD-10-CM

## 2012-02-24 DIAGNOSIS — K912 Postsurgical malabsorption, not elsewhere classified: Secondary | ICD-10-CM

## 2012-02-24 DIAGNOSIS — E46 Unspecified protein-calorie malnutrition: Secondary | ICD-10-CM

## 2012-02-24 DIAGNOSIS — R269 Unspecified abnormalities of gait and mobility: Secondary | ICD-10-CM

## 2012-02-24 DIAGNOSIS — K261 Acute duodenal ulcer with perforation: Secondary | ICD-10-CM

## 2012-02-24 DIAGNOSIS — J9600 Acute respiratory failure, unspecified whether with hypoxia or hypercapnia: Secondary | ICD-10-CM

## 2012-02-24 DIAGNOSIS — F329 Major depressive disorder, single episode, unspecified: Secondary | ICD-10-CM

## 2012-02-24 DIAGNOSIS — T80211A Bloodstream infection due to central venous catheter, initial encounter: Secondary | ICD-10-CM

## 2012-02-24 DIAGNOSIS — D649 Anemia, unspecified: Secondary | ICD-10-CM

## 2012-02-24 DIAGNOSIS — G4700 Insomnia, unspecified: Secondary | ICD-10-CM

## 2012-02-24 DIAGNOSIS — T8112XA Postprocedural septic shock, initial encounter: Secondary | ICD-10-CM

## 2012-02-25 ENCOUNTER — Other Ambulatory Visit: Payer: Self-pay | Admitting: Internal Medicine

## 2012-02-25 ENCOUNTER — Ambulatory Visit (HOSPITAL_COMMUNITY)
Admission: RE | Admit: 2012-02-25 | Discharge: 2012-02-25 | Disposition: A | Discharge status: Home / Self Care | Payer: Medicare HMO | Source: Ambulatory Visit | Attending: Internal Medicine | Admitting: Internal Medicine

## 2012-02-25 DIAGNOSIS — K271 Acute peptic ulcer, site unspecified, with perforation: Secondary | ICD-10-CM

## 2012-02-25 DIAGNOSIS — F329 Major depressive disorder, single episode, unspecified: Secondary | ICD-10-CM

## 2012-02-25 DIAGNOSIS — Z87440 Personal history of urinary (tract) infections: Secondary | ICD-10-CM

## 2012-02-25 DIAGNOSIS — R269 Unspecified abnormalities of gait and mobility: Secondary | ICD-10-CM

## 2012-02-25 DIAGNOSIS — F0390 Unspecified dementia without behavioral disturbance: Secondary | ICD-10-CM

## 2012-02-25 DIAGNOSIS — I742 Embolism and thrombosis of arteries of the upper extremities: Secondary | ICD-10-CM

## 2012-02-25 DIAGNOSIS — R1310 Dysphagia, unspecified: Secondary | ICD-10-CM

## 2012-02-25 DIAGNOSIS — D649 Anemia, unspecified: Secondary | ICD-10-CM

## 2012-02-25 DIAGNOSIS — J9600 Acute respiratory failure, unspecified whether with hypoxia or hypercapnia: Secondary | ICD-10-CM

## 2012-02-25 DIAGNOSIS — T80211A Bloodstream infection due to central venous catheter, initial encounter: Secondary | ICD-10-CM

## 2012-02-25 DIAGNOSIS — G4700 Insomnia, unspecified: Secondary | ICD-10-CM

## 2012-02-25 DIAGNOSIS — K209 Esophagitis, unspecified without bleeding: Secondary | ICD-10-CM

## 2012-02-25 DIAGNOSIS — Y849 Medical procedure, unspecified as the cause of abnormal reaction of the patient, or of later complication, without mention of misadventure at the time of the procedure: Secondary | ICD-10-CM | POA: Insufficient documentation

## 2012-02-25 DIAGNOSIS — R197 Diarrhea, unspecified: Secondary | ICD-10-CM

## 2012-02-25 DIAGNOSIS — K912 Postsurgical malabsorption, not elsewhere classified: Secondary | ICD-10-CM

## 2012-02-25 DIAGNOSIS — I10 Essential (primary) hypertension: Secondary | ICD-10-CM

## 2012-02-25 DIAGNOSIS — L0291 Cutaneous abscess, unspecified: Secondary | ICD-10-CM

## 2012-02-25 DIAGNOSIS — E46 Unspecified protein-calorie malnutrition: Secondary | ICD-10-CM

## 2012-02-25 DIAGNOSIS — G9349 Other encephalopathy: Secondary | ICD-10-CM

## 2012-02-25 DIAGNOSIS — E785 Hyperlipidemia, unspecified: Secondary | ICD-10-CM

## 2012-02-25 DIAGNOSIS — T8112XA Postprocedural septic shock, initial encounter: Secondary | ICD-10-CM

## 2012-02-25 DIAGNOSIS — E559 Vitamin D deficiency, unspecified: Secondary | ICD-10-CM

## 2012-02-25 DIAGNOSIS — I959 Hypotension, unspecified: Secondary | ICD-10-CM

## 2012-02-25 DIAGNOSIS — E119 Type 2 diabetes mellitus without complications: Secondary | ICD-10-CM

## 2012-02-25 DIAGNOSIS — K261 Acute duodenal ulcer with perforation: Secondary | ICD-10-CM

## 2012-02-25 DIAGNOSIS — A0472 Enterocolitis due to Clostridium difficile, not specified as recurrent: Secondary | ICD-10-CM

## 2012-02-25 DIAGNOSIS — D3000 Benign neoplasm of unspecified kidney: Secondary | ICD-10-CM

## 2012-02-25 DIAGNOSIS — F3289 Other specified depressive episodes: Secondary | ICD-10-CM

## 2012-02-25 DIAGNOSIS — R7881 Bacteremia: Secondary | ICD-10-CM

## 2012-02-25 DIAGNOSIS — A419 Sepsis, unspecified organism: Secondary | ICD-10-CM

## 2012-02-25 DIAGNOSIS — K9429 Other complications of gastrostomy: Secondary | ICD-10-CM | POA: Insufficient documentation

## 2012-02-25 MED ORDER — IOHEXOL 300 MG/ML  SOLN
50.0000 mL | Freq: Once | INTRAMUSCULAR | Status: AC | PRN
  Administered 2012-02-25: 10 mL via INTRAVENOUS

## 2012-02-28 ENCOUNTER — Encounter (HOSPITAL_COMMUNITY): Payer: Self-pay | Admitting: Emergency Medicine

## 2012-02-28 ENCOUNTER — Emergency Department (HOSPITAL_COMMUNITY)
Admission: EM | Admit: 2012-02-28 | Discharge: 2012-02-28 | Disposition: A | Discharge status: Skilled Nursing Facility | Payer: Medicare HMO | Attending: Emergency Medicine | Admitting: Emergency Medicine

## 2012-02-28 ENCOUNTER — Emergency Department (HOSPITAL_COMMUNITY): Payer: Medicare HMO

## 2012-02-28 DIAGNOSIS — Z794 Long term (current) use of insulin: Secondary | ICD-10-CM | POA: Insufficient documentation

## 2012-02-28 DIAGNOSIS — E119 Type 2 diabetes mellitus without complications: Secondary | ICD-10-CM | POA: Insufficient documentation

## 2012-02-28 DIAGNOSIS — K9423 Gastrostomy malfunction: Secondary | ICD-10-CM

## 2012-02-28 DIAGNOSIS — F0390 Unspecified dementia without behavioral disturbance: Secondary | ICD-10-CM | POA: Insufficient documentation

## 2012-02-28 DIAGNOSIS — Z79899 Other long term (current) drug therapy: Secondary | ICD-10-CM | POA: Insufficient documentation

## 2012-02-28 DIAGNOSIS — I10 Essential (primary) hypertension: Secondary | ICD-10-CM | POA: Insufficient documentation

## 2012-02-28 MED ORDER — IOHEXOL 300 MG/ML  SOLN: 40.0000 mL | Freq: Once | INTRAMUSCULAR | Status: DC | PRN

## 2012-02-28 MED ORDER — LIDOCAINE VISCOUS 2 % MT SOLN
20.0000 mL | Freq: Once | OROMUCOSAL | Status: DC
  Filled 2012-02-28: qty 20

## 2012-02-28 NOTE — ED Provider Notes (Signed)
History     CSN: [id number]  Arrival date & time 02/28/12  1205   First MD Initiated Contact with Patient 02/28/12 1311      Chief Complaint  Patient presents with  . GI Problem    G-tube damaged    (Consider location/radiation/quality/duration/timing/severity/associated sxs/prior treatment) Patient is a 78 y.o. female presenting with GI illness.  GI Problem     78 y.o. female from [facility] with nonfunctional G-tube. Staff states that while was flushing a G-tube burst and also that has been in for an extended period of time. Patient denies any pain. Level V caveat secondary to dementia  Past Medical History  Diagnosis Date  . Hypertension   . Reflux   . Right elbow pain     OTIF  . Dementia   . Depression   . Osteoarthritis   . Pancreatitis 11/2007    [facility]  . Angiomyolipoma of kidney     right  . Ulcerative esophagitis 12/05/2007    hx elevated gastrin, severe on EGD by Dr Jena Gauss , h pylori negative  . Hiatal hernia   . S/P colonoscopy 2009    pt reports normal by Dr Lovell Sheehan  . Diabetes mellitus   . Anemia   . Hyponatremia   . Cellulitis   . Esophagitis   . Peptic ulcer   . Insomnia   . Sepsis   . Respiratory failure   . Sepsis   . Clostridium difficile infection   . Esophagitis   . Depression   . Encephalitis   . Urinary tract infection   . Bacteremia   . 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 Perforated prepyloric gastric ulcer s/p antrectomy with Billroth II reconstruction 01/30/2011    Past Surgical History  Procedure Date  . Orif right hip 1999    [facility]  . Umbilical hernia repair 65 months old    [place]  . Esophagogastroduodenoscopy 01/28/2011    Procedure: ESOPHAGOGASTRODUODENOSCOPY (EGD);  Surgeon: Arlyce Harman, MD;  [facility];  Service: Endoscopy;  Laterality: N/A;  . Colonoscopy 01/28/2011    Procedure: COLONOSCOPY;  Surgeon: Arlyce Harman, MD;  [facility];  Service:  Endoscopy;  Laterality: N/A;  . Laparotomy 02/04/2011    Procedure: EXPLORATORY LAPAROTOMY;  Surgeon: Fabio Bering;  Location: [facility] ORS;  Service: General;  Laterality: N/A;  . Laparotomy 05/07/2011    Procedure: EXPLORATORY LAPAROTOMY;  Surgeon: Ardeth Sportsman, MD;  Location: [facility] OR;  Service: General;  Laterality: N/A;  lysis of adhesions  . Gastrostomy 05/07/2011    Procedure: GASTROSTOMY;  Surgeon: Ardeth Sportsman, MD;  Location: [facility] OR;  Service: General;  Laterality: N/A;  g-tube / j -tube placement  . Gastrectomy 05/07/2011    Procedure: GASTRECTOMY;  Surgeon: Ardeth Sportsman, MD;  Location: [facility] OR;  Service: General;  Laterality: N/A;  Partial gastrectomy  . Cholecystectomy 05/07/2011    Procedure: CHOLECYSTECTOMY;  Surgeon: Ardeth Sportsman, MD;  Location: [facility] OR;  Service: General;  Laterality: N/A;  open  . Tee without cardioversion 10/21/2011    Procedure: TRANSESOPHAGEAL ECHOCARDIOGRAM (TEE);  Surgeon: Wendall Stade, MD;  Location: [facility] ENDOSCOPY;  Service: Cardiovascular;  Laterality: N/A;    Family History  Problem Relation Age of Onset  . Cancer Mother     pelvic     History  Substance Use Topics  . Smoking status: Never Smoker   . Smokeless tobacco: Current User  Types: Chew   Comment: taken away while she is a SNF  . Alcohol Use: No     Hx of Alcohol dependecy     OB History    Grav Para Term Preterm Abortions TAB SAB Ect Mult Living                  Review of Systems  Unable to perform ROS: Dementia    Allergies  Other and Penicillins  Home Medications   Current Outpatient Rx  Name Route Sig Dispense Refill  . ACETAMINOPHEN 500 MG PO TABS Feeding Tube 500 mg by Feeding Tube route daily as needed. For pain    . AMLODIPINE BESYLATE 10 MG PO TABS Per Tube Place 10 mg into feeding tube daily.     Marland Kitchen BUTALBITAL-APAP-CAFFEINE 50-325-40 MG PO TABS Per Tube Place 2 tablets into feeding tube every 8 (eight) hours as needed. For headache    . VITAMIN D3 5000 UNITS PO  CHEW Per Tube Place 1 tablet into feeding tube daily.    Marland Kitchen VITAMIN D3 50000 UNITS PO CAPS Per Tube Place 1 capsule into feeding tube every 7 (seven) days. Take on Thursday    . CLONIDINE HCL 0.2 MG/24HR TD PTWK Transdermal Place 1 patch onto the skin once a week. On monday    . DARIFENACIN HYDROBROMIDE ER 15 MG PO TB24 Per Tube Place 15 mg into feeding tube daily.     Marland Kitchen DIPHENHYDRAMINE HCL 25 MG PO TABS Per Tube Place 25 mg into feeding tube every 6 (six) hours as needed. For itching.    Marland Kitchen ENOXAPARIN SODIUM 80 MG/0.8ML [place] SOLN Subcutaneous Inject 80 mg into the skin every 12 (twelve) hours.    . FENTANYL 50 MCG/HR TD PT72 Transdermal Place 1 patch onto the skin every 3 (three) days.    . INSULIN ASPART 100 UNIT/ML [place] SOLN Subcutaneous Inject 5 Units into the skin 3 (three) times daily as needed. For cbg >150 take 5 units 15 minutes before or 30 minutes after meal    . METOPROLOL TARTRATE 50 MG PO TABS Feeding Tube 50 mg by Feeding Tube route 2 (two) times daily.    Marland Kitchen GLUCERNA 1.5 CAL PO LIQD Oral Take 1,000 mLs by mouth continuous. Rate:48ml/hr  (rate increased on tube feed on 01/01/12)    . ONDANSETRON HCL 4 MG/5ML PO SOLN Per Tube Place 4 mg into feeding tube every 8 (eight) hours as needed. For nausea    . PANTOPRAZOLE SODIUM 40 MG PO TBEC Feeding Tube 40 mg by Feeding Tube route daily.    Marland Kitchen RANITIDINE HCL 75 MG PO TABS Per Tube Place 75 mg into feeding tube every 6 (six) hours as needed. For itching.    Marland Kitchen VANCOMYCIN HCL 125 MG PO CAPS Per Tube Place 125 mg into feeding tube daily.    Marland Kitchen ZOLPIDEM TARTRATE 5 MG PO TABS Per Tube Place 5 mg into feeding tube at bedtime as needed. For insomnia.      BP 155/72  Pulse 95  Temp 98.4 F (36.9 C) (Oral)  Resp 18  SpO2 100%  Physical Exam  Nursing note and vitals reviewed. Constitutional: She is oriented to person, place, and time. She appears well-developed and well-nourished. No distress.  HENT:  Head: Normocephalic.  Eyes: Conjunctivae and EOM  are normal. Pupils are equal, round, and reactive to light.  Neck: Normal range of motion.  Cardiovascular: Normal rate.   Pulmonary/Chest: Effort normal.  Abdominal: Soft. Bowel sounds are  normal. She exhibits no distension and no mass. There is no tenderness. There is no rebound and no guarding.       22 French G-tube in place with cracked tubing. No signs of infection or irritation at the ostomy. Mucosa pink  Musculoskeletal: Normal range of motion.  Neurological: She is alert and oriented to person, place, and time.  Psychiatric: She has a normal mood and affect.    ED Course  Procedures (including critical care time)  G-tube replacement as follows:  Area surrounding site cleansed with iodine.  18 French PEG tube lubricated with 2% viscous lidocaine. Balloon checked and functioning well.  Nonfunctional tube removed without difficulty.  He tube inserted without difficulty meeting no resistance. Patient appeared comfortable during procedure. 18 mL of tap water used to inflate balloon with no resistance.  Labs Reviewed - No data to display No results found.   1. Gastrostomy tube dysfunction       MDM  78 year old nursing home resident in for G-tube replacement. G-tube replaced without incident. Abdominal x-ray to confirm placement pending.  X-ray shows intragastric PEG tube tip. Patient will be discharged        [facility], PA-C 02/28/12 1712

## 2012-02-28 NOTE — ED Notes (Signed)
PER EMS- [facility]-tube being damaged.  Staff states while flushing g-tube and it burst.  Staff stated "the g-tube has been in for some time now".  Pt is alert and oriented and ambulatory.

## 2012-02-28 NOTE — ED Notes (Signed)
[facility] notified to transport pt back to facility.   

## 2012-02-28 NOTE — ED Notes (Signed)
Attempted IV access x2, pt is a difficult stick and was unable to obtain access.

## 2012-02-28 NOTE — ED Notes (Signed)
AVW:[id number]<XB> Expected date:02/28/12<BR> Expected time:11:57 AM<BR> Means of arrival:Ambulance<BR> Comments:<BR> G tube concerns

## 2012-03-04 NOTE — ED Provider Notes (Signed)
Medical screening examination/treatment/procedure(s) were conducted as a shared visit with non-physician practitioner(s) and myself.  I personally evaluated the patient during the encounter   Markise Haymer, MD 03/04/12 0030 

## 2012-03-22 ENCOUNTER — Ambulatory Visit (HOSPITAL_COMMUNITY)
Admission: RE | Admit: 2012-03-22 | Discharge: 2012-03-22 | Disposition: A | Discharge status: Home / Self Care | Payer: Medicare HMO | Source: Ambulatory Visit | Attending: Internal Medicine | Admitting: Internal Medicine

## 2012-03-22 ENCOUNTER — Other Ambulatory Visit: Payer: Self-pay | Admitting: Internal Medicine

## 2012-03-22 DIAGNOSIS — K9429 Other complications of gastrostomy: Secondary | ICD-10-CM | POA: Insufficient documentation

## 2012-03-22 DIAGNOSIS — R7881 Bacteremia: Secondary | ICD-10-CM

## 2012-03-22 DIAGNOSIS — Y849 Medical procedure, unspecified as the cause of abnormal reaction of the patient, or of later complication, without mention of misadventure at the time of the procedure: Secondary | ICD-10-CM | POA: Insufficient documentation

## 2012-03-22 DIAGNOSIS — R1310 Dysphagia, unspecified: Secondary | ICD-10-CM

## 2012-03-22 MED ORDER — IOHEXOL 300 MG/ML  SOLN
50.0000 mL | Freq: Once | INTRAMUSCULAR | Status: AC | PRN
  Administered 2012-03-22: 20 mL

## 2012-03-22 NOTE — Procedures (Signed)
Successful gastrostomy declog with syringe flush Confirmed in stomach along the GJ anastomosis Ready for use

## 2012-03-24 ENCOUNTER — Telehealth (HOSPITAL_COMMUNITY): Payer: Self-pay | Admitting: *Deleted

## 2012-03-29 ENCOUNTER — Encounter (INDEPENDENT_AMBULATORY_CARE_PROVIDER_SITE_OTHER): Payer: Self-pay | Admitting: Surgery

## 2012-03-29 ENCOUNTER — Ambulatory Visit (INDEPENDENT_AMBULATORY_CARE_PROVIDER_SITE_OTHER): Payer: Medicare HMO | Admitting: Surgery

## 2012-03-29 VITALS — BP 132/70 | HR 62 | Temp 98.0°F | Resp 18 | Ht 66.0 in | Wt 144.0 lb

## 2012-03-29 DIAGNOSIS — E440 Moderate protein-calorie malnutrition: Secondary | ICD-10-CM

## 2012-03-29 DIAGNOSIS — B9681 Helicobacter pylori [H. pylori] as the cause of diseases classified elsewhere: Secondary | ICD-10-CM

## 2012-03-29 DIAGNOSIS — K259 Gastric ulcer, unspecified as acute or chronic, without hemorrhage or perforation: Secondary | ICD-10-CM

## 2012-03-29 DIAGNOSIS — K316 Fistula of stomach and duodenum: Secondary | ICD-10-CM

## 2012-03-29 NOTE — Progress Notes (Signed)
Subjective:     Patient ID: [name], female   DOB: [birth year], 78 y.o.   MRN: [id number]  Wound Check    JULLIAN PREVITI  Mar 16, 1932 [id number]  Patient Care Team: Kermit Balo, DO as PCP - General (Geriatric Medicine) Fabio Bering, MD as Consulting Physician (General Surgery) [place] Bali, MD as Consulting Physician (Gastroenterology) Ardeth Sportsman, MD as Consulting Physician (General Surgery) Maxwell Caul, MD as Attending Physician (Internal Medicine)  This patient is a 78 y.o.female who presents today for surgical evaluation.   Diagnosis: Perforation of prepyloric ulcer  Procedure: Exploratory laparotomy. Cholecystectomy. Antrectomy and resection of duodenal bulb. Billroth II reconstruction with loop gastrojejunostomy.  Tube gastrostomy & jejunostomy. [id number]  The patient comes today from [place] claims to have a bowel movement every day.[facility] by herself.   She has been readmitted to the hospital for a line sepsis and other infections this year.  There were problems with the gastrostomy tube clogging up.  It has been intermittently replaced or flush.  For some reason, it has been gradually downsize to a 44 [place].  It looks like she is no longer on any IV parenteral nutrition.  She has lost a fair amount of weight.  She does occasionally eat.    Patient Active Problem List  Diagnosis  . DIABETES MELLITUS, TYPE II  . HYPERLIPIDEMIA  . DEPRESSION  . HYPERTENSION  . DIVERTICULOSIS OF COLON  . Anemia  . Perforated prepyloric gastric ulcer s/p antrectomy with Billroth II reconstruction  . Esophagitis, erosive  . 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 PICC line infection  . DVT (deep venous thrombosis)  . C. difficile colitis  . Dementia  . Bacteremia  . Malnutrition of moderate degree    Past Medical History  Diagnosis Date  . Hypertension   . Reflux   . Right elbow pain     OTIF  . Dementia   . Depression   .  Osteoarthritis   . Pancreatitis 11/2007    HOP  . Angiomyolipoma of kidney     right  . Ulcerative esophagitis 12/05/2007    hx elevated gastrin, severe on EGD by Dr Jena Gauss , h pylori negative  . Hiatal hernia   . S/P colonoscopy 2009    pt reports normal by Dr Lovell Sheehan  . Diabetes mellitus   . Anemia   . Hyponatremia   . Cellulitis   . Esophagitis   . Peptic ulcer   . Insomnia   . Sepsis   . Respiratory failure   . Sepsis   . Clostridium difficile infection   . Esophagitis   . Depression   . Encephalitis   . Urinary tract infection   . Bacteremia   . 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 Perforated prepyloric gastric ulcer s/p antrectomy with Billroth II reconstruction 01/30/2011    Past Surgical History  Procedure Date  . Orif right hip 1999    [facility]  . Umbilical hernia repair 47 months old    [place]  . Esophagogastroduodenoscopy 01/28/2011    Procedure: ESOPHAGOGASTRODUODENOSCOPY (EGD);  Surgeon: Arlyce Harman, MD;  [facility];  Service: Endoscopy;  Laterality: N/A;  . Colonoscopy 01/28/2011    Procedure: COLONOSCOPY;  Surgeon: Arlyce Harman, MD;  [facility];  Service: Endoscopy;  Laterality: N/A;  . Laparotomy 02/04/2011    Procedure: EXPLORATORY LAPAROTOMY;  Surgeon: Fabio Bering;  Location: [facility]  ORS;  Service: General;  Laterality: N/A;  . Laparotomy 05/07/2011    Procedure: EXPLORATORY LAPAROTOMY;  Surgeon: Ardeth Sportsman, MD;  Location: [facility] OR;  Service: General;  Laterality: N/A;  lysis of adhesions  . Gastrostomy 05/07/2011    Procedure: GASTROSTOMY;  Surgeon: Ardeth Sportsman, MD;  Location: [facility] OR;  Service: General;  Laterality: N/A;  g-tube / j -tube placement  . Gastrectomy 05/07/2011    Procedure: GASTRECTOMY;  Surgeon: Ardeth Sportsman, MD;  Location: [facility] OR;  Service: General;  Laterality: N/A;  Partial gastrectomy  . Cholecystectomy 05/07/2011    Procedure: CHOLECYSTECTOMY;  Surgeon: Ardeth Sportsman,  MD;  Location: [facility] OR;  Service: General;  Laterality: N/A;  open  . Tee without cardioversion 10/21/2011    Procedure: TRANSESOPHAGEAL ECHOCARDIOGRAM (TEE);  Surgeon: Wendall Stade, MD;  Location: [facility] ENDOSCOPY;  Service: Cardiovascular;  Laterality: N/A;    History   Social History  . Marital Status: Widowed    Spouse Name: N/A    Number of Children: 1  . Years of Education: N/A   Occupational History  . retired     Social History Main Topics  . Smoking status: Never Smoker   . Smokeless tobacco: Current User    Types: Chew   Comment: taken away while she is a SNF  . Alcohol Use: No     Hx of Alcohol dependecy   . Drug Use: No  . Sexually Active: No   Other Topics Concern  . Not on file   Social History Narrative  . No narrative on file    Family History  Problem Relation Age of Onset  . Cancer Mother     pelvic     Current outpatient prescriptions:acetaminophen (TYLENOL) 500 MG tablet, 500 mg by Feeding Tube route daily as needed. For pain, Disp: , Rfl: ;  amLODipine (NORVASC) 10 MG tablet, Place 10 mg into feeding tube daily. , Disp: , Rfl: ;  butalbital-acetaminophen-caffeine (FIORICET, ESGIC) 50-325-40 MG per tablet, Place 2 tablets into feeding tube every 8 (eight) hours as needed. For headache, Disp: , Rfl:  Cholecalciferol (VITAMIN D3) 5000 UNITS CHEW, Place 1 tablet into feeding tube daily., Disp: , Rfl: ;  Cholecalciferol (VITAMIN D3) 50000 UNITS CAPS, Place 1 capsule into feeding tube every 7 (seven) days. Take on Thursday, Disp: , Rfl: ;  cloNIDine (CATAPRES - DOSED IN MG/24 HR) 0.2 mg/24hr patch, Place 1 patch onto the skin once a week. On monday, Disp: , Rfl:  darifenacin (ENABLEX) 15 MG 24 hr tablet, Place 15 mg into feeding tube daily. , Disp: , Rfl: ;  diphenhydrAMINE (BENADRYL) 25 MG tablet, Place 25 mg into feeding tube every 6 (six) hours as needed. For itching., Disp: , Rfl: ;  enoxaparin (LOVENOX) 80 MG/0.8ML injection, Inject 80 mg into the skin every 12  (twelve) hours., Disp: , Rfl: ;  fentaNYL (DURAGESIC - DOSED MCG/HR) 50 MCG/HR, Place 1 patch onto the skin every 3 (three) days., Disp: , Rfl:  insulin aspart (NOVOLOG) 100 UNIT/ML injection, Inject 5 Units into the skin 3 (three) times daily as needed. For cbg >150 take 5 units 15 minutes before or 30 minutes after meal, Disp: , Rfl: ;  metoprolol (LOPRESSOR) 50 MG tablet, 50 mg by Feeding Tube route 2 (two) times daily., Disp: , Rfl: ;  Nutritional Supplements (GLUCERNA 1.5 CAL) LIQD, Take 1,000 mLs by mouth continuous. Rate:54ml/hr  (rate increased on tube feed on 01/01/12), Disp: , Rfl:  ondansetron (ZOFRAN)  4 MG/5ML solution, Place 4 mg into feeding tube every 8 (eight) hours as needed. For nausea, Disp: , Rfl: ;  pantoprazole (PROTONIX) 40 MG tablet, 40 mg by Feeding Tube route daily., Disp: , Rfl: ;  ranitidine (ZANTAC) 75 MG tablet, Place 75 mg into feeding tube every 6 (six) hours as needed. For itching., Disp: , Rfl: ;  vancomycin (VANCOCIN) 125 MG capsule, Place 125 mg into feeding tube daily., Disp: , Rfl:  zolpidem (AMBIEN) 5 MG tablet, Place 5 mg into feeding tube at bedtime as needed. For insomnia., Disp: , Rfl:   Allergies  Allergen Reactions  . Other Swelling    Pinto beans cause swelling and hives  . Penicillins Swelling    BP 132/70  Pulse 62  Temp 98 F (36.7 C) (Temporal)  Resp 18  Ht 5\' 6"  (1.676 m)  Wt 144 lb (65.318 kg)  BMI 23.24 kg/m2     Review of Systems  Constitutional: Positive for unexpected weight change. Negative for fever, chills, diaphoresis and appetite change.  HENT: Negative for ear pain, sore throat and trouble swallowing.   Eyes: Negative for photophobia and visual disturbance.  Respiratory: Negative for cough and choking.   Cardiovascular: Negative for chest pain and palpitations.  Gastrointestinal: Negative for nausea, vomiting, abdominal pain, constipation, blood in stool, abdominal distention, anal bleeding and rectal pain.  Genitourinary:  Negative for dysuria, frequency and difficulty urinating.  Musculoskeletal: Positive for myalgias and arthralgias. Negative for gait problem.  Skin: Negative for color change, pallor and rash.  Neurological: Negative for dizziness, speech difficulty, weakness and numbness.  Hematological: Negative for adenopathy.  Psychiatric/Behavioral: Negative for confusion and agitation. The patient is not nervous/anxious.        Objective:   Physical Exam  Constitutional: She is oriented to person, place, and time. She appears well-developed. She is cooperative. She does not have a sickly appearance. She does not appear ill. No distress.  HENT:  Head: Normocephalic.  Mouth/Throat: Oropharynx is clear and moist. No oropharyngeal exudate.  Eyes: Conjunctivae normal and EOM are normal. Pupils are equal, round, and reactive to light. No scleral icterus.  Neck: Normal range of motion. No tracheal deviation present.  Cardiovascular: Normal rate and intact distal pulses.   Pulmonary/Chest: Effort normal. No respiratory distress. She exhibits no tenderness.  Abdominal: Soft. She exhibits no distension and no mass. There is no tenderness. Hernia confirmed negative in the right inguinal area and confirmed negative in the left inguinal area.         Incisions clean with normal healing ridges.  No hernias  Gtube LUQ flushes OK  All drains out - scant granulation at openings  Genitourinary: No vaginal discharge found.  Musculoskeletal: Normal range of motion. She exhibits no tenderness.  Lymphadenopathy:       Right: No inguinal adenopathy present.       Left: No inguinal adenopathy present.  Neurological: She is oriented to person, place, and time. No cranial nerve deficit. She exhibits normal muscle tone. Coordination normal.       Prefers sitting in wheelchair  Skin: Skin is warm and dry. No rash noted. She is not diaphoretic.  Psychiatric: She has a normal mood and affect. Her speech is normal and  behavior is normal. Cognition and memory are impaired.       Much more interactive now.  Not withdrawn.  Answers questions.  Follows commands.   Ct Abdomen Pelvis W Contrast  06/26/2011  *RADIOLOGY REPORT*  Clinical Data: Intra-abdominal  abscess  CT ABDOMEN AND PELVIS WITH CONTRAST  Technique:  Multidetector CT imaging of the abdomen and pelvis was performed following the standard protocol during bolus administration of intravenous contrast.  Contrast: 80mL OMNIPAQUE IOHEXOL 300 MG/ML IV SOLN  Comparison: 06/15/2011  Findings: Trace bilateral pleural effusions with associated lower lobe atelectasis.  Mild cardiomegaly with a small pericardial effusion.  Coronary atherosclerosis.  Liver, spleen, and adrenal glands are within normal limits.  Pancreas is notable for a subcentimeter hypodense lesion in the pancreatic tail (series 2/image 30).  Stable hypodense bilateral renal lesions.  No hydronephrosis.  Gastrostomy tube.  Status post Billroth II.  No evidence of bowel obstruction.  Normal appendix.  Colonic diverticulosis, without associated inflammatory changes.  Atherosclerotic calcifications of the abdominal aorta and branch vessels.  Near complete resolution of fluid collection anterior to the lateral left hepatic lobe, measuring approximately 3.8 x 0.9 cm (series 2/image 25), with indwelling pigtail drain.  Additional 2.0 x 2.9 cm fluid collection just medial to the left hepatic lobe is improved (series 2/image 31).  No suspicious abdominopelvic lymphadenopathy.  Peroneal dialysis catheter.  Uterus and bilateral ovaries are unremarkable.  Bladder is decompressed by indwelling Foley catheter.  Degenerative changes of the visualized thoracolumbar spine.  Right total hip arthroplasty without evidence of hardware complication.  IMPRESSION: Near complete resolution of fluid collection anterior to the left hepatic level with indwelling pigtail drain, now measuring 3.8 x 0.9 cm.  Additional 2.0 x 2.9 cm fluid  collection medial to the left hepatic lobe, improved.  Stable ancillary findings as above.  Original Report Authenticated By: Charline Bills, M.D.   Dg [name] W/water Sol Cm  07/02/2011  *RADIOLOGY REPORT*  Clinical Data: History of Billroth II procedure for peptic ulcer. Prior upper GI demonstrate rapid emptying from the stomach with opacification of the cecum.  Evaluate for small bowel fistula or short gut.  UPPER GIWITH WATER SOLUTION CONTRAST  Technique: Upper GI series performed with water soluble contrast.  Comparison: Upper GI of 05/25/2011.  CT of 05/27/2011.  Findings: Pre procedure scout film demonstrates right hip arthroplasty.  Moderate stool within the rectum.  Injection of a total of 150 ml of Omnipaque-300 into the gastrostomy tube.  Normal postoperative appearance of the stomach. Gastroesophageal reflux again identified.  Promptly arising from the medial side of the stomach, at approximately the 8 o'clock position, contrast fills a small bowel loop which is likely in the mid to distal small bowel.  This promptly fills the cecum/terminal ileum.  Subsequently, primarily with RPO positioning, a small bowel loop fills from the gastric remnant at approximately the 6 o'clock position.  Example series 58.  This also is likely the mid to distal small bowel, promptly filling the cecum.  Postprocedure overhead film demonstrates no free perforation or extravasation of contrast.  IMPRESSION: 2 foci of communication between the residual stomach and small bowel.  The more inferior communication is felt to represent the gastrojejunostomy site.  Anastomosis is likely within the mid to distal small bowel, given relative small amount of remaining bowel between the stomach and terminal ileum.  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  This communication is also to the mid to distal small bowel, promptly filling the cecum.  Gastroesophageal reflux.  Original Report Authenticated  By: Consuello Bossier, M.D.     Assessment:     S/p Resection of perforated ulcer with gastroileal fistula and shortgut-like symdrome  Poorly functioning G-tube    Plan:  Malnutrition.  Albumin above three now.  Actually looks better than I've seen her.  More mentally alert.  A more healthy weight.  TNA/TPN IV nutrition as needed.  Seems like this is stopped.  While I am skeptical she can enough nutrition as is, her improvement in nutrition and mental status argues against this.  I went ahead and up sized the gastrostomy tube to a 24 [place].  Less likely to get clogged up this way.  Some discomfort with this but she tolerated it well.  G-tube aspirate gastric contents it flushed easily.  The only way to close the gastro-ileal fistula and avoid needing a G-tube forever would be surgery.  Exploration of abdomen with takedown of gastroileal fistula, EGD (& possible revision of loop gastrojejunostomy).  She is high risk for morbidity, so would be best to minimize with better nutrition/health.  She would need cardiac clearance first. The patient seemed less interested in pursuing with this.   I noted there were no easy solutions.   I did not force the to make a decision with surgery and I do not think strongly for it. However they seem more open to it now than when I saw him a month ago.  I would wait another month before proceeding. Nonew abscess or fluid collection.  Would need cardiac clearance to see what her operative risk would be. If it strongly prohibitive, then I would not be strongly for surgery upon her. No path is easy for her  I see no open tract or fistula. Wound looks healed to me  RTC PRN

## 2012-03-29 NOTE — Patient Instructions (Addendum)
Your gastrostomy G-tube has been up sized to a 24 [place] tube.  Gastrostomy Tube, Adult A gastrostomy tube is a tube that is placed into the stomach. It is also called a "G-tube." This tube is used for:  Feeding.  Giving medication. CLEANING THE G-TUBE SITE  Wash your hands with soap and water.  Remove the old dressing (if any). Some styles of G-tubes may need a dressing inserted between the skin and the G-tube. Other types of G-tubes do not require a dressing. Ask your caregiver if a dressing is needed.  Check the area where the tube enters the skin (insertion site) for redness, swelling, or pus-like (purulent) drainage. A small amount of clear or tan liquid drainage is normal. Check to make sure scar tissue (skin) is not growing around the insertion site. This could have a raised, bumpy appearance.  A cotton swab can be used to clean the skin around the tube:  When the G-tube is first put in, a normal saline solution or water can be used to clean the skin.  Mild soap and warm water can be used when the skin around the G-tube site has healed.  Roll the cotton swab around the G-tube insertion site to remove any drainage or crusting at the insertion site. RESIDUALS Feeding tube residuals are the amount of liquids that are in the stomach at any given time. Residuals may be checked before giving feedings, medications, or as instructed by your caregiver.  Ask your caregiver if there are instances when you would not start tube feedings depending on the amount or type of contents withdrawn from the stomach.  Check residuals by attaching a syringe to the G-tube and pull back on the syringe plunger. Note the amount and return the residual back into the stomach. FLUSHING THE G-TUBE  The G-tube should be periodically flushed with clean warm water to keep it from clogging.  Flush the G-tube after feedings or medications. Draw up 30 mLs of warm water in a syringe. Connect the syringe to the  G-tube and slowly push the water into the tube.  Do not push feedings, medications, or flushes rapidly. Flush the G-tube gently and slowly.  Only use syringes made for G-tubes to flush medications or feedings.  Your caregiver may want the G-tube flushed more often or with more water. If this is the case, follow your caregiver's instructions. FEEDINGS Your caregiver will determine whether feedings are given as a bolus (a certain amount given at one time and at scheduled times) or whether feedings will be given continuously on a feeding pump.   Formulas should be given at room temperature.  If feedings are continuous, no more than 4 hours worth of feedings should be placed in the feeding bag. This helps prevent spoilage or accidental excess infusion.  Cover and place unused formula in the refrigerator.  If feedings are continuous, stop the feedings when medications or flushes are given. Be sure to restart the feedings.  Feeding bags and syringes should be replaced as instructed by your caregiver. GIVING MEDICATION   In general, it is best if all medications are in a liquid form for G-tube administration. Liquid medications are less likely to clog the G-tube.  Mix the liquid medication with 30 mLs (or amount recommended by your caregiver) of warm water.  Draw up the medication into the syringe.  Attach the syringe to the G-tube and slowly push the mixture into the G-tube.  After giving the medication, draw up 30 mLs of  warm water in the syringe and slowly flush the G-tube.  For pills or capsules, check with your caregiver first before crushing medications. Some pills are not effective if they are crushed. Some capsules are sustained release medications.  If appropriate, crush the pill or capsule and mix with 30 mLs of warm water. Using the syringe, slowly push the medication through the tube, then flush the tube with another 30 mLs of tap water. G-TUBE PROBLEMS G-tube was pulled  out.  Cause: May have been pulled out accidentally.  Solutions: Cover the opening with clean dressing and tape. Call your caregiver right away. The G-tube should be put in as soon as possible (within 4 hours) so the G-tube opening (tract) does not close. The G-tube needs to be put in at a healthcare setting. An X-ray needs to be done to confirm placement before the G-tube can be used again. Redness, irritation, soreness, or foul odor around the gastrostomy site.  Cause: May be caused by leakage or infection.  Solutions: Call your caregiver right away. Large amount of leakage of fluid or mucus-like liquid present (a large amount means it soaks clothing).  Cause: Many reasons could cause the G-tube to leak.  Solutions: Call your caregiver to discuss the amount of leakage. Skin or scar tissue appears to be growing where tube enters skin.   Cause: Tissue growth may develop around the insertion site if the G-tube is moved or pulled on excessively.  Solutions: Secure tube with tape so that excess movement does not occur. Call your caregiver. G-tube is clogged.  Cause: Thick formula or medication.  Solutions: Try to slowly push warm water into the tube with a large syringe. Never try to push any object into the tube to unclog it. Do not force fluid into the G-tube. If you are unable to unclog the tube, call your caregiver right away. TIPS  Head of Bed [facility]) position refers to the upright position of a person's upper body.  When giving medications or a feeding bolus, keep the HOB up as told by your caregiver. Do this during the feeding and for 1 hour after the feeding or medication administration.  If continuous feedings are being given, it is best to keep the [facility] up as told by your caregiver. When ADLs (Activities of Daily Living) are performed and the [facility] needs to be flat, be sure to turn the feeding pump off. Restart the feeding pump when the [facility] is returned to the recommended height.  Do  not pull or put tension on the tube.  To prevent fluid backflow, kink the G-tube before removing the cap or disconnecting a syringe.  Check the G-tube length every day. Measure from the insertion site to the end of the G-tube. If the length is longer than previous measurements, the tube may be coming out. Call your caregiver if you notice increasing G-tube length.  Oral care, such as brushing teeth, must be continued.  You may need to remove excess air (vent) from the G-tube. Your caregiver will tell you if this is needed.  Always call your caregiver if you have questions or problems with the G-tube. SEEK IMMEDIATE MEDICAL CARE IF:   You have severe abdominal pain, tenderness, or abdominal bloating(distension).  You have nausea or vomiting.  You are constipated or have problems moving your bowels.  The G-tube insertion site is red, swollen, has a foul smell, or has yellow or brown drainage.  You have difficulty breathing or shortness of breath.  You  have a fever.  You have a large amount of feeding tube residuals.  The G-tube is clogged and cannot be flushed. MAKE SURE YOU:   Understand these instructions.  Will watch your condition.  Will get help right away if you are not doing well or get worse. Document Released: 08/24/2001 Document Revised: 09/07/2011 Document Reviewed: 10/11/2007 [facility] Patient Information 2013 [place], [place].  If you wish to consider surgery to fix the abnormal connection between your stomach and intestine (Gastroileal fistula), this would require clearance by medicine and cardiology.

## 2012-09-16 ENCOUNTER — Non-Acute Institutional Stay (SKILLED_NURSING_FACILITY): Payer: Medicare HMO | Admitting: Internal Medicine

## 2012-09-16 ENCOUNTER — Other Ambulatory Visit: Payer: Self-pay | Admitting: *Deleted

## 2012-09-16 DIAGNOSIS — L0390 Cellulitis, unspecified: Secondary | ICD-10-CM | POA: Insufficient documentation

## 2012-09-16 DIAGNOSIS — G8929 Other chronic pain: Secondary | ICD-10-CM

## 2012-09-16 MED ORDER — MORPHINE SULFATE 20 MG/5ML PO SOLN: ORAL | Status: DC

## 2012-09-16 NOTE — Assessment & Plan Note (Signed)
Persists and recent cellulitis has worsened it. Will add fentanyl patch to her regimen of morphine q2h prn and reassess. Has regular bowel regimen.

## 2012-09-16 NOTE — Addendum Note (Signed)
Addended byOneal Grout on: 09/16/2012 05:09 PM   Modules accepted: Orders

## 2012-09-16 NOTE — Assessment & Plan Note (Signed)
Has purulent drainage from g tube site. Will send for wound culture (aerobic/ anaerobic) and start her empirically on doxycycline 100 mg bid for 5 days. Will adjust her pain medications and reassess

## 2012-09-16 NOTE — Progress Notes (Signed)
Subjective:    Patient ID: KORINNA TAT, female    DOB: 13-Mar-1932, 79 y.o.   MRN: [id number]  Allergies  Allergen Reactions  . Other Swelling    Pinto beans cause swelling and hives  . Penicillins Swelling    HPI  79 y/o female pt who is a LTR of the facility was seen today for abdominal discomfort. She denies any nausea or vomiting but has pain at g tube site. No fever or chills reported. She is on morphine po q2h prn for pain but still remains uncomfortable as per staff. No falls/ trauma reported. Regular bowel movement. Pt is not a great historian. Unable to provide ros. As per staff, pt slowly declining with po intake and activities. No other concerns from staff  Review of Systems    see hpi Objective:       Physical Exam  Constitutional:  Frail, elderly  HENT:  Head: Normocephalic and atraumatic.  Eyes: Pupils are equal, round, and reactive to light.  Neck: Normal range of motion. Neck supple.  Cardiovascular: Normal rate.   Pulmonary/Chest: Effort normal and breath sounds normal.  Abdominal: Soft. Bowel sounds are normal. She exhibits no mass. There is tenderness. There is no rebound and no guarding.  g tube in place but site has purulent, foul smelling drainage. No bleed noted.   Musculoskeletal: Normal range of motion. She exhibits no edema.  Neurological: She is alert.  Only to self  Skin: Skin is warm and dry.  Erythema around g tube site with drainage. Tenderness on exam  Psychiatric: She has a normal mood and affect.   BP 115/53  Pulse 84  Temp(Src) 98.2 F (36.8 C)  Resp 14  Ht 5\' 6"  (1.676 m)  Wt 121 lb (54.885 kg)  BMI 19.54 kg/m2  SpO2 96%  Patient's Medications  New Prescriptions   No medications on file  Previous Medications   AMLODIPINE (NORVASC) 10 MG TABLET    Take 10 mg by mouth daily.    BUTALBITAL-ACETAMINOPHEN-CAFFEINE (FIORICET, ESGIC) 50-325-40 MG PER TABLET    Take 2 tablets by mouth every 8 (eight) hours as needed. For headache   CHOLECALCIFEROL (VITAMIN D3) 50000 UNITS CAPS    Take 1 capsule by mouth every 7 (seven) days. Take on Thursday   CLONIDINE (CATAPRES) 0.1 MG TABLET    Take 0.1 mg by mouth daily.   DARIFENACIN (ENABLEX) 15 MG 24 HR TABLET    Take 15 mg by mouth daily.    DOXYCYCLINE (VIBRAMYCIN) 100 MG CAPSULE    Take 100 mg by mouth 2 (two) times daily.   FENTANYL (DURAGESIC - DOSED MCG/HR) 50 MCG/HR    Place 1 patch onto the skin every 3 (three) days.   INSULIN ASPART (NOVOLOG) 100 UNIT/ML INJECTION    Inject 5 Units into the skin 3 (three) times daily as needed. For cbg >150 take 5 units 15 minutes before or 30 minutes after meal   LORAZEPAM (ATIVAN) 0.5 MG TABLET    Take 0.5 mg by mouth every 6 (six) hours as needed for anxiety.   METOPROLOL (LOPRESSOR) 50 MG TABLET    Take 50 mg by mouth 2 (two) times daily.    MICONAZOLE (MICOTIN) 2 % CREAM    Apply 1 application topically daily.   ONDANSETRON (ZOFRAN) 4 MG/5ML SOLUTION    Place 4 mg into feeding tube every 8 (eight) hours as needed. For nausea   PANTOPRAZOLE (PROTONIX) 40 MG TABLET    Take 40 mg by  mouth daily.    ZOLPIDEM (AMBIEN) 10 MG TABLET    Take 10 mg by mouth at bedtime.  Modified Medications   Modified Medication Previous Medication   MORPHINE 20 MG/5ML SOLUTION morphine 20 MG/5ML solution      Take 0.25-0.5 ML every 2 hours as needed for pain    Take 0.25-0.5 ML every 2 hours as needed for pain  Discontinued Medications   ACETAMINOPHEN (TYLENOL) 500 MG TABLET    500 mg by Feeding Tube route daily as needed. For pain   CHOLECALCIFEROL (VITAMIN D3) 5000 UNITS CHEW    Place 1 tablet into feeding tube daily.   CLONIDINE (CATAPRES - DOSED IN MG/24 HR) 0.2 MG/24HR PATCH    Place 1 patch onto the skin once a week. On monday   DIPHENHYDRAMINE (BENADRYL) 25 MG TABLET    Place 25 mg into feeding tube every 6 (six) hours as needed. For itching.   ENOXAPARIN (LOVENOX) 80 MG/0.8ML INJECTION    Inject 80 mg into the skin every 12 (twelve) hours.   FENTANYL  (DURAGESIC - DOSED MCG/HR) 50 MCG/HR    Place 1 patch onto the skin every 3 (three) days.   NUTRITIONAL SUPPLEMENTS (GLUCERNA 1.5 CAL) LIQD    Take 1,000 mLs by mouth continuous. Rate:15ml/hr  (rate increased on tube feed on 01/01/12)   RANITIDINE (ZANTAC) 75 MG TABLET    Place 75 mg into feeding tube every 6 (six) hours as needed. For itching.   VANCOMYCIN (VANCOCIN) 125 MG CAPSULE    Place 125 mg into feeding tube daily.   ZOLPIDEM (AMBIEN) 5 MG TABLET    Place 5 mg into feeding tube at bedtime as needed. For insomnia.       Assessment & Plan:  Cellulitis Has purulent drainage from g tube site. Will send for wound culture (aerobic/ anaerobic) and start her empirically on doxycycline 100 mg bid for 5 days. Will adjust her pain medications and reassess  Chronic pain Persists and recent cellulitis has worsened it. Will add fentanyl patch to her regimen of morphine q2h prn and reassess. Has regular bowel regimen.   Future labs/ test- wound culture, cbc with diff

## 2012-09-20 IMAGING — CT CT ABD-PELV W/ CM
2 of 5 series · 17 of 46 positions shown, 19 images · IV contrast (Omnipaque 300)
Comparison: CT abdomen dated 02/18/2008

CLINICAL DATA: Abdominal pain, hernia repair, right renal
angiomyolipoma

CT ABDOMEN AND PELVIS WITH CONTRAST
TECHNIQUE: Multidetector CT imaging of the abdomen and pelvis was
performed following the standard protocol during bolus
administration of intravenous contrast.
Contrast: 100 ml Tmnipaque-K22 IV

[Series 2: abd_pel_with 5.0 b40f · axial · 0.78mm/px · z∈[-443,-53]mm · 14 of 88 slices shown, 16 images]
[im 5/88  soft-tissue]
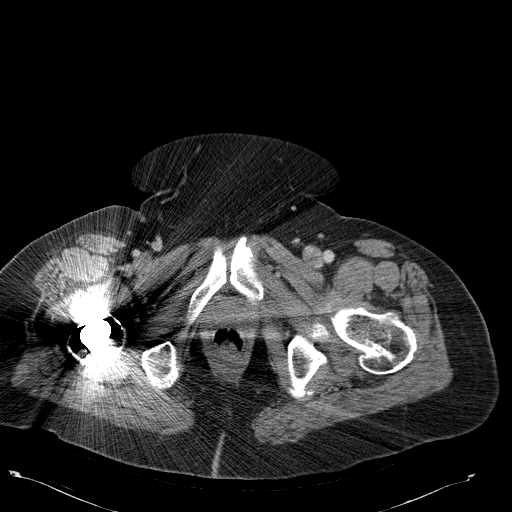
[im 5/88  bone]
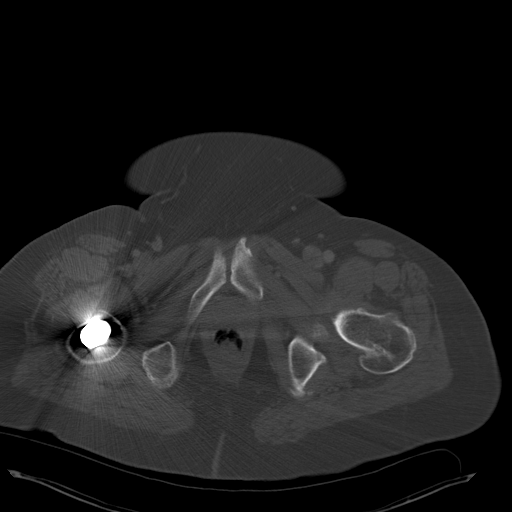
[im 10/88  soft-tissue]
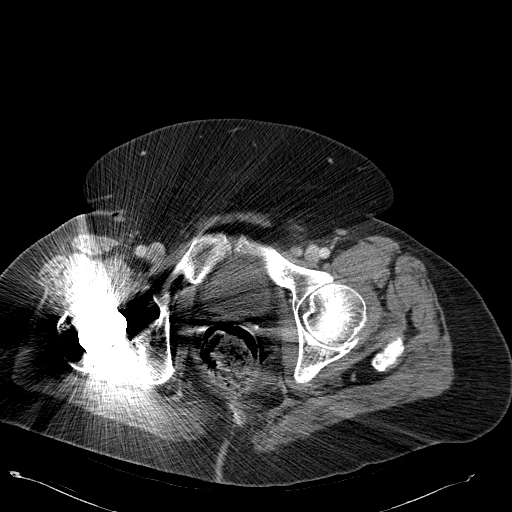
[im 20/88  soft-tissue]
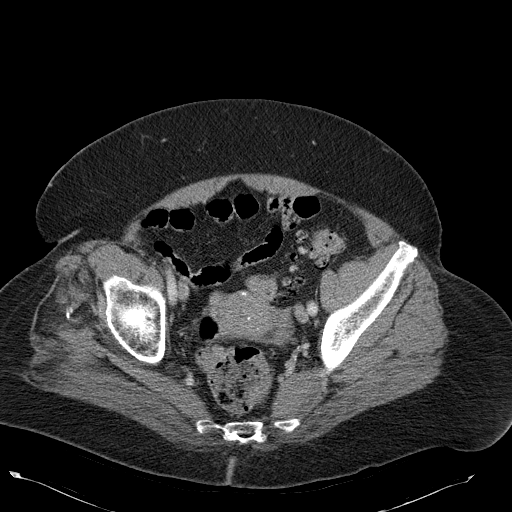
[im 25/88  soft-tissue]
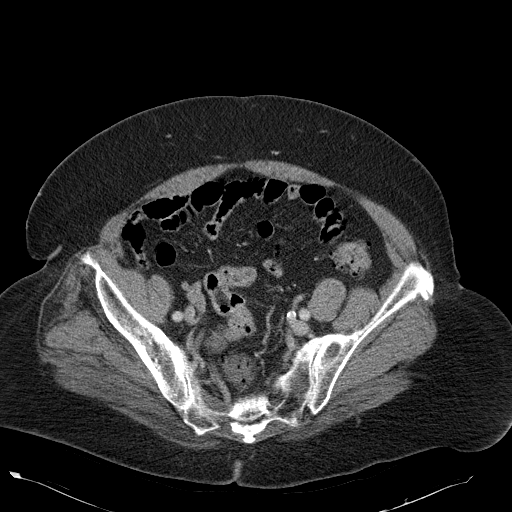
[im 30/88  soft-tissue]
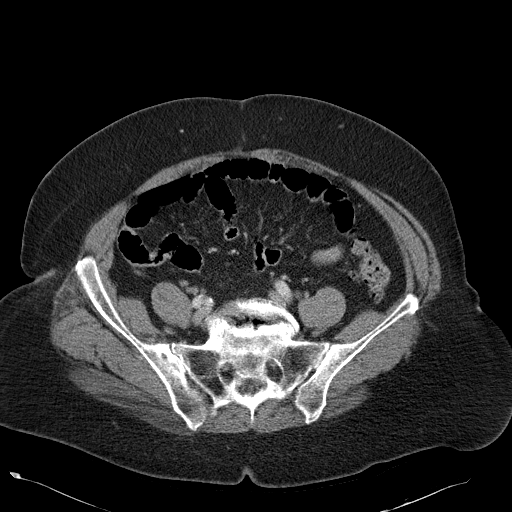
[im 34/88  soft-tissue]
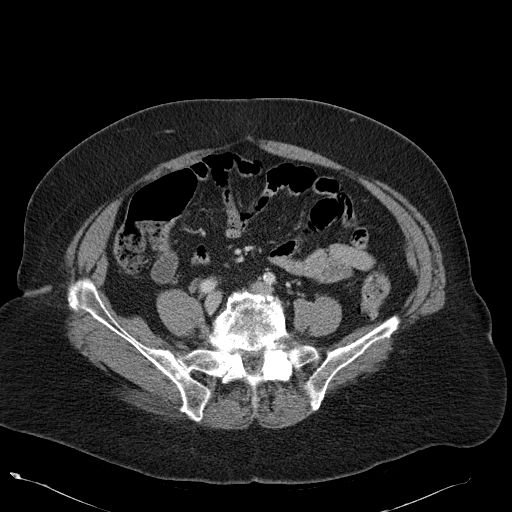
[im 39/88  soft-tissue]
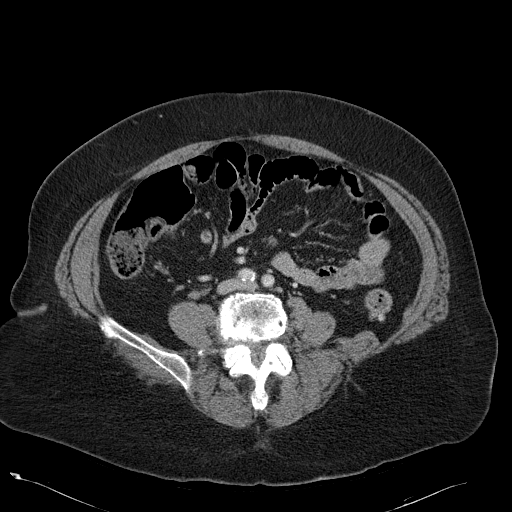
[im 49/88  soft-tissue]
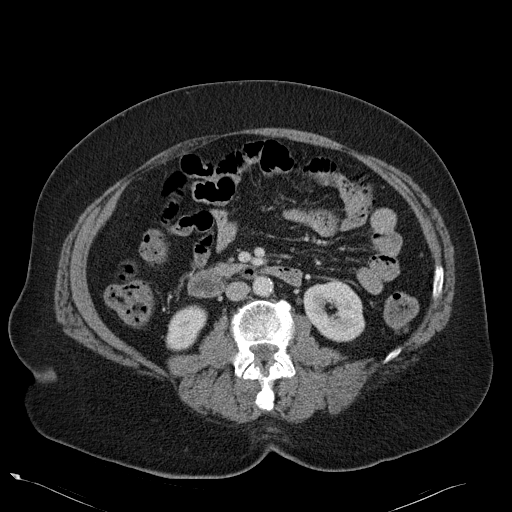
[im 54/88  soft-tissue]
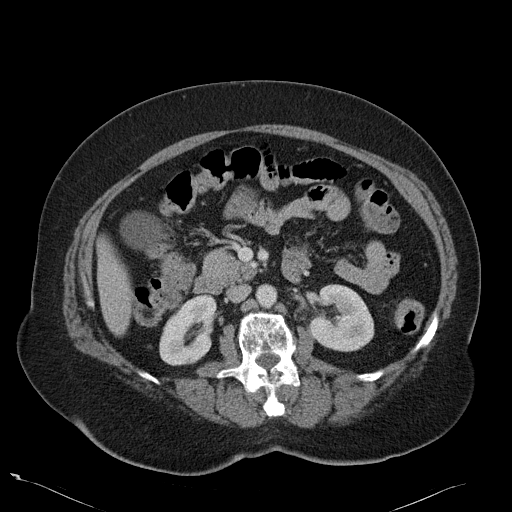
[im 54/88  bone]
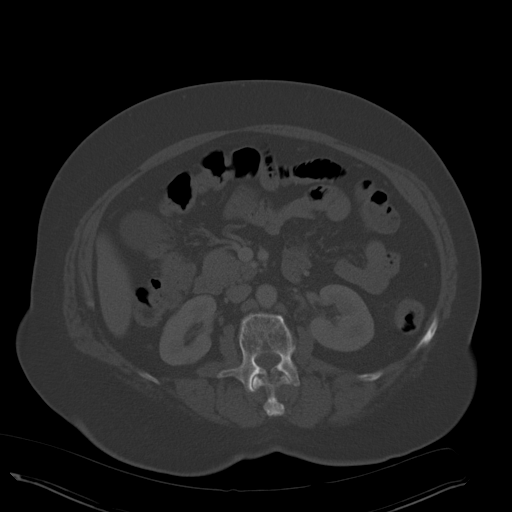
[im 59/88  soft-tissue]
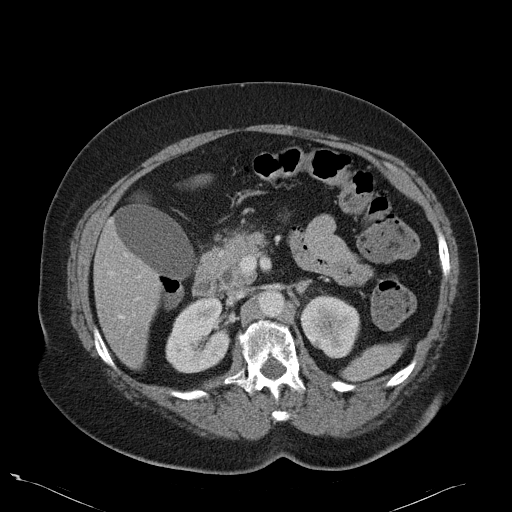
[im 63/88  soft-tissue]
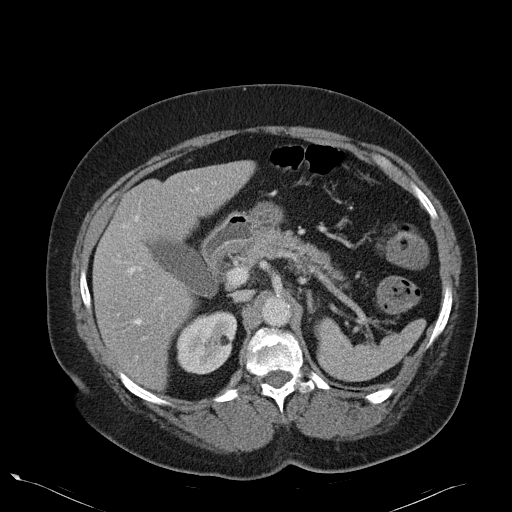
[im 68/88  soft-tissue]
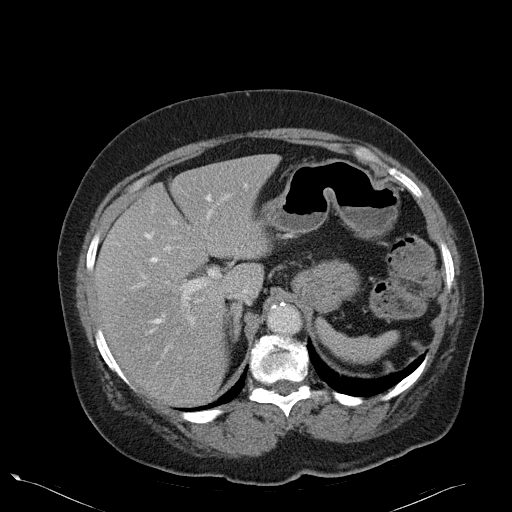
[im 78/88  soft-tissue]
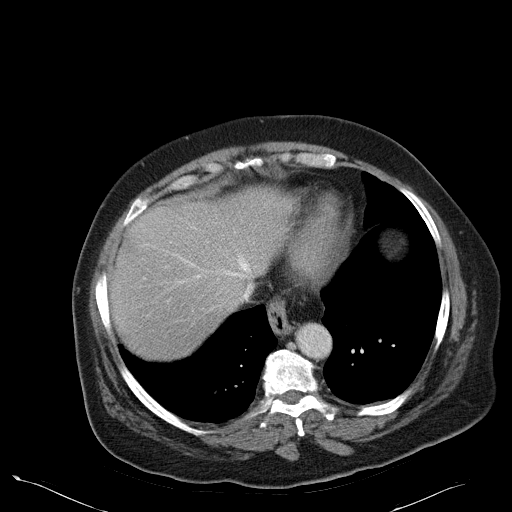
[im 83/88  soft-tissue]
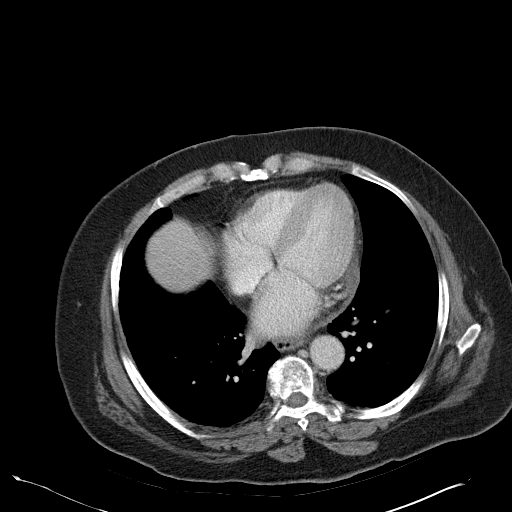

[Series 4: abd_pel_with 3.0 spo cor · coronal · 0.77mm/px · 3 of 114 slices shown]
[im 38/114  soft-tissue]
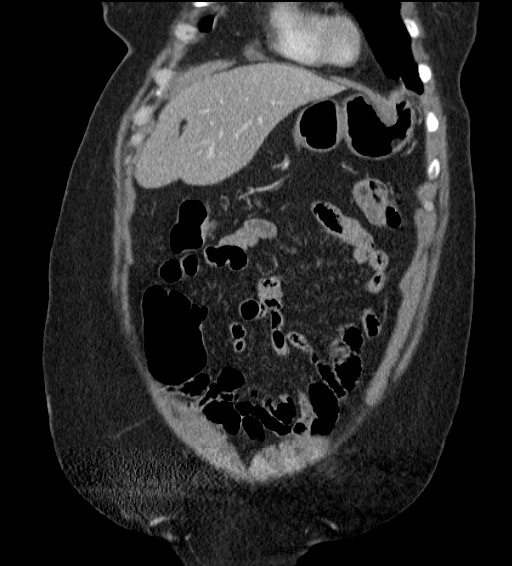
[im 51/114  soft-tissue]
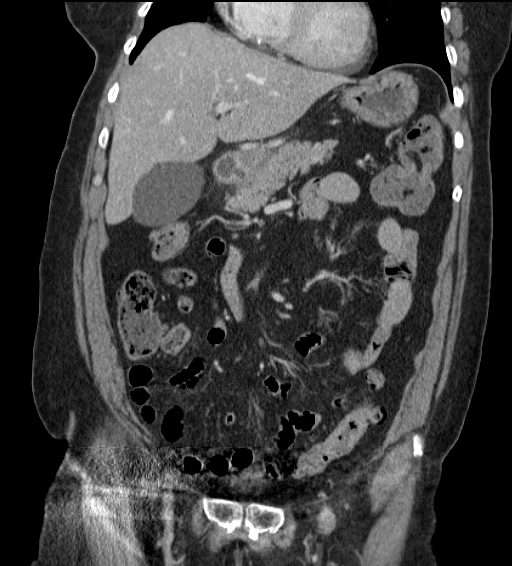
[im 63/114  soft-tissue]
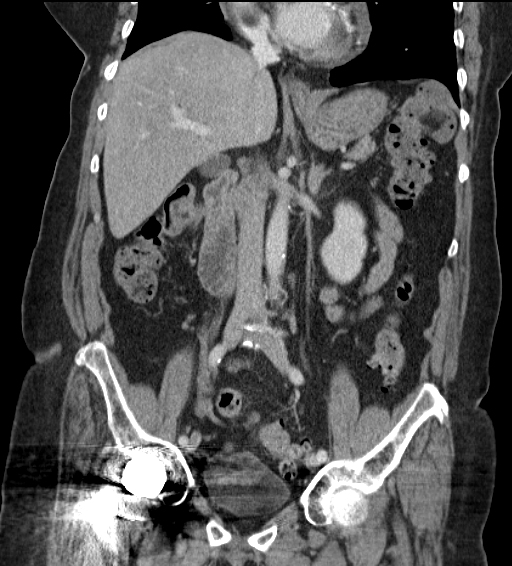

[17 of 46 positions shown; findings below may reference images not displayed]

FINDINGS: Minimal dependent atelectasis lung bases.

Liver, spleen and adrenal glands within normal limits.

Mild soft tissue stranding anterior to the pancreatic head (series
2/image 30).

Gallbladder is unremarkable. No intrahepatic ductal dilatation.
Common bile duct measures 8 mm.

Tiny bilateral renal cysts.  Right upper pole angiomyolipoma.

No evidence of bowel obstruction.  Normal appendix.  Colonic
diverticulosis, without associated inflammatory changes.  Moderate
stool in the rectum.

No abdominopelvic ascites.  No suspicious abdominopelvic
lymphadenopathy.

Uterus unremarkable.  No adnexal masses.

Bladder is within normal limits.

Degenerative changes of the visualized thoracolumbar spine. Right
hip arthroplasty.
IMPRESSION: Mild soft tissue stranding anterior to the pancreatic head,
correlate for pancreatitis.

Normal appendix.  No evidence of bowel obstruction.

Additional ancillary findings as above.

## 2012-11-02 ENCOUNTER — Non-Acute Institutional Stay (SKILLED_NURSING_FACILITY): Payer: Medicare HMO | Admitting: Adult Health

## 2012-11-02 DIAGNOSIS — I10 Essential (primary) hypertension: Secondary | ICD-10-CM

## 2012-11-02 DIAGNOSIS — G4700 Insomnia, unspecified: Secondary | ICD-10-CM

## 2012-11-02 DIAGNOSIS — R51 Headache: Secondary | ICD-10-CM

## 2012-11-02 DIAGNOSIS — R32 Unspecified urinary incontinence: Secondary | ICD-10-CM

## 2012-11-02 DIAGNOSIS — E559 Vitamin D deficiency, unspecified: Secondary | ICD-10-CM

## 2012-11-02 DIAGNOSIS — E119 Type 2 diabetes mellitus without complications: Secondary | ICD-10-CM

## 2012-11-02 DIAGNOSIS — K2210 Ulcer of esophagus without bleeding: Secondary | ICD-10-CM

## 2012-11-02 DIAGNOSIS — K208 Other esophagitis without bleeding: Secondary | ICD-10-CM

## 2012-11-02 DIAGNOSIS — F411 Generalized anxiety disorder: Secondary | ICD-10-CM

## 2012-11-02 DIAGNOSIS — K316 Fistula of stomach and duodenum: Secondary | ICD-10-CM

## 2012-11-17 ENCOUNTER — Other Ambulatory Visit: Payer: Self-pay | Admitting: *Deleted

## 2012-11-17 ENCOUNTER — Encounter: Payer: Self-pay | Admitting: Adult Health

## 2012-11-17 DIAGNOSIS — G4700 Insomnia, unspecified: Secondary | ICD-10-CM | POA: Insufficient documentation

## 2012-11-17 DIAGNOSIS — R51 Headache: Secondary | ICD-10-CM | POA: Insufficient documentation

## 2012-11-17 DIAGNOSIS — F411 Generalized anxiety disorder: Secondary | ICD-10-CM | POA: Insufficient documentation

## 2012-11-17 DIAGNOSIS — E559 Vitamin D deficiency, unspecified: Secondary | ICD-10-CM | POA: Insufficient documentation

## 2012-11-17 DIAGNOSIS — R519 Headache, unspecified: Secondary | ICD-10-CM | POA: Insufficient documentation

## 2012-11-17 DIAGNOSIS — R32 Unspecified urinary incontinence: Secondary | ICD-10-CM | POA: Insufficient documentation

## 2012-11-17 MED ORDER — MORPHINE SULFATE 20 MG/5ML PO SOLN: ORAL | Status: DC

## 2012-11-17 NOTE — Progress Notes (Signed)
Patient ID: [name], female   DOB: 1932-04-17, 79 y.o.   MRN: [id number]  FACILITY: [facility]  Allergies  Allergen Reactions  . Other Swelling    Pinto beans cause swelling and hives  . Penicillins Swelling  . Fentanyl     rash     Chief Complaint  Patient presents with  . Medical Managment of Chronic Issues    HPI:  She is being seen for the management of her chronic issues; she continues to decline; she is losing weight despite the interventions put in place; she continues to have abdominal pain; nausea; and anxiety; I have spoken with the staff who it is time to call in hospice care for her   Past Medical History  Diagnosis Date  . Hypertension   . Reflux   . Right elbow pain     OTIF  . Dementia   . Depression   . Osteoarthritis   . Pancreatitis 11/2007    [facility]OP  . Angiomyolipoma of kidney     right  . Ulcerative esophagitis 12/05/2007    hx elevated gastrin, severe on EGD by Dr Jena Gauss , h pylori negative  . Hiatal hernia   . S/P colonoscopy 2009    pt reports normal by Dr Lovell Sheehan  . Diabetes mellitus   . Anemia   . Hyponatremia   . Cellulitis   . Esophagitis   . Peptic ulcer   . Insomnia   . Sepsis(995.91)   . Respiratory failure   . Sepsis(995.91)   . Clostridium difficile infection   . Esophagitis   . Depression   . Encephalitis   . Urinary tract infection   . Bacteremia   . 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 Perforated prepyloric gastric ulcer s/p antrectomy with Billroth II reconstruction 01/30/2011    Past Surgical History  Procedure Laterality Date  . Orif right hip  1999    [facility]  . Umbilical hernia repair  13 months old    [place]  . Esophagogastroduodenoscopy  01/28/2011    Procedure: ESOPHAGOGASTRODUODENOSCOPY (EGD);  Surgeon: Arlyce Harman, MD;  Location: [facility];  Service: Endoscopy;  Laterality: N/A;  . Colonoscopy  01/28/2011    Procedure: COLONOSCOPY;  Surgeon: Arlyce Harman, MD;  Location: [facility];  Service: Endoscopy;  Laterality: N/A;  . Laparotomy  02/04/2011    Procedure: EXPLORATORY LAPAROTOMY;  Surgeon: Fabio Bering;  Location: [facility] ORS;  Service: General;  Laterality: N/A;  . Laparotomy  05/07/2011    Procedure: EXPLORATORY LAPAROTOMY;  Surgeon: Ardeth Sportsman, MD;  Location: [facility] OR;  Service: General;  Laterality: N/A;  lysis of adhesions  . Gastrostomy  05/07/2011    Procedure: GASTROSTOMY;  Surgeon: Ardeth Sportsman, MD;  Location: [facility] OR;  Service: General;  Laterality: N/A;  g-tube / j -tube placement  . Gastrectomy  05/07/2011    Procedure: GASTRECTOMY;  Surgeon: Ardeth Sportsman, MD;  Location: [facility] OR;  Service: General;  Laterality: N/A;  Partial gastrectomy  . Cholecystectomy  05/07/2011    Procedure: CHOLECYSTECTOMY;  Surgeon: Ardeth Sportsman, MD;  Location: [facility] OR;  Service: General;  Laterality: N/A;  open  . Tee without cardioversion  10/21/2011    Procedure: TRANSESOPHAGEAL ECHOCARDIOGRAM (TEE);  Surgeon: Wendall Stade, MD;  Location: [facility] ENDOSCOPY;  Service: Cardiovascular;  Laterality: N/A;    VITAL SIGNS BP 112/74  Pulse 71  Ht 5\' 6"  (1.676 m)  Wt 117  lb (53.071 kg)  BMI 18.89 kg/m2   Patient's Medications  New Prescriptions   No medications on file  Previous Medications   AMLODIPINE (NORVASC) 10 MG TABLET    Take 10 mg by mouth daily.    BUTALBITAL-ACETAMINOPHEN-CAFFEINE (FIORICET, ESGIC) 50-325-40 MG PER TABLET    Take 2 tablets by mouth every 8 (eight) hours as needed. For headache   CHOLECALCIFEROL (VITAMIN D3) 50000 UNITS CAPS    Take 1 capsule by mouth every 7 (seven) days. Take on Thursday   CLONIDINE (CATAPRES) 0.1 MG TABLET    Take 0.1 mg by mouth daily.   DARIFENACIN (ENABLEX) 15 MG 24 HR TABLET    Take 15 mg by mouth daily.    INSULIN ASPART (NOVOLOG) 100 UNIT/ML INJECTION    Inject 5 Units into the skin 3 (three) times daily as needed. For cbg >150 take 5 units 15 minutes before or 30 minutes after meal   LORAZEPAM  (ATIVAN) 0.5 MG TABLET    Take 0.5 mg by mouth every 6 (six) hours as needed for anxiety.   METOPROLOL (LOPRESSOR) 50 MG TABLET    Take 50 mg by mouth 2 (two) times daily.    MORPHINE 20 MG/5ML SOLUTION    Take 0.25-0.5 ML every 2 hours as needed for pain   ONDANSETRON (ZOFRAN) 4 MG/5ML SOLUTION    Place 4 mg into feeding tube every 8 (eight) hours as needed. For nausea   PANTOPRAZOLE (PROTONIX) 40 MG TABLET    Take 40 mg by mouth daily.    SERTRALINE (ZOLOFT) 100 MG TABLET    Take 200 mg by mouth daily.   SERTRALINE (ZOLOFT) 25 MG TABLET    Take 25 mg by mouth daily. Increase dose to 50mg  daily to start 08/26/11.   ZOLPIDEM (AMBIEN) 10 MG TABLET    Take 10 mg by mouth at bedtime.  Modified Medications   No medications on file  Discontinued Medications   No medications on file    SIGNIFICANT DIAGNOSTIC EXAMS   12/21/11:  cdiff pcr positive 12/25/11:wbc 5.1; hgb 9.7; hct 29.8; plt 243; glucose 108; bun 6; create 0.52; k+ 2.4; na++ 134; mag 1.3  01-04-12: wbc 3.9; hgb 9.7; hct 30.8; mcv 79.8; plt 235; glucose 166; bun 15; creat 0.53; k+ 3.9; na++ 132 liver normal;  albumin 3.4; phos 2.2; mag 1.4; pre-albumin 15.7; vit d <10  01-18-12; wbc 6.1;hgb 9.7; hct 30.0; mcv 75.6 plt 461;glucose 121; bun 8; creat 0.61; k+ 3.6; na++136;  liver normal albumin 3.3; phos 3.2 mag 1.6; pre-albumin 16.3; vit d 10 01-25-12: wbc 6.1; hgb 9.7; hct 29.9; mcv 75.7; plt 417; glucose 126; bun 13; creat 0.70; k+ 4.2; na++ 134 alk phos 127; albumin 3.5; phos 4.2 mag 1.8; pre-albumin 18.3; vit d 10  02-10-12: glucose 97; bun 13; creat 0.68; k+ 4.8; na++ 134; liver normal albumin 3.1  04-22-12:  vit D <10 06-01-12: wbc 5.9; hgb 10.7; hct 33.1; mcv 75.9 ;plt 422  07-01-12: glucose 101; bun 9; creat 0.48; k+ 4.3; na++ 137; alk phos 142; albumin 2.4; ca++ 8.0 hgb a1c 5.7  07-13-12: vit d 10  09-19-12: wbc 3.4; hgb 11.4; hct 34.5 ;mcv 87.1 ;plt 383     Review of Systems  Constitutional: Positive for malaise/fatigue.   Respiratory: Negative for cough and shortness of breath.   Cardiovascular: Negative for chest pain.  Gastrointestinal: Positive for nausea and abdominal pain. Negative for diarrhea and constipation.  Musculoskeletal: Positive for back pain. Negative for myalgias.  Skin: Negative.   Neurological: Positive for headaches.  Psychiatric/Behavioral: Negative for depression. The patient is nervous/anxious. The patient does not have insomnia.     Physical Exam  Constitutional:  Frail   Neck: Neck supple. No thyromegaly present.  Cardiovascular: Normal rate, regular rhythm and intact distal pulses.   Respiratory: Effort normal and breath sounds normal.  GI: Soft. Bowel sounds are normal. There is tenderness.  Has diffuse tenderness; has peg tube present  Musculoskeletal: Normal range of motion. She exhibits no edema.  Neurological: She is alert.  Oriented to self  Skin: Skin is warm and dry.  Psychiatric: She has a normal mood and affect.       ASSESSMENT/ PLAN:  DIABETES MELLITUS, TYPE II Is stable will continue novolog 5 units prior to meals for cbg >=150  Anxiety state, unspecified Is worse; will continue her zoloft at 200 mg daily will change her ativan from 0.5 mg every 6 hours as needed to three times daily on a routine basis.   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

## 2012-11-17 NOTE — Assessment & Plan Note (Signed)
Will continue norvasc at 10 mg daily will continue lopressor 50 mg twice daily and will continue clonidine 0.1 mg daily

## 2012-11-17 NOTE — Assessment & Plan Note (Signed)
Is stable will continue novolog 5 units prior to meals for cbg >=150

## 2012-11-17 NOTE — Assessment & Plan Note (Signed)
Will continue enablex 15 mg daily

## 2012-11-17 NOTE — Assessment & Plan Note (Signed)
For her headaches will continue her Fioricet 2 tabs every 8 hours as needed

## 2012-11-17 NOTE — Assessment & Plan Note (Signed)
Will continue ambien 10 mg daily

## 2012-11-17 NOTE — Assessment & Plan Note (Signed)
Will continue her protonix at 40 mg daily

## 2012-11-17 NOTE — Assessment & Plan Note (Signed)
Is worse; will continue her zoloft at 200 mg daily will change her ativan from 0.5 mg every 6 hours as needed to three times daily on a routine basis.

## 2012-11-17 NOTE — Assessment & Plan Note (Signed)
She is continuing to decline she is losing weight despite being on tube feeding supplement; she continues to have abdominal pain which is not being adequately managed on her roxanol 5 or 10 mg every 2 hours as needed; and continues with episodes of nausea and is taking zofran 4 mg every 8 hours as needed; will change her roxanol to 5 mg every 6 hours on a routine basis and will keep her prn dosing; will setup a hospice consult for her at this time as she continues to decline.

## 2012-11-17 NOTE — Assessment & Plan Note (Signed)
Will continue her vit d at 50,000 units weekly

## 2012-12-22 ENCOUNTER — Non-Acute Institutional Stay (SKILLED_NURSING_FACILITY): Payer: Medicare HMO | Admitting: Internal Medicine

## 2012-12-22 DIAGNOSIS — K316 Fistula of stomach and duodenum: Secondary | ICD-10-CM

## 2012-12-22 DIAGNOSIS — F411 Generalized anxiety disorder: Secondary | ICD-10-CM

## 2012-12-22 DIAGNOSIS — G4700 Insomnia, unspecified: Secondary | ICD-10-CM

## 2012-12-22 DIAGNOSIS — I10 Essential (primary) hypertension: Secondary | ICD-10-CM

## 2012-12-22 DIAGNOSIS — E119 Type 2 diabetes mellitus without complications: Secondary | ICD-10-CM

## 2012-12-22 DIAGNOSIS — R32 Unspecified urinary incontinence: Secondary | ICD-10-CM

## 2012-12-22 DIAGNOSIS — R51 Headache: Secondary | ICD-10-CM

## 2012-12-22 NOTE — Progress Notes (Signed)
Patient ID: [name], female   DOB: August 04, 1931, 79 y.o.   MRN: [id number]  Code Status: DNR  Allergies  Allergen Reactions  . Other Swelling    Pinto beans cause swelling and hives  . Penicillins Swelling    Chief Complaint  Patient presents with  . Medical Managment of Chronic Issues    HPI:  79 y/o female patient is a LTC resident of the facility. She was seen in her room today. Her weight remains stable, appetite is good. g tube site has healed well. She is followed by palliative care services. Her sugar has remained stable. She tumbled this am and fell and hit side of her brows. She has a small bruise ad tear in that area. Denies any pain but has soreness. She is going to have her lunch now  Review of Systems:  Constitutional: negative for malaise and fatigue Respiratory: Negative for cough and shortness of breath.   Cardiovascular: Negative for chest pain.  Gastrointestinal: Negative for diarrhea, nausea, vomiting, abdominal pain and constipation.  Musculoskeletal: Positive for back pain. Negative for myalgias.  Skin: Negative.   Neurological: Positive for headaches.  Psychiatric/Behavioral: Negative for depression. The patient does not have insomnia.    Allergies  Allergen Reactions  . Other Swelling    Pinto beans cause swelling and hives  . Penicillins Swelling    Past Medical History  Diagnosis Date  . Hypertension   . Reflux   . Right elbow pain     OTIF  . Dementia   . Depression   . Osteoarthritis   . Pancreatitis 11/2007    HOP  . Angiomyolipoma of kidney     right  . Ulcerative esophagitis 12/05/2007    hx elevated gastrin, severe on EGD by Dr Jena Gauss , h pylori negative  . Hiatal hernia   . S/P colonoscopy 2009    pt reports normal by Dr Lovell Sheehan  . Diabetes mellitus   . Anemia   . Hyponatremia   . Cellulitis   . Esophagitis   . Peptic ulcer   . Insomnia   . Sepsis(995.91)   . Respiratory failure   . Sepsis(995.91)   . Clostridium difficile  infection   . Esophagitis   . Depression   . Encephalitis   . Urinary tract infection   . Bacteremia   . 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 Perforated prepyloric gastric ulcer s/p antrectomy with Billroth II reconstruction 01/30/2011   Past Surgical History  Procedure Laterality Date  . Orif right hip  1999    [facility]  . Umbilical hernia repair  27 months old    [place]  . Esophagogastroduodenoscopy  01/28/2011    Procedure: ESOPHAGOGASTRODUODENOSCOPY (EGD);  Surgeon: Arlyce Harman, MD;  [facility]: [facility];  Service: Endoscopy;  Laterality: N/A;  . Colonoscopy  01/28/2011    Procedure: COLONOSCOPY;  Surgeon: Arlyce Harman, MD;  [facility]: [facility];  Service: Endoscopy;  Laterality: N/A;  . Laparotomy  02/04/2011    Procedure: EXPLORATORY LAPAROTOMY;  Surgeon: Fabio Bering;  Location: [facility];  Service: General;  Laterality: N/A;  . Laparotomy  05/07/2011    Procedure: EXPLORATORY LAPAROTOMY;  Surgeon: Ardeth Sportsman, MD;  Location: [facility] OR;  Service: General;  Laterality: N/A;  lysis of adhesions  . Gastrostomy  05/07/2011    Procedure: GASTROSTOMY;  Surgeon: Ardeth Sportsman, MD;  Location: [facility] OR;  Service: General;  Laterality: N/A;  g-tube / j -  tube placement  . Gastrectomy  05/07/2011    Procedure: GASTRECTOMY;  Surgeon: Ardeth Sportsman, MD;  Location: [facility] OR;  Service: General;  Laterality: N/A;  Partial gastrectomy  . Cholecystectomy  05/07/2011    Procedure: CHOLECYSTECTOMY;  Surgeon: Ardeth Sportsman, MD;  Location: [facility] OR;  Service: General;  Laterality: N/A;  open  . Tee without cardioversion  10/21/2011    Procedure: TRANSESOPHAGEAL ECHOCARDIOGRAM (TEE);  Surgeon: Wendall Stade, MD;  Location: [facility] ENDOSCOPY;  Service: Cardiovascular;  Laterality: N/A;   Social History:   reports that she has never smoked. Her smokeless tobacco use includes Chew. She reports that she does not drink alcohol or use illicit drugs.  Family History  Problem  Relation Age of Onset  . Cancer Mother     pelvic     Medications: Patient's Medications  New Prescriptions   No medications on file  Previous Medications   ACETAMINOPHEN (TYLENOL) 500 MG TABLET    Take 500 mg by mouth every 6 (six) hours as needed for pain or fever.   AMLODIPINE (NORVASC) 10 MG TABLET    Take 10 mg by mouth daily.    BUTALBITAL-ACETAMINOPHEN-CAFFEINE (FIORICET, ESGIC) 50-325-40 MG PER TABLET    Take 2 tablets by mouth every 8 (eight) hours as needed. For headache   CHOLECALCIFEROL (VITAMIN D3) 50000 UNITS CAPS    Take 1 capsule by mouth every 7 (seven) days. Take on Thursday   CLONIDINE (CATAPRES) 0.1 MG TABLET    Take 0.1 mg by mouth daily.   DARIFENACIN (ENABLEX) 15 MG 24 HR TABLET    Take 15 mg by mouth daily.    INSULIN ASPART (NOVOLOG) 100 UNIT/ML INJECTION    Inject 5 Units into the skin 3 (three) times daily as needed. For cbg >150 take 5 units 15 minutes before or 30 minutes after meal   LORAZEPAM (ATIVAN) 0.5 MG TABLET    Take 0.5 mg by mouth every 8 (eight) hours.    METOPROLOL (LOPRESSOR) 50 MG TABLET    Take 50 mg by mouth 2 (two) times daily.    OLOPATADINE HCL (PATADAY) 0.2 % SOLN    Apply 1 drop to eye daily. Both eyes   OMEPRAZOLE (PRILOSEC) 20 MG CAPSULE    Take 20 mg by mouth 2 (two) times daily.   ONDANSETRON (ZOFRAN) 4 MG/5ML SOLUTION    Place 4 mg into feeding tube every 8 (eight) hours as needed. For nausea   SERTRALINE (ZOLOFT) 100 MG TABLET    Take 100 mg by mouth daily.    ZOLPIDEM (AMBIEN) 10 MG TABLET    Take 10 mg by mouth at bedtime.  Modified Medications   Modified Medication Previous Medication   MORPHINE 20 MG/5ML SOLUTION morphine 20 MG/5ML solution      Give 0.25ML(5 MG) every 2 hours as needed for pain scale 1-15, 0.5 ml (10 mg) every 2hours as needed for pain scale 6-10 and 0.25 ml (5 mg) every 6 hours routinely    Give 0.5ML(10 MG) every 2 hours as needed for pain scale 6-10  Discontinued Medications   MORPHINE 20 MG/5ML SOLUTION     Take 0.25-0.5 ML every 2 hours as needed for pain   PANTOPRAZOLE (PROTONIX) 40 MG TABLET    Take 40 mg by mouth daily.    SERTRALINE (ZOLOFT) 25 MG TABLET    Take 25 mg by mouth daily. Increase dose to 50mg  daily to start 08/26/11.    Physical Exam Filed Vitals:  12/22/12 1447  BP: 118/55  Pulse: 64  Temp: 97 F (36.1 C)  Resp: 20  Height: 5\' 6"  (1.676 m)  Weight: 115 lb (52.164 kg)  SpO2: 96%   Constitutional:  Frail, in no acute distress Neck: Neck supple. No thyromegaly present.  Cardiovascular: Normal rate, regular rhythm and intact distal pulses.   Respiratory: Effort normal and breath sounds normal.  GI: Soft. Bowel sounds are normal. Peg tube site healed well Musculoskeletal: Normal range of motion. She exhibits no edema.  Neurological: She is alert.  Oriented to self  Skin: Skin is warm and dry. A small tear in right lateral brow area. bruise present Psychiatric: She has a normal mood and affect.   Labs reviewed: Basic Metabolic Panel:  Recent Labs  09/81/19 0344  01/11/12 0510 01/13/12 0500 02/01/12 0018  NA 134*  < > 134* 137 132*  K 2.4*  < > 3.1* 2.8* 4.2  CL 102  < > 102 102 96  CO2 22  < > 23 25 25   GLUCOSE 108*  < > 106* 123* 121*  BUN 6  < > 8 7 9   CREATININE 0.52  < > 0.49* 0.51 0.70  CALCIUM 8.2*  < > 8.7 8.5 9.2  MG 1.3*  --   --   --   --   < > = values in this interval not displayed. Liver Function Tests:  Recent Labs  01/08/12 2305 02/01/12 0018  AST 27 24  ALT 22 21  ALKPHOS 167* 147*  BILITOT 0.2* 0.1*  PROT 7.4 7.6  ALBUMIN 3.0* 3.1*   CBC:  Recent Labs  01/08/12 2305  01/11/12 0510 01/13/12 0500 02/01/12 0018  WBC 3.9*  < > 3.8* 3.6* 6.5  NEUTROABS 3.4  --   --   --  3.1  HGB 10.4*  < > 8.6* 9.1* 10.2*  HCT 32.3*  < > 26.7* 28.6* 32.6*  MCV 79.6  < > 77.6* 76.9* 78.0  PLT 235  < > 266 325 342  < > = values in this interval not displayed.  CBG:  Recent Labs  01/14/12 2344 01/15/12 0407 02/01/12 0235  GLUCAP  90 104* 129*    SIGNIFICANT DIAGNOSTIC EXAMS   12/21/11:  cdiff pcr positive 12/25/11:wbc 5.1; hgb 9.7; hct 29.8; plt 243; glucose 108; bun 6; create 0.52; k+ 2.4; na++ 134; mag 1.3   01-04-12: wbc 3.9; hgb 9.7; hct 30.8; mcv 79.8; plt 235; glucose 166; bun 15; creat 0.53; k+ 3.9; na++ 132 liver normal;  albumin 3.4; phos 2.2; mag 1.4; pre-albumin 15.7; vit d <10   01-18-12; wbc 6.1;hgb 9.7; hct 30.0; mcv 75.6 plt 461;glucose 121; bun 8; creat 0.61; k+ 3.6; na++136;   liver normal albumin 3.3; phos 3.2 mag 1.6; pre-albumin 16.3; vit d 10 01-25-12: wbc 6.1; hgb 9.7; hct 29.9; mcv 75.7; plt 417; glucose 126; bun 13; creat 0.70; k+ 4.2; na++ 134 alk phos 127; albumin 3.5; phos 4.2 mag 1.8; pre-albumin 18.3; vit d 10   02-10-12: glucose 97; bun 13; creat 0.68; k+ 4.8; na++ 134; liver normal albumin 3.1  04-22-12:  vit D <10 06-01-12: wbc 5.9; hgb 10.7; hct 33.1; mcv 75.9 ;plt 422   07-01-12: glucose 101; bun 9; creat 0.48; k+ 4.3; na++ 137; alk phos 142; albumin 2.4; ca++ 8.0 hgb a1c 5.7   07-13-12: vit d 10   09-19-12: wbc 3.4; hgb 11.4; hct 34.5 ;mcv 87.1 ;plt 383   Assessment/Plan  DIABETES MELLITUS, TYPE II Is  stable with novolog 5 units prior to meals for cbg >=150. Off peg tube feed and eating well. Will check a1c and if < 7, will stop novolog SSI  HYPERTENSION Will continue norvasc 10 mg daily, lopressor 50 mg twice daily and clonidine 0.1 mg daily. 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 Continue morphine 0.25 ml to 0.5 ml prn for pain. Will not change current regimen  Urinary incontinence Will continue enablex 15 mg daily   Anxiety state, unspecified Stable currently, continue standing regimen of ativan and sertraline  Esophagitis, erosive Will continue her omeprazole  Unspecified vitamin D deficiency Will continue her vit d at 50,000 units weekly  Headache Under control, less frequent now, continue her Fioricet 2 tabs  every 8 hours as needed   Insomnia Will continue ambien 10 mg daily    Labs/tests ordered- cbc, cmp, a1c

## 2013-03-01 ENCOUNTER — Non-Acute Institutional Stay (SKILLED_NURSING_FACILITY): Payer: Medicare HMO | Admitting: Adult Health

## 2013-03-01 ENCOUNTER — Encounter: Payer: Self-pay | Admitting: Adult Health

## 2013-03-01 DIAGNOSIS — K2210 Ulcer of esophagus without bleeding: Secondary | ICD-10-CM

## 2013-03-01 DIAGNOSIS — K316 Fistula of stomach and duodenum: Secondary | ICD-10-CM

## 2013-03-01 DIAGNOSIS — I10 Essential (primary) hypertension: Secondary | ICD-10-CM

## 2013-03-01 DIAGNOSIS — R51 Headache: Secondary | ICD-10-CM

## 2013-03-01 DIAGNOSIS — F411 Generalized anxiety disorder: Secondary | ICD-10-CM

## 2013-03-01 DIAGNOSIS — E119 Type 2 diabetes mellitus without complications: Secondary | ICD-10-CM

## 2013-03-01 DIAGNOSIS — R32 Unspecified urinary incontinence: Secondary | ICD-10-CM

## 2013-03-01 DIAGNOSIS — K208 Other esophagitis without bleeding: Secondary | ICD-10-CM

## 2013-03-01 DIAGNOSIS — E559 Vitamin D deficiency, unspecified: Secondary | ICD-10-CM

## 2013-03-01 NOTE — Assessment & Plan Note (Signed)
Her peg tube has been removed; she is tolerating po diet without difficulty. Her pain is being managed with roxanol 20 mg every 6 hours; and 5 or 10 mg every 2 hours as needed; will not make changes and will continue to monitor her status

## 2013-03-01 NOTE — Assessment & Plan Note (Signed)
She is stable will continue prilosec 20 mg twice daily and will monitor

## 2013-03-01 NOTE — Assessment & Plan Note (Signed)
Will continue her vitamin d 50,000 units weekly and will monitor

## 2013-03-01 NOTE — Assessment & Plan Note (Signed)
Her anxiety is being well managed with zoloft 100 daily and ativan gel 0.5 mg 4 times daily and will monitor her status

## 2013-03-01 NOTE — Assessment & Plan Note (Signed)
She is stable will continue novolog 5 units prior to meals for cbg >=150 and will monitor her status

## 2013-03-01 NOTE — Assessment & Plan Note (Signed)
Is stable will continue enablex 15 mg daily

## 2013-03-01 NOTE — Progress Notes (Signed)
Patient ID: [name], female   DOB: [birth year], 79 y.o.   MRN: [id number]  [facility] [employer]  Allergies  Allergen Reactions  . Other Swelling    Pinto beans cause swelling and hives  . Penicillins Swelling     Chief Complaint  Patient presents with  . Medical Managment of Chronic Issues    HPI: She is being seen for the management of her chronic illnesses. Her peg tube came out since the last time I saw her; her family had opted not to have the peg tube reinserted. She is not voicing any complaints is eating and tolerating her meals without difficulty at this time. There are no concerns being voiced by the nursing staff.   Past Medical History  Diagnosis Date  . Hypertension   . Reflux   . Right elbow pain     OTIF  . Dementia   . Depression   . Osteoarthritis   . Pancreatitis 11/2007    [facility]  . Angiomyolipoma of kidney     right  . Ulcerative esophagitis 12/05/2007    hx elevated gastrin, severe on EGD by Dr [name] , h pylori negative  . Hiatal hernia   . S/P colonoscopy 2009    pt reports normal by Dr [name]  . Diabetes mellitus   . Anemia   . Hyponatremia   . Cellulitis   . Esophagitis   . Peptic ulcer   . Insomnia   . Sepsis(995.91)   . Respiratory failure   . Sepsis(995.91)   . Clostridium difficile infection   . Esophagitis   . Depression   . Encephalitis   . Urinary tract infection   . Bacteremia   . 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 Perforated prepyloric gastric ulcer s/p antrectomy with Billroth II reconstruction 01/30/2011    Past Surgical History  Procedure Laterality Date  . Orif right hip  1999    [facility]  . Umbilical hernia repair  25 months old    [place]  . Esophagogastroduodenoscopy  01/28/2011    Procedure: ESOPHAGOGASTRODUODENOSCOPY (EGD);  Surgeon: [name] Harman, MD;  [facility];  Service: Endoscopy;  Laterality: N/A;  . Colonoscopy  01/28/2011    Procedure: COLONOSCOPY;   Surgeon: [name] Harman, MD;  [facility]: [facility];  Service: Endoscopy;  Laterality: N/A;  . Laparotomy  02/04/2011    Procedure: EXPLORATORY LAPAROTOMY;  Surgeon: [name];  Location: [facility];  Service: General;  Laterality: N/A;  . Laparotomy  05/07/2011    Procedure: EXPLORATORY LAPAROTOMY;  Surgeon: [name] Sportsman, MD;  Location: [facility] OR;  Service: General;  Laterality: N/A;  lysis of adhesions  . Gastrostomy  05/07/2011    Procedure: GASTROSTOMY;  Surgeon: [name] Sportsman, MD;  Location: [facility] OR;  Service: General;  Laterality: N/A;  g-tube / j -tube placement  . Gastrectomy  05/07/2011    Procedure: GASTRECTOMY;  Surgeon: [name] Sportsman, MD;  Location[facility] OR;  Service: General;  Laterality: N/A;  Partial gastrectomy  . Cholecystectomy  05/07/2011    Procedure: CHOLECYSTECTOMY;  Surgeon: [name] Sportsman, MD;  Location: [facility] OR;  Service: General;  Laterality: N/A;  open  . Tee without cardioversion  10/21/2011    Procedure: TRANSESOPHAGEAL ECHOCARDIOGRAM (TEE);  Surgeon: [name] Stade, MD;  Location: [facility] ENDOSCOPY;  Service: Cardiovascular;  Laterality: N/A;    VITAL SIGNS BP 139/71  Pulse 75  Ht 5\' 6"  (1.676 m)  Wt 118  lb (53.524 kg)  BMI 19.05 kg/m2   Patient's Medications  New Prescriptions   No medications on file  Previous Medications   ACETAMINOPHEN (TYLENOL) 500 MG TABLET    Take 500 mg by mouth every 6 (six) hours as needed for pain or fever.   AMLODIPINE (NORVASC) 10 MG TABLET    Take 10 mg by mouth daily.    BUTALBITAL-ACETAMINOPHEN-CAFFEINE (FIORICET, ESGIC) 50-325-40 MG PER TABLET    Take 2 tablets by mouth every 8 (eight) hours as needed. For headache   CHOLECALCIFEROL (VITAMIN D3) 50000 UNITS CAPS    Take 1 capsule by mouth every 7 (seven) days. Take on Thursday   CLONIDINE (CATAPRES) 0.1 MG TABLET    Take 0.1 mg by mouth daily.   DARIFENACIN (ENABLEX) 15 MG 24 HR TABLET    Take 15 mg by mouth daily.    INSULIN ASPART (NOVOLOG) 100 UNIT/ML INJECTION    Inject 5  Units into the skin 3 (three) times daily as needed. For cbg >150 take 5 units 15 minutes before or 30 minutes after meal   LORAZEPAM (ATIVAN) 0.5 MG TABLET    Take 0.5 mg by mouth 4 (four) times daily.    METOPROLOL (LOPRESSOR) 50 MG TABLET    Take 50 mg by mouth 2 (two) times daily.    MORPHINE 20 MG/5ML SOLUTION    Take 10 mg by mouth every 2 (two) hours as needed. Give 20 mg every 6 hours ROUTINELY and 5 or 10 mg every 2 hours as needed for pain or distress   OLOPATADINE HCL (PATADAY) 0.2 % SOLN    Apply 1 drop to eye daily. Both eyes   OMEPRAZOLE (PRILOSEC) 20 MG CAPSULE    Take 20 mg by mouth 2 (two) times daily.   SERTRALINE (ZOLOFT) 100 MG TABLET    Take 100 mg by mouth daily.   Modified Medications   No medications on file  Discontinued Medications   ONDANSETRON (ZOFRAN) 4 MG/5ML SOLUTION    Place 4 mg into feeding tube every 8 (eight) hours as needed. For nausea   ZOLPIDEM (AMBIEN) 10 MG TABLET    Take 10 mg by mouth at bedtime.    SIGNIFICANT DIAGNOSTIC EXAMS  12-23-12: right knee x-ray: mild osteoarthritis of right knee 12-23-12: right femur: no acute osseous findings.   LABS REVIEWED:  04-22-12:  vit D <10 06-01-12: wbc 5.9; hgb 10.7; hct 33.1; mcv 75.9 ;plt 422  07-01-12: glucose 101; bun 9; creat 0.48; k+ 4.3; na++ 137; alk phos 142; albumin 2.4; ca++ 8.0 hgb a1c 5.7  07-13-12: vit d 10  09-19-12: wbc 3.4; hgb 11.4; hct 34.5 ;mcv 87.1 ;plt 383  12-23-12:wbc 3.6; hgb 10.6; hct 32.3;mcv 85.4 plt 227; glucose 73; bun 8; creat 0.50; k+3.4;na++139 Liver normal albumin 2.6; hgb a1c 5.4    Review of Systems  Constitutional: Negative for malaise/fatigue.  Respiratory: Negative for cough and shortness of breath.   Cardiovascular: Negative for chest pain and palpitations.  Gastrointestinal: Negative for heartburn, nausea, abdominal pain and constipation.  Musculoskeletal: Negative for myalgias.  Skin: Negative.   Neurological: Negative for headaches.  Psychiatric/Behavioral:  Negative for depression. The patient is not nervous/anxious.     Physical Exam  Constitutional:  thin  Neck: Neck supple. No JVD present. No thyromegaly present.  Cardiovascular: Normal rate, regular rhythm and intact distal pulses.   Respiratory: Effort normal and breath sounds normal. No respiratory distress. She has no wheezes.  GI: Soft. Bowel sounds are normal. She  exhibits no distension. There is no tenderness.  Musculoskeletal: Normal range of motion. She exhibits no edema.  Neurological: She is alert.  Skin: Skin is warm and dry.  Psychiatric: She has a normal mood and affect.      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 Her pain is being managed with roxanol 20 mg every 6 hours; and 5 or 10 mg every 2 hours as needed; will not make changes and will continue to monitor her status   Unspecified vitamin D deficiency Will continue her vitamin d 50,000 units weekly and will monitor   Urinary incontinence Is stable will continue enablex 15 mg daily   DIABETES MELLITUS, TYPE II She is stable will continue novolog 5 units prior to meals for cbg >=150 and will monitor her status   Anxiety state, unspecified Her anxiety is being well managed with zoloft 100 daily and ativan gel 0.5 mg 4 times daily and will monitor her status   Headache(784.0) Her headaches are being managed with fioricet 2 tabs every 8 hours as needed will not make changes.     Will check bmp next draw   Time spent with patient 50 minutes.

## 2013-03-01 NOTE — Assessment & Plan Note (Addendum)
She is stable will continue norvasc 10 mg daily; clonidine 0.1 mg daily; lopressor 50 mg twice daily  and will monitor

## 2013-03-01 NOTE — Assessment & Plan Note (Signed)
Her headaches are being managed with fioricet 2 tabs every 8 hours as needed will not make changes.

## 2013-03-24 ENCOUNTER — Other Ambulatory Visit: Payer: Self-pay | Admitting: *Deleted

## 2013-03-24 ENCOUNTER — Other Ambulatory Visit: Payer: Self-pay

## 2013-03-24 MED ORDER — MORPHINE SULFATE 20 MG/5ML PO SOLN: ORAL | Status: DC

## 2013-03-29 ENCOUNTER — Non-Acute Institutional Stay (SKILLED_NURSING_FACILITY): Payer: Medicare HMO | Admitting: Adult Health

## 2013-03-29 DIAGNOSIS — E559 Vitamin D deficiency, unspecified: Secondary | ICD-10-CM

## 2013-03-29 DIAGNOSIS — F411 Generalized anxiety disorder: Secondary | ICD-10-CM

## 2013-03-29 DIAGNOSIS — K316 Fistula of stomach and duodenum: Secondary | ICD-10-CM

## 2013-03-29 DIAGNOSIS — R32 Unspecified urinary incontinence: Secondary | ICD-10-CM

## 2013-03-29 DIAGNOSIS — E119 Type 2 diabetes mellitus without complications: Secondary | ICD-10-CM

## 2013-03-29 DIAGNOSIS — R51 Headache: Secondary | ICD-10-CM

## 2013-03-29 DIAGNOSIS — K2210 Ulcer of esophagus without bleeding: Secondary | ICD-10-CM

## 2013-03-29 DIAGNOSIS — I10 Essential (primary) hypertension: Secondary | ICD-10-CM

## 2013-03-30 ENCOUNTER — Encounter: Payer: Self-pay | Admitting: Adult Health

## 2013-03-30 MED ORDER — SERTRALINE HCL 100 MG PO TABS: 150.0000 mg | ORAL_TABLET | Freq: Every day | ORAL | Status: DC

## 2013-03-30 NOTE — Progress Notes (Signed)
Patient ID: [name], female   DOB: 10/15/1931, 79 y.o.   MRN: [id number]  [facility]  Allergies  Allergen Reactions  . Other Swelling    Pinto beans cause swelling and hives  . Penicillins Swelling     Chief Complaint  Patient presents with  . Medical Managment of Chronic Issues    HPI:  She is being seen for the management of her chronic illnesses. She is telling me and staff members that today is the last day of her life as she is losing weight. She does remain anxious. Her appetite is good per nursing staff and staff reports that she is sleeping at nightly. Her weight is presently at 109 pounds she has a poor ability to absorb nutrients and calories.   Past Medical History  Diagnosis Date  . Hypertension   . Reflux   . Right elbow pain     OTIF  . Dementia   . Depression   . Osteoarthritis   . Pancreatitis 11/2007    [facility]  . Angiomyolipoma of kidney     right  . Ulcerative esophagitis 12/05/2007    hx elevated gastrin, severe on EGD by Dr Jena Gauss , h pylori negative  . Hiatal hernia   . S/P colonoscopy 2009    pt reports normal by Dr Lovell Sheehan  . Diabetes mellitus   . Anemia   . Hyponatremia   . Cellulitis   . Esophagitis   . Peptic ulcer   . Insomnia   . Sepsis(995.91)   . Respiratory failure   . Sepsis(995.91)   . Clostridium difficile infection   . Esophagitis   . Depression   . Encephalitis   . Urinary tract infection   . Bacteremia   . 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 Perforated prepyloric gastric ulcer s/p antrectomy with Billroth II reconstruction 01/30/2011    Past Surgical History  Procedure Laterality Date  . Orif right hip  1999    [facility]  . Umbilical hernia repair  50 months old    [place]  . Esophagogastroduodenoscopy  01/28/2011    Procedure: ESOPHAGOGASTRODUODENOSCOPY (EGD);  Surgeon: Arlyce Harman, MD;  [facility];  Service: Endoscopy;  Laterality: N/A;  . Colonoscopy  01/28/2011   Procedure: COLONOSCOPY;  Surgeon: Arlyce Harman, MD;  [facility]: [facility];  Service: Endoscopy;  Laterality: N/A;  . Laparotomy  02/04/2011    Procedure: EXPLORATORY LAPAROTOMY;  Surgeon: Fabio Bering;  Location: [facility];  Service: General;  Laterality: N/A;  . Laparotomy  05/07/2011    Procedure: EXPLORATORY LAPAROTOMY;  Surgeon: Ardeth Sportsman, MD;  Location: [facility] OR;  Service: General;  Laterality: N/A;  lysis of adhesions  . Gastrostomy  05/07/2011    Procedure: GASTROSTOMY;  Surgeon: Ardeth Sportsman, MD;  Location[facility] OR;  Service: General;  Laterality: N/A;  g-tube / j -tube placement  . Gastrectomy  05/07/2011    Procedure: GASTRECTOMY;  Surgeon: Ardeth Sportsman, MD;  Location: [facility] OR;  Service: General;  Laterality: N/A;  Partial gastrectomy  . Cholecystectomy  05/07/2011    Procedure: CHOLECYSTECTOMY;  Surgeon: Ardeth Sportsman, MD;  Location: [facility] OR;  Service: General;  Laterality: N/A;  open  . Tee without cardioversion  10/21/2011    Procedure: TRANSESOPHAGEAL ECHOCARDIOGRAM (TEE);  Surgeon: Wendall Stade, MD;  Location: [facility] ENDOSCOPY;  Service: Cardiovascular;  Laterality: N/A;    VITAL SIGNS BP 146/69  Pulse 84  Ht 5'  6" (1.676 m)  Wt 109 lb (49.442 kg)  BMI 17.6 kg/m2   Patient's Medications  New Prescriptions   No medications on file  Previous Medications   ACETAMINOPHEN (TYLENOL) 500 MG TABLET    Take 500 mg by mouth every 6 (six) hours as needed for pain or fever.   AMLODIPINE (NORVASC) 10 MG TABLET    Take 10 mg by mouth daily.    BUTALBITAL-ACETAMINOPHEN-CAFFEINE (FIORICET, ESGIC) 50-325-40 MG PER TABLET    Take 2 tablets by mouth every 8 (eight) hours as needed. For headache   CHOLECALCIFEROL (VITAMIN D3) 50000 UNITS CAPS    Take 1 capsule by mouth every 7 (seven) days. Take on Thursday   CLONIDINE (CATAPRES) 0.1 MG TABLET    Take 0.1 mg by mouth daily.   DARIFENACIN (ENABLEX) 15 MG 24 HR TABLET    Take 15 mg by mouth daily.    INSULIN ASPART (NOVOLOG) 100 UNIT/ML  INJECTION    Inject 5 Units into the skin 3 (three) times daily as needed. For cbg >150 take 5 units 15 minutes before or 30 minutes after meal   LORAZEPAM (ATIVAN) 0.5 MG TABLET    Take 0.5 mg by mouth 4 (four) times daily.    METOPROLOL (LOPRESSOR) 50 MG TABLET    Take 50 mg by mouth 2 (two) times daily.    OLOPATADINE HCL (PATADAY) 0.2 % SOLN    Apply 1 drop to eye daily. Both eyes   OMEPRAZOLE (PRILOSEC) 20 MG CAPSULE    Take 20 mg by mouth 2 (two) times daily.   SERTRALINE (ZOLOFT) 100 MG TABLET    Take 100 mg by mouth daily.   Modified Medications   Modified Medication Previous Medication   MORPHINE 20 MG/5ML SOLUTION morphine 20 MG/5ML solution      Take 5-10 mg by mouth every 2 (two) hours as needed. Take 0.51ml-0.5ml by mouth every 2 hours as needed for pain AND 5 MG EVERY 6 HOURS ROUTINELY    Take 0.14ml-0.5ml by mouth every 2 hours as needed for pain  Discontinued Medications   No medications on file    SIGNIFICANT DIAGNOSTIC EXAMS   12-23-12: right knee x-ray: mild osteoarthritis of right knee 12-23-12: right femur: no acute osseous findings.   LABS REVIEWED:  04-22-12:  vit D <10 06-01-12: wbc 5.9; hgb 10.7; hct 33.1; mcv 75.9 ;plt 422  07-01-12: glucose 101; bun 9; creat 0.48; k+ 4.3; na++ 137; alk phos 142; albumin 2.4; ca++ 8.0 hgb a1c 5.7  07-13-12: vit d 10  09-19-12: wbc 3.4; hgb 11.4; hct 34.5 ;mcv 87.1 ;plt 383  12-23-12:wbc 3.6; hgb 10.6; hct 32.3;mcv 85.4 plt 227; glucose 73; bun 8; creat 0.50; k+3.4;na++139 Liver normal albumin 2.6; hgb a1c 5.4    Review of Systems  Constitutional: Negative for malaise/fatigue.  Respiratory: Negative for cough and shortness of breath.   Cardiovascular: Negative for chest pain and palpitations.  Gastrointestinal: Negative for heartburn, nausea, abdominal pain and constipation.  Musculoskeletal: Negative for myalgias.  Skin: Negative.   Neurological: Negative for headaches.  Psychiatric/Behavioral: Negative for depression. HAS  INCREASED ANXIETY   Physical Exam  Constitutional:  thin  Neck: Neck supple. No JVD present. No thyromegaly present.  Cardiovascular: Normal rate, regular rhythm and intact distal pulses.   Respiratory: Effort normal and breath sounds normal. No respiratory distress. She has no wheezes.  GI: Soft. Bowel sounds are normal. She exhibits no distension. There is no tenderness.  Musculoskeletal: Normal range of motion. She exhibits  no edema.  Neurological: She is alert.  Skin: Skin is warm and dry.  Psychiatric: She has a normal mood and affect.      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 Her pain is being managed with roxanol 5 mg every 6 hours; and 5 or 10 mg every 2 hours as needed; will not make changes and will continue to monitor her status. She has a poor ability to absorb calories and nutrients.   Unspecified vitamin D deficiency Will continue her vitamin d 50,000 units weekly and will monitor   Urinary incontinence Is stable will continue enablex 15 mg daily   DIABETES MELLITUS, TYPE II She is stable will continue novolog 5 units prior to meals for cbg >=150 and will monitor her status   Anxiety state, unspecified Her anxiety is worse; will increase her zoloft to 150 mg daily and will monitor her status.   Headache(784.0) Her headaches are being managed with fioricet 2 tabs every 8 hours as needed will not make changes.

## 2013-05-15 ENCOUNTER — Encounter: Payer: Self-pay | Admitting: Adult Health

## 2013-05-15 ENCOUNTER — Non-Acute Institutional Stay (SKILLED_NURSING_FACILITY): Payer: Medicare HMO | Admitting: Adult Health

## 2013-05-15 DIAGNOSIS — I10 Essential (primary) hypertension: Secondary | ICD-10-CM

## 2013-05-15 DIAGNOSIS — G8929 Other chronic pain: Secondary | ICD-10-CM

## 2013-05-15 DIAGNOSIS — F411 Generalized anxiety disorder: Secondary | ICD-10-CM

## 2013-05-15 DIAGNOSIS — R51 Headache: Secondary | ICD-10-CM

## 2013-05-15 DIAGNOSIS — K2210 Ulcer of esophagus without bleeding: Secondary | ICD-10-CM

## 2013-05-15 DIAGNOSIS — K316 Fistula of stomach and duodenum: Secondary | ICD-10-CM

## 2013-05-15 DIAGNOSIS — E559 Vitamin D deficiency, unspecified: Secondary | ICD-10-CM

## 2013-05-15 DIAGNOSIS — R32 Unspecified urinary incontinence: Secondary | ICD-10-CM

## 2013-05-15 DIAGNOSIS — E1165 Type 2 diabetes mellitus with hyperglycemia: Secondary | ICD-10-CM | POA: Insufficient documentation

## 2013-05-15 MED ORDER — MORPHINE SULFATE 20 MG/5ML PO SOLN: 5.0000 mg | ORAL | Status: DC | PRN

## 2013-05-15 NOTE — Progress Notes (Signed)
Patient ID: [name], female   DOB: [birth year], 79 y.o.   MRN: [id number]  [facility]  Allergies  Allergen Reactions  . Other Swelling    Pinto beans cause swelling and hives  . Penicillins Swelling     Chief Complaint  Patient presents with  . Medical Managment of Chronic Issues    HPI: She is being seen for the management of her chronic illnesses. Overall her status is without significant change. She is complaining of abdominal pain which is not being adequately relieved with her current regimen. There are no concerns being voiced by the nursing staff at this time.   Past Medical History  Diagnosis Date  . Hypertension   . Reflux   . Right elbow pain     OTIF  . Dementia   . Depression   . Osteoarthritis   . Pancreatitis 11/2007    [facility]  . Angiomyolipoma of kidney     right  . Ulcerative esophagitis 12/05/2007    hx elevated gastrin, severe on EGD by Dr Jena Gauss , h pylori negative  . Hiatal hernia   . S/P colonoscopy 2009    pt reports normal by Dr Lovell Sheehan  . Diabetes mellitus   . Anemia   . Hyponatremia   . Cellulitis   . Esophagitis   . Peptic ulcer   . Insomnia   . Sepsis(995.91)   . Respiratory failure   . Sepsis(995.91)   . Clostridium difficile infection   . Esophagitis   . Depression   . Encephalitis   . Urinary tract infection   . Bacteremia   . 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 Perforated prepyloric gastric ulcer s/p antrectomy with Billroth II reconstruction 01/30/2011    Past Surgical History  Procedure Laterality Date  . Orif right hip  1999    [facility]  . Umbilical hernia repair  34 months old    [place]  . Esophagogastroduodenoscopy  01/28/2011    Procedure: ESOPHAGOGASTRODUODENOSCOPY (EGD);  Surgeon: Arlyce Harman, MD;  [facility];  Service: Endoscopy;  Laterality: N/A;  . Colonoscopy  01/28/2011    Procedure: COLONOSCOPY;  Surgeon: Arlyce Harman, MD;  [facility];  Service:  Endoscopy;  Laterality: N/A;  . Laparotomy  02/04/2011    Procedure: EXPLORATORY LAPAROTOMY;  Surgeon: Fabio Bering;  Location: [facility];  Service: General;  Laterality: N/A;  . Laparotomy  05/07/2011    Procedure: EXPLORATORY LAPAROTOMY;  Surgeon: Ardeth Sportsman, MD;  Location: [facility] OR;  Service: General;  Laterality: N/A;  lysis of adhesions  . Gastrostomy  05/07/2011    Procedure: GASTROSTOMY;  Surgeon: Ardeth Sportsman, MD;  Location[facility] OR;  Service: General;  Laterality: N/A;  g-tube / j -tube placement  . Gastrectomy  05/07/2011    Procedure: GASTRECTOMY;  Surgeon: Ardeth Sportsman, MD;  Location: [facility] OR;  Service: General;  Laterality: N/A;  Partial gastrectomy  . Cholecystectomy  05/07/2011    Procedure: CHOLECYSTECTOMY;  Surgeon: Ardeth Sportsman, MD;  Location: [facility] OR;  Service: General;  Laterality: N/A;  open  . Tee without cardioversion  10/21/2011    Procedure: TRANSESOPHAGEAL ECHOCARDIOGRAM (TEE);  Surgeon: Wendall Stade, MD;  [facility] ENDOSCOPY;  Service: Cardiovascular;  Laterality: N/A;    VITAL SIGNS BP 126/71  Pulse 67  Ht 5\' 6"  (1.676 m)  Wt 115 lb (52.164 kg)  BMI 18.57 kg/m2   Patient's Medications  New Prescriptions  No medications on file  Previous Medications   ACETAMINOPHEN (TYLENOL) 500 MG TABLET    Take 500 mg by mouth every 6 (six) hours as needed for pain or fever.   AMLODIPINE (NORVASC) 10 MG TABLET    Take 10 mg by mouth daily.    BUTALBITAL-ACETAMINOPHEN-CAFFEINE (FIORICET, ESGIC) 50-325-40 MG PER TABLET    Take 2 tablets by mouth every 8 (eight) hours as needed. For headache   CHOLECALCIFEROL (VITAMIN D) 1000 UNITS TABLET    Take 2,000 Units by mouth daily.   CLONIDINE (CATAPRES) 0.1 MG TABLET    Take 0.1 mg by mouth daily.   DARIFENACIN (ENABLEX) 15 MG 24 HR TABLET    Take 15 mg by mouth daily.    INSULIN ASPART (NOVOLOG) 100 UNIT/ML INJECTION    Inject 5 Units into the skin 3 (three) times daily as needed. For cbg >150 take 5 units 15 minutes before or  30 minutes after meal   LORAZEPAM (ATIVAN) 0.5 MG TABLET    Take 0.5 mg by mouth 4 (four) times daily.    METOPROLOL (LOPRESSOR) 50 MG TABLET    Take 50 mg by mouth 2 (two) times daily.    MORPHINE 20 MG/5ML SOLUTION    Take 5-10 mg by mouth every 2 (two) hours as needed. Take 0.58ml-0.5ml by mouth every 2 hours as needed for pain AND 5 MG EVERY 6 HOURS ROUTINELY   OLOPATADINE HCL (PATADAY) 0.2 % SOLN    Apply 1 drop to eye daily. Both eyes   OMEPRAZOLE (PRILOSEC) 20 MG CAPSULE    Take 20 mg by mouth 2 (two) times daily.   SERTRALINE (ZOLOFT) 100 MG TABLET    Take 1.5 tablets (150 mg total) by mouth daily.  Modified Medications   No medications on file  Discontinued Medications   CHOLECALCIFEROL (VITAMIN D3) 50000 UNITS CAPS    Take 1 capsule by mouth every 7 (seven) days. Take on Thursday    SIGNIFICANT DIAGNOSTIC EXAMS  12-23-12: right knee x-ray: mild osteoarthritis of right knee 12-23-12: right femur: no acute osseous findings.   LABS REVIEWED:  04-22-12:  vit D <10 06-01-12: wbc 5.9; hgb 10.7; hct 33.1; mcv 75.9 ;plt 422  07-01-12: glucose 101; bun 9; creat 0.48; k+ 4.3; na++ 137; alk phos 142; albumin 2.4; ca++ 8.0 hgb a1c 5.7  07-13-12: vit d 10  09-19-12: wbc 3.4; hgb 11.4; hct 34.5 ;mcv 87.1 ;plt 383  12-23-12:wbc 3.6; hgb 10.6; hct 32.3;mcv 85.4 plt 227; glucose 73; bun 8; creat 0.50; k+3.4;na++139 Liver normal albumin 2.6; hgb a1c 5.4  03-03-13: glucose 83; bun 7; creat 0.49; k+3.9; na++137    Review of Systems  Constitutional: Negative for malaise/fatigue.  Respiratory: Negative for cough and shortness of breath.   Cardiovascular: Negative for chest pain and palpitations.  Gastrointestinal: Negative for heartburn, nausea,  and constipation. HAS ABDOMINAL PAIN  Musculoskeletal: Negative for myalgias.  Skin: Negative.   Neurological: Negative for headaches.  Psychiatric/Behavioral: Negative for depression. HAS INCREASED ANXIETY   Physical Exam  Constitutional:  thin  Neck:  Neck supple. No JVD present. No thyromegaly present.  Cardiovascular: Normal rate, regular rhythm and intact distal pulses.   Respiratory: Effort normal and breath sounds normal. No respiratory distress. She has no wheezes.  GI: Soft. Bowel sounds are normal. She exhibits no distension. There is no tenderness.  Musculoskeletal: Normal range of motion. She exhibits no edema.  Neurological: She is alert.  Skin: Skin is warm and dry.  Psychiatric: She has  a normal mood and affect.      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 Her pain is not being managed with roxanol 5 mg every 6 hours; and 5 or 10 mg every 2 hours as needed; will increase her routine roxanol to 10 mg every 6 hours ans will continue her prn dosing. She has a poor ability to absorb calories and nutrients. Will monitor her status.   Unspecified vitamin D deficiency Will continue vit d 2000 units daily    Urinary incontinence Is stable will continue enablex 15 mg daily   DIABETES MELLITUS, TYPE II She is stable will continue novolog 5 units prior to meals for cbg >=150 and will monitor her status   Anxiety state, unspecified Her anxiety is stable will continue zoloft 105 mg daily and will monitor   Headache(784.0) Her headaches are being managed with fioricet 2 tabs every 8 hours as needed will not make changes.

## 2013-07-07 ENCOUNTER — Non-Acute Institutional Stay (SKILLED_NURSING_FACILITY): Payer: Medicare HMO | Admitting: Internal Medicine

## 2013-07-07 ENCOUNTER — Encounter: Payer: Self-pay | Admitting: Internal Medicine

## 2013-07-07 DIAGNOSIS — R51 Headache: Secondary | ICD-10-CM

## 2013-07-07 DIAGNOSIS — E1165 Type 2 diabetes mellitus with hyperglycemia: Secondary | ICD-10-CM

## 2013-07-07 DIAGNOSIS — K208 Other esophagitis without bleeding: Secondary | ICD-10-CM

## 2013-07-07 DIAGNOSIS — IMO0002 Reserved for concepts with insufficient information to code with codable children: Secondary | ICD-10-CM

## 2013-07-07 DIAGNOSIS — K316 Fistula of stomach and duodenum: Secondary | ICD-10-CM

## 2013-07-07 DIAGNOSIS — K2210 Ulcer of esophagus without bleeding: Secondary | ICD-10-CM

## 2013-07-07 DIAGNOSIS — I10 Essential (primary) hypertension: Secondary | ICD-10-CM

## 2013-07-07 DIAGNOSIS — F411 Generalized anxiety disorder: Secondary | ICD-10-CM

## 2013-07-07 DIAGNOSIS — E118 Type 2 diabetes mellitus with unspecified complications: Secondary | ICD-10-CM

## 2013-07-07 DIAGNOSIS — R32 Unspecified urinary incontinence: Secondary | ICD-10-CM

## 2013-07-07 NOTE — Progress Notes (Signed)
Patient ID: [name], female   DOB: Jul 10, 1931, 80 y.o.   MRN: [id number]  \  [name]  Chief Complaint  Patient presents with  . Medical Managment of Chronic Issues   Allergies  Allergen Reactions  . Other Swelling    Pinto beans cause swelling and hives  . Penicillins Swelling   HPI:   80 y/o female patient is a LTC resident of the facility. She was seen in her room today. Her weight remains stable, appetite is good. No new concern from staff. She is also seen by palliative care services  Review of Systems:  Constitutional: negative for malaise and fatigue Respiratory: Negative for cough and shortness of breath.    Cardiovascular: Negative for chest pain.   Gastrointestinal: Negative for diarrhea, nausea, vomiting, abdominal pain and constipation.   Musculoskeletal: Positive for back pain. Negative for myalgias.   Skin: Negative.    Neurological: headaches is stable.   Psychiatric/Behavioral: Negative for depression. The patient does not have insomnia.      Past Medical History  Diagnosis Date  . Hypertension   . Reflux   . Right elbow pain     OTIF  . Dementia   . Depression   . Osteoarthritis   . Pancreatitis 11/2007    [facility]  . Angiomyolipoma of kidney     right  . Ulcerative esophagitis 12/05/2007    hx elevated gastrin, severe on EGD by Dr Gala Romney , h pylori negative  . Hiatal hernia   . S/P colonoscopy 2009    pt reports normal by Dr Arnoldo Morale  . Diabetes mellitus   . Anemia   . Hyponatremia   . Cellulitis   . Esophagitis   . Peptic ulcer   . Insomnia   . Sepsis(995.91)   . Respiratory failure   . Sepsis(995.91)   . Clostridium difficile infection   . Esophagitis   . Depression   . Encephalitis   . Urinary tract infection   . Bacteremia   . 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 Perforated prepyloric gastric ulcer s/p antrectomy with Billroth II reconstruction 01/30/2011   Medication reviewed. See  [facility]  Physical exam BP 110/70  Pulse 67  Temp(Src) 97.4 F (36.3 C)  Resp 16  SpO2 97%  Constitutional:  thin  Neck: Neck supple. No JVD present. No thyromegaly present.  Cardiovascular: Normal rate, regular rhythm and intact distal pulses.   Respiratory: Effort normal and breath sounds normal. No respiratory distress. She has no wheezes.  GI: Soft. Bowel sounds are normal. She exhibits no distension. There is no tenderness.  Musculoskeletal: Normal range of motion. She exhibits no edema. Uses her wheelchair Neurological: She is alert.  Skin: Skin is warm and dry.  Psychiatric: She has a normal mood and affect  Labs- 04-22-12:  vit D <10 06-01-12: wbc 5.9; hgb 10.7; hct 33.1; mcv 75.9 ;plt 422   07-01-12: glucose 101; bun 9; creat 0.48; k+ 4.3; na++ 137; alk phos 142; albumin 2.4; ca++ 8.0 hgb a1c 5.7   07-13-12: vit d 10   09-19-12: wbc 3.4; hgb 11.4; hct 34.5 ;mcv 87.1 ;plt 383   12-23-12:wbc 3.6; hgb 10.6; hct 32.3;mcv 85.4 plt 227; glucose 73; bun 8; creat 0.50; k+3.4;na++139 Liver normal albumin 2.6; hgb a1c 5.4   03-03-13: glucose 83; bun 7; creat 0.49; k+3.9; na++137    Assessment/Plan  HYPERTENSION bp is well controlled. With her age and other medical problem, will decrease her norvasc  to 5 mg daily. Continue lopressor 50 mg twice daily and clonidine 0.1 mg daily.   Esophagitis, erosive Will continue her omeprazole  Urinary incontinence Will continue enablex 15 mg daily   Anxiety state, unspecified Stable currently, continue standing regimen of ativan and sertraline  Headache Under control, less frequent now, continue her Fioricet 2 tabs every 8 hours as needed   DIABETES MELLITUS, TYPE II Last a1c 5.4. cbg stable recently will most most reading < 150 and one of 277. Will d/c novolog for now. Change  cbg check to once a week. 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 Continue morphine standing order and  change prn order to every 6h as needed for now

## 2013-08-03 ENCOUNTER — Non-Acute Institutional Stay (SKILLED_NURSING_FACILITY): Payer: Medicare HMO | Admitting: Internal Medicine

## 2013-08-03 ENCOUNTER — Encounter: Payer: Self-pay | Admitting: Internal Medicine

## 2013-08-03 DIAGNOSIS — E118 Type 2 diabetes mellitus with unspecified complications: Secondary | ICD-10-CM

## 2013-08-03 DIAGNOSIS — M79606 Pain in leg, unspecified: Secondary | ICD-10-CM

## 2013-08-03 DIAGNOSIS — K208 Other esophagitis without bleeding: Secondary | ICD-10-CM

## 2013-08-03 DIAGNOSIS — I10 Essential (primary) hypertension: Secondary | ICD-10-CM

## 2013-08-03 DIAGNOSIS — M79609 Pain in unspecified limb: Secondary | ICD-10-CM

## 2013-08-03 DIAGNOSIS — R51 Headache: Secondary | ICD-10-CM

## 2013-08-03 DIAGNOSIS — F411 Generalized anxiety disorder: Secondary | ICD-10-CM

## 2013-08-03 DIAGNOSIS — K2210 Ulcer of esophagus without bleeding: Secondary | ICD-10-CM

## 2013-08-03 NOTE — Progress Notes (Signed)
Patient ID: [name], female   DOB: 11/30/1931, 81 y.o.   MRN: [id number]     [facility]  Chief complaint- medical management of chronic illness  HPI:   81 y/o female patient is a LTC resident of the facility. She was seen in her room today. She has occassional frontal headache and fioricet helps. She complaints of heart burn. She also has been having intermittent muscle aches with leg cramp. Currently symptom free. Her weight remains stable, appetite is good. No new concern from staff.   Review of Systems:  Constitutional: negative for malaise and fatigue Respiratory: Negative for cough and shortness of breath.    Cardiovascular: Negative for chest pain.   Gastrointestinal: Negative for diarrhea, nausea, vomiting, abdominal pain and constipation.   Musculoskeletal: Negative for myalgias.   Skin: Negative.    Neurological: no new focal deficit, no dizziness  Psychiatric/Behavioral: Negative for depression. The patient does not have insomnia.       Past Medical History  Diagnosis Date  . Hypertension   . Reflux   . Right elbow pain     OTIF  . Dementia   . Depression   . Osteoarthritis   . Pancreatitis 11/2007    [facility]  . Angiomyolipoma of kidney     right  . Ulcerative esophagitis 12/05/2007    hx elevated gastrin, severe on EGD by Dr Rourk , h pylori negative  . Hiatal hernia   . S/P colonoscopy 2009    pt reports normal by Dr Jenkins  . Diabetes mellitus   . Anemia   . Hyponatremia   . Cellulitis   . Esophagitis   . Peptic ulcer   . Insomnia   . Sepsis(995.91)   . Respiratory failure   . Sepsis(995.91)   . Clostridium difficile infection   . Esophagitis   . Depression   . Encephalitis   . Urinary tract infection   . Bacteremia   . 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 Perforated prepyloric gastric ulcer s/p antrectomy with Billroth II reconstruction 01/30/2011   Past Surgical History  Procedure Laterality  Date  . Orif right hip  1999    [facility]  . Umbilical hernia repair  13 months old    [place]  . Esophagogastroduodenoscopy  01/28/2011    Procedure: ESOPHAGOGASTRODUODENOSCOPY (EGD);  Surgeon: Sandi M Fields, MD;  [facility];  Service: Endoscopy;  Laterality: N/A;  . Colonoscopy  01/28/2011    Procedure: COLONOSCOPY;  Surgeon: Sandi M Fields, MD;  [facility];  Service: Endoscopy;  Laterality: N/A;  . Laparotomy  02/04/2011    Procedure: EXPLORATORY LAPAROTOMY;  Surgeon: Brent C Ziegler;  Location: [facility];  Service: General;  Laterality: N/A;  . Laparotomy  05/07/2011    Procedure: EXPLORATORY LAPAROTOMY;  Surgeon: Steven C. Gross, MD;  Location: [facility];  Service: General;  Laterality: N/A;  lysis of adhesions  . Gastrostomy  05/07/2011    Procedure: GASTROSTOMY;  Surgeon: Steven C. Gross, MD;  Location: [facility];  Service: General;  Laterality: N/A;  g-tube / j -tube placement  . Gastrectomy  05/07/2011    Procedure: GASTRECTOMY;  Surgeon: Steven C. Gross, MD;  Location: [facility];  Service: General;  Laterality: N/A;  Partial gastrectomy  . Cholecystectomy  05/07/2011    Procedure: CHOLECYSTECTOMY;  Surgeon: Steven C. Gross, MD;  Location: [facility];  Service: General;  Laterality: N/A;  open  . Tee without cardioversion  10/21/2011      Procedure: TRANSESOPHAGEAL ECHOCARDIOGRAM (TEE);  Surgeon: Josue Hector, MD;  Location: [facility] ENDOSCOPY;  Service: Cardiovascular;  Laterality: N/A;   Current Outpatient Prescriptions on File Prior to Visit  Medication Sig Dispense Refill  . acetaminophen (TYLENOL) 500 MG tablet Take 500 mg by mouth every 6 (six) hours as needed for pain or fever.      Marland Kitchen amLODipine (NORVASC) 10 MG tablet Take 5 mg by mouth daily.       . butalbital-acetaminophen-caffeine (FIORICET, ESGIC) 50-325-40 MG per tablet Take 2 tablets by mouth every 8 (eight) hours as needed. For headache      . cholecalciferol (VITAMIN D) 1000 UNITS tablet Take 2,000 Units by mouth daily.      .  cloNIDine (CATAPRES) 0.1 MG tablet Take 0.1 mg by mouth daily.      Marland Kitchen darifenacin (ENABLEX) 15 MG 24 hr tablet Take 15 mg by mouth daily.       . insulin aspart (NOVOLOG) 100 UNIT/ML injection Inject 5 Units into the skin 3 (three) times daily as needed. For cbg >150 take 5 units 15 minutes before or 30 minutes after meal      . LORazepam (ATIVAN) 0.5 MG tablet Take 0.5 mg by mouth 4 (four) times daily.       . metoprolol (LOPRESSOR) 50 MG tablet Take 50 mg by mouth 2 (two) times daily.       Marland Kitchen morphine 20 MG/5ML solution Take 1.3-2.5 mLs (5.2-10 mg total) by mouth every 2 (two) hours as needed. TAKE 10 MG EVERY 6 HOURS ROUTINELY AND 5-10 mg every 2 hours as needed for pain  30 mL  0  . Olopatadine HCl (PATADAY) 0.2 % SOLN Apply 1 drop to eye daily. Both eyes      . omeprazole (PRILOSEC) 20 MG capsule Take 20 mg by mouth 2 (two) times daily.      . sertraline (ZOLOFT) 100 MG tablet Take 1.5 tablets (150 mg total) by mouth daily.  45 tablet  11   No current facility-administered medications on file prior to visit.    Physical exam BP 140/72  Pulse 74  Temp(Src) 96.8 F (36 C)  Resp 16  Ht 5' 6" (1.676 m)  Wt 112 lb (50.803 kg)  BMI 18.09 kg/m2  SpO2 97%  Constitutional: thin   Neck: Neck supple. No JVD present. No thyromegaly present.   Cardiovascular: Normal rate, regular rhythm and intact distal pulses.    Respiratory: Effort normal and breath sounds normal. No respiratory distress. She has no wheezes.   GI: Soft. Bowel sounds are normal. She exhibits no distension. There is no tenderness.  Musculoskeletal: Normal range of motion. She exhibits no edema. Uses her wheelchair. No tenderness on exam Neurological: She is alert.   Skin: Skin is warm and dry.  Psychiatric: She has a normal mood and affect  Labs- 04-22-12:  vit D <10 06-01-12: wbc 5.9; hgb 10.7; hct 33.1; mcv 75.9 ;plt 422   07-01-12: glucose 101; bun 9; creat 0.48; k+ 4.3; na++ 137; alk phos 142; albumin 2.4; ca++  8.0 hgb a1c 5.7   07-13-12: vit d 10   09-19-12: wbc 3.4; hgb 11.4; hct 34.5 ;mcv 87.1 ;plt 383   12-23-12:wbc 3.6; hgb 10.6; hct 32.3;mcv 85.4 plt 227; glucose 73; bun 8; creat 0.50; k+3.4;na++139 Liver normal albumin 2.6; hgb a1c 5.4   03-03-13: glucose 83; bun 7; creat 0.49; k+3.9; na++137  07-10-13 a1c 5.4   Assessment/Plan  Esophagitis, erosive With her reflux symptom,  Will continue her omeprazole and add ranitidine 150 mg daily. reassess  Leg pain Unclear about the cause. Normal exam. Concern for vitamin d deficiency vs possible neuropathy in setting of history of dm. Check vitamin d level. Trial of neurontin 100 mg daily for now and reassess. Continue vitamin d 2000 u daily  HYPERTENSION bp is well controlled. continue norvasc 5 mg daily, lopressor 50 mg twice daily and clonidine 0.1 mg daily.   Urinary incontinence Will continue enablex 15 mg daily   DIABETES MELLITUS, TYPE II Last a1c 5.4. cbg stable recently will most most reading < 150.off all medication  Anxiety state, unspecified Stable currently, continue standing regimen of ativan and sertraline  Headache continue her Fioricet 2 tabs every 8 hours as needed   Internal gastroileal fistula with functional short gut syndrome Weight has been stable. Continue morphine standing order and change prn order to every 6h as needed for now     

## 2013-08-16 ENCOUNTER — Other Ambulatory Visit: Payer: Self-pay | Admitting: *Deleted

## 2013-08-16 MED ORDER — MORPHINE SULFATE (CONCENTRATE) 20 MG/ML PO SOLN: ORAL | Status: DC

## 2013-08-27 DIAGNOSIS — T148XXA Other injury of unspecified body region, initial encounter: Secondary | ICD-10-CM

## 2013-08-27 HISTORY — DX: Other injury of unspecified body region, initial encounter: T14.8XXA

## 2013-09-13 ENCOUNTER — Other Ambulatory Visit: Payer: Self-pay | Admitting: *Deleted

## 2013-09-13 MED ORDER — AMBULATORY NON FORMULARY MEDICATION: Status: DC

## 2013-09-13 NOTE — Telephone Encounter (Signed)
[facility] [place] 

## 2013-09-18 ENCOUNTER — Inpatient Hospital Stay (HOSPITAL_COMMUNITY)
Admission: EM | Admit: 2013-09-18 | Discharge: 2013-09-22 | DRG: 481 | Disposition: A | Discharge status: Skilled Nursing Facility | Payer: Medicare HMO | Attending: Internal Medicine | Admitting: Internal Medicine

## 2013-09-18 ENCOUNTER — Emergency Department (HOSPITAL_COMMUNITY): Payer: Medicare HMO

## 2013-09-18 ENCOUNTER — Encounter (HOSPITAL_COMMUNITY): Payer: Self-pay | Admitting: Emergency Medicine

## 2013-09-18 DIAGNOSIS — F329 Major depressive disorder, single episode, unspecified: Secondary | ICD-10-CM | POA: Diagnosis present

## 2013-09-18 DIAGNOSIS — S72009A Fracture of unspecified part of neck of unspecified femur, initial encounter for closed fracture: Secondary | ICD-10-CM

## 2013-09-18 DIAGNOSIS — F3289 Other specified depressive episodes: Secondary | ICD-10-CM | POA: Diagnosis present

## 2013-09-18 DIAGNOSIS — K5730 Diverticulosis of large intestine without perforation or abscess without bleeding: Secondary | ICD-10-CM | POA: Diagnosis present

## 2013-09-18 DIAGNOSIS — D649 Anemia, unspecified: Secondary | ICD-10-CM

## 2013-09-18 DIAGNOSIS — S72002A Fracture of unspecified part of neck of left femur, initial encounter for closed fracture: Secondary | ICD-10-CM

## 2013-09-18 DIAGNOSIS — W010XXA Fall on same level from slipping, tripping and stumbling without subsequent striking against object, initial encounter: Secondary | ICD-10-CM | POA: Diagnosis present

## 2013-09-18 DIAGNOSIS — Z9089 Acquired absence of other organs: Secondary | ICD-10-CM

## 2013-09-18 DIAGNOSIS — E559 Vitamin D deficiency, unspecified: Secondary | ICD-10-CM | POA: Diagnosis present

## 2013-09-18 DIAGNOSIS — E785 Hyperlipidemia, unspecified: Secondary | ICD-10-CM | POA: Diagnosis present

## 2013-09-18 DIAGNOSIS — D62 Acute posthemorrhagic anemia: Secondary | ICD-10-CM | POA: Diagnosis not present

## 2013-09-18 DIAGNOSIS — E440 Moderate protein-calorie malnutrition: Secondary | ICD-10-CM | POA: Diagnosis present

## 2013-09-18 DIAGNOSIS — G8929 Other chronic pain: Secondary | ICD-10-CM | POA: Diagnosis present

## 2013-09-18 DIAGNOSIS — K219 Gastro-esophageal reflux disease without esophagitis: Secondary | ICD-10-CM | POA: Diagnosis present

## 2013-09-18 DIAGNOSIS — S72143A Displaced intertrochanteric fracture of unspecified femur, initial encounter for closed fracture: Principal | ICD-10-CM | POA: Diagnosis present

## 2013-09-18 DIAGNOSIS — Z86718 Personal history of other venous thrombosis and embolism: Secondary | ICD-10-CM

## 2013-09-18 DIAGNOSIS — S72142A Displaced intertrochanteric fracture of left femur, initial encounter for closed fracture: Secondary | ICD-10-CM

## 2013-09-18 DIAGNOSIS — E1165 Type 2 diabetes mellitus with hyperglycemia: Secondary | ICD-10-CM | POA: Diagnosis present

## 2013-09-18 DIAGNOSIS — IMO0001 Reserved for inherently not codable concepts without codable children: Secondary | ICD-10-CM | POA: Diagnosis present

## 2013-09-18 DIAGNOSIS — Z79899 Other long term (current) drug therapy: Secondary | ICD-10-CM

## 2013-09-18 DIAGNOSIS — Z681 Body mass index (BMI) 19 or less, adult: Secondary | ICD-10-CM

## 2013-09-18 DIAGNOSIS — Y9210 Unspecified residential institution as the place of occurrence of the external cause: Secondary | ICD-10-CM | POA: Diagnosis present

## 2013-09-18 DIAGNOSIS — E876 Hypokalemia: Secondary | ICD-10-CM | POA: Diagnosis not present

## 2013-09-18 DIAGNOSIS — Z880 Allergy status to penicillin: Secondary | ICD-10-CM

## 2013-09-18 DIAGNOSIS — K279 Peptic ulcer, site unspecified, unspecified as acute or chronic, without hemorrhage or perforation: Secondary | ICD-10-CM

## 2013-09-18 DIAGNOSIS — I10 Essential (primary) hypertension: Secondary | ICD-10-CM | POA: Diagnosis present

## 2013-09-18 DIAGNOSIS — IMO0002 Reserved for concepts with insufficient information to code with codable children: Secondary | ICD-10-CM

## 2013-09-18 DIAGNOSIS — E118 Type 2 diabetes mellitus with unspecified complications: Secondary | ICD-10-CM

## 2013-09-18 DIAGNOSIS — B9681 Helicobacter pylori [H. pylori] as the cause of diseases classified elsewhere: Secondary | ICD-10-CM | POA: Diagnosis present

## 2013-09-18 DIAGNOSIS — F411 Generalized anxiety disorder: Secondary | ICD-10-CM

## 2013-09-18 DIAGNOSIS — R51 Headache: Secondary | ICD-10-CM

## 2013-09-18 DIAGNOSIS — F17200 Nicotine dependence, unspecified, uncomplicated: Secondary | ICD-10-CM | POA: Diagnosis present

## 2013-09-18 DIAGNOSIS — G4700 Insomnia, unspecified: Secondary | ICD-10-CM | POA: Diagnosis present

## 2013-09-18 DIAGNOSIS — S7290XA Unspecified fracture of unspecified femur, initial encounter for closed fracture: Secondary | ICD-10-CM

## 2013-09-18 DIAGNOSIS — F0390 Unspecified dementia without behavioral disturbance: Secondary | ICD-10-CM | POA: Diagnosis present

## 2013-09-18 DIAGNOSIS — D638 Anemia in other chronic diseases classified elsewhere: Secondary | ICD-10-CM | POA: Diagnosis present

## 2013-09-18 DIAGNOSIS — R079 Chest pain, unspecified: Secondary | ICD-10-CM

## 2013-09-18 DIAGNOSIS — I82409 Acute embolism and thrombosis of unspecified deep veins of unspecified lower extremity: Secondary | ICD-10-CM | POA: Diagnosis present

## 2013-09-18 DIAGNOSIS — Z794 Long term (current) use of insulin: Secondary | ICD-10-CM

## 2013-09-18 DIAGNOSIS — E871 Hypo-osmolality and hyponatremia: Secondary | ICD-10-CM | POA: Diagnosis not present

## 2013-09-18 DIAGNOSIS — R32 Unspecified urinary incontinence: Secondary | ICD-10-CM | POA: Diagnosis present

## 2013-09-18 HISTORY — DX: Other injury of unspecified body region, initial encounter: T14.8XXA

## 2013-09-18 LAB — CBC WITH DIFFERENTIAL/PLATELET
Basophils Absolute: 0 10*3/uL (ref 0.0–0.1)
Basophils Relative: 0 % (ref 0–1)
EOS ABS: 0 10*3/uL (ref 0.0–0.7)
EOS PCT: 0 % (ref 0–5)
HEMATOCRIT: 26.8 % — AB (ref 36.0–46.0)
HEMOGLOBIN: 8.7 g/dL — AB (ref 12.0–15.0)
LYMPHS ABS: 1.1 10*3/uL (ref 0.7–4.0)
LYMPHS PCT: 20 % (ref 12–46)
MCH: 29.1 pg (ref 26.0–34.0)
MCHC: 32.5 g/dL (ref 30.0–36.0)
MCV: 89.6 fL (ref 78.0–100.0)
MONO ABS: 0.5 10*3/uL (ref 0.1–1.0)
MONOS PCT: 9 % (ref 3–12)
Neutro Abs: 3.8 10*3/uL (ref 1.7–7.7)
Neutrophils Relative %: 71 % (ref 43–77)
Platelets: 163 10*3/uL (ref 150–400)
RBC: 2.99 MIL/uL — AB (ref 3.87–5.11)
RDW: 18 % — ABNORMAL HIGH (ref 11.5–15.5)
WBC: 5.4 10*3/uL (ref 4.0–10.5)

## 2013-09-18 LAB — GLUCOSE, CAPILLARY
GLUCOSE-CAPILLARY: 106 mg/dL — AB (ref 70–99)
Glucose-Capillary: 110 mg/dL — ABNORMAL HIGH (ref 70–99)

## 2013-09-18 LAB — BASIC METABOLIC PANEL
BUN: 14 mg/dL (ref 6–23)
CALCIUM: 7.5 mg/dL — AB (ref 8.4–10.5)
CO2: 27 meq/L (ref 19–32)
Chloride: 101 mEq/L (ref 96–112)
Creatinine, Ser: 0.59 mg/dL (ref 0.50–1.10)
GFR calc Af Amer: >90 mL/min (ref >90)
GFR, EST NON AFRICAN AMERICAN: 84 mL/min — AB (ref >90)
GLUCOSE: 102 mg/dL — AB (ref 70–99)
Potassium: 3.3 mEq/L — ABNORMAL LOW (ref 3.7–5.3)
Sodium: 140 mEq/L (ref 137–147)

## 2013-09-18 LAB — RETICULOCYTES
RBC.: 2.85 MIL/uL — ABNORMAL LOW (ref 3.87–5.11)
Retic Count, Absolute: 74.1 10*3/uL (ref 19.0–186.0)
Retic Ct Pct: 2.6 % (ref 0.4–3.1)

## 2013-09-18 LAB — SURGICAL PCR SCREEN
MRSA, PCR: NEGATIVE
Staphylococcus aureus: NEGATIVE

## 2013-09-18 MED ORDER — SERTRALINE HCL 50 MG PO TABS
150.0000 mg | ORAL_TABLET | Freq: Every day | ORAL | Status: DC
  Administered 2013-09-19 – 2013-09-22 (×4): 150 mg via ORAL
  Filled 2013-09-18 (×6): qty 1

## 2013-09-18 MED ORDER — POTASSIUM CHLORIDE IN NACL 40-0.9 MEQ/L-% IV SOLN
INTRAVENOUS | Status: DC
  Administered 2013-09-18: via INTRAVENOUS
  Filled 2013-09-18 (×3): qty 1000

## 2013-09-18 MED ORDER — HYDRALAZINE HCL 20 MG/ML IJ SOLN
5.0000 mg | INTRAMUSCULAR | Status: DC | PRN
  Filled 2013-09-18: qty 1

## 2013-09-18 MED ORDER — SODIUM CHLORIDE 0.9 % IV SOLN
INTRAVENOUS | Status: DC
  Administered 2013-09-18: 20:00:00 via INTRAVENOUS

## 2013-09-18 MED ORDER — INSULIN ASPART 100 UNIT/ML ~~LOC~~ SOLN
0.0000 [IU] | SUBCUTANEOUS | Status: DC
  Administered 2013-09-20 (×3): 1 [IU] via SUBCUTANEOUS

## 2013-09-18 MED ORDER — MORPHINE SULFATE 2 MG/ML IJ SOLN
2.0000 mg | INTRAMUSCULAR | Status: DC | PRN
  Administered 2013-09-18: 4 mg via INTRAVENOUS
  Administered 2013-09-19 (×5): 2 mg via INTRAVENOUS
  Administered 2013-09-19: 4 mg via INTRAVENOUS
  Administered 2013-09-19: 2 mg via INTRAVENOUS
  Administered 2013-09-20: 4 mg via INTRAVENOUS
  Administered 2013-09-21 – 2013-09-22 (×2): 2 mg via INTRAVENOUS
  Filled 2013-09-18: qty 2
  Filled 2013-09-18 (×5): qty 1
  Filled 2013-09-18: qty 2
  Filled 2013-09-18 (×4): qty 1

## 2013-09-18 MED ORDER — DARIFENACIN HYDROBROMIDE ER 15 MG PO TB24
15.0000 mg | ORAL_TABLET | Freq: Every day | ORAL | Status: DC
  Administered 2013-09-19 – 2013-09-22 (×4): 15 mg via ORAL
  Filled 2013-09-18 (×5): qty 1

## 2013-09-18 MED ORDER — MORPHINE SULFATE 2 MG/ML IJ SOLN
2.0000 mg | INTRAMUSCULAR | Status: DC | PRN
  Administered 2013-09-18 (×3): 2 mg via INTRAVENOUS
  Filled 2013-09-18 (×3): qty 1

## 2013-09-18 MED ORDER — GABAPENTIN 100 MG PO CAPS
100.0000 mg | ORAL_CAPSULE | Freq: Every day | ORAL | Status: DC
  Administered 2013-09-19 – 2013-09-22 (×4): 100 mg via ORAL
  Filled 2013-09-18 (×5): qty 1

## 2013-09-18 NOTE — Progress Notes (Signed)
Attempted to reach clara daughter lmom Plan for or tomorrow May need pre op transfusion ekg pending

## 2013-09-18 NOTE — ED Provider Notes (Signed)
CSN: [id number]     Arrival date & time 09/18/13  1552 History   First MD Initiated Contact with Patient 09/18/13 1613     Chief Complaint  Patient presents with  . Fall  . Hip Pain     (Consider location/radiation/quality/duration/timing/severity/associated sxs/prior Treatment) Patient is a 80 y.o. female presenting with fall and hip pain. The history is provided by the patient and medical records.  Fall  Hip Pain   patient here from nursing home after an x-ray today reveals a left proximal femur fracture. Patient fell 2 days ago when she slipped. She does normally use a cane to walk. She denies any head trauma. Denies any back pain. No abdominal or chest pain. She is unable to him but at this time. Portal x-ray to the hip fracture and patient sent her for further management. Symptoms have been persistent and worse with movement and better with rest. Pain characterized as sharp. No treatment used prior to arrival.  Past Medical History  Diagnosis Date  . Hypertension   . Reflux   . Right elbow pain     OTIF  . Dementia   . Depression   . Osteoarthritis   . Pancreatitis 11/2007    [facility]  . Angiomyolipoma of kidney     right  . Ulcerative esophagitis 12/05/2007    hx elevated gastrin, severe on EGD by Dr Gala Romney , h pylori negative  . Hiatal hernia   . S/P colonoscopy 2009    pt reports normal by Dr Arnoldo Morale  . Diabetes mellitus   . Anemia   . Hyponatremia   . Cellulitis   . Esophagitis   . Peptic ulcer   . Insomnia   . Sepsis(995.91)   . Respiratory failure   . Sepsis(995.91)   . Clostridium difficile infection   . Esophagitis   . Depression   . Encephalitis   . Urinary tract infection   . Bacteremia   . 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 Perforated prepyloric gastric ulcer s/p antrectomy with Billroth II reconstruction 01/30/2011   Past Surgical History  Procedure Laterality Date  . Orif right hip  1999    [facility]  . Umbilical  hernia repair  1 months old    [place]  . Esophagogastroduodenoscopy  01/28/2011    Procedure: ESOPHAGOGASTRODUODENOSCOPY (EGD);  Surgeon: Dorothyann Peng, MD;  [facility];  Service: Endoscopy;  Laterality: N/A;  . Colonoscopy  01/28/2011    Procedure: COLONOSCOPY;  Surgeon: Dorothyann Peng, MD;  [facility];  Service: Endoscopy;  Laterality: N/A;  . Laparotomy  02/04/2011    Procedure: EXPLORATORY LAPAROTOMY;  Surgeon: Donato Heinz;  Location: [facility];  Service: General;  Laterality: N/A;  . Laparotomy  05/07/2011    Procedure: EXPLORATORY LAPAROTOMY;  Surgeon: Adin Hector, MD;  Location: [place];  Service: General;  Laterality: N/A;  lysis of adhesions  . Gastrostomy  05/07/2011    Procedure: GASTROSTOMY;  Surgeon: Adin Hector, MD;  Location: [place];  Service: General;  Laterality: N/A;  g-tube / j -tube placement  . Gastrectomy  05/07/2011    Procedure: GASTRECTOMY;  Surgeon: Adin Hector, MD;  Location: [place];  Service: General;  Laterality: N/A;  Partial gastrectomy  . Cholecystectomy  05/07/2011    Procedure: CHOLECYSTECTOMY;  Surgeon: Adin Hector, MD;  Location: [place];  Service: General;  Laterality: N/A;  open  . Tee without cardioversion  10/21/2011  Procedure: TRANSESOPHAGEAL ECHOCARDIOGRAM (TEE);  Surgeon: Josue Hector, MD;  Location: [facility];  Service: Cardiovascular;  Laterality: N/A;   Family History  Problem Relation Age of Onset  . Cancer Mother     pelvic    History  Substance Use Topics  . Smoking status: Never Smoker   . Smokeless tobacco: Current User    Types: Chew     Comment: taken away while she is a SNF  . Alcohol Use: No     Comment: Hx of Alcohol dependecy    OB History   Grav Para Term Preterm Abortions TAB SAB Ect Mult Living                 Review of Systems  All other systems reviewed and are negative.      Allergies  Other and Penicillins  Home Medications   Current Outpatient Rx  Name  Route  Sig   Dispense  Refill  . acetaminophen (TYLENOL) 500 MG tablet   Oral   Take 500 mg by mouth every 6 (six) hours as needed for pain or fever.         Marland Kitchen amLODipine (NORVASC) 10 MG tablet   Oral   Take 5 mg by mouth daily.          . butalbital-acetaminophen-caffeine (FIORICET, ESGIC) 50-325-40 MG per tablet   Oral   Take 2 tablets by mouth every 8 (eight) hours as needed. For headache         . cholecalciferol (VITAMIN D) 1000 UNITS tablet   Oral   Take 2,000 Units by mouth daily.         . cloNIDine (CATAPRES) 0.1 MG tablet   Oral   Take 0.1 mg by mouth daily.         Marland Kitchen darifenacin (ENABLEX) 15 MG 24 hr tablet   Oral   Take 15 mg by mouth daily.          Marland Kitchen gabapentin (NEURONTIN) 100 MG capsule   Oral   Take 100 mg by mouth daily.         . insulin aspart (NOVOLOG) 100 UNIT/ML injection   Subcutaneous   Inject 5 Units into the skin 3 (three) times daily as needed. For cbg >150 take 5 units 15 minutes before or 30 minutes after meal         . LORazepam (ATIVAN) 0.5 MG tablet   Oral   Take 0.5 mg by mouth 4 (four) times daily.          . metoprolol (LOPRESSOR) 50 MG tablet   Oral   Take 50 mg by mouth 2 (two) times daily.          Marland Kitchen morphine (ROXANOL) 20 MG/ML concentrated solution      Give 10mg  (0.20ml) by mouth every 6 hours for pain   120 mL   0   . Olopatadine HCl (PATADAY) 0.2 % SOLN   Ophthalmic   Apply 1 drop to eye daily. Both eyes         . omeprazole (PRILOSEC) 20 MG capsule   Oral   Take 20 mg by mouth 2 (two) times daily.         . ranitidine (ZANTAC) 150 MG tablet   Oral   Take 150 mg by mouth daily.         . sertraline (ZOLOFT) 100 MG tablet   Oral   Take 1.5 tablets (150 mg total) by mouth daily.  45 tablet   11    BP 120/62  Pulse 69  Temp(Src) 98.7 F (37.1 C) (Oral)  Resp 18  Ht 5\' 6"  (1.676 m)  Wt 114 lb (51.71 kg)  BMI 18.41 kg/m2  SpO2 100% Physical Exam  Nursing note and vitals  reviewed. Constitutional: She is oriented to person, place, and time. She appears well-developed and well-nourished.  Non-toxic appearance. No distress.  HENT:  Head: Normocephalic and atraumatic.  Eyes: Conjunctivae, EOM and lids are normal. Pupils are equal, round, and reactive to light.  Neck: Normal range of motion. Neck supple. No tracheal deviation present. No mass present.  Cardiovascular: Normal rate, regular rhythm and normal heart sounds.  Exam reveals no gallop.   No murmur heard. Pulmonary/Chest: Effort normal and breath sounds normal. No stridor. No respiratory distress. She has no decreased breath sounds. She has no wheezes. She has no rhonchi. She has no rales.  Abdominal: Soft. Normal appearance and bowel sounds are normal. She exhibits no distension. There is no tenderness. There is no rebound and no CVA tenderness.  Musculoskeletal: Normal range of motion. She exhibits no edema and no tenderness.       Legs: Pain with internal and external rotation of her hip. No shortening or deformity noted. Neurovascular status intact  Neurological: She is alert and oriented to person, place, and time. She has normal strength. No cranial nerve deficit or sensory deficit. GCS eye subscore is 4. GCS verbal subscore is 5. GCS motor subscore is 6.  Skin: Skin is warm and dry. No abrasion and no rash noted.  Psychiatric: She has a normal mood and affect. Her speech is normal and behavior is normal.    ED Course  Procedures (including critical care time) Labs Review Labs Reviewed  CBC WITH DIFFERENTIAL  BASIC METABOLIC PANEL   Imaging Review No results found.   EKG Interpretation None      MDM   Final diagnoses:  None    Patient given morphine for pain. X-rays confirm hip fracture. Spoke with hospitalist as well as orthopedist in the orthopedist would like to perform surgery today if possible. Tract hospitalist to see patient    Leota Jacobsen, MD 09/18/13 [phone]

## 2013-09-18 NOTE — H&P (Signed)
Hospitalist Admission History and Physical  Patient name: [name] Medical record number: [id number] Date of birth: 03-08-1932 Age: 80 y.o. Gender: female  Primary Care Provider: Hollace Kinnier, DO  Chief Complaint: fall, L femur fracture  History of Present Illness:This is a 80 y.o. year old female nursing home resident with multiple medical problems including DM, urinary incontinence, malnutrition, hx/o gastroileal fistula with short gut syndrome presenting with fall and L femur fracture. Per report, pt fell about 6 days ago at nursing home. Pt states that she landed on her L hip. States that she has had L hip pain since this point. No bowel or bladder anesthesia. No distal numbness. Denies any CP, SOB, dizziness prior to fall. Usually ambulates with a cane. Had portable xray obtained at nursing home showing L proximal femur fracture.  On presentation, labs WNL apart from low hgb @ 8.7 with baseline hgb around 10 (from 2013). K low @ 3.3. Calcium low @ 7.5. Hemodynamically stable.  Ortho consulted in ER with rec likely surgery tonight. Medical admission.   Patient Active Problem List   Diagnosis Date Noted  . Femur fracture 09/18/2013  . DM type 2, controlled, with complication 03/50/0938  . Leg pain 08/03/2013  . Type II or unspecified type diabetes mellitus with unspecified complication, uncontrolled 05/15/2013  . Anxiety state, unspecified 11/17/2012  . Unspecified vitamin D deficiency 11/17/2012  . Urinary incontinence 11/17/2012  . Headache(784.0) 11/17/2012  . Insomnia 11/17/2012  . Cellulitis 09/16/2012  . Chronic pain 09/16/2012  . Malnutrition of moderate degree 03/29/2012  . Bacteremia 01/10/2012  . Dementia 01/09/2012  . C. difficile colitis 12/23/2011  . DVT (deep venous thrombosis) 12/21/2011  . PICC line infection 10/13/2011  . 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 Esophagitis, erosive 05/07/2011  . Perforated prepyloric gastric  ulcer s/p antrectomy with Billroth II reconstruction 01/30/2011  . Anemia 01/26/2011  . DIVERTICULOSIS OF COLON 11/28/2007  . HYPERLIPIDEMIA 09/13/2007  . DEPRESSION 09/13/2007  . HYPERTENSION 09/13/2007   Past Medical History: Past Medical History  Diagnosis Date  . Hypertension   . Reflux   . Right elbow pain     OTIF  . Dementia   . Depression   . Osteoarthritis   . Pancreatitis 11/2007    HOP  . Angiomyolipoma of kidney     right  . Ulcerative esophagitis 12/05/2007    hx elevated gastrin, severe on EGD by Dr Gala Romney , h pylori negative  . Hiatal hernia   . S/P colonoscopy 2009    pt reports normal by Dr Arnoldo Morale  . Diabetes mellitus   . Anemia   . Hyponatremia   . Cellulitis   . Esophagitis   . Peptic ulcer   . Insomnia   . Sepsis(995.91)   . Respiratory failure   . Sepsis(995.91)   . Clostridium difficile infection   . Esophagitis   . Depression   . Encephalitis   . Urinary tract infection   . Bacteremia   . 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 Perforated prepyloric gastric ulcer s/p antrectomy with Billroth II reconstruction 01/30/2011    Past Surgical History: Past Surgical History  Procedure Laterality Date  . Orif right hip  1999    [facility]  . Umbilical hernia repair  35 months old    [place]  . Esophagogastroduodenoscopy  01/28/2011    Procedure: ESOPHAGOGASTRODUODENOSCOPY (EGD);  Surgeon: Dorothyann Peng, MD;  [facility];  Service:  Endoscopy;  Laterality: N/A;  . Colonoscopy  01/28/2011    Procedure: COLONOSCOPY;  Surgeon: Dorothyann Peng, MD;  [facility];  Service: Endoscopy;  Laterality: N/A;  . Laparotomy  02/04/2011    Procedure: EXPLORATORY LAPAROTOMY;  Surgeon: Donato Heinz;  Location: [facility];  Service: General;  Laterality: N/A;  . Laparotomy  05/07/2011    Procedure: EXPLORATORY LAPAROTOMY;  Surgeon: Adin Hector, MD;  Location: [place];  Service: General;  Laterality: N/A;  lysis of  adhesions  . Gastrostomy  05/07/2011    Procedure: GASTROSTOMY;  Surgeon: Adin Hector, MD;  Location: [place];  Service: General;  Laterality: N/A;  g-tube / j -tube placement  . Gastrectomy  05/07/2011    Procedure: GASTRECTOMY;  Surgeon: Adin Hector, MD;  Location: [place];  Service: General;  Laterality: N/A;  Partial gastrectomy  . Cholecystectomy  05/07/2011    Procedure: CHOLECYSTECTOMY;  Surgeon: Adin Hector, MD;  Location: [place];  Service: General;  Laterality: N/A;  open  . Tee without cardioversion  10/21/2011    Procedure: TRANSESOPHAGEAL ECHOCARDIOGRAM (TEE);  Surgeon: Josue Hector, MD;  Location[facility];  Service: Cardiovascular;  Laterality: N/A;    Social History: History   Social History  . Marital Status: Widowed    Spouse Name: N/A    Number of Children: 1  . Years of Education: N/A   Occupational History  . retired     Social History Main Topics  . Smoking status: Never Smoker   . Smokeless tobacco: Current User    Types: Chew     Comment: taken away while she is a SNF  . Alcohol Use: No     Comment: Hx of Alcohol dependecy   . Drug Use: No  . Sexual Activity: No   Other Topics Concern  . None   Social History Narrative  . None    Family History: Family History  Problem Relation Age of Onset  . Cancer Mother     pelvic     Allergies: Allergies  Allergen Reactions  . Other Swelling    Pinto beans cause swelling and hives  . Penicillins Swelling    Current Facility-Administered Medications  Medication Dose Route Frequency Provider Last Rate Last Dose  . 0.9 %  sodium chloride infusion   Intravenous Continuous Shanda Howells, MD      . darifenacin (ENABLEX) 24 hr tablet 15 mg  15 mg Oral Daily Shanda Howells, MD      . gabapentin (NEURONTIN) capsule 100 mg  100 mg Oral Daily Shanda Howells, MD      . hydrALAZINE (APRESOLINE) injection 5 mg  5 mg Intravenous Q4H PRN Shanda Howells, MD      . morphine 2 MG/ML injection 2 mg  2 mg  Intravenous Q1H PRN Leota Jacobsen, MD   2 mg at 09/18/13 1755  . morphine 2 MG/ML injection 2-4 mg  2-4 mg Intravenous Q2H PRN Shanda Howells, MD      . sertraline (ZOLOFT) tablet 150 mg  150 mg Oral Daily Shanda Howells, MD       Current Outpatient Prescriptions  Medication Sig Dispense Refill  . acetaminophen (TYLENOL) 500 MG tablet Take 500 mg by mouth every 6 (six) hours as needed for pain or fever.      Marland Kitchen amLODipine (NORVASC) 10 MG tablet Take 5 mg by mouth daily.       . butalbital-acetaminophen-caffeine (FIORICET, ESGIC) 50-325-40 MG per tablet  Take 2 tablets by mouth every 8 (eight) hours as needed. For headache      . cholecalciferol (VITAMIN D) 1000 UNITS tablet Take 2,000 Units by mouth daily.      . cloNIDine (CATAPRES) 0.1 MG tablet Take 0.1 mg by mouth daily.      Marland Kitchen darifenacin (ENABLEX) 15 MG 24 hr tablet Take 15 mg by mouth daily.       Marland Kitchen gabapentin (NEURONTIN) 100 MG capsule Take 100 mg by mouth daily.      . insulin aspart (NOVOLOG) 100 UNIT/ML injection Inject 5 Units into the skin 3 (three) times daily as needed. For cbg >150 take 5 units 15 minutes before or 30 minutes after meal      . LORazepam (ATIVAN) 0.5 MG tablet Take 0.5 mg by mouth 4 (four) times daily.       . metoprolol (LOPRESSOR) 50 MG tablet Take 50 mg by mouth 2 (two) times daily.       Marland Kitchen morphine (ROXANOL) 20 MG/ML concentrated solution Give 10mg  (0.29ml) by mouth every 6 hours for pain  120 mL  0  . Olopatadine HCl (PATADAY) 0.2 % SOLN Apply 1 drop to eye daily. Both eyes      . omeprazole (PRILOSEC) 20 MG capsule Take 20 mg by mouth 2 (two) times daily.      . ranitidine (ZANTAC) 150 MG tablet Take 150 mg by mouth daily.      . sertraline (ZOLOFT) 100 MG tablet Take 1.5 tablets (150 mg total) by mouth daily.  45 tablet  11   Review Of Systems: 12 point ROS negative except as noted above in HPI.  Physical Exam: Filed Vitals:   09/18/13 1900  BP: 145/72  Pulse: 77  Temp:   Resp:     General: alert,  cooperative and underweigth  HEENT: PERRLA and extra ocular movement intact Heart: S1, S2 normal, no murmur, rub or gallop, regular rate and rhythm Lungs: clear to auscultation, no wheezes or rales and unlabored breathing Abdomen: abdomen is soft without significant tenderness, masses, organomegaly or guarding Extremities: + TTP over L lateral thigh and hip, no distal numbness or paresthesias.  Skin:no rashes, no ecchymoses Neurology: normal without focal findings, mental status, speech normal, alert and oriented x3, PERLA and reflexes normal and symmetric  Labs and Imaging: Lab Results  Component Value Date/Time   NA 140 09/18/2013  4:30 PM   K 3.3* 09/18/2013  4:30 PM   CL 101 09/18/2013  4:30 PM   CO2 27 09/18/2013  4:30 PM   BUN 14 09/18/2013  4:30 PM   CREATININE 0.59 09/18/2013  4:30 PM   GLUCOSE 102* 09/18/2013  4:30 PM   Lab Results  Component Value Date   WBC 5.4 09/18/2013   HGB 8.7* 09/18/2013   HCT 26.8* 09/18/2013   MCV 89.6 09/18/2013   PLT 163 09/18/2013    Dg Chest 2 View  09/18/2013   CLINICAL DATA:  Esophagitis in hiatal hernia  EXAM: CHEST  2 VIEW  COMPARISON:  DG CHEST 1V PORT dated 01/10/2012  FINDINGS: Normal cardiac silhouette with ectatic aorta. No effusion, infiltrate, pneumothorax. Chronic elevation of the left hemidiaphragm.  IMPRESSION: No acute cardiopulmonary process.   Electronically Signed   By: Suzy Bouchard M.D.   On: 09/18/2013 17:56   Dg Hip Complete Left  09/18/2013   CLINICAL DATA:  Left hip pain, fall 6 days ago  EXAM: LEFT HIP - COMPLETE 2+ VIEW  COMPARISON:  None.  FINDINGS: Five views of left hip submitted. There is diffuse osteopenia. There is right hip prosthesis in anatomic alignment. There is poorly visualized left femur fracture in intertrochanteric region. On the frontal view there is no definite cortical break through medially. However on the frog view there is a fracture line with cortical step-off medially. There is displacement of greater  femoral trochanter.  IMPRESSION: Fracture of proximal left femur as described above. Limited study by diffuse osteopenia. Right hip prosthesis in anatomic alignment   Electronically Signed   By: Lahoma Crocker M.D.   On: 09/18/2013 18:06     Assessment and Plan: [name] is a 80 y.o. year old female presenting with fall, L femur fracture.   L femur fracture: Per ortho recs. Formal ortho consult pending. Pain control with morphine. NPO in the interim.   Anemia: Acute on chronic issue. No active signs of bleeding though hgb borderline. ? Mild ABLA 2/2 femoral fracture. Hemodynamically stable currently. Type and screen as pt has fairly low phsyiologic reserve pending surgery.  Check anemia panel. SCDs.   DM: SSI, A1C.   HTN: hemodynamically stable currently. Prn hydralazine.   FEN/GI: NPO pending ortho recs. Hold oral meds. NS+ K. Check mag level. Check ionized Ca and intact PTH. Also check vitamin D.   Prophylaxis: SCDs.   Disposition: Pending further evaluation  Code Status:Full Code ( noted to be DNR from last admission 12/2011. However DNR form not with pt today)       Shanda Howells MD  Pager: [phone]

## 2013-09-18 NOTE — ED Notes (Signed)
Report attempted x2

## 2013-09-18 NOTE — ED Notes (Signed)
Pt arrives [facility] from [facility]. Per EMS pt fell out of bed 3/21. Portable x-ray was performed today at the facility. Read as subtle linear lucency in left intertrochanteric femur, with a greater trochanteric fragment.

## 2013-09-18 NOTE — Progress Notes (Signed)
Sub cut heparin ok for now but hold 8 hours before surgery - plan on or after 6 pm tuesday

## 2013-09-18 NOTE — ED Notes (Signed)
Report attempted x 1

## 2013-09-18 NOTE — Consult Note (Signed)
Reason for Consult: Hip pain Referring Physician: [name] is an 80 y.o. female.  HPI: [name] is an 80 year old female with left hip pain. She describes falling 2 weeks ago in his been unable to ambulate since that time. She was brought in to the emergency room tonight and was found to have a left hip fracture. She's had a right hip fracture treated at a hospital in 1999. The patient also notably had  deep vein thrombosis in 2013. Patient does have dementia but can describe to some degree the events surrounding this fall.  Past Medical History  Diagnosis Date  . Hypertension   . Reflux   . Right elbow pain     OTIF  . Dementia   . Depression   . Osteoarthritis   . Pancreatitis 11/2007    [facility]  . Angiomyolipoma of kidney     right  . Ulcerative esophagitis 12/05/2007    hx elevated gastrin, severe on EGD by Dr Gala Romney , h pylori negative  . Hiatal hernia   . S/P colonoscopy 2009    pt reports normal by Dr Arnoldo Morale  . Diabetes mellitus   . Anemia   . Hyponatremia   . Cellulitis   . Esophagitis   . Peptic ulcer   . Insomnia   . Sepsis(995.91)   . Respiratory failure   . Sepsis(995.91)   . Clostridium difficile infection   . Esophagitis   . Depression   . Encephalitis   . Urinary tract infection   . Bacteremia   . 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 Perforated prepyloric gastric ulcer s/p antrectomy with Billroth II reconstruction 01/30/2011    Past Surgical History  Procedure Laterality Date  . Orif right hip  1999    [facility]  . Umbilical hernia repair  90 months old    [place]  . Esophagogastroduodenoscopy  01/28/2011    Procedure: ESOPHAGOGASTRODUODENOSCOPY (EGD);  Surgeon: Dorothyann Peng, MD;  [facility];  Service: Endoscopy;  Laterality: N/A;  . Colonoscopy  01/28/2011    Procedure: COLONOSCOPY;  Surgeon: Dorothyann Peng, MD;  [facility];  Service: Endoscopy;  Laterality: N/A;  . Laparotomy   02/04/2011    Procedure: EXPLORATORY LAPAROTOMY;  Surgeon: Donato Heinz;  Location: [facility];  Service: General;  Laterality: N/A;  . Laparotomy  05/07/2011    Procedure: EXPLORATORY LAPAROTOMY;  Surgeon: Adin Hector, MD;  Location: [place];  Service: General;  Laterality: N/A;  lysis of adhesions  . Gastrostomy  05/07/2011    Procedure: GASTROSTOMY;  Surgeon: Adin Hector, MD;  Location: [place];  Service: General;  Laterality: N/A;  g-tube / j -tube placement  . Gastrectomy  05/07/2011    Procedure: GASTRECTOMY;  Surgeon: Adin Hector, MD;  Location: [place];  Service: General;  Laterality: N/A;  Partial gastrectomy  . Cholecystectomy  05/07/2011    Procedure: CHOLECYSTECTOMY;  Surgeon: Adin Hector, MD;  Location: [place];  Service: General;  Laterality: N/A;  open  . Tee without cardioversion  10/21/2011    Procedure: TRANSESOPHAGEAL ECHOCARDIOGRAM (TEE);  Surgeon: Josue Hector, MD;  Location: [facility] END[facility];  Service: Cardiovascular;  Laterality: N/A;    Family History  Problem Relation Age of Onset  . Cancer Mother     pelvic     Social History:  reports that she has never smoked. Her smokeless tobacco use includes Chew. She reports that she does  not drink alcohol or use illicit drugs.  Allergies:  Allergies  Allergen Reactions  . Other Swelling    Pinto beans cause swelling and hives  . Penicillins Swelling    Medications: I have reviewed the patient's current medications.  Results for orders placed during the hospital encounter of 09/18/13 (from the past 48 hour(s))  CBC WITH DIFFERENTIAL     Status: Abnormal   Collection Time    09/18/13  4:30 PM      Result Value Ref Range   WBC 5.4  4.0 - 10.5 K/uL   RBC 2.99 (*) 3.87 - 5.11 MIL/uL   Hemoglobin 8.7 (*) 12.0 - 15.0 g/dL   HCT 26.8 (*) 36.0 - 46.0 %   MCV 89.6  78.0 - 100.0 fL   MCH 29.1  26.0 - 34.0 pg   MCHC 32.5  30.0 - 36.0 g/dL   RDW 18.0 (*) 11.5 - 15.5 %   Platelets 163  150 - 400 K/uL   Neutrophils  Relative % 71  43 - 77 %   Neutro Abs 3.8  1.7 - 7.7 K/uL   Lymphocytes Relative 20  12 - 46 %   Lymphs Abs 1.1  0.7 - 4.0 K/uL   Monocytes Relative 9  3 - 12 %   Monocytes Absolute 0.5  0.1 - 1.0 K/uL   Eosinophils Relative 0  0 - 5 %   Eosinophils Absolute 0.0  0.0 - 0.7 K/uL   Basophils Relative 0  0 - 1 %   Basophils Absolute 0.0  0.0 - 0.1 K/uL  BASIC METABOLIC PANEL     Status: Abnormal   Collection Time    09/18/13  4:30 PM      Result Value Ref Range   Sodium 140  137 - 147 mEq/L   Potassium 3.3 (*) 3.7 - 5.3 mEq/L   Chloride 101  96 - 112 mEq/L   CO2 27  19 - 32 mEq/L   Glucose, Bld 102 (*) 70 - 99 mg/dL   BUN 14  6 - 23 mg/dL   Creatinine, Ser 0.59  0.50 - 1.10 mg/dL   Calcium 7.5 (*) 8.4 - 10.5 mg/dL   GFR calc non Af Amer 84 (*) >90 mL/min   GFR calc Af Amer >90  >90 mL/min   Comment: (NOTE)     The eGFR has been calculated using the CKD EPI equation.     This calculation has not been validated in all clinical situations.     eGFR's persistently <90 mL/min signify possible Chronic Kidney     Disease.  RETICULOCYTES     Status: Abnormal   Collection Time    09/18/13  8:02 PM      Result Value Ref Range   Retic Ct Pct 2.6  0.4 - 3.1 %   RBC. 2.85 (*) 3.87 - 5.11 MIL/uL   Retic Count, Manual 74.1  19.0 - 186.0 K/uL  TYPE AND SCREEN     Status: None   Collection Time    09/18/13  8:13 PM      Result Value Ref Range   ABO/RH(D) A POS     Antibody Screen PENDING     Sample Expiration 09/21/2013      Dg Chest 2 View  09/18/2013   CLINICAL DATA:  Esophagitis in hiatal hernia  EXAM: CHEST  2 VIEW  COMPARISON:  DG CHEST 1V PORT dated 01/10/2012  FINDINGS: Normal cardiac silhouette with ectatic aorta. No effusion, infiltrate, pneumothorax.  Chronic elevation of the left hemidiaphragm.  IMPRESSION: No acute cardiopulmonary process.   Electronically Signed   By: Suzy Bouchard M.D.   On: 09/18/2013 17:56   Dg Hip Complete Left  09/18/2013   CLINICAL DATA:  Left hip  pain, fall 6 days ago  EXAM: LEFT HIP - COMPLETE 2+ VIEW  COMPARISON:  None.  FINDINGS: Five views of left hip submitted. There is diffuse osteopenia. There is right hip prosthesis in anatomic alignment. There is poorly visualized left femur fracture in intertrochanteric region. On the frontal view there is no definite cortical break through medially. However on the frog view there is a fracture line with cortical step-off medially. There is displacement of greater femoral trochanter.  IMPRESSION: Fracture of proximal left femur as described above. Limited study by diffuse osteopenia. Right hip prosthesis in anatomic alignment   Electronically Signed   By: Lahoma Crocker M.D.   On: 09/18/2013 18:06    Review of Systems  Constitutional: Negative.   HENT: Negative.   Respiratory: Negative.   Cardiovascular: Negative.   Musculoskeletal: Positive for joint pain.  Skin: Negative.     Blood pressure 107/57, pulse 75, temperature 98.7 F (37.1 C), temperature source Oral, resp. rate 16, height _0  (1.676 m), weight 51.8 kg (114 lb 3.2 oz), SpO2 100.00%. Physical Exam  Constitutional: She appears well-developed.  HENT:  Head: Normocephalic.  Eyes: Pupils are equal, round, and reactive to light.  Neck: Normal range of motion.  Cardiovascular: Normal rate.   Respiratory: Effort normal.  Neurological: She is alert.  Skin: Skin is warm.  Psychiatric: She has a normal mood and affect.   patient has palpable pedal pulses bilaterally with ankle dorsiflexion observed. She is pain with range of motion the left hip. No effusion crepitus or swelling around the left or right ankle or left or right knee  Assessment/Plan: Impression is left hip fracture intertrochanteric type and an angle toward patient plan is for intramedullary hip screw. Risk and benefits discussed with the patient who understands and agrees to proceed. Also talked with her next of kin in order to facilitate consent. All questions answered.  Patient's hemoglobin is slightly low at 8.7 and this is likely she will need a transfusion at some time in the perioperative period. We'll also need to begin DVT prophylaxis and after surgery. Anticipate surgery either tonight or car depending on outside factors.  Coen Miyasato SCOTT 09/18/2013, 10:04 PM

## 2013-09-19 ENCOUNTER — Inpatient Hospital Stay (HOSPITAL_COMMUNITY): Payer: Medicare HMO

## 2013-09-19 ENCOUNTER — Encounter (HOSPITAL_COMMUNITY): Payer: Self-pay | Admitting: General Practice

## 2013-09-19 ENCOUNTER — Encounter (HOSPITAL_COMMUNITY): Payer: Medicare HMO | Admitting: Anesthesiology

## 2013-09-19 ENCOUNTER — Encounter (HOSPITAL_COMMUNITY)
Admission: EM | Disposition: A | Discharge status: Skilled Nursing Facility | Payer: Self-pay | Source: Home / Self Care | Attending: Internal Medicine

## 2013-09-19 ENCOUNTER — Inpatient Hospital Stay (HOSPITAL_COMMUNITY): Payer: Medicare HMO | Admitting: Anesthesiology

## 2013-09-19 DIAGNOSIS — F17200 Nicotine dependence, unspecified, uncomplicated: Secondary | ICD-10-CM | POA: Diagnosis present

## 2013-09-19 DIAGNOSIS — F0390 Unspecified dementia without behavioral disturbance: Secondary | ICD-10-CM | POA: Diagnosis present

## 2013-09-19 DIAGNOSIS — E871 Hypo-osmolality and hyponatremia: Secondary | ICD-10-CM | POA: Diagnosis not present

## 2013-09-19 DIAGNOSIS — K5730 Diverticulosis of large intestine without perforation or abscess without bleeding: Secondary | ICD-10-CM | POA: Diagnosis present

## 2013-09-19 DIAGNOSIS — R32 Unspecified urinary incontinence: Secondary | ICD-10-CM | POA: Diagnosis present

## 2013-09-19 DIAGNOSIS — I10 Essential (primary) hypertension: Secondary | ICD-10-CM | POA: Diagnosis present

## 2013-09-19 DIAGNOSIS — M25559 Pain in unspecified hip: Secondary | ICD-10-CM | POA: Diagnosis present

## 2013-09-19 DIAGNOSIS — Z9089 Acquired absence of other organs: Secondary | ICD-10-CM | POA: Diagnosis not present

## 2013-09-19 DIAGNOSIS — G4700 Insomnia, unspecified: Secondary | ICD-10-CM | POA: Diagnosis present

## 2013-09-19 DIAGNOSIS — K219 Gastro-esophageal reflux disease without esophagitis: Secondary | ICD-10-CM | POA: Diagnosis present

## 2013-09-19 DIAGNOSIS — E440 Moderate protein-calorie malnutrition: Secondary | ICD-10-CM | POA: Diagnosis present

## 2013-09-19 DIAGNOSIS — Z794 Long term (current) use of insulin: Secondary | ICD-10-CM | POA: Diagnosis not present

## 2013-09-19 DIAGNOSIS — G8929 Other chronic pain: Secondary | ICD-10-CM | POA: Diagnosis present

## 2013-09-19 DIAGNOSIS — E876 Hypokalemia: Secondary | ICD-10-CM | POA: Diagnosis not present

## 2013-09-19 DIAGNOSIS — W010XXA Fall on same level from slipping, tripping and stumbling without subsequent striking against object, initial encounter: Secondary | ICD-10-CM | POA: Diagnosis present

## 2013-09-19 DIAGNOSIS — Z880 Allergy status to penicillin: Secondary | ICD-10-CM | POA: Diagnosis not present

## 2013-09-19 DIAGNOSIS — Y9210 Unspecified residential institution as the place of occurrence of the external cause: Secondary | ICD-10-CM | POA: Diagnosis present

## 2013-09-19 DIAGNOSIS — D638 Anemia in other chronic diseases classified elsewhere: Secondary | ICD-10-CM | POA: Diagnosis present

## 2013-09-19 DIAGNOSIS — Z681 Body mass index (BMI) 19 or less, adult: Secondary | ICD-10-CM | POA: Diagnosis not present

## 2013-09-19 DIAGNOSIS — S72009A Fracture of unspecified part of neck of unspecified femur, initial encounter for closed fracture: Secondary | ICD-10-CM | POA: Diagnosis present

## 2013-09-19 DIAGNOSIS — E559 Vitamin D deficiency, unspecified: Secondary | ICD-10-CM | POA: Diagnosis present

## 2013-09-19 DIAGNOSIS — S72143A Displaced intertrochanteric fracture of unspecified femur, initial encounter for closed fracture: Secondary | ICD-10-CM | POA: Diagnosis present

## 2013-09-19 DIAGNOSIS — IMO0001 Reserved for inherently not codable concepts without codable children: Secondary | ICD-10-CM | POA: Diagnosis present

## 2013-09-19 DIAGNOSIS — F3289 Other specified depressive episodes: Secondary | ICD-10-CM | POA: Diagnosis present

## 2013-09-19 DIAGNOSIS — F329 Major depressive disorder, single episode, unspecified: Secondary | ICD-10-CM | POA: Diagnosis present

## 2013-09-19 DIAGNOSIS — E785 Hyperlipidemia, unspecified: Secondary | ICD-10-CM | POA: Diagnosis present

## 2013-09-19 DIAGNOSIS — D62 Acute posthemorrhagic anemia: Secondary | ICD-10-CM | POA: Diagnosis not present

## 2013-09-19 DIAGNOSIS — Z79899 Other long term (current) drug therapy: Secondary | ICD-10-CM | POA: Diagnosis not present

## 2013-09-19 DIAGNOSIS — Z86718 Personal history of other venous thrombosis and embolism: Secondary | ICD-10-CM | POA: Diagnosis not present

## 2013-09-19 HISTORY — PX: FEMUR IM NAIL: SHX1597

## 2013-09-19 LAB — COMPREHENSIVE METABOLIC PANEL
ALBUMIN: 2.2 g/dL — AB (ref 3.5–5.2)
ALT: 14 U/L (ref 0–35)
AST: 16 U/L (ref 0–37)
Alkaline Phosphatase: 214 U/L — ABNORMAL HIGH (ref 39–117)
BUN: 10 mg/dL (ref 6–23)
CALCIUM: 7.2 mg/dL — AB (ref 8.4–10.5)
CO2: 26 mEq/L (ref 19–32)
Chloride: 105 mEq/L (ref 96–112)
Creatinine, Ser: 0.48 mg/dL — ABNORMAL LOW (ref 0.50–1.10)
GFR calc Af Amer: >90 mL/min (ref >90)
GFR calc non Af Amer: >90 mL/min (ref >90)
Glucose, Bld: 91 mg/dL (ref 70–99)
Potassium: 3.4 mEq/L — ABNORMAL LOW (ref 3.7–5.3)
SODIUM: 141 meq/L (ref 137–147)
TOTAL PROTEIN: 5.3 g/dL — AB (ref 6.0–8.3)
Total Bilirubin: 0.3 mg/dL (ref 0.3–1.2)

## 2013-09-19 LAB — GLUCOSE, CAPILLARY
GLUCOSE-CAPILLARY: 103 mg/dL — AB (ref 70–99)
Glucose-Capillary: 100 mg/dL — ABNORMAL HIGH (ref 70–99)
Glucose-Capillary: 97 mg/dL (ref 70–99)
Glucose-Capillary: 94 mg/dL (ref 70–99)
Glucose-Capillary: 101 mg/dL — ABNORMAL HIGH (ref 70–99)

## 2013-09-19 LAB — CBC WITH DIFFERENTIAL/PLATELET
Basophils Absolute: 0 10*3/uL (ref 0.0–0.1)
Basophils Relative: 0 % (ref 0–1)
EOS PCT: 1 % (ref 0–5)
Eosinophils Absolute: 0 10*3/uL (ref 0.0–0.7)
HCT: 24 % — ABNORMAL LOW (ref 36.0–46.0)
Hemoglobin: 8 g/dL — ABNORMAL LOW (ref 12.0–15.0)
Lymphocytes Relative: 30 % (ref 12–46)
Lymphs Abs: 1.2 10*3/uL (ref 0.7–4.0)
MCH: 29.7 pg (ref 26.0–34.0)
MCHC: 33.3 g/dL (ref 30.0–36.0)
MCV: 89.2 fL (ref 78.0–100.0)
Monocytes Absolute: 0.4 10*3/uL (ref 0.1–1.0)
Monocytes Relative: 10 % (ref 3–12)
NEUTROS PCT: 60 % (ref 43–77)
Neutro Abs: 2.4 10*3/uL (ref 1.7–7.7)
Platelets: 144 10*3/uL — ABNORMAL LOW (ref 150–400)
RBC: 2.69 MIL/uL — ABNORMAL LOW (ref 3.87–5.11)
RDW: 18.1 % — AB (ref 11.5–15.5)
WBC: 4 10*3/uL (ref 4.0–10.5)

## 2013-09-19 LAB — VITAMIN D 25 HYDROXY (VIT D DEFICIENCY, FRACTURES): Vit D, 25-Hydroxy: <10 ng/mL — ABNORMAL LOW (ref 30–89)

## 2013-09-19 LAB — IRON AND TIBC
Iron: 19 ug/dL — ABNORMAL LOW (ref 42–135)
SATURATION RATIOS: 11 % — AB (ref 20–55)
TIBC: 172 ug/dL — AB (ref 250–470)
UIBC: 153 ug/dL (ref 125–400)

## 2013-09-19 LAB — HEMOGLOBIN A1C
Hgb A1c MFr Bld: 5.5 % (ref <5.7)
Mean Plasma Glucose: 111 mg/dL (ref <117)

## 2013-09-19 LAB — FOLATE: Folate: 16.7 ng/mL

## 2013-09-19 LAB — VITAMIN B12: VITAMIN B 12: 413 pg/mL (ref 211–911)

## 2013-09-19 LAB — FERRITIN: Ferritin: 24 ng/mL (ref 10–291)

## 2013-09-19 LAB — PREPARE RBC (CROSSMATCH)

## 2013-09-19 LAB — PTH, INTACT AND CALCIUM
CALCIUM TOTAL (PTH): 7.1 mg/dL — AB (ref 8.4–10.5)
PTH: 363 pg/mL — AB (ref 14.0–72.0)

## 2013-09-19 SURGERY — INSERTION, INTRAMEDULLARY ROD, FEMUR
Anesthesia: General | Laterality: Left

## 2013-09-19 MED ORDER — FENTANYL CITRATE 0.05 MG/ML IJ SOLN
INTRAMUSCULAR | Status: AC
Start: 2013-09-19 — End: 2013-09-19
  Filled 2013-09-19: qty 5

## 2013-09-19 MED ORDER — NEOSTIGMINE METHYLSULFATE 1 MG/ML IJ SOLN
INTRAMUSCULAR | Status: AC
  Filled 2013-09-19: qty 10

## 2013-09-19 MED ORDER — SODIUM CHLORIDE 0.9 % IR SOLN
Status: DC | PRN
  Administered 2013-09-19: 1000 mL

## 2013-09-19 MED ORDER — GLYCOPYRROLATE 0.2 MG/ML IJ SOLN
INTRAMUSCULAR | Status: AC
  Filled 2013-09-19: qty 4

## 2013-09-19 MED ORDER — FENTANYL CITRATE 0.05 MG/ML IJ SOLN
INTRAMUSCULAR | Status: DC | PRN
  Administered 2013-09-19 (×2): 75 ug via INTRAVENOUS

## 2013-09-19 MED ORDER — FENTANYL CITRATE 0.05 MG/ML IJ SOLN
INTRAMUSCULAR | Status: AC
  Filled 2013-09-19: qty 2

## 2013-09-19 MED ORDER — NEOSTIGMINE METHYLSULFATE 1 MG/ML IJ SOLN
INTRAMUSCULAR | Status: DC | PRN
  Administered 2013-09-19: 4 mg via INTRAVENOUS

## 2013-09-19 MED ORDER — PROPOFOL 10 MG/ML IV BOLUS
INTRAVENOUS | Status: DC | PRN
  Administered 2013-09-19: 100 mg via INTRAVENOUS

## 2013-09-19 MED ORDER — GLYCOPYRROLATE 0.2 MG/ML IJ SOLN
INTRAMUSCULAR | Status: DC | PRN
  Administered 2013-09-19: 0.2 mg via INTRAVENOUS
  Administered 2013-09-19: .8 mg via INTRAVENOUS

## 2013-09-19 MED ORDER — ROCURONIUM BROMIDE 100 MG/10ML IV SOLN
INTRAVENOUS | Status: DC | PRN
Start: 2013-09-19 — End: 2013-09-19
  Administered 2013-09-19: 35 mg via INTRAVENOUS

## 2013-09-19 MED ORDER — PHENYLEPHRINE HCL 10 MG/ML IJ SOLN
INTRAMUSCULAR | Status: AC
  Filled 2013-09-19: qty 1

## 2013-09-19 MED ORDER — CEFAZOLIN SODIUM-DEXTROSE 2-3 GM-% IV SOLR
INTRAVENOUS | Status: AC
  Filled 2013-09-19: qty 50

## 2013-09-19 MED ORDER — CEFAZOLIN SODIUM-DEXTROSE 2-3 GM-% IV SOLR
INTRAVENOUS | Status: DC | PRN
  Administered 2013-09-19: 2 g via INTRAVENOUS

## 2013-09-19 MED ORDER — FENTANYL CITRATE 0.05 MG/ML IJ SOLN
25.0000 ug | INTRAMUSCULAR | Status: DC | PRN
  Administered 2013-09-19 (×2): 25 ug via INTRAVENOUS
  Administered 2013-09-19 (×2): 50 ug via INTRAVENOUS

## 2013-09-19 MED ORDER — SODIUM CHLORIDE 0.9 % IV SOLN
INTRAVENOUS | Status: DC | PRN
  Administered 2013-09-19: 22:00:00 via INTRAVENOUS

## 2013-09-19 MED ORDER — LORAZEPAM 0.5 MG PO TABS
0.5000 mg | ORAL_TABLET | Freq: Four times a day (QID) | ORAL | Status: DC
  Administered 2013-09-20 – 2013-09-22 (×12): 0.5 mg via ORAL
  Filled 2013-09-19 (×12): qty 1

## 2013-09-19 MED ORDER — LACTATED RINGERS IV SOLN
INTRAVENOUS | Status: DC | PRN
  Administered 2013-09-19: 21:00:00 via INTRAVENOUS

## 2013-09-19 MED ORDER — PROPOFOL 10 MG/ML IV BOLUS
INTRAVENOUS | Status: AC
  Filled 2013-09-19: qty 20

## 2013-09-19 MED ORDER — LIDOCAINE HCL (CARDIAC) 20 MG/ML IV SOLN
INTRAVENOUS | Status: DC | PRN
  Administered 2013-09-19: 60 mg via INTRAVENOUS

## 2013-09-19 MED ORDER — METOPROLOL TARTRATE 50 MG PO TABS: 50.0000 mg | ORAL_TABLET | Freq: Two times a day (BID) | ORAL | Status: DC

## 2013-09-19 SURGICAL SUPPLY — 52 items
BLADE SURG 15 STRL LF DISP TIS (BLADE) ×1 IMPLANT
BLADE SURG 15 STRL SS (BLADE)
BLADE SURG ROTATE 9660 (MISCELLANEOUS) IMPLANT
BNDG COHESIVE 6X5 TAN STRL LF (GAUZE/BANDAGES/DRESSINGS) ×2 IMPLANT
BNDG GAUZE ELAST 4 BULKY (GAUZE/BANDAGES/DRESSINGS) ×2 IMPLANT
COVER SURGICAL LIGHT HANDLE (MISCELLANEOUS) ×3 IMPLANT
DRAPE ORTHO SPLIT 77X108 STRL (DRAPES)
DRAPE PROXIMA HALF (DRAPES) IMPLANT
DRAPE STERI IOBAN 125X83 (DRAPES) ×3 IMPLANT
DRAPE SURG ORHT 6 SPLT 77X108 (DRAPES) IMPLANT
DRSG MEPILEX BORDER 4X4 (GAUZE/BANDAGES/DRESSINGS) ×2 IMPLANT
DRSG MEPILEX BORDER 4X8 (GAUZE/BANDAGES/DRESSINGS) ×3 IMPLANT
DRSG PAD ABDOMINAL 8X10 ST (GAUZE/BANDAGES/DRESSINGS) ×2 IMPLANT
DURAPREP 26ML APPLICATOR (WOUND CARE) ×3 IMPLANT
ELECT REM PT RETURN 9FT ADLT (ELECTROSURGICAL) ×3
ELECTRODE REM PT RTRN 9FT ADLT (ELECTROSURGICAL) ×1 IMPLANT
FACESHIELD LNG OPTICON STERILE (SAFETY) ×3 IMPLANT
GAUZE XEROFORM 5X9 LF (GAUZE/BANDAGES/DRESSINGS) ×3 IMPLANT
GLOVE BIO SURGEON STRL SZ8 (GLOVE) ×2 IMPLANT
GLOVE BIOGEL PI IND STRL 6 (GLOVE) IMPLANT
GLOVE BIOGEL PI IND STRL 8 (GLOVE) ×1 IMPLANT
GLOVE BIOGEL PI INDICATOR 6 (GLOVE) ×2
GLOVE BIOGEL PI INDICATOR 8 (GLOVE) ×4
GLOVE SURG ORTHO 8.0 STRL STRW (GLOVE) ×3 IMPLANT
GOWN STRL REUS W/ TWL LRG LVL3 (GOWN DISPOSABLE) ×1 IMPLANT
GOWN STRL REUS W/ TWL XL LVL3 (GOWN DISPOSABLE) IMPLANT
GOWN STRL REUS W/TWL LRG LVL3 (GOWN DISPOSABLE) ×6
GOWN STRL REUS W/TWL XL LVL3 (GOWN DISPOSABLE)
GUIDE PIN 3.2 LONG (PIN) ×2 IMPLANT
GUIDE PIN 3.2MM (MISCELLANEOUS) ×3
GUIDE PIN ORTH 343X3.2XBRAD (MISCELLANEOUS) IMPLANT
HIP SCREW SET (Screw) ×2 IMPLANT
KIT BASIN OR (CUSTOM PROCEDURE TRAY) ×3 IMPLANT
KIT ROOM TURNOVER OR (KITS) ×3 IMPLANT
MANIFOLD NEPTUNE II (INSTRUMENTS) ×3 IMPLANT
NAIL IMHS L 10X38 (Nail) ×2 IMPLANT
NS IRRIG 1000ML POUR BTL (IV SOLUTION) ×3 IMPLANT
PACK GENERAL/GYN (CUSTOM PROCEDURE TRAY) ×3 IMPLANT
PAD ARMBOARD 7.5X6 YLW CONV (MISCELLANEOUS) ×6 IMPLANT
PAD CAST 4YDX4 CTTN HI CHSV (CAST SUPPLIES) IMPLANT
PADDING CAST COTTON 4X4 STRL (CAST SUPPLIES) ×3
SCREW COMPRESSION (Screw) ×2 IMPLANT
SCREW LAG 90MM (Screw) ×3 IMPLANT
SCREW LAGSTD 90X21X12.7X9 (Screw) IMPLANT
SPONGE LAP 4X18 X RAY DECT (DISPOSABLE) IMPLANT
STAPLER VISISTAT 35W (STAPLE) ×3 IMPLANT
SUT ETHILON 2 0 FS 18 (SUTURE) IMPLANT
SUT VIC AB 2-0 CTB1 (SUTURE) ×5 IMPLANT
TAPE STRIPS DRAPE STRL (GAUZE/BANDAGES/DRESSINGS) IMPLANT
TOWEL OR 17X24 6PK STRL BLUE (TOWEL DISPOSABLE) ×3 IMPLANT
TOWEL OR 17X26 10 PK STRL BLUE (TOWEL DISPOSABLE) ×3 IMPLANT
WATER STERILE IRR 1000ML POUR (IV SOLUTION) ×3 IMPLANT

## 2013-09-19 NOTE — Progress Notes (Signed)
Dr. Marlou Sa notified of hgb 8.0

## 2013-09-19 NOTE — Anesthesia Procedure Notes (Signed)
Procedure Name: Intubation Date/Time: 09/19/2013 9:20 PM Performed by: Valetta Fuller Pre-anesthesia Checklist: Patient identified, Emergency Drugs available, Suction available and Patient being monitored Patient Re-evaluated:Patient Re-evaluated prior to inductionOxygen Delivery Method: Circle system utilized Preoxygenation: Pre-oxygenation with 100% oxygen Intubation Type: IV induction Ventilation: Mask ventilation without difficulty Laryngoscope Size: Miller and 2 Grade View: Grade I Tube type: Oral Tube size: 7.0 mm Number of attempts: 1 Airway Equipment and Method: Stylet Placement Confirmation: ETT inserted through vocal cords under direct vision,  positive ETCO2 and breath sounds checked- equal and bilateral Secured at: 22 cm Tube secured with: Tape Dental Injury: Teeth and Oropharynx as per pre-operative assessment

## 2013-09-19 NOTE — Anesthesia Preprocedure Evaluation (Signed)
Anesthesia Evaluation  Patient identified by MRN, date of birth, ID band Patient confused    Reviewed: Allergy & Precautions, Unable to perform ROS - Chart review only  Airway Mallampati: I  Neck ROM: Full    Dental   Pulmonary  breath sounds clear to auscultation        Cardiovascular hypertension, DVT Rhythm:Regular Rate:Normal     Neuro/Psych  Headaches, Anxiety Depression Dementia noted in chart  Neuromuscular disease    GI/Hepatic hiatal hernia, PUD,   Endo/Other  diabetes  Renal/GU Renal InsufficiencyRenal disease     Musculoskeletal   Abdominal   Peds  Hematology  (+) Blood dyscrasia, anemia ,   Anesthesia Other Findings   Reproductive/Obstetrics                           Anesthesia Physical Anesthesia Plan  ASA: IV  Anesthesia Plan: General   Post-op Pain Management:    Induction: Intravenous  Airway Management Planned: Oral ETT  Additional Equipment:   Intra-op Plan:   Post-operative Plan: Extubation in OR  Informed Consent: I have reviewed the patients History and Physical, chart, labs and discussed the procedure including the risks, benefits and alternatives for the proposed anesthesia with the patient or authorized representative who has indicated his/her understanding and acceptance.     Plan Discussed with:   Anesthesia Plan Comments:         Anesthesia Quick Evaluation

## 2013-09-19 NOTE — Progress Notes (Signed)
No call back from pt's daughter regarding getting consent as of this time.

## 2013-09-19 NOTE — Brief Op Note (Signed)
09/18/2013 - 09/19/2013  10:23 PM  PATIENT:  [name]  80 y.o. female  PRE-OPERATIVE DIAGNOSIS:  Left Hip Fracture  POST-OPERATIVE DIAGNOSIS:  Left Hip Fracture  PROCEDURE:  Procedure(s): INTRAMEDULLARY (IM) NAIL FEMORAL  SURGEON:  Surgeon(s): Meredith Pel, MD  ASSISTANT: none  ANESTHESIA:   general  EBL: 50 ml    Total I/O In: 835 [I.V.:500; Blood:335] Out: 500 [Urine:450; Blood:50]  BLOOD ADMINISTERED: none  DRAINS: none   LOCAL MEDICATIONS USED:  none  SPECIMEN:  No Specimen  COUNTS:  YES  TOURNIQUET:  * No tourniquets in log *  DICTATION: .Other Dictation: Dictation Number [id number]  PLAN OF CARE: Admit to inpatient   PATIENT DISPOSITION:  PACU - hemodynamically stable

## 2013-09-19 NOTE — Progress Notes (Addendum)
[facility] PROGRESS NOTE  [name] LZJ:[id number] DOB: 09/02/1931 DOA: 09/18/2013 PCP: Hollace Kinnier, DO  Interim history 80 year old female with a history of diabetes mellitus type 2, urinary incontinence, malnutrition, dementia,gastroileal fistula with short gut syndrome presenting with fall and L femur fracture. Per report, pt fell about 6 days prior to admission at nursing home. Pt states that she landed on her L hip. States that she has had L hip pain since this point. No bowel or bladder anesthesia. No distal numbness. X-rays in the ED showed an intertrochanteric left femur fracture. Orthopedics, Dr. Marlou Sa was consulted. The patient was noted to have a hemoglobin of 8.7. There was no signs of active bleeding.   Assessment/Plan: Left intertrochanteric femur fracture -appreciate Dr. Marlou Sa -plans noted for ORIF today -pain control -PT after ORIF Normocytic anemia -Baseline hemoglobin around 10 -Presenting hemoglobin 8.7 -Likely anemia of chronic disease -May also have had some blood loss due to femur fracture -Iron, TIBC, ferritin, serum B12, folate -partly dilution from fluids -check FOBT Diabetes mellitus type 2 -Hemoglobin A1c 5.5 -Discontinue CBGs and ISS -pt was only on SSI at [facility] Hypertension  -Elevated blood pressure partly due to pain  -Restart metoprolol tartrate  -Discontinue clonidine as the patient was only getting 0.1 mg daily  -hold amlodipine and monitor BP Hypokalemia  -Replete  -Check magnesium   Family Communication:   Pt at beside Disposition Plan:   SNF when medically stable       Procedures/Studies: Dg Chest 2 View  09/18/2013   CLINICAL DATA:  Esophagitis in hiatal hernia  EXAM: CHEST  2 VIEW  COMPARISON:  DG CHEST 1V PORT dated 01/10/2012  FINDINGS: Normal cardiac silhouette with ectatic aorta. No effusion, infiltrate, pneumothorax. Chronic elevation of the left hemidiaphragm.  IMPRESSION: No acute cardiopulmonary process.    Electronically Signed   By: Suzy Bouchard M.D.   On: 09/18/2013 17:56   Dg Hip Complete Left  09/18/2013   CLINICAL DATA:  Left hip pain, fall 6 days ago  EXAM: LEFT HIP - COMPLETE 2+ VIEW  COMPARISON:  None.  FINDINGS: Five views of left hip submitted. There is diffuse osteopenia. There is right hip prosthesis in anatomic alignment. There is poorly visualized left femur fracture in intertrochanteric region. On the frontal view there is no definite cortical break through medially. However on the frog view there is a fracture line with cortical step-off medially. There is displacement of greater femoral trochanter.  IMPRESSION: Fracture of proximal left femur as described above. Limited study by diffuse osteopenia. Right hip prosthesis in anatomic alignment   Electronically Signed   By: Lahoma Crocker M.D.   On: 09/18/2013 18:06         Subjective: Patient complains of left ear pain. Denies fevers, chills, chest discomfort or shortness breath, nausea, vomiting, diarrhea, vomiting, dysuria.   Objective: Filed Vitals:   09/19/13 1207 09/19/13 1304 09/19/13 1406 09/19/13 1436  BP: 147/69 139/67 144/67 139/71  Pulse: 71 70 66 72  Temp: 98.6 F (37 C) 98.4 F (36.9 C) 98.2 F (36.8 C) 97.9 F (36.6 C)  TempSrc:  Oral Oral Oral  Resp: 20 18 20 22   Height:      Weight:      SpO2:  100% 99% 100%    Intake/Output Summary (Last 24 hours) at 09/19/13 1926 Last data filed at 09/19/13 1153  Gross per 24 hour  Intake 721.25 ml  Output      0 ml  Net 721.25 ml  Weight change:  Exam:   General:  Pt is alert, follows commands appropriately, not in acute distress  HEENT: No icterus, No thrush,  [place]/AT  Cardiovascular: RRR, S1/S2, no rubs, no gallops  Respiratory: CTA bilaterally, no wheezing, no crackles, no rhonchi  Abdomen: Soft/+BS, non tender, non distended, no guarding  Extremities: trace LE edema, No lymphangitis, No petechiae, No rashes, no synovitis  Data Reviewed: Basic  Metabolic Panel:  Recent Labs Lab 09/18/13 1630 09/18/13 2002 09/19/13 0440  NA 140  --  141  K 3.3*  --  3.4*  CL 101  --  105  CO2 27  --  26  GLUCOSE 102*  --  91  BUN 14  --  10  CREATININE 0.59  --  0.48*  CALCIUM 7.5* 7.1* 7.2*   Liver Function Tests:  Recent Labs Lab 09/19/13 0440  AST 16  ALT 14  ALKPHOS 214*  BILITOT 0.3  PROT 5.3*  ALBUMIN 2.2*   No results found for this basename: LIPASE, AMYLASE,  in the last 168 hours No results found for this basename: AMMONIA,  in the last 168 hours CBC:  Recent Labs Lab 09/18/13 1630 09/19/13 0440  WBC 5.4 4.0  NEUTROABS 3.8 2.4  HGB 8.7* 8.0*  HCT 26.8* 24.0*  MCV 89.6 89.2  PLT 163 144*   Cardiac Enzymes: No results found for this basename: CKTOTAL, CKMB, CKMBINDEX, TROPONINI,  in the last 168 hours BNP: No components found with this basename: POCBNP,  CBG:  Recent Labs Lab 09/18/13 2356 09/19/13 0458 09/19/13 0741 09/19/13 1145 09/19/13 1536  GLUCAP 106* 100* 97 94 101*    Recent Results (from the past 240 hour(s))  SURGICAL PCR SCREEN     Status: None   Collection Time    09/18/13  9:02 PM      Result Value Ref Range Status   MRSA, PCR NEGATIVE  NEGATIVE Final   Staphylococcus aureus NEGATIVE  NEGATIVE Final   Comment:            The Xpert SA Assay (FDA     approved for NASAL specimens     in patients over 54 years of age),     is one component of     a comprehensive surveillance     program.  Test performance has     been validated by [employer] for patients greater     than or equal to 67 year old.     It is not intended     to diagnose infection nor to     guide or monitor treatment.     Scheduled Meds: . darifenacin  15 mg Oral Daily  . gabapentin  100 mg Oral Daily  . insulin aspart  0-9 Units Subcutaneous 6 times per day  . sertraline  150 mg Oral Daily   Continuous Infusions: . 0.9 % NaCl with KCl 40 mEq / L 75 mL/hr at 09/18/13 2343     Adetokunbo Mccadden, DO  [facility] Pager [phone]  If 7PM-7AM, please contact night-coverage [email or web address] Password [email or web address] 09/19/2013, 7:26 PM   LOS: 1 day

## 2013-09-19 NOTE — Clinical Social Work Note (Signed)
CSW received call from admissions liaison with [facility] regarding pt being a resident at their facility. CSW to follow-up with pt post-surgery to complete assessment and discuss discharge disposition to [facility].   *Barriers to discharge* Pt is under [facility], which requires a SNF authorization prior to discharge to SNF. CSW will have to submit clinical information [including PT/OT] to receive SNF authorization.  Pati Gallo, [place] Social Worker [phone]

## 2013-09-19 NOTE — Transfer of Care (Signed)
Immediate Anesthesia Transfer of Care Note  Patient: [name]  Procedure(s) Performed: Procedure(s): INTRAMEDULLARY (IM) NAIL FEMORAL (Left)  Patient Location: PACU  Anesthesia Type:General  Level of Consciousness: awake and patient cooperative  Airway & Oxygen Therapy: Patient Spontanous Breathing and Patient connected to nasal cannula oxygen  Post-op Assessment: Report given to PACU RN, Post -op Vital signs reviewed and stable and Patient moving all extremities X 4  Post vital signs: Reviewed and stable  Complications: No apparent anesthesia complications

## 2013-09-19 NOTE — Anesthesia Postprocedure Evaluation (Signed)
  Anesthesia Post-op Note  Patient: [name]  Procedure(s) Performed: Procedure(s): INTRAMEDULLARY (IM) NAIL FEMORAL (Left)  Patient Location: PACU  Anesthesia Type:General  Level of Consciousness: awake and sedated  Airway and Oxygen Therapy: Patient Spontanous Breathing  Post-op Pain: mild  Post-op Assessment: Post-op Vital signs reviewed  Post-op Vital Signs: stable  Complications: No apparent anesthesia complications

## 2013-09-19 NOTE — Progress Notes (Signed)
UR completed 

## 2013-09-20 ENCOUNTER — Encounter (HOSPITAL_COMMUNITY): Payer: Self-pay | Admitting: Orthopedic Surgery

## 2013-09-20 DIAGNOSIS — I10 Essential (primary) hypertension: Secondary | ICD-10-CM

## 2013-09-20 DIAGNOSIS — D638 Anemia in other chronic diseases classified elsewhere: Secondary | ICD-10-CM

## 2013-09-20 DIAGNOSIS — R079 Chest pain, unspecified: Secondary | ICD-10-CM

## 2013-09-20 DIAGNOSIS — F329 Major depressive disorder, single episode, unspecified: Secondary | ICD-10-CM

## 2013-09-20 DIAGNOSIS — D649 Anemia, unspecified: Secondary | ICD-10-CM

## 2013-09-20 DIAGNOSIS — F3289 Other specified depressive episodes: Secondary | ICD-10-CM

## 2013-09-20 DIAGNOSIS — S7290XA Unspecified fracture of unspecified femur, initial encounter for closed fracture: Secondary | ICD-10-CM

## 2013-09-20 DIAGNOSIS — E118 Type 2 diabetes mellitus with unspecified complications: Secondary | ICD-10-CM

## 2013-09-20 DIAGNOSIS — F0390 Unspecified dementia without behavioral disturbance: Secondary | ICD-10-CM

## 2013-09-20 LAB — COMPREHENSIVE METABOLIC PANEL WITH GFR
ALT: 19 U/L (ref 0–35)
AST: 21 U/L (ref 0–37)
Albumin: 2.5 g/dL — ABNORMAL LOW (ref 3.5–5.2)
Alkaline Phosphatase: 223 U/L — ABNORMAL HIGH (ref 39–117)
BUN: 4 mg/dL — ABNORMAL LOW (ref 6–23)
CO2: 25 meq/L (ref 19–32)
Calcium: 7.7 mg/dL — ABNORMAL LOW (ref 8.4–10.5)
Chloride: 91 meq/L — ABNORMAL LOW (ref 96–112)
Creatinine, Ser: 0.4 mg/dL — ABNORMAL LOW (ref 0.50–1.10)
GFR calc Af Amer: >90 mL/min (ref >90)
GFR calc non Af Amer: >90 mL/min (ref >90)
Glucose, Bld: 104 mg/dL — ABNORMAL HIGH (ref 70–99)
Potassium: 4.2 meq/L (ref 3.7–5.3)
Sodium: 129 meq/L — ABNORMAL LOW (ref 137–147)
Total Bilirubin: 1.1 mg/dL (ref 0.3–1.2)
Total Protein: 6 g/dL (ref 6.0–8.3)

## 2013-09-20 LAB — RETICULOCYTES
RBC.: 3.97 MIL/uL (ref 3.87–5.11)
RETIC COUNT ABSOLUTE: 103.2 10*3/uL (ref 19.0–186.0)
Retic Ct Pct: 2.6 % (ref 0.4–3.1)

## 2013-09-20 LAB — CBC WITH DIFFERENTIAL/PLATELET
Basophils Absolute: 0 K/uL (ref 0.0–0.1)
Basophils Relative: 0 % (ref 0–1)
Eosinophils Absolute: 0 K/uL (ref 0.0–0.7)
Eosinophils Relative: 0 % (ref 0–5)
HCT: 33.3 % — ABNORMAL LOW (ref 36.0–46.0)
Hemoglobin: 10.9 g/dL — ABNORMAL LOW (ref 12.0–15.0)
Lymphocytes Relative: 13 % (ref 12–46)
Lymphs Abs: 0.7 K/uL (ref 0.7–4.0)
MCH: 27.5 pg (ref 26.0–34.0)
MCHC: 32.7 g/dL (ref 30.0–36.0)
MCV: 83.9 fL (ref 78.0–100.0)
Monocytes Absolute: 0.5 K/uL (ref 0.1–1.0)
Monocytes Relative: 9 % (ref 3–12)
Neutro Abs: 4.6 K/uL (ref 1.7–7.7)
Neutrophils Relative %: 79 % — ABNORMAL HIGH (ref 43–77)
Platelets: 157 K/uL (ref 150–400)
RBC: 3.97 MIL/uL (ref 3.87–5.11)
RDW: 19.1 % — ABNORMAL HIGH (ref 11.5–15.5)
WBC: 5.8 K/uL (ref 4.0–10.5)

## 2013-09-20 LAB — LIPID PANEL
CHOL/HDL RATIO: 1.9 ratio
CHOLESTEROL: 119 mg/dL (ref 0–200)
HDL: 63 mg/dL (ref >39)
LDL Cholesterol: 44 mg/dL (ref 0–99)
Triglycerides: 62 mg/dL (ref <150)
VLDL: 12 mg/dL (ref 0–40)

## 2013-09-20 LAB — GLUCOSE, CAPILLARY
GLUCOSE-CAPILLARY: 85 mg/dL (ref 70–99)
Glucose-Capillary: 110 mg/dL — ABNORMAL HIGH (ref 70–99)
Glucose-Capillary: 111 mg/dL — ABNORMAL HIGH (ref 70–99)
Glucose-Capillary: 120 mg/dL — ABNORMAL HIGH (ref 70–99)
Glucose-Capillary: 130 mg/dL — ABNORMAL HIGH (ref 70–99)
Glucose-Capillary: 135 mg/dL — ABNORMAL HIGH (ref 70–99)
Glucose-Capillary: 135 mg/dL — ABNORMAL HIGH (ref 70–99)

## 2013-09-20 LAB — MAGNESIUM: MAGNESIUM: 1.4 mg/dL — AB (ref 1.5–2.5)

## 2013-09-20 LAB — PROTIME-INR
INR: 1.05 (ref 0.00–1.49)
Prothrombin Time: 13.5 seconds (ref 11.6–15.2)

## 2013-09-20 LAB — IRON AND TIBC
Iron: 197 ug/dL — ABNORMAL HIGH (ref 42–135)
UIBC: <15 ug/dL — ABNORMAL LOW (ref 125–400)

## 2013-09-20 LAB — TROPONIN I

## 2013-09-20 LAB — FERRITIN: Ferritin: 46 ng/mL (ref 10–291)

## 2013-09-20 LAB — PRO B NATRIURETIC PEPTIDE: Pro B Natriuretic peptide (BNP): 1442 pg/mL — ABNORMAL HIGH (ref 0–450)

## 2013-09-20 LAB — VITAMIN B12: Vitamin B-12: 477 pg/mL (ref 211–911)

## 2013-09-20 MED ORDER — METOCLOPRAMIDE HCL 5 MG/ML IJ SOLN: 5.0000 mg | Freq: Three times a day (TID) | INTRAMUSCULAR | Status: DC | PRN

## 2013-09-20 MED ORDER — VITAMIN D3 25 MCG (1000 UNIT) PO TABS
2000.0000 [IU] | ORAL_TABLET | Freq: Every day | ORAL | Status: DC
  Administered 2013-09-20 – 2013-09-22 (×3): 2000 [IU] via ORAL
  Filled 2013-09-20 (×3): qty 2

## 2013-09-20 MED ORDER — PHENOL 1.4 % MT LIQD: 1.0000 | OROMUCOSAL | Status: DC | PRN

## 2013-09-20 MED ORDER — PANTOPRAZOLE SODIUM 40 MG PO TBEC
40.0000 mg | DELAYED_RELEASE_TABLET | Freq: Every day | ORAL | Status: DC
  Administered 2013-09-20 – 2013-09-22 (×3): 40 mg via ORAL
  Filled 2013-09-20 (×2): qty 1

## 2013-09-20 MED ORDER — METOPROLOL TARTRATE 50 MG PO TABS
50.0000 mg | ORAL_TABLET | Freq: Two times a day (BID) | ORAL | Status: DC
  Administered 2013-09-20 – 2013-09-22 (×6): 50 mg via ORAL
  Filled 2013-09-20 (×8): qty 1

## 2013-09-20 MED ORDER — METOCLOPRAMIDE HCL 5 MG PO TABS: 5.0000 mg | ORAL_TABLET | Freq: Three times a day (TID) | ORAL | Status: DC | PRN

## 2013-09-20 MED ORDER — CLONIDINE HCL 0.1 MG PO TABS
0.1000 mg | ORAL_TABLET | Freq: Every day | ORAL | Status: DC
  Administered 2013-09-20 – 2013-09-22 (×3): 0.1 mg via ORAL
  Filled 2013-09-20 (×3): qty 1

## 2013-09-20 MED ORDER — MENTHOL 3 MG MT LOZG: 1.0000 | LOZENGE | OROMUCOSAL | Status: DC | PRN

## 2013-09-20 MED ORDER — AMLODIPINE BESYLATE 5 MG PO TABS
5.0000 mg | ORAL_TABLET | Freq: Every day | ORAL | Status: DC
  Administered 2013-09-20: 5 mg via ORAL
  Filled 2013-09-20: qty 1

## 2013-09-20 MED ORDER — POTASSIUM CHLORIDE IN NACL 20-0.9 MEQ/L-% IV SOLN
INTRAVENOUS | Status: AC
  Administered 2013-09-20 (×2): via INTRAVENOUS
  Filled 2013-09-20 (×4): qty 1000

## 2013-09-20 MED ORDER — MORPHINE SULFATE 2 MG/ML IJ SOLN
0.5000 mg | INTRAMUSCULAR | Status: DC | PRN
  Administered 2013-09-20 – 2013-09-21 (×3): 0.5 mg via INTRAVENOUS
  Filled 2013-09-20 (×4): qty 1

## 2013-09-20 MED ORDER — ACETAMINOPHEN 325 MG PO TABS
650.0000 mg | ORAL_TABLET | Freq: Four times a day (QID) | ORAL | Status: DC | PRN
  Administered 2013-09-20 – 2013-09-22 (×3): 650 mg via ORAL
  Filled 2013-09-20 (×3): qty 2

## 2013-09-20 MED ORDER — VANCOMYCIN HCL IN DEXTROSE 1-5 GM/200ML-% IV SOLN
1000.0000 mg | Freq: Two times a day (BID) | INTRAVENOUS | Status: AC
  Administered 2013-09-20: 1000 mg via INTRAVENOUS
  Filled 2013-09-20: qty 200

## 2013-09-20 MED ORDER — ASPIRIN EC 325 MG PO TBEC
325.0000 mg | DELAYED_RELEASE_TABLET | Freq: Every day | ORAL | Status: DC
  Administered 2013-09-20 – 2013-09-22 (×3): 325 mg via ORAL
  Filled 2013-09-20 (×5): qty 1

## 2013-09-20 MED ORDER — ONDANSETRON HCL 4 MG/2ML IJ SOLN
4.0000 mg | Freq: Four times a day (QID) | INTRAMUSCULAR | Status: DC | PRN
  Administered 2013-09-20: 4 mg via INTRAVENOUS
  Filled 2013-09-20: qty 2

## 2013-09-20 MED ORDER — ACETAMINOPHEN 650 MG RE SUPP: 650.0000 mg | Freq: Four times a day (QID) | RECTAL | Status: DC | PRN

## 2013-09-20 MED ORDER — HYDROCODONE-ACETAMINOPHEN 5-325 MG PO TABS
1.0000 | ORAL_TABLET | Freq: Four times a day (QID) | ORAL | Status: DC | PRN
  Administered 2013-09-20 – 2013-09-22 (×10): 2 via ORAL
  Filled 2013-09-20 (×2): qty 2
  Filled 2013-09-20: qty 1
  Filled 2013-09-20 (×2): qty 2
  Filled 2013-09-20: qty 1
  Filled 2013-09-20 (×5): qty 2

## 2013-09-20 MED ORDER — ONDANSETRON HCL 4 MG PO TABS: 4.0000 mg | ORAL_TABLET | Freq: Four times a day (QID) | ORAL | Status: DC | PRN

## 2013-09-20 MED ORDER — FAMOTIDINE 10 MG PO TABS
10.0000 mg | ORAL_TABLET | Freq: Every day | ORAL | Status: DC
  Administered 2013-09-20 – 2013-09-22 (×3): 10 mg via ORAL
  Filled 2013-09-20 (×3): qty 1

## 2013-09-20 NOTE — Progress Notes (Signed)
Orthopedic Tech Progress Note Patient Details:  [name] 26-Sep-1931 [id number]      [name] 09/20/2013, 8:20 AM

## 2013-09-20 NOTE — Progress Notes (Signed)
[facility] PROGRESS NOTE  [name] NWG:[id number] DOB: 1932-04-02 DOA: 09/18/2013 PCP: Hollace Kinnier, DO  Interim history 80yo BF PMHx diabetes mellitus type 2, urinary incontinence, malnutrition, dementia,gastroileal fistula with short gut syndrome presenting with fall and L femur fracture. Per report, pt fell about 6 days prior to admission at nursing home. Pt states that she landed on her L hip. States that she has had L hip pain since this point. No bowel or bladder anesthesia. No distal numbness. X-rays in the ED showed an intertrochanteric left femur fracture. Orthopedics, Dr. Marlou Sa was consulted. The patient was noted to have a hemoglobin of 8.7. There was no signs of active bleeding. 3/25 states starting yesterday positive sharp substernal chest pain, positive SOB, negative radiation, last approximately 15 minutes then spontaneously resolves.   Assessment/Plan: Left intertrochanteric femur fracture -S/P ORIF 09/20/2013 Left hip   anemia chronic disease -Baseline hemoglobin around 10 -Presenting hemoglobin 8.7 -Likely anemia of chronic disease -May also have had some blood loss due to femur fracture -Iron, TIBC, ferritin, serum B12, folate consistent with anemia of chronic disease -partly dilution from fluids -check FOBT  Diabetes mellitus type 2 -Hemoglobin A1c 5.5 -Continue sensitive SSI  Hypertension  -Elevated blood pressure partly due to pain  -Discontinue Amlodipine 5 mg daily  -Continue clonidine 0.1 mg daily  -Continue metoprolol tartrate 50 mg BID -Will allow patient's BP to run permissively high secondary to her age and increased risk of fall with hypotension   HLD -Obtain lipid panel  Depression -Continue sertraline  Hypokalemia  -Replete   -Check magnesium   Hyponatremia -May be secondary to dehydration from surgery will gently hydrate vs SIADH secondary to acute illness  Chest pain -Obtain stat EKG -Obtain troponin x3 -Obtain  proBNP   Family Communication:   Pt at beside Disposition Plan:   SNF when medically stable   Consult Dr. Marcene Duos (orthopedic surgery)    Procedure 09/20/2013 Left hip intramedullary hip screw, Smith and Nephew 10 x 38 mm with a 95 mm compression screw and lag screw.     Antibiotic          Procedures/Studies: Dg Chest 2 View  09/18/2013   CLINICAL DATA:  Esophagitis in hiatal hernia  EXAM: CHEST  2 VIEW  COMPARISON:  DG CHEST 1V PORT dated 01/10/2012  FINDINGS: Normal cardiac silhouette with ectatic aorta. No effusion, infiltrate, pneumothorax. Chronic elevation of the left hemidiaphragm.  IMPRESSION: No acute cardiopulmonary process.   Electronically Signed   By: Suzy Bouchard M.D.   On: 09/18/2013 17:56   Dg Hip Complete Left  09/18/2013   CLINICAL DATA:  Left hip pain, fall 6 days ago  EXAM: LEFT HIP - COMPLETE 2+ VIEW  COMPARISON:  None.  FINDINGS: Five views of left hip submitted. There is diffuse osteopenia. There is right hip prosthesis in anatomic alignment. There is poorly visualized left femur fracture in intertrochanteric region. On the frontal view there is no definite cortical break through medially. However on the frog view there is a fracture line with cortical step-off medially. There is displacement of greater femoral trochanter.  IMPRESSION: Fracture of proximal left femur as described above. Limited study by diffuse osteopenia. Right hip prosthesis in anatomic alignment   Electronically Signed   By: Lahoma Crocker M.D.   On: 09/18/2013 18:06         Subjective: Patient complains of left ear pain. Denies fevers, chills, chest discomfort or shortness breath, nausea, vomiting, diarrhea, vomiting, dysuria.  Objective: Filed Vitals:   09/20/13 0800 09/20/13 1012 09/20/13 1200 09/20/13 1440  BP:  175/80  138/80  Pulse:  67  64  Temp:  98.6 F (37 C)  98.2 F (36.8 C)  TempSrc:  Oral  Oral  Resp: 18 18 16 16   Height:      Weight:      SpO2: 99%  100% 100% 98%    Intake/Output Summary (Last 24 hours) at 09/20/13 1450 Last data filed at 09/20/13 1400  Gross per 24 hour  Intake 2878.75 ml  Output   3850 ml  Net -971.25 ml   Weight change:  Exam:   General:  Pt is alert, follows commands appropriately, not in acute distress  HEENT: No icterus, No thrush,  [place]/AT  Cardiovascular: RRR, S1/S2, no rubs, no gallops  Respiratory: CTA bilaterally, no wheezing, no crackles, no rhonchi  Abdomen: Soft/+BS, non tender, non distended, no guarding  Extremities: trace LE edema, No lymphangitis, No petechiae, No rashes, no synovitis  Data Reviewed: Basic Metabolic Panel:  Recent Labs Lab 09/18/13 1630 09/18/13 2002 09/19/13 0440 09/20/13 0522  NA 140  --  141 129*  K 3.3*  --  3.4* 4.2  CL 101  --  105 91*  CO2 27  --  26 25  GLUCOSE 102*  --  91 104*  BUN 14  --  10 4*  CREATININE 0.59  --  0.48* 0.40*  CALCIUM 7.5* 7.1* 7.2* 7.7*  MG  --   --   --  1.4*   Liver Function Tests:  Recent Labs Lab 09/19/13 0440 09/20/13 0522  AST 16 21  ALT 14 19  ALKPHOS 214* 223*  BILITOT 0.3 1.1  PROT 5.3* 6.0  ALBUMIN 2.2* 2.5*   No results found for this basename: LIPASE, AMYLASE,  in the last 168 hours No results found for this basename: AMMONIA,  in the last 168 hours CBC:  Recent Labs Lab 09/18/13 1630 09/19/13 0440 09/20/13 0522  WBC 5.4 4.0 5.8  NEUTROABS 3.8 2.4 4.6  HGB 8.7* 8.0* 10.9*  HCT 26.8* 24.0* 33.3*  MCV 89.6 89.2 83.9  PLT 163 144* 157   Cardiac Enzymes: No results found for this basename: CKTOTAL, CKMB, CKMBINDEX, TROPONINI,  in the last 168 hours BNP: No components found with this basename: POCBNP,  CBG:  Recent Labs Lab 09/19/13 2244 09/20/13 0025 09/20/13 0338 09/20/13 0742 09/20/13 1149  GLUCAP 85 110* 130* 120* 135*    Recent Results (from the past 240 hour(s))  SURGICAL PCR SCREEN     Status: None   Collection Time    09/18/13  9:02 PM      Result Value Ref Range Status    MRSA, PCR NEGATIVE  NEGATIVE Final   Staphylococcus aureus NEGATIVE  NEGATIVE Final   Comment:            The Xpert SA Assay (FDA     approved for NASAL specimens     in patients over 32 years of age),     is one component of     a comprehensive surveillance     program.  Test performance has     been validated by [employer] for patients greater     than or equal to 3 year old.     It is not intended     to diagnose infection nor to     guide or monitor treatment.     Scheduled Meds: .  amLODipine  5 mg Oral Daily  . aspirin EC  325 mg Oral Q breakfast  . cholecalciferol  2,000 Units Oral Daily  . cloNIDine  0.1 mg Oral Daily  . darifenacin  15 mg Oral Daily  . famotidine  10 mg Oral Daily  . gabapentin  100 mg Oral Daily  . insulin aspart  0-9 Units Subcutaneous 6 times per day  . LORazepam  0.5 mg Oral QID  . metoprolol  50 mg Oral BID  . pantoprazole  40 mg Oral Daily  . sertraline  150 mg Oral Daily   Continuous Infusions: . 0.9 % NaCl with KCl 20 mEq / L 75 mL/hr at 09/20/13 0145     Ozan Maclay, Geraldo Docker, M.D. [facility] Pager [phone]  If 7PM-7AM, please contact night-coverage [email or web address] Password [email or web address] 09/20/2013, 2:50 PM   LOS: 2 days

## 2013-09-20 NOTE — Evaluation (Signed)
Physical Therapy Evaluation [name] MRN: [id number] DOB: 28-Nov-1931 Today's Date: 09/20/2013   History of Present Illness     Clinical Impression  Pt s/p L hip fx with IM hip screw repair presents with decreased L LE strength/ROM and post op pain limiting functional mobility.  Pt will benefit from follow up rehab at SNF level to maximize safety and IND.    Follow Up Recommendations SNF    Equipment Recommendations  None recommended by PT    Recommendations for Other Services       Precautions / Restrictions Precautions Precautions: Fall Precaution Comments: Pt very impulsive and attempting to sit without warning Restrictions Weight Bearing Restrictions: No LLE Weight Bearing: Weight bearing as tolerated Other Position/Activity Restrictions: WBAT per Dr Forbes Cellar AM note and in 2nd PT order      Mobility  Bed Mobility Overal bed mobility: Needs Assistance Bed Mobility: Supine to Sit;Sit to Supine     Supine to sit: Mod assist Sit to supine: Mod assist   General bed mobility comments: cues for sequence and use of R LE to self assist  Transfers Overall transfer level: Needs assistance Equipment used: Rolling walker (2 wheeled) Transfers: Sit to/from Stand Sit to Stand: +2 physical assistance;Mod assist         General transfer comment: Cues for safe transition position and for use of UEs to self assist  Ambulation/Gait Ambulation/Gait assistance: +2 physical assistance;Mod assist Ambulation Distance (Feet): 14 Feet Assistive device: Rolling walker (2 wheeled) Gait Pattern/deviations: Step-to pattern;Decreased step length - right;Decreased step length - left;Shuffle;Antalgic;Trunk flexed     General Gait Details: Pt requiring cues for posture, sequence, position from RW, stride length and increased UE WB.  Pt is impulsive and with increased pain not following cues and attempting to sit unsafely  Stairs            Wheelchair  Mobility    Modified Rankin (Stroke Patients Only)       Balance                                     Pertinent Vitals/Pain 7/10; premed, RN aware    Home Living Family/patient expects to be discharged to:: Skilled nursing facility                      Prior Function Level of Independence: Needs assistance         Comments: Pt was SNF resident prior to admit     Hand Dominance        Extremity/Trunk Assessment   Upper Extremity Assessment: Overall WFL for tasks assessed           Lower Extremity Assessment: LLE deficits/detail   LLE Deficits / Details: L LE strength 3-/5 with AAROM at hip to 90 flex and 20 abd     Communication   Communication: No difficulties  Cognition Arousal/Alertness: Awake/alert Behavior During Therapy: WFL for tasks assessed/performed Overall Cognitive Status: History of cognitive impairments - at baseline                      General Comments      Exercises General Exercises - Lower Extremity Ankle Circles/Pumps: AROM;Both;15 reps;Supine Quad Sets: AROM;Both;10 reps;Supine Heel Slides: AAROM;15 reps;Left;Supine Hip ABduction/ADduction: AAROM;Left;10 reps;Supine      Assessment/Plan    PT Assessment Patient needs continued PT services  PT Diagnosis Difficulty walking   PT Problem List Decreased strength;Decreased range of motion;Decreased activity tolerance;Decreased mobility;Decreased knowledge of use of DME;Decreased cognition;Decreased safety awareness;Decreased knowledge of precautions;Pain  PT Treatment Interventions DME instruction;Gait training;Functional mobility training;Therapeutic activities;Therapeutic exercise;Patient/family education   PT Goals (Current goals can be found in the Care Plan section) Acute Rehab PT Goals Patient Stated Goal: Less pain with movement PT Goal Formulation: With patient Time For Goal Achievement: 09/27/13 Potential to Achieve Goals: Good     Frequency Min 3X/week   Barriers to discharge        End of Session Equipment Utilized During Treatment: Gait belt Activity Tolerance: Patient limited by pain Patient left: in bed;with call bell/phone within reach;with bed alarm set         Time: 1100-1132 PT Time Calculation (min): 32 min   Charges:   PT Evaluation $Initial PT Evaluation Tier I: 1 Procedure PT Treatments $Gait Training: 8-22 mins $Therapeutic Exercise: 8-22 mins   PT G Codes:          Charne Mcbrien 09/20/2013, 1:03 PM

## 2013-09-20 NOTE — Progress Notes (Signed)
Subjective: Pt stable pain ok   Objective: Vital signs in last 24 hours: Temp:  [97.5 F (36.4 C)-98.6 F (37 C)] 97.6 F (36.4 C) (03/25 0555) Pulse Rate:  [61-93] 61 (03/25 0555) Resp:  [15-22] 16 (03/25 0555) BP: (139-166)/(64-89) 162/89 mmHg (03/25 0555) SpO2:  [98 %-100 %] 99 % (03/25 0555)  Intake/Output from previous day: 03/24 0701 - 03/25 0700 In: 1703.8 [I.V.:1043.8; Blood:660] Out: 2300 [Urine:2250; Blood:50] Intake/Output this shift:    Exam:  Neurovascular intact Sensation intact distally  Labs:  Recent Labs  09/18/13 1630 09/19/13 0440 09/20/13 0522  HGB 8.7* 8.0* 10.9*    Recent Labs  09/19/13 0440 09/20/13 0522  WBC 4.0 5.8  RBC 2.69* 3.97  3.97  HCT 24.0* 33.3*  PLT 144* 157    Recent Labs  09/19/13 0440 09/20/13 0522  NA 141 129*  K 3.4* 4.2  CL 105 91*  CO2 26 25  BUN 10 4*  CREATININE 0.48* 0.40*  GLUCOSE 91 104*  CALCIUM 7.2* 7.7*    Recent Labs  09/20/13 0522  INR 1.05    Assessment/Plan: Ok to wbat - could leave am if medically ok   Media Pizzini SCOTT 09/20/2013, 8:23 AM

## 2013-09-20 NOTE — Clinical Social Work Note (Signed)
CSW attempted to speak with pt's daughter, Estill Batten, regarding pt's discharge disposition. CSW to continue to contact pt's family. Full assessment to follow as soon as CSW speaks to family.  Pati Gallo, [place] Social Worker [phone]

## 2013-09-20 NOTE — Progress Notes (Signed)
OT Cancellation Note  Patient Details Name: ADRIELLA ESSEX MRN: [id number] DOB: 1931/11/06   Cancelled Treatment:    Reason Eval/Treat Not Completed: Other (comment) Pt to D/C to SNF. Will defer OT to SNF. If OT eval needed for admission, please text page OT @ [phone] or call [phone]. Thank you. [place], OTR/L  [phone] 09/20/2013 09/20/2013, 1:50 PM

## 2013-09-20 NOTE — Op Note (Signed)
NAMEROSEANNA, KOPLIN NO.:  [id number]  MEDICAL RECORD NO.:  [id number]  LOCATION:  [id number]                        FACILITY:  [facility]  PHYSICIAN:  Anderson Malta, M.D.    DATE OF BIRTH:  10-Nov-1931  DATE OF PROCEDURE: DATE OF DISCHARGE:                              OPERATIVE REPORT   PREOPERATIVE DIAGNOSIS:  Left hip fracture.  POSTOPERATIVE DIAGNOSIS:  Left hip fracture.  PROCEDURE:  Left hip intramedullary hip screw, Smith and Nephew 10 x 38 mm with a 95 mm compression screw and lag screw.  SURGEON:  Anderson Malta, M.D.  ASSISTANT:  None.  ANESTHESIA:  General.  INDICATIONS:  Carneshia is a patient with left hip fracture.  She is ambulatory, presents for operative management after explanation of risks and benefits.  PROCEDURE IN DETAIL:  The patient was brought to operating room where general endotracheal anesthesia was administered.  Perioperative antibiotics were administered.  Time-out was called.  Left hip was prescrubbed with alcohol, Betadine, which allowed to air dry.  Prepped for prep solution and draped in sterile manner.  Wall drape was utilized.  Under fluoroscopic guidance, a guide pin was placed in the tip of the trochanter through a small incision.  The guide pin was advanced under fluoroscopy.  Proximal reaming performed in the course of preoperative templating 10 x 38 nail was then placed through the proximal reamed hole.  Compression screw had been placed through the fracture and placed across the fracture through a separate incision. Both incisions were then irrigated.  Good reduction was achieved.  A compression screw was then placed into the lag screw.  The incisions were then both irrigated and closed using 2-0 Vicryl and staples.  The patient tolerated the procedure well without immediate complications and transferred to recovery room in stable condition.     Anderson Malta, M.D.     GSD/MEDQ  D:  09/19/2013  T:  09/20/2013  Job:   [id number]

## 2013-09-21 DIAGNOSIS — S72143A Displaced intertrochanteric fracture of unspecified femur, initial encounter for closed fracture: Principal | ICD-10-CM

## 2013-09-21 DIAGNOSIS — E785 Hyperlipidemia, unspecified: Secondary | ICD-10-CM

## 2013-09-21 DIAGNOSIS — IMO0002 Reserved for concepts with insufficient information to code with codable children: Secondary | ICD-10-CM

## 2013-09-21 DIAGNOSIS — E1165 Type 2 diabetes mellitus with hyperglycemia: Secondary | ICD-10-CM

## 2013-09-21 DIAGNOSIS — G8929 Other chronic pain: Secondary | ICD-10-CM

## 2013-09-21 DIAGNOSIS — R51 Headache: Secondary | ICD-10-CM

## 2013-09-21 DIAGNOSIS — E118 Type 2 diabetes mellitus with unspecified complications: Secondary | ICD-10-CM

## 2013-09-21 LAB — COMPREHENSIVE METABOLIC PANEL
ALT: 14 U/L (ref 0–35)
AST: 13 U/L (ref 0–37)
Albumin: 2.3 g/dL — ABNORMAL LOW (ref 3.5–5.2)
Alkaline Phosphatase: 198 U/L — ABNORMAL HIGH (ref 39–117)
BILIRUBIN TOTAL: 0.6 mg/dL (ref 0.3–1.2)
BUN: 6 mg/dL (ref 6–23)
CALCIUM: 7.6 mg/dL — AB (ref 8.4–10.5)
CO2: 25 meq/L (ref 19–32)
CREATININE: 0.49 mg/dL — AB (ref 0.50–1.10)
Chloride: 98 mEq/L (ref 96–112)
GFR calc Af Amer: >90 mL/min (ref >90)
GFR, EST NON AFRICAN AMERICAN: 90 mL/min — AB (ref >90)
GLUCOSE: 99 mg/dL (ref 70–99)
Potassium: 4.1 mEq/L (ref 3.7–5.3)
Sodium: 134 mEq/L — ABNORMAL LOW (ref 137–147)
Total Protein: 5.7 g/dL — ABNORMAL LOW (ref 6.0–8.3)

## 2013-09-21 LAB — TYPE AND SCREEN
ABO/RH(D): A POS
Antibody Screen: NEGATIVE
UNIT DIVISION: 0
UNIT DIVISION: 0
Unit division: 0

## 2013-09-21 LAB — CBC WITH DIFFERENTIAL/PLATELET
Basophils Absolute: 0 10*3/uL (ref 0.0–0.1)
Basophils Relative: 0 % (ref 0–1)
EOS PCT: 0 % (ref 0–5)
Eosinophils Absolute: 0 10*3/uL (ref 0.0–0.7)
HEMATOCRIT: 31.3 % — AB (ref 36.0–46.0)
Hemoglobin: 10.6 g/dL — ABNORMAL LOW (ref 12.0–15.0)
LYMPHS ABS: 0.6 10*3/uL — AB (ref 0.7–4.0)
LYMPHS PCT: 14 % (ref 12–46)
MCH: 28.3 pg (ref 26.0–34.0)
MCHC: 33.9 g/dL (ref 30.0–36.0)
MCV: 83.7 fL (ref 78.0–100.0)
MONO ABS: 0.4 10*3/uL (ref 0.1–1.0)
Monocytes Relative: 9 % (ref 3–12)
Neutro Abs: 3.4 10*3/uL (ref 1.7–7.7)
Neutrophils Relative %: 77 % (ref 43–77)
Platelets: 154 10*3/uL (ref 150–400)
RBC: 3.74 MIL/uL — AB (ref 3.87–5.11)
RDW: 18.6 % — ABNORMAL HIGH (ref 11.5–15.5)
WBC: 4.4 10*3/uL (ref 4.0–10.5)

## 2013-09-21 LAB — PROTIME-INR
INR: 1.26 (ref 0.00–1.49)
Prothrombin Time: 15.5 seconds — ABNORMAL HIGH (ref 11.6–15.2)

## 2013-09-21 LAB — GLUCOSE, CAPILLARY
GLUCOSE-CAPILLARY: 108 mg/dL — AB (ref 70–99)
GLUCOSE-CAPILLARY: 99 mg/dL (ref 70–99)
Glucose-Capillary: 102 mg/dL — ABNORMAL HIGH (ref 70–99)
Glucose-Capillary: 115 mg/dL — ABNORMAL HIGH (ref 70–99)
Glucose-Capillary: 89 mg/dL (ref 70–99)
Glucose-Capillary: 94 mg/dL (ref 70–99)

## 2013-09-21 LAB — MAGNESIUM: MAGNESIUM: 1.4 mg/dL — AB (ref 1.5–2.5)

## 2013-09-21 LAB — FOLATE RBC: RBC FOLATE: 809 ng/mL — AB (ref >280)

## 2013-09-21 LAB — TROPONIN I: Troponin I: <0.3 ng/mL (ref <0.30)

## 2013-09-21 MED ORDER — DEXTROSE 5 % IV SOLN
3.0000 g | Freq: Once | INTRAVENOUS | Status: AC
  Administered 2013-09-21: 3 g via INTRAVENOUS
  Filled 2013-09-21: qty 6

## 2013-09-21 MED ORDER — HYDROCODONE-ACETAMINOPHEN 5-325 MG PO TABS: 1.0000 | ORAL_TABLET | Freq: Four times a day (QID) | ORAL | Status: DC | PRN

## 2013-09-21 MED ORDER — SERTRALINE HCL 50 MG PO TABS: 150.0000 mg | ORAL_TABLET | Freq: Every day | ORAL | Status: AC

## 2013-09-21 MED ORDER — ASPIRIN EC 325 MG PO TBEC: 325.0000 mg | DELAYED_RELEASE_TABLET | Freq: Every day | ORAL | Status: AC

## 2013-09-21 NOTE — Clinical Social Work Psychosocial (Signed)
Clinical Social Work Department BRIEF PSYCHOSOCIAL ASSESSMENT 09/21/2013  Patient:  [name], [name]     Account Number:  [id number]     Admit date:  09/18/2013  Clinical Social Worker:  Donna Christen  Date/Time:  09/21/2013 03:19 PM  Referred by:  Physician  Date Referred:  09/21/2013 Referred for  SNF Placement   Other Referral:   none.   Interview type:  Family Other interview type:   CSW spoke with pt's granddaughter, Caryl Pina.    PSYCHOSOCIAL DATA Living Status:  FACILITY Admitted from facility:  [place], [place] Level of care:  [place] Primary support name:  Clara Hairston /  Silvana Newness Primary support relationship to patient:  CHILD, ADULT Degree of support available:   Per Caryl Pina, both pt's daughter Estill Batten) and granddaughter Caryl Pina) care for pt.    CURRENT CONCERNS Current Concerns  Post-Acute Placement   Other Concerns:   none.    SOCIAL WORK ASSESSMENT / PLAN CSW received call from [facility] admissions liaison stating that pt is a resident at their facility. CSW attempted to speak with family on 09/20/2013, but was unable to contact any family members.    CSW attempted to contact pt's daughter, Estill Batten, on 09/21/2013 but was unable to reach her. CSW spoke with pt's granddaughter, Caryl Pina, who confirmed that the pt has been living at [facility] [place] for the past two years. Per Caryl Pina, family would like for pt to return to [facility] [place] upon discharge from [facility].    CSW to assist with discharge planning needs for pt to return to SNF.   Assessment/plan status:  Psychosocial Support/Ongoing Assessment of Needs Other assessment/ plan:   none.   Information/referral to community resources:   none, patient returning to SNF.    PATIENTS/FAMILYS RESPONSE TO PLAN OF CARE: Pt's family was understanding and agreeable to CSW plan of care. Caryl Pina expressed concern and  questions regarding pt's medical stability. CSW referred Caryl Pina to speak with pt's RN. Caryl Pina appreciative of CSW call and information for speaking with pt's RN.       Pati Gallo, [place] Social Worker [phone]

## 2013-09-21 NOTE — Discharge Summary (Signed)
Physician Discharge Summary  [name] [id number] DOB: 1931/08/11 DOA: 09/18/2013  PCP: Hollace Kinnier, DO  Admit date: 09/18/2013 Discharge date: 09/21/2013  Time spent: 40 minutes  Recommendations for Outpatient Follow-up:   Left intertrochanteric femur fracture  -S/P ORIF 09/20/2013 Left hip  -discharge toSNF  anemia chronic disease  -Baseline hemoglobin around 10  -Presenting hemoglobin 8.7  -Likely anemia of chronic disease  -May also have had some blood loss due to femur fracture  -Iron, TIBC, ferritin, serum B12, folate consistent with anemia of chronic disease  -stable for discharge  Diabetes mellitus type 2  -Hemoglobin A1c 5.5  -Continue sensitive SSI   Hypertension  -Elevated blood pressure partly due to pain  -Discontinue Amlodipine 5 mg daily  -Continue clonidine 0.1 mg daily  -Continue metoprolol tartrate 50 mg BID  -Will allow patient's BP to run permissively high secondary to her age and increased risk of fall with hypotension   HLD  -within ADA guideline   Depression  -Continue sertraline   Hypokalemia  -resolved  Hypomagnesemia -Replete 3 g magnesium IV recheck in the a.m.   Hyponatremia  -May be secondary to dehydration from surgery will gently hydrate vs SIADH secondary to acute illness -resolving stable for discharge   Chest pain  -3/25 stat EKG; NSR  -3/25 troponin x3 .: Negative -3/25 proBNP; elevated 1442     Discharge Diagnoses:  Active Problems:   HYPERLIPIDEMIA   DEPRESSION   HYPERTENSION   Perforated prepyloric gastric ulcer s/p antrectomy with Billroth II reconstruction   DVT (deep venous thrombosis)   Dementia   Chronic pain   Insomnia   Type II or unspecified type diabetes mellitus with unspecified complication, uncontrolled   Femur fracture   Hip fracture   Discharge Condition: Stable  Diet recommendation: Heart healthy/carb modified  Filed Weights   09/18/13 1605 09/18/13 2138  Weight: 51.71 kg (114 lb)  51.8 kg (114 lb 3.2 oz)    History of present illness:  80yo BF PMHx diabetes mellitus type 2, urinary incontinence, malnutrition, dementia,gastroileal fistula with short gut syndrome presenting with fall and L femur fracture. Per report, pt fell about 6 days prior to admission at nursing home. Pt states that she landed on her L hip. States that she has had L hip pain since this point. No bowel or bladder anesthesia. No distal numbness.  X-rays in the ED showed an intertrochanteric left femur fracture. Orthopedics, Dr. Marlou Sa was consulted. The patient was noted to have a hemoglobin of 8.7. There was no signs of active bleeding. 3/25 states starting yesterday positive sharp substernal chest pain, positive SOB, negative radiation, last approximately 15 minutes then spontaneously resolves.3/26 no complaints     Consult  Dr. Marcene Duos (orthopedic surgery)    Procedure  09/20/2013 Left hip intramedullary hip screw, Smith and [employer]hew 10 x 38 mm with a 95 mm compression screw and lag screw.    Antibiotic    Discharge Exam: Filed Vitals:   09/21/13 0400 09/21/13 0600 09/21/13 0930 09/21/13 1337  BP:  133/74 122/58 121/71  Pulse:  84 74 64  Temp:  99 F (37.2 C) 99 F (37.2 C) 98.4 F (36.9 C)  TempSrc:  Oral Oral Oral  Resp: 18 18 18 16   Height:      Weight:      SpO2: 98% 98% 100% 98%   General: Pt is alert, follows commands appropriately, not in acute distress  Cardiovascular: RRR, S1/S2, no rubs, no gallops  Respiratory: CTA bilaterally,  no wheezing, no crackles, no rhonchi  Abdomen: Soft/+BS, non tender, non distended, no guarding  Extremities: trace LE edema, No lymphangitis, No petechiae, No rashes, no synovitis  Discharge Instructions     Medication List    ASK your doctor about these medications       acetaminophen 500 MG tablet  Commonly known as:  TYLENOL  Take 500 mg by mouth every 6 (six) hours as needed for pain or fever.     amLODipine 10 MG tablet   Commonly known as:  NORVASC  Take 5 mg by mouth daily.     butalbital-acetaminophen-caffeine 50-325-40 MG per tablet  Commonly known as:  FIORICET, ESGIC  Take 2 tablets by mouth every 8 (eight) hours as needed. For headache     cholecalciferol 1000 UNITS tablet  Commonly known as:  VITAMIN D  Take 2,000 Units by mouth daily.     cloNIDine 0.1 MG tablet  Commonly known as:  CATAPRES  Take 0.1 mg by mouth daily.     darifenacin 15 MG 24 hr tablet  Commonly known as:  ENABLEX  Take 15 mg by mouth daily.     gabapentin 100 MG capsule  Commonly known as:  NEURONTIN  Take 100 mg by mouth daily.     insulin aspart 100 UNIT/ML injection  Commonly known as:  novoLOG  Inject 5 Units into the skin 3 (three) times daily as needed. For cbg >150 take 5 units 15 minutes before or 30 minutes after meal     LORazepam 0.5 MG tablet  Commonly known as:  ATIVAN  Take 0.5 mg by mouth 4 (four) times daily.     metoprolol 50 MG tablet  Commonly known as:  LOPRESSOR  Take 50 mg by mouth 2 (two) times daily.     morphine 20 MG/ML concentrated solution  Commonly known as:  ROXANOL  Give 10mg  (0.1ml) by mouth every 6 hours for pain     omeprazole 20 MG capsule  Commonly known as:  PRILOSEC  Take 20 mg by mouth 2 (two) times daily.     PATADAY 0.2 % Soln  Generic drug:  Olopatadine HCl  Apply 1 drop to eye daily. Both eyes     ranitidine 150 MG tablet  Commonly known as:  ZANTAC  Take 150 mg by mouth daily.     sertraline 100 MG tablet  Commonly known as:  ZOLOFT  Take 1.5 tablets (150 mg total) by mouth daily.       Allergies  Allergen Reactions  . Other Swelling    Pinto beans cause swelling and hives  . Penicillins Swelling      The results of significant diagnostics from this hospitalization (including imaging, microbiology, ancillary and laboratory) are listed below for reference.    Significant Diagnostic Studies: Dg Chest 2 View  09/18/2013   CLINICAL DATA:   Esophagitis in hiatal hernia  EXAM: CHEST  2 VIEW  COMPARISON:  DG CHEST 1V PORT dated 01/10/2012  FINDINGS: Normal cardiac silhouette with ectatic aorta. No effusion, infiltrate, pneumothorax. Chronic elevation of the left hemidiaphragm.  IMPRESSION: No acute cardiopulmonary process.   Electronically Signed   By: Suzy Bouchard M.D.   On: 09/18/2013 17:56   Dg Hip Complete Left  09/18/2013   CLINICAL DATA:  Left hip pain, fall 6 days ago  EXAM: LEFT HIP - COMPLETE 2+ VIEW  COMPARISON:  None.  FINDINGS: Five views of left hip submitted. There is diffuse osteopenia. There is right  hip prosthesis in anatomic alignment. There is poorly visualized left femur fracture in intertrochanteric region. On the frontal view there is no definite cortical break through medially. However on the frog view there is a fracture line with cortical step-off medially. There is displacement of greater femoral trochanter.  IMPRESSION: Fracture of proximal left femur as described above. Limited study by diffuse osteopenia. Right hip prosthesis in anatomic alignment   Electronically Signed   By: Lahoma Crocker M.D.   On: 09/18/2013 18:06   Dg Femur Left  09/20/2013   CLINICAL DATA:  Post ORIF of the left femur  EXAM: LEFT FEMUR - 2 VIEW; DG C-ARM 1-60 MIN  COMPARISON:  DG HIP COMPLETE*L* dated 09/18/2013  FINDINGS: Two spot intraoperative fluoroscopic images of the right hip are provided for review.  Images demonstrate the sequela of intra medullary rod fixation of the proximal aspect of the left femur and dynamic screw fixation of the left femoral neck. Alignment appears improved though there is persistent displacement of the posterior aspect of the greater trochanter. There is a minimal amount of subcutaneous emphysema about the operative site. No radiopaque foreign body.  IMPRESSION: Post ORIF of the left femur and femoral neck without evidence of complication.   Electronically Signed   By: Sandi Mariscal M.D.   On: 09/20/2013 08:58   Dg  Pelvis Portable  09/20/2013   CLINICAL DATA:  Postoperative left femur  EXAM: PORTABLE PELVIS 1-2 VIEWS  COMPARISON:  No preoperative films are available.  FINDINGS: The patient has undergone placement of an intra medullary rod and telescoping screw within the left femur presumably for an intertrochanteric fracture. Positioning of the appliance is radiographically acceptable.  IMPRESSION: There is no evidence of an immediate postprocedure complication following telescoping screw and intra medullary rod placement in the left femur.   Electronically Signed   By: David  Martinique   On: 09/20/2013 08:28   Dg C-arm 1-60 Min  09/20/2013   CLINICAL DATA:  Post ORIF of the left femur  EXAM: LEFT FEMUR - 2 VIEW; DG C-ARM 1-60 MIN  COMPARISON:  DG HIP COMPLETE*L* dated 09/18/2013  FINDINGS: Two spot intraoperative fluoroscopic images of the right hip are provided for review.  Images demonstrate the sequela of intra medullary rod fixation of the proximal aspect of the left femur and dynamic screw fixation of the left femoral neck. Alignment appears improved though there is persistent displacement of the posterior aspect of the greater trochanter. There is a minimal amount of subcutaneous emphysema about the operative site. No radiopaque foreign body.  IMPRESSION: Post ORIF of the left femur and femoral neck without evidence of complication.   Electronically Signed   By: Sandi Mariscal M.D.   On: 09/20/2013 08:58    Microbiology: Recent Results (from the past 240 hour(s))  SURGICAL PCR SCREEN     Status: None   Collection Time    09/18/13  9:02 PM      Result Value Ref Range Status   MRSA, PCR NEGATIVE  NEGATIVE Final   Staphylococcus aureus NEGATIVE  NEGATIVE Final   Comment:            The Xpert SA Assay (FDA     approved for NASAL specimens     in patients over 44 years of age),     is one component of     a comprehensive surveillance     program.  Test performance has     been validated by [facility]  for patients greater     than or equal to 67 year old.     It is not intended     to diagnose infection nor to     guide or monitor treatment.     Labs: Basic Metabolic Panel:  Recent Labs Lab 09/18/13 1630 09/18/13 2002 09/19/13 0440 09/20/13 0522 09/21/13 0535  NA 140  --  141 129* 134*  K 3.3*  --  3.4* 4.2 4.1  CL 101  --  105 91* 98  CO2 27  --  26 25 25   GLUCOSE 102*  --  91 104* 99  BUN 14  --  10 4* 6  CREATININE 0.59  --  0.48* 0.40* 0.49*  CALCIUM 7.5* 7.1* 7.2* 7.7* 7.6*  MG  --   --   --  1.4* 1.4*   Liver Function Tests:  Recent Labs Lab 09/19/13 0440 09/20/13 0522 09/21/13 0535  AST 16 21 13   ALT 14 19 14   ALKPHOS 214* 223* 198*  BILITOT 0.3 1.1 0.6  PROT 5.3* 6.0 5.7*  ALBUMIN 2.2* 2.5* 2.3*   No results found for this basename: LIPASE, AMYLASE,  in the last 168 hours No results found for this basename: AMMONIA,  in the last 168 hours CBC:  Recent Labs Lab 09/18/13 1630 09/19/13 0440 09/20/13 0522 09/21/13 0535  WBC 5.4 4.0 5.8 4.4  NEUTROABS 3.8 2.4 4.6 3.4  HGB 8.7* 8.0* 10.9* 10.6*  HCT 26.8* 24.0* 33.3* 31.3*  MCV 89.6 89.2 83.9 83.7  PLT 163 144* 157 154   Cardiac Enzymes:  Recent Labs Lab 09/20/13 1635 09/20/13 2245 09/21/13 0535  TROPONINI <0.30 <0.30 <0.30   BNP: BNP (last 3 results)  Recent Labs  09/20/13 1635  PROBNP 1442.0*   CBG:  Recent Labs Lab 09/20/13 2101 09/21/13 0008 09/21/13 0417 09/21/13 0748 09/21/13 1142  GLUCAP 135* 89 94 102* 108*       Signed:  Dia Crawford, MD [facility] [phone] pager

## 2013-09-21 NOTE — Progress Notes (Signed)
Spoke with SW at this time about new DC order. SW will work on placement in a.m.Marland Kitchen

## 2013-09-21 NOTE — Progress Notes (Signed)
Pt stable Resting Pain ok Ready for dc from ortho perspective [facility] for dvt prophylaxis F/u 2 weeks

## 2013-09-22 DIAGNOSIS — F411 Generalized anxiety disorder: Secondary | ICD-10-CM

## 2013-09-22 LAB — COMPREHENSIVE METABOLIC PANEL
ALT: 11 U/L (ref 0–35)
AST: 13 U/L (ref 0–37)
Albumin: 2 g/dL — ABNORMAL LOW (ref 3.5–5.2)
Alkaline Phosphatase: 175 U/L — ABNORMAL HIGH (ref 39–117)
BILIRUBIN TOTAL: 0.4 mg/dL (ref 0.3–1.2)
BUN: 9 mg/dL (ref 6–23)
CHLORIDE: 102 meq/L (ref 96–112)
CO2: 25 meq/L (ref 19–32)
Calcium: 7.4 mg/dL — ABNORMAL LOW (ref 8.4–10.5)
Creatinine, Ser: 0.51 mg/dL (ref 0.50–1.10)
GFR calc Af Amer: >90 mL/min (ref >90)
GFR, EST NON AFRICAN AMERICAN: 88 mL/min — AB (ref >90)
Glucose, Bld: 99 mg/dL (ref 70–99)
POTASSIUM: 4.4 meq/L (ref 3.7–5.3)
SODIUM: 135 meq/L — AB (ref 137–147)
Total Protein: 5.1 g/dL — ABNORMAL LOW (ref 6.0–8.3)

## 2013-09-22 LAB — CBC WITH DIFFERENTIAL/PLATELET
Basophils Absolute: 0 10*3/uL (ref 0.0–0.1)
Basophils Relative: 0 % (ref 0–1)
EOS ABS: 0 10*3/uL (ref 0.0–0.7)
Eosinophils Relative: 1 % (ref 0–5)
HCT: 28.2 % — ABNORMAL LOW (ref 36.0–46.0)
Hemoglobin: 9.3 g/dL — ABNORMAL LOW (ref 12.0–15.0)
LYMPHS ABS: 0.8 10*3/uL (ref 0.7–4.0)
LYMPHS PCT: 23 % (ref 12–46)
MCH: 28.1 pg (ref 26.0–34.0)
MCHC: 33 g/dL (ref 30.0–36.0)
MCV: 85.2 fL (ref 78.0–100.0)
Monocytes Absolute: 0.3 10*3/uL (ref 0.1–1.0)
Monocytes Relative: 9 % (ref 3–12)
NEUTROS PCT: 67 % (ref 43–77)
Neutro Abs: 2.4 10*3/uL (ref 1.7–7.7)
PLATELETS: 145 10*3/uL — AB (ref 150–400)
RBC: 3.31 MIL/uL — AB (ref 3.87–5.11)
RDW: 18.6 % — ABNORMAL HIGH (ref 11.5–15.5)
WBC: 3.6 10*3/uL — AB (ref 4.0–10.5)

## 2013-09-22 LAB — MAGNESIUM: Magnesium: 2.1 mg/dL (ref 1.5–2.5)

## 2013-09-22 LAB — GLUCOSE, CAPILLARY
GLUCOSE-CAPILLARY: 112 mg/dL — AB (ref 70–99)
GLUCOSE-CAPILLARY: 91 mg/dL (ref 70–99)
GLUCOSE-CAPILLARY: 95 mg/dL (ref 70–99)
GLUCOSE-CAPILLARY: 97 mg/dL (ref 70–99)
Glucose-Capillary: 112 mg/dL — ABNORMAL HIGH (ref 70–99)

## 2013-09-22 LAB — PROTIME-INR
INR: 1.15 (ref 0.00–1.49)
PROTHROMBIN TIME: 14.5 s (ref 11.6–15.2)

## 2013-09-22 MED ORDER — LORAZEPAM 0.5 MG PO TABS: 0.5000 mg | ORAL_TABLET | Freq: Four times a day (QID) | ORAL | Status: DC

## 2013-09-22 NOTE — Clinical Social Work Note (Signed)
Pt has been authorized to return to [facility] [place] on 09/22/2013. CSW has faxed discharge summary to [facility]. CSW has completed discharge packet and placed on pt's shadow chart. CSW to arrange for transportation via ambulance.   RN to please call report to [facility] [place] [facility] [place], [place] Worker [phone]

## 2013-09-22 NOTE — Progress Notes (Signed)
Physical Therapy Treatment [name] MRN: [id number] DOB: 16-Aug-1931 Today's Date: 09/22/2013    History of Present Illness L hip fx, s/p IM nail    PT Comments    **Pt c/o 8/10 headache pain, RN notified. Assisted pt from bed to recliner and performed LLE exercises. Ready to DC to SNF from PT standpoint. *  Follow Up Recommendations  SNF     Equipment Recommendations  None recommended by PT    Recommendations for Other Services       Precautions / Restrictions Precautions Precautions: Fall Restrictions Weight Bearing Restrictions: No LLE Weight Bearing: Weight bearing as tolerated Other Position/Activity Restrictions: WBAT per Dr Forbes Cellar AM note and in 2nd PT order    Mobility  Bed Mobility Overal bed mobility: Needs Assistance Bed Mobility: Supine to Sit     Supine to sit: Mod assist     General bed mobility comments: cues for sequence and use of R LE to self assist  Transfers Overall transfer level: Needs assistance Equipment used: Rolling walker (2 wheeled) Transfers: Sit to/from Stand Sit to Stand: +2 physical assistance;Mod assist         General transfer comment: Cues for safe transition position and for use of UEs to self assist  Ambulation/Gait Ambulation/Gait assistance: +2 safety/equipment;Min assist Ambulation Distance (Feet): 4 Feet Assistive device: Rolling walker (2 wheeled) Gait Pattern/deviations: Step-to pattern;Decreased step length - right;Decreased step length - left;Antalgic     General Gait Details: Pt requiring cues for posture, sequence, position from RW, stride length and increased UE WB.  Distance limited by 8/10 headache pain.   Stairs            Wheelchair Mobility    Modified Rankin (Stroke Patients Only)       Balance                                    Cognition Arousal/Alertness: Awake/alert Behavior During Therapy: WFL for tasks assessed/performed Overall Cognitive  Status: History of cognitive impairments - at baseline                      Exercises General Exercises - Lower Extremity Ankle Circles/Pumps: AROM;Both;Supine;20 reps Quad Sets:  (attempted quad sets, but pt unable to perform them correctly) Heel Slides: AAROM;15 reps;Left;Supine Hip ABduction/ADduction: AAROM;Left;Supine;15 reps    General Comments        Pertinent Vitals/Pain *8/10 headache pain, RN notified Ice pack applied to L hip**    Home Living                      Prior Function            PT Goals (current goals can now be found in the care plan section) Acute Rehab PT Goals Patient Stated Goal: Less pain with movement PT Goal Formulation: With patient Time For Goal Achievement: 09/27/13 Potential to Achieve Goals: Good Progress towards PT goals: Progressing toward goals    Frequency  Min 3X/week    PT Plan Current plan remains appropriate    End of Session Equipment Utilized During Treatment: Gait belt Activity Tolerance: Patient limited by pain Patient left: with call bell/phone within reach;in chair;with nursing/sitter in room     Time: 0929-0952 PT Time Calculation (min): 23 min  Charges:  $Therapeutic Exercise: 8-22 mins $Therapeutic Activity: 8-22 mins  G Codes:      Philomena Doheny 09/22/2013, 9:56 AM [phone]

## 2013-09-22 NOTE — Progress Notes (Addendum)
Patient was seen and examined at the bedside. Patient is stable for discharge today from medical standpoint. Please refer to discharge summary done 09/21/2013.  No new changes in medications since 09/21/2013.  Faye Ramsay, MD  [facility] Pager [phone]  If 7PM-7AM, please contact night-coverage [email or web address] Password [email or web address]

## 2013-09-23 LAB — VITAMIN D 1,25 DIHYDROXY
VITAMIN D3 1, 25 (OH): 20 pg/mL
Vitamin D 1, 25 (OH)2 Total: 20 pg/mL (ref 18–72)
Vitamin D2 1, 25 (OH)2: <8 pg/mL

## 2013-09-25 ENCOUNTER — Other Ambulatory Visit: Payer: Self-pay | Admitting: *Deleted

## 2013-09-25 MED ORDER — MORPHINE SULFATE (CONCENTRATE) 20 MG/ML PO SOLN: ORAL | Status: DC

## 2013-09-25 MED ORDER — LORAZEPAM 0.5 MG PO TABS: 0.5000 mg | ORAL_TABLET | Freq: Four times a day (QID) | ORAL | Status: DC

## 2013-09-25 NOTE — Telephone Encounter (Signed)
[facility] [place] 

## 2013-09-26 ENCOUNTER — Non-Acute Institutional Stay (SKILLED_NURSING_FACILITY): Payer: Medicare HMO | Admitting: Internal Medicine

## 2013-09-26 ENCOUNTER — Encounter: Payer: Self-pay | Admitting: Internal Medicine

## 2013-09-26 DIAGNOSIS — K316 Fistula of stomach and duodenum: Secondary | ICD-10-CM

## 2013-09-26 DIAGNOSIS — E440 Moderate protein-calorie malnutrition: Secondary | ICD-10-CM

## 2013-09-26 DIAGNOSIS — F411 Generalized anxiety disorder: Secondary | ICD-10-CM

## 2013-09-26 DIAGNOSIS — S72009A Fracture of unspecified part of neck of unspecified femur, initial encounter for closed fracture: Secondary | ICD-10-CM

## 2013-09-26 DIAGNOSIS — E118 Type 2 diabetes mellitus with unspecified complications: Secondary | ICD-10-CM

## 2013-09-26 DIAGNOSIS — G8929 Other chronic pain: Secondary | ICD-10-CM

## 2013-09-26 NOTE — Progress Notes (Signed)
Patient ID: [name], female   DOB: 1932-01-06, 80 y.o.   MRN: [id number]  Provider:  Rexene Edison. Mariea Clonts, D.O., C.M.D.  Location:    PCP: Hollace Kinnier, DO  Code Status: DNR   Allergies  Allergen Reactions  . Other Swelling    Pinto beans cause swelling and hives  . Penicillins Swelling    Chief Complaint  Patient presents with  . Hospitalization Follow-up    readmission s/p hospitalization with left hip fracture s/p intramedullary nail     HPI: 80 y.o. female with dementia, osteoarthritis, anxiety, htn, depression, chronic headaaches, short gut syndrome was seen for her readmission s/p hospitalization with left hip fx.  She underwent intramedullary nailing by Dr. Marlou Sa.  She c/o pain when seen.  Staff note that the hydrocodone does not seem to be helping her and she is more anxious with oral ativan also.    ROS: Review of Systems  Constitutional: Negative for fever, chills and malaise/fatigue.  Eyes: Negative for blurred vision.  Respiratory: Negative for shortness of breath.   Cardiovascular: Negative for chest pain.  Gastrointestinal: Negative for abdominal pain, constipation, blood in stool and melena.  Genitourinary: Negative for dysuria.  Musculoskeletal: Positive for joint pain. Negative for falls and myalgias.       Left hip hurts right now (has not had pain med for am at 930)  Neurological: Positive for headaches. Negative for dizziness and loss of consciousness.  Endo/Heme/Allergies: Bruises/bleeds easily.  Psychiatric/Behavioral: Positive for depression and memory loss. The patient is nervous/anxious.      Past Medical History  Diagnosis Date  . Hypertension   . Reflux   . Right elbow pain     OTIF  . Dementia   . Depression   . Osteoarthritis   . Pancreatitis 11/2007    HOP  . Angiomyolipoma of kidney     right  . Ulcerative esophagitis 12/05/2007    hx elevated gastrin, severe on EGD by Dr Gala Romney , h pylori negative  . Hiatal hernia   . S/P colonoscopy  2009    pt reports normal by Dr Arnoldo Morale  . Diabetes mellitus   . Anemia   . Hyponatremia   . Cellulitis   . Esophagitis   . Peptic ulcer   . Insomnia   . Sepsis(995.91)   . Respiratory failure   . Sepsis(995.91)   . Clostridium difficile infection   . Esophagitis   . Depression   . Encephalitis   . Urinary tract infection   . Bacteremia   . 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 Perforated prepyloric gastric ulcer s/p antrectomy with Billroth II reconstruction 01/30/2011  . Fracture 08/2013    left hip from fall   Past Surgical History  Procedure Laterality Date  . Orif right hip  1999    [facility]  . Umbilical hernia repair  64 months old    [place]  . Esophagogastroduodenoscopy  01/28/2011    Procedure: ESOPHAGOGASTRODUODENOSCOPY (EGD);  Surgeon: Dorothyann Peng, MD;  [facility];  Service: Endoscopy;  Laterality: N/A;  . Colonoscopy  01/28/2011    Procedure: COLONOSCOPY;  Surgeon: Dorothyann Peng, MD;  [facility];  Service: Endoscopy;  Laterality: N/A;  . Laparotomy  02/04/2011    Procedure: EXPLORATORY LAPAROTOMY;  Surgeon: Donato Heinz;  Location: [facility];  Service: General;  Laterality: N/A;  . Laparotomy  05/07/2011    Procedure: EXPLORATORY LAPAROTOMY;  Surgeon: Adin Hector, MD;  Location: [facility];  Service: General;  Laterality: N/A;  lysis of adhesions  . Gastrostomy  05/07/2011    Procedure: GASTROSTOMY;  Surgeon: Adin Hector, MD;  Location: [place];  Service: General;  Laterality: N/A;  g-tube / j -tube placement  . Gastrectomy  05/07/2011    Procedure: GASTRECTOMY;  Surgeon: Adin Hector, MD;  Location: [place];  Service: General;  Laterality: N/A;  Partial gastrectomy  . Cholecystectomy  05/07/2011    Procedure: CHOLECYSTECTOMY;  Surgeon: Adin Hector, MD;  Location: [place];  Service: General;  Laterality: N/A;  open  . Tee without cardioversion  10/21/2011    Procedure: TRANSESOPHAGEAL ECHOCARDIOGRAM (TEE);   Surgeon: Josue Hector, MD;  [facility];  Service: Cardiovascular;  Laterality: N/A;  . Femur im nail Left 09/19/2013    Procedure: INTRAMEDULLARY (IM) NAIL FEMORAL;  Surgeon: Meredith Pel, MD;  Location: [place];  Service: Orthopedics;  Laterality: Left;   Social History:   reports that she has never smoked. Her smokeless tobacco use includes Chew. She reports that she does not drink alcohol or use illicit drugs.  Family History  Problem Relation Age of Onset  . Cancer Mother     pelvic     Medications: Patient's Medications  New Prescriptions   No medications on file  Previous Medications   ACETAMINOPHEN (TYLENOL) 500 MG TABLET    Take 500 mg by mouth every 6 (six) hours as needed for pain or fever.   ASPIRIN EC 325 MG TABLET    Take 1 tablet (325 mg total) by mouth daily.   BUTALBITAL-ACETAMINOPHEN-CAFFEINE (FIORICET, ESGIC) 50-325-40 MG PER TABLET    Take 2 tablets by mouth every 8 (eight) hours as needed. For headache   CHOLECALCIFEROL (VITAMIN D) 1000 UNITS TABLET    Take 2,000 Units by mouth daily.   CLONIDINE (CATAPRES) 0.1 MG TABLET    Take 0.1 mg by mouth daily.   DARIFENACIN (ENABLEX) 15 MG 24 HR TABLET    Take 15 mg by mouth daily.    GABAPENTIN (NEURONTIN) 100 MG CAPSULE    Take 100 mg by mouth daily.   HYDROCODONE-ACETAMINOPHEN (NORCO) 5-325 MG PER TABLET    Take 1-2 tablets by mouth every 6 (six) hours as needed for moderate pain.   INSULIN ASPART (NOVOLOG) 100 UNIT/ML INJECTION    Inject 5 Units into the skin 3 (three) times daily as needed. For cbg >150 take 5 units 15 minutes before or 30 minutes after meal   LORAZEPAM (ATIVAN) 0.5 MG TABLET    Take 1 tablet (0.5 mg total) by mouth 4 (four) times daily.   METOPROLOL (LOPRESSOR) 50 MG TABLET    Take 50 mg by mouth 2 (two) times daily.    MORPHINE (ROXANOL) 20 MG/ML CONCENTRATED SOLUTION    Take 0.20ml by mouth every 6 hours as needed for pain   OLOPATADINE HCL (PATADAY) 0.2 % SOLN    Apply 1 drop to eye  daily. Both eyes   OMEPRAZOLE (PRILOSEC) 20 MG CAPSULE    Take 20 mg by mouth 2 (two) times daily.   RANITIDINE (ZANTAC) 150 MG TABLET    Take 150 mg by mouth daily.   SERTRALINE (ZOLOFT) 50 MG TABLET    Take 3 tablets (150 mg total) by mouth daily.  Modified Medications   No medications on file  Discontinued Medications   No medications on file     Physical Exam: Filed Vitals:   09/26/13 1154  BP:  120/67  Pulse: 74  Temp: 96.8 F (36 C)  Resp: 20  Height: 5\' 6"  (1.676 m)  Weight: 110 lb (49.896 kg)  SpO2: 97%  Physical Exam  Constitutional:  Thin black female, nad seen in her bed today  HENT:  Head: Normocephalic and atraumatic.  Right Ear: External ear normal.  Left Ear: External ear normal.  Nose: Nose normal.  Mouth/Throat: Oropharynx is clear and moist.  Eyes: Conjunctivae and EOM are normal. Pupils are equal, round, and reactive to light.  Neck: Normal range of motion. Neck supple. No JVD present.  Cardiovascular: Normal rate, regular rhythm, normal heart sounds and intact distal pulses.   PVCs  Pulmonary/Chest: Effort normal and breath sounds normal. No respiratory distress.  Abdominal: Soft. Bowel sounds are normal. She exhibits no distension and no mass. There is no tenderness.  Has scars on abdomen  Musculoskeletal: Normal range of motion.  Minimal erythema around her small left hip incision site which has staples in place  Neurological: She is alert.  Oriented to person and place only  Skin: Skin is warm and dry.  Psychiatric:  anxious    Labs reviewed: Basic Metabolic Panel:  Recent Labs  09/20/13 0522 09/21/13 0535 09/22/13 0505  NA 129* 134* 135*  K 4.2 4.1 4.4  CL 91* 98 102  CO2 25 25 25   GLUCOSE 104* 99 99  BUN 4* 6 9  CREATININE 0.40* 0.49* 0.51  CALCIUM 7.7* 7.6* 7.4*  MG 1.4* 1.4* 2.1   Liver Function Tests:  Recent Labs  09/20/13 0522 09/21/13 0535 09/22/13 0505  AST 21 13 13   ALT 19 14 11   ALKPHOS 223* 198* 175*  BILITOT  1.1 0.6 0.4  PROT 6.0 5.7* 5.1*  ALBUMIN 2.5* 2.3* 2.0*   CBC:  Recent Labs  09/20/13 0522 09/21/13 0535 09/22/13 0505  WBC 5.8 4.4 3.6*  NEUTROABS 4.6 3.4 2.4  HGB 10.9* 10.6* 9.3*  HCT 33.3* 31.3* 28.2*  MCV 83.9 83.7 85.2  PLT 157 154 145*   Cardiac Enzymes:  Recent Labs  09/20/13 1635 09/20/13 2245 09/21/13 0535  TROPONINI <0.30 <0.30 <0.30   BNP: No components found with this basename: POCBNP,  CBG:  Recent Labs  09/22/13 0748 09/22/13 1148 09/22/13 1559  GLUCAP 112* 91 112*    Imaging and Procedures: Dg Chest 2 View  09/18/2013 CLINICAL DATA: Esophagitis in hiatal hernia EXAM: CHEST 2 VIEW COMPARISON: DG CHEST 1V PORT dated 01/10/2012 FINDINGS: Normal cardiac silhouette with ectatic aorta. No effusion, infiltrate, pneumothorax. Chronic elevation of the left hemidiaphragm. IMPRESSION: No acute cardiopulmonary process. Electronically Signed By: Suzy Bouchard M.D. On: 09/18/2013 17:56  Dg Hip Complete Left  09/18/2013 CLINICAL DATA: Left hip pain, fall 6 days ago EXAM: LEFT HIP - COMPLETE 2+ VIEW COMPARISON: None. FINDINGS: Five views of left hip submitted. There is diffuse osteopenia. There is right hip prosthesis in anatomic alignment. There is poorly visualized left femur fracture in intertrochanteric region. On the frontal view there is no definite cortical break through medially. However on the frog view there is a fracture line with cortical step-off medially. There is displacement of greater femoral trochanter. IMPRESSION: Fracture of proximal left femur as described above. Limited study by diffuse osteopenia. Right hip prosthesis in anatomic alignment Electronically Signed By: Lahoma Crocker M.D. On: 09/18/2013 18:06  Dg Femur Left  09/20/2013 CLINICAL DATA: Post ORIF of the left femur EXAM: LEFT FEMUR - 2 VIEW; DG C-ARM 1-60 MIN COMPARISON: DG HIP COMPLETE*L* dated 09/18/2013 FINDINGS: Two spot  intraoperative fluoroscopic images of the right hip are provided for  review. Images demonstrate the sequela of intra medullary rod fixation of the proximal aspect of the left femur and dynamic screw fixation of the left femoral neck. Alignment appears improved though there is persistent displacement of the posterior aspect of the greater trochanter. There is a minimal amount of subcutaneous emphysema about the operative site. No radiopaque foreign body. IMPRESSION: Post ORIF of the left femur and femoral neck without evidence of complication. Electronically Signed By: Sandi Mariscal M.D. On: 09/20/2013 08:58  Dg Pelvis Portable  09/20/2013 CLINICAL DATA: Postoperative left femur EXAM: PORTABLE PELVIS 1-2 VIEWS COMPARISON: No preoperative films are available. FINDINGS: The patient has undergone placement of an intra medullary rod and telescoping screw within the left femur presumably for an intertrochanteric fracture. Positioning of the appliance is radiographically acceptable. IMPRESSION: There is no evidence of an immediate postprocedure complication following telescoping screw and intra medullary rod placement in the left femur. Electronically Signed By: David Martinique On: 09/20/2013 08:28  Dg C-arm 1-60 Min  09/20/2013 CLINICAL DATA: Post ORIF of the left femur EXAM: LEFT FEMUR - 2 VIEW; DG C-ARM 1-60 MIN COMPARISON: DG HIP COMPLETE*L* dated 09/18/2013 FINDINGS: Two spot intraoperative fluoroscopic images of the right hip are provided for review. Images demonstrate the sequela of intra medullary rod fixation of the proximal aspect of the left femur and dynamic screw fixation of the left femoral neck. Alignment appears improved though there is persistent displacement of the posterior aspect of the greater trochanter. There is a minimal amount of subcutaneous emphysema about the operative site. No radiopaque foreign body. IMPRESSION: Post ORIF of the left femur and femoral neck without evidence of complication. Electronically Signed By: Sandi Mariscal M.D. On: 09/20/2013 08:58    Assessment/Plan 1. Hip fracture -s/p intramedullary nail by Dr. Zada Finders f/u in 2 wks though none mentioned in d/c summary -increase pain control with morphine and d/c hydrocodone  2. Internal gastroileal fistula with functional short gut syndrome -change meds to help absorption  3. DM type 2, controlled, with complication -OMB5D was excellent with diet only  4. Anxiety state, unspecified Start ativan gel 0.5mg  topical qid prn back in place of pills due to her intestinal malabsorption  5. Malnutrition of moderate degree -doing better recently--appetite improved, but eats small amts at a time  6. Chronic pain d/c hydrocodone Increase morphine solution from 10mg  po q6 hrs prn pain to q 4 hrs prn pain  Functional status:  Dependent in bathing, dressing, needs help with meal setup  Family/ staff Communication: seen with unit supervisor  Labs/tests ordered:  F/u cbc, bmp next week due to hyponatremia during hospital stay and anemia of chronic disease

## 2013-10-06 ENCOUNTER — Encounter: Payer: Self-pay | Admitting: Internal Medicine

## 2013-10-06 ENCOUNTER — Non-Acute Institutional Stay (SKILLED_NURSING_FACILITY): Payer: Medicare Other | Admitting: Internal Medicine

## 2013-10-06 DIAGNOSIS — K2210 Ulcer of esophagus without bleeding: Secondary | ICD-10-CM

## 2013-10-06 DIAGNOSIS — I951 Orthostatic hypotension: Secondary | ICD-10-CM

## 2013-10-06 DIAGNOSIS — K208 Other esophagitis without bleeding: Secondary | ICD-10-CM

## 2013-10-06 DIAGNOSIS — R42 Dizziness and giddiness: Secondary | ICD-10-CM

## 2013-10-06 NOTE — Progress Notes (Signed)
Patient ID: [name], [name]   DOB: [birth year], 80 y.o.   MRN: [id number]    [name] living [employer]  Chief Complaint  Patient presents with  . Acute Visit    light headed, low blood pressure   Allergies  Allergen Reactions  . Other Swelling    Pinto beans cause swelling and hives  . Penicillins Swelling   HPI:   80 y/o [name] patient is a LTC resident of the facility. She was seen in her room today. She complaints of lightheadedness with change of position today. She denies any headache. She feels weak and tired. Her appetite is good. No nausea or vomiting. No fever. Staff have noticed some confusion this am  Review of Systems:  Constitutional: Negative for malaise and fatigue Respiratory: Negative for cough and shortness of breath.    Cardiovascular: Negative for chest pain.   Gastrointestinal: Negative for diarrhea, nausea, vomiting, abdominal pain and constipation.   Musculoskeletal: Negative for myalgias.   Skin: Negative for itching and rash Neurological: headaches is stable.      Past Medical History  Diagnosis Date  . Hypertension   . Reflux   . Right elbow pain     OTIF  . Dementia   . Depression   . Osteoarthritis   . Pancreatitis 11/2007    [facility]  . Angiomyolipoma of kidney     right  . Ulcerative esophagitis 12/05/2007    hx elevated gastrin, severe on EGD by Dr Gala Romney , h pylori negative  . Hiatal hernia   . S/P colonoscopy 2009    pt reports normal by Dr Arnoldo Morale  . Diabetes mellitus   . Anemia   . Hyponatremia   . Cellulitis   . Esophagitis   . Peptic ulcer   . Insomnia   . Sepsis(995.91)   . Respiratory failure   . Sepsis(995.91)   . Clostridium difficile infection   . Esophagitis   . Depression   . Encephalitis   . Urinary tract infection   . Bacteremia   . 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 Perforated prepyloric gastric ulcer s/p antrectomy with Billroth II reconstruction 01/30/2011  . Fracture  08/2013    left hip from fall   Current Outpatient Prescriptions on File Prior to Visit  Medication Sig Dispense Refill  . acetaminophen (TYLENOL) 500 MG tablet Take 500 mg by mouth every 6 (six) hours as needed for pain or fever.      Marland Kitchen aspirin EC 325 MG tablet Take 1 tablet (325 mg total) by mouth daily.  30 tablet  0  . butalbital-acetaminophen-caffeine (FIORICET, ESGIC) 50-325-40 MG per tablet Take 2 tablets by mouth every 8 (eight) hours as needed. For headache      . cholecalciferol (VITAMIN D) 1000 UNITS tablet Take 2,000 Units by mouth daily.      . cloNIDine (CATAPRES) 0.1 MG tablet Take 0.1 mg by mouth daily.      Marland Kitchen darifenacin (ENABLEX) 15 MG 24 hr tablet Take 15 mg by mouth daily.       Marland Kitchen gabapentin (NEURONTIN) 100 MG capsule Take 100 mg by mouth daily.      Marland Kitchen HYDROcodone-acetaminophen (NORCO) 5-325 MG per tablet Take 1-2 tablets by mouth every 6 (six) hours as needed for moderate pain.  60 tablet  0  . insulin aspart (NOVOLOG) 100 UNIT/ML injection Inject 5 Units into the skin 3 (three) times daily as needed. For cbg >150 take 5 units 15 minutes  before or 30 minutes after meal      . LORazepam (ATIVAN) 0.5 MG tablet Take 1 tablet (0.5 mg total) by mouth 4 (four) times daily.  120 tablet  5  . metoprolol (LOPRESSOR) 50 MG tablet Take 50 mg by mouth 2 (two) times daily.       Marland Kitchen morphine (ROXANOL) 20 MG/ML concentrated solution Take 0.15ml by mouth every 6 hours as needed for pain  120 mL  0  . Olopatadine HCl (PATADAY) 0.2 % SOLN Apply 1 drop to eye daily. Both eyes      . omeprazole (PRILOSEC) 20 MG capsule Take 20 mg by mouth 2 (two) times daily.      . ranitidine (ZANTAC) 150 MG tablet Take 150 mg by mouth daily.      . sertraline (ZOLOFT) 50 MG tablet Take 3 tablets (150 mg total) by mouth daily.  90 tablet  0   No current facility-administered medications on file prior to visit.   Past Surgical History  Procedure Laterality Date  . Orif right hip  1999    [facility]  . Umbilical  hernia repair  71 months old    [place]  . Esophagogastroduodenoscopy  01/28/2011    Procedure: ESOPHAGOGASTRODUODENOSCOPY (EGD);  Surgeon: Dorothyann Peng, MD;  [facility];  Service: Endoscopy;  Laterality: N/A;  . Colonoscopy  01/28/2011    Procedure: COLONOSCOPY;  Surgeon: Dorothyann Peng, MD;  [facility];  Service: Endoscopy;  Laterality: N/A;  . Laparotomy  02/04/2011    Procedure: EXPLORATORY LAPAROTOMY;  Surgeon: Donato Heinz;  Location: [facility];  Service: General;  Laterality: N/A;  . Laparotomy  05/07/2011    Procedure: EXPLORATORY LAPAROTOMY;  Surgeon: Adin Hector, MD;  Location: [place];  Service: General;  Laterality: N/A;  lysis of adhesions  . Gastrostomy  05/07/2011    Procedure: GASTROSTOMY;  Surgeon: Adin Hector, MD;  Location: [place];  Service: General;  Laterality: N/A;  g-tube / j -tube placement  . Gastrectomy  05/07/2011    Procedure: GASTRECTOMY;  Surgeon: Adin Hector, MD;  Location: [place];  Service: General;  Laterality: N/A;  Partial gastrectomy  . Cholecystectomy  05/07/2011    Procedure: CHOLECYSTECTOMY;  Surgeon: Adin Hector, MD;  Location: [place];  Service: General;  Laterality: N/A;  open  . Tee without cardioversion  10/21/2011    Procedure: TRANSESOPHAGEAL ECHOCARDIOGRAM (TEE);  Surgeon: Josue Hector, MD;  Location[facility] END[facility];  Service: Cardiovascular;  Laterality: N/A;  . Femur im nail Left 09/19/2013    Procedure: INTRAMEDULLARY (IM) NAIL FEMORAL;  Surgeon: Meredith Pel, MD;  Location: [place];  Service: Orthopedics;  Laterality: Left;    Physical exam BP 90/50  Pulse 68  Temp(Src) 97.6 F (36.4 C)  Resp 18  SpO2 95%  Orthostatics- lying down 110/58, sitting 100/59, standing 90/50  Constitutional: thin built, chronically ill appearing, no acute distress Mouth: moist oral mucosa Neck: Neck supple. No JVD present. No thyromegaly present.   Cardiovascular: Normal rate, regular rhythm and intact distal pulses.      Respiratory: Effort normal and breath sounds normal. No respiratory distress. She has no wheezes.   GI: Soft. Bowel sounds are normal. She exhibits no distension. There is no tenderness.  Musculoskeletal: Normal range of motion. She exhibits no edema. Uses her wheelchair Neurological: She is alert.   Skin: Skin is warm and dry.  Psychiatric: She has a normal mood and affect  Labs-  04-22-12:  vit D <10 06-01-12: wbc 5.9; hgb 10.7; hct 33.1; mcv 75.9 ;plt 422   07-01-12: glucose 101; bun 9; creat 0.48; k+ 4.3; na++ 137; alk phos 142; albumin 2.4; ca++ 8.0 hgb a1c 5.7   07-13-12: vit d 10   09-19-12: wbc 3.4; hgb 11.4; hct [name].5 ;mcv 87.1 ;plt 383   12-23-12:wbc 3.6; hgb 10.6; hct 32.3;mcv 85.4 plt 227; glucose 73; bun 8; creat 0.50; k+3.4;na++139 Liver normal albumin 2.6; hgb a1c 5.4   03-03-13: glucose 83; bun 7; creat 0.49; k+3.9; na++137    Assessment/Plan  ORTHOSTATIC HYPOTENSION Patient has orthostatic hypotension. Likely iatrogenic but given her hx of erosive esophagitis, will rule out acute bleed. On review of her bp readings, for past few weeks patient has had bp readings 94/55, 110/55, 100/50. Will hold her clonidine for now and decrease her metoprolol tartrate to 25 mg bid. Will have holding parameters. Check bp on daily basis for now. Check bmp to assess for hypovolemia. Will start her on normal saline 100 cc/hr x 1 litre and reassess bp reading  Esophagitis, erosive Will continue her omeprazole 20 mg bid and check cbc for now. On aspirin at present, will need to hold this if has low h/h  Dizziness Likely in setting of her hypotension. Will get lab work to assess for hypovolemia from blood loss and dehydration. Start iv fluids as above and rest for now  Spent more than 50 minutes in patient care  Labs- stat cbc, cmp, daily orthostatics

## 2013-10-24 ENCOUNTER — Non-Acute Institutional Stay (SKILLED_NURSING_FACILITY): Payer: Medicare Other | Admitting: Internal Medicine

## 2013-10-24 ENCOUNTER — Encounter: Payer: Self-pay | Admitting: Internal Medicine

## 2013-10-24 DIAGNOSIS — S72002D Fracture of unspecified part of neck of left femur, subsequent encounter for closed fracture with routine healing: Secondary | ICD-10-CM

## 2013-10-24 DIAGNOSIS — R197 Diarrhea, unspecified: Secondary | ICD-10-CM

## 2013-10-24 DIAGNOSIS — K208 Other esophagitis without bleeding: Secondary | ICD-10-CM

## 2013-10-24 DIAGNOSIS — K2210 Ulcer of esophagus without bleeding: Secondary | ICD-10-CM

## 2013-10-24 DIAGNOSIS — R627 Adult failure to thrive: Secondary | ICD-10-CM

## 2013-10-24 DIAGNOSIS — S72009D Fracture of unspecified part of neck of unspecified femur, subsequent encounter for closed fracture with routine healing: Secondary | ICD-10-CM

## 2013-10-24 NOTE — Progress Notes (Signed)
Patient ID: [name], female   DOB: Nov 29, 1931, 80 y.o.   MRN: [id number]  Location:  [facility] Provider:  Rexene Edison. Mariea Clonts, D.O., C.M.D.  Code Status:  DNR  Chief Complaint  Patient presents with  . Acute Visit    increased pain, restlessness, diarrhea after eating    HPI:  80 yo black female long term care resident with short gut syndrome, dementia, failure to thrive, chronic headaches, hypertension, diabetes was seen due to increased pain, restlessness and diarrhea after meals.  She previously had difficulty with diarrhea after meals before her hospitalization with hip fracture, but it had not been a problem transiently since her return.  Overall, she has had a decline.    Review of Systems:  Review of Systems  Constitutional: Positive for weight loss and malaise/fatigue. Negative for fever.  HENT: Negative for congestion.   Respiratory: Negative for shortness of breath.   Cardiovascular: Negative for chest pain and leg swelling.  Gastrointestinal: Positive for nausea, abdominal pain and diarrhea. Negative for vomiting.  Genitourinary: Negative for dysuria.  Musculoskeletal: Positive for falls and myalgias.  Skin: Negative for rash.  Neurological: Positive for weakness and headaches. Negative for loss of consciousness.  Psychiatric/Behavioral: Positive for depression and memory loss. The patient is nervous/anxious.     Medications: Patient's Medications  New Prescriptions   No medications on file  Previous Medications   ACETAMINOPHEN (TYLENOL) 500 MG TABLET    Take 500 mg by mouth every 6 (six) hours as needed for pain or fever.   ASPIRIN EC 325 MG TABLET    Take 1 tablet (325 mg total) by mouth daily.   BUTALBITAL-ACETAMINOPHEN-CAFFEINE (FIORICET, ESGIC) 50-325-40 MG PER TABLET    Take 2 tablets by mouth every 8 (eight) hours as needed. For headache   CHOLECALCIFEROL (VITAMIN D) 1000 UNITS TABLET    Take 2,000 Units by mouth daily.   CLONIDINE (CATAPRES) 0.1  MG TABLET    Take 0.1 mg by mouth daily.   DARIFENACIN (ENABLEX) 15 MG 24 HR TABLET    Take 15 mg by mouth daily.    GABAPENTIN (NEURONTIN) 100 MG CAPSULE    Take 100 mg by mouth daily.   HYDROCODONE-ACETAMINOPHEN (NORCO) 5-325 MG PER TABLET    Take 1-2 tablets by mouth every 6 (six) hours as needed for moderate pain.   INSULIN ASPART (NOVOLOG) 100 UNIT/ML INJECTION    Inject 5 Units into the skin 3 (three) times daily as needed. For cbg >150 take 5 units 15 minutes before or 30 minutes after meal   LORAZEPAM (ATIVAN) 0.5 MG TABLET    Take 0.5 mg by mouth 4 (four) times daily as needed for anxiety. Not oral--topical gel   METOPROLOL (LOPRESSOR) 50 MG TABLET    Take 25 mg by mouth 2 (two) times daily.    OLOPATADINE HCL (PATADAY) 0.2 % SOLN    Apply 1 drop to eye daily. Both eyes   OMEPRAZOLE (PRILOSEC) 20 MG CAPSULE    Take 20 mg by mouth 2 (two) times daily.   POTASSIUM CHLORIDE SA (K-DUR,KLOR-CON) 20 MEQ TABLET    Take 40 mEq by mouth 2 (two) times daily.   RANITIDINE (ZANTAC) 150 MG TABLET    Take 150 mg by mouth daily.   SERTRALINE (ZOLOFT) 50 MG TABLET    Take 3 tablets (150 mg total) by mouth daily.  Modified Medications   Modified Medication Previous Medication   MORPHINE (ROXANOL) 20 MG/ML CONCENTRATED SOLUTION morphine (ROXANOL) 20 MG/ML  concentrated solution      Take 10 mg by mouth every 4 (four) hours as needed for severe pain. Take 0.26ml by mouth every 6 hours as needed for pain    Take 0.59ml by mouth every 6 hours as needed for pain  Discontinued Medications   LORAZEPAM (ATIVAN) 0.5 MG TABLET    Take 1 tablet (0.5 mg total) by mouth 4 (four) times daily.    Physical Exam: Filed Vitals:   10/24/13 1424  BP: 118/49  Pulse: 67  Temp: 96.4 F (35.8 C)  Resp: 18  Height: 5\' 6"  (1.676 m)  Weight: 105 lb (47.628 kg)  SpO2: 97%  Physical Exam  Constitutional: No distress.  Cardiovascular: Normal rate, regular rhythm, normal heart sounds and intact distal pulses.     Pulmonary/Chest: Effort normal and breath sounds normal.  Abdominal: Soft. Bowel sounds are normal. She exhibits no distension and no mass. There is tenderness.  Musculoskeletal: Normal range of motion.  Neurological: She is alert.  Confused and anxious    Labs reviewed: Basic Metabolic Panel:  Recent Labs  09/20/13 0522 09/21/13 0535 09/22/13 0505  NA 129* 134* 135*  K 4.2 4.1 4.4  CL 91* 98 102  CO2 25 25 25   GLUCOSE 104* 99 99  BUN 4* 6 9  CREATININE 0.40* 0.49* 0.51  CALCIUM 7.7* 7.6* 7.4*  MG 1.4* 1.4* 2.1    Liver Function Tests:  Recent Labs  09/20/13 0522 09/21/13 0535 09/22/13 0505  AST 21 13 13   ALT 19 14 11   ALKPHOS 223* 198* 175*  BILITOT 1.1 0.6 0.4  PROT 6.0 5.7* 5.1*  ALBUMIN 2.5* 2.3* 2.0*    CBC:  Recent Labs  09/20/13 0522 09/21/13 0535 09/22/13 0505  WBC 5.8 4.4 3.6*  NEUTROABS 4.6 3.4 2.4  HGB 10.9* 10.6* 9.3*  HCT 33.3* 31.3* 28.2*  MCV 83.9 83.7 85.2  PLT 157 154 145*   Lab Results  Component Value Date   HGBA1C 5.5 09/18/2013    09/18/13:  Subtle linear lucency in left intertrochanteric femur suspicious of a non-displaced fracture with a greater trochanteric fragment.  No dislocation of joint.  Pubic rami intact. 10/06/13:  Wbc 5.2, h/h 10/30.9, plts 284 10/16/13:  Na 141, K 3.9, BUN 7, cr 0.54, Ca 7 Assessment/Plan 1. Hip fracture, left, closed, with routine healing, subsequent encounter restart hydrocodone apap 1 q 6 hrs prn severe pain  2. Diarrhea Cholestyramine 4g with meals at least one hour after meds and 4 hrs before next meds due to diarrhea for suspected pancreatic insufficiency--previously was on this with benefit  3. Erosive esophagitis -cont zantac, prilosec  4. Failure to thrive syndrome, adult -continues to progress, not helped by enablex which is probably not helping and will consider discontinuing in near future -consider reinitiating palliative care referral since return from hospital  Family/ staff  Communication: discussed with nursing staff and seen with unit supervisor  Goals of care: comfort measures, long term care  Labs/tests ordered: no new labs

## 2013-10-25 ENCOUNTER — Other Ambulatory Visit: Payer: Self-pay | Admitting: *Deleted

## 2013-10-25 MED ORDER — HYDROCODONE-ACETAMINOPHEN 5-325 MG PO TABS: ORAL_TABLET | ORAL | Status: AC

## 2013-10-25 NOTE — Telephone Encounter (Signed)
[facility] [place] 

## 2013-11-10 ENCOUNTER — Non-Acute Institutional Stay (SKILLED_NURSING_FACILITY): Payer: Medicare Other | Admitting: Internal Medicine

## 2013-11-10 DIAGNOSIS — R1012 Left upper quadrant pain: Secondary | ICD-10-CM

## 2013-11-10 DIAGNOSIS — R51 Headache: Secondary | ICD-10-CM

## 2013-11-10 NOTE — Progress Notes (Signed)
Patient ID: [name], female   DOB: 03-31-1932, 80 y.o.   MRN: [id number]    [name] living [employer] Chief Complaint  Patient presents with  . Acute Visit    abdominal pain and headache   Allergies  Allergen Reactions  . Other Swelling    Pinto beans cause swelling and hives  . Penicillins Swelling   HPI 80 y/o female pt is seen with complaints of headache since this am. She has frontal headache without radiation. Denies photophobia, nausea or vomiting. She also complaints of intermittent generalized abdominal pain. Today she has discomfort in left upper quadrant. She ate 105 of her lunch this afternoon. No loose stool or constipation. No urinary complaints.  ROS No fever or chills No chest pain or dyspnea Denies neck pain Poor po intake Has been losing weight  Past Medical History  Diagnosis Date  . Hypertension   . Reflux   . Right elbow pain     OTIF  . Dementia   . Depression   . Osteoarthritis   . Pancreatitis 11/2007    HOP  . Angiomyolipoma of kidney     right  . Ulcerative esophagitis 12/05/2007    hx elevated gastrin, severe on EGD by Dr Gala Romney , h pylori negative  . Hiatal hernia   . S/P colonoscopy 2009    pt reports normal by Dr Arnoldo Morale  . Diabetes mellitus   . Anemia   . Hyponatremia   . Cellulitis   . Esophagitis   . Peptic ulcer   . Insomnia   . Sepsis(995.91)   . Respiratory failure   . Sepsis(995.91)   . Clostridium difficile infection   . Esophagitis   . Depression   . Encephalitis   . Urinary tract infection   . Bacteremia   . 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 Perforated prepyloric gastric ulcer s/p antrectomy with Billroth II reconstruction 01/30/2011  . Fracture 08/2013    left hip from fall   Medication reviewed. See [employer]  Physical exam BP 117/74  Pulse 62  Temp(Src) 97.3 F (36.3 C)  Resp 18  Ht 5\' 6"  (1.676 m)  Wt 100 lb (45.36 kg)  BMI 16.15 kg/m2  SpO2 97%  Constitutional:  Thin built, in no acute distress HENT:   Head: Normocephalic and atraumatic. No temporal tenderness Mouth/Throat: Oropharynx is clear and moist.  Eyes: Conjunctivae and EOM are normal. Pupils are equal, round, and reactive to light.  Neck: Normal range of motion. Neck supple. No JVD present.  Cardiovascular: Normal rate, regular rhythm, normal heart sounds and intact distal pulses.   Pulmonary/Chest: Effort normal and breath sounds normal. No respiratory distress.  Abdominal: Soft. Bowel sounds are normal. She exhibits no distension and no mass. There is no tenderness on palpation Musculoskeletal: Normal range of motion.  Neurological: She is alert. Oriented to person and place only  Skin: Skin is warm and dry.  Psychiatric: anxious    Assessment/plan  Headache bp stable. Has hx of chronic headache. No feature on exam of temporal arteritis. Possible atypical migraine type of headache. Will try nortriptyline 10 mg daily for now and reassess. Will titrate this up if needed. D/c fioricet for now  Abdominal discomfort Unclear of the cause. No pain ilicted on palpation. With her hx of short gut syndrome, is on PPI and cholestyramine. Her surgical scars could also be contributing to this. There could be possible slow peristalsis causing pain as well. i will try her  on nortriptyline 10 mg daily for now and reassess.

## 2013-12-07 ENCOUNTER — Encounter: Payer: Self-pay | Admitting: Internal Medicine

## 2013-12-07 ENCOUNTER — Non-Acute Institutional Stay (SKILLED_NURSING_FACILITY): Payer: Medicare Other | Admitting: Internal Medicine

## 2013-12-07 DIAGNOSIS — F411 Generalized anxiety disorder: Secondary | ICD-10-CM

## 2013-12-07 DIAGNOSIS — E119 Type 2 diabetes mellitus without complications: Secondary | ICD-10-CM

## 2013-12-07 DIAGNOSIS — K2100 Gastro-esophageal reflux disease with esophagitis, without bleeding: Secondary | ICD-10-CM

## 2013-12-07 DIAGNOSIS — I10 Essential (primary) hypertension: Secondary | ICD-10-CM

## 2013-12-07 DIAGNOSIS — K912 Postsurgical malabsorption, not elsewhere classified: Secondary | ICD-10-CM

## 2013-12-07 DIAGNOSIS — E876 Hypokalemia: Secondary | ICD-10-CM

## 2013-12-07 NOTE — Progress Notes (Signed)
Patient ID: [name], female   DOB: 10-Dec-1931, 80 y.o.   MRN: [id number]   [facility]   Chief Complaint  Patient presents with  . Medical Management of Chronic Issues    No complaint verbalized   Allergies  Allergen Reactions  . Other Swelling    Pinto beans cause swelling and hives  . Penicillins Swelling   Code Status: DNR/Hospice   HPI:  80 y/o female patient is a LTC resident of the facility. She was seen in her room today. Currently symptom free. Her weight remains stable, appetite is good. No fall. No skin issue. No new concern from staff.   Review of Systems:  Constitutional: negative for malaise and fatigue  Respiratory: Negative for cough and shortness of breath.  Cardiovascular: Negative for chest pain.  Gastrointestinal: Negative for diarrhea, nausea, vomiting, abdominal pain and constipation.  Musculoskeletal: Negative for myalgias.  Skin: Negative.  Neurological: no new focal deficit, no dizziness   Past Medical History  Diagnosis Date  . Hypertension   . Reflux   . Right elbow pain     OTIF  . Dementia   . Depression   . Osteoarthritis   . Pancreatitis 11/2007    HOP  . Angiomyolipoma of kidney     right  . Ulcerative esophagitis 12/05/2007    hx elevated gastrin, severe on EGD by Dr Gala Romney , h pylori negative  . Hiatal hernia   . S/P colonoscopy 2009    pt reports normal by Dr Arnoldo Morale  . Diabetes mellitus   . Anemia   . Hyponatremia   . Cellulitis   . Esophagitis   . Peptic ulcer   . Insomnia   . Sepsis(995.91)   . Respiratory failure   . Sepsis(995.91)   . Clostridium difficile infection   . Esophagitis   . Depression   . Encephalitis   . Urinary tract infection   . Bacteremia   . 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 Perforated prepyloric gastric ulcer s/p antrectomy with Billroth II reconstruction 01/30/2011  . Fracture 08/2013    left hip from fall   Past Surgical History    Procedure Laterality Date  . Orif right hip  1999    [facility]  . Umbilical hernia repair  76 months old    [place]  . Esophagogastroduodenoscopy  01/28/2011    Procedure: ESOPHAGOGASTRODUODENOSCOPY (EGD);  Surgeon: Dorothyann Peng, MD;  [facility]: [facility];  Service: Endoscopy;  Laterality: N/A;  . Colonoscopy  01/28/2011    Procedure: COLONOSCOPY;  Surgeon: Dorothyann Peng, MD;  [facility];  Service: Endoscopy;  Laterality: N/A;  . Laparotomy  02/04/2011    Procedure: EXPLORATORY LAPAROTOMY;  Surgeon: Donato Heinz;  Location: [facility];  Service: General;  Laterality: N/A;  . Laparotomy  05/07/2011    Procedure: EXPLORATORY LAPAROTOMY;  Surgeon: Adin Hector, MD;  Location: [place];  Service: General;  Laterality: N/A;  lysis of adhesions  . Gastrostomy  05/07/2011    Procedure: GASTROSTOMY;  Surgeon: Adin Hector, MD;  Location: [place];  Service: General;  Laterality: N/A;  g-tube / j -tube placement  . Gastrectomy  05/07/2011    Procedure: GASTRECTOMY;  Surgeon: Adin Hector, MD;  Location: [place];  Service: General;  Laterality: N/A;  Partial gastrectomy  . Cholecystectomy  05/07/2011    Procedure: CHOLECYSTECTOMY;  Surgeon: Adin Hector, MD;  Location: [place];  Service: General;  Laterality: N/A;  open  . Tee without cardioversion  10/21/2011    Procedure: TRANSESOPHAGEAL ECHOCARDIOGRAM (TEE);  Surgeon: Josue Hector, MD;  Location: [facility] ENDOSCOPY;  Service: Cardiovascular;  Laterality: N/A;  . Femur im nail Left 09/19/2013    Procedure: INTRAMEDULLARY (IM) NAIL FEMORAL;  Surgeon: Meredith Pel, MD;  Location: [place];  Service: Orthopedics;  Laterality: Left;   Outpatient Encounter Prescriptions as of 12/07/2013  Medication Sig  . acetaminophen (TYLENOL) 325 MG tablet Take 650 mg by mouth every 4 (four) hours as needed for mild pain (Do not exceed $RemoveBe'4000mg'CEWrwsHrp$  in 24 hr).  . cholestyramine (QUESTRAN) 4 G packet Take 4 g by mouth 3 (three) times daily with meals. Mix with 2-6oz  water or other noncarbonated beverage or applesauce  . nortriptyline (PAMELOR) 10 MG capsule Take 10 mg by mouth at bedtime.  Marland Kitchen aspirin EC 325 MG tablet Take 1 tablet (325 mg total) by mouth daily.  . cholecalciferol (VITAMIN D) 1000 UNITS tablet Take 2,000 Units by mouth daily.  Marland Kitchen darifenacin (ENABLEX) 15 MG 24 hr tablet Take 15 mg by mouth daily.   Marland Kitchen gabapentin (NEURONTIN) 100 MG capsule Take 100 mg by mouth daily.  Marland Kitchen HYDROcodone-acetaminophen (NORCO) 5-325 MG per tablet Take one tablet by mouth every 6 hours as needed for severe pain  . insulin aspart (NOVOLOG) 100 UNIT/ML injection Inject 5 Units into the skin 3 (three) times daily as needed. For cbg >150 take 5 units 15 minutes before or 30 minutes after meal  . LORazepam (ATIVAN) 0.5 MG tablet Take 0.5 mg by mouth 4 (four) times daily as needed for anxiety. Not oral--topical gel  . metoprolol (LOPRESSOR) 50 MG tablet Take 25 mg by mouth 2 (two) times daily.   Marland Kitchen morphine (ROXANOL) 20 MG/ML concentrated solution Take 10 mg by mouth every 4 (four) hours as needed for severe pain. Take 0.44ml by mouth every 6 hours as needed for pain  . Olopatadine HCl (PATADAY) 0.2 % SOLN Apply 1 drop to eye daily. Both eyes  . omeprazole (PRILOSEC) 20 MG capsule Take 20 mg by mouth 2 (two) times daily.  . potassium chloride SA (K-DUR,KLOR-CON) 20 MEQ tablet Take 40 mEq by mouth 2 (two) times daily.  . ranitidine (ZANTAC) 150 MG tablet Take 150 mg by mouth daily.  . sertraline (ZOLOFT) 50 MG tablet Take 3 tablets (150 mg total) by mouth daily.  . [DISCONTINUED] acetaminophen (TYLENOL) 500 MG tablet Take 650 mg by mouth every 4 (four) hours as needed for fever.   . [DISCONTINUED] butalbital-acetaminophen-caffeine (FIORICET, ESGIC) 50-325-40 MG per tablet Take 2 tablets by mouth every 8 (eight) hours as needed. For headache  . [DISCONTINUED] cloNIDine (CATAPRES) 0.1 MG tablet Take 0.1 mg by mouth daily.   Physical exam:  BP 106/54  Pulse 83  Temp(Src) 98.8 F  (37.1 C)  Resp 16  Ht $R'5\' 6"'Le$  (1.676 m)  Wt 98 lb (44.453 kg)  BMI 15.83 kg/m2  SpO2 97%  Constitutional: Frail elderly female in NAD.  Neck: Neck supple. No JVD present. No thyromegaly present.  Cardiovascular: Normal rate, regular rhythm and intact distal pulses.  Respiratory: Effort normal and breath sounds normal. No respiratory distress. She has no wheezes.  GI: Soft. Bowel sounds are normal. She exhibits no distension. There is no tenderness.  Musculoskeletal: Normal range of motion. No tenderness on exam. Trace BLE pitting edema Neurological: She is alert.  Skin: Skin is warm and dry.  Psychiatric: She has a normal mood  and affect   Labs:  04-22-12: vit D <10  06-01-12: wbc 5.9; hgb 10.7; hct 33.1; mcv 75.9 ;plt 422  07-01-12: glucose 101; bun 9; creat 0.48; k+ 4.3; na++ 137; alk phos 142; albumin 2.4; ca++ 8.0  hgb a1c 5.7  07-13-12: vit d 10  09-19-12: wbc 3.4; hgb 11.4; hct 34.5 ;mcv 87.1 ;plt 383  12-23-12:wbc 3.6; hgb 10.6; hct 32.3;mcv 85.4 plt 227; glucose 73; bun 8; creat 0.50; k+3.4;na++139  Liver normal albumin 2.6; hgb a1c 5.4  03-03-13: glucose 83; bun 7; creat 0.49; k+3.9; na++137  07-10-13 a1c 5.4  10-06-13: Na 139, K+ 2.9, Cl 106, Alk phos 300, albumin 2.4, Ca++ 7.2, BUN 12, Cr 0.70 10-06-13: WBC 5.2, Hgb 10.0, Hct 30.9, MCV 91.4, Plt 284  Assessment/Plan:  1. Hypokalemia DecreasePotassium Chloride to 34mEQ daily. Repeat Bmet. Reassess   2. Unspecified essential hypertension BP is borderline low. Decrease metoprolol tartrate to 12.$RemoveBefore'5mg'[id number]$  PO BID. Monitor bp  3. Reflux esophagitis Currently symptom free, d/c omeprazole. continue ranitidine 150 mg daily. Reassess   4. Anxiety state, unspecified Stable currently, continue standing regimen of ativan and sertraline   5. Type II or unspecified type diabetes mellitus without mention of complication, not stated as uncontrolled Last Hbg A1C is 5.4 onn 07-10-13. Most recent blood glucose reading is 143. D/c Insulin and blood  glucose monitoring.   6. Short gut syndrome Weight has been stable. Continue morphine standing order and nortriptyline $RemoveBeforeDEI'10mg'[id number]$  PO QD.   Plan of care discuss with nursing staff. Nursing staff verbalize understanding and agree with POC

## 2014-02-27 DEATH — deceased
